# Patient Record
Sex: Male | Born: 1950
Health system: Southern US, Community
[De-identification: ages and names within clinical notes are randomized; demographics above are authoritative.]

## PROBLEM LIST (undated history)

## (undated) DIAGNOSIS — M79609 Pain in unspecified limb: Secondary | ICD-10-CM

## (undated) DIAGNOSIS — D689 Coagulation defect, unspecified: Secondary | ICD-10-CM

## (undated) DIAGNOSIS — L989 Disorder of the skin and subcutaneous tissue, unspecified: Secondary | ICD-10-CM

## (undated) DIAGNOSIS — Z21 Asymptomatic human immunodeficiency virus [HIV] infection status: Secondary | ICD-10-CM

## (undated) DIAGNOSIS — K219 Gastro-esophageal reflux disease without esophagitis: Secondary | ICD-10-CM

## (undated) DIAGNOSIS — G35 Multiple sclerosis: Secondary | ICD-10-CM

## (undated) DIAGNOSIS — A4902 Methicillin resistant Staphylococcus aureus infection, unspecified site: Secondary | ICD-10-CM

## (undated) DIAGNOSIS — N390 Urinary tract infection, site not specified: Secondary | ICD-10-CM

## (undated) DIAGNOSIS — Z8619 Personal history of other infectious and parasitic diseases: Secondary | ICD-10-CM

## (undated) DIAGNOSIS — I739 Peripheral vascular disease, unspecified: Secondary | ICD-10-CM

## (undated) DIAGNOSIS — J189 Pneumonia, unspecified organism: Secondary | ICD-10-CM

## (undated) DIAGNOSIS — K635 Polyp of colon: Secondary | ICD-10-CM

## (undated) DIAGNOSIS — F32A Depression, unspecified: Secondary | ICD-10-CM

## (undated) DIAGNOSIS — J45909 Unspecified asthma, uncomplicated: Secondary | ICD-10-CM

## (undated) DIAGNOSIS — K8689 Other specified diseases of pancreas: Secondary | ICD-10-CM

## (undated) DIAGNOSIS — G35D Multiple sclerosis, unspecified: Secondary | ICD-10-CM

## (undated) DIAGNOSIS — D759 Disease of blood and blood-forming organs, unspecified: Secondary | ICD-10-CM

## (undated) DIAGNOSIS — IMO0002 Reserved for concepts with insufficient information to code with codable children: Secondary | ICD-10-CM

## (undated) DIAGNOSIS — I6529 Occlusion and stenosis of unspecified carotid artery: Secondary | ICD-10-CM

## (undated) DIAGNOSIS — B182 Chronic viral hepatitis C: Secondary | ICD-10-CM

## (undated) DIAGNOSIS — E785 Hyperlipidemia, unspecified: Secondary | ICD-10-CM

## (undated) DIAGNOSIS — F329 Major depressive disorder, single episode, unspecified: Secondary | ICD-10-CM

## (undated) DIAGNOSIS — Z972 Presence of dental prosthetic device (complete) (partial): Secondary | ICD-10-CM

## (undated) DIAGNOSIS — B59 Pneumocystosis: Secondary | ICD-10-CM

## (undated) DIAGNOSIS — R220 Localized swelling, mass and lump, head: Secondary | ICD-10-CM

## (undated) DIAGNOSIS — I10 Essential (primary) hypertension: Secondary | ICD-10-CM

## (undated) DIAGNOSIS — B20 Human immunodeficiency virus [HIV] disease: Secondary | ICD-10-CM

## (undated) DIAGNOSIS — R509 Fever, unspecified: Secondary | ICD-10-CM

## (undated) HISTORY — DX: Pneumocystosis: B59

## (undated) HISTORY — DX: Localized swelling, mass and lump, head: R22.0

## (undated) HISTORY — DX: Major depressive disorder, single episode, unspecified: F32.9

## (undated) HISTORY — DX: Multiple sclerosis: G35

## (undated) HISTORY — DX: Other specified diseases of pancreas: K86.89

## (undated) HISTORY — DX: Disorder of the skin and subcutaneous tissue, unspecified: L98.9

## (undated) HISTORY — PX: OTHER SURGICAL HISTORY: SHX169

## (undated) HISTORY — DX: Methicillin resistant Staphylococcus aureus infection, unspecified site: A49.02

## (undated) HISTORY — DX: Essential (primary) hypertension: I10

## (undated) HISTORY — DX: Chronic viral hepatitis C: B18.2

## (undated) HISTORY — DX: Peripheral vascular disease, unspecified: I73.9

## (undated) HISTORY — DX: Asymptomatic human immunodeficiency virus (hiv) infection status: Z21

## (undated) HISTORY — DX: Personal history of other infectious and parasitic diseases: Z86.19

## (undated) HISTORY — DX: Polyp of colon: K63.5

## (undated) HISTORY — DX: Hyperlipidemia, unspecified: E78.5

## (undated) HISTORY — PX: ABOVE KNEE LEG AMPUTATION: SUR20

## (undated) HISTORY — DX: Gastro-esophageal reflux disease without esophagitis: K21.9

## (undated) HISTORY — DX: Depression, unspecified: F32.A

## (undated) HISTORY — DX: Urinary tract infection, site not specified: N39.0

## (undated) HISTORY — PX: COLONOSCOPY W/ BIOPSIES AND POLYPECTOMY: SHX1376

## (undated) HISTORY — DX: Pneumonia, unspecified organism: J18.9

## (undated) HISTORY — DX: Pain in unspecified limb: M79.609

## (undated) HISTORY — DX: Multiple sclerosis, unspecified: G35.D

## (undated) HISTORY — DX: Reserved for concepts with insufficient information to code with codable children: IMO0002

## (undated) HISTORY — DX: Human immunodeficiency virus (HIV) disease: B20

## (undated) HISTORY — DX: Coagulation defect, unspecified: D68.9

## (undated) HISTORY — DX: Unspecified asthma, uncomplicated: J45.909

## (undated) HISTORY — DX: Fever, unspecified: R50.9

## (undated) HISTORY — PX: MULTIPLE TOOTH EXTRACTIONS: SHX2053

## (undated) HISTORY — DX: Occlusion and stenosis of unspecified carotid artery: I65.29

---

## 1992-07-06 ENCOUNTER — Encounter (INDEPENDENT_AMBULATORY_CARE_PROVIDER_SITE_OTHER): Payer: Self-pay | Admitting: *Deleted

## 1997-08-14 ENCOUNTER — Emergency Department (HOSPITAL_COMMUNITY): Admission: EM | Admit: 1997-08-14 | Discharge: 1997-08-14 | Payer: Self-pay | Admitting: Emergency Medicine

## 1997-08-19 ENCOUNTER — Encounter: Admission: RE | Admit: 1997-08-19 | Discharge: 1997-08-19 | Payer: Self-pay | Admitting: Infectious Diseases

## 1997-11-19 ENCOUNTER — Encounter: Admission: RE | Admit: 1997-11-19 | Discharge: 1997-11-19 | Payer: Self-pay | Admitting: Hematology and Oncology

## 1998-02-09 ENCOUNTER — Ambulatory Visit (HOSPITAL_COMMUNITY): Admission: RE | Admit: 1998-02-09 | Discharge: 1998-02-09 | Payer: Self-pay | Admitting: Infectious Diseases

## 1998-04-12 ENCOUNTER — Encounter: Admission: RE | Admit: 1998-04-12 | Discharge: 1998-04-12 | Payer: Self-pay | Admitting: Infectious Diseases

## 1998-08-11 ENCOUNTER — Encounter: Admission: RE | Admit: 1998-08-11 | Discharge: 1998-08-11 | Payer: Self-pay | Admitting: Infectious Diseases

## 1998-10-06 ENCOUNTER — Encounter: Admission: RE | Admit: 1998-10-06 | Discharge: 1998-10-06 | Payer: Self-pay | Admitting: Infectious Diseases

## 1998-10-27 ENCOUNTER — Encounter: Admission: RE | Admit: 1998-10-27 | Discharge: 1998-10-27 | Payer: Self-pay | Admitting: Infectious Diseases

## 1998-10-27 ENCOUNTER — Encounter: Payer: Self-pay | Admitting: Infectious Diseases

## 1998-10-27 ENCOUNTER — Ambulatory Visit (HOSPITAL_COMMUNITY): Admission: RE | Admit: 1998-10-27 | Discharge: 1998-10-27 | Payer: Self-pay | Admitting: Infectious Diseases

## 1998-11-04 ENCOUNTER — Ambulatory Visit (HOSPITAL_COMMUNITY): Admission: RE | Admit: 1998-11-04 | Discharge: 1998-11-04 | Payer: Self-pay | Admitting: Gastroenterology

## 1998-11-04 ENCOUNTER — Encounter: Payer: Self-pay | Admitting: Gastroenterology

## 1998-12-01 ENCOUNTER — Encounter: Admission: RE | Admit: 1998-12-01 | Discharge: 1998-12-01 | Payer: Self-pay | Admitting: Infectious Diseases

## 1998-12-27 ENCOUNTER — Encounter: Admission: RE | Admit: 1998-12-27 | Discharge: 1998-12-27 | Payer: Self-pay | Admitting: Infectious Diseases

## 1998-12-27 ENCOUNTER — Inpatient Hospital Stay (HOSPITAL_COMMUNITY): Admission: AD | Admit: 1998-12-27 | Discharge: 1999-01-03 | Payer: Self-pay | Admitting: Internal Medicine

## 1998-12-27 ENCOUNTER — Encounter: Payer: Self-pay | Admitting: Internal Medicine

## 1999-01-17 ENCOUNTER — Emergency Department (HOSPITAL_COMMUNITY): Admission: EM | Admit: 1999-01-17 | Discharge: 1999-01-17 | Payer: Self-pay | Admitting: Emergency Medicine

## 1999-01-17 ENCOUNTER — Encounter: Payer: Self-pay | Admitting: Emergency Medicine

## 1999-02-04 ENCOUNTER — Encounter: Admission: RE | Admit: 1999-02-04 | Discharge: 1999-02-04 | Payer: Self-pay | Admitting: Hematology and Oncology

## 1999-02-07 ENCOUNTER — Encounter: Admission: RE | Admit: 1999-02-07 | Discharge: 1999-02-07 | Payer: Self-pay | Admitting: Infectious Diseases

## 1999-02-09 ENCOUNTER — Encounter: Admission: RE | Admit: 1999-02-09 | Discharge: 1999-02-09 | Payer: Self-pay | Admitting: Infectious Diseases

## 1999-02-15 ENCOUNTER — Encounter: Admission: RE | Admit: 1999-02-15 | Discharge: 1999-02-15 | Payer: Self-pay | Admitting: Infectious Diseases

## 1999-02-28 ENCOUNTER — Encounter: Admission: RE | Admit: 1999-02-28 | Discharge: 1999-02-28 | Payer: Self-pay | Admitting: Infectious Diseases

## 1999-03-30 ENCOUNTER — Encounter: Admission: RE | Admit: 1999-03-30 | Discharge: 1999-03-30 | Payer: Self-pay | Admitting: Infectious Diseases

## 1999-03-30 ENCOUNTER — Ambulatory Visit (HOSPITAL_COMMUNITY): Admission: RE | Admit: 1999-03-30 | Discharge: 1999-03-30 | Payer: Self-pay | Admitting: Infectious Diseases

## 1999-04-13 ENCOUNTER — Encounter: Admission: RE | Admit: 1999-04-13 | Discharge: 1999-04-13 | Payer: Self-pay | Admitting: Infectious Diseases

## 1999-05-16 ENCOUNTER — Encounter: Admission: RE | Admit: 1999-05-16 | Discharge: 1999-05-16 | Payer: Self-pay | Admitting: Infectious Diseases

## 1999-05-16 ENCOUNTER — Ambulatory Visit (HOSPITAL_COMMUNITY): Admission: RE | Admit: 1999-05-16 | Discharge: 1999-05-16 | Payer: Self-pay | Admitting: Infectious Diseases

## 1999-06-13 ENCOUNTER — Encounter: Admission: RE | Admit: 1999-06-13 | Discharge: 1999-06-13 | Payer: Self-pay | Admitting: Infectious Diseases

## 2005-03-27 ENCOUNTER — Ambulatory Visit (HOSPITAL_COMMUNITY): Admission: RE | Admit: 2005-03-27 | Discharge: 2005-03-27 | Payer: Self-pay | Admitting: Infectious Diseases

## 2005-03-27 ENCOUNTER — Ambulatory Visit: Payer: Self-pay | Admitting: Infectious Diseases

## 2005-04-10 ENCOUNTER — Ambulatory Visit: Payer: Self-pay | Admitting: Infectious Diseases

## 2005-06-21 ENCOUNTER — Ambulatory Visit: Payer: Self-pay | Admitting: Infectious Diseases

## 2005-06-21 ENCOUNTER — Encounter (INDEPENDENT_AMBULATORY_CARE_PROVIDER_SITE_OTHER): Payer: Self-pay | Admitting: *Deleted

## 2005-07-24 ENCOUNTER — Ambulatory Visit: Payer: Self-pay | Admitting: Infectious Diseases

## 2005-09-12 ENCOUNTER — Ambulatory Visit: Payer: Self-pay | Admitting: Infectious Diseases

## 2005-10-16 ENCOUNTER — Ambulatory Visit: Payer: Self-pay | Admitting: Infectious Diseases

## 2006-01-10 ENCOUNTER — Encounter (INDEPENDENT_AMBULATORY_CARE_PROVIDER_SITE_OTHER): Payer: Self-pay | Admitting: *Deleted

## 2006-01-10 ENCOUNTER — Ambulatory Visit: Payer: Self-pay | Admitting: Infectious Diseases

## 2006-01-10 LAB — CONVERTED CEMR LAB: CD4 Count: 500 microliters

## 2006-02-12 ENCOUNTER — Ambulatory Visit: Payer: Self-pay | Admitting: Infectious Diseases

## 2006-02-12 DIAGNOSIS — B20 Human immunodeficiency virus [HIV] disease: Secondary | ICD-10-CM | POA: Insufficient documentation

## 2006-02-12 DIAGNOSIS — D126 Benign neoplasm of colon, unspecified: Secondary | ICD-10-CM

## 2006-02-12 DIAGNOSIS — I739 Peripheral vascular disease, unspecified: Secondary | ICD-10-CM | POA: Insufficient documentation

## 2006-02-12 DIAGNOSIS — K219 Gastro-esophageal reflux disease without esophagitis: Secondary | ICD-10-CM | POA: Insufficient documentation

## 2006-02-12 DIAGNOSIS — G35 Multiple sclerosis: Secondary | ICD-10-CM

## 2006-02-12 DIAGNOSIS — G35D Multiple sclerosis, unspecified: Secondary | ICD-10-CM

## 2006-02-12 DIAGNOSIS — R768 Other specified abnormal immunological findings in serum: Secondary | ICD-10-CM

## 2006-02-12 HISTORY — DX: Multiple sclerosis, unspecified: G35.D

## 2006-04-12 ENCOUNTER — Encounter (INDEPENDENT_AMBULATORY_CARE_PROVIDER_SITE_OTHER): Payer: Self-pay | Admitting: Infectious Diseases

## 2006-06-01 ENCOUNTER — Ambulatory Visit: Payer: Self-pay | Admitting: Infectious Diseases

## 2006-06-01 ENCOUNTER — Encounter (INDEPENDENT_AMBULATORY_CARE_PROVIDER_SITE_OTHER): Payer: Self-pay | Admitting: *Deleted

## 2006-06-01 ENCOUNTER — Encounter: Admission: RE | Admit: 2006-06-01 | Discharge: 2006-06-01 | Payer: Self-pay | Admitting: Infectious Diseases

## 2006-06-01 LAB — CONVERTED CEMR LAB
ALT: 15 units/L (ref 0–53)
Albumin: 4.7 g/dL (ref 3.5–5.2)
Alkaline Phosphatase: 79 units/L (ref 39–117)
BUN: 10 mg/dL (ref 6–23)
Calcium: 9.6 mg/dL (ref 8.4–10.5)
Eosinophils Relative: 3 % (ref 0–5)
HCT: 43.9 % (ref 39.0–52.0)
HDL: 42 mg/dL (ref >39)
HIV 1 RNA Quant: <50 copies/mL (ref <50)
MCV: 114.6 fL — ABNORMAL HIGH (ref 78.0–100.0)
Monocytes Absolute: 0.5 10*3/uL (ref 0.2–0.7)
Neutro Abs: 4.9 10*3/uL (ref 1.7–7.7)
Neutrophils Relative %: 61 % (ref 43–77)
Potassium: 4.2 meq/L (ref 3.5–5.3)
RBC: 3.83 M/uL — ABNORMAL LOW (ref 4.22–5.81)
Sodium: 140 meq/L (ref 135–145)
Total CHOL/HDL Ratio: 5.7
Total Protein: 7 g/dL (ref 6.0–8.3)
Triglycerides: 184 mg/dL — ABNORMAL HIGH (ref <150)
VLDL: 37 mg/dL (ref 0–40)
WBC: 8 10*3/uL (ref 4.0–10.5)

## 2006-07-02 ENCOUNTER — Encounter (INDEPENDENT_AMBULATORY_CARE_PROVIDER_SITE_OTHER): Payer: Self-pay | Admitting: *Deleted

## 2006-07-02 LAB — CONVERTED CEMR LAB

## 2006-07-15 ENCOUNTER — Encounter (INDEPENDENT_AMBULATORY_CARE_PROVIDER_SITE_OTHER): Payer: Self-pay | Admitting: *Deleted

## 2006-07-16 ENCOUNTER — Ambulatory Visit: Payer: Self-pay | Admitting: Infectious Diseases

## 2006-08-27 ENCOUNTER — Ambulatory Visit: Payer: Self-pay | Admitting: Infectious Diseases

## 2006-10-22 ENCOUNTER — Ambulatory Visit: Payer: Self-pay | Admitting: Infectious Diseases

## 2006-12-19 ENCOUNTER — Ambulatory Visit: Payer: Self-pay | Admitting: Infectious Diseases

## 2006-12-19 ENCOUNTER — Encounter: Payer: Self-pay | Admitting: Infectious Disease

## 2006-12-19 LAB — CONVERTED CEMR LAB
ALT: 14 units/L (ref 0–53)
Albumin: 4.6 g/dL (ref 3.5–5.2)
Cholesterol: 206 mg/dL — ABNORMAL HIGH (ref 0–200)
Creatinine, Ser: 0.89 mg/dL (ref 0.40–1.50)
Glucose, Bld: 122 mg/dL — ABNORMAL HIGH (ref 70–99)
HDL: 40 mg/dL (ref >39)
LDL Cholesterol: 123 mg/dL — ABNORMAL HIGH (ref 0–99)
Total Bilirubin: 0.5 mg/dL (ref 0.3–1.2)
Total Protein: 7 g/dL (ref 6.0–8.3)
Triglycerides: 217 mg/dL — ABNORMAL HIGH (ref <150)
VLDL: 43 mg/dL — ABNORMAL HIGH (ref 0–40)

## 2006-12-27 ENCOUNTER — Encounter: Payer: Self-pay | Admitting: Infectious Disease

## 2006-12-27 LAB — CONVERTED CEMR LAB: CD4 Count: 529 microliters

## 2007-01-21 ENCOUNTER — Telehealth: Payer: Self-pay | Admitting: Infectious Disease

## 2007-01-23 ENCOUNTER — Encounter: Payer: Self-pay | Admitting: Infectious Disease

## 2007-01-23 ENCOUNTER — Ambulatory Visit: Payer: Self-pay | Admitting: Internal Medicine

## 2007-01-23 DIAGNOSIS — M79609 Pain in unspecified limb: Secondary | ICD-10-CM

## 2007-01-30 ENCOUNTER — Telehealth: Payer: Self-pay | Admitting: Internal Medicine

## 2007-01-30 ENCOUNTER — Encounter: Payer: Self-pay | Admitting: Internal Medicine

## 2007-01-30 ENCOUNTER — Ambulatory Visit (HOSPITAL_COMMUNITY): Admission: RE | Admit: 2007-01-30 | Discharge: 2007-01-30 | Payer: Self-pay | Admitting: Internal Medicine

## 2007-01-30 ENCOUNTER — Ambulatory Visit: Payer: Self-pay | Admitting: *Deleted

## 2007-01-31 ENCOUNTER — Telehealth (INDEPENDENT_AMBULATORY_CARE_PROVIDER_SITE_OTHER): Payer: Self-pay | Admitting: *Deleted

## 2007-02-08 ENCOUNTER — Ambulatory Visit: Payer: Self-pay | Admitting: Vascular Surgery

## 2007-02-08 ENCOUNTER — Encounter: Payer: Self-pay | Admitting: Internal Medicine

## 2007-02-12 ENCOUNTER — Ambulatory Visit (HOSPITAL_COMMUNITY): Admission: RE | Admit: 2007-02-12 | Discharge: 2007-02-12 | Payer: Self-pay | Admitting: Surgery

## 2007-02-18 ENCOUNTER — Ambulatory Visit: Payer: Self-pay | Admitting: Infectious Disease

## 2007-02-18 ENCOUNTER — Ambulatory Visit: Payer: Self-pay | Admitting: Surgery

## 2007-02-18 DIAGNOSIS — L738 Other specified follicular disorders: Secondary | ICD-10-CM | POA: Insufficient documentation

## 2007-02-22 ENCOUNTER — Ambulatory Visit: Payer: Self-pay | Admitting: Vascular Surgery

## 2007-02-26 ENCOUNTER — Encounter: Payer: Self-pay | Admitting: Vascular Surgery

## 2007-02-26 ENCOUNTER — Inpatient Hospital Stay (HOSPITAL_COMMUNITY): Admission: RE | Admit: 2007-02-26 | Discharge: 2007-03-01 | Payer: Self-pay | Admitting: Vascular Surgery

## 2007-02-27 ENCOUNTER — Ambulatory Visit: Payer: Self-pay | Admitting: Vascular Surgery

## 2007-03-15 ENCOUNTER — Ambulatory Visit: Payer: Self-pay | Admitting: Vascular Surgery

## 2007-03-22 ENCOUNTER — Ambulatory Visit: Payer: Self-pay | Admitting: Vascular Surgery

## 2007-04-26 ENCOUNTER — Encounter: Payer: Self-pay | Admitting: Internal Medicine

## 2007-04-26 ENCOUNTER — Ambulatory Visit: Payer: Self-pay | Admitting: Vascular Surgery

## 2007-05-07 ENCOUNTER — Encounter: Payer: Self-pay | Admitting: Internal Medicine

## 2007-05-07 ENCOUNTER — Encounter (INDEPENDENT_AMBULATORY_CARE_PROVIDER_SITE_OTHER): Payer: Self-pay | Admitting: *Deleted

## 2007-05-09 HISTORY — PX: ABOVE KNEE LEG AMPUTATION: SUR20

## 2007-05-20 ENCOUNTER — Ambulatory Visit: Payer: Self-pay | Admitting: Surgery

## 2007-05-27 ENCOUNTER — Encounter: Payer: Self-pay | Admitting: Infectious Disease

## 2007-05-27 ENCOUNTER — Ambulatory Visit: Payer: Self-pay | Admitting: Infectious Diseases

## 2007-05-27 LAB — CONVERTED CEMR LAB
Basophils Absolute: 0 10*3/uL (ref 0.0–0.1)
Basophils Relative: 1 % (ref 0–1)
Calcium: 9.7 mg/dL (ref 8.4–10.5)
Eosinophils Relative: 3 % (ref 0–5)
LDL Cholesterol: 122 mg/dL — ABNORMAL HIGH (ref 0–99)
Lymphocytes Relative: 44 % (ref 12–46)
MCV: 116.1 fL — ABNORMAL HIGH (ref 78.0–100.0)
Monocytes Absolute: 0.6 10*3/uL (ref 0.1–1.0)
Neutrophils Relative %: 42 % — ABNORMAL LOW (ref 43–77)
Platelets: 207 10*3/uL (ref 150–400)
RBC: 3.84 M/uL — ABNORMAL LOW (ref 4.22–5.81)
Total CHOL/HDL Ratio: 4.8
Triglycerides: 155 mg/dL — ABNORMAL HIGH (ref <150)

## 2007-07-02 ENCOUNTER — Ambulatory Visit: Payer: Self-pay | Admitting: Vascular Surgery

## 2007-07-02 ENCOUNTER — Encounter: Payer: Self-pay | Admitting: Internal Medicine

## 2007-07-02 ENCOUNTER — Ambulatory Visit: Payer: Self-pay | Admitting: Infectious Disease

## 2007-07-03 ENCOUNTER — Encounter: Payer: Self-pay | Admitting: Internal Medicine

## 2007-07-29 ENCOUNTER — Encounter (INDEPENDENT_AMBULATORY_CARE_PROVIDER_SITE_OTHER): Payer: Self-pay | Admitting: *Deleted

## 2007-07-29 ENCOUNTER — Ambulatory Visit: Payer: Self-pay | Admitting: Infectious Disease

## 2007-07-29 DIAGNOSIS — E785 Hyperlipidemia, unspecified: Secondary | ICD-10-CM

## 2007-07-29 DIAGNOSIS — E1169 Type 2 diabetes mellitus with other specified complication: Secondary | ICD-10-CM | POA: Insufficient documentation

## 2007-08-01 ENCOUNTER — Encounter (INDEPENDENT_AMBULATORY_CARE_PROVIDER_SITE_OTHER): Payer: Self-pay | Admitting: *Deleted

## 2007-09-17 ENCOUNTER — Telehealth (INDEPENDENT_AMBULATORY_CARE_PROVIDER_SITE_OTHER): Payer: Self-pay | Admitting: *Deleted

## 2007-11-01 ENCOUNTER — Ambulatory Visit: Payer: Self-pay | Admitting: Vascular Surgery

## 2007-11-04 ENCOUNTER — Ambulatory Visit: Payer: Self-pay | Admitting: Surgery

## 2007-11-06 ENCOUNTER — Inpatient Hospital Stay (HOSPITAL_COMMUNITY): Admission: RE | Admit: 2007-11-06 | Discharge: 2007-12-04 | Payer: Self-pay | Admitting: Surgery

## 2007-11-06 ENCOUNTER — Ambulatory Visit: Payer: Self-pay | Admitting: Surgery

## 2007-11-11 ENCOUNTER — Encounter: Payer: Self-pay | Admitting: Surgery

## 2007-11-13 ENCOUNTER — Encounter: Payer: Self-pay | Admitting: Surgery

## 2007-11-14 ENCOUNTER — Encounter: Payer: Self-pay | Admitting: Surgery

## 2007-11-15 ENCOUNTER — Encounter: Payer: Self-pay | Admitting: Surgery

## 2007-11-22 ENCOUNTER — Encounter: Payer: Self-pay | Admitting: Surgery

## 2007-11-25 ENCOUNTER — Ambulatory Visit: Payer: Self-pay | Admitting: Physical Medicine & Rehabilitation

## 2007-12-01 ENCOUNTER — Encounter: Payer: Self-pay | Admitting: Surgery

## 2007-12-16 ENCOUNTER — Ambulatory Visit: Payer: Self-pay | Admitting: Surgery

## 2007-12-17 ENCOUNTER — Telehealth (INDEPENDENT_AMBULATORY_CARE_PROVIDER_SITE_OTHER): Payer: Self-pay | Admitting: *Deleted

## 2007-12-30 ENCOUNTER — Ambulatory Visit: Payer: Self-pay | Admitting: Surgery

## 2008-02-04 ENCOUNTER — Ambulatory Visit: Payer: Self-pay | Admitting: Infectious Disease

## 2008-02-04 LAB — CONVERTED CEMR LAB
ALT: 33 units/L (ref 0–53)
Albumin: 4.4 g/dL (ref 3.5–5.2)
Alkaline Phosphatase: 70 units/L (ref 39–117)
BUN: 5 mg/dL — ABNORMAL LOW (ref 6–23)
CO2: 23 meq/L (ref 19–32)
Chloride: 105 meq/L (ref 96–112)
Cholesterol: 225 mg/dL — ABNORMAL HIGH (ref 0–200)
HCT: 37.2 % — ABNORMAL LOW (ref 39.0–52.0)
HDL: 42 mg/dL (ref >39)
HIV-1 RNA Quant, Log: 2.17 — ABNORMAL HIGH (ref <1.70)
Hemoglobin: 13.5 g/dL (ref 13.0–17.0)
Lymphocytes Relative: 55 % — ABNORMAL HIGH (ref 12–46)
Lymphs Abs: 3.5 10*3/uL (ref 0.7–4.0)
Monocytes Absolute: 0.4 10*3/uL (ref 0.1–1.0)
Neutro Abs: 2.1 10*3/uL (ref 1.7–7.7)
Potassium: 4.1 meq/L (ref 3.5–5.3)
RBC: 3.51 M/uL — ABNORMAL LOW (ref 4.22–5.81)
RDW: 15.6 % — ABNORMAL HIGH (ref 11.5–15.5)
Total Bilirubin: 0.4 mg/dL (ref 0.3–1.2)
Total CHOL/HDL Ratio: 5.4
WBC: 6.5 10*3/uL (ref 4.0–10.5)

## 2008-02-13 ENCOUNTER — Ambulatory Visit (HOSPITAL_COMMUNITY): Admission: RE | Admit: 2008-02-13 | Discharge: 2008-02-13 | Payer: Self-pay | Admitting: Infectious Disease

## 2008-02-13 ENCOUNTER — Ambulatory Visit: Payer: Self-pay | Admitting: Infectious Disease

## 2008-02-13 DIAGNOSIS — M25539 Pain in unspecified wrist: Secondary | ICD-10-CM | POA: Insufficient documentation

## 2008-02-13 DIAGNOSIS — I10 Essential (primary) hypertension: Secondary | ICD-10-CM | POA: Insufficient documentation

## 2008-02-13 LAB — CONVERTED CEMR LAB
BUN: 8 mg/dL (ref 6–23)
Bilirubin Urine: NEGATIVE
CO2: 24 meq/L (ref 19–32)
Calcium: 9.5 mg/dL (ref 8.4–10.5)
Microalb Creat Ratio: 6.4 mg/g (ref 0.0–30.0)
Potassium: 3.9 meq/L (ref 3.5–5.3)
Rhuematoid fact SerPl-aCnc: <20 intl units/mL (ref 0–20)
Sodium: 141 meq/L (ref 135–145)
Specific Gravity, Urine: 1.019 (ref 1.005–1.03)
T3 Uptake Ratio: 28.2 % (ref 22.5–37.0)
T3, Total: 155.9 ng/dL (ref 80.0–204.0)
pH: 5.5 (ref 5.0–8.0)

## 2008-02-14 ENCOUNTER — Encounter: Payer: Self-pay | Admitting: Infectious Disease

## 2008-02-18 ENCOUNTER — Telehealth: Payer: Self-pay | Admitting: Infectious Disease

## 2008-02-24 ENCOUNTER — Ambulatory Visit: Payer: Self-pay | Admitting: Infectious Disease

## 2008-02-24 DIAGNOSIS — R1013 Epigastric pain: Secondary | ICD-10-CM

## 2008-02-24 DIAGNOSIS — K3189 Other diseases of stomach and duodenum: Secondary | ICD-10-CM | POA: Insufficient documentation

## 2008-02-24 LAB — CONVERTED CEMR LAB
BUN: 9 mg/dL (ref 6–23)
Chloride: 102 meq/L (ref 96–112)
Glucose, Bld: 148 mg/dL — ABNORMAL HIGH (ref 70–99)

## 2008-02-26 ENCOUNTER — Encounter: Admission: RE | Admit: 2008-02-26 | Discharge: 2008-05-07 | Payer: Self-pay | Admitting: Surgery

## 2008-03-23 ENCOUNTER — Ambulatory Visit: Payer: Self-pay | Admitting: Surgery

## 2008-04-08 ENCOUNTER — Ambulatory Visit: Payer: Self-pay | Admitting: Infectious Disease

## 2008-04-08 LAB — CONVERTED CEMR LAB
Alkaline Phosphatase: 76 units/L (ref 39–117)
BUN: 10 mg/dL (ref 6–23)
Basophils Relative: 0 % (ref 0–1)
CO2: 24 meq/L (ref 19–32)
Chloride: 105 meq/L (ref 96–112)
Eosinophils Absolute: 0.3 10*3/uL (ref 0.0–0.7)
Glucose, Bld: 117 mg/dL — ABNORMAL HIGH (ref 70–99)
HDL: 36 mg/dL — ABNORMAL LOW (ref >39)
HIV-1 RNA Quant, Log: 2.31 — ABNORMAL HIGH (ref <1.68)
Hemoglobin: 13.8 g/dL (ref 13.0–17.0)
Lymphocytes Relative: 55 % — ABNORMAL HIGH (ref 12–46)
MCV: 110.1 fL — ABNORMAL HIGH (ref 78.0–100.0)
Monocytes Absolute: 0.5 10*3/uL (ref 0.1–1.0)
Monocytes Relative: 7 % (ref 3–12)
Neutrophils Relative %: 34 % — ABNORMAL LOW (ref 43–77)
Platelets: 204 10*3/uL (ref 150–400)
Potassium: 4.7 meq/L (ref 3.5–5.3)
RBC: 3.65 M/uL — ABNORMAL LOW (ref 4.22–5.81)
Sodium: 139 meq/L (ref 135–145)
Total CHOL/HDL Ratio: 3.2
Triglycerides: 76 mg/dL (ref <150)
VLDL: 15 mg/dL (ref 0–40)
WBC: 7.5 10*3/uL (ref 4.0–10.5)

## 2008-05-12 ENCOUNTER — Encounter: Admission: RE | Admit: 2008-05-12 | Discharge: 2008-08-10 | Payer: Self-pay | Admitting: Surgery

## 2008-05-28 ENCOUNTER — Ambulatory Visit: Payer: Self-pay | Admitting: Infectious Disease

## 2008-05-28 ENCOUNTER — Encounter: Payer: Self-pay | Admitting: Internal Medicine

## 2008-05-28 LAB — CONVERTED CEMR LAB
AST: 36 units/L (ref 0–37)
Albumin: 4.7 g/dL (ref 3.5–5.2)
BUN: 9 mg/dL (ref 6–23)
Calcium: 9.6 mg/dL (ref 8.4–10.5)
Cholesterol: 128 mg/dL (ref 0–200)
HDL: 35 mg/dL — ABNORMAL LOW (ref >39)
Hepatitis B Surface Ag: NEGATIVE
LDL Cholesterol: 71 mg/dL (ref 0–99)
Potassium: 4 meq/L (ref 3.5–5.3)
Sodium: 138 meq/L (ref 135–145)
Total Bilirubin: 0.8 mg/dL (ref 0.3–1.2)
Total CHOL/HDL Ratio: 3.7

## 2008-06-03 ENCOUNTER — Ambulatory Visit: Payer: Self-pay | Admitting: Vascular Surgery

## 2008-06-03 ENCOUNTER — Encounter: Payer: Self-pay | Admitting: Infectious Disease

## 2008-06-08 ENCOUNTER — Ambulatory Visit: Payer: Self-pay | Admitting: Surgery

## 2008-06-11 ENCOUNTER — Ambulatory Visit: Payer: Self-pay | Admitting: *Deleted

## 2008-06-11 ENCOUNTER — Ambulatory Visit: Payer: Self-pay | Admitting: Infectious Disease

## 2008-06-11 LAB — CONVERTED CEMR LAB
CO2: 22 meq/L (ref 19–32)
Chloride: 103 meq/L (ref 96–112)
Glucose, Bld: 164 mg/dL — ABNORMAL HIGH (ref 70–99)
Potassium: 4.1 meq/L (ref 3.5–5.3)
Sodium: 137 meq/L (ref 135–145)

## 2008-07-02 ENCOUNTER — Ambulatory Visit (HOSPITAL_COMMUNITY): Admission: RE | Admit: 2008-07-02 | Discharge: 2008-07-03 | Payer: Self-pay | Admitting: Surgery

## 2008-07-02 ENCOUNTER — Ambulatory Visit: Payer: Self-pay | Admitting: Surgery

## 2008-07-15 ENCOUNTER — Ambulatory Visit: Payer: Self-pay | Admitting: Vascular Surgery

## 2008-08-03 ENCOUNTER — Ambulatory Visit: Payer: Self-pay | Admitting: Surgery

## 2008-08-11 ENCOUNTER — Encounter: Admission: RE | Admit: 2008-08-11 | Discharge: 2008-11-09 | Payer: Self-pay | Admitting: Surgery

## 2008-09-28 ENCOUNTER — Ambulatory Visit: Payer: Self-pay | Admitting: Surgery

## 2008-10-01 ENCOUNTER — Ambulatory Visit: Payer: Self-pay | Admitting: Infectious Disease

## 2008-10-01 LAB — CONVERTED CEMR LAB
Cholesterol, target level: 200 mg/dL
LDL Goal: 100 mg/dL

## 2008-10-20 ENCOUNTER — Encounter (INDEPENDENT_AMBULATORY_CARE_PROVIDER_SITE_OTHER): Payer: Self-pay | Admitting: *Deleted

## 2008-11-13 ENCOUNTER — Ambulatory Visit: Payer: Self-pay | Admitting: Internal Medicine

## 2008-11-13 ENCOUNTER — Encounter: Payer: Self-pay | Admitting: Infectious Disease

## 2008-11-13 LAB — CONVERTED CEMR LAB
ALT: 37 units/L (ref 0–53)
AST: 25 units/L (ref 0–37)
BUN: 10 mg/dL (ref 6–23)
Calcium: 9.4 mg/dL (ref 8.4–10.5)
Creatinine, Ser: 0.86 mg/dL (ref 0.40–1.50)
Sodium: 140 meq/L (ref 135–145)
Total CHOL/HDL Ratio: 3.6
Total Protein: 6.9 g/dL (ref 6.0–8.3)
Triglycerides: 78 mg/dL (ref <150)
VLDL: 16 mg/dL (ref 0–40)

## 2008-12-31 ENCOUNTER — Encounter (INDEPENDENT_AMBULATORY_CARE_PROVIDER_SITE_OTHER): Payer: Self-pay | Admitting: Internal Medicine

## 2009-02-01 ENCOUNTER — Ambulatory Visit: Payer: Self-pay | Admitting: Surgery

## 2009-02-25 ENCOUNTER — Ambulatory Visit: Payer: Self-pay | Admitting: Infectious Disease

## 2009-02-25 LAB — CONVERTED CEMR LAB
Albumin: 4.7 g/dL (ref 3.5–5.2)
Alkaline Phosphatase: 79 units/L (ref 39–117)
BUN: 9 mg/dL (ref 6–23)
Calcium: 9.7 mg/dL (ref 8.4–10.5)
Creatinine, Ser: 0.94 mg/dL (ref 0.40–1.50)
Eosinophils Absolute: 0.3 10*3/uL (ref 0.0–0.7)
HCT: 40.5 % (ref 39.0–52.0)
HIV 1 RNA Quant: 92 copies/mL — ABNORMAL HIGH (ref <48)
Hemoglobin: 13.5 g/dL (ref 13.0–17.0)
Monocytes Absolute: 0.4 10*3/uL (ref 0.1–1.0)
Monocytes Relative: 7 % (ref 3–12)
Neutro Abs: 2.1 10*3/uL (ref 1.7–7.7)
Platelets: 183 10*3/uL (ref 150–400)
Potassium: 4.5 meq/L (ref 3.5–5.3)
RBC: 3.48 M/uL — ABNORMAL LOW (ref 4.22–5.81)
RDW: 13.7 % (ref 11.5–15.5)
Total Bilirubin: 0.8 mg/dL (ref 0.3–1.2)
Total CHOL/HDL Ratio: 4.4
VLDL: 28 mg/dL (ref 0–40)
WBC: 6 10*3/uL (ref 4.0–10.5)

## 2009-04-05 ENCOUNTER — Encounter: Payer: Self-pay | Admitting: Infectious Disease

## 2009-04-05 ENCOUNTER — Ambulatory Visit: Payer: Self-pay | Admitting: Infectious Disease

## 2009-04-05 DIAGNOSIS — E162 Hypoglycemia, unspecified: Secondary | ICD-10-CM

## 2009-04-05 DIAGNOSIS — E11649 Type 2 diabetes mellitus with hypoglycemia without coma: Secondary | ICD-10-CM | POA: Insufficient documentation

## 2009-04-05 DIAGNOSIS — E1142 Type 2 diabetes mellitus with diabetic polyneuropathy: Secondary | ICD-10-CM

## 2009-04-05 DIAGNOSIS — E1151 Type 2 diabetes mellitus with diabetic peripheral angiopathy without gangrene: Secondary | ICD-10-CM | POA: Insufficient documentation

## 2009-04-05 LAB — CONVERTED CEMR LAB
Creatinine, Urine: 19.4 mg/dL
Microalb, Ur: 0.5 mg/dL (ref 0.00–1.89)

## 2009-04-06 ENCOUNTER — Telehealth (INDEPENDENT_AMBULATORY_CARE_PROVIDER_SITE_OTHER): Payer: Self-pay | Admitting: *Deleted

## 2009-04-21 ENCOUNTER — Encounter: Payer: Self-pay | Admitting: Infectious Disease

## 2009-04-26 ENCOUNTER — Ambulatory Visit: Payer: Self-pay | Admitting: Internal Medicine

## 2009-04-26 LAB — CONVERTED CEMR LAB

## 2009-04-29 ENCOUNTER — Encounter: Payer: Self-pay | Admitting: Internal Medicine

## 2009-05-24 ENCOUNTER — Ambulatory Visit: Payer: Self-pay | Admitting: Surgery

## 2009-05-26 ENCOUNTER — Ambulatory Visit: Payer: Self-pay | Admitting: Internal Medicine

## 2009-05-26 ENCOUNTER — Encounter: Payer: Self-pay | Admitting: Infectious Disease

## 2009-05-27 ENCOUNTER — Encounter: Payer: Self-pay | Admitting: Infectious Disease

## 2009-06-01 ENCOUNTER — Encounter: Payer: Self-pay | Admitting: Infectious Disease

## 2009-06-10 ENCOUNTER — Telehealth (INDEPENDENT_AMBULATORY_CARE_PROVIDER_SITE_OTHER): Payer: Self-pay | Admitting: *Deleted

## 2009-06-10 ENCOUNTER — Encounter: Admission: RE | Admit: 2009-06-10 | Discharge: 2009-09-08 | Payer: Self-pay | Admitting: Surgery

## 2009-06-17 ENCOUNTER — Encounter (INDEPENDENT_AMBULATORY_CARE_PROVIDER_SITE_OTHER): Payer: Self-pay | Admitting: *Deleted

## 2009-06-23 ENCOUNTER — Telehealth (INDEPENDENT_AMBULATORY_CARE_PROVIDER_SITE_OTHER): Payer: Self-pay | Admitting: *Deleted

## 2009-07-05 ENCOUNTER — Ambulatory Visit: Payer: Self-pay | Admitting: Infectious Disease

## 2009-07-05 LAB — CONVERTED CEMR LAB
AST: 24 units/L (ref 0–37)
Albumin: 4.7 g/dL (ref 3.5–5.2)
BUN: 8 mg/dL (ref 6–23)
CD4 Count: 568 microliters
Calcium: 9.8 mg/dL (ref 8.4–10.5)
Chloride: 105 meq/L (ref 96–112)
Creatinine, Ser: 0.92 mg/dL (ref 0.40–1.50)
Creatinine, Urine: 122.7 mg/dL
Glucose, Bld: 165 mg/dL — ABNORMAL HIGH (ref 70–99)
HDL: 33 mg/dL — ABNORMAL LOW (ref >39)
Potassium: 4.5 meq/L (ref 3.5–5.3)
Total CHOL/HDL Ratio: 3.7
Triglycerides: 121 mg/dL (ref <150)

## 2009-07-14 ENCOUNTER — Telehealth: Payer: Self-pay | Admitting: Infectious Disease

## 2009-07-14 ENCOUNTER — Ambulatory Visit: Payer: Self-pay | Admitting: Internal Medicine

## 2009-07-19 ENCOUNTER — Telehealth (INDEPENDENT_AMBULATORY_CARE_PROVIDER_SITE_OTHER): Payer: Self-pay | Admitting: *Deleted

## 2009-07-20 ENCOUNTER — Encounter: Payer: Self-pay | Admitting: Infectious Disease

## 2009-07-27 ENCOUNTER — Encounter: Payer: Self-pay | Admitting: Infectious Disease

## 2009-08-02 ENCOUNTER — Ambulatory Visit: Payer: Self-pay | Admitting: Sports Medicine

## 2009-08-02 DIAGNOSIS — Z89619 Acquired absence of unspecified leg above knee: Secondary | ICD-10-CM | POA: Insufficient documentation

## 2009-08-09 ENCOUNTER — Telehealth: Payer: Self-pay | Admitting: Infectious Disease

## 2009-08-11 ENCOUNTER — Telehealth (INDEPENDENT_AMBULATORY_CARE_PROVIDER_SITE_OTHER): Payer: Self-pay | Admitting: *Deleted

## 2009-09-14 ENCOUNTER — Encounter: Admission: RE | Admit: 2009-09-14 | Discharge: 2009-12-13 | Payer: Self-pay | Admitting: Surgery

## 2009-10-11 ENCOUNTER — Encounter (INDEPENDENT_AMBULATORY_CARE_PROVIDER_SITE_OTHER): Payer: Self-pay | Admitting: *Deleted

## 2009-10-11 ENCOUNTER — Ambulatory Visit: Payer: Self-pay | Admitting: Infectious Disease

## 2009-10-11 ENCOUNTER — Encounter: Payer: Self-pay | Admitting: Infectious Disease

## 2009-10-11 DIAGNOSIS — M25529 Pain in unspecified elbow: Secondary | ICD-10-CM

## 2009-10-11 LAB — CONVERTED CEMR LAB
ALT: 26 units/L (ref 0–53)
AST: 24 units/L (ref 0–37)
Albumin: 4.4 g/dL (ref 3.5–5.2)
Alkaline Phosphatase: 71 units/L (ref 39–117)
CD4 Count: 669 microliters
Cholesterol, target level: 200 mg/dL
Glucose, Bld: 120 mg/dL — ABNORMAL HIGH (ref 70–99)
HDL goal, serum: 40 mg/dL
HIV 1 RNA Quant: 39 copies/mL
LDL Cholesterol: 69 mg/dL (ref 0–99)
LDL Goal: 70 mg/dL
Potassium: 4.6 meq/L (ref 3.5–5.3)
Sodium: 139 meq/L (ref 135–145)
Total Bilirubin: 0.4 mg/dL (ref 0.3–1.2)
Total Protein: 6.9 g/dL (ref 6.0–8.3)
Triglycerides: 78 mg/dL (ref <150)
VLDL: 16 mg/dL (ref 0–40)

## 2009-10-14 ENCOUNTER — Telehealth (INDEPENDENT_AMBULATORY_CARE_PROVIDER_SITE_OTHER): Payer: Self-pay | Admitting: *Deleted

## 2009-11-29 ENCOUNTER — Ambulatory Visit: Payer: Self-pay | Admitting: Surgery

## 2009-12-01 ENCOUNTER — Encounter: Payer: Self-pay | Admitting: Infectious Disease

## 2009-12-13 ENCOUNTER — Telehealth: Payer: Self-pay | Admitting: Infectious Disease

## 2009-12-21 ENCOUNTER — Ambulatory Visit: Payer: Self-pay | Admitting: Infectious Disease

## 2009-12-21 LAB — CONVERTED CEMR LAB
AST: 36 units/L (ref 0–37)
Albumin: 4.6 g/dL (ref 3.5–5.2)
Alkaline Phosphatase: 62 units/L (ref 39–117)
Basophils Relative: 0 % (ref 0–1)
Calcium: 9.4 mg/dL (ref 8.4–10.5)
Chloride: 100 meq/L (ref 96–112)
Eosinophils Absolute: 0.2 10*3/uL (ref 0.0–0.7)
Glucose, Bld: 200 mg/dL — ABNORMAL HIGH (ref 70–99)
LDL Cholesterol: 57 mg/dL (ref 0–99)
Lymphs Abs: 3.2 10*3/uL (ref 0.7–4.0)
MCV: 116.8 fL — ABNORMAL HIGH (ref 78.0–100.0)
Neutro Abs: 1.9 10*3/uL (ref 1.7–7.7)
Neutrophils Relative %: 33 % — ABNORMAL LOW (ref 43–77)
Platelets: 198 10*3/uL (ref 150–400)
Potassium: 4.3 meq/L (ref 3.5–5.3)
RBC: 3.34 M/uL — ABNORMAL LOW (ref 4.22–5.81)
Sodium: 135 meq/L (ref 135–145)
Total Protein: 6.6 g/dL (ref 6.0–8.3)
WBC: 5.7 10*3/uL (ref 4.0–10.5)

## 2010-02-03 ENCOUNTER — Encounter: Payer: Self-pay | Admitting: Infectious Disease

## 2010-02-04 ENCOUNTER — Encounter: Payer: Self-pay | Admitting: Infectious Disease

## 2010-03-15 ENCOUNTER — Ambulatory Visit: Payer: Self-pay | Admitting: Infectious Disease

## 2010-03-15 ENCOUNTER — Ambulatory Visit: Payer: Self-pay | Admitting: Adult Health

## 2010-03-15 ENCOUNTER — Ambulatory Visit (HOSPITAL_COMMUNITY): Admission: RE | Admit: 2010-03-15 | Discharge: 2010-03-15 | Payer: Self-pay | Admitting: Infectious Diseases

## 2010-03-15 LAB — CONVERTED CEMR LAB
ALT: 48 units/L (ref 0–53)
Albumin: 5.1 g/dL (ref 3.5–5.2)
CD4 Count: 653 microliters
CO2: 24 meq/L (ref 19–32)
Calcium: 9.6 mg/dL (ref 8.4–10.5)
Chloride: 100 meq/L (ref 96–112)
Cholesterol: 129 mg/dL (ref 0–200)
Glucose, Bld: 222 mg/dL — ABNORMAL HIGH (ref 70–99)
HIV 1 RNA Quant: 39 copies/mL
Sodium: 136 meq/L (ref 135–145)
Total Protein: 6.8 g/dL (ref 6.0–8.3)

## 2010-03-22 ENCOUNTER — Encounter: Payer: Self-pay | Admitting: Infectious Disease

## 2010-04-12 ENCOUNTER — Ambulatory Visit: Payer: Self-pay | Admitting: Infectious Disease

## 2010-04-12 LAB — CONVERTED CEMR LAB
Chlamydia, Swab/Urine, PCR: NEGATIVE
Creatinine, Urine: 52.8 mg/dL
GC Probe Amp, Urine: NEGATIVE
Hepatitis B Surface Ag: NEGATIVE

## 2010-05-30 ENCOUNTER — Encounter: Payer: Self-pay | Admitting: *Deleted

## 2010-06-05 LAB — CONVERTED CEMR LAB
ALT: 14 units/L (ref 0–53)
ALT: 20 units/L (ref 0–53)
AST: 18 units/L (ref 0–37)
CD4 Count: 886 microliters
CO2: 25 meq/L (ref 19–32)
CO2: 26 meq/L (ref 19–32)
Chloride: 103 meq/L (ref 96–112)
Chloride: 108 meq/L (ref 96–112)
Cholesterol: 206 mg/dL — ABNORMAL HIGH (ref 0–200)
Glucose, Bld: 90 mg/dL (ref 70–99)
HDL: 43 mg/dL (ref >39)
HIV 1 RNA Quant: 39 copies/mL
HIV 1 RNA Quant: 49 copies/mL
Hep A Total Ab: POSITIVE — AB
Hep B Core Total Ab: POSITIVE — AB
LDL Cholesterol: 119 mg/dL — ABNORMAL HIGH (ref 0–99)
LDL Cholesterol: 153 mg/dL — ABNORMAL HIGH (ref 0–99)
Potassium: 3.8 meq/L (ref 3.5–5.3)
Sodium: 141 meq/L (ref 135–145)
Sodium: 142 meq/L (ref 135–145)
Total Bilirubin: 0.4 mg/dL (ref 0.3–1.2)
Total Bilirubin: 0.6 mg/dL (ref 0.3–1.2)
Total Protein, Urine: 2
Total Protein: 7.8 g/dL (ref 6.0–8.3)
Triglycerides: 127 mg/dL (ref <150)
Triglycerides: 239 mg/dL — ABNORMAL HIGH (ref <150)
VLDL: 25 mg/dL (ref 0–40)

## 2010-06-09 NOTE — Miscellaneous (Signed)
Summary: Orders Update - future labs  Clinical Lists Changes  Orders: Added new Test order of T-CBC w/Diff [phone]) - Signed Added new Test order of T-CD4SP [facility]) (CD4SP) - Signed Added new Test order of T-Comprehensive Metabolic Panel [id number]) - Signed Added new Test order of T-HIV Viral Load [phone]) - Signed Added new Test order of T-RPR (Syphilis) ([id number]) - Signed

## 2010-06-09 NOTE — Consult Note (Signed)
Summary: [facility]  [facility]   Imported By: Florinda Marker 04/26/2010 10:03:35  _____________________________________________________________________  External Attachment:    Type:   Image     Comment:   External Document

## 2010-06-09 NOTE — Miscellaneous (Signed)
Summary: HIV-1 RNA, CD4 (RESEARCH)  Clinical Lists Changes  Observations: Added new observation of CD4 COUNT: 653 microliters (03/15/2010 16:32) Added new observation of HIV1RNA QA: 39 copies/mL (03/15/2010 16:32)

## 2010-06-09 NOTE — Consult Note (Signed)
Summary: [facility]: Diabetic Eye Exam  [facility]: Diabetic Eye Exam   Imported By: Florinda Marker 05/18/2009 14:08:49  _____________________________________________________________________  External Attachment:    Type:   Image     Comment:   External Document  Appended Document: [facility]: Diabetic Eye Exam    Clinical Lists Changes  Observations: Added new observation of DMEYEEXAMNXT: 04/2010 (05/19/2009 13:00) Added new observation of DIAB EYE EX: No diabetic retinopathy.   Cataract.    (04/21/2009 13:01)       Diabetic Eye Exam  Procedure date:  04/21/2009  Findings:      No diabetic retinopathy.   Cataract.     Procedures Next Due Date:    Diabetic Eye Exam: 04/2010   Diabetic Eye Exam  Procedure date:  04/21/2009  Findings:      No diabetic retinopathy.   Cataract.     Procedures Next Due Date:    Diabetic Eye Exam: 04/2010

## 2010-06-09 NOTE — Medication Information (Signed)
Summary: [facility] RX  [facility]   Imported By: Florinda Marker 02/10/2010 09:24:57  _____________________________________________________________________  External Attachment:    Type:   Image     Comment:   External Document

## 2010-06-09 NOTE — Miscellaneous (Signed)
Summary: Diabetes self mangment referral/dmr  Clinical Lists Changes  Orders: Added new Test order of T-Hgb A1C (in-house) [phone]) - Signed Observations: Added new observation of OTHER COMMEN: HIV (05/26/2009 14:35) Added new observation of DIAB COMPLIC: Other (45/40/9811 14:35) Added new observation of DIABETIC ED: Initial Diabetes Self management Training  (05/26/2009 14:35) Added new observation of [employer]: requiring 1:1 training: difficulty with ambulation and transportation, other comorbidities requiring diabetes training to be done in office (05/26/2009 14:35) Added new observation of PF BAR OTH: Other (05/26/2009 14:35) Added new observation of PMH DYSLIPID: Dyslipidemia (05/26/2009 14:35) Added new observation of HTN: HTN (05/26/2009 14:35) Added new observation of HX OF PVD: PVD (05/26/2009 14:35) Added new observation of AMPUTATIONHX: Amputation (05/26/2009 14:35) Added new observation of DMMONITORGLS: Monitoring (05/26/2009 14:35)      Diabetes Self Management Training Referral Patient Name: Nathan [name] Date Of Birth: 21-Dec-1950 MRN: [id number] Current Diagnosis:  FOOT PAIN, LEFT (ICD-729.5) DIABETES MELLITUS, TYPE II, UNCONTROLLED (ICD-250.02) HYPOGLYCEMIA, UNSPECIFIED (ICD-251.2) DYSPEPSIA (ICD-536.8) HYPERTENSION NEC (ICD-997.91) WRIST PAIN, BILATERAL (ICD-719.43) PREVENTIVE HEALTH CARE (ICD-V70.0) HYPERLIPIDEMIA (ICD-272.4) FOLLICULITIS (ICD-704.8) FOOT PAIN, RIGHT (ICD-729.5) * RECURRENT STAPHYLOCOCCAL SKIN ABSCESSES * PROBABLE PERINEAL CELLULITIS/PERIRECTAL ABSCESS COLONIC POLYPS, ADENOMATOUS (ICD-211.3) MULTIPLE SCLEROSIS (ICD-340) * PENILE ULCERATIONS * PERINEAL LESION PERIPHERAL VASCULAR DISEASE (ICD-443.9) HIV DISEASE (ICD-042) HEPATITIS B, HX OF (ICD-V12.09) GERD (ICD-530.81)     Management Training Needs:   Initial Diabetes Self management Training   Monitoring  Complicating Conditions:  HTN  Dyslipidemia  PVD  Amputation  Other  HIV  Barriers:  Other  requiring 1:1 training: difficulty with ambulation and transportation, other comorbidities requiring diabetes training to be done in office

## 2010-06-09 NOTE — Assessment & Plan Note (Signed)
Summary: LABS    Current Allergies: ! SULFA  Other Orders: Est. Patient Research Study [phone]) T-Comprehensive Metabolic Panel [id number]) T-Lipid Profile [phone])  CD4 and VL, cmet and lipids drawn for the ALLRT study. Jeray will return in a few weeks to complete the evaluations when he sees Dr. Quillian Quince RN  March 15, 2010 12:39 PM

## 2010-06-09 NOTE — Assessment & Plan Note (Signed)
Summary: STUDY APPT/ LH    Current Allergies: ! SULFA  Other Orders: T-Hgb A1C (in-house) ([id number]) Est. Patient Research Study [phone]) T-Comprehensive Metabolic Panel [phone]) T-Lipid Profile [id number])  Process Orders Check Orders Results:     [facility]: ABN not required for this insurance Order queued for requisitioning for [facility]: October 11, 2009 11:04 AM  Tests Sent for requisitioning (October 11, 2009 11:04 AM):     10/11/2009: [facility] -- T-Comprehensive Metabolic Panel [[id number]] (signed)     10/11/2009: [facility] -- T-Lipid Profile [phone] (signed)    Process Orders Check Orders Results:     [facility]: ABN not required for this insurance Order queued for requisitioning for [facility]: October 11, 2009 11:04 AM  Tests Sent for requisitioning (October 11, 2009 11:04 AM):     10/11/2009: [facility] -- T-Comprehensive Metabolic Panel [[id number]] (signed)     10/11/2009: [facility] -- T-Lipid Profile [id number] (signed)

## 2010-06-09 NOTE — Miscellaneous (Signed)
Summary: [facility]: Medical Case [facility]gt.  [facility]: Medical Case [facility]   Imported By: Florinda Marker 06/02/2009 14:32:04  _____________________________________________________________________  External Attachment:    Type:   Image     Comment:   External Document

## 2010-06-09 NOTE — Medication Information (Signed)
Summary: [facility] RX  [facility] RX   Imported By: Florinda Marker 02/10/2010 09:25:34  _____________________________________________________________________  External Attachment:    Type:   Image     Comment:   External Document

## 2010-06-09 NOTE — Miscellaneous (Signed)
Summary: HIV-1 RNA, CD4 (RESEARCH)  Clinical Lists Changes  Observations: Added new observation of CD4 COUNT: 669 microliters (10/11/2009 14:58) Added new observation of HIV1RNA QA: 39 copies/mL (10/11/2009 14:58)

## 2010-06-09 NOTE — Progress Notes (Signed)
Summary: diabettes support/dmr  Phone Note Outgoing Call   Call placed by: Jamison Neighbor RD,CDE,  June 10, 2009 9:24 AM Summary of Call: patient left two voicemail messages asking for a prescription for diabetic shoes and inserts. Signed certification and prescription mailed to patient today along with appointment card for next Diabetes Self Managment Training appointment on 06/16/09.

## 2010-06-09 NOTE — Consult Note (Signed)
Summary: [facility]   [facility]   Imported By: Florinda Marker 04/19/2010 10:50:08  _____________________________________________________________________  External Attachment:    Type:   Image     Comment:   External Document

## 2010-06-09 NOTE — Assessment & Plan Note (Signed)
Summary: STUDY APPT/ LH    Current Allergies: ! SULFA Vital Signs:  Patient profile:   60 year old male Weight:      170.75 pounds (77.61 kg) BMI:     22.61 Temp:     97.5 degrees F oral Pulse rate:   65 / minute BP sitting:   125 / 76  (left arm) Is Patient Diabetic? No Pain Assessment Patient in pain? no      Nutritional Status BMI of 19 -24 = normal  Does patient need assistance? Functional Status Self care Ambulation Normal   Patient here for week 656 ALLRT study visit. He denies any new problems. He says that the pain in his rt stump is decreasing and he is not having to use as much pain medicine. His left calf pain has also stopped. he will return in December for the next appt.Deirdre Evener RN  December 21, 2009 10:23 AM    Other Orders: Est. Patient Research Study [phone]) T-CBC w/Diff [phone]) T-Lipid Profile [phone]) T-Comprehensive Metabolic Panel [phone])

## 2010-06-09 NOTE — Progress Notes (Signed)
Summary: diabetes support/dmr  Phone Note Outgoing Call   Call placed by: Jamison Neighbor RD,CDE,  October 14, 2009 2:53 PM Summary of Call: called patient to commend him on his work to acheive his blood glucose target- 6% or average of 126mg /dl. He wanted to get A1C to 6.5% range and has met his goal. Left message and encouraged him to follow up 6-12 months for DSMT/MNT and more often if needed.

## 2010-06-09 NOTE — Assessment & Plan Note (Signed)
Summary: dm training/vs   Vital Signs:  Patient profile:   60 year old male Weight:      170 pounds BMI:     22.51 Is Patient Diabetic? Yes Did you bring your meter with you today? Yes Comments weight is with his temporary 7.75 pound prosthesis.    Allergies: 1)  ! Sulfa   Complete Medication List: 1)  Viread 300 Mg Tabs (Tenofovir disoproxil fumarate) .... Once a day 2)  Promethazine Hcl 25 Mg Tabs (Promethazine hcl) .... Take one to two tablets every 4 hours as needed for nausea 3)  Lipitor 40 Mg Tabs (Atorvastatin calcium) .... Take 1 tablet by mouth once a day 4)  Isentress 400 Mg Tabs (Raltegravir potassium) .... Take 1 tablet by mouth two times a day 5)  Lisinopril 40 Mg Tabs (Lisinopril) .... Take 1 tablet by mouth once a day 6)  Omeprazole 40 Mg Cpdr (Omeprazole) .... Take 1 tablet by mouth once a day 7)  Plavix 75 Mg Tabs (Clopidogrel bisulfate) .Marland Kitchen.. 1 by mouth daily 8)  Oxycodone Hcl 10 Mg Tabs (Oxycodone hcl) .... Take one to two tablets up to threee times a day as needed for pain 9)  Combivir 150-300 Mg Tabs (Lamivudine-zidovudine) .... Take 1 tablet by mouth two times a day 10)  Metformin Hcl 500 Mg Tabs (Metformin hcl) .... Take 1 tablet by mouth two times a day for 2 weeks then two tablets twice daily 11)  Lancets Misc (Lancets) .... Use to test blood sugar once daily 12)  Freestyle Lite Test Strp (Glucose blood) .... Use to test blood sugar once daily 13)  Lantus Solostar 100 Unit/ml Soln (Insulin glargine) .... Inject 10 units subcutaneously each evenign about the same time 14)  Bd Pen Needle Nano U/f 32g X 4 Mm Misc (Insulin pen needle) .... Use to inject insulin one time a day  Other Orders: DSMT(Medicare) Individual, 30 Minutes ([id number])  Patient Instructions: 1)  inject 10 units lantus at the same time every night. 2)  check blood sugar every monring 3)  call me in 1-2 weeks to discuss blood sugars/insulin effect 4)  Keep insulin in use out of the refrig for  28 days, keep unsused insulin in frig.  Diabetes Self Management Training  PCP: Paulette Blanch Dam MD Referring MD: Paulette Blanch Dam Date diagnosed with diabetes: 04/05/2009 Diabetes Type: Type 2 non-insulin Other persons present: no Current smoking Status: never  Vital Signs Todays Weight: 170lb  in BMI 22.51in-lbs   Assessment Work Hours: Not currently working  Diabetes Medications:  Lipid lowering Meds? Yes Comments: reviewed CBgs on meter: takig 200 mg metfomrins/day now for > 1 month and tryign to lower carbs. lowest was 150 and most were high 100s before eating. patuiwent desires lower blood sugar and i agree. we dioscussed options. he desires to start lantus insulin. He gave himself and injection of saline today in office without probelm and repeated demonstration on how to use the lantus solastar insuln pen.aslso desire to test more frequeslty because he is very concerned nad it helps him know how to self manage.     Monitoring Self monitoring blood glucose 2 times a day Name of Meter  Freedom Lite  Recent Episodes of: Requiring Help from another person  Hyperglycemia : Yes Hypoglycemia: No Severe Hypoglycemia : No      Nutrition assessment ETOH : No Do you read food labels?  Yes What do you look at?                                                                                                                 eatign reasonable amounts and spreading carbs out over day to the best of his ability Diabetes Disease Process  Discussed today  Medications State name-action-dose-duration-side effects-and time to take medication: Demonstrates competency   State appropriate timing of food related to medication: Demonstrates competency   Demonstrates/verbalizes site selection and rotation for injections Demonstrates competency   Correctly draw up and administer insulin-Byetta-Symlin-glucagon:  Demonstrates competency   State insulin adjustment guidelines: Not applicable   Describe safe needle/lancet disposal: Demonstrates competency Nutritional Management Identify what foods most often affect blood glucose: Demonstrates competencyVerbalize importance of controlling food portions: Demonstrates competencyState importance of spacing and not omitting meals and snacks: Demonstrates competencyState changes planned for home meals/snacks: Needs review/assistance    Monitoring State purpose and frequency of monitoring BG-ketones-HgbA1C  : Designer, multimedia target blood glucose and HgbA1C goals: Demonstrates competency    Complications State the causes- signs and symptoms and prevention of hypoglycemia: Needs review/assistance   Explain proper treatment of hypoglycemia: Needs review/assistance    Exercise Diabetes Management Education Done: 07/14/2009    BEHAVIORAL GOALS INITIAL Utilizing medications if for therapeutic effectiveness: see instructions        Needs A1C at follow-up.  Patient desires nad would receommend a1c target 6.5%  Diabetes Self Management Support: clinic staff, brother nad sister in lawt.  Patient prefers to obtain Diabetes self mangment training here due to difficulty with ambulation. Follow-up:4 weeks

## 2010-06-09 NOTE — Assessment & Plan Note (Signed)
Summary: STUDY APPT/ LH    Current Allergies: ! SULFA Vital Signs:  Patient profile:   60 year old male Weight:      169.1 pounds (76.86 kg) BMI:     22.39 Temp:     97.4 degrees F oral Pulse rate:   67 / minute BP sitting:   121 / 72  (right arm) Is Patient Diabetic? Yes Did you bring your meter with you today? No Research Study Name: ALLRT Pain Assessment Patient in pain? no      Nutritional Status BMI of 19 -24 = normal  Does patient need assistance? Functional Status Self care Ambulation Normal   Patient here for week 624 ALLRT visit. He is doing well, without any new complaints. He did run out of test strips and found out that Medicare would not cover more than 30 strips/ month, so he hasn't checked his sugar lately. He has an appt with Dr. Daiva Eves in June.Deirdre Evener RN  July 05, 2009 10:08 AM    Other Orders: Est. Patient Research Study [phone]) T-Comprehensive Metabolic Panel [id number]) T-Lipid Profile [id number]) T-Urine Protein [id number]) T-Urine Creatinine [id number]) Process Orders Check Orders Results:     [facility]: ABN not required for this insurance Tests Sent for requisitioning (July 05, 2009 10:01 AM):     07/05/2009: [facility] -- T-Comprehensive Metabolic Panel [[id number]] (signed)     07/05/2009: [facility] -- T-Lipid Profile [phone] (signed)     07/05/2009: [facility] -- T-Urine Protein [id number] (signed)     07/05/2009: [facility] -- T-Urine Creatinine [[id number]] (signed)

## 2010-06-09 NOTE — Letter (Signed)
Summary: Pharmacologist   Imported By: Florinda Marker 05/27/2009 14:49:18  _____________________________________________________________________  External Attachment:    Type:   Image     Comment:   External Document

## 2010-06-09 NOTE — Assessment & Plan Note (Signed)
Summary: STUDY APPT/ LH    Current Allergies: ! SULFA  Other Orders: Est. Patient Research Study [phone]) T-Urine Protein [id number]) T-Urine Creatinine [id number]) T-Hepatitis C Antibody ([id number]) T-Hepatitis B Surface Antigen [id number])

## 2010-06-09 NOTE — Miscellaneous (Signed)
Summary: clinical update/ryan white [place]ADAP approved til 08/06/10  Clinical Lists Changes  Observations: Added new observation of AIDSDAP: Yes 2011 (06/17/2009 16:35)

## 2010-06-09 NOTE — Assessment & Plan Note (Signed)
Summary: U/S RT KNEE AMPUTATION,QUESTIONS STUMP NEUROMA PER Keara Pagliarulo,MC   Vital Signs:  Patient profile:   60 year old male Height:      73 inches Weight:      170 pounds BP sitting:   139 / 81  Vitals Entered By: Lillia Pauls CMA (August 02, 2009 10:18 AM)  History of Present Illness: RT leg amputated 18 mos ago blot clots led to ischemia and ultimate amputation had to revise after BKA to an AKA amputation  now with any but the old prosthesis he gets cramping sensation in back of leg Causes terrible pain - 10/10 can't put any weight down  PT - Robin -asked if we would try to [place] to see if we can spot a stump neuroma or other cause for this pain  currently being fitted for new leg for better fit    Allergies: 1)  ! Sulfa  Physical Exam  General:  Well-developed,well-nourished,in no acute distress; alert,appropriate and cooperative throughout examination Msk:  AK amputation stump this shows medial scar small quad small HS mm noted no pain noted on palpation today Additional Exam:  MSK [place] Scans of stump completed lat aspect normal superior aspect is normal Medial aspect shows a circular calcified structure no doppler flow in this but could be old calcified vessel with no flow vs focal calcification around Nerve  more significantlhy distal and inferior stump show a calcium deposit of about 1 cm noted on long and trans scan this is in the distal HS attachment and that is MM that tends to spasm  images saved   Impression & Recommendations:  Problem # 1:  LEG PAIN, RIGHT (ICD-729.5)  This pain is intermittent and related to prosthesis wear It occurs over inferior and distal stump area creates what eh describes as a sever cramp  This corresponds to a calcified deposit in the same area I suspect that is trigger point for his pain  Orders: [place] LIMITED ([id number])  Problem # 2:  AKA, RIGHT, HX OF (ICD-V49.76)  needs prosthttic change that does not trigger pain  will  discuss with PT about padding to protect this area  Orders: [place] LIMITED ([id number])  Complete Medication List: 1)  Viread 300 Mg Tabs (Tenofovir disoproxil fumarate) .... Once a day 2)  Promethazine Hcl 25 Mg Tabs (Promethazine hcl) .... Take one to two tablets every 4 hours as needed for nausea 3)  Lipitor 40 Mg Tabs (Atorvastatin calcium) .... Take 1 tablet by mouth once a day 4)  Isentress 400 Mg Tabs (Raltegravir potassium) .... Take 1 tablet by mouth two times a day 5)  Lisinopril 40 Mg Tabs (Lisinopril) .... Take 1 tablet by mouth once a day 6)  Omeprazole 40 Mg Cpdr (Omeprazole) .... Take 1 tablet by mouth once a day 7)  Plavix 75 Mg Tabs (Clopidogrel bisulfate) .Marland Kitchen.. 1 by mouth daily 8)  Oxycodone Hcl 10 Mg Tabs (Oxycodone hcl) .... Take one to two tablets up to threee times a day as needed for pain 9)  Combivir 150-300 Mg Tabs (Lamivudine-zidovudine) .... Take 1 tablet by mouth two times a day 10)  Metformin Hcl 500 Mg Tabs (Metformin hcl) .... Take 1 tablet by mouth two times a day for 2 weeks then two tablets twice daily 11)  Lancets Misc (Lancets) .... Use to test blood sugar three times a day (250.00) 12)  Freestyle Lite Test Strp (Glucose blood) .... Use to test blood sugar three times a day (250.00) 13)  Lantus Solostar  100 Unit/ml Soln (Insulin glargine) .... Inject 10 units subcutaneously each evening about the same time, increase 1 unit each day  until goal fasting blood sugar reached x 2 days, then hold at that dose 14)  Bd Pen Needle Nano U/f 32g X 4 Mm Misc (Insulin pen needle) .... Use to inject insulin one time a day (250.00)

## 2010-06-09 NOTE — Progress Notes (Signed)
Summary: diabetes supplies and insulin/dmr  Phone Note Outgoing Call   Call placed by: Jamison Neighbor RD,CDE,  July 14, 2009 6:03 PM Summary of Call: patient needs prescriptions for lantus solstar insulin and pen needles sent to [facility] mail order and [facility] respectively.    New/Updated Medications: LANCETS  MISC (LANCETS) use to test blood sugar three times a day (250.00) FREESTYLE LITE TEST  STRP (GLUCOSE BLOOD) use to test blood sugar three times a day (250.00) LANTUS SOLOSTAR 100 UNIT/ML SOLN (INSULIN GLARGINE) inject 10 units subcutaneously each evening about the same time BD PEN NEEDLE NANO U/F 32G X 4 MM MISC (INSULIN PEN NEEDLE) use to inject insulin one time a day (250.00) Prescriptions: LANTUS SOLOSTAR 100 UNIT/ML SOLN (INSULIN GLARGINE) inject 10 units subcutaneously each evening about the same time  #1 box x 1   Entered by:   Jamison Neighbor RD,CDE   Authorized by:   Acey Lav MD   Signed by:   Paulette Blanch Dam MD on 07/14/2009   Method used:   Electronically to        [facility]* (mail-order)             ,          Ph: [phone]       Fax: [phone]   RxID:   [id number] BD PEN NEEDLE NANO U/F 32G X 4 MM MISC (INSULIN PEN NEEDLE) use to inject insulin one time a day (250.00)  #100 x 3   Entered by:   Jamison Neighbor RD,CDE   Authorized by:   Acey Lav MD   Signed by:   Paulette Blanch Dam MD on 07/14/2009   Method used:   Print then Give to Patient   RxID:   [id number] LANCETS  MISC (LANCETS) use to test blood sugar three times a day (250.00)  #300 x 3   Entered by:   Jamison Neighbor RD,CDE   Authorized by:   Acey Lav MD   Signed by:   Paulette Blanch Dam MD on 07/14/2009   Method used:   Print then Give to Patient   RxID:   [id number] FREESTYLE LITE TEST  STRP (GLUCOSE BLOOD) use to test blood sugar three times a day (250.00)  #300 x 3   Entered by:   Jamison Neighbor RD,CDE   Authorized by:   Acey Lav MD   Signed by:   Paulette Blanch Dam MD on 07/14/2009   Method used:   Print then Give to Patient   RxID:   [id number]   Appended Document: resent diabetes supplies to [facility] Prescriptions: BD PEN NEEDLE NANO U/F 32G X 4 MM MISC (INSULIN PEN NEEDLE) use to inject insulin one time a day (250.00)  #100 x 3   Entered by:   Jennet Maduro RN   Authorized by:   Acey Lav MD   Signed by:   Jennet Maduro RN on 07/19/2009   Method used:   Electronically to        [facility]* (mail-order)             ,          Ph: [phone]       Fax: [phone]   RxID:   [id number] FREESTYLE LITE TEST  STRP (GLUCOSE BLOOD) use to test blood sugar three times a day (250.00)  #300 x 3   Entered by:   Jennet Maduro RN   Authorized by:  Acey Lav MD   Signed by:   Jennet Maduro RN on 07/19/2009   Method used:   Electronically to        [facility] [facility]* (mail-order)             ,          Ph: [phone]       Fax: [phone]   RxID:   [id number] LANCETS  MISC (LANCETS) use to test blood sugar three times a day (250.00)  #300 x 3   Entered by:   Jennet Maduro RN   Authorized by:   Acey Lav MD   Signed by:   Jennet Maduro RN on 07/19/2009   Method used:   Electronically to        [facility]* (mail-order)             ,          Ph: [phone]       Fax: [phone]   RxID:   [id number]

## 2010-06-09 NOTE — Miscellaneous (Signed)
Summary: HIV-1 RNA, CD4 (RESEARCH)  Clinical Lists Changes  Observations: Added new observation of CD4 COUNT: 568 microliters (07/05/2009 10:31) Added new observation of HIV1RNA QA: 39 copies/mL (07/05/2009 10:31)

## 2010-06-09 NOTE — Assessment & Plan Note (Signed)
Summary: DM TRAINING/VS   Vital Signs:  Patient profile:   60 year old male Weight:      169.1 pounds BMI:     22.39 Is Patient Diabetic? Yes Did you bring your meter with you today? Yes   Allergies: 1)  ! Sulfa   Complete Medication List: 1)  Viread 300 Mg Tabs (Tenofovir disoproxil fumarate) .... Once a day 2)  Promethazine Hcl 25 Mg Tabs (Promethazine hcl) .... Take one to two tablets every 4 hours as needed for nausea 3)  Lipitor 40 Mg Tabs (Atorvastatin calcium) .... Take 1 tablet by mouth once a day 4)  Isentress 400 Mg Tabs (Raltegravir potassium) .... Take 1 tablet by mouth two times a day 5)  Lisinopril 40 Mg Tabs (Lisinopril) .... Take 1 tablet by mouth once a day 6)  Omeprazole 40 Mg Cpdr (Omeprazole) .... Take 1 tablet by mouth once a day 7)  Plavix 75 Mg Tabs (Clopidogrel bisulfate) .Marland Kitchen.. 1 by mouth daily 8)  Oxycodone Hcl 10 Mg Tabs (Oxycodone hcl) .... Take one to two tablets up to threee times a day as needed for pain 9)  Combivir 150-300 Mg Tabs (Lamivudine-zidovudine) .... Take 1 tablet by mouth two times a day 10)  Metformin Hcl 500 Mg Tabs (Metformin hcl) .... Take 1 tablet by mouth two times a day for 2 weeks then two tablets twice daily 11)  Lancets Misc (Lancets) .... Use to test blood sugar once daily 12)  Freestyle Lite Test Strp (Glucose blood) .... Use to test blood sugar once daily  Other Orders: DSMT(Medicare) Individual, 30 Minutes (W1191)  Diabetes Self Management Training  PCP: Cliffton Asters MD Referring MD: Paulette Blanch Dam Date diagnosed with diabetes: 04/05/2009 Diabetes Type: Type 2 non-insulin Other persons present: no Current smoking Status: never  Vital Signs Todays Weight: 169.1lb  in BMI 22.39in-lbs   Diabetes Medications:  Lipid lowering Meds? Yes Comments: out of metformin several days- desires 1000mg  metformin precription. CBgs stil above goal, but are improved. He feels leg limits activity. average on meter 165-170 mg/dl.  flat pattern on high side of target range. Could consider combination mediction such as janumet( expensive) low dose glipizide ER(least expensive)or low dose lantus  if follow-up A1C in not at goal 6.5%?  Long Acting  Insulin Type:,    Monitoring Self monitoring blood glucose 2 times a day Name of Meter  Freedom Lite  Recent Episodes of: Requiring Help from another person  Hyperglycemia : No Hypoglycemia: No Severe Hypoglycemia : No     Estimated /Usual Carb Intake Breakfast # of Carbs/Grams cereal or toast and coffee Lunch # of Carbs/Grams meat, starch and vegetables or sandwich Midafternoon # of Carbs/Grams fruit Bedtime # of Carbs/Grams meat, starch and vegetables  Nutrition assessment Weight change: no What beverages do you drink?  diet drinks, water, coffee  Activity Limitations  Inadequate physical activity  Barriers  Physical limitations Diabetes Disease Process  Discussed today  Medications State name-action-dose-duration-side effects-and time to take medication: Needs review/assistance    Nutritional Management Identify what foods most often affect blood glucose: Needs review/assistance    State importance of spacing and not omitting meals and snacks: Needs review/assistance   State changes planned for home meals/snacks: Needs review/assistance    Monitoring  Complications State the causes-signs and symptoms and prevention of Hyperglycemia: Demonstrates competency   Explain proper treatment of hyperglycemia: Needs review/assistance   State benefits-risks-and options for improving blood sugar control: Demonstrates competencyB/P and lipid control in the  prevention/control of cardiovascular disease: Needs review/assistanceState the principles of skin-dental and foot care: Demonstrates competencyDescribe symptoms of skin and foot problems and describe foot exam: Demonstrates competencyState when to seek medical advice and treatment: Demonstrates  competency Exercise States importance of exercise: Needs review/assistance   States effect of exercise on blood glucose: Needs review/assistance   Verbalizes safety measures for exercise related to diabetes: Needs review/assistanceDiabetes Management Education Done: 05/26/2009 Date of Diabetic Education: 05/26/2009    BEHAVIORAL GOALS INITIAL Incorporating physical activity into lifestyle: try to increase physical activity Incorporating appropriate nutritional management: decrease carb portions while replacing with healthy fats to maintain weight or spread out more    BEHAVIORAL GOAL FOLLOW UP Incorporating appropriate nutritional management: Most of the time  Goal attained      would receommend a1c target 6.5%  Diabetes Self Management Support: clinic staff, brother nad sister in lawt.  Patient prefers to obtain Diabetes self mangment training here due to difficulty with ambulation. Follow-up:4 weeks

## 2010-06-09 NOTE — Miscellaneous (Signed)
  Clinical Lists Changes  Orders: Added new Test order of Nerve Conduction (Nerve Conduction) - Signed

## 2010-06-09 NOTE — Assessment & Plan Note (Signed)
Summary: F/U [MKJ]   Visit Type:  Follow-up Primary Provider:  Paulette Blanch Dam MD  CC:  f/u.  History of Present Illness: 60 yo man with HIV, DM, HTN, PVD who presents for followup in ID clinic. He had recent severe progressive pain in  (L) elbow that has now radiated down (L) forearm and wrist in a "shooting" pain. He was seen by Traci Sermon who obtained xray that was normal and Nerve conduction study that showed finding c/w median neuropathy and carpal tunnel syndrome. He has required up to oxycodone up to 6 per day at times (he also takes this for phantom pain and PVD pain). His a1c is 6.6, he thinks his glucometer is not functioning properly.   Problems Prior to Update: 1)  Elbow Pain, Left  (ICD-719.42) 2)  Elbow Pain, Bilateral  (ICD-719.42) 3)  Leg Pain, Right  (ICD-729.5) 4)  Aka, Right, Hx of  (ICD-V49.76) 5)  Foot Pain, Left  (ICD-729.5) 6)  Diabetes Mellitus, Type II, Uncontrolled  (ICD-250.02) 7)  Hypoglycemia, Unspecified  (ICD-251.2) 8)  Dyspepsia  (ICD-536.8) 9)  Hypertension Nec  (ICD-997.91) 10)  Wrist Pain, Bilateral  (ICD-719.43) 11)  Preventive Health Care  (ICD-V70.0) 12)  Hyperlipidemia  (ICD-272.4) 13)  Folliculitis  (ICD-704.8) 14)  Foot Pain, Right  (ICD-729.5) 15)  Recurrent Staphylococcal Skin Abscesses  () 16)  Probable Perineal Cellulitis/perirectal Abscess  () 17)  Colonic Polyps, Adenomatous  (ICD-211.3) 18)  Multiple Sclerosis  (ICD-340) 19)  Penile Ulcerations  () 20)  Perineal Lesion  () 21)  Peripheral Vascular Disease  (ICD-443.9) 22)  HIV Disease  (ICD-042) 23)  Hepatitis B, Hx of  (ICD-V12.09) 24)  Gerd  (ICD-530.81)  Medications Prior to Update: 1)  Viread 300 Mg Tabs (Tenofovir Disoproxil Fumarate) .... Once A Day 2)  Promethazine Hcl 25 Mg  Tabs (Promethazine Hcl) .... Take One To Two Tablets Every 4 Hours As Needed For Nausea 3)  Lipitor 40 Mg  Tabs (Atorvastatin Calcium) .... Take 1 Tablet By Mouth Once A Day 4)  Isentress 400  Mg Tabs (Raltegravir Potassium) .... Take 1 Tablet By Mouth Two Times A Day 5)  Lisinopril 40 Mg Tabs (Lisinopril) .... Take 1 Tablet By Mouth Once A Day 6)  Omeprazole 40 Mg Cpdr (Omeprazole) .... Take 1 Tablet By Mouth Once A Day 7)  Plavix 75 Mg Tabs (Clopidogrel Bisulfate) .Marland Kitchen.. 1 By Mouth Daily 8)  Oxycodone Hcl 10 Mg Tabs (Oxycodone Hcl) .... Take One To Two Tablets Up To Threee Times A Day As Needed For Pain 9)  Combivir 150-300 Mg Tabs (Lamivudine-Zidovudine) .... Take 1 Tablet By Mouth Two Times A Day 10)  Metformin Hcl 500 Mg Tabs (Metformin Hcl) .... Take 1 Tablet By Mouth Two Times A Day For 2 Weeks Then Two Tablets Twice Daily 11)  Lancets  Misc (Lancets) .... Use To Test Blood Sugar Three Times A Day (250.00) 12)  Freestyle Lite Test  Strp (Glucose Blood) .... Use To Test Blood Sugar Three Times A Day (250.00) 13)  Lantus Solostar 100 Unit/ml Soln (Insulin Glargine) .... Inject 25 Units Subcutaneously Each Evening About The Same Time 14)  Bd Pen Needle Nano U/f 32g X 4 Mm Misc (Insulin Pen Needle) .... Use To Inject Insulin One Time A Day (250.00) 15)  Neurontin 300 Mg Caps (Gabapentin) .... Take 1 Tab By Mouth At Bedtime 16)  Ensure  Liqd (Nutritional Supplements) .... 2 Cases Per Month  Current Medications (verified): 1)  Viread 300 Mg  Tabs (Tenofovir Disoproxil Fumarate) .... Once A Day 2)  Promethazine Hcl 25 Mg  Tabs (Promethazine Hcl) .... Take One To Two Tablets Every 4 Hours As Needed For Nausea 3)  Lipitor 40 Mg  Tabs (Atorvastatin Calcium) .... Take 1 Tablet By Mouth Once A Day 4)  Isentress 400 Mg Tabs (Raltegravir Potassium) .... Take 1 Tablet By Mouth Two Times A Day 5)  Lisinopril 40 Mg Tabs (Lisinopril) .... Take 1 Tablet By Mouth Once A Day 6)  Omeprazole 40 Mg Cpdr (Omeprazole) .... Take 1 Tablet By Mouth Once A Day 7)  Plavix 75 Mg Tabs (Clopidogrel Bisulfate) .Marland Kitchen.. 1 By Mouth Daily 8)  Oxycodone Hcl 10 Mg Tabs (Oxycodone Hcl) .... Take One To Two Tablets Up To  Threee Times A Day As Needed For Pain 9)  Combivir 150-300 Mg Tabs (Lamivudine-Zidovudine) .... Take 1 Tablet By Mouth Two Times A Day 10)  Metformin Hcl 500 Mg Tabs (Metformin Hcl) .... Take 1 Tablet By Mouth Two Times A Day For 2 Weeks Then Two Tablets Twice Daily 11)  Lancets  Misc (Lancets) .... Use To Test Blood Sugar Three Times A Day (250.00) 12)  Freestyle Lite Test  Strp (Glucose Blood) .... Use To Test Blood Sugar Three Times A Day (250.00) 13)  Lantus Solostar 100 Unit/ml Soln (Insulin Glargine) .... Inject 25 Units Subcutaneously Each Evening About The Same Time 14)  Bd Pen Needle Nano U/f 32g X 4 Mm Misc (Insulin Pen Needle) .... Use To Inject Insulin One Time A Day (250.00) 15)  Neurontin 300 Mg Caps (Gabapentin) .... Take 1 Tab By Mouth At Bedtime 16)  Ensure  Liqd (Nutritional Supplements) .... 2 Cases Per Month 17)  Wrist Splint  Misc (Elastic Bandages & Supports) .... Wear At Night and With Physical Activity  Allergies: 1)  ! Sulfa    Current Allergies: ! SULFA Past History:  Past Surgical History: Last updated: 05/28/2008 Right BKA in July 2009  Family History: Last updated: 07/29/2007 no early cad  Social History: Last updated: 02/24/2008 stopped smoking Former Smoker  Risk Factors: Alcohol Use: 0 (03/15/2010) Caffeine Use: coffee, soda 3 per day (03/15/2010) Exercise: no (03/15/2010)  Risk Factors: Smoking Status: quit > 6 months (03/15/2010) Packs/Day: 25 cigs  (03/15/2010) Passive Smoke Exposure: no (03/15/2010)  Past Medical History: GERD Hepatitis B, hx of HIV disease Peripheral vascular disease Dyspepsia Hyperlipidemia Diabetes diagnosed today 04/05/09 Carpal tunnel syndrome  Family History: Reviewed history from 07/29/2007 and no changes required. no early cad  Social History: Reviewed history from 02/24/2008 and no changes required. stopped smoking Former Smoker  Review of Systems  The patient denies anorexia, fever, weight  loss, weight gain, vision loss, decreased hearing, hoarseness, syncope, dyspnea on exertion, peripheral edema, prolonged cough, headaches, hemoptysis, abdominal pain, melena, hematochezia, severe indigestion/heartburn, hematuria, incontinence, genital sores, muscle weakness, suspicious skin lesions, transient blindness, difficulty walking, depression, unusual weight change, abnormal bleeding, and enlarged lymph nodes.    Vital Signs:  Patient profile:   60 year old male Height:      73 inches (185.42 cm) Weight:      172 pounds (78.18 kg) BMI:     22.77 Temp:     97.4 degrees F (36.33 degrees C) oral Pulse rate:   70 / minute BP sitting:   135 / 78  (left arm)  Vitals Entered By: Starleen Arms CMA (April 12, 2010 9:34 AM) CC: f/u  Does patient need assistance? Functional Status Self care Ambulation Normal, Impaired:Risk  for fall Comments prosthetic leg   Physical Exam  General:  Well-developed,well-nourished,in no acute distress; alert,appropriate and cooperative throughout examination Head:  Normocephalic and atraumatic without obvious abnormalities. No apparent alopecia or balding. Eyes:  vision grossly intact, pupils equal, pupils round, and pupils reactive to light.   Ears:  no external deformities.   Nose:  no external deformity and no external erythema.   Mouth:  good dentition, no dental plaque, and pharynx pink and moist.   Neck:  No deformities, masses, or tenderness noted. Lungs:  Normal respiratory effort, chest expands symmetrically. Lungs are clear to auscultation, no crackles or wheezes. Heart:  Normal rate and regular rhythm. S1 and S2 normal without gallop, murmur, click, rub or other extra sounds. Abdomen:  soft, non-tender, normal bowel sounds, and no distention.   Msk:  pt with reproduction of pain with flexion of wrist Extremities:  No clubbing, cyanosis, edema, or deformity noted with normal full range of motion of all joints.   Neurologic:  strength  normal in all extremities (except RLE secondary to amputation).   Skin:  turgor normal and color normal.   Psych:  Oriented X3, memory intact for recent and remote, normally interactive, and good eye contact.     Impression & Recommendations:  Problem # 1:  HIV DISEASE (ICD-042)  Pt supremely well controlled Diagnostics Reviewed:  HIV: HIV positive - not AIDS (02/13/2008)   CD4: 653 (03/15/2010)   WBC: 5.7 (12/21/2009)   Hgb: 13.5 (12/21/2009)   HCT: 39.0 (12/21/2009)   Platelets: 198 (12/21/2009) HIV genotype: TNP (02/04/2008)   HIV-1 RNA: 39 (03/15/2010)   HBSAg: NEG (05/28/2008)  Orders: Est. Patient Level IV (57846)  Orders: Est. Patient Level IV (96295)  Problem # 2:  CARPAL TUNNEL SYNDROME (ICD-354.0) Assessment: New  To wear spllint at night and with activity, may need steroid injeciton then surgeyr if this fails  Orders: Est. Patient Level IV (28413)  Problem # 3:  HYPERTENSION NEC (ICD-997.91)  decent control  Orders: Est. Patient Level IV (24401)  Problem # 4:  FOOT PAIN, LEFT (ICD-729.5) from PVD oxycodone written  Problem # 5:  DIABETES MELLITUS, TYPE II, CONTROLLED (ICD-250.00)  needs fu with optho and donna riley His updated medication list for this problem includes:    Lisinopril 40 Mg Tabs (Lisinopril) .Marland Kitchen... Take 1 tablet by mouth once a day    Metformin Hcl 500 Mg Tabs (Metformin hcl) .Marland Kitchen... Take 1 tablet by mouth two times a day for 2 weeks then two tablets twice daily    Lantus Solostar 100 Unit/ml Soln (Insulin glargine) ..... Inject 25 units subcutaneously each evening about the same time  Labs Reviewed: Creat: 0.88 (03/15/2010)     Last Eye Exam: No diabetic retinopathy.   Cataract.    (04/21/2009) Reviewed HgBA1c results: 6.6 (03/15/2010)  6.0 (10/11/2009)    His updated medication list for this problem includes:    Lisinopril 40 Mg Tabs (Lisinopril) .Marland Kitchen... Take 1 tablet by mouth once a day    Metformin Hcl 500 Mg Tabs (Metformin hcl)  .Marland Kitchen... Take 1 tablet by mouth two times a day for 2 weeks then two tablets twice daily    Lantus Solostar 100 Unit/ml Soln (Insulin glargine) ..... Inject 25 units subcutaneously each evening about the same time  Orders: Est. Patient Level IV (02725)  Medications Added to Medication List This Visit: 1)  Wrist Splint Misc (Elastic bandages & supports) .... Wear at night and with physical activity  Other Orders: T-GC Probe,  urine [phone]) T-Chlamydia  Probe, urine [phone]) T-RPR (Syphilis) [phone])       Medication Adherence: 04/12/2010   Adherence to medications reviewed with patient. Counseling to provide adequate adherence provided                                  Patient Instructions: 1)  make appt with your eye doctor 2)  rtc to see Dr. Daiva Eves in 4 months 3)  make appt with Jamison Neighbor in next few months Prescriptions: WRIST SPLINT  MISC (ELASTIC BANDAGES & SUPPORTS) wear at night and with physical activity  #1 x 3   Entered and Authorized by:   Acey Lav MD   Signed by:   Paulette Blanch Dam MD on 04/12/2010   Method used:   Print then Give to Patient   RxID:   [id number] OXYCODONE HCL 10 MG TABS (OXYCODONE HCL) take one to two tablets up to threee times a day as needed for pain  #90 x 0   Entered and Authorized by:   Acey Lav MD   Signed by:   Paulette Blanch Dam MD on 04/12/2010   Method used:   Print then Give to Patient   RxID:   [id number]

## 2010-06-09 NOTE — Miscellaneous (Signed)
Summary: clinical update/ryan white  Clinical Lists Changes  Observations: Added new observation of PCTFPL: 147.20  (10/11/2009 11:26) Added new observation of FINASSESSDT: 10/11/2009  (10/11/2009 11:26) Added new observation of YEARLYEXPEN: 1800  (10/11/2009 11:26)

## 2010-06-09 NOTE — Progress Notes (Signed)
Summary: pt switching to PPA  ---- Converted from flag ---- ---- 12/13/2009 2:45 PM, Lonell Face wrote: Sorry to be sending you so many flags.  I just looked through Shain's file and I thought he was currently being prescribed promethazine, but he's not.  Could you prescribe him the gabapentin?  So then his meds will be (excluding HIV) 1) Lipitor 2) Lisinopril 3) Omeprazole 4) Plavix 5) Oxycodone 6) Metformin 7) Lantus 8) Gabapentin Does that sound ok with you?  Thank you so much for working with [place] on this!  Both he and I appreciate it! ------------------------------  Phone Note Outgoing Call      New/Updated Medications: NEURONTIN 300 MG CAPS (GABAPENTIN) Take 1 tab by mouth at bedtime Prescriptions: NEURONTIN 300 MG CAPS (GABAPENTIN) Take 1 tab by mouth at bedtime  #30 x 11   Entered and Authorized by:   Acey Lav MD   Signed by:   Paulette Blanch Dam MD on 12/13/2009   Method used:   Print then Give to Patient   RxID:   [id number]

## 2010-06-09 NOTE — Medication Information (Signed)
Summary: [facility]   Imported By: Florinda Marker 12/13/2009 15:54:31  _____________________________________________________________________  External Attachment:    Type:   Image     Comment:   External Document

## 2010-06-09 NOTE — Miscellaneous (Signed)
Summary: [facility]: Testing Supplies  [facility]: Testing Supplies   Imported By: Florinda Marker 07/28/2009 15:27:01  _____________________________________________________________________  External Attachment:    Type:   Image     Comment:   External Document

## 2010-06-09 NOTE — Progress Notes (Signed)
Summary: correspondence from [employer]  Phone Note Outgoing Call   Call placed by: Jamison Neighbor RD,CDE,  June 23, 2009 5:00 PM Summary of Call: received paperwork for diabetic shoes and insert from [place], they need additonal prescription with [facility] codes on it. It was signed by nurse, sent back to [facility].

## 2010-06-09 NOTE — Assessment & Plan Note (Signed)
Summary: F/U/VS   Visit Type:  Follow-up Primary Provider:  Paulette Blanch Dam MD  CC:  left leg pain, Hypertension Management, and Lipid Management.  History of Present Illness: 60 year old with HIV (well controlled) DM, hyperlipidemia, HTN and PVD sp AKA on the right sp revision who returns to clinic for followup. He has complaint of bilateral elbow pain which he has had for months and had in the past and which he believes was attributed to a statinn which was then changed. He is alsos wondering if he can have a generic version of plavix or of lipitor (if he needs it). I reviewed his conditions, risk factors and lipds and concluded we were not going to control his lipids to goal with less potent statin. Furthermore I do not think that his elbow pain is due to his lipitor. He requested refill on his pain medication. pt complainign of tenderness in the elbows.  Hypertension History:      Positive major cardiovascular risk factors include male age 4 years old or older, diabetes, hyperlipidemia, and hypertension.  Negative major cardiovascular risk factors include non-tobacco-user status.        Positive history for target organ damage include peripheral vascular disease.  Further assessment for target organ damage reveals no history of ASHD, stroke/TIA, renal insufficiency, or hypertensive retinopathy.    Lipid Management History:      Positive NCEP/ATP III risk factors include male age 79 years old or older, diabetes, HDL cholesterol less than 40, hypertension, and peripheral vascular disease.  Negative NCEP/ATP III risk factors include non-tobacco-user status, no ASHD (atherosclerotic heart disease), no prior stroke/TIA, and no history of aortic aneurysm.    Problems Prior to Update: 1)  Leg Pain, Right  (ICD-729.5) 2)  Aka, Right, Hx of  (ICD-V49.76) 3)  Foot Pain, Left  (ICD-729.5) 4)  Diabetes Mellitus, Type II, Uncontrolled  (ICD-250.02) 5)  Hypoglycemia, Unspecified   (ICD-251.2) 6)  Dyspepsia  (ICD-536.8) 7)  Hypertension Nec  (ICD-997.91) 8)  Wrist Pain, Bilateral  (ICD-719.43) 9)  Preventive Health Care  (ICD-V70.0) 10)  Hyperlipidemia  (ICD-272.4) 11)  Folliculitis  (ICD-704.8) 12)  Foot Pain, Right  (ICD-729.5) 13)  Recurrent Staphylococcal Skin Abscesses  () 14)  Probable Perineal Cellulitis/perirectal Abscess  () 15)  Colonic Polyps, Adenomatous  (ICD-211.3) 16)  Multiple Sclerosis  (ICD-340) 17)  Penile Ulcerations  () 18)  Perineal Lesion  () 19)  Peripheral Vascular Disease  (ICD-443.9) 20)  HIV Disease  (ICD-042) 21)  Hepatitis B, Hx of  (ICD-V12.09) 22)  Gerd  (ICD-530.81)  Medications Prior to Update: 1)  Viread 300 Mg Tabs (Tenofovir Disoproxil Fumarate) .... Once A Day 2)  Promethazine Hcl 25 Mg  Tabs (Promethazine Hcl) .... Take One To Two Tablets Every 4 Hours As Needed For Nausea 3)  Lipitor 40 Mg  Tabs (Atorvastatin Calcium) .... Take 1 Tablet By Mouth Once A Day 4)  Isentress 400 Mg Tabs (Raltegravir Potassium) .... Take 1 Tablet By Mouth Two Times A Day 5)  Lisinopril 40 Mg Tabs (Lisinopril) .... Take 1 Tablet By Mouth Once A Day 6)  Omeprazole 40 Mg Cpdr (Omeprazole) .... Take 1 Tablet By Mouth Once A Day 7)  Plavix 75 Mg Tabs (Clopidogrel Bisulfate) .Marland Kitchen.. 1 By Mouth Daily 8)  Oxycodone Hcl 10 Mg Tabs (Oxycodone Hcl) .... Take One To Two Tablets Up To Threee Times A Day As Needed For Pain 9)  Combivir 150-300 Mg Tabs (Lamivudine-Zidovudine) .... Take 1 Tablet By  Mouth Two Times A Day 10)  Metformin Hcl 500 Mg Tabs (Metformin Hcl) .... Take 1 Tablet By Mouth Two Times A Day For 2 Weeks Then Two Tablets Twice Daily 11)  Lancets  Misc (Lancets) .... Use To Test Blood Sugar Three Times A Day (250.00) 12)  Freestyle Lite Test  Strp (Glucose Blood) .... Use To Test Blood Sugar Three Times A Day (250.00) 13)  Lantus Solostar 100 Unit/ml Soln (Insulin Glargine) .... Inject 25 Units Subcutaneously Each Evening About The Same Time 14)   Bd Pen Needle Nano U/f 32g X 4 Mm Misc (Insulin Pen Needle) .... Use To Inject Insulin One Time A Day (250.00)  Current Medications (verified): 1)  Viread 300 Mg Tabs (Tenofovir Disoproxil Fumarate) .... Once A Day 2)  Promethazine Hcl 25 Mg  Tabs (Promethazine Hcl) .... Take One To Two Tablets Every 4 Hours As Needed For Nausea 3)  Lipitor 40 Mg  Tabs (Atorvastatin Calcium) .... Take 1 Tablet By Mouth Once A Day 4)  Isentress 400 Mg Tabs (Raltegravir Potassium) .... Take 1 Tablet By Mouth Two Times A Day 5)  Lisinopril 40 Mg Tabs (Lisinopril) .... Take 1 Tablet By Mouth Once A Day 6)  Omeprazole 40 Mg Cpdr (Omeprazole) .... Take 1 Tablet By Mouth Once A Day 7)  Plavix 75 Mg Tabs (Clopidogrel Bisulfate) .Marland Kitchen.. 1 By Mouth Daily 8)  Oxycodone Hcl 10 Mg Tabs (Oxycodone Hcl) .... Take One To Two Tablets Up To Threee Times A Day As Needed For Pain 9)  Combivir 150-300 Mg Tabs (Lamivudine-Zidovudine) .... Take 1 Tablet By Mouth Two Times A Day 10)  Metformin Hcl 500 Mg Tabs (Metformin Hcl) .... Take 1 Tablet By Mouth Two Times A Day For 2 Weeks Then Two Tablets Twice Daily 11)  Lancets  Misc (Lancets) .... Use To Test Blood Sugar Three Times A Day (250.00) 12)  Freestyle Lite Test  Strp (Glucose Blood) .... Use To Test Blood Sugar Three Times A Day (250.00) 13)  Lantus Solostar 100 Unit/ml Soln (Insulin Glargine) .... Inject 25 Units Subcutaneously Each Evening About The Same Time 14)  Bd Pen Needle Nano U/f 32g X 4 Mm Misc (Insulin Pen Needle) .... Use To Inject Insulin One Time A Day (250.00)  Allergies: 1)  ! Sulfa   Preventive Screening-Counseling & Management  Alcohol-Tobacco     Alcohol drinks/day: 0     Smoking Status: never     Smoking Cessation Counseling: yes     Packs/Day: 25 cigs      Passive Smoke Exposure: no  Caffeine-Diet-Exercise     Caffeine use/day: 5+     Does Patient Exercise: no   Current Allergies: ! SULFA Past History:  Past Medical History: Last updated:  04/05/2009 GERD Hepatitis B, hx of HIV disease Peripheral vascular disease Dyspepsia Hyperlipidemia Diabetes diagnosed today 04/05/09  Past Surgical History: Last updated: 05/28/2008 Right BKA in July 2009  Family History: Last updated: 07/29/2007 no early cad  Social History: Last updated: 02/24/2008 stopped smoking Former Smoker  Risk Factors: Alcohol Use: 0 (10/11/2009) Caffeine Use: 5+ (10/11/2009) Exercise: no (10/11/2009)  Risk Factors: Smoking Status: never (10/11/2009) Packs/Day: 25 cigs  (10/11/2009) Passive Smoke Exposure: no (10/11/2009)  Family History: Reviewed history from 07/29/2007 and no changes required. no early cad  Social History: Reviewed history from 02/24/2008 and no changes required. stopped smoking Former Smoker  Review of Systems  The patient denies anorexia, fever, weight loss, weight gain, vision loss, decreased hearing, hoarseness, chest pain,  syncope, dyspnea on exertion, peripheral edema, prolonged cough, headaches, hemoptysis, abdominal pain, melena, hematochezia, severe indigestion/heartburn, hematuria, incontinence, genital sores, muscle weakness, suspicious skin lesions, transient blindness, difficulty walking, depression, unusual weight change, abnormal bleeding, and enlarged lymph nodes.    Vital Signs:  Patient profile:   60 year old male Height:      73 inches (185.42 cm) Weight:      169.75 pounds (77.16 kg) BMI:     22.48 Temp:     98.1 degrees F (36.72 degrees C) Pulse rate:   78 / minute BP sitting:   138 / 88  (left arm)  Vitals Entered By: Starleen Arms CMA (October 11, 2009 10:03 AM) CC: left leg pain, Hypertension Management, Lipid Management Is Patient Diabetic? Yes Did you bring your meter with you today? No Pain Assessment Patient in pain? yes     Location: left leg Intensity: 4 Type: aching Nutritional Status BMI of 19 -24 = normal Nutritional Status Detail nl  Does patient need  assistance? Functional Status Self care Ambulation Normal   Physical Exam  General:  alert.  well-nourished.   Head:  some facial muscle wasting, atraumatic and diffuse alopecia.   Eyes:  vision grossly intact, pupils equal, pupils round, and pupils reactive to light.   Ears:  no external deformities.   Nose:  no external deformity and no external erythema.   Mouth:  good dentition, no dental plaque, and pharynx pink and moist.   Neck:  supple and full ROM.   Lungs:  normal breath sounds, no crackles, and no wheezes.   Heart:  normal rate, regular rhythm, no murmur, and no gallop.   Abdomen:  soft, non-tender, normal bowel sounds, and no distention.   Msk:  no muscle tenderness in forearms or upper arms no joint deformities Neurologic:  alert & oriented X3strength normal in all extremities.   Skin:  turgor normal and color normal.   Psych:  Oriented X3, memory intact for recent and remote, normally interactive, and good eye contact.          Medication Adherence: 10/11/2009   Adherence to medications reviewed with patient. Counseling to provide adequate adherence provided   Prevention For Positives: 10/11/2009   Safe sex practices discussed with patient. Condoms offered.   Education Materials Provided: 10/11/2009 Safe sex practices discussed with patient. Condoms offered.                          Impression & Recommendations:  Problem # 1:  HIV DISEASE (ICD-042)  Excellent control. Needs std screening done though Diagnostics Reviewed:  HIV: HIV positive - not AIDS (02/13/2008)   CD4: 568 (07/05/2009)   WBC: 6.0 (02/25/2009)   Hgb: 13.5 (02/25/2009)   HCT: 40.5 (02/25/2009)   Platelets: 183 (02/25/2009) HIV genotype: TNP (02/04/2008)   HIV-1 RNA: 39 (07/05/2009)   HBSAg: NEG (05/28/2008)  Orders: Consultation Level V (54098)  Problem # 2:  AKA, RIGHT, HX OF (ICD-V49.76)  was refitted by vascular surgery for new orthotic  Orders: Consultation Level V  (11914)  Problem # 3:  DIABETES MELLITUS, TYPE II, UNCONTROLLED (ICD-250.02)  has seen Dr .Dione Booze and willl continue to follow wit him.also following with Jamison Neighbor His updated medication list for this problem includes:    Lisinopril 40 Mg Tabs (Lisinopril) .Marland Kitchen... Take 1 tablet by mouth once a day    Metformin Hcl 500 Mg Tabs (Metformin hcl) .Marland Kitchen... Take 1 tablet by mouth two  times a day for 2 weeks then two tablets twice daily    Lantus Solostar 100 Unit/ml Soln (Insulin glargine) ..... Inject 25 units subcutaneously each evening about the same time  Orders: Consultation Level V (60454)  Problem # 4:  HYPERTENSION NEC (ICD-997.91)  well controlled  Orders: Consultation Level V (09811)  Problem # 5:  HYPERLIPIDEMIA (ICD-272.4) Needs to stay on liptor to stay under 70 on ldl His updated medication list for this problem includes:    Lipitor 40 Mg Tabs (Atorvastatin calcium) .Marland Kitchen... Take 1 tablet by mouth once a day  Labs Reviewed: SGOT: 24 (07/05/2009)   SGPT: 37 (07/05/2009)  Lipid Goals: Chol Goal: 200 (10/11/2009)   HDL Goal: 40 (10/11/2009)   LDL Goal: 70 (10/11/2009)   TG Goal: 150 (10/11/2009)  Prior 10 Yr Risk Heart Disease: 18 % (04/05/2009)   HDL:33 (07/05/2009), 32 (02/25/2009)  LDL:65 (07/05/2009), 82 (02/25/2009)  Chol:122 (07/05/2009), 142 (02/25/2009)  Trig:121 (07/05/2009), 140 (02/25/2009)  Problem # 6:  ELBOW PAIN, BILATERAL (ICD-719.42)  likely due to osteoarthritis , possibly neuropathy not related to statin conservative management  Orders: Consultation Level V (91478)  Other Orders: T-GC Probe, urine ([id number]) T-Chlamydia  Probe, urine ([id number])  Hypertension Assessment/Plan:      The patient's hypertensive risk group is category C: Target organ damage and/or diabetes.  His calculated 10 year risk of coronary heart disease is 18 %.  Today's blood pressure is 138/88.  His blood pressure goal is < 140/90.  Lipid Assessment/Plan:      Based on  NCEP/ATP III, the patient's risk factor category is "history of coronary disease, peripheral vascular disease, cerebrovascular disease, or aortic aneurysm along with either diabetes, current smoker, or LDL > 130 plus HDL < 40 plus triglycerides > 200".  The patient's lipid goals are as follows: Total cholesterol goal is 200; LDL cholesterol goal is 70; HDL cholesterol goal is 40; Triglyceride goal is 150.  His LDL cholesterol goal has been met.        Patient Instructions: 1)  rtc to see Dr. Daiva Eves in 6 months Prescriptions: OXYCODONE HCL 10 MG TABS (OXYCODONE HCL) take one to two tablets up to threee times a day as needed for pain  #90 x 0   Entered and Authorized by:   Acey Lav MD   Signed by:   Paulette Blanch Dam MD on 10/11/2009   Method used:   Print then Give to Patient   RxID:   [id number] OXYCODONE HCL 10 MG TABS (OXYCODONE HCL) take one to two tablets up to threee times a day as needed for pain  #90 x 0   Entered and Authorized by:   Acey Lav MD   Signed by:   Paulette Blanch Dam MD on 10/11/2009   Method used:   Print then Give to Patient   RxID:   [id number]   Appended Document: Office Visit - Infectious Disease      Current Allergies: ! SULFA   Laboratory Results   Blood Tests   Date/Time Received: Mariea Clonts  October 11, 2009 3:36 PM ' Date/Time Reported: Mariea Clonts  October 11, 2009 3:36 PM   HGBA1C: 6.0%   (Normal Range: Non-Diabetic - 3-6%   Control Diabetic - 6-8%)

## 2010-06-09 NOTE — Progress Notes (Signed)
Summary: drug clarification  Phone Note Call from Patient   Caller: Patient Summary of Call: patient is callimg about medication to send to [facility] in [place].  What we have in chart does not match what patient is taking. Patient said he is taking Epivir 150mg  twice daily, Isentress 400mg  twice daily, Viread 300mg  daily, Zidovudine (supposed to be Combivir) twice daily.  Please advise what the correct regimen is so Pharmacy can be notified and patient can get his medication.  Initial call taken by: Paulo Fruit  BS,CPht II,MPH,  August 09, 2009 10:42 AM  Follow-up for Phone Call        Sandria Senter and zidovudine together are equal to combivir. ADAP and other plans at one point in time forced everyone to go to the drugs split into their component parts. Is that what has happened? I would like him to be on what we have rx in the chart but if there is a good reason for these meds to be rx separately I can do so but I see no clear reason for this at this pt in time Follow-up by: Acey Lav MD,  August 09, 2009 12:01 PM     Appended Document: drug clarification Yes, that is what happened.  I can fix to what he said he is on in the EMR if you would like for me too.  I will go ahead and call in the correct medication so he can get his medicine because he is out.  [facility] transferred all of the prescriptions they had on file to the new contracted AD[facility] pharmacy [facility]) so they just wanted clarification  Appended Document: Orders Update Byrd Hesselbach is it not OK now per adap and medicare for pts to get combivir one tablet twice dailiy instead of split intot two pills? If so I would like him on combivir one tab two times a day, isentress one tab two times a day and viread one tab daily. Thanks!   Clinical Lists Changes

## 2010-06-09 NOTE — Progress Notes (Signed)
Summary: diabetes testing supplies/dmr  Phone Note Outgoing Call   Call placed by: Jamison Neighbor RD,CDE,  August 11, 2009 11:29 AM Summary of Call: up to 27 units a day. still hasn't come down, this morning 144, 130 range- only testing in ams because he never got increased testing supplies. No signs or symptoms fo low blood sugar. Instructed him not to increase his insulin any further until A1C done in  May. Follow up suggested with CDE in June or earlier as needed.    Follow-up for Phone Call        called Mail order company- they had mistakenly not processed that he  is now on insulin - so do not need CBG log before they can send him supplies for 3x/daytesting. requested patient continue to test before dinner nad bed and anytime he feels funny or symptoms of hypoglyemia.  they will call patient and send him additional supplies. Follow-up by: Jamison Neighbor RD,CDE,  August 13, 2009 5:10 PM    New/Updated Medications: LANTUS SOLOSTAR 100 UNIT/ML SOLN (INSULIN GLARGINE) inject 25 units subcutaneously each evening about the same time

## 2010-06-09 NOTE — Progress Notes (Signed)
Summary: needs pen needles and strips/dmr  Phone Note Outgoing Call   Call placed by: Jamison Neighbor RD,CDE,  July 19, 2009 3:39 PM Summary of Call: morning blood sugars : sugar today was 164, 156, 167 "insulin didn't bring it down too much"-suggested self titration 1 unit each day until goal fasting CBg reached x 2 days patient agreed and repeated bakc instructions and understanding that he is to call us with new dose once he has settled on one. Has received lantus pens from [facility]- Needs pen needles, lancets and strips prescription sent to [facility] - ID nurse assisted with this request.   he also needs a follow-up scheduled and an A1C.   Follow-up for Phone Call        THanks Lupita Leash. It looks like you ordered all of these suppplies already. I put in for a1c. I think he should be plugged in to IM side as well to facilitate greater access to acute care for his diabetes and other non HIV issues Follow-up by: Acey Lav MD,  July 19, 2009 5:33 PM    New/Updated Medications: LANTUS SOLOSTAR 100 UNIT/ML SOLN (INSULIN GLARGINE) inject 10 units subcutaneously each evening about the same time, increase 1 unit each day  until goal fasting blood sugar reached x 2 days, then hold at that dose  Appended Document: needs pen needles and strips/dmr called [facility]- they do not handle testing supplies- only medications- called [facility] and requested increased self monitoring they are awaiting an updated order form to approve this. order being faxed for signature

## 2010-06-09 NOTE — Assessment & Plan Note (Signed)
Summary: elbow pain, flu shot   Vital Signs:  Patient profile:   60 year old male Height:      73 inches (185.42 cm) Weight:      171.1 pounds (77.77 kg) BMI:     22.66 Temp:     98.1 degrees F (36.72 degrees C) oral Pulse rate:   71 / minute BP sitting:   148 / 84  (right arm)  Vitals Entered By: Wendall Mola CMA Duncan Dull) (March 15, 2010 10:53 AM) CC: pt. c/o left elbow pain x several months, has gotten worse Is Patient Diabetic? Yes Did you bring your meter with you today? No Pain Assessment Patient in pain? yes     Location: left elbow Intensity: 5 Type: sharp Onset of pain  Constant Nutritional Status BMI of 19 -24 = normal Nutritional Status Detail appetite "good"  Have you ever been in a relationship where you felt threatened, hurt or afraid?No   Does patient need assistance? Functional Status Self care Ambulation Impaired:Risk for fall Comments pt. walks with cane no missed doses of meds per pt.   Primary Provider:  Paulette Blanch Dam MD  CC:  pt. c/o left elbow pain x several months and has gotten worse.  History of Present Illness: Presents with 100-month h/o progressively worsening pain in (L) elbow that has now radiated down (L) forearm and wrist in a "shooting" pain fashion.   Denies trauma, no precipitating or alleviating factors.  Pain in elbow described as chronic dull ache, and shooting pain occurs when elbow is flexed.  Has been taking oxycodone for the pain with relief, but has since run out of med and pain has become persistent.  Preventive Screening-Counseling & Management  Alcohol-Tobacco     Alcohol drinks/day: 0     Smoking Status: quit > 6 months     Smoking Cessation Counseling: yes     Packs/Day: 25 cigs      Passive Smoke Exposure: no  Caffeine-Diet-Exercise     Caffeine use/day: coffee, soda 3 per day     Does Patient Exercise: no  Hep-HIV-STD-Contraception     HIV Risk: risk noted     HIV Risk Counseling:  04/10/2005  Safety-Violence-Falls     Seat Belt Use: 100      Drug Use:  no.    Allergies: 1)  ! Sulfa  Review of Systems General:  Denies chills, fatigue, fever, loss of appetite, malaise, sleep disorder, sweats, weakness, and weight loss. CV:  Denies bluish discoloration of lips or nails, chest pain or discomfort, difficulty breathing at night, difficulty breathing while lying down, fainting, fatigue, leg cramps with exertion, lightheadness, near fainting, palpitations, shortness of breath with exertion, swelling of feet, swelling of hands, and weight gain. MS:  Complains of joint pain, loss of strength, and stiffness; denies joint redness and joint swelling; all related to (L) elbow pain. Neuro:  Complains of tingling; denies brief paralysis, numbness, tremors, and weakness.  Physical Exam  General:  Well-developed,well-nourished,in no acute distress; alert,appropriate and cooperative throughout examination Head:  Normocephalic and atraumatic without obvious abnormalities. No apparent alopecia or balding. Neck:  No deformities, masses, or tenderness noted. Lungs:  Normal respiratory effort, chest expands symmetrically. Lungs are clear to auscultation, no crackles or wheezes. Heart:  Normal rate and regular rhythm. S1 and S2 normal without gallop, murmur, click, rub or other extra sounds. Msk:  no joint swelling, no joint warmth, no redness over joints, no joint deformities, no joint instability, no muscle  atrophy, decreased ROM, and joint tenderness.   Pulses:  R and L carotid,radial,femoral,dorsalis pedis and posterior tibial pulses are full and equal bilaterally Extremities:  No clubbing, cyanosis, edema, or deformity noted with normal full range of motion of all joints.   Neurologic:  strength normal in all extremities (except RLE secondary to amputation).  strength normal in all extremities and sensation intact to pinprick.     Impression & Recommendations:  Problem # 1:  ELBOW  PAIN, LEFT (ICD-719.42) Clinical findings c/w nerve impingement most likely descending down radial & ulnar nerves.  Since this is a progressively worsening problem, we should first X-ray the left elbow and forearm to check for deformity and then progress with nerve conductioin studies, if warranted.  We will provide 30 tablets of his previously prescribed oxycodone and he can address address any further refills with Dr. Daiva Eves on his follow-up.: Radiology other (Radiology Other)  Problem # 2:  HIV DISEASE (ICD-042) Labs per research to be drawn today.  I added Hb A1C to lab draw as well.  Schedule follow-up with Dr. Daiva Eves for 3 weeks.  Other Orders: Est. Patient Level III (99213) T- Hemoglobin A1C (40347-42595) Influenza Vaccine NON MCR (63875)  Prescriptions: OXYCODONE HCL 10 MG TABS (OXYCODONE HCL) take one to two tablets up to threee times a day as needed for pain  #30 x 0   Entered and Authorized by:   Talmadge Chad NP   Signed by:   Talmadge Chad NP on 03/15/2010   Method used:   Print then Give to Patient   RxID:   [id number]    Orders Added: 1)  Radiology other [Radiology Other] 2)  Est. Patient Level III [30160] 3)  T- Hemoglobin A1C [83036-23375] 4)  Influenza Vaccine NON MCR [00028]   Immunizations Administered:  Influenza Vaccine # 1:    Vaccine Type: Fluvax Non-MCR    Site: right deltoid    Mfr: Novartis    Dose: 0.5 ml    Route: IM    Given by: Wendall Mola CMA ( AAMA)    Exp. Date: 08/07/2010    Lot #: [id number]    VIS given: 11/30/09 version given March 15, 2010.  Flu Vaccine Consent Questions:    Do you have a history of severe allergic reactions to this vaccine? no    Any prior history of allergic reactions to egg and/or gelatin? no    Do you have a sensitivity to the preservative Thimersol? no    Do you have a past history of Guillan-Barre Syndrome? no    Do you currently have an acute febrile illness? no    Have you ever  had a severe reaction to latex? no    Vaccine information given and explained to patient? yes   Immunizations Administered:  Influenza Vaccine # 1:    Vaccine Type: Fluvax Non-MCR    Site: right deltoid    Mfr: Novartis    Dose: 0.5 ml    Route: IM    Given by: Wendall Mola CMA ( AAMA)    Exp. Date: 08/07/2010    Lot #: [id number]    VIS given: 11/30/09 version given March 15, 2010.

## 2010-06-23 ENCOUNTER — Telehealth (INDEPENDENT_AMBULATORY_CARE_PROVIDER_SITE_OTHER): Payer: Self-pay | Admitting: *Deleted

## 2010-06-29 NOTE — Progress Notes (Signed)
Summary: ADAP/SPAP re-enrollment completed  Phone Note Call from Patient   Summary of Call: ADAP/SPAP application completed by Lonell Face on 06/22/2010

## 2010-06-30 ENCOUNTER — Telehealth: Payer: Self-pay | Admitting: Dietician

## 2010-06-30 NOTE — Telephone Encounter (Signed)
Scheduled with CDE for 07/05/10 at 10 AM

## 2010-07-05 ENCOUNTER — Ambulatory Visit (INDEPENDENT_AMBULATORY_CARE_PROVIDER_SITE_OTHER): Payer: Medicare Other | Admitting: Dietician

## 2010-07-05 ENCOUNTER — Telehealth: Payer: Self-pay

## 2010-07-05 DIAGNOSIS — E119 Type 2 diabetes mellitus without complications: Secondary | ICD-10-CM

## 2010-07-05 LAB — POCT GLYCOSYLATED HEMOGLOBIN (HGB A1C): Hemoglobin A1C: 8

## 2010-07-05 NOTE — Patient Instructions (Signed)
Your A1C was 8.0% today. This means your blood sugars have been averaging 207 mg/dl  for the past 3 months. This is an increase from 6.6% ( which is an average of 158)  in November 2011.  The plan we made today to address this increase is for you to:  1- check blood sugar 7 times a day for 3 days as we discussed, record on sheet provided with other information and call me with results.  2- Remember carbs (starch, milk, starchy vegetables, fruit and sweets) ALL affect your blood sugar more than protein, fats or non-starchy veggies. Therefore, to have the least impact on your blood sugar, it is recommended to spread your intake of  carbs out evenly over the day.   3- We did not discuss today, but should if interested:  replacing the type of fats you eat with fats that decrease your risk of heart disease, stroke or vascular disease and how to do this.   Look forward to talking to you soon!  Lupita Leash [phone] .

## 2010-07-05 NOTE — Progress Notes (Signed)
Blood sugar today after coffee with creamer was 143.   Medical Nutrition Therapy: Inital:  Appt start time: 1005 end time:  1120.  Assessment:  Primary concerns today:Needs foot exam, eye exam, A1C checked and increase addressed at today's visit. Patient concerns include Blood sugar control and Annual Review Usual eating pattern includes Meal 2 and 1+ snacks per day.    Avoided foods include: cookies and candy, sweetened cereal and ice cream and desserts Usual physical activity includes ADLs, gardening  Progress Towards Goal(s):  In progress   Nutritional Diagnosis:  NB-1.4 Self-monitoring deficit As related to not self monitoring his blood sugars.  As evidenced by his report and his disbelief inthe accuracy of different meters. [place]-2.2 Altered nutrition-related laboratory As related to his diabetes.  As evidenced by his higher A1C. [place]-5.6.3 Inappropriate intake of fats (specify): saturated fats As related to history of vascular disease and metabolic syndrome.  As evidenced by patient report of using whole milk, fatback in foods..  Interventions: 1- check blood sugar 7 times a day for 3 days as we discussed, record on sheet provided with other information and call me with results.  2- Remember carbs (starch, milk, starchy vegetables, fruit and sweets) ALL affect your blood sugar more than protein, fats or non-starchy veggies. Therefore, to have the least impact on your blood sugar, it is recommended to spread your intake of  carbs out evenly over the day.   3- We did not discuss today, but should if interested:  replacing the type of fats you eat with fats that decrease your risk of heart disease, stroke or vascular disease and how to do this.   Monitoring/Evaluation:  Blood sugars reported back by patient, interest in changing fat types, Dietary intake as appropriate, next A1C

## 2010-07-15 ENCOUNTER — Telehealth: Payer: Self-pay | Admitting: Dietician

## 2010-07-15 NOTE — Telephone Encounter (Signed)
Returned call and Left message. [name],Nathan 10:17 AM 07/15/2010

## 2010-07-18 NOTE — Telephone Encounter (Signed)
Charted blood sugars in centricity and routed to Dr. Daiva Eves: all > 184 on 20 blood sugars in 3 days with patient testing fasting, and before and after meals and bedtime. No pattern noted. Patient also working on carb consistency. Message left with DR. [facility] about patient self-titrating his Lantus to fasting blood sugars 90-130 on Friday.  Spoke to Dr. Daiva Eves today who okayed patient self increasing Lantus until fasting blood sugars are 90-130.   Called patient with order to increase Lantus 1 unit each day until fasting blood sugars 90-130, he is to call here or [facility] with updated amount of daily lantus. Follow up appointment made for 3/19 @ 2PM with CDE

## 2010-07-25 ENCOUNTER — Ambulatory Visit (INDEPENDENT_AMBULATORY_CARE_PROVIDER_SITE_OTHER): Payer: Medicare Other | Admitting: Dietician

## 2010-07-25 DIAGNOSIS — E119 Type 2 diabetes mellitus without complications: Secondary | ICD-10-CM

## 2010-07-25 NOTE — Patient Instructions (Signed)
1- Please change to a different box of Lantus and begin using a new pen from it and do not increase your Lantus for 5 days.  2- I will investigate Combivir and Lipitor interactions.   3- Call me if you stop increasing your lantus after the 5th day because your blood sugars are less than 130. Otherwise, let's f/u in 3 weeks

## 2010-07-25 NOTE — Progress Notes (Signed)
Blood sugar today after lunch was 271.   Medical Nutrition Therapy: Inital:  Appt start time: 1355 end time: 1435  Assessment:  Primary concerns today:are increase in blood sugars and a1C.Meter downloaded and results show very little improvement despite patient increasing lantus to 36 units a day. He is verbalizes correct injection technique and storage and rotation of insulin sites and expiration dates. Usual eating pattern includes Verbalizes appropriate carb intake and weight is stable.  Usual physical activity includes ADLs, gardening  Progress Towards Goal(s):  In progress   Nutritional Diagnosis:   NB 1.4 self monitoring deficit resolved.  [place] 2.2 Altered nutrition related laboratory value continues to be a problem with blood sugars this visit about the same or higher than they were at last visit.  [place] 5.6.3 Inappropriate intake of fats- still consuming foods high in saturated fats, making some progress towards goal of 15 grams max of sat fat per day  Interventions: 1- Try to eat mostly healthy fats and healthy carbs,spreading them out over the day  2- Discussed/helped troubleshoot insulin storage/possiblity of bad insulin with patient: he will change to new pen from new box at same dose x5 days before increasing lantus insulin further.  3-Continue to eat fruits - 3 5 servings a day at most.   Monitoring/Evaluation:  Blood sugars reported back by patient.   Follow up: see patient instructions

## 2010-07-26 NOTE — Progress Notes (Signed)
Summary: Referral and Rx for Diabetic shoes/dmr  Phone Note From Other Clinic   Summary of Call: need referral signed for dsmt and MNT that is in this note please.   Patient also requests prescription for diabetic shoes and inserts and also will need certification which I will get to your office for you to sign.   Initial call taken by: Jamison Neighbor RD,CDE,  July 05, 2010 11:53 AM  Follow-up for Phone Call        [facility] thanks Lupita Leash Follow-up by: Acey Lav MD,  July 05, 2010 12:43 PM  Additional Follow-up for Phone Call Additional follow up Details #1::        spoke with patient today. he asked about prescription for diabetic shoes and certificate of medical necessity. let me know if I can help with this.  Patient also reported 3 days of intensive blood sugar self monitoring as planned to address recent increase in a1C:  3/5   237/271      293/225     184/194     193 3/6   208/270      242/236     250/           248 3/7   185/243      234/195     239/304     257  His blood sugars do not show pattern of increasing during day nor consistent > 50 mg/dl rise after meals indicating need for prandial insulin coverage, therfore think he would benefit from increas ein lantus.  Suggest patient begin self titration of lantus to bring fasting and before meal blood sugars in control and he also agreed to begin looking a bit closer at carb intake and follow up with CDE in 2 weeks to discuss progress.    is increasing lantus okay? Additional Follow-up by: Jamison Neighbor RD,CDE,  July 15, 2010 2:40 PM    Additional Follow-up for Phone Call Additional follow up Details #2::    That is fine with me Lupita Leash. I have hand written rx for diabetic shoes and inserts for him (he has ampuation on one side so only should need one shoe and inserts with it). Thanks! Follow-up by: Acey Lav MD,  July 18, 2010 9:37 AM  New/Updated Medications: LANTUS SOLOSTAR 100 UNIT/ML SOLN (INSULIN GLARGINE)  inject 26 units subcutaneously each evening about the same time and increase by 1 unit each day until fasting blood sugars are mostly less than 130mg /dl   Diabetes Self Management Training Referral Patient Name: Nathan [name] Date Of Birth: 1950-08-10 MRN: [id number] Current Diagnosis:  DIABETES MELLITUS, TYPE II, CONTROLLED (ICD-250.00) CARPAL TUNNEL SYNDROME (ICD-354.0) ELBOW PAIN, LEFT (ICD-719.42) ELBOW PAIN, BILATERAL (ICD-719.42) LEG PAIN, RIGHT (ICD-729.5) AKA, RIGHT, HX OF (ICD-V49.76) FOOT PAIN, LEFT (ICD-729.5) DIABETES MELLITUS, TYPE II, UNCONTROLLED (ICD-250.02) HYPOGLYCEMIA, UNSPECIFIED (ICD-251.2) DYSPEPSIA (ICD-536.8) HYPERTENSION NEC (ICD-997.91) WRIST PAIN, BILATERAL (ICD-719.43) PREVENTIVE HEALTH CARE (ICD-V70.0) HYPERLIPIDEMIA (ICD-272.4) FOLLICULITIS (ICD-704.8) FOOT PAIN, RIGHT (ICD-729.5) * RECURRENT STAPHYLOCOCCAL SKIN ABSCESSES * PROBABLE PERINEAL CELLULITIS/PERIRECTAL ABSCESS COLONIC POLYPS, ADENOMATOUS (ICD-211.3) MULTIPLE SCLEROSIS (ICD-340) * PENILE ULCERATIONS * PERINEAL LESION PERIPHERAL VASCULAR DISEASE (ICD-443.9) HIV DISEASE (ICD-042) HEPATITIS B, HX OF (ICD-V12.09) GERD (ICD-530.81)     Management Training Needs:   Follow-up DSMT(2 hours/year)  Initial Medical Nutrition Therapy(3 hours/1st year)  Complicating Conditions:

## 2010-08-15 ENCOUNTER — Ambulatory Visit: Payer: Medicare Other | Admitting: Dietician

## 2010-08-23 LAB — CBC
HCT: 36.3 % — ABNORMAL LOW (ref 39.0–52.0)
Hemoglobin: 12.4 g/dL — ABNORMAL LOW (ref 13.0–17.0)
MCHC: 34.2 g/dL (ref 30.0–36.0)
MCHC: 35.3 g/dL (ref 30.0–36.0)
MCV: 114.5 fL — ABNORMAL HIGH (ref 78.0–100.0)
MCV: 115.9 fL — ABNORMAL HIGH (ref 78.0–100.0)
Platelets: 176 10*3/uL (ref 150–400)
RBC: 2.86 MIL/uL — ABNORMAL LOW (ref 4.22–5.81)
RBC: 3.14 MIL/uL — ABNORMAL LOW (ref 4.22–5.81)
RDW: 13.4 % (ref 11.5–15.5)
RDW: 13.9 % (ref 11.5–15.5)

## 2010-08-23 LAB — POCT I-STAT, CHEM 8
BUN: 9 mg/dL (ref 6–23)
Creatinine, Ser: 0.8 mg/dL (ref 0.4–1.5)
Glucose, Bld: 141 mg/dL — ABNORMAL HIGH (ref 70–99)
Hemoglobin: 13.9 g/dL (ref 13.0–17.0)
Potassium: 4 mEq/L (ref 3.5–5.1)

## 2010-09-02 ENCOUNTER — Ambulatory Visit (INDEPENDENT_AMBULATORY_CARE_PROVIDER_SITE_OTHER): Payer: Medicare Other | Admitting: *Deleted

## 2010-09-02 ENCOUNTER — Encounter: Payer: Self-pay | Admitting: *Deleted

## 2010-09-02 VITALS — BP 129/75 | HR 75 | Temp 97.5°F | Wt 170.2 lb

## 2010-09-02 DIAGNOSIS — E785 Hyperlipidemia, unspecified: Secondary | ICD-10-CM

## 2010-09-02 DIAGNOSIS — B20 Human immunodeficiency virus [HIV] disease: Secondary | ICD-10-CM

## 2010-09-02 LAB — COMPREHENSIVE METABOLIC PANEL
AST: 25 U/L (ref 0–37)
Albumin: 4.4 g/dL (ref 3.5–5.2)
Alkaline Phosphatase: 68 U/L (ref 39–117)
Potassium: 4.2 mEq/L (ref 3.5–5.3)
Sodium: 137 mEq/L (ref 135–145)
Total Bilirubin: 0.7 mg/dL (ref 0.3–1.2)
Total Protein: 6.8 g/dL (ref 6.0–8.3)

## 2010-09-02 LAB — LIPID PANEL
Cholesterol: 114 mg/dL (ref 0–200)
HDL: 26 mg/dL — ABNORMAL LOW (ref >39)
Total CHOL/HDL Ratio: 4.4 Ratio
VLDL: 29 mg/dL (ref 0–40)

## 2010-09-02 NOTE — Progress Notes (Signed)
Patient here for week 688 ALLRT study visit. He denies any new problems or concerns. Pain in his left leg and arm are decreased. He is planning to see Dr. Daiva Eves soon.

## 2010-09-13 ENCOUNTER — Other Ambulatory Visit (INDEPENDENT_AMBULATORY_CARE_PROVIDER_SITE_OTHER): Payer: Medicare Other | Admitting: Licensed Clinical Social Worker

## 2010-09-13 ENCOUNTER — Other Ambulatory Visit: Payer: Self-pay | Admitting: Licensed Clinical Social Worker

## 2010-09-13 DIAGNOSIS — L0292 Furuncle, unspecified: Secondary | ICD-10-CM

## 2010-09-13 DIAGNOSIS — L0293 Carbuncle, unspecified: Secondary | ICD-10-CM

## 2010-09-13 MED ORDER — DOXYCYCLINE HYCLATE 100 MG PO TABS: 100.0000 mg | ORAL_TABLET | Freq: Two times a day (BID) | ORAL | Status: AC

## 2010-09-20 NOTE — Procedures (Signed)
BYPASS GRAFT EVALUATION   INDICATION:  Left popliteal-posterior tibial artery stent.   HISTORY:  Diabetes:  No  Cardiac:  No  Hypertension:  Yes  Smoking:  Previous  Previous Surgery:  Left popliteal-PTA stent, July 02, 2008.   SINGLE LEVEL ARTERIAL EXAM                               RIGHT              LEFT  Brachial:                    133                138  Anterior tibial:                                98  Posterior tibial:                               75  Peroneal:  Ankle/brachial index:        Below-knee amputation                  0.71   PREVIOUS ABI:  Date: February 01, 2009  RIGHT:  BKA  LEFT:  0.78   LOWER EXTREMITY BYPASS GRAFT DUPLEX EXAM:   DUPLEX:  Triphasic to biphasic Doppler waveforms throughout the lower  extremity arteries and stent.   IMPRESSION:  1. Patent stent and arteries with no evidence of stenosis.  2. Stable left ABI.   ___________________________________________  V. Charlena Cross, MD   CJ/MEDQ  D:  05/24/2009  T:  05/24/2009  Job:  [id number]

## 2010-09-20 NOTE — Assessment & Plan Note (Signed)
OFFICE VISIT   Nathan [name], Nathan [name]  DOB:  [birth year]                                       08/03/2008  EAVWU#:[id number]   REASON FOR VISIT:  Follow-up.   HISTORY:  This is a 57-year gentleman well known to me.  He is status  post failed stent and bypass in the right leg and ultimately requiring  above-knee amputation.  He recently began having problems in his left  leg with a significant decline in his ankle brachial indices.  On  July 02, 2008, he underwent stenting of his left popliteal artery  for a 95% stenosis.  He has single vessel runoff via his anterior tibial  artery.  He comes back in today for follow-up.  He did have an episode  of severe swelling.  He saw Dr. Darrick Penna for this.  A DVT ultrasound was  performed which was negative.  His swelling resolved in 3 days.  He is  not having any other problems.   PHYSICAL EXAMINATION:  Vital Signs:  Blood pressure 162/86, pulse 76.  General:  He is well-appearing, in no distress.  His left leg is warm  and well-perfused, I do not palpate a pedal pulse, there is no  ulceration.   Diagnostic studies were performed today.  His ankle brachial on the left  is 0.8, this is increased from his preprocedures of 0.65.   ASSESSMENT/PLAN:  Status post stenting left leg.   Plan:  The patient is placed on our stent protocol.  He will follow up  every 3 months with ultrasound.  I am going to see him back in 6 months.  If he has any significant change in his complaints, he will contact me.  I plan on keeping him on his Plavix indefinitely.   Jorge Ny, MD  Electronically Signed   VWB/MEDQ  [name]:  08/03/2008  T:  08/04/2008  Job:  [id number]

## 2010-09-20 NOTE — Consult Note (Signed)
NEW PATIENT CONSULTATION   ISIDORO, SANTILLANA D  DOB:  Jan 25, 1951                                       02/08/2007  ZOXWR#:[id number]   HISTORY:  Boysie Bonebrake presents today for evaluation of right leg pain.  He is a 60 year old gentleman with increasingly severe pain in his right  foot.  He reports that this is constant and is worse with walking.  He  reports this keeps him awake at night as well.  He does have multiple  other issues including bilateral peripheral neuropathy, multiple  sclerosis.  He is HIV positive.  He does have a prior stenting of what  sounds like his right superficial femoral artery four years ago at [facility].  He has not had any tissue loss.   PAST MEDICAL HISTORY:  Positive for elevated cholesterol.  He does not  have any hypertension or diabetes.   FAMILY HISTORY:  Significant for premature atherosclerotic disease in  his mother and his father.   SOCIAL HISTORY:  He is single.  He does smoke 1-1/2 packs of cigarettes  per day.  He does not drink alcohol on a regular basis.   REVIEW OF SYSTEMS:  Negative aside from leg pain.   He is allergic to sulfa drugs.   PHYSICAL EXAMINATION:  GENERAL:  Well-developed, well-nourished white  male appearing stated age of 72.  VITAL SIGNS:  Blood pressure 155/84, pulse 71, respirations 20.  VASCULAR:  His radial pulses are 2+ bilaterally.  He has 2+ femoral  pulses bilaterally.  He has 2+ left popliteal pulse and absent pedal  pulses.  He does have cyanotic changes in his right foot with dependency  and does have specific cyanosis of his right fifth toe.   He had undergone noninvasive vascular laboratory studies at [facility] on September 24.  His noninvasive study showed dampened  monophasic flow in his right foot with an ABI of 0.56, on the left was  0.74.   I had a long discussion with Mr. Jandreau.  It appears that he has  occluded his superficial femoral artery which had  been stented in the  past.  He is not able to tolerate this level of ischemia.  I have  recommended that he undergo repeat arteriography and possible  angioplasty.  We have scheduled this at [facility] on October 7  with Dr. Durene Cal.  He understands that if intervention is possible  we will proceed at that time; if not, we will discuss surgical options  for correction of his right foot ischemia.   Larina Earthly, M.D.  Electronically Signed   TFE/MEDQ  D:  02/08/2007  T:  02/11/2007  Job:  [id number]   cc:   Cliffton Asters, M.D.

## 2010-09-20 NOTE — Op Note (Signed)
NAME:  Nathan [name], Nathan [name]               ACCOUNT NO.:  [id number]   MEDICAL RECORD NO.:  [id number]          PATIENT TYPE:  INP   LOCATION:  2010                         FACILITY:  [facility]   PHYSICIAN:  Juleen China IV, MDDATE OF BIRTH:  [birth year]   DATE OF PROCEDURE:  11/22/2007  DATE OF DISCHARGE:                               OPERATIVE REPORT   PREOPERATIVE DIAGNOSES:  1. Right leg ischemia.  2. Right groin mass.   POSTOPERATIVE DIAGNOSES:  1. Right leg ischemia.  2. Right groin seroma.   ANESTHESIA:  General.   SURGEON:  1. Wells Brabham IV, MD.   BLOOD LOSS:  250 mL.   COMPLICATIONS:  None.   SPECIMENS:  Right leg.   INDICATIONS:  This is a 60 year old gentleman with HIV who underwent  right femoral to below-knee popliteal artery bypass with vein.  He was  found to have a 99% stenosis on duplex, and was taken for percutaneous  attempted revascularization.  This was complicated by thrombosis of his  vein, which was a small conduit.  He then underwent redo femoral to  below-knee popliteal artery bypass graft with Gore-Tex, which occluded  following his bypass.  The patient was known to have single-vessel  runoff via his peroneal artery.  When his below-knee popliteal bypass  occluded, he was converted to a femoral-to-peroneal bypass with [employer].  This also occluded shortly after it had been completed.  The  patient elected for a thrombectomy of his peroneal bypass with his last  attempted limb salvage, which was unsuccessful.  The patient's right  foot is now cold and not painful, and he is scheduled for a below-knee,  possible above-knee amputation.  Informed consent was signed.   PROCEDURE:  The patient identified in the holding area and taken to room  9.  He was placed supine on the table.  The right leg was prepped and  draped in a standard sterile fashion.  Time-out was called, antibiotics  were given.  A site was selected 12 cm below the tibial tuberosity.  Circumferential measurement was obtained and a two-third to one-third  posterior flap was measured out.  A #10 blade was used to make an  incision.  Cautery was used to divide down to muscle.  The patient was  found to have a viable muscle, which appeared pink and healthy.  Based  on the quality of the muscle, I elected to proceed with a below-knee  amputation.  The tibia and fibula were circumferentially dissected free.  Periosteal elevator was used to elevate the periosteum.  A Gigli saw was  used to transect the tibia with anterior beveling.  Bone cutters were  used to cut the fibula.  The 3 neurovascular bundles were each  identified and individually ligated with 0 silk ties.  Wounds were then  copiously irrigated.  Hemostasis was achieved.  The fascia was then  reapproximated with running 2-0 Vicryl and oversewn  with interrupted 2-  0 Vicryl.  The skin was closed with staples.  Sterile dressings were  then applied, which were Xeroform, 4 x 4, Kerlix,  and Ace wrap.  The  dressing was then protected and attention was turned towards the groin.   The staples in the right groin were removed.  The previously placed  sutures were also removed.  A large seroma was identified and entered.  This was adequately drained.  The wound was then copiously irrigated,  and the groin was re-closed in 2 layers of Vicryl.  The skin was closed  again with staples.  Sterile dressings were applied.  The patient  tolerated the procedure well.  There were no complications.  He was  successfully extubated and taken to recovery room in stable condition.           ______________________________  V. Charlena Cross, MD  Electronically Signed     VWB/MEDQ  D:  11/25/2007  T:  11/26/2007  Job:  [id number]

## 2010-09-20 NOTE — Assessment & Plan Note (Signed)
OFFICE VISIT   RAYGEN, DAHM D  DOB:  1950-05-28                                       11/04/2007  ZOXWR#:[id number]   REASON FOR VISIT:  Evaluate right bypass graft.   HISTORY:  This is a 60 year old gentleman who is status post right  femoral to below knee popliteal artery bypass graft with vein for rest  pain in the right foot on 02/26/2007.  He was most recently seen in the  vascular lab on this past Friday and found to have a greater than 75%  stenosis of his distal anastomosis.  The patient states his pain is  almost unbearable now, and he feels that he has areas that could  potentially become ulcerated and open.   PHYSICAL EXAMINATION:  Blood pressure 122/76, pulse is 68.  On exam, he  is in no acute distress.  His right foot has intact sensation.  Motor  function is diminished but this is a chronic problem given his  myasthenia.  There is no active ulceration at this time.   DIAGNOSTIC STUDIES:  The patient's ultrasound was reviewed today.  This  reveals a drop in his ankle brachial index from 1 to 0.2.  He has a  greater than 75% stenosis at his distal anastomosis.   ASSESSMENT/PLAN:  Right leg vein bypass graft stenosis.   PLAN:  The patient is scheduled for an arteriogram this Wednesday, July  1st.  At that time, we will attempt to open his stenosis.  Please note  that the patient is HIV positive.   Jorge Ny, MD  Electronically Signed   VWB/MEDQ  D:  11/04/2007  T:  11/05/2007  Job:  [id number]

## 2010-09-20 NOTE — Op Note (Signed)
NAME:  Nathan [name], Nathan [name]               ACCOUNT NO.:  [id number]   MEDICAL RECORD NO.:  [id number]          PATIENT TYPE:  INP   LOCATION:  2040                         FACILITY:  [facility]   PHYSICIAN:  Juleen China IV, MDDATE OF BIRTH:  01/06/1951   DATE OF PROCEDURE:  11/12/2007  DATE OF DISCHARGE:                               OPERATIVE REPORT   PREOPERATIVE DIAGNOSIS:  Ischemic right foot.   POSTOPERATIVE DIAGNOSIS:  Ischemic right foot.   PROCEDURE PERFORMED:  1. Redo right femoral below-the-knee popliteal artery bypass graft      with 8-mm ring Propaten PTFE.  2. Injection of intra-arterial papaverine.  3. Thrombectomy of right peroneal artery.  4. Intraoperative angiogram x1.   SURGEON:  1. Charlena Cross, MD   ASSISTANT:  Jerold Coombe, PA   ANESTHESIA:  General.   ESTIMATED BLOOD LOSS:  500 mL.   COMPLICATIONS:  None.   INDICATIONS:  This is a 60 year old gentleman who previously had a right  femoral to below-knee popliteal artery bypass graft with vein performed  approximately 6 months ago.  He was found to have a high-grade stenosis  at the distal anastomosis.  Percutaneous attempt to revascularization  was performed.  This was complicated by thrombus within the graft  requiring lysis.  The graft was able to be opened, however, the vein was  noted to be very small.  Ultimately, even though the graft was able to  be opened with TPA, it reoccluded.  The patient was having severe pain  in his right foot as well as the beginnings of ulceration and for that  reason, I have decided to take him back to the operating room for redo  bypass with Gore-Tex.  The risks and benefits of the procedure were  discussed with the patient and informed consent was signed.   PROCEDURE IN DETAIL:  The patient was identified in the holding area and  taken to room 6.  He was placed supine on the table.  The right leg was  prepped and draped in standard sterile fashion.  A time-out  was called  and antibiotics were given.  I first began in the groin.  The patient's  previous incision was opened with a #10 blade.  Bovie cautery was used  to dissect through the subcutaneous tissue as well as the scar tissue.  There was dense scar tissue in the groin.  I initially identified the  vein bypass graft.  This was circumferentially exposed and encircled  with a vessel loop.  I then proceeded proximally up to the anastomosis  to the femoral artery.  Again, there was dense inflammatory reaction in  this area.  Multiple closure device clips were visualized on the femoral  artery likely contributing to the scar tissue in this area.  Ultimately,  the femoral artery was dissected free up to the level of the inguinal  ligament.  The profunda femoral artery was also isolated.  Once I was  satisfied of the exposure of the groin, I turned my attention towards  the below-knee incision.  The patient's previous incision was opened  with a #10 blade.  Cautery was used to dissect the subcutaneous tissue.  The crural fascia was then opened with cautery.  Gastrocnemius muscle  was identified and I reflected posteriorly.  The soleus muscle was taken  down off the posterior edge of the tibia for a distance of approximately  3 cm.  The popliteal space was then entered.  I was able to identify the  distal popliteal artery as well as the anterior tibial artery.  I then  proceeded proximally until I encountered the previous vein bypass graft.  At this point, I felt I had enough exposure of the below-knee popliteal  artery to perform a redo graft.  Next, a long tunneler was used to  create a tunnel posterior to the sartorius muscle.  Once the tunnel was  completed, a 8-mm ringed PTFE Propaten graft was selected and brought  through the tunneler which was then removed.  At this point, the patient  was significantly heparinized.  Vascular clamps were used to occlude the  common femoral and profunda  femoral arteries.  Using a #11 blade, the  vein bypass graft was removed from the femoral artery.  There was a  significant neointimal proliferation within the femoral artery and in  the bypass graft.  There was also a plaque extending down into the  profunda femoral artery.  Once I had completely removed the vein bypass  graft and femoral artery, I tried to clean the intima off the femoral  artery.  I did have to open down onto the profunda femoral artery for a  short distance.  A small flap had been raised and due to scarring in  this area, I elected to tack down the plaque going to the profunda with  interrupted 7-0 Prolene.  Once I was satisfied with the femoral artery,  I left the end of the Gore-Tex graft to fit the size of the arteriotomy  and performed a running anastomosis using 5-0 Prolene.  Once the  anastomosis was completed, the profunda and the femoral artery were  appropriately flushed.  The anastomosis was then secured.  Clamps were  then released.  There was excellent flow through the graft.  Two  pledgeted repair of sutures were placed, one in the toe of the graft and  one on the medial side.  Next, the graft was occluded at its proximal  portion and then flushed with heparinized saline.  A tourniquet was then  placed on the upper thigh and Esmarch was used to exsanguinate the leg  and the tourniquet was taken up to 250 mmHg.  The popliteal artery below  the knee was then opened with a #11 blade and extended with Potts  scissors.  The arteriotomy extended up to the takeoff of the anterior  tibial artery.  The graft was then cut to the appropriate length and  then the end beveled to fit the size of the arteriotomy.  A running  anastomosis was created using a running 6-0 Prolene.  Prior to  completion of the anastomosis, the tourniquet was taken down.  The graft  was appropriately flushed.  I was not happy with the back bleeding from  the peroneal artery and therefore  passed a #3 Fogarty catheter down the  peroneal artery.  It went down to the ankle.  No thrombus was evacuated.  Once I had passed this down, there was good back bleeding.  I then  secured the anastomosis.  At this point, I felt I needed  to take a  sheath and arteriogram since I had running catheter down the peroneal  artery.  I also injected 2 mg of intra-arterial papaverine.  Intra-  arterial arteriogram was performed.  This revealed a patent peroneal  artery with collateralization onto the foot.  This was the patient's  baseline runoff.  There was an area of luminal narrowing just distal to  the anastomosis.  I felt this most likely was corresponded with spasm  and elected to leave this alone.  Next, I elected not to reverse the  patient's heparin.  Both incisions were relatively dry.  They were  copiously irrigated.  The below-knee incision was closed by  approximating the crural fascia with a running 3-0 Vicryl.  Skin closed.  Staples were used to close the skin.  In the groin, this was closed in 3  layers with 2-0 and 3-0 Vicryl.  Staples were used to close the skin.  The patient did have a Doppler signal in his posterior tibial and  peroneal artery at the end of the case.  He was then successfully  awakened from anesthesia and taken to recovery room in stable condition.  At the end, there were no complications.           ______________________________  V. Charlena Cross, MD  Electronically Signed     VWB/MEDQ  D:  11/13/2007  T:  11/14/2007  Job:  [id number]

## 2010-09-20 NOTE — Assessment & Plan Note (Signed)
OFFICE VISIT   MENDELL, BONTEMPO D  DOB:  1950-10-01                                       06/08/2008  QMVHQ#:[id number]   REASON FOR VISIT:  Followup.   HISTORY:  This is a 60 year old gentleman with HIV who has undergone  failed attempt at right leg vascularization ultimately requiring right  above knee amputation.  This was performed on 11/22/2007.  He is  currently walking in a brace.  He does have some discomfort on the  weightbearing portion of the lateral aspect of his femur.  He also  complains of significant claudication in the left leg preventing him  from doing significant physical therapy.   PHYSICAL EXAMINATION:  Blood pressure is 155/68.  He is well-appearing  and in no distress.  There is a prominent area on the lateral aspect of  his bone.  There does not appear to be any sharp area.  The lateral  aspect is a little more prominent than the medial.  All the incisions  are healed.   DIAGNOSTIC STUDIES:  The patient's ankle brachial index has decreased  from 0.75 to 0.5 over the last 6 months on the left.   ASSESSMENT:  Lifestyle limiting claudication of the left leg.   PLAN:  The patient will be scheduled for an arteriogram to be performed  within the next 3-4 weeks.  If there is a lesion that is amenable to  percutaneous therapy we would proceed at that time.  Given that the  patient has not done well with stents and bypasses in the past any  complex lesion will not be treated.   Jorge Ny, MD  Electronically Signed   VWB/MEDQ  D:  06/08/2008  T:  06/10/2008  Job:  [id number]

## 2010-09-20 NOTE — Discharge Summary (Signed)
NAME:  JCEON, ALVERIO               ACCOUNT NO.:  [id number]   MEDICAL RECORD NO.:  [id number]          PATIENT TYPE:  INP   LOCATION:  3735                         FACILITY:  [facility]   PHYSICIAN:  Juleen China IV, MDDATE OF BIRTH:  1950/07/05   DATE OF ADMISSION:  07/02/2008  DATE OF DISCHARGE:  07/03/2008                               DISCHARGE SUMMARY   ADMISSION DIAGNOSIS:  Left leg claudication.   DISCHARGE DIAGNOSES:  1. Left leg claudication status post left PTA and stenting of the left      popliteal artery.  2. Postoperative acute blood loss anemia secondary to small right      groin hematoma.  3. Post procedure hypotension, I believe secondary to acute blood loss      anemia, stable.  4. History of human immunodeficiency virus.  5. Peripheral vascular disease with previous right femoral popliteal      artery bypass requiring multiple thrombectomies and revision with      thrombolysis, but also required a right above-knee amputation on      December 01, 2007.  6. History of hypertension.   PROCEDURES:  1. On July 02, 2008, abdominal aortogram with left lower extremity      run off.  2. Ultrasound access to the common right femoral artery.  3. Percutaneous transluminal angioplasty and stenting of left      popliteal artery.   BRIEF HISTORY OF PRESENT ILLNESS:  Mr. Math is a 60 year old  Caucasian male with a history of peripheral vascular disease as outlined  in his final discharge diagnosis.  He has had a right above-knee  amputation.  Recently, his therapy has been somewhat limited due to  developing claudication in his left leg.  Physical therapist felt he  should be further evaluated, and sent back to previous office.  Ankle/brachial indices were done showing a decrease in his ABI from 0.75  to 0.5 over the last 6 months.  Recommended arteriogram.  This was with  possible PTA and stenting.   HOSPITAL COURSE:  Mr. Craney was electively admitted to [facility] on July 02, 2008.  Initially, he was planned to go to home  following PTA and stenting of his left popliteal artery.  However, he  had significant pain in his right groin with a little swelling.  This  prompted a serial CBCs as well as a CT scan, which showed a relatively  small right groin hematoma.  His initial i-STAT hemoglobin was 13.9,  hematocrit of 41.4 followed by 12.4 and hematocrit of 36.3 with nearly  24-hour later CBC showing hemoglobin around 11.6 and hematocrit of 32.8,  which was felt overall stable.  He did have some hypotension  postprocedure with systolic in the 80s.  This was felt most likely  secondary to his acute blood loss anemia.  His lisinopril was placed on  hold.  His systolic blood pressure was up to 106.  By discharge, we did  instruct him to hold his lisinopril for the next 2 days.  Otherwise, Mr.  Turton had decreased pain in his right groin.  By February 26, he was  able to ambulate and had no further swelling in his right groin.  He had  a dominant popliteal pulse on the left, and the left side was warm to  touch.  He was subsequently felt stable for discharge on July 03, 2008, was felt to be in stable and improving condition.   DISCHARGE MEDICATIONS:  1. Omeprazole 20 mg daily.  2. Oxycodone 5 mg one to two tablets p.o. q.4 h. p.r.n. pain.  3. Lisinopril, he was instructed to hold this until following Monday,      unless he experienced any dizziness or lightheadedness.  He will      also check his blood pressure at the local drug store, and will      hold this if his systolic blood pressure is less than 110 and let      his primary physician know.  4. Combivir 300 mg nightly.  5. Viread 300 mg nightly.  6. Plavix 75 mg p.o. daily.  7. Isentress 400 mg nightly.   DISCHARGE INSTRUCTIONS:  To continue prehospitalization diet.  Increase  activity as tolerated.  Avoid heavy lifting for the next couple of  weeks.  Avoid driving  for the next day or so.  Call if he has increased  pain or swelling in his right groin or increasing pain in the left foot.  See Dr. Myra Gianotti in 3-4 weeks with ABIs, the office will contact him  regarding the specific appointment date and time.      Jerold Coombe, P.A.      Jorge Ny, MD  Electronically Signed    AWZ/MEDQ  D:  07/03/2008  T:  07/04/2008  Job:  [id number]   cc:   Jorge Ny, MD

## 2010-09-20 NOTE — Op Note (Signed)
NAME:  Nathan [name], Nathan [name]               ACCOUNT NO.:  [id number]   MEDICAL RECORD NO.:  [id number]          PATIENT TYPE:  AMB   LOCATION:  SDS                          FACILITY:  [facility]   PHYSICIAN:  VDurene Cal IV, MDDATE OF BIRTH:  08/06/1950   DATE OF PROCEDURE:  02/12/2007  DATE OF DISCHARGE:                               OPERATIVE REPORT   PREOPERATIVE DIAGNOSIS:  Severe right leg pain.   POSTOPERATIVE DIAGNOSIS:  Severe right leg pain.   PROCEDURE PERFORMED:  1. Aortogram.  2. Bilateral lower extremity runoff.  3. Second aorta cannulation.  4. Closure device (StarClose).   INDICATIONS FOR PROCEDURE:  This is a 60 year old gentleman with HIV who  has previously undergone stenting of his right superficial femoral and  popliteal artery.  He comes back with recurrence of his pain, which is  lifestyle limiting.  Risks and benefits of percutaneous intervention  were discussed with the patient, informed consent was signed and he is  willing to proceed.   PROCEDURE:  The patient was identified in the holding area and taken to  room 7.  He was placed supine on the table.  Bilateral groins were  prepped and draped in the standard sterile fashion.  Lidocaine 1% was  used for local anesthesia.  Using ultrasound the left common femoral  artery was accessed with an 18-gauge needle.  An  0.035 Wholey wire was  advanced in retrograde fashion into the abdominal aorta under  fluoroscopic visualization.  Next A 5-French sheath was placed.  Over  the wire an Omni Flush catheter was placed at the level at L1 and an  abdominal aortogram was obtained.  Next, using the Omni Flush catheter  and the [facility] wire the catheter was placed at the right external iliac  and right lower extremity runoff was obtained.  Next, through the sheath  a left leg aortogram was performed.   FINDINGS:  Aortogram:  The visualized portion of the suprarenal  abdominal aorta showed minimal disease.  There are single  renal arteries  bilaterally, which are widely patent.  The infrarenal abdominal aorta is  heavily calcified, however there are no hemodynamically significant  areas of stenosis.  The bilateral common iliac arteries are widely  patent.  Bilateral external iliac arteries are widely patent.  Bilateral  hypogastric arteries are widely patent.   Right lower extremity runoff:  The profunda femoral artery is patent.  The superficial femoral artery is occluded at its origin.  There is a  stent within the superficial femoral and one within the popliteal artery  visualized.  The superficial femoral artery stent is occluded.  There is  reconstitution from profunda collaterals of the distal SFA.  The  popliteal stent is patent, however there are areas of instant stenosis.  The popliteal artery behind the knee and the below the knee is patent  with a mild amount of disease.  The anterior tibial artery is occluded.  The posterior tibial perineal trunk and perineal artery are occluded.  The anterior tibial artery is the dominant runoff down to the ankle.  At  the ankle  there is reconstitution of the posterior tibial artery, which  supplies the foot.   Left lower extremity:  The left common femoral, superficial femoral and  profunda femoral artery are widely patent.  The popliteal artery is  widely patent.  The anterior tibial artery has a high grade stenosis  approximately 2.5 cm distal to its origin.  The tibial perineal trunk  over the perineal posterior artery occlude and there is a dominant  collateral of the mid-calf that appears to have reconstitute and gives  rise to the posterior tibial artery, which supplies the foot.   After the above images were obtained the decision was made to terminate  the case.  The patient was evaluated for closure device and found to be  adequate.  A StarClose was successfully deployed.  The patient was taken  to the recovery room in stable condition.    IMPRESSION:  Occluded right superficial femoral artery stent.  No  percutaneous options for revascularization.      Jorge Ny, MD  Electronically Signed     VWB/MEDQ  D:  02/12/2007  T:  02/12/2007  Job:  [id number]

## 2010-09-20 NOTE — Procedures (Signed)
DUPLEX DEEP VENOUS EXAM - LOWER EXTREMITY   INDICATION:  Left leg swelling.   HISTORY:  Edema:  No.  Trauma/Surgery:  PTA, left popliteal artery stent on 07/02/08 by Dr.  Myra Gianotti.  Pain:  No.  PE:  No.  Previous DVT:  No.  Anticoagulants:  No.  Other:   DUPLEX EXAM:                CFV   SFV   PopV  PTV    GSV                R  L  R  L  R  L  R   L  R  L  Thrombosis    o  o     o     o      o     o  Spontaneous   +  +     +     +      +     +  Phasic        +  +     +     +      +     +  Augmentation  +  +     +     +      +     +  Compressible  +  +     +     +      +     +  Competent     +  +     +     +      +     +   Legend:  + - yes  o - no  p - partial  D - decreased   IMPRESSION:  No evidence of deep or superficial vein thrombosis in left  lower extremity.    _____________________________  Janetta Hora Fields, MD   AC/MEDQ  D:  07/15/2008  T:  07/15/2008  Job:  [id number]

## 2010-09-20 NOTE — Op Note (Signed)
NAME:  Nathan [name], Nathan [name]               ACCOUNT NO.:  [id number]   MEDICAL RECORD NO.:  [id number]          PATIENT TYPE:  INP   LOCATION:  2010                         FACILITY:  [facility]   PHYSICIAN:  Juleen China IV, MDDATE OF BIRTH:  23-Mar-1951   DATE OF PROCEDURE:  12/04/2007  DATE OF DISCHARGE:  12/04/2007                               OPERATIVE REPORT   DIAGNOSIS:  Ischemic right below-knee amputation stump.   POSTOPERATIVE DIAGNOSIS:  Ischemic right below-knee amputation stump.   PROCEDURE PERFORMED:  Right above-knee amputation.   ANESTHESIA:  General.   BLOOD LOSS:  200 mL.   FINDINGS:  Viable muscle and skin.   INDICATIONS:  This is a 60 year old was recently status post below-knee  amputation which has failed to heal.  He comes for above-knee  amputation.  Risks and benefits were discussed.  Informed consent was  signed.   PROCEDURE:  The patient identified in the holding area and taken to room  6 and he was placed supine on the table.  General endotracheal  anesthesia was administered.  Time-out was called and antibiotics were  given.  A fishmouth incision was made above the knee.  Cautery was used  to dissect through the subcutaneous tissue.  The muscle was divided with  cautery.  Upon entering the neurovascular bundle, the artery was  occluded.  The vein and nerve were individually ligated.  The patient's  previous bypass graft was divided and transected in the proximal portion  of the wound.  Periosteal elevator was used to elevate the periosteum.  Gigli saw was used to transect the femur.  Cautery was used to perform  the remainder of the amputation.  I did had to remove an additional  piece of the femur with the Gigli saw in order to close the wound  without tension.  A rasp was used to smooth out the edges of the bone.  The nerve was then further transected proximal to the femur.  Hemostasis  was then achieved with Bovie cautery.  The fascia was then closed  with  interrupted 2-0 Vicryl.  The skin was closed with staples.  The patient  tolerated the procedure well.  There were no complications.           ______________________________  V. Charlena Cross, MD  Electronically Signed     VWB/MEDQ  D:  12/04/2007  T:  12/05/2007  Job:  [id number]

## 2010-09-20 NOTE — Op Note (Signed)
NAME:  JENNINGS, CORADO               ACCOUNT NO.:  [id number]   MEDICAL RECORD NO.:  [id number]          PATIENT TYPE:  INP   LOCATION:  3735                         FACILITY:  [facility]   PHYSICIAN:  Juleen China IV, MDDATE OF BIRTH:  1951-03-06   DATE OF PROCEDURE:  07/02/2008  DATE OF DISCHARGE:                               OPERATIVE REPORT   PREOPERATIVE DIAGNOSIS:  Left leg claudication.   POSTOPERATIVE DIAGNOSIS:  Left leg claudication.   PROCEDURES PERFORMED:  1. Ultrasound access, right femoral artery.  2. Abdominal aortogram.  3. Left lower extremity runoff.  4. Third order catheterization.  5. Percutaneous transluminal angioplasty, left popliteal artery.  6. Stent, left popliteal artery.  7. Conscious sedation x1 hour from 7:49 to 8:49.   PROCEDURE IN DETAIL:  The patient was identified in the holding and  taken to room 8.  He was placed supine on the table.  The right groin  was prepped and draped in a standard sterile fashion.  A time-out was  called.  The right femoral artery was evaluated with ultrasound and  found to be patent but calcified.  It was accessed under ultrasound  guidance with micropuncture needle.  An 0.018 mandril wire was advanced  into the iliac system without resistance until resistance was met and  micropuncture sheath was placed.  The wire was removed and a Bentson  wire was navigated into the aorta under fluoroscopic visualization and a  5-French sheath was placed.  Omniflush catheter was placed at the level  of L1 and abdominal aortogram was obtained.  Next, the catheter was  pulled down the aortic bifurcation and pelvic angiogram was obtained.  Using Bentson wire and Omni flush catheter, the aortic bifurcation was  crossed, the catheter was placed in the left external iliac artery, and  left leg runoff was obtained.   FINDINGS:  Aortogram:  The visualized portions of suprarenal abdominal  aorta showed minimal disease.  There are single  renal arteries  bilaterally which are widely patent.  The infrarenal abdominal aorta is  without significant occlusive disease.   Pelvic angiogram:  The right common iliac artery is widely patent.  The  right external iliac artery is diffusely diseased.  The left common and  external iliac arteries are all widely patent.  Bilateral hypogastric  arteries are all widely patent.   Left lower extremity:  The left common femoral artery is patent  throughout its course.  The left profunda femoral artery is widely  patent throughout its course.  The left superficial femoral artery is  widely patent.  There is a focal stenosis within the popliteal artery at  the level of the patella.  This has approximately 95% stenosis.  Remaining portions of the popliteal artery remained disease-free.  The  patient has significant tibial disease.  Dominant runoff vessel is the  anterior tibial artery which does have a focal lesion at its origin.  The peroneal and posterior tibial arteries are occluded.   INTERVENTION:  At this point in time, a decision was made to intervene.  Over a Rosen wire, a 6-French long  Terumo sheath was placed in the left  external iliac artery.  At this point, the patient was given systemic  heparinization.  Using a Glidewire and a 5-French slip catheter, the  lesion at the popliteal artery was successfully crossed.  A followup  injection was used to confirm successful crossing of the lesion.  A  Rosen wire was placed.  I then elected to perform primary balloon  angioplasty of this lesion.  A 6 x 2 Powerflex balloon was advanced  across the lesion.  It was taken at 12 atmospheres and held out for 90  seconds.  Followup angiogram revealed suboptimal result with visualized  dissection.  For this reason, I elected to stent this lesion.  A Cordis  7 x 30 self-expanding nitinol stent was then advanced across the lesion  and successfully deployed.  This was noted with confirmation with a  6-mm  balloon.  Followup angiogram revealed widely patent disease within the  popliteal artery.  Runoff was similar to pre-intervention.  At this  point, a decision was made to terminate the procedure.  Catheters and  wires were pulled back into the right iliac system.  The 40-cm sheath  was exchanged out for a regular length 6-French sheath.  The patient  tolerated the procedure well and there were no complications.   IMPRESSION:  1. High-grade left popliteal artery stenosis, approximately 95%,      initially treated with balloon angioplasty with suboptimal results      and subsequent percutaneous stenting requiring a 7 x 30 Cordis      stent.  2. Single-vessel runoff via the anterior tibial artery.           ______________________________  V. Charlena Cross, MD  Electronically Signed     VWB/MEDQ  D:  07/02/2008  T:  07/02/2008  Job:  [id number]

## 2010-09-20 NOTE — Op Note (Signed)
NAME:  Nathan [name], Nathan [name]               ACCOUNT NO.:  [id number]   MEDICAL RECORD NO.:  [id number]          PATIENT TYPE:  INP   LOCATION:  3307                         FACILITY:  [facility]   PHYSICIAN:  Juleen China IV, MDDATE OF BIRTH:  1950/07/07   DATE OF PROCEDURE:  11/15/2007  DATE OF DISCHARGE:                               OPERATIVE REPORT   PREOPERATIVE DIAGNOSIS:  Thrombosed femoral-peroneal artery bypass  graft.   POSTOPERATIVE DIAGNOSIS:  Thrombosed femoral-peroneal artery bypass  graft.   PROCEDURE PERFORMED:  1. Thrombectomy of femoral-peroneal artery bypass graft.  2. Thrombectomy of peroneal artery.  3. Intraoperative angiogram x1.  4. Intraarterial injection of papaverine.   ANESTHESIA:  General.   SURGEON:  1. Durene Cal IV, MD   BLOOD LOSS:  200 mL.   INDICATIONS:  This is a 60 year old gentleman who has previously  undergone a femoral to peroneal artery bypass graft earlier today.  Once  we had made a transfer to the floor, he began having recurrent pain in  his leg.  Ultrasound confirmed that his bypass graft is occluded, and he  was taken emergently to the operating room.  I had a long conversation  with the patient as well as his family detailing the severity of the  situation and indicating that limb loss was high likelihood that we  would do everything possible to restore circulation to his leg.  Informed consent was signed.   PROCEDURE NOTE:  The patient was identified in the holding area and  taken to room #9 where he was placed supine on the table.  General  endotracheal anesthesia was administered.  The patient was prepped and  draped in standard sterile fashion.  Antibiotics were given.  Staples  were removed from the patient's below-knee incision, and the sutures  were removed.  Retractors were used to expose the bypass graft.  The  bypass graft was found to be occluded.  A transverse arteriotomy was  made at the hood of the bypass graft.  A  #5 Fogarty was passed  retrograde into the bypass graft and thrombectomy was performed.  The  Fogarty was passed multiple times and excellent inflow was established.  Next, a 2-mm Fogarty catheter was advanced antegrade into the peroneal  artery.  This went without resistance down to the level of the ankle.  Multiple passes were made and thrombus was evacuated, and there was  excellent backbleeding.  The graftotomy was then closed.  Next, using a  butterfly needle, an access into the graft was performed.  Papaverine 2  mg was injected intra-arterially.  An intraoperative arteriogram was  then performed.  This reveals widely patent anastomosis as well as  peroneal artery with reconstitution of the posterior tibial artery at  the ankle.  At this point, it was unclear to me why the bypass graft had  occluded.  There was a slight luminal narrowing at the anastomosis;  however, I think this is more native disease in his artery than an  anastomotic problem.  Therefore, I elected to close the graftotomy made  for the arteriogram and prepared for  closure.  Please note, the patient  was on systemic heparinization during this procedure.  He did have a  good Doppler signal in his peroneal artery and his posterior tibial  artery at the ankle.  I did not see any intraoperative evidence as to  why the  bypass graft had occluded other than just native disease.  The patient  has single-vessel runoff via his peroneal artery which is heavily  diseased.  The wound was then irrigated.  The fascia was closed with a  running 2-0 Vicryl, and skin was closed with staples.  The patient was  then extubated and taken to recovery room in stable condition.           ______________________________  V. Charlena Cross, MD  Electronically Signed     VWB/MEDQ  D:  11/15/2007  T:  11/15/2007  Job:  [id number]

## 2010-09-20 NOTE — Assessment & Plan Note (Signed)
OFFICE VISIT   Nathan [name], Nathan [name]  DOB:  Oct 14, 1950                                       04/26/2007  JYNWG#:[id number]   The patient presents today for continued followup of his right femoral  popliteal bypass on October 21.  He continues to do well.  He has a  palpable popliteal pulse and all the surgical incisions are well-healed.  He does have a teeny punctate ulcer over the right lateral heel, and  does have a great deal of discomfort with this.  He does not have any  surrounding erythema or any evidence of infection.  I explained that  these occur and they are quite tender until they are totally healed, and  he was instructed on continued local care with cleaning and a small  amount of neosporin.  Otherwise, he is having no difficulty.  The  remainder of his foot looks good.  He does have some pretibial numbness,  and I explained that this is related to the vein harvest.  He also  showed me an area behind his right ear on the scalp where he shaves, he  does have what appears to be a superficial infection from an abrasion  with some surrounding erythema.  There is no flocculence.  I did write  him for Keflex 500 mg 1 t.i.d. for 10 days.  He will contact his primary  care doctor if this persists.  I did explain that, with his recent  hospitalization, there is concern that he could have resistant staph  related to hospitalization.  Otherwise, we will see him in 1 to 2 months  with continued protocol/followup for his fem-pop bypass.   Larina Earthly, M.D.  Electronically Signed   TFE/MEDQ  D:  04/26/2007  T:  04/29/2007  Job:  [id number]   cc:   Cliffton Asters, M.D.

## 2010-09-20 NOTE — Procedures (Signed)
LOWER EXTREMITY ARTERIAL EVALUATION-SINGLE LEVEL   INDICATION:  Right lower extremity pain.   HISTORY:  Diabetes:  No.  Cardiac:  No.  Hypertension:  No.  Smoking:  Yes.  Previous Surgery:  Right fem-pop artery BPG on 02/25/2007 by Dr. Arbie Cookey.   RESTING SYSTOLIC PRESSURES: (ABI)                          RIGHT                LEFT  Brachial:               126                  128  Anterior tibial:        116                  114  Posterior tibial:       130 (>1.0)           130 (>1.0)  Peroneal:  DOPPLER WAVEFORM ANALYSIS:  Anterior tibial:        Biphasic             Triphasic  Posterior tibial:       Biphasic             Triphasic  Peroneal:   PREVIOUS ABI'S:  Date: 05/20/2007  RIGHT:  1.01  LEFT:  0.87   IMPRESSION:  Normal ABIs bilaterally.   ___________________________________________  Di Kindle. Edilia Bo, M.D.   PB/MEDQ  D:  07/02/2007  T:  07/03/2007  Job:  [id number]

## 2010-09-20 NOTE — Assessment & Plan Note (Signed)
OFFICE VISIT   Nathan [name], Nathan [name]  DOB:  [birth year]                                       12/30/2007  [id number]#:[id number]   REASON FOR VISIT:  Followup.   The patient comes in today for staple removal.  His staples are removed.  The wound is healing nicely.  He is still having a fair amount of pain  at his stump site with only minimal phantom pain.  Hoping that this is  just related to the staples, which were again removed today.  I am  giving him an extra 50 of oxycodone to help with the pain.  He is being  referred to the prosthesis clinic.  I will see him back in 3 months.   Jorge Ny, MD  Electronically Signed   VWB/MEDQ  [name]:  12/30/2007  T:  12/31/2007  Job:  [id number]

## 2010-09-20 NOTE — Assessment & Plan Note (Signed)
OFFICE VISIT   OKEY, ZELEK  DOB:  Aug 12, 1950                                       02/22/2007  YQMVH#:[id number]   Here today for discussion regarding his needed fem-pop bypass.  He has a  fissure on his right heel, which is exquisitely tender to him.  I do not  see any evidence of surrounding erythema or any evidence of infection.  He does have Percocet but reports this makes him sick and has been given  Compazine for this by his medical doctor as well.  He tends to have a  very nicely developed saphenous vein and normal femoral pulse.  He is  for right fem-pop bypass for rest pain.  Scheduled for 10/21.   Larina Earthly, M.D.  Electronically Signed   TFE/MEDQ  D:  02/22/2007  T:  02/25/2007  Job:  [id number]

## 2010-09-20 NOTE — Procedures (Signed)
BYPASS GRAFT EVALUATION   INDICATION:  Left popliteal artery to posterior tibial artery stent.   HISTORY:  Diabetes:  Yes.  Cardiac:  No.  Hypertension:  Yes.  Smoking:  Previous.  Previous Surgery:  Left popliteal artery to posterior tibial artery  stent on 07/02/2008.  History of right below-knee amputation.   SINGLE LEVEL ARTERIAL EXAM                               RIGHT              LEFT  Brachial:                    124                126  Anterior tibial:                                117  Posterior tibial:                               108  Peroneal:  Ankle/brachial index:        BKA                0.93   PREVIOUS ABI:  Date: 05/24/09  RIGHT:  BKA  LEFT:  0.71   LOWER EXTREMITY BYPASS GRAFT DUPLEX EXAM:   DUPLEX:  Patent left popliteal artery to posterior artery stent with no  evidence of restenosis.  Triphasic waveforms noted throughout the lower  extremity.   IMPRESSION:  1. Patent left popliteal artery to posterior tibial artery stent with      no evidence of restenosis.  2. Left ankle brachial index is considerably better than previous      examinations with biphasic waveforms.   ___________________________________________  V. Charlena Cross, MD   NT/MEDQ  D:  11/29/2009  T:  11/29/2009  Job:  [id number]

## 2010-09-20 NOTE — Assessment & Plan Note (Signed)
OFFICE VISIT   Nathan [name], Nathan [name]  DOB:  06-07-1950                                       07/02/2007  GNFAO#:[id number]   The patient is a 60 year old gentleman who underwent a right femoral to  below knee pop bypass with a vein graft by Dr. Arbie Cookey for rest pain of  the right foot on 02/26/2007.  He has had gradual progression of pain in  his right foot and comes in today as an add-on.  He states that his pain  did not improve significantly after his bypass in October, and that the  pain has gradually progressed, and recently has become quite severe.  He  also noticed some discoloration in his right fifth toe.  I do not get  any history of claudication.  It sounds like a lot of his pain is  neuropathic pain.  He has had no history of non-healing ulcers.   REVIEW OF SYSTEMS:  He has had no recent chest pain, chest pressure,  palpitations, or arrhythmias.  He has had no bronchitis, asthma, or wheezing.   PHYSICAL EXAMINATION:  Blood pressure 161/84, heart rate is 79.  Lungs  are clear bilaterally to auscultation.  On cardiac exam, he has a  regular rate and rhythm.  He has a palpable femoral and popliteal pulse  on the right.  I cannot palpate pedal pulses, although he has fairly  brisk, albeit monophasic, posterior tibial and anterior tibial signal  with the Doppler.  The foot is ruborous.  He has no ischemic ulcers.  He  does have some slight discoloration of his right fifth toe.   I reviewed his previous preoperative arteriogram, which showed that he  had single vessel runoff via the anterior tibial artery on the right.   This patient had a study in January 12 of this year with an ABI of 100%  on the right and 87% on the left.  Followup ABI today showed no change  with ABI of 100% on the right.   I have reassured the patient that his bypass graft is patent.  He does  have tibial occlusive disease, but does have single vessel runoff, but  no further  options for revascularization.  I think a lot of his pain is  neuropathic, and he may benefit from Neurontin.  He is going to follow  up with his medical doctor to discuss this, and if necessary, will  follow up with Dr. Arbie Cookey.   Di Kindle. Edilia Bo, M.D.  Electronically Signed   CSD/MEDQ  D:  07/02/2007  T:  07/03/2007  Job:  [id number]   cc:   Cliffton Asters, M.D.

## 2010-09-20 NOTE — Procedures (Signed)
BYPASS GRAFT EVALUATION   INDICATION:  Left lower extremity claudication.   HISTORY:  Diabetes:  No.  Cardiac:  No.  Hypertension:  Yes.  Smoking:  Previous.  Previous Surgery:  Left popliteal artery PTA/stent 07/02/2008.  Right  BKA.   SINGLE LEVEL ARTERIAL EXAM                               RIGHT              LEFT  Brachial:                    119                131  Anterior tibial:                                104  Posterior tibial:                               86  Peroneal:  Ankle/brachial index:        BKA                0.79   PREVIOUS ABI:  Date: 08/03/2008  RIGHT:  BKA  LEFT:  0.80   LOWER EXTREMITY BYPASS GRAFT DUPLEX EXAM:   DUPLEX:  Patent left popliteal artery stent with no evidence of  significant stenosis.  Patent left common femoral, superficial femoral,  and popliteal arteries.   IMPRESSION:  1. Patent left popliteal artery stent.  2. Left ABI appears stable from previous study.  3. Right BKA.    ___________________________________________  V. Charlena Cross, MD   AS/MEDQ  D:  09/28/2008  T:  09/28/2008  Job:  [id number]

## 2010-09-20 NOTE — Discharge Summary (Signed)
NAME:  KDYN, VONBEHREN               ACCOUNT NO.:  [id number]   MEDICAL RECORD NO.:  [id number]          PATIENT TYPE:  INP   LOCATION:  2010                         FACILITY:  [facility]   PHYSICIAN:  Juleen China IV, MDDATE OF BIRTH:  02-Dec-1950   DATE OF ADMISSION:  11/06/2007  DATE OF DISCHARGE:  12/04/2007                               DISCHARGE SUMMARY   DISCHARGE DIAGNOSES:  1. Acute occlusion of right femoral-to-below-knee popliteal bypass.  2. Human immunodeficiency virus positive.  3. Thrush.  4. Peripheral vascular disease.  5. Hypercholesterolemia.   PROCEDURES PERFORMED:  1. Attempted lysis of bypass clot by Interventional Radiology with      numerous diagnostic angiographies.  2. Thrombectomy of right femoral-to-below-knee popliteal bypass graft      with thrombectomy of peroneal artery and revision of right femoral-      to-below-knee popliteal artery bypass graft on November 14, 2007.  3. On November 12, 2007, redo right femoral below-knee popliteal artery      bypass with 8-mm ring Propaten by Dr. Myra Gianotti on November 12, 2007.  4. On November 15, 2007, thrombectomy of the femoral to peroneal artery      bypass grafting by Dr. Myra Gianotti.  5. Right below-knee amputation, November 22, 2007.  6. Right above-knee amputation on December 01, 2007, all by Dr. Myra Gianotti.   COMPLICATIONS:  None.   DISCHARGE MEDICATIONS:  1. Diflucan 100 mg p.o. daily.  2. Percocet 5/325 1 p.o. q.4h. p.r.n. pain, total #40 was given.  3. Lipitor 40 mg p.o. daily.  4. Gabapentin 200 mg p.o. t.i.d.  5. Lexiva 700 mg p.o. b.i.d.  6. Viread 300 mg p.o. daily.  7. Norvir 100 mg p.o. b.i.d.  8. Combivir 150/300 mg p.o. b.i.d.  9. Avelox 400 mg p.o. daily for 7 days.   DISPOSITION:  He is being discharged to home in stable condition with  his wound healing well.  He was instructed to wrap the wound in Kerlix,  4x4s, and an Ace wrap daily.  Home health nursing is to do this every  other day for him.  He is to return to see  Dr. Myra Gianotti in 4 weeks.   BRIEF IDENTIFYING STATEMENT:  For complete details, please refer to the  typed history and physical.  Briefly, this very pleasant 60 year old man  presented to the emergency room with an acute vein occlusion with vein  graft stenosis of a right femoral-to-popliteal bypass graft.  He was  evaluated by Dr. Myra Gianotti who recommended angiography and further  evaluation and treatment.   HOSPITAL COURSE:  He was admitted to a bed through Interventional  Radiology.  He was started on lysis of medication for his popliteal  bypass occlusion.  He did receive numerous 5-mm diagnostic angiograms to  monitor the efficacy of treatment.  This was unsuccessful.  Eventually,  he came to redo femoral popliteal bypass grafting utilizing Propaten  material.  He continued to have occlusion and required two other  thrombectomies.  Dr. Myra Gianotti felt that he should undergo a right BKA.  He was informed of the risks and benefits of  the procedure, and after  careful consideration, he elected to proceed with surgery.  He was taken  to surgery and underwent the aforementioned BKA on October 23, 2007.  He  tolerated the procedure and was returned to the Post-Anesthesia Care  Unit extubated.  Following stabilization, he was transferred to a bed on  a surgical convalescent floor.   Postoperatively, he continued to complain of pain and discomfort.  He  has not had breakdown.  We felt him to be in need of revision to a right  AKA.  He was informed of the risks and benefits of the procedure, and  after careful consideration, he elected to proceed with surgery.  On  December 01, 2007, he was taken to the operating room and underwent a right  AKA.  He tolerated the procedure and was returned to the Post-Anesthesia  Care Unit extubated.  Following stabilization, he was transferred to a  bed on a surgical convalescent floor.  He remained pain free.  He was  ambulatory with the use of a walker.  On December 04, 2007, he was desirous  of discharge.  His vital signs were stable.  He was afebrile.  During  his hospitalization, he did develop thrush, which was clearing with the  use of Diflucan.  He was felt stable and was discharged home.   CONDITION AT DISCHARGE:  Stable.      Wilmon Arms, PA      V. Charlena Cross, MD  Electronically Signed    KEL/MEDQ  D:  12/04/2007  T:  12/04/2007  Job:  [id number]

## 2010-09-20 NOTE — Assessment & Plan Note (Signed)
OFFICE VISIT   EIN, RIJO  DOB:  11-04-1950                                       07/15/2008  AVWUJ#:[id number]   The patient was seen in the office today for left lower extremity leg  swelling.  He recently had a left lower extremity arteriogram with  angioplasty and stenting of the left popliteal artery done by Dr.  Myra Gianotti on February 25.  He states that over the last 3 days he has  developed progressive swelling of the left lower extremity.  He denies  any pain in his foot.  He denies any new claudication symptoms.  He  states that he was traveling recently and had the leg in a dependent  position for a fairly lengthy period of time.   He has previously had a right above knee amputation.  His past medical  history is also significant for HIV and hypercholesterolemia.   On physical exam today he has 2+ femoral pulses bilaterally.  There is  some ecchymosis still over the suprapubic region from his previous  arteriogram.  There is no significant swelling or evidence of  pseudoaneurysm in the right groin.  Left lower extremity is diffusely  edematous primarily in the calf but also with some in the left thigh.  There is one to two plus pitting edema of the left calf.  The left foot  is warm and pink and well-perfused.   He had a venous duplex exam today which showed no evidence of DVT.  Pedal pulses were not palpable today.   I am not sure of the etiology of the patient's left lower extremity  swelling.  He does not appear to have a DVT by ultrasound.  I did  explain to him that there can be several other causes for leg swelling.  His left leg also did not appear ischemic today.  I have ordered a BMET  and a CBC on him today in order to determine if he has any renal  insufficiency or any elevation of his white count indicating some sort  of infection.  He has followup scheduled with Dr. Myra Gianotti in the next  couple weeks.  Will forward these lab  results to Dr. Myra Gianotti as well so  he can review these and if there are any abnormalities review this with  the patient.   Janetta Hora. Fields, MD  Electronically Signed   CEF/MEDQ  D:  07/15/2008  T:  07/16/2008  Job:  [id number]   cc:   Jorge Ny, MD

## 2010-09-20 NOTE — Assessment & Plan Note (Signed)
OFFICE VISIT   AMADEO, COKE D  DOB:  [birth year]                                       12/16/2007  FAOZH#:[id number]   REASON FOR VISIT:  Followup.   HISTORY:  This is a 60 year old gentleman who underwent femoral to below  knee popliteal artery bypass graft with vein for rest pain on  02/26/2007.  He was found to have a vein graft distal stenosis.  There  was attempt at percutaneous revascularization.  The patient ended up  requiring thrombolysis to deal with the thrombosed graft.  His vein was  found to be of small caliber at the time of his angiogram.  He  ultimately underwent multiple revisions and redo bypasses, which  ultimately failed.  The patient only had single-vessel runoff, which was  a diseased peroneal.  He then underwent below the knee amputation, which  did not heal.  This ultimately required conversion to an above knee  amputation on December 02, 2007.  He comes back in today for his first  followup.  He has been doing well at home.  However, he is still having  complaints of pain.  He denies having any significant phantom pain.   EXAM:  His stump is healing nicely.  His staples are still in place.  His right groin also has staples in place and is nicely healed.  There  is no evidence of infection.   PLAN:  I am going to see the patient back in 2 weeks.  At that time, we  will take his staples out of his below knee stump.  I am giving him 40  of Dilaudid to see if this will alleviate some of his pain.   Jorge Ny, MD  Electronically Signed   VWB/MEDQ  D:  12/16/2007  T:  12/17/2007  Job:  [id number]

## 2010-09-20 NOTE — Assessment & Plan Note (Signed)
OFFICE VISIT   Nathan [name], Nathan [name]  DOB:  [birth year]                                       02/04/2008  ZOXWR#:[id number]   Patient comes in today asking for a note of clarification.  He is a 60-  year-old with HIV, hypercholesterolemia, and tobacco abuse, who  unfortunately had failed attempt at bypass and underwent above-knee  amputation on the right.   I feel that his advanced atherosclerotic disease is multifactorial.  This includes his tobacco abuse, his elevated cholesterol, as well as  the use of his antiretroviral medication.   Jorge Ny, MD  Electronically Signed   VWB/MEDQ  D:  02/03/2008  T:  02/04/2008  Job:  [id number]   cc:   [facility]

## 2010-09-20 NOTE — Op Note (Signed)
NAME:  REINHARDT, LICAUSI               ACCOUNT NO.:  [id number]   MEDICAL RECORD NO.:  [id number]          PATIENT TYPE:  OIB   LOCATION:  5128                         FACILITY:  [facility]   PHYSICIAN:  VDurene Cal IV, MDDATE OF BIRTH:  22-Oct-1950   DATE OF PROCEDURE:  DATE OF DISCHARGE:                               OPERATIVE REPORT   DIAGNOSES:  Right leg rest pain, vein graft stenosis.   POSTOPERATIVE DIAGNOSES:  Right leg rest pain, vein graft stenosis.   PROCEDURE PERFORMED:  1. Ultrasound access left common femoral artery.  2. Right lower extremity runoff.  3. Second-order catheterization.   PROCEDURE:  The patient was identified in the holding area and taken to  room.  He was placed supine on the table.  Bilateral groins were prepped  and standard sterile fashion.  A time-out was called.  Lidocaine 1% was  used for local anesthesia.  The left common femoral was evaluated by  ultrasound and found to be patent.  It was accessed under ultrasound  guidance an 18-gauge needle.  A Bentson wire was advanced into the aorta  under fluoroscopic sterilization and a 5-French sheath was placed.  Over  the wire, an Omni flush catheter was placed at the level L1 and  abdominal aortogram was obtained.  Next, using a Glidewire, and the end-  hole catheter, and an Omni flush catheter, the 4-French end-hole was  able to be advanced into the right external iliac artery and a right leg  runoff was obtained.   FINDINGS:  Aortogram:  The visualized portions of suprarenal abdominal  aorta showed minimal disease.  There are single renal arteries  bilaterally which are widely patent.  The infrarenal abdominal aorta  showed minimal disease.  The left common iliac and external iliac artery  are widely patent with minimal disease.  Left hypogastric artery is  widely patent.  The right common iliac and external iliac arteries are  widely patent with minimal disease.  Right hypogastric artery is widely  patent.   Right lower extremity:  The right common femoral artery is visualized  and it is patent.  The right superficial femoral artery is occluded.  The right profunda femoral artery is widely patent.  A bypass graft is  visualized originating from the distal common femoral artery with its  anastomosis in the below-knee popliteal artery.  There is a high grade  approximately 99% stenosis at the level of the anastomosis.  There is a  single-vessel runoff via the peroneal artery.  There is minimal contrast  visualized on the lateral aspects of the foot.   INTERVENTION:  At this point in time, I elected to proceed with  intervention on the vein graft stenosis.  A Rosen wire was placed into  the right common femoral artery and an attempt was made to place a 6-  [place] Terumo sheath.  I was unable to pass the sheath secondary to scar  tissue and then therefore the tract was dilated with a 7-French dilator.  Next, once the sheath was advanced, I pulled the sheath down and placed  the 4-French  catheter back into the right external iliac artery.  The  Rosen wire was removed.  An Amplatz superstiff wire was placed.  In  trying to get the 4-French catheter out, it also got stuck and actually  snapped in half for fear of losing the catheter, the wire and catheter  were removed together.  I was unable to maintain wire access, therefore  at this time even though the patient did not receive heparin, I gave him  15 mg of protamine and held manual pressure.  I did not feel that  proceeding with antegrade access was a good idea at this time given the  complications he had in left groin.  The patient will be admitted and we  will plan from antegrade access tomorrow.  There was no evidence of  hematoma during the case.   IMPRESSION:  1. 99% stenosis at the distal anastomosis of the femoral to below-knee      popliteal artery bypass graft vein.  2. Single-vessel runoff.  3. The patient will be brought  back tomorrow for intervention from an      antegrade approach.           ______________________________  V. Charlena Cross, MD  Electronically Signed     VWB/MEDQ  D:  11/06/2007  T:  11/06/2007  Job:  [id number]

## 2010-09-20 NOTE — Assessment & Plan Note (Signed)
OFFICE VISIT   Nathan [name], Nathan [name]  DOB:  1950-09-27                                       03/23/2008  ZHYQM#:[id number]   HISTORY:  This is a 60 year old gentleman with HIV who has undergone a  failed attempt at right leg revascularization, ultimately requiring a  right above-knee amputation.  This was performed on November 22, 2007.  The  patient comes back in today for follow-up.  He is being fitted for an  above-knee prosthesis which has not yet been placed.  We discussed today  the possibility of a powered wheelchair.  We discussed home activities  that he is not able to complete, mainly these involve activities that  involve using his hands while he is trying to balance himself.  Specifically, he states cooking.  He has trouble with a cane or walker,  he can get around using crutches, however he cannot use his hands due to  balance issues.  A manual wheelchair could potentially be an option.   PHYSICAL EXAMINATION:  His weight is 160 pounds.  Blood pressure is  138/69, pulse is 67.  General:  He is well-appearing, no distress.  Cardiovascular:  Regular rate and rhythm.  Right leg stump:  Well-  healed, there are no open areas.   ASSESSMENT/PLAN:  Status post right leg amputation.   Plan:  The patient is doing well at this time, he has minimal complaints  of pain.  He is in the process of being fitted for his prosthesis.  He  is going to follow up with me in a year and p.r.n. if needed.   With regards to his left leg, he states that he does have some  discomfort; however, I do not feel this is significant to warrant  intervention as he has tibial disease in the left leg.   Juleen China IV, MD  Electronically Signed   VWB/MEDQ  D:  03/23/2008  T:  03/24/2008  Job:  [id number]

## 2010-09-20 NOTE — Assessment & Plan Note (Signed)
OFFICE VISIT   EDRIK, RUNDLE D  DOB:  1950-12-25                                       03/22/2007  UXLKG#:[id number]   Overton Boggus is in today for follow-up of his right fem-pop bypass on  02/26/07 with translated reverse saphenous vein.  He continues to do  quite well.  He does continue to have pain in the ulcerative area of his  right lateral heel.  His incisions are all healing quite nicely.  He has  a normal 2+ femoral and normal 2+ popliteal pulse.  He has the usual  amount of postop swelling.  His ankle arm index is normal on the right.  He will continue his activity as tolerated and I plan to see him again  in one month for continued follow-up.   Larina Earthly, M.D.  Electronically Signed   TFE/MEDQ  D:  03/22/2007  T:  03/25/2007  Job:  [phone]

## 2010-09-20 NOTE — Assessment & Plan Note (Signed)
OFFICE VISIT   CHARLIS, HARNER D  DOB:  March 12, 1951                                       02/18/2007  ZOXWR#:[id number]   REASON FOR VISIT:  Severe right leg pain.   HISTORY:  This is a 60 year old gentleman with a history of percutaneous  stenting of his right superficial femoral and popliteal artery several  years ago secondary to right leg pain.  He was recently seen by Dr.  Arbie Cookey for recurrence of his pain, as well as increasing in the severity.  He subsequently underwent an arteriogram, which showed thrombosis of the  right superficial femoral artery stent.  He has a stent noted within his  popliteal artery just proximal to the knee.  He had an acceptable below  knee popliteal target.  The patient comes back in today as an  unscheduled visit secondary to pain and discomfort, as well as  discoloration of his right foot.  He states that this has been getting  significantly worse over the past couple days and is not as responsive  to narcotics.   REVIEW OF SYSTEMS:  Negative for chest pain, shortness of breath.  GASTROINTESTINAL:  Negative.  GENITOURINARY:  Negative.  All other systems negative.   PAST MEDICAL HISTORY:  Significant for hypercholesterolemia, HIV.   FAMILY HISTORY:  Positive for atherosclerotic disease in his mother and  his father.   SOCIAL HISTORY:  Positive for tobacco, one-and-a-half packs per day.  No  alcohol.   ALLERGIES:  Sulfa drugs.   MEDICATIONS:  Please see medical record.   PHYSICAL EXAMINATION:  Heart rate is 75, respirations 18, blood pressure  135/75.  In general, he is well-appearing in mild distress.  Cardiovascular is regular.  Respirations nonlabored.  Abdomen is soft.  Extremities, the right is cooler than the left.  There is slight  discoloration of the fifth digit.  Pulses are not palpable.  Motor and  sensory function are intact.  There is no evidence of infection.  There  are no open ulcers.  There is a  prominent greater saphenous vein from  the ankle up to the mid thigh.   ASSESSMENT AND PLAN:  Right leg rest pain.   PLAN:  The patient will be scheduled for a right below knee femoral  popliteal bypass graft with vein.  Risks and benefits of this procedure  were discussed with the patient.  All questions were answered.  He has  been scheduled to undergo this procedure on August 20 with Dr. Arbie Cookey.   Jorge Ny, MD  Electronically Signed   VWB/MEDQ  D:  02/18/2007  T:  02/19/2007  Job:  [id number]

## 2010-09-20 NOTE — Op Note (Signed)
NAME:  Nathan [name], Nathan [name]               ACCOUNT NO.:  [id number]   MEDICAL RECORD NO.:  [id number]          PATIENT TYPE:  INP   LOCATION:  3309                         FACILITY:  [facility]   PHYSICIAN:  Larina Earthly, M.D.    DATE OF BIRTH:  1951/03/30   DATE OF PROCEDURE:  02/26/2007  DATE OF DISCHARGE:                               OPERATIVE REPORT   PREOPERATIVE DIAGNOSIS:  Rest pain and tissue loss in right foot.   POSTOPERATIVE DIAGNOSIS:  Rest pain and tissue loss in right foot.   PROCEDURE:  Right femoral to below-knee popliteal bypass with  translocated reversed saphenous vein.   SURGEON:  Larina Earthly, M.D.   ASSISTANT:  Jerold Coombe, P.A.   ANESTHESIA:  General endotracheal.   COMPLICATIONS:  None.   DISPOSITION:  To the recovery room stable.   PROCEDURE IN DETAIL:  The patient was taken to the operating room and  placed in the supine position, where the area of the right groin and  right leg were prepped and draped in the usual sterile fashion.  Incision was made over the femoral pulse, carried down to isolate the  common, superficial femoral and profunda femoris arteries.  The  saphenous vein was identified at that saphenofemoral junction.  The  saphenous vein branched immediately with an anterior and a medial  branch.  The patient had a very obvious branch in the subcutaneous  tissue.  This was exposed through a separate incision at the level of  the calf and the vein was harvested from the calf all the way up to the  groin.  The vein did divide just below the knee.  The larger of these  two veins was the more superficial anterior branch.  The vein was  harvested and multiple 3-0 and 4-0 silk ties were used for ligation of  tributary side branches.  The below-knee popliteal artery was exposed  through the same vein harvest incision.  The vein was gently dilated and  was small but felt to be adequate size for bypass.  A tunnel was created  from the level of the  popliteal space to the groin.  The patient was  given 7000 units of intravenous heparin.  After adequate circulation  time, the common, superficial femoral and profunda femoris arteries were  occluded.  The common femoral artery was opened with an 11 blade,  extended longitudinally with Potts scissors.  The vein was reversed,  slightly spatulated, and was sewn end-to-side to the common femoral  artery-superficial femoral artery junction with a running 6-0 Prolene  suture.  The anastomosis was tested and found to be adequate.  The vein  graft was then brought through the prior subcutaneous tunnel down the  level of the knee.  The knee was straightened to assure adequate length  of the vein.  A pneumatic tourniquet was inflated in the thigh and the  below-knee popliteal artery was opened.  This was extended  longitudinally.  There was some thickening in the artery but was  adequate for runoff.  The vein was cut to the appropriate length, sewn  end-to-side to the artery with a running 6-0 Prolene suture.  The  tourniquet was deflated and there was a Doppler signal in the foot.  There did not appear to be as good a pulse as should be in the vein and  with further manipulation, it was felt that there was some redundancy in  the vein.  For this reason the below-knee popliteal artery was again  exposed, was occluded proximal and distally.  The vein graft was  occluded and the anastomosis was taken down.  The vein was shortened and  again was spatulated and sewn end-to-side to the artery.  This time  there was better flow and good Doppler signal in the foot.  The patient  was given 50 mg of protamine to  reverse the heparin.  The wounds were irrigated with saline.  Hemostasis  with electrocautery.  Wounds were closed with 2-0 and 3-0 Vicryl in the  subcutaneous tissues, skin was closed with skin clips, sterile dressing  was applied, and the patient was taken to the recovery room in stable   condition.      Larina Earthly, M.D.  Electronically Signed     TFE/MEDQ  D:  02/26/2007  T:  02/27/2007  Job:  [id number]

## 2010-09-20 NOTE — Procedures (Signed)
BYPASS GRAFT EVALUATION   INDICATION:  Followup right lower extremity bypass graft with  progressing pain and right lower extremity red and purple.   HISTORY:  Diabetes:  No.  Cardiac:  No.  Hypertension:  No.  Smoking:  Yes.  Previous Surgery:  Right femoral-popliteal artery bypass graft on  02/25/2007 by Dr. Arbie Cookey.   SINGLE LEVEL ARTERIAL EXAM                               RIGHT              LEFT  Brachial:                    164                162  Anterior tibial:             29                 121  Posterior tibial:            46                 124  Peroneal:                    44                 120  Ankle/brachial index:        0.28               0.76   PREVIOUS ABI:  Date:  07/02/2007  RIGHT:  1.02  LEFT:  1.02   LOWER EXTREMITY BYPASS GRAFT DUPLEX EXAM:   DUPLEX:  Doppler arterial waveforms appear biphasic proximal to,  biphasic and monophasic within and monophasic distal to bypass graft.  Elevated velocities in distal anastomosis of 425 cm/s suggestive of >  75% stenosis.   IMPRESSION:  1. Right femoral-popliteal artery bypass graft shows evidence of > 75%      stenosis at distal anastomosis.  2. Right ABI has decreased significantly.  3. Left ABI has decreased (however, ABI 05/20/2007 was 0.87).  4. Dr. Myra Gianotti was notified and an appointment was made to see him.   ___________________________________________  Larina Earthly, M.D.   AS/MEDQ  D:  11/01/2007  T:  11/01/2007  Job:  [id number]

## 2010-09-20 NOTE — Op Note (Signed)
NAME:  Nathan [name], Nathan [name]               ACCOUNT NO.:  [id number]   MEDICAL RECORD NO.:  [id number]          PATIENT TYPE:  INP   LOCATION:  3307                         FACILITY:  [facility]   PHYSICIAN:  Juleen China IV, MDDATE OF BIRTH:  [birth year]   DATE OF PROCEDURE:  11/14/2007  DATE OF DISCHARGE:                               OPERATIVE REPORT   PREOPERATIVE DIAGNOSIS:  Occluded right femoral below-knee popliteal  artery bypass graft.   POSTOPERATIVE DIAGNOSIS:  Occluded right femoral below-knee popliteal  artery bypass graft.   PROCEDURE PERFORMED:  1. Thrombectomy of right femoral below-knee popliteal artery bypass      graft.  2. Thrombectomy of peroneal artery.  3. Revision of right femoral below-knee popliteal artery bypass graft      to a right femoral to peroneal artery bypass graft with 6 mm      Propaten PTFE.   TYPE OF ANESTHESIA:  General.   SURGEON:  1. Charlena Cross, MD   ASSISTANT:  Jerold Coombe, PA.   BLOOD LOSS:  500 mL.   COMPLICATIONS:  None.   INDICATIONS:  This is a 60 year old gentleman who had a right femoral to  below-knee popliteal artery bypass graft with vein, which was found on  ultrasound surveillance to have a high-grade stenosis.  Angiogram  confirmed the stenosis and during the angiogram, the bypass graft  occluded.  He was run on thrombolysis therapy, which was unsuccessful.  He later underwent a right femoral to below knee popliteal artery bypass  graft with 8 mm Propaten PTFE.  The patient began having symptoms  earlier this morning and was found to have an occluded bypass graft.  He  comes back in for revision.  Risks and benefits were discussed with the  patient.  Informed consent was signed.   PROCEDURE:  The patient identified the holding area and taken to the  operating room.  He was placed supine on the table.  General  endotracheal anesthesia was administered.  He was prepped and draped in  standard sterile fashion.   Time-out was called and antibiotics were  given.  The below-knee incision and groin incision were opened and the  bypass graft was exposed.  Initially, began below the knee.  A  transverse graftotomy was made and #5 Fogarty was used to perform  thrombectomy of the inflow.  This was easily accomplished and the bypass  graft was then reoccluded.  I attempted to perform thrombectomy of the  tibioperoneal trunk and the peroneal artery, which was his single-vessel  runoff.  I was unable to advance the catheter from the below-knee  incision and to the peroneal artery.  I then tried to pass the 3-mg #2  Fogarty from the groin incision thinking this would have a better angle  however, this also made resistance.  Based on the patient's  intraoperative angiogram, I felt that there was likely outflow stenosis  within the tibioperoneal trunk.  I therefore, elected to further dissect  out the peroneal artery.  Approximately, 3 cm of the peroneal artery  were mobilized.  The posterior tibial artery  was identified, which was  noted to be occluded.  The posterior tibial artery was then transected  to better visualize the peroneal artery.  I began opening the  tibioperoneal trunk longitudinally with Potts scissors down on to the  peroneal artery.  There appeared to thickened intima at the origin of  the peroneal artery and therefore, I proceeded with further opening of  the peroneal artery until a satisfactory lumen was encountered.  Based  on the width of the new arteriotomy and the fact there was no back  bleeding from the tibioperoneal trunk and below-knee popliteal artery, I  elected to perform an end-to-end bypass graft rather than a long patch  angioplasty.  A 6-mm Propaten graft was then sewn end-to-end to the  previous 8 mm PTFE graft.  This was done with a running CV-6 Gore  suture.  Once this anastomosis was completed, the graft was flushed and  found to be widely patent with excellent inflow.  I  then spatulated the  6-mm Propaten PTFE graft and performed an end-to-end anastomosis to the  peroneal artery with a running 6-0 Prolene suture.  Prior to completion  of the anastomosis, #2 Fogarty was passed down the peroneal artery  without resistance.  There was excellent backbleeding.  I then flushed  the graft above, again there was excellent inflow and no thrombus.  The  anastomosis was then secured.  The entire procedure was done with the  patient on systemic heparinization.  I then evaluated the peroneal  artery with a Doppler and had an excellent signal.  At this point, I  elected not to perform an arteriogram as I did not feel that the patient  had further options for limb salvage.  Also, with a good signal, I did  not feel further revision would be possible.  For this reason, I elected  to stop the procedure at this time.  Both wounds were copiously  irrigated.  Once the hemostasis was adequate, the fascia was closed and  skin was closed with staples.  In the groin, the wound was closed in 3  layers, and staples were used on the skin.  The patient was taken to  recovery room in stable condition.  There were no complications.           ______________________________  V. Charlena Cross, MD  Electronically Signed     VWB/MEDQ  D:  11/15/2007  T:  11/15/2007  Job:  [id number]

## 2010-09-20 NOTE — Discharge Summary (Signed)
[name], [name]               ACCOUNT NO.:  [id number]   MEDICAL RECORD NO.:  [id number]          PATIENT TYPE:  INP   LOCATION:  2037                         FACILITY:  [facility]   PHYSICIAN:  Larina Earthly, M.D.    DATE OF BIRTH:  May 26, 1950   DATE OF ADMISSION:  02/26/2007  DATE OF DISCHARGE:  03/01/2007                               DISCHARGE SUMMARY   DICTATED BY:  Jerold Coombe, P.A.   ADMISSION DIAGNOSES:  Peripheral vascular disease with rest pain and  tissue loss in right foot.   DISCHARGE/SECONDARY DIAGNOSES:  1. Peripheral vascular disease with rest pain and tissue loss in right      foot status post right femoral popliteal artery bypass.  2. Right foot drop.  3. History of HIV.  4. Hypercholesterolemia.  5. Ongoing tobacco abuse.  6. History of percutaneous stenting of his right superficial femoral      and popliteal artery several years ago.  7. Allergy to SULFA and CODEINE.  8. He has history of multiple sclerosis and bilateral peripheral      neuropathy.  9. Postoperative hyperglycemia with normal admission of blood glucose.   PROCEDURES:  February 26, 2007 - right femoral tibial new popliteal  artery bypass using translocated reverse saphenous vein from the right  leg.  Surgeon Dr. Tawanna Cooler Early.   BRIEF HISTORY AND PHYSICAL:  Nathan [name] is a 60 year old Caucasian  male, who Dr. Arbie Cookey first saw on October 3rd, 2008 for severe right leg  pain.  He was status post a percutaneous stenting of his right SFA and  popliteal arteries several years ago secondary to right leg pain.  He  now had recurrence of his pain.  He subsequently underwent arteriogram,  which showed thrombosis of the right superficial femoral artery stent.  He was felt to be a candidate for a right femoral popliteal artery  bypass grafting.  However, in the meantime, he developed significantly  worse pain requiring narcotic therapy.  He did have a fissure on his  right heel, but no evidence of  surrounding erythema or evidence of  infection.   CONSULTS:  Physical therapy.   HOSPITAL COURSE:  Nathan [name] was admitted to [facility] on  February 26, 2007 and underwent the previously mentioned procedure.  Postoperatively his ABIs were 0.94 on the right and 1.10 on the left.  He did have right foot drop, but has been able to mobilize independently  with the use of a walker.  Physical Therapy has been asked to evaluate  him part of discharge, to ensure safety.  The pain has been controlled  on oral medication.  His right foot has remained well perfused.  He has  had some right heel pain, which was present preoperatively as well.  We  have recommended that he keep that pressure off his heel as much as  possible.  There is no gross evidence of cellulitis at this time.  He  has remained hemodynamically stable, with latest vitals showing blood  pressure 105/66, heart rate 64.  He has been afebrile, saturating 90% to  97% on room air.  He has been voiding following the removal of the Foley  catheter and incision had been healing well, without signs of infection.  His latest labs show a white count of 9.7, hemoglobin 11.2, hematocrit  33.1, platelet count 180, sodium 137, potassium 4.0, BUN of 10 and  creatinine of 0.82.  His preoperative blood sugar was normal at 103.  LFTs were normal preoperatively as well.  If Nathan [name] does well with  Physical Therapy, we anticipate he will be ready for discharge on  postoperative day 3, March 01, 2007.  If otherwise, we will keep him  another 24 hours or so, to ensure safety prior to discharge.   DISCHARGE MEDICATIONS:  1. Pravastatin 40 mg p.o. b.i.d.  2. Oxycodone 5 mg, 1 to 2 tablets p.o. every 4 hours p.r.n. pain.  3. Lexiva 700 mg p.o. b.i.d.  4. Biread 300 mg p.o. daily.  5. Norvir 100 mg p.o. b.i.d.  6. Combivir 150/300 mg, 1 tablet p.o. b.i.d.  7. He also has Phenergan 25 mg p.o.  He uses p.r.n. nausea and      vomiting at  home.   DISCHARGE INSTRUCTIONS:  Continue a heart healthy diet.  Increase  activity slowly.  Must shower and clean his incision gently with soap  and water.  No driving or heavy lifting for the next 2-3 weeks.  He is  to call if he develops fever greater than 101 or redness or drainage  from his incision site or increased pain.  He has a follow up for staple  removal in 7-10 days at the vascular main specialist office and should  have a 3-week follow up appointment with postoperative ABIs.  To see Dr.  Arbie Cookey.  Our office will contact him regarding a specific appointment  date and time.      Larina Earthly, M.D.  Electronically Signed     TFE/MEDQ  D:  03/01/2007  T:  03/02/2007  Job:  [id number]   cc:   Larina Earthly, M.D.  Cliffton Asters, M.D.

## 2010-09-20 NOTE — Procedures (Signed)
BYPASS GRAFT EVALUATION   INDICATION:  Follow up right lower extremity bypass graft, still with  right lower extremity rest pain.   HISTORY:  Diabetes:  No.  Cardiac:  No.  Hypertension:  No.  Smoking:  Yes.  Previous Surgery:  Status post right femoral-popliteal artery bypass  graft 02/25/2007, by Dr. Arbie Cookey.   SINGLE LEVEL ARTERIAL EXAM                               RIGHT              LEFT  Brachial:                    132                131  Anterior tibial:             136                102  Posterior tibial:            115                115  Peroneal:  Ankle/brachial index:        1.03               0.87   PREVIOUS ABI:  Date:  03/22/2007  RIGHT:  1.01  LEFT:  0.87   LOWER EXTREMITY BYPASS GRAFT DUPLEX EXAM:   DUPLEX:  Doppler arterial waveforms appear biphasic proximal to within  and distal to bypass graft.   IMPRESSION:  1. Patent right femoral-popliteal artery bypass graft.  2. Bilateral ankle-brachial indices appear stable from previous study.   ___________________________________________   Larina Earthly, M.D.  AS/MEDQ  D:  05/20/2007  T:  05/21/2007  Job:  [id number]

## 2010-09-20 NOTE — Op Note (Signed)
NAME:  Nathan [name], Nathan [name]               ACCOUNT NO.:  [id number]   MEDICAL RECORD NO.:  [id number]          PATIENT TYPE:  INP   LOCATION:  2012                         FACILITY:  [facility]   PHYSICIAN:  Juleen China IV, MDDATE OF BIRTH:  [birth year]   DATE OF PROCEDURE:  11/07/2007  DATE OF DISCHARGE:                               OPERATIVE REPORT   PREOPERATIVE DIAGNOSIS:  Right femoral-popliteal vein graft stenosis.   POSTOPERATIVE DIAGNOSIS:  Right femoral-popliteal vein graft stenosis.   PROCEDURE PERFORMED:  1. Ultrasound-guided access, right common femoral artery.  2. Placement of a catheter for thrombolysis.  3. Injection of TPA.   PROCEDURE:  The patient was identified in the holding area and taken to  the room #8.  He was placed supine on the table.  Bilateral groins were  prepped and draped in a standard sterile fashion.  A time-out was  called.  The right common femoral artery was evaluated with ultrasound  and was found to be patent.  Antegrade access was obtained under  ultrasound guidance.  A micropuncture needle was used.  This was upsized  to a micropuncture sheath.  Contrast injections were performed to  delineate the bypass graft.  Once the wire was advanced into the bypass  graft, a 5-French sheath was placed.  Upon attempting to gain access  into the bypass graft, it was noted that there was thrombus within the  profunda femoral as well as the bypass graft.  At this point, I elected  to not proceed with dilating the stenosis within the vein bypass graft,  but rather leave it knowing the patient run overnight with a TPA to  dissolve the thrombus.  A 20-cm Infusion length 5-French lysis catheter  was placed into the superficial femoral artery.  A 2 mg of TPA were  given as a pulse spray through the catheter.  The sheath was sewn in  placed.  The patient will be brought back to the angio suite tomorrow  for followup study.   IMPRESSION:  1. Thrombus present within  the profunda femoral and bypass graft on      the right.  2. Placement of catheter for thrombolysis.           ______________________________  V. Charlena Cross, MD  Electronically Signed     VWB/MEDQ  D:  11/12/2007  T:  11/13/2007  Job:  [id number]

## 2010-09-20 NOTE — Procedures (Signed)
BYPASS GRAFT EVALUATION   INDICATION:  Left lower extremity stent.   HISTORY:  Diabetes:  No  Cardiac:  No  Hypertension:  Yes  Smoking:  Previous  Previous Surgery:  Left popliteal artery PTA/stent on July 02, 2008,  history of right below knee amputation, single level arterial  examination.   SINGLE LEVEL ARTERIAL EXAM                               RIGHT              LEFT  Brachial:                    130                142  Anterior tibial:                                103  Posterior tibial:                               111  Peroneal:  Ankle/brachial index:        BKA                0.78   PREVIOUS ABI:  Date: Sep 28, 2008  RIGHT:  BKA  LEFT:  0.79   LOWER EXTREMITY BYPASS GRAFT DUPLEX EXAM:   DUPLEX:  Triphasic Doppler waveforms noted throughout the left common  femoral, superficial femoral, and popliteal arteries with no focal  increase in Doppler velocities.   IMPRESSION:  1. Patent left popliteal artery stent with no evidence of stenosis.  2. Stable left ankle brachial index noted.    ___________________________________________  V. Charlena Cross, MD   CH/MEDQ  D:  02/01/2009  T:  02/02/2009  Job:  [id number]

## 2010-09-21 ENCOUNTER — Ambulatory Visit (INDEPENDENT_AMBULATORY_CARE_PROVIDER_SITE_OTHER): Payer: Medicare Other | Admitting: Infectious Disease

## 2010-09-21 VITALS — BP 138/76 | HR 68 | Temp 97.7°F | Ht 73.0 in | Wt 170.2 lb

## 2010-09-21 DIAGNOSIS — IMO0001 Reserved for inherently not codable concepts without codable children: Secondary | ICD-10-CM

## 2010-09-21 DIAGNOSIS — L0292 Furuncle, unspecified: Secondary | ICD-10-CM

## 2010-09-21 DIAGNOSIS — Z22322 Carrier or suspected carrier of Methicillin resistant Staphylococcus aureus: Secondary | ICD-10-CM

## 2010-09-21 DIAGNOSIS — B20 Human immunodeficiency virus [HIV] disease: Secondary | ICD-10-CM

## 2010-09-21 DIAGNOSIS — E785 Hyperlipidemia, unspecified: Secondary | ICD-10-CM

## 2010-09-21 MED ORDER — MUPIROCIN 2 % EX OINT: TOPICAL_OINTMENT | Freq: Two times a day (BID) | CUTANEOUS | Status: AC

## 2010-09-21 MED ORDER — CHLORHEXIDINE GLUCONATE 4 % EX LIQD: Freq: Every day | CUTANEOUS | Status: AC

## 2010-09-21 NOTE — Assessment & Plan Note (Signed)
Continue his current anti-retroviral regimen

## 2010-09-21 NOTE — Assessment & Plan Note (Signed)
At goal with regard to his lipids and try some dietary modifications to improve his HDL

## 2010-09-21 NOTE — Progress Notes (Signed)
Subjective:    Patient ID: Nathan [name], male    DOB: [birth year], 60 y.o.   MRN: [id number]  HPI 60 year old with HIV (well controlled) DM, hyperlipidemia, HTN and PVD sp AKA on the right sp revision who returns to clinic for followup. His HIV remains completely suppressed and his CD4 count is 5/500. He's been working with Nathan [name] to try and improve his control of his diabetes mellitus. His A1c had increased from 6 in the fall to above 8. He says his blood sugars are still running in the 200 range despite having clear decrease his Lantus from 25 units to 50 units. When he was with Nathan [name] sometime in next week or 2 he had missed an appointment due to a loss of a family member due to sudden tragic death. Light lesion adjacent to this which is also began to drain it seems to be resolving. We reviewed various decontamination regimen including twice daily mupirocin once daily Hibiclens baths I prescribe these for him to begin after his lesions have resolved. I spent greater than 45 minutes with the patient including review of all of his laboratory data from the AIDS clinical trials group with greater than 50% of the time spent counseling the patient and coordinating his care.   Review of Systems  Constitutional: Negative for diaphoresis, activity change, appetite change and fatigue.  HENT: Negative for hearing loss, ear pain, neck pain, neck stiffness and ear discharge.   Eyes: Negative for photophobia, itching and visual disturbance.  Respiratory: Negative for apnea, cough, choking, chest tightness, shortness of breath and wheezing.   Cardiovascular: Negative for chest pain, palpitations and leg swelling.  Gastrointestinal: Negative for abdominal pain, constipation, blood in stool, abdominal distention and anal bleeding.  Genitourinary: Negative for dysuria and flank pain.  Musculoskeletal: Positive for gait problem. Negative for myalgias, back pain, joint swelling and arthralgias.  Skin: Positive  for rash. Negative for color change and pallor.  Neurological: Negative for dizziness, tremors, syncope, weakness and headaches.  Hematological: Negative for adenopathy. Does not bruise/bleed easily.  Psychiatric/Behavioral: Negative for behavioral problems, confusion, dysphoric mood, decreased concentration and agitation.       Objective:   Physical Exam  Constitutional: He is oriented to person, place, and time. He appears well-nourished. No distress.  HENT:  Head: Normocephalic and atraumatic.  Mouth/Throat: Oropharyngeal exudate present.  Eyes: Conjunctivae and EOM are normal. Pupils are equal, round, and reactive to light. No scleral icterus.  Neck: Normal range of motion. Neck supple.  Cardiovascular: Normal rate, regular rhythm and normal heart sounds.  Exam reveals no gallop and no friction rub.   No murmur heard. Pulmonary/Chest: Effort normal and breath sounds normal. No respiratory distress. He has no wheezes. He exhibits no tenderness.  Abdominal: He exhibits no distension. There is no tenderness. There is no rebound and no guarding.  Musculoskeletal: He exhibits no edema and no tenderness.  Lymphadenopathy:    He has no cervical adenopathy.  Neurological: He is alert and oriented to person, place, and time.  Skin: Skin is dry. He is not diaphoretic. No pallor.       He did have a furuncle in his inguinal area that seems to be diminishing in size but was still slightly raised. He had another satellite lesion adjacent to this that had drained it was still is with minimal erythema.  Psychiatric: He has a normal mood and affect. His behavior is normal. Judgment and thought content normal.  Assessment & Plan:  HIV DISEASE Continue his current anti-retroviral regimen  DIABETES MELLITUS, TYPE II, UNCONTROLLED He's closed up track of his  MRSA colonization Will try to decolonize him with topical Hibiclens and mupirocin  Furuncle He can continue to apply warm  compresses and complete his course of doxycycline we will do a decolonization regimen.  HYPERLIPIDEMIA At goal with regard to his lipids and try some dietary modifications to improve his HDL

## 2010-09-21 NOTE — Assessment & Plan Note (Signed)
He's closed up track of his

## 2010-09-21 NOTE — Assessment & Plan Note (Signed)
Will try to decolonize him with topical Hibiclens and mupirocin

## 2010-09-21 NOTE — Assessment & Plan Note (Signed)
He can continue to apply warm compresses and complete his course of doxycycline we will do a decolonization regimen.

## 2010-09-26 ENCOUNTER — Encounter: Payer: Self-pay | Admitting: Adult Health

## 2010-09-26 LAB — CD4/CD8 (T-HELPER/T-SUPPRESSOR CELL)
CD4: 552
CD8 % Suppressor T Cell: 62.9

## 2010-09-26 LAB — HIV-1 RNA QUANT-NO REFLEX-BLD: HIV-1 RNA Viral Load: <40

## 2010-10-26 ENCOUNTER — Other Ambulatory Visit (INDEPENDENT_AMBULATORY_CARE_PROVIDER_SITE_OTHER): Payer: Medicare Other | Admitting: Licensed Clinical Social Worker

## 2010-10-26 DIAGNOSIS — G629 Polyneuropathy, unspecified: Secondary | ICD-10-CM

## 2010-10-26 DIAGNOSIS — E119 Type 2 diabetes mellitus without complications: Secondary | ICD-10-CM

## 2010-10-26 DIAGNOSIS — I2510 Atherosclerotic heart disease of native coronary artery without angina pectoris: Secondary | ICD-10-CM

## 2010-10-26 DIAGNOSIS — E785 Hyperlipidemia, unspecified: Secondary | ICD-10-CM

## 2010-10-26 DIAGNOSIS — K219 Gastro-esophageal reflux disease without esophagitis: Secondary | ICD-10-CM

## 2010-10-26 MED ORDER — OMEPRAZOLE 40 MG PO CPDR: 40.0000 mg | DELAYED_RELEASE_CAPSULE | Freq: Every day | ORAL | Status: DC

## 2010-10-26 MED ORDER — GABAPENTIN 300 MG PO CAPS: 300.0000 mg | ORAL_CAPSULE | Freq: Every day | ORAL | Status: DC

## 2010-10-26 MED ORDER — ATORVASTATIN CALCIUM 40 MG PO TABS: 40.0000 mg | ORAL_TABLET | Freq: Every day | ORAL | Status: DC

## 2010-10-26 MED ORDER — CLOPIDOGREL BISULFATE 75 MG PO TABS
75.0000 mg | ORAL_TABLET | Freq: Every day | ORAL | Status: DC
Start: 2010-10-26 — End: 2011-02-21

## 2010-10-26 MED ORDER — GLUCOSE BLOOD VI STRP: ORAL_STRIP | Status: DC

## 2010-10-26 MED ORDER — LISINOPRIL 40 MG PO TABS: 40.0000 mg | ORAL_TABLET | Freq: Every day | ORAL | Status: DC

## 2010-11-02 ENCOUNTER — Telehealth: Payer: Self-pay | Admitting: Infectious Disease

## 2010-11-02 DIAGNOSIS — R64 Cachexia: Secondary | ICD-10-CM | POA: Insufficient documentation

## 2010-11-02 DIAGNOSIS — B20 Human immunodeficiency virus [HIV] disease: Secondary | ICD-10-CM

## 2010-11-02 MED ORDER — ENSURE PO LIQD: 237.0000 mL | Freq: Three times a day (TID) | ORAL | Status: DC

## 2010-11-02 NOTE — Telephone Encounter (Signed)
Pt needs ensure. script

## 2010-11-28 ENCOUNTER — Ambulatory Visit: Payer: Self-pay | Admitting: Surgery

## 2010-12-05 ENCOUNTER — Encounter (INDEPENDENT_AMBULATORY_CARE_PROVIDER_SITE_OTHER): Payer: Medicare Other

## 2010-12-05 ENCOUNTER — Ambulatory Visit (INDEPENDENT_AMBULATORY_CARE_PROVIDER_SITE_OTHER): Payer: Medicare Other | Admitting: Surgery

## 2010-12-05 DIAGNOSIS — I739 Peripheral vascular disease, unspecified: Secondary | ICD-10-CM

## 2010-12-05 DIAGNOSIS — Z48812 Encounter for surgical aftercare following surgery on the circulatory system: Secondary | ICD-10-CM

## 2010-12-05 DIAGNOSIS — I70219 Atherosclerosis of native arteries of extremities with intermittent claudication, unspecified extremity: Secondary | ICD-10-CM

## 2010-12-06 NOTE — Procedures (Unsigned)
LOWER EXTREMITY ARTERIAL DUPLEX  INDICATION:  Right AKA; left popliteal stent.  HISTORY: Diabetes:  Yes. Cardiac:  No. Hypertension:  Yes. Smoking:  Previous. Previous Surgery:  Left lower extremity stent, 07/02/08.  SINGLE LEVEL ARTERIAL EXAM                         RIGHT                LEFT Brachial:               107                  126 Anterior tibial:                             110 Posterior tibial:                            125 Peroneal: Ankle/Brachial Index:                        0.99  LOWER EXTREMITY ARTERIAL DUPLEX EXAM  PREVIOUS ABI:  Date:  11/29/09  Left = 0.93  DUPLEX: 1. Widely patent left above-knee popliteal stent with mild-to-moderate     diffuse disease proximal and distal to the stent. 2. No areas of focal stenosis are observed. 3. Incidentally, brachial blood pressure gradient is observed with     abnormal waveforms on the right.  IMPRESSION:  Widely patent left above-knee popliteal stent with diffuse disease in the native arteries, as described above.  ___________________________________________ V. Charlena Cross, MD  LT/MEDQ  D:  12/06/2010  T:  12/06/2010  Job:  [id number]

## 2010-12-06 NOTE — Assessment & Plan Note (Signed)
OFFICE VISIT  Nathan [name], Nathan [name] DOB:  03/02/1951                                       12/05/2010 ZOXWR#:[id number]  REASON FOR VISIT:  Followup.  HISTORY:  This is a 60 year old gentleman who is status post right above knee amputation as well as left popliteal stent placed 07/02/2008 for claudication.  The patient is doing very well at this time.  He has a prosthesis on his right leg and is ambulating.  He denies symptoms in the left leg.  PHYSICAL EXAMINATION:  Vital signs:  His heart rate is 76, blood pressure 116/62, O2 sat 99%.  General:  He is well-appearing, in no distress.  Respirations:  Nonlabored.  Cardiovascular:  Regular rhythm. No carotid bruits.  Extremities:  Warm, well-perfused.  Right leg prosthesis is in place.  DIAGNOSTIC STUDIES:  Duplex ultrasound was performed today which shows an ABI of 0.99 on the left with triphasic waveforms.  There is a widely patent left above knee popliteal stent with mild to moderate diffuse disease above and below the stent with no areas of focal stenosis.  ASSESSMENT AND PLAN:  Status post left leg popliteal stent.  The patient is doing very well at this time without evidence of stenosis.  He will be reimaged in 1 year.  At that time I am also going to study his carotid arteries as we have not done this in some time.  The patient will come back to see me in a year.    Nathan Ny, MD Electronically Signed  VWB/MEDQ  [name]:  12/05/2010  T:  12/06/2010  Job:  [id number]

## 2010-12-27 ENCOUNTER — Ambulatory Visit (INDEPENDENT_AMBULATORY_CARE_PROVIDER_SITE_OTHER): Payer: Medicare Other | Admitting: *Deleted

## 2010-12-27 VITALS — BP 133/73 | HR 69 | Temp 97.5°F | Wt 171.5 lb

## 2010-12-27 DIAGNOSIS — B20 Human immunodeficiency virus [HIV] disease: Secondary | ICD-10-CM

## 2010-12-27 DIAGNOSIS — E785 Hyperlipidemia, unspecified: Secondary | ICD-10-CM

## 2010-12-27 LAB — CBC WITH DIFFERENTIAL/PLATELET
Basophils Absolute: 0 10*3/uL (ref 0.0–0.1)
Basophils Relative: 1 % (ref 0–1)
Eosinophils Absolute: 0.2 10*3/uL (ref 0.0–0.7)
Eosinophils Relative: 4 % (ref 0–5)
HCT: 38.9 % — ABNORMAL LOW (ref 39.0–52.0)
Hemoglobin: 13.4 g/dL (ref 13.0–17.0)
Lymphocytes Relative: 46 % (ref 12–46)
Lymphs Abs: 2.8 10*3/uL (ref 0.7–4.0)
MCH: 39 pg — ABNORMAL HIGH (ref 26.0–34.0)
MCHC: 34.4 g/dL (ref 30.0–36.0)
MCV: 113.1 fL — ABNORMAL HIGH (ref 78.0–100.0)
Monocytes Absolute: 0.4 10*3/uL (ref 0.1–1.0)
Monocytes Relative: 7 % (ref 3–12)
Neutro Abs: 2.6 10*3/uL (ref 1.7–7.7)
Neutrophils Relative %: 43 % (ref 43–77)
Platelets: 215 10*3/uL (ref 150–400)
RBC: 3.44 MIL/uL — ABNORMAL LOW (ref 4.22–5.81)
RDW: 13.2 % (ref 11.5–15.5)
WBC: 6.1 10*3/uL (ref 4.0–10.5)

## 2010-12-27 LAB — COMPREHENSIVE METABOLIC PANEL
BUN: 8 mg/dL (ref 6–23)
CO2: 22 mEq/L (ref 19–32)
Calcium: 9.6 mg/dL (ref 8.4–10.5)
Chloride: 101 mEq/L (ref 96–112)
Creat: 0.85 mg/dL (ref 0.50–1.35)
Total Bilirubin: 0.7 mg/dL (ref 0.3–1.2)

## 2010-12-27 LAB — LIPID PANEL
Cholesterol: 133 mg/dL (ref 0–200)
HDL: 28 mg/dL — ABNORMAL LOW (ref >39)
Triglycerides: 210 mg/dL — ABNORMAL HIGH (ref <150)
VLDL: 42 mg/dL — ABNORMAL HIGH (ref 0–40)

## 2010-12-27 NOTE — Progress Notes (Signed)
12/27/2010 @ 10:00: Pt here for research study A5001, week 704. Pt denies any new findings and that the pain in his right stump has stopped as of April 2012. He has stopped taking his Oxycodone as of April 2012 as well. Vital signs stable. Non-fasting labs were drawn. Pt received $20 gift card for study. Will call patient for next study visit in November 2012. -- Tacey Heap RN

## 2011-01-17 ENCOUNTER — Other Ambulatory Visit: Payer: Self-pay | Admitting: *Deleted

## 2011-01-17 DIAGNOSIS — B20 Human immunodeficiency virus [HIV] disease: Secondary | ICD-10-CM

## 2011-01-17 MED ORDER — LAMIVUDINE-ZIDOVUDINE 150-300 MG PO TABS: 1.0000 | ORAL_TABLET | Freq: Two times a day (BID) | ORAL | Status: DC

## 2011-01-17 MED ORDER — TENOFOVIR DISOPROXIL FUMARATE 300 MG PO TABS: 300.0000 mg | ORAL_TABLET | Freq: Every day | ORAL | Status: DC

## 2011-01-17 MED ORDER — RALTEGRAVIR POTASSIUM 400 MG PO TABS: 400.0000 mg | ORAL_TABLET | Freq: Two times a day (BID) | ORAL | Status: DC

## 2011-01-19 ENCOUNTER — Other Ambulatory Visit: Payer: Self-pay | Admitting: Infectious Disease

## 2011-01-19 DIAGNOSIS — K219 Gastro-esophageal reflux disease without esophagitis: Secondary | ICD-10-CM

## 2011-02-02 LAB — CBC
HCT: 29 — ABNORMAL LOW
HCT: 29.1 — ABNORMAL LOW
HCT: 30.1 — ABNORMAL LOW
HCT: 31.7 — ABNORMAL LOW
HCT: 32.4 — ABNORMAL LOW
HCT: 32.5 — ABNORMAL LOW
HCT: 34.3 — ABNORMAL LOW
Hemoglobin: 10.2 — ABNORMAL LOW
Hemoglobin: 11.1 — ABNORMAL LOW
Hemoglobin: 11.2 — ABNORMAL LOW
Hemoglobin: 12.5 — ABNORMAL LOW
Hemoglobin: 12.7 — ABNORMAL LOW
Hemoglobin: 13.2
MCHC: 33.8
MCHC: 34
MCHC: 34
MCHC: 34.6
MCHC: 34.8
MCHC: 35.4
MCV: 116.7 — ABNORMAL HIGH
MCV: 116.8 — ABNORMAL HIGH
MCV: 117 — ABNORMAL HIGH
MCV: 118 — ABNORMAL HIGH
MCV: 118.2 — ABNORMAL HIGH
MCV: 118.3 — ABNORMAL HIGH
MCV: 118.5 — ABNORMAL HIGH
Platelets: 141 — ABNORMAL LOW
Platelets: 147 — ABNORMAL LOW
Platelets: 163
Platelets: 168
Platelets: 169
RBC: 2.79 — ABNORMAL LOW
RBC: 2.91 — ABNORMAL LOW
RBC: 3.05 — ABNORMAL LOW
RBC: 3.08 — ABNORMAL LOW
RBC: 3.1 — ABNORMAL LOW
RDW: 13.6
RDW: 13.7
RDW: 13.7
RDW: 13.9
RDW: 14.1
RDW: 14.1
RDW: 14.2
RDW: 14.2
WBC: 7.1
WBC: 7.1
WBC: 7.3
WBC: 7.7
WBC: 8.1
WBC: 8.8

## 2011-02-02 LAB — BASIC METABOLIC PANEL
BUN: 10
BUN: 14
BUN: 16
BUN: 5 — ABNORMAL LOW
BUN: 9
CO2: 23
Calcium: 8.8
Chloride: 102
Chloride: 103
Chloride: 108
GFR calc Af Amer: >60
GFR calc non Af Amer: >60
GFR calc non Af Amer: >60
Glucose, Bld: 131 — ABNORMAL HIGH
Glucose, Bld: 133 — ABNORMAL HIGH
Glucose, Bld: 155 — ABNORMAL HIGH
Glucose, Bld: 155 — ABNORMAL HIGH
Potassium: 3.8
Potassium: 3.9
Potassium: 4
Potassium: 4.1
Potassium: 4.1
Sodium: 136

## 2011-02-02 LAB — POCT I-STAT, CHEM 8
Calcium, Ion: 1.17
Calcium, Ion: 1.23
Chloride: 103
Glucose, Bld: 105 — ABNORMAL HIGH
Glucose, Bld: 139 — ABNORMAL HIGH
HCT: 30 — ABNORMAL LOW
HCT: 42
Hemoglobin: 10.2 — ABNORMAL LOW
Hemoglobin: 14.3
Potassium: 3.6

## 2011-02-02 LAB — HEPARIN LEVEL (UNFRACTIONATED)
Heparin Unfractionated: 0.12 — ABNORMAL LOW
Heparin Unfractionated: 0.16 — ABNORMAL LOW
Heparin Unfractionated: 0.16 — ABNORMAL LOW
Heparin Unfractionated: 0.22 — ABNORMAL LOW
Heparin Unfractionated: 0.34
Heparin Unfractionated: <0.1 — ABNORMAL LOW
Heparin Unfractionated: <0.1 — ABNORMAL LOW
Heparin Unfractionated: <0.1 — ABNORMAL LOW

## 2011-02-02 LAB — PROTIME-INR
INR: 1
INR: 1.1

## 2011-02-02 LAB — TYPE AND SCREEN: ABO/RH(D): AB POS

## 2011-02-03 LAB — BASIC METABOLIC PANEL
BUN: 9
CO2: 27
CO2: 28
Calcium: 9.1
Calcium: 9.3
Calcium: 9.5
Creatinine, Ser: 0.72
Creatinine, Ser: 0.88
GFR calc Af Amer: >60
GFR calc Af Amer: >60
GFR calc non Af Amer: >60
GFR calc non Af Amer: >60
GFR calc non Af Amer: >60
Glucose, Bld: 107 — ABNORMAL HIGH
Glucose, Bld: 113 — ABNORMAL HIGH
Potassium: 3.9
Sodium: 128 — ABNORMAL LOW
Sodium: 130 — ABNORMAL LOW
Sodium: 131 — ABNORMAL LOW

## 2011-02-03 LAB — CBC
HCT: 26.7 — ABNORMAL LOW
HCT: 30.5 — ABNORMAL LOW
Hemoglobin: 10.8 — ABNORMAL LOW
Hemoglobin: 9.2 — ABNORMAL LOW
Hemoglobin: 9.9 — ABNORMAL LOW
MCHC: 34.4
MCV: 112.8 — ABNORMAL HIGH
Platelets: 241
Platelets: 308
Platelets: 334
RBC: 2.46 — ABNORMAL LOW
RBC: 2.74 — ABNORMAL LOW
RBC: 3.1 — ABNORMAL LOW
RDW: 13.6
RDW: 15.8 — ABNORMAL HIGH
RDW: 16 — ABNORMAL HIGH
WBC: 11.6 — ABNORMAL HIGH
WBC: 6.7

## 2011-02-03 LAB — PROTIME-INR
INR: 1
INR: 1
INR: 1.1
INR: 1.1
INR: 2.1 — ABNORMAL HIGH
Prothrombin Time: 13.5
Prothrombin Time: 13.8
Prothrombin Time: 14.2
Prothrombin Time: 25 — ABNORMAL HIGH

## 2011-02-03 LAB — RENAL FUNCTION PANEL
Albumin: 3.2 — ABNORMAL LOW
BUN: 7
Phosphorus: 4
Potassium: 3.8
Sodium: 132 — ABNORMAL LOW

## 2011-02-10 LAB — T-HELPER CELL (CD4) - (RCID CLINIC ONLY)
CD4 % Helper T Cell: 19 % — ABNORMAL LOW (ref 33–55)
CD4 T Cell Abs: 790 uL (ref 400–2700)

## 2011-02-14 ENCOUNTER — Other Ambulatory Visit: Payer: Self-pay | Admitting: *Deleted

## 2011-02-14 DIAGNOSIS — E119 Type 2 diabetes mellitus without complications: Secondary | ICD-10-CM

## 2011-02-14 MED ORDER — LANCETS MISC: 30.0000 [IU] | Freq: Three times a day (TID) | Status: DC

## 2011-02-14 MED ORDER — INSULIN PEN NEEDLE 31G X 8 MM MISC: 30.0000 [IU] | Freq: Every day | Status: DC

## 2011-02-15 LAB — URINALYSIS, ROUTINE W REFLEX MICROSCOPIC
Bilirubin Urine: NEGATIVE
Hgb urine dipstick: NEGATIVE
Ketones, ur: NEGATIVE
Specific Gravity, Urine: 1.019
Urobilinogen, UA: 0.2
pH: 5.5

## 2011-02-15 LAB — COMPREHENSIVE METABOLIC PANEL
ALT: 17
AST: 21
Calcium: 9.5
Creatinine, Ser: 0.76
GFR calc Af Amer: >60
Glucose, Bld: 103 — ABNORMAL HIGH
Sodium: 136
Total Protein: 7.5

## 2011-02-15 LAB — BASIC METABOLIC PANEL
CO2: 24
Glucose, Bld: 212 — ABNORMAL HIGH
Potassium: 4
Sodium: 137

## 2011-02-15 LAB — TYPE AND SCREEN
ABO/RH(D): AB POS
Antibody Screen: NEGATIVE

## 2011-02-15 LAB — CBC
HCT: 33.1 — ABNORMAL LOW
Hemoglobin: 11.2 — ABNORMAL LOW
MCHC: 33.8
MCHC: 34
RDW: 13.1
RDW: 13.3

## 2011-02-15 LAB — PROTIME-INR
INR: 0.9
Prothrombin Time: 12.5

## 2011-02-16 LAB — COMPREHENSIVE METABOLIC PANEL
ALT: 20
AST: 19
Calcium: 10
GFR calc Af Amer: >60
Sodium: 138
Total Protein: 6.9

## 2011-02-16 LAB — CBC
MCHC: 34.3
Platelets: 261
RDW: 13.6

## 2011-02-16 LAB — PROTIME-INR: Prothrombin Time: 12.4

## 2011-02-17 ENCOUNTER — Other Ambulatory Visit: Payer: Self-pay | Admitting: Licensed Clinical Social Worker

## 2011-02-17 DIAGNOSIS — E119 Type 2 diabetes mellitus without complications: Secondary | ICD-10-CM

## 2011-02-17 MED ORDER — INSULIN GLARGINE 100 UNIT/ML ~~LOC~~ SOLN: 36.0000 [IU] | Freq: Every day | SUBCUTANEOUS | Status: DC

## 2011-02-21 ENCOUNTER — Other Ambulatory Visit: Payer: Self-pay | Admitting: Infectious Disease

## 2011-02-21 DIAGNOSIS — E7800 Pure hypercholesterolemia, unspecified: Secondary | ICD-10-CM

## 2011-02-21 DIAGNOSIS — I10 Essential (primary) hypertension: Secondary | ICD-10-CM

## 2011-02-21 DIAGNOSIS — I739 Peripheral vascular disease, unspecified: Secondary | ICD-10-CM

## 2011-05-17 ENCOUNTER — Other Ambulatory Visit: Payer: Self-pay | Admitting: *Deleted

## 2011-05-17 DIAGNOSIS — E119 Type 2 diabetes mellitus without complications: Secondary | ICD-10-CM

## 2011-05-17 MED ORDER — LANCETS MISC: 30.0000 [IU] | Freq: Three times a day (TID) | Status: DC

## 2011-05-17 MED ORDER — INSULIN GLARGINE 100 UNIT/ML ~~LOC~~ SOLN: 36.0000 [IU] | Freq: Every day | SUBCUTANEOUS | Status: DC

## 2011-05-17 MED ORDER — INSULIN PEN NEEDLE 31G X 8 MM MISC: 30.0000 [IU] | Freq: Every day | Status: DC

## 2011-05-18 ENCOUNTER — Ambulatory Visit (INDEPENDENT_AMBULATORY_CARE_PROVIDER_SITE_OTHER): Payer: Medicare Other | Admitting: *Deleted

## 2011-05-18 VITALS — BP 120/76 | HR 62 | Temp 97.5°F | Wt 165.5 lb

## 2011-05-18 DIAGNOSIS — B20 Human immunodeficiency virus [HIV] disease: Secondary | ICD-10-CM

## 2011-05-18 DIAGNOSIS — E785 Hyperlipidemia, unspecified: Secondary | ICD-10-CM

## 2011-05-18 LAB — COMPREHENSIVE METABOLIC PANEL
ALT: 56 U/L — ABNORMAL HIGH (ref 0–53)
Albumin: 5.4 g/dL — ABNORMAL HIGH (ref 3.5–5.2)
Alkaline Phosphatase: 68 U/L (ref 39–117)
Potassium: 5.1 mEq/L (ref 3.5–5.3)
Sodium: 136 mEq/L (ref 135–145)
Total Bilirubin: 0.8 mg/dL (ref 0.3–1.2)
Total Protein: 7.7 g/dL (ref 6.0–8.3)

## 2011-05-18 LAB — CD4/CD8 (T-HELPER/T-SUPPRESSOR CELL)
CD8 % Suppressor T Cell: 65.1
CD8: 2279

## 2011-05-18 LAB — LIPID PANEL
HDL: 33 mg/dL — ABNORMAL LOW (ref >39)
LDL Cholesterol: 90 mg/dL (ref 0–99)
Total CHOL/HDL Ratio: 4.6 Ratio

## 2011-05-18 LAB — HIV-1 RNA QUANT-NO REFLEX-BLD: HIV-1 RNA Viral Load: <40

## 2011-05-18 NOTE — Progress Notes (Signed)
Patient here for his week 720 ALLRT study visit. He denies any new problems or concerns. He continues to have some numbness in the left foot/leg. He will return in February for the next visit.

## 2011-06-02 ENCOUNTER — Encounter: Payer: Self-pay | Admitting: Infectious Disease

## 2011-06-08 ENCOUNTER — Ambulatory Visit (INDEPENDENT_AMBULATORY_CARE_PROVIDER_SITE_OTHER): Payer: Medicare Other | Admitting: Infectious Disease

## 2011-06-08 ENCOUNTER — Encounter: Payer: Self-pay | Admitting: Infectious Disease

## 2011-06-08 VITALS — BP 156/77 | HR 67 | Temp 97.4°F | Wt 164.0 lb

## 2011-06-08 DIAGNOSIS — B20 Human immunodeficiency virus [HIV] disease: Secondary | ICD-10-CM | POA: Diagnosis not present

## 2011-06-08 DIAGNOSIS — R64 Cachexia: Secondary | ICD-10-CM | POA: Diagnosis not present

## 2011-06-08 DIAGNOSIS — IMO0002 Reserved for concepts with insufficient information to code with codable children: Secondary | ICD-10-CM | POA: Diagnosis not present

## 2011-06-08 DIAGNOSIS — E785 Hyperlipidemia, unspecified: Secondary | ICD-10-CM | POA: Diagnosis not present

## 2011-06-08 DIAGNOSIS — S78119A Complete traumatic amputation at level between unspecified hip and knee, initial encounter: Secondary | ICD-10-CM | POA: Diagnosis not present

## 2011-06-08 DIAGNOSIS — E119 Type 2 diabetes mellitus without complications: Secondary | ICD-10-CM | POA: Diagnosis not present

## 2011-06-08 LAB — HEMOGLOBIN A1C
Hgb A1c MFr Bld: 8.4 % — ABNORMAL HIGH (ref <5.7)
Mean Plasma Glucose: 194 mg/dL — ABNORMAL HIGH (ref <117)

## 2011-06-08 MED ORDER — FREESTYLE SYSTEM KIT: 1.0000 | PACK | Status: DC | PRN

## 2011-06-08 NOTE — Assessment & Plan Note (Signed)
Superb control. 

## 2011-06-08 NOTE — Assessment & Plan Note (Signed)
Last a1c was 8. Recheck and have him see Lupita Leash again. May want him followed by [facility] in general for non ID issues

## 2011-06-08 NOTE — Progress Notes (Signed)
  Subjective:    Patient ID: Nathan [name], male    DOB: 02/01/1951, 61 y.o.   MRN: [id number]  HPI  [name] is a 61 y.o. male who is doing superbly well on HIS  antiviral regimen, with undetectable viral load and health cd4 count.  He has not seen Lupita Leash in quite some time regarding his DM and he has lost his glucometer (new one ordered today). He is having trouble with his prosthetic leg and needed rx for fit for new one. Otherwise doing very well and withotu complaints  Review of Systems  Constitutional: Negative for fever, chills, diaphoresis, activity change, appetite change, fatigue and unexpected weight change.  HENT: Negative for congestion, sore throat, rhinorrhea, sneezing, trouble swallowing and sinus pressure.   Eyes: Negative for photophobia and visual disturbance.  Respiratory: Negative for cough, chest tightness, shortness of breath, wheezing and stridor.   Cardiovascular: Negative for chest pain, palpitations and leg swelling.  Gastrointestinal: Negative for nausea, vomiting, abdominal pain, diarrhea, constipation, blood in stool, abdominal distention and anal bleeding.  Genitourinary: Negative for dysuria, hematuria, flank pain and difficulty urinating.  Musculoskeletal: Positive for gait problem. Negative for myalgias, back pain, joint swelling and arthralgias.  Skin: Negative for color change, pallor, rash and wound.  Neurological: Negative for dizziness, tremors, weakness and light-headedness.  Hematological: Negative for adenopathy. Does not bruise/bleed easily.  Psychiatric/Behavioral: Negative for behavioral problems, confusion, sleep disturbance, dysphoric mood, decreased concentration and agitation.       Objective:   Physical Exam  Constitutional: He is oriented to person, place, and time. He appears well-developed and well-nourished. No distress.  HENT:  Head: Normocephalic and atraumatic.  Mouth/Throat: Oropharynx is clear and moist. No oropharyngeal  exudate.  Eyes: Conjunctivae and EOM are normal. Pupils are equal, round, and reactive to light. No scleral icterus.  Neck: Normal range of motion. Neck supple. No JVD present.  Cardiovascular: Normal rate, regular rhythm and normal heart sounds.  Exam reveals no gallop and no friction rub.   No murmur heard. Pulmonary/Chest: Effort normal and breath sounds normal. No respiratory distress. He has no wheezes. He has no rales. He exhibits no tenderness.  Abdominal: He exhibits no distension and no mass. There is no tenderness. There is no rebound and no guarding.  Musculoskeletal: He exhibits no edema and no tenderness.       Legs: Lymphadenopathy:    He has no cervical adenopathy.  Neurological: He is alert and oriented to person, place, and time. He has normal reflexes. He exhibits normal muscle tone. Coordination normal.  Skin: Skin is warm and dry. He is not diaphoretic. No erythema. No pallor.  Psychiatric: He has a normal mood and affect. His behavior is normal. Judgment and thought content normal.          Assessment & Plan:  AKA, RIGHT, HX OF Needs better fitting prosthesis, rx written on hand written script  AIDS with cachexia Superb control!  DIABETES MELLITUS, TYPE II, CONTROLLED Last a1c was 8. Recheck and have him see Lupita Leash again. May want him followed by [facility] in general for non ID issues  HYPERLIPIDEMIA At goal  HYPERTENSION NEC BP up today, but appears to be bit of white coat htn, was fine two weeks ago

## 2011-06-08 NOTE — Assessment & Plan Note (Signed)
BP up today, but appears to be bit of white coat htn, was fine two weeks ago

## 2011-06-08 NOTE — Assessment & Plan Note (Signed)
At goal.  

## 2011-06-08 NOTE — Assessment & Plan Note (Signed)
Needs better fitting prosthesis, rx written on hand written script

## 2011-06-13 ENCOUNTER — Telehealth: Payer: Self-pay | Admitting: Licensed Clinical Social Worker

## 2011-06-13 DIAGNOSIS — E119 Type 2 diabetes mellitus without complications: Secondary | ICD-10-CM

## 2011-06-13 MED ORDER — GLUCERNA PO LIQD: 237.0000 mL | Freq: Three times a day (TID) | ORAL | Status: DC

## 2011-06-13 NOTE — Telephone Encounter (Signed)
Patient states that he made an appointment with [facility] but they would like him to have a prescription for PT written out for him to bring to the appointment. His appointment is 06/27/2011, I will forward this to Dr. Daiva Eves.

## 2011-06-14 NOTE — Telephone Encounter (Signed)
Script written and is in my red folder

## 2011-06-14 NOTE — Telephone Encounter (Signed)
Thank you. Patient aware.

## 2011-06-16 ENCOUNTER — Other Ambulatory Visit: Payer: Self-pay | Admitting: Infectious Disease

## 2011-06-16 DIAGNOSIS — I10 Essential (primary) hypertension: Secondary | ICD-10-CM

## 2011-06-16 DIAGNOSIS — E7800 Pure hypercholesterolemia, unspecified: Secondary | ICD-10-CM

## 2011-06-16 DIAGNOSIS — I739 Peripheral vascular disease, unspecified: Secondary | ICD-10-CM

## 2011-06-27 ENCOUNTER — Ambulatory Visit: Payer: Medicare Other | Attending: Infectious Disease | Admitting: Physical Therapy

## 2011-06-27 DIAGNOSIS — IMO0001 Reserved for inherently not codable concepts without codable children: Secondary | ICD-10-CM | POA: Insufficient documentation

## 2011-06-27 DIAGNOSIS — S78119A Complete traumatic amputation at level between unspecified hip and knee, initial encounter: Secondary | ICD-10-CM | POA: Insufficient documentation

## 2011-06-27 DIAGNOSIS — R269 Unspecified abnormalities of gait and mobility: Secondary | ICD-10-CM | POA: Diagnosis not present

## 2011-07-05 ENCOUNTER — Ambulatory Visit (INDEPENDENT_AMBULATORY_CARE_PROVIDER_SITE_OTHER): Payer: Medicare Other | Admitting: *Deleted

## 2011-07-05 DIAGNOSIS — E785 Hyperlipidemia, unspecified: Secondary | ICD-10-CM

## 2011-07-05 DIAGNOSIS — B20 Human immunodeficiency virus [HIV] disease: Secondary | ICD-10-CM

## 2011-07-05 LAB — COMPREHENSIVE METABOLIC PANEL
ALT: 46 U/L (ref 0–53)
AST: 27 U/L (ref 0–37)
Alkaline Phosphatase: 64 U/L (ref 39–117)
Glucose, Bld: 244 mg/dL — ABNORMAL HIGH (ref 70–99)
Sodium: 141 mEq/L (ref 135–145)
Total Bilirubin: 0.7 mg/dL (ref 0.3–1.2)
Total Protein: 6.9 g/dL (ref 6.0–8.3)

## 2011-07-05 LAB — LIPID PANEL
LDL Cholesterol: 68 mg/dL (ref 0–99)
Triglycerides: 170 mg/dL — ABNORMAL HIGH (ref <150)
VLDL: 34 mg/dL (ref 0–40)

## 2011-07-05 LAB — CD4/CD8 (T-HELPER/T-SUPPRESSOR CELL)
CD4%: 21.2
CD4: 657
CD8: 2003

## 2011-07-05 NOTE — Progress Notes (Signed)
Patient here for week 736 ALLRT study appt. He denies any new problems and says he feels great. His blood sugar has been trending up and he is supposed to be scheduled a visit with Jamison Neighbor soon. He also is getting reevaluated for a new prosthesis by PT. His next visit will be in May and it will be his last ALLRT study visit. At this time he is not eligible for enrollment in one of their other studies.

## 2011-07-11 ENCOUNTER — Other Ambulatory Visit: Payer: Self-pay | Admitting: Dietician

## 2011-07-11 ENCOUNTER — Ambulatory Visit (INDEPENDENT_AMBULATORY_CARE_PROVIDER_SITE_OTHER): Payer: Medicare Other | Admitting: Dietician

## 2011-07-11 DIAGNOSIS — E119 Type 2 diabetes mellitus without complications: Secondary | ICD-10-CM

## 2011-07-11 MED ORDER — BAYER CONTOUR MONITOR W/DEVICE KIT: PACK | Status: DC

## 2011-07-11 MED ORDER — "PEN NEEDLES 3/16"" 31G X 5 MM MISC": 1.0000 "application " | Freq: Two times a day (BID) | Status: DC

## 2011-07-11 MED ORDER — GLUCOSE BLOOD VI STRP: ORAL_STRIP | Status: DC

## 2011-07-11 NOTE — Progress Notes (Signed)
Blood sugar today after coffee with creamer was 309.   Medical Nutrition Therapy: Annual follow up:  Appt start time: 1005 end time:  1120.  Assessment:  Primary concerns today: Blood sugar control and Annual Review Usual eating pattern includes Meal 2 and 1+ snacks per day.   Diinks coffee with powdered creamer in am, fist nmeal is ~ noon- well balanced meal, with dessert and 1 bread (~45-60 grams carb), then snacks on fruit, cheese and crackers, vegetables or leftover about every 2 hours until bed 9:30- 10 PM.  Reports blood sugars for past 2-3 months: fasting 239 today and most days around 250, later in day: 400 or more.  Avoided foods include: cookies and candy, sweetened cereal and ice cream and desserts Usual physical activity includes ADLs, gardening  Progress Towards Goal(s):  In progress   Nutritional Diagnosis:  NB-1.4 Self-monitoring deficit As related to not brinigin meter to today's visit self monitoring his blood sugars.  As evidenced by his report and not bringing meter.  [place]-2.2 Altered nutrition-related laboratory As related to his diabetes.  As evidenced by his higher A1C.  NI-5.6.3 Inappropriate intake of fats (specify): transfats As related to history of vascular disease and metabolic syndrome  as evidenced by patient report of using powdered creamer in coffee.  Interventions: 1- Education about insulin types and action: patient to start 5 units meal time insulin each day before noon meal. Check blood sugars before and 2 hours after that meal and a few times fasting and at bedtime as well.  2- Education: reminded patient about carb insulin relationship and hypoglycemia prevention, signs and symptoms and treatment.   3- Coordination of care: request to PCP Rxs for new insulin, pen needles, new meter strips sent to [facility].  4- Education about health effects of transfats and healthier options   Monitoring/Evaluation:  Blood sugars, food intake in 2  -3 weeks.

## 2011-07-11 NOTE — Progress Notes (Signed)
Addended by: Baird Cancer on: 07/11/2011 02:21 PM   Modules accepted: Orders

## 2011-07-11 NOTE — Telephone Encounter (Signed)
Patient request rx be sent to [facility] for :  1- Bayer Contour strips to check blood sugar up to 4 times daily 2- Novolog Flexpen- inject 5 units daily before noon meal 3- Pen needles- size 31gx 5mm- use to inject insulin twice daily  Recommend removing from med list:  pen needles 31gx 8 mm  Please note patient has been using 50 units lantus daily. Medication list updated

## 2011-07-11 NOTE — Progress Notes (Signed)
This patient was discussed with attending physician , Dr. Rogelia Boga and sample given with her permission.

## 2011-07-11 NOTE — Patient Instructions (Signed)
Let's follow up in 2 weeks.  Please begin injecting 5 units Novolog ( mealtime) insulin before your noon meal each day.  Check blood sugar before and 2 hours after that meal. Also continue with before breakfast and bedtime checks at least 7 day in the next 2 weeks.

## 2011-07-26 MED ORDER — INSULIN ASPART 100 UNIT/ML ~~LOC~~ SOLN: SUBCUTANEOUS | Status: DC

## 2011-07-26 NOTE — Progress Notes (Signed)
Addended by: Blanch Media A on: 07/26/2011 07:42 PM   Modules accepted: Orders

## 2011-08-01 ENCOUNTER — Other Ambulatory Visit: Payer: Self-pay | Admitting: Infectious Disease

## 2011-08-01 ENCOUNTER — Ambulatory Visit (INDEPENDENT_AMBULATORY_CARE_PROVIDER_SITE_OTHER): Payer: Medicare Other | Admitting: Dietician

## 2011-08-01 DIAGNOSIS — E119 Type 2 diabetes mellitus without complications: Secondary | ICD-10-CM

## 2011-08-01 MED ORDER — INSULIN ASPART 100 UNIT/ML ~~LOC~~ SOLN: SUBCUTANEOUS | Status: DC

## 2011-08-01 NOTE — Patient Instructions (Addendum)
Increase noon day insulin to 10 units Novolog  Begin injecting 5 units Novolog before evening meal which should be about 5 hours from your noon meal ( 5-7 PM.   [facility] will [facility] and re-request insulin and strip prescriptions.   Eat more monounsaturated fat like canola oil, olive oil, nuts,seeds, fish, avacado

## 2011-08-01 NOTE — Progress Notes (Signed)
Blood sugar today after coffee with creamer was 193.   Medical Nutrition Therapy: Annual follow up 2:  Appt start time: 955 end time:  1055.  Assessment:  Primary concerns today: Blood sugar control and Annual Review Usual eating pattern includes Meal 2 and 1+ snacks per day.   Diinks coffee with whole milk in am,  ~ noon- well balanced meal, with dessert and 1 bread (~ 60 grams carb), then snacks on fruit, cheese and crackers, vegetables or leftover about every 2 hours until bed 9:30- 10 PM. Estimated total daily dose of insulin accounting for insulin resistance is ~ 70-90 units/day Blood sugars for past 2-3 weeks: ~ 250 most times of day later in day: 400 or more.  Avoided foods include: cookies and candy, sweetened cereal and ice cream and desserts Usual physical activity includes ADLs, gardening  Progress Towards Goal(s):  In progress   Nutritional Diagnosis:  NB-1.4 Self-monitoring deficit As related to not brinigin meter to visit resolved  As evidenced by his bringing meter today  [place]-2.2 Altered nutrition-related laboratory As related to his diabetes.  As evidenced by his higher A1C.  NI-5.6.3 Inappropriate intake of fats (specify): transfats, saturated and polyunsaturated fats as related to cardiovascular disease risk and metabolic syndrome  as evidenced by patient report.  Interventions: 1- Education about insulin needs and insulin resistance: patient to increase to 10 units Novolog insulin before noon meal and 5 units before PM meal . Check blood sugars before and 2 hours after that meal and a few times fasting and at bedtime as well.  2- Education:  about types of fats and effects on lipids.  3- Coordination of care: request to PCP Rxs for Novolog Flexpen, diabetic shoes and metformin sent to [facility] (Diabetic shoes rx sent to patient for [employer].)  4- education about importacne of balanced meals 2-3 meals/day - patient to begin trying to eat better blanced  Pm meal   Monitoring/Evaluation:  Blood sugars, food intake in 6 weeks.

## 2011-08-03 ENCOUNTER — Encounter: Payer: Self-pay | Admitting: Infectious Disease

## 2011-08-08 ENCOUNTER — Other Ambulatory Visit: Payer: Self-pay | Admitting: *Deleted

## 2011-08-08 NOTE — Telephone Encounter (Signed)
meds ordered by ID clinic

## 2011-08-18 ENCOUNTER — Other Ambulatory Visit: Payer: Self-pay | Admitting: Licensed Clinical Social Worker

## 2011-08-18 ENCOUNTER — Other Ambulatory Visit: Payer: Self-pay | Admitting: *Deleted

## 2011-08-18 DIAGNOSIS — B20 Human immunodeficiency virus [HIV] disease: Secondary | ICD-10-CM

## 2011-08-18 MED ORDER — LAMIVUDINE-ZIDOVUDINE 150-300 MG PO TABS: 1.0000 | ORAL_TABLET | Freq: Two times a day (BID) | ORAL | Status: DC

## 2011-08-18 MED ORDER — TENOFOVIR DISOPROXIL FUMARATE 300 MG PO TABS: 300.0000 mg | ORAL_TABLET | Freq: Every day | ORAL | Status: DC

## 2011-08-18 MED ORDER — RALTEGRAVIR POTASSIUM 400 MG PO TABS: 400.0000 mg | ORAL_TABLET | Freq: Two times a day (BID) | ORAL | Status: DC

## 2011-09-06 ENCOUNTER — Other Ambulatory Visit: Payer: Self-pay | Admitting: Infectious Disease

## 2011-09-07 ENCOUNTER — Ambulatory Visit (INDEPENDENT_AMBULATORY_CARE_PROVIDER_SITE_OTHER): Payer: Medicare Other | Admitting: Dietician

## 2011-09-07 VITALS — Ht 73.0 in | Wt 171.8 lb

## 2011-09-07 DIAGNOSIS — E119 Type 2 diabetes mellitus without complications: Secondary | ICD-10-CM

## 2011-09-07 NOTE — Patient Instructions (Addendum)
Increase Novolog to    3 units with breakfast 15 units with lunch 10 units with dinner  Please record blood sugars     on sheet provided and mail back in/ or if you et a doctor appointment in next 2 weeks you can bring it with you.

## 2011-09-07 NOTE — Progress Notes (Signed)
Blood sugar today after coffee with creamer was 291/318.   Medical Nutrition Therapy: Annual follow up 3:  Appt start time: 935 end time:  1015.  Assessment:  Primary concerns today: Blood sugar control and foot care Usual eating pattern unchanged. Weight stable. No signs or symptoms of high or low blood sugar. Brought meter, but time and date off and ran out of strips. Estimated total daily dose of insulin accounting for insulin resistance is ~ 70-94 units/day.Is currently taking 65 units/day. Blood sugars for past 2 weeks: ~average of 298. 212-476 fasting, 237-422 before and after lunch and 300s at bedtime. Brought papers for foot exam. Agrees to see Internal Medicine physician today Usual physical activity includes ADLs, gardening  Progress Towards Goal(s):  In progress   Nutritional Diagnosis:   [place]-2.2 Altered nutrition-related laboratory As related to his diabetes and profound insulin resisance as evidenced by high high blood sugars A1C.  NI-5.6.3 Inappropriate intake of fats (specify): transfats, saturated and polyunsaturated fats as related to cardiovascular disease risk and metabolic syndrome  as evidenced by patient report.  Interventions: 1- Education about insulin needs and insulin resistance: patient desires to increase Mealtime insulin by 20%. cover coffee with 3 units Novolog, increase lunch and dinner insulin by 5 units each. Continue to Check blood sugars before meals and a few times fasting and at bedtime as well.  2- Coordination of care: request appointment with Internal Medicine doctor on behalf of pt.  Discuss foot exam with new doctor.    Monitoring/Evaluation:  Blood sugars, food intake at future doctor visit or pt can mail if > 3-4 weeks.

## 2011-09-08 ENCOUNTER — Other Ambulatory Visit: Payer: Self-pay | Admitting: Infectious Disease

## 2011-09-08 DIAGNOSIS — E119 Type 2 diabetes mellitus without complications: Secondary | ICD-10-CM

## 2011-09-08 MED ORDER — INSULIN ASPART 100 UNIT/ML ~~LOC~~ SOLN: SUBCUTANEOUS | Status: DC

## 2011-09-18 ENCOUNTER — Telehealth: Payer: Self-pay | Admitting: Dietician

## 2011-09-18 NOTE — Telephone Encounter (Signed)
Called pt to conform insulin doses. They are:  Novolog   3 units before breakfast  15 before lunch   10 at before dinner.  Lantus 50 units at bedtime  Patient says his blood sugars are much better, not over 300 mg/dl since change in Novolog. He is going to bring meter by Thursday for download.

## 2011-09-20 NOTE — Telephone Encounter (Signed)
Thanks Donna!

## 2011-09-21 ENCOUNTER — Ambulatory Visit (INDEPENDENT_AMBULATORY_CARE_PROVIDER_SITE_OTHER): Payer: Medicare Other | Admitting: *Deleted

## 2011-09-21 VITALS — BP 120/77 | HR 60 | Temp 97.9°F | Resp 16 | Wt 167.8 lb

## 2011-09-21 DIAGNOSIS — E785 Hyperlipidemia, unspecified: Secondary | ICD-10-CM

## 2011-09-21 DIAGNOSIS — B20 Human immunodeficiency virus [HIV] disease: Secondary | ICD-10-CM

## 2011-09-21 LAB — LIPID PANEL
Cholesterol: 125 mg/dL (ref 0–200)
LDL Cholesterol: 65 mg/dL (ref 0–99)
Total CHOL/HDL Ratio: 3.9 Ratio
Triglycerides: 141 mg/dL (ref <150)
VLDL: 28 mg/dL (ref 0–40)

## 2011-09-21 LAB — COMPREHENSIVE METABOLIC PANEL
Alkaline Phosphatase: 56 U/L (ref 39–117)
CO2: 26 mEq/L (ref 19–32)
Creat: 0.8 mg/dL (ref 0.50–1.35)
Glucose, Bld: 221 mg/dL — ABNORMAL HIGH (ref 70–99)
Sodium: 139 mEq/L (ref 135–145)
Total Bilirubin: 0.6 mg/dL (ref 0.3–1.2)
Total Protein: 6.3 g/dL (ref 6.0–8.3)

## 2011-09-21 LAB — CD4/CD8 (T-HELPER/T-SUPPRESSOR CELL)
CD4%: 23
CD8 % Suppressor T Cell: 62.2
CD8: 1804

## 2011-09-21 NOTE — Progress Notes (Signed)
[facility] is the [facility]. He has been followed on study for almost 15 years.  He denies any new problems. He recently got a new prosthesis for his rt leg and is having trouble getting adjusted to it. He has occasional pain and numbness in his left foot. He has been seeing the diabetes educator to help get his blood sugars regulated and under better control.

## 2011-09-27 ENCOUNTER — Telehealth: Payer: Self-pay | Admitting: Dietician

## 2011-09-27 NOTE — Telephone Encounter (Signed)
Patient brought meter in for download. Review of information:   Before breakfast 160-250 after breakfast 205- 242 Before lunch       186-250 after lunch  205-309 Before dinner 148-306 Before bed 199-342  Discussed 10-20% increase in prandial insulin doses with Dr. Synthia Innocent.   New doses called to patient who verbalized understanding by repeating them back: to 7 units before breakfast, 19 units before big meal at lunch and 13 units before dinner.  Requested patient check 3 AM blood sugar 1-2 x in next two weeks and if call us  < 110mg /dl.

## 2011-09-27 NOTE — Telephone Encounter (Signed)
Thanks Donna!

## 2011-10-10 ENCOUNTER — Ambulatory Visit (INDEPENDENT_AMBULATORY_CARE_PROVIDER_SITE_OTHER): Payer: Medicare Other | Admitting: Internal Medicine

## 2011-10-10 ENCOUNTER — Encounter: Payer: Self-pay | Admitting: Internal Medicine

## 2011-10-10 VITALS — BP 108/63 | HR 74 | Temp 97.4°F | Ht 73.0 in | Wt 173.5 lb

## 2011-10-10 DIAGNOSIS — D126 Benign neoplasm of colon, unspecified: Secondary | ICD-10-CM

## 2011-10-10 DIAGNOSIS — E785 Hyperlipidemia, unspecified: Secondary | ICD-10-CM

## 2011-10-10 DIAGNOSIS — I739 Peripheral vascular disease, unspecified: Secondary | ICD-10-CM | POA: Diagnosis not present

## 2011-10-10 DIAGNOSIS — B20 Human immunodeficiency virus [HIV] disease: Secondary | ICD-10-CM

## 2011-10-10 DIAGNOSIS — I10 Essential (primary) hypertension: Secondary | ICD-10-CM | POA: Diagnosis not present

## 2011-10-10 DIAGNOSIS — IMO0002 Reserved for concepts with insufficient information to code with codable children: Secondary | ICD-10-CM

## 2011-10-10 DIAGNOSIS — E119 Type 2 diabetes mellitus without complications: Secondary | ICD-10-CM | POA: Diagnosis not present

## 2011-10-10 DIAGNOSIS — IMO0001 Reserved for inherently not codable concepts without codable children: Secondary | ICD-10-CM | POA: Diagnosis not present

## 2011-10-10 DIAGNOSIS — G35 Multiple sclerosis: Secondary | ICD-10-CM

## 2011-10-10 LAB — POCT GLYCOSYLATED HEMOGLOBIN (HGB A1C): Hemoglobin A1C: 8.4

## 2011-10-10 MED ORDER — TRAMADOL HCL 50 MG PO TABS: 50.0000 mg | ORAL_TABLET | Freq: Four times a day (QID) | ORAL | Status: AC | PRN

## 2011-10-10 NOTE — Assessment & Plan Note (Signed)
Patient reports he had colonoscopy at [facility] in distant past ~ 10 years ago.  Called ID clinic for [facility] records after patient left, but they did not have record of a colonoscopy.  History unclear.  Patient is very possibly due for repeat colonoscopy, consider colonoscopy referral at next visit

## 2011-10-10 NOTE — Progress Notes (Signed)
Subjective:   Patient ID: Nathan [name] male   DOB: Mar 01, 1951 61 y.o.   MRN: [id number]  HPI: [name] is a 61 y.o. with well controlled HIV, PVD, HTN, HLD, DM type 2 who presents to establish care in our clinic.  He has seen [facility] for primary care and HIV care.  Now they will just be providing HIV care.    No specific complaints today besides wondering if pain medicine could help his leg pains.  L leg has burning and cold quality pains, worst at night.  R leg has phantom pains (s/p AKA).      No past medical history on file. Current Outpatient Prescriptions  Medication Sig Dispense Refill  . atorvastatin (LIPITOR) 40 MG tablet TAKE 1 TABLET BY MOUTH EVERY DAY  30 tablet  5  . Blood Glucose Monitoring Suppl (BAYER CONTOUR MONITOR) W/DEVICE KIT Use to check blood sugar as instructed up to 4 times a day  1 kit  0  . clopidogrel (PLAVIX) 75 MG tablet TAKE 1 TABLET BY MOUTH DAILY  30 tablet  5  . gabapentin (NEURONTIN) 300 MG capsule Take 1 capsule (300 mg total) by mouth at bedtime.  30 capsule  3  . Glucerna (GLUCERNA) LIQD Take 237 mLs by mouth 3 (three) times daily between meals.  237 mL  4  . glucose blood (BAYER CONTOUR NEXT TEST) test strip Four times daily before meals and at bedtime  100 each  11  . glucose blood (PRODIGY TEST) test strip Use as instructed To test blood sugar three times daily  100 each  3  . glucose monitoring kit (FREESTYLE) monitoring kit 1 each by Does not apply route as needed for other.  1 each  4  . insulin aspart (NOVOLOG FLEXPEN) 100 UNIT/ML injection Inject 13 units daily before breakfast, and 10 units before noon meal and 10 units before evening meal  1 pen  11  . insulin glargine (LANTUS) 100 UNIT/ML injection Inject 50 Units into the skin at bedtime.      . Insulin Pen Needle (PEN NEEDLES 3/16") 31G X 5 MM MISC 1 application by Does not apply route 2 (two) times daily. Use to inject insulin twice daily Dx code 250.00  100 each  11  .  lamiVUDine-zidovudine (COMBIVIR) 150-300 MG per tablet Take 1 tablet by mouth 2 (two) times daily.  60 tablet  5  . Lancets MISC 30 Units by Does not apply route 3 (three) times daily.  100 each  6  . LANTUS SOLOSTAR 100 UNIT/ML injection INJECT 36 UNITS SUBCUTANEOUSLY AT BEDTIME  15 mL  PRN  . lisinopril (PRINIVIL,ZESTRIL) 40 MG tablet TAKE 1 TABLET BY MOUTH EVERY DAY  30 tablet  PRN  . metFORMIN (GLUCOPHAGE) 500 MG tablet TAKE 2 TABLETS BY MOUTH TWICE A DAY  120 tablet  PRN  . omeprazole (PRILOSEC) 40 MG capsule TAKE 1 CAPSULE BY MOUTH EVERY DAY  30 capsule  11  . Oxycodone HCl 10 MG TABS       . raltegravir (ISENTRESS) 400 MG tablet Take 1 tablet (400 mg total) by mouth 2 (two) times daily.  60 tablet  5  . tenofovir (VIREAD) 300 MG tablet Take 1 tablet (300 mg total) by mouth daily.  30 tablet  5  . traMADol (ULTRAM) 50 MG tablet Take 1 tablet (50 mg total) by mouth every 6 (six) hours as needed for pain.  90 tablet  1   No family history  on file. History   Social History  . Marital Status: Single    Spouse Name: N/A    Number of Children: N/A  . Years of Education: N/A   Social History Main Topics  . Smoking status: Former Games developer  . Smokeless tobacco: None  . Alcohol Use: None  . Drug Use: None  . Sexually Active: None   Other Topics Concern  . None   Social History Narrative  . None   Review of Systems: Constitutional: Denies fever, chills, diaphoresis, appetite change and fatigue.     Respiratory: Denies SOB, DOE, cough, chest tightness,  and wheezing.   Cardiovascular: Denies chest pain, palpitations and leg swelling.  Gastrointestinal: Denies nausea, vomiting, abdominal pain, diarrhea, constipation, blood in stool and abdominal distention.  Genitourinary: Denies dysuria, urgency, frequency, hematuria, flank pain and difficulty urinating.   Skin: Denies pallor, rash and wound.  Neurological: Denies dizziness, seizures, syncope, weakness, light-headedness, numbness  and headaches.     Objective:  Physical Exam: Filed Vitals:   10/10/11 1403  BP: 108/63  Pulse: 74  Temp: 97.4 F (36.3 C)  TempSrc: Oral  Height: 6\' 1"  (1.854 m)  Weight: 173 lb 8 oz (78.699 kg)   Constitutional: Vital signs reviewed.  Patient is a well-developed and well-nourished man in no acute distress and cooperative with exam. Alert and oriented x3.  Head: Normocephalic and atraumatic  Mouth: no erythema or exudates, MMM Eyes: PERRL, EOMI, conjunctivae normal, No scleral icterus.  Neck: No JVD Cardiovascular: RRR, S1 normal, S2 normal, no MRG, pulses symmetric and intact bilaterally Pulmonary/Chest: CTAB, no wheezes, rales, or rhonchi  Musculoskeletal: R leg ends in AKA.  L leg has some hair loss around ankle but distal pulses 2+.  Neurological: A&O x3, Strength is normal and symmetric bilaterally in arms, intact in L leg, cranial nerve II-XII are grossly intact, no focal motor deficit, sensory intact to light touch bilaterally.   Skin: Warm, dry and intact. No rash, cyanosis, or clubbing.    Assessment & Plan:

## 2011-10-10 NOTE — Assessment & Plan Note (Signed)
HbA1c 8.4% today, too high.  On Lantus 50 units nightly, Novolog 7 units with breakfast, 19 units with big lunch, 13 units with dinner.  Sugars on log since last regimen increase on 5/22 all over 200, with increases throughout day.   -Increase Lantus by 2 units every 3 days until fasting AM sugar is <150.  Do not go above 64 units lantus.  Reports that he had pneumovax in December at [facility], but cannot find clinic note from them.  Listed in health maintenance as given in 2010, so either way seems up to date.   -Eye exam referral given (no eye exam in >3 years) -Increase meal coverage to 8,20,14 units -Fu diabetes educator in 2-3 weeks -Fu MD in clinic in 3 months

## 2011-10-10 NOTE — Assessment & Plan Note (Signed)
LDL 56 last month

## 2011-10-10 NOTE — Patient Instructions (Signed)
Increase Lantus by 2 units every three days (52 units tonight and next two days, then 54 units following three days, then 56 units for three days, etc) until morning fasting blood sugar is less than 150, then keep at that Lantus dose.  Do not go above Lantus dose of 64 units.  Increase mealtime insulin to 8 units with breakfast, 20 units with lunch, 14 units with dinner.    Please see Nathan [name] diabetic educator in 2-3 weeks.  Please return to clinic in 6 weeks to see doctor.    Please keep your eye exam appointment.

## 2011-10-10 NOTE — Assessment & Plan Note (Signed)
BP on lower side today, but first time it has been low.  Recheck in 3 months, if lowish again could decrease ACEI dose

## 2011-10-10 NOTE — Assessment & Plan Note (Signed)
Good control, followed by ID

## 2011-10-10 NOTE — Assessment & Plan Note (Signed)
History of extensive PVD.  Had bilateral stents in leg.  Lost R leg to BKA then AKA due to a stent occlusion.  Has burning pains in left leg and phantom pains in R leg.  Requested pain medicine.  He has real reason for pain.  Oxycodone helped in past, gabapentin did not.   Discussed options with him, offered tramadol and amitriptyline.  He wanted Tramadol PRN not scheduled amitriptyline because he does not want a scheduled medicine.  -Tramadol prescribed, could elevate to vicodin in future  -Continue plavix -If continues to want better pain control consider amitriptyline for his pain worse at night with neuropathic qualities

## 2011-10-10 NOTE — Assessment & Plan Note (Signed)
Reports he has had it since 1988, history of two flairs in 1990s, none since.  Not on any medication

## 2011-10-16 ENCOUNTER — Encounter: Payer: Self-pay | Admitting: Infectious Disease

## 2011-11-02 ENCOUNTER — Ambulatory Visit (INDEPENDENT_AMBULATORY_CARE_PROVIDER_SITE_OTHER): Payer: Medicare Other | Admitting: Dietician

## 2011-11-02 ENCOUNTER — Telehealth: Payer: Self-pay | Admitting: Dietician

## 2011-11-02 ENCOUNTER — Other Ambulatory Visit: Payer: Self-pay | Admitting: Dietician

## 2011-11-02 ENCOUNTER — Encounter: Payer: Self-pay | Admitting: Dietician

## 2011-11-02 VITALS — Ht 73.0 in | Wt 173.4 lb

## 2011-11-02 DIAGNOSIS — E119 Type 2 diabetes mellitus without complications: Secondary | ICD-10-CM

## 2011-11-02 MED ORDER — "PEN NEEDLES 3/16"" 31G X 5 MM MISC": 1.0000 "application " | Freq: Four times a day (QID) | Status: DC

## 2011-11-02 MED ORDER — INSULIN GLARGINE 100 UNIT/ML ~~LOC~~ SOLN: 64.0000 [IU] | Freq: Every day | SUBCUTANEOUS | Status: DC

## 2011-11-02 MED ORDER — INSULIN ASPART 100 UNIT/ML ~~LOC~~ SOLN: SUBCUTANEOUS | Status: DC

## 2011-11-02 NOTE — Telephone Encounter (Signed)
Per discussion with Dr. Aundria Rud. Patient requests new prescriptions be sent to [facility] as Lantus dose exceeds current prescription and needs Novolog Flexpens and more insulin pen needles for injections.

## 2011-11-02 NOTE — Telephone Encounter (Signed)
All 3 Rx called in pharmacy.

## 2011-11-02 NOTE — Telephone Encounter (Signed)
Opened in error

## 2011-11-02 NOTE — Patient Instructions (Addendum)
You are doing a great job taking care of your diabetes!!!  Increase your insulin as follows:  1- Lantus increase to 64 units daily 2- Novolog increase to   10 units with Breakfast  22 units with Lunch  16 units with Dinner  I will ask the doctor to send a new prescription for the above insulins.  Please bring your meter to the office and have it downloaded in 3 weeks.

## 2011-11-02 NOTE — Progress Notes (Signed)
Blood sugar today after coffee with creamer was 291/318.   Medical Nutrition Therapy: Annual follow up 3:  Appt start time: 935 end time:  1015.  Assessment:  Primary concerns today: Blood sugar control  Usual eating pattern unchanged. Weight increased slightly. No signs or symptoms of high or low blood sugar. Brought meter which was downloaded. Blood sugars: trend over past 30 days shows improvement towards target.  ~average of past 2 weeks 239- decreased from 298. Lowest blood sugar was 110 after full day of gardening. Usual physical activity includes ADLs- which patient reports he stays busy, gardening Medicine using 60 units lantus and 8, 20 and 14 units Novolog with meals for TDD of 102 units/day which is 1.32 units/kg/day  Progress Towards Goal(s):  In progress   Nutritional Diagnosis:   [place]-2.2 Altered nutrition-related laboratory As related to his diabetes and profound insulin resisance as evidenced by high high blood sugars, A1C and insulin needs.  NI-5.6.3 Inappropriate intake of fats (specify): transfats, saturated and polyunsaturated fats as related to cardiovascular disease risk and metabolic syndrome  Improving.  Interventions: 1- Education about insulin needs and insulin resistance: patient desires to increase insulin by 20%. Discussed with attending physician who gave approval.Continue to Check blood sugars before meals and a few times fasting and at bedtime as well.  2- Coordination of care: request [facility]. Patient acknowledges receipt of eye doctor appointment.  Gave patient Health Maintenance report to follow up with ID about immunizations. Faxed Rx and CMN with office notes to [employer].     Monitoring/Evaluation:   1- see patient instructions.

## 2011-11-08 ENCOUNTER — Other Ambulatory Visit: Payer: Self-pay | Admitting: *Deleted

## 2011-11-08 NOTE — Telephone Encounter (Signed)
Denied. Pt was given Tramadol #90 with one refill on 10/10/2011. He should have one RF remaining at pharmacy.   Pt was to F/U mid July. No appt in EMR. Pls sch mid-late July

## 2011-11-15 ENCOUNTER — Ambulatory Visit (INDEPENDENT_AMBULATORY_CARE_PROVIDER_SITE_OTHER): Payer: Medicare Other | Admitting: Infectious Disease

## 2011-11-15 ENCOUNTER — Encounter: Payer: Self-pay | Admitting: Infectious Disease

## 2011-11-15 VITALS — BP 145/76 | HR 69 | Temp 97.4°F | Ht 73.0 in | Wt 174.0 lb

## 2011-11-15 DIAGNOSIS — Z8619 Personal history of other infectious and parasitic diseases: Secondary | ICD-10-CM | POA: Diagnosis not present

## 2011-11-15 DIAGNOSIS — B20 Human immunodeficiency virus [HIV] disease: Secondary | ICD-10-CM | POA: Diagnosis not present

## 2011-11-15 DIAGNOSIS — R52 Pain, unspecified: Secondary | ICD-10-CM | POA: Diagnosis not present

## 2011-11-15 DIAGNOSIS — IMO0001 Reserved for inherently not codable concepts without codable children: Secondary | ICD-10-CM | POA: Diagnosis not present

## 2011-11-15 MED ORDER — OXYCODONE HCL 10 MG PO TABS: 10.0000 mg | ORAL_TABLET | Freq: Three times a day (TID) | ORAL | Status: DC | PRN

## 2011-11-15 NOTE — Assessment & Plan Note (Signed)
Following with [facility]

## 2011-11-15 NOTE — Assessment & Plan Note (Signed)
Perfect control 

## 2011-11-15 NOTE — Assessment & Plan Note (Signed)
On lamivudine and TNF

## 2011-11-15 NOTE — Progress Notes (Signed)
  Subjective:    Patient ID: Nathan [name], male    DOB: 07-02-1950, 61 y.o.   MRN: [id number]  HPI  Nathan [name] is a 61 y.o. male who is doing superbly well on his  antiviral regimen, with twice daily combivir, intelence and once daily viread with undetectable viral load and health cd4 count. He saw Dr. Larey Seat once this Spring and will fu with [facility] for IM needs. He has no  Complaints today.   Review of Systems  Constitutional: Negative for fever, chills, diaphoresis, activity change, appetite change, fatigue and unexpected weight change.  HENT: Negative for congestion, sore throat, rhinorrhea, sneezing, trouble swallowing and sinus pressure.   Eyes: Negative for photophobia and visual disturbance.  Respiratory: Negative for cough, chest tightness, shortness of breath, wheezing and stridor.   Cardiovascular: Negative for chest pain, palpitations and leg swelling.  Gastrointestinal: Negative for nausea, vomiting, abdominal pain, diarrhea, constipation, blood in stool, abdominal distention and anal bleeding.  Genitourinary: Negative for dysuria, hematuria, flank pain and difficulty urinating.  Musculoskeletal: Positive for arthralgias. Negative for myalgias, back pain, joint swelling and gait problem.  Skin: Negative for color change, pallor, rash and wound.  Neurological: Negative for dizziness, tremors, weakness and light-headedness.  Hematological: Negative for adenopathy. Does not bruise/bleed easily.  Psychiatric/Behavioral: Negative for behavioral problems, confusion, disturbed wake/sleep cycle, dysphoric mood, decreased concentration and agitation.       Objective:   Physical Exam  Constitutional: He is oriented to person, place, and time. He appears well-developed and well-nourished. No distress.  HENT:  Head: Normocephalic and atraumatic.  Mouth/Throat: Oropharynx is clear and moist. No oropharyngeal exudate.  Eyes: Conjunctivae and EOM are normal. Pupils are equal, round,  and reactive to light. No scleral icterus.  Neck: Normal range of motion. Neck supple. No JVD present.  Cardiovascular: Normal rate, regular rhythm and normal heart sounds.  Exam reveals no gallop and no friction rub.   No murmur heard. Pulmonary/Chest: Effort normal and breath sounds normal. No respiratory distress. He has no wheezes. He has no rales. He exhibits no tenderness.  Abdominal: He exhibits no distension and no mass. There is no tenderness. There is no rebound and no guarding.  Musculoskeletal: He exhibits no edema and no tenderness.       Legs: Lymphadenopathy:    He has no cervical adenopathy.  Neurological: He is alert and oriented to person, place, and time. He has normal reflexes. He exhibits normal muscle tone. Coordination normal.  Skin: Skin is warm and dry. He is not diaphoretic. No erythema. No pallor.  Psychiatric: He has a normal mood and affect. His behavior is normal. Judgment and thought content normal.          Assessment & Plan:  HIV DISEASE Perfect control!  HEPATITIS B, HX OF On lamivudine and TNF  DIABETES MELLITUS, TYPE II, UNCONTROLLED Following with [facility]

## 2011-11-23 ENCOUNTER — Encounter: Payer: Self-pay | Admitting: Internal Medicine

## 2011-11-23 ENCOUNTER — Ambulatory Visit (INDEPENDENT_AMBULATORY_CARE_PROVIDER_SITE_OTHER): Payer: Medicare Other | Admitting: Internal Medicine

## 2011-11-23 VITALS — BP 106/56 | HR 70 | Temp 97.8°F | Ht 73.0 in | Wt 172.2 lb

## 2011-11-23 DIAGNOSIS — Z0000 Encounter for general adult medical examination without abnormal findings: Secondary | ICD-10-CM | POA: Diagnosis not present

## 2011-11-23 DIAGNOSIS — K219 Gastro-esophageal reflux disease without esophagitis: Secondary | ICD-10-CM

## 2011-11-23 DIAGNOSIS — IMO0002 Reserved for concepts with insufficient information to code with codable children: Secondary | ICD-10-CM

## 2011-11-23 DIAGNOSIS — R3989 Other symptoms and signs involving the genitourinary system: Secondary | ICD-10-CM | POA: Insufficient documentation

## 2011-11-23 DIAGNOSIS — B20 Human immunodeficiency virus [HIV] disease: Secondary | ICD-10-CM | POA: Diagnosis not present

## 2011-11-23 DIAGNOSIS — I739 Peripheral vascular disease, unspecified: Secondary | ICD-10-CM

## 2011-11-23 DIAGNOSIS — E785 Hyperlipidemia, unspecified: Secondary | ICD-10-CM | POA: Diagnosis not present

## 2011-11-23 DIAGNOSIS — IMO0001 Reserved for inherently not codable concepts without codable children: Secondary | ICD-10-CM | POA: Diagnosis not present

## 2011-11-23 DIAGNOSIS — D126 Benign neoplasm of colon, unspecified: Secondary | ICD-10-CM | POA: Diagnosis not present

## 2011-11-23 DIAGNOSIS — R894 Abnormal immunological findings in specimens from other organs, systems and tissues: Secondary | ICD-10-CM

## 2011-11-23 DIAGNOSIS — N399 Disorder of urinary system, unspecified: Secondary | ICD-10-CM

## 2011-11-23 DIAGNOSIS — S78119A Complete traumatic amputation at level between unspecified hip and knee, initial encounter: Secondary | ICD-10-CM | POA: Diagnosis not present

## 2011-11-23 DIAGNOSIS — Z8619 Personal history of other infectious and parasitic diseases: Secondary | ICD-10-CM

## 2011-11-23 DIAGNOSIS — G35 Multiple sclerosis: Secondary | ICD-10-CM

## 2011-11-23 DIAGNOSIS — R768 Other specified abnormal immunological findings in serum: Secondary | ICD-10-CM | POA: Insufficient documentation

## 2011-11-23 DIAGNOSIS — G547 Phantom limb syndrome without pain: Secondary | ICD-10-CM

## 2011-11-23 LAB — GLUCOSE, CAPILLARY: Glucose-Capillary: 122 mg/dL — ABNORMAL HIGH (ref 70–99)

## 2011-11-23 NOTE — Assessment & Plan Note (Addendum)
Lipid Panel     Component Value Date/Time   CHOL 125 09/21/2011 0953   TRIG 141 09/21/2011 0953   HDL 32* 09/21/2011 0953   CHOLHDL 3.9 09/21/2011 0953   VLDL 28 09/21/2011 0953   LDLCALC 65 09/21/2011 0953    Cont Lipitor, last lipid panel at goal except low HDL

## 2011-11-23 NOTE — Assessment & Plan Note (Signed)
On Prilosec, helping

## 2011-11-23 NOTE — Patient Instructions (Addendum)
Increase your breakfast mealtime Novolog 12 units Keep you lunchtime Novolog at 22 units Increase you dinner Novolog to 18 units   Lantus is the same

## 2011-11-23 NOTE — Assessment & Plan Note (Signed)
+  phanthom leg pain right limb Pt is tolerating prosthesis well

## 2011-11-23 NOTE — Assessment & Plan Note (Signed)
Noted in 2008 Pt unaware of dx of Hep B, states it remains in his chart but he has never received tx

## 2011-11-23 NOTE — Assessment & Plan Note (Signed)
Pt is due for colonoscopy  Placed referral GI Will get records from [facility] (pt saw yrs ago)

## 2011-11-23 NOTE — Progress Notes (Signed)
  Subjective:    Patient ID: Nathan [name], male    DOB: [birth year], 62 y.o.   MRN: [id number]  HPI Comments: 61 y.o male PMH HIV (undetectable viral load and last CD4 ct 667), DM (uncontrolled), HLD, HTN, MS (in remission), colon polyps, AKA right leg with prothesis presents to clinic for follow up.  He does not have any complaints and also follows with ID.    Pt reports his DM is the only medical problem not in control currently.  He states his sugars range in the 200s and he has not experienced hypoglycemic episodes. His current DM tx is Novolog 10 units at breakfast, 22 units at lunch, and 16 units at dinner.  He sometimes skips breakfast as he did this am and he had not had lunch this am either.  He reports compliance and has brought his meter in for reading today.    He reports he has not had a colonoscopy in a while and his brother had stage 4 colon cancer now.  He inquires about his health maintenance vaccines as well.    SH: he lives with his roommate, he is retired from Designer, fashion/clothing     Review of Systems  Constitutional: Negative for fever and fatigue.  HENT:       Denies sore throat  Respiratory: Negative for shortness of breath.   Cardiovascular: Negative for chest pain.  Gastrointestinal:       Denies ab pain  Genitourinary: Positive for difficulty urinating.       Noted urine flow changes, feels like stream continues to dribble and like cant completely empty his bladder   It is harder for pt to urinate, which is new (slower flow at end of urine stream)  Musculoskeletal: Negative for arthralgias.  Neurological: Negative for dizziness and light-headedness.       + tingling sensation left leg (chronic)-following with Vascular s/p placement of stents  +phantom pain right amputated site         Objective:   Physical Exam  Nursing note and vitals reviewed. Constitutional: He is oriented to person, place, and time. Vital signs are normal. He appears well-developed and  well-nourished. He is cooperative. No distress.       Very pleasant, good sense of humor, well dressed pt  HENT:  Head: Normocephalic and atraumatic.  Mouth/Throat: Oropharynx is clear and moist and mucous membranes are normal. No oropharyngeal exudate.  Eyes: Conjunctivae are normal. Pupils are equal, round, and reactive to light. No scleral icterus.  Cardiovascular: Normal rate, regular rhythm, S1 normal, S2 normal and normal heart sounds.   No murmur heard. Pulmonary/Chest: Effort normal and breath sounds normal. No respiratory distress. He has no wheezes.  Abdominal: Soft. Normal appearance and bowel sounds are normal. He exhibits no distension. There is no tenderness.  Genitourinary:       Pt declines DRE today  Musculoskeletal:       Legs: Neurological: He is alert and oriented to person, place, and time.  Skin: Skin is warm, dry and intact. No rash noted. He is not diaphoretic.  Psychiatric: He has a normal mood and affect. His speech is normal and behavior is normal. Judgment normal. Cognition and memory are normal.          Assessment & Plan:  Follow up FSBS 1 month after increasing breakfast and dinner units of Novolog both by 2 units (total 4 units)

## 2011-11-23 NOTE — Assessment & Plan Note (Addendum)
Well controlled Undetectable viral load and last CD4 ct 667 Pt compliant with meds  Follows with ID on (Combivir, Isentress, Viread)

## 2011-11-23 NOTE — Assessment & Plan Note (Addendum)
Uncontrolled Last HA1C 8.4%, fsbs 122 today (pt did not eat breakfast or lunch) See meter log for today (home fsbs run >200 qd) Pt denies sx's of hypoglycemia or hyperglycemia  Plan: Pt current on meal time coverage Novolog 02/27/15-->increased mealtime coverage Novolog 04/28/17 Cont Lantus 64 units qhs Pt has eye exam 12/16/11 with Dr. Dione Booze Advised pt not to skip meals

## 2011-11-23 NOTE — Assessment & Plan Note (Signed)
Controlled BP today 106/56, pt not sx'matic BMP wnl 09/21/11 Cont Lisinopril 40 mg qd

## 2011-11-23 NOTE — Assessment & Plan Note (Addendum)
Pt has stents in left leg and previous right lower leg AKA (w/ prothesis) He follows with Vascular Dr. Lindalou Hose

## 2011-11-23 NOTE — Assessment & Plan Note (Signed)
In remission, dx'ed in late 1980s no current tx

## 2011-11-23 NOTE — Assessment & Plan Note (Signed)
Pt reports newly noted problems at the end of urinary stream, his urine will not stop and has dribbling but denies trouble starting stream Pt feels like flow is slower at the end and he may not completely empty bladder  Disc DRE today but pt declines Obtained UA If persistent pt will need DRE, PSA, and consider Flomax low dose daily Will ask about at sx's at serial f/u's

## 2011-11-23 NOTE — Assessment & Plan Note (Signed)
Pain controlled w/ Oxycodone but uses sparingly

## 2011-11-23 NOTE — Assessment & Plan Note (Signed)
Pt signed release of records to get colonoscopy records from [facility] (yrs ago) Made referral for colonoscopy today Pt due for Tdap but insurance will not cover-will call ID clinic to see if he can get Tdap in ID clinic pneumoccocal vx due when pt turns 63 (in 2015)

## 2011-11-24 ENCOUNTER — Telehealth: Payer: Self-pay | Admitting: Internal Medicine

## 2011-11-24 NOTE — Telephone Encounter (Signed)
Informed pt spoke with ID clinic, billing. His insurance will not cover Tdap it will be billed but more than likely not covered by his insurance.  He will hold on Tdap for now. Advised he can try going to a pharmacy to see if he can get Tdap there.

## 2011-11-24 NOTE — Addendum Note (Signed)
Addended by: Annett Gula on: 11/24/2011 09:29 AM   Modules accepted: Orders

## 2011-11-28 NOTE — Progress Notes (Signed)
I saw patient and discussed her care with resident Dr. McLean.  I agree with the clinical findings and plans as outlined in her note. 

## 2011-11-29 ENCOUNTER — Telehealth: Payer: Self-pay | Admitting: *Deleted

## 2011-11-29 DIAGNOSIS — G35 Multiple sclerosis: Secondary | ICD-10-CM

## 2011-11-29 MED ORDER — TRAMADOL HCL 50 MG PO TABS: 50.0000 mg | ORAL_TABLET | Freq: Four times a day (QID) | ORAL | Status: DC | PRN

## 2011-11-29 NOTE — Telephone Encounter (Signed)
Needs refill on Tramadol 50mg  #90 - [facility]. Med is on hx med sheet.

## 2011-12-04 ENCOUNTER — Encounter: Payer: Self-pay | Admitting: Internal Medicine

## 2011-12-05 ENCOUNTER — Other Ambulatory Visit: Payer: Self-pay | Admitting: Licensed Clinical Social Worker

## 2011-12-05 DIAGNOSIS — E7800 Pure hypercholesterolemia, unspecified: Secondary | ICD-10-CM

## 2011-12-05 DIAGNOSIS — I739 Peripheral vascular disease, unspecified: Secondary | ICD-10-CM

## 2011-12-05 MED ORDER — CLOPIDOGREL BISULFATE 75 MG PO TABS: 75.0000 mg | ORAL_TABLET | Freq: Every day | ORAL | Status: DC

## 2011-12-05 MED ORDER — ATORVASTATIN CALCIUM 40 MG PO TABS: 40.0000 mg | ORAL_TABLET | Freq: Every day | ORAL | Status: DC

## 2011-12-07 ENCOUNTER — Telehealth: Payer: Self-pay | Admitting: Dietician

## 2011-12-07 NOTE — Telephone Encounter (Signed)
Left message requesting patient bring meter in for download to evaluate effect of recent insulin change and assess need for additional change

## 2011-12-08 ENCOUNTER — Encounter: Payer: Self-pay | Admitting: Neurosurgery

## 2011-12-11 ENCOUNTER — Encounter (INDEPENDENT_AMBULATORY_CARE_PROVIDER_SITE_OTHER): Payer: Medicare Other | Admitting: *Deleted

## 2011-12-11 ENCOUNTER — Encounter: Payer: Self-pay | Admitting: Neurosurgery

## 2011-12-11 ENCOUNTER — Ambulatory Visit (INDEPENDENT_AMBULATORY_CARE_PROVIDER_SITE_OTHER): Payer: Medicare Other | Admitting: *Deleted

## 2011-12-11 ENCOUNTER — Other Ambulatory Visit (INDEPENDENT_AMBULATORY_CARE_PROVIDER_SITE_OTHER): Payer: Medicare Other | Admitting: *Deleted

## 2011-12-11 ENCOUNTER — Ambulatory Visit (INDEPENDENT_AMBULATORY_CARE_PROVIDER_SITE_OTHER): Payer: Medicare Other | Admitting: Neurosurgery

## 2011-12-11 VITALS — BP 119/73 | HR 69 | Resp 16 | Ht 73.0 in | Wt 173.7 lb

## 2011-12-11 DIAGNOSIS — Z48812 Encounter for surgical aftercare following surgery on the circulatory system: Secondary | ICD-10-CM

## 2011-12-11 DIAGNOSIS — I739 Peripheral vascular disease, unspecified: Secondary | ICD-10-CM

## 2011-12-11 DIAGNOSIS — I6529 Occlusion and stenosis of unspecified carotid artery: Secondary | ICD-10-CM

## 2011-12-11 NOTE — Progress Notes (Signed)
[facility]  Carotid Office Note  CC: Annual PVD and carotid surveillance Referring Physician: Brabham  History of Present Illness: 61 year old male patient of Dr. Myra Gianotti is status post a right AKA as well as a left popliteal stent, the left popliteal stent was in 2010. The patient denies any claudication, rest pain or open ulcerations. The patient denies any signs or symptoms of CVA, TIA, amaurosis fugax or neural deficit. The patient denies any new medical diagnoses or recent surgeries.  Past Medical History  Diagnosis Date  . Diabetes mellitus   . HIV infection     undetectable viral load and CD4 ct 667 as of 11/2011  . Colon polyps     noted previous colonoscopy [facility]  . Hyperlipidemia   . Multiple sclerosis     in remission as of 11/2011 (diagnosed late 1980s)  . GERD (gastroesophageal reflux disease)   . Hypertension   . MRSA (methicillin resistant Staphylococcus aureus)   . PCP (pneumocystis jiroveci pneumonia)     2002  . History of syphilis     noted [facility] records  . Asthma     per 2003 [facility] pulm records pfts   . DDD (degenerative disc disease)     cervical spine  . PVD (peripheral vascular disease)     Left Stent 07/02/2008    ROS: [x]  Positive   [ ]  Denies    General: [ ]  Weight loss, [ ]  Fever, [ ]  chills Neurologic: [ ]  Dizziness, [ ]  Blackouts, [ ]  Seizure [ ]  Stroke, [ ]  "Mini stroke", [ ]  Slurred speech, [ ]  Temporary blindness; [ ]  weakness in arms or legs, [ ]  Hoarseness Cardiac: [ ]  Chest pain/pressure, [ ]  Shortness of breath at rest [ ]  Shortness of breath with exertion, [ ]  Atrial fibrillation or irregular heartbeat Vascular: [ ]  Pain in legs with walking, [ ]  Pain in legs at rest, [ ]  Pain in legs at night,  [ ]  Non-healing ulcer, [ ]  Blood clot in vein/DVT,   Pulmonary: [ ]  Home oxygen, [ ]  Productive cough, [ ]  Coughing up blood, [ ]  Asthma,  [ ]  Wheezing Musculoskeletal:  [ ]  Arthritis, [ ]  Low back pain, [ ]  Joint  pain Hematologic: [ ]  Easy Bruising, [ ]  Anemia; [ ]  Hepatitis Gastrointestinal: [ ]  Blood in stool, [ ]  Gastroesophageal Reflux/heartburn, [ ]  Trouble swallowing Urinary: [ ]  chronic Kidney disease, [ ]  on HD - [ ]  MWF or [ ]  TTHS, [ ]  Burning with urination, [ ]  Difficulty urinating Skin: [ ]  Rashes, [ ]  Wounds Psychological: [ ]  Anxiety, [ ]  Depression   Social History History  Substance Use Topics  . Smoking status: Former Games developer  . Smokeless tobacco: Not on file  . Alcohol Use: Yes    Family History Family History  Problem Relation Age of Onset  . Diabetes Mother   . Cancer Brother     colon caner stage 4 as of 11/2011 (unknown age of onset)    Allergies  Allergen Reactions  . Sulfonamide Derivatives     hives    Current Outpatient Prescriptions  Medication Sig Dispense Refill  . atorvastatin (LIPITOR) 40 MG tablet Take 1 tablet (40 mg total) by mouth daily.  30 tablet  5  . Blood Glucose Monitoring Suppl (BAYER CONTOUR MONITOR) W/DEVICE KIT Use to check blood sugar as instructed up to 4 times a day  1 kit  0  . clopidogrel (PLAVIX) 75 MG tablet Take  1 tablet (75 mg total) by mouth daily.  30 tablet  5  . glucose blood (BAYER CONTOUR NEXT TEST) test strip Four times daily before meals and at bedtime  100 each  11  . insulin aspart (NOVOLOG FLEXPEN) 100 UNIT/ML injection Inject subcutaneously 10 units before breakfast, 22 units before lunch, 16 units before supper  15 mL  12  . insulin glargine (LANTUS SOLOSTAR) 100 UNIT/ML injection Inject 64 Units into the skin daily.  30 mL  12  . Insulin Pen Needle (PEN NEEDLES 3/16") 31G X 5 MM MISC 1 application by Does not apply route 4 (four) times daily. Use to inject insulin twice daily Dx code 250.00  120 each  11  . lamiVUDine-zidovudine (COMBIVIR) 150-300 MG per tablet Take 1 tablet by mouth 2 (two) times daily.  60 tablet  5  . Lancets MISC 30 Units by Does not apply route 3 (three) times daily.  100 each  6  . lisinopril  (PRINIVIL,ZESTRIL) 40 MG tablet TAKE 1 TABLET BY MOUTH EVERY DAY  30 tablet  PRN  . metFORMIN (GLUCOPHAGE) 500 MG tablet TAKE 2 TABLETS BY MOUTH TWICE A DAY  120 tablet  PRN  . omeprazole (PRILOSEC) 40 MG capsule TAKE 1 CAPSULE BY MOUTH EVERY DAY  30 capsule  11  . Oxycodone HCl 10 MG TABS Take 1 tablet (10 mg total) by mouth 3 (three) times daily as needed.  90 tablet  0  . raltegravir (ISENTRESS) 400 MG tablet Take 1 tablet (400 mg total) by mouth 2 (two) times daily.  60 tablet  5  . tenofovir (VIREAD) 300 MG tablet Take 1 tablet (300 mg total) by mouth daily.  30 tablet  5    Physical Examination  Filed Vitals:   12/11/11 1515  BP: 119/73  Pulse: 69  Resp:     Body mass index is 22.92 kg/(m^2).  General:  WDWN in NAD Gait: Normal HEENT: WNL Eyes: Pupils equal Pulmonary: normal non-labored breathing , without Rales, rhonchi,  wheezing Cardiac: RRR, without  Murmurs, rubs or gallops; Abdomen: soft, NT, no masses Skin: no rashes, ulcers noted  Vascular Exam Pulses: 3+ radial pulses bilaterally, palpable left lower extremity pulses Carotid bruits: Carotid pulses to auscultation no bruits are Extremities without ischemic changes, no Gangrene , no cellulitis; no open wounds;  Musculoskeletal: no muscle wasting or atrophy   Neurologic: A&O X 3; Appropriate Affect ; SENSATION: normal; MOTOR FUNCTION:  moving all extremities equally. Speech is fluent/normal  Non-Invasive Vascular Imaging CAROTID DUPLEX 12/11/2011  Right ICA 40 - 59 % stenosis Left ICA 40 - 59 % stenosis Lower stream the duplex shows a patent popliteal stent with some elevation at the distal segment of 467 cm a second, ABI is 0.91 and triphasic on the left, this was reviewed with Dr. Myra Gianotti who has requested a left lower extremity arteriogram with possible intervention to investigate the elevated velocity.  ASSESSMENT/PLAN: Asymptomatic carotid stenosis, asymptomatic left lower stream in the PVD with elevation in  distal segment of popliteal stent. The patient will undergo a arteriogram with possible intervention left lower extremity with Dr. Myra Gianotti in the next week to 2 weeks, his followup-year-old be pending that procedure, he will followup in one year with repeat carotid duplex. The patient's questions were encouraged and answered, he is in agreement with this plan.  Lauree Chandler ANP   Clinic MD: Myra Gianotti

## 2011-12-18 ENCOUNTER — Other Ambulatory Visit: Payer: Self-pay

## 2011-12-20 ENCOUNTER — Encounter (HOSPITAL_COMMUNITY): Payer: Self-pay | Admitting: Pharmacy Technician

## 2011-12-25 MED ORDER — SODIUM CHLORIDE 0.9 % IV SOLN
INTRAVENOUS | Status: DC
  Administered 2011-12-26: 1000 mL via INTRAVENOUS

## 2011-12-26 ENCOUNTER — Other Ambulatory Visit: Payer: Self-pay | Admitting: *Deleted

## 2011-12-26 ENCOUNTER — Encounter (HOSPITAL_COMMUNITY)
Admission: RE | Disposition: A | Discharge status: Home / Self Care | Payer: Self-pay | Source: Ambulatory Visit | Attending: Surgery

## 2011-12-26 ENCOUNTER — Ambulatory Visit (HOSPITAL_COMMUNITY)
Admission: RE | Admit: 2011-12-26 | Discharge: 2011-12-26 | Disposition: A | Discharge status: Home / Self Care | Payer: Medicare Other | Source: Ambulatory Visit | Attending: Surgery | Admitting: Surgery

## 2011-12-26 DIAGNOSIS — E119 Type 2 diabetes mellitus without complications: Secondary | ICD-10-CM | POA: Diagnosis not present

## 2011-12-26 DIAGNOSIS — S78119A Complete traumatic amputation at level between unspecified hip and knee, initial encounter: Secondary | ICD-10-CM | POA: Insufficient documentation

## 2011-12-26 DIAGNOSIS — Z48812 Encounter for surgical aftercare following surgery on the circulatory system: Secondary | ICD-10-CM

## 2011-12-26 DIAGNOSIS — I70209 Unspecified atherosclerosis of native arteries of extremities, unspecified extremity: Secondary | ICD-10-CM | POA: Insufficient documentation

## 2011-12-26 DIAGNOSIS — K219 Gastro-esophageal reflux disease without esophagitis: Secondary | ICD-10-CM | POA: Insufficient documentation

## 2011-12-26 DIAGNOSIS — G35 Multiple sclerosis: Secondary | ICD-10-CM | POA: Insufficient documentation

## 2011-12-26 DIAGNOSIS — I10 Essential (primary) hypertension: Secondary | ICD-10-CM | POA: Insufficient documentation

## 2011-12-26 DIAGNOSIS — B20 Human immunodeficiency virus [HIV] disease: Secondary | ICD-10-CM | POA: Diagnosis not present

## 2011-12-26 DIAGNOSIS — I739 Peripheral vascular disease, unspecified: Secondary | ICD-10-CM | POA: Diagnosis not present

## 2011-12-26 DIAGNOSIS — Z8614 Personal history of Methicillin resistant Staphylococcus aureus infection: Secondary | ICD-10-CM | POA: Insufficient documentation

## 2011-12-26 DIAGNOSIS — I70219 Atherosclerosis of native arteries of extremities with intermittent claudication, unspecified extremity: Secondary | ICD-10-CM

## 2011-12-26 HISTORY — PX: LOWER EXTREMITY ANGIOGRAM: SHX5508

## 2011-12-26 LAB — POCT I-STAT, CHEM 8
Calcium, Ion: 1.23 mmol/L (ref 1.13–1.30)
Glucose, Bld: 156 mg/dL — ABNORMAL HIGH (ref 70–99)
HCT: 38 % — ABNORMAL LOW (ref 39.0–52.0)
Hemoglobin: 12.9 g/dL — ABNORMAL LOW (ref 13.0–17.0)

## 2011-12-26 SURGERY — ANGIOGRAM, LOWER EXTREMITY
Anesthesia: LOCAL | Laterality: Left

## 2011-12-26 MED ORDER — LIDOCAINE HCL (PF) 1 % IJ SOLN
INTRAMUSCULAR | Status: AC
  Filled 2011-12-26: qty 30

## 2011-12-26 MED ORDER — ONDANSETRON HCL 4 MG/2ML IJ SOLN: 4.0000 mg | Freq: Four times a day (QID) | INTRAMUSCULAR | Status: DC | PRN

## 2011-12-26 MED ORDER — GUAIFENESIN-DM 100-10 MG/5ML PO SYRP: 15.0000 mL | ORAL_SOLUTION | ORAL | Status: DC | PRN

## 2011-12-26 MED ORDER — MIDAZOLAM HCL 2 MG/2ML IJ SOLN
INTRAMUSCULAR | Status: AC
  Filled 2011-12-26: qty 2

## 2011-12-26 MED ORDER — HEPARIN (PORCINE) IN NACL 2-0.9 UNIT/ML-% IJ SOLN
INTRAMUSCULAR | Status: AC
  Filled 2011-12-26: qty 2000

## 2011-12-26 MED ORDER — CLONIDINE HCL 0.2 MG PO TABS: 0.2000 mg | ORAL_TABLET | ORAL | Status: DC | PRN

## 2011-12-26 MED ORDER — PHENOL 1.4 % MT LIQD: 1.0000 | OROMUCOSAL | Status: DC | PRN

## 2011-12-26 MED ORDER — SODIUM CHLORIDE 0.9 % IV SOLN: INTRAVENOUS | Status: DC

## 2011-12-26 MED ORDER — MORPHINE SULFATE 10 MG/ML IJ SOLN: 2.0000 mg | INTRAMUSCULAR | Status: DC | PRN

## 2011-12-26 MED ORDER — ACETAMINOPHEN 325 MG PO TABS: 325.0000 mg | ORAL_TABLET | ORAL | Status: DC | PRN

## 2011-12-26 MED ORDER — METOPROLOL TARTRATE 1 MG/ML IV SOLN: 2.0000 mg | INTRAVENOUS | Status: DC | PRN

## 2011-12-26 MED ORDER — FENTANYL CITRATE 0.05 MG/ML IJ SOLN
INTRAMUSCULAR | Status: AC
  Filled 2011-12-26: qty 2

## 2011-12-26 MED ORDER — ACETAMINOPHEN 325 MG RE SUPP: 325.0000 mg | RECTAL | Status: DC | PRN

## 2011-12-26 MED ORDER — ALUM & MAG HYDROXIDE-SIMETH 200-200-20 MG/5ML PO SUSP: 15.0000 mL | ORAL | Status: DC | PRN

## 2011-12-26 MED ORDER — OXYCODONE HCL 5 MG PO TABS: 5.0000 mg | ORAL_TABLET | ORAL | Status: DC | PRN

## 2011-12-26 NOTE — Progress Notes (Signed)
UP AND WALKED AND TOL WELL; RIGHT GROIN STABLE; NO BLEEDING OR HEMATOMA 

## 2011-12-26 NOTE — Op Note (Signed)
[facility]  Patient name: Nathan [name] MRN: [id number] DOB: May 17, 1950 Sex: male  12/26/2011 Pre-operative Diagnosis: Rule out in-stent stenosis, left leg Post-operative diagnosis:  Same Surgeon:  Jorge Ny Procedure Performed:  1.  ultrasound access left femoral artery (antegrade)  2.  left lower extremity angiogram    Indications:  The patient has a history of left popliteal artery stenting with a Cordis 7 x 30 self-expanding stent. Ultrasound has identified a high-grade stenosis. He comes in today for further evaluation.  Procedure:  I evaluated the right femoral artery and could not feel a pulse. By ultrasound I did not see pulsatile flow and therefore I decided to proceed with antegrade access. Ultrasound was used to evaluate the left common femoral artery it was widely patent. One percent lidocaine was used for local anesthesia. A digital ultrasound image was acquired. The left common femoral artery was then accessed with a micropuncture needle under ultrasound guidance. The 018 wire was advanced and a micropuncture sheath was placed. Over a Teena Dunk wire a 5 French sheath was inserted. The wire and sheath were in the profunda femoral artery and therefore I withdrew the sheath using a Kumpe catheter and Glidewire I directed the wire into the superficial femoral artery and then advanced the sheath over the catheter. Left lower extremity runoff was performed.  Findings:     Left Lower Extremity:  The left common femoral and profunda femoral artery are widely patent. The left superficial femoral artery is widely patent. The left popliteal artery is widely patent without evidence of stenosis. The anterior tibial artery has a high-grade stenosis at its origin. The tibioperoneal trunk is patent but diffusely diseased. The posterior tibial and peroneal artery are occluded. The anterior tibial artery occludes at the ankle. There is reconstitution of the  peroneal artery in the midcalf. This then fills the posterior tibial artery at the ankle which provides flow out to the foot.  Intervention:  None  Impression:  #1  widely patent popliteal stent  #2  stenosis at the origin of the anterior tibial artery  #3  reconstitution of the peroneal artery which then fills the posterior tibial artery out onto the foot   V. Durene Cal, M.D. [facility] [place] Office: [phone] Pager:  [phone]

## 2011-12-27 ENCOUNTER — Telehealth: Payer: Self-pay | Admitting: Surgery

## 2011-12-27 NOTE — Telephone Encounter (Signed)
Message copied by Fredrich Birks on Wed Dec 27, 2011  9:54 AM ------      Message from: Melene Plan      Created: Tue Dec 26, 2011  3:55 PM                   ----- Message -----         From: Nada Libman, MD         Sent: 12/26/2011   2:44 PM           To: Reuel Derby, Melene Plan, RN            12/26/2011, the patient had the following procedures:                                           1.  ultrasound access left femoral artery (antegrade)       2.  left lower extremity angiogram                   Please have the patient come back to see rusty in 3 months with a duplex ultrasound and ABIs

## 2011-12-27 NOTE — Telephone Encounter (Signed)
Spoke with pt to notify of appt and I also sent an appt letter, dpm

## 2011-12-28 NOTE — Interval H&P Note (Signed)
History and Physical Interval Note:  12/28/2011 10:13 AM  Nathan [name]  has presented today for surgery, with the diagnosis of claudication  The various methods of treatment have been discussed with the patient and family. After consideration of risks, benefits and other options for treatment, the patient has consented to  Procedure(s) (LRB): LOWER EXTREMITY ANGIOGRAM (Left) as a surgical intervention .  The patient's history has been reviewed, patient examined, no change in status, stable for surgery.  I have reviewed the patient's chart and labs.  Questions were answered to the patient's satisfaction.     Adama Ferber IV, VAnner Crete  I agree with the above H&P.   Durene Cal

## 2011-12-28 NOTE — H&P (View-Only) (Signed)
[facility] [place] Carotid Office Note  CC: Annual PVD and carotid surveillance Referring Physician: Brabham  History of Present Illness: 61-year-old male patient of Dr. Brabham is status post a right AKA as well as a left popliteal stent, the left popliteal stent was in 2010. The patient denies any claudication, rest pain or open ulcerations. The patient denies any signs or symptoms of CVA, TIA, amaurosis fugax or neural deficit. The patient denies any new medical diagnoses or recent surgeries.  Past Medical History  Diagnosis Date  . Diabetes mellitus   . HIV infection     undetectable viral load and CD4 ct 667 as of 11/2011  . Colon polyps     noted previous colonoscopy [facility]  . Hyperlipidemia   . Multiple sclerosis     in remission as of 11/2011 (diagnosed late 1980s)  . GERD (gastroesophageal reflux disease)   . Hypertension   . MRSA (methicillin resistant Staphylococcus aureus)   . PCP (pneumocystis jiroveci pneumonia)     2002  . History of syphilis     noted [facility] records  . Asthma     per 2003 [facility] pulm records pfts   . DDD (degenerative disc disease)     cervical spine  . PVD (peripheral vascular disease)     Left Stent 07/02/2008    ROS: [x] Positive   [ ] Denies    General: [ ] Weight loss, [ ] Fever, [ ] chills Neurologic: [ ] Dizziness, [ ] Blackouts, [ ] Seizure [ ] Stroke, [ ] "Mini stroke", [ ] Slurred speech, [ ] Temporary blindness; [ ] weakness in arms or legs, [ ] Hoarseness Cardiac: [ ] Chest pain/pressure, [ ] Shortness of breath at rest [ ] Shortness of breath with exertion, [ ] Atrial fibrillation or irregular heartbeat Vascular: [ ] Pain in legs with walking, [ ] Pain in legs at rest, [ ] Pain in legs at night,  [ ] Non-healing ulcer, [ ] Blood clot in vein/DVT,   Pulmonary: [ ] Home oxygen, [ ] Productive cough, [ ] Coughing up blood, [ ] Asthma,  [ ] Wheezing Musculoskeletal:  [ ] Arthritis, [ ] Low back pain, [ ] Joint  pain Hematologic: [ ] Easy Bruising, [ ] Anemia; [ ] Hepatitis Gastrointestinal: [ ] Blood in stool, [ ] Gastroesophageal Reflux/heartburn, [ ] Trouble swallowing Urinary: [ ] chronic Kidney disease, [ ] on HD - [ ] MWF or [ ] TTHS, [ ] Burning with urination, [ ] Difficulty urinating Skin: [ ] Rashes, [ ] Wounds Psychological: [ ] Anxiety, [ ] Depression   Social History History  Substance Use Topics  . Smoking status: Former Smoker  . Smokeless tobacco: Not on file  . Alcohol Use: Yes    Family History Family History  Problem Relation Age of Onset  . Diabetes Mother   . Cancer Brother     colon caner stage 4 as of 11/2011 (unknown age of onset)    Allergies  Allergen Reactions  . Sulfonamide Derivatives     hives    Current Outpatient Prescriptions  Medication Sig Dispense Refill  . atorvastatin (LIPITOR) 40 MG tablet Take 1 tablet (40 mg total) by mouth daily.  30 tablet  5  . Blood Glucose Monitoring Suppl (BAYER CONTOUR MONITOR) W/DEVICE KIT Use to check blood sugar as instructed up to 4 times a day  1 kit  0  . clopidogrel (PLAVIX) 75 MG tablet Take   1 tablet (75 mg total) by mouth daily.  30 tablet  5  . glucose blood (BAYER CONTOUR NEXT TEST) test strip Four times daily before meals and at bedtime  100 each  11  . insulin aspart (NOVOLOG FLEXPEN) 100 UNIT/ML injection Inject subcutaneously 10 units before breakfast, 22 units before lunch, 16 units before supper  15 mL  12  . insulin glargine (LANTUS SOLOSTAR) 100 UNIT/ML injection Inject 64 Units into the skin daily.  30 mL  12  . Insulin Pen Needle (PEN NEEDLES 3/16") 31G X 5 MM MISC 1 application by Does not apply route 4 (four) times daily. Use to inject insulin twice daily Dx code 250.00  120 each  11  . lamiVUDine-zidovudine (COMBIVIR) 150-300 MG per tablet Take 1 tablet by mouth 2 (two) times daily.  60 tablet  5  . Lancets MISC 30 Units by Does not apply route 3 (three) times daily.  100 each  6  . lisinopril  (PRINIVIL,ZESTRIL) 40 MG tablet TAKE 1 TABLET BY MOUTH EVERY DAY  30 tablet  PRN  . metFORMIN (GLUCOPHAGE) 500 MG tablet TAKE 2 TABLETS BY MOUTH TWICE A DAY  120 tablet  PRN  . omeprazole (PRILOSEC) 40 MG capsule TAKE 1 CAPSULE BY MOUTH EVERY DAY  30 capsule  11  . Oxycodone HCl 10 MG TABS Take 1 tablet (10 mg total) by mouth 3 (three) times daily as needed.  90 tablet  0  . raltegravir (ISENTRESS) 400 MG tablet Take 1 tablet (400 mg total) by mouth 2 (two) times daily.  60 tablet  5  . tenofovir (VIREAD) 300 MG tablet Take 1 tablet (300 mg total) by mouth daily.  30 tablet  5    Physical Examination  Filed Vitals:   12/11/11 1515  BP: 119/73  Pulse: 69  Resp:     Body mass index is 22.92 kg/(m^2).  General:  WDWN in NAD Gait: Normal HEENT: WNL Eyes: Pupils equal Pulmonary: normal non-labored breathing , without Rales, rhonchi,  wheezing Cardiac: RRR, without  Murmurs, rubs or gallops; Abdomen: soft, NT, no masses Skin: no rashes, ulcers noted  Vascular Exam Pulses: 3+ radial pulses bilaterally, palpable left lower extremity pulses Carotid bruits: Carotid pulses to auscultation no bruits are Extremities without ischemic changes, no Gangrene , no cellulitis; no open wounds;  Musculoskeletal: no muscle wasting or atrophy   Neurologic: A&O X 3; Appropriate Affect ; SENSATION: normal; MOTOR FUNCTION:  moving all extremities equally. Speech is fluent/normal  Non-Invasive Vascular Imaging CAROTID DUPLEX 12/11/2011  Right ICA 40 - 59 % stenosis Left ICA 40 - 59 % stenosis Lower stream the duplex shows a patent popliteal stent with some elevation at the distal segment of 467 cm a second, ABI is 0.91 and triphasic on the left, this was reviewed with Dr. Brabham who has requested a left lower extremity arteriogram with possible intervention to investigate the elevated velocity.  ASSESSMENT/PLAN: Asymptomatic carotid stenosis, asymptomatic left lower stream in the PVD with elevation in  distal segment of popliteal stent. The patient will undergo a arteriogram with possible intervention left lower extremity with Dr. Brabham in the next week to 2 weeks, his followup-year-old be pending that procedure, he will followup in one year with repeat carotid duplex. The patient's questions were encouraged and answered, he is in agreement with this plan.  Vernice Bowker ANP   Clinic MD: Brabham 

## 2012-01-02 ENCOUNTER — Ambulatory Visit: Payer: Medicare Other | Admitting: Gastroenterology

## 2012-01-02 NOTE — Addendum Note (Signed)
Addended by: Neomia Dear on: 01/02/2012 05:53 PM   Modules accepted: Orders

## 2012-01-02 NOTE — Addendum Note (Signed)
Addended by: Neomia Dear on: 01/02/2012 06:17 PM   Modules accepted: Orders

## 2012-01-03 ENCOUNTER — Other Ambulatory Visit: Payer: Self-pay | Admitting: Infectious Disease

## 2012-01-18 ENCOUNTER — Encounter: Payer: Self-pay | Admitting: Gastroenterology

## 2012-01-18 ENCOUNTER — Ambulatory Visit (INDEPENDENT_AMBULATORY_CARE_PROVIDER_SITE_OTHER): Payer: Medicare Other | Admitting: Internal Medicine

## 2012-01-18 ENCOUNTER — Encounter: Payer: Self-pay | Admitting: Internal Medicine

## 2012-01-18 VITALS — BP 109/66 | HR 76 | Temp 97.1°F | Ht 73.0 in | Wt 174.6 lb

## 2012-01-18 DIAGNOSIS — I10 Essential (primary) hypertension: Secondary | ICD-10-CM

## 2012-01-18 DIAGNOSIS — D126 Benign neoplasm of colon, unspecified: Secondary | ICD-10-CM | POA: Diagnosis not present

## 2012-01-18 DIAGNOSIS — E119 Type 2 diabetes mellitus without complications: Secondary | ICD-10-CM | POA: Diagnosis not present

## 2012-01-18 DIAGNOSIS — IMO0001 Reserved for inherently not codable concepts without codable children: Secondary | ICD-10-CM | POA: Diagnosis not present

## 2012-01-18 DIAGNOSIS — I6529 Occlusion and stenosis of unspecified carotid artery: Secondary | ICD-10-CM | POA: Diagnosis not present

## 2012-01-18 DIAGNOSIS — Z0000 Encounter for general adult medical examination without abnormal findings: Secondary | ICD-10-CM

## 2012-01-18 DIAGNOSIS — IMO0002 Reserved for concepts with insufficient information to code with codable children: Secondary | ICD-10-CM

## 2012-01-18 DIAGNOSIS — Z23 Encounter for immunization: Secondary | ICD-10-CM | POA: Diagnosis not present

## 2012-01-18 DIAGNOSIS — E785 Hyperlipidemia, unspecified: Secondary | ICD-10-CM

## 2012-01-18 DIAGNOSIS — I739 Peripheral vascular disease, unspecified: Secondary | ICD-10-CM | POA: Diagnosis not present

## 2012-01-18 DIAGNOSIS — B20 Human immunodeficiency virus [HIV] disease: Secondary | ICD-10-CM

## 2012-01-18 LAB — GLUCOSE, CAPILLARY: Glucose-Capillary: 249 mg/dL — ABNORMAL HIGH (ref 70–99)

## 2012-01-18 LAB — POCT GLYCOSYLATED HEMOGLOBIN (HGB A1C): Hemoglobin A1C: 7.5

## 2012-01-18 MED ORDER — GLUCOSE BLOOD VI STRP: ORAL_STRIP | Status: DC

## 2012-01-18 NOTE — Assessment & Plan Note (Signed)
>>  ASSESSMENT AND PLAN FOR OCCLUSION AND STENOSIS OF CAROTID ARTERY WITHOUT MENTION OF CEREBRAL INFARCTION WRITTEN ON 01/18/2012  4:03 PM BY MCLEAN, TRACY N  B/l carotid artery stenosis 40-59% noted 12/2011 Pt is not sx'matic No further tx. Cont statin

## 2012-01-18 NOTE — Assessment & Plan Note (Signed)
H/o polyps and FH colon cancer Referred for colonoscopy 02/16/12 at 8:45 am

## 2012-01-18 NOTE — Assessment & Plan Note (Signed)
B/l carotid artery stenosis 40-59% noted 12/2011 Pt is not sx'matic No further tx. Cont statin

## 2012-01-18 NOTE — Assessment & Plan Note (Signed)
Controlled  Cont meds Combivir, Isentress, Viread Follows with Dr. Daiva Eves

## 2012-01-18 NOTE — Assessment & Plan Note (Signed)
BP 109/66, controlled  Continue Lisinopril 40 mg qd

## 2012-01-18 NOTE — Assessment & Plan Note (Addendum)
Lipid Panel     Component Value Date/Time   CHOL 125 09/21/2011 0953   TRIG 141 09/21/2011 0953   HDL 32* 09/21/2011 0953   CHOLHDL 3.9 09/21/2011 0953   VLDL 28 09/21/2011 0953   LDLCALC 65 09/21/2011 0953   Cont. Lipitor

## 2012-01-18 NOTE — Assessment & Plan Note (Addendum)
Will continue Novolog 04/28/17 units Continue Lantus 65 units Continue Metformin 1000 mg bid  HA1C improved from 8/4% to 7.5% today with fsbs 249  No hypoglycemic episodes or evidence of hypoglycemia per meter reading Pt needs to reschedule eye MD (Dr. Dione Booze) appt in the near future  Advised not to skip meals  Checked urine alb/Cr ratio today F/u 3-4 months

## 2012-01-18 NOTE — Assessment & Plan Note (Signed)
GI appt 02/16/12 Flu shot given today

## 2012-01-18 NOTE — Assessment & Plan Note (Addendum)
Recently seen by Dr. Myra Gianotti [facility] who noted occluded posterior tibial artery with extensive collateralization proximally and distal to stent.  Disease progression since 12/05/10 He had a Arteriogram with [facility] in 12/2011 and no surgery as of yet F/u with [facility] in 2 months

## 2012-01-18 NOTE — Patient Instructions (Addendum)
Continue taking your medications as instructed  Follow up in 3-4 months

## 2012-01-18 NOTE — Progress Notes (Signed)
  Subjective:    Patient ID: Nathan [name], male    DOB: 1950/06/19, 61 y.o.   MRN: [id number]  HPI Comments: 61 y.o male PMH PVD, DM 2, HTN, HLD, colon polyps, GERD, Hep B core ab positive, HIV (cd4 667, viral load <40 in 09/2011), s/p R AKA with phatom limb pain, h/o MS. Patient presents for f/u for DM 2 after increase in Novolog last visit.  He reports he is still skipping meals.  He denies complaints today. Reports he has been to the vascular surgeon who found blockage in his left lower leg near his previous stent and in the arteries in his neck.  He was unable to make his colonoscopy referral d/t his recent f/u with [facility].      Review of Systems  Respiratory: Negative for shortness of breath.   Cardiovascular: Negative for chest pain.  Gastrointestinal: Negative for abdominal distention.       Denies ab pain, constipation, diarrhea  Genitourinary: Negative for difficulty urinating.       Objective:   Physical Exam  Nursing note and vitals reviewed. Constitutional: He is oriented to person, place, and time. Vital signs are normal. He appears well-developed and well-nourished. He is cooperative. No distress.       Pleasant   HENT:  Head: Normocephalic and atraumatic.  Mouth/Throat: Oropharynx is clear and moist and mucous membranes are normal. No oropharyngeal exudate.  Eyes: Conjunctivae normal are normal.  Cardiovascular: Normal rate, regular rhythm, S1 normal, S2 normal and normal heart sounds.   No murmur heard. Pulmonary/Chest: Effort normal and breath sounds normal. No respiratory distress. He has no wheezes.  Abdominal: Soft. Bowel sounds are normal. He exhibits no distension. There is no tenderness.  Musculoskeletal:       Legs:      S/p right AKA with prothesis intact  Neurological: He is alert and oriented to person, place, and time.  Skin: Skin is warm, dry and intact. He is not diaphoretic.  Psychiatric: He has a normal mood and affect. His speech is normal and behavior  is normal.          Assessment & Plan:  Follow up 3-4 months   INTERNAL MEDICINE TEACHING ATTENDING ADDENDUM - Lars Mage, MD: I personally saw and evaluated Mr Mckone in this clinic visit in conjunction with the resident, Dr. Shirlee Latch. I have discussed the patient's plan of care with Dr. Shirlee Latch during this visit. I have confirmed the physical exam findings and have read and agree with the clinic note including the plan.

## 2012-01-19 ENCOUNTER — Encounter: Payer: Self-pay | Admitting: Internal Medicine

## 2012-01-19 LAB — MICROALBUMIN / CREATININE URINE RATIO
Creatinine, Urine: 112.9 mg/dL
Microalb Creat Ratio: 5 mg/g (ref 0.0–30.0)
Microalb, Ur: 0.57 mg/dL (ref 0.00–1.89)

## 2012-01-19 LAB — BASIC METABOLIC PANEL WITH GFR
BUN: 10 mg/dL (ref 6–23)
Chloride: 103 mEq/L (ref 96–112)
GFR, Est African American: >89 mL/min
GFR, Est Non African American: 87 mL/min
Potassium: 4 mEq/L (ref 3.5–5.3)
Sodium: 137 mEq/L (ref 135–145)

## 2012-01-30 ENCOUNTER — Telehealth: Payer: Self-pay | Admitting: Dietician

## 2012-01-30 NOTE — Telephone Encounter (Signed)
Got strips for new meter that he doesn't have. CDE left new meter at front desk for pt to pick up.

## 2012-02-16 ENCOUNTER — Ambulatory Visit (INDEPENDENT_AMBULATORY_CARE_PROVIDER_SITE_OTHER): Payer: Medicare Other | Admitting: Gastroenterology

## 2012-02-16 ENCOUNTER — Encounter: Payer: Self-pay | Admitting: Gastroenterology

## 2012-02-16 ENCOUNTER — Other Ambulatory Visit: Payer: Self-pay | Admitting: *Deleted

## 2012-02-16 VITALS — BP 140/78 | HR 62 | Ht 73.0 in | Wt 174.0 lb

## 2012-02-16 DIAGNOSIS — Z800 Family history of malignant neoplasm of digestive organs: Secondary | ICD-10-CM | POA: Diagnosis not present

## 2012-02-16 DIAGNOSIS — Z86010 Personal history of colonic polyps: Secondary | ICD-10-CM

## 2012-02-16 DIAGNOSIS — B20 Human immunodeficiency virus [HIV] disease: Secondary | ICD-10-CM

## 2012-02-16 MED ORDER — PEG-KCL-NACL-NASULF-NA ASC-C 100 G PO SOLR: 1.0000 | Freq: Once | ORAL | Status: DC

## 2012-02-16 MED ORDER — LAMIVUDINE-ZIDOVUDINE 150-300 MG PO TABS: 1.0000 | ORAL_TABLET | Freq: Two times a day (BID) | ORAL | Status: DC

## 2012-02-16 MED ORDER — TENOFOVIR DISOPROXIL FUMARATE 300 MG PO TABS: 300.0000 mg | ORAL_TABLET | Freq: Every day | ORAL | Status: DC

## 2012-02-16 MED ORDER — RALTEGRAVIR POTASSIUM 400 MG PO TABS: 400.0000 mg | ORAL_TABLET | Freq: Two times a day (BID) | ORAL | Status: DC

## 2012-02-16 NOTE — Progress Notes (Signed)
HPI: This is a    very pleasant, HIV positive man whom I am meeting for the first time today.   Had colonoscopy [facility] many years ago (probably more than 10 years ago).  POlyps were found (unclear pathology per patient, 2 of them came back "precancerous."  No gi symptoms.  He is on plavix for blood clots to right leg, had to be amputated.  Dr. Durene Cal  HE has not stopped the plavix.  Brother has colon cancer.   Review of systems: Pertinent positive and negative review of systems were noted in the above HPI section. Complete review of systems was performed and was otherwise normal.    Past Medical History  Diagnosis Date  . Diabetes mellitus   . HIV infection     undetectable viral load and CD4 ct 667 as of 11/2011  . Colon polyps     noted previous colonoscopy [facility]  . Hyperlipidemia   . Multiple sclerosis     in remission as of 11/2011 (diagnosed late 1980s)  . GERD (gastroesophageal reflux disease)   . Hypertension   . MRSA (methicillin resistant Staphylococcus aureus)   . PCP (pneumocystis jiroveci pneumonia)     2002  . History of syphilis     noted [facility] records  . Asthma     per 2003 [facility] pulm records pfts   . DDD (degenerative disc disease)     cervical spine  . PVD (peripheral vascular disease)     Left Stent 07/02/2008  . Depression   . Hep C w/o coma, chronic   . Pneumonia   . UTI (urinary tract infection)     Past Surgical History  Procedure Date  . Other surgical history     right lower ext AKA with prothesis   . Other surgical history     left left with stents (Dr. Osie Cheeks)  . Other surgical history     2003 colonoscopy 5 mm polyp transverse colon; (2)63mm  polyps in rectum-hyperplastic  . Above knee leg amputation     Right Leg    Current Outpatient Prescriptions  Medication Sig Dispense Refill  . atorvastatin (LIPITOR) 40 MG tablet Take 1 tablet (40 mg total) by mouth daily.  30 tablet  5  . Blood Glucose Monitoring Suppl (BAYER  CONTOUR MONITOR) W/DEVICE KIT Use to check blood sugar as instructed up to 4 times a day  1 kit  0  . clopidogrel (PLAVIX) 75 MG tablet Take 1 tablet (75 mg total) by mouth daily.  30 tablet  5  . glucose blood (BAYER CONTOUR NEXT TEST) test strip Four times daily before meals and at bedtime  120 each  11  . insulin aspart (NOVOLOG FLEXPEN) 100 UNIT/ML injection Inject subcutaneously 10 units before breakfast, 22 units before lunch, 16 units before supper  15 mL  12  . insulin glargine (LANTUS) 100 UNIT/ML injection Inject 65 Units into the skin at bedtime.      . Insulin Pen Needle (PEN NEEDLES 3/16") 31G X 5 MM MISC 1 application by Does not apply route 4 (four) times daily. Use to inject insulin twice daily Dx code 250.00  120 each  11  . lamiVUDine-zidovudine (COMBIVIR) 150-300 MG per tablet Take 1 tablet by mouth 2 (two) times daily.  60 tablet  5  . Lancets MISC 30 Units by Does not apply route 3 (three) times daily.  100 each  6  . lisinopril (PRINIVIL,ZESTRIL) 40 MG tablet Take 40 mg by  mouth daily.      . metFORMIN (GLUCOPHAGE) 500 MG tablet Take 1,000 mg by mouth 2 (two) times daily with a meal.      . omeprazole (PRILOSEC) 40 MG capsule TAKE 1 CAPSULE BY MOUTH EVERY DAY  30 capsule  6  . raltegravir (ISENTRESS) 400 MG tablet Take 1 tablet (400 mg total) by mouth 2 (two) times daily.  60 tablet  5  . tenofovir (VIREAD) 300 MG tablet Take 1 tablet (300 mg total) by mouth daily.  30 tablet  5    Allergies as of 02/16/2012 - Review Complete 02/16/2012  Allergen Reaction Noted  . Sulfonamide derivatives  02/12/2006    Family History  Problem Relation Age of Onset  . Diabetes Mother   . Cancer Brother     colon caner stage 4 as of 11/2011 (unknown age of onset)    History   Social History  . Marital Status: Single    Spouse Name: N/A    Number of Children: N/A  . Years of Education: N/A   Occupational History  . Not on file.   Social History Main Topics  . Smoking status:  Former Games developer  . Smokeless tobacco: Not on file  . Alcohol Use: No  . Drug Use: No  . Sexually Active: Not on file   Other Topics Concern  . Not on file   Social History Narrative  . No narrative on file       Physical Exam: BP 140/78  Pulse 62  Ht 6\' 1"  (1.854 m)  Wt 174 lb (78.926 kg)  BMI 22.96 kg/m2 Constitutional: generally well-appearing Psychiatric: alert and oriented x3 Eyes: extraocular movements intact Mouth: oral pharynx moist, no lesions Neck: supple no lymphadenopathy Cardiovascular: heart regular rate and rhythm Lungs: clear to auscultation bilaterally Abdomen: soft, nontender, nondistended, no obvious ascites, no peritoneal signs, normal bowel sounds Extremities: no lower extremity edema bilaterally Skin: no lesions on visible extremities    Assessment and plan: 61 y.o. male with  elevated risk for colon cancer given family history of colon cancer, potential precancerous polyps in the past, on chronic blood thinner  He is at increased risk for procedural complications given his ongoing Plavix use. We will communicate with his prescribing physician about him holding the Plavix for 5 days prior to the colonoscopy. It would be safe for him to substitute aspirin instead if needed. We will plan for colonoscopy in the next several weeks.

## 2012-02-16 NOTE — Patient Instructions (Addendum)
We will get records from [facility] colonoscopy and any associated pathology reports. You will be set up for a colonoscopy for family history colon cancer, and history of polyps (unclear pathology). We will contact Dr. Myra Gianotti about holding your plavix for 5 days (could substitute ASA if needed).

## 2012-02-20 ENCOUNTER — Other Ambulatory Visit: Payer: Self-pay | Admitting: *Deleted

## 2012-02-20 ENCOUNTER — Telehealth: Payer: Self-pay

## 2012-02-20 DIAGNOSIS — B20 Human immunodeficiency virus [HIV] disease: Secondary | ICD-10-CM

## 2012-02-20 MED ORDER — TENOFOVIR DISOPROXIL FUMARATE 300 MG PO TABS: 300.0000 mg | ORAL_TABLET | Freq: Every day | ORAL | Status: DC

## 2012-02-20 MED ORDER — RALTEGRAVIR POTASSIUM 400 MG PO TABS: 400.0000 mg | ORAL_TABLET | Freq: Two times a day (BID) | ORAL | Status: DC

## 2012-02-20 MED ORDER — LAMIVUDINE-ZIDOVUDINE 150-300 MG PO TABS: 1.0000 | ORAL_TABLET | Freq: Two times a day (BID) | ORAL | Status: DC

## 2012-02-20 NOTE — Telephone Encounter (Signed)
Stop plavix 5-7 days prior to procedure. Restart when you feel safe. ----- Message ----- From: Donata Duff, CMA Sent: 02/20/2012 8:25 AM To: Nada Libman, MD Please respond ASAP  Pt has been notified

## 2012-02-20 NOTE — Telephone Encounter (Signed)
Letter resent to Dr Myra Gianotti

## 2012-02-20 NOTE — Telephone Encounter (Signed)
Message copied by Donata Duff on Tue Feb 20, 2012  7:53 AM ------      Message from: Donata Duff      Created: Fri Feb 16, 2012  9:11 AM       Waiting on anti coag response from Dr Myra Gianotti 02/28/12 Colon

## 2012-02-28 ENCOUNTER — Ambulatory Visit (AMBULATORY_SURGERY_CENTER): Payer: Medicare Other | Admitting: Gastroenterology

## 2012-02-28 ENCOUNTER — Encounter: Payer: Self-pay | Admitting: Gastroenterology

## 2012-02-28 VITALS — BP 115/76 | HR 67 | Temp 96.2°F | Resp 22 | Ht 73.0 in | Wt 174.0 lb

## 2012-02-28 DIAGNOSIS — D126 Benign neoplasm of colon, unspecified: Secondary | ICD-10-CM

## 2012-02-28 DIAGNOSIS — Z800 Family history of malignant neoplasm of digestive organs: Secondary | ICD-10-CM

## 2012-02-28 DIAGNOSIS — Z1211 Encounter for screening for malignant neoplasm of colon: Secondary | ICD-10-CM

## 2012-02-28 DIAGNOSIS — Z86010 Personal history of colonic polyps: Secondary | ICD-10-CM

## 2012-02-28 LAB — GLUCOSE, CAPILLARY: Glucose-Capillary: 107 mg/dL — ABNORMAL HIGH (ref 70–99)

## 2012-02-28 MED ORDER — SODIUM CHLORIDE 0.9 % IV SOLN: 500.0000 mL | INTRAVENOUS | Status: DC

## 2012-02-28 NOTE — Op Note (Signed)
[facility] [address] [place], [address]   COLONOSCOPY PROCEDURE REPORT  PATIENT: Nathan [name], Nathan [name]  MR#: [id number] BIRTHDATE: 10-15-1950 , 61  yrs. old GENDER: Male ENDOSCOPIST: Rachael Fee, MD REFERRED BY:  Desma Maxim, MD PROCEDURE DATE:  02/28/2012 PROCEDURE:   Colonoscopy with biopsy ASA CLASS:   Class III INDICATIONS:elevated risk screening. MEDICATIONS: Fentanyl 75 mcg IV and Versed 8 mg IV  DESCRIPTION OF PROCEDURE:   After the risks benefits and alternatives of the procedure were thoroughly explained, informed consent was obtained.  A digital rectal exam revealed no abnormalities of the rectum.   The LB [id number]  endoscope was introduced through the anus and advanced to the cecum, which was identified by both the appendix and ileocecal valve. No adverse events experienced.   The quality of the prep was good, using MoviPrep  The instrument was then slowly withdrawn as the colon was fully examined.   COLON FINDINGS: There was a single small sessile polyp in desceding colon that was removed with forceps and sent to pathology (jar 1). The examination was otherwise normal.  Retroflexed views revealed no abnormalities. The time to cecum=3 minutes 03 seconds. Withdrawal time=8 minutes 26 seconds.  The scope was withdrawn and the procedure completed. COMPLICATIONS: There were no complications.  ENDOSCOPIC IMPRESSION: There was a single small polyp; removed and sent to pathology The examination was otherwise normal.  RECOMMENDATIONS: Given your significant family history of colon cancer, you should have a repeat colonoscopy in 5 years even if the polyp removed today is NOT precancerous. You can restart plavix today.   eSigned:  Rachael Fee, MD 02/28/2012 11:05 AM

## 2012-02-28 NOTE — Patient Instructions (Addendum)
YOU HAD AN ENDOSCOPIC PROCEDURE TODAY AT THE [facility]: Refer to the procedure report that was given to you for any specific questions about what was found during the examination.  If the procedure report does not answer your questions, please call your gastroenterologist to clarify.  If you requested that your care partner not be given the details of your procedure findings, then the procedure report has been included in a sealed envelope for you to review at your convenience later.  YOU SHOULD EXPECT: Some feelings of bloating in the abdomen. Passage of more gas than usual.  Walking can help get rid of the air that was put into your GI tract during the procedure and reduce the bloating. If you had a lower endoscopy (such as a colonoscopy or flexible sigmoidoscopy) you may notice spotting of blood in your stool or on the toilet paper. If you underwent a bowel prep for your procedure, then you may not have a normal bowel movement for a few days.  DIET: Your first meal following the procedure should be a light meal and then it is ok to progress to your normal diet.  A half-sandwich or bowl of soup is an example of a good first meal.  Heavy or fried foods are harder to digest and may make you feel nauseous or bloated.  Likewise meals heavy in dairy and vegetables can cause extra gas to form and this can also increase the bloating.  Drink plenty of fluids but you should avoid alcoholic beverages for 24 hours.  ACTIVITY: Your care partner should take you home directly after the procedure.  You should plan to take it easy, moving slowly for the rest of the day.  You can resume normal activity the day after the procedure however you should NOT DRIVE or use heavy machinery for 24 hours (because of the sedation medicines used during the test).    SYMPTOMS TO REPORT IMMEDIATELY: A gastroenterologist can be reached at any hour.  During normal business hours, 8:30 AM to 5:00 PM Monday through Friday,  call [phone].  After hours and on weekends, please call the GI answering service at [phone] who will take a message and have the physician on call contact you.   Following lower endoscopy (colonoscopy or flexible sigmoidoscopy):  Excessive amounts of blood in the stool  Significant tenderness or worsening of abdominal pains  Swelling of the abdomen that is new, acute  Fever of 100F or higher  Following upper endoscopy (EGD)  Vomiting of blood or coffee ground material  New chest pain or pain under the shoulder blades  Painful or persistently difficult swallowing  New shortness of breath  Fever of 100F or higher  Black, tarry-looking stools  FOLLOW UP: If any biopsies were taken you will be contacted by phone or by letter within the next 1-3 weeks.  Call your gastroenterologist if you have not heard about the biopsies in 3 weeks.  Our staff will call the home number listed on your records the next business day following your procedure to check on you and address any questions or concerns that you may have at that time regarding the information given to you following your procedure. This is a courtesy call and so if there is no answer at the home number and we have not heard from you through the emergency physician on call, we will assume that you have returned to your regular daily activities without incident.  SIGNATURES/CONFIDENTIALITY: You and/or your care   partner have signed paperwork which will be entered into your electronic medical record.  These signatures attest to the fact that that the information above on your After Visit Summary has been reviewed and is understood.  Full responsibility of the confidentiality of this discharge information lies with you and/or your care-partner.   Handout on polyps 

## 2012-02-28 NOTE — Progress Notes (Signed)
Patient did not experience any of the following events: a burn prior to discharge; a fall within the facility; wrong site/side/patient/procedure/implant event; or a hospital transfer or hospital admission upon discharge from the facility. (G8907) Patient did not have preoperative order for IV antibiotic SSI prophylaxis. (G8918)  

## 2012-02-29 ENCOUNTER — Telehealth: Payer: Self-pay | Admitting: *Deleted

## 2012-02-29 NOTE — Telephone Encounter (Signed)
  Follow up Call-  Call back number 02/28/2012  Post procedure Call Back phone  # [phone]  Permission to leave phone message Yes     Patient questions:  Do you have a fever, pain , or abdominal swelling? no Pain Score  0 *  Have you tolerated food without any problems? yes  Have you been able to return to your normal activities? yes  Do you have any questions about your discharge instructions: Diet   no Medications  no Follow up visit  no  Do you have questions or concerns about your Care? no  Actions: * If pain score is 4 or above: No action needed, pain <4.

## 2012-03-01 ENCOUNTER — Other Ambulatory Visit: Payer: Self-pay | Admitting: Internal Medicine

## 2012-03-01 ENCOUNTER — Other Ambulatory Visit: Payer: Self-pay | Admitting: *Deleted

## 2012-03-01 DIAGNOSIS — E119 Type 2 diabetes mellitus without complications: Secondary | ICD-10-CM

## 2012-03-01 MED ORDER — LANCETS MISC: Status: DC

## 2012-03-01 NOTE — Telephone Encounter (Signed)
Lancet rx faxed to [facility].

## 2012-03-05 ENCOUNTER — Encounter: Payer: Self-pay | Admitting: Gastroenterology

## 2012-03-18 ENCOUNTER — Encounter: Payer: Self-pay | Admitting: Internal Medicine

## 2012-04-01 ENCOUNTER — Ambulatory Visit: Payer: Medicare Other | Admitting: Surgery

## 2012-04-03 ENCOUNTER — Encounter: Payer: Self-pay | Admitting: Surgery

## 2012-04-08 ENCOUNTER — Encounter: Payer: Self-pay | Admitting: Surgery

## 2012-04-08 ENCOUNTER — Encounter (INDEPENDENT_AMBULATORY_CARE_PROVIDER_SITE_OTHER): Payer: Medicare Other | Admitting: *Deleted

## 2012-04-08 ENCOUNTER — Ambulatory Visit (INDEPENDENT_AMBULATORY_CARE_PROVIDER_SITE_OTHER): Payer: Medicare Other | Admitting: Surgery

## 2012-04-08 VITALS — BP 128/62 | HR 77 | Resp 16 | Ht 73.0 in | Wt 175.0 lb

## 2012-04-08 DIAGNOSIS — Z48812 Encounter for surgical aftercare following surgery on the circulatory system: Secondary | ICD-10-CM

## 2012-04-08 DIAGNOSIS — I70219 Atherosclerosis of native arteries of extremities with intermittent claudication, unspecified extremity: Secondary | ICD-10-CM | POA: Diagnosis not present

## 2012-04-08 NOTE — Progress Notes (Signed)
[facility]   Patient name: Nathan [name] MRN: [id number] DOB: May 16, 1950 Sex: male     Chief Complaint  Patient presents with  . PVD    3 month f/u LE angiogram    HISTORY OF PRESENT ILLNESS: The patient comes back today for followup. He is status post angiography in August of 2013. He has a history of left leg stenting. A 7 x 40 Cordis stent was placed on February of 2010 for left leg claudication. He has single vessel runoff via the anterior tibial artery. Ultrasound identified a high-grade in-stent stenosis. His angiography revealed that the stent was widely patent. There was narrowing at the ostium of the anterior tibial artery. No intervention was performed. He has no complaints today. He is ambulating with a right above-knee prosthesis.  Past Medical History  Diagnosis Date  . Diabetes mellitus   . HIV infection     undetectable viral load and CD4 ct 667 as of 11/2011  . Colon polyps     noted previous colonoscopy [facility]  . Hyperlipidemia   . Multiple sclerosis     in remission as of 11/2011 (diagnosed late 1980s)  . GERD (gastroesophageal reflux disease)   . Hypertension   . MRSA (methicillin resistant Staphylococcus aureus)   . PCP (pneumocystis jiroveci pneumonia)     2002  . History of syphilis     noted [facility] records  . Asthma     per 2003 [facility] pulm records pfts   . DDD (degenerative disc disease)     cervical spine  . PVD (peripheral vascular disease)     Left Stent 07/02/2008  . Depression   . Hep C w/o coma, chronic   . Pneumonia   . UTI (urinary tract infection)   . Clotting disorder   . Neuromuscular disorder     M.S.    Past Surgical History  Procedure Date  . Other surgical history     right lower ext AKA with prothesis   . Other surgical history     left left with stents (Dr. Osie Cheeks)  . Other surgical history     2003 colonoscopy 5 mm polyp transverse colon; (2)52mm  polyps in rectum-hyperplastic  . Above  knee leg amputation     Right Leg    History   Social History  . Marital Status: Single    Spouse Name: N/A    Number of Children: N/A  . Years of Education: N/A   Occupational History  . Not on file.   Social History Main Topics  . Smoking status: Former Games developer  . Smokeless tobacco: Never Used  . Alcohol Use: No  . Drug Use: No  . Sexually Active: Not on file   Other Topics Concern  . Not on file   Social History Narrative  . No narrative on file    Family History  Problem Relation Age of Onset  . Diabetes Mother   . Cancer Brother     colon caner stage 4 as of 11/2011 (unknown age of onset)    Allergies as of 04/08/2012 - Review Complete 04/08/2012  Allergen Reaction Noted  . Sulfonamide derivatives  02/12/2006    Current Outpatient Prescriptions on File Prior to Visit  Medication Sig Dispense Refill  . atorvastatin (LIPITOR) 40 MG tablet Take 1 tablet (40 mg total) by mouth daily.  30 tablet  5  . Blood Glucose Monitoring Suppl (BAYER CONTOUR MONITOR) W/DEVICE KIT Use to check blood sugar  as instructed up to 4 times a day  1 kit  0  . clopidogrel (PLAVIX) 75 MG tablet Take 1 tablet (75 mg total) by mouth daily.  30 tablet  5  . glucose blood (BAYER CONTOUR NEXT TEST) test strip Four times daily before meals and at bedtime  120 each  11  . insulin aspart (NOVOLOG FLEXPEN) 100 UNIT/ML injection Inject subcutaneously 10 units before breakfast, 22 units before lunch, 16 units before supper  15 mL  12  . insulin glargine (LANTUS) 100 UNIT/ML injection Inject 65 Units into the skin at bedtime.      . Insulin Pen Needle (PEN NEEDLES 3/16") 31G X 5 MM MISC 1 application by Does not apply route 4 (four) times daily. Use to inject insulin twice daily Dx code 250.00  120 each  11  . lamiVUDine-zidovudine (COMBIVIR) 150-300 MG per tablet Take 1 tablet by mouth 2 (two) times daily.  60 tablet  5  . Lancets MISC Use to Test Blood Sugar Three Times Daily. Dx Code: 250.02  100  each  3  . lisinopril (PRINIVIL,ZESTRIL) 40 MG tablet Take 40 mg by mouth daily.      . metFORMIN (GLUCOPHAGE) 500 MG tablet Take 1,000 mg by mouth 2 (two) times daily with a meal.      . omeprazole (PRILOSEC) 40 MG capsule TAKE 1 CAPSULE BY MOUTH EVERY DAY  30 capsule  6  . raltegravir (ISENTRESS) 400 MG tablet Take 1 tablet (400 mg total) by mouth 2 (two) times daily.  60 tablet  5  . tenofovir (VIREAD) 300 MG tablet Take 1 tablet (300 mg total) by mouth daily.  30 tablet  5  . traMADol (ULTRAM) 50 MG tablet TAKE 1 TABLET BY MOUTH EVERY 6 HOURS AS NEEDED FOR PAIN  90 tablet  PRN     REVIEW OF SYSTEMS: No changes from prior visit  PHYSICAL EXAMINATION:   Vital signs are BP 128/62  Pulse 77  Resp 16  Ht 6\' 1"  (1.854 m)  Wt 175 lb (79.379 kg)  BMI 23.09 kg/m2  SpO2 99% General: The patient appears their stated age. HEENT:  No gross abnormalities Pulmonary:  Non labored breathing right above-knee amputation Musculoskeletal:  Neurologic: No focal weakness or paresthesias are detected, Skin: There are no ulcer or rashes noted. Psychiatric: The patient has normal affect. Cardiovascular: There is a regular rate and rhythm without significant murmur appreciated.   Diagnostic Studies  duplex ultrasound was ordered and reviewed today. This shows an ABI of 0.8 slightly down from August. No evidence of stenosis is identified within the stent.   Assessment:  status post left popliteal/superficial femoral stenting.  Plan:  The patient will continue routine surveillance. The next a duplex will be in 6 months.  Jorge Ny, M.D. [facility] Office: [phone] Pager:  [phone]

## 2012-04-10 NOTE — Addendum Note (Signed)
Addended by: Sharee Pimple on: 04/10/2012 12:56 PM   Modules accepted: Orders

## 2012-04-25 ENCOUNTER — Encounter: Payer: Self-pay | Admitting: Internal Medicine

## 2012-04-25 ENCOUNTER — Ambulatory Visit (INDEPENDENT_AMBULATORY_CARE_PROVIDER_SITE_OTHER): Payer: Medicare Other | Admitting: Internal Medicine

## 2012-04-25 VITALS — BP 142/71 | HR 96 | Temp 97.2°F | Ht 73.0 in | Wt 177.5 lb

## 2012-04-25 DIAGNOSIS — IMO0002 Reserved for concepts with insufficient information to code with codable children: Secondary | ICD-10-CM | POA: Diagnosis not present

## 2012-04-25 DIAGNOSIS — L539 Erythematous condition, unspecified: Secondary | ICD-10-CM

## 2012-04-25 DIAGNOSIS — IMO0001 Reserved for inherently not codable concepts without codable children: Secondary | ICD-10-CM | POA: Diagnosis not present

## 2012-04-25 DIAGNOSIS — E119 Type 2 diabetes mellitus without complications: Secondary | ICD-10-CM

## 2012-04-25 LAB — POCT GLYCOSYLATED HEMOGLOBIN (HGB A1C): Hemoglobin A1C: 7.1

## 2012-04-25 NOTE — Patient Instructions (Addendum)
General Instructions:Please check your glucose level as soon as you get up in the morning. Try around 8 am.  Check three times a day Please increase your lunch time Novolog to 24 units.  Keep the rest the same    Treatment Goals:  Goals (1 Years of Data) as of 04/25/2012          04/25/12 04/25/12 04/08/12 02/28/12 02/28/12     Blood Pressure    . Blood Pressure < 140/90  142/71 131/71 128/62 115/76 111/49    . Blood Pressure < 140/90  142/71 131/71 128/62 115/76 111/49     Result Component    . HDL > 40          . HEMOGLOBIN A1C < 7.0  7.1        . HEMOGLOBIN A1C < 7.0  7.1        . LDL CALC < 100          . LDL CALC < 100            Progress Toward Treatment Goals:  Treatment Goal 04/25/2012  Hemoglobin A1C improved  Blood pressure at goal    Self Care Goals & Plans:  Self Care Goal 04/25/2012  Manage my medications take my medicines as prescribed; bring my medications to every visit; refill my medications on time; follow the sick day instructions if I am sick  Monitor my health keep track of my blood glucose; bring my glucose meter and log to each visit  Eat healthy foods drink diet soda or water instead of juice or soda; eat more vegetables; eat foods that are low in salt; eat fruit for snacks and desserts  Be physically active take a walk every day    Home Blood Glucose Monitoring 04/25/2012  Check my blood sugar 2 times a day  When to check my blood sugar before lunch; before dinner     Care Management & Community Referrals:  Referral 04/25/2012  Referrals made for care management support none needed  Referrals made to community resources (No Data)     Hypertension Hypertension is another name for high blood pressure. High blood pressure may mean that your heart needs to work harder to pump blood. Blood pressure consists of two numbers, which includes a higher number over a lower number (example: 110/72). HOME CARE   Make lifestyle changes as told by your  doctor. This may include weight loss and exercise.  Take your blood pressure medicine every day.  Limit how much salt you use.  Stop smoking if you smoke.  Do not use drugs.  Talk to your doctor if you are using decongestants or birth control pills. These medicines might make blood pressure higher.  Females should not drink more than 1 alcoholic drink per day. Males should not drink more than 2 alcoholic drinks per day.  See your doctor as told. GET HELP RIGHT AWAY IF:   You have a blood pressure reading with a top number of 180 or higher.  You get a very bad headache.  You get blurred or changing vision.  You feel confused.  You feel weak, numb, or faint.  You get chest or belly (abdominal) pain.  You throw up (vomit).  You cannot breathe very well. MAKE SURE YOU:   Understand these instructions.  Will watch your condition.  Will get help right away if you are not doing well or get worse. Document Released: 10/11/2007 Document Revised: 07/17/2011 Document Reviewed: 10/11/2007 [employer]  Patient Information 2013 [place], [place].       Diabetes, Type 2 Diabetes is a long-lasting (chronic) disease. In type 2 diabetes, the pancreas does not make enough insulin (a hormone), and the body does not respond normally to the insulin that is made. This type of diabetes was also previously called adult-onset diabetes. It usually occurs after the age of 79, but it can occur at any age.  CAUSES  Type 2 diabetes happens because the pancreasis not making enough insulin or your body has trouble using the insulin that your pancreas does make properly. SYMPTOMS   Drinking more than usual.  Urinating more than usual.  Blurred vision.  Dry, itchy skin.  Frequent infections.  Feeling more tired than usual (fatigue). DIAGNOSIS The diagnosis of type 2 diabetes is usually made by one of the following tests:  Fasting blood glucose test. You will not eat for at least 8 hours  and then take a blood test.  Random blood glucose test. Your blood glucose (sugar) is checked at any time of the day regardless of when you ate.  Oral glucose tolerance test (OGTT). Your blood glucose is measured after you have not eaten (fasted) and then after you drink a glucose containing beverage. TREATMENT   Healthy eating.  Exercise.  Medicine, if needed.  Monitoring blood glucose.  Seeing your caregiver regularly. HOME CARE INSTRUCTIONS   Check your blood glucose at least once a day. More frequent monitoring may be necessary, depending on your medicines and on how well your diabetes is controlled. Your caregiver will advise you.  Take your medicine as directed by your caregiver.  Do not smoke.  Make wise food choices. Ask your caregiver for information. Weight loss can improve your diabetes.  Learn about low blood glucose (hypoglycemia) and how to treat it.  Get your eyes checked regularly.  Have a yearly physical exam. Have your blood pressure checked and your blood and urine tested.  Wear a pendant or bracelet saying that you have diabetes.  Check your feet every night for cuts, sores, blisters, and redness. Let your caregiver know if you have any problems. SEEK MEDICAL CARE IF:   You have problems keeping your blood glucose in target range.  You have problems with your medicines.  You have symptoms of an illness that do not improve after 24 hours.  You have a sore or wound that is not healing.  You notice a change in vision or a new problem with your vision.  You have a fever. MAKE SURE YOU:  Understand these instructions.  Will watch your condition.  Will get help right away if you are not doing well or get worse. Document Released: 04/24/2005 Document Revised: 07/17/2011 Document Reviewed: 10/10/2010 [facility] Patient Information 2013 [place], [place].

## 2012-04-26 ENCOUNTER — Encounter: Payer: Self-pay | Admitting: Internal Medicine

## 2012-04-26 NOTE — Assessment & Plan Note (Addendum)
HA1C 7.5--7.1% with hyperglycemia noted at lunch and dinner 200s-300s, fsbs 314 today Continue Lantus 65 units qhs, 12 units Novolog breakfast, 22 units increasing to 24 units with lunch, and 18 units with dinner -patient to check tid glucose levels.  Check fasting cbg prior to eating breakfast -He will have eye MD appt Dr. Dione Booze 06/06/12 at 2:15 for annual eye exam.  -Wrote a letter and sent a log to his insurance company to approve more strips for his Contour EZ meter.

## 2012-04-26 NOTE — Progress Notes (Signed)
  Subjective:    Patient ID: Nathan [name], male    DOB: 1950-07-12, 61 y.o.   MRN: [id number]  HPI Comments: 61 y.o male significant PMH controlled HIV, colon polyps, MS, HTN (BP 131/71) etc.  He presents for DM 2 follow up.  He is taking Lantus 65 units qhs, Novolog 04/28/17 units.  He states he eats dessert at least qd if not bid.  His HA1C was 7.5 now 7.1. His cbg is 314 today and his DM meter is still showing elevations 200s-300s.  He wakes up and checks his glucose about 8:30 or 9.  He needs Rx refills for strips but his insurance will not cover unless they have documentation of his meter readings as to why he needs to check his glucose tid.  Overall he is feeling well.  He did not see Dr. Dione Booze again yet due to the last time he saw him he was not seen after waiting 3 hours.  He mentions that his skin is irritated near the groin where his prosthetic leg is rubbing.  He is taking care of it at home.  The area is red.  He has been applying gauze to the area.  He went to his prosthetic company but they advised him to visit with his PCP and it is nothing that can be done but surgery to remove excess skin he has after weight loss.  Due to weight loss from exercise his leg size is smaller than when he initially had the prosthetic leg.         Review of Systems  Constitutional: Negative for appetite change.  Respiratory: Negative for shortness of breath.   Cardiovascular: Negative for chest pain.  Gastrointestinal: Negative for abdominal pain.  Skin:       Minimal irritation right inner/groin leg        Objective:   Physical Exam  Nursing note and vitals reviewed. Constitutional: He is oriented to person, place, and time. Vital signs are normal. He appears well-developed and well-nourished. He is cooperative. No distress.       Pleasant   HENT:  Head: Normocephalic and atraumatic.  Mouth/Throat: Oropharynx is clear and moist and mucous membranes are normal. He has dentures. No oropharyngeal  exudate.  Eyes: Conjunctivae normal are normal. Pupils are equal, round, and reactive to light. Right eye exhibits no discharge. Left eye exhibits no discharge. No scleral icterus.  Cardiovascular: Normal rate, regular rhythm, S1 normal, S2 normal and normal heart sounds.   No murmur heard. Pulmonary/Chest: Effort normal and breath sounds normal. No respiratory distress. He has no wheezes.  Abdominal: Soft. Bowel sounds are normal. He exhibits no distension. There is no tenderness.  Neurological: He is alert and oriented to person, place, and time.       Mild limp with prosthetic leg   Skin: Skin is warm and dry. He is not diaphoretic. There is erythema.     Psychiatric: He has a normal mood and affect. His speech is normal and behavior is normal. Judgment and thought content normal. Cognition and memory are normal.          Assessment & Plan:  F/u 3 months

## 2012-04-27 ENCOUNTER — Other Ambulatory Visit: Payer: Self-pay | Admitting: Internal Medicine

## 2012-04-27 MED ORDER — GLUCOSE BLOOD VI STRP: ORAL_STRIP | Status: DC

## 2012-04-27 MED ORDER — INSULIN ASPART 100 UNIT/ML ~~LOC~~ SOLN: SUBCUTANEOUS | Status: DC

## 2012-04-27 NOTE — Assessment & Plan Note (Signed)
Controlled.  

## 2012-04-27 NOTE — Assessment & Plan Note (Signed)
He has lost weight due to exercise and his skin is loose and does not fit into prosthetic leg as it used to.  He went to the manufacturer of the prosthetic leg and they advised nothing to do follow up with PCP versus surgery to removed excess skin and prevent rubbing.   -Gave hime Restore hydrocolloids dressing (thicker dressing) which seemed to help take pressure/rubbing off the site in right inner thigh groin.  There was no evidence of breakdown other than erythema.

## 2012-04-27 NOTE — Addendum Note (Signed)
Addended by: Annett Gula on: 04/27/2012 12:08 PM   Modules accepted: Orders, Medications

## 2012-04-27 NOTE — Addendum Note (Signed)
Addended by: Annett Gula on: 04/27/2012 12:15 PM   Modules accepted: Orders

## 2012-05-06 ENCOUNTER — Other Ambulatory Visit: Payer: Self-pay | Admitting: *Deleted

## 2012-05-06 MED ORDER — GLUCOSE BLOOD VI STRP: ORAL_STRIP | Status: DC

## 2012-05-06 NOTE — Telephone Encounter (Signed)
Please print this for the pharmacy

## 2012-05-13 MED ORDER — GLUCOSE BLOOD VI STRP: ORAL_STRIP | Status: DC

## 2012-05-13 NOTE — Telephone Encounter (Signed)
Sent to wrong pharmacy.  Please reprint

## 2012-05-13 NOTE — Addendum Note (Signed)
Addended by: Blanch Media A on: 05/13/2012 11:44 AM   Modules accepted: Orders

## 2012-06-06 DIAGNOSIS — E119 Type 2 diabetes mellitus without complications: Secondary | ICD-10-CM | POA: Diagnosis not present

## 2012-06-06 DIAGNOSIS — H2510 Age-related nuclear cataract, unspecified eye: Secondary | ICD-10-CM | POA: Diagnosis not present

## 2012-06-24 ENCOUNTER — Other Ambulatory Visit: Payer: Self-pay | Admitting: Licensed Clinical Social Worker

## 2012-06-24 DIAGNOSIS — B20 Human immunodeficiency virus [HIV] disease: Secondary | ICD-10-CM

## 2012-06-24 DIAGNOSIS — E119 Type 2 diabetes mellitus without complications: Secondary | ICD-10-CM

## 2012-06-24 MED ORDER — RALTEGRAVIR POTASSIUM 400 MG PO TABS: 400.0000 mg | ORAL_TABLET | Freq: Two times a day (BID) | ORAL | Status: DC

## 2012-06-24 MED ORDER — TENOFOVIR DISOPROXIL FUMARATE 300 MG PO TABS: 300.0000 mg | ORAL_TABLET | Freq: Every day | ORAL | Status: DC

## 2012-06-24 MED ORDER — LAMIVUDINE-ZIDOVUDINE 150-300 MG PO TABS: 1.0000 | ORAL_TABLET | Freq: Two times a day (BID) | ORAL | Status: DC

## 2012-06-26 ENCOUNTER — Telehealth: Payer: Self-pay | Admitting: *Deleted

## 2012-06-26 ENCOUNTER — Other Ambulatory Visit: Payer: Self-pay | Admitting: *Deleted

## 2012-06-26 DIAGNOSIS — E119 Type 2 diabetes mellitus without complications: Secondary | ICD-10-CM

## 2012-06-26 DIAGNOSIS — I739 Peripheral vascular disease, unspecified: Secondary | ICD-10-CM

## 2012-06-26 DIAGNOSIS — E7800 Pure hypercholesterolemia, unspecified: Secondary | ICD-10-CM

## 2012-06-26 NOTE — Telephone Encounter (Signed)
Pt receiving Primary Care w/ Dr. Desma Maxim at [facility].  Will fax rx request to [facility] for refills.

## 2012-06-27 MED ORDER — INSULIN ASPART 100 UNIT/ML ~~LOC~~ SOLN: SUBCUTANEOUS | Status: DC

## 2012-06-27 MED ORDER — LISINOPRIL 40 MG PO TABS: 40.0000 mg | ORAL_TABLET | Freq: Every day | ORAL | Status: DC

## 2012-06-27 MED ORDER — ATORVASTATIN CALCIUM 40 MG PO TABS: 40.0000 mg | ORAL_TABLET | Freq: Every day | ORAL | Status: DC

## 2012-06-27 MED ORDER — OMEPRAZOLE 40 MG PO CPDR: 40.0000 mg | DELAYED_RELEASE_CAPSULE | Freq: Every day | ORAL | Status: DC

## 2012-06-27 MED ORDER — CLOPIDOGREL BISULFATE 75 MG PO TABS: 75.0000 mg | ORAL_TABLET | Freq: Every day | ORAL | Status: DC

## 2012-06-27 MED ORDER — INSULIN GLARGINE 100 UNIT/ML ~~LOC~~ SOLN: 65.0000 [IU] | Freq: Every day | SUBCUTANEOUS | Status: DC

## 2012-06-27 MED ORDER — METFORMIN HCL 500 MG PO TABS: 1000.0000 mg | ORAL_TABLET | Freq: Two times a day (BID) | ORAL | Status: DC

## 2012-07-09 ENCOUNTER — Other Ambulatory Visit (INDEPENDENT_AMBULATORY_CARE_PROVIDER_SITE_OTHER): Payer: Medicare Other

## 2012-07-09 DIAGNOSIS — B20 Human immunodeficiency virus [HIV] disease: Secondary | ICD-10-CM

## 2012-07-09 LAB — CBC WITH DIFFERENTIAL/PLATELET
Basophils Absolute: 0 10*3/uL (ref 0.0–0.1)
Eosinophils Absolute: 0.2 10*3/uL (ref 0.0–0.7)
Eosinophils Relative: 4 % (ref 0–5)
Lymphs Abs: 3.7 10*3/uL (ref 0.7–4.0)
MCH: 37.2 pg — ABNORMAL HIGH (ref 26.0–34.0)
Neutrophils Relative %: 27 % — ABNORMAL LOW (ref 43–77)
Platelets: 222 10*3/uL (ref 150–400)
RBC: 3.55 MIL/uL — ABNORMAL LOW (ref 4.22–5.81)
RDW: 13.3 % (ref 11.5–15.5)
WBC: 6.1 10*3/uL (ref 4.0–10.5)

## 2012-07-09 LAB — COMPLETE METABOLIC PANEL WITH GFR
ALT: 80 U/L — ABNORMAL HIGH (ref 0–53)
Albumin: 4.5 g/dL (ref 3.5–5.2)
BUN: 9 mg/dL (ref 6–23)
CO2: 23 mEq/L (ref 19–32)
Calcium: 9.3 mg/dL (ref 8.4–10.5)
Chloride: 102 mEq/L (ref 96–112)
Creat: 0.9 mg/dL (ref 0.50–1.35)
GFR, Est African American: >89 mL/min
Potassium: 3.9 mEq/L (ref 3.5–5.3)

## 2012-07-10 LAB — T-HELPER CELL (CD4) - (RCID CLINIC ONLY): CD4 T Cell Abs: 730 uL (ref 400–2700)

## 2012-07-18 ENCOUNTER — Encounter: Payer: Medicare Other | Admitting: Internal Medicine

## 2012-07-22 ENCOUNTER — Encounter: Payer: Self-pay | Admitting: Infectious Disease

## 2012-07-22 ENCOUNTER — Ambulatory Visit (INDEPENDENT_AMBULATORY_CARE_PROVIDER_SITE_OTHER): Payer: Medicare Other | Admitting: Infectious Disease

## 2012-07-22 VITALS — BP 155/74 | HR 73 | Temp 97.3°F | Ht 73.0 in | Wt 174.0 lb

## 2012-07-22 DIAGNOSIS — R52 Pain, unspecified: Secondary | ICD-10-CM | POA: Diagnosis not present

## 2012-07-22 DIAGNOSIS — Z113 Encounter for screening for infections with a predominantly sexual mode of transmission: Secondary | ICD-10-CM

## 2012-07-22 DIAGNOSIS — B20 Human immunodeficiency virus [HIV] disease: Secondary | ICD-10-CM | POA: Diagnosis not present

## 2012-07-22 DIAGNOSIS — E785 Hyperlipidemia, unspecified: Secondary | ICD-10-CM | POA: Diagnosis not present

## 2012-07-22 MED ORDER — OXYCODONE HCL 10 MG PO TABS: 10.0000 mg | ORAL_TABLET | Freq: Two times a day (BID) | ORAL | Status: DC | PRN

## 2012-07-22 NOTE — Progress Notes (Signed)
  Subjective:    Patient ID: Nathan [name], male    DOB: 1950-09-28, 62 y.o.   MRN: [id number]  HPI   Nathan [name] is a 62 y.o. male who is doing superbly well on his  antiviral regimen, with twice daily combivir, isentress and once daily viread with undetectable viral load and health cd4 count.   In clinica I did not have time to review his past genotypes but later I reviewed his cumulative genotype and he had   PI mutations: I54V, I84V, L90M , L10I, A71V, NRTI, NNRTI muations K103N, Y188C  R211K, 184V  So it is possible we might be able to change him to a simpler ARV regimen.  He is otherwise doing very well today and without any complaints other than his phantom limb pain and neuropathy.  I agreed to give him oxycodone 10mg  bid #60.     Review of Systems  Constitutional: Negative for fever, chills, diaphoresis, activity change, appetite change, fatigue and unexpected weight change.  HENT: Negative for congestion, sore throat, rhinorrhea, sneezing, trouble swallowing and sinus pressure.   Eyes: Negative for photophobia and visual disturbance.  Respiratory: Negative for cough, chest tightness, shortness of breath, wheezing and stridor.   Cardiovascular: Negative for chest pain, palpitations and leg swelling.  Gastrointestinal: Negative for nausea, vomiting, abdominal pain, diarrhea, constipation, blood in stool, abdominal distention and anal bleeding.  Genitourinary: Negative for dysuria, hematuria, flank pain and difficulty urinating.  Musculoskeletal: Positive for arthralgias. Negative for myalgias, back pain, joint swelling and gait problem.  Skin: Negative for color change, pallor, rash and wound.  Neurological: Negative for dizziness, tremors, weakness and light-headedness.  Hematological: Negative for adenopathy. Does not bruise/bleed easily.  Psychiatric/Behavioral: Negative for behavioral problems, confusion, sleep disturbance, dysphoric mood, decreased concentration and  agitation.       Objective:   Physical Exam  Constitutional: He is oriented to person, place, and time. He appears well-developed and well-nourished. No distress.  HENT:  Head: Normocephalic and atraumatic.  Mouth/Throat: Oropharynx is clear and moist. No oropharyngeal exudate.  Eyes: Conjunctivae and EOM are normal. Pupils are equal, round, and reactive to light. No scleral icterus.  Neck: Normal range of motion. Neck supple. No JVD present.  Cardiovascular: Normal rate, regular rhythm and normal heart sounds.  Exam reveals no gallop and no friction rub.   No murmur heard. Pulmonary/Chest: Effort normal and breath sounds normal. No respiratory distress. He has no wheezes. He has no rales. He exhibits no tenderness.  Abdominal: He exhibits no distension and no mass. There is no tenderness. There is no rebound and no guarding.  Musculoskeletal: He exhibits no edema and no tenderness.  Lymphadenopathy:    He has no cervical adenopathy.  Neurological: He is alert and oriented to person, place, and time. He has normal reflexes. He exhibits normal muscle tone. Coordination normal.  Skin: Skin is warm and dry. He is not diaphoretic. No erythema. No pallor.  Psychiatric: He has a normal mood and affect. His behavior is normal. Judgment and thought content normal.          Assessment & Plan:   HIV: excellent control. Now that I have been able to review his genotypes I can propose change to for example STRIBILD once daily and bid AZT, or look at a less toxic 3rd active drug.  DM: followed by [facility]  Lipids: at goal

## 2012-07-24 ENCOUNTER — Other Ambulatory Visit: Payer: Self-pay | Admitting: *Deleted

## 2012-07-24 DIAGNOSIS — B20 Human immunodeficiency virus [HIV] disease: Secondary | ICD-10-CM

## 2012-07-24 DIAGNOSIS — E7800 Pure hypercholesterolemia, unspecified: Secondary | ICD-10-CM

## 2012-07-24 MED ORDER — LAMIVUDINE-ZIDOVUDINE 150-300 MG PO TABS: 1.0000 | ORAL_TABLET | Freq: Two times a day (BID) | ORAL | Status: DC

## 2012-07-24 MED ORDER — TENOFOVIR DISOPROXIL FUMARATE 300 MG PO TABS: 300.0000 mg | ORAL_TABLET | Freq: Every day | ORAL | Status: DC

## 2012-07-24 MED ORDER — RALTEGRAVIR POTASSIUM 400 MG PO TABS: 400.0000 mg | ORAL_TABLET | Freq: Two times a day (BID) | ORAL | Status: DC

## 2012-07-25 ENCOUNTER — Ambulatory Visit (INDEPENDENT_AMBULATORY_CARE_PROVIDER_SITE_OTHER): Payer: Medicare Other | Admitting: Internal Medicine

## 2012-07-25 ENCOUNTER — Encounter: Payer: Self-pay | Admitting: Internal Medicine

## 2012-07-25 ENCOUNTER — Other Ambulatory Visit: Payer: Self-pay | Admitting: Internal Medicine

## 2012-07-25 VITALS — BP 128/67 | HR 86 | Temp 97.5°F | Wt 177.2 lb

## 2012-07-25 DIAGNOSIS — IMO0001 Reserved for inherently not codable concepts without codable children: Secondary | ICD-10-CM | POA: Diagnosis not present

## 2012-07-25 DIAGNOSIS — L989 Disorder of the skin and subcutaneous tissue, unspecified: Secondary | ICD-10-CM | POA: Diagnosis not present

## 2012-07-25 DIAGNOSIS — E119 Type 2 diabetes mellitus without complications: Secondary | ICD-10-CM

## 2012-07-25 MED ORDER — INSULIN ASPART 100 UNIT/ML ~~LOC~~ SOLN: SUBCUTANEOUS | Status: DC

## 2012-07-25 MED ORDER — INSULIN LISPRO 100 UNIT/ML ~~LOC~~ SOLN: SUBCUTANEOUS | Status: DC

## 2012-07-25 NOTE — Assessment & Plan Note (Signed)
Left hand with possible AK versus verruca vulgaris.  Right hand with brown macules with crusting Patient has never seen a dermatologist and may benefit from referral for general skin check in the future

## 2012-07-25 NOTE — Patient Instructions (Addendum)
General Instructions: Please take 12 units with breakfast, 24 units with lunch, and 22 units with dinner  Please check your sugars three times before meals.   Follow with with me at my next available within 1 to 3 months.    Treatment Goals:  Goals (1 Years of Data) as of 07/25/12         07/25/12 07/22/12 04/25/12 04/25/12 04/08/12     Blood Pressure    . Blood Pressure < 140/90  128/67 155/74 142/71 131/71 128/62    . Blood Pressure < 140/90  128/67 155/74 142/71 131/71 128/62     Result Component    . HDL > 40          . HEMOGLOBIN A1C < 7.0  7.8  7.1      . HEMOGLOBIN A1C < 7.0  7.8  7.1      . HEMOGLOBIN A1C < 7.0  7.8  7.1      . LDL CALC < 100          . LDL CALC < 100            Progress Toward Treatment Goals:  Treatment Goal 07/25/2012  Hemoglobin A1C deteriorated  Blood pressure at goal    Self Care Goals & Plans:  Self Care Goal 07/25/2012  Manage my medications take my medicines as prescribed; bring my medications to every visit; refill my medications on time  Monitor my health keep track of my blood glucose; bring my glucose meter and log to each visit  Eat healthy foods eat foods that are low in salt; eat baked foods instead of fried foods; drink diet soda or water instead of juice or soda; eat more vegetables; eat fruit for snacks and desserts; eat smaller portions  Be physically active park at the far end of the parking lot    Home Blood Glucose Monitoring 07/25/2012  Check my blood sugar 3 times a day  When to check my blood sugar before breakfast; before lunch; before dinner     Care Management & Community Referrals:  Referral 07/25/2012  Referrals made for care management support none needed  Referrals made to community resources (No Data)          Diabetes, Type 2 Diabetes is a long-lasting (chronic) disease. In type 2 diabetes, the pancreas does not make enough insulin (a hormone), and the body does not respond normally to the insulin that is  made. This type of diabetes was also previously called adult-onset diabetes. It usually occurs after the age of 25, but it can occur at any age.  CAUSES  Type 2 diabetes happens because the pancreasis not making enough insulin or your body has trouble using the insulin that your pancreas does make properly. SYMPTOMS   Drinking more than usual.  Urinating more than usual.  Blurred vision.  Dry, itchy skin.  Frequent infections.  Feeling more tired than usual (fatigue). DIAGNOSIS The diagnosis of type 2 diabetes is usually made by one of the following tests:  Fasting blood glucose test. You will not eat for at least 8 hours and then take a blood test.  Random blood glucose test. Your blood glucose (sugar) is checked at any time of the day regardless of when you ate.  Oral glucose tolerance test (OGTT). Your blood glucose is measured after you have not eaten (fasted) and then after you drink a glucose containing beverage. TREATMENT   Healthy eating.  Exercise.  Medicine, if needed.  Monitoring blood glucose.  Seeing your caregiver regularly. HOME CARE INSTRUCTIONS   Check your blood glucose at least once a day. More frequent monitoring may be necessary, depending on your medicines and on how well your diabetes is controlled. Your caregiver will advise you.  Take your medicine as directed by your caregiver.  Do not smoke.  Make wise food choices. Ask your caregiver for information. Weight loss can improve your diabetes.  Learn about low blood glucose (hypoglycemia) and how to treat it.  Get your eyes checked regularly.  Have a yearly physical exam. Have your blood pressure checked and your blood and urine tested.  Wear a pendant or bracelet saying that you have diabetes.  Check your feet every night for cuts, sores, blisters, and redness. Let your caregiver know if you have any problems. SEEK MEDICAL CARE IF:   You have problems keeping your blood glucose in  target range.  You have problems with your medicines.  You have symptoms of an illness that do not improve after 24 hours.  You have a sore or wound that is not healing.  You notice a change in vision or a new problem with your vision.  You have a fever. MAKE SURE YOU:  Understand these instructions.  Will watch your condition.  Will get help right away if you are not doing well or get worse. Document Released: 04/24/2005 Document Revised: 07/17/2011 Document Reviewed: 10/10/2010 [facility] Patient Information 2013 [place], [place].  Diabetes, Eating Away From Home Sometimes, you might eat in a restaurant or have meals that are prepared by someone else. You can enjoy eating out. However, the portions in restaurants may be much larger than needed. Listed below are some ideas to help you choose foods that will keep your blood glucose (sugar) in better control.  TIPS FOR EATING OUT  Know your meal plan and how many carbohydrate servings you should have at each meal. You may wish to carry a copy of your meal plan in your purse or wallet. Learn the foods included in each food group.  Make a list of restaurants near you that offer healthy choices. Take a copy of the carry-out menus to see what they offer. Then, you can plan what you will order ahead of time.  Become familiar with serving sizes by practicing them at home using measuring cups and spoons. Once you learn to recognize portion sizes, you will be able to correctly estimate the amount of total carbohydrate you are allowed to eat at the restaurant. Ask for a takeout box if the portion is more than you should have. When your food comes, leave the amount you should have on the plate, and put the rest in the takeout box before you start eating.  Plan ahead if your mealtime will be different from usual. Check with your caregiver to find out how to time meals and medicine if you are taking insulin.  Avoid high-fat foods, such as fried  foods, cream sauces, high-fat salad dressings, or any added butter or margarine.  Do not be afraid to ask questions. Ask your server about the portion size, cooking methods, ingredients and if items can be substituted. Restaurants do not list all available items on the menu. You can ask for your main entree to be prepared using skim milk, oil instead of butter or margarine, and without gravy or sauces. Ask your waiter or waitress to serve salad dressings, gravy, sauces, margarine, and sour cream on the side. You can then add  the amount your meal plan suggests.  Add more vegetables whenever possible.  Avoid items that are labeled "jumbo," "giant," "deluxe," or "supersized."  You may want to split an entre with someone and order an extra side salad.  Watch for hidden calories in foods like croutons, bacon, or cheese.  Ask your server to take away the bread basket or chips from your table.  Order a dinner salad as an appetizer. You can eat most foods served in a restaurant. Some foods are better choices than others. Breads and Starches  Recommended: All kinds of bread (wheat, rye, white, oatmeal, [place], [place], raisin), hard or soft dinner rolls, frankfurter or hamburger buns, small bagels, small corn or whole-wheat flour tortillas.  Avoid: Frosted or glazed breads, butter rolls, egg or cheese breads, croissants, sweet rolls, pastries, coffee cake, glazed or frosted doughnuts, muffins. Crackers  Recommended: Animal crackers, graham, rye, saltine, oyster, and matzoth crackers. Bread sticks, melba toast, rusks, pretzels, popcorn (without fat), zwieback toast.  Avoid: High-fat snack crackers or chips. Buttered popcorn. Cereals  Recommended: Hot and cold cereals. Whole grains such as oatmeal or shredded wheat are good choices.  Avoid: Sugar-coated or granola type cereals. Potatoes/Pasta/Rice/Beans  Recommended: Order baked, boiled, or mashed potatoes, rice or noodles without added fat,  whole beans. Order gravies, butter, margarine, or sauces on the side so you can control the amount you add.  Avoid: Hash browns or fried potatoes. Potatoes, pasta, or rice prepared with cream or cheese sauce. Potato or pasta salads prepared with large amounts of dressing. Fried beans or fried rice. Vegetables  Recommended: Order steamed, baked, boiled, or stewed vegetables without sauces or extra fat. Ask that sauce be served on the side. If vegetables are not listed on the menu, ask what is available.  Avoid: Vegetables prepared with cream, butter, or cheese sauce. Fried vegetables. Salad Bars  Recommended: Many of the vegetables at a salad bar are considered "free." Use lemon juice, vinegar, or low-calorie salad dressing (fewer than 20 calories per serving) as "free" dressings for your salad. Look for salad bar ingredients that have no added fat or sugar such as tomatoes, lettuce, cucumbers, broccoli, carrots, onions, and mushrooms.  Avoid: Prepared salads with large amounts of dressing, such as coleslaw, caesar salad, macaroni salad, bean salad, or carrot salad. Fruit  Recommended: Eat fresh fruit or fresh fruit salad without added dressing. A salad bar often offers fresh fruit choices, but canned fruit at a restaurant is usually packed in sugar or syrup.  Avoid: Sweetened canned or frozen fruits, plain or sweetened fruit juice. Fruit salads with dressing, sour cream, or sugar added to them. Meat and Meat Substitutes  Recommended: Order broiled, baked, roasted, or grilled meat, poultry, or fish. Trim off all visible fat. Do not eat the skin of poultry. The size stated on the menu is the raw weight. Meat shrinks by  in cooking (for example, 4 oz raw equals 3 oz cooked meat).  Avoid: Deep-fat fried meat, poultry, or fish. Breaded meats. Eggs  Recommended: Order soft, hard-cooked, poached, or scrambled eggs. Omelets may be okay, depending on what ingredients are added. Egg substitutes are  also a good choice.  Avoid: Fried eggs, eggs prepared with cream or cheese sauce. Milk  Recommended: Order low-fat or fat-free milk according to your meal plan. Plain, nonfat yogurt or flavored yogurt with no sugar added may be used as a substitute for milk. Soy milk may also be used.  Avoid: Milk shakes or sweetened milk beverages.  Soups and Combination Foods  Recommended: Clear broth or consomm are "free" foods and may be used as an appetizer. Broth-based soups with fat removed count as a starch serving and are preferred over cream soups. Soups made with beans or split peas may be eaten but count as a starch.  Avoid: Fatty soups, soup made with cream, cheese soup. Combination foods prepared with excessive amounts of fat or with cream or cheese sauces. Desserts and Sweets  Recommended: Ask for fresh fruit. Sponge or angel food cake without icing, ice milk, no sugar added ice cream, sherbet, or frozen yogurt may fit into your meal plan occasionally.  Avoid: Pastries, puddings, pies, cakes with icing, custard, gelatin desserts. Fats and Oils  Recommended: Choose healthy fats such as olive oil, canola oil, or tub margarine, reduced fat or fat-free sour cream, cream cheese, avocado, or nuts.  Avoid: Any fats in excess of your allowed portion. Deep-fried foods or any food with a large amount of fat. Note: Ask for all fats to be served on the side, and limit your portion sizes according to your meal plan. Document Released: 04/24/2005 Document Revised: 07/17/2011 Document Reviewed: 11/12/2008 [facility] Patient Information 2013 [place], [place].

## 2012-07-25 NOTE — Progress Notes (Addendum)
Subjective:    Patient ID: Nathan [name], male    DOB: February 12, 1951, 62 y.o.   MRN: [id number]  HPI Comments: 62 y.o PMH DM 2 (HA1C 7.1-->7.8 (avg blood sugar 177);today with cbg 289; normal recent diabetic eye exam), HIV (cd4 730, viral load <20), dyslipidemia, PVD, HTN (BP controlled today 128/67).   He presents for 3 month f/u for DM.  His fsbs per meter readings are elevated in the early am prior to breakfast 200s-300s, lunch 200-300s, and dinner 200s-400s.  He was taking novolog 12 units, 24 units 18 units then lantus 75 units qhs.  His blood glucose is elevated today 289 and his HA1C is increased since 3 months ago.  His is checking his glucose levels premeal and injecting his insulin in sites in addition to abdomen, arms, legs to vary site location.       Diabetes He presents for his follow-up diabetic visit. He has type 2 diabetes mellitus. His disease course has been worsening. There are no hypoglycemic associated symptoms. Pertinent negatives for diabetes include no blurred vision, no chest pain, no visual change and no weight loss. Pertinent negatives for diabetic complications include no retinopathy. Risk factors for coronary artery disease include diabetes mellitus, male sex and hypertension. Current diabetic treatment includes insulin injections and oral agent (monotherapy). He is compliant with treatment all of the time. He is currently taking insulin pre-breakfast, pre-lunch, pre-dinner and at bedtime. Insulin injections are given by patient. Rotation sites for injection include the abdominal wall, arms and lower legs. His weight is increasing steadily. He has not had a previous visit with a dietician. His home blood glucose trend is increasing steadily. His breakfast blood glucose is taken between 7-8 am. His breakfast blood glucose range is generally >200 mg/dl. His lunch blood glucose is taken between 2-3 pm. His lunch blood glucose range is generally >200 mg/dl. His dinner blood glucose  is taken between 7-8 pm. His dinner blood glucose range is generally >200 mg/dl. His highest blood glucose is >200 mg/dl. His overall blood glucose range is >200 mg/dl. An ACE inhibitor/angiotensin II receptor blocker is being taken. He does not see a podiatrist.Eye exam is current.      Review of Systems  Constitutional: Negative for weight loss and appetite change.  Eyes: Negative for blurred vision and visual disturbance.  Cardiovascular: Negative for chest pain.  Gastrointestinal: Negative for abdominal pain.  Skin:       Healing skin wound to right lower groin at site where prosthetic rubs skin.  No evidence of infection       Objective:   Physical Exam  Nursing note and vitals reviewed. Constitutional: He is oriented to person, place, and time. Vital signs are normal. He appears well-developed and well-nourished. He is cooperative.  HENT:  Head: Normocephalic and atraumatic.  Mouth/Throat: Oropharynx is clear and moist and mucous membranes are normal. He has dentures. No oropharyngeal exudate.  Eyes: Conjunctivae are normal. Pupils are equal, round, and reactive to light. Right eye exhibits no discharge. Left eye exhibits no discharge. No scleral icterus.  Cardiovascular: Normal rate, regular rhythm, S1 normal, S2 normal and normal heart sounds.   No murmur heard. Pulmonary/Chest: Effort normal and breath sounds normal.  Abdominal: Soft. Bowel sounds are normal. He exhibits no distension. There is no tenderness.  Musculoskeletal: He exhibits no edema.  Neurological: He is alert and oriented to person, place, and time. Gait abnormal.  Walks with limp due to prosthetic   Skin: Skin is  warm, dry and intact. No rash noted.     Psychiatric: He has a normal mood and affect. His speech is normal and behavior is normal. Judgment and thought content normal. Cognition and memory are normal.          Assessment & Plan:  1-3 months f/u with Shirlee Latch

## 2012-07-25 NOTE — Assessment & Plan Note (Addendum)
Slightly uncontrolled HA1C increased from 7.1 to now 7.8 with hyperglycemia throughout the day, highest at night Continue Novolog 12 units, 24 units, then increased 18 units with dinner to 22 units with dinner.  Continue Lantus 75 units  Patient may need diet education in the future with Gavin Pound as he likes to eat food and likes sweets. He had sweet tea and a brownie today Insurance is not going to cover [employer].  Will try to get prior authorization.   Advised patient to only give sq insulin in abdomen and not other locations which may affect absorption.  Patient to call within 1-2 weeks to let Dr. Shirlee Latch know if fsbs are decreasing and revisit in 1-3 months with Dr. Shirlee Latch for diabetes follow up.

## 2012-07-30 NOTE — Progress Notes (Signed)
Nathan [name] history, physical examination, and labs were discussed with Dr. Shirlee Latch and his assessment and plan were formulated together.  Interestingly his HgbA1C does not correlate with his blood sugar readings (A1C is better than expected).  He is taking his blood sugar measurements pre-meals.  Apparently, he is using arms and legs as injection sites.  This may be impacting the absorption of the insulin leading to these higher readings.  We encouraged him to be consistent with the site of his injections in the abdomen (at different sites in the abdomen).  Dietary discretion was advised.  We will reassess control of his diabetes at the next visit with these changes.

## 2012-08-02 ENCOUNTER — Telehealth: Payer: Self-pay | Admitting: *Deleted

## 2012-08-02 NOTE — Telephone Encounter (Signed)
Received faxed confirmation of insurance denial to pay for pt's Novolog Flexpen.  Insurance unable to approve this request for the following reasons: "There is no documentation that the participant has tried at least one formulary alternative: humalog".  Will forward info to pcp and place denial form in md's box which states how md and/or pt can submit an appeal.[place], Darlene Cassady3/28/20143:24 PM

## 2012-08-06 ENCOUNTER — Other Ambulatory Visit: Payer: Self-pay | Admitting: *Deleted

## 2012-08-06 MED ORDER — GLUCOSE BLOOD VI STRP: ORAL_STRIP | Status: DC

## 2012-08-07 NOTE — Telephone Encounter (Signed)
Rx called in to pharmacy. 

## 2012-08-09 ENCOUNTER — Ambulatory Visit (INDEPENDENT_AMBULATORY_CARE_PROVIDER_SITE_OTHER): Payer: Medicare Other | Admitting: *Deleted

## 2012-08-09 VITALS — BP 129/79 | HR 66 | Temp 97.6°F | Resp 16 | Ht 73.0 in | Wt 173.5 lb

## 2012-08-09 DIAGNOSIS — E7800 Pure hypercholesterolemia, unspecified: Secondary | ICD-10-CM

## 2012-08-09 DIAGNOSIS — Z206 Contact with and (suspected) exposure to human immunodeficiency virus [HIV]: Secondary | ICD-10-CM

## 2012-08-09 DIAGNOSIS — Z1159 Encounter for screening for other viral diseases: Secondary | ICD-10-CM

## 2012-08-09 DIAGNOSIS — Z21 Asymptomatic human immunodeficiency virus [HIV] infection status: Secondary | ICD-10-CM

## 2012-08-09 DIAGNOSIS — B20 Human immunodeficiency virus [HIV] disease: Secondary | ICD-10-CM

## 2012-08-09 LAB — LIPID PANEL
HDL: 30 mg/dL — ABNORMAL LOW (ref >39)
LDL Cholesterol: 85 mg/dL (ref 0–99)
Triglycerides: 207 mg/dL — ABNORMAL HIGH (ref <150)
VLDL: 41 mg/dL — ABNORMAL HIGH (ref 0–40)

## 2012-08-09 LAB — HEPATITIS C ANTIBODY: HCV Ab: NEGATIVE

## 2012-08-09 LAB — CD4/CD8 (T-HELPER/T-SUPPRESSOR CELL)
CD4%: 21.7
CD8 % Suppressor T Cell: 62.1
CD8: 1925

## 2012-08-09 LAB — COMPREHENSIVE METABOLIC PANEL
ALT: 59 U/L — ABNORMAL HIGH (ref 0–53)
AST: 47 U/L — ABNORMAL HIGH (ref 0–37)
Albumin: 4.9 g/dL (ref 3.5–5.2)
Alkaline Phosphatase: 62 U/L (ref 39–117)
Calcium: 9.6 mg/dL (ref 8.4–10.5)
Chloride: 101 mEq/L (ref 96–112)
Creat: 0.97 mg/dL (ref 0.50–1.35)
Potassium: 4.6 mEq/L (ref 3.5–5.3)

## 2012-08-09 LAB — HIV ANTIBODY (ROUTINE TESTING W REFLEX): HIV: REACTIVE

## 2012-08-09 LAB — HIV-1 RNA QUANT-NO REFLEX-BLD: HIV-1 RNA Viral Load: <40

## 2012-08-09 NOTE — Progress Notes (Signed)
  Subjective:    Patient ID: Nathan [name], male    DOB: [birth year], 62 y.o.   MRN: [id number]  HPI    Review of Systems  Constitutional: Negative.   HENT: Negative.   Eyes: Negative.   Respiratory: Negative.   Cardiovascular: Negative.   Gastrointestinal: Negative.   Genitourinary: Negative.   Musculoskeletal: Positive for arthralgias.  Skin: Positive for rash.  Neurological: Positive for numbness.  Psychiatric/Behavioral: Negative.        Objective:   Physical Exam  Constitutional: He is cooperative.  HENT:  Head: Normocephalic.  Mouth/Throat: Oropharynx is clear and moist and mucous membranes are normal. He has dentures. No oropharyngeal exudate.  Neck: No thyromegaly present.  Cardiovascular: Normal rate, regular rhythm and normal heart sounds.   Pulses:      Posterior tibial pulses are 1+ on the left side.  Pulmonary/Chest: Breath sounds normal. No apnea. No respiratory distress.  Abdominal: Soft. Normal appearance and bowel sounds are normal. There is no hepatomegaly. There is no tenderness.  Lymphadenopathy:       Head (right side): Posterior auricular adenopathy present.       Head (left side): Posterior auricular adenopathy present.    He has no cervical adenopathy.       Right: No supraclavicular adenopathy present.       Left: No supraclavicular adenopathy present.  Neurological: He is alert.  Skin: Skin is warm, dry and intact. Lesion noted.  Psychiatric: He has a normal mood and affect. His behavior is normal.          Assessment & Plan:  Patient here for screening for A5314, the methotrexate study. Informed consent obtained after review of the consent and discussion. He has a significant cardiovascular risk with his history of problems. He also has type 2 diabetes and takes insulin. His current problems include pain/cramping and numbness in his left foot/leg. He has a rt AKA. There are dry skin lesions on both hands that he will have evaluated by  dermatology next week.  If he is eligible for study after we review his labs drawn today, he will have a CXR done. He recently saw Dr. Daiva Eves a few weeks ago and his PCP, 2 weeks ago.

## 2012-08-11 LAB — HEPATITIS B CORE ANTIBODY, TOTAL: Hep B Core Total Ab: POSITIVE — AB

## 2012-08-12 ENCOUNTER — Other Ambulatory Visit: Payer: Self-pay | Admitting: *Deleted

## 2012-08-12 LAB — HEPATITIS B SURFACE ANTIBODY,QUALITATIVE: Hep B S Ab: REACTIVE — AB

## 2012-08-12 LAB — QUANTIFERON TB GOLD ASSAY (BLOOD)
Interferon Gamma Release Assay: NEGATIVE
Mitogen value: 9.2 IU/mL

## 2012-08-14 ENCOUNTER — Encounter: Payer: Self-pay | Admitting: *Deleted

## 2012-08-14 ENCOUNTER — Encounter: Payer: Self-pay | Admitting: Internal Medicine

## 2012-08-14 DIAGNOSIS — R269 Unspecified abnormalities of gait and mobility: Secondary | ICD-10-CM | POA: Insufficient documentation

## 2012-08-20 ENCOUNTER — Encounter: Payer: Self-pay | Admitting: Infectious Disease

## 2012-08-22 ENCOUNTER — Other Ambulatory Visit: Payer: Self-pay | Admitting: *Deleted

## 2012-08-22 DIAGNOSIS — IMO0001 Reserved for inherently not codable concepts without codable children: Secondary | ICD-10-CM

## 2012-08-22 MED ORDER — GLUCOSE BLOOD VI STRP: ORAL_STRIP | Status: DC

## 2012-08-22 NOTE — Telephone Encounter (Signed)
Fax from pharmacy - pt changed pharmacy - need new rx sent. Thanks

## 2012-08-27 ENCOUNTER — Telehealth: Payer: Self-pay | Admitting: *Deleted

## 2012-08-27 NOTE — Telephone Encounter (Signed)
Call to [facility] for Prior Authorization for Contour Next Test Strips-  Medical Necessity Forms need to be completed.  Forms to be faxed by [facility] for completion by Physician.  Angelina Ok, RN 08/27/2012 11:03 AM.

## 2012-09-03 ENCOUNTER — Ambulatory Visit (HOSPITAL_COMMUNITY)
Admission: RE | Admit: 2012-09-03 | Discharge: 2012-09-03 | Disposition: A | Discharge status: Home / Self Care | Payer: Medicare Other | Source: Ambulatory Visit | Attending: Infectious Disease | Admitting: Infectious Disease

## 2012-09-03 ENCOUNTER — Ambulatory Visit (INDEPENDENT_AMBULATORY_CARE_PROVIDER_SITE_OTHER): Payer: Medicare Other | Admitting: *Deleted

## 2012-09-03 DIAGNOSIS — Z21 Asymptomatic human immunodeficiency virus [HIV] infection status: Secondary | ICD-10-CM

## 2012-09-03 DIAGNOSIS — I10 Essential (primary) hypertension: Secondary | ICD-10-CM | POA: Insufficient documentation

## 2012-09-03 DIAGNOSIS — B20 Human immunodeficiency virus [HIV] disease: Secondary | ICD-10-CM

## 2012-09-03 DIAGNOSIS — B1910 Unspecified viral hepatitis B without hepatic coma: Secondary | ICD-10-CM

## 2012-09-03 DIAGNOSIS — E119 Type 2 diabetes mellitus without complications: Secondary | ICD-10-CM | POA: Insufficient documentation

## 2012-09-03 DIAGNOSIS — B1920 Unspecified viral hepatitis C without hepatic coma: Secondary | ICD-10-CM | POA: Insufficient documentation

## 2012-09-03 NOTE — Progress Notes (Signed)
Patient here to get lab drawn for research, Hep B dna PCR and  a CXR. Tentative entry for A5314 is planned for 5/15 as long as everything checks out okay. Planning to schedule the BART exam within the 3 days prior to entry.

## 2012-09-05 LAB — HEPATITIS B DNA, ULTRAQUANTITATIVE, PCR: Hepatitis B DNA: NOT DETECTED IU/mL (ref <20)

## 2012-09-11 ENCOUNTER — Ambulatory Visit (INDEPENDENT_AMBULATORY_CARE_PROVIDER_SITE_OTHER): Payer: Medicare Other | Admitting: *Deleted

## 2012-09-11 ENCOUNTER — Other Ambulatory Visit: Payer: Self-pay | Admitting: Infectious Disease

## 2012-09-11 DIAGNOSIS — E119 Type 2 diabetes mellitus without complications: Secondary | ICD-10-CM

## 2012-09-11 DIAGNOSIS — B20 Human immunodeficiency virus [HIV] disease: Secondary | ICD-10-CM

## 2012-09-11 DIAGNOSIS — Z21 Asymptomatic human immunodeficiency virus [HIV] infection status: Secondary | ICD-10-CM

## 2012-09-11 LAB — HEPATIC FUNCTION PANEL
ALT: 59 U/L — ABNORMAL HIGH (ref 0–53)
Bilirubin, Direct: 0.1 mg/dL (ref 0.0–0.3)
Indirect Bilirubin: 0.6 mg/dL (ref 0.0–0.9)

## 2012-09-11 NOTE — Addendum Note (Signed)
Addended by: Phill Myron on: 09/11/2012 04:48 PM   Modules accepted: Orders

## 2012-09-12 ENCOUNTER — Telehealth: Payer: Self-pay | Admitting: *Deleted

## 2012-09-12 NOTE — Telephone Encounter (Signed)
Call to pt to inform him of a Dermatology appointment.  Unable to leave a message on Pt's answering machine.  Letter mailed to pt with appointment with [facility]erm[facility] on 10/15/2012 Dr. Jorja Loa.  Angelina Ok, RN 09/12/2012 9:35 AM.

## 2012-09-18 ENCOUNTER — Encounter: Payer: Self-pay | Admitting: Infectious Disease

## 2012-09-19 ENCOUNTER — Ambulatory Visit (INDEPENDENT_AMBULATORY_CARE_PROVIDER_SITE_OTHER): Payer: Medicare Other | Admitting: *Deleted

## 2012-09-19 VITALS — BP 113/71 | HR 71 | Temp 97.4°F | Resp 16 | Ht 73.0 in | Wt 173.5 lb

## 2012-09-19 DIAGNOSIS — E119 Type 2 diabetes mellitus without complications: Secondary | ICD-10-CM

## 2012-09-19 DIAGNOSIS — B20 Human immunodeficiency virus [HIV] disease: Secondary | ICD-10-CM

## 2012-09-19 DIAGNOSIS — Z21 Asymptomatic human immunodeficiency virus [HIV] infection status: Secondary | ICD-10-CM

## 2012-09-19 LAB — CD4/CD8 (T-HELPER/T-SUPPRESSOR CELL)
CD4%: 23.6
CD4: 661
CD8: 1775

## 2012-09-19 LAB — COMPREHENSIVE METABOLIC PANEL
Albumin: 4.5 g/dL (ref 3.5–5.2)
CO2: 23 mEq/L (ref 19–32)
Calcium: 9.6 mg/dL (ref 8.4–10.5)
Glucose, Bld: 158 mg/dL — ABNORMAL HIGH (ref 70–99)
Potassium: 4.3 mEq/L (ref 3.5–5.3)
Sodium: 139 mEq/L (ref 135–145)
Total Bilirubin: 0.5 mg/dL (ref 0.3–1.2)
Total Protein: 7 g/dL (ref 6.0–8.3)

## 2012-09-19 LAB — GLUCOSE, RANDOM: Glucose, Bld: 158 mg/dL — ABNORMAL HIGH (ref 70–99)

## 2012-09-19 MED ORDER — METHOTREXATE SODIUM 5 MG PO TABS: 5.0000 mg | ORAL_TABLET | ORAL | Status: DC

## 2012-09-19 MED ORDER — FOLIC ACID 1 MG PO TABS: 1.0000 mg | ORAL_TABLET | Freq: Every day | ORAL | Status: DC

## 2012-09-19 NOTE — Addendum Note (Signed)
Addended by: Phill Myron on: 09/19/2012 11:39 AM   Modules accepted: Orders

## 2012-09-19 NOTE — Progress Notes (Signed)
Patient enrolled on [phone] today , the Methotrexate Inflammation Study. He was randomized to methotrexate/placebo 5mg  weekly and folic acid 1mg  daily. The methotrexate will be increased gradually as the study progresses as long as he tolerates the dose. He denies any new problems or concerns. He went to [facility] Monday and had 2 BART ultrasounds completed. He took 1 5mg  tablet of methotrexate here in the office before leaving today, as well as the folic acid. He was instructed to call for any side effects particularly signs of an allergic response (high fever, rash, etc) . He is also to watch for signs/symptoms of respiratory illness and ulceration. He will return in 1 week for the study.

## 2012-09-26 ENCOUNTER — Ambulatory Visit (INDEPENDENT_AMBULATORY_CARE_PROVIDER_SITE_OTHER): Payer: Medicare Other | Admitting: *Deleted

## 2012-09-26 VITALS — BP 109/70 | HR 64 | Temp 97.4°F | Resp 16 | Wt 175.2 lb

## 2012-09-26 DIAGNOSIS — Z21 Asymptomatic human immunodeficiency virus [HIV] infection status: Secondary | ICD-10-CM

## 2012-09-26 DIAGNOSIS — B20 Human immunodeficiency virus [HIV] disease: Secondary | ICD-10-CM

## 2012-09-26 LAB — COMPREHENSIVE METABOLIC PANEL
ALT: 56 U/L — ABNORMAL HIGH (ref 0–53)
Albumin: 5.1 g/dL (ref 3.5–5.2)
BUN: 8 mg/dL (ref 6–23)
CO2: 26 mEq/L (ref 19–32)
Calcium: 9.6 mg/dL (ref 8.4–10.5)
Chloride: 104 mEq/L (ref 96–112)
Creat: 1.1 mg/dL (ref 0.50–1.35)

## 2012-09-26 LAB — CBC WITH DIFFERENTIAL/PLATELET
Lymphocytes Relative: 54 % — ABNORMAL HIGH (ref 12–46)
Neutro Abs: 2.3 10*3/uL (ref 1.7–7.7)
Neutrophils Relative %: 38 % — ABNORMAL LOW (ref 43–77)
Platelets: 249 10*3/uL (ref 150–400)
RDW: 13.2 % (ref 11.5–15.5)
WBC: 6.2 10*3/uL (ref 4.0–10.5)

## 2012-09-26 NOTE — Progress Notes (Signed)
Patient here for his week 1 study visit. He denies any new complaints of cough, SOB or stomatitits or other problems. He will wait to take his next dose of methotrexate after I call him today about what his dose should be  based on his lab results. Next Thursday we will begin the 24 hr PK assessment. He was instructed to hold his morning viread and  study medications and bring them with him to the clinic. He also needs to fast that morning. He was also instructed on the viread medication diary and will document his dose times for 4 days prior to the visit.

## 2012-09-27 ENCOUNTER — Other Ambulatory Visit: Payer: Self-pay | Admitting: *Deleted

## 2012-10-03 ENCOUNTER — Telehealth: Payer: Self-pay | Admitting: *Deleted

## 2012-10-03 ENCOUNTER — Ambulatory Visit (INDEPENDENT_AMBULATORY_CARE_PROVIDER_SITE_OTHER): Payer: Self-pay | Admitting: *Deleted

## 2012-10-03 VITALS — BP 121/75 | HR 66 | Temp 97.4°F | Resp 16 | Wt 174.5 lb

## 2012-10-03 DIAGNOSIS — Z21 Asymptomatic human immunodeficiency virus [HIV] infection status: Secondary | ICD-10-CM

## 2012-10-03 DIAGNOSIS — B20 Human immunodeficiency virus [HIV] disease: Secondary | ICD-10-CM

## 2012-10-03 DIAGNOSIS — Z113 Encounter for screening for infections with a predominantly sexual mode of transmission: Secondary | ICD-10-CM

## 2012-10-03 DIAGNOSIS — E785 Hyperlipidemia, unspecified: Secondary | ICD-10-CM

## 2012-10-03 DIAGNOSIS — R52 Pain, unspecified: Secondary | ICD-10-CM

## 2012-10-03 LAB — COMPREHENSIVE METABOLIC PANEL
ALT: 41 U/L (ref 0–53)
Albumin: 4.2 g/dL (ref 3.5–5.2)
Alkaline Phosphatase: 59 U/L (ref 39–117)
Glucose, Bld: 155 mg/dL — ABNORMAL HIGH (ref 70–99)
Potassium: 4 mEq/L (ref 3.5–5.3)
Sodium: 140 mEq/L (ref 135–145)
Total Bilirubin: 0.6 mg/dL (ref 0.3–1.2)
Total Protein: 6.8 g/dL (ref 6.0–8.3)

## 2012-10-03 LAB — CBC WITH DIFFERENTIAL/PLATELET
Basophils Absolute: 0 10*3/uL (ref 0.0–0.1)
HCT: 35.4 % — ABNORMAL LOW (ref 39.0–52.0)
Hemoglobin: 12.3 g/dL — ABNORMAL LOW (ref 13.0–17.0)
Lymphocytes Relative: 55 % — ABNORMAL HIGH (ref 12–46)
Monocytes Absolute: 0.4 10*3/uL (ref 0.1–1.0)
Neutro Abs: 1.8 10*3/uL (ref 1.7–7.7)
Neutrophils Relative %: 32 % — ABNORMAL LOW (ref 43–77)
RDW: 13.6 % (ref 11.5–15.5)
WBC: 5.7 10*3/uL (ref 4.0–10.5)

## 2012-10-03 MED ORDER — OXYCODONE HCL 10 MG PO TABS: 10.0000 mg | ORAL_TABLET | Freq: Two times a day (BID) | ORAL | Status: DC | PRN

## 2012-10-03 NOTE — Progress Notes (Signed)
Patient here for A5314 PK draws (12 hours) and 24 hour in am. He denies any new problems or concerns. He increased his methotrexate/placebo to 2 tablets last week. He was fasting and brought all his meds with him this morning to take prior to PK draws. After the am draw he will return in 2 weeks.

## 2012-10-03 NOTE — Telephone Encounter (Signed)
Pt wanted to be back on oxycodone, and wrote script

## 2012-10-04 ENCOUNTER — Encounter: Payer: Self-pay | Admitting: Neurosurgery

## 2012-10-07 ENCOUNTER — Ambulatory Visit: Payer: Medicare Other | Admitting: Neurosurgery

## 2012-10-07 ENCOUNTER — Encounter (INDEPENDENT_AMBULATORY_CARE_PROVIDER_SITE_OTHER): Payer: Medicare Other | Admitting: *Deleted

## 2012-10-07 DIAGNOSIS — Z48812 Encounter for surgical aftercare following surgery on the circulatory system: Secondary | ICD-10-CM

## 2012-10-07 DIAGNOSIS — I739 Peripheral vascular disease, unspecified: Secondary | ICD-10-CM | POA: Diagnosis not present

## 2012-10-07 DIAGNOSIS — I70219 Atherosclerosis of native arteries of extremities with intermittent claudication, unspecified extremity: Secondary | ICD-10-CM

## 2012-10-08 ENCOUNTER — Other Ambulatory Visit: Payer: Self-pay | Admitting: *Deleted

## 2012-10-08 DIAGNOSIS — I739 Peripheral vascular disease, unspecified: Secondary | ICD-10-CM

## 2012-10-08 DIAGNOSIS — Z48812 Encounter for surgical aftercare following surgery on the circulatory system: Secondary | ICD-10-CM

## 2012-10-10 ENCOUNTER — Encounter: Payer: Self-pay | Admitting: Surgery

## 2012-10-10 NOTE — Progress Notes (Signed)
Patient ID: RAD GRAMLING, male   DOB: 27-Jan-1951, 62 y.o.   MRN: [id number] [facility]  CM: Neila Gear

## 2012-10-15 NOTE — Telephone Encounter (Signed)
review 

## 2012-10-16 ENCOUNTER — Ambulatory Visit (INDEPENDENT_AMBULATORY_CARE_PROVIDER_SITE_OTHER): Payer: Medicare Other | Admitting: *Deleted

## 2012-10-16 VITALS — BP 125/78 | HR 67 | Temp 97.5°F | Resp 17 | Wt 175.5 lb

## 2012-10-16 DIAGNOSIS — B20 Human immunodeficiency virus [HIV] disease: Secondary | ICD-10-CM

## 2012-10-16 DIAGNOSIS — Z21 Asymptomatic human immunodeficiency virus [HIV] infection status: Secondary | ICD-10-CM

## 2012-10-16 LAB — COMPREHENSIVE METABOLIC PANEL
AST: 34 U/L (ref 0–37)
Albumin: 4.7 g/dL (ref 3.5–5.2)
Alkaline Phosphatase: 59 U/L (ref 39–117)
BUN: 8 mg/dL (ref 6–23)
Creat: 0.95 mg/dL (ref 0.50–1.35)
Glucose, Bld: 305 mg/dL — ABNORMAL HIGH (ref 70–99)
Potassium: 4.4 mEq/L (ref 3.5–5.3)
Total Bilirubin: 0.6 mg/dL (ref 0.3–1.2)

## 2012-10-16 LAB — CD4/CD8 (T-HELPER/T-SUPPRESSOR CELL)
CD4%: 21.3
CD8 % Suppressor T Cell: 62.2
CD8: 1804

## 2012-10-16 NOTE — Progress Notes (Signed)
Pt here for A5314, week 4. Assessment unchanged since last study visit. Non-fasting labs were drawn. Vital signs stable. He has been adhering to ARV regimen and his MTX/P with folic acid. Study Medications were dispensed. Pt received $50.00 gift card for study visit. Tacey Heap RN

## 2012-10-24 ENCOUNTER — Encounter: Payer: Self-pay | Admitting: Infectious Disease

## 2012-11-06 ENCOUNTER — Other Ambulatory Visit: Payer: Self-pay | Admitting: *Deleted

## 2012-11-06 MED ORDER — METFORMIN HCL 500 MG PO TABS: 1000.0000 mg | ORAL_TABLET | Freq: Two times a day (BID) | ORAL | Status: DC

## 2012-11-13 ENCOUNTER — Encounter: Payer: Self-pay | Admitting: Infectious Disease

## 2012-11-13 LAB — HIV-1 RNA QUANT-NO REFLEX-BLD: HIV-1 RNA Viral Load: <40

## 2012-11-14 ENCOUNTER — Ambulatory Visit (INDEPENDENT_AMBULATORY_CARE_PROVIDER_SITE_OTHER): Payer: Medicare Other | Admitting: *Deleted

## 2012-11-14 ENCOUNTER — Other Ambulatory Visit: Payer: Self-pay

## 2012-11-14 VITALS — BP 138/81 | HR 69 | Temp 97.5°F | Resp 16 | Wt 176.5 lb

## 2012-11-14 DIAGNOSIS — B20 Human immunodeficiency virus [HIV] disease: Secondary | ICD-10-CM

## 2012-11-14 DIAGNOSIS — Z21 Asymptomatic human immunodeficiency virus [HIV] infection status: Secondary | ICD-10-CM

## 2012-11-14 LAB — COMPREHENSIVE METABOLIC PANEL
ALT: 45 U/L (ref 0–53)
Alkaline Phosphatase: 49 U/L (ref 39–117)
CO2: 22 mEq/L (ref 19–32)
Sodium: 138 mEq/L (ref 135–145)
Total Bilirubin: 0.6 mg/dL (ref 0.3–1.2)
Total Protein: 6.6 g/dL (ref 6.0–8.3)

## 2012-11-14 NOTE — Progress Notes (Signed)
Patient here for week 8 study visit. He denies any new problems or side effects from the study meds. No respiratory problems noted. He has been very adherent with both his study meds and HIV meds. He will return in 4 weeks for the next visit and have his BART done the same week.

## 2012-11-29 ENCOUNTER — Encounter: Payer: Self-pay | Admitting: Infectious Disease

## 2012-11-29 LAB — HIV-1 RNA QUANT-NO REFLEX-BLD: HIV-1 RNA Viral Load: <40

## 2012-12-05 ENCOUNTER — Ambulatory Visit (INDEPENDENT_AMBULATORY_CARE_PROVIDER_SITE_OTHER): Payer: Medicare Other | Admitting: Internal Medicine

## 2012-12-05 ENCOUNTER — Encounter: Payer: Self-pay | Admitting: Internal Medicine

## 2012-12-05 VITALS — BP 123/77 | HR 74 | Temp 97.1°F | Ht 73.0 in | Wt 172.3 lb

## 2012-12-05 DIAGNOSIS — E119 Type 2 diabetes mellitus without complications: Secondary | ICD-10-CM | POA: Diagnosis not present

## 2012-12-05 DIAGNOSIS — IMO0002 Reserved for concepts with insufficient information to code with codable children: Secondary | ICD-10-CM | POA: Diagnosis not present

## 2012-12-05 DIAGNOSIS — B20 Human immunodeficiency virus [HIV] disease: Secondary | ICD-10-CM | POA: Diagnosis not present

## 2012-12-05 DIAGNOSIS — E1149 Type 2 diabetes mellitus with other diabetic neurological complication: Secondary | ICD-10-CM | POA: Diagnosis not present

## 2012-12-05 LAB — POCT GLYCOSYLATED HEMOGLOBIN (HGB A1C): Hemoglobin A1C: 7.5

## 2012-12-05 NOTE — Assessment & Plan Note (Signed)
BP Readings from Last 3 Encounters:  12/05/12 123/77  11/14/12 138/81  10/16/12 125/78    Lab Results  Component Value Date   NA 138 11/14/2012   K 4.6 11/14/2012   CREATININE 0.86 11/14/2012    Assessment: Blood pressure control: controlled Progress toward BP goal:  at goal Comments: none  Plan: Medications:  continue current medications Educational resources provided: brochure;handout Self management tools provided: other (see comments) Other plans: no further

## 2012-12-05 NOTE — Assessment & Plan Note (Signed)
Lab Results  Component Value Date   HGBA1C 7.8 07/25/2012   HGBA1C 7.1 04/25/2012   HGBA1C 7.5 01/18/2012     Assessment-->A/P from 07/2012   Diabetes control: fair control  Progress toward A1C goal:  deteriorated  Comments: none  Plan:  Medications:  continue current medications, increased dinner Novolog dose   Home glucose monitoring:   Frequency: 3 times a day   Timing: before breakfast;before lunch;before dinner  Instruction/counseling given: discussed diet  Educational resources provided: brochure;video  Self management tools provided: copy of home glucose meter download  Other plans: consider referral to nutritionist in the future      A/p from 12/05/12  Lab Results  Component Value Date   HGBA1C 7.5 12/05/2012   HGBA1C 7.8 07/25/2012   HGBA1C 7.1 04/25/2012     Assessment: Diabetes control: good control (HgbA1C at goal) Progress toward A1C goal:  improved Comments: none  Plan: Medications:  will continue Lantus 65 units qhs and change Humalog from 12, 24, 22 to 14, 24, 24 to try to decrease HA1C to goal <7.0 Home glucose monitoring: Frequency: 3 times a day Timing: before meals;at bedtime Instruction/counseling given: provided printed educational material Educational resources provided: brochure;handout Self management tools provided: other (see comments) Other plans: Rx for diabetic shoes today

## 2012-12-05 NOTE — Progress Notes (Signed)
  Subjective:    Patient ID: Nathan [name], male    DOB: 05-24-1950, 62 y.o.   MRN: [id number]  HPI Comments:  62 y.o PMH DM 2 associated with PVD (HA1C 7.8 07/2012 today HA1C is 7.5, cbg 209), controlled HIV (cd4 663, viral load <40), dyslipidemia (LDL 85 08/2012), HTN (BP 123/77), h/o right AKA, h/o colon polyps, GERD, h/o Hep B core Ab +, h/o MRSA, h/o MS  He presents for f/u for DM and other medical conditions being managed by [facility].  His home readings show cbg fasting am of 187 and throughout the day >250 to <300.    He needs a Rx for diabetic shoes and he brings in a form to get a handicap sticker today.       Review of Systems  Respiratory: Negative for shortness of breath.   Cardiovascular: Negative for chest pain and leg swelling.  Skin:       Denies skin breakdown under left prosthesis        Objective:   Physical Exam  Nursing note and vitals reviewed. Constitutional: He is oriented to person, place, and time. Vital signs are normal. He appears well-developed and well-nourished. He is cooperative.  HENT:  Head: Normocephalic and atraumatic.  Mouth/Throat: Oropharynx is clear and moist and mucous membranes are normal. He has dentures. No oropharyngeal exudate.  Eyes: Conjunctivae are normal. Pupils are equal, round, and reactive to light. Right eye exhibits no discharge. Left eye exhibits no discharge. No scleral icterus.  Cardiovascular: Normal rate, regular rhythm, S1 normal, S2 normal and normal heart sounds.   No murmur heard. Pulses:      Dorsalis pedis pulses are 1+ on the left side.       Posterior tibial pulses are 1+ on the left side.  Pulmonary/Chest: Effort normal and breath sounds normal.  Abdominal: Soft. Bowel sounds are normal. He exhibits no distension. There is no tenderness.  Musculoskeletal: He exhibits no edema.  Neurological: He is alert and oriented to person, place, and time.  Limp due to right prosthesis at baseline  Skin: Skin is warm, dry and  intact. No rash noted.  Psychiatric: He has a normal mood and affect. His speech is normal and behavior is normal. Judgment and thought content normal. Cognition and memory are normal.          Assessment & Plan:  F/u 3 months then get flu shot

## 2012-12-05 NOTE — Assessment & Plan Note (Signed)
Controlled following with ID no new meds

## 2012-12-05 NOTE — Patient Instructions (Addendum)
General Instructions: Please follow up in 3 months.  Increase you Humalog to 14, 24 and 24  You flu shot will be due in 01/2013 please return to get it  Pleasure to see you again   Treatment Goals:  Goals (1 Years of Data) as of 12/05/12         As of Today 11/14/12 10/16/12 10/03/12 09/26/12     Blood Pressure    . Blood Pressure < 140/90  123/77 138/81 125/78 121/75 109/70     Lifestyle    . Prevent Falls           Result Component    . HDL > 40          . HEMOGLOBIN A1C < 7.0  7.5        . LDL CALC < 100            Progress Toward Treatment Goals:  Treatment Goal 12/05/2012  Hemoglobin A1C improved  Blood pressure at goal  Prevent falls (No Data)    Self Care Goals & Plans:  Self Care Goal 12/05/2012  Manage my medications take my medicines as prescribed; bring my medications to every visit; refill my medications on time  Monitor my health bring my glucose meter and log to each visit; keep track of my blood glucose  Eat healthy foods drink diet soda or water instead of juice or soda; eat more vegetables; eat foods that are low in salt; eat baked foods instead of fried foods; eat fruit for snacks and desserts; eat smaller portions  Be physically active find an activity I enjoy  Prevent falls wear appropriate shoes  Meeting treatment goals maintain the current self-care plan    Home Blood Glucose Monitoring 12/05/2012  Check my blood sugar 3 times a day  When to check my blood sugar before meals; at bedtime     Care Management & Community Referrals:  Referral 12/05/2012  Referrals made for care management support none needed  Referrals made to community resources none       Diabetes, Eating Away From Home Sometimes, you might eat in a restaurant or have meals that are prepared by someone else. You can enjoy eating out. However, the portions in restaurants may be much larger than needed. Listed below are some ideas to help you choose foods that will keep your  blood glucose (sugar) in better control.  TIPS FOR EATING OUT  Know your meal plan and how many carbohydrate servings you should have at each meal. You may wish to carry a copy of your meal plan in your purse or wallet. Learn the foods included in each food group.  Make a list of restaurants near you that offer healthy choices. Take a copy of the carry-out menus to see what they offer. Then, you can plan what you will order ahead of time.  Become familiar with serving sizes by practicing them at home using measuring cups and spoons. Once you learn to recognize portion sizes, you will be able to correctly estimate the amount of total carbohydrate you are allowed to eat at the restaurant. Ask for a takeout box if the portion is more than you should have. When your food comes, leave the amount you should have on the plate, and put the rest in the takeout box before you start eating.  Plan ahead if your mealtime will be different from usual. Check with your caregiver to find out how to time meals and  medicine if you are taking insulin.  Avoid high-fat foods, such as fried foods, cream sauces, high-fat salad dressings, or any added butter or margarine.  Do not be afraid to ask questions. Ask your server about the portion size, cooking methods, ingredients and if items can be substituted. Restaurants do not list all available items on the menu. You can ask for your main entree to be prepared using skim milk, oil instead of butter or margarine, and without gravy or sauces. Ask your waiter or waitress to serve salad dressings, gravy, sauces, margarine, and sour cream on the side. You can then add the amount your meal plan suggests.  Add more vegetables whenever possible.  Avoid items that are labeled "jumbo," "giant," "deluxe," or "supersized."  You may want to split an entre with someone and order an extra side salad.  Watch for hidden calories in foods like croutons, bacon, or cheese.  Ask your  server to take away the bread basket or chips from your table.  Order a dinner salad as an appetizer. You can eat most foods served in a restaurant. Some foods are better choices than others. Breads and Starches  Recommended: All kinds of bread (wheat, rye, white, oatmeal, [place], [place], raisin), hard or soft dinner rolls, frankfurter or hamburger buns, small bagels, small corn or whole-wheat flour tortillas.  Avoid: Frosted or glazed breads, butter rolls, egg or cheese breads, croissants, sweet rolls, pastries, coffee cake, glazed or frosted doughnuts, muffins. Crackers  Recommended: Animal crackers, graham, rye, saltine, oyster, and matzoth crackers. Bread sticks, melba toast, rusks, pretzels, popcorn (without fat), zwieback toast.  Avoid: High-fat snack crackers or chips. Buttered popcorn. Cereals  Recommended: Hot and cold cereals. Whole grains such as oatmeal or shredded wheat are good choices.  Avoid: Sugar-coated or granola type cereals. Potatoes/Pasta/Rice/Beans  Recommended: Order baked, boiled, or mashed potatoes, rice or noodles without added fat, whole beans. Order gravies, butter, margarine, or sauces on the side so you can control the amount you add.  Avoid: Hash browns or fried potatoes. Potatoes, pasta, or rice prepared with cream or cheese sauce. Potato or pasta salads prepared with large amounts of dressing. Fried beans or fried rice. Vegetables  Recommended: Order steamed, baked, boiled, or stewed vegetables without sauces or extra fat. Ask that sauce be served on the side. If vegetables are not listed on the menu, ask what is available.  Avoid: Vegetables prepared with cream, butter, or cheese sauce. Fried vegetables. Salad Bars  Recommended: Many of the vegetables at a salad bar are considered "free." Use lemon juice, vinegar, or low-calorie salad dressing (fewer than 20 calories per serving) as "free" dressings for your salad. Look for salad bar ingredients that  have no added fat or sugar such as tomatoes, lettuce, cucumbers, broccoli, carrots, onions, and mushrooms.  Avoid: Prepared salads with large amounts of dressing, such as coleslaw, caesar salad, macaroni salad, bean salad, or carrot salad. Fruit  Recommended: Eat fresh fruit or fresh fruit salad without added dressing. A salad bar often offers fresh fruit choices, but canned fruit at a restaurant is usually packed in sugar or syrup.  Avoid: Sweetened canned or frozen fruits, plain or sweetened fruit juice. Fruit salads with dressing, sour cream, or sugar added to them. Meat and Meat Substitutes  Recommended: Order broiled, baked, roasted, or grilled meat, poultry, or fish. Trim off all visible fat. Do not eat the skin of poultry. The size stated on the menu is the raw weight. Meat shrinks by  in cooking (for example, 4 oz raw equals 3 oz cooked meat).  Avoid: Deep-fat fried meat, poultry, or fish. Breaded meats. Eggs  Recommended: Order soft, hard-cooked, poached, or scrambled eggs. Omelets may be okay, depending on what ingredients are added. Egg substitutes are also a good choice.  Avoid: Fried eggs, eggs prepared with cream or cheese sauce. Milk  Recommended: Order low-fat or fat-free milk according to your meal plan. Plain, nonfat yogurt or flavored yogurt with no sugar added may be used as a substitute for milk. Soy milk may also be used.  Avoid: Milk shakes or sweetened milk beverages. Soups and Combination Foods  Recommended: Clear broth or consomm are "free" foods and may be used as an appetizer. Broth-based soups with fat removed count as a starch serving and are preferred over cream soups. Soups made with beans or split peas may be eaten but count as a starch.  Avoid: Fatty soups, soup made with cream, cheese soup. Combination foods prepared with excessive amounts of fat or with cream or cheese sauces. Desserts and Sweets  Recommended: Ask for fresh fruit. Sponge or angel  food cake without icing, ice milk, no sugar added ice cream, sherbet, or frozen yogurt may fit into your meal plan occasionally.  Avoid: Pastries, puddings, pies, cakes with icing, custard, gelatin desserts. Fats and Oils  Recommended: Choose healthy fats such as olive oil, canola oil, or tub margarine, reduced fat or fat-free sour cream, cream cheese, avocado, or nuts.  Avoid: Any fats in excess of your allowed portion. Deep-fried foods or any food with a large amount of fat. Note: Ask for all fats to be served on the side, and limit your portion sizes according to your meal plan. Document Released: 04/24/2005 Document Revised: 07/17/2011 Document Reviewed: 11/12/2008 [facility] Patient Information 2014 [place], [place].  Diabetes Meal Planning Guide The diabetes meal planning guide is a tool to help you plan your meals and snacks. It is important for people with diabetes to manage their blood glucose (sugar) levels. Choosing the right foods and the right amounts throughout your day will help control your blood glucose. Eating right can even help you improve your blood pressure and reach or maintain a healthy weight. CARBOHYDRATE COUNTING MADE EASY When you eat carbohydrates, they turn to sugar. This raises your blood glucose level. Counting carbohydrates can help you control this level so you feel better. When you plan your meals by counting carbohydrates, you can have more flexibility in what you eat and balance your medicine with your food intake. Carbohydrate counting simply means adding up the total amount of carbohydrate grams in your meals and snacks. Try to eat about the same amount at each meal. Foods with carbohydrates are listed below. Each portion below is 1 carbohydrate serving or 15 grams of carbohydrates. Ask your dietician how many grams of carbohydrates you should eat at each meal or snack. Grains and Starches  1 slice bread.   English muffin or hotdog/hamburger bun.   cup  cold cereal (unsweetened).   cup cooked pasta or rice.   cup starchy vegetables (corn, potatoes, peas, beans, winter squash).  1 tortilla (6 inches).   bagel.  1 waffle or pancake (size of a CD).   cup cooked cereal.  4 to 6 small crackers. *Whole grain is recommended. Fruit  1 cup fresh unsweetened berries, melon, papaya, pineapple.  1 small fresh fruit.   banana or mango.   cup fruit juice (4 oz unsweetened).   cup canned fruit  in natural juice or water.  2 tbs dried fruit.  12 to 15 grapes or cherries. Milk and Yogurt  1 cup fat-free or 1% milk.  1 cup soy milk.  6 oz light yogurt with sugar-free sweetener.  6 oz low-fat soy yogurt.  6 oz plain yogurt. Vegetables  1 cup raw or  cup cooked is counted as 0 carbohydrates or a "free" food.  If you eat 3 or more servings at 1 meal, count them as 1 carbohydrate serving. Other Carbohydrates   oz chips or pretzels.   cup ice cream or frozen yogurt.   cup sherbet or sorbet.  2 inch square cake, no frosting.  1 tbs honey, sugar, jam, jelly, or syrup.  2 small cookies.  3 squares of graham crackers.  3 cups popcorn.  6 crackers.  1 cup broth-based soup.  Count 1 cup casserole or other mixed foods as 2 carbohydrate servings.  Foods with less than 20 calories in a serving may be counted as 0 carbohydrates or a "free" food. You may want to purchase a book or computer software that lists the carbohydrate gram counts of different foods. In addition, the nutrition facts panel on the labels of the foods you eat are a good source of this information. The label will tell you how big the serving size is and the total number of carbohydrate grams you will be eating per serving. Divide this number by 15 to obtain the number of carbohydrate servings in a portion. Remember, 1 carbohydrate serving equals 15 grams of carbohydrate. SERVING SIZES Measuring foods and serving sizes helps you make sure you are  getting the right amount of food. The list below tells how big or small some common serving sizes are.  1 oz.........4 stacked dice.  3 oz........[place] KitchenDeck of cards.  1 tsp.......[place] KitchenTip of little finger.  1 tbs......[place] KitchenMarland KitchenThumb.  2 tbs.......[place] KitchenGolf ball.   cup......[place] KitchenHalf of a fist.  1 cup.......[place] KitchenA fist. SAMPLE DIABETES MEAL PLAN Below is a sample meal plan that includes foods from the grain and starches, dairy, vegetable, fruit, and meat groups. A dietician can individualize a meal plan to fit your calorie needs and tell you the number of servings needed from each food group. However, controlling the total amount of carbohydrates in your meal or snack is more important than making sure you include all of the food groups at every meal. You may interchange carbohydrate containing foods (dairy, starches, and fruits). The meal plan below is an example of a 2000 calorie diet using carbohydrate counting. This meal plan has 17 carbohydrate servings. Breakfast  1 cup oatmeal (2 carb servings).   cup light yogurt (1 carb serving).  1 cup blueberries (1 carb serving).   cup almonds. Snack  1 large apple (2 carb servings).  1 low-fat string cheese stick. Lunch  Chicken breast salad.  1 cup spinach.   cup chopped tomatoes.  2 oz chicken breast, sliced.  2 tbs low-fat [place] dressing.  12 whole-wheat crackers (2 carb servings).  12 to 15 grapes (1 carb serving).  1 cup low-fat milk (1 carb serving). Snack  1 cup carrots.   cup hummus (1 carb serving). Dinner  3 oz broiled salmon.  1 cup brown rice (3 carb servings). Snack  1  cups steamed broccoli (1 carb serving) drizzled with 1 tsp olive oil and lemon juice.  1 cup light pudding (2 carb servings). DIABETES MEAL PLANNING WORKSHEET Your dietician can use this worksheet to help you decide how many  servings of foods and what types of foods are right for you.  BREAKFAST Food Group and Servings / Carb  Servings Grain/Starches __________________________________ Dairy __________________________________________ Vegetable ______________________________________ Fruit ___________________________________________ Meat __________________________________________ Fat ____________________________________________ LUNCH Food Group and Servings / Carb Servings Grain/Starches ___________________________________ Dairy ___________________________________________ Fruit ____________________________________________ Meat ___________________________________________ Fat _____________________________________________ Laural Golden Food Group and Servings / Carb Servings Grain/Starches ___________________________________ Dairy ___________________________________________ Fruit ____________________________________________ Meat ___________________________________________ Fat _____________________________________________ SNACKS Food Group and Servings / Carb Servings Grain/Starches ___________________________________ Dairy ___________________________________________ Vegetable _______________________________________ Fruit ____________________________________________ Meat ___________________________________________ Fat _____________________________________________ DAILY TOTALS Starches _________________________ Vegetable ________________________ Fruit ____________________________ Dairy ____________________________ Meat ____________________________ Fat ______________________________ Document Released: 01/19/2005 Document Revised: 07/17/2011 Document Reviewed: 11/30/2008 [employer] Patient Information 2014 [employer].  DASH Diet The DASH diet stands for "Dietary Approaches to Stop Hypertension." It is a healthy eating plan that has been shown to reduce high blood pressure (hypertension) in as little as 14 days, while also possibly providing other significant health benefits. These other health benefits include  reducing the risk of breast cancer after menopause and reducing the risk of type 2 diabetes, heart disease, colon cancer, and stroke. Health benefits also include weight loss and slowing kidney failure in patients with chronic kidney disease.  DIET GUIDELINES  Limit salt (sodium). Your diet should contain less than 1500 mg of sodium daily.  Limit refined or processed carbohydrates. Your diet should include mostly whole grains. Desserts and added sugars should be used sparingly.  Include small amounts of heart-healthy fats. These types of fats include nuts, oils, and tub margarine. Limit saturated and trans fats. These fats have been shown to be harmful in the body. CHOOSING FOODS  The following food groups are based on a 2000 calorie diet. See your Registered Dietitian for individual calorie needs. Grains and Grain Products (6 to 8 servings daily)  Eat More Often: Whole-wheat bread, brown rice, whole-grain or wheat pasta, quinoa, popcorn without added fat or salt (air popped).  Eat Less Often: White bread, white pasta, white rice, cornbread. Vegetables (4 to 5 servings daily)  Eat More Often: Fresh, frozen, and canned vegetables. Vegetables may be raw, steamed, roasted, or grilled with a minimal amount of fat.  Eat Less Often/Avoid: Creamed or fried vegetables. Vegetables in a cheese sauce. Fruit (4 to 5 servings daily)  Eat More Often: All fresh, canned (in natural juice), or frozen fruits. Dried fruits without added sugar. One hundred percent fruit juice ( cup [237 mL] daily).  Eat Less Often: Dried fruits with added sugar. Canned fruit in light or heavy syrup. [employer], Fish, and Poultry (2 servings or less daily. One serving is 3 to 4 oz [85-114 g]).  Eat More Often: Ninety percent or leaner ground beef, tenderloin, sirloin. Round cuts of beef, chicken breast, [place] breast. All fish. Grill, bake, or broil your meat. Nothing should be fried.  Eat Less Often/Avoid: Fatty cuts of  meat, [place], or chicken leg, thigh, or wing. Fried cuts of meat or fish. Dairy (2 to 3 servings)  Eat More Often: Low-fat or fat-free milk, low-fat plain or light yogurt, reduced-fat or part-skim cheese.  Eat Less Often/Avoid: Milk (whole, 2%).Whole milk yogurt. Full-fat cheeses. Nuts, Seeds, and Legumes (4 to 5 servings per week)  Eat More Often: All without added salt.  Eat Less Often/Avoid: Salted nuts and seeds, canned beans with added salt. Fats and Sweets (limited)  Eat More Often: Vegetable oils, tub margarines without trans fats, sugar-free gelatin. Mayonnaise and salad dressings.  Eat Less Often/Avoid: Coconut oils, palm oils, butter,  stick margarine, cream, half and half, cookies, candy, pie. FOR MORE INFORMATION The Dash Diet Eating Plan: www.dashdiet.org Document Released: 04/13/2011 Document Revised: 07/17/2011 Document Reviewed: 04/13/2011 [facility] Patient Information 2014 [place], [place].  Hypertension As your heart beats, it forces blood through your arteries. This force is your blood pressure. If the pressure is too high, it is called hypertension (HTN) or high blood pressure. HTN is dangerous because you may have it and not know it. High blood pressure may mean that your heart has to work harder to pump blood. Your arteries may be narrow or stiff. The extra work puts you at risk for heart disease, stroke, and other problems.  Blood pressure consists of two numbers, a higher number over a lower, 110/72, for example. It is stated as "110 over 72." The ideal is below 120 for the top number (systolic) and under 80 for the bottom (diastolic). Write down your blood pressure today. You should pay close attention to your blood pressure if you have certain conditions such as:  Heart failure.  Prior heart attack.  Diabetes  Chronic kidney disease.  Prior stroke.  Multiple risk factors for heart disease. To see if you have HTN, your blood pressure should be measured  while you are seated with your arm held at the level of the heart. It should be measured at least twice. A one-time elevated blood pressure reading (especially in the Emergency Department) does not mean that you need treatment. There may be conditions in which the blood pressure is different between your right and left arms. It is important to see your caregiver soon for a recheck. Most people have essential hypertension which means that there is not a specific cause. This type of high blood pressure may be lowered by changing lifestyle factors such as:  Stress.  Smoking.  Lack of exercise.  Excessive weight.  Drug/tobacco/alcohol use.  Eating less salt. Most people do not have symptoms from high blood pressure until it has caused damage to the body. Effective treatment can often prevent, delay or reduce that damage. TREATMENT  When a cause has been identified, treatment for high blood pressure is directed at the cause. There are a large number of medications to treat HTN. These fall into several categories, and your caregiver will help you select the medicines that are best for you. Medications may have side effects. You should review side effects with your caregiver. If your blood pressure stays high after you have made lifestyle changes or started on medicines,   Your medication(s) may need to be changed.  Other problems may need to be addressed.  Be certain you understand your prescriptions, and know how and when to take your medicine.  Be sure to follow up with your caregiver within the time frame advised (usually within two weeks) to have your blood pressure rechecked and to review your medications.  If you are taking more than one medicine to lower your blood pressure, make sure you know how and at what times they should be taken. Taking two medicines at the same time can result in blood pressure that is too low. SEEK IMMEDIATE MEDICAL CARE IF:  You develop a severe headache,  blurred or changing vision, or confusion.  You have unusual weakness or numbness, or a faint feeling.  You have severe chest or abdominal pain, vomiting, or breathing problems. MAKE SURE YOU:   Understand these instructions.  Will watch your condition.  Will get help right away if you are not doing  well or get worse. Document Released: 04/24/2005 Document Revised: 07/17/2011 Document Reviewed: 12/13/2007 [facility] Patient Information 2014 [place], [place].  Type 2 Diabetes Mellitus, Adult Type 2 diabetes mellitus, often simply referred to as type 2 diabetes, is a long-lasting (chronic) disease. In type 2 diabetes, the pancreas does not make enough insulin (a hormone), the cells are less responsive to the insulin that is made (insulin resistance), or both. Normally, insulin moves sugars from food into the tissue cells. The tissue cells use the sugars for energy. The lack of insulin or the lack of normal response to insulin causes excess sugars to build up in the blood instead of going into the tissue cells. As a result, high blood sugar (hyperglycemia) develops. The effect of high sugar (glucose) levels can cause many complications. Type 2 diabetes was also previously called adult-onset diabetes but it can occur at any age.  RISK FACTORS  A person is predisposed to developing type 2 diabetes if someone in the family has the disease and also has one or more of the following primary risk factors:  Overweight.  An inactive lifestyle.  A history of consistently eating high-calorie foods. Maintaining a normal weight and regular physical activity can reduce the chance of developing type 2 diabetes. SYMPTOMS  A person with type 2 diabetes may not show symptoms initially. The symptoms of type 2 diabetes appear slowly. The symptoms include:  Increased thirst (polydipsia).  Increased urination (polyuria).  Increased urination during the night (nocturia).  Weight loss. This weight loss may  be rapid.  Frequent, recurring infections.  Tiredness (fatigue).  Weakness.  Vision changes, such as blurred vision.  Fruity smell to your breath.  Abdominal pain.  Nausea or vomiting.  Cuts or bruises which are slow to heal.  Tingling or numbness in the hands or feet. DIAGNOSIS Type 2 diabetes is frequently not diagnosed until complications of diabetes are present. Type 2 diabetes is diagnosed when symptoms or complications are present and when blood glucose levels are increased. Your blood glucose level may be checked by one or more of the following blood tests:  A fasting blood glucose test. You will not be allowed to eat for at least 8 hours before a blood sample is taken.  A random blood glucose test. Your blood glucose is checked at any time of the day regardless of when you ate.  A hemoglobin A1c blood glucose test. A hemoglobin A1c test provides information about blood glucose control over the previous 3 months.  An oral glucose tolerance test (OGTT). Your blood glucose is measured after you have not eaten (fasted) for 2 hours and then after you drink a glucose-containing beverage. TREATMENT   You may need to take insulin or diabetes medicine daily to keep blood glucose levels in the desired range.  You will need to match insulin dosing with exercise and healthy food choices. The treatment goal is to maintain the before meal blood sugar (preprandial glucose) level at 70 130 mg/dL. HOME CARE INSTRUCTIONS   Have your hemoglobin A1c level checked twice a year.  Perform daily blood glucose monitoring as directed by your caregiver.  Monitor urine ketones when you are ill and as directed by your caregiver.  Take your diabetes medicine or insulin as directed by your caregiver to maintain your blood glucose levels in the desired range.  Never run out of diabetes medicine or insulin. It is needed every day.  Adjust insulin based on your intake of carbohydrates.  Carbohydrates can raise blood glucose  levels but need to be included in your diet. Carbohydrates provide vitamins, minerals, and fiber which are an essential part of a healthy diet. Carbohydrates are found in fruits, vegetables, whole grains, dairy products, legumes, and foods containing added sugars.    Eat healthy foods. Alternate 3 meals with 3 snacks.  Lose weight if overweight.  Carry a medical alert card or wear your medical alert jewelry.  Carry a 15 gram carbohydrate snack with you at all times to treat low blood glucose (hypoglycemia). Some examples of 15 gram carbohydrate snacks include:  Glucose tablets, 3 or 4   Glucose gel, 15 gram tube  Raisins, 2 tablespoons (24 grams)  Jelly beans, 6  Animal crackers, 8  Regular pop, 4 ounces (120 mL)  Gummy treats, 9  Recognize hypoglycemia. Hypoglycemia occurs with blood glucose levels of 70 mg/dL and below. The risk for hypoglycemia increases when fasting or skipping meals, during or after intense exercise, and during sleep. Hypoglycemia symptoms can include:  Tremors or shakes.  Decreased ability to concentrate.  Sweating.  Increased heart rate.  Headache.  Dry mouth.  Hunger.  Irritability.  Anxiety.  Restless sleep.  Altered speech or coordination.  Confusion.  Treat hypoglycemia promptly. If you are alert and able to safely swallow, follow the 15:15 rule:  Take 15 20 grams of rapid-acting glucose or carbohydrate. Rapid-acting options include glucose gel, glucose tablets, or 4 ounces (120 mL) of fruit juice, regular soda, or low fat milk.  Check your blood glucose level 15 minutes after taking the glucose.  Take 15 20 grams more of glucose if the repeat blood glucose level is still 70 mg/dL or below.  Eat a meal or snack within 1 hour once blood glucose levels return to normal.    Be alert to polyuria and polydipsia which are early signs of hyperglycemia. An early awareness of hyperglycemia  allows for prompt treatment. Treat hyperglycemia as directed by your caregiver.  Engage in at least 150 minutes of moderate-intensity physical activity a week, spread over at least 3 days of the week or as directed by your caregiver. In addition, you should engage in resistance exercise at least 2 times a week or as directed by your caregiver.  Adjust your medicine and food intake as needed if you start a new exercise or sport.  Follow your sick day plan at any time you are unable to eat or drink as usual.  Avoid tobacco use.  Limit alcohol intake to no more than 1 drink per day for nonpregnant women and 2 drinks per day for men. You should drink alcohol only when you are also eating food. Talk with your caregiver whether alcohol is safe for you. Tell your caregiver if you drink alcohol several times a week.  Follow up with your caregiver regularly.  Schedule an eye exam soon after the diagnosis of type 2 diabetes and then annually.  Perform daily skin and foot care. Examine your skin and feet daily for cuts, bruises, redness, nail problems, bleeding, blisters, or sores. A foot exam by a caregiver should be done annually.  Brush your teeth and gums at least twice a day and floss at least once a day. Follow up with your dentist regularly.  Share your diabetes management plan with your workplace or school.  Stay up-to-date with immunizations.  Learn to manage stress.  Obtain ongoing diabetes education and support as needed.  Participate in, or seek rehabilitation as needed to maintain or improve independence and quality of  life. Request a physical or occupational therapy referral if you are having foot or hand numbness or difficulties with grooming, dressing, eating, or physical activity. SEEK MEDICAL CARE IF:   You are unable to eat food or drink fluids for more than 6 hours.  You have nausea and vomiting for more than 6 hours.  Your blood glucose level is over 240 mg/dL.  There  is a change in mental status.  You develop an additional serious illness.  You have diarrhea for more than 6 hours.  You have been sick or have had a fever for a couple of days and are not getting better.  You have pain during any physical activity.  SEEK IMMEDIATE MEDICAL CARE IF:  You have difficulty breathing.  You have moderate to large ketone levels. MAKE SURE YOU:  Understand these instructions.  Will watch your condition.  Will get help right away if you are not doing well or get worse. Document Released: 04/24/2005 Document Revised: 01/17/2012 Document Reviewed: 11/21/2011 [facility] Patient Information 2014 [place], [place].

## 2012-12-06 NOTE — Progress Notes (Signed)
Case discussed with Dr. McLean at the time of the visit.  We reviewed the resident's history and exam and pertinent patient test results.  I agree with the assessment, diagnosis, and plan of care documented in the resident's note.     

## 2012-12-09 NOTE — Addendum Note (Signed)
Addended by: Neomia Dear on: 12/09/2012 07:59 PM   Modules accepted: Orders

## 2012-12-12 ENCOUNTER — Ambulatory Visit (INDEPENDENT_AMBULATORY_CARE_PROVIDER_SITE_OTHER): Payer: Medicare Other | Admitting: *Deleted

## 2012-12-12 VITALS — BP 124/72 | HR 62 | Temp 97.5°F | Resp 16 | Wt 172.5 lb

## 2012-12-12 DIAGNOSIS — B20 Human immunodeficiency virus [HIV] disease: Secondary | ICD-10-CM

## 2012-12-12 DIAGNOSIS — Z21 Asymptomatic human immunodeficiency virus [HIV] infection status: Secondary | ICD-10-CM

## 2012-12-12 DIAGNOSIS — E119 Type 2 diabetes mellitus without complications: Secondary | ICD-10-CM

## 2012-12-12 LAB — BASIC METABOLIC PANEL
Potassium: 4.4 mEq/L (ref 3.5–5.3)
Sodium: 138 mEq/L (ref 135–145)

## 2012-12-12 LAB — LIPID PANEL
Cholesterol: 122 mg/dL (ref 0–200)
HDL: 26 mg/dL — ABNORMAL LOW (ref >39)
Triglycerides: 115 mg/dL (ref <150)

## 2012-12-12 LAB — HEPATIC FUNCTION PANEL
AST: 26 U/L (ref 0–37)
Alkaline Phosphatase: 56 U/L (ref 39–117)
Bilirubin, Direct: 0.1 mg/dL (ref 0.0–0.3)
Total Bilirubin: 0.6 mg/dL (ref 0.3–1.2)

## 2012-12-12 LAB — GLUCOSE, RANDOM: Glucose, Bld: 208 mg/dL — ABNORMAL HIGH (ref 70–99)

## 2012-12-12 NOTE — Progress Notes (Signed)
Patient here for his week 12 A5314 study visit. He denies any new problems or complaints. Continues to take his study meds with 100% adherence. He will wait to take his weekly dose of methotrexate/placebo until he hears from me tomorrow. His dose will be increased to 15mg  /weekly as long as his labs are within study limits. He denies any bleeding, SOB, fever, etc. He will return in 6 weeks for the next study visit

## 2012-12-19 ENCOUNTER — Encounter: Payer: Self-pay | Admitting: Infectious Disease

## 2012-12-31 ENCOUNTER — Ambulatory Visit: Payer: Self-pay | Admitting: Dietician

## 2012-12-31 ENCOUNTER — Ambulatory Visit: Payer: Self-pay | Admitting: Internal Medicine

## 2013-01-01 ENCOUNTER — Other Ambulatory Visit: Payer: Self-pay | Admitting: Internal Medicine

## 2013-01-03 ENCOUNTER — Encounter: Payer: Self-pay | Admitting: Infectious Disease

## 2013-01-03 LAB — CD4/CD8 (T-HELPER/T-SUPPRESSOR CELL)
CD4%: 22.5
CD8 % Suppressor T Cell: 62.9
CD8: 1573

## 2013-01-20 ENCOUNTER — Ambulatory Visit (INDEPENDENT_AMBULATORY_CARE_PROVIDER_SITE_OTHER): Payer: Self-pay | Admitting: *Deleted

## 2013-01-20 ENCOUNTER — Telehealth: Payer: Self-pay | Admitting: Infectious Disease

## 2013-01-20 ENCOUNTER — Other Ambulatory Visit: Payer: Medicare Other

## 2013-01-20 VITALS — BP 119/74 | HR 66 | Temp 97.4°F | Resp 16 | Wt 174.8 lb

## 2013-01-20 DIAGNOSIS — B20 Human immunodeficiency virus [HIV] disease: Secondary | ICD-10-CM

## 2013-01-20 DIAGNOSIS — R52 Pain, unspecified: Secondary | ICD-10-CM

## 2013-01-20 DIAGNOSIS — Z113 Encounter for screening for infections with a predominantly sexual mode of transmission: Secondary | ICD-10-CM

## 2013-01-20 DIAGNOSIS — E785 Hyperlipidemia, unspecified: Secondary | ICD-10-CM

## 2013-01-20 LAB — CBC WITH DIFFERENTIAL/PLATELET
Basophils Absolute: 0 10*3/uL (ref 0.0–0.1)
Basophils Relative: 0 % (ref 0–1)
Eosinophils Relative: 4 % (ref 0–5)
HCT: 35 % — ABNORMAL LOW (ref 39.0–52.0)
Hemoglobin: 12.1 g/dL — ABNORMAL LOW (ref 13.0–17.0)
MCHC: 34.6 g/dL (ref 30.0–36.0)
MCV: 99.4 fL (ref 78.0–100.0)
Monocytes Absolute: 0.5 10*3/uL (ref 0.1–1.0)
Monocytes Relative: 8 % (ref 3–12)
Neutro Abs: 2.1 10*3/uL (ref 1.7–7.7)
RDW: 13.7 % (ref 11.5–15.5)

## 2013-01-20 LAB — COMPLETE METABOLIC PANEL WITH GFR
ALT: 34 U/L (ref 0–53)
AST: 29 U/L (ref 0–37)
Albumin: 4.6 g/dL (ref 3.5–5.2)
Alkaline Phosphatase: 64 U/L (ref 39–117)
GFR, Est Non African American: 82 mL/min
Potassium: 4.4 mEq/L (ref 3.5–5.3)
Sodium: 136 mEq/L (ref 135–145)
Total Bilirubin: 0.5 mg/dL (ref 0.3–1.2)
Total Protein: 7.3 g/dL (ref 6.0–8.3)

## 2013-01-20 LAB — RPR

## 2013-01-20 MED ORDER — OXYCODONE HCL 10 MG PO TABS: 10.0000 mg | ORAL_TABLET | Freq: Two times a day (BID) | ORAL | Status: DC | PRN

## 2013-01-20 NOTE — Telephone Encounter (Signed)
Pt askign for oxycodone for his neuropathic pain refilling

## 2013-01-20 NOTE — Progress Notes (Signed)
Patient here for week 18 A5314 study visit. He denies any new problems or concerns. He increased his methotrexate/placebo dose to 3 tabs after the last visit and has not noticed any sx of bleeding, SOB, stomatitis, etc. He has been very adherent with all of his meds, not missing any doses. He will return in 6 weeks for the next study visit.

## 2013-01-29 ENCOUNTER — Other Ambulatory Visit: Payer: Self-pay | Admitting: Internal Medicine

## 2013-01-31 ENCOUNTER — Other Ambulatory Visit: Payer: Self-pay | Admitting: Internal Medicine

## 2013-01-31 DIAGNOSIS — I739 Peripheral vascular disease, unspecified: Secondary | ICD-10-CM

## 2013-02-01 MED ORDER — CLOPIDOGREL BISULFATE 75 MG PO TABS: 75.0000 mg | ORAL_TABLET | Freq: Every day | ORAL | Status: DC

## 2013-02-03 ENCOUNTER — Encounter: Payer: Self-pay | Admitting: Infectious Disease

## 2013-02-03 ENCOUNTER — Ambulatory Visit (INDEPENDENT_AMBULATORY_CARE_PROVIDER_SITE_OTHER): Payer: Medicare Other | Admitting: Infectious Disease

## 2013-02-03 ENCOUNTER — Other Ambulatory Visit: Payer: Self-pay | Admitting: *Deleted

## 2013-02-03 VITALS — BP 125/70 | HR 73 | Temp 98.2°F | Wt 172.0 lb

## 2013-02-03 DIAGNOSIS — Z23 Encounter for immunization: Secondary | ICD-10-CM | POA: Diagnosis not present

## 2013-02-03 DIAGNOSIS — E1059 Type 1 diabetes mellitus with other circulatory complications: Secondary | ICD-10-CM

## 2013-02-03 DIAGNOSIS — B20 Human immunodeficiency virus [HIV] disease: Secondary | ICD-10-CM | POA: Diagnosis not present

## 2013-02-03 DIAGNOSIS — E785 Hyperlipidemia, unspecified: Secondary | ICD-10-CM

## 2013-02-03 DIAGNOSIS — I10 Essential (primary) hypertension: Secondary | ICD-10-CM

## 2013-02-03 DIAGNOSIS — Z113 Encounter for screening for infections with a predominantly sexual mode of transmission: Secondary | ICD-10-CM | POA: Diagnosis not present

## 2013-02-03 DIAGNOSIS — E1159 Type 2 diabetes mellitus with other circulatory complications: Secondary | ICD-10-CM | POA: Diagnosis not present

## 2013-02-03 DIAGNOSIS — S78119A Complete traumatic amputation at level between unspecified hip and knee, initial encounter: Secondary | ICD-10-CM | POA: Diagnosis not present

## 2013-02-03 MED ORDER — OMEPRAZOLE 40 MG PO CPDR: 40.0000 mg | DELAYED_RELEASE_CAPSULE | Freq: Every day | ORAL | Status: DC

## 2013-02-03 NOTE — Progress Notes (Signed)
Subjective:    Patient ID: Nathan [name], male    DOB: 07-Jan-1951, 62 y.o.   MRN: [id number]  HPI   Nathan [name] is a 62 y.o. male who is doing superbly well on his  antiviral regimen, with twice daily combivir, isentress and once daily viread with undetectable viral load and health cd4 count.   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  He is also currently enrolled in one of our ACTG  studies involving the use of methotrexate. He has approximately 6 weeks left on study and is tolerating medication well.     Review of Systems  Constitutional: Negative for fever, chills, diaphoresis, activity change, appetite change, fatigue and unexpected weight change.  HENT: Negative for congestion, sore throat, rhinorrhea, sneezing, trouble swallowing and sinus pressure.   Eyes: Negative for photophobia and visual disturbance.  Respiratory: Negative for cough, chest tightness, shortness of breath, wheezing and stridor.   Cardiovascular: Negative for chest pain, palpitations and leg swelling.  Gastrointestinal: Negative for nausea, vomiting, abdominal pain, diarrhea, constipation, blood in stool, abdominal distention and anal bleeding.  Genitourinary: Negative for dysuria, hematuria, flank pain and difficulty urinating.  Musculoskeletal: Positive for arthralgias. Negative for myalgias, back pain, joint swelling and gait problem.  Skin: Negative for color change, pallor, rash and wound.  Neurological: Negative for dizziness, tremors, weakness and light-headedness.  Hematological: Negative for adenopathy. Does not bruise/bleed easily.  Psychiatric/Behavioral: Negative for behavioral problems,  confusion, sleep disturbance, dysphoric mood, decreased concentration and agitation.       Objective:   Physical Exam  Constitutional: He is oriented to person, place, and time. He appears well-developed and well-nourished. No distress.  HENT:  Head: Normocephalic and atraumatic.  Mouth/Throat: Oropharynx is clear and moist. No oropharyngeal exudate.  Eyes: Conjunctivae and EOM are normal. Pupils are equal, round, and reactive to light. No scleral icterus.  Neck: Normal range of motion. Neck supple. No JVD present.  Cardiovascular: Normal rate, regular rhythm and normal heart sounds.  Exam reveals no gallop and no friction rub.   No murmur heard. Pulmonary/Chest: Effort normal and breath sounds normal. No respiratory distress. He has no wheezes. He has no rales. He exhibits no tenderness.  Abdominal: He exhibits no distension and no mass. There is no tenderness. There is no rebound and no guarding.  Musculoskeletal: He exhibits no edema and no tenderness.  Lymphadenopathy:    He has no cervical adenopathy.  Neurological: He is alert and oriented to person, place, and time. He has normal reflexes. He exhibits normal muscle tone. Coordination normal.  Skin: Skin is warm and dry. He is not diaphoretic. No erythema. No pallor.  Psychiatric: He has a normal mood and affect. His behavior is normal. Judgment and thought content normal.          Assessment & Plan:   HIV: Continue current regimen he likes it and it is suppressing his virus he is used to it and it is on twice daily regimen  DM: followed by [facility]  Lipids: at goal  ACTG study with MTX: Tolerating well.  Status post amputation : He needed new prescriptions for his prosthetic socks and sleeve send other supplies  which I provided for him.

## 2013-02-04 ENCOUNTER — Encounter: Payer: Self-pay | Admitting: Infectious Disease

## 2013-02-07 ENCOUNTER — Other Ambulatory Visit: Payer: Self-pay | Admitting: *Deleted

## 2013-02-07 DIAGNOSIS — E7800 Pure hypercholesterolemia, unspecified: Secondary | ICD-10-CM

## 2013-02-09 MED ORDER — ATORVASTATIN CALCIUM 40 MG PO TABS: 40.0000 mg | ORAL_TABLET | Freq: Every day | ORAL | Status: DC

## 2013-02-27 ENCOUNTER — Other Ambulatory Visit: Payer: Self-pay | Admitting: *Deleted

## 2013-02-27 DIAGNOSIS — B20 Human immunodeficiency virus [HIV] disease: Secondary | ICD-10-CM

## 2013-02-27 MED ORDER — TENOFOVIR DISOPROXIL FUMARATE 300 MG PO TABS: 300.0000 mg | ORAL_TABLET | Freq: Every day | ORAL | Status: DC

## 2013-02-27 MED ORDER — LAMIVUDINE-ZIDOVUDINE 150-300 MG PO TABS: 1.0000 | ORAL_TABLET | Freq: Two times a day (BID) | ORAL | Status: DC

## 2013-02-27 MED ORDER — RALTEGRAVIR POTASSIUM 400 MG PO TABS: 400.0000 mg | ORAL_TABLET | Freq: Two times a day (BID) | ORAL | Status: DC

## 2013-03-04 ENCOUNTER — Ambulatory Visit (INDEPENDENT_AMBULATORY_CARE_PROVIDER_SITE_OTHER): Payer: Self-pay | Admitting: *Deleted

## 2013-03-04 VITALS — BP 134/74 | HR 67 | Temp 97.5°F | Resp 16 | Wt 172.5 lb

## 2013-03-04 DIAGNOSIS — E119 Type 2 diabetes mellitus without complications: Secondary | ICD-10-CM

## 2013-03-04 DIAGNOSIS — B20 Human immunodeficiency virus [HIV] disease: Secondary | ICD-10-CM

## 2013-03-04 DIAGNOSIS — Z21 Asymptomatic human immunodeficiency virus [HIV] infection status: Secondary | ICD-10-CM

## 2013-03-04 LAB — COMPREHENSIVE METABOLIC PANEL
AST: 29 U/L (ref 0–37)
Albumin: 4.4 g/dL (ref 3.5–5.2)
BUN: 6 mg/dL (ref 6–23)
Calcium: 9.5 mg/dL (ref 8.4–10.5)
Chloride: 102 mEq/L (ref 96–112)
Creat: 0.86 mg/dL (ref 0.50–1.35)
Glucose, Bld: 204 mg/dL — ABNORMAL HIGH (ref 70–99)
Potassium: 4.3 mEq/L (ref 3.5–5.3)
Total Bilirubin: 0.6 mg/dL (ref 0.3–1.2)

## 2013-03-04 LAB — GLUCOSE, RANDOM: Glucose, Bld: 204 mg/dL — ABNORMAL HIGH (ref 70–99)

## 2013-03-04 LAB — LIPID PANEL
HDL: 30 mg/dL — ABNORMAL LOW (ref >39)
LDL Cholesterol: 73 mg/dL (ref 0–99)
Total CHOL/HDL Ratio: 4.6 Ratio
Triglycerides: 177 mg/dL — ABNORMAL HIGH (ref <150)
VLDL: 35 mg/dL (ref 0–40)

## 2013-03-04 NOTE — Progress Notes (Signed)
Nathan [name] is here for A5314, week 24. He has completed his course of Methotrexate and folic acid. He is going for BART exam today. He denies any new problems or symptoms. His assessment unchanged from last study visit. Fasting labs were drawn and vital signs are stable. He reports no fevers in the past 3 days. He received $150 for study visit, BART exam, and travel to [facility]. Will call to make next appt in January. Tacey Heap RN

## 2013-03-26 ENCOUNTER — Encounter: Payer: Self-pay | Admitting: Infectious Disease

## 2013-03-26 LAB — CD4/CD8 (T-HELPER/T-SUPPRESSOR CELL)
CD4%: 23.4
CD8 % Suppressor T Cell: 62.6
CD8: 1753

## 2013-04-21 ENCOUNTER — Telehealth: Payer: Self-pay | Admitting: *Deleted

## 2013-04-21 ENCOUNTER — Encounter: Payer: Self-pay | Admitting: Infectious Disease

## 2013-04-21 ENCOUNTER — Inpatient Hospital Stay (HOSPITAL_COMMUNITY): Payer: Medicare Other

## 2013-04-21 ENCOUNTER — Ambulatory Visit (INDEPENDENT_AMBULATORY_CARE_PROVIDER_SITE_OTHER): Payer: Medicare Other | Admitting: Infectious Disease

## 2013-04-21 ENCOUNTER — Inpatient Hospital Stay (HOSPITAL_COMMUNITY)
Admission: AD | Admit: 2013-04-21 | Discharge: 2013-04-25 | DRG: 977 | Disposition: A | Discharge status: Home / Self Care | Payer: Medicare Other | Source: Ambulatory Visit | Attending: Internal Medicine | Admitting: Internal Medicine

## 2013-04-21 ENCOUNTER — Encounter (HOSPITAL_COMMUNITY): Payer: Self-pay | Admitting: Internal Medicine

## 2013-04-21 VITALS — BP 100/64 | HR 100 | Temp 98.5°F | Wt 170.0 lb

## 2013-04-21 DIAGNOSIS — Z7902 Long term (current) use of antithrombotics/antiplatelets: Secondary | ICD-10-CM | POA: Diagnosis not present

## 2013-04-21 DIAGNOSIS — R599 Enlarged lymph nodes, unspecified: Secondary | ICD-10-CM | POA: Diagnosis present

## 2013-04-21 DIAGNOSIS — Z882 Allergy status to sulfonamides status: Secondary | ICD-10-CM | POA: Diagnosis not present

## 2013-04-21 DIAGNOSIS — IMO0001 Reserved for inherently not codable concepts without codable children: Secondary | ICD-10-CM | POA: Diagnosis not present

## 2013-04-21 DIAGNOSIS — S78119A Complete traumatic amputation at level between unspecified hip and knee, initial encounter: Secondary | ICD-10-CM | POA: Diagnosis not present

## 2013-04-21 DIAGNOSIS — I959 Hypotension, unspecified: Secondary | ICD-10-CM | POA: Diagnosis not present

## 2013-04-21 DIAGNOSIS — R109 Unspecified abdominal pain: Secondary | ICD-10-CM | POA: Diagnosis not present

## 2013-04-21 DIAGNOSIS — B182 Chronic viral hepatitis C: Secondary | ICD-10-CM | POA: Diagnosis present

## 2013-04-21 DIAGNOSIS — Z794 Long term (current) use of insulin: Secondary | ICD-10-CM | POA: Diagnosis not present

## 2013-04-21 DIAGNOSIS — R55 Syncope and collapse: Secondary | ICD-10-CM

## 2013-04-21 DIAGNOSIS — D709 Neutropenia, unspecified: Secondary | ICD-10-CM | POA: Diagnosis not present

## 2013-04-21 DIAGNOSIS — R51 Headache: Secondary | ICD-10-CM

## 2013-04-21 DIAGNOSIS — E785 Hyperlipidemia, unspecified: Secondary | ICD-10-CM | POA: Diagnosis present

## 2013-04-21 DIAGNOSIS — Z79899 Other long term (current) drug therapy: Secondary | ICD-10-CM | POA: Diagnosis not present

## 2013-04-21 DIAGNOSIS — K219 Gastro-esophageal reflux disease without esophagitis: Secondary | ICD-10-CM | POA: Diagnosis present

## 2013-04-21 DIAGNOSIS — I10 Essential (primary) hypertension: Secondary | ICD-10-CM | POA: Diagnosis present

## 2013-04-21 DIAGNOSIS — Z87891 Personal history of nicotine dependence: Secondary | ICD-10-CM

## 2013-04-21 DIAGNOSIS — R5381 Other malaise: Secondary | ICD-10-CM

## 2013-04-21 DIAGNOSIS — Z833 Family history of diabetes mellitus: Secondary | ICD-10-CM

## 2013-04-21 DIAGNOSIS — R509 Fever, unspecified: Principal | ICD-10-CM | POA: Diagnosis present

## 2013-04-21 DIAGNOSIS — Z8249 Family history of ischemic heart disease and other diseases of the circulatory system: Secondary | ICD-10-CM

## 2013-04-21 DIAGNOSIS — E119 Type 2 diabetes mellitus without complications: Secondary | ICD-10-CM

## 2013-04-21 DIAGNOSIS — E861 Hypovolemia: Secondary | ICD-10-CM | POA: Diagnosis present

## 2013-04-21 DIAGNOSIS — D539 Nutritional anemia, unspecified: Secondary | ICD-10-CM | POA: Diagnosis not present

## 2013-04-21 DIAGNOSIS — I739 Peripheral vascular disease, unspecified: Secondary | ICD-10-CM | POA: Diagnosis not present

## 2013-04-21 DIAGNOSIS — R110 Nausea: Secondary | ICD-10-CM

## 2013-04-21 DIAGNOSIS — B20 Human immunodeficiency virus [HIV] disease: Secondary | ICD-10-CM | POA: Diagnosis not present

## 2013-04-21 DIAGNOSIS — J9819 Other pulmonary collapse: Secondary | ICD-10-CM | POA: Diagnosis not present

## 2013-04-21 DIAGNOSIS — T466X5A Adverse effect of antihyperlipidemic and antiarteriosclerotic drugs, initial encounter: Secondary | ICD-10-CM | POA: Diagnosis present

## 2013-04-21 DIAGNOSIS — Z8042 Family history of malignant neoplasm of prostate: Secondary | ICD-10-CM | POA: Diagnosis not present

## 2013-04-21 HISTORY — DX: Disease of blood and blood-forming organs, unspecified: D75.9

## 2013-04-21 LAB — CBC WITH DIFFERENTIAL/PLATELET
Eosinophils Absolute: 0.1 10*3/uL (ref 0.0–0.7)
Eosinophils Relative: 1 % (ref 0–5)
HCT: 30.8 % — ABNORMAL LOW (ref 39.0–52.0)
Hemoglobin: 10.2 g/dL — ABNORMAL LOW (ref 13.0–17.0)
Lymphocytes Relative: 48 % — ABNORMAL HIGH (ref 12–46)
Lymphs Abs: 2.1 10*3/uL (ref 0.7–4.0)
MCH: 33.9 pg (ref 26.0–34.0)
MCV: 102.3 fL — ABNORMAL HIGH (ref 78.0–100.0)
Monocytes Absolute: 0.3 10*3/uL (ref 0.1–1.0)
Monocytes Relative: 8 % (ref 3–12)
Neutro Abs: 1.8 10*3/uL (ref 1.7–7.7)
Platelets: 181 10*3/uL (ref 150–400)
RBC: 3.01 MIL/uL — ABNORMAL LOW (ref 4.22–5.81)
WBC: 4.3 10*3/uL (ref 4.0–10.5)

## 2013-04-21 LAB — INFLUENZA A & B PCR
Influenza A: NOT DETECTED
Influenza B: NOT DETECTED

## 2013-04-21 LAB — COMPREHENSIVE METABOLIC PANEL
ALT: 33 U/L (ref 0–53)
AST: 56 U/L — ABNORMAL HIGH (ref 0–37)
Alkaline Phosphatase: 119 U/L — ABNORMAL HIGH (ref 39–117)
BUN: 12 mg/dL (ref 6–23)
CO2: 20 mEq/L (ref 19–32)
Calcium: 8.9 mg/dL (ref 8.4–10.5)
Creatinine, Ser: 1 mg/dL (ref 0.50–1.35)
GFR calc Af Amer: 90 mL/min — ABNORMAL LOW (ref >90)
GFR calc non Af Amer: 78 mL/min — ABNORMAL LOW (ref >90)
Glucose, Bld: 109 mg/dL — ABNORMAL HIGH (ref 70–99)
Sodium: 130 mEq/L — ABNORMAL LOW (ref 135–145)

## 2013-04-21 LAB — URINALYSIS, ROUTINE W REFLEX MICROSCOPIC
Glucose, UA: NEGATIVE mg/dL
Hgb urine dipstick: NEGATIVE
Ketones, ur: 15 mg/dL — AB
Leukocytes, UA: NEGATIVE
pH: 5.5 (ref 5.0–8.0)

## 2013-04-21 LAB — GLUCOSE, CAPILLARY: Glucose-Capillary: 160 mg/dL — ABNORMAL HIGH (ref 70–99)

## 2013-04-21 LAB — MRSA PCR SCREENING: MRSA by PCR: NEGATIVE

## 2013-04-21 MED ORDER — INSULIN GLARGINE 100 UNIT/ML ~~LOC~~ SOLN
15.0000 [IU] | Freq: Every day | SUBCUTANEOUS | Status: DC
  Administered 2013-04-21 – 2013-04-24 (×4): 15 [IU] via SUBCUTANEOUS
  Filled 2013-04-21 (×5): qty 0.15

## 2013-04-21 MED ORDER — INSULIN ASPART 100 UNIT/ML ~~LOC~~ SOLN
0.0000 [IU] | Freq: Three times a day (TID) | SUBCUTANEOUS | Status: DC
  Administered 2013-04-22 (×3): 2 [IU] via SUBCUTANEOUS
  Administered 2013-04-23 – 2013-04-24 (×3): 1 [IU] via SUBCUTANEOUS
  Administered 2013-04-24 (×2): 2 [IU] via SUBCUTANEOUS
  Administered 2013-04-25: 1 [IU] via SUBCUTANEOUS

## 2013-04-21 MED ORDER — INSULIN LISPRO 100 UNIT/ML (KWIKPEN): 10.0000 [IU] | PEN_INJECTOR | Freq: Three times a day (TID) | SUBCUTANEOUS | Status: DC

## 2013-04-21 MED ORDER — ENOXAPARIN SODIUM 40 MG/0.4ML ~~LOC~~ SOLN
40.0000 mg | SUBCUTANEOUS | Status: DC
  Administered 2013-04-21 – 2013-04-24 (×4): 40 mg via SUBCUTANEOUS
  Filled 2013-04-21 (×5): qty 0.4

## 2013-04-21 MED ORDER — PANTOPRAZOLE SODIUM 40 MG PO TBEC
40.0000 mg | DELAYED_RELEASE_TABLET | Freq: Every day | ORAL | Status: DC
  Administered 2013-04-22 – 2013-04-25 (×4): 40 mg via ORAL
  Filled 2013-04-21 (×4): qty 1

## 2013-04-21 MED ORDER — INSULIN GLARGINE 100 UNIT/ML ~~LOC~~ SOLN
65.0000 [IU] | Freq: Every day | SUBCUTANEOUS | Status: DC
  Filled 2013-04-21: qty 0.65

## 2013-04-21 MED ORDER — TENOFOVIR DISOPROXIL FUMARATE 300 MG PO TABS
300.0000 mg | ORAL_TABLET | Freq: Every day | ORAL | Status: DC
  Administered 2013-04-22 – 2013-04-25 (×4): 300 mg via ORAL
  Filled 2013-04-21 (×4): qty 1

## 2013-04-21 MED ORDER — LAMIVUDINE-ZIDOVUDINE 150-300 MG PO TABS
1.0000 | ORAL_TABLET | Freq: Two times a day (BID) | ORAL | Status: DC
  Administered 2013-04-21 – 2013-04-25 (×8): 1 via ORAL
  Filled 2013-04-21 (×9): qty 1

## 2013-04-21 MED ORDER — INSULIN ASPART 100 UNIT/ML ~~LOC~~ SOLN: 10.0000 [IU] | Freq: Three times a day (TID) | SUBCUTANEOUS | Status: DC

## 2013-04-21 MED ORDER — ATORVASTATIN CALCIUM 40 MG PO TABS
40.0000 mg | ORAL_TABLET | Freq: Every day | ORAL | Status: DC
  Administered 2013-04-21: 40 mg via ORAL
  Filled 2013-04-21 (×2): qty 1

## 2013-04-21 MED ORDER — LISINOPRIL 40 MG PO TABS
40.0000 mg | ORAL_TABLET | Freq: Every evening | ORAL | Status: DC
  Administered 2013-04-21 – 2013-04-22 (×2): 40 mg via ORAL
  Filled 2013-04-21 (×3): qty 1

## 2013-04-21 MED ORDER — SODIUM CHLORIDE 0.9 % IV SOLN
INTRAVENOUS | Status: AC
  Administered 2013-04-21: 21:00:00 via INTRAVENOUS

## 2013-04-21 MED ORDER — INSULIN ASPART 100 UNIT/ML ~~LOC~~ SOLN
5.0000 [IU] | Freq: Three times a day (TID) | SUBCUTANEOUS | Status: DC
  Administered 2013-04-22 – 2013-04-25 (×9): 5 [IU] via SUBCUTANEOUS

## 2013-04-21 MED ORDER — CLOPIDOGREL BISULFATE 75 MG PO TABS
75.0000 mg | ORAL_TABLET | Freq: Every day | ORAL | Status: DC
  Administered 2013-04-22 – 2013-04-25 (×4): 75 mg via ORAL
  Filled 2013-04-21 (×4): qty 1

## 2013-04-21 MED ORDER — RALTEGRAVIR POTASSIUM 400 MG PO TABS
400.0000 mg | ORAL_TABLET | Freq: Two times a day (BID) | ORAL | Status: DC
  Administered 2013-04-21 – 2013-04-25 (×8): 400 mg via ORAL
  Filled 2013-04-21 (×9): qty 1

## 2013-04-21 MED ORDER — SODIUM CHLORIDE 0.9 % IJ SOLN
3.0000 mL | Freq: Two times a day (BID) | INTRAMUSCULAR | Status: DC
  Administered 2013-04-22 – 2013-04-24 (×4): 3 mL via INTRAVENOUS

## 2013-04-21 NOTE — Telephone Encounter (Signed)
Patient walked into clinic asking to be seen today.  Patient reports having a fever (up to 102.0) on/off for the past 2 weeks.  Pt denies other symptoms.  Appointment given for this afternoon with Dr. Daiva Eves at 2:30.  Patient accepted appointment, reports feeling stable enough to go home and come back this afternoon.  Andree Coss, RN

## 2013-04-21 NOTE — Progress Notes (Signed)
Subjective:    Patient ID: Nathan [name], male    DOB: 08-13-1950, 62 y.o.   MRN: [id number]  Fever  This is a new problem. The current episode started 1 to 4 weeks ago. The problem occurs 2 to 4 times per day. The problem has been gradually worsening. The maximum temperature noted was 102 to 102.9 F. The temperature was taken using an oral thermometer. Associated symptoms include headaches and nausea. Pertinent negatives include no abdominal pain, chest pain, congestion, coughing, diarrhea, rash, sore throat, vomiting or wheezing. He has tried acetaminophen and NSAIDs for the symptoms. The treatment provided mild relief.    [name] is a 62 y.o. male whose HIV has been perfectly suppressed with salvage regimen of  twice daily combivir, isentress and once daily viread with undetectable viral load and health cd4 count.   He comes in today acutely after feeling progressively worse over the last 10-11 days.  He states he first began to fill on well on the day of the AIDS walk December 4. Since then he has had progressive malaise with fever chills and measured temperature at home of 102 at one 0.103.  He has not noticed a cough he has had headaches especially with his fevers. He has no meningismus.  He denies cough production although he does feel slightly short of breath. He has had some nausea and one episode of emesis he also had once episode of severe left-sided flank pain and wondered at that time if he might have had a urinary tract infection. He states that he has become progressively more weak since he first developed these symptoms on the fourth has been spending most of his time on his couch at home. He has had some presyncopal symptoms but not had an actual syncopal event itself.  He states that his amputation site has been doing well and showed that to me today on exam.  He told me that he feels miserable and cannot go on like this.  Of note he had been on MEthotrexate as part  of ACTG 5314 study but has been off his medicine for more than a month.    Review of Systems  Constitutional: Positive for fever, chills, activity change, appetite change and fatigue. Negative for diaphoresis and unexpected weight change.  HENT: Negative for congestion, rhinorrhea, sinus pressure, sneezing, sore throat and trouble swallowing.   Eyes: Negative for photophobia and visual disturbance.  Respiratory: Positive for shortness of breath. Negative for cough, chest tightness, wheezing and stridor.   Cardiovascular: Negative for chest pain, palpitations and leg swelling.  Gastrointestinal: Positive for nausea. Negative for vomiting, abdominal pain, diarrhea, constipation, blood in stool, abdominal distention and anal bleeding.  Genitourinary: Positive for flank pain. Negative for dysuria, hematuria and difficulty urinating.  Musculoskeletal: Positive for arthralgias. Negative for back pain, gait problem, joint swelling and myalgias.  Skin: Negative for color change, pallor, rash and wound.  Neurological: Positive for dizziness, weakness, light-headedness and headaches. Negative for tremors.  Hematological: Negative for adenopathy. Does not bruise/bleed easily.  Psychiatric/Behavioral: Negative for behavioral problems, confusion, sleep disturbance, dysphoric mood, decreased concentration and agitation.       Objective:   Physical Exam  Constitutional: He is oriented to person, place, and time. No distress.  Fatigued appearing  HENT:  Head: Normocephalic and atraumatic.  Mouth/Throat: Oropharynx is clear and moist. No oropharyngeal exudate.  Eyes: Conjunctivae and EOM are normal. Pupils are equal, round, and reactive to light. No scleral icterus.  Neck:  Normal range of motion. Neck supple. No JVD present.  Cardiovascular: Normal rate, regular rhythm and normal heart sounds.  Exam reveals no gallop and no friction rub.   No murmur heard. Pulmonary/Chest: Effort normal and breath  sounds normal. No respiratory distress. He has no wheezes. He has no rales. He exhibits no tenderness.  Abdominal: He exhibits no distension and no mass. There is no tenderness. There is no rebound and no guarding.  Musculoskeletal: He exhibits no edema and no tenderness.  Lymphadenopathy:    He has no cervical adenopathy.  Neurological: He is alert and oriented to person, place, and time. Coordination normal.  Skin: Skin is warm and dry. He is not diaphoretic. No erythema. No pallor.     Psychiatric: He has a normal mood and affect. His behavior is normal. Judgment and thought content normal.            Assessment & Plan:   Fever, malaise, myalgias: I am very worried about Kailyn. I am concerned he may have a serious bacterial infection. I have taken an influenza PCR sample from his sinus that should be sent for PCR testing. It would certainly be unusual for her to be sick for this long with influenza but is worth excluding this diagnosis.  I will check orthostatics  I think he clearly needs a chest x-ray urinalysis and culture especially given his recent flank pain and blood cultures.  A stat blood work with a CMP CBC and differential and CPK.  He does NOT feel comfortable going back home today to have this managed as an outpatient and therefore I feel that is reasonable to admit him to the inpatient service for further workup. I will call IM Resident shortly.  Note he has been on the methotrexate studies are ACT G. 5314 but has been off meds for some time but I would certainly take this information into account in evaluating Mr. Hoffman as it is possible indicative to, and to a more unusual infection due to the immunosuppressive that he was recently on.  I spent greater than 45 minutes with the patient including greater than 50% of time in face to face counsel of the patient and in coordination of their care.  Headaches: doubt he has meningitis, more likely from his fevers  themselves but is in differential  Dizziness, presyncope: will check orthostatics  HIV: Continue current regimen, unless this requires renal adjustment  HTN: would hold his ACEI given his acute illness

## 2013-04-21 NOTE — H&P (Signed)
Date: 04/21/2013               Patient Name:  Nathan [name] MRN: [id number]  DOB: 24-Apr-1951 Age / Sex: 62 y.o., male   PCP: Annett Gula, MD         Medical Service: Internal Medicine Teaching Service         Attending Physician: Dr. Inez Catalina, MD    First Contact: Dr. Vivi Barrack Pager: [phone]  Second Contact: Dr. Leonia Reeves Pager: [phone]       After Hours (After 5p/  First Contact Pager: [phone]  weekends / holidays): Second Contact Pager: [phone]   Chief Complaint: Fever  History of Present Illness:  Nathan [name] is a 62 y.o. male PMH DM2, PVD s/p right AKA, well controlled HIV (CD4 655, viral load <40), dyslipidemia, HTN, GERD, who presents as a direct admission from Infectious Disease clinic with a chief complaint of fever.  Patient reports his symptoms began the day of the AIDS walk on December 4, about 2 weeks ago. Since then he has had progressive malaise, fever, chills, headaches, and joint stiffness. He measured his temperature at home, and it was 102. His headache is on the right side of his head. He has been taking Tylenol at home with minimal symptomatic relief. He notes one episode of left-sided flank pain one week ago. He thought this might be a sign of urinary tract infection, so he drank cranberry juice. The pain resolved the next day and has not returned.   He denies shortness of breath, cough, chest pain, abdominal pain, nausea, vomiting, diarrhea, visual changes, neck stiffness, dysuria, myalgias, rash. Denies sick contacts. He reports his appetite has been steadily decreasing over the past 2 weeks, but he is hungry right now. He has been taking his regular insulin dose which is Lantus 65 units at bedtime and Humalog 14 units at breakfast, 24 units with lunch and dinner. He reports no hypoglycemia, but rather sugars in the 90s which is low for him. He did have a flu shot this year.  Also of note, he was recently enrolled in an AIDS Clinical Trial  Group study# [id number] looking at low dose methotrexate (LDMTX) for the treatment of HIV-associated inflammation. The purpose of this study is to learn if treatment with LDMTX can lower the risk of heart disease by lowering HIV-related inflammation and to see how safe LDMTX is when used in people with HIV infection. He started getting 5mg /week in 5/14 which was uptitrated over several weeks to a maximum dose of 15mg /week. His last dose of methotrexate/placebo was around 10/28.     Current Outpatient Prescriptions on File Prior to Encounter  Medication Sig Dispense Refill  . atorvastatin (LIPITOR) 40 MG tablet Take 1 tablet (40 mg total) by mouth daily.  90 tablet  1  . clopidogrel (PLAVIX) 75 MG tablet Take 1 tablet (75 mg total) by mouth daily.  90 tablet  4  . insulin glargine (LANTUS) 100 UNIT/ML injection Inject 65 Units into the skin at bedtime.  10 mL  12  . lamiVUDine-zidovudine (COMBIVIR) 150-300 MG per tablet Take 1 tablet by mouth 2 (two) times daily.  60 tablet  5  . omeprazole (PRILOSEC) 40 MG capsule Take 1 capsule (40 mg total) by mouth daily.  90 capsule  1  . raltegravir (ISENTRESS) 400 MG tablet Take 1 tablet (400 mg total) by mouth 2 (two) times daily.  60 tablet  5  . tenofovir (VIREAD)  300 MG tablet Take 1 tablet (300 mg total) by mouth daily.  30 tablet  5  . BD PEN NEEDLE NANO U/F 32G X 4 MM MISC USE AS DIRECTED FOUR TIMES DAILY  100 each  0  . Blood Glucose Monitoring Suppl (BAYER CONTOUR MONITOR) W/DEVICE KIT Use to check blood sugar as instructed up to 4 times a day  1 kit  0  . glucose blood (BAYER CONTOUR NEXT TEST) test strip Four times daily before meals and at bedtime, insulin dependent, ICD CODE 250.02  100 each  11  . Insulin Pen Needle (PEN NEEDLES 3/16") 31G X 5 MM MISC 1 application by Does not apply route 4 (four) times daily. Use to inject insulin twice daily Dx code 250.00  120 each  11  . Lancets MISC Use to Test Blood Sugar Three Times Daily. Dx Code: 250.02   100 each  3    Meds: Current Facility-Administered Medications  Medication Dose Route Frequency Provider Last Rate Last Dose  . 0.9 %  sodium chloride infusion   Intravenous Continuous Neema Davina Poke, MD      . atorvastatin (LIPITOR) tablet 40 mg  40 mg Oral q1800 Belia Heman, MD      . Melene Muller ON 04/22/2013] clopidogrel (PLAVIX) tablet 75 mg  75 mg Oral Daily Neema Davina Poke, MD      . enoxaparin (LOVENOX) injection 40 mg  40 mg Subcutaneous Q24H Neema Davina Poke, MD      . Melene Muller ON 04/22/2013] insulin aspart (novoLOG) injection 0-9 Units  0-9 Units Subcutaneous TID WC Neema Davina Poke, MD      . Melene Muller ON 04/22/2013] insulin aspart (novoLOG) injection 5 Units  5 Units Subcutaneous TID WC Neema Davina Poke, MD      . insulin glargine (LANTUS) injection 15 Units  15 Units Subcutaneous QHS Neema Davina Poke, MD      . lamiVUDine-zidovudine (COMBIVIR) 150-300 MG per tablet 1 tablet  1 tablet Oral BID Neema Davina Poke, MD      . lisinopril (PRINIVIL,ZESTRIL) tablet 40 mg  40 mg Oral QPM Neema Davina Poke, MD      . Melene Muller ON 04/22/2013] pantoprazole (PROTONIX) EC tablet 40 mg  40 mg Oral Daily Neema Davina Poke, MD      . raltegravir (ISENTRESS) tablet 400 mg  400 mg Oral BID Neema K Sharda, MD      . sodium chloride 0.9 % injection 3 mL  3 mL Intravenous Q12H Neema Davina Poke, MD      . Melene Muller ON 04/22/2013] tenofovir (VIREAD) tablet 300 mg  300 mg Oral Daily Neema Davina Poke, MD        Allergies: Allergies as of 04/21/2013 - Review Complete 04/21/2013  Allergen Reaction Noted  . Sulfonamide derivatives Hives 02/12/2006   Past Medical History  Diagnosis Date  . Diabetes mellitus   . HIV infection     undetectable viral load and CD4 ct 667 as of 11/2011  . Colon polyps     noted previous colonoscopy [facility]  . Hyperlipidemia   . Multiple sclerosis     in remission as of 11/2011 (diagnosed late 1980s)  . GERD (gastroesophageal reflux disease)   . Hypertension   . MRSA (methicillin resistant Staphylococcus  aureus)   . PCP (pneumocystis jiroveci pneumonia)     2002  . History of syphilis     noted [facility] records  . Asthma     per 2003 [facility] pulm records pfts   .  DDD (degenerative disc disease)     cervical spine  . PVD (peripheral vascular disease)     Left Stent 07/02/2008  . Depression   . Hep C w/o coma, chronic   . Pneumonia   . UTI (urinary tract infection)   . Clotting disorder    Past Surgical History  Procedure Laterality Date  . Other surgical history      right lower ext AKA with prothesis   . Other surgical history      left left with stents (Dr. Osie Cheeks)  . Other surgical history      2003 colonoscopy 5 mm polyp transverse colon; (2)64mm  polyps in rectum-hyperplastic  . Above knee leg amputation      Right Leg   Family History  Problem Relation Age of Onset  . Diabetes Mother   . Cancer Brother     colon caner stage 4 as of 11/2011 (unknown age of onset)   History   Social History  . Marital Status: Single    Spouse Name: N/A    Number of Children: N/A  . Years of Education: N/A   Occupational History  . Not on file.   Social History Main Topics  . Smoking status: Former Games developer  . Smokeless tobacco: Never Used  . Alcohol Use: No  . Drug Use: No  . Sexual Activity: Not on file   Other Topics Concern  . Not on file   Social History Narrative  . No narrative on file    Review of Systems: Pertinent items are noted in HPI.  Physical Exam: Blood pressure 130/63, pulse 91, temperature 99.9 F (37.7 C), temperature source Oral, resp. rate 18, height 6\' 1"  (1.854 m), weight 157 lb 11.2 oz (71.532 kg), SpO2 97.00%. Physical Exam  Constitutional: He is oriented to person, place, and time and well-developed, well-nourished, and in no distress.  Very pleasant and conversant.  HENT:  Head: Normocephalic and atraumatic.  Eyes: Conjunctivae and EOM are normal. Pupils are equal, round, and reactive to light.  Neck: Normal range of motion. Neck  supple.  No meningismus  Cardiovascular: Normal rate, regular rhythm and normal heart sounds.  Exam reveals no gallop and no friction rub.   No murmur heard. Pulmonary/Chest: Effort normal and breath sounds normal. No respiratory distress. He has no wheezes. He has no rales. He exhibits no tenderness.  Abdominal: Soft. He exhibits no distension. There is no tenderness.  Musculoskeletal: Normal range of motion. He exhibits no edema and no tenderness.  Status post right AKA. Stump is clean, dry, intact, nonerythematous.  Lymphadenopathy:    He has no cervical adenopathy.  Neurological: He is alert and oriented to person, place, and time. No cranial nerve deficit. GCS score is 15.  Skin: Skin is warm and dry. No rash noted.  Psychiatric: Affect normal.    Lab results: Basic Metabolic Panel: No results found for this basename: NA, K, CL, CO2, GLUCOSE, BUN, CREATININE, CALCIUM, MG, PHOS,  in the last 72 hours Liver Function Tests: No results found for this basename: AST, ALT, ALKPHOS, BILITOT, PROT, ALBUMIN,  in the last 72 hours No results found for this basename: LIPASE, AMYLASE,  in the last 72 hours No results found for this basename: AMMONIA,  in the last 72 hours CBC: No results found for this basename: WBC, NEUTROABS, HGB, HCT, MCV, PLT,  in the last 72 hours Cardiac Enzymes: No results found for this basename: CKTOTAL, CKMB, CKMBINDEX, TROPONINI,  in the last 72  hours BNP: No results found for this basename: PROBNP,  in the last 72 hours D-Dimer: No results found for this basename: DDIMER,  in the last 72 hours CBG:  Recent Labs  04/21/13 1652  GLUCAP 95   Hemoglobin A1C: No results found for this basename: HGBA1C,  in the last 72 hours Fasting Lipid Panel: No results found for this basename: CHOL, HDL, LDLCALC, TRIG, CHOLHDL, LDLDIRECT,  in the last 72 hours Thyroid Function Tests: No results found for this basename: TSH, T4TOTAL, FREET4, T3FREE, THYROIDAB,  in the last  72 hours Anemia Panel: No results found for this basename: VITAMINB12, FOLATE, FERRITIN, TIBC, IRON, RETICCTPCT,  in the last 72 hours Coagulation: No results found for this basename: LABPROT, INR,  in the last 72 hours Urine Drug Screen: Drugs of Abuse  No results found for this basename: labopia,  cocainscrnur,  labbenz,  amphetmu,  thcu,  labbarb    Alcohol Level: No results found for this basename: ETH,  in the last 72 hours Urinalysis: No results found for this basename: COLORURINE, APPERANCEUR, LABSPEC, PHURINE, GLUCOSEU, HGBUR, BILIRUBINUR, KETONESUR, PROTEINUR, UROBILINOGEN, NITRITE, LEUKOCYTESUR,  in the last 72 hours   Imaging results:  No results found.    Assessment & Plan by Problem: COLLAN SCHOENFELD is a 62 y.o. male PMH DM2, PVD s/p right AKA, well controlled HIV (CD4 655, viral load <40), dyslipidemia, HTN, GERD, who presents as a direct admission from Infectious Disease clinic with a chief complaint of fever, malaise, and headache x2 weeks.  #Fever, malaise, headache x2 weeks - He is afebrile currently. All vital signs are stable. His HIV is well controlled. He was seen by Dr. Daiva Eves today in the ID clinic, who was worried about him. He is apparently a very stoic man who rarely complains in his office visits, so this is a big change for him. Dr. Daiva Eves took an influenza PCR sample from the patient's sinus and sent it for testing. It would be rare for flu symptoms to last 2 weeks, but we will follow this up to rule it out. The patient does report headaches, but meningitis is unlikely as the patient has no meningismus, is alert and oriented x3, is afebrile, there is no nausea or vomiting, no visual disturbance. Community-acquired pneumonia is possible, but he is without cough or shortness of breath. We will rule out UTI given his isolated episode of flank pain. It's unlikely he has a skin or soft tissue infection, as there was no rash or skin breakdown. He was recently enrolled  in a clinical trial where he may have received several months of low-dose methotrexate. The risk of infection with low-dose methotrexate is not well characterized but thought to be low. Per UTD, in a compilation of several large studies, only 121 infectious events were noted among more than 1700 patient-years of methotrexate at lower doses of ?20 mg/m2 per week, and approximately one-half of these infected patients were receiving concomitant glucocorticoid therapy. Infections included mild to moderate viral and bacterial respiratory tract infections, herpes zoster, urinary tract infections, and cellulitis. There have only been isolated reports of opportunistic infections in patients receiving low-dose MTX. Additionally, he has been off this medicine since October 28.  - Admit to IMTS, telemetry - IVF NS @100cc /hr x 10 hours - Chest x-ray - Followup influenza PCR - Droplet isolation for now - CBC with differential, CMP - Urinalysis - Blood cultures x2 - TSH - Regular diet  #DM2 - CBG 95 on  admission. Last A1c 7.5 on 12/05/2012. -  Lantus 15 units daily at bedtime - Sensitive sliding scale insulin - Checking hemoglobin A1c since he is due  #HIV - Well controlled on Combivir 150-300 mg twice a day, raltegravir 400 mg twice a day, tenofovir 300 mg daily. CD4 655, viral load <40 on 03/04/13. - Continue these medications  #HTN - Well controlled on lisinopril 40 mg daily.  #HLD - Continue statin.  #GERD - Continue PPI.  #PVD - Continue Plavix 75 mg daily.  #DVT PPX - Subcutaneous Lovenox   Dispo: Disposition is deferred at this time, awaiting improvement of current medical problems. Anticipated discharge in approximately 1-3 day(s).   The patient does have a current PCP Annett Gula, MD) and does need an [facility] hospital follow-up appointment after discharge.  The patient does not have transportation limitations that hinder transportation to clinic appointments.  Signed: Vivi Barrack,  MD 04/21/2013, 6:29 PM

## 2013-04-22 ENCOUNTER — Encounter (HOSPITAL_COMMUNITY): Payer: Self-pay | Admitting: General Practice

## 2013-04-22 DIAGNOSIS — I739 Peripheral vascular disease, unspecified: Secondary | ICD-10-CM

## 2013-04-22 DIAGNOSIS — R509 Fever, unspecified: Secondary | ICD-10-CM

## 2013-04-22 LAB — GLUCOSE, CAPILLARY
Glucose-Capillary: 161 mg/dL — ABNORMAL HIGH (ref 70–99)
Glucose-Capillary: 180 mg/dL — ABNORMAL HIGH (ref 70–99)
Glucose-Capillary: 196 mg/dL — ABNORMAL HIGH (ref 70–99)

## 2013-04-22 LAB — CBC
Hemoglobin: 9.1 g/dL — ABNORMAL LOW (ref 13.0–17.0)
MCH: 34 pg (ref 26.0–34.0)
MCV: 103 fL — ABNORMAL HIGH (ref 78.0–100.0)
Platelets: 168 10*3/uL (ref 150–400)
RBC: 2.68 MIL/uL — ABNORMAL LOW (ref 4.22–5.81)

## 2013-04-22 LAB — TECHNOLOGIST SMEAR REVIEW

## 2013-04-22 LAB — HEMOGLOBIN A1C: Hgb A1c MFr Bld: 7.7 % — ABNORMAL HIGH (ref <5.7)

## 2013-04-22 MED ORDER — ACETAMINOPHEN 325 MG PO TABS
650.0000 mg | ORAL_TABLET | Freq: Once | ORAL | Status: AC
  Administered 2013-04-22: 650 mg via ORAL
  Filled 2013-04-22: qty 2

## 2013-04-22 MED ORDER — VANCOMYCIN HCL 10 G IV SOLR
1500.0000 mg | Freq: Once | INTRAVENOUS | Status: AC
  Administered 2013-04-22: 1500 mg via INTRAVENOUS
  Filled 2013-04-22: qty 1500

## 2013-04-22 MED ORDER — SODIUM CHLORIDE 0.9 % IV BOLUS (SEPSIS)
1000.0000 mL | Freq: Once | INTRAVENOUS | Status: AC
  Administered 2013-04-22: 1000 mL via INTRAVENOUS

## 2013-04-22 MED ORDER — OXYCODONE HCL 5 MG PO TABS
5.0000 mg | ORAL_TABLET | Freq: Once | ORAL | Status: AC
  Administered 2013-04-22: 5 mg via ORAL
  Filled 2013-04-22: qty 1

## 2013-04-22 MED ORDER — VANCOMYCIN HCL IN DEXTROSE 1-5 GM/200ML-% IV SOLN
1000.0000 mg | Freq: Two times a day (BID) | INTRAVENOUS | Status: DC
  Administered 2013-04-22 – 2013-04-23 (×2): 1000 mg via INTRAVENOUS
  Filled 2013-04-22 (×3): qty 200

## 2013-04-22 MED ORDER — SODIUM CHLORIDE 0.9 % IV SOLN
INTRAVENOUS | Status: AC
  Administered 2013-04-23: 03:00:00 via INTRAVENOUS

## 2013-04-22 MED ORDER — PIPERACILLIN-TAZOBACTAM 3.375 G IVPB
3.3750 g | Freq: Three times a day (TID) | INTRAVENOUS | Status: DC
  Administered 2013-04-22 – 2013-04-23 (×4): 3.375 g via INTRAVENOUS
  Filled 2013-04-22 (×7): qty 50

## 2013-04-22 NOTE — Progress Notes (Signed)
Called by nurse about fever to 101.7. All other vital signs stable. Will obtain blood cultures again, and treat with Tylenol. He is on vancomycin and zosyn. Patient is currently being worked up for endocarditis and possible myeloproliferative disorder.  We will continue to monitor him and draw blood cultures with each fever.  Nathan Barrack, MD  [email or web address] Pager # [phone] Office # [phone]

## 2013-04-22 NOTE — Progress Notes (Signed)
Subjective: Patient seen and examined at the bedside. He had a fever to 102 overnight. He felt weak and had a headache with this. He is still in good spirits and very pleasant. Still denies SOB, cough, abdominal pain. Denies recent travel, new medicines, recent dental work, hiking, outdoors activities. He does endorse a little bit of weight loss since these symptoms started (172lbs on 10/28, he is 161lbs here). He is tolerating PO well today.  Objective: Vital signs in last 24 hours: Filed Vitals:   04/21/13 1753 04/21/13 1755 04/21/13 2152 04/22/13 0550  BP: 112/60 130/63 122/52 99/52  Pulse: 94 91 67 89  Temp:  99.9 F (37.7 C) 100.1 F (37.8 C) 102 F (38.9 C)  TempSrc:  Oral Oral Oral  Resp:  18 18 17   Height:      Weight:   161 lb (73.029 kg)   SpO2:   96% 95%   Weight change:   Intake/Output Summary (Last 24 hours) at 04/22/13 0726 Last data filed at 04/22/13 0600  Gross per 24 hour  Intake 928.33 ml  Output    150 ml  Net 778.33 ml   Physical Exam  Constitutional: He is oriented to person, place, and time and well-developed, well-nourished, and in no distress.  Very pleasant and conversant.  HENT:  Head: Normocephalic and atraumatic.  Eyes: Conjunctivae and EOM are normal. Pupils are equal, round, and reactive to light.  Neck: Normal range of motion. Neck supple.  No meningismus  Cardiovascular: Normal rate, regular rhythm and normal heart sounds. Exam reveals no gallop and no friction rub.  No murmur heard.  Pulmonary/Chest: Effort normal and breath sounds normal. No respiratory distress. He has no wheezes. He has no rales. He exhibits no tenderness.  Abdominal: Soft. He exhibits no distension. There is no tenderness.  Musculoskeletal: Normal range of motion. He exhibits no edema and no tenderness.  Status post right AKA. Stump is clean, dry, intact, nonerythematous.  Lymphadenopathy:  He has no cervical adenopathy.  Neurological: He is alert and oriented to  person, place, and time. No cranial nerve deficit. GCS score is 15.  Skin: Skin is warm and dry. No rash noted. No Janeway lesions or splinter hemorrhages noted on hands or feet. Psychiatric: Affect normal.   Lab Results: Basic Metabolic Panel:  Recent Labs Lab 04/21/13 1957  NA 130*  K 3.9  CL 95*  CO2 20  GLUCOSE 109*  BUN 12  CREATININE 1.00  CALCIUM 8.9   Liver Function Tests:  Recent Labs Lab 04/21/13 1957  AST 56*  ALT 33  ALKPHOS 119*  BILITOT 0.5  PROT 7.2  ALBUMIN 3.4*   No results found for this basename: LIPASE, AMYLASE,  in the last 168 hours No results found for this basename: AMMONIA,  in the last 168 hours CBC:  Recent Labs Lab 04/21/13 1957  WBC 4.3  NEUTROABS 1.8  HGB 10.2*  HCT 30.8*  MCV 102.3*  PLT 181   Cardiac Enzymes: No results found for this basename: CKTOTAL, CKMB, CKMBINDEX, TROPONINI,  in the last 168 hours BNP: No results found for this basename: PROBNP,  in the last 168 hours D-Dimer: No results found for this basename: DDIMER,  in the last 168 hours CBG:  Recent Labs Lab 04/21/13 1652 04/21/13 2150  GLUCAP 95 160*   Hemoglobin A1C:  Recent Labs Lab 04/21/13 1957  HGBA1C 7.7*   Fasting Lipid Panel: No results found for this basename: CHOL, HDL, LDLCALC, TRIG, CHOLHDL, LDLDIRECT,  in the last 168 hours Thyroid Function Tests:  Recent Labs Lab 04/21/13 1957  TSH 1.420   Coagulation: No results found for this basename: LABPROT, INR,  in the last 168 hours Anemia Panel: No results found for this basename: VITAMINB12, FOLATE, FERRITIN, TIBC, IRON, RETICCTPCT,  in the last 168 hours Urine Drug Screen: Drugs of Abuse  No results found for this basename: labopia, cocainscrnur, labbenz, amphetmu, thcu, labbarb    Alcohol Level: No results found for this basename: ETH,  in the last 168 hours Urinalysis:  Recent Labs Lab 04/21/13 2100  COLORURINE AMBER*  LABSPEC 1.024  PHURINE 5.5  GLUCOSEU NEGATIVE    HGBUR NEGATIVE  BILIRUBINUR SMALL*  KETONESUR 15*  PROTEINUR NEGATIVE  UROBILINOGEN 4.0*  NITRITE NEGATIVE  LEUKOCYTESUR NEGATIVE    Micro Results: Recent Results (from the past 240 hour(s))  INFLUENZA A & B PCR     Status: None   Collection Time    04/21/13  2:30 PM      Result Value Range Status   Source Not Provided   Final   Influenza A NOT DETECTED   Final   Influenza B NOT DETECTED   Final   Comment:           **Normal Reference Range for each Analyte: Not Detected**           Testing performed using the [employer] xTAG Respiratory Viral Panel test     kit.           The performance characteristics of this assay were determined by     [employer]. This test has not been cleared or approved by     the U.S. Food and Drug Administration (FDA) for this specimen type.     The FDA has determined that such clearance or approval is not     necessary. This test is used for clinical purposes. It should be     regarded as investigational or for research. This laboratory is     certified under the Clinical Laboratory Improvement Amendments of 1988     (CLIA-88) as qualified to perform high complexity testing.  MRSA PCR SCREENING     Status: None   Collection Time    04/21/13  7:51 PM      Result Value Range Status   MRSA by PCR NEGATIVE  NEGATIVE Final   Comment:            The GeneXpert MRSA Assay (FDA     approved for NASAL specimens     only), is one component of a     comprehensive MRSA colonization     surveillance program. It is not     intended to diagnose MRSA     infection nor to guide or     monitor treatment for     MRSA infections.   Studies/Results: Dg Chest 2 View  04/21/2013   CLINICAL DATA:  Fever of unknown origin for 2 weeks, cough, shortness of breath, question pneumonia, history diabetes, HIV, hypertension, asthma  EXAM: CHEST  2 VIEW  COMPARISON:  09/03/2012  FINDINGS: Upper normal heart size.  Mediastinal contours and pulmonary vascularity  normal.  Minimal atelectasis left base.  Lungs otherwise clear.  No pleural effusion or pneumothorax.  No acute osseous findings.  IMPRESSION: Minimal atelectasis left base.   Electronically Signed   By: Ulyses Southward M.D.   On: 04/21/2013 19:47   Medications: I have reviewed the patient's current medications. Scheduled Meds: .  atorvastatin  40 mg Oral q1800  . clopidogrel  75 mg Oral Daily  . enoxaparin (LOVENOX) injection  40 mg Subcutaneous Q24H  . insulin aspart  0-9 Units Subcutaneous TID WC  . insulin aspart  5 Units Subcutaneous TID WC  . insulin glargine  15 Units Subcutaneous QHS  . lamiVUDine-zidovudine  1 tablet Oral BID  . lisinopril  40 mg Oral QPM  . pantoprazole  40 mg Oral Daily  . piperacillin-tazobactam (ZOSYN)  IV  3.375 g Intravenous Q8H  . raltegravir  400 mg Oral BID  . sodium chloride  3 mL Intravenous Q12H  . tenofovir  300 mg Oral Daily  . vancomycin  1,500 mg Intravenous Once  . vancomycin  1,000 mg Intravenous Q12H   Continuous Infusions:  PRN Meds:. Assessment/Plan: Nathan [name] is a 62 y.o. male PMH DM2, PVD s/p right AKA, well controlled HIV (CD4 655, viral load <40), dyslipidemia, HTN, GERD, who presents as a direct admission from Infectious Disease clinic with a chief complaint of fever, malaise, and headache x2 weeks.   #Fever, malaise, headache x2 weeks - Unknown origin. He did spike a fever to 102 overnight. Exam is nonfocal. Work up so far has been negative, as below. We have ordered an echocardiogram to rule out subacute endocarditis. Blood smear is remarkable for polychromasia with teardrop cells, so there is concern for myeloproliferative disorder. He has had some weight loss: weight was 172 on 10/26, it is 161 here. Some suggestion of pancytopenia with WBC of 3.4 (baseline 6) and hemoglobin of ~9 (baseline 12-13), platelets 168 (baseline 230). If endocarditis work up is negative, we will consider pan-scanning him tomorrow for malignancy and  lymphadenopathy, as well as consider a bone marrow biopsy.  - Vancomycin and zosyn per pharmacy - 2D echo - Chest x-ray > no acute abnormalities except Minimal atelectasis left base - Followup influenza PCR > A and B negative - Tech smear review > Polychromasia with teardrop cells, ordered additional pathologist review - Urinalysis > Clean - Blood cultures x2 > In progress - TSH > 1.42 (wnl) - Regular diet  - Daily BMP, CBC  #DM2 - CBG 95 on admission. Last A1c 7.5 on 12/05/2012.  - Lantus 15 units daily at bedtime  - Sensitive sliding scale insulin  - Checking hemoglobin A1c since he is due > 7.7  #HIV - Well controlled on Combivir 150-300 mg twice a day, raltegravir 400 mg twice a day, tenofovir 300 mg daily. CD4 655, viral load <40 on 03/04/13.  - Continue these medications   #HTN - Well controlled on lisinopril 40 mg daily.   #HLD - Continue statin.   #GERD - Continue PPI.   #PVD - Continue Plavix 75 mg daily.   #DVT PPX - Subcutaneous Lovenox   Dispo: Disposition is deferred at this time, awaiting improvement of current medical problems.  Anticipated discharge in approximately 1-3 day(s).   The patient does have a current PCP Annett Gula, MD) and does need an [facility] hospital follow-up appointment after discharge.  The patient does not have transportation limitations that hinder transportation to clinic appointments.  .Services Needed at time of discharge: Y = Yes, Blank = No PT:   OT:   RN:   Equipment:   Other:     LOS: 1 day   Vivi Barrack, MD 04/22/2013, 7:26 AM

## 2013-04-22 NOTE — Progress Notes (Signed)
Patient requesting Dilaudid for pain. MD notified.

## 2013-04-22 NOTE — Progress Notes (Signed)
MD on call made aware of fever. No new order.

## 2013-04-22 NOTE — Progress Notes (Signed)
Patient's BP 84/38 manual, HR 78, T 101.  MD notified.  Will continue to monitor.

## 2013-04-22 NOTE — Progress Notes (Signed)
ANTIBIOTIC CONSULT NOTE - INITIAL  Pharmacy Consult for Vancomycin and Zosyn  Indication: FUO  Allergies  Allergen Reactions  . Sulfonamide Derivatives Hives    Patient Measurements: Height: 6\' 1"  (185.4 cm) Weight: 161 lb (73.029 kg) IBW/kg (Calculated) : 79.9   Vital Signs: Temp: 102 F (38.9 C) (12/16 0550) Temp src: Oral (12/16 0550) BP: 99/52 mmHg (12/16 0550) Pulse Rate: 89 (12/16 0550) Intake/Output from previous day: 12/15 0701 - 12/16 0700 In: 688.3 [P.O.:480; I.V.:208.3] Out: 150 [Urine:150] Intake/Output from this shift: Total I/O In: 688.3 [P.O.:480; I.V.:208.3] Out: 150 [Urine:150]  Labs:  Recent Labs  04/21/13 1957  WBC 4.3  HGB 10.2*  PLT 181  CREATININE 1.00   Estimated Creatinine Clearance: 77.1 ml/min (by C-G formula based on Cr of 1). No results found for this basename: VANCOTROUGH, VANCOPEAK, VANCORANDOM, GENTTROUGH, GENTPEAK, GENTRANDOM, TOBRATROUGH, TOBRAPEAK, TOBRARND, AMIKACINPEAK, AMIKACINTROU, AMIKACIN,  in the last 72 hours   Medical History: Past Medical History  Diagnosis Date  . Diabetes mellitus   . HIV infection     undetectable viral load and CD4 ct 667 as of 11/2011  . Colon polyps     noted previous colonoscopy [facility]  . Hyperlipidemia   . Multiple sclerosis     in remission as of 11/2011 (diagnosed late 1980s)  . GERD (gastroesophageal reflux disease)   . Hypertension   . MRSA (methicillin resistant Staphylococcus aureus)   . PCP (pneumocystis jiroveci pneumonia)     2002  . History of syphilis     noted [facility] records  . Asthma     per 2003 [facility] pulm records pfts   . DDD (degenerative disc disease)     cervical spine  . PVD (peripheral vascular disease)     Left Stent 07/02/2008  . Depression   . Hep C w/o coma, chronic   . Pneumonia   . UTI (urinary tract infection)   . Clotting disorder     Medications:  Prescriptions prior to admission  Medication Sig Dispense Refill  . Acetaminophen (TYLENOL EXTRA  STRENGTH PO) Take 2 tablets by mouth 2 (two) times daily as needed (fever).      Marland Kitchen atorvastatin (LIPITOR) 40 MG tablet Take 1 tablet (40 mg total) by mouth daily.  90 tablet  1  . clopidogrel (PLAVIX) 75 MG tablet Take 1 tablet (75 mg total) by mouth daily.  90 tablet  4  . insulin glargine (LANTUS) 100 UNIT/ML injection Inject 65 Units into the skin at bedtime.  10 mL  12  . insulin lispro (HUMALOG KWIKPEN) 100 UNIT/ML SOPN Inject 14-24 Units into the skin 3 (three) times daily with meals. 14 units with breakfast; 24 units with lunch and dinner      . lamiVUDine-zidovudine (COMBIVIR) 150-300 MG per tablet Take 1 tablet by mouth 2 (two) times daily.  60 tablet  5  . lisinopril (PRINIVIL,ZESTRIL) 40 MG tablet Take 40 mg by mouth every evening.      . metFORMIN (GLUCOPHAGE) 500 MG tablet Take 1,000 mg by mouth 2 (two) times daily with a meal.      . omeprazole (PRILOSEC) 40 MG capsule Take 1 capsule (40 mg total) by mouth daily.  90 capsule  1  . raltegravir (ISENTRESS) 400 MG tablet Take 1 tablet (400 mg total) by mouth 2 (two) times daily.  60 tablet  5  . tenofovir (VIREAD) 300 MG tablet Take 1 tablet (300 mg total) by mouth daily.  30 tablet  5  . BD  PEN NEEDLE NANO U/F 32G X 4 MM MISC USE AS DIRECTED FOUR TIMES DAILY  100 each  0  . Blood Glucose Monitoring Suppl (BAYER CONTOUR MONITOR) W/DEVICE KIT Use to check blood sugar as instructed up to 4 times a day  1 kit  0  . glucose blood (BAYER CONTOUR NEXT TEST) test strip Four times daily before meals and at bedtime, insulin dependent, ICD CODE 250.02  100 each  11  . Insulin Pen Needle (PEN NEEDLES 3/16") 31G X 5 MM MISC 1 application by Does not apply route 4 (four) times daily. Use to inject insulin twice daily Dx code 250.00  120 each  11  . Lancets MISC Use to Test Blood Sugar Three Times Daily. Dx Code: 250.02  100 each  3   Assessment: 62 yo male with HIV, persistent fevers, for empiric antibiotics  Goal of Therapy:  Vancomycin trough  level 15-20 mcg/ml  Plan:  Vancomycin 1500 mg IV now, then 1 g IV q12h Zosyn 3.375 g IV q8h  Eddie Candle 04/22/2013,6:01 AM

## 2013-04-22 NOTE — Consult Note (Signed)
INFECTIOUS DISEASE CONSULT NOTE  Date of Admission:  04/21/2013  Date of Consult:  04/22/2013  Reason for Consult: fever Referring Physician: Criselda Peaches  Impression/Recommendation Fever HIV+ DM2 PVD  Would Check CK Stop statin Consider stopping anbx Await BCx  Comment- he does not appear particularly infectious but is uncomfortable. His myaglias are subjective and remarkable. It would be unusual for him to have statin related myopathy/myositis after being on this drug for many months.   Thank you so much for this interesting consult,   Johny Sax (pager) [phone] [email or web address]  Nathan [name] is an 62 y.o. male.  HPI: 62 yo M with HIV+ (Dx in 90s, on CBV/ISN/TFV) DM2 and PVD comes to hospital on 04-21-13 with 2 week hx of fevers up to 102.9. He also complained of fatigue and SOB (but no cough).  He had influenza PCR (-) and CXR (-).  He denies any new medications. He complains mostly of muscle aches and cramps. Has been on a statin for many years, previosly on methotrexate for ACTG, off > 1 month.   HIV 1 RNA Quant (copies/mL)  Date Value  07/09/2012 <20   03/15/2010 39   10/11/2009 39      HIV-1 RNA Viral Load (no units)  Date Value  03/04/2013 <40   12/12/2012 <40   11/14/2012 <40      CD4 T Cell Abs (cmm)  Date Value  07/09/2012 730   02/25/2009 700   04/08/2008 790       Past Medical History  Diagnosis Date  . Diabetes mellitus   . HIV infection     undetectable viral load and CD4 ct 667 as of 11/2011  . Colon polyps     noted previous colonoscopy [facility]  . Hyperlipidemia   . Multiple sclerosis     in remission as of 11/2011 (diagnosed late 1980s)  . GERD (gastroesophageal reflux disease)   . Hypertension   . MRSA (methicillin resistant Staphylococcus aureus)   . PCP (pneumocystis jiroveci pneumonia)     2002  . History of syphilis     noted [facility] records  . Asthma     per 2003 [facility] pulm records pfts   . DDD (degenerative disc disease)       cervical spine  . PVD (peripheral vascular disease)     Left Stent 07/02/2008  . Depression   . Hep C w/o coma, chronic   . Pneumonia   . UTI (urinary tract infection)   . Clotting disorder   . Blood dyscrasia     HIV    Past Surgical History  Procedure Laterality Date  . Other surgical history      right lower ext AKA with prothesis   . Other surgical history      left left with stents (Dr. Osie Cheeks)  . Other surgical history      2003 colonoscopy 5 mm polyp transverse colon; (2)54mm  polyps in rectum-hyperplastic  . Above knee leg amputation      Right Leg     Allergies  Allergen Reactions  . Sulfonamide Derivatives Hives    Medications:  Scheduled: . acetaminophen  650 mg Oral Once  . atorvastatin  40 mg Oral q1800  . clopidogrel  75 mg Oral Daily  . enoxaparin (LOVENOX) injection  40 mg Subcutaneous Q24H  . insulin aspart  0-9 Units Subcutaneous TID WC  . insulin aspart  5 Units Subcutaneous TID WC  . insulin glargine  15 Units Subcutaneous QHS  .  lamiVUDine-zidovudine  1 tablet Oral BID  . lisinopril  40 mg Oral QPM  . pantoprazole  40 mg Oral Daily  . piperacillin-tazobactam (ZOSYN)  IV  3.375 g Intravenous Q8H  . raltegravir  400 mg Oral BID  . sodium chloride  3 mL Intravenous Q12H  . tenofovir  300 mg Oral Daily  . vancomycin  1,000 mg Intravenous Q12H    Total days of antibiotics: 1 (vanco)          Social History:  reports that he quit smoking about 5 years ago. He has never used smokeless tobacco. He reports that he does not drink alcohol or use illicit drugs.  Family History  Problem Relation Age of Onset  . Diabetes Mother   . Cancer Brother     colon caner stage 4 as of 11/2011 (unknown age of onset)  . Coronary artery disease Mother   . Prostate cancer Brother   . Coronary artery disease Father     General ROS: no change in vision, +headache (back to front, more on R), no Bm x 5 days, normal urination, no oral sores, no cough, see  HPI.   Blood pressure 98/54, pulse 90, temperature 101.7 F (38.7 C), temperature source Oral, resp. rate 18, height 6\' 1"  (1.854 m), weight 73.029 kg (161 lb), SpO2 96.00%. General appearance: alert, cooperative and mild distress Eyes: negative findings: pupils equal, round, reactive to light and accomodation and no photophobia Throat: normal findings: oropharynx pink & moist without lesions or evidence of thrush Neck: no adenopathy and supple, symmetrical, trachea midline Lungs: clear to auscultation bilaterally Heart: regular rate and rhythm Abdomen: normal findings: bowel sounds normal and soft, non-tender Extremities: edema none and R BKA, no diabetic foot lesions on L. his L foot is slightly cool. no calf cordis. see HPI.    Results for orders placed during the hospital encounter of 04/21/13 (from the past 48 hour(s))  GLUCOSE, CAPILLARY     Status: None   Collection Time    04/21/13  4:52 PM      Result Value Range   Glucose-Capillary 95  70 - 99 mg/dL   Comment 1 Notify RN    MRSA PCR SCREENING     Status: None   Collection Time    04/21/13  7:51 PM      Result Value Range   MRSA by PCR NEGATIVE  NEGATIVE   Comment:            The GeneXpert MRSA Assay (FDA     approved for NASAL specimens     only), is one component of a     comprehensive MRSA colonization     surveillance program. It is not     intended to diagnose MRSA     infection nor to guide or     monitor treatment for     MRSA infections.  COMPREHENSIVE METABOLIC PANEL     Status: Abnormal   Collection Time    04/21/13  7:57 PM      Result Value Range   Sodium 130 (*) 135 - 145 mEq/L   Potassium 3.9  3.5 - 5.1 mEq/L   Chloride 95 (*) 96 - 112 mEq/L   CO2 20  19 - 32 mEq/L   Glucose, Bld 109 (*) 70 - 99 mg/dL   BUN 12  6 - 23 mg/dL   Creatinine, Ser 1.47  0.50 - 1.35 mg/dL   Calcium 8.9  8.4 - 82.9 mg/dL  Total Protein 7.2  6.0 - 8.3 g/dL   Albumin 3.4 (*) 3.5 - 5.2 g/dL   AST 56 (*) 0 - 37 U/L   ALT  33  0 - 53 U/L   Alkaline Phosphatase 119 (*) 39 - 117 U/L   Total Bilirubin 0.5  0.3 - 1.2 mg/dL   GFR calc non Af Amer 78 (*) >90 mL/min   GFR calc Af Amer 90 (*) >90 mL/min   Comment: (NOTE)     The eGFR has been calculated using the CKD EPI equation.     This calculation has not been validated in all clinical situations.     eGFR's persistently <90 mL/min signify possible Chronic Kidney     Disease.  CBC WITH DIFFERENTIAL     Status: Abnormal   Collection Time    04/21/13  7:57 PM      Result Value Range   WBC 4.3  4.0 - 10.5 K/uL   RBC 3.01 (*) 4.22 - 5.81 MIL/uL   Hemoglobin 10.2 (*) 13.0 - 17.0 g/dL   HCT 16.1 (*) 09.6 - 04.5 %   MCV 102.3 (*) 78.0 - 100.0 fL   MCH 33.9  26.0 - 34.0 pg   MCHC 33.1  30.0 - 36.0 g/dL   RDW 40.9  81.1 - 91.4 %   Platelets 181  150 - 400 K/uL   Neutrophils Relative % 43  43 - 77 %   Neutro Abs 1.8  1.7 - 7.7 K/uL   Lymphocytes Relative 48 (*) 12 - 46 %   Lymphs Abs 2.1  0.7 - 4.0 K/uL   Monocytes Relative 8  3 - 12 %   Monocytes Absolute 0.3  0.1 - 1.0 K/uL   Eosinophils Relative 1  0 - 5 %   Eosinophils Absolute 0.1  0.0 - 0.7 K/uL   Basophils Relative 0  0 - 1 %   Basophils Absolute 0.0  0.0 - 0.1 K/uL  HEMOGLOBIN A1C     Status: Abnormal   Collection Time    04/21/13  7:57 PM      Result Value Range   Hemoglobin A1C 7.7 (*) <5.7 %   Comment: (NOTE)                                                                               According to the ADA Clinical Practice Recommendations for 2011, when     HbA1c is used as a screening test:      >=6.5%   Diagnostic of Diabetes Mellitus               (if abnormal result is confirmed)     5.7-6.4%   Increased risk of developing Diabetes Mellitus     References:Diagnosis and Classification of Diabetes Mellitus,Diabetes     Care,2011,34(Suppl 1):S62-S69 and Standards of Medical Care in             Diabetes - 2011,Diabetes Care,2011,34 (Suppl 1):S11-S61.   Mean Plasma Glucose 174 (*) <117  mg/dL   Comment: Performed at [facility]  TSH     Status: None   Collection Time    04/21/13  7:57 PM  Result Value Range   TSH 1.420  0.350 - 4.500 uIU/mL   Comment: Performed at [facility]  URINALYSIS, ROUTINE W REFLEX MICROSCOPIC     Status: Abnormal   Collection Time    04/21/13  9:00 PM      Result Value Range   Color, Urine AMBER (*) YELLOW   Comment: BIOCHEMICALS MAY BE AFFECTED BY COLOR   APPearance CLEAR  CLEAR   Specific Gravity, Urine 1.024  1.005 - 1.030   pH 5.5  5.0 - 8.0   Glucose, UA NEGATIVE  NEGATIVE mg/dL   Hgb urine dipstick NEGATIVE  NEGATIVE   Bilirubin Urine SMALL (*) NEGATIVE   Ketones, ur 15 (*) NEGATIVE mg/dL   Protein, ur NEGATIVE  NEGATIVE mg/dL   Urobilinogen, UA 4.0 (*) 0.0 - 1.0 mg/dL   Nitrite NEGATIVE  NEGATIVE   Leukocytes, UA NEGATIVE  NEGATIVE   Comment: MICROSCOPIC NOT DONE ON URINES WITH NEGATIVE PROTEIN, BLOOD, LEUKOCYTES, NITRITE, OR GLUCOSE <1000 mg/dL.  GLUCOSE, CAPILLARY     Status: Abnormal   Collection Time    04/21/13  9:50 PM      Result Value Range   Glucose-Capillary 160 (*) 70 - 99 mg/dL  GLUCOSE, CAPILLARY     Status: Abnormal   Collection Time    04/22/13  8:09 AM      Result Value Range   Glucose-Capillary 161 (*) 70 - 99 mg/dL  CBC     Status: Abnormal   Collection Time    04/22/13  8:52 AM      Result Value Range   WBC 3.4 (*) 4.0 - 10.5 K/uL   RBC 2.68 (*) 4.22 - 5.81 MIL/uL   Hemoglobin 9.1 (*) 13.0 - 17.0 g/dL   HCT 16.1 (*) 09.6 - 04.5 %   MCV 103.0 (*) 78.0 - 100.0 fL   MCH 34.0  26.0 - 34.0 pg   MCHC 33.0  30.0 - 36.0 g/dL   RDW 40.9  81.1 - 91.4 %   Platelets 168  150 - 400 K/uL  TECHNOLOGIST SMEAR REVIEW     Status: None   Collection Time    04/22/13  8:52 AM      Result Value Range   Tech Review POLYCHROMASIA PRESENT     Comment: TEARDROP CELLS  GLUCOSE, CAPILLARY     Status: Abnormal   Collection Time    04/22/13 12:04 PM      Result Value Range   Glucose-Capillary 180  (*) 70 - 99 mg/dL   Comment 1 Notify RN     Comment 2 Documented in Chart     No results found for this basename: sdes,  specrequest,  cult,  reptstatus   Dg Chest 2 View  04/21/2013   CLINICAL DATA:  Fever of unknown origin for 2 weeks, cough, shortness of breath, question pneumonia, history diabetes, HIV, hypertension, asthma  EXAM: CHEST  2 VIEW  COMPARISON:  09/03/2012  FINDINGS: Upper normal heart size.  Mediastinal contours and pulmonary vascularity normal.  Minimal atelectasis left base.  Lungs otherwise clear.  No pleural effusion or pneumothorax.  No acute osseous findings.  IMPRESSION: Minimal atelectasis left base.   Electronically Signed   By: Ulyses Southward M.D.   On: 04/21/2013 19:47   Recent Results (from the past 240 hour(s))  INFLUENZA A & B PCR     Status: None   Collection Time    04/21/13  2:30 PM      Result Value Range  Status   Source Not Provided   Final   Influenza A NOT DETECTED   Final   Influenza B NOT DETECTED   Final   Comment:           *Normal Reference Range for each Analyte: Not Detected*           Testing performed using the [employer] xTAG Respiratory Viral Panel test     kit.           The performance characteristics of this assay were determined by     [facility]. This test has not been cleared or approved by     the U.S. Food and Drug Administration (FDA) for this specimen type.     The FDA has determined that such clearance or approval is not     necessary. This test is used for clinical purposes. It should be     regarded as investigational or for research. This laboratory is     certified under the Clinical Laboratory Improvement Amendments of 1988     (CLIA-88) as qualified to perform high complexity testing.  MRSA PCR SCREENING     Status: None   Collection Time    04/21/13  7:51 PM      Result Value Range Status   MRSA by PCR NEGATIVE  NEGATIVE Final   Comment:            The GeneXpert MRSA Assay (FDA     approved for NASAL  specimens     only), is one component of a     comprehensive MRSA colonization     surveillance program. It is not     intended to diagnose MRSA     infection nor to guide or     monitor treatment for     MRSA infections.      04/22/2013, 3:01 PM     LOS: 1 day

## 2013-04-22 NOTE — H&P (Signed)
  Date: 04/22/2013  Patient name: Nathan [name]  Medical record number: [id number]  Date of birth: 1950-11-14   I have seen and evaluated Nathan [name] and discussed their care with the Residency Team.   Nathan [name] is a 62yo man with h/o DM2, PVD, HIV who presents for fevers X 2 weeks.  Associated symptoms include malaise, chills, headaches with the fever and joint stiffness.  Home temps have been up to 103F.  He has taken tylenol with some relief.  No other symptoms are noted except decreased appetite, he has no neck stiffness or confusion or rash.  He does have some decreased appetite.  His HIV is well controlled. His last new medication was MTX, which was stopped in October.  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   Assessment and Plan: I have seen and evaluated the patient as outlined above. I agree with the formulated Assessment and Plan as detailed in the residents' admission note, with the following changes:   1. Fever/malaise/headaceh X 2 weeks:  DDx includes meningitis, however, at the time of admission there was no documented fever, no meningismus; he is not disoriented or confused making encephalitis less likely.  We will evaluate for CAP (CXR relatively normal), UTI (UA and UC sent), will check blood cultures, follow up flu PCR, check a TSH.  Also something to consider would be endocarditis vs. Lymphoma vs. An atypical infection vs. An abscess.  Opportunistic infections would be less likely given good control of HIV, but should be evaluated if no other source is noted.   Other issues per resident note.   Nathan Catalina, MD 12/16/20141:19 PM

## 2013-04-22 NOTE — Progress Notes (Signed)
Echocardiogram 2D Echocardiogram has been performed.  04/22/2013 4:11 PM Gertie Fey, RVT, RDCS, RDMS

## 2013-04-23 ENCOUNTER — Inpatient Hospital Stay (HOSPITAL_COMMUNITY): Payer: Medicare Other

## 2013-04-23 DIAGNOSIS — I10 Essential (primary) hypertension: Secondary | ICD-10-CM

## 2013-04-23 DIAGNOSIS — IMO0001 Reserved for inherently not codable concepts without codable children: Secondary | ICD-10-CM

## 2013-04-23 DIAGNOSIS — E785 Hyperlipidemia, unspecified: Secondary | ICD-10-CM

## 2013-04-23 DIAGNOSIS — I959 Hypotension, unspecified: Secondary | ICD-10-CM

## 2013-04-23 DIAGNOSIS — K219 Gastro-esophageal reflux disease without esophagitis: Secondary | ICD-10-CM

## 2013-04-23 LAB — CBC
Hemoglobin: 8.9 g/dL — ABNORMAL LOW (ref 13.0–17.0)
MCH: 34.1 pg — ABNORMAL HIGH (ref 26.0–34.0)
MCV: 104.2 fL — ABNORMAL HIGH (ref 78.0–100.0)
Platelets: 182 10*3/uL (ref 150–400)
RBC: 2.61 MIL/uL — ABNORMAL LOW (ref 4.22–5.81)
RDW: 14.1 % (ref 11.5–15.5)
WBC: 3.8 10*3/uL — ABNORMAL LOW (ref 4.0–10.5)

## 2013-04-23 LAB — GLUCOSE, CAPILLARY
Glucose-Capillary: 139 mg/dL — ABNORMAL HIGH (ref 70–99)
Glucose-Capillary: 193 mg/dL — ABNORMAL HIGH (ref 70–99)
Glucose-Capillary: 219 mg/dL — ABNORMAL HIGH (ref 70–99)

## 2013-04-23 LAB — BASIC METABOLIC PANEL
CO2: 24 mEq/L (ref 19–32)
Calcium: 8.1 mg/dL — ABNORMAL LOW (ref 8.4–10.5)
Creatinine, Ser: 0.96 mg/dL (ref 0.50–1.35)
GFR calc Af Amer: >90 mL/min (ref >90)
Sodium: 134 mEq/L — ABNORMAL LOW (ref 135–145)

## 2013-04-23 MED ORDER — IOHEXOL 300 MG/ML  SOLN
25.0000 mL | INTRAMUSCULAR | Status: AC
  Administered 2013-04-23 (×2): 25 mL via ORAL

## 2013-04-23 MED ORDER — ACETAMINOPHEN 325 MG PO TABS
650.0000 mg | ORAL_TABLET | Freq: Once | ORAL | Status: AC
  Administered 2013-04-23: 650 mg via ORAL
  Filled 2013-04-23: qty 2

## 2013-04-23 MED ORDER — IOHEXOL 300 MG/ML  SOLN
80.0000 mL | Freq: Once | INTRAMUSCULAR | Status: AC | PRN
  Administered 2013-04-23: 100 mL via INTRAVENOUS

## 2013-04-23 NOTE — Progress Notes (Signed)
INFECTIOUS DISEASE PROGRESS NOTE  ID: Nathan [name] is a 62 y.o. male with  Active Problems:   Fever of unknown origin  Subjective: Without complaints.   Abtx:  Anti-infectives   Start     Dose/Rate Route Frequency Ordered Stop   04/22/13 1800  vancomycin (VANCOCIN) IVPB 1000 mg/200 mL premix  Status:  Discontinued     1,000 mg 200 mL/hr over 60 Minutes Intravenous Every 12 hours 04/22/13 0618 04/23/13 1029   04/22/13 1000  tenofovir (VIREAD) tablet 300 mg     300 mg Oral Daily 04/21/13 1745     04/22/13 0645  vancomycin (VANCOCIN) 1,500 mg in sodium chloride 0.9 % 500 mL IVPB     1,500 mg 250 mL/hr over 120 Minutes Intravenous  Once 04/22/13 0618 04/22/13 1044   04/22/13 0645  piperacillin-tazobactam (ZOSYN) IVPB 3.375 g  Status:  Discontinued     3.375 g 12.5 mL/hr over 240 Minutes Intravenous 3 times per day 04/22/13 0618 04/23/13 1029   04/21/13 2200  lamiVUDine-zidovudine (COMBIVIR) 150-300 MG per tablet 1 tablet     1 tablet Oral 2 times daily 04/21/13 1745     04/21/13 2200  raltegravir (ISENTRESS) tablet 400 mg     400 mg Oral 2 times daily 04/21/13 1745        Medications:  Scheduled: . clopidogrel  75 mg Oral Daily  . enoxaparin (LOVENOX) injection  40 mg Subcutaneous Q24H  . insulin aspart  0-9 Units Subcutaneous TID WC  . insulin aspart  5 Units Subcutaneous TID WC  . insulin glargine  15 Units Subcutaneous QHS  . lamiVUDine-zidovudine  1 tablet Oral BID  . pantoprazole  40 mg Oral Daily  . raltegravir  400 mg Oral BID  . sodium chloride  3 mL Intravenous Q12H  . tenofovir  300 mg Oral Daily    Objective: Vital signs in last 24 hours: Temp:  [97.4 F (36.3 C)-101 F (38.3 C)] 98.4 F (36.9 C) (12/17 0855) Pulse Rate:  [77-98] 77 (12/17 0803) Resp:  [18] 18 (12/17 0803) BP: (84-112)/(38-83) 103/54 mmHg (12/17 0803) SpO2:  [90 %-98 %] 94 % (12/17 0803)   General appearance: alert, cooperative and no distress Resp: clear to auscultation  bilaterally Cardio: regular rate and rhythm GI: normal findings: bowel sounds normal and soft, non-tender  Lab Results  Recent Labs  04/21/13 1957 04/22/13 0852 04/23/13 0550  WBC 4.3 3.4* 3.8*  HGB 10.2* 9.1* 8.9*  HCT 30.8* 27.6* 27.2*  NA 130*  --  134*  K 3.9  --  3.8  CL 95*  --  100  CO2 20  --  24  BUN 12  --  9  CREATININE 1.00  --  0.96   Liver Panel  Recent Labs  04/21/13 1957  PROT 7.2  ALBUMIN 3.4*  AST 56*  ALT 33  ALKPHOS 119*  BILITOT 0.5   Sedimentation Rate No results found for this basename: ESRSEDRATE,  in the last 72 hours C-Reactive Protein No results found for this basename: CRP,  in the last 72 hours  Microbiology: Recent Results (from the past 240 hour(s))  INFLUENZA A & B PCR     Status: None   Collection Time    04/21/13  2:30 PM      Result Value Range Status   Source Not Provided   Final   Influenza A NOT DETECTED   Final   Influenza B NOT DETECTED   Final   Comment:           *  Normal Reference Range for each Analyte: Not Detected*           Testing performed using the Luminex xTAG Respiratory Viral Panel test     kit.           The performance characteristics of this assay were determined by     [facility]. This test has not been cleared or approved by     the U.S. Food and Drug Administration (FDA) for this specimen type.     The FDA has determined that such clearance or approval is not     necessary. This test is used for clinical purposes. It should be     regarded as investigational or for research. This laboratory is     certified under the Clinical Laboratory Improvement Amendments of 1988     (CLIA-88) as qualified to perform high complexity testing.  MRSA PCR SCREENING     Status: None   Collection Time    04/21/13  7:51 PM      Result Value Range Status   MRSA by PCR NEGATIVE  NEGATIVE Final   Comment:            The GeneXpert MRSA Assay (FDA     approved for NASAL specimens     only), is one  component of a     comprehensive MRSA colonization     surveillance program. It is not     intended to diagnose MRSA     infection nor to guide or     monitor treatment for     MRSA infections.  CULTURE, BLOOD (ROUTINE X 2)     Status: None   Collection Time    04/21/13  7:57 PM      Result Value Range Status   Specimen Description BLOOD RIGHT ARM   Final   Special Requests BOTTLES DRAWN AEROBIC ONLY 5 CC   Final   Culture  Setup Time     Final   Value: 04/22/2013 01:46     Performed at [facility]   Culture     Final   Value:        BLOOD CULTURE RECEIVED NO GROWTH TO DATE CULTURE WILL BE HELD FOR 5 DAYS BEFORE ISSUING A FINAL NEGATIVE REPORT     Performed at [facility]   Report Status PENDING   Incomplete  CULTURE, BLOOD (ROUTINE X 2)     Status: None   Collection Time    04/21/13  8:00 PM      Result Value Range Status   Specimen Description BLOOD LEFT ARM   Final   Special Requests BOTTLES DRAWN AEROBIC ONLY 10 CC   Final   Culture  Setup Time     Final   Value: 04/22/2013 01:46     Performed at [facility]   Culture     Final   Value:        BLOOD CULTURE RECEIVED NO GROWTH TO DATE CULTURE WILL BE HELD FOR 5 DAYS BEFORE ISSUING A FINAL NEGATIVE REPORT     Performed at [facility]   Report Status PENDING   Incomplete  CULTURE, BLOOD (ROUTINE X 2)     Status: None   Collection Time    04/22/13  6:40 AM      Result Value Range Status   Specimen Description BLOOD LEFT FOREARM   Final   Special Requests BOTTLES DRAWN AEROBIC AND ANAEROBIC 10CC   Final  Culture  Setup Time     Final   Value: 04/22/2013 14:41     Performed at [facility]   Culture     Final   Value:        BLOOD CULTURE RECEIVED NO GROWTH TO DATE CULTURE WILL BE HELD FOR 5 DAYS BEFORE ISSUING A FINAL NEGATIVE REPORT     Performed at [facility]   Report Status PENDING   Incomplete    Studies/Results: Dg Chest 2 View  04/21/2013   CLINICAL DATA:   Fever of unknown origin for 2 weeks, cough, shortness of breath, question pneumonia, history diabetes, HIV, hypertension, asthma  EXAM: CHEST  2 VIEW  COMPARISON:  09/03/2012  FINDINGS: Upper normal heart size.  Mediastinal contours and pulmonary vascularity normal.  Minimal atelectasis left base.  Lungs otherwise clear.  No pleural effusion or pneumothorax.  No acute osseous findings.  IMPRESSION: Minimal atelectasis left base.   Electronically Signed   By: Ulyses Southward M.D.   On: 04/21/2013 19:47     Assessment/Plan: Fever  HIV+  DM2  PVD  anbx stopped.  CK is quite elevated.  ? Statin reaction CT scan of chest/abd/pelvis is pending.   Total days of antibiotics: stopped          Johny Sax Infectious Diseases (pager) [phone] [email or web address] 04/23/2013, 3:49 PM  LOS: 2 days

## 2013-04-23 NOTE — Progress Notes (Addendum)
Subjective: Overnight he had a Tmax of 101. His BP was noted to be low (84/38) @2135  so he was given a 1L NS bolus and continuous IVF NS @150cc /hr after that. Additional blood cultures were drawn.  Patient seen and examined at the bedside this morning. He feels the same, no better but no worse. Still with headache, weakness. Denies SOB, chest pain, abdominal pain. Still in good spirits.  Objective: Vital signs in last 24 hours: Filed Vitals:   04/22/13 2221 04/23/13 0024 04/23/13 0504 04/23/13 0803  BP:  92/55 91/83 103/54  Pulse:  82 83 77  Temp:  100.3 F (37.9 C) 100.6 F (38.1 C) 98.6 F (37 C)  TempSrc:  Oral Oral Oral  Resp:    18  Height:      Weight:      SpO2: 94%   94%   Weight change:   Intake/Output Summary (Last 24 hours) at 04/23/13 0825 Last data filed at 04/23/13 0804  Gross per 24 hour  Intake 1942.5 ml  Output   3375 ml  Net -1432.5 ml   Physical Exam  Constitutional: He is oriented to person, place, and time and well-developed, well-nourished, and in no distress.  Very pleasant and conversant.  HENT:  Head: Normocephalic and atraumatic.  Eyes: Conjunctivae and EOM are normal. Pupils are equal, round, and reactive to light.  Neck: Normal range of motion. Neck supple.  No meningismus  Cardiovascular: Normal rate, regular rhythm and normal heart sounds. Exam reveals no gallop and no friction rub.  No murmur heard.  Pulmonary/Chest: Effort normal and breath sounds normal. No respiratory distress. He has no wheezes. He has no rales. He exhibits no tenderness.  Abdominal: Soft. He exhibits no distension. There is no tenderness.  Musculoskeletal: Normal range of motion. He exhibits no edema and no tenderness.  Status post right AKA. Stump is clean, dry, intact, nonerythematous.  Lymphadenopathy:  He has no cervical adenopathy.  Neurological: He is alert and oriented to person, place, and time. No cranial nerve deficit. GCS score is 15.  Skin: Skin is  warm and dry. No rash noted. No Janeway lesions or splinter hemorrhages noted on hands or feet. Psychiatric: Affect normal.   Lab Results: Basic Metabolic Panel:  Recent Labs Lab 04/21/13 1957 04/23/13 0550  NA 130* 134*  K 3.9 3.8  CL 95* 100  CO2 20 24  GLUCOSE 109* 121*  BUN 12 9  CREATININE 1.00 0.96  CALCIUM 8.9 8.1*   Liver Function Tests:  Recent Labs Lab 04/21/13 1957  AST 56*  ALT 33  ALKPHOS 119*  BILITOT 0.5  PROT 7.2  ALBUMIN 3.4*   No results found for this basename: LIPASE, AMYLASE,  in the last 168 hours No results found for this basename: AMMONIA,  in the last 168 hours CBC:  Recent Labs Lab 04/21/13 1957 04/22/13 0852 04/23/13 0550  WBC 4.3 3.4* 3.8*  NEUTROABS 1.8  --   --   HGB 10.2* 9.1* 8.9*  HCT 30.8* 27.6* 27.2*  MCV 102.3* 103.0* 104.2*  PLT 181 168 182   Cardiac Enzymes:  Recent Labs Lab 04/22/13 1700  CKTOTAL 434*   BNP: No results found for this basename: PROBNP,  in the last 168 hours D-Dimer: No results found for this basename: DDIMER,  in the last 168 hours CBG:  Recent Labs Lab 04/21/13 1652 04/21/13 2150 04/22/13 0809 04/22/13 1204 04/22/13 1643 04/22/13 2126  GLUCAP 95 160* 161* 180* 196* 174*   Hemoglobin  A1C:  Recent Labs Lab 04/21/13 1957  HGBA1C 7.7*   Fasting Lipid Panel: No results found for this basename: CHOL, HDL, LDLCALC, TRIG, CHOLHDL, LDLDIRECT,  in the last 168 hours Thyroid Function Tests:  Recent Labs Lab 04/21/13 1957  TSH 1.420   Coagulation: No results found for this basename: LABPROT, INR,  in the last 168 hours Anemia Panel: No results found for this basename: VITAMINB12, FOLATE, FERRITIN, TIBC, IRON, RETICCTPCT,  in the last 168 hours Urine Drug Screen: Drugs of Abuse  No results found for this basename: labopia,  cocainscrnur,  labbenz,  amphetmu,  thcu,  labbarb    Alcohol Level: No results found for this basename: ETH,  in the last 168 hours Urinalysis:  Recent  Labs Lab 04/21/13 2100  COLORURINE AMBER*  LABSPEC 1.024  PHURINE 5.5  GLUCOSEU NEGATIVE  HGBUR NEGATIVE  BILIRUBINUR SMALL*  KETONESUR 15*  PROTEINUR NEGATIVE  UROBILINOGEN 4.0*  NITRITE NEGATIVE  LEUKOCYTESUR NEGATIVE    Micro Results: Recent Results (from the past 240 hour(s))  INFLUENZA A & B PCR     Status: None   Collection Time    04/21/13  2:30 PM      Result Value Range Status   Source Not Provided   Final   Influenza A NOT DETECTED   Final   Influenza B NOT DETECTED   Final   Comment:           **Normal Reference Range for each Analyte: Not Detected**           Testing performed using the Luminex xTAG Respiratory Viral Panel test     kit.           The performance characteristics of this assay were determined by     [facility]. This test has not been cleared or approved by     the U.S. Food and Drug Administration (FDA) for this specimen type.     The FDA has determined that such clearance or approval is not     necessary. This test is used for clinical purposes. It should be     regarded as investigational or for research. This laboratory is     certified under the Clinical Laboratory Improvement Amendments of 1988     (CLIA-88) as qualified to perform high complexity testing.  MRSA PCR SCREENING     Status: None   Collection Time    04/21/13  7:51 PM      Result Value Range Status   MRSA by PCR NEGATIVE  NEGATIVE Final   Comment:            The GeneXpert MRSA Assay (FDA     approved for NASAL specimens     only), is one component of a     comprehensive MRSA colonization     surveillance program. It is not     intended to diagnose MRSA     infection nor to guide or     monitor treatment for     MRSA infections.   Studies/Results: Dg Chest 2 View  04/21/2013   CLINICAL DATA:  Fever of unknown origin for 2 weeks, cough, shortness of breath, question pneumonia, history diabetes, HIV, hypertension, asthma  EXAM: CHEST  2 VIEW  COMPARISON:   09/03/2012  FINDINGS: Upper normal heart size.  Mediastinal contours and pulmonary vascularity normal.  Minimal atelectasis left base.  Lungs otherwise clear.  No pleural effusion or pneumothorax.  No acute osseous findings.  IMPRESSION: Minimal  atelectasis left base.   Electronically Signed   By: Ulyses Southward M.D.   On: 04/21/2013 19:47   Medications: I have reviewed the patient's current medications. Scheduled Meds: . clopidogrel  75 mg Oral Daily  . enoxaparin (LOVENOX) injection  40 mg Subcutaneous Q24H  . insulin aspart  0-9 Units Subcutaneous TID WC  . insulin aspart  5 Units Subcutaneous TID WC  . insulin glargine  15 Units Subcutaneous QHS  . lamiVUDine-zidovudine  1 tablet Oral BID  . pantoprazole  40 mg Oral Daily  . piperacillin-tazobactam (ZOSYN)  IV  3.375 g Intravenous Q8H  . raltegravir  400 mg Oral BID  . sodium chloride  3 mL Intravenous Q12H  . tenofovir  300 mg Oral Daily  . vancomycin  1,000 mg Intravenous Q12H   Continuous Infusions: . sodium chloride 150 mL/hr at 04/23/13 0653   PRN Meds:. Assessment/Plan: Nathan [name] is a 62 y.o. male PMH DM2, PVD s/p right AKA, well controlled HIV (CD4 655, viral load <40), dyslipidemia, HTN, GERD, who presents as a direct admission from Infectious Disease clinic with a chief complaint of fever, malaise, and headache x2 weeks.   #Fever, malaise, myalgias, headache x2 weeks - Unknown origin. He did spike fevers again overnight, Tmax 101.7. Exam is nonfocal. Work up so far has been negative, as below. Does not appear infectious. Blood smear is remarkable for polychromasia with teardrop cells, so there is some concern for myeloproliferative disorder. He has had recent weight loss: weight was 172 on 10/26, it is 161 here. Some suggestion of pancytopenia with WBC of 3.4 (baseline 6) and hemoglobin of ~9 (baseline 12-13), platelets 168 (baseline 230). We will pan-scan him today for malignancy and lymphadenopathy, and based on the results  consider heme/onc consult and a bone marrow biopsy.  - Appreciate ID recs - CK > 434 (elevated) - Discontinue statin - Agree with discontinuing antibiotics - 2D echo > EF 60-65%, no vegetation mentioned, no significant valve regurgitation - Chest x-ray > no acute abnormalities except Minimal atelectasis left base - Followup influenza PCR > A and B negative - Tech smear review > Polychromasia with teardrop cells, pathologist review in process - CT chest, abdomen, and pelvis with contrast - ID pharmacist consult for medication review for drug fever, he has no eosinophilia > Per pharmacy, only potential culprit would be statin which we discontinued - Urinalysis > Clean - Blood cultures > In progress - TSH > 1.42 (wnl) - Regular diet  - Daily BMP, CBC  #Hypotension - Nadir of 84/38 overnight. Improved to 103/54 s/p 1L NS. - IVF NS @150cc /hr - Holding lisinopril 40 mg daily  #DM2 - CBG 95 on admission. Last A1c 7.5 on 12/05/2012.  - Lantus 15 units daily at bedtime  - Sensitive sliding scale insulin  - Checking hemoglobin A1c since he is due > 7.7  #HIV - Well controlled on Combivir 150-300 mg twice a day, raltegravir 400 mg twice a day, tenofovir 300 mg daily. CD4 655, viral load <40 on 03/04/13.  - Continue these medications   #HTN - Holding lisinopril 40 mg daily while hypotensive.  #HLD - Discontinued statin as above over concern for myopathy.  #GERD - Continue PPI.   #PVD - Continue Plavix 75 mg daily.   #DVT PPX - Subcutaneous Lovenox   Dispo: Disposition is deferred at this time, awaiting improvement of current medical problems.  Anticipated discharge in approximately 1-3 day(s).   The patient does have a current PCP (  Annett Gula, MD) and does need an [facility] hospital follow-up appointment after discharge.  The patient does not have transportation limitations that hinder transportation to clinic appointments.  .Services Needed at time of discharge: Y = Yes, Blank =  No PT:   OT:   RN:   Equipment:   Other:     LOS: 2 days   Vivi Barrack, MD 04/23/2013, 8:25 AM

## 2013-04-23 NOTE — Progress Notes (Signed)
  Date: 04/23/2013  Patient name: Nathan [name]  Medical record number: [id number]  Date of birth: May 08, 1951   This patient has been seen and the plan of care was discussed with the house staff. Please see their note for complete details. I concur with their findings with the following additions/corrections:  Plan for CT chest/abd/pelvis today to evaluate for LAD.  Reviewed his medications, Tenofovir has been shown to cause fever in 4-11%, however, he has been on this medication for > 2 years.  He also is on raltegravir, I was able to find a case report of DRESS with this medication, but he does not fit the classic symptoms of this diagnosis.  He has also been on this medication for > 2 years.  Further work up is pending for his FUO.  Abx will be discontinued.     Inez Catalina, MD 04/23/2013, 11:36 AM

## 2013-04-24 DIAGNOSIS — D539 Nutritional anemia, unspecified: Secondary | ICD-10-CM

## 2013-04-24 LAB — CBC
MCH: 33.7 pg (ref 26.0–34.0)
MCHC: 32.8 g/dL (ref 30.0–36.0)
MCV: 102.9 fL — ABNORMAL HIGH (ref 78.0–100.0)
Platelets: 204 10*3/uL (ref 150–400)
RBC: 2.79 MIL/uL — ABNORMAL LOW (ref 4.22–5.81)
RDW: 14.1 % (ref 11.5–15.5)

## 2013-04-24 LAB — GLUCOSE, CAPILLARY
Glucose-Capillary: 190 mg/dL — ABNORMAL HIGH (ref 70–99)
Glucose-Capillary: 199 mg/dL — ABNORMAL HIGH (ref 70–99)
Glucose-Capillary: 222 mg/dL — ABNORMAL HIGH (ref 70–99)

## 2013-04-24 LAB — BASIC METABOLIC PANEL
BUN: 8 mg/dL (ref 6–23)
CO2: 24 mEq/L (ref 19–32)
Calcium: 8.5 mg/dL (ref 8.4–10.5)
Creatinine, Ser: 0.71 mg/dL (ref 0.50–1.35)
GFR calc Af Amer: >90 mL/min (ref >90)
GFR calc non Af Amer: >90 mL/min (ref >90)
Glucose, Bld: 164 mg/dL — ABNORMAL HIGH (ref 70–99)
Potassium: 4.1 mEq/L (ref 3.5–5.1)
Sodium: 135 mEq/L (ref 135–145)

## 2013-04-24 LAB — VITAMIN B12: Vitamin B-12: 347 pg/mL (ref 211–911)

## 2013-04-24 LAB — URIC ACID: Uric Acid, Serum: 2.8 mg/dL — ABNORMAL LOW (ref 4.0–7.8)

## 2013-04-24 LAB — LACTATE DEHYDROGENASE: LDH: 289 U/L — ABNORMAL HIGH (ref 94–250)

## 2013-04-24 NOTE — Progress Notes (Signed)
Subjective: No fevers overnight. Patient seen and examined at the bedside this morning. He feels a little bit better. Still with weakness. Denies SOB, chest pain, abdominal pain. Still in good spirits. I explained the results of his CT scan.  Objective: Vital signs in last 24 hours: Filed Vitals:   04/23/13 2010 04/23/13 2136 04/24/13 0550 04/24/13 0906  BP:  110/59 117/63 112/53  Pulse:  75 75 74  Temp: 98.1 F (36.7 C) 98.7 F (37.1 C) 98.6 F (37 C)   TempSrc:  Oral Oral   Resp:  18 18 20   Height:      Weight:  160 lb 8 oz (72.802 kg)    SpO2:  97% 97% 97%   Weight change:   Intake/Output Summary (Last 24 hours) at 04/24/13 0908 Last data filed at 04/24/13 1478  Gross per 24 hour  Intake    720 ml  Output   2525 ml  Net  -1805 ml   Physical Exam  Constitutional: He is oriented to person, place, and time and well-developed, well-nourished, and in no distress.  Very pleasant and conversant.  HENT:  Head: Normocephalic and atraumatic.  Eyes: Conjunctivae and EOM are normal. Pupils are equal, round, and reactive to light.  Neck: Normal range of motion. Neck supple.  No meningismus  Cardiovascular: Normal rate, regular rhythm and normal heart sounds. Exam reveals no gallop and no friction rub.  No murmur heard.  Pulmonary/Chest: Effort normal and breath sounds normal. No respiratory distress. He has no wheezes. He has no rales. He exhibits no tenderness.  Abdominal: Soft. He exhibits no distension. There is no tenderness.  Musculoskeletal: Normal range of motion. He exhibits no edema and no tenderness.  Status post right AKA. Stump is clean, dry, intact, nonerythematous.  Lymphadenopathy:  He has no cervical adenopathy.  Neurological: He is alert and oriented to person, place, and time. No cranial nerve deficit. GCS score is 15.  Skin: Skin is warm and dry. No rash noted. No Janeway lesions or splinter hemorrhages noted on hands or feet. Psychiatric: Affect normal.    Lab Results: Basic Metabolic Panel:  Recent Labs Lab 04/23/13 0550 04/24/13 0547  NA 134* 135  K 3.8 4.1  CL 100 102  CO2 24 24  GLUCOSE 121* 164*  BUN 9 8  CREATININE 0.96 0.71  CALCIUM 8.1* 8.5   Liver Function Tests:  Recent Labs Lab 04/21/13 1957  AST 56*  ALT 33  ALKPHOS 119*  BILITOT 0.5  PROT 7.2  ALBUMIN 3.4*   No results found for this basename: LIPASE, AMYLASE,  in the last 168 hours No results found for this basename: AMMONIA,  in the last 168 hours CBC:  Recent Labs Lab 04/21/13 1957  04/23/13 0550 04/24/13 0547  WBC 4.3  < > 3.8* 4.0  NEUTROABS 1.8  --   --   --   HGB 10.2*  < > 8.9* 9.4*  HCT 30.8*  < > 27.2* 28.7*  MCV 102.3*  < > 104.2* 102.9*  PLT 181  < > 182 204  < > = values in this interval not displayed. Cardiac Enzymes:  Recent Labs Lab 04/22/13 1700  CKTOTAL 434*   BNP: No results found for this basename: PROBNP,  in the last 168 hours D-Dimer: No results found for this basename: DDIMER,  in the last 168 hours CBG:  Recent Labs Lab 04/22/13 2126 04/23/13 0802 04/23/13 1112 04/23/13 1623 04/23/13 2133 04/24/13 0730  GLUCAP 174* 139* 193*  142* 219* 141*   Hemoglobin A1C:  Recent Labs Lab 04/21/13 1957  HGBA1C 7.7*   Fasting Lipid Panel: No results found for this basename: CHOL, HDL, LDLCALC, TRIG, CHOLHDL, LDLDIRECT,  in the last 168 hours Thyroid Function Tests:  Recent Labs Lab 04/21/13 1957  TSH 1.420   Coagulation: No results found for this basename: LABPROT, INR,  in the last 168 hours Anemia Panel: No results found for this basename: VITAMINB12, FOLATE, FERRITIN, TIBC, IRON, RETICCTPCT,  in the last 168 hours Urine Drug Screen: Drugs of Abuse  No results found for this basename: labopia,  cocainscrnur,  labbenz,  amphetmu,  thcu,  labbarb    Alcohol Level: No results found for this basename: ETH,  in the last 168 hours Urinalysis:  Recent Labs Lab 04/21/13 2100  COLORURINE AMBER*   LABSPEC 1.024  PHURINE 5.5  GLUCOSEU NEGATIVE  HGBUR NEGATIVE  BILIRUBINUR SMALL*  KETONESUR 15*  PROTEINUR NEGATIVE  UROBILINOGEN 4.0*  NITRITE NEGATIVE  LEUKOCYTESUR NEGATIVE    Micro Results: Results for orders placed during the hospital encounter of 04/21/13  MRSA PCR SCREENING     Status: None   Collection Time    04/21/13  7:51 PM      Result Value Range Status   MRSA by PCR NEGATIVE  NEGATIVE Final   Comment:            The GeneXpert MRSA Assay (FDA     approved for NASAL specimens     only), is one component of a     comprehensive MRSA colonization     surveillance program. It is not     intended to diagnose MRSA     infection nor to guide or     monitor treatment for     MRSA infections.  CULTURE, BLOOD (ROUTINE X 2)     Status: None   Collection Time    04/21/13  7:57 PM      Result Value Range Status   Specimen Description BLOOD RIGHT ARM   Final   Special Requests BOTTLES DRAWN AEROBIC ONLY 5 CC   Final   Culture  Setup Time     Final   Value: 04/22/2013 01:46     Performed at [facility]   Culture     Final   Value:        BLOOD CULTURE RECEIVED NO GROWTH TO DATE CULTURE WILL BE HELD FOR 5 DAYS BEFORE ISSUING A FINAL NEGATIVE REPORT     Performed at [facility]   Report Status PENDING   Incomplete  CULTURE, BLOOD (ROUTINE X 2)     Status: None   Collection Time    04/21/13  8:00 PM      Result Value Range Status   Specimen Description BLOOD LEFT ARM   Final   Special Requests BOTTLES DRAWN AEROBIC ONLY 10 CC   Final   Culture  Setup Time     Final   Value: 04/22/2013 01:46     Performed at [facility]   Culture     Final   Value:        BLOOD CULTURE RECEIVED NO GROWTH TO DATE CULTURE WILL BE HELD FOR 5 DAYS BEFORE ISSUING A FINAL NEGATIVE REPORT     Performed at [facility]   Report Status PENDING   Incomplete  CULTURE, BLOOD (ROUTINE X 2)     Status: None   Collection Time    04/22/13  6:40  AM      Result  Value Range Status   Specimen Description BLOOD LEFT FOREARM   Final   Special Requests BOTTLES DRAWN AEROBIC AND ANAEROBIC 10CC   Final   Culture  Setup Time     Final   Value: 04/22/2013 14:41     Performed at [facility]   Culture     Final   Value:        BLOOD CULTURE RECEIVED NO GROWTH TO DATE CULTURE WILL BE HELD FOR 5 DAYS BEFORE ISSUING A FINAL NEGATIVE REPORT     Performed at [facility]   Report Status PENDING   Incomplete  CULTURE, BLOOD (SINGLE)     Status: None   Collection Time    04/22/13 11:05 PM      Result Value Range Status   Specimen Description BLOOD LEFT ARM   Final   Special Requests BOTTLES DRAWN AEROBIC AND ANAEROBIC 5CC   Final   Culture  Setup Time     Final   Value: 04/23/2013 04:22     Performed at [facility]   Culture     Final   Value:        BLOOD CULTURE RECEIVED NO GROWTH TO DATE CULTURE WILL BE HELD FOR 5 DAYS BEFORE ISSUING A FINAL NEGATIVE REPORT     Performed at [facility]   Report Status PENDING   Incomplete    Recent Results (from the past 240 hour(s))  INFLUENZA A & B PCR     Status: None   Collection Time    04/21/13  2:30 PM      Result Value Range Status   Source Not Provided   Final   Influenza A NOT DETECTED   Final   Influenza B NOT DETECTED   Final   Comment:           **Normal Reference Range for each Analyte: Not Detected**           Testing performed using the Lum[employer] xTAG Respiratory Viral Panel test     kit.           The performance characteristics of this assay were determined by     [facility]. This test has not been cleared or approved by     the U.S. Food and Drug Administration (FDA) for this specimen type.     The FDA has determined that such clearance or approval is not     necessary. This test is used for clinical purposes. It should be     regarded as investigational or for research. This laboratory is     certified under the Clinical Laboratory Improvement  Amendments of 1988     (CLIA-88) as qualified to perform high complexity testing.  MRSA PCR SCREENING     Status: None   Collection Time    04/21/13  7:51 PM      Result Value Range Status   MRSA by PCR NEGATIVE  NEGATIVE Final   Comment:            The GeneXpert MRSA Assay (FDA     approved for NASAL specimens     only), is one component of a     comprehensive MRSA colonization     surveillance program. It is not     intended to diagnose MRSA     infection nor to guide or     monitor treatment for     MRSA  infections.   Studies/Results: Ct Chest W Contrast  04/23/2013   CLINICAL DATA:  Fever of unknown origin, concern for malignancy.  EXAM: CT CHEST, ABDOMEN, AND PELVIS WITH CONTRAST  TECHNIQUE: Multidetector CT imaging of the chest, abdomen and pelvis was performed following the standard protocol during bolus administration of intravenous contrast.  CONTRAST:  OMNIPAQUE IOHEXOL 300 MG/ML  SOLN  COMPARISON:  CT 07/02/2008  FINDINGS: CT CHEST FINDINGS  No axillary or supraclavicular lymphadenopathy. No mediastinal hilar lymphadenopathy. No pericardial fluid. Coronary calcifications are present. Esophagus is normal.  Review of the lung windows demonstrates linear atelectasis in the left and right lower lobes. No evidence of consolidation or pneumonia. There is mild centrilobular emphysema in the upper lobes.  CT ABDOMEN AND PELVIS FINDINGS  No focal hepatic lesion. Gallbladder contains a tiny gallstone. No evidence of gallbladder inflammation. Pancreas, spleen, adrenal glands, kidneys are normal. The stomach, small bowel, and cecum are normal. Colon and rectosigmoid colon are normal. Abdominal or is normal caliber.  There is no periportal lymphadenopathy. There is enlarged lymph node at the left common iliac nodal station measuring 18 mm. There is haziness to the fat surrounding this lymph node. No evidence of more deep pelvic lymphadenopathy. Small high periaortic lymph nodes which are not  pathologic by size criteria but do have mild haziness surrounding. The largest measures 9 mm between the IVC and aorta (image 88).  Prostate gland and bladder normal. Vascular calcifications noted. There is a stent of some sort in the right groin. No aggressive osseous lesion.  IMPRESSION:  1. No evidence infection or malignancy in the thorax. Bibasilar atelectasis noted.  2. Enlarged and mildly inflamed lymph node in the left common iliac location. Legrand Rams this to represent reactive adenopathy over malignancy but cannot exclude malignant lymph node including lymphoma. An outpatient FDG PET scan may be beneficial.  3. No evidence of source of infection in the abdomen or pelvis. There are additional smaller periaortic lymph nodes which are mildly inflamed.   Electronically Signed   By: Genevive Bi M.D.   On: 04/23/2013 16:20   Ct Abdomen Pelvis W Contrast  04/23/2013   CLINICAL DATA:  Fever of unknown origin, concern for malignancy.  EXAM: CT CHEST, ABDOMEN, AND PELVIS WITH CONTRAST  TECHNIQUE: Multidetector CT imaging of the chest, abdomen and pelvis was performed following the standard protocol during bolus administration of intravenous contrast.  CONTRAST:  OMNIPAQUE IOHEXOL 300 MG/ML  SOLN  COMPARISON:  CT 07/02/2008  FINDINGS: CT CHEST FINDINGS  No axillary or supraclavicular lymphadenopathy. No mediastinal hilar lymphadenopathy. No pericardial fluid. Coronary calcifications are present. Esophagus is normal.  Review of the lung windows demonstrates linear atelectasis in the left and right lower lobes. No evidence of consolidation or pneumonia. There is mild centrilobular emphysema in the upper lobes.  CT ABDOMEN AND PELVIS FINDINGS  No focal hepatic lesion. Gallbladder contains a tiny gallstone. No evidence of gallbladder inflammation. Pancreas, spleen, adrenal glands, kidneys are normal. The stomach, small bowel, and cecum are normal. Colon and rectosigmoid colon are normal. Abdominal or is normal  caliber.  There is no periportal lymphadenopathy. There is enlarged lymph node at the left common iliac nodal station measuring 18 mm. There is haziness to the fat surrounding this lymph node. No evidence of more deep pelvic lymphadenopathy. Small high periaortic lymph nodes which are not pathologic by size criteria but do have mild haziness surrounding. The largest measures 9 mm between the IVC and aorta (image 88).  Prostate  gland and bladder normal. Vascular calcifications noted. There is a stent of some sort in the right groin. No aggressive osseous lesion.  IMPRESSION:  1. No evidence infection or malignancy in the thorax. Bibasilar atelectasis noted.  2. Enlarged and mildly inflamed lymph node in the left common iliac location. Legrand Rams this to represent reactive adenopathy over malignancy but cannot exclude malignant lymph node including lymphoma. An outpatient FDG PET scan may be beneficial.  3. No evidence of source of infection in the abdomen or pelvis. There are additional smaller periaortic lymph nodes which are mildly inflamed.   Electronically Signed   By: Genevive Bi M.D.   On: 04/23/2013 16:20   Medications: I have reviewed the patient's current medications. Scheduled Meds: . clopidogrel  75 mg Oral Daily  . enoxaparin (LOVENOX) injection  40 mg Subcutaneous Q24H  . insulin aspart  0-9 Units Subcutaneous TID WC  . insulin aspart  5 Units Subcutaneous TID WC  . insulin glargine  15 Units Subcutaneous QHS  . lamiVUDine-zidovudine  1 tablet Oral BID  . pantoprazole  40 mg Oral Daily  . raltegravir  400 mg Oral BID  . sodium chloride  3 mL Intravenous Q12H  . tenofovir  300 mg Oral Daily   Continuous Infusions:   PRN Meds:. Assessment/Plan: Nathan [name] is a 62 y.o. male PMH DM2, PVD s/p right AKA, well controlled HIV (CD4 655, viral load <40), dyslipidemia, HTN, GERD, who presents as a direct admission from Infectious Disease clinic with a chief complaint of fever, malaise, and  headache x2 weeks.   #Fever, malaise, myalgias, headache x2 weeks - Unknown origin. No fevers overnight. It's possible his statin was the culprit as this was discontinued about 2 days ago, but he has been it for years. Exam is nonfocal. He has no eosinophilia to suggest DRESS. Does not appear infectious (work up below). Blood smear is remarkable for polychromasia with teardrop cells per technician review, so there is some concern for myeloproliferative disorder. However the pathologist review of this specimen only noted neutropenia and macrocytic anemia. He has had recent weight loss: weight was 172lbs on 10/26, it is 160lbs here. Some suggestion of pancytopenia with WBC of 3.4 (baseline 6) and hemoglobin of ~9 (baseline 12-13). He will need outpatient FDG PET scan to work up inflamed lymph node seen on CT, which could possibly represent lymphoma. - Appreciate ID recs - CK > 434 (elevated) - Discontinue statin - Discontinue antibiotics - 2D echo > EF 60-65%, no vegetation mentioned, no significant valve regurgitation - Chest x-ray > no acute abnormalities except Minimal atelectasis left base - Followup influenza PCR > A and B negative - Tech smear review > Polychromasia with teardrop cells,  - Pathologist smear review > Neutropenia and macrocytic anemia - CT chest, abdomen, and pelvis with contrast > No evidence of frank infection or malignancy. Enlarged and mildly inflamed lymph node in the left common iliac location favored to represent reactive adenopathy over malignancy but cannot exclude malignant lymph node including lymphoma. An outpatient FDG PET scan may be beneficial. Additional smaller periaortic lymph nodes which are mildly inflamed. - Uric acid > 2.8 (low) - LDH > 289 (mildly elevated) - Serum protein electrophoresis > In process - ID pharmacist consult for medication review for drug fever > Per pharmacy, only potential culprit would be statin (which we discontinued 12/16) - Urinalysis  > Clean - Blood cultures > NGTD - TSH > 1.42 (wnl) - Regular diet  - Daily BMP,  CBC - PT since deconditioned and with AKA - If no fever for 24 more hours, can discharge home tomorrow with plan for outpatient FDG PET scan  #Chronic macrocytic anemia - MCV 102.9. This is chronic per chart review. He takes Combivir, which contains zidovidine, a known cause of macrocytic anemia. Will rule out B12 deficiency since no results in the system and easily correctable. - Vitamin B12  #Hypotension - Resolving with IVF resuscitation. BP 117/63 today. - IVF NS @150cc /hr - Holding lisinopril 40 mg daily  #DM2 - CBG 95 on admission. Last A1c 7.5 on 12/05/2012.  - Lantus 15 units daily at bedtime  - Sensitive sliding scale insulin  - Checking hemoglobin A1c since he is due > 7.7  #HIV - Well controlled on Combivir 150-300 mg twice a day, raltegravir 400 mg twice a day, tenofovir 300 mg daily. CD4 655, viral load <40 on 03/04/13.  - Continue these medications   #HTN - Holding lisinopril 40 mg daily while hypotensive.  #HLD - Discontinued statin as above over concern for myopathy.  #GERD - Continue PPI.   #PVD - Continue Plavix 75 mg daily.   #DVT PPX - Subcutaneous Lovenox   Dispo: Disposition is deferred at this time, awaiting improvement of current medical problems.  Anticipated discharge in approximately 1-3 day(s).   The patient does have a current PCP Annett Gula, MD) and does need an [facility] hospital follow-up appointment after discharge.  The patient does not have transportation limitations that hinder transportation to clinic appointments.  .Services Needed at time of discharge: Y = Yes, Blank = No PT:   OT:   RN:   Equipment:   Other:     LOS: 3 days   Vivi Barrack, MD 04/24/2013, 9:08 AM

## 2013-04-24 NOTE — Evaluation (Signed)
Physical Therapy Evaluation Patient Details Name: Nathan [name] MRN: [id number] DOB: [birth year] Today's Date: 04/24/2013 Time: 8295-6213 PT Time Calculation (min): 15 min  PT Assessment / Plan / Recommendation History of Present Illness  Pt is a 62 y.o. male adm from home due to fever. PMH includes  DM2; PVD, HIV.    Clinical Impression  Pt adm due to fever, malaise, chills. Pt demo decreased independence with functional mobility; pt is at supervision level and will need to be at mod I prior to D/C. Pt to benefit from skilled PT to address deficits listed below (see PT problem list below). Patient needs to practice stairs next session prior to D/C home.       PT Assessment  Patient needs continued PT services    Follow Up Recommendations  No PT follow up;Supervision - Intermittent    Does the patient have the potential to tolerate intense rehabilitation      Barriers to Discharge Decreased caregiver support lives alone; needs to be mod I for safe D/C     Equipment Recommendations  None recommended by PT    Recommendations for Other Services     Frequency Min 3X/week    Precautions / Restrictions Precautions Precautions: None Precaution Comments: denies any recent falls  Required Braces or Orthoses: Other Brace/Splint Other Brace/Splint: Rt prosthesis  Restrictions Weight Bearing Restrictions: No   Pertinent Vitals/Pain No c/o pain.       Mobility  Bed Mobility Bed Mobility: Supine to Sit;Sitting - Scoot to Edge of Bed;Sit to Supine Supine to Sit: 7: Independent;HOB flat Sitting - Scoot to Edge of Bed: 7: Independent Sit to Supine: 7: Independent;HOB flat Details for Bed Mobility Assistance: pt demo good technique  Transfers Transfers: Sit to Stand;Stand to Sit Sit to Stand: 5: Supervision;From bed Stand to Sit: 5: Supervision;To bed Details for Transfer Assistance: supervision for safety  Ambulation/Gait Ambulation/Gait Assistance: 5: Supervision Ambulation  Distance (Feet): 100 Feet Assistive device: None Ambulation/Gait Assistance Details: supervision for safety; pt with cirumducted gt on Rt LE; pt reports prosthetic limb is not charged therefore does not have knee flexion during swing phase; no LOB noted Gait Pattern: Decreased stride length;Right circumduction;Right hip hike;Wide base of support Gait velocity: WFL Stairs: No         PT Diagnosis: Abnormality of gait  PT Problem List: Decreased balance;Decreased mobility PT Treatment Interventions: DME instruction;Gait training;Stair training;Functional mobility training;Balance training;Neuromuscular re-education;Patient/family education     PT Goals(Current goals can be found in the care plan section) Acute Rehab PT Goals Patient Stated Goal: home tomorrow PT Goal Formulation: With patient Time For Goal Achievement: 05/01/13 Potential to Achieve Goals: Good  Visit Information  Last PT Received On: 04/24/13 Assistance Needed: +1 History of Present Illness: Pt is a 62 y.o. male adm from home due to fever. PMH includes        Prior Functioning  Home Living Family/patient expects to be discharged to:: Private residence Living Arrangements: Non-relatives/Friends Available Help at Discharge: Friend(s);Available PRN/intermittently Type of Home: House Home Access: Stairs to enter [employer] of Steps: 6 Entrance Stairs-Rails: Left;Right;Can reach both Home Layout: Two level;Bed/bath upstairs Alternate Level Stairs-Number of Steps: 14 Alternate Level Stairs-Rails: Right Home Equipment: Shower seat;Crutches Additional Comments: pt lives with a roommate  Prior Function Level of Independence: Independent Comments: pt has crutches and only uses them when his prosthetic leg off  and only has his leg off when in bed  Communication Communication: No difficulties Dominant Hand: Right  Cognition  Cognition Arousal/Alertness: Awake/alert Behavior During Therapy: WFL for  tasks assessed/performed Overall Cognitive Status: Within Functional Limits for tasks assessed    Extremity/Trunk Assessment Upper Extremity Assessment Upper Extremity Assessment: Overall WFL for tasks assessed Lower Extremity Assessment Lower Extremity Assessment: Overall WFL for tasks assessed (Rt AKA ) Cervical / Trunk Assessment Cervical / Trunk Assessment: Normal   Balance Balance Balance Assessed: Yes Static Sitting Balance Static Sitting - Balance Support: Feet unsupported;No upper extremity supported Static Sitting - Level of Assistance: 7: Independent Static Standing Balance Static Standing - Balance Support: No upper extremity supported;During functional activity Static Standing - Level of Assistance: 5: Stand by assistance High Level Balance High Level Balance Activites: Direction changes High Level Balance Comments: supervision and cues for safety with direction changes   End of Session PT - End of Session Equipment Utilized During Treatment: Gait belt;Other (comment) (Rt Prosthetic ) Activity Tolerance: Patient tolerated treatment well Patient left: in bed;with call bell/phone within reach;with family/visitor present Nurse Communication: Mobility status  GP     Donell Sievert, [place] [phone] 04/24/2013, 4:00 PM

## 2013-04-24 NOTE — Progress Notes (Signed)
INFECTIOUS DISEASE PROGRESS NOTE  ID: LINK BURGESON is a 62 y.o. male with  Active Problems:   Fever of unknown origin  Subjective: Without complaints.   Abtx:  Anti-infectives   Start     Dose/Rate Route Frequency Ordered Stop   04/22/13 1800  vancomycin (VANCOCIN) IVPB 1000 mg/200 mL premix  Status:  Discontinued     1,000 mg 200 mL/hr over 60 Minutes Intravenous Every 12 hours 04/22/13 0618 04/23/13 1029   04/22/13 1000  tenofovir (VIREAD) tablet 300 mg     300 mg Oral Daily 04/21/13 1745     04/22/13 0645  vancomycin (VANCOCIN) 1,500 mg in sodium chloride 0.9 % 500 mL IVPB     1,500 mg 250 mL/hr over 120 Minutes Intravenous  Once 04/22/13 0618 04/22/13 1044   04/22/13 0645  piperacillin-tazobactam (ZOSYN) IVPB 3.375 g  Status:  Discontinued     3.375 g 12.5 mL/hr over 240 Minutes Intravenous 3 times per day 04/22/13 0618 04/23/13 1029   04/21/13 2200  lamiVUDine-zidovudine (COMBIVIR) 150-300 MG per tablet 1 tablet     1 tablet Oral 2 times daily 04/21/13 1745     04/21/13 2200  raltegravir (ISENTRESS) tablet 400 mg     400 mg Oral 2 times daily 04/21/13 1745        Medications:  Scheduled: . clopidogrel  75 mg Oral Daily  . enoxaparin (LOVENOX) injection  40 mg Subcutaneous Q24H  . insulin aspart  0-9 Units Subcutaneous TID WC  . insulin aspart  5 Units Subcutaneous TID WC  . insulin glargine  15 Units Subcutaneous QHS  . lamiVUDine-zidovudine  1 tablet Oral BID  . pantoprazole  40 mg Oral Daily  . raltegravir  400 mg Oral BID  . sodium chloride  3 mL Intravenous Q12H  . tenofovir  300 mg Oral Daily    Objective: Vital signs in last 24 hours: Temp:  [97 F (36.1 C)-98.7 F (37.1 C)] 97 F (36.1 C) (12/18 1300) Pulse Rate:  [65-75] 65 (12/18 1300) Resp:  [18-20] 18 (12/18 1300) BP: (110-117)/(53-65) 114/65 mmHg (12/18 1300) SpO2:  [95 %-97 %] 95 % (12/18 1300) Weight:  [72.802 kg (160 lb 8 oz)] 72.802 kg (160 lb 8 oz) (12/17 2136)   General  appearance: alert, cooperative and no distress Resp: clear to auscultation bilaterally Cardio: regular rate and rhythm GI: normal findings: bowel sounds normal and soft, non-tender  Lab Results  Recent Labs  04/23/13 0550 04/24/13 0547  WBC 3.8* 4.0  HGB 8.9* 9.4*  HCT 27.2* 28.7*  NA 134* 135  K 3.8 4.1  CL 100 102  CO2 24 24  BUN 9 8  CREATININE 0.96 0.71   Liver Panel  Recent Labs  04/21/13 1957  PROT 7.2  ALBUMIN 3.4*  AST 56*  ALT 33  ALKPHOS 119*  BILITOT 0.5   Sedimentation Rate No results found for this basename: ESRSEDRATE,  in the last 72 hours C-Reactive Protein No results found for this basename: CRP,  in the last 72 hours  Microbiology: Recent Results (from the past 240 hour(s))  INFLUENZA A & B PCR     Status: None   Collection Time    04/21/13  2:30 PM      Result Value Range Status   Source Not Provided   Final   Influenza A NOT DETECTED   Final   Influenza B NOT DETECTED   Final   Comment:           **  Normal Reference Range for each Analyte: Not Detected**           Testing performed using the Lum[employer] xTAG Respiratory Viral Panel test     kit.           The performance characteristics of this assay were determined by     [facility]. This test has not been cleared or approved by     the U[place]. Food and Drug Administration (FDA) for this specimen type.     The FDA has determined that such clearance or approval is not     necessary. This test is used for clinical purposes. It should be     regarded as investigational or for research. This laboratory is     certified under the Clinical Laboratory Improvement Amendments of 1988     (CLIA-88) as qualified to perform high complexity testing.  MRSA PCR SCREENING     Status: None   Collection Time    04/21/13  7:51 PM      Result Value Range Status   MRSA by PCR NEGATIVE  NEGATIVE Final   Comment:            The GeneXpert MRSA Assay (FDA     approved for NASAL specimens     only),  is one component of a     comprehensive MRSA colonization     surveillance program. It is not     intended to diagnose MRSA     infection nor to guide or     monitor treatment for     MRSA infections.  CULTURE, BLOOD (ROUTINE X 2)     Status: None   Collection Time    04/21/13  7:57 PM      Result Value Range Status   Specimen Description BLOOD RIGHT ARM   Final   Special Requests BOTTLES DRAWN AEROBIC ONLY 5 CC   Final   Culture  Setup Time     Final   Value: 04/22/2013 01:46     Performed at [facility]   Culture     Final   Value:        BLOOD CULTURE RECEIVED NO GROWTH TO DATE CULTURE WILL BE HELD FOR 5 DAYS BEFORE ISSUING A FINAL NEGATIVE REPORT     Performed at [facility]   Report Status PENDING   Incomplete  CULTURE, BLOOD (ROUTINE X 2)     Status: None   Collection Time    04/21/13  8:00 PM      Result Value Range Status   Specimen Description BLOOD LEFT ARM   Final   Special Requests BOTTLES DRAWN AEROBIC ONLY 10 CC   Final   Culture  Setup Time     Final   Value: 04/22/2013 01:46     Performed at [facility]   Culture     Final   Value:        BLOOD CULTURE RECEIVED NO GROWTH TO DATE CULTURE WILL BE HELD FOR 5 DAYS BEFORE ISSUING A FINAL NEGATIVE REPORT     Performed at [facility]   Report Status PENDING   Incomplete  CULTURE, BLOOD (ROUTINE X 2)     Status: None   Collection Time    04/22/13  6:40 AM      Result Value Range Status   Specimen Description BLOOD LEFT FOREARM   Final   Special Requests BOTTLES DRAWN AEROBIC AND ANAEROBIC 10CC   Final  Culture  Setup Time     Final   Value: 04/22/2013 14:41     Performed at [facility]   Culture     Final   Value:        BLOOD CULTURE RECEIVED NO GROWTH TO DATE CULTURE WILL BE HELD FOR 5 DAYS BEFORE ISSUING A FINAL NEGATIVE REPORT     Performed at [facility]   Report Status PENDING   Incomplete  CULTURE, BLOOD (SINGLE)     Status: None   Collection Time     04/22/13 11:05 PM      Result Value Range Status   Specimen Description BLOOD LEFT ARM   Final   Special Requests BOTTLES DRAWN AEROBIC AND ANAEROBIC 5CC   Final   Culture  Setup Time     Final   Value: 04/23/2013 04:22     Performed at [facility]   Culture     Final   Value:        BLOOD CULTURE RECEIVED NO GROWTH TO DATE CULTURE WILL BE HELD FOR 5 DAYS BEFORE ISSUING A FINAL NEGATIVE REPORT     Performed at [facility]   Report Status PENDING   Incomplete    Studies/Results: Ct Chest W Contrast  04/23/2013   CLINICAL DATA:  Fever of unknown origin, concern for malignancy.  EXAM: CT CHEST, ABDOMEN, AND PELVIS WITH CONTRAST  TECHNIQUE: Multidetector CT imaging of the chest, abdomen and pelvis was performed following the standard protocol during bolus administration of intravenous contrast.  CONTRAST:  OMNIPAQUE IOHEXOL 300 MG/ML  SOLN  COMPARISON:  CT 07/02/2008  FINDINGS: CT CHEST FINDINGS  No axillary or supraclavicular lymphadenopathy. No mediastinal hilar lymphadenopathy. No pericardial fluid. Coronary calcifications are present. Esophagus is normal.  Review of the lung windows demonstrates linear atelectasis in the left and right lower lobes. No evidence of consolidation or pneumonia. There is mild centrilobular emphysema in the upper lobes.  CT ABDOMEN AND PELVIS FINDINGS  No focal hepatic lesion. Gallbladder contains a tiny gallstone. No evidence of gallbladder inflammation. Pancreas, spleen, adrenal glands, kidneys are normal. The stomach, small bowel, and cecum are normal. Colon and rectosigmoid colon are normal. Abdominal or is normal caliber.  There is no periportal lymphadenopathy. There is enlarged lymph node at the left common iliac nodal station measuring 18 mm. There is haziness to the fat surrounding this lymph node. No evidence of more deep pelvic lymphadenopathy. Small high periaortic lymph nodes which are not pathologic by size criteria but do have mild  haziness surrounding. The largest measures 9 mm between the IVC and aorta (image 88).  Prostate gland and bladder normal. Vascular calcifications noted. There is a stent of some sort in the right groin. No aggressive osseous lesion.  IMPRESSION:  1. No evidence infection or malignancy in the thorax. Bibasilar atelectasis noted.  2. Enlarged and mildly inflamed lymph node in the left common iliac location. Legrand Rams this to represent reactive adenopathy over malignancy but cannot exclude malignant lymph node including lymphoma. An outpatient FDG PET scan may be beneficial.  3. No evidence of source of infection in the abdomen or pelvis. There are additional smaller periaortic lymph nodes which are mildly inflamed.   Electronically Signed   By: Genevive Bi M.D.   On: 04/23/2013 16:20   Ct Abdomen Pelvis W Contrast  04/23/2013   CLINICAL DATA:  Fever of unknown origin, concern for malignancy.  EXAM: CT CHEST, ABDOMEN, AND PELVIS WITH CONTRAST  TECHNIQUE: Multidetector CT imaging of the chest, abdomen and pelvis was performed following the standard protocol during bolus administration of intravenous contrast.  CONTRAST:  OMNIPAQUE IOHEXOL 300 MG/ML  SOLN  COMPARISON:  CT 07/02/2008  FINDINGS: CT CHEST FINDINGS  No axillary or supraclavicular lymphadenopathy. No mediastinal hilar lymphadenopathy. No pericardial fluid. Coronary calcifications are present. Esophagus is normal.  Review of the lung windows demonstrates linear atelectasis in the left and right lower lobes. No evidence of consolidation or pneumonia. There is mild centrilobular emphysema in the upper lobes.  CT ABDOMEN AND PELVIS FINDINGS  No focal hepatic lesion. Gallbladder contains a tiny gallstone. No evidence of gallbladder inflammation. Pancreas, spleen, adrenal glands, kidneys are normal. The stomach, small bowel, and cecum are normal. Colon and rectosigmoid colon are normal. Abdominal or is normal caliber.  There is no periportal  lymphadenopathy. There is enlarged lymph node at the left common iliac nodal station measuring 18 mm. There is haziness to the fat surrounding this lymph node. No evidence of more deep pelvic lymphadenopathy. Small high periaortic lymph nodes which are not pathologic by size criteria but do have mild haziness surrounding. The largest measures 9 mm between the IVC and aorta (image 88).  Prostate gland and bladder normal. Vascular calcifications noted. There is a stent of some sort in the right groin. No aggressive osseous lesion.  IMPRESSION:  1. No evidence infection or malignancy in the thorax. Bibasilar atelectasis noted.  2. Enlarged and mildly inflamed lymph node in the left common iliac location. Legrand Rams this to represent reactive adenopathy over malignancy but cannot exclude malignant lymph node including lymphoma. An outpatient FDG PET scan may be beneficial.  3. No evidence of source of infection in the abdomen or pelvis. There are additional smaller periaortic lymph nodes which are mildly inflamed.   Electronically Signed   By: Genevive Bi M.D.   On: 04/23/2013 16:20     Assessment/Plan: Fever  HIV+  DM2  PVD  Total days of antibiotics: off 48h    CT L iliac node, FDG/PET study as outpt.  His temps have resolved.  Would consider outpt f/u.  available if needed.        Johny Sax Infectious Diseases (pager) [phone] [email or web address] 04/24/2013, 3:00 PM  LOS: 3 days

## 2013-04-24 NOTE — Progress Notes (Signed)
  Date: 04/24/2013  Patient name: ANTHEM FRAZER  Medical record number: [id number]  Date of birth: Jul 04, 1950   This patient has been seen and the plan of care was discussed with the house staff. Please see their note for complete details. I concur with their findings with the following additions/corrections:  CT scan as noted in Dr. Doristine Locks note, he does have an enlarged and inflamed LN in the left common iliac location, will need outpatient PET.  He has been afebrile > 24 hours.  If he remains so in the AM, will plan for discharge with outpatient follow up PET scan.  Please see Dr. Doristine Locks note for thorough list of studies ordered and resulted.   Inez Catalina, MD 04/24/2013, 4:26 PM

## 2013-04-25 ENCOUNTER — Other Ambulatory Visit: Payer: Self-pay | Admitting: Internal Medicine

## 2013-04-25 DIAGNOSIS — R590 Localized enlarged lymph nodes: Secondary | ICD-10-CM

## 2013-04-25 LAB — GLUCOSE, CAPILLARY: Glucose-Capillary: 144 mg/dL — ABNORMAL HIGH (ref 70–99)

## 2013-04-25 NOTE — Progress Notes (Signed)
Patient was recently admitted to this hospital with fever of unknown origin. CT revealed a enlarged and mildly inflamed lymph node in the left common iliac location. I have ordered an outpatient PET scan referral for workup of this to rule out lymphoma. Please let me know if you have any questions.  Vivi Barrack, MD  [email or web address] Pager # [phone] Office # [phone]

## 2013-04-25 NOTE — Discharge Summary (Signed)
Name: Nathan [name] MRN: [id number] DOB: Feb 06, 1951 62 y.o. PCP: Annett Gula, MD  Date of Admission: 04/21/2013  3:47 PM Date of Discharge: 04/25/2013 Attending Physician: Inez Catalina, MD  Discharge Diagnosis: 1. Fever of unknown origin 2. Chronic macrocytic anemia 3. Hypotension, resolved 4. DM2 5. HIV 6. HLD  Discharge Medications:   Medication List    STOP taking these medications       atorvastatin 40 MG tablet  Commonly known as:  LIPITOR      TAKE these medications       BAYER CONTOUR MONITOR W/DEVICE Kit  Use to check blood sugar as instructed up to 4 times a day     clopidogrel 75 MG tablet  Commonly known as:  PLAVIX  Take 1 tablet (75 mg total) by mouth daily.     glucose blood test strip  Commonly known as:  BAYER CONTOUR NEXT TEST  Four times daily before meals and at bedtime, insulin dependent, ICD CODE 250.02     HUMALOG KWIKPEN 100 UNIT/ML Sopn  Generic drug:  insulin lispro  Inject 14-24 Units into the skin 3 (three) times daily with meals. 14 units with breakfast; 24 units with lunch and dinner     insulin glargine 100 UNIT/ML injection  Commonly known as:  LANTUS  Inject 65 Units into the skin at bedtime.     lamiVUDine-zidovudine 150-300 MG per tablet  Commonly known as:  COMBIVIR  Take 1 tablet by mouth 2 (two) times daily.     Lancets Misc  - Use to Test Blood Sugar Three Times Daily.  - Dx Code: 250.02     lisinopril 40 MG tablet  Commonly known as:  PRINIVIL,ZESTRIL  Take 40 mg by mouth every evening.     metFORMIN 500 MG tablet  Commonly known as:  GLUCOPHAGE  Take 1,000 mg by mouth 2 (two) times daily with a meal.     omeprazole 40 MG capsule  Commonly known as:  PRILOSEC  Take 1 capsule (40 mg total) by mouth daily.     Pen Needles 3/16" 31G X 5 MM Misc  - 1 application by Does not apply route 4 (four) times daily. Use to inject insulin twice daily  - Dx code 250.00     BD PEN NEEDLE NANO U/F 32G X 4 MM Misc    Generic drug:  Insulin Pen Needle  USE AS DIRECTED FOUR TIMES DAILY     raltegravir 400 MG tablet  Commonly known as:  ISENTRESS  Take 1 tablet (400 mg total) by mouth 2 (two) times daily.     tenofovir 300 MG tablet  Commonly known as:  VIREAD  Take 1 tablet (300 mg total) by mouth daily.     TYLENOL EXTRA STRENGTH PO  Take 2 tablets by mouth 2 (two) times daily as needed (fever).        Disposition and follow-up:   Nathan [name] was discharged from [facility] in Good condition.  At the hospital follow up visit please address:  1.  Fever recurrence? Other B symptoms? Alternative medicines for HLD (had to d/c statin)?  2.  Labs / imaging needed at time of follow-up: PET scan (referral placed), BMP (for Ca), CBC (for pancytopenia)  3.  Pending labs/ test needing follow-up: Serum protein electrophoresis still pending  Follow-up Appointments: Follow-up Information   Follow up with Annett Gula, MD On 05/22/2013. (@2 :45pm. This is your primary care doctor.)  Specialty:  Internal Medicine   Contact information:   [address] [place] [address] [phone]       Discharge Instructions: Discharge Orders   Future Appointments Provider Department Dept Phone   05/22/2013 2:45 PM Annett Gula, MD [facility] [phone]   07/23/2013 9:15 AM [facility] for Infectious Disease [phone]   08/04/2013 10:15 AM Randall Hiss, MD [facility] for Infectious Disease [phone]   10/13/2013 2:00 PM [facility] [place] [facility] [address] [phone]   10/13/2013 2:30 PM [facility] [place] [facility] [address] [phone]   10/13/2013 3:00 PM Carma Lair Nickel, NP [facility] [phone]   Future Orders Complete By Expires   Diet - low sodium heart healthy  As directed    Increase activity slowly  As directed        Consultations:  ID  Procedures Performed:  Dg Chest 2 View  04/21/2013   CLINICAL DATA:  Fever of unknown origin for 2 weeks, cough, shortness of breath, question pneumonia, history diabetes, HIV, hypertension, asthma  EXAM: CHEST  2 VIEW  COMPARISON:  09/03/2012  FINDINGS: Upper normal heart size.  Mediastinal contours and pulmonary vascularity normal.  Minimal atelectasis left base.  Lungs otherwise clear.  No pleural effusion or pneumothorax.  No acute osseous findings.  IMPRESSION: Minimal atelectasis left base.   Electronically Signed   By: Ulyses Southward M.D.   On: 04/21/2013 19:47   Ct Chest W Contrast  04/23/2013   CLINICAL DATA:  Fever of unknown origin, concern for malignancy.  EXAM: CT CHEST, ABDOMEN, AND PELVIS WITH CONTRAST  TECHNIQUE: Multidetector CT imaging of the chest, abdomen and pelvis was performed following the standard protocol during bolus administration of intravenous contrast.  CONTRAST:  OMNIPAQUE IOHEXOL 300 MG/ML  SOLN  COMPARISON:  CT 07/02/2008  FINDINGS: CT CHEST FINDINGS  No axillary or supraclavicular lymphadenopathy. No mediastinal hilar lymphadenopathy. No pericardial fluid. Coronary calcifications are present. Esophagus is normal.  Review of the lung windows demonstrates linear atelectasis in the left and right lower lobes. No evidence of consolidation or pneumonia. There is mild centrilobular emphysema in the upper lobes.  CT ABDOMEN AND PELVIS FINDINGS  No focal hepatic lesion. Gallbladder contains a tiny gallstone. No evidence of gallbladder inflammation. Pancreas, spleen, adrenal glands, kidneys are normal. The stomach, small bowel, and cecum are normal. Colon and rectosigmoid colon are normal. Abdominal or is normal caliber.  There is no periportal lymphadenopathy. There is enlarged lymph node at the left common iliac nodal station measuring 18 mm. There is haziness to the fat surrounding this lymph node. No evidence of more deep pelvic lymphadenopathy.  Small high periaortic lymph nodes which are not pathologic by size criteria but do have mild haziness surrounding. The largest measures 9 mm between the IVC and aorta (image 88).  Prostate gland and bladder normal. Vascular calcifications noted. There is a stent of some sort in the right groin. No aggressive osseous lesion.  IMPRESSION:  1. No evidence infection or malignancy in the thorax. Bibasilar atelectasis noted.  2. Enlarged and mildly inflamed lymph node in the left common iliac location. Legrand Rams this to represent reactive adenopathy over malignancy but cannot exclude malignant lymph node including lymphoma. An outpatient FDG PET scan may be beneficial.  3. No evidence of source of infection in the abdomen or pelvis. There are additional smaller periaortic lymph nodes which are  mildly inflamed.   Electronically Signed   By: Genevive Bi M.D.   On: 04/23/2013 16:20   Ct Abdomen Pelvis W Contrast  04/23/2013   CLINICAL DATA:  Fever of unknown origin, concern for malignancy.  EXAM: CT CHEST, ABDOMEN, AND PELVIS WITH CONTRAST  TECHNIQUE: Multidetector CT imaging of the chest, abdomen and pelvis was performed following the standard protocol during bolus administration of intravenous contrast.  CONTRAST:  OMNIPAQUE IOHEXOL 300 MG/ML  SOLN  COMPARISON:  CT 07/02/2008  FINDINGS: CT CHEST FINDINGS  No axillary or supraclavicular lymphadenopathy. No mediastinal hilar lymphadenopathy. No pericardial fluid. Coronary calcifications are present. Esophagus is normal.  Review of the lung windows demonstrates linear atelectasis in the left and right lower lobes. No evidence of consolidation or pneumonia. There is mild centrilobular emphysema in the upper lobes.  CT ABDOMEN AND PELVIS FINDINGS  No focal hepatic lesion. Gallbladder contains a tiny gallstone. No evidence of gallbladder inflammation. Pancreas, spleen, adrenal glands, kidneys are normal. The stomach, small bowel, and cecum are normal. Colon and  rectosigmoid colon are normal. Abdominal or is normal caliber.  There is no periportal lymphadenopathy. There is enlarged lymph node at the left common iliac nodal station measuring 18 mm. There is haziness to the fat surrounding this lymph node. No evidence of more deep pelvic lymphadenopathy. Small high periaortic lymph nodes which are not pathologic by size criteria but do have mild haziness surrounding. The largest measures 9 mm between the IVC and aorta (image 88).  Prostate gland and bladder normal. Vascular calcifications noted. There is a stent of some sort in the right groin. No aggressive osseous lesion.  IMPRESSION:  1. No evidence infection or malignancy in the thorax. Bibasilar atelectasis noted.  2. Enlarged and mildly inflamed lymph node in the left common iliac location. Legrand Rams this to represent reactive adenopathy over malignancy but cannot exclude malignant lymph node including lymphoma. An outpatient FDG PET scan may be beneficial.  3. No evidence of source of infection in the abdomen or pelvis. There are additional smaller periaortic lymph nodes which are mildly inflamed.   Electronically Signed   By: Genevive Bi M.D.   On: 04/23/2013 16:20    2D Echo:  04/22/13 ------------------------------------------------------------ Study Conclusions  - Left ventricle: The cavity size was normal. Wall thickness was at the upper limits of normal. Systolic function was normal. The estimated ejection fraction was in the range of 60% to 65%. Left ventricular diastolic function parameters were normal. - Left atrium: The atrium was mildly dilated. - Atrial septum: No defect or patent foramen ovale was identified. Transthoracic echocardiography. M-mode, complete 2D, spectral Doppler, and color Doppler. Height: Height: 185.4cm. Height: 73in. Weight: Weight: 73.2kg. Weight: 161lb. Body mass index: BMI: 21.3kg/m^2. Body surface area: BSA: 1.81m^2. Blood pressure: 98/54. Patient status:  Inpatient. Location: Echo laboratory.  ------------------------------------------------------------    Admission HPI:  Nathan [name] is a 62 y.o. male PMH DM2, PVD s/p right AKA, well controlled HIV (CD4 655, viral load <40), dyslipidemia, HTN, GERD, who presents as a direct admission from Infectious Disease clinic with a chief complaint of fever.  Patient reports his symptoms began the day of the AIDS walk on December 4, about 2 weeks ago. Since then he has had progressive malaise, fever, chills, headaches, and joint stiffness. He measured his temperature at home, and it was 102. His headache is on the right side of his head. He has been taking Tylenol at home with minimal symptomatic relief. He notes one  episode of left-sided flank pain one week ago. He thought this might be a sign of urinary tract infection, so he drank cranberry juice. The pain resolved the next day and has not returned.  He denies shortness of breath, cough, chest pain, abdominal pain, nausea, vomiting, diarrhea, visual changes, neck stiffness, dysuria, myalgias, rash. Denies sick contacts. He reports his appetite has been steadily decreasing over the past 2 weeks, but he is hungry right now. He has been taking his regular insulin dose which is Lantus 65 units at bedtime and Humalog 14 units at breakfast, 24 units with lunch and dinner. He reports no hypoglycemia, but rather sugars in the 90s which is low for him. He did have a flu shot this year.  Also of note, he was recently enrolled in an AIDS Clinical Trial Group study# [id number] looking at low dose methotrexate (LDMTX) for the treatment of HIV-associated inflammation. The purpose of this study is to learn if treatment with LDMTX can lower the risk of heart disease by lowering HIV-related inflammation and to see how safe LDMTX is when used in people with HIV infection. He started getting 5mg /week in 5/14 which was uptitrated over several weeks to a maximum dose of 15mg /week. His  last dose of methotrexate/placebo was around 10/28.   Physical Exam:  Blood pressure 130/63, pulse 91, temperature 99.9 F (37.7 C), temperature source Oral, resp. rate 18, height 6\' 1"  (1.854 m), weight 157 lb 11.2 oz (71.532 kg), SpO2 97.00%.  Physical Exam  Constitutional: He is oriented to person, place, and time and well-developed, well-nourished, and in no distress.  Very pleasant and conversant.  HENT:  Head: Normocephalic and atraumatic.  Eyes: Conjunctivae and EOM are normal. Pupils are equal, round, and reactive to light.  Neck: Normal range of motion. Neck supple.  No meningismus  Cardiovascular: Normal rate, regular rhythm and normal heart sounds. Exam reveals no gallop and no friction rub.  No murmur heard.  Pulmonary/Chest: Effort normal and breath sounds normal. No respiratory distress. He has no wheezes. He has no rales. He exhibits no tenderness.  Abdominal: Soft. He exhibits no distension. There is no tenderness.  Musculoskeletal: Normal range of motion. He exhibits no edema and no tenderness.  Status post right AKA. Stump is clean, dry, intact, nonerythematous.  Lymphadenopathy:  He has no cervical adenopathy.  Neurological: He is alert and oriented to person, place, and time. No cranial nerve deficit. GCS score is 15.  Skin: Skin is warm and dry. No rash noted.  Psychiatric: Affect normal.    Hospital Course by problem list: Nathan [name] is a 62 y.o. male PMH DM2, PVD s/p right AKA, well controlled HIV (CD4 655, viral load <40), dyslipidemia, HTN, GERD, who presents as a direct admission from Infectious Disease clinic with a chief complaint of fever, malaise, and headache x2 weeks.   1. Fever of unknown origin - Patient with recurrent fevers to 101-102 during admission. Complained of 2 weeks of associated malaise, myalgias, and headache. Exam was consistently nonfocal. UA was clean. Chest x-ray > no acute abnormalities except minimal atelectasis left base. 2D echo  > EF 60-65%, no vegetation mentioned, no significant valve regurgitation. No stigmata of endocarditis on exam. Influenza PCR > A and B negative. Multiple blood cultures all with no growth to date. ID was consulted and agreed his fevers did not appear infectious. We stopped vancomycin and zosyn after 2 days of therapy. He had no eosinophilia to suggest DRESS. TSH > 1.42 (wnl). Most  likely his statin was the culprit, as his fevers resolved after Lipitor was discontinued. His CK > 434 (elevated). That being said, his blood smear was remarkable for polychromasia with teardrop cells per technician review, giving [place] some concern for myeloproliferative disorder. (However, pathologist review of the same specimen only noted neutropenia and macrocytic anemia.) He did have recent weight loss: weight was 172lbs on 10/26, it was 160lbs here. There was some suggestion of pancytopenia with WBC of 3.4 (baseline 6) and hemoglobin of ~9 (baseline 12-13). No peripheral lymphadenopathy was noted. CT chest, abdomen, and pelvis with contrast was performed to search for a malignancy. It showed: No evidence of frank infection or malignancy. Enlarged and mildly inflamed lymph node in the left common iliac location favored to represent reactive adenopathy over malignancy but cannot exclude malignant lymph node including lymphoma. Additional smaller periaortic lymph nodes which are mildly inflamed. He will need outpatient FDG PET scan to work this up. Uric acid > 2.8 (low). LDH > 289 (mildly elevated). Serum protein electrophoresis > still in process. He was discharged in stable condition after 48 hours without fevers.  2. Chronic macrocytic anemia - MCV 102.9. This is chronic per chart review. He takes Combivir, which contains zidovidine, a known cause of macrocytic anemia. Will ruled out B12 deficiency since no results in the system and easily correctable. Vitamin B12 > 347 (wnl).  3. Hypotension - Patient was hypotensive to 80-90s  systolic during this admission. Resolved with IVF resuscitation NS @150cc /hr and holding his home lisinopril. He reported decrease po intake prior to admission, so hypovolemia was likely the etiology. On discharge, BP 136/83 and ACE-I restarted.  4. DM2 - CBG 95 on admission. Last A1c 7.5 on 12/05/2012. We gave Lantus 15 units daily at bedtime and sensitive sliding scale insulin. We checked hemoglobin A1c since he is due > 7.7.  5. HIV - Well controlled on Combivir 150-300 mg twice a day, raltegravir 400 mg twice a day, tenofovir 300 mg daily. CD4 655, viral load <40 on 03/04/13.   6. HLD - Discontinued statin as above over concern for drug fever and myopathy.     Discharge Vitals:   BP 136/83  Pulse 68  Temp(Src) 97.6 F (36.4 C) (Oral)  Resp 20  Ht 6\' 1"  (1.854 m)  Wt 159 lb 1.6 oz (72.167 kg)  BMI 21.00 kg/m2  SpO2 99%  Discharge Labs:  Results for orders placed during the hospital encounter of 04/21/13 (from the past 24 hour(s))  GLUCOSE, CAPILLARY     Status: Abnormal   Collection Time    04/24/13  5:18 PM      Result Value Range   Glucose-Capillary 199 (*) 70 - 99 mg/dL  GLUCOSE, CAPILLARY     Status: Abnormal   Collection Time    04/24/13  8:39 PM      Result Value Range   Glucose-Capillary 222 (*) 70 - 99 mg/dL  GLUCOSE, CAPILLARY     Status: Abnormal   Collection Time    04/25/13  7:33 AM      Result Value Range   Glucose-Capillary 144 (*) 70 - 99 mg/dL    Signed: Vivi Barrack, MD 04/25/2013, 11:25 AM   Time Spent on Discharge: 40 minutes Services Ordered on Discharge: None Equipment Ordered on Discharge: None

## 2013-04-25 NOTE — Progress Notes (Signed)
Discharge instructions provided. Teach back to verify understanding of follow up appointments and medication to be stopped. Pt Verbalized understanding. No new medications ordered. No Rx scripts provided or needed. Pt verified having all equipment needs for blood sugar monitoring and insulin administration. IV site discontinued. Telemetry discontinued. Volunteer called to escort pt out via wheelchair. Humberto Seals, RN 04/25/2013

## 2013-04-25 NOTE — Progress Notes (Signed)
Physical Therapy Treatment Patient Details Name: Nathan [name] MRN: [id number] DOB: [birth year] Today's Date: 04/25/2013 Time: 9811-9147 PT Time Calculation (min): 9 min  PT Assessment / Plan / Recommendation  History of Present Illness Pt is a 62 y.o. male adm from home due to fever. PMH includes DM2; PVD, HIV.     PT Comments   Pt demo good technique and safety with mobility. Session focused on ambulating up/down flight of steps to ensure safe transition home. No follow up PT needed. Pt safe from mobility standpoint to D/C.   Follow Up Recommendations  No PT follow up;Supervision - Intermittent     Does the patient have the potential to tolerate intense rehabilitation     Barriers to Discharge        Equipment Recommendations  None recommended by PT    Recommendations for Other Services    Frequency Min 3X/week   Progress towards PT Goals Progress towards PT goals: Goals met/education completed, patient discharged from PT  Plan Current plan remains appropriate    Precautions / Restrictions Precautions Precautions: None Required Braces or Orthoses: Other Brace/Splint Other Brace/Splint: Rt prosthesis  Restrictions Weight Bearing Restrictions: No   Pertinent Vitals/Pain No new complaints     Mobility  Bed Mobility Bed Mobility: Supine to Sit;Sitting - Scoot to Edge of Bed;Sit to Supine Supine to Sit: 7: Independent Sitting - Scoot to Edge of Bed: 7: Independent Sit to Supine: 7: Independent Transfers Transfers: Sit to Stand;Stand to Sit Sit to Stand: 6: Modified independent (Device/Increase time);From bed Stand to Sit: 6: Modified independent (Device/Increase time);To bed Details for Transfer Assistance: demo good technique with incr time  Ambulation/Gait Ambulation/Gait Assistance: 6: Modified independent (Device/Increase time) Ambulation Distance (Feet): 200 Feet Assistive device: None Ambulation/Gait Assistance Details: pt demo good technique with gt; no  physical (A) needed and no cues for safety; pt continues to circumduct Rt LE states this is normal  Gait Pattern: Decreased stride length;Right circumduction;Right hip hike;Wide base of support Gait velocity: WFL Stairs: Yes Stairs Assistance: 6: Modified independent (Device/Increase time) Stair Management Technique: One rail Left;Forwards;Step to pattern Number of Stairs: 14         PT Diagnosis:    PT Problem List:   PT Treatment Interventions:     PT Goals (current goals can now be found in the care plan section) Acute Rehab PT Goals Patient Stated Goal: home today  PT Goal Formulation: With patient Time For Goal Achievement: 05/01/13 Potential to Achieve Goals: Good  Visit Information  Last PT Received On: 04/25/13 Assistance Needed: +1 History of Present Illness: Pt is a 62 y.o. male adm from home due to fever. PMH includes     Subjective Data  Subjective: pt lying supine; eager and agreeable to therapy  Patient Stated Goal: home today    Cognition  Cognition Arousal/Alertness: Awake/alert Behavior During Therapy: WFL for tasks assessed/performed Overall Cognitive Status: Within Functional Limits for tasks assessed    Balance  Balance Balance Assessed: Yes Static Standing Balance Static Standing - Balance Support: No upper extremity supported;During functional activity Static Standing - Level of Assistance: 6: Modified independent (Device/Increase time) High Level Balance High Level Balance Activites: Direction changes;Turns High Level Balance Comments: demo good technique   End of Session PT - End of Session Equipment Utilized During Treatment: Other (comment) (Rt LE prothesis ) Activity Tolerance: Patient tolerated treatment well Patient left: in bed;with call bell/phone within reach Nurse Communication: Mobility status   GP  Nathan [name] [place], [place] [phone] 04/25/2013, 12:19 PM

## 2013-04-25 NOTE — Progress Notes (Signed)
Subjective: Patient seen and examined at the bedside this morning. He is sitting upright and eating breakfast. He feels great. No fevers overnight.   Objective: Vital signs in last 24 hours: Filed Vitals:   04/24/13 1300 04/24/13 1720 04/24/13 2043 04/25/13 0545  BP: 114/65 131/68 129/68 103/65  Pulse: 65 77 73 68  Temp: 97 F (36.1 C) 97.7 F (36.5 C) 98.6 F (37 C) 98.2 F (36.8 C)  TempSrc: Oral Oral Oral Oral  Resp: 18 18 18 18   Height:      Weight:   159 lb 1.6 oz (72.167 kg)   SpO2: 95% 98% 94% 96%   Weight change: -1 lb 6.4 oz (-0.635 kg)  Intake/Output Summary (Last 24 hours) at 04/25/13 0850 Last data filed at 04/25/13 1610  Gross per 24 hour  Intake    960 ml  Output   2200 ml  Net  -1240 ml   Physical Exam  Constitutional: He is oriented to person, place, and time and well-developed, well-nourished, and in no distress.  Very pleasant and conversant.  HENT:  Head: Normocephalic and atraumatic.  Eyes: Conjunctivae and EOM are normal. Pupils are equal, round, and reactive to light.  Neck: Normal range of motion. Neck supple.  No meningismus  Cardiovascular: Normal rate, regular rhythm and normal heart sounds. Exam reveals no gallop and no friction rub.  No murmur heard.  Pulmonary/Chest: Effort normal and breath sounds normal. No respiratory distress. He has no wheezes. He has no rales. He exhibits no tenderness.  Abdominal: Soft. He exhibits no distension. There is no tenderness.  Musculoskeletal: Normal range of motion. He exhibits no edema and no tenderness.  Status post right AKA. Stump is clean, dry, intact, nonerythematous.  Lymphadenopathy:  He has no cervical adenopathy.  Neurological: He is alert and oriented to person, place, and time. No cranial nerve deficit. GCS score is 15.  Skin: Skin is warm and dry. No rash noted. No Janeway lesions or splinter hemorrhages noted on hands or feet. Psychiatric: Affect normal.   Lab Results: Basic Metabolic  Panel:  Recent Labs Lab 04/23/13 0550 04/24/13 0547  NA 134* 135  K 3.8 4.1  CL 100 102  CO2 24 24  GLUCOSE 121* 164*  BUN 9 8  CREATININE 0.96 0.71  CALCIUM 8.1* 8.5   Liver Function Tests:  Recent Labs Lab 04/21/13 1957  AST 56*  ALT 33  ALKPHOS 119*  BILITOT 0.5  PROT 7.2  ALBUMIN 3.4*   No results found for this basename: LIPASE, AMYLASE,  in the last 168 hours No results found for this basename: AMMONIA,  in the last 168 hours CBC:  Recent Labs Lab 04/21/13 1957  04/23/13 0550 04/24/13 0547  WBC 4.3  < > 3.8* 4.0  NEUTROABS 1.8  --   --   --   HGB 10.2*  < > 8.9* 9.4*  HCT 30.8*  < > 27.2* 28.7*  MCV 102.3*  < > 104.2* 102.9*  PLT 181  < > 182 204  < > = values in this interval not displayed. Cardiac Enzymes:  Recent Labs Lab 04/22/13 1700  CKTOTAL 434*   BNP: No results found for this basename: PROBNP,  in the last 168 hours D-Dimer: No results found for this basename: DDIMER,  in the last 168 hours CBG:  Recent Labs Lab 04/23/13 2133 04/24/13 0730 04/24/13 1125 04/24/13 1718 04/24/13 2039 04/25/13 0733  GLUCAP 219* 141* 190* 199* 222* 144*   Hemoglobin A1C:  Recent Labs Lab 04/21/13 1957  HGBA1C 7.7*   Fasting Lipid Panel: No results found for this basename: CHOL, HDL, LDLCALC, TRIG, CHOLHDL, LDLDIRECT,  in the last 168 hours Thyroid Function Tests:  Recent Labs Lab 04/21/13 1957  TSH 1.420   Coagulation: No results found for this basename: LABPROT, INR,  in the last 168 hours Anemia Panel:  Recent Labs Lab 04/24/13 0936  VITAMINB12 347   Urine Drug Screen: Drugs of Abuse  No results found for this basename: labopia,  cocainscrnur,  labbenz,  amphetmu,  thcu,  labbarb    Alcohol Level: No results found for this basename: ETH,  in the last 168 hours Urinalysis:  Recent Labs Lab 04/21/13 2100  COLORURINE AMBER*  LABSPEC 1.024  PHURINE 5.5  GLUCOSEU NEGATIVE  HGBUR NEGATIVE  BILIRUBINUR SMALL*  KETONESUR  15*  PROTEINUR NEGATIVE  UROBILINOGEN 4.0*  NITRITE NEGATIVE  LEUKOCYTESUR NEGATIVE    Micro Results: Results for orders placed during the hospital encounter of 04/21/13  MRSA PCR SCREENING     Status: None   Collection Time    04/21/13  7:51 PM      Result Value Range Status   MRSA by PCR NEGATIVE  NEGATIVE Final   Comment:            The GeneXpert MRSA Assay (FDA     approved for NASAL specimens     only), is one component of a     comprehensive MRSA colonization     surveillance program. It is not     intended to diagnose MRSA     infection nor to guide or     monitor treatment for     MRSA infections.  CULTURE, BLOOD (ROUTINE X 2)     Status: None   Collection Time    04/21/13  7:57 PM      Result Value Range Status   Specimen Description BLOOD RIGHT ARM   Final   Special Requests BOTTLES DRAWN AEROBIC ONLY 5 CC   Final   Culture  Setup Time     Final   Value: 04/22/2013 01:46     Performed at [facility]   Culture     Final   Value:        BLOOD CULTURE RECEIVED NO GROWTH TO DATE CULTURE WILL BE HELD FOR 5 DAYS BEFORE ISSUING A FINAL NEGATIVE REPORT     Performed at [facility]   Report Status PENDING   Incomplete  CULTURE, BLOOD (ROUTINE X 2)     Status: None   Collection Time    04/21/13  8:00 PM      Result Value Range Status   Specimen Description BLOOD LEFT ARM   Final   Special Requests BOTTLES DRAWN AEROBIC ONLY 10 CC   Final   Culture  Setup Time     Final   Value: 04/22/2013 01:46     Performed at [facility]   Culture     Final   Value:        BLOOD CULTURE RECEIVED NO GROWTH TO DATE CULTURE WILL BE HELD FOR 5 DAYS BEFORE ISSUING A FINAL NEGATIVE REPORT     Performed at [facility]   Report Status PENDING   Incomplete  CULTURE, BLOOD (ROUTINE X 2)     Status: None   Collection Time    04/22/13  6:40 AM      Result Value Range Status   Specimen Description BLOOD  LEFT FOREARM   Final   Special Requests BOTTLES  DRAWN AEROBIC AND ANAEROBIC 10CC   Final   Culture  Setup Time     Final   Value: 04/22/2013 14:41     Performed at [facility]   Culture     Final   Value:        BLOOD CULTURE RECEIVED NO GROWTH TO DATE CULTURE WILL BE HELD FOR 5 DAYS BEFORE ISSUING A FINAL NEGATIVE REPORT     Performed at [facility]   Report Status PENDING   Incomplete  CULTURE, BLOOD (SINGLE)     Status: None   Collection Time    04/22/13 11:05 PM      Result Value Range Status   Specimen Description BLOOD LEFT ARM   Final   Special Requests BOTTLES DRAWN AEROBIC AND ANAEROBIC 5CC   Final   Culture  Setup Time     Final   Value: 04/23/2013 04:22     Performed at [facility]   Culture     Final   Value:        BLOOD CULTURE RECEIVED NO GROWTH TO DATE CULTURE WILL BE HELD FOR 5 DAYS BEFORE ISSUING A FINAL NEGATIVE REPORT     Performed at [facility]   Report Status PENDING   Incomplete    Recent Results (from the past 240 hour(s))  INFLUENZA A & B PCR     Status: None   Collection Time    04/21/13  2:30 PM      Result Value Range Status   Source Not Provided   Final   Influenza A NOT DETECTED   Final   Influenza B NOT DETECTED   Final   Comment:           **Normal Reference Range for each Analyte: Not Detected**           Testing performed using the Lum[employer] xTAG Respiratory Viral Panel test     kit.           The performance characteristics of this assay were determined by     [facility]. This test has not been cleared or approved by     the U.S. Food and Drug Administration (FDA) for this specimen type.     The FDA has determined that such clearance or approval is not     necessary. This test is used for clinical purposes. It should be     regarded as investigational or for research. This laboratory is     certified under the Clinical Laboratory Improvement Amendments of 1988     (CLIA-88) as qualified to perform high complexity testing.  MRSA PCR  SCREENING     Status: None   Collection Time    04/21/13  7:51 PM      Result Value Range Status   MRSA by PCR NEGATIVE  NEGATIVE Final   Comment:            The GeneXpert MRSA Assay (FDA     approved for NASAL specimens     only), is one component of a     comprehensive MRSA colonization     surveillance program. It is not     intended to diagnose MRSA     infection nor to guide or     monitor treatment for     MRSA infections.   Studies/Results: Ct Chest W Contrast  04/23/2013   CLINICAL DATA:  Fever of unknown origin, concern for malignancy.  EXAM: CT CHEST, ABDOMEN, AND PELVIS WITH CONTRAST  TECHNIQUE: Multidetector CT imaging of the chest, abdomen and pelvis was performed following the standard protocol during bolus administration of intravenous contrast.  CONTRAST:  OMNIPAQUE IOHEXOL 300 MG/ML  SOLN  COMPARISON:  CT 07/02/2008  FINDINGS: CT CHEST FINDINGS  No axillary or supraclavicular lymphadenopathy. No mediastinal hilar lymphadenopathy. No pericardial fluid. Coronary calcifications are present. Esophagus is normal.  Review of the lung windows demonstrates linear atelectasis in the left and right lower lobes. No evidence of consolidation or pneumonia. There is mild centrilobular emphysema in the upper lobes.  CT ABDOMEN AND PELVIS FINDINGS  No focal hepatic lesion. Gallbladder contains a tiny gallstone. No evidence of gallbladder inflammation. Pancreas, spleen, adrenal glands, kidneys are normal. The stomach, small bowel, and cecum are normal. Colon and rectosigmoid colon are normal. Abdominal or is normal caliber.  There is no periportal lymphadenopathy. There is enlarged lymph node at the left common iliac nodal station measuring 18 mm. There is haziness to the fat surrounding this lymph node. No evidence of more deep pelvic lymphadenopathy. Small high periaortic lymph nodes which are not pathologic by size criteria but do have mild haziness surrounding. The largest measures 9 mm  between the IVC and aorta (image 88).  Prostate gland and bladder normal. Vascular calcifications noted. There is a stent of some sort in the right groin. No aggressive osseous lesion.  IMPRESSION:  1. No evidence infection or malignancy in the thorax. Bibasilar atelectasis noted.  2. Enlarged and mildly inflamed lymph node in the left common iliac location. Legrand Rams this to represent reactive adenopathy over malignancy but cannot exclude malignant lymph node including lymphoma. An outpatient FDG PET scan may be beneficial.  3. No evidence of source of infection in the abdomen or pelvis. There are additional smaller periaortic lymph nodes which are mildly inflamed.   Electronically Signed   By: Genevive Bi M.D.   On: 04/23/2013 16:20   Ct Abdomen Pelvis W Contrast  04/23/2013   CLINICAL DATA:  Fever of unknown origin, concern for malignancy.  EXAM: CT CHEST, ABDOMEN, AND PELVIS WITH CONTRAST  TECHNIQUE: Multidetector CT imaging of the chest, abdomen and pelvis was performed following the standard protocol during bolus administration of intravenous contrast.  CONTRAST:  OMNIPAQUE IOHEXOL 300 MG/ML  SOLN  COMPARISON:  CT 07/02/2008  FINDINGS: CT CHEST FINDINGS  No axillary or supraclavicular lymphadenopathy. No mediastinal hilar lymphadenopathy. No pericardial fluid. Coronary calcifications are present. Esophagus is normal.  Review of the lung windows demonstrates linear atelectasis in the left and right lower lobes. No evidence of consolidation or pneumonia. There is mild centrilobular emphysema in the upper lobes.  CT ABDOMEN AND PELVIS FINDINGS  No focal hepatic lesion. Gallbladder contains a tiny gallstone. No evidence of gallbladder inflammation. Pancreas, spleen, adrenal glands, kidneys are normal. The stomach, small bowel, and cecum are normal. Colon and rectosigmoid colon are normal. Abdominal or is normal caliber.  There is no periportal lymphadenopathy. There is enlarged lymph node at the left  common iliac nodal station measuring 18 mm. There is haziness to the fat surrounding this lymph node. No evidence of more deep pelvic lymphadenopathy. Small high periaortic lymph nodes which are not pathologic by size criteria but do have mild haziness surrounding. The largest measures 9 mm between the IVC and aorta (image 88).  Prostate gland and bladder normal. Vascular calcifications noted. There is a stent of some sort in  the right groin. No aggressive osseous lesion.  IMPRESSION:  1. No evidence infection or malignancy in the thorax. Bibasilar atelectasis noted.  2. Enlarged and mildly inflamed lymph node in the left common iliac location. Legrand Rams this to represent reactive adenopathy over malignancy but cannot exclude malignant lymph node including lymphoma. An outpatient FDG PET scan may be beneficial.  3. No evidence of source of infection in the abdomen or pelvis. There are additional smaller periaortic lymph nodes which are mildly inflamed.   Electronically Signed   By: Genevive Bi M.D.   On: 04/23/2013 16:20   Medications: I have reviewed the patient's current medications. Scheduled Meds: . clopidogrel  75 mg Oral Daily  . enoxaparin (LOVENOX) injection  40 mg Subcutaneous Q24H  . insulin aspart  0-9 Units Subcutaneous TID WC  . insulin aspart  5 Units Subcutaneous TID WC  . insulin glargine  15 Units Subcutaneous QHS  . lamiVUDine-zidovudine  1 tablet Oral BID  . pantoprazole  40 mg Oral Daily  . raltegravir  400 mg Oral BID  . sodium chloride  3 mL Intravenous Q12H  . tenofovir  300 mg Oral Daily   Continuous Infusions:   PRN Meds:. Assessment/Plan: Nathan [name] is a 62 y.o. male PMH DM2, PVD s/p right AKA, well controlled HIV (CD4 655, viral load <40), dyslipidemia, HTN, GERD, who presents as a direct admission from Infectious Disease clinic with a chief complaint of fever, malaise, and headache x2 weeks.   #Fever, malaise, myalgias, headache x2 weeks - Unknown origin. No  fevers overnight. It's possible his statin was the culprit as his fevers have resolved since this was discontinued about 2 days ago. Exam is nonfocal. He has no eosinophilia to suggest DRESS. Does not appear infectious (work up below). Blood smear is remarkable for polychromasia with teardrop cells per technician review, so there is some concern for myeloproliferative disorder. However the pathologist review of this specimen only noted neutropenia and macrocytic anemia. He has had recent weight loss: weight was 172lbs on 10/26, it is 160lbs here. Some suggestion of pancytopenia with WBC of 3.4 (baseline 6) and hemoglobin of ~9 (baseline 12-13). He will need outpatient FDG PET scan to work up inflamed lymph node seen on CT, which could possibly represent lymphoma. - Appreciate ID recs - CK > 434 (elevated) - Discontinue statin - Discontinue antibiotics - 2D echo > EF 60-65%, no vegetation mentioned, no significant valve regurgitation - Chest x-ray > no acute abnormalities except Minimal atelectasis left base - Followup influenza PCR > A and B negative - Tech smear review > Polychromasia with teardrop cells,  - Pathologist smear review > Neutropenia and macrocytic anemia - CT chest, abdomen, and pelvis with contrast > No evidence of frank infection or malignancy. Enlarged and mildly inflamed lymph node in the left common iliac location favored to represent reactive adenopathy over malignancy but cannot exclude malignant lymph node including lymphoma. An outpatient FDG PET scan may be beneficial. Additional smaller periaortic lymph nodes which are mildly inflamed. - Uric acid > 2.8 (low) - LDH > 289 (mildly elevated) - Serum protein electrophoresis > In process - ID pharmacist consult for medication review for drug fever > Per pharmacy, only potential culprit would be statin (which we discontinued 12/16) - Urinalysis > Clean - Blood cultures > NGTD - TSH > 1.42 (wnl) - Regular diet  - Daily BMP,  CBC - PT since deconditioned and with AKA > No follow up - Medically stable for discharge  today, will place referral for outpatient FDG PET scan  #Chronic macrocytic anemia - MCV 102.9. This is chronic per chart review. He takes Combivir, which contains zidovidine, a known cause of macrocytic anemia. Will rule out B12 deficiency since no results in the system and easily correctable. - Vitamin B12 > 347 (wnl)  #Hypotension - Resolving with IVF resuscitation. BP 103/65 today. - IVF NS @150cc /hr - Holding lisinopril 40 mg daily  #DM2 - CBG 95 on admission. Last A1c 7.5 on 12/05/2012.  - Lantus 15 units daily at bedtime  - Sensitive sliding scale insulin  - Checking hemoglobin A1c since he is due > 7.7  #HIV - Well controlled on Combivir 150-300 mg twice a day, raltegravir 400 mg twice a day, tenofovir 300 mg daily. CD4 655, viral load <40 on 03/04/13.  - Continue these medications   #HTN - Holding lisinopril 40 mg daily while hypotensive.  #HLD - Discontinued statin as above over concern for myopathy.  #GERD - Continue PPI.   #PVD - Continue Plavix 75 mg daily.   #DVT PPX - Subcutaneous Lovenox   Dispo: Disposition is deferred at this time, awaiting improvement of current medical problems.  Anticipated discharge in approximately 1-3 day(s).   The patient does have a current PCP Annett Gula, MD) and does need an [facility] hospital follow-up appointment after discharge.  The patient does not have transportation limitations that hinder transportation to clinic appointments.  .Services Needed at time of discharge: Y = Yes, Blank = No PT:   OT:   RN:   Equipment:   Other:     LOS: 4 days   Vivi Barrack, MD 04/25/2013, 8:50 AM

## 2013-04-28 ENCOUNTER — Telehealth: Payer: Self-pay | Admitting: Dietician

## 2013-04-28 LAB — PROTEIN ELECTROPHORESIS, SERUM
Alpha-2-Globulin: 14.7 % — ABNORMAL HIGH (ref 7.1–11.8)
Beta 2: 6.4 % (ref 3.2–6.5)
Gamma Globulin: 16.8 % (ref 11.1–18.8)
M-Spike, %: NOT DETECTED g/dL

## 2013-04-28 LAB — CULTURE, BLOOD (ROUTINE X 2): Culture: NO GROWTH

## 2013-04-28 NOTE — Telephone Encounter (Addendum)
Calling to assist with transition of care from hospital to home. Discharge date:04-25-13 Call date: 04-28-13, 04-29-13 Hospital follow up appointment date: 05-22-2013 at 2:45 Pm with Dr. Shirlee Latch Message left for patient to call if he was unable to fill any prescription or has questions or concerns and with hospital follow up day and time.

## 2013-04-29 LAB — CULTURE, BLOOD (SINGLE)

## 2013-04-29 NOTE — Telephone Encounter (Signed)
Unable to contact patient by phone

## 2013-04-29 NOTE — Discharge Summary (Signed)
I saw Nathan [name] on day of discharge and assisted in the discharge planning.

## 2013-05-05 ENCOUNTER — Telehealth: Payer: Self-pay | Admitting: Licensed Clinical Social Worker

## 2013-05-05 NOTE — Telephone Encounter (Signed)
Patient called wanting a refill for Hydrocodone before the end of the year, I explained that we did not have any providers in the office, but he wanted me to notify Dr. Daiva Eves if by chance he could write it.

## 2013-05-12 ENCOUNTER — Other Ambulatory Visit: Payer: Self-pay | Admitting: Licensed Clinical Social Worker

## 2013-05-12 DIAGNOSIS — E785 Hyperlipidemia, unspecified: Secondary | ICD-10-CM

## 2013-05-12 DIAGNOSIS — B20 Human immunodeficiency virus [HIV] disease: Secondary | ICD-10-CM

## 2013-05-12 DIAGNOSIS — Z113 Encounter for screening for infections with a predominantly sexual mode of transmission: Secondary | ICD-10-CM

## 2013-05-12 DIAGNOSIS — R52 Pain, unspecified: Secondary | ICD-10-CM

## 2013-05-12 MED ORDER — OXYCODONE HCL 10 MG PO TABS: 10.0000 mg | ORAL_TABLET | Freq: Two times a day (BID) | ORAL | Status: DC | PRN

## 2013-05-12 NOTE — Telephone Encounter (Signed)
Ok to fill now if not too soon

## 2013-05-22 ENCOUNTER — Encounter: Payer: Self-pay | Admitting: Internal Medicine

## 2013-05-22 ENCOUNTER — Ambulatory Visit (INDEPENDENT_AMBULATORY_CARE_PROVIDER_SITE_OTHER): Payer: Medicare Other | Admitting: Internal Medicine

## 2013-05-22 ENCOUNTER — Ambulatory Visit (HOSPITAL_COMMUNITY)
Admission: RE | Admit: 2013-05-22 | Discharge: 2013-05-22 | Disposition: A | Discharge status: Home / Self Care | Payer: Medicare Other | Source: Ambulatory Visit | Attending: Cardiology | Admitting: Cardiology

## 2013-05-22 VITALS — BP 120/64 | HR 85 | Temp 97.0°F | Ht 73.0 in | Wt 173.1 lb

## 2013-05-22 DIAGNOSIS — Z21 Asymptomatic human immunodeficiency virus [HIV] infection status: Secondary | ICD-10-CM

## 2013-05-22 DIAGNOSIS — E785 Hyperlipidemia, unspecified: Secondary | ICD-10-CM | POA: Diagnosis not present

## 2013-05-22 DIAGNOSIS — E1149 Type 2 diabetes mellitus with other diabetic neurological complication: Secondary | ICD-10-CM

## 2013-05-22 DIAGNOSIS — R600 Localized edema: Secondary | ICD-10-CM

## 2013-05-22 DIAGNOSIS — R609 Edema, unspecified: Secondary | ICD-10-CM

## 2013-05-22 DIAGNOSIS — I10 Essential (primary) hypertension: Secondary | ICD-10-CM | POA: Diagnosis not present

## 2013-05-22 DIAGNOSIS — M7989 Other specified soft tissue disorders: Secondary | ICD-10-CM

## 2013-05-22 DIAGNOSIS — R509 Fever, unspecified: Secondary | ICD-10-CM | POA: Diagnosis not present

## 2013-05-22 DIAGNOSIS — B20 Human immunodeficiency virus [HIV] disease: Secondary | ICD-10-CM | POA: Diagnosis not present

## 2013-05-22 DIAGNOSIS — IMO0002 Reserved for concepts with insufficient information to code with codable children: Secondary | ICD-10-CM

## 2013-05-22 LAB — BASIC METABOLIC PANEL WITH GFR
BUN: 6 mg/dL (ref 6–23)
CALCIUM: 9.1 mg/dL (ref 8.4–10.5)
CHLORIDE: 102 meq/L (ref 96–112)
CO2: 24 mEq/L (ref 19–32)
Creat: 0.87 mg/dL (ref 0.50–1.35)
GFR, Est African American: >89 mL/min
GFR, Est Non African American: >89 mL/min
GLUCOSE: 267 mg/dL — AB (ref 70–99)
Potassium: 4.1 mEq/L (ref 3.5–5.3)
Sodium: 135 mEq/L (ref 135–145)

## 2013-05-22 LAB — CBC WITH DIFFERENTIAL/PLATELET
Basophils Absolute: 0 10*3/uL (ref 0.0–0.1)
Basophils Relative: 0 % (ref 0–1)
EOS ABS: 0.2 10*3/uL (ref 0.0–0.7)
EOS PCT: 3 % (ref 0–5)
HEMATOCRIT: 31.4 % — AB (ref 39.0–52.0)
Hemoglobin: 10.7 g/dL — ABNORMAL LOW (ref 13.0–17.0)
LYMPHS ABS: 3.2 10*3/uL (ref 0.7–4.0)
LYMPHS PCT: 60 % — AB (ref 12–46)
MCH: 32.9 pg (ref 26.0–34.0)
MCHC: 34.1 g/dL (ref 30.0–36.0)
MCV: 96.6 fL (ref 78.0–100.0)
Monocytes Absolute: 0.4 10*3/uL (ref 0.1–1.0)
Monocytes Relative: 8 % (ref 3–12)
Neutro Abs: 1.5 10*3/uL — ABNORMAL LOW (ref 1.7–7.7)
Neutrophils Relative %: 29 % — ABNORMAL LOW (ref 43–77)
Platelets: 277 10*3/uL (ref 150–400)
RBC: 3.25 MIL/uL — AB (ref 4.22–5.81)
RDW: 14.3 % (ref 11.5–15.5)
WBC: 5.4 10*3/uL (ref 4.0–10.5)

## 2013-05-22 LAB — GLUCOSE, CAPILLARY: GLUCOSE-CAPILLARY: 312 mg/dL — AB (ref 70–99)

## 2013-05-22 MED ORDER — INSULIN GLARGINE 100 UNIT/ML ~~LOC~~ SOLN: 67.0000 [IU] | Freq: Every day | SUBCUTANEOUS | Status: DC

## 2013-05-22 NOTE — Progress Notes (Signed)
VASCULAR LAB PRELIMINARY  PRELIMINARY  PRELIMINARY  PRELIMINARY  Left lower extremity venous duplex completed.    Preliminary report:  Left:  No evidence of DVT, superficial thrombosis, or Baker's cyst.  Hubbert Landrigan, RVT 05/22/2013, 5:32 PM

## 2013-05-22 NOTE — Patient Instructions (Signed)
General Instructions: Increase your Lantus to 67 units at night. Keep taking Humalog 14, 24, and 24  See you in mid February  Get the Ultrasound of your leg  Treatment Goals:  Goals (1 Years of Data) as of 05/22/13         As of Today 04/25/13 04/25/13 04/24/13 04/24/13     Blood Pressure    . Blood Pressure < 140/90  120/64 136/83 103/65 129/68 131/68     Lifestyle    . Prevent Falls           Result Component    . HDL > 40          . HEMOGLOBIN A1C < 7.0          . LDL CALC < 100            Progress Toward Treatment Goals:  Treatment Goal 05/22/2013  Hemoglobin A1C unchanged  Blood pressure at goal  Prevent falls at goal    Self Care Goals & Plans:  Self Care Goal 05/22/2013  Manage my medications take my medicines as prescribed; bring my medications to every visit; refill my medications on time  Monitor my health keep track of my blood pressure; keep track of my blood glucose; bring my glucose meter and log to each visit; bring my blood pressure log to each visit; check my feet daily  Eat healthy foods drink diet soda or water instead of juice or soda; eat more vegetables; eat foods that are low in salt; eat baked foods instead of fried foods; eat fruit for snacks and desserts; eat smaller portions  Be physically active -  Prevent falls -  Meeting treatment goals maintain the current self-care plan    Home Blood Glucose Monitoring 05/22/2013  Check my blood sugar 3 times a day  When to check my blood sugar before meals     Care Management & Community Referrals:  Referral 05/22/2013  Referrals made for care management support none needed  Referrals made to community resources none     Type 2 Diabetes Mellitus, Adult Type 2 diabetes mellitus is a long-term (chronic) disease. In type 2 diabetes:  The pancreas does not make enough of a hormone called insulin.  The cells in the body do not respond as well to the insulin that is made.  Both of the above can  happen. Normally, insulin moves sugars from food into tissue cells. This gives you energy. If you have type 2 diabetes, sugars cannot be moved into tissue cells. This causes high blood sugar (hyperglycemia).  HOME CARE  Have your hemoglobin A1c level checked twice a year. The level shows if your diabetes is under control or out of control.  Perform daily blood sugar testing as told by your doctor.  Check your ketone levels by testing your pee (urine) when you are sick and as told.  Take your diabetes or insulin medicine as told by your doctor.  Never run out of insulin.  Adjust how much insulin you give yourself based on how many carbs (carbohydrates) you eat. Carbs are in many foods, such as fruits, vegetables, whole grains, and dairy products.  Have a healthy snack between every healthy meal. Have 3 meals and 3 snacks a day.  Lose weight if you are overweight.  Carry a medical alert card or wear your medical alert jewelry.  Carry a 15 gram carb snack with you at all times. Examples include:  Glucose pills, 3 or 4.  Glucose gel, 15 gram tube.  Raisins, 2 tablespoons (24 grams).  Jelly beans, 6.  Animal crackers, 8.  Sugar pop, 4 ounces (120 milliliters).  Gummy treats, 9.  Notice low blood sugar (hypoglycemia) symptoms, such as:  Shaking (tremors).  Decreased ability to think clearly.  Sweating.  Increased heart rate.  Headache.  Dry mouth.  Hunger.  Crabbiness (irritability).  Being worried or tense (anxiety).  Restless sleep.  A change in speech or coordination.  Confusion.  Treat low blood sugar right away. If you are alert and can swallow, follow the 15:15 rule:  Take 15 20 grams of a rapid-acting glucose or carb. This includes glucose gel, glucose pills, or 4 ounces (120 milliliters) of fruit juice, regular pop, or low-fat milk.  Check your blood sugar level after taking the glucose.  Take 15 20 grams of more glucose if the repeat blood  sugar level is still 70 mg/dL (milligrams/deciliter) or below.  Eat a meal or snack within 1 hour of the blood sugar levels going back to normal.  Notice early symptoms of high blood sugar, such as:  Being really thirsty or drinking a lot (polydipsia).  Peeing (urinating) a lot (polyuria).  Do at least 150 minutes of physical activity a week or as told.  Split the 150 minutes of activity up during the week. Do not do 150 minutes of activity in one day.  Perform exercises, such as weight lifting, at least 2 times a week or as told.  Adjust your insulin or food intake as needed if you start a new exercise or sport.  Follow your sick day plan when you are not able to eat or drink as usual.  Avoid tobacco use.  Women who are not pregnant should drink no more than 1 drink a day. Men should drink no more than 2 drinks a day.  Only drink alcohol with food.  Ask your doctor if alcohol is safe for you.  Tell your doctor if you drink alcohol several times during the week.  See your doctor regularly.  Schedule an eye exam soon after you are diagnosed with diabetes. Schedule exams once every year.  Check your skin and feet every day. Check for cuts, bruises, redness, nail problems, bleeding, blisters, or sores. A doctor should do a foot exam once a year.  Brush your teeth and gums twice a day. Floss once a day. Visit your dentist regularly.  Share your diabetes plan with your workplace or school.  Stay up-to-date with shots that fight against diseases (immunizations).  Learn how to manage stress.  Get diabetes education and support as needed.  Ask your doctor for special help if:  You need help to maintain or improve how you to do things on your own.  You need help to maintain or improve the quality of your life.  You have foot or hand problems.  You have trouble cleaning yourself, dressing, eating, or doing physical activity. GET HELP RIGHT AWAY IF:  You have trouble  breathing.  You have moderate to large ketone levels.  You are unable to eat food or drink fluids for more than 6 hours.  You feel sick to your stomach (nauseous) or throw up (vomit) for more than 6 hours.  Your blood sugar level is over 240 mg/dL.  There is a change in mental status.  You get another serious illness.  You have watery poop (diarrhea) for more than 6 hours.  You have been sick or have had a  fever for 2 or more days and are not getting better.  You have pain when you are physically active. MAKE SURE YOU:  Understand these instructions.  Will watch your condition.  Will get help right away if you are not doing well or get worse. Document Released: 02/01/2008 Document Revised: 02/12/2013 Document Reviewed: 08/23/2012 [facility] Patient Information 2014 [place], [place].

## 2013-05-23 ENCOUNTER — Encounter: Payer: Self-pay | Admitting: Internal Medicine

## 2013-05-23 DIAGNOSIS — R600 Localized edema: Secondary | ICD-10-CM | POA: Insufficient documentation

## 2013-05-23 NOTE — Assessment & Plan Note (Signed)
Lab Results  Component Value Date   HGBA1C 7.7* 04/21/2013   HGBA1C 7.5 12/05/2012   HGBA1C 7.8 07/25/2012     Assessment: Diabetes control: fair control Progress toward A1C goal:  unchanged Comments: still with hyperglycemic episodes   Plan: Medications:  continue current medications (increased Lantus 65 u to 67 u qhs.  Will continue with Humalog 14, 24,24) Home glucose monitoring: Frequency: 3 times a day Timing: before meals Instruction/counseling given: reminded to bring blood glucose meter & log to each visit, reminded to bring medications to each visit, discussed diet and provided printed educational material Educational resources provided: brochure;handout Self management tools provided: copy of home glucose meter download Other plans: f/u mid 06/2013

## 2013-05-23 NOTE — Assessment & Plan Note (Addendum)
1+ left leg edema with Varicosities  Concerned for DVT US leg negative  W/o h/o PVD and stenting will ask pt to f/u with [facility] Avoid TED hose due to h/o PVD and stenting Elevate leg for relief of sxs

## 2013-05-23 NOTE — Assessment & Plan Note (Signed)
BP Readings from Last 3 Encounters:  05/22/13 120/64  04/25/13 136/83  04/21/13 100/64    Lab Results  Component Value Date   NA 135 05/22/2013   K 4.1 05/22/2013   CREATININE 0.87 05/22/2013    Assessment: Blood pressure control: controlled Progress toward BP goal:  at goal Comments: none  Plan: Medications:  continue current medications Educational resources provided: brochure;handout Self management tools provided: home blood pressure logbook Other plans: f/u in 2/15

## 2013-05-23 NOTE — Progress Notes (Signed)
   Subjective:    Patient ID: Nathan [name], male    DOB: Sep 09, 1950, 63 y.o.   MRN: [id number]  HPI Comments: 63 y.o PMH DM 2 associated with PVD (HA1C 7.7 04/2013 cbg 312), controlled HIV (cd4 655, viral load wnl), dyslipidemia (LDL 73 just stopped Lipitor 04/2013 b/c thought etiology of fever), HTN (BP 120/64), h/o right AKA, h/o colon polyps, GERD, h/o Hep B core Ab +, h/o MRSA, h/o MS, h/o PVD.    He presents for hospital f/u for FUO.  He denies fever.  He is no longer weak.  His previous SPEP did not have an M spike.  Pending PET scan  He states his left ankle/leg and foot have been swollen since 3 days from discharge from the hospital and he has had some pain and tingling but has baseline tingling.  Nothing makes better. Activity makes leg swelling worse.      He has DM takes Lantus 65 u qhs, 14, 24 and 24 units prandial. Meter readings range from mid 100s to 593.  Avg cbg 299.  With 593 reading pt states he was eating some sugar free jelly beans     Review of Systems  Respiratory: Negative for shortness of breath.   Cardiovascular: Positive for leg swelling. Negative for chest pain.       Objective:   Physical Exam  Nursing note and vitals reviewed. Constitutional: He is oriented to person, place, and time. Vital signs are normal. He appears well-developed and well-nourished. He is cooperative.  HENT:  Head: Normocephalic and atraumatic.  Mouth/Throat: Oropharynx is clear and moist and mucous membranes are normal. No oropharyngeal exudate.  Eyes: Conjunctivae are normal. Right eye exhibits no discharge. Left eye exhibits no discharge. No scleral icterus.  Cardiovascular: Normal rate, regular rhythm, S1 normal, S2 normal and normal heart sounds.   No murmur heard. Left leg with 1+ edema   Pulmonary/Chest: Effort normal and breath sounds normal.  Musculoskeletal: He exhibits no edema.  Neurological: He is alert and oriented to person, place, and time. Gait normal.  Skin: Skin  is warm, dry and intact. No rash noted.  Varicosities to LLE  Psychiatric: He has a normal mood and affect. His speech is normal and behavior is normal. Judgment and thought content normal. Cognition and memory are normal.          Assessment & Plan:  F/u mid 06/2013

## 2013-05-23 NOTE — Assessment & Plan Note (Signed)
Fever has resolved.  D/C Lipitor b/c thought due to med related   CT 12/17 showed Enlarged and mildly inflamed lymph node in the left common iliac location. Burtis Junes this to represent reactive adenopathy over malignancy but cannot exclude malignant lymph node including lymphoma. An outpatient FDG PET scan may be beneficial.   Will f/u with PET scanned ordered from hospital d/c.  Try to get PET scan to r/o malignancy

## 2013-05-23 NOTE — Assessment & Plan Note (Signed)
Controlled follows with ID  

## 2013-05-23 NOTE — Assessment & Plan Note (Signed)
Lipid Panel     Component Value Date/Time   CHOL 138 03/04/2013 0927   TRIG 177* 03/04/2013 0927   HDL 30* 03/04/2013 0927   CHOLHDL 4.6 03/04/2013 0927   VLDL 35 03/04/2013 0927   LDLCALC 73 03/04/2013 0927   Recently stopped Lipitor d/t thought was etiology of fever.   Will reassess lipids in the future

## 2013-05-24 NOTE — Progress Notes (Signed)
Case discussed with Dr. McLean soon after the resident saw the patient.  We reviewed the resident's history and exam and pertinent patient test results.  I agree with the assessment, diagnosis, and plan of care documented in the resident's note. 

## 2013-05-28 ENCOUNTER — Telehealth: Payer: Self-pay | Admitting: *Deleted

## 2013-05-28 NOTE — Telephone Encounter (Signed)
Call to pt to schedule PET Scan.  Appointment scheduled for 06/06/2013 at 8:00 AM pt to arrive by 7:45 AM in Radiology.  Pt given instructions to hold am dose of Insulin and to be NPO after 12 midnight.  No gum, candy or coffee pt voiced understanding of all instructions.  Sander Nephew, RN 05/28/2013 8:51 AM.

## 2013-05-29 ENCOUNTER — Ambulatory Visit (INDEPENDENT_AMBULATORY_CARE_PROVIDER_SITE_OTHER): Payer: Self-pay | Admitting: *Deleted

## 2013-05-29 ENCOUNTER — Other Ambulatory Visit: Payer: Self-pay | Admitting: Infectious Disease

## 2013-05-29 VITALS — BP 149/78 | HR 70 | Temp 97.6°F | Resp 16 | Wt 171.5 lb

## 2013-05-29 DIAGNOSIS — Z21 Asymptomatic human immunodeficiency virus [HIV] infection status: Secondary | ICD-10-CM

## 2013-05-29 DIAGNOSIS — E785 Hyperlipidemia, unspecified: Secondary | ICD-10-CM

## 2013-05-29 DIAGNOSIS — B20 Human immunodeficiency virus [HIV] disease: Secondary | ICD-10-CM

## 2013-05-29 LAB — COMPREHENSIVE METABOLIC PANEL WITH GFR
ALT: 43 U/L (ref 0–53)
AST: 37 U/L (ref 0–37)
Albumin: 4.8 g/dL (ref 3.5–5.2)
Alkaline Phosphatase: 80 U/L (ref 39–117)
BUN: 8 mg/dL (ref 6–23)
CO2: 25 meq/L (ref 19–32)
Calcium: 9.5 mg/dL (ref 8.4–10.5)
Chloride: 100 meq/L (ref 96–112)
Creat: 0.83 mg/dL (ref 0.50–1.35)
Glucose, Bld: 240 mg/dL — ABNORMAL HIGH (ref 70–99)
Potassium: 4.6 meq/L (ref 3.5–5.3)
Sodium: 136 meq/L (ref 135–145)
Total Bilirubin: 0.5 mg/dL (ref 0.3–1.2)
Total Protein: 8 g/dL (ref 6.0–8.3)

## 2013-05-29 LAB — CK: Total CK: 179 U/L (ref 7–232)

## 2013-05-29 NOTE — Addendum Note (Signed)
Addended by: Araceli Bouche on: 05/29/2013 12:33 PM   Modules accepted: Orders

## 2013-05-29 NOTE — Progress Notes (Signed)
Nathan [name] is here for his final visit on A5314 study. Since last study visit he was admitted for fever of unknown etiology on 04/21/2013. During admission his atorvastatin was discontinued over concern for drug fever and myopathy; CT revealed an inflamed lymph node located in the left iliac crest questionable for lymphoma which warrants further work up (PET scan scheduled for 06/06/13). He is positive for +1 pedal edema in his right foot. Nathan [name] states that this swelling is slowly getting better. All other symptoms have resolved.  Nathan [name] is concerned with the recent stop to his Lipitor that he has taken for 8-10 yrs. Given his hx of PVD, DM, Rt. AKA, etc I have discussed this issue with Dr. Tommy Medal.  Fasting labs were drawn and BP mildly elevated. He takes his BP medication at night. Questionnaire completed. He received $50 gift card for visit. Eliezer Champagne RN

## 2013-05-30 ENCOUNTER — Telehealth: Payer: Self-pay | Admitting: *Deleted

## 2013-05-30 DIAGNOSIS — E7800 Pure hypercholesterolemia, unspecified: Secondary | ICD-10-CM

## 2013-05-30 MED ORDER — ATORVASTATIN CALCIUM 40 MG PO TABS: 40.0000 mg | ORAL_TABLET | Freq: Every day | ORAL | Status: DC

## 2013-05-30 NOTE — Telephone Encounter (Signed)
CPK recheck on 05/29/2013 and results = 179. Symptoms from hospital stay have resolved. Reviewed results with Dr. Tommy Medal and orders given to restart atorvastatin 40mg  1 tablet by mouth daily. Called in prescription to pharmacy. Left message for Yosgar to restart this medication and watch out for any symptoms (body aches, muscle aches, fevers). Eliezer Champagne RN

## 2013-06-06 ENCOUNTER — Ambulatory Visit (HOSPITAL_COMMUNITY)
Admission: RE | Admit: 2013-06-06 | Discharge: 2013-06-06 | Disposition: A | Discharge status: Home / Self Care | Payer: Medicare Other | Source: Ambulatory Visit | Attending: Internal Medicine | Admitting: Internal Medicine

## 2013-06-06 ENCOUNTER — Encounter (HOSPITAL_COMMUNITY): Payer: Self-pay

## 2013-06-06 DIAGNOSIS — R590 Localized enlarged lymph nodes: Secondary | ICD-10-CM

## 2013-06-06 DIAGNOSIS — K8020 Calculus of gallbladder without cholecystitis without obstruction: Secondary | ICD-10-CM | POA: Diagnosis not present

## 2013-06-06 DIAGNOSIS — R599 Enlarged lymph nodes, unspecified: Secondary | ICD-10-CM | POA: Insufficient documentation

## 2013-06-06 DIAGNOSIS — I7090 Unspecified atherosclerosis: Secondary | ICD-10-CM | POA: Insufficient documentation

## 2013-06-06 DIAGNOSIS — J438 Other emphysema: Secondary | ICD-10-CM | POA: Insufficient documentation

## 2013-06-06 LAB — GLUCOSE, CAPILLARY: Glucose-Capillary: 215 mg/dL — ABNORMAL HIGH (ref 70–99)

## 2013-06-06 MED ORDER — FLUDEOXYGLUCOSE F - 18 (FDG) INJECTION
17.6000 | Freq: Once | INTRAVENOUS | Status: AC | PRN
  Administered 2013-06-06: 17.6 via INTRAVENOUS

## 2013-06-12 ENCOUNTER — Encounter: Payer: Self-pay | Admitting: Infectious Disease

## 2013-06-12 LAB — CD4/CD8 (T-HELPER/T-SUPPRESSOR CELL)
CD4%: 23.4
CD4: 632
CD8 % Suppressor T Cell: 62
CD8: 1674

## 2013-06-12 LAB — HIV-1 RNA QUANT-NO REFLEX-BLD

## 2013-07-03 ENCOUNTER — Encounter: Payer: Self-pay | Admitting: Internal Medicine

## 2013-07-08 ENCOUNTER — Other Ambulatory Visit: Payer: Self-pay | Admitting: Licensed Clinical Social Worker

## 2013-07-08 ENCOUNTER — Telehealth: Payer: Self-pay | Admitting: Licensed Clinical Social Worker

## 2013-07-08 DIAGNOSIS — R52 Pain, unspecified: Secondary | ICD-10-CM

## 2013-07-08 DIAGNOSIS — B20 Human immunodeficiency virus [HIV] disease: Secondary | ICD-10-CM

## 2013-07-08 DIAGNOSIS — Z113 Encounter for screening for infections with a predominantly sexual mode of transmission: Secondary | ICD-10-CM

## 2013-07-08 DIAGNOSIS — E785 Hyperlipidemia, unspecified: Secondary | ICD-10-CM

## 2013-07-08 MED ORDER — OXYCODONE HCL 10 MG PO TABS: 10.0000 mg | ORAL_TABLET | Freq: Two times a day (BID) | ORAL | Status: DC | PRN

## 2013-07-08 NOTE — Telephone Encounter (Signed)
Patient would like a prescription for Glucerna to fax to [facility], he hasn't had this in a while. I explained to him that he would need to be weighed to see what his BMI is. He is coming to pick up his narcotic script tomorrow morning, I asked him to ask the nurse to get his weight. I will put him on the nurse schedule for a weight check.

## 2013-07-09 ENCOUNTER — Encounter: Payer: Self-pay | Admitting: *Deleted

## 2013-07-09 NOTE — Progress Notes (Signed)
Patient ID: Nathan [name], male   DOB: 05-23-1950, 63 y.o.   MRN: [id number] Pt stated that he would discuss his request for Glucerna and concerns about his fluctuating CBGs when he comes for his appointment 08/04/13 w/ Dr. Tommy Medal.

## 2013-07-10 ENCOUNTER — Encounter: Payer: Self-pay | Admitting: Internal Medicine

## 2013-07-10 ENCOUNTER — Ambulatory Visit (INDEPENDENT_AMBULATORY_CARE_PROVIDER_SITE_OTHER): Payer: Medicare Other | Admitting: Internal Medicine

## 2013-07-10 VITALS — BP 136/76 | HR 70 | Temp 96.6°F | Ht 73.0 in | Wt 173.5 lb

## 2013-07-10 DIAGNOSIS — I798 Other disorders of arteries, arterioles and capillaries in diseases classified elsewhere: Secondary | ICD-10-CM | POA: Diagnosis not present

## 2013-07-10 DIAGNOSIS — G547 Phantom limb syndrome without pain: Secondary | ICD-10-CM | POA: Diagnosis not present

## 2013-07-10 DIAGNOSIS — R600 Localized edema: Secondary | ICD-10-CM

## 2013-07-10 DIAGNOSIS — R509 Fever, unspecified: Secondary | ICD-10-CM | POA: Diagnosis not present

## 2013-07-10 DIAGNOSIS — Z0000 Encounter for general adult medical examination without abnormal findings: Secondary | ICD-10-CM | POA: Diagnosis not present

## 2013-07-10 DIAGNOSIS — E1159 Type 2 diabetes mellitus with other circulatory complications: Secondary | ICD-10-CM

## 2013-07-10 DIAGNOSIS — G35 Multiple sclerosis: Secondary | ICD-10-CM

## 2013-07-10 DIAGNOSIS — R51 Headache: Secondary | ICD-10-CM | POA: Diagnosis not present

## 2013-07-10 DIAGNOSIS — B20 Human immunodeficiency virus [HIV] disease: Secondary | ICD-10-CM | POA: Diagnosis not present

## 2013-07-10 DIAGNOSIS — E1149 Type 2 diabetes mellitus with other diabetic neurological complication: Secondary | ICD-10-CM | POA: Diagnosis not present

## 2013-07-10 DIAGNOSIS — I10 Essential (primary) hypertension: Secondary | ICD-10-CM | POA: Diagnosis not present

## 2013-07-10 DIAGNOSIS — IMO0002 Reserved for concepts with insufficient information to code with codable children: Secondary | ICD-10-CM

## 2013-07-10 DIAGNOSIS — I739 Peripheral vascular disease, unspecified: Secondary | ICD-10-CM

## 2013-07-10 DIAGNOSIS — R109 Unspecified abdominal pain: Secondary | ICD-10-CM | POA: Diagnosis not present

## 2013-07-10 DIAGNOSIS — R55 Syncope and collapse: Secondary | ICD-10-CM | POA: Diagnosis not present

## 2013-07-10 DIAGNOSIS — R609 Edema, unspecified: Secondary | ICD-10-CM

## 2013-07-10 DIAGNOSIS — R110 Nausea: Secondary | ICD-10-CM | POA: Diagnosis not present

## 2013-07-10 DIAGNOSIS — R5381 Other malaise: Secondary | ICD-10-CM | POA: Diagnosis not present

## 2013-07-10 DIAGNOSIS — E785 Hyperlipidemia, unspecified: Secondary | ICD-10-CM

## 2013-07-10 DIAGNOSIS — R5383 Other fatigue: Secondary | ICD-10-CM | POA: Diagnosis not present

## 2013-07-10 DIAGNOSIS — IMO0001 Reserved for inherently not codable concepts without codable children: Secondary | ICD-10-CM | POA: Diagnosis not present

## 2013-07-10 LAB — HM DIABETES EYE EXAM

## 2013-07-10 LAB — POCT GLYCOSYLATED HEMOGLOBIN (HGB A1C): HEMOGLOBIN A1C: 8.7

## 2013-07-10 LAB — GLUCOSE, CAPILLARY: Glucose-Capillary: 178 mg/dL — ABNORMAL HIGH (ref 70–99)

## 2013-07-10 MED ORDER — GLUCERNA PO LIQD: 237.0000 mL | Freq: Two times a day (BID) | ORAL | Status: DC

## 2013-07-10 MED ORDER — LIRAGLUTIDE 18 MG/3ML ~~LOC~~ SOPN: 0.6000 mg | PEN_INJECTOR | Freq: Every day | SUBCUTANEOUS | Status: DC

## 2013-07-10 NOTE — Assessment & Plan Note (Signed)
Resolved  Reviewed PET scan unremarkable

## 2013-07-10 NOTE — Assessment & Plan Note (Addendum)
Will check HA1C today, due for eye exam (reminded pt) will get retinal exam today

## 2013-07-10 NOTE — Assessment & Plan Note (Signed)
Lipid Panel     Component Value Date/Time   CHOL 138 03/04/2013 0927   TRIG 177* 03/04/2013 0927   HDL 30* 03/04/2013 0927   CHOLHDL 4.6 03/04/2013 0927   VLDL 35 03/04/2013 0927   LDLCALC 73 03/04/2013 0927   Continue statin

## 2013-07-10 NOTE — Assessment & Plan Note (Signed)
BP Readings from Last 3 Encounters:  07/10/13 136/76  05/29/13 149/78  05/22/13 120/64    Lab Results  Component Value Date   NA 136 05/29/2013   K 4.6 05/29/2013   CREATININE 0.83 05/29/2013    Assessment: Blood pressure control: controlled Progress toward BP goal:  at goal Comments: n/a   Plan: Medications:  continue current medications Educational resources provided: other (see comments) Self management tools provided: other (see comments) Other plans: f/u in 3 months

## 2013-07-10 NOTE — Assessment & Plan Note (Signed)
Gets Oxycodone from Dr. Tommy Medal

## 2013-07-10 NOTE — Progress Notes (Signed)
   Subjective:    Patient ID: Nathan [name], male    DOB: [birth year], 63 y.o.   MRN: [id number]  HPI Comments: 63 y.o PMH DM 2 associated with PVD (HA1C 7.7>8.7), controlled HIV (cd4 632, viral load wnl 05/2013), dyslipidemia (LDL 73 02/2013), HTN (BP 136/76), h/o right AKA, h/o colon polyps, GERD, h/o Hep B core Ab +, h/o MRSA, h/o MS, h/o PVD, emphysema per last PET scan results.    1) He presents for f/u DM.  Lantus 67 u qhs, 14, 24 and 24 units prandial. Still having hyperglycemia with cbgs in the am 240s-270s.  No hypoglycemic episodes and high values to 300s-400s.   He states he misses some prandial doses but overall compliant  2) As far as f/u for FUO.  PET scan was unremarkable and reviewed.   3) left ankle/leg and foot swelling-05/2013 duplex was negative for DVT. Swelling is resolved  4) He wants Rx refill for glucerna-filled today           Review of Systems  Constitutional: Negative for fever and unexpected weight change.  Respiratory: Negative for shortness of breath.        Denies sob with exertion, PND  Cardiovascular: Negative for chest pain and leg swelling.  Gastrointestinal: Negative for constipation.       Objective:   Physical Exam  Nursing note and vitals reviewed. Constitutional: He is oriented to person, place, and time. Vital signs are normal. He appears well-developed and well-nourished. He is cooperative.  HENT:  Head: Normocephalic and atraumatic.  Mouth/Throat: Oropharynx is clear and moist and mucous membranes are normal. He has dentures. No oropharyngeal exudate.  Eyes: Conjunctivae are normal. Pupils are equal, round, and reactive to light. Right eye exhibits no discharge. Left eye exhibits no discharge. No scleral icterus.  Cardiovascular: Normal rate, regular rhythm, S1 normal, S2 normal and normal heart sounds.   No murmur heard. No left lower ext edema  Pulmonary/Chest: Effort normal and breath sounds normal.  Abdominal: Soft. Bowel sounds are  normal. He exhibits no distension. There is no tenderness.  Musculoskeletal: He exhibits no edema.  Neurological: He is alert and oriented to person, place, and time.  Walks with limp d/t right AKA and prosthetic   Skin: Skin is warm, dry and intact. No rash noted.  Psychiatric: He has a normal mood and affect. His speech is normal and behavior is normal. Judgment and thought content normal. Cognition and memory are normal.          Assessment & Plan:  F/u in 3 months sooner if needed

## 2013-07-10 NOTE — Assessment & Plan Note (Signed)
Follows with [facility] outpatient.  S/p R AKA

## 2013-07-10 NOTE — Assessment & Plan Note (Addendum)
[place] 05/2013 negative for DVT, edema improved  And resolved

## 2013-07-10 NOTE — Assessment & Plan Note (Addendum)
Controlled follows with [facility] 3/18 and f/u 3/30

## 2013-07-10 NOTE — Patient Instructions (Addendum)
General Instructions: Please review all the information below  We will let you know the results of your eye exam today  Please start Victoza 0.6 mg subcutaneous daily-read info below  Continue your other medications for diabetes  I will call you in 1 week to see how your sugars are doing please log then  Follow up in 3 months    Treatment Goals:  Goals (1 Years of Data) as of 07/10/13         As of Today 05/29/13 05/22/13 04/25/13 04/25/13     Blood Pressure    . Blood Pressure < 140/90  136/76 149/78 120/64 136/83 103/65     Lifestyle    . Prevent Falls           Result Component    . HDL > 40          . HEMOGLOBIN A1C < 7.0  8.7        . LDL CALC < 100          . LDL CALC < 100           Lipid Panel     Component Value Date/Time   CHOL 138 03/04/2013 0927   TRIG 177* 03/04/2013 0927   HDL 30* 03/04/2013 0927   CHOLHDL 4.6 03/04/2013 0927   VLDL 35 03/04/2013 0927   LDLCALC 73 03/04/2013 0927    Progress Toward Treatment Goals:  Treatment Goal 07/10/2013  Hemoglobin A1C deteriorated  Blood pressure at goal  Prevent falls at goal    Self Care Goals & Plans:  Self Care Goal 07/10/2013  Manage my medications take my medicines as prescribed; bring my medications to every visit; refill my medications on time; follow the sick day instructions if I am sick  Monitor my health keep track of my blood glucose; bring my glucose meter and log to each visit; keep track of my blood pressure; keep track of my weight; check my feet daily  Eat healthy foods drink diet soda or water instead of juice or soda; eat more vegetables; eat foods that are low in salt; eat baked foods instead of fried foods; eat fruit for snacks and desserts; eat smaller portions  Be physically active find an activity I enjoy  Prevent falls -  Meeting treatment goals maintain the current self-care plan    Home Blood Glucose Monitoring 07/10/2013  Check my blood sugar 3 times a day  When to check my blood sugar  before meals     Care Management & Community Referrals:  Referral 07/10/2013  Referrals made for care management support none needed  Referrals made to community resources none    Hyperglycemia Hyperglycemia occurs when the glucose (sugar) in your blood is too high. Hyperglycemia can happen for many reasons, but it most often happens to people who do not know they have diabetes or are not managing their diabetes properly.  CAUSES  Whether you have diabetes or not, there are other causes of hyperglycemia. Hyperglycemia can occur when you have diabetes, but it can also occur in other situations that you might not be as aware of, such as: Diabetes  If you have diabetes and are having problems controlling your blood glucose, hyperglycemia could occur because of some of the following reasons:  Not following your meal plan.  Not taking your diabetes medications or not taking it properly.  Exercising less or doing less activity than you normally do.  Being sick. Pre-diabetes  This cannot be ignored.  Before people develop Type 2 diabetes, they almost always have "pre-diabetes." This is when your blood glucose levels are higher than normal, but not yet high enough to be diagnosed as diabetes. Research has shown that some long-term damage to the body, especially the heart and circulatory system, may already be occurring during pre-diabetes. If you take action to manage your blood glucose when you have pre-diabetes, you may delay or prevent Type 2 diabetes from developing. Stress  If you have diabetes, you may be "diet" controlled or on oral medications or insulin to control your diabetes. However, you may find that your blood glucose is higher than usual in the hospital whether you have diabetes or not. This is often referred to as "stress hyperglycemia." Stress can elevate your blood glucose. This happens because of hormones put out by the body during times of stress. If stress has been the cause  of your high blood glucose, it can be followed regularly by your caregiver. That way he/she can make sure your hyperglycemia does not continue to get worse or progress to diabetes. Steroids  Steroids are medications that act on the infection fighting system (immune system) to block inflammation or infection. One side effect can be a rise in blood glucose. Most people can produce enough extra insulin to allow for this rise, but for those who cannot, steroids make blood glucose levels go even higher. It is not unusual for steroid treatments to "uncover" diabetes that is developing. It is not always possible to determine if the hyperglycemia will go away after the steroids are stopped. A special blood test called an A1c is sometimes done to determine if your blood glucose was elevated before the steroids were started. SYMPTOMS  Thirsty.  Frequent urination.  Dry mouth.  Blurred vision.  Tired or fatigue.  Weakness.  Sleepy.  Tingling in feet or leg. DIAGNOSIS  Diagnosis is made by monitoring blood glucose in one or all of the following ways:  A1c test. This is a chemical found in your blood.  Fingerstick blood glucose monitoring.  Laboratory results. TREATMENT  First, knowing the cause of the hyperglycemia is important before the hyperglycemia can be treated. Treatment may include, but is not be limited to:  Education.  Change or adjustment in medications.  Change or adjustment in meal plan.  Treatment for an illness, infection, etc.  More frequent blood glucose monitoring.  Change in exercise plan.  Decreasing or stopping steroids.  Lifestyle changes. HOME CARE INSTRUCTIONS   Test your blood glucose as directed.  Exercise regularly. Your caregiver will give you instructions about exercise. Pre-diabetes or diabetes which comes on with stress is helped by exercising.  Eat wholesome, balanced meals. Eat often and at regular, fixed times. Your caregiver or nutritionist  will give you a meal plan to guide your sugar intake.  Being at an ideal weight is important. If needed, losing as little as 10 to 15 pounds may help improve blood glucose levels. SEEK MEDICAL CARE IF:   You have questions about medicine, activity, or diet.  You continue to have symptoms (problems such as increased thirst, urination, or weight gain). SEEK IMMEDIATE MEDICAL CARE IF:   You are vomiting or have diarrhea.  Your breath smells fruity.  You are breathing faster or slower.  You are very sleepy or incoherent.  You have numbness, tingling, or pain in your feet or hands.  You have chest pain.  Your symptoms get worse even though you have been following your caregiver's orders.  If you have any other questions or concerns. Document Released: 10/18/2000 Document Revised: 07/17/2011 Document Reviewed: 08/21/2011 [facility] Patient Information 2014 [place], [place].    Fall Prevention and Home Safety Falls cause injuries and can affect all age groups. It is possible to prevent falls.  HOW TO PREVENT FALLS  Wear shoes with rubber soles that do not have an opening for your toes.  Keep the inside and outside of your house well lit.  Use night lights throughout your home.  Remove clutter from floors.  Clean up floor spills.  Remove throw rugs or fasten them to the floor with carpet tape.  Do not place electrical cords across pathways.  Put grab bars by your tub, shower, and toilet. Do not use towel bars as grab bars.  Put handrails on both sides of the stairway. Fix loose handrails.  Do not climb on stools or stepladders, if possible.  Do not wax your floors.  Repair uneven or unsafe sidewalks, walkways, or stairs.  Keep items you use a lot within reach.  Be aware of pets.  Keep emergency numbers next to the telephone.  Put smoke detectors in your home and near bedrooms. Ask your doctor what other things you can do to prevent falls. Document Released:  02/18/2009 Document Revised: 10/24/2011 Document Reviewed: 07/25/2011 [facility] Patient Information 2014 [place], [place].  Diabetes, Eating Away From Home Sometimes, you might eat in a restaurant or have meals that are prepared by someone else. You can enjoy eating out. However, the portions in restaurants may be much larger than needed. Listed below are some ideas to help you choose foods that will keep your blood glucose (sugar) in better control.  TIPS FOR EATING OUT  Know your meal plan and how many carbohydrate servings you should have at each meal. You may wish to carry a copy of your meal plan in your purse or wallet. Learn the foods included in each food group.  Make a list of restaurants near you that offer healthy choices. Take a copy of the carry-out menus to see what they offer. Then, you can plan what you will order ahead of time.  Become familiar with serving sizes by practicing them at home using measuring cups and spoons. Once you learn to recognize portion sizes, you will be able to correctly estimate the amount of total carbohydrate you are allowed to eat at the restaurant. Ask for a takeout box if the portion is more than you should have. When your food comes, leave the amount you should have on the plate, and put the rest in the takeout box before you start eating.  Plan ahead if your mealtime will be different from usual. Check with your caregiver to find out how to time meals and medicine if you are taking insulin.  Avoid high-fat foods, such as fried foods, cream sauces, high-fat salad dressings, or any added butter or margarine.  Do not be afraid to ask questions. Ask your server about the portion size, cooking methods, ingredients and if items can be substituted. Restaurants do not list all available items on the menu. You can ask for your main entree to be prepared using skim milk, oil instead of butter or margarine, and without gravy or sauces. Ask your waiter or waitress  to serve salad dressings, gravy, sauces, margarine, and sour cream on the side. You can then add the amount your meal plan suggests.  Add more vegetables whenever possible.  Avoid items that are labeled "jumbo," "giant," "deluxe,"  or "supersized."  You may want to split an entre with someone and order an extra side salad.  Watch for hidden calories in foods like croutons, bacon, or cheese.  Ask your server to take away the bread basket or chips from your table.  Order a dinner salad as an appetizer. You can eat most foods served in a restaurant. Some foods are better choices than others. Breads and Starches  Recommended: All kinds of bread (wheat, rye, white, oatmeal, [place], [place], raisin), hard or soft dinner rolls, frankfurter or hamburger buns, small bagels, small corn or whole-wheat flour tortillas.  Avoid: Frosted or glazed breads, butter rolls, egg or cheese breads, croissants, sweet rolls, pastries, coffee cake, glazed or frosted doughnuts, muffins. Crackers  Recommended: Animal crackers, graham, rye, saltine, oyster, and matzoth crackers. Bread sticks, melba toast, rusks, pretzels, popcorn (without fat), zwieback toast.  Avoid: High-fat snack crackers or chips. Buttered popcorn. Cereals  Recommended: Hot and cold cereals. Whole grains such as oatmeal or shredded wheat are good choices.  Avoid: Sugar-coated or granola type cereals. Potatoes/Pasta/Rice/Beans  Recommended: Order baked, boiled, or mashed potatoes, rice or noodles without added fat, whole beans. Order gravies, butter, margarine, or sauces on the side so you can control the amount you add.  Avoid: Hash browns or fried potatoes. Potatoes, pasta, or rice prepared with cream or cheese sauce. Potato or pasta salads prepared with large amounts of dressing. Fried beans or fried rice. Vegetables  Recommended: Order steamed, baked, boiled, or stewed vegetables without sauces or extra fat. Ask that sauce be served on  the side. If vegetables are not listed on the menu, ask what is available.  Avoid: Vegetables prepared with cream, butter, or cheese sauce. Fried vegetables. Salad Bars  Recommended: Many of the vegetables at a salad bar are considered "free." Use lemon juice, vinegar, or low-calorie salad dressing (fewer than 20 calories per serving) as "free" dressings for your salad. Look for salad bar ingredients that have no added fat or sugar such as tomatoes, lettuce, cucumbers, broccoli, carrots, onions, and mushrooms.  Avoid: Prepared salads with large amounts of dressing, such as coleslaw, caesar salad, macaroni salad, bean salad, or carrot salad. Fruit  Recommended: Eat fresh fruit or fresh fruit salad without added dressing. A salad bar often offers fresh fruit choices, but canned fruit at a restaurant is usually packed in sugar or syrup.  Avoid: Sweetened canned or frozen fruits, plain or sweetened fruit juice. Fruit salads with dressing, sour cream, or sugar added to them. Meat and Meat Substitutes  Recommended: Order broiled, baked, roasted, or grilled meat, poultry, or fish. Trim off all visible fat. Do not eat the skin of poultry. The size stated on the menu is the raw weight. Meat shrinks by  in cooking (for example, 4 oz raw equals 3 oz cooked meat).  Avoid: Deep-fat fried meat, poultry, or fish. Breaded meats. Eggs  Recommended: Order soft, hard-cooked, poached, or scrambled eggs. Omelets may be okay, depending on what ingredients are added. Egg substitutes are also a good choice.  Avoid: Fried eggs, eggs prepared with cream or cheese sauce. Milk  Recommended: Order low-fat or fat-free milk according to your meal plan. Plain, nonfat yogurt or flavored yogurt with no sugar added may be used as a substitute for milk. Soy milk may also be used.  Avoid: Milk shakes or sweetened milk beverages. Soups and Combination Foods  Recommended: Clear broth or consomm are "free" foods and may be  used as an appetizer. Broth-based  soups with fat removed count as a starch serving and are preferred over cream soups. Soups made with beans or split peas may be eaten but count as a starch.  Avoid: Fatty soups, soup made with cream, cheese soup. Combination foods prepared with excessive amounts of fat or with cream or cheese sauces. Desserts and Sweets  Recommended: Ask for fresh fruit. Sponge or angel food cake without icing, ice milk, no sugar added ice cream, sherbet, or frozen yogurt may fit into your meal plan occasionally.  Avoid: Pastries, puddings, pies, cakes with icing, custard, gelatin desserts. Fats and Oils  Recommended: Choose healthy fats such as olive oil, canola oil, or tub margarine, reduced fat or fat-free sour cream, cream cheese, avocado, or nuts.  Avoid: Any fats in excess of your allowed portion. Deep-fried foods or any food with a large amount of fat. Note: Ask for all fats to be served on the side, and limit your portion sizes according to your meal plan. Document Released: 04/24/2005 Document Revised: 07/17/2011 Document Reviewed: 11/12/2008 [facility] Patient Information 2014 [place], [place].  DASH Diet The DASH diet stands for "Dietary Approaches to Stop Hypertension." It is a healthy eating plan that has been shown to reduce high blood pressure (hypertension) in as little as 14 days, while also possibly providing other significant health benefits. These other health benefits include reducing the risk of breast cancer after menopause and reducing the risk of type 2 diabetes, heart disease, colon cancer, and stroke. Health benefits also include weight loss and slowing kidney failure in patients with chronic kidney disease.  DIET GUIDELINES  Limit salt (sodium). Your diet should contain less than 1500 mg of sodium daily.  Limit refined or processed carbohydrates. Your diet should include mostly whole grains. Desserts and added sugars should be used  sparingly.  Include small amounts of heart-healthy fats. These types of fats include nuts, oils, and tub margarine. Limit saturated and trans fats. These fats have been shown to be harmful in the body. CHOOSING FOODS  The following food groups are based on a 2000 calorie diet. See your Registered Dietitian for individual calorie needs. Grains and Grain Products (6 to 8 servings daily)  Eat More Often: Whole-wheat bread, brown rice, whole-grain or wheat pasta, quinoa, popcorn without added fat or salt (air popped).  Eat Less Often: White bread, white pasta, white rice, cornbread. Vegetables (4 to 5 servings daily)  Eat More Often: Fresh, frozen, and canned vegetables. Vegetables may be raw, steamed, roasted, or grilled with a minimal amount of fat.  Eat Less Often/Avoid: Creamed or fried vegetables. Vegetables in a cheese sauce. Fruit (4 to 5 servings daily)  Eat More Often: All fresh, canned (in natural juice), or frozen fruits. Dried fruits without added sugar. One hundred percent fruit juice ( cup [237 mL] daily).  Eat Less Often: Dried fruits with added sugar. Canned fruit in light or heavy syrup. [employer], Fish, and Poultry (2 servings or less daily. One serving is 3 to 4 oz [85-114 g]).  Eat More Often: Ninety percent or leaner ground beef, tenderloin, sirloin. Round cuts of beef, chicken breast, [place] breast. All fish. Grill, bake, or broil your meat. Nothing should be fried.  Eat Less Often/Avoid: Fatty cuts of meat, [place], or chicken leg, thigh, or wing. Fried cuts of meat or fish. Dairy (2 to 3 servings)  Eat More Often: Low-fat or fat-free milk, low-fat plain or light yogurt, reduced-fat or part-skim cheese.  Eat Less Often/Avoid: Milk (whole, 2%).Whole milk  yogurt. Full-fat cheeses. Nuts, Seeds, and Legumes (4 to 5 servings per week)  Eat More Often: All without added salt.  Eat Less Often/Avoid: Salted nuts and seeds, canned beans with added salt. Fats and Sweets  (limited)  Eat More Often: Vegetable oils, tub margarines without trans fats, sugar-free gelatin. Mayonnaise and salad dressings.  Eat Less Often/Avoid: Coconut oils, palm oils, butter, stick margarine, cream, half and half, cookies, candy, pie. FOR MORE INFORMATION The Dash Diet Eating Plan: www.dashdiet.org Document Released: 04/13/2011 Document Revised: 07/17/2011 Document Reviewed: 04/13/2011 [facility] Patient Information 2014 [place], [place].  Hypertension As your heart beats, it forces blood through your arteries. This force is your blood pressure. If the pressure is too high, it is called hypertension (HTN) or high blood pressure. HTN is dangerous because you may have it and not know it. High blood pressure may mean that your heart has to work harder to pump blood. Your arteries may be narrow or stiff. The extra work puts you at risk for heart disease, stroke, and other problems.  Blood pressure consists of two numbers, a higher number over a lower, 110/72, for example. It is stated as "110 over 72." The ideal is below 120 for the top number (systolic) and under 80 for the bottom (diastolic). Write down your blood pressure today. You should pay close attention to your blood pressure if you have certain conditions such as:  Heart failure.  Prior heart attack.  Diabetes  Chronic kidney disease.  Prior stroke.  Multiple risk factors for heart disease. To see if you have HTN, your blood pressure should be measured while you are seated with your arm held at the level of the heart. It should be measured at least twice. A one-time elevated blood pressure reading (especially in the Emergency Department) does not mean that you need treatment. There may be conditions in which the blood pressure is different between your right and left arms. It is important to see your caregiver soon for a recheck. Most people have essential hypertension which means that there is not a specific cause. This type  of high blood pressure may be lowered by changing lifestyle factors such as:  Stress.  Smoking.  Lack of exercise.  Excessive weight.  Drug/tobacco/alcohol use.  Eating less salt. Most people do not have symptoms from high blood pressure until it has caused damage to the body. Effective treatment can often prevent, delay or reduce that damage. TREATMENT  When a cause has been identified, treatment for high blood pressure is directed at the cause. There are a large number of medications to treat HTN. These fall into several categories, and your caregiver will help you select the medicines that are best for you. Medications may have side effects. You should review side effects with your caregiver. If your blood pressure stays high after you have made lifestyle changes or started on medicines,   Your medication(s) may need to be changed.  Other problems may need to be addressed.  Be certain you understand your prescriptions, and know how and when to take your medicine.  Be sure to follow up with your caregiver within the time frame advised (usually within two weeks) to have your blood pressure rechecked and to review your medications.  If you are taking more than one medicine to lower your blood pressure, make sure you know how and at what times they should be taken. Taking two medicines at the same time can result in blood pressure that is too low.  SEEK IMMEDIATE MEDICAL CARE IF:  You develop a severe headache, blurred or changing vision, or confusion.  You have unusual weakness or numbness, or a faint feeling.  You have severe chest or abdominal pain, vomiting, or breathing problems. MAKE SURE YOU:   Understand these instructions.  Will watch your condition.  Will get help right away if you are not doing well or get worse. Document Released: 04/24/2005 Document Revised: 07/17/2011 Document Reviewed: 12/13/2007 [facility] Patient Information 2014 [place].  Liraglutide  injection What is this medicine? LIRAGLUTIDE (LIR a GLOO tide) is used to improve blood sugar control in adults with type 2 diabetes. This medicine may be used with other oral diabetes medicines. This medicine may be used for other purposes; ask your health care provider or pharmacist if you have questions. COMMON BRAND NAME(S): Victoza What should I tell my health care provider before I take this medicine? They need to know if you have any of these conditions: -endocrine tumors (MEN 2) or if someone in your family had these tumors -gallstones -high cholesterol -history of alcohol abuse problem -history of pancreatitis -kidney disease or if you are on dialysis -liver disease -previous swelling of the tongue, face, or lips with difficulty breathing, difficulty swallowing, hoarseness, or tightening of the throat -stomach problems -thyroid cancer or if someone in your family had thyroid cancer -an unusual or allergic reaction to liraglutide, medicines, foods, dyes, or preservatives -pregnant or trying to get pregnant -breast-feeding How should I use this medicine? This medicine is for injection under the skin of your upper leg, stomach area, or upper arm. You will be taught how to prepare and give this medicine. Use exactly as directed. Take your medicine at regular intervals. Do not take it more often than directed. It is important that you put your used needles and syringes in a special sharps container. Do not put them in a trash can. If you do not have a sharps container, call your pharmacist or healthcare provider to get one. A special MedGuide will be given to you by the pharmacist with each prescription and refill. Be sure to read this information carefully each time. Talk to your pediatrician regarding the use of this medicine in children. Special care may be needed. Overdosage: If you think you've taken too much of this medicine contact a poison control center or emergency room at  once. Overdosage: If you think you have taken too much of this medicine contact a poison control center or emergency room at once. NOTE: This medicine is only for you. Do not share this medicine with others. What if I miss a dose? If you miss a dose, take it as soon as you can. If it is almost time for your next dose, take only that dose. Do not take double or extra doses. What may interact with this medicine? -acetaminophen -atorvastatin -birth control pills -digoxin -griseofulvin -lisinoprilMany medications may cause changes in blood sugar, these include: -alcohol containing beverages -aspirin and aspirin-like drugs -chloramphenicol -chromium -diuretics -male hormones, such as estrogens or progestins, birth control pills -heart medicines -isoniazid -male hormones or anabolic steroids -medications for weight loss -medicines for allergies, asthma, cold, or cough -medicines for mental problems -medicines called MAO inhibitors - Nardil, Parnate, Marplan, Eldepryl -niacin -NSAIDS, such as ibuprofen -pentamidine -phenytoin -probenecid -quinolone antibiotics such as ciprofloxacin, levofloxacin, ofloxacin -some herbal dietary supplements -steroid medicines such as prednisone or cortisone -thyroid hormonesSome medications can hide the warning symptoms of low blood sugar (hypoglycemia). You may need to  monitor your blood sugar more closely if you are taking one of these medications. These include: -beta-blockers, often used for high blood pressure or heart problems (examples include atenolol, metoprolol, propranolol) -clonidine -guanethidine -reserpine This list may not describe all possible interactions. Give your health care provider a list of all the medicines, herbs, non-prescription drugs, or dietary supplements you use. Also tell them if you smoke, drink alcohol, or use illegal drugs. Some items may interact with your medicine. What should I watch for while using this  medicine? Visit your doctor or health care professional for regular checks on your progress. A test called the HbA1C (A1C) will be monitored. This is a simple blood test. It measures your blood sugar control over the last 2 to 3 months. You will receive this test every 3 to 6 months. Learn how to check your blood sugar. Learn the symptoms of low and high blood sugar and how to manage them. Always carry a quick-source of sugar with you in case you have symptoms of low blood sugar. Examples include hard sugar candy or glucose tablets. Make sure others know that you can choke if you eat or drink when you develop serious symptoms of low blood sugar, such as seizures or unconsciousness. They must get medical help at once. Tell your doctor or health care professional if you have high blood sugar. You might need to change the dose of your medicine. If you are sick or exercising more than usual, you might need to change the dose of your medicine. Do not skip meals. Ask your doctor or health care professional if you should avoid alcohol. Many nonprescription cough and cold products contain sugar or alcohol. These can affect blood sugar. Wear a medical ID bracelet or chain, and carry a card that describes your disease and details of your medicine and dosage times. What side effects may I notice from receiving this medicine? Side effects that you should report to your doctor or health care professional as soon as possible: -allergic reactions like skin rash, itching or hives, swelling of the face, lips, or tongue -breathing problems -fever, chills -loss of appetite -signs and symptoms of low blood sugar such as feeling anxious, confusion, dizziness, increased hunger, unusually weak or tired, sweating, shakiness, cold, irritable, headache, blurred vision, fast heartbeat, loss of consciousness -trouble passing urine or change in the amount of urine -unusual stomach pain or upset -vomiting  Side effects that  usually do not require medical attention (Report these to your doctor or health care professional if they continue or are bothersome.): -diarrhea -headache -nausea This list may not describe all possible side effects. Call your doctor for medical advice about side effects. You may report side effects to FDA at 1-800-FDA-1088. Where should I keep my medicine? Keep out of the reach of children. Store unopened pen in a refrigerator between 2 and 8 degrees C (36 and 46 degrees F). Do not freeze or use if the medicine has been frozen. Protect from light and excessive heat. After you first use the pen, it can be stored at room temperature between 15 and 30 degrees C (59 and 86 degrees F) or in a refrigerator. Throw away your used pen after 30 days or after the expiration date, whichever comes first. Do not store your pen with the needle attached. If the needle is left on, medicine may leak from the pen. NOTE: This sheet is a summary. It may not cover all possible information. If you have questions about this  medicine, talk to your doctor, pharmacist, or health care provider.  2014, Elsevier/Gold Standard. (2012-08-07 12:24:45)  Exenatide injection solution What is this medicine? EXENATIDE (ex EN a tide) is used to improve blood sugar control in adults with type 2 diabetes. This medicine may be used with other oral diabetes medicines. This medicine may be used for other purposes; ask your health care provider or pharmacist if you have questions. COMMON BRAND NAME(S): Byetta What should I tell my health care provider before I take this medicine? They need to know if you have any of these conditions: -history of pancreatitis -kidney disease or if you are on dialysis -stomach problems -an unusual or allergic reaction to exenatide, medicines, foods, dyes, or preservatives -pregnant or trying to get pregnant -breast-feeding How should I use this medicine? This medicine is for injection under the skin  of your upper leg, stomach area, or upper arm. You will be taught how to prepare and give this medicine. Use exactly as directed. Take your medicine at regular intervals. Do not take it more often than directed. It is important that you put your used needles and syringes in a special sharps container. Do not put them in a trash can. If you do not have a sharps container, call your pharmacist or healthcare provider to get one. A special MedGuide will be given to you by the pharmacist with each prescription and refill. Be sure to read this information carefully each time.Talk to your pediatrician regarding the use of this medicine in children. Special care may be needed. Overdosage: If you think you have taken too much of this medicine contact a poison control center or emergency room at once. NOTE: This medicine is only for you. Do not share this medicine with others. What if I miss a dose? If you miss a dose, take it as soon as you can. If it is almost time for your next dose, take only that dose. Do not take double or extra doses. What may interact with this medicine? Do not take this medicine with any of the following medications: -gatifloxacin This medicine may also interact with the following medications: -acetaminophen -birth control pills -digoxin -lisinopril -lovastatin -sulfonylureas -warfarin Many medications may cause changes in blood sugar, these include: -alcohol containing beverages -aspirin and aspirin-like drugs -chloramphenicol -chromium -diuretics -male hormones, such as estrogens or progestins, birth control pills -heart medicines -isoniazid -male hormones or anabolic steroids -medications for weight loss -medicines for allergies, asthma, cold, or cough -medicines for mental problems -medicines called MAO inhibitors - Nardil, Parnate, Marplan, Eldepryl -niacin -NSAIDS, such as ibuprofen -pentamidine -phenytoin -probenecid -quinolone antibiotics such as  ciprofloxacin, levofloxacin, ofloxacin -some herbal dietary supplements -steroid medicines such as prednisone or cortisone -thyroid hormones Some medications can hide the warning symptoms of low blood sugar (hypoglycemia). You may need to monitor your blood sugar more closely if you are taking one of these medications. These include: -beta-blockers, often used for high blood pressure or heart problems (examples include atenolol, metoprolol, propranolol) -clonidine -guanethidine -reserpine This list may not describe all possible interactions. Give your health care provider a list of all the medicines, herbs, non-prescription drugs, or dietary supplements you use. Also tell them if you smoke, drink alcohol, or use illegal drugs. Some items may interact with your medicine. What should I watch for while using this medicine? Visit your doctor or health care professional for regular checks on your progress. A test called the HbA1C (A1C) will be monitored. This is a simple blood test.  It measures your blood sugar control over the last 2 to 3 months. You will receive this test every 3 to 6 months. Learn how to check your blood sugar. Learn the symptoms of low and high blood sugar and how to manage them. Always carry a quick-source of sugar with you in case you have symptoms of low blood sugar. Examples include hard sugar candy or glucose tablets. Make sure others know that you can choke if you eat or drink when you develop serious symptoms of low blood sugar, such as seizures or unconsciousness. They must get medical help at once. Tell your doctor or health care professional if you have high blood sugar. You might need to change the dose of your medicine. If you are sick or exercising more than usual, you might need to change the dose of your medicine. Do not skip meals. Ask your doctor or health care professional if you should avoid alcohol. Many nonprescription cough and cold products contain sugar or  alcohol. These can affect blood sugar. Wear a medical ID bracelet or chain, and carry a card that describes your disease and details of your medicine and dosage times. What side effects may I notice from receiving this medicine? Side effects that you should report to your doctor or health care professional as soon as possible: -allergic reactions like skin rash, itching or hives, swelling of the face, lips, or tongue -breathing problems -signs and symptoms of low blood sugar such as feeling anxious, confusion, dizziness, increased hunger, unusually weak or tired, sweating, shakiness, cold, irritable, headache, blurred vision, fast heartbeat, loss of consciousness -swelling of the ankles, feet, hands -trouble passing urine or change in the amount of urine -unusual stomach pain or upset -unusually weak or tired -vomiting Side effects that usually do not require medical attention (report to your doctor or health care professional if they continue or are bothersome): -constipation -diarrhea -dizziness -headache -heartburn -nausea This list may not describe all possible side effects. Call your doctor for medical advice about side effects. You may report side effects to FDA at 1-800-FDA-1088. Where should I keep my medicine? Keep out of the reach of children. Store unopened pen in a refrigerator between 2 and 8 degrees C (36 and 46 degrees F). Do not freeze or use if the medicine has been frozen. Protect from light and excessive heat. After you first use the pen, it should be kept at a temperature not to exceed 25 degrees C (77 degrees F). Throw away your used pen after 30 days or after the expiration date, whichever comes first. Do not store your pen with the needle attached. If the needle is left on, medicine may leak from the pen or air bubbles may form in the cartridge. NOTE: This sheet is a summary. It may not cover all possible information. If you have questions about this medicine, talk to  your doctor, pharmacist, or health care provider.  2014, Elsevier/Gold Standard. (2012-08-07 12:27:26)

## 2013-07-10 NOTE — Assessment & Plan Note (Addendum)
Lab Results  Component Value Date   HGBA1C 8.7 07/10/2013   HGBA1C 7.7* 04/21/2013   HGBA1C 7.5 12/05/2012     Assessment: Diabetes control: fair control Progress toward A1C goal:  deteriorated Comments: will check HA1C today   Plan: Medications:  continue current medications (lantus 67 u, Humalog 14, 24, 24). Added Victoza 0.6 mg sq qam today due to hyperglycemia and elevated HA1C  Home glucose monitoring: Frequency: 3 times a day Timing: before meals Instruction/counseling given: reminded to bring blood glucose meter & log to each visit, reminded to bring medications to each visit and provided printed educational material Educational resources provided: other (see comments) Self management tools provided: other (see comments);copy of home glucose meter download Other plans: f/u in 3 months, CMET on 3/18, will call in 1 week to check on cbgs, reminded to f/u with eye MD will get retinal exam today, reviewed side effects of Victoza with printed info

## 2013-07-16 NOTE — Progress Notes (Signed)
Case discussed with Dr. McLean at the time of the visit.  We reviewed the resident's history and exam and pertinent patient test results.  I agree with the assessment, diagnosis, and plan of care documented in the resident's note.     

## 2013-07-21 NOTE — Addendum Note (Signed)
Addended by: Truddie Crumble on: 07/21/2013 09:45 AM   Modules accepted: Orders

## 2013-07-23 ENCOUNTER — Other Ambulatory Visit: Payer: Self-pay

## 2013-08-01 ENCOUNTER — Other Ambulatory Visit: Payer: Self-pay | Admitting: *Deleted

## 2013-08-01 NOTE — Telephone Encounter (Signed)
Year's supply Rx'd Jan 2015

## 2013-08-01 NOTE — Telephone Encounter (Signed)
Requesting solostar pens. Thanks

## 2013-08-04 ENCOUNTER — Ambulatory Visit (INDEPENDENT_AMBULATORY_CARE_PROVIDER_SITE_OTHER): Payer: Medicare Other | Admitting: Infectious Disease

## 2013-08-04 ENCOUNTER — Encounter: Payer: Self-pay | Admitting: Infectious Disease

## 2013-08-04 VITALS — BP 131/68 | HR 85 | Temp 98.8°F | Wt 170.0 lb

## 2013-08-04 DIAGNOSIS — G547 Phantom limb syndrome without pain: Secondary | ICD-10-CM | POA: Diagnosis not present

## 2013-08-04 DIAGNOSIS — Z0000 Encounter for general adult medical examination without abnormal findings: Secondary | ICD-10-CM | POA: Diagnosis not present

## 2013-08-04 DIAGNOSIS — IMO0001 Reserved for inherently not codable concepts without codable children: Secondary | ICD-10-CM | POA: Diagnosis not present

## 2013-08-04 DIAGNOSIS — R238 Other skin changes: Secondary | ICD-10-CM

## 2013-08-04 DIAGNOSIS — R509 Fever, unspecified: Secondary | ICD-10-CM | POA: Diagnosis not present

## 2013-08-04 DIAGNOSIS — B20 Human immunodeficiency virus [HIV] disease: Secondary | ICD-10-CM | POA: Diagnosis not present

## 2013-08-04 DIAGNOSIS — E1149 Type 2 diabetes mellitus with other diabetic neurological complication: Secondary | ICD-10-CM | POA: Diagnosis not present

## 2013-08-04 DIAGNOSIS — I10 Essential (primary) hypertension: Secondary | ICD-10-CM | POA: Diagnosis not present

## 2013-08-04 DIAGNOSIS — Z113 Encounter for screening for infections with a predominantly sexual mode of transmission: Secondary | ICD-10-CM | POA: Diagnosis not present

## 2013-08-04 DIAGNOSIS — R52 Pain, unspecified: Secondary | ICD-10-CM | POA: Diagnosis not present

## 2013-08-04 DIAGNOSIS — R5383 Other fatigue: Secondary | ICD-10-CM | POA: Diagnosis not present

## 2013-08-04 DIAGNOSIS — R5381 Other malaise: Secondary | ICD-10-CM | POA: Diagnosis not present

## 2013-08-04 DIAGNOSIS — E1159 Type 2 diabetes mellitus with other circulatory complications: Secondary | ICD-10-CM

## 2013-08-04 DIAGNOSIS — G35 Multiple sclerosis: Secondary | ICD-10-CM | POA: Diagnosis not present

## 2013-08-04 DIAGNOSIS — R110 Nausea: Secondary | ICD-10-CM | POA: Diagnosis not present

## 2013-08-04 DIAGNOSIS — I798 Other disorders of arteries, arterioles and capillaries in diseases classified elsewhere: Secondary | ICD-10-CM | POA: Diagnosis not present

## 2013-08-04 DIAGNOSIS — R55 Syncope and collapse: Secondary | ICD-10-CM | POA: Diagnosis not present

## 2013-08-04 DIAGNOSIS — R51 Headache: Secondary | ICD-10-CM | POA: Diagnosis not present

## 2013-08-04 DIAGNOSIS — E785 Hyperlipidemia, unspecified: Secondary | ICD-10-CM

## 2013-08-04 DIAGNOSIS — R109 Unspecified abdominal pain: Secondary | ICD-10-CM | POA: Diagnosis not present

## 2013-08-04 DIAGNOSIS — I739 Peripheral vascular disease, unspecified: Secondary | ICD-10-CM | POA: Diagnosis not present

## 2013-08-04 DIAGNOSIS — R609 Edema, unspecified: Secondary | ICD-10-CM | POA: Diagnosis not present

## 2013-08-04 MED ORDER — GLUCERNA PO LIQD: 237.0000 mL | Freq: Two times a day (BID) | ORAL | Status: DC

## 2013-08-04 MED ORDER — OXYCODONE HCL 10 MG PO TABS: 10.0000 mg | ORAL_TABLET | Freq: Two times a day (BID) | ORAL | Status: DC | PRN

## 2013-08-04 NOTE — Progress Notes (Signed)
  Subjective:    Patient ID: Nathan [name], male    DOB: 07/18/1950, 63 y.o.   MRN: [id number]  HPI  Nathan [name] is a 63 y.o. male whose HIV has been perfectly suppressed with salvage regimen of  twice daily combivir, isentress and once daily viread with undetectable viral load and health cd4 count.   I saw him this winter when he had FUO and admitted him to [facility] and he had workup that did not reveal a clear cut cause. The IM Teaching service who thought fevers might have been due to statin, which was stopped. Fevers abated but I did not believe they were ever due to statin and he restarted with no problems of fevers or myalgias whatsoever.       Review of Systems  Constitutional: Negative for chills, diaphoresis, activity change, appetite change, fatigue and unexpected weight change.  HENT: Negative for rhinorrhea, sinus pressure, sneezing and trouble swallowing.   Eyes: Negative for photophobia and visual disturbance.  Respiratory: Negative for chest tightness, shortness of breath and stridor.   Cardiovascular: Negative for palpitations and leg swelling.  Gastrointestinal: Negative for constipation, blood in stool, abdominal distention and anal bleeding.  Genitourinary: Negative for dysuria, hematuria, flank pain and difficulty urinating.  Musculoskeletal: Negative for arthralgias, back pain, gait problem, joint swelling and myalgias.  Skin: Negative for color change, pallor and wound.  Neurological: Negative for dizziness, tremors, weakness and light-headedness.  Hematological: Negative for adenopathy. Does not bruise/bleed easily.  Psychiatric/Behavioral: Negative for behavioral problems, confusion, sleep disturbance, dysphoric mood, decreased concentration and agitation.       Objective:   Physical Exam  Nursing note and vitals reviewed. Constitutional: He is oriented to person, place, and time. No distress.  Fatigued appearing  HENT:  Head: Normocephalic and atraumatic.   Mouth/Throat: Oropharynx is clear and moist. No oropharyngeal exudate.  Eyes: Conjunctivae and EOM are normal. No scleral icterus.  Neck: Normal range of motion. Neck supple. No JVD present.  Cardiovascular: Normal rate and regular rhythm.   Pulmonary/Chest: Effort normal. No respiratory distress. He has no wheezes.  Abdominal: Soft. Bowel sounds are normal. He exhibits no distension.  Musculoskeletal: He exhibits no edema and no tenderness.  Lymphadenopathy:    He has no cervical adenopathy.  Neurological: He is alert and oriented to person, place, and time.  Skin: Skin is warm and dry. He is not diaphoretic. No erythema. No pallor.  Psychiatric: He has a normal mood and affect. His behavior is normal. Judgment and thought content normal.     Assessment & Plan:   HIV: perfect control on salvage regimen, repeat labs in October 2015  I spent greater than 25 minutes with the patient including greater than 50% of time in face to face counsel of the patient and in coordination of their care.   FUO: resolved, not clear what caused this, doubt it was statin and he is tolerating this again  DM: followed closely in IM clinic

## 2013-08-06 ENCOUNTER — Other Ambulatory Visit: Payer: Self-pay | Admitting: Infectious Disease

## 2013-08-06 ENCOUNTER — Other Ambulatory Visit: Payer: Self-pay | Admitting: Internal Medicine

## 2013-08-06 ENCOUNTER — Other Ambulatory Visit: Payer: Self-pay | Admitting: *Deleted

## 2013-08-06 DIAGNOSIS — B20 Human immunodeficiency virus [HIV] disease: Secondary | ICD-10-CM

## 2013-08-06 DIAGNOSIS — E7800 Pure hypercholesterolemia, unspecified: Secondary | ICD-10-CM

## 2013-08-06 MED ORDER — RALTEGRAVIR POTASSIUM 400 MG PO TABS: 400.0000 mg | ORAL_TABLET | Freq: Two times a day (BID) | ORAL | Status: DC

## 2013-08-06 MED ORDER — TENOFOVIR DISOPROXIL FUMARATE 300 MG PO TABS: 300.0000 mg | ORAL_TABLET | Freq: Every day | ORAL | Status: DC

## 2013-08-06 MED ORDER — INSULIN LISPRO 100 UNIT/ML (KWIKPEN): 14.0000 [IU] | PEN_INJECTOR | Freq: Three times a day (TID) | SUBCUTANEOUS | Status: DC

## 2013-08-06 MED ORDER — INSULIN LISPRO 100 UNIT/ML (KWIKPEN): PEN_INJECTOR | SUBCUTANEOUS | Status: DC

## 2013-08-06 MED ORDER — LAMIVUDINE-ZIDOVUDINE 150-300 MG PO TABS: 1.0000 | ORAL_TABLET | Freq: Two times a day (BID) | ORAL | Status: DC

## 2013-08-07 ENCOUNTER — Other Ambulatory Visit: Payer: Self-pay | Admitting: *Deleted

## 2013-08-07 DIAGNOSIS — E1159 Type 2 diabetes mellitus with other circulatory complications: Secondary | ICD-10-CM

## 2013-08-07 NOTE — Telephone Encounter (Addendum)
Received PA request from [facility]  Dr. [phone]) for pt's Victoza.  Preferred meds include Byetta and Bydureon.  Will forward to pcp for review.Despina Hidden Cassady4/2/20154:14 PM       [facility] [id number] Pt ID# [id number]

## 2013-08-10 ENCOUNTER — Other Ambulatory Visit: Payer: Self-pay | Admitting: Internal Medicine

## 2013-08-10 MED ORDER — EXENATIDE 5 MCG/0.02ML ~~LOC~~ SOPN: 5.0000 ug | PEN_INJECTOR | Freq: Two times a day (BID) | SUBCUTANEOUS | Status: DC

## 2013-08-11 ENCOUNTER — Telehealth: Payer: Self-pay | Admitting: Internal Medicine

## 2013-08-11 ENCOUNTER — Telehealth: Payer: Self-pay | Admitting: Infectious Disease

## 2013-08-11 MED ORDER — OMEPRAZOLE 40 MG PO CPDR: 40.0000 mg | DELAYED_RELEASE_CAPSULE | Freq: Every day | ORAL | Status: DC

## 2013-08-11 NOTE — Telephone Encounter (Signed)
Can we check in with [facility] and see how he is doing. He apparently had several fevers over the weekend and stopped his statin.  There is one "opening" in Dr Storm Frisk schedule for tomorrow potentially a few on Friday  Otherwise I am double booked as inside ID MD on wed and Dr. Baxter Flattery double booked x 3 on thursday

## 2013-08-11 NOTE — Telephone Encounter (Signed)
Patient never got the Victoza. Insurance will not approve.  His blood sugar has been running better (<200 to 200).  He has not been feeling well he had a fever of 100, 101F and 103 F fever on Saturday.  His appetite was somewhat decreased.  He plans to sch f/u with Dr. Tommy Medal.  He quit taking the cholesterol medication again (he has not taken cholesterol medication in 5 days). Rec he f/u with Dr. Tommy Medal ASAP.    Aundra Dubin MD

## 2013-08-12 ENCOUNTER — Telehealth: Payer: Self-pay | Admitting: *Deleted

## 2013-08-12 NOTE — Telephone Encounter (Signed)
Received phone call from [facility] stating fevers began Thursday 4/2 and continued thru to Sunday 4/5. Most fevers were around 101 and the highest was 103 on Saturday. During this time he felt weak, developed chills, and decreased appetite. On Saturday he vomited x 2. He stated he felt the same when he was admitted to the hospital in January. He stopped taking his atorvastatin as soon as the symptoms developed and symptoms ceased on Monday. He did not want to schedule an appointment since he was feeling better. He feels that these symptoms are due to the statin and wants to know what to do. I told him I would speak with Dr. Tommy Medal tomorrow and find out if he would still like for Nathan [name] to come in and also to address statin issue. Eliezer Champagne RN

## 2013-08-12 NOTE — Telephone Encounter (Signed)
Lets keep him off the statin then and see him in followup

## 2013-08-12 NOTE — Telephone Encounter (Signed)
I left a message for Nathan [name] stating that I was checking on him to see how he was feeling and for him to call me back to see if we can schedule an appointment for him. Looks like the only appointments open for this week are Friday with [facility]. [facility]. Awaiting a return phone call.  Nathan Champagne RN

## 2013-08-14 ENCOUNTER — Encounter: Payer: Self-pay | Admitting: Internal Medicine

## 2013-08-28 ENCOUNTER — Ambulatory Visit (INDEPENDENT_AMBULATORY_CARE_PROVIDER_SITE_OTHER): Payer: Medicare Other | Admitting: Internal Medicine

## 2013-08-28 ENCOUNTER — Telehealth: Payer: Self-pay | Admitting: *Deleted

## 2013-08-28 ENCOUNTER — Encounter: Payer: Self-pay | Admitting: Internal Medicine

## 2013-08-28 ENCOUNTER — Ambulatory Visit (HOSPITAL_COMMUNITY)
Admission: RE | Admit: 2013-08-28 | Discharge: 2013-08-28 | Disposition: A | Discharge status: Home / Self Care | Payer: Medicare Other | Source: Ambulatory Visit | Attending: Internal Medicine | Admitting: Internal Medicine

## 2013-08-28 VITALS — BP 95/58 | HR 91 | Temp 98.1°F | Ht 73.0 in | Wt 167.1 lb

## 2013-08-28 DIAGNOSIS — R05 Cough: Secondary | ICD-10-CM | POA: Diagnosis not present

## 2013-08-28 DIAGNOSIS — R509 Fever, unspecified: Secondary | ICD-10-CM | POA: Diagnosis not present

## 2013-08-28 DIAGNOSIS — R059 Cough, unspecified: Secondary | ICD-10-CM | POA: Diagnosis not present

## 2013-08-28 LAB — CBC WITH DIFFERENTIAL/PLATELET
BASOS ABS: 0 10*3/uL (ref 0.0–0.1)
Basophils Relative: 0 % (ref 0–1)
EOS ABS: 0.1 10*3/uL (ref 0.0–0.7)
EOS PCT: 2 % (ref 0–5)
HCT: 29.4 % — ABNORMAL LOW (ref 39.0–52.0)
Hemoglobin: 9.8 g/dL — ABNORMAL LOW (ref 13.0–17.0)
Lymphocytes Relative: 41 % (ref 12–46)
Lymphs Abs: 1.1 10*3/uL (ref 0.7–4.0)
MCH: 32.6 pg (ref 26.0–34.0)
MCHC: 33.3 g/dL (ref 30.0–36.0)
MCV: 97.7 fL (ref 78.0–100.0)
Monocytes Absolute: 0.3 10*3/uL (ref 0.1–1.0)
Monocytes Relative: 9 % (ref 3–12)
Neutro Abs: 1.3 10*3/uL — ABNORMAL LOW (ref 1.7–7.7)
Neutrophils Relative %: 48 % (ref 43–77)
Platelets: 277 10*3/uL (ref 150–400)
RBC: 3.01 MIL/uL — ABNORMAL LOW (ref 4.22–5.81)
RDW: 16.1 % — AB (ref 11.5–15.5)
WBC: 2.8 10*3/uL — ABNORMAL LOW (ref 4.0–10.5)

## 2013-08-28 LAB — COMPLETE METABOLIC PANEL WITH GFR
ALT: 77 U/L — ABNORMAL HIGH (ref 0–53)
AST: 76 U/L — AB (ref 0–37)
Albumin: 3.4 g/dL — ABNORMAL LOW (ref 3.5–5.2)
Alkaline Phosphatase: 176 U/L — ABNORMAL HIGH (ref 39–117)
BILIRUBIN TOTAL: 0.5 mg/dL (ref 0.2–1.2)
BUN: 10 mg/dL (ref 6–23)
CO2: 22 meq/L (ref 19–32)
CREATININE: 0.81 mg/dL (ref 0.50–1.35)
Calcium: 8.8 mg/dL (ref 8.4–10.5)
Chloride: 93 mEq/L — ABNORMAL LOW (ref 96–112)
GFR, Est Non African American: >89 mL/min
Glucose, Bld: 158 mg/dL — ABNORMAL HIGH (ref 70–99)
Potassium: 4.6 mEq/L (ref 3.5–5.3)
Sodium: 128 mEq/L — ABNORMAL LOW (ref 135–145)
Total Protein: 7.2 g/dL (ref 6.0–8.3)

## 2013-08-28 LAB — RPR

## 2013-08-28 NOTE — Telephone Encounter (Signed)
Agree with need for in clinic evaluation

## 2013-08-28 NOTE — Patient Instructions (Signed)
Follow up with Dr. Tommy Medal in a week. We will follow up on the labs and will call you if there is anything abnormal. Take all your medications as advised below.

## 2013-08-28 NOTE — Assessment & Plan Note (Addendum)
Unclear etiology.  Differential is very broad and include drug eruptions, tertiary gummatous syphilis, cutaneous tuberculosis vs underlying disseminated TB manifesting as cutaneous TB vs pneumocystis cutaneous manifestations etc. Patient had extensive work up in December 2014 including pan CT, PET scan and several other studies without an obvious explanation for fever. At the time of discharge, his fevers were thought to be secondary to statin as fever subsided after stopping statin. Lack of appetite, weight loss, late afternoon to evening fevers concerning for TB. History of syphilis in the past makes syphilitic rash as a possibility as well. Discussed the case with Dr. Eppie Gibson regarding further management.  Plans Check CXR, Quantiferon Gold assay to rule out TB. Check CBC, CMP  Check RPR. Follow up with ID in a week or so. Follow up as needed or if symptoms worsen.

## 2013-08-28 NOTE — Telephone Encounter (Signed)
Pt called with c/o low grade temp on and off for 2 weeks.  Today rash over body, under skin.  No pain, no itching. No know cause.  No other c/o  Will see today at 1:30

## 2013-08-28 NOTE — Progress Notes (Signed)
Subjective:   Patient ID: Nathan [name] male   DOB: [birth year] 63 y.o.   MRN: [id number]  HPI: [name] is a 63 y.o. man with PMH significant for HIV with CD4 count 632 (Jan 2015), HTN, DM-II comes to the office with CC of low grade fever for the last 4-6 weeks and rash since yesterday.  Patient reports that he has had low grade fevers for the last 4-6 weeks. Fevers occur usually in the late afternoon, 57F -101 F, associated with chills but no rigors. Patient reports that he was hospitalized for fever of unknown origin in December 2014 and has had an extensive work up including the pan CT and PET scan which didn't reveal any explanation. His fevers were thought to be secondary to statin use as they stopped after stopping statin. Patient was discharged home with instructions not to take Statin. Patient reports that he is currently not taking the statin and still has fevers for the last 4-6 weeks.   He reports that he noticed rash all over the body. He reports that he noticed the rash in his left leg first and then when he looked at his body, he has rash all over. The rash is "bumpy" and without any itching or pain. He reports a fever of 102 F this morning. He denies any recent travel, exposure to bed bugs, starting any new medications, discharge from the penis.   He reports that he feels weak and tired, has no appetite and had lost about 6-7 lbs in 6 weeks.  He denies any headaches, nausea, vomiting, weakness, palpitations, chest pain, SOB, cough.  He reports that he lives with his room mate who is from [place] for the last 2.5 years. He denies any cough or SOB in his room mate and reports that he is a "healthy man". He denies any history of tuberculosis or any known exposure to tuberculosis patients. He reports that he had syphilis more than "10-15 years" ago and was treated for it.    Past Medical History  Diagnosis Date  . Diabetes mellitus   . HIV infection     undetectable viral  load and CD4 ct 667 as of 11/2011  . Colon polyps     noted previous colonoscopy [facility]  . Hyperlipidemia   . Multiple sclerosis     in remission as of 11/2011 (diagnosed late 1980s)  . GERD (gastroesophageal reflux disease)   . Hypertension   . MRSA (methicillin resistant Staphylococcus aureus)   . PCP (pneumocystis jiroveci pneumonia)     2002  . History of syphilis     noted [facility] records  . Asthma     per 2003 [facility] pulm records pfts   . DDD (degenerative disc disease)     cervical spine  . PVD (peripheral vascular disease)     Left Stent 07/02/2008  . Depression   . Pneumonia   . UTI (urinary tract infection)   . Clotting disorder   . Blood dyscrasia     HIV  . Fever     unknwon origin  . Hep C w/o coma, chronic    Current Outpatient Prescriptions  Medication Sig Dispense Refill  . BD PEN NEEDLE NANO U/F 32G X 4 MM MISC USE AS DIRECTED FOUR TIMES DAILY  100 each  0  . Blood Glucose Monitoring Suppl (BAYER CONTOUR MONITOR) W/DEVICE KIT Use to check blood sugar as instructed up to 4 times a day  1 kit  0  .  clopidogrel (PLAVIX) 75 MG tablet Take 1 tablet (75 mg total) by mouth daily.  90 tablet  4  . exenatide (BYETTA) 5 MCG/0.02ML SOPN injection Inject 0.02 mLs (5 mcg total) into the skin 2 (two) times daily with a meal.  1.2 mL  5  . GLUCERNA (GLUCERNA) LIQD Take 237 mLs by mouth 2 (two) times daily between meals.  237 mL  11  . glucose blood (BAYER CONTOUR NEXT TEST) test strip Four times daily before meals and at bedtime, insulin dependent, ICD CODE 250.02  100 each  11  . insulin glargine (LANTUS) 100 UNIT/ML injection Inject 0.67 mLs (67 Units total) into the skin at bedtime.  10 mL  12  . insulin lispro (HUMALOG KWIKPEN) 100 UNIT/ML KiwkPen 14 units with breakfast; 24 units with lunch and dinner  15 mL  11  . Insulin Pen Needle (PEN NEEDLES 3/16") 31G X 5 MM MISC 1 application by Does not apply route 4 (four) times daily. Use to inject insulin twice daily Dx code 250.00   120 each  11  . lamiVUDine-zidovudine (COMBIVIR) 150-300 MG per tablet Take 1 tablet by mouth 2 (two) times daily.  60 tablet  11  . Lancets MISC Use to Test Blood Sugar Three Times Daily. Dx Code: 250.02  100 each  3  . lisinopril (PRINIVIL,ZESTRIL) 40 MG tablet TAKE 1 TABLET BY MOUTH ONCE DAILY  30 tablet  5  . metFORMIN (GLUCOPHAGE) 500 MG tablet Take 1,000 mg by mouth 2 (two) times daily with a meal.      . omeprazole (PRILOSEC) 40 MG capsule Take 1 capsule (40 mg total) by mouth daily.  90 capsule  1  . Oxycodone HCl 10 MG TABS Take 1 tablet (10 mg total) by mouth 2 (two) times daily as needed.  60 tablet  0  . raltegravir (ISENTRESS) 400 MG tablet Take 1 tablet (400 mg total) by mouth 2 (two) times daily.  60 tablet  11  . tenofovir (VIREAD) 300 MG tablet Take 1 tablet (300 mg total) by mouth daily.  30 tablet  11   No current facility-administered medications for this visit.   Family History  Problem Relation Age of Onset  . Diabetes Mother   . Cancer Brother     colon caner stage 4 as of 11/2011 (unknown age of onset)  . Coronary artery disease Mother   . Prostate cancer Brother   . Coronary artery disease Father    History   Social History  . Marital Status: Single    Spouse Name: N/A    Number of Children: N/A  . Years of Education: N/A   Social History Main Topics  . Smoking status: Former Smoker    Quit date: 05/09/2007  . Smokeless tobacco: Never Used  . Alcohol Use: No  . Drug Use: No  . Sexual Activity: None   Other Topics Concern  . None   Social History Narrative  . None   Review of Systems: As per HPI.   Objective:  Physical Exam: Filed Vitals:   08/28/13 1335  BP: 95/58  Pulse: 91  Temp: 98.1 F (36.7 C)  TempSrc: Oral  Height: _0  (1.854 m)  Weight: 167 lb 1.6 oz (75.796 kg)  SpO2: 97%   Constitutional: Vital signs reviewed.  Patient is a well-developed and well-nourished and is in no acute distress and cooperative with exam. Alert and  oriented x3.  Head: Normocephalic and atraumatic Nose: No erythema or drainage noted.  Turbinates normal Mouth: no erythema or exudates, MMM Eyes: PERRL, EOMI, conjunctivae normal, No scleral icterus.  Neck: Supple, Trachea midline normal ROM, No JVD, mass, thyromegaly, or carotid bruit present.  Cardiovascular: RRR, S1 normal, S2 normal, no MRG, pulses symmetric and intact bilaterally Pulmonary/Chest: normal respiratory effort, CTAB, no wheezes, rales, or rhonchi Abdominal: Soft. Non-tender, non-distended, bowel sounds are normal, no masses, organomegaly, or guarding present.  GU: no CVA tenderness Musculoskeletal: No joint deformities, erythema, or stiffness, ROM full and no nontender Hematology: Left inguinal Lymphadenopathy noted. Neurological: A&O x3, Strength is normal and symmetric bilaterally, cranial nerve II-XII are grossly intact, no focal motor deficit, sensory intact to light touch bilaterally.  Skin: Multiple, diffuse erythematous papules of varying sizes noted over the entire body including the palms, soles. Skin lesions are predominant over the thorax and abdomen (both anteriorly and posteriorly) and less predominant over the left leg and upper extremities.   Psychiatric: Normal mood and affect. speech and behavior is normal. Judgment and thought content normal. Cognition and memory are normal.    Assessment & Plan:

## 2013-08-29 ENCOUNTER — Telehealth: Payer: Self-pay | Admitting: *Deleted

## 2013-08-29 NOTE — Telephone Encounter (Signed)
Patient called stating he is having problems with fevers again and was seen by IM 08/28/13. They wanted him to follow up with Dr. Tommy Medal within a week. Given first available appt for 09/10/13. Myrtis Hopping

## 2013-08-31 NOTE — Progress Notes (Signed)
Case discussed with Dr. Boggala at time of visit.  We reviewed the resident's history and exam and pertinent patient test results.  I agree with the assessment, diagnosis, and plan of care documented in the resident's note. 

## 2013-09-01 ENCOUNTER — Telehealth: Payer: Self-pay | Admitting: *Deleted

## 2013-09-01 LAB — QUANTIFERON TB GOLD ASSAY (BLOOD)
Interferon Gamma Release Assay: NEGATIVE
Mitogen value: 5.89 IU/mL
QUANTIFERON NIL VALUE: 0.11 [IU]/mL
Quantiferon Tb Ag Minus Nil Value: 0.02 IU/mL
TB AG VALUE: 0.13 [IU]/mL

## 2013-09-01 NOTE — Telephone Encounter (Signed)
Relayed message from Dr. Tommy Medal regarding patient's appointment.  Left message notifying him that Dr. Tommy Medal is in touch with the IM physician and the already-scheduled 5/6 is fine.   Nathan Gandy, RN  See result note below: Nathan [name] great to hear from you. DId you ask lab to check for prozone effect as well? (dilution of RPR?)   I am forwarding to my RN staff to see if we can puthim on schedule anytime sooner--but we are pretty overbooked AND this has been going on for seve3ral weeks so I would bet it could wait till next week. Will also ask my RNs staff and Margaretmary Bayley my research RN who knows him to check in. I NEVER thought this was statin related--but

## 2013-09-10 ENCOUNTER — Ambulatory Visit (INDEPENDENT_AMBULATORY_CARE_PROVIDER_SITE_OTHER): Payer: Medicare Other | Admitting: Infectious Disease

## 2013-09-10 ENCOUNTER — Other Ambulatory Visit: Payer: Self-pay | Admitting: Infectious Disease

## 2013-09-10 ENCOUNTER — Encounter: Payer: Self-pay | Admitting: Infectious Disease

## 2013-09-10 VITALS — BP 146/73 | HR 83 | Temp 97.5°F | Wt 163.0 lb

## 2013-09-10 DIAGNOSIS — L270 Generalized skin eruption due to drugs and medicaments taken internally: Secondary | ICD-10-CM

## 2013-09-10 DIAGNOSIS — T887XXA Unspecified adverse effect of drug or medicament, initial encounter: Secondary | ICD-10-CM

## 2013-09-10 DIAGNOSIS — E1159 Type 2 diabetes mellitus with other circulatory complications: Secondary | ICD-10-CM | POA: Diagnosis not present

## 2013-09-10 DIAGNOSIS — B20 Human immunodeficiency virus [HIV] disease: Secondary | ICD-10-CM

## 2013-09-10 DIAGNOSIS — R7989 Other specified abnormal findings of blood chemistry: Secondary | ICD-10-CM | POA: Diagnosis not present

## 2013-09-10 DIAGNOSIS — R64 Cachexia: Secondary | ICD-10-CM | POA: Diagnosis not present

## 2013-09-10 DIAGNOSIS — R945 Abnormal results of liver function studies: Secondary | ICD-10-CM

## 2013-09-10 DIAGNOSIS — E1151 Type 2 diabetes mellitus with diabetic peripheral angiopathy without gangrene: Secondary | ICD-10-CM

## 2013-09-10 DIAGNOSIS — R1011 Right upper quadrant pain: Secondary | ICD-10-CM

## 2013-09-10 DIAGNOSIS — R509 Fever, unspecified: Secondary | ICD-10-CM

## 2013-09-10 DIAGNOSIS — A512 Primary syphilis of other sites: Secondary | ICD-10-CM | POA: Diagnosis not present

## 2013-09-10 DIAGNOSIS — T50905A Adverse effect of unspecified drugs, medicaments and biological substances, initial encounter: Secondary | ICD-10-CM

## 2013-09-10 LAB — RHEUMATOID FACTOR
RHEUMATOID FACTOR: 15 [IU]/mL — AB (ref <=14)
Rhuematoid fact SerPl-aCnc: 16 IU/mL — ABNORMAL HIGH (ref <=14)

## 2013-09-10 LAB — CK: CK TOTAL: 212 U/L (ref 7–232)

## 2013-09-10 LAB — SEDIMENTATION RATE: Sed Rate: 128 mm/hr — ABNORMAL HIGH (ref 0–16)

## 2013-09-10 MED ORDER — PREDNISONE 20 MG PO TABS: 40.0000 mg | ORAL_TABLET | Freq: Every day | ORAL | Status: DC

## 2013-09-10 NOTE — Patient Instructions (Signed)
Harshith   Please stop your:  ISENTRESS  COMBIVIR  AND VIREAD  START PREDNISONE 40MG  DAILY FOR ONE WEEK  PLEASE MEET WITH [facility] TO ADJUST INSUILIN DOSE FURTHER  MAKE FU APPT NEXT [place], [place] TO [place]

## 2013-09-10 NOTE — Progress Notes (Signed)
Subjective:    Patient ID: Nathan [name], male    DOB: 29-Jun-1950, 63 y.o.   MRN: [id number]  Fever  This is a recurrent problem. The current episode started 1 to 4 weeks ago. The problem occurs daily. The problem has been gradually worsening. The maximum temperature noted was 102 to 102.9 F. Associated symptoms include a rash. He has tried acetaminophen for the symptoms. The treatment provided mild relief.  Rash Associated symptoms include a fever. Pertinent negatives include no fatigue, rhinorrhea or shortness of breath.    Nathan [name] is a 63 y.o. male whose HIV has been perfectly suppressed with salvage regimen of  twice daily combivir, isentress and once daily viread with undetectable viral load and health cd4 count. (he has EXTENSIVE R with R to all NNRTI, R to nearly all PI except DRV, with still activity from TNF, AZT and the isentress he is currently on.  I saw him this winter when he had FUO and admitted him to [facility] and he had workup that did not reveal a clear cut cause. The IM Teaching service who thought fevers might have been due to statin, which was stopped. Fevers abated but I did not believe they were ever due to statin and he restarted with no problems of fevers or myalgias whatsoever.   However since then his fevers HAVE returned and have been quite high. They persisted after he stopped his statin.  I was made aware of the fevers but was under the impression via communication with my staff that he did not wish to be seen urgently. He then developed a rash that started on legs and spread over his entire body sparing hands and feet and without mucosal involvement. He saw Dr. Eyvonne Mechanic and Dr. Eppie Gibson and RPR was checked (but without checking for prozone effect( and QF gold  HIs rash has worsened.  I am very concerned that this represents a an adverse drug reaction possibly due to the Isentress, vs another drug vs an autoimmune process that was perhaps not diagnosed in the winter  and which has relapsed.      Review of Systems  Constitutional: Positive for fever. Negative for chills, diaphoresis, activity change, appetite change, fatigue and unexpected weight change.  HENT: Negative for rhinorrhea, sinus pressure, sneezing and trouble swallowing.   Eyes: Negative for photophobia and visual disturbance.  Respiratory: Negative for chest tightness, shortness of breath and stridor.   Cardiovascular: Negative for palpitations and leg swelling.  Gastrointestinal: Negative for constipation, blood in stool, abdominal distention and anal bleeding.  Genitourinary: Negative for dysuria, hematuria, flank pain and difficulty urinating.  Musculoskeletal: Negative for arthralgias, back pain, gait problem, joint swelling and myalgias.  Skin: Positive for rash. Negative for color change, pallor and wound.  Neurological: Negative for dizziness, tremors, weakness and light-headedness.  Hematological: Negative for adenopathy. Does not bruise/bleed easily.  Psychiatric/Behavioral: Negative for behavioral problems, confusion, sleep disturbance, dysphoric mood, decreased concentration and agitation.       Objective:   Physical Exam  Nursing note and vitals reviewed. Constitutional: He is oriented to person, place, and time. No distress.  Fatigued appearing  HENT:  Head: Normocephalic and atraumatic.  Mouth/Throat: Oropharynx is clear and moist. No oropharyngeal exudate.  Eyes: Conjunctivae and EOM are normal. No scleral icterus.  Neck: Normal range of motion. Neck supple. No JVD present.  Cardiovascular: Normal rate and regular rhythm.   Pulmonary/Chest: Effort normal. No respiratory distress. He has no wheezes.  Abdominal: Soft. Bowel sounds  are normal. He exhibits no distension.  Musculoskeletal: He exhibits no edema and no tenderness.  Lymphadenopathy:    He has no cervical adenopathy.  Neurological: He is alert and oriented to person, place, and time.  Skin: Skin is warm  and dry. Rash noted. He is not diaphoretic. There is erythema. No pallor.  Psychiatric: His behavior is normal. Judgment and thought content normal. His mood appears anxious. His affect is angry.   Skin:  Genitalia no lesions on glans, he has MP rash on scrotum    Rash on legs MP   Diffuse MP rash on chest and back see below    Diffuse rash on back MP       Assessment & Plan:   Fever and rash:  I performed punch biopsy at two locations see procedure note below  Differential includes drug rash, syphilis, Auto immune condition. He has TOO healthy a CD4 count for this to likely be dimorphic fungus. I suppose NON TB Myco is possible but not likely  --check RPR with prozone check  cbc, cmp, esr,crp ana, rf, ssa,ssb, ck, ldh, further fuo labs including cmv and ebv serologies, cryoglobulins, hep panel, blood cultures  --fu biopsy result --dc his ARVs concern for isentress induced rash --start prednisone 19m daily x one week  rtc overbook next week  I spent greater than 40  minutes with the patient including greater than 50% of time in face to face counsel of the patient and in coordination of their care.  HIV: highly R virus. Key question is IF this is Isentress induced rash, would he be at risk for ADR with another drug in this class of agents such as TIvicay?  IF so then would instead put him on BID boosted pRezista, with Truvada once daily and bid AZT  DM: followed closely in IM clinic, he will need to uptitrate  His inusling while on steroids, he did not start new drug so I removed from his list t avoid further confusion   Description:  Informed consent was obtained the patient after thorough counseling regarding this and benefits of the perceived urine reasons for proceeding with punch biopsies were presented and is present in the paper chart.  2 areas on his left chest were prepped in the usual sterile fashion. 1% lidocaine was infiltrated in both sides to effect  anesthesia. Punch biopsy was obtained from left inferior chest and sent for pathological examination other punch biopsies obtained from left superior chest and sent for culture for bacteria nontuberculous mycobacteria TB and fungi  Estimated blood loss minimal complications none

## 2013-09-11 ENCOUNTER — Ambulatory Visit: Payer: Medicare Other | Admitting: *Deleted

## 2013-09-11 ENCOUNTER — Telehealth: Payer: Self-pay | Admitting: Licensed Clinical Social Worker

## 2013-09-11 DIAGNOSIS — A539 Syphilis, unspecified: Secondary | ICD-10-CM

## 2013-09-11 LAB — EPSTEIN-BARR VIRUS VCA ANTIBODY PANEL
EBV EA IgG: <5 U/mL
EBV NA IgG: 535 U/mL — ABNORMAL HIGH
EBV VCA IgG: 692 U/mL — ABNORMAL HIGH
EBV VCA IgM: <10 U/mL

## 2013-09-11 LAB — SJOGRENS SYNDROME-A EXTRACTABLE NUCLEAR ANTIBODY: SSA (Ro) (ENA) Antibody, IgG: <1

## 2013-09-11 LAB — CBC WITH DIFFERENTIAL/PLATELET
Basophils Absolute: 0 10*3/uL (ref 0.0–0.1)
Basophils Relative: 1 % (ref 0–1)
Eosinophils Absolute: 0.1 10*3/uL (ref 0.0–0.7)
Eosinophils Relative: 2 % (ref 0–5)
HCT: 31.4 % — ABNORMAL LOW (ref 39.0–52.0)
Hemoglobin: 10.2 g/dL — ABNORMAL LOW (ref 13.0–17.0)
Lymphocytes Relative: 41 % (ref 12–46)
Lymphs Abs: 1.7 10*3/uL (ref 0.7–4.0)
MCH: 33.3 pg (ref 26.0–34.0)
MCHC: 32.5 g/dL (ref 30.0–36.0)
MCV: 102.6 fL — ABNORMAL HIGH (ref 78.0–100.0)
Monocytes Absolute: 0.4 10*3/uL (ref 0.1–1.0)
Monocytes Relative: 10 % (ref 3–12)
Neutro Abs: 1.9 10*3/uL (ref 1.7–7.7)
Neutrophils Relative %: 46 % (ref 43–77)
Platelets: 322 10*3/uL (ref 150–400)
RBC: 3.06 MIL/uL — ABNORMAL LOW (ref 4.22–5.81)
RDW: 17.4 % — ABNORMAL HIGH (ref 11.5–15.5)
WBC: 4.1 10*3/uL (ref 4.0–10.5)

## 2013-09-11 LAB — T.PALLIDUM AB, TOTAL: T PALLIDUM ANTIBODIES (TP-PA): 1.52 {s_co_ratio} — AB (ref <0.90)

## 2013-09-11 LAB — COMPLETE METABOLIC PANEL WITH GFR
ALT: 120 U/L — ABNORMAL HIGH (ref 0–53)
AST: 109 U/L — ABNORMAL HIGH (ref 0–37)
Albumin: 3.6 g/dL (ref 3.5–5.2)
Alkaline Phosphatase: 185 U/L — ABNORMAL HIGH (ref 39–117)
BUN: 8 mg/dL (ref 6–23)
CALCIUM: 9 mg/dL (ref 8.4–10.5)
CHLORIDE: 96 meq/L (ref 96–112)
CO2: 22 meq/L (ref 19–32)
CREATININE: 0.81 mg/dL (ref 0.50–1.35)
GFR, Est Non African American: >89 mL/min
GLUCOSE: 174 mg/dL — AB (ref 70–99)
Potassium: 4.7 mEq/L (ref 3.5–5.3)
Sodium: 129 mEq/L — ABNORMAL LOW (ref 135–145)
TOTAL PROTEIN: 7.9 g/dL (ref 6.0–8.3)
Total Bilirubin: 0.6 mg/dL (ref 0.2–1.2)

## 2013-09-11 LAB — HEPATITIS PANEL, ACUTE
HCV Ab: NEGATIVE
Hep A IgM: NONREACTIVE
Hep B C IgM: NONREACTIVE
Hepatitis B Surface Ag: NEGATIVE

## 2013-09-11 LAB — ANTI-NUCLEAR AB-TITER (ANA TITER)

## 2013-09-11 LAB — FERRITIN: Ferritin: 34 ng/mL (ref 22–322)

## 2013-09-11 LAB — SJOGRENS SYNDROME-B EXTRACTABLE NUCLEAR ANTIBODY: SSB (La) (ENA) Antibody, IgG: <1

## 2013-09-11 LAB — LACTATE DEHYDROGENASE: LDH: 204 U/L (ref 94–250)

## 2013-09-11 LAB — CYTOMEGALOVIRUS ANTIBODY, IGG: Cytomegalovirus Ab-IgG: >10 U/mL — ABNORMAL HIGH

## 2013-09-11 LAB — CMV IGM: CMV IgM: 10.3 AU/mL (ref <30.00)

## 2013-09-11 LAB — ANGIOTENSIN CONVERTING ENZYME: ANGIOTENSIN-CONVERTING ENZYME: 5 U/L — AB (ref 8–52)

## 2013-09-11 LAB — ANA: Anti Nuclear Antibody(ANA): POSITIVE — AB

## 2013-09-11 MED ORDER — PENICILLIN G BENZATHINE 1200000 UNIT/2ML IM SUSP
1.2000 10*6.[IU] | Freq: Once | INTRAMUSCULAR | Status: AC
  Administered 2013-09-11: 1.2 10*6.[IU] via INTRAMUSCULAR

## 2013-09-11 NOTE — Telephone Encounter (Signed)
Called patient and asked him to call us, patient has syphilis and needs to be treated. See note below:  Nathan [name] , Nathan [name] has pretty BAD syphilis. YeT again when the RPR is done without the prozone effect it gets missed as it didi in IM clinic. He needs to come in for IM Penicillin today and weekly x 3. He can stop his prednisone and restart his antivirals

## 2013-09-11 NOTE — Telephone Encounter (Signed)
Patient called back, will come in today for the first of 3 shots. Landis Gandy, RN

## 2013-09-13 LAB — WOUND CULTURE
GRAM STAIN: NONE SEEN
Gram Stain: NONE SEEN
Gram Stain: NONE SEEN
ORGANISM ID, BACTERIA: NO GROWTH

## 2013-09-15 ENCOUNTER — Telehealth: Payer: Self-pay | Admitting: *Deleted

## 2013-09-15 LAB — RPR: RPR Ser Ql: REACTIVE — AB

## 2013-09-15 LAB — CRYOGLOBULIN

## 2013-09-15 LAB — RPR TITER: RPR Titer: 1:512 {titer} — AB

## 2013-09-15 NOTE — Telephone Encounter (Signed)
Repeat RPR +, titer is 1:512.  RN noted that these are the same results reported Friday, patient has already started treatment. Landis Gandy, RN

## 2013-09-16 LAB — CULTURE, BLOOD (SINGLE)
Organism ID, Bacteria: NO GROWTH
Organism ID, Bacteria: NO GROWTH

## 2013-09-18 ENCOUNTER — Ambulatory Visit (INDEPENDENT_AMBULATORY_CARE_PROVIDER_SITE_OTHER): Payer: Medicare Other | Admitting: *Deleted

## 2013-09-18 DIAGNOSIS — A539 Syphilis, unspecified: Secondary | ICD-10-CM

## 2013-09-18 MED ORDER — PENICILLIN G BENZATHINE 1200000 UNIT/2ML IM SUSP
1.2000 10*6.[IU] | Freq: Once | INTRAMUSCULAR | Status: AC
  Administered 2013-09-18: 1.2 10*6.[IU] via INTRAMUSCULAR

## 2013-09-22 ENCOUNTER — Other Ambulatory Visit: Payer: Self-pay | Admitting: *Deleted

## 2013-09-22 DIAGNOSIS — E1165 Type 2 diabetes mellitus with hyperglycemia: Principal | ICD-10-CM

## 2013-09-22 DIAGNOSIS — IMO0001 Reserved for inherently not codable concepts without codable children: Secondary | ICD-10-CM

## 2013-09-22 MED ORDER — GLUCOSE BLOOD VI STRP: ORAL_STRIP | Status: DC

## 2013-09-24 ENCOUNTER — Ambulatory Visit (INDEPENDENT_AMBULATORY_CARE_PROVIDER_SITE_OTHER): Payer: Medicare Other | Admitting: Infectious Disease

## 2013-09-24 ENCOUNTER — Encounter: Payer: Self-pay | Admitting: Infectious Disease

## 2013-09-24 VITALS — BP 126/78 | HR 82 | Temp 98.1°F | Ht 73.0 in | Wt 166.0 lb

## 2013-09-24 DIAGNOSIS — K759 Inflammatory liver disease, unspecified: Secondary | ICD-10-CM

## 2013-09-24 DIAGNOSIS — E1159 Type 2 diabetes mellitus with other circulatory complications: Secondary | ICD-10-CM

## 2013-09-24 DIAGNOSIS — E785 Hyperlipidemia, unspecified: Secondary | ICD-10-CM | POA: Diagnosis not present

## 2013-09-24 DIAGNOSIS — G63 Polyneuropathy in diseases classified elsewhere: Secondary | ICD-10-CM

## 2013-09-24 DIAGNOSIS — R52 Pain, unspecified: Secondary | ICD-10-CM

## 2013-09-24 DIAGNOSIS — Z113 Encounter for screening for infections with a predominantly sexual mode of transmission: Secondary | ICD-10-CM

## 2013-09-24 DIAGNOSIS — A539 Syphilis, unspecified: Secondary | ICD-10-CM | POA: Diagnosis not present

## 2013-09-24 DIAGNOSIS — Z79899 Other long term (current) drug therapy: Secondary | ICD-10-CM | POA: Diagnosis not present

## 2013-09-24 DIAGNOSIS — B20 Human immunodeficiency virus [HIV] disease: Secondary | ICD-10-CM | POA: Diagnosis not present

## 2013-09-24 DIAGNOSIS — G547 Phantom limb syndrome without pain: Secondary | ICD-10-CM | POA: Diagnosis not present

## 2013-09-24 DIAGNOSIS — G909 Disorder of the autonomic nervous system, unspecified: Secondary | ICD-10-CM

## 2013-09-24 MED ORDER — PENICILLIN G BENZATHINE 1200000 UNIT/2ML IM SUSP
1.2000 10*6.[IU] | Freq: Once | INTRAMUSCULAR | Status: AC
  Administered 2013-09-24: 1.2 10*6.[IU] via INTRAMUSCULAR

## 2013-09-24 MED ORDER — OXYCODONE HCL 10 MG PO TABS: 10.0000 mg | ORAL_TABLET | Freq: Three times a day (TID) | ORAL | Status: DC | PRN

## 2013-09-24 NOTE — Progress Notes (Signed)
Subjective:    Patient ID: Nathan [name], male    DOB: 1950-11-05, 63 y.o.   MRN: [id number]  HPI  OMARRION [name] is a 63 y.o. male whose HIV has been perfectly suppressed with salvage regimen of  twice daily combivir, isentress and once daily viread with undetectable viral load and health cd4 count. (he has EXTENSIVE R with R to all NNRTI, R to nearly all PI except DRV, with still activity from TNF, AZT and the isentress he is currently on.  I saw him this winter when he had FUO and admitted him to [facility] and he had workup that did not reveal a clear cut cause. The IM Teaching service who thought fevers might have been due to statin, which was stopped. Fevers abated but I did not believe they were ever due to statin and he restarted with no problems of fevers or myalgias whatsoever.   However since then his fevers HAD returned and have been quite high. They persisted after he stopped his statin.  I was made aware of the fevers but was under the impression via communication with my staff that he did not wish to be seen urgently. He then developed a rash that started on legs and spread over his entire body sparing hands and feet and without mucosal involvement. He saw Dr. Eyvonne Mechanic and Dr. Eppie Gibson and RPR was checked (but without checking for prozone effect( and QF gold  HIs rash has worsened.  I saw him in clinic and performed punch biopsies x 2 sites. Repeat RPR CHECKED FOR PROZONE showed RPR 1:512 and treponemes were seen on biopsy by pathology.  He has received 2 IM PCN and today here for 3rd dose.'  I stopped prednisone I had prescribed him and he is taking his ARV regimen faithfully.  His chronic neuropathic pain and his phantom limb pain still bother him and at times he has to take 2 pills of oxycodone. He requested higher dose. I told him I was willing to go up by one dose but that any further escalation would need to involve change to MS contin and in that scenario would also want him going  to pain clinic ulitmately.     Review of Systems  Constitutional: Negative for chills, diaphoresis, activity change, appetite change and unexpected weight change.  HENT: Negative for sinus pressure, sneezing and trouble swallowing.   Eyes: Negative for photophobia and visual disturbance.  Respiratory: Negative for chest tightness and stridor.   Cardiovascular: Negative for palpitations and leg swelling.  Gastrointestinal: Negative for constipation, blood in stool, abdominal distention and anal bleeding.  Genitourinary: Negative for dysuria, hematuria, flank pain and difficulty urinating.  Musculoskeletal: Negative for arthralgias, back pain, gait problem, joint swelling and myalgias.  Skin: Negative for color change, pallor and wound.  Neurological: Negative for dizziness, tremors, weakness and light-headedness.  Hematological: Negative for adenopathy. Does not bruise/bleed easily.  Psychiatric/Behavioral: Negative for behavioral problems, confusion, sleep disturbance, dysphoric mood, decreased concentration and agitation.       Objective:   Physical Exam  Nursing note and vitals reviewed. Constitutional: He is oriented to person, place, and time. No distress.  Fatigued appearing  HENT:  Head: Normocephalic and atraumatic.  Mouth/Throat: Oropharynx is clear and moist. No oropharyngeal exudate.  Eyes: Conjunctivae and EOM are normal. No scleral icterus.  Neck: Normal range of motion. Neck supple. No JVD present.  Cardiovascular: Normal rate and regular rhythm.   Pulmonary/Chest: Effort normal. No respiratory distress. He has no wheezes.  Abdominal: Soft. Bowel sounds are normal. He exhibits no distension.  Musculoskeletal: He exhibits no edema and no tenderness.  Lymphadenopathy:    He has no cervical adenopathy.  Neurological: He is alert and oriented to person, place, and time.  Skin: Skin is warm and dry. Rash noted. He is not diaphoretic. There is erythema. No pallor.   Psychiatric: His behavior is normal. Judgment and thought content normal. His mood appears anxious.   Skin:   Rash improving and biopsy sites are clean.        Assessment & Plan:   Disseminated syphilis with hepatitis  --finish 3rd dose --recheck titers in 6 months  I spent greater than 25 minutes with the patient including greater than 50% of time in face to face counsel of the patient and in coordination of their care.   HIV: highly R virus.  --continue current regimen  DM: followed closely in IM clinic   Neuropathic pain and phantom limb pain: increase to 10mg  oxycodone tid prn. (see above discussion)  Hepatitis: likely due to syphilis  I would like him to have LFTs rechecked by early June

## 2013-09-24 NOTE — Addendum Note (Signed)
Addended by: Reggy Eye on: 09/24/2013 05:11 PM   Modules accepted: Orders

## 2013-10-09 ENCOUNTER — Ambulatory Visit (INDEPENDENT_AMBULATORY_CARE_PROVIDER_SITE_OTHER): Payer: Medicare Other | Admitting: Internal Medicine

## 2013-10-09 ENCOUNTER — Encounter: Payer: Self-pay | Admitting: Internal Medicine

## 2013-10-09 VITALS — BP 142/67 | HR 81 | Temp 97.0°F | Ht 73.0 in | Wt 169.7 lb

## 2013-10-09 DIAGNOSIS — A539 Syphilis, unspecified: Secondary | ICD-10-CM | POA: Diagnosis not present

## 2013-10-09 DIAGNOSIS — I10 Essential (primary) hypertension: Secondary | ICD-10-CM

## 2013-10-09 DIAGNOSIS — E785 Hyperlipidemia, unspecified: Secondary | ICD-10-CM | POA: Diagnosis not present

## 2013-10-09 DIAGNOSIS — IMO0002 Reserved for concepts with insufficient information to code with codable children: Secondary | ICD-10-CM

## 2013-10-09 DIAGNOSIS — R509 Fever, unspecified: Secondary | ICD-10-CM | POA: Diagnosis not present

## 2013-10-09 DIAGNOSIS — G547 Phantom limb syndrome without pain: Secondary | ICD-10-CM | POA: Diagnosis not present

## 2013-10-09 DIAGNOSIS — E1149 Type 2 diabetes mellitus with other diabetic neurological complication: Secondary | ICD-10-CM

## 2013-10-09 DIAGNOSIS — S78119A Complete traumatic amputation at level between unspecified hip and knee, initial encounter: Secondary | ICD-10-CM

## 2013-10-09 DIAGNOSIS — B20 Human immunodeficiency virus [HIV] disease: Secondary | ICD-10-CM

## 2013-10-09 DIAGNOSIS — Z8619 Personal history of other infectious and parasitic diseases: Secondary | ICD-10-CM | POA: Insufficient documentation

## 2013-10-09 DIAGNOSIS — Z0000 Encounter for general adult medical examination without abnormal findings: Secondary | ICD-10-CM

## 2013-10-09 LAB — COMPLETE METABOLIC PANEL WITH GFR
ALBUMIN: 4 g/dL (ref 3.5–5.2)
ALT: 36 U/L (ref 0–53)
AST: 35 U/L (ref 0–37)
Alkaline Phosphatase: 61 U/L (ref 39–117)
BUN: 7 mg/dL (ref 6–23)
CALCIUM: 8.9 mg/dL (ref 8.4–10.5)
CO2: 21 meq/L (ref 19–32)
CREATININE: 0.77 mg/dL (ref 0.50–1.35)
Chloride: 103 mEq/L (ref 96–112)
GFR, Est Non African American: >89 mL/min
GLUCOSE: 237 mg/dL — AB (ref 70–99)
POTASSIUM: 4 meq/L (ref 3.5–5.3)
Sodium: 136 mEq/L (ref 135–145)
Total Bilirubin: 0.6 mg/dL (ref 0.2–1.2)
Total Protein: 7.1 g/dL (ref 6.0–8.3)

## 2013-10-09 LAB — CBC WITH DIFFERENTIAL/PLATELET
BASOS ABS: 0 10*3/uL (ref 0.0–0.1)
Basophils Relative: 0 % (ref 0–1)
EOS PCT: 2 % (ref 0–5)
Eosinophils Absolute: 0.1 10*3/uL (ref 0.0–0.7)
HCT: 28.2 % — ABNORMAL LOW (ref 39.0–52.0)
Hemoglobin: 9.8 g/dL — ABNORMAL LOW (ref 13.0–17.0)
Lymphocytes Relative: 59 % — ABNORMAL HIGH (ref 12–46)
Lymphs Abs: 3 10*3/uL (ref 0.7–4.0)
MCH: 34.4 pg — ABNORMAL HIGH (ref 26.0–34.0)
MCHC: 34.8 g/dL (ref 30.0–36.0)
MCV: 98.9 fL (ref 78.0–100.0)
Monocytes Absolute: 0.4 10*3/uL (ref 0.1–1.0)
Monocytes Relative: 8 % (ref 3–12)
NEUTROS ABS: 1.6 10*3/uL — AB (ref 1.7–7.7)
Neutrophils Relative %: 31 % — ABNORMAL LOW (ref 43–77)
PLATELETS: 256 10*3/uL (ref 150–400)
RBC: 2.85 MIL/uL — ABNORMAL LOW (ref 4.22–5.81)
RDW: 16.3 % — AB (ref 11.5–15.5)
WBC: 5.1 10*3/uL (ref 4.0–10.5)

## 2013-10-09 LAB — POCT GLYCOSYLATED HEMOGLOBIN (HGB A1C): HEMOGLOBIN A1C: 7.5

## 2013-10-09 LAB — FUNGUS CULTURE W SMEAR: Smear Result: NONE SEEN

## 2013-10-09 LAB — GLUCOSE, CAPILLARY: Glucose-Capillary: 231 mg/dL — ABNORMAL HIGH (ref 70–99)

## 2013-10-09 NOTE — Assessment & Plan Note (Signed)
Lipid Panel     Component Value Date/Time   CHOL 138 03/04/2013 0927   TRIG 177* 03/04/2013 0927   HDL 30* 03/04/2013 0927   CHOLHDL 4.6 03/04/2013 0927   VLDL 35 03/04/2013 0927   LDLCALC 73 03/04/2013 0927   Restarted Lipitor  40 mg qhs

## 2013-10-09 NOTE — Assessment & Plan Note (Signed)
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 Tommy Medal

## 2013-10-09 NOTE — Patient Instructions (Addendum)
General Instructions: Please follow up in 3 months, sooner if needed We will check you liver enzymes today and blood counts  Take care Good job with your Hemoglobin A1C    Treatment Goals:  Goals (1 Years of Data) as of 10/09/13         As of Today 09/24/13 09/10/13 08/28/13 08/04/13     Blood Pressure    . Blood Pressure < 140/90  142/67 126/78 146/73 95/58 131/68     Lifestyle    . Prevent Falls           Result Component    . HDL > 40          . HEMOGLOBIN A1C < 7.0  7.5        . LDL CALC < 100          . LDL CALC < 100            Progress Toward Treatment Goals:  Treatment Goal 10/09/2013  Hemoglobin A1C unable to assess  Blood pressure at goal  Prevent falls at goal    Self Care Goals & Plans:  Self Care Goal 10/09/2013  Manage my medications take my medicines as prescribed; bring my medications to every visit; refill my medications on time; follow the sick day instructions if I am sick  Monitor my health keep track of my blood glucose; bring my glucose meter and log to each visit; keep track of my blood pressure; bring my blood pressure log to each visit; keep track of my weight; check my feet daily  Eat healthy foods drink diet soda or water instead of juice or soda; eat more vegetables; eat foods that are low in salt; eat baked foods instead of fried foods; eat fruit for snacks and desserts  Be physically active find an activity I enjoy  Prevent falls -  Meeting treatment goals maintain the current self-care plan    Home Blood Glucose Monitoring 10/09/2013  Check my blood sugar 3 times a day  When to check my blood sugar before meals     Care Management & Community Referrals:  Referral 10/09/2013  Referrals made for care management support none needed  Referrals made to community resources none       Hypertension As your heart beats, it forces blood through your arteries. This force is your blood pressure. If the pressure is too high, it is called hypertension  (HTN) or high blood pressure. HTN is dangerous because you may have it and not know it. High blood pressure may mean that your heart has to work harder to pump blood. Your arteries may be narrow or stiff. The extra work puts you at risk for heart disease, stroke, and other problems.  Blood pressure consists of two numbers, a higher number over a lower, 110/72, for example. It is stated as "110 over 72." The ideal is below 120 for the top number (systolic) and under 80 for the bottom (diastolic). Write down your blood pressure today. You should pay close attention to your blood pressure if you have certain conditions such as:  Heart failure.  Prior heart attack.  Diabetes  Chronic kidney disease.  Prior stroke.  Multiple risk factors for heart disease. To see if you have HTN, your blood pressure should be measured while you are seated with your arm held at the level of the heart. It should be measured at least twice. A one-time elevated blood pressure reading (especially in the Emergency Department) does  not mean that you need treatment. There may be conditions in which the blood pressure is different between your right and left arms. It is important to see your caregiver soon for a recheck. Most people have essential hypertension which means that there is not a specific cause. This type of high blood pressure may be lowered by changing lifestyle factors such as:  Stress.  Smoking.  Lack of exercise.  Excessive weight.  Drug/tobacco/alcohol use.  Eating less salt. Most people do not have symptoms from high blood pressure until it has caused damage to the body. Effective treatment can often prevent, delay or reduce that damage. TREATMENT  When a cause has been identified, treatment for high blood pressure is directed at the cause. There are a large number of medications to treat HTN. These fall into several categories, and your caregiver will help you select the medicines that are best  for you. Medications may have side effects. You should review side effects with your caregiver. If your blood pressure stays high after you have made lifestyle changes or started on medicines,   Your medication(s) may need to be changed.  Other problems may need to be addressed.  Be certain you understand your prescriptions, and know how and when to take your medicine.  Be sure to follow up with your caregiver within the time frame advised (usually within two weeks) to have your blood pressure rechecked and to review your medications.  If you are taking more than one medicine to lower your blood pressure, make sure you know how and at what times they should be taken. Taking two medicines at the same time can result in blood pressure that is too low. SEEK IMMEDIATE MEDICAL CARE IF:  You develop a severe headache, blurred or changing vision, or confusion.  You have unusual weakness or numbness, or a faint feeling.  You have severe chest or abdominal pain, vomiting, or breathing problems. MAKE SURE YOU:   Understand these instructions.  Will watch your condition.  Will get help right away if you are not doing well or get worse. Document Released: 04/24/2005 Document Revised: 07/17/2011 Document Reviewed: 12/13/2007 [facility] Patient Information 2014 [place].  Diabetes and Exercise Exercising regularly is important. It is not just about losing weight. It has many health benefits, such as:  Improving your overall fitness, flexibility, and endurance.  Increasing your bone density.  Helping with weight control.  Decreasing your body fat.  Increasing your muscle strength.  Reducing stress and tension.  Improving your overall health. People with diabetes who exercise gain additional benefits because exercise:  Reduces appetite.  Improves the body's use of blood sugar (glucose).  Helps lower or control blood glucose.  Decreases blood pressure.  Helps control blood  lipids (such as cholesterol and triglycerides).  Improves the body's use of the hormone insulin by:  Increasing the body's insulin sensitivity.  Reducing the body's insulin needs.  Decreases the risk for heart disease because exercising:  Lowers cholesterol and triglycerides levels.  Increases the levels of good cholesterol (such as high-density lipoproteins [HDL]) in the body.  Lowers blood glucose levels. YOUR ACTIVITY PLAN  Choose an activity that you enjoy and set realistic goals. Your health care provider or diabetes educator can help you make an activity plan that works for you. You can break activities into 2 or 3 sessions throughout the day. Doing so is as good as one long session. Exercise ideas include:  Taking the dog for a walk.  Taking  the stairs instead of the elevator.  Dancing to your favorite song.  Doing your favorite exercise with a friend. RECOMMENDATIONS FOR EXERCISING WITH TYPE 1 OR TYPE 2 DIABETES   Check your blood glucose before exercising. If blood glucose levels are greater than 240 mg/dL, check for urine ketones. Do not exercise if ketones are present.  Avoid injecting insulin into areas of the body that are going to be exercised. For example, avoid injecting insulin into:  The arms when playing tennis.  The legs when jogging.  Keep a record of:  Food intake before and after you exercise.  Expected peak times of insulin action.  Blood glucose levels before and after you exercise.  The type and amount of exercise you have done.  Review your records with your health care provider. Your health care provider will help you to develop guidelines for adjusting food intake and insulin amounts before and after exercising.  If you take insulin or oral hypoglycemic agents, watch for signs and symptoms of hypoglycemia. They include:  Dizziness.  Shaking.  Sweating.  Chills.  Confusion.  Drink plenty of water while you exercise to prevent  dehydration or heat stroke. Body water is lost during exercise and must be replaced.  Talk to your health care provider before starting an exercise program to make sure it is safe for you. Remember, almost any type of activity is better than none. Document Released: 07/15/2003 Document Revised: 12/25/2012 Document Reviewed: 10/01/2012 [facility] Patient Information 2014 [place].  Type 2 Diabetes Mellitus, Adult Type 2 diabetes mellitus is a long-term (chronic) disease. In type 2 diabetes:  The pancreas does not make enough of a hormone called insulin.  The cells in the body do not respond as well to the insulin that is made.  Both of the above can happen. Normally, insulin moves sugars from food into tissue cells. This gives you energy. If you have type 2 diabetes, sugars cannot be moved into tissue cells. This causes high blood sugar (hyperglycemia).  HOME CARE  Have your hemoglobin A1c level checked twice a year. The level shows if your diabetes is under control or out of control.  Perform daily blood sugar testing as told by your doctor.  Check your ketone levels by testing your pee (urine) when you are sick and as told.  Take your diabetes or insulin medicine as told by your doctor.  Never run out of insulin.  Adjust how much insulin you give yourself based on how many carbs (carbohydrates) you eat. Carbs are in many foods, such as fruits, vegetables, whole grains, and dairy products.  Have a healthy snack between every healthy meal. Have 3 meals and 3 snacks a day.  Lose weight if you are overweight.  Carry a medical alert card or wear your medical alert jewelry.  Carry a 15 gram carb snack with you at all times. Examples include:  Glucose pills, 3 or 4.  Glucose gel, 15 gram tube.  Raisins, 2 tablespoons (24 grams).  Jelly beans, 6.  Animal crackers, 8.  Sugar pop, 4 ounces (120 milliliters).  Gummy treats, 9.  Notice low blood sugar (hypoglycemia)  symptoms, such as:  Shaking (tremors).  Decreased ability to think clearly.  Sweating.  Increased heart rate.  Headache.  Dry mouth.  Hunger.  Crabbiness (irritability).  Being worried or tense (anxiety).  Restless sleep.  A change in speech or coordination.  Confusion.  Treat low blood sugar right away. If you are alert and can swallow,  follow the 15:15 rule:  Take 15 20 grams of a rapid-acting glucose or carb. This includes glucose gel, glucose pills, or 4 ounces (120 milliliters) of fruit juice, regular pop, or low-fat milk.  Check your blood sugar level after taking the glucose.  Take 15 20 grams of more glucose if the repeat blood sugar level is still 70 mg/dL (milligrams/deciliter) or below.  Eat a meal or snack within 1 hour of the blood sugar levels going back to normal.  Notice early symptoms of high blood sugar, such as:  Being really thirsty or drinking a lot (polydipsia).  Peeing (urinating) a lot (polyuria).  Do at least 150 minutes of physical activity a week or as told.  Split the 150 minutes of activity up during the week. Do not do 150 minutes of activity in one day.  Perform exercises, such as weight lifting, at least 2 times a week or as told.  Adjust your insulin or food intake as needed if you start a new exercise or sport.  Follow your sick day plan when you are not able to eat or drink as usual.  Avoid tobacco use.  Women who are not pregnant should drink no more than 1 drink a day. Men should drink no more than 2 drinks a day.  Only drink alcohol with food.  Ask your doctor if alcohol is safe for you.  Tell your doctor if you drink alcohol several times during the week.  See your doctor regularly.  Schedule an eye exam soon after you are diagnosed with diabetes. Schedule exams once every year.  Check your skin and feet every day. Check for cuts, bruises, redness, nail problems, bleeding, blisters, or sores. A doctor should do a  foot exam once a year.  Brush your teeth and gums twice a day. Floss once a day. Visit your dentist regularly.  Share your diabetes plan with your workplace or school.  Stay up-to-date with shots that fight against diseases (immunizations).  Learn how to manage stress.  Get diabetes education and support as needed.  Ask your doctor for special help if:  You need help to maintain or improve how you to do things on your own.  You need help to maintain or improve the quality of your life.  You have foot or hand problems.  You have trouble cleaning yourself, dressing, eating, or doing physical activity. GET HELP RIGHT AWAY IF:  You have trouble breathing.  You have moderate to large ketone levels.  You are unable to eat food or drink fluids for more than 6 hours.  You feel sick to your stomach (nauseous) or throw up (vomit) for more than 6 hours.  Your blood sugar level is over 240 mg/dL.  There is a change in mental status.  You get another serious illness.  You have watery poop (diarrhea) for more than 6 hours.  You have been sick or have had a fever for 2 or more days and are not getting better.  You have pain when you are physically active. MAKE SURE YOU:  Understand these instructions.  Will watch your condition.  Will get help right away if you are not doing well or get worse. Document Released: 02/01/2008 Document Revised: 02/12/2013 Document Reviewed: 08/23/2012 [facility] Patient Information 2014 [place], [place].

## 2013-10-09 NOTE — Assessment & Plan Note (Addendum)
08/28/2013 Office Visit Edited 08/28/2013 6:38 PM by Carter Kitten, MD   Unclear etiology.  Differential is very broad and include drug eruptions, tertiary gummatous syphilis, cutaneous tuberculosis vs underlying disseminated TB manifesting as cutaneous TB vs pneumocystis cutaneous manifestations etc.  Patient had extensive work up in December 2014 including pan CT, PET scan and several other studies without an obvious explanation for fever. At the time of discharge, his fevers were thought to be secondary to statin as fever subsided after stopping statin.  Lack of appetite, weight loss, late afternoon to evening fevers concerning for TB.  History of syphilis in the past makes syphilitic rash as a possibility as well.  Discussed the case with Dr. Eppie Gibson regarding further management.  Plans  Check CXR, Quantiferon Gold assay to rule out TB.  Check CBC, CMP  Check RPR.  Follow up with ID in a week or so.  Follow up as needed or if symptoms worsen.      10/09/13 office visit Fever resolved H/o recent +RPR (+skin biopsy and titer 1:512 RPR and T.pallidium 1.52) and being tx'ed with PCN x 3 weeks through [facility] likely cause of fever.  Also ANA + speckled 1:40, SSA/B neg, EBV IgG elevated, CMV elevated, HepB surface Ag neg and HepB core neg, HCV Ab negative, TB negative, AFB cultures pending, wound, blood and fungal cultures NTD Will check CMET, CBC today

## 2013-10-09 NOTE — Assessment & Plan Note (Deleted)
S/p AKA right lower leg Getting Oxycodone 10 mg tid prn from Dr. Tommy Medal

## 2013-10-09 NOTE — Assessment & Plan Note (Addendum)
BP Readings from Last 3 Encounters:  10/09/13 142/67  09/24/13 126/78  09/10/13 146/73    Lab Results  Component Value Date   NA 129* 09/10/2013   K 4.7 09/10/2013   CREATININE 0.81 09/10/2013    Assessment: Blood pressure control: mildly elevated Progress toward BP goal:  at goal Comments: none  Plan: Medications:  continue current medications (Lisinopril 40 mg qd) Educational resources provided: brochure Self management tools provided: other (see comments) Other plans: f/u in 3 months, check CMET today

## 2013-10-09 NOTE — Assessment & Plan Note (Addendum)
+   titers 09/10/13  ID thought he had associated syphillitis hepatitis.  ANA + likely associated with +RPR, HepBsAg neg, HepB core Ag neg, HCV negative, TB negative, so far blood cultures negative to date.  Being treated by [facility] with Penicillin x 3 weeks finished tx 2-3 weeks ago  Will check CMET, CBC today  Pt to follow with [facility] 03/2014

## 2013-10-09 NOTE — Assessment & Plan Note (Addendum)
Lab Results  Component Value Date   HGBA1C 7.5 10/09/2013   HGBA1C 8.7 07/10/2013   HGBA1C 7.7* 04/21/2013     Assessment: Diabetes control: fair control (pending HA1C) Progress toward A1C goal:  unable to assess Comments: cbg running 100s to 300s but HA1C improved. Goal <7%  Plan: Medications:  continue current medications Lantus 67 units, Humalog 14, 24, 24. Will hold Byetta at this time as pt has had improvement w/o starting  Home glucose monitoring: Frequency: 3 times a day Timing: before meals Instruction/counseling given: provided printed educational material Educational resources provided: brochure Self management tools provided: copy of home glucose meter download;home glucose logbook;other (see comments) Other plans: f/u in 3 months

## 2013-10-09 NOTE — Assessment & Plan Note (Signed)
Controlled  Follows with [facility]-Dr. Tommy Medal Recent co-infection with +RPR

## 2013-10-09 NOTE — Assessment & Plan Note (Signed)
-  Due for Tdap and Zoster but insurance will not cover. Consider getting through [facility]  -eye exam due 07/2014

## 2013-10-09 NOTE — Assessment & Plan Note (Addendum)
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 Tommy Medal Slight skin breakdown with skin cracking right post AKA site. Pt to f/u with Vascular on 10/13/13

## 2013-10-09 NOTE — Progress Notes (Addendum)
   Subjective:    Patient ID: Nathan [name], male    DOB: 09-09-1950, 63 y.o.   MRN: [id number]  HPI Comments: 63 y.o PMH DM 2 associated with PVD (HA1C 8.5 07/10/13), controlled HIV (highly resistant) (cd4 632, viral load wnl 05/2013), dyslipidemia (LDL 73 02/2013), HTN (BP 142/67), h/o right AKA with prosthetic leg, h/o colon polyps, GERD, h/o Hep B core Ab +, h/o MRSA, h/o MS, h/o PVD, emphysema per last PET scan results, recent history of syphillis with h/o +RPR, phantom limb pain (gets narcotics from Dr. Tommy Medal), EF 04/2013 60-65%.     1) He presents for f/u DM.  Lantus 67 u qhs, 14, 24 and 24 units prandial. Will check HA1C today. Attempted to add Victoza in the past for better control but insurance will cover Byetta 2) H/o FUO found out recently not to be due to statin but to likely to be due to +RPR with titers 1:512 with noted rash 08/28/13.  He also had hepatitis likely associated to +RPR per ID notes.  He is being tx'ed with Penicillin x 3 weeks.  Will check CMET today  3) He thinks he has allergies b/c has nasal congestion at night and when he goes outside to [place] the lawn. He uses OTC medication ? Name with relief 4) He has soreness on bottom of right AKA site.  He states when he lost weight recently he had to wear a newer liner and 2 socks to support prosthetic which could have aggravated.  He has appt with Vascular surgery on 6/8  Health maintenance -Due for Tdap and Zoster but insurance will not cover. Consider getting through [facility]  -eye exam due 07/2014       Review of Systems  Constitutional: Negative for fever, appetite change, fatigue and unexpected weight change.       Denies body aches.  Pt has had increased appetite and weight gain since being tx'ed for syphillis   HENT: Positive for congestion.   Respiratory: Negative for shortness of breath.   Cardiovascular: Negative for chest pain.  Gastrointestinal: Negative for constipation.  Neurological: Negative for  headaches.       Objective:   Physical Exam  Nursing note and vitals reviewed. Constitutional: He is oriented to person, place, and time. Vital signs are normal. He appears well-developed and well-nourished. He is cooperative.  HENT:  Head: Normocephalic and atraumatic.  Mouth/Throat: Oropharynx is clear and moist and mucous membranes are normal. He has dentures. No oropharyngeal exudate.  Eyes: Conjunctivae are normal. Pupils are equal, round, and reactive to light. Right eye exhibits no discharge. Left eye exhibits no discharge. No scleral icterus.  Cardiovascular: Normal rate, regular rhythm, S1 normal, S2 normal and normal heart sounds.   No murmur heard. No lower ext edema   Pulmonary/Chest: Effort normal and breath sounds normal.  Abdominal: Soft. Bowel sounds are normal. He exhibits no distension. There is no tenderness.  Musculoskeletal: He exhibits no edema.  Neurological: He is alert and oriented to person, place, and time.  Walks with limp BL  Skin: Skin is warm and dry. No rash noted.     Psychiatric: He has a normal mood and affect. His speech is normal and behavior is normal. Judgment and thought content normal. Cognition and memory are normal.                   Assessment & Plan:  F/u in 3 months

## 2013-10-10 ENCOUNTER — Encounter: Payer: Self-pay | Admitting: Family

## 2013-10-10 ENCOUNTER — Encounter: Payer: Self-pay | Admitting: Internal Medicine

## 2013-10-10 NOTE — Progress Notes (Signed)
Case discussed with Dr. McLean soon after the resident saw the patient.  We reviewed the resident's history and exam and pertinent patient test results.  I agree with the assessment, diagnosis, and plan of care documented in the resident's note. 

## 2013-10-13 ENCOUNTER — Ambulatory Visit: Payer: Self-pay | Admitting: Surgery

## 2013-10-13 ENCOUNTER — Other Ambulatory Visit (HOSPITAL_COMMUNITY): Payer: Self-pay

## 2013-10-13 ENCOUNTER — Ambulatory Visit (INDEPENDENT_AMBULATORY_CARE_PROVIDER_SITE_OTHER): Payer: Medicare Other | Admitting: Family

## 2013-10-13 ENCOUNTER — Encounter: Payer: Self-pay | Admitting: Family

## 2013-10-13 ENCOUNTER — Encounter (HOSPITAL_COMMUNITY): Payer: Self-pay

## 2013-10-13 ENCOUNTER — Ambulatory Visit (INDEPENDENT_AMBULATORY_CARE_PROVIDER_SITE_OTHER)
Admission: RE | Admit: 2013-10-13 | Discharge: 2013-10-13 | Disposition: A | Discharge status: Home / Self Care | Payer: Medicare Other | Source: Ambulatory Visit | Attending: Surgery | Admitting: Surgery

## 2013-10-13 ENCOUNTER — Ambulatory Visit (HOSPITAL_COMMUNITY)
Admission: RE | Admit: 2013-10-13 | Discharge: 2013-10-13 | Disposition: A | Discharge status: Home / Self Care | Payer: Medicare Other | Source: Ambulatory Visit | Attending: Family | Admitting: Family

## 2013-10-13 ENCOUNTER — Ambulatory Visit: Payer: Self-pay | Admitting: Family

## 2013-10-13 VITALS — BP 124/68 | HR 73 | Resp 16 | Ht 73.0 in | Wt 168.0 lb

## 2013-10-13 DIAGNOSIS — Z48812 Encounter for surgical aftercare following surgery on the circulatory system: Secondary | ICD-10-CM

## 2013-10-13 DIAGNOSIS — I70219 Atherosclerosis of native arteries of extremities with intermittent claudication, unspecified extremity: Secondary | ICD-10-CM | POA: Diagnosis not present

## 2013-10-13 DIAGNOSIS — I739 Peripheral vascular disease, unspecified: Secondary | ICD-10-CM | POA: Diagnosis not present

## 2013-10-13 DIAGNOSIS — I6529 Occlusion and stenosis of unspecified carotid artery: Secondary | ICD-10-CM

## 2013-10-13 DIAGNOSIS — M79609 Pain in unspecified limb: Secondary | ICD-10-CM

## 2013-10-13 DIAGNOSIS — F1120 Opioid dependence, uncomplicated: Secondary | ICD-10-CM | POA: Insufficient documentation

## 2013-10-13 HISTORY — DX: Pain in unspecified limb: M79.609

## 2013-10-13 NOTE — Patient Instructions (Signed)
Peripheral Vascular Disease Peripheral Vascular Disease (PVD), also called Peripheral Arterial Disease (PAD), is a circulation problem caused by cholesterol (atherosclerotic plaque) deposits in the arteries. PVD commonly occurs in the lower extremities (legs) but it can occur in other areas of the body, such as your arms. The cholesterol buildup in the arteries reduces blood flow which can cause pain and other serious problems. The presence of PVD can place a person at risk for Coronary Artery Disease (CAD).  CAUSES  Causes of PVD can be many. It is usually associated with more than one risk factor such as:   High Cholesterol.  Smoking.  Diabetes.  Lack of exercise or inactivity.  High blood pressure (hypertension).  Obesity.  Family history. SYMPTOMS   When the lower extremities are affected, patients with PVD may experience:  Leg pain with exertion or physical activity. This is called INTERMITTENT CLAUDICATION. This may present as cramping or numbness with physical activity. The location of the pain is associated with the level of blockage. For example, blockage at the abdominal level (distal abdominal aorta) may result in buttock or hip pain. Lower leg arterial blockage may result in calf pain.  As PVD becomes more severe, pain can develop with less physical activity.  In people with severe PVD, leg pain may occur at rest.  Other PVD signs and symptoms:  Leg numbness or weakness.  Coldness in the affected leg or foot, especially when compared to the other leg.  A change in leg color.  Patients with significant PVD are more prone to ulcers or sores on toes, feet or legs. These may take longer to heal or may reoccur. The ulcers or sores can become infected.  If signs and symptoms of PVD are ignored, gangrene may occur. This can result in the loss of toes or loss of an entire limb.  Not all leg pain is related to PVD. Other medical conditions can cause leg pain such  as:  Blood clots (embolism) or Deep Vein Thrombosis.  Inflammation of the blood vessels (vasculitis).  Spinal stenosis. DIAGNOSIS  Diagnosis of PVD can involve several different types of tests. These can include:  Pulse Volume Recording Method (PVR). This test is simple, painless and does not involve the use of X-rays. PVR involves measuring and comparing the blood pressure in the arms and legs. An ABI (Ankle-Brachial Index) is calculated. The normal ratio of blood pressures is 1. As this number becomes smaller, it indicates more severe disease.  < 0.95  indicates significant narrowing in one or more leg vessels.  <0.8 there will usually be pain in the foot, leg or buttock with exercise.  <0.4 will usually have pain in the legs at rest.  <0.25  usually indicates limb threatening PVD.  Doppler detection of pulses in the legs. This test is painless and checks to see if you have a pulses in your legs/feet.  A dye or contrast material (a substance that highlights the blood vessels so they show up on x-ray) may be given to help your caregiver better see the arteries for the following tests. The dye is eliminated from your body by the kidney's. Your caregiver may order blood work to check your kidney function and other laboratory values before the following tests are performed:  Magnetic Resonance Angiography (MRA). An MRA is a picture study of the blood vessels and arteries. The MRA machine uses a large magnet to produce images of the blood vessels.  Computed Tomography Angiography (CTA). A CTA is a   specialized x-ray that looks at how the blood flows in your blood vessels. An IV may be inserted into your arm so contrast dye can be injected.  Angiogram. Is a procedure that uses x-rays to look at your blood vessels. This procedure is minimally invasive, meaning a small incision (cut) is made in your groin. A small tube (catheter) is then inserted into the artery of your groin. The catheter is  guided to the blood vessel or artery your caregiver wants to examine. Contrast dye is injected into the catheter. X-rays are then taken of the blood vessel or artery. After the images are obtained, the catheter is taken out. TREATMENT  Treatment of PVD involves many interventions which may include:  Lifestyle changes:  Quitting smoking.  Exercise.  Following a low fat, low cholesterol diet.  Control of diabetes.  Foot care is very important to the PVD patient. Good foot care can help prevent infection.  Medication:  Cholesterol-lowering medicine.  Blood pressure medicine.  Anti-platelet drugs.  Certain medicines may reduce symptoms of Intermittent Claudication.  Interventional/Surgical options:  Angioplasty. An Angioplasty is a procedure that inflates a balloon in the blocked artery. This opens the blocked artery to improve blood flow.  Stent Implant. A wire mesh tube (stent) is placed in the artery. The stent expands and stays in place, allowing the artery to remain open.  Peripheral Bypass Surgery. This is a surgical procedure that reroutes the blood around a blocked artery to help improve blood flow. This type of procedure may be performed if Angioplasty or stent implants are not an option. SEEK IMMEDIATE MEDICAL CARE IF:   You develop pain or numbness in your arms or legs.  Your arm or leg turns cold, becomes blue in color.  You develop redness, warmth, swelling and pain in your arms or legs. MAKE SURE YOU:   Understand these instructions.  Will watch your condition.  Will get help right away if you are not doing well or get worse. Document Released: 06/01/2004 Document Revised: 07/17/2011 Document Reviewed: 04/28/2008 [employer] Patient Information 2014 [employer].  

## 2013-10-13 NOTE — Progress Notes (Signed)
[facility] [place] HISTORY AND PHYSICAL -PAD  History of Present Illness Nathan [name] is a 63 y.o. male patient of Dr. Trula Slade. The patient comes back today for followup. He is status post angiography in August of 2013. He has a history of left leg stenting. A 7 x 40 Cordis stent was placed on February of 2010 for left leg claudication. He has single vessel runoff via the anterior tibial artery. Ultrasound identified a high-grade in-stent stenosis. His angiography revealed that the stent was widely patent. There was narrowing at the ostium of the anterior tibial artery. No intervention was performed. He has no complaints today. He is ambulating with a right above-knee prosthesis. He reports moderate pain at the bony prominence of the right AKA stump with walking only. Has stinging and burning in foot up to left knee, attributes to neuropathy. Has occassional left calf pain with walking various distances, denies non healing wounds. Pt denies any history of stroke or TIA. Last carotid Duplex on file was August, 2013: 40-59% bilateral ICA stenosis, right subclavian artery stenosis.   The patient denies New Medical or Surgical History.  Pt Diabetic: Yes, 7.5 A1C in the last few days, improved from over 8 per pt. Pt smoker: former smoker, quit in 2009  Pt meds include: Statin :Yes ASA: No Other anticoagulants/antiplatelets: Plavix  Past Medical History  Diagnosis Date  . Diabetes mellitus   . HIV infection     undetectable viral load and CD4 ct 667 as of 11/2011  . Colon polyps     noted previous colonoscopy [facility]  . Hyperlipidemia   . Multiple sclerosis     in remission as of 11/2011 (diagnosed late 1980s)  . GERD (gastroesophageal reflux disease)   . Hypertension   . MRSA (methicillin resistant Staphylococcus aureus)   . PCP (pneumocystis jiroveci pneumonia)     2002  . History of syphilis     noted [facility] records  . Asthma     per 2003 [facility] pulm records  pfts   . DDD (degenerative disc disease)     cervical spine  . PVD (peripheral vascular disease)     Left Stent 07/02/2008  . Depression   . Pneumonia   . UTI (urinary tract infection)   . Clotting disorder   . Blood dyscrasia     HIV  . Fever     unknwon origin  . Hep C w/o coma, chronic     Social History History  Substance Use Topics  . Smoking status: Former Smoker    Quit date: 05/09/2007  . Smokeless tobacco: Never Used  . Alcohol Use: No    Family History Family History  Problem Relation Age of Onset  . Diabetes Mother   . Coronary artery disease Mother   . Cancer Brother     colon caner stage 4 as of 11/2011 (unknown age of onset)  . Prostate cancer Brother   . Coronary artery disease Father     Past Surgical History  Procedure Laterality Date  . Other surgical history      right lower ext AKA with prothesis   . Other surgical history      left left with stents (Dr. Seward Speck)  . Other surgical history      2003 colonoscopy 5 mm polyp transverse colon; (2)76m  polyps in rectum-hyperplastic  . Above knee leg amputation      Right Leg    Allergies  Allergen Reactions  . Sulfonamide Derivatives  Hives    Current Outpatient Prescriptions  Medication Sig Dispense Refill  . atorvastatin (LIPITOR) 40 MG tablet Take 40 mg by mouth daily.      . BD PEN NEEDLE NANO U/F 32G X 4 MM MISC USE AS DIRECTED FOUR TIMES DAILY  100 each  0  . Blood Glucose Monitoring Suppl (BAYER CONTOUR MONITOR) W/DEVICE KIT Use to check blood sugar as instructed up to 4 times a day  1 kit  0  . clopidogrel (PLAVIX) 75 MG tablet Take 1 tablet (75 mg total) by mouth daily.  90 tablet  4  . GLUCERNA (GLUCERNA) LIQD Take 237 mLs by mouth 2 (two) times daily between meals.  237 mL  11  . glucose blood (BAYER CONTOUR NEXT TEST) test strip Four times daily before meals and at bedtime, insulin dependent, ICD CODE 250.02  100 each  11  . insulin glargine (LANTUS) 100 UNIT/ML injection  Inject 0.67 mLs (67 Units total) into the skin at bedtime.  10 mL  12  . insulin lispro (HUMALOG KWIKPEN) 100 UNIT/ML KiwkPen 14 units with breakfast; 24 units with lunch and dinner  15 mL  11  . Insulin Pen Needle (PEN NEEDLES 3/16") 31G X 5 MM MISC 1 application by Does not apply route 4 (four) times daily. Use to inject insulin twice daily Dx code 250.00  120 each  11  . ISENTRESS 400 MG tablet Take by mouth daily.       Marland Kitchen lamiVUDine-zidovudine (COMBIVIR) 150-300 MG per tablet 2 (two) times daily.       Marland Kitchen lisinopril (PRINIVIL,ZESTRIL) 40 MG tablet TAKE 1 TABLET BY MOUTH ONCE DAILY  30 tablet  5  . metFORMIN (GLUCOPHAGE) 500 MG tablet Take 1,000 mg by mouth 2 (two) times daily with a meal.      . omeprazole (PRILOSEC) 40 MG capsule Take 1 capsule (40 mg total) by mouth daily.  90 capsule  1  . Oxycodone HCl 10 MG TABS Take 1 tablet (10 mg total) by mouth 3 (three) times daily as needed.  90 tablet  0  . VIREAD 300 MG tablet Take by mouth daily.       . Lancets MISC Use to Test Blood Sugar Three Times Daily. Dx Code: 250.02  100 each  3   No current facility-administered medications for this visit.    ROS: See HPI for pertinent positives and negatives.   Physical Examination   Filed Vitals:   10/13/13 1551  BP: 124/68  Pulse: 73  Resp: 16  Height: 6' 1" (1.854 m)  Weight: 168 lb (76.204 kg)  SpO2: 96%  Body mass index is 22.17 kg/(m^2).  General: A&O x 3, WDWN. Gait: using right AKA prosthesis Eyes: PERRLA. Pulmonary: CTAB, without wheezes , rales or rhonchi. Cardiac: regular Rythm , without detected murmur.        Carotid Bruits Left Right   Negative Positive  Aorta is not palpable. Radial pulses: are 2+ palpable and =                           VASCULAR EXAM: Extremities without ischemic changes  without Gangrene; without open wounds. Right AKA stump has a slightly calloused area at the bony prominence, no ulceration, no abrasion.  LE Pulses LEFT RIGHT       FEMORAL  2+ palpable  not palpable        POPLITEAL  not palpable   AKA       POSTERIOR TIBIAL  not palpable   AKA        DORSALIS PEDIS      ANTERIOR TIBIAL 2+ palpable  AKA    Abdomen: soft, NT, no masses. Skin: no rashes, no ulcers noted. Musculoskeletal: no muscle wasting or atrophy.  Neurologic: A&O X 3; Appropriate Affect ; SENSATION: normal; MOTOR FUNCTION:  moving all extremities equally, motor strength 5/5 throughout. Speech is fluent/normal. CN 2-12 intact.    Non-Invasive Vascular Imaging: DATE: 10/13/2013 LOWER EXTREMITY ARTERIAL EVALUATION    INDICATION: Follow-up left popliteal artery stent     PREVIOUS INTERVENTION(S): Left popliteal artery stent placed 07/02/2008 Right AKA    DUPLEX EXAM:     RIGHT  LEFT   Peak Systolic Velocity (cm/s) Ratio (if abnormal) Waveform  Peak Systolic Velocity (cm/s) Ratio (if abnormal) Waveform     Artery - Proximal to Stent 142  T     Stent - Origin 118  T     Stent - Proximal 115  T     Stent - Mid 83  T     Stent - Distal 97  T      Stent - End 93  T     Artery - Distal to Stent 136  T  NA Today's ABI / TBI 0.83  NA Previous ABI / TBI (10/07/2012  ) 0.75    Waveform:    M - Monophasic       B - Biphasic       T - Triphasic  If Ankle Brachial Index (ABI) or Toe Brachial Index (TBI) performed, please see complete report     ADDITIONAL FINDINGS:     IMPRESSION: 1. Widely patent left popliteal artery stent. 2. Mild diffuse disease is observed in the native inflow and outflow arteries without focal stenosis.    Compared to the previous exam:  No significant change compared to prior exam.     ASSESSMENT: Nathan [name] is a 63 y.o. male who is status post angiography in August of 2013. He has a history of left leg stenting. A 7 x 40 Cordis stent was placed on February of 2010 for left leg claudication. He has single vessel runoff  via the anterior tibial artery. Ultrasound identified a high-grade in-stent stenosis. His angiography revealed that the stent was widely patent. There was narrowing at the ostium of the anterior tibial artery. No intervention was performed. He has no complaints today. He is ambulating with a right above-knee prosthesis. Has stinging and burning in foot up to left knee, attributes to neuropathy. Has occassional left calf pain with walking various distances, denies non healing wounds. Widely patent left popliteal artery stent. Mild diffuse disease is observed in the native inflow and outflow arteries without focal stenosis. No significant change compared to prior exam.   Pt denies any history of stroke or TIA. Last carotid Duplex on file was August, 2013: 40-59% bilateral ICA stenosis, right subclavian artery stenosis.  PLAN:  Patient instructed to see [facility] re pressure point redistribution of right AKA. I discussed in depth with the patient the nature of atherosclerosis, and emphasized the importance of maximal medical management including strict control of blood pressure, blood glucose, and lipid levels, obtaining regular exercise, and continued cessation of smoking.  The  patient is aware that without maximal medical management the underlying atherosclerotic disease process will progress, limiting the benefit of any interventions.  Based on the patient's vascular studies and examination, pt will return to clinic in 1 year for left ABI, left LE arterial Duplex, and carotid Duplex.  The patient was given information about PAD including signs, symptoms, treatment, what symptoms should prompt the patient to seek immediate medical care, and risk reduction measures to take.  Clemon Chambers, RN, MSN, FNP-C [facility] Phone: [phone]  Clinic MD: Trula Slade  10/13/2013 4:26 PM

## 2013-10-18 ENCOUNTER — Other Ambulatory Visit: Payer: Self-pay | Admitting: Internal Medicine

## 2013-10-23 LAB — AFB CULTURE WITH SMEAR (NOT AT ARMC): ACID FAST SMEAR: NONE SEEN

## 2013-12-08 ENCOUNTER — Telehealth: Payer: Self-pay | Admitting: *Deleted

## 2013-12-08 DIAGNOSIS — R52 Pain, unspecified: Secondary | ICD-10-CM

## 2013-12-08 DIAGNOSIS — Z113 Encounter for screening for infections with a predominantly sexual mode of transmission: Secondary | ICD-10-CM

## 2013-12-08 DIAGNOSIS — E785 Hyperlipidemia, unspecified: Secondary | ICD-10-CM

## 2013-12-08 DIAGNOSIS — B20 Human immunodeficiency virus [HIV] disease: Secondary | ICD-10-CM

## 2013-12-08 MED ORDER — OXYCODONE HCL 10 MG PO TABS: 10.0000 mg | ORAL_TABLET | Freq: Three times a day (TID) | ORAL | Status: DC | PRN

## 2013-12-08 NOTE — Telephone Encounter (Signed)
Placed printed script with the pt's pain contract in Dr. Lucianne Lei Dam's Pod for signature.  Pt would like a call back after signing.

## 2013-12-16 ENCOUNTER — Encounter (HOSPITAL_COMMUNITY): Payer: Self-pay | Admitting: Emergency Medicine

## 2013-12-16 ENCOUNTER — Emergency Department (HOSPITAL_COMMUNITY)
Admission: EM | Admit: 2013-12-16 | Discharge: 2013-12-17 | Disposition: A | Discharge status: Home / Self Care | Payer: Medicare Other | Attending: Emergency Medicine | Admitting: Emergency Medicine

## 2013-12-16 DIAGNOSIS — Z8619 Personal history of other infectious and parasitic diseases: Secondary | ICD-10-CM | POA: Diagnosis not present

## 2013-12-16 DIAGNOSIS — Z21 Asymptomatic human immunodeficiency virus [HIV] infection status: Secondary | ICD-10-CM | POA: Insufficient documentation

## 2013-12-16 DIAGNOSIS — M79609 Pain in unspecified limb: Secondary | ICD-10-CM | POA: Diagnosis not present

## 2013-12-16 DIAGNOSIS — Z860100 Personal history of colon polyps, unspecified: Secondary | ICD-10-CM | POA: Insufficient documentation

## 2013-12-16 DIAGNOSIS — Z87891 Personal history of nicotine dependence: Secondary | ICD-10-CM | POA: Insufficient documentation

## 2013-12-16 DIAGNOSIS — Z8614 Personal history of Methicillin resistant Staphylococcus aureus infection: Secondary | ICD-10-CM | POA: Insufficient documentation

## 2013-12-16 DIAGNOSIS — R112 Nausea with vomiting, unspecified: Secondary | ICD-10-CM | POA: Diagnosis not present

## 2013-12-16 DIAGNOSIS — Z87440 Personal history of urinary (tract) infections: Secondary | ICD-10-CM | POA: Insufficient documentation

## 2013-12-16 DIAGNOSIS — E119 Type 2 diabetes mellitus without complications: Secondary | ICD-10-CM | POA: Diagnosis not present

## 2013-12-16 DIAGNOSIS — E785 Hyperlipidemia, unspecified: Secondary | ICD-10-CM | POA: Diagnosis not present

## 2013-12-16 DIAGNOSIS — Z8669 Personal history of other diseases of the nervous system and sense organs: Secondary | ICD-10-CM | POA: Insufficient documentation

## 2013-12-16 DIAGNOSIS — R5383 Other fatigue: Secondary | ICD-10-CM

## 2013-12-16 DIAGNOSIS — Z862 Personal history of diseases of the blood and blood-forming organs and certain disorders involving the immune mechanism: Secondary | ICD-10-CM | POA: Diagnosis not present

## 2013-12-16 DIAGNOSIS — J45909 Unspecified asthma, uncomplicated: Secondary | ICD-10-CM | POA: Diagnosis not present

## 2013-12-16 DIAGNOSIS — K219 Gastro-esophageal reflux disease without esophagitis: Secondary | ICD-10-CM | POA: Diagnosis not present

## 2013-12-16 DIAGNOSIS — Z8701 Personal history of pneumonia (recurrent): Secondary | ICD-10-CM | POA: Insufficient documentation

## 2013-12-16 DIAGNOSIS — Z8659 Personal history of other mental and behavioral disorders: Secondary | ICD-10-CM | POA: Insufficient documentation

## 2013-12-16 DIAGNOSIS — Z79899 Other long term (current) drug therapy: Secondary | ICD-10-CM | POA: Diagnosis not present

## 2013-12-16 DIAGNOSIS — R5381 Other malaise: Secondary | ICD-10-CM | POA: Insufficient documentation

## 2013-12-16 DIAGNOSIS — Z794 Long term (current) use of insulin: Secondary | ICD-10-CM | POA: Insufficient documentation

## 2013-12-16 DIAGNOSIS — I10 Essential (primary) hypertension: Secondary | ICD-10-CM | POA: Insufficient documentation

## 2013-12-16 DIAGNOSIS — Z7902 Long term (current) use of antithrombotics/antiplatelets: Secondary | ICD-10-CM | POA: Insufficient documentation

## 2013-12-16 DIAGNOSIS — M79661 Pain in right lower leg: Secondary | ICD-10-CM

## 2013-12-16 LAB — CBC WITH DIFFERENTIAL/PLATELET
Basophils Absolute: 0 10*3/uL (ref 0.0–0.1)
Basophils Relative: 0 % (ref 0–1)
EOS PCT: 2 % (ref 0–5)
Eosinophils Absolute: 0.1 10*3/uL (ref 0.0–0.7)
HEMATOCRIT: 34.1 % — AB (ref 39.0–52.0)
HEMOGLOBIN: 11.4 g/dL — AB (ref 13.0–17.0)
LYMPHS PCT: 53 % — AB (ref 12–46)
Lymphs Abs: 2.9 10*3/uL (ref 0.7–4.0)
MCH: 33.9 pg (ref 26.0–34.0)
MCHC: 33.4 g/dL (ref 30.0–36.0)
MCV: 101.5 fL — AB (ref 78.0–100.0)
MONO ABS: 0.5 10*3/uL (ref 0.1–1.0)
MONOS PCT: 9 % (ref 3–12)
Neutro Abs: 2 10*3/uL (ref 1.7–7.7)
Neutrophils Relative %: 36 % — ABNORMAL LOW (ref 43–77)
Platelets: 203 10*3/uL (ref 150–400)
RBC: 3.36 MIL/uL — AB (ref 4.22–5.81)
RDW: 13.5 % (ref 11.5–15.5)
WBC: 5.5 10*3/uL (ref 4.0–10.5)

## 2013-12-16 LAB — BASIC METABOLIC PANEL
Anion gap: 13 (ref 5–15)
BUN: 8 mg/dL (ref 6–23)
CALCIUM: 9.7 mg/dL (ref 8.4–10.5)
CO2: 25 meq/L (ref 19–32)
Chloride: 98 mEq/L (ref 96–112)
Creatinine, Ser: 0.74 mg/dL (ref 0.50–1.35)
GFR calc Af Amer: >90 mL/min (ref >90)
GFR calc non Af Amer: >90 mL/min (ref >90)
GLUCOSE: 226 mg/dL — AB (ref 70–99)
Potassium: 4.1 mEq/L (ref 3.7–5.3)
Sodium: 136 mEq/L — ABNORMAL LOW (ref 137–147)

## 2013-12-16 MED ORDER — ONDANSETRON HCL 4 MG/2ML IJ SOLN
4.0000 mg | Freq: Once | INTRAMUSCULAR | Status: AC
  Administered 2013-12-16: 4 mg via INTRAVENOUS
  Filled 2013-12-16: qty 2

## 2013-12-16 MED ORDER — FENTANYL CITRATE 0.05 MG/ML IJ SOLN
50.0000 ug | Freq: Once | INTRAMUSCULAR | Status: AC
  Administered 2013-12-16: 50 ug via INTRAVENOUS
  Filled 2013-12-16: qty 2

## 2013-12-16 MED ORDER — SODIUM CHLORIDE 0.9 % IV BOLUS (SEPSIS)
1000.0000 mL | Freq: Once | INTRAVENOUS | Status: AC
  Administered 2013-12-16: 1000 mL via INTRAVENOUS

## 2013-12-16 NOTE — ED Notes (Signed)
Pt c/o pain in right stub; pt sts hx of phantom pain and hx of DVT; pt sts amputation was 6 years ago and pain started yesterday

## 2013-12-16 NOTE — ED Notes (Signed)
States having severe pain at 1-3 minute intervals stabbing, " just like lightening. The pain is so bad I am nauseated. " has not had pain like this for approx 4-5 months ago. Did not see dr for it.

## 2013-12-16 NOTE — ED Provider Notes (Signed)
CSN: [id number]     Arrival date & time 12/16/13  1224 History   First MD Initiated Contact with Patient 12/16/13 1234     Chief Complaint  Patient presents with  . Leg Pain     (Consider location/radiation/quality/duration/timing/severity/associated sxs/prior Treatment) Patient is a 63 y.o. male presenting with leg pain. The history is provided by the patient.  Leg Pain Location:  Leg Time since incident:  2 days Injury: no   Leg location:  R leg Pain details:    Quality:  Shooting and sharp   Severity:  Severe   Onset quality:  Sudden   Duration:  2 days   Timing:  Intermittent   Progression:  Waxing and waning Chronicity:  Recurrent Dislocation: no   Foreign body present:  No foreign bodies Prior injury to area:  No Relieved by:  Nothing Worsened by:  Nothing tried Ineffective treatments:  None tried Associated symptoms: fatigue   Associated symptoms: no fever     63 yo M with a chief complaint of right lower history pain. Patient states the pain is sharp shooting stars below his aka. Patient denies any pain with walking patient denies any wounds to that area. Patient has a history of phantom pain feels that this feels similar to past. Patient states it's been going on longer than it typically does. Pain associated with some cramping of the right thigh. Patient tried his oxycodone at home yesterday cause him to have vomiting. Patient states is unable to tolerate anything by mouth for the past 2 days. Denies any abdominal tenderness patient denies any blood or bilious emesis.  Past Medical History  Diagnosis Date  . Diabetes mellitus   . HIV infection     undetectable viral load and CD4 ct 667 as of 11/2011  . Colon polyps     noted previous colonoscopy [facility]  . Hyperlipidemia   . Multiple sclerosis     in remission as of 11/2011 (diagnosed late 1980s)  . GERD (gastroesophageal reflux disease)   . Hypertension   . MRSA (methicillin resistant Staphylococcus aureus)   .  PCP (pneumocystis jiroveci pneumonia)     2002  . History of syphilis     noted [facility] records  . Asthma     per 2003 [facility] pulm records pfts   . DDD (degenerative disc disease)     cervical spine  . PVD (peripheral vascular disease)     Left Stent 07/02/2008  . Depression   . Pneumonia   . UTI (urinary tract infection)   . Clotting disorder   . Blood dyscrasia     HIV  . Fever     unknwon origin  . Hep C w/o coma, chronic    Past Surgical History  Procedure Laterality Date  . Other surgical history      right lower ext AKA with prothesis   . Other surgical history      left left with stents (Dr. Seward Speck)  . Other surgical history      2003 colonoscopy 5 mm polyp transverse colon; (2)90m  polyps in rectum-hyperplastic  . Above knee leg amputation      Right Leg   Family History  Problem Relation Age of Onset  . Diabetes Mother   . Coronary artery disease Mother   . Cancer Brother     colon caner stage 4 as of 11/2011 (unknown age of onset)  . Prostate cancer Brother   . Coronary artery disease Father  History  Substance Use Topics  . Smoking status: Former Smoker    Quit date: 05/09/2007  . Smokeless tobacco: Never Used  . Alcohol Use: No    Review of Systems  Constitutional: Positive for fatigue. Negative for fever and chills.  HENT: Negative for congestion and facial swelling.   Eyes: Negative for discharge and visual disturbance.  Respiratory: Negative for shortness of breath.   Cardiovascular: Negative for chest pain and palpitations.  Gastrointestinal: Positive for nausea and vomiting. Negative for abdominal pain and diarrhea.  Musculoskeletal: Negative for arthralgias and myalgias.  Skin: Negative for color change and rash.  Neurological: Negative for tremors, syncope and headaches.  Psychiatric/Behavioral: Negative for confusion and dysphoric mood.      Allergies  Sulfonamide derivatives  Home Medications   Prior to Admission  medications   Medication Sig Start Date End Date Taking? Authorizing Provider  atorvastatin (LIPITOR) 40 MG tablet Take 40 mg by mouth daily.   Yes Historical Provider, MD  clopidogrel (PLAVIX) 75 MG tablet Take 1 tablet (75 mg total) by mouth daily. 01/31/13  Yes Cresenciano Genre, MD  insulin glargine (LANTUS) 100 UNIT/ML injection Inject 0.67 mLs (67 Units total) into the skin at bedtime. 05/22/13  Yes Cresenciano Genre, MD  insulin lispro (HUMALOG KWIKPEN) 100 UNIT/ML KiwkPen 14 units with breakfast; 24 units with lunch and dinner 08/06/13  Yes Cresenciano Genre, MD  ISENTRESS 400 MG tablet Take 400 mg by mouth 2 (two) times daily.  09/21/13  Yes Historical Provider, MD  lamiVUDine-zidovudine (COMBIVIR) 150-300 MG per tablet Take 1 tablet by mouth 2 (two) times daily.  09/21/13  Yes Historical Provider, MD  lisinopril (PRINIVIL,ZESTRIL) 40 MG tablet Take 40 mg by mouth daily.   Yes Historical Provider, MD  metFORMIN (GLUCOPHAGE) 500 MG tablet Take 1,000 mg by mouth 2 (two) times daily with a meal.   Yes Historical Provider, MD  omeprazole (PRILOSEC) 40 MG capsule Take 1 capsule (40 mg total) by mouth daily. 08/11/13  Yes Cresenciano Genre, MD  OxyCODONE (OXYCONTIN) 10 mg T12A 12 hr tablet Take 10 mg by mouth 3 (three) times daily as needed (for pain).   Yes Historical Provider, MD  Oxycodone HCl 10 MG TABS Take 1 tablet (10 mg total) by mouth 3 (three) times daily as needed. 12/08/13  Yes Truman Hayward, MD  VIREAD 300 MG tablet Take 300 mg by mouth daily.  09/21/13  Yes Historical Provider, MD  BD PEN NEEDLE NANO U/F 32G X 4 MM MISC USE AS DIRECTED FOUR TIMES DAILY 01/31/13   Cresenciano Genre, MD  Blood Glucose Monitoring Suppl (BAYER CONTOUR MONITOR) W/DEVICE KIT Use to check blood sugar as instructed up to 4 times a day 07/11/11   Bartholomew Crews, MD  glucose blood (BAYER CONTOUR NEXT TEST) test strip Four times daily before meals and at bedtime, insulin dependent, ICD CODE 250.02 09/22/13   Cresenciano Genre, MD   Insulin Pen Needle (PEN NEEDLES 3/16") 31G X 5 MM MISC 1 application by Does not apply route 4 (four) times daily. Use to inject insulin twice daily Dx code 250.00 11/02/11   Bartholomew Crews, MD  Lancets MISC Use to Test Blood Sugar Three Times Daily. Dx Code: 250.02 03/01/12 07/10/13  Madilyn Fireman, MD   BP 145/73  Pulse 76  Temp(Src) 97.8 F (36.6 C) (Oral)  Resp 18  Ht 6' 1" (1.854 m)  Wt 170 lb (77.111 kg)  BMI 22.43 kg/m2  SpO2 96% Physical Exam  Constitutional: He is oriented to person, place, and time. He appears well-developed and well-nourished.  HENT:  Head: Normocephalic and atraumatic.  Eyes: EOM are normal. Pupils are equal, round, and reactive to light.  Neck: Normal range of motion. Neck supple. No JVD present.  Cardiovascular: Normal rate and regular rhythm.  Exam reveals no gallop and no friction rub.   No murmur heard. Pulmonary/Chest: No respiratory distress. He has no wheezes.  Abdominal: He exhibits no distension. There is no rebound and no guarding.  Musculoskeletal: Normal range of motion.  No noted erythema to the right AKA site. No pain to palpation. No noted spasm or swelling.  Neurological: He is alert and oriented to person, place, and time.  Skin: No rash noted. No pallor.  Psychiatric: He has a normal mood and affect. His behavior is normal.    ED Course  Procedures (including critical care time) Labs Review Labs Reviewed  CBC WITH DIFFERENTIAL - Abnormal; Notable for the following:    RBC 3.36 (*)    Hemoglobin 11.4 (*)    HCT 34.1 (*)    MCV 101.5 (*)    Neutrophils Relative % 36 (*)    Lymphocytes Relative 53 (*)    All other components within normal limits  BASIC METABOLIC PANEL - Abnormal; Notable for the following:    Sodium 136 (*)    Glucose, Bld 226 (*)    All other components within normal limits    Imaging Review No results found.   EKG Interpretation None      MDM   Final diagnoses:  Pain of right lower leg     63 yo M with a chief complaint of right lower extremity pain. Patient's pain sounds neuropathic in nature. Similar to his prior neuropathic pain. Patient has tried Neurontin as well as other multiple medications for this. It appears patient has a pain contract with his infectious disease doctor. Due to patient's inability to tolerate by mouth we will check her basic labs we will give Zofran we will do oral file here in the ED. Patient with no noted abdominal tenderness on exam.   Patient able tolerate by mouth in the ED. Patient pain improved with fentanyl. Repeat abdominal exam benign.  2:13 PM:  I have discussed the diagnosis/risks/treatment options with the patient and believe the pt to be eligible for discharge home to follow-up with PCP. We also discussed returning to the ED immediately if new or worsening sx occur. We discussed the sx which are most concerning (e.g., sudden worsening pain, unable to tolerate PO) that necessitate immediate return. Medications administered to the patient during their visit and any new prescriptions provided to the patient are listed below.  Medications given during this visit Medications  sodium chloride 0.9 % bolus 1,000 mL (1,000 mLs Intravenous New Bag/Given 12/16/13 1335)  ondansetron (ZOFRAN) injection 4 mg (4 mg Intravenous Given 12/16/13 1328)  fentaNYL (SUBLIMAZE) injection 50 mcg (50 mcg Intravenous Given 12/16/13 1328)    New Prescriptions   No medications on file     Deno Etienne, MD 12/16/13 1414

## 2013-12-17 NOTE — ED Notes (Signed)
See Paper Charting

## 2013-12-23 NOTE — ED Provider Notes (Signed)
I saw and evaluated the patient, reviewed the resident's note and I agree with the findings and plan.   EKG Interpretation None     Phantom neuropathic pain; chronic.  Babette Relic, MD 12/23/13 [phone]

## 2014-01-13 ENCOUNTER — Other Ambulatory Visit: Payer: Self-pay | Admitting: Licensed Clinical Social Worker

## 2014-01-13 DIAGNOSIS — R52 Pain, unspecified: Secondary | ICD-10-CM

## 2014-01-13 DIAGNOSIS — B20 Human immunodeficiency virus [HIV] disease: Secondary | ICD-10-CM

## 2014-01-13 DIAGNOSIS — E785 Hyperlipidemia, unspecified: Secondary | ICD-10-CM

## 2014-01-13 DIAGNOSIS — Z113 Encounter for screening for infections with a predominantly sexual mode of transmission: Secondary | ICD-10-CM

## 2014-01-13 MED ORDER — OXYCODONE HCL 10 MG PO TABS: 10.0000 mg | ORAL_TABLET | Freq: Three times a day (TID) | ORAL | Status: DC | PRN

## 2014-02-03 ENCOUNTER — Encounter: Payer: Self-pay | Admitting: *Deleted

## 2014-02-10 ENCOUNTER — Other Ambulatory Visit: Payer: Self-pay | Admitting: Internal Medicine

## 2014-02-12 ENCOUNTER — Other Ambulatory Visit: Payer: Self-pay | Admitting: *Deleted

## 2014-02-12 DIAGNOSIS — B20 Human immunodeficiency virus [HIV] disease: Secondary | ICD-10-CM

## 2014-02-12 DIAGNOSIS — Z113 Encounter for screening for infections with a predominantly sexual mode of transmission: Secondary | ICD-10-CM

## 2014-02-12 DIAGNOSIS — R52 Pain, unspecified: Secondary | ICD-10-CM

## 2014-02-12 MED ORDER — OXYCODONE HCL 10 MG PO TABS: 10.0000 mg | ORAL_TABLET | Freq: Three times a day (TID) | ORAL | Status: DC | PRN

## 2014-02-12 NOTE — Telephone Encounter (Signed)
Will have Dr. Megan Salon sign for Dr. Tommy Medal.

## 2014-02-13 ENCOUNTER — Ambulatory Visit (INDEPENDENT_AMBULATORY_CARE_PROVIDER_SITE_OTHER): Payer: Medicare Other | Admitting: *Deleted

## 2014-02-13 DIAGNOSIS — Z23 Encounter for immunization: Secondary | ICD-10-CM

## 2014-03-10 ENCOUNTER — Other Ambulatory Visit: Payer: Self-pay | Admitting: Internal Medicine

## 2014-03-10 DIAGNOSIS — S78111A Complete traumatic amputation at level between right hip and knee, initial encounter: Secondary | ICD-10-CM

## 2014-03-11 ENCOUNTER — Other Ambulatory Visit: Payer: Medicare Other

## 2014-03-11 ENCOUNTER — Other Ambulatory Visit (HOSPITAL_COMMUNITY)
Admission: RE | Admit: 2014-03-11 | Discharge: 2014-03-11 | Disposition: A | Discharge status: Home / Self Care | Payer: Medicare Other | Source: Ambulatory Visit | Attending: Infectious Disease | Admitting: Infectious Disease

## 2014-03-11 DIAGNOSIS — B20 Human immunodeficiency virus [HIV] disease: Secondary | ICD-10-CM

## 2014-03-11 DIAGNOSIS — L2089 Other atopic dermatitis: Secondary | ICD-10-CM | POA: Diagnosis not present

## 2014-03-11 DIAGNOSIS — Z79899 Other long term (current) drug therapy: Secondary | ICD-10-CM | POA: Diagnosis not present

## 2014-03-11 DIAGNOSIS — E785 Hyperlipidemia, unspecified: Secondary | ICD-10-CM

## 2014-03-11 DIAGNOSIS — L209 Atopic dermatitis, unspecified: Secondary | ICD-10-CM | POA: Diagnosis not present

## 2014-03-11 DIAGNOSIS — Z113 Encounter for screening for infections with a predominantly sexual mode of transmission: Secondary | ICD-10-CM | POA: Insufficient documentation

## 2014-03-11 DIAGNOSIS — R238 Other skin changes: Secondary | ICD-10-CM

## 2014-03-11 LAB — LIPID PANEL
Cholesterol: 142 mg/dL (ref 0–200)
HDL: 27 mg/dL — ABNORMAL LOW (ref >39)
LDL Cholesterol: 72 mg/dL (ref 0–99)
TRIGLYCERIDES: 217 mg/dL — AB (ref <150)
Total CHOL/HDL Ratio: 5.3 Ratio
VLDL: 43 mg/dL — AB (ref 0–40)

## 2014-03-11 LAB — CBC WITH DIFFERENTIAL/PLATELET
BASOS ABS: 0.1 10*3/uL (ref 0.0–0.1)
Basophils Relative: 1 % (ref 0–1)
Eosinophils Absolute: 0.2 10*3/uL (ref 0.0–0.7)
Eosinophils Relative: 3 % (ref 0–5)
HEMATOCRIT: 35.2 % — AB (ref 39.0–52.0)
Hemoglobin: 11.9 g/dL — ABNORMAL LOW (ref 13.0–17.0)
LYMPHS PCT: 60 % — AB (ref 12–46)
Lymphs Abs: 3.5 10*3/uL (ref 0.7–4.0)
MCH: 32.7 pg (ref 26.0–34.0)
MCHC: 33.8 g/dL (ref 30.0–36.0)
MCV: 96.7 fL (ref 78.0–100.0)
Monocytes Absolute: 0.5 10*3/uL (ref 0.1–1.0)
Monocytes Relative: 8 % (ref 3–12)
NEUTROS ABS: 1.6 10*3/uL — AB (ref 1.7–7.7)
Neutrophils Relative %: 28 % — ABNORMAL LOW (ref 43–77)
PLATELETS: 238 10*3/uL (ref 150–400)
RBC: 3.64 MIL/uL — ABNORMAL LOW (ref 4.22–5.81)
RDW: 14.4 % (ref 11.5–15.5)
WBC: 5.8 10*3/uL (ref 4.0–10.5)

## 2014-03-11 LAB — RPR: RPR: REACTIVE — AB

## 2014-03-11 LAB — COMPLETE METABOLIC PANEL WITH GFR
ALBUMIN: 4.6 g/dL (ref 3.5–5.2)
ALT: 73 U/L — AB (ref 0–53)
AST: 59 U/L — AB (ref 0–37)
Alkaline Phosphatase: 57 U/L (ref 39–117)
BUN: 8 mg/dL (ref 6–23)
CHLORIDE: 100 meq/L (ref 96–112)
CO2: 25 meq/L (ref 19–32)
CREATININE: 0.9 mg/dL (ref 0.50–1.35)
Calcium: 9.3 mg/dL (ref 8.4–10.5)
GFR, Est Non African American: 89 mL/min
Glucose, Bld: 221 mg/dL — ABNORMAL HIGH (ref 70–99)
POTASSIUM: 4.4 meq/L (ref 3.5–5.3)
Sodium: 134 mEq/L — ABNORMAL LOW (ref 135–145)
TOTAL PROTEIN: 7.3 g/dL (ref 6.0–8.3)
Total Bilirubin: 0.6 mg/dL (ref 0.2–1.2)

## 2014-03-11 LAB — RPR TITER: RPR Titer: 1:8 {titer}

## 2014-03-11 LAB — HEPATITIS C ANTIBODY: HCV Ab: NEGATIVE

## 2014-03-12 ENCOUNTER — Telehealth: Payer: Self-pay | Admitting: *Deleted

## 2014-03-12 LAB — URINE CYTOLOGY ANCILLARY ONLY
CHLAMYDIA, DNA PROBE: NEGATIVE
Neisseria Gonorrhea: NEGATIVE

## 2014-03-12 LAB — FLUORESCENT TREPONEMAL AB(FTA)-IGG-BLD: Fluorescent Treponemal ABS: REACTIVE — AB

## 2014-03-12 LAB — MICROALBUMIN / CREATININE URINE RATIO
Creatinine, Urine: 41.3 mg/dL
MICROALB UR: 0.4 mg/dL (ref <2.0)
Microalb Creat Ratio: 9.7 mg/g (ref 0.0–30.0)

## 2014-03-12 LAB — T-HELPER CELL (CD4) - (RCID CLINIC ONLY)
CD4 % Helper T Cell: 19 % — ABNORMAL LOW (ref 33–55)
CD4 T CELL ABS: 670 /uL (ref 400–2700)

## 2014-03-12 NOTE — Telephone Encounter (Signed)
Pt needs a script faxed to [facility] for: Evaluation and treatment for R prosthetic leg, amputation of above knee Ph# [place] Fax: [phone]

## 2014-03-12 NOTE — Telephone Encounter (Signed)
faxed

## 2014-03-13 LAB — HIV-1 RNA QUANT-NO REFLEX-BLD: HIV 1 RNA Quant: <20 copies/mL (ref <20)

## 2014-03-16 ENCOUNTER — Other Ambulatory Visit: Payer: Self-pay | Admitting: *Deleted

## 2014-03-16 DIAGNOSIS — R52 Pain, unspecified: Secondary | ICD-10-CM

## 2014-03-16 MED ORDER — OXYCODONE HCL 10 MG PO TABS: 10.0000 mg | ORAL_TABLET | Freq: Three times a day (TID) | ORAL | Status: DC | PRN

## 2014-03-19 ENCOUNTER — Ambulatory Visit (INDEPENDENT_AMBULATORY_CARE_PROVIDER_SITE_OTHER): Payer: Medicare Other | Admitting: Internal Medicine

## 2014-03-19 ENCOUNTER — Encounter: Payer: Self-pay | Admitting: Internal Medicine

## 2014-03-19 VITALS — BP 143/66 | HR 69 | Temp 97.7°F | Ht 73.0 in | Wt 171.8 lb

## 2014-03-19 DIAGNOSIS — I6529 Occlusion and stenosis of unspecified carotid artery: Secondary | ICD-10-CM

## 2014-03-19 DIAGNOSIS — E1142 Type 2 diabetes mellitus with diabetic polyneuropathy: Secondary | ICD-10-CM

## 2014-03-19 DIAGNOSIS — I10 Essential (primary) hypertension: Secondary | ICD-10-CM

## 2014-03-19 DIAGNOSIS — Z0000 Encounter for general adult medical examination without abnormal findings: Secondary | ICD-10-CM | POA: Diagnosis not present

## 2014-03-19 DIAGNOSIS — Z23 Encounter for immunization: Secondary | ICD-10-CM

## 2014-03-19 LAB — POCT GLYCOSYLATED HEMOGLOBIN (HGB A1C): Hemoglobin A1C: 9.2

## 2014-03-19 LAB — GLUCOSE, CAPILLARY: Glucose-Capillary: 241 mg/dL — ABNORMAL HIGH (ref 70–99)

## 2014-03-19 MED ORDER — METFORMIN HCL 1000 MG PO TABS: 1000.0000 mg | ORAL_TABLET | Freq: Two times a day (BID) | ORAL | Status: DC

## 2014-03-19 MED ORDER — INSULIN LISPRO 100 UNIT/ML (KWIKPEN): PEN_INJECTOR | SUBCUTANEOUS | Status: DC

## 2014-03-19 MED ORDER — INSULIN GLARGINE 100 UNIT/ML ~~LOC~~ SOLN: 73.0000 [IU] | Freq: Every day | SUBCUTANEOUS | Status: DC

## 2014-03-19 NOTE — Assessment & Plan Note (Addendum)
Lab Results  Component Value Date   HGBA1C 9.2 03/19/2014   HGBA1C 7.5 10/09/2013   HGBA1C 8.7 07/10/2013     Assessment: Diabetes control: poor control (HgbA1C >9%) Progress toward A1C goal:  deteriorated Comments: none  Plan: Medications:  continue current medicationsincrease Lantus 67 to 73 units and change Humalog from 20 u to 26 units tid, continue Metformin 1000 mg bid  Home glucose monitoring: Frequency: 3 times a day Timing: before meals Instruction/counseling given: discussed foot care Educational resources provided: hyperglycemia  Self management tools provided: copy of home glucose meter download Other plans: f/u in 3 months, foot exam today, given Rx for DM shoes

## 2014-03-19 NOTE — Patient Instructions (Signed)
General Instructions: Please increase your Lantus to 73 units and your mealtime to 26 units three times at day Follow up in 3 months, call [name] in 1-2 weeks if your sugars are still high  Please bring your medicines with you each time you come to clinic.  Medicines may include prescription medications, over-the-counter medications, herbal remedies, eye drops, vitamins, or other pills.   Progress Toward Treatment Goals:  Treatment Goal 03/19/2014  Hemoglobin A1C deteriorated  Blood pressure at goal  Prevent falls -    Self Care Goals & Plans:  Self Care Goal 03/19/2014  Manage my medications take my medicines as prescribed; bring my medications to every visit; refill my medications on time; follow the sick day instructions if I am sick  Monitor my health keep track of my blood pressure; check my feet daily; keep track of my blood glucose; bring my glucose meter and log to each visit; bring my blood pressure log to each visit; keep track of my weight  Eat healthy foods eat more vegetables; drink diet soda or water instead of juice or soda; eat foods that are low in salt; eat baked foods instead of fried foods; eat fruit for snacks and desserts; eat smaller portions  Be physically active find an activity I enjoy  Prevent falls -  Meeting treatment goals maintain the current self-care plan    Home Blood Glucose Monitoring 03/19/2014  Check my blood sugar 3 times a day  When to check my blood sugar before meals     Care Management & Community Referrals:  Referral 03/19/2014  Referrals made for care management support none needed  Referrals made to community resources none        Treatment Goals:  Goals (1 Years of Data) as of 03/19/14          As of Today 03/11/14 12/16/13 12/16/13 10/13/13     Blood Pressure   . Blood Pressure < 140/90  143/66  116/60 145/73 124/68     Lifestyle   . Prevent Falls           Result Component   . HDL > 40   27      . HEMOGLOBIN A1C < 7.0   9.2       . LDL CALC < 100   72      . LDL CALC < 100   72         Progress Toward Treatment Goals:  Treatment Goal 03/19/2014  Hemoglobin A1C deteriorated  Blood pressure at goal  Prevent falls -    Self Care Goals & Plans:  Self Care Goal 03/19/2014  Manage my medications take my medicines as prescribed; bring my medications to every visit; refill my medications on time; follow the sick day instructions if I am sick  Monitor my health keep track of my blood pressure; check my feet daily; keep track of my blood glucose; bring my glucose meter and log to each visit; bring my blood pressure log to each visit; keep track of my weight  Eat healthy foods eat more vegetables; drink diet soda or water instead of juice or soda; eat foods that are low in salt; eat baked foods instead of fried foods; eat fruit for snacks and desserts; eat smaller portions  Be physically active find an activity I enjoy  Prevent falls -  Meeting treatment goals maintain the current self-care plan    Home Blood Glucose Monitoring 03/19/2014  Check my blood sugar 3  times a day  When to check my blood sugar before meals     Care Management & Community Referrals:  Referral 03/19/2014  Referrals made for care management support none needed  Referrals made to community resources none     Hyperglycemia Hyperglycemia occurs when the glucose (sugar) in your blood is too high. Hyperglycemia can happen for many reasons, but it most often happens to people who do not know they have diabetes or are not managing their diabetes properly.  CAUSES  Whether you have diabetes or not, there are other causes of hyperglycemia. Hyperglycemia can occur when you have diabetes, but it can also occur in other situations that you might not be as aware of, such as: Diabetes  If you have diabetes and are having problems controlling your blood glucose, hyperglycemia could occur because of some of the following reasons:  Not  following your meal plan.  Not taking your diabetes medications or not taking it properly.  Exercising less or doing less activity than you normally do.  Being sick. Pre-diabetes  This cannot be ignored. Before people develop Type 2 diabetes, they almost always have "pre-diabetes." This is when your blood glucose levels are higher than normal, but not yet high enough to be diagnosed as diabetes. Research has shown that some long-term damage to the body, especially the heart and circulatory system, may already be occurring during pre-diabetes. If you take action to manage your blood glucose when you have pre-diabetes, you may delay or prevent Type 2 diabetes from developing. Stress  If you have diabetes, you may be "diet" controlled or on oral medications or insulin to control your diabetes. However, you may find that your blood glucose is higher than usual in the hospital whether you have diabetes or not. This is often referred to as "stress hyperglycemia." Stress can elevate your blood glucose. This happens because of hormones put out by the body during times of stress. If stress has been the cause of your high blood glucose, it can be followed regularly by your caregiver. That way he/she can make sure your hyperglycemia does not continue to get worse or progress to diabetes. Steroids  Steroids are medications that act on the infection fighting system (immune system) to block inflammation or infection. One side effect can be a rise in blood glucose. Most people can produce enough extra insulin to allow for this rise, but for those who cannot, steroids make blood glucose levels go even higher. It is not unusual for steroid treatments to "uncover" diabetes that is developing. It is not always possible to determine if the hyperglycemia will go away after the steroids are stopped. A special blood test called an A1c is sometimes done to determine if your blood glucose was elevated before the steroids were  started. SYMPTOMS  Thirsty.  Frequent urination.  Dry mouth.  Blurred vision.  Tired or fatigue.  Weakness.  Sleepy.  Tingling in feet or leg. DIAGNOSIS  Diagnosis is made by monitoring blood glucose in one or all of the following ways:  A1c test. This is a chemical found in your blood.  Fingerstick blood glucose monitoring.  Laboratory results. TREATMENT  First, knowing the cause of the hyperglycemia is important before the hyperglycemia can be treated. Treatment may include, but is not be limited to:  Education.  Change or adjustment in medications.  Change or adjustment in meal plan.  Treatment for an illness, infection, etc.  More frequent blood glucose monitoring.  Change in exercise plan.  Decreasing or stopping steroids.  Lifestyle changes. HOME CARE INSTRUCTIONS   Test your blood glucose as directed.  Exercise regularly. Your caregiver will give you instructions about exercise. Pre-diabetes or diabetes which comes on with stress is helped by exercising.  Eat wholesome, balanced meals. Eat often and at regular, fixed times. Your caregiver or nutritionist will give you a meal plan to guide your sugar intake.  Being at an ideal weight is important. If needed, losing as little as 10 to 15 pounds may help improve blood glucose levels. SEEK MEDICAL CARE IF:   You have questions about medicine, activity, or diet.  You continue to have symptoms (problems such as increased thirst, urination, or weight gain). SEEK IMMEDIATE MEDICAL CARE IF:   You are vomiting or have diarrhea.  Your breath smells fruity.  You are breathing faster or slower.  You are very sleepy or incoherent.  You have numbness, tingling, or pain in your feet or hands.  You have chest pain.  Your symptoms get worse even though you have been following your caregiver's orders.  If you have any other questions or concerns. Document Released: 10/18/2000 Document Revised: 07/17/2011  Document Reviewed: 08/21/2011 [facility] Patient Information 2015 [place], [place]. This information is not intended to replace advice given to you by your health care provider. Make sure you discuss any questions you have with your health care provider.

## 2014-03-19 NOTE — Assessment & Plan Note (Signed)
Given pna 23 today, will need prevnar in 1 year, given Zoster Rx today to get at pharmacy, Tdap to ask [facility] about if can get

## 2014-03-19 NOTE — Progress Notes (Signed)
   Subjective:    Patient ID: Nathan [name], male    DOB: 11-May-1950, 63 y.o.   MRN: [id number]  HPI Comments: 63 y.o PMH DM 2 associated with PVD (HA1C 9.2 07/10/13), controlled HIV (highly resistant) (cd4 670, viral load wnl 03/11/2014), dyslipidemia (LDL 72 03/11/2014), HTN (BP 143/66), h/o right AKA with prosthetic leg, h/o colon polyps, GERD, h/o Hep B core Ab +, h/o MRSA, h/o MS, h/o PVD, emphysema per last PET scan results, recent history of syphillis with h/o +RPR, phantom limb pain (gets narcotics from Dr. Tommy Medal), EF 04/2013 60-65%.    1) He presents for f/u DM. Lantus 67 u qhs, 14, 24 and 24 units prandial. HA1C 9.2 today wit hcbg of 241.  Meter readings run from 140s (148)->400s (431) (mostly hyperglycemic for 1 month) and he is checking 2x-3 x per day. No h/o <70 values. Meter avg is 253.  He admits to diet noncompliance.  He is interested and DM shoes and inserts.  He is getting a new prosthetic leg b/c recently old one broke 2) H/o FUO resolved found to be RPR + now tx'ed.   3) Fall on Sunday not injurying anything with new right prosthesis    Health maintenance -Due for Tdap (will disc with [facility]), Zoster (given RX today), Prevnar (will give 1 year from today), Pneumonia 23 vaccine given today, foot exam done today -eye exam due 07/2014        Review of Systems  Respiratory: Negative for shortness of breath.   Cardiovascular: Negative for chest pain.  Gastrointestinal: Negative for constipation.  Skin: Negative for rash.       Objective:   Physical Exam  Constitutional: He is oriented to person, place, and time. He appears well-developed and well-nourished. He is cooperative.  BP slightly elevated   HENT:  Head: Normocephalic and atraumatic.  Mouth/Throat: He has dentures. No oropharyngeal exudate.  Eyes: Conjunctivae are normal. Right eye exhibits no discharge. Left eye exhibits no discharge. No scleral icterus.  Cardiovascular: Normal rate, regular rhythm, S1  normal, S2 normal and normal heart sounds.   No murmur heard. Pulses:      Dorsalis pedis pulses are 1+ on the left side.       Posterior tibial pulses are 1+ on the left side.  No lower ext edema   Pulmonary/Chest: Effort normal and breath sounds normal.  Abdominal: Soft. Bowel sounds are normal. He exhibits no distension. There is no tenderness.  Musculoskeletal: He exhibits no edema.  Neurological: He is alert and oriented to person, place, and time. Gait normal.  Walks with right prosthesis  Skin: Skin is warm, dry and intact. No rash noted.  Psychiatric: He has a normal mood and affect. His speech is normal and behavior is normal. Judgment and thought content normal. Cognition and memory are normal.  Nursing note and vitals reviewed.         Assessment & Plan:  F/u in 3 months

## 2014-03-19 NOTE — Assessment & Plan Note (Signed)
BP Readings from Last 3 Encounters:  03/19/14 143/66  12/16/13 116/60  10/13/13 124/68    Lab Results  Component Value Date   NA 134* 03/11/2014   K 4.4 03/11/2014   CREATININE 0.90 03/11/2014    Assessment: Blood pressure control: controlled overall slightly elevated today Progress toward BP goal:  at goal Comments: will reassess at f/u goal for DM <130/<80  Plan: Medications:  continue current medications (Lisinopril 40 mg qd), if continues to be elevated try low dose TZD or CCB at f/u Other plans: reassess at f/u in 3 months

## 2014-03-20 NOTE — Progress Notes (Signed)
Internal Medicine Clinic Attending  Case discussed with Dr. McLean soon after the resident saw the patient.  We reviewed the resident's history and exam and pertinent patient test results.  I agree with the assessment, diagnosis, and plan of care documented in the resident's note. 

## 2014-03-23 ENCOUNTER — Ambulatory Visit: Payer: Medicare Other | Attending: Internal Medicine | Admitting: Physical Therapy

## 2014-03-23 ENCOUNTER — Encounter: Payer: Self-pay | Admitting: Physical Therapy

## 2014-03-23 VITALS — BP 148/69 | HR 74 | Resp 14

## 2014-03-23 DIAGNOSIS — Z5189 Encounter for other specified aftercare: Secondary | ICD-10-CM | POA: Insufficient documentation

## 2014-03-23 DIAGNOSIS — Z89611 Acquired absence of right leg above knee: Secondary | ICD-10-CM

## 2014-03-23 DIAGNOSIS — R269 Unspecified abnormalities of gait and mobility: Secondary | ICD-10-CM | POA: Insufficient documentation

## 2014-03-23 NOTE — Therapy (Signed)
Physical Therapy Evaluation  Patient Details  Name: Nathan [name] MRN: [id number] Date of Birth: 07-13-1950  Encounter Date: 03/23/2014      PT End of Session - 03/23/14 0905    Visit Number 1   Number of Visits 1   PT Start Time 0800   PT Stop Time 0905   PT Time Calculation (min) 65 min   Equipment Utilized During Treatment Other (comment)  Rheo 3 microprocessor knee prosthesis (loaner only)   Activity Tolerance Patient tolerated treatment well   Behavior During Therapy [facility] for tasks assessed/performed      Past Medical History  Diagnosis Date  . Diabetes mellitus   . HIV infection     undetectable viral load and CD4 ct 667 as of 11/2011  . Colon polyps     noted previous colonoscopy [facility]  . Hyperlipidemia   . Multiple sclerosis     in remission as of 11/2011 (diagnosed late 1980s)  . GERD (gastroesophageal reflux disease)   . Hypertension   . MRSA (methicillin resistant Staphylococcus aureus)   . PCP (pneumocystis jiroveci pneumonia)     2002  . History of syphilis     noted [facility] records  . Asthma     per 2003 [facility] pulm records pfts   . DDD (degenerative disc disease)     cervical spine  . PVD (peripheral vascular disease)     Left Stent 07/02/2008  . Depression   . Pneumonia   . UTI (urinary tract infection)   . Clotting disorder   . Blood dyscrasia     HIV  . Fever     unknwon origin  . Hep C w/o coma, chronic     Past Surgical History  Procedure Laterality Date  . Other surgical history      right lower ext AKA with prothesis   . Other surgical history      left left with stents (Dr. Seward Speck)  . Other surgical history      2003 colonoscopy 5 mm polyp transverse colon; (2)80mm  polyps in rectum-hyperplastic  . Above knee leg amputation      Right Leg    BP 148/69 mmHg  Pulse 74  Resp 14  SpO2 99%  Visit Diagnosis:  Status post above knee amputation of right lower extremity  Abnormality of gait      Subjective Assessment -  03/23/14 0809    Symptoms Patient presents today to justify a new prosthesis to walk with prosthetic knee bending without fear of falling.   Patient Stated Goals Patient presents today to justify a new prosthesis to walk with prosthetic knee bending without fear of falling.   Currently in Pain? No/denies          [facility] PT Assessment - 03/23/14 0800    Assessment   Medical Diagnosis AKA   Onset Date 12/04/07   Next MD Visit 03/25/2104   Precautions   Precautions Fall   Restrictions   Weight Bearing Restrictions No   Balance Screen   Has the patient fallen in the past 6 months Yes   How many times? 1x/wk with prosthesis   Has the patient had a decrease in activity level because of a fear of falling?  Yes   Is the patient reluctant to leave their home because of a fear of falling?  No   Home Environment   Living Enviornment Private residence   Living Arrangements Non-relatives/Friends;Other (Comment)  Roommate only to share to rent  Type of Home Apartment   Home Access Stairs to enter   Entrance Stairs-Number of Steps 4   Entrance Stairs-Rails Can reach both   Home Layout Two level;1/2 bath on main level;Bed/bath upstairs   Alternate Level Stairs-Number of Steps 14   Alternate Level Stairs-Rails Right   Prior Function   Level of Independence Independent with basic ADLs;Independent with homemaking with ambulation;Independent with gait;Independent with transfers   Vocation On disability   Cognition   Overall Cognitive Status Within Functional Limits for tasks assessed   Observation/Other Assessments   Skin Integrity Reports no skin breakdown or rash   AROM   Overall AROM  Within functional limits for tasks performed;Other (comment)  Right Thomas Hip Extension 0degrees    Strength   Right Hip Flexion 4/5   Right Hip Extension 5/5   Right Hip ABduction 5/5   Left Hip Flexion 5/5   Left Hip Extension 5/5   Left Hip ABduction 5/5   Transfers   Transfers Sit to Stand;Stand  to Sit;Stand Pivot Transfers   Sit to Stand 6: Modified independent (Device/Increase time)   Stand to Sit 6: Modified independent (Device/Increase time)   Stand Pivot Transfers 6: Modified independent (Device/Increase time)   Ambulation/Gait   Ambulation/Gait Yes   Ambulation/Gait Assistance 6: Modified independent (Device/Increase time)   Ambulation Distance (Feet) 2000 Feet   Assistive device None;Other (Comment)  Rheo 3 microprocessor AKA prosthesis (loaner for trial)   Gait Pattern Step-through pattern;Decreased stance time - right;Right hip hike  vaulting, flexes prosthetic knee ~30 degrees in swing    Gait velocity 3.09 ft/sec   Stairs Yes   Stairs Assistance 6: Modified independent (Device/Increase time)   Stair Management Technique One rail Right;Other (comment)  ascend without rail, descend with 1 rail   Number of Stairs 8   Ramp 6: Modified independent (Device);Other (comment)  no device except prosthesis, flexed knee up & down   Curb 6: Modified independent (Device/increase time);Other (comment)  no device except prosthesis   6 Minute Walk- Baseline   6 Minute Walk- Baseline yes   BP (mmHg) 166/74 mmHg  pre-exercise 148/69   HR (bpm) 85  pre HR 74   02 Sat (%RA) 99 %  pre 99%, respiration rate 16 after 14 pre   Modified Borg Scale for Dyspnea 0.5- Very, very slight shortness of breath   Perceived Rate of Exertion (Borg) 11- Fairly light   Berg Balance Test   Sit to Stand Able to stand  independently using hands   Standing Unsupported Able to stand safely 2 minutes   Sitting with Back Unsupported but Feet Supported on Floor or Stool Able to sit safely and securely 2 minutes   Stand to Sit Sits safely with minimal use of hands   Transfers Able to transfer safely, minor use of hands   Standing Unsupported with Eyes Closed Able to stand 10 seconds safely   Standing Ubsupported with Feet Together Able to place feet together independently and stand 1 minute safely   From  Standing, Reach Forward with Outstretched Arm Can reach confidently >25 cm (10")   From Standing Position, Pick up Object from Floor Able to pick up shoe safely and easily   From Standing Position, Turn to Look Behind Over each Shoulder Looks behind from both sides and weight shifts well   Turn 360 Degrees Able to turn 360 degrees safely in 4 seconds or less   Standing Unsupported, Alternately Place Feet on Step/Stool Able to stand independently  and safely and complete 8 steps in 20 seconds   Standing Unsupported, One Foot in Front Able to plae foot ahead of the other independently and hold 30 seconds   Standing on One Leg Able to lift leg independently and hold > 10 seconds   Dynamic Gait Index   Level Surface Mild Impairment   Change in Gait Speed Mild Impairment   Gait with Horizontal Head Turns Mild Impairment   Gait with Vertical Head Turns Mild Impairment   Gait and Pivot Turn Mild Impairment   Step Over Obstacle Mild Impairment   Step Around Obstacles Mild Impairment   Steps Mild Impairment  5.72 sec for 4 steps   Total Score 16   Functional Gait  Assessment   Gait Level Surface Walks 20 ft in less than 7 sec but greater than 5.5 sec, uses assistive device, slower speed, mild gait deviations, or deviates 6-10 in outside of the 12 in walkway width.  6.50 sec   Change in Gait Speed Able to change speed, demonstrates mild gait deviations, deviates 6-10 in outside of the 12 in walkway width, or no gait deviations, unable to achieve a major change in velocity, or uses a change in velocity, or uses an assistive device.  5.62 sec   Gait with Horizontal Head Turns Performs head turns smoothly with slight change in gait velocity (eg, minor disruption to smooth gait path), deviates 6-10 in outside 12 in walkway width, or uses an assistive device.  6.12sec   Gait with Vertical Head Turns Performs task with slight change in gait velocity (eg, minor disruption to smooth gait path), deviates 6 - 10  in outside 12 in walkway width or uses assistive device  6.66sec   Gait and Pivot Turn Pivot turns safely within 3 sec and stops quickly with no loss of balance.   Step Over Obstacle Is able to step over 2 stacked shoe boxes taped together (9 in total height) without changing gait speed. No evidence of imbalance.   Gait with Narrow Base of Support Ambulates less than 4 steps heel to toe or cannot perform without assistance.   Gait with Eyes Closed Walks 20 ft, uses assistive device, slower speed, mild gait deviations, deviates 6-10 in outside 12 in walkway width. Ambulates 20 ft in less than 9 sec but greater than 7 sec.  7.72sed   Ambulating Backwards Walks 20 ft, uses assistive device, slower speed, mild gait deviations, deviates 6-10 in outside 12 in walkway width.  10.15sec   Steps Two feet to a stair, must use rail.  does not have to use rail to ascend step-to pattern   Total Score 19                  Plan - 03/23/14 0910    Clinical Impression Statement This 63yo male underwent a right Transfemoral Amputation in 2009 due to vascular disease. He recieved his first prosthesis within 6 months and bacame functional on a K3 level prosthesis with a cane. He had a typical socket revision with first prosthesis. He recieved a second prosthesis on 8/67/6720 with hydraulic microprocessor knee. He fell multiple times initially and began ambulating without unlocking the prosthetic knee. He continued to fall 1 or more times per week using modified gait pattern.  He was fitted ~2 weeks ago with loaner for trial only Rheo 3 microprocessor that works off magnetic fluid system . He reports only 1 fall when standing in kitchen and turned to walk away. He  is bending this prosthetic knee consistently. Patient reports significant increased confidence with Rheo 3 knee. PT evaluated today to determine functional level & appropriateness of new prosthesis with Rheo 3 knee.   Pt will benefit from skilled  therapeutic intervention in order to improve on the following deficits Abnormal gait   Rehab Potential Good   PT Frequency One time visit   PT Treatment/Interventions Other (comment);Patient/family education  Prosthetic Prescription Eval   Recommended Other Services This 63yo male would benefit from new K3 (full community with variable cadence). Patient has improved gait with this knee unit with less deviations to advance prosthesis in swing with ~50% of normal knee flexion compared to no knee flexion in current prosthesis. He ambulates 08-31-98' (longer distance) without fatigue, gait velocity of 3.09 ft/sec which indicates community ambulator with lowfall risk, Berg Balance 54/56 (indicates <25% fall risk), Dynamic Gait Index 16/24 (<19/24 indicates fall risk but old knee would be <8/24), Functional Gait Assessment 19/30 (19-24 indicates medium fall risk). PT recommends new K3 prosthesis to include Rheo 3 knee to allow magnetic fluid resistance, K3 foot similar to VeriFlex XC, ischial containment socket with flexible inner liner at least at brim, seal-in liner with suction ring.   Consulted and Agree with Plan of Care Patient          G-Codes - April 16, 2014 31-Aug-1226    Functional Assessment Tool Used Berg Balance 54/56, DGI 16/24,    Functional Limitation Mobility: Walking and moving around   Mobility: Walking and Moving Around Current Status [phone]) At least 1 percent but less than 20 percent impaired, limited or restricted   Mobility: Walking and Moving Around Goal Status [phone]) At least 1 percent but less than 20 percent impaired, limited or restricted   Mobility: Walking and Moving Around Discharge Status [phone]) At least 1 percent but less than 20 percent impaired, limited or restricted      Problem List Patient Active Problem List   Diagnosis Date Noted  . Pain in limb-Right Leg 10/13/2013  . Syphilis 10/09/2013  . Abnormality of gait 08/14/2012  . Atherosclerosis of native arteries of the  extremities with intermittent claudication 04/08/2012  . Occlusion and stenosis of carotid artery without mention of cerebral infarction 12/11/2011  . Phantom limb syndrome (right lower limb) 11/23/2011  . Health care maintenance 11/23/2011  . Hepatitis B core antibody positive 11/23/2011  . AIDS with cachexia 11/02/2010  . MRSA colonization 09/21/2010  . Furuncle 09/21/2010  . AKA, RIGHT, HX OF 08/02/2009  . DM type 2 with diabetic peripheral neuropathy 04/05/2009  . DYSPEPSIA 02/24/2008  . Essential (primary) hypertension 02/13/2008  . HYPERLIPIDEMIA 07/29/2007  . HIV DISEASE 02/12/2006  . COLONIC POLYPS, ADENOMATOUS 02/12/2006  . Multiple sclerosis 02/12/2006  . PERIPHERAL VASCULAR DISEASE 02/12/2006  . GERD 02/12/2006  . HEPATITIS B, HX OF (Pt is unaware of dx but states it remains in his chart no prev or current tx as of 11/2011) 02/12/2006          Prosthetics Assessment - 2014-04-16 0001    Prosthetic Care Independent with Skin check;Residual limb care;Care of non-amputated limb;Prosthetic cleaning;Ply sock cleaning;Correct ply sock adjustment;Proper wear schedule/adjustment;Proper weight-bearing schedule/adjustment   Donning prosthesis  Independent   Doffing prosthesis  Independent   Current prosthetic wear tolerance (days/week)  7 days/week   Current prosthetic wear tolerance (#hours/day)  >90% of awake hours   Current prosthetic weight-bearing tolerance (hours/day)  >15 minutes of standing & gait without pain or discomfort   K code/activity level  with prosthetic use  3         Jaylinn Hellenbrand 03/23/2014, 12:32 PM

## 2014-03-25 ENCOUNTER — Ambulatory Visit (INDEPENDENT_AMBULATORY_CARE_PROVIDER_SITE_OTHER): Payer: Medicare Other | Admitting: Infectious Disease

## 2014-03-25 ENCOUNTER — Encounter: Payer: Self-pay | Admitting: Infectious Disease

## 2014-03-25 VITALS — BP 153/80 | HR 76 | Temp 97.8°F | Wt 174.0 lb

## 2014-03-25 DIAGNOSIS — R7989 Other specified abnormal findings of blood chemistry: Secondary | ICD-10-CM | POA: Diagnosis not present

## 2014-03-25 DIAGNOSIS — A529 Late syphilis, unspecified: Secondary | ICD-10-CM

## 2014-03-25 DIAGNOSIS — I6529 Occlusion and stenosis of unspecified carotid artery: Secondary | ICD-10-CM

## 2014-03-25 DIAGNOSIS — I739 Peripheral vascular disease, unspecified: Secondary | ICD-10-CM

## 2014-03-25 DIAGNOSIS — E1159 Type 2 diabetes mellitus with other circulatory complications: Secondary | ICD-10-CM | POA: Diagnosis not present

## 2014-03-25 DIAGNOSIS — R945 Abnormal results of liver function studies: Secondary | ICD-10-CM

## 2014-03-25 DIAGNOSIS — E1042 Type 1 diabetes mellitus with diabetic polyneuropathy: Secondary | ICD-10-CM | POA: Diagnosis not present

## 2014-03-25 DIAGNOSIS — B20 Human immunodeficiency virus [HIV] disease: Secondary | ICD-10-CM

## 2014-03-25 DIAGNOSIS — I10 Essential (primary) hypertension: Secondary | ICD-10-CM

## 2014-03-25 DIAGNOSIS — E1151 Type 2 diabetes mellitus with diabetic peripheral angiopathy without gangrene: Secondary | ICD-10-CM

## 2014-03-25 NOTE — Progress Notes (Signed)
Subjective:    Patient ID: Nathan [name], male    DOB: [birth year], 63 y.o.   MRN: [id number]  HPI   Nathan [name] is a 63 y.o. male whose HIV has been perfectly suppressed with salvage regimen of  twice daily combivir, isentress and once daily viread with undetectable viral load and health cd4 count. (he has EXTENSIVE R with R to all NNRTI, R to nearly all PI except DRV, with still activity from TNF, AZT and the isentress he is currently on.  I saw him this winter when he had FUO and admitted him to [facility] and he had workup that did not reveal a clear cut cause. The IM Teaching service who thought fevers might have been due to statin, which was stopped. Fevers abated but I did not believe they were ever due to statin and he restarted with no problems of fevers or myalgias whatsoever.   However since then his fevers HAD returned and have been quite high. They persisted after he stopped his statin.  I was made aware of the fevers but was under the impression via communication with my staff that he did not wish to be seen urgently. He then developed a rash that started on legs and spread over his entire body sparing hands and feet and without mucosal involvement. He saw Dr. Eyvonne Mechanic and Dr. Eppie Gibson and RPR was checked (but without checking for prozone effect( and QF gold  HIs rash has worsened.  I saw him in clinic and performed punch biopsies x 2 sites. Repeat RPR CHECKED FOR PROZONE showed RPR 1:512 and treponemes were seen on biopsy by pathology.  He has received  3rd dose IM pcn with proper drop in RPR titer since then  He has no other complaints today   Review of Systems  Constitutional: Negative for chills, diaphoresis, activity change, appetite change and unexpected weight change.  HENT: Negative for sinus pressure, sneezing and trouble swallowing.   Eyes: Negative for photophobia and visual disturbance.  Respiratory: Negative for chest tightness and stridor.   Cardiovascular: Negative  for palpitations and leg swelling.  Gastrointestinal: Negative for constipation, blood in stool, abdominal distention and anal bleeding.  Genitourinary: Negative for dysuria, hematuria, flank pain and difficulty urinating.  Musculoskeletal: Negative for myalgias, back pain, joint swelling, arthralgias and gait problem.  Skin: Negative for color change, pallor and wound.  Neurological: Negative for dizziness, tremors, weakness and light-headedness.  Hematological: Negative for adenopathy. Does not bruise/bleed easily.  Psychiatric/Behavioral: Negative for behavioral problems, confusion, sleep disturbance, dysphoric mood, decreased concentration and agitation.       Objective:   Physical Exam  Constitutional: He is oriented to person, place, and time. He appears well-developed and well-nourished. No distress.  HENT:  Head: Normocephalic and atraumatic.  Mouth/Throat: Oropharynx is clear and moist. No oropharyngeal exudate.  Eyes: Conjunctivae and EOM are normal. No scleral icterus.  Neck: Normal range of motion. Neck supple.  Cardiovascular: Normal rate and regular rhythm.   Pulmonary/Chest: Effort normal. No respiratory distress. He has no wheezes.  Abdominal: He exhibits no distension.  Musculoskeletal: He exhibits no edema or tenderness.  Neurological: He is alert and oriented to person, place, and time.  Skin: Skin is warm and dry. He is not diaphoretic. No erythema. No pallor.  Psychiatric: He has a normal mood and affect. His behavior is normal. Judgment and thought content normal.        Assessment & Plan:   Disseminated syphilis with hepatitis: RPR dropping by  proper amount.   Elevated LFTs:  Pt still with elevated LFTs, may be chronic issue related to HIV, his statin, regardless would not stop any of his meds at this point. CT abdomen did not show problems with liver   HIV: highly R virus.  --continue current regimen  DM: followed closely in IM  clinic   Neuropathic pain and phantom limb pain: continue  oxycodone tid prn.    HTN: bp up. Needs to followup with PCP

## 2014-04-13 ENCOUNTER — Other Ambulatory Visit: Payer: Self-pay | Admitting: *Deleted

## 2014-04-13 DIAGNOSIS — R52 Pain, unspecified: Secondary | ICD-10-CM

## 2014-04-13 MED ORDER — OXYCODONE HCL 10 MG PO TABS: 10.0000 mg | ORAL_TABLET | Freq: Three times a day (TID) | ORAL | Status: DC | PRN

## 2014-04-16 ENCOUNTER — Encounter (HOSPITAL_COMMUNITY): Payer: Self-pay | Admitting: Surgery

## 2014-04-19 ENCOUNTER — Other Ambulatory Visit: Payer: Self-pay | Admitting: Internal Medicine

## 2014-05-05 ENCOUNTER — Telehealth: Payer: Self-pay | Admitting: *Deleted

## 2014-05-05 NOTE — Telephone Encounter (Signed)
Call to [employer] about patient's need for Prosthetic leg.  Office has received prescription.  Needs to have more detail.  Will need to get codes for need for Prosthetic leg and office notes that speak to need for Prosthetic leg.  Also form for completion was faxed today and placed in box for Dr. Aundra Dubin to complete.  Sander Nephew, RN 05/05/2014 1:55 PM.

## 2014-05-09 ENCOUNTER — Other Ambulatory Visit: Payer: Self-pay | Admitting: Infectious Disease

## 2014-05-18 ENCOUNTER — Other Ambulatory Visit: Payer: Self-pay | Admitting: *Deleted

## 2014-05-18 DIAGNOSIS — R52 Pain, unspecified: Secondary | ICD-10-CM

## 2014-05-18 MED ORDER — OXYCODONE HCL 10 MG PO TABS: 10.0000 mg | ORAL_TABLET | Freq: Three times a day (TID) | ORAL | Status: DC | PRN

## 2014-06-17 ENCOUNTER — Other Ambulatory Visit: Payer: Self-pay | Admitting: *Deleted

## 2014-06-17 DIAGNOSIS — R52 Pain, unspecified: Secondary | ICD-10-CM

## 2014-06-17 MED ORDER — OXYCODONE HCL 10 MG PO TABS: 10.0000 mg | ORAL_TABLET | Freq: Three times a day (TID) | ORAL | Status: DC | PRN

## 2014-07-10 ENCOUNTER — Encounter: Payer: Self-pay | Admitting: Internal Medicine

## 2014-07-13 ENCOUNTER — Other Ambulatory Visit: Payer: Self-pay | Admitting: Internal Medicine

## 2014-07-16 ENCOUNTER — Telehealth: Payer: Self-pay | Admitting: *Deleted

## 2014-07-16 DIAGNOSIS — R52 Pain, unspecified: Secondary | ICD-10-CM

## 2014-07-16 MED ORDER — OXYCODONE HCL 10 MG PO TABS: 10.0000 mg | ORAL_TABLET | Freq: Three times a day (TID) | ORAL | Status: DC | PRN

## 2014-07-16 NOTE — Telephone Encounter (Signed)
Pt would like a call once rx has been signed.  RX placed in Dr. Lucianne Lei Dam's box for signature.

## 2014-07-29 ENCOUNTER — Other Ambulatory Visit: Payer: Self-pay | Admitting: *Deleted

## 2014-07-29 DIAGNOSIS — IMO0002 Reserved for concepts with insufficient information to code with codable children: Secondary | ICD-10-CM

## 2014-07-29 DIAGNOSIS — E1165 Type 2 diabetes mellitus with hyperglycemia: Principal | ICD-10-CM

## 2014-07-29 DIAGNOSIS — E1142 Type 2 diabetes mellitus with diabetic polyneuropathy: Secondary | ICD-10-CM

## 2014-07-29 MED ORDER — GLUCOSE BLOOD VI STRP: ORAL_STRIP | Status: DC

## 2014-08-08 ENCOUNTER — Other Ambulatory Visit: Payer: Self-pay | Admitting: Internal Medicine

## 2014-08-12 ENCOUNTER — Other Ambulatory Visit: Payer: Self-pay | Admitting: *Deleted

## 2014-08-12 DIAGNOSIS — E1142 Type 2 diabetes mellitus with diabetic polyneuropathy: Secondary | ICD-10-CM

## 2014-08-12 DIAGNOSIS — E1165 Type 2 diabetes mellitus with hyperglycemia: Principal | ICD-10-CM

## 2014-08-12 DIAGNOSIS — IMO0002 Reserved for concepts with insufficient information to code with codable children: Secondary | ICD-10-CM

## 2014-08-12 MED ORDER — GLUCOSE BLOOD VI STRP: ORAL_STRIP | Status: DC

## 2014-08-17 ENCOUNTER — Other Ambulatory Visit: Payer: Self-pay | Admitting: Licensed Clinical Social Worker

## 2014-08-17 DIAGNOSIS — R52 Pain, unspecified: Secondary | ICD-10-CM

## 2014-08-17 MED ORDER — OXYCODONE HCL 10 MG PO TABS: 10.0000 mg | ORAL_TABLET | Freq: Three times a day (TID) | ORAL | Status: DC | PRN

## 2014-08-18 ENCOUNTER — Other Ambulatory Visit: Payer: Self-pay | Admitting: Internal Medicine

## 2014-08-26 ENCOUNTER — Telehealth: Payer: Self-pay | Admitting: Internal Medicine

## 2014-08-26 NOTE — Telephone Encounter (Signed)
Call to patient to confirm appointment for 08/27/14 at 1:45 lmtcb

## 2014-08-27 ENCOUNTER — Ambulatory Visit (INDEPENDENT_AMBULATORY_CARE_PROVIDER_SITE_OTHER): Payer: Medicare Other | Admitting: Internal Medicine

## 2014-08-27 ENCOUNTER — Encounter: Payer: Self-pay | Admitting: Internal Medicine

## 2014-08-27 VITALS — BP 140/70 | HR 97 | Temp 98.1°F | Ht 73.0 in | Wt 173.4 lb

## 2014-08-27 DIAGNOSIS — Z0000 Encounter for general adult medical examination without abnormal findings: Secondary | ICD-10-CM

## 2014-08-27 DIAGNOSIS — Z794 Long term (current) use of insulin: Secondary | ICD-10-CM

## 2014-08-27 DIAGNOSIS — E1142 Type 2 diabetes mellitus with diabetic polyneuropathy: Secondary | ICD-10-CM

## 2014-08-27 DIAGNOSIS — Z21 Asymptomatic human immunodeficiency virus [HIV] infection status: Secondary | ICD-10-CM

## 2014-08-27 DIAGNOSIS — I10 Essential (primary) hypertension: Secondary | ICD-10-CM | POA: Diagnosis not present

## 2014-08-27 LAB — BASIC METABOLIC PANEL WITH GFR
BUN: 16 mg/dL (ref 6–23)
CHLORIDE: 99 meq/L (ref 96–112)
CO2: 21 mEq/L (ref 19–32)
Calcium: 9.2 mg/dL (ref 8.4–10.5)
Creat: 1.02 mg/dL (ref 0.50–1.35)
GFR, EST AFRICAN AMERICAN: 89 mL/min
GFR, Est Non African American: 77 mL/min
Glucose, Bld: 304 mg/dL — ABNORMAL HIGH (ref 70–99)
Potassium: 4.9 mEq/L (ref 3.5–5.3)
Sodium: 130 mEq/L — ABNORMAL LOW (ref 135–145)

## 2014-08-27 LAB — GLUCOSE, CAPILLARY: GLUCOSE-CAPILLARY: 280 mg/dL — AB (ref 70–99)

## 2014-08-27 LAB — POCT GLYCOSYLATED HEMOGLOBIN (HGB A1C): HEMOGLOBIN A1C: 9.4

## 2014-08-27 NOTE — Patient Instructions (Addendum)
General Instructions: I will refer you to Endocrine (Diabetes doctor) to get your sugar under control  Please follow up in 3 months  I will get your glucose strips approved  Please looking into Tdap with Dr. Tommy Medal Please schedule retinal exam with [name]  Take care    Treatment Goals:  Goals (1 Years of Data) as of 08/27/14          As of Today 03/25/14 03/23/14 03/19/14 03/11/14     Blood Pressure   . Blood Pressure < 140/90   153/80 148/69 143/66      Lifestyle   . Prevent Falls           Result Component   . HDL > 40      27   . HEMOGLOBIN A1C < 7.0  9.4   9.2    . LDL CALC < 100      72   . LDL CALC < 100      72      Progress Toward Treatment Goals:  Treatment Goal 08/27/2014  Hemoglobin A1C unchanged  Blood pressure unable to assess  Prevent falls -    Self Care Goals & Plans:  Self Care Goal 08/27/2014  Manage my medications take my medicines as prescribed; bring my medications to every visit; refill my medications on time; follow the sick day instructions if I am sick  Monitor my health keep track of my blood glucose; bring my glucose meter and log to each visit; keep track of my blood pressure; bring my blood pressure log to each visit; keep track of my weight; check my feet daily  Eat healthy foods drink diet soda or water instead of juice or soda; eat more vegetables; eat foods that are low in salt; eat baked foods instead of fried foods; eat fruit for snacks and desserts; eat smaller portions  Be physically active find an activity I enjoy  Prevent falls -  Meeting treatment goals maintain the current self-care plan    Home Blood Glucose Monitoring 08/27/2014  Check my blood sugar 2 times a day  When to check my blood sugar before meals     Care Management & Community Referrals:  Referral 08/27/2014  Referrals made for care management support none needed  Referrals made to community resources none       Type 2 Diabetes Mellitus Type 2 diabetes  mellitus, often simply referred to as type 2 diabetes, is a long-lasting (chronic) disease. In type 2 diabetes, the pancreas does not make enough insulin (a hormone), the cells are less responsive to the insulin that is made (insulin resistance), or both. Normally, insulin moves sugars from food into the tissue cells. The tissue cells use the sugars for energy. The lack of insulin or the lack of normal response to insulin causes excess sugars to build up in the blood instead of going into the tissue cells. As a result, high blood sugar (hyperglycemia) develops. The effect of high sugar (glucose) levels can cause many complications. Type 2 diabetes was also previously called adult-onset diabetes, but it can occur at any age.  RISK FACTORS  A person is predisposed to developing type 2 diabetes if someone in the family has the disease and also has one or more of the following primary risk factors:  Overweight.  An inactive lifestyle.  A history of consistently eating high-calorie foods. Maintaining a normal weight and regular physical activity can reduce the chance of developing type 2 diabetes. SYMPTOMS  A person with type 2 diabetes may not show symptoms initially. The symptoms of type 2 diabetes appear slowly. The symptoms include:  Increased thirst (polydipsia).  Increased urination (polyuria).  Increased urination during the night (nocturia).  Weight loss. This weight loss may be rapid.  Frequent, recurring infections.  Tiredness (fatigue).  Weakness.  Vision changes, such as blurred vision.  Fruity smell to your breath.  Abdominal pain.  Nausea or vomiting.  Cuts or bruises which are slow to heal.  Tingling or numbness in the hands or feet. DIAGNOSIS Type 2 diabetes is frequently not diagnosed until complications of diabetes are present. Type 2 diabetes is diagnosed when symptoms or complications are present and when blood glucose levels are increased. Your blood glucose  level may be checked by one or more of the following blood tests:  A fasting blood glucose test. You will not be allowed to eat for at least 8 hours before a blood sample is taken.  A random blood glucose test. Your blood glucose is checked at any time of the day regardless of when you ate.  A hemoglobin A1c blood glucose test. A hemoglobin A1c test provides information about blood glucose control over the previous 3 months.  An oral glucose tolerance test (OGTT). Your blood glucose is measured after you have not eaten (fasted) for 2 hours and then after you drink a glucose-containing beverage. TREATMENT   You may need to take insulin or diabetes medicine daily to keep blood glucose levels in the desired range.  If you use insulin, you may need to adjust the dosage depending on the carbohydrates that you eat with each meal or snack. The treatment goal is to maintain the before meal blood sugar (preprandial glucose) level at 70-130 mg/dL. HOME CARE INSTRUCTIONS   Have your hemoglobin A1c level checked twice a year.  Perform daily blood glucose monitoring as directed by your health care provider.  Monitor urine ketones when you are ill and as directed by your health care provider.  Take your diabetes medicine or insulin as directed by your health care provider to maintain your blood glucose levels in the desired range.  Never run out of diabetes medicine or insulin. It is needed every day.  If you are using insulin, you may need to adjust the amount of insulin given based on your intake of carbohydrates. Carbohydrates can raise blood glucose levels but need to be included in your diet. Carbohydrates provide vitamins, minerals, and fiber which are an essential part of a healthy diet. Carbohydrates are found in fruits, vegetables, whole grains, dairy products, legumes, and foods containing added sugars.  Eat healthy foods. You should make an appointment to see a registered dietitian to help  you create an eating plan that is right for you.  Lose weight if you are overweight.  Carry a medical alert card or wear your medical alert jewelry.  Carry a 15-gram carbohydrate snack with you at all times to treat low blood glucose (hypoglycemia). Some examples of 15-gram carbohydrate snacks include:  Glucose tablets, 3 or 4.  Glucose gel, 15-gram tube.  Raisins, 2 tablespoons (24 grams).  Jelly beans, 6.  Animal crackers, 8.  Regular pop, 4 ounces (120 mL).  Gummy treats, 9.  Recognize hypoglycemia. Hypoglycemia occurs with blood glucose levels of 70 mg/dL and below. The risk for hypoglycemia increases when fasting or skipping meals, during or after intense exercise, and during sleep. Hypoglycemia symptoms can include:  Tremors or shakes.  Decreased ability to  concentrate.  Sweating.  Increased heart rate.  Headache.  Dry mouth.  Hunger.  Irritability.  Anxiety.  Restless sleep.  Altered speech or coordination.  Confusion.  Treat hypoglycemia promptly. If you are alert and able to safely swallow, follow the 15:15 rule:  Take 15-20 grams of rapid-acting glucose or carbohydrate. Rapid-acting options include glucose gel, glucose tablets, or 4 ounces (120 mL) of fruit juice, regular soda, or low-fat milk.  Check your blood glucose level 15 minutes after taking the glucose.  Take 15-20 grams more of glucose if the repeat blood glucose level is still 70 mg/dL or below.  Eat a meal or snack within 1 hour once blood glucose levels return to normal.  Be alert to feeling very thirsty and urinating more frequently than usual, which are early signs of hyperglycemia. An early awareness of hyperglycemia allows for prompt treatment. Treat hyperglycemia as directed by your health care provider.  Engage in at least 150 minutes of moderate-intensity physical activity a week, spread over at least 3 days of the week or as directed by your health care provider. In  addition, you should engage in resistance exercise at least 2 times a week or as directed by your health care provider. Try to spend no more than 90 minutes at one time inactive.  Adjust your medicine and food intake as needed if you start a new exercise or sport.  Follow your sick-day plan anytime you are unable to eat or drink as usual.  Do not use any tobacco products including cigarettes, chewing tobacco, or electronic cigarettes. If you need help quitting, ask your health care provider.  Limit alcohol intake to no more than 1 drink per day for nonpregnant women and 2 drinks per day for men. You should drink alcohol only when you are also eating food. Talk with your health care provider whether alcohol is safe for you. Tell your health care provider if you drink alcohol several times a week.  Keep all follow-up visits as directed by your health care provider. This is important.  Schedule an eye exam soon after the diagnosis of type 2 diabetes and then annually.  Perform daily skin and foot care. Examine your skin and feet daily for cuts, bruises, redness, nail problems, bleeding, blisters, or sores. A foot exam by a health care provider should be done annually.  Brush your teeth and gums at least twice a day and floss at least once a day. Follow up with your dentist regularly.  Share your diabetes management plan with your workplace or school.  Stay up-to-date with immunizations. It is recommended that people with diabetes who are over 36 years old get the pneumonia vaccine. In some cases, two separate shots may be given. Ask your health care provider if your pneumonia vaccination is up-to-date.  Learn to manage stress.  Obtain ongoing diabetes education and support as needed.  Participate in or seek rehabilitation as needed to maintain or improve independence and quality of life. Request a physical or occupational therapy referral if you are having foot or hand numbness, or  difficulties with grooming, dressing, eating, or physical activity. SEEK MEDICAL CARE IF:   You are unable to eat food or drink fluids for more than 6 hours.  You have nausea and vomiting for more than 6 hours.  Your blood glucose level is over 240 mg/dL.  There is a change in mental status.  You develop an additional serious illness.  You have diarrhea for more than 6 hours.  You have been sick or have had a fever for a couple of days and are not getting better.  You have pain during any physical activity.  SEEK IMMEDIATE MEDICAL CARE IF:  You have difficulty breathing.  You have moderate to large ketone levels. MAKE SURE YOU:  Understand these instructions.  Will watch your condition.  Will get help right away if you are not doing well or get worse. Document Released: 04/24/2005 Document Revised: 09/08/2013 Document Reviewed: 11/21/2011 [facility] Patient Information 2015 [place], [place]. This information is not intended to replace advice given to you by your health care provider. Make sure you discuss any questions you have with your health care provider.

## 2014-08-28 ENCOUNTER — Encounter: Payer: Self-pay | Admitting: Internal Medicine

## 2014-08-28 NOTE — Assessment & Plan Note (Signed)
Follows with ID

## 2014-08-28 NOTE — Assessment & Plan Note (Signed)
Will resch retinal exam Tdap he will ask ID about  Had Zoster  Will need prevnar 03/2015

## 2014-08-28 NOTE — Progress Notes (Signed)
[facility] Medicine Clinic Attending  Case discussed with Dr. McLean at the time of the visit.  We reviewed the resident's history and exam and pertinent patient test results.  I agree with the assessment, diagnosis, and plan of care documented in the resident's note. 

## 2014-08-28 NOTE — Progress Notes (Signed)
   Subjective:    Patient ID: Nathan [name], male    DOB: 1950-06-22, 64 y.o.   MRN: [id number]  HPI Comments: 64 y.o PMH DM 2 associated with PVD (HA1C 9.2 07/10/13 now 9.4), controlled HIV (highly resistant) (cd4 670, viral load wnl 03/11/2014), dyslipidemia (LDL 72 03/11/2014), HTN (BP 140/70 today), h/o right AKA with prosthetic leg, h/o colon polyps, GERD, h/o Hep B core Ab +, h/o MRSA, h/o MS, h/o PVD, emphysema per last PET scan results, recent history of syphillis with h/o +RPR, phantom limb pain (gets narcotics from Dr. Tommy Medal), EF 04/2013 60-65%.    He presents for f/u  1) Uncontrolled DM 2. He is taking Lantus 67 u qhs (though last visit disc'ed 73 units), prandial insulin taking 28 units tid. HA1C 9.4 today. Meter readings run from 140s(143)->400s (466) (mostly hyperglycemic).  He had had a hard time getting his meter strips from his pharmacy and has not been checking his sugars as much of giving himself insulin.   No h/o <70 values. He admits to diet noncompliance. He has the [employer] next EZ meter.  He is frustrated about not being able to get his glucose strips.  2) BP 140/70 today. On Lisinpril 40 mg qd 3) He reports seasonal allergies but does not know what medications to take which wont interfere with HIV meds so he will as Dr. Tommy Medal   Health maintenance -Due for Tdap (will disc with [facility]), Zoster (given RX and pt had), Prevnar (due 03/2015) -eye exam will resch retinal exam as he was due by 07/2014      Review of Systems  Respiratory: Negative for shortness of breath.   Cardiovascular: Negative for chest pain.  Gastrointestinal: Negative for abdominal pain.       Objective:   Physical Exam  Constitutional: He is oriented to person, place, and time. Vital signs are normal. He appears well-developed and well-nourished. He is cooperative.  HENT:  Head: Normocephalic and atraumatic.  Mouth/Throat: He has dentures.  Eyes: Conjunctivae are normal. Right eye exhibits no  discharge. Left eye exhibits no discharge. No scleral icterus.  Cardiovascular: Normal rate, regular rhythm and normal heart sounds.   No murmur heard. Pulmonary/Chest: Effort normal and breath sounds normal.  Abdominal: Soft. Bowel sounds are normal. He exhibits no distension. There is no tenderness.  Musculoskeletal: He exhibits no edema.  Neurological: He is alert and oriented to person, place, and time.  BL walks with lump due to right AKA with prothesis   Skin: Skin is warm, dry and intact. No rash noted.  Psychiatric: He has a normal mood and affect. His speech is normal and behavior is normal. Judgment and thought content normal. Cognition and memory are normal.  Nursing note and vitals reviewed.         Assessment & Plan:  F/u in 3 months  Will refer to Dr. Buddy Duty

## 2014-08-28 NOTE — Assessment & Plan Note (Signed)
BP Readings from Last 3 Encounters:  08/27/14 140/70  03/25/14 153/80  03/23/14 148/69    Lab Results  Component Value Date   NA 130* 08/27/2014   K 4.9 08/27/2014   CREATININE 1.02 08/27/2014    Assessment: Blood pressure control: mildly elevated Progress toward BP goal:  at goal Comments: none  Plan: Medications:  continue current medications Lisinopril 40 mg  Other plans: check BMET today, f/u in 3 months

## 2014-08-28 NOTE — Assessment & Plan Note (Signed)
Lab Results  Component Value Date   HGBA1C 9.4 08/27/2014   HGBA1C 9.2 03/19/2014   HGBA1C 7.5 10/09/2013     Assessment: Diabetes control: poor control (HgbA1C >9%) Progress toward A1C goal:  unchanged Comments: difficult to control and on high dose insulin   Plan: Medications:  continue current medications for now will continue 67 units qhs, 28 u tid. Will refer to endocrinology to get under better control Home glucose monitoring: Frequency: 2 times a day Timing: before meals Instruction/counseling given: reminded to get eye exam Other plans: f/u in 3 months, will refer to endocrine

## 2014-09-09 ENCOUNTER — Other Ambulatory Visit (HOSPITAL_COMMUNITY)
Admission: RE | Admit: 2014-09-09 | Discharge: 2014-09-09 | Disposition: A | Discharge status: Home / Self Care | Payer: Medicare Other | Source: Ambulatory Visit | Attending: Infectious Disease | Admitting: Infectious Disease

## 2014-09-09 ENCOUNTER — Other Ambulatory Visit: Payer: Medicare Other

## 2014-09-09 DIAGNOSIS — Z113 Encounter for screening for infections with a predominantly sexual mode of transmission: Secondary | ICD-10-CM | POA: Insufficient documentation

## 2014-09-09 DIAGNOSIS — E785 Hyperlipidemia, unspecified: Secondary | ICD-10-CM

## 2014-09-09 DIAGNOSIS — Z79899 Other long term (current) drug therapy: Secondary | ICD-10-CM

## 2014-09-09 DIAGNOSIS — B20 Human immunodeficiency virus [HIV] disease: Secondary | ICD-10-CM

## 2014-09-09 DIAGNOSIS — R238 Other skin changes: Secondary | ICD-10-CM

## 2014-09-09 LAB — CBC WITH DIFFERENTIAL/PLATELET
BASOS ABS: 0 10*3/uL (ref 0.0–0.1)
BASOS PCT: 0 % (ref 0–1)
EOS ABS: 0.2 10*3/uL (ref 0.0–0.7)
EOS PCT: 4 % (ref 0–5)
HCT: 33 % — ABNORMAL LOW (ref 39.0–52.0)
Hemoglobin: 11.1 g/dL — ABNORMAL LOW (ref 13.0–17.0)
Lymphocytes Relative: 52 % — ABNORMAL HIGH (ref 12–46)
Lymphs Abs: 2.9 10*3/uL (ref 0.7–4.0)
MCH: 32.8 pg (ref 26.0–34.0)
MCHC: 33.6 g/dL (ref 30.0–36.0)
MCV: 97.6 fL (ref 78.0–100.0)
MONO ABS: 0.4 10*3/uL (ref 0.1–1.0)
MPV: 8.6 fL (ref 8.6–12.4)
Monocytes Relative: 7 % (ref 3–12)
Neutro Abs: 2.1 10*3/uL (ref 1.7–7.7)
Neutrophils Relative %: 37 % — ABNORMAL LOW (ref 43–77)
PLATELETS: 213 10*3/uL (ref 150–400)
RBC: 3.38 MIL/uL — ABNORMAL LOW (ref 4.22–5.81)
RDW: 14.8 % (ref 11.5–15.5)
WBC: 5.6 10*3/uL (ref 4.0–10.5)

## 2014-09-09 LAB — LIPID PANEL
CHOL/HDL RATIO: 5.5 ratio
Cholesterol: 133 mg/dL (ref 0–200)
HDL: 24 mg/dL — ABNORMAL LOW (ref >=40)
LDL Cholesterol: 55 mg/dL (ref 0–99)
Triglycerides: 269 mg/dL — ABNORMAL HIGH (ref <150)
VLDL: 54 mg/dL — ABNORMAL HIGH (ref 0–40)

## 2014-09-09 LAB — COMPLETE METABOLIC PANEL WITH GFR
ALBUMIN: 4.3 g/dL (ref 3.5–5.2)
ALK PHOS: 57 U/L (ref 39–117)
ALT: 57 U/L — ABNORMAL HIGH (ref 0–53)
AST: 41 U/L — ABNORMAL HIGH (ref 0–37)
BUN: 8 mg/dL (ref 6–23)
CO2: 22 meq/L (ref 19–32)
Calcium: 9.1 mg/dL (ref 8.4–10.5)
Chloride: 101 mEq/L (ref 96–112)
Creat: 0.89 mg/dL (ref 0.50–1.35)
GLUCOSE: 257 mg/dL — AB (ref 70–99)
POTASSIUM: 4.3 meq/L (ref 3.5–5.3)
Sodium: 135 mEq/L (ref 135–145)
Total Bilirubin: 0.5 mg/dL (ref 0.2–1.2)
Total Protein: 7.4 g/dL (ref 6.0–8.3)

## 2014-09-09 NOTE — Addendum Note (Signed)
Addended by: Dolan Amen D on: 09/09/2014 11:47 AM   Modules accepted: Orders

## 2014-09-10 LAB — HIV-1 RNA QUANT-NO REFLEX-BLD: HIV 1 RNA Quant: <20 copies/mL (ref <20)

## 2014-09-10 LAB — T-HELPER CELL (CD4) - (RCID CLINIC ONLY)
CD4 % Helper T Cell: 20 % — ABNORMAL LOW (ref 33–55)
CD4 T Cell Abs: 550 /uL (ref 400–2700)

## 2014-09-10 LAB — RPR

## 2014-09-10 LAB — URINE CYTOLOGY ANCILLARY ONLY
Chlamydia: NEGATIVE
NEISSERIA GONORRHEA: NEGATIVE

## 2014-09-16 ENCOUNTER — Telehealth: Payer: Self-pay | Admitting: *Deleted

## 2014-09-16 DIAGNOSIS — R52 Pain, unspecified: Secondary | ICD-10-CM

## 2014-09-16 MED ORDER — OXYCODONE HCL 10 MG PO TABS: 10.0000 mg | ORAL_TABLET | Freq: Three times a day (TID) | ORAL | Status: DC | PRN

## 2014-09-16 NOTE — Telephone Encounter (Signed)
Pt requested to be called when rx is ready to be picked up.  Lloyd Huger informed.

## 2014-09-22 ENCOUNTER — Telehealth: Payer: Self-pay | Admitting: *Deleted

## 2014-09-22 ENCOUNTER — Telehealth: Payer: Self-pay | Admitting: Dietician

## 2014-09-22 NOTE — Telephone Encounter (Signed)
Call from patient returning call from Dr. Aundra Dubin about his Prosthetic supplies.  Call to [employer] to ask what supplies patient will need. Person there said th write prescription for Prosthetic Supplies and they will take care of the the request.  Message to be sent to Dr. Aundra Dubin.   Sander Nephew, RN 09/22/2014 9:22 AM

## 2014-09-22 NOTE — Telephone Encounter (Signed)
Call to patient to confirm appointment for 09/23/14 at 10:30 lmtcb

## 2014-09-23 ENCOUNTER — Encounter: Payer: Self-pay | Admitting: Infectious Disease

## 2014-09-23 ENCOUNTER — Other Ambulatory Visit: Payer: Self-pay | Admitting: Dietician

## 2014-09-23 ENCOUNTER — Encounter: Payer: Self-pay | Admitting: *Deleted

## 2014-09-23 ENCOUNTER — Encounter: Payer: Self-pay | Admitting: Dietician

## 2014-09-23 ENCOUNTER — Ambulatory Visit (INDEPENDENT_AMBULATORY_CARE_PROVIDER_SITE_OTHER): Payer: Medicare Other | Admitting: Infectious Disease

## 2014-09-23 ENCOUNTER — Ambulatory Visit: Payer: Medicare Other | Admitting: Dietician

## 2014-09-23 VITALS — BP 133/71 | HR 79 | Temp 97.7°F | Wt 172.0 lb

## 2014-09-23 DIAGNOSIS — E1142 Type 2 diabetes mellitus with diabetic polyneuropathy: Secondary | ICD-10-CM | POA: Diagnosis not present

## 2014-09-23 DIAGNOSIS — G629 Polyneuropathy, unspecified: Secondary | ICD-10-CM

## 2014-09-23 DIAGNOSIS — A539 Syphilis, unspecified: Secondary | ICD-10-CM

## 2014-09-23 DIAGNOSIS — B20 Human immunodeficiency virus [HIV] disease: Secondary | ICD-10-CM

## 2014-09-23 DIAGNOSIS — I10 Essential (primary) hypertension: Secondary | ICD-10-CM | POA: Diagnosis not present

## 2014-09-23 DIAGNOSIS — E785 Hyperlipidemia, unspecified: Secondary | ICD-10-CM | POA: Diagnosis present

## 2014-09-23 LAB — HM DIABETES EYE EXAM

## 2014-09-23 NOTE — Progress Notes (Signed)
  Subjective:    Patient ID: Nathan [name], male    DOB: 07/22/1950, 64 y.o.   MRN: [id number]  HPI   Nathan [name] is a 64 y.o. male whose HIV has been perfectly suppressed with salvage regimen of  twice daily combivir, isentress and once daily viread with undetectable viral load and health cd4 count. (he has EXTENSIVE R with R to all NNRTI, R to nearly all PI except DRV, with still activity from TNF, AZT and the isentress .  Lab Results  Component Value Date   HIV1RNAQUANT <20 09/09/2014   Lab Results  Component Value Date   CD4TABS 550 09/09/2014   CD4TABS 670 03/11/2014   CD4TABS 632 05/29/2013    Syphilis titer is now nonreactive after treatment.  Review of Systems  Constitutional: Negative for chills, diaphoresis, activity change, appetite change and unexpected weight change.  HENT: Negative for sinus pressure, sneezing and trouble swallowing.   Eyes: Negative for photophobia and visual disturbance.  Respiratory: Negative for chest tightness and stridor.   Cardiovascular: Negative for palpitations and leg swelling.  Gastrointestinal: Negative for constipation, blood in stool, abdominal distention and anal bleeding.  Genitourinary: Negative for dysuria, hematuria, flank pain and difficulty urinating.  Musculoskeletal: Negative for myalgias, back pain, joint swelling, arthralgias and gait problem.  Skin: Negative for color change, pallor and wound.  Neurological: Negative for dizziness, tremors, weakness and light-headedness.  Hematological: Negative for adenopathy. Does not bruise/bleed easily.  Psychiatric/Behavioral: Negative for behavioral problems, confusion, sleep disturbance, dysphoric mood, decreased concentration and agitation.       Objective:   Physical Exam  Constitutional: He is oriented to person, place, and time. He appears well-developed and well-nourished. No distress.  HENT:  Head: Normocephalic and atraumatic.  Mouth/Throat: Oropharynx is clear and  moist. No oropharyngeal exudate.  Eyes: Conjunctivae and EOM are normal. No scleral icterus.  Neck: Normal range of motion. Neck supple.  Cardiovascular: Normal rate and regular rhythm.   Pulmonary/Chest: Effort normal. No respiratory distress. He has no wheezes.  Abdominal: He exhibits no distension.  Musculoskeletal: He exhibits no edema or tenderness.  Neurological: He is alert and oriented to person, place, and time.  Skin: Skin is warm and dry. He is not diaphoretic. No erythema. No pallor.  Psychiatric: He has a normal mood and affect. His behavior is normal. Judgment and thought content normal.        Assessment & Plan:    HIV: highly R virus.  --continue current regimen, but then change to BID AZT and TAF/3TC when TAF regimen covered I spent greater than 25 minutes with the patient including greater than 50% of time in face to face counsel of the patient regarding his HIV and regimens that we'll change him to an future.and in coordination of their care.  Disseminated syphilis with hepatitis: RPR NR now    DM: followed closely in IM clinic   Neuropathic pain and phantom limb pain: continue  oxycodone tid prn.   HTN: followed by PCP

## 2014-09-23 NOTE — Progress Notes (Signed)
Retinal images done and transmitted.  

## 2014-09-24 ENCOUNTER — Other Ambulatory Visit: Payer: Self-pay | Admitting: Infectious Disease

## 2014-09-24 DIAGNOSIS — B20 Human immunodeficiency virus [HIV] disease: Secondary | ICD-10-CM

## 2014-09-25 ENCOUNTER — Encounter: Payer: Self-pay | Admitting: *Deleted

## 2014-10-14 ENCOUNTER — Encounter: Payer: Self-pay | Admitting: Family

## 2014-10-19 ENCOUNTER — Encounter (HOSPITAL_COMMUNITY): Payer: Self-pay

## 2014-10-19 ENCOUNTER — Other Ambulatory Visit: Payer: Self-pay | Admitting: Licensed Clinical Social Worker

## 2014-10-19 ENCOUNTER — Ambulatory Visit: Payer: Self-pay | Admitting: Family

## 2014-10-19 DIAGNOSIS — R52 Pain, unspecified: Secondary | ICD-10-CM

## 2014-10-19 MED ORDER — OXYCODONE HCL 10 MG PO TABS: 10.0000 mg | ORAL_TABLET | Freq: Three times a day (TID) | ORAL | Status: DC | PRN

## 2014-11-04 ENCOUNTER — Other Ambulatory Visit: Payer: Self-pay | Admitting: Internal Medicine

## 2014-11-17 ENCOUNTER — Other Ambulatory Visit: Payer: Self-pay | Admitting: *Deleted

## 2014-11-17 DIAGNOSIS — R52 Pain, unspecified: Secondary | ICD-10-CM

## 2014-11-17 MED ORDER — OXYCODONE HCL 10 MG PO TABS: 10.0000 mg | ORAL_TABLET | Freq: Three times a day (TID) | ORAL | Status: DC | PRN

## 2014-11-20 ENCOUNTER — Other Ambulatory Visit: Payer: Self-pay | Admitting: Internal Medicine

## 2014-12-08 ENCOUNTER — Other Ambulatory Visit: Payer: Self-pay | Admitting: Internal Medicine

## 2014-12-11 ENCOUNTER — Other Ambulatory Visit: Payer: Self-pay | Admitting: Internal Medicine

## 2014-12-11 MED ORDER — LISINOPRIL 40 MG PO TABS: 40.0000 mg | ORAL_TABLET | Freq: Every day | ORAL | Status: DC

## 2014-12-11 NOTE — Telephone Encounter (Signed)
Patient states he has been trying to get his refill x 1 week for his lisinopril  @ [facility] on [address].  Patient states his now out of his BP meds.

## 2014-12-11 NOTE — Telephone Encounter (Signed)
This was an escript error but has now been sent to attending for approval

## 2014-12-17 ENCOUNTER — Other Ambulatory Visit: Payer: Self-pay | Admitting: *Deleted

## 2014-12-17 DIAGNOSIS — R52 Pain, unspecified: Secondary | ICD-10-CM

## 2014-12-17 MED ORDER — OXYCODONE HCL 10 MG PO TABS: 10.0000 mg | ORAL_TABLET | Freq: Three times a day (TID) | ORAL | Status: DC | PRN

## 2014-12-17 NOTE — Telephone Encounter (Signed)
Please call the pt when ready for pick up.

## 2014-12-18 ENCOUNTER — Other Ambulatory Visit: Payer: Self-pay | Admitting: Infectious Disease

## 2014-12-18 ENCOUNTER — Other Ambulatory Visit: Payer: Self-pay | Admitting: *Deleted

## 2014-12-18 DIAGNOSIS — R52 Pain, unspecified: Secondary | ICD-10-CM

## 2014-12-18 MED ORDER — OXYCODONE HCL 10 MG PO TABS: 10.0000 mg | ORAL_TABLET | Freq: Three times a day (TID) | ORAL | Status: DC | PRN

## 2015-01-18 ENCOUNTER — Other Ambulatory Visit: Payer: Self-pay | Admitting: Infectious Disease

## 2015-01-18 ENCOUNTER — Other Ambulatory Visit: Payer: Self-pay | Admitting: *Deleted

## 2015-01-18 ENCOUNTER — Other Ambulatory Visit: Payer: Self-pay | Admitting: Internal Medicine

## 2015-01-18 DIAGNOSIS — R52 Pain, unspecified: Secondary | ICD-10-CM

## 2015-01-18 DIAGNOSIS — B20 Human immunodeficiency virus [HIV] disease: Secondary | ICD-10-CM

## 2015-01-18 MED ORDER — OXYCODONE HCL 10 MG PO TABS: 10.0000 mg | ORAL_TABLET | Freq: Three times a day (TID) | ORAL | Status: DC | PRN

## 2015-01-19 ENCOUNTER — Other Ambulatory Visit: Payer: Self-pay | Admitting: *Deleted

## 2015-01-19 DIAGNOSIS — E1142 Type 2 diabetes mellitus with diabetic polyneuropathy: Secondary | ICD-10-CM

## 2015-01-20 MED ORDER — METFORMIN HCL 1000 MG PO TABS: 1000.0000 mg | ORAL_TABLET | Freq: Two times a day (BID) | ORAL | Status: DC

## 2015-01-20 MED ORDER — CLOPIDOGREL BISULFATE 75 MG PO TABS: 75.0000 mg | ORAL_TABLET | Freq: Every day | ORAL | Status: DC

## 2015-01-26 ENCOUNTER — Emergency Department (HOSPITAL_COMMUNITY)
Admission: EM | Admit: 2015-01-26 | Discharge: 2015-01-26 | Disposition: A | Discharge status: Home / Self Care | Payer: Medicare Other | Attending: Emergency Medicine | Admitting: Emergency Medicine

## 2015-01-26 ENCOUNTER — Encounter (HOSPITAL_COMMUNITY): Payer: Self-pay | Admitting: Emergency Medicine

## 2015-01-26 ENCOUNTER — Emergency Department (HOSPITAL_COMMUNITY): Payer: Medicare Other

## 2015-01-26 DIAGNOSIS — Y9289 Other specified places as the place of occurrence of the external cause: Secondary | ICD-10-CM | POA: Diagnosis not present

## 2015-01-26 DIAGNOSIS — Y9389 Activity, other specified: Secondary | ICD-10-CM | POA: Insufficient documentation

## 2015-01-26 DIAGNOSIS — E785 Hyperlipidemia, unspecified: Secondary | ICD-10-CM | POA: Insufficient documentation

## 2015-01-26 DIAGNOSIS — I10 Essential (primary) hypertension: Secondary | ICD-10-CM | POA: Insufficient documentation

## 2015-01-26 DIAGNOSIS — E119 Type 2 diabetes mellitus without complications: Secondary | ICD-10-CM | POA: Diagnosis not present

## 2015-01-26 DIAGNOSIS — B20 Human immunodeficiency virus [HIV] disease: Secondary | ICD-10-CM | POA: Diagnosis not present

## 2015-01-26 DIAGNOSIS — Z794 Long term (current) use of insulin: Secondary | ICD-10-CM | POA: Diagnosis not present

## 2015-01-26 DIAGNOSIS — S20212A Contusion of left front wall of thorax, initial encounter: Secondary | ICD-10-CM | POA: Diagnosis not present

## 2015-01-26 DIAGNOSIS — Z8614 Personal history of Methicillin resistant Staphylococcus aureus infection: Secondary | ICD-10-CM | POA: Diagnosis not present

## 2015-01-26 DIAGNOSIS — W010XXA Fall on same level from slipping, tripping and stumbling without subsequent striking against object, initial encounter: Secondary | ICD-10-CM | POA: Insufficient documentation

## 2015-01-26 DIAGNOSIS — Z87891 Personal history of nicotine dependence: Secondary | ICD-10-CM | POA: Insufficient documentation

## 2015-01-26 DIAGNOSIS — Z8701 Personal history of pneumonia (recurrent): Secondary | ICD-10-CM | POA: Insufficient documentation

## 2015-01-26 DIAGNOSIS — Y998 Other external cause status: Secondary | ICD-10-CM | POA: Insufficient documentation

## 2015-01-26 DIAGNOSIS — K219 Gastro-esophageal reflux disease without esophagitis: Secondary | ICD-10-CM | POA: Insufficient documentation

## 2015-01-26 DIAGNOSIS — Z86010 Personal history of colonic polyps: Secondary | ICD-10-CM | POA: Insufficient documentation

## 2015-01-26 DIAGNOSIS — Z8659 Personal history of other mental and behavioral disorders: Secondary | ICD-10-CM | POA: Insufficient documentation

## 2015-01-26 DIAGNOSIS — Z87440 Personal history of urinary (tract) infections: Secondary | ICD-10-CM | POA: Diagnosis not present

## 2015-01-26 DIAGNOSIS — Z79899 Other long term (current) drug therapy: Secondary | ICD-10-CM | POA: Insufficient documentation

## 2015-01-26 DIAGNOSIS — Z862 Personal history of diseases of the blood and blood-forming organs and certain disorders involving the immune mechanism: Secondary | ICD-10-CM | POA: Diagnosis not present

## 2015-01-26 DIAGNOSIS — R0781 Pleurodynia: Secondary | ICD-10-CM | POA: Diagnosis not present

## 2015-01-26 DIAGNOSIS — J45909 Unspecified asthma, uncomplicated: Secondary | ICD-10-CM | POA: Insufficient documentation

## 2015-01-26 DIAGNOSIS — S299XXA Unspecified injury of thorax, initial encounter: Secondary | ICD-10-CM | POA: Diagnosis present

## 2015-01-26 NOTE — Discharge Instructions (Signed)

## 2015-01-26 NOTE — ED Provider Notes (Signed)
CSN: [id number]     Arrival date & time 01/26/15  1424 History   First MD Initiated Contact with Patient 01/26/15 1920     Chief Complaint  Patient presents with  . Fall  . rib pain      (Consider location/radiation/quality/duration/timing/severity/associated sxs/prior Treatment) Patient is a 64 y.o. male presenting with chest pain.  Chest Pain Pain location:  L lateral chest Pain quality: stabbing   Pain radiates to:  Does not radiate Pain radiates to the back: no   Pain severity:  Severe Onset quality:  Sudden Timing:  Constant Progression:  Unchanged Chronicity:  New Context: breathing   Relieved by:  Nothing Worsened by:  Certain positions, deep breathing and movement Associated symptoms: no abdominal pain, no dizziness, no lower extremity edema, no nausea, no shortness of breath and not vomiting     Past Medical History  Diagnosis Date  . Diabetes mellitus   . HIV infection     undetectable viral load and CD4 ct 667 as of 11/2011  . Colon polyps     noted previous colonoscopy [facility]  . Hyperlipidemia   . Multiple sclerosis     in remission as of 11/2011 (diagnosed late 1980s)  . GERD (gastroesophageal reflux disease)   . Hypertension   . MRSA (methicillin resistant Staphylococcus aureus)   . PCP (pneumocystis jiroveci pneumonia)     2002  . History of syphilis     noted [facility] records  . Asthma     per 2003 [facility] pulm records pfts   . DDD (degenerative disc disease)     cervical spine  . PVD (peripheral vascular disease)     Left Stent 07/02/2008  . Depression   . Pneumonia   . UTI (urinary tract infection)   . Clotting disorder   . Blood dyscrasia     HIV  . Fever     unknwon origin  . Hep C w/o coma, chronic    Past Surgical History  Procedure Laterality Date  . Other surgical history      right lower ext AKA with prothesis   . Other surgical history      left left with stents (Dr. Seward Speck)  . Other surgical history      2003 colonoscopy 5  mm polyp transverse colon; (2)69m  polyps in rectum-hyperplastic  . Above knee leg amputation      Right Leg  . Lower extremity angiogram Left 12/26/2011    Procedure: LOWER EXTREMITY ANGIOGRAM;  Surgeon: VSerafina Mitchell MD;  [facility];  Service: Cardiovascular;  Laterality: Left;   Family History  Problem Relation Age of Onset  . Diabetes Mother   . Coronary artery disease Mother   . Cancer Brother     colon caner stage 4 as of 11/2011 (unknown age of onset)  . Prostate cancer Brother   . Coronary artery disease Father    Social History  Substance Use Topics  . Smoking status: Former Smoker    Quit date: 05/09/2007  . Smokeless tobacco: Never Used  . Alcohol Use: No    Review of Systems  Respiratory: Negative for shortness of breath.   Cardiovascular: Positive for chest pain.  Gastrointestinal: Negative for nausea, vomiting and abdominal pain.  Neurological: Negative for dizziness.  All other systems reviewed and are negative.     Allergies  Sulfonamide derivatives  Home Medications   Prior to Admission medications   Medication Sig Start Date End Date Taking? Authorizing Provider  atorvastatin (LIPITOR) 40 MG tablet TAKE 1 TABLET BY MOUTH DAILY 01/18/15   Truman Hayward, MD  BD PEN NEEDLE NANO U/F 32G X 4 MM MISC USE AS DIRECTED FOUR TIMES DAILY 01/31/13   Nino Glow McLean-Scocozza, MD  Blood Glucose Monitoring Suppl (BAYER CONTOUR MONITOR) W/DEVICE KIT Use to check blood sugar as instructed up to 4 times a day 07/11/11   Bartholomew Crews, MD  clopidogrel (PLAVIX) 75 MG tablet Take 1 tablet (75 mg total) by mouth daily. 01/20/15   Jule Ser, DO  glucose blood (BAYER CONTOUR NEXT TEST) test strip Four times daily before meals and at bedtime, insulin dependent, ICD-10 CODE E11.42 08/12/14   Nino Glow McLean-Scocozza, MD  insulin glargine (LANTUS) 100 UNIT/ML injection Inject 0.73 mLs (73 Units total) into the skin at bedtime. Patient taking differently:  Inject 67 Units into the skin at bedtime.  03/19/14   Nino Glow McLean-Scocozza, MD  insulin lispro (HUMALOG KWIKPEN) 100 UNIT/ML KiwkPen 26 three times a day with meals Patient taking differently: 28 Units. 26 three times a day with meals 03/19/14   Nino Glow McLean-Scocozza, MD  Insulin Pen Needle (PEN NEEDLES 3/16") 31G X 5 MM MISC 1 application by Does not apply route 4 (four) times daily. Use to inject insulin twice daily Dx code 250.00 11/02/11   Bartholomew Crews, MD  ISENTRESS 400 MG tablet TAKE 1 TABLET BY MOUTH TWICE DAILY 01/18/15   Truman Hayward, MD  lamiVUDine-zidovudine (COMBIVIR) 150-300 MG per tablet TAKE 1 TABLET BY MOUTH TWICE DAILY 01/18/15   Truman Hayward, MD  Lancets MISC Use to Test Blood Sugar Three Times Daily. Dx Code: 250.02 03/01/12 03/19/14  Madilyn Fireman, MD  LANTUS SOLOSTAR 100 UNIT/ML Solostar Pen  02/10/14   Historical Provider, MD  lisinopril (PRINIVIL,ZESTRIL) 40 MG tablet Take 1 tablet (40 mg total) by mouth daily. 12/11/14   Axel Filler, MD  metFORMIN (GLUCOPHAGE) 1000 MG tablet Take 1 tablet (1,000 mg total) by mouth 2 (two) times daily with a meal. 01/20/15   Jule Ser, DO  omeprazole (PRILOSEC) 40 MG capsule TAKE 1 CAPSULE BY MOUTH DAILY 01/19/15   Jule Ser, DO  Oxycodone HCl 10 MG TABS Take 1 tablet (10 mg total) by mouth 3 (three) times daily as needed. 01/18/15   Thayer Headings, MD  VIREAD 300 MG tablet TAKE 1 TABLET BY MOUTH DAILY 01/18/15   Truman Hayward, MD   BP 137/62 mmHg  Pulse 110  Temp(Src) 98.3 F (36.8 C) (Oral)  Resp 16  SpO2 95% Physical Exam  Constitutional: He is oriented to person, place, and time. He appears well-developed and well-nourished.  HENT:  Head: Normocephalic and atraumatic.  Eyes: Conjunctivae and EOM are normal.  Neck: Normal range of motion. Neck supple.  Cardiovascular: Normal rate, regular rhythm and normal heart sounds.   Pulmonary/Chest: Effort normal and breath sounds normal. No  respiratory distress. He exhibits tenderness (L lateral chest).  Abdominal: He exhibits no distension. There is no tenderness. There is no rebound and no guarding.  Musculoskeletal: Normal range of motion.  Neurological: He is alert and oriented to person, place, and time.  Skin: Skin is warm and dry.  Vitals reviewed.   ED Course  Procedures (including critical care time) Labs Review Labs Reviewed - No data to display  Imaging Review Dg Ribs Unilateral W/chest Left  01/26/2015   CLINICAL DATA:  Fall. Pt sts left lower sided rib pain after  trip and fall today landing on a hand rail on his left ribs; pt sts worse with movement and positioning. Pain marked with a BB. Pt states pain starts posterior and radiates anteriorly.  EXAM: LEFT RIBS AND CHEST - 3+ VIEW  COMPARISON:  08/28/2013  FINDINGS: No fracture or other bone lesions are seen involving the ribs. There is no evidence of pneumothorax or pleural effusion. Both lungs are clear. Heart size and mediastinal contours are within normal limits. Atheromatous aorta.  IMPRESSION: Negative.   Electronically Signed   By: Lucrezia Europe M.D.   On: 01/26/2015 16:01   I have personally reviewed and evaluated these images and lab results as part of my medical decision-making.   EKG Interpretation None      MDM   Final diagnoses:  Chest wall contusion, left, initial encounter    64 y.o. male with pertinent PMH as above presents with mechanical fall and L lateral chest wall pain.  Historically and by exam consistent with contusion.  XR unremarkable.  Pt not dyspneic, tachycardia not present on my exam and likely pain mediated.  Strict return precautions given for PNA and occult rib fx.  DC home in stable condition.    I have reviewed all laboratory and imaging studies if ordered as above  1. Chest wall contusion, left, initial encounter         Debby Freiberg, MD 01/26/15 [phone]

## 2015-01-26 NOTE — ED Notes (Signed)
Pt sts left sided rib pain after trip and fall today; pt sts worse with movement and positioning

## 2015-02-16 ENCOUNTER — Other Ambulatory Visit: Payer: Self-pay | Admitting: Infectious Disease

## 2015-02-16 DIAGNOSIS — R52 Pain, unspecified: Secondary | ICD-10-CM

## 2015-02-16 MED ORDER — OXYCODONE HCL 10 MG PO TABS: 10.0000 mg | ORAL_TABLET | Freq: Three times a day (TID) | ORAL | Status: DC | PRN

## 2015-02-16 NOTE — Addendum Note (Signed)
Addended by: Hulan Fray on: 02/16/2015 07:38 PM   Modules accepted: Orders

## 2015-03-02 ENCOUNTER — Ambulatory Visit (HOSPITAL_COMMUNITY)
Admission: RE | Admit: 2015-03-02 | Discharge: 2015-03-02 | Disposition: A | Discharge status: Home / Self Care | Payer: Medicare Other | Source: Ambulatory Visit | Attending: Internal Medicine | Admitting: Internal Medicine

## 2015-03-02 ENCOUNTER — Ambulatory Visit (INDEPENDENT_AMBULATORY_CARE_PROVIDER_SITE_OTHER): Payer: Medicare Other | Admitting: Internal Medicine

## 2015-03-02 ENCOUNTER — Other Ambulatory Visit: Payer: Self-pay | Admitting: Internal Medicine

## 2015-03-02 ENCOUNTER — Encounter: Payer: Self-pay | Admitting: Internal Medicine

## 2015-03-02 VITALS — BP 156/56 | HR 72 | Temp 97.6°F | Ht 73.0 in | Wt 162.7 lb

## 2015-03-02 DIAGNOSIS — E1165 Type 2 diabetes mellitus with hyperglycemia: Secondary | ICD-10-CM

## 2015-03-02 DIAGNOSIS — M79651 Pain in right thigh: Secondary | ICD-10-CM | POA: Diagnosis not present

## 2015-03-02 DIAGNOSIS — T8789 Other complications of amputation stump: Principal | ICD-10-CM

## 2015-03-02 DIAGNOSIS — M79604 Pain in right leg: Secondary | ICD-10-CM | POA: Diagnosis not present

## 2015-03-02 DIAGNOSIS — Y835 Amputation of limb(s) as the cause of abnormal reaction of the patient, or of later complication, without mention of misadventure at the time of the procedure: Secondary | ICD-10-CM | POA: Insufficient documentation

## 2015-03-02 DIAGNOSIS — G546 Phantom limb syndrome with pain: Secondary | ICD-10-CM | POA: Diagnosis not present

## 2015-03-02 DIAGNOSIS — M79609 Pain in unspecified limb: Secondary | ICD-10-CM

## 2015-03-02 DIAGNOSIS — E1142 Type 2 diabetes mellitus with diabetic polyneuropathy: Secondary | ICD-10-CM | POA: Diagnosis not present

## 2015-03-02 DIAGNOSIS — Z89611 Acquired absence of right leg above knee: Secondary | ICD-10-CM

## 2015-03-02 DIAGNOSIS — M79661 Pain in right lower leg: Secondary | ICD-10-CM

## 2015-03-02 LAB — POCT GLYCOSYLATED HEMOGLOBIN (HGB A1C): HEMOGLOBIN A1C: 9.2

## 2015-03-02 LAB — GLUCOSE, CAPILLARY: GLUCOSE-CAPILLARY: 248 mg/dL — AB (ref 65–99)

## 2015-03-02 MED ORDER — PREGABALIN 50 MG PO CAPS: 50.0000 mg | ORAL_CAPSULE | Freq: Three times a day (TID) | ORAL | Status: DC

## 2015-03-02 NOTE — Patient Instructions (Signed)
Mr. Pollitt it was nice meeting you today.  -Start taking Lyrica 50 mg three times a day for your leg pain  -I have ordered some studies and will call you when the results come back.

## 2015-03-04 ENCOUNTER — Other Ambulatory Visit: Payer: Self-pay | Admitting: Internal Medicine

## 2015-03-04 DIAGNOSIS — M79661 Pain in right lower leg: Secondary | ICD-10-CM

## 2015-03-04 NOTE — Progress Notes (Signed)
Patient ID: Nathan [name], male   DOB: 1950/07/12, 64 y.o.   MRN: [id number]   Subjective:   Patient ID: Nathan [name] male   DOB: 1950/09/05 64 y.o.   MRN: [id number]  HPI: Nathan [name] is a 64 y.o. M with a PMHx of conditions listed below presenting to the clinic with RLE AKA stump pain. Patient states his amputation surgery was 7 years ago and he never had any problems in the past. States for the past 10 days he has been experiencing a "shocking pain" in the stump that radiates up the lateral aspect of his thigh. Reports that the pain has been getting progressively worse. States his prosthetic leg fits him correctly and he never had any problems in the past. Denies any history of injury to the area. Patient does have a history of MS and states it is currently in remission. He is a former smoker, quit 7 years ago. Reports being on Plavix due to history of blood clots in the contralateral leg.     Past Medical History  Diagnosis Date  . Diabetes mellitus   . HIV infection ([place])     undetectable viral load and CD4 ct 667 as of 11/2011  . Colon polyps     noted previous colonoscopy [facility]  . Hyperlipidemia   . Multiple sclerosis ([place])     in remission as of 11/2011 (diagnosed late 1980s)  . GERD (gastroesophageal reflux disease)   . Hypertension   . MRSA (methicillin resistant Staphylococcus aureus)   . 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[place])     2002  . History of syphilis     noted [facility] records  . Asthma     per 2003 [facility] pulm records pfts   . DDD (degenerative disc disease)     cervical spine  . PVD (peripheral vascular disease) ([place])     Left Stent 07/02/2008  . Depression   . Pneumonia   . UTI (urinary tract infection)   . Clotting disorder ([place])   . Blood dyscrasia     HIV  . Fever     unknwon origin  . Hep C w/o coma, chronic ([facility])    Current Outpatient Prescriptions  Medication Sig Dispense Refill  . atorvastatin (LIPITOR) 40 MG tablet TAKE 1 TABLET BY  MOUTH DAILY 90 tablet 2  . BD PEN NEEDLE NANO U/F 32G X 4 MM MISC USE AS DIRECTED FOUR TIMES DAILY 100 each 0  . Blood Glucose Monitoring Suppl (BAYER CONTOUR MONITOR) W/DEVICE KIT Use to check blood sugar as instructed up to 4 times a day 1 kit 0  . clopidogrel (PLAVIX) 75 MG tablet Take 1 tablet (75 mg total) by mouth daily. 90 tablet 0  . glucose blood (BAYER CONTOUR NEXT TEST) test strip Four times daily before meals and at bedtime, insulin dependent, ICD-10 CODE E11.42 120 each 11  . insulin glargine (LANTUS) 100 UNIT/ML injection Inject 0.73 mLs (73 Units total) into the skin at bedtime. (Patient taking differently: Inject 67 Units into the skin at bedtime. ) 10 mL 12  . insulin lispro (HUMALOG KWIKPEN) 100 UNIT/ML KiwkPen 26 three times a day with meals (Patient taking differently: 28 Units. 26 three times a day with meals) 15 mL 11  . Insulin Pen Needle (PEN NEEDLES 3/16") 31G X 5 MM MISC 1 application by Does not apply route 4 (four) times daily. Use to inject insulin twice daily Dx code 250.00 120 each 11  . ISENTRESS 400 MG  tablet TAKE 1 TABLET BY MOUTH TWICE DAILY 60 tablet 5  . lamiVUDine-zidovudine (COMBIVIR) 150-300 MG per tablet TAKE 1 TABLET BY MOUTH TWICE DAILY 60 tablet 5  . Lancets MISC Use to Test Blood Sugar Three Times Daily. Dx Code: 250.02 100 each 3  . LANTUS SOLOSTAR 100 UNIT/ML Solostar Pen   12  . lisinopril (PRINIVIL,ZESTRIL) 40 MG tablet Take 1 tablet (40 mg total) by mouth daily. 90 tablet 3  . metFORMIN (GLUCOPHAGE) 1000 MG tablet Take 1 tablet (1,000 mg total) by mouth 2 (two) times daily with a meal. 60 tablet 5  . omeprazole (PRILOSEC) 40 MG capsule TAKE 1 CAPSULE BY MOUTH DAILY 90 capsule 0  . Oxycodone HCl 10 MG TABS Take 1 tablet (10 mg total) by mouth 3 (three) times daily as needed. 90 tablet 0  . pregabalin (LYRICA) 50 MG capsule Take 1 capsule (50 mg total) by mouth 3 (three) times daily. 90 capsule 0  . VIREAD 300 MG tablet TAKE 1 TABLET BY MOUTH DAILY  30 tablet 5   No current facility-administered medications for this visit.   Family History  Problem Relation Age of Onset  . Diabetes Mother   . Coronary artery disease Mother   . Cancer Brother     colon caner stage 4 as of 11/2011 (unknown age of onset)  . Prostate cancer Brother   . Coronary artery disease Father    Social History   Social History  . Marital Status: Single    Spouse Name: N/A  . Number of Children: N/A  . Years of Education: N/A   Social History Main Topics  . Smoking status: Former Smoker    Quit date: 05/09/2007  . Smokeless tobacco: Never Used  . Alcohol Use: No  . Drug Use: No  . Sexual Activity: Not Asked   Other Topics Concern  . None   Social History Narrative   Review of Systems: Review of Systems  Constitutional: Negative for fever and chills.  HENT: Negative for ear pain.   Eyes: Negative for blurred vision and pain.  Respiratory: Negative for cough, shortness of breath and wheezing.   Cardiovascular: Negative for chest pain, palpitations and leg swelling.  Gastrointestinal: Negative for nausea, vomiting and abdominal pain.  Genitourinary: Negative for dysuria, urgency and frequency.  Musculoskeletal: Negative for myalgias.       RLE: AKA stump pain  Skin: Negative for itching and rash.  Neurological: Negative for dizziness, sensory change, focal weakness and headaches.   Objective:  Physical Exam: Filed Vitals:   03/02/15 1126  BP: 156/56  Pulse: 72  Temp: 97.6 F (36.4 C)  TempSrc: Oral  Height: $Remove'6\' 1"'ziKMxjk$  (1.854 m)  Weight: 162 lb 11.2 oz (73.8 kg)  SpO2: 97%   Physical Exam  Constitutional: He is oriented to person, place, and time. He appears well-developed and well-nourished. No distress.  HENT:  Head: Normocephalic and atraumatic.  Eyes: EOM are normal. Pupils are equal, round, and reactive to light.  Neck: Neck supple. No tracheal deviation present.  Cardiovascular: Normal rate, regular rhythm and intact distal  pulses.   Pulmonary/Chest: Effort normal. No respiratory distress. He has no wheezes. He has no rales.  Abdominal: Soft. Bowel sounds are normal. He exhibits no distension. There is no tenderness.  Musculoskeletal: He exhibits no edema.  RLE stump site: No pain on palpation. No erythema, increased warmth, or swelling. Capillary refill is good.   Neurological: He is alert and oriented to person, place, and time.  Skin: Skin is warm and dry.   Assessment & Plan:

## 2015-03-04 NOTE — Assessment & Plan Note (Addendum)
Patient presenting with RLE AKA stump site "shocking pain" that radiates up the lateral aspect of his thigh. Reports that the pain has been getting progressively worse. States his prosthetic leg fits him correctly and he never had any problems in the past. Denies any history of injury to the area. Patient does have a history of MS and states it is currently in remission. He is a former smoker, quit 7 years ago. Reports being on Plavix due to history of blood clots in the contralateral leg. Stump site was non tender to palpation and did not appear infected on examination. Pt is afebrile. X ray of the stump site did not show any acute abnormality. Patient could possibly be experiencing neuropathic pain, however, arterial insufficieny has to be ruled out.  -Arterial blood flow study ordered, f/u results  -Lyrica 50 mg TID for possible neuropathic pain   Addendum (03/05/15 at 12:13 pm): Vascular lab informed me that the right lower extremity duplex was just done. Results showing occluded right common femoral artery, occluded femoral artery with stent, and occluded bypass graft. Also showing a trace amount of blood flow in the right profunda femoral artery proximal but no other flow identified. I called vascular surgery and the secretary informed me that the attending physician on-call thinks the patient needs to go to the emergency room. The patient was still at the vascular lab and I spoke to him over the phone at 12:09 pm. I I discussed the study findings with him and suggested he go to the emergency room emergently. Patient expressed his understanding and agreed with the plan.

## 2015-03-05 ENCOUNTER — Encounter: Payer: Self-pay | Admitting: Internal Medicine

## 2015-03-05 ENCOUNTER — Encounter (HOSPITAL_COMMUNITY): Payer: Self-pay | Admitting: Emergency Medicine

## 2015-03-05 ENCOUNTER — Other Ambulatory Visit: Payer: Self-pay | Admitting: Internal Medicine

## 2015-03-05 ENCOUNTER — Emergency Department (HOSPITAL_COMMUNITY)
Admission: EM | Admit: 2015-03-05 | Discharge: 2015-03-05 | Disposition: A | Discharge status: Home / Self Care | Payer: Medicare Other | Attending: Emergency Medicine | Admitting: Emergency Medicine

## 2015-03-05 ENCOUNTER — Ambulatory Visit (HOSPITAL_COMMUNITY): Admission: RE | Admit: 2015-03-05 | Payer: Medicare Other | Source: Ambulatory Visit

## 2015-03-05 ENCOUNTER — Ambulatory Visit (HOSPITAL_BASED_OUTPATIENT_CLINIC_OR_DEPARTMENT_OTHER)
Admission: RE | Admit: 2015-03-05 | Discharge: 2015-03-05 | Disposition: A | Discharge status: Home / Self Care | Payer: Medicare Other | Source: Ambulatory Visit | Attending: Internal Medicine | Admitting: Internal Medicine

## 2015-03-05 DIAGNOSIS — Z87891 Personal history of nicotine dependence: Secondary | ICD-10-CM | POA: Insufficient documentation

## 2015-03-05 DIAGNOSIS — Z87440 Personal history of urinary (tract) infections: Secondary | ICD-10-CM | POA: Diagnosis not present

## 2015-03-05 DIAGNOSIS — B20 Human immunodeficiency virus [HIV] disease: Secondary | ICD-10-CM | POA: Diagnosis not present

## 2015-03-05 DIAGNOSIS — Z89611 Acquired absence of right leg above knee: Secondary | ICD-10-CM | POA: Insufficient documentation

## 2015-03-05 DIAGNOSIS — Z8659 Personal history of other mental and behavioral disorders: Secondary | ICD-10-CM | POA: Insufficient documentation

## 2015-03-05 DIAGNOSIS — I10 Essential (primary) hypertension: Secondary | ICD-10-CM | POA: Insufficient documentation

## 2015-03-05 DIAGNOSIS — M79661 Pain in right lower leg: Secondary | ICD-10-CM

## 2015-03-05 DIAGNOSIS — M79604 Pain in right leg: Secondary | ICD-10-CM | POA: Insufficient documentation

## 2015-03-05 DIAGNOSIS — Z8739 Personal history of other diseases of the musculoskeletal system and connective tissue: Secondary | ICD-10-CM | POA: Diagnosis not present

## 2015-03-05 DIAGNOSIS — Z8701 Personal history of pneumonia (recurrent): Secondary | ICD-10-CM | POA: Insufficient documentation

## 2015-03-05 DIAGNOSIS — E119 Type 2 diabetes mellitus without complications: Secondary | ICD-10-CM | POA: Insufficient documentation

## 2015-03-05 DIAGNOSIS — Z8614 Personal history of Methicillin resistant Staphylococcus aureus infection: Secondary | ICD-10-CM | POA: Insufficient documentation

## 2015-03-05 DIAGNOSIS — Z794 Long term (current) use of insulin: Secondary | ICD-10-CM | POA: Diagnosis not present

## 2015-03-05 DIAGNOSIS — J45909 Unspecified asthma, uncomplicated: Secondary | ICD-10-CM | POA: Diagnosis not present

## 2015-03-05 DIAGNOSIS — E872 Acidosis, unspecified: Secondary | ICD-10-CM

## 2015-03-05 DIAGNOSIS — Z8619 Personal history of other infectious and parasitic diseases: Secondary | ICD-10-CM | POA: Insufficient documentation

## 2015-03-05 DIAGNOSIS — T8789 Other complications of amputation stump: Secondary | ICD-10-CM | POA: Diagnosis not present

## 2015-03-05 DIAGNOSIS — K219 Gastro-esophageal reflux disease without esophagitis: Secondary | ICD-10-CM | POA: Diagnosis not present

## 2015-03-05 DIAGNOSIS — E785 Hyperlipidemia, unspecified: Secondary | ICD-10-CM | POA: Insufficient documentation

## 2015-03-05 DIAGNOSIS — Z79899 Other long term (current) drug therapy: Secondary | ICD-10-CM | POA: Insufficient documentation

## 2015-03-05 DIAGNOSIS — Z7902 Long term (current) use of antithrombotics/antiplatelets: Secondary | ICD-10-CM | POA: Insufficient documentation

## 2015-03-05 LAB — COMPREHENSIVE METABOLIC PANEL
ALK PHOS: 77 U/L (ref 38–126)
ALT: 30 U/L (ref 17–63)
ANION GAP: 12 (ref 5–15)
AST: 30 U/L (ref 15–41)
Albumin: 3.6 g/dL (ref 3.5–5.0)
BUN: 14 mg/dL (ref 6–20)
CALCIUM: 9.3 mg/dL (ref 8.9–10.3)
CHLORIDE: 98 mmol/L — AB (ref 101–111)
CO2: 21 mmol/L — AB (ref 22–32)
CREATININE: 0.92 mg/dL (ref 0.61–1.24)
Glucose, Bld: 294 mg/dL — ABNORMAL HIGH (ref 65–99)
Potassium: 4.3 mmol/L (ref 3.5–5.1)
SODIUM: 131 mmol/L — AB (ref 135–145)
Total Bilirubin: 0.3 mg/dL (ref 0.3–1.2)
Total Protein: 6.8 g/dL (ref 6.5–8.1)

## 2015-03-05 LAB — CBC WITH DIFFERENTIAL/PLATELET
BASOS PCT: 0 %
Basophils Absolute: 0 10*3/uL (ref 0.0–0.1)
EOS ABS: 0.2 10*3/uL (ref 0.0–0.7)
Eosinophils Relative: 2 %
HCT: 33.1 % — ABNORMAL LOW (ref 39.0–52.0)
Hemoglobin: 10.9 g/dL — ABNORMAL LOW (ref 13.0–17.0)
LYMPHS ABS: 3 10*3/uL (ref 0.7–4.0)
Lymphocytes Relative: 46 %
MCH: 33.5 pg (ref 26.0–34.0)
MCHC: 32.9 g/dL (ref 30.0–36.0)
MCV: 101.8 fL — ABNORMAL HIGH (ref 78.0–100.0)
MONO ABS: 0.6 10*3/uL (ref 0.1–1.0)
MONOS PCT: 8 %
Neutro Abs: 2.9 10*3/uL (ref 1.7–7.7)
Neutrophils Relative %: 44 %
Platelets: 245 10*3/uL (ref 150–400)
RBC: 3.25 MIL/uL — ABNORMAL LOW (ref 4.22–5.81)
RDW: 13.9 % (ref 11.5–15.5)
WBC: 6.6 10*3/uL (ref 4.0–10.5)

## 2015-03-05 LAB — PROTIME-INR
INR: 1.05 (ref 0.00–1.49)
PROTHROMBIN TIME: 13.9 s (ref 11.6–15.2)

## 2015-03-05 LAB — APTT: APTT: 27 s (ref 24–37)

## 2015-03-05 LAB — I-STAT CG4 LACTIC ACID, ED: LACTIC ACID, VENOUS: 3.56 mmol/L — AB (ref 0.5–2.0)

## 2015-03-05 MED ORDER — IBUPROFEN 400 MG PO TABS
600.0000 mg | ORAL_TABLET | Freq: Once | ORAL | Status: AC
  Administered 2015-03-05: 600 mg via ORAL
  Filled 2015-03-05 (×2): qty 1

## 2015-03-05 NOTE — Progress Notes (Addendum)
*  PRELIMINARY RESULTS* Vascular Ultrasound Right lower extremity arterial duplex has been completed.  Preliminary findings: Appears to be occluded right common femoral artery, occluded femoral artery with stent, and occluded bypass graft. Unsure if these were previously documented as occluded or if this is a new occurrence. There is a trace amount of flow in the right profunda femoral artery proximal, but no other flow identified.    Called results to [place]. She will contact Dr. Lynnae January.   Landry Mellow, RDMS, RVT  03/05/2015, 11:21 AM

## 2015-03-05 NOTE — ED Provider Notes (Signed)
CSN: [id number]     Arrival date & time 03/05/15  1259 History   First MD Initiated Contact with Patient 03/05/15 1304     Chief Complaint  Patient presents with  . DVT     (Consider location/radiation/quality/duration/timing/severity/associated sxs/prior Treatment) Patient is a 64 y.o. male presenting with leg pain.  Leg Pain Time since incident:  12 days Pain details:    Quality:  Throbbing and sharp   Radiates to: radiates up medial part of right leg AKA.   Severity:  Severe   Onset quality:  Gradual   Duration:  12 days   Timing:  Constant   Progression:  Unchanged Chronicity:  New Dislocation: no   Relieved by:  NSAIDs Ineffective treatments: just started lyrica 2 days ago. Associated symptoms: no back pain, no fatigue and no fever     Past Medical History  Diagnosis Date  . Diabetes mellitus   . HIV infection ([place])     undetectable viral load and CD4 ct 667 as of 11/2011  . Colon polyps     noted previous colonoscopy [facility]  . Hyperlipidemia   . Multiple sclerosis ([place])     in remission as of 11/2011 (diagnosed late 1980s)  . GERD (gastroesophageal reflux disease)   . Hypertension   . MRSA (methicillin resistant Staphylococcus aureus)   . 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[place])     2002  . History of syphilis     noted [facility] records  . Asthma     per 2003 [facility] pulm records pfts   . DDD (degenerative disc disease)     cervical spine  . PVD (peripheral vascular disease) ([place])     Left Stent 07/02/2008  . Depression   . Pneumonia   . UTI (urinary tract infection)   . Clotting disorder ([place])   . Blood dyscrasia     HIV  . Fever     unknwon origin  . Hep C w/o coma, chronic ([place])   . Pain in limb-Right Leg 10/13/2013   Past Surgical History  Procedure Laterality Date  . Other surgical history      right lower ext AKA with prothesis   . Other surgical history      left left with stents (Dr. Seward Speck)  . Other surgical history      2003  colonoscopy 5 mm polyp transverse colon; (2)30m  polyps in rectum-hyperplastic  . Above knee leg amputation      Right Leg  . Lower extremity angiogram Left 12/26/2011    Procedure: LOWER EXTREMITY ANGIOGRAM;  Surgeon: VSerafina Mitchell MD;  [facility];  Service: Cardiovascular;  Laterality: Left;   Family History  Problem Relation Age of Onset  . Diabetes Mother   . Coronary artery disease Mother   . Cancer Brother     colon caner stage 4 as of 11/2011 (unknown age of onset)  . Prostate cancer Brother   . Coronary artery disease Father    Social History  Substance Use Topics  . Smoking status: Former Smoker    Quit date: 05/09/2007  . Smokeless tobacco: Never Used  . Alcohol Use: No    Review of Systems  Constitutional: Negative for fever, chills, appetite change and fatigue.  HENT: Negative for sore throat.   Eyes: Negative for visual disturbance.  Respiratory: Negative for shortness of breath.   Cardiovascular: Negative for chest pain.  Gastrointestinal: Negative for nausea, vomiting, abdominal pain, diarrhea and blood in stool.  Genitourinary: Negative  for dysuria and difficulty urinating.  Musculoskeletal: Positive for myalgias and arthralgias. Negative for back pain and neck stiffness.  Skin: Negative for rash.  Neurological: Negative for syncope and headaches.      Allergies  Sulfonamide derivatives  Home Medications   Prior to Admission medications   Medication Sig Start Date End Date Taking? Authorizing Provider  atorvastatin (LIPITOR) 40 MG tablet TAKE 1 TABLET BY MOUTH DAILY 01/18/15  Yes Truman Hayward, MD  BD PEN NEEDLE NANO U/F 32G X 4 MM MISC USE AS DIRECTED FOUR TIMES DAILY 01/31/13  Yes Nino Glow McLean-Scocozza, MD  Blood Glucose Monitoring Suppl (BAYER CONTOUR MONITOR) W/DEVICE KIT Use to check blood sugar as instructed up to 4 times a day 07/11/11  Yes Bartholomew Crews, MD  clopidogrel (PLAVIX) 75 MG tablet Take 1 tablet (75 mg total) by  mouth daily. 01/20/15  Yes Jule Ser, DO  glucose blood (BAYER CONTOUR NEXT TEST) test strip Four times daily before meals and at bedtime, insulin dependent, ICD-10 CODE E11.42 08/12/14  Yes Nino Glow McLean-Scocozza, MD  ibuprofen (ADVIL,MOTRIN) 200 MG tablet Take 400-600 mg by mouth every 6 (six) hours as needed.   Yes Historical Provider, MD  insulin glargine (LANTUS) 100 UNIT/ML injection Inject 0.73 mLs (73 Units total) into the skin at bedtime. Patient taking differently: Inject 67 Units into the skin at bedtime.  03/19/14  Yes Nino Glow McLean-Scocozza, MD  insulin lispro (HUMALOG KWIKPEN) 100 UNIT/ML KiwkPen 26 three times a day with meals Patient taking differently: 28 Units. 26 three times a day with meals 03/19/14  Yes Nino Glow McLean-Scocozza, MD  Insulin Pen Needle (PEN NEEDLES 3/16") 31G X 5 MM MISC 1 application by Does not apply route 4 (four) times daily. Use to inject insulin twice daily Dx code 250.00 11/02/11  Yes Bartholomew Crews, MD  ISENTRESS 400 MG tablet TAKE 1 TABLET BY MOUTH TWICE DAILY 01/18/15  Yes Truman Hayward, MD  lamiVUDine-zidovudine (COMBIVIR) 150-300 MG per tablet TAKE 1 TABLET BY MOUTH TWICE DAILY 01/18/15  Yes Truman Hayward, MD  lisinopril (PRINIVIL,ZESTRIL) 40 MG tablet Take 1 tablet (40 mg total) by mouth daily. 12/11/14  Yes Axel Filler, MD  omeprazole (PRILOSEC) 40 MG capsule TAKE 1 CAPSULE BY MOUTH DAILY 01/19/15  Yes Jule Ser, DO  Oxycodone HCl 10 MG TABS Take 1 tablet (10 mg total) by mouth 3 (three) times daily as needed. 02/16/15  Yes Truman Hayward, MD  pregabalin (LYRICA) 50 MG capsule Take 1 capsule (50 mg total) by mouth 3 (three) times daily. 03/02/15 03/01/16 Yes Shela Leff, MD  VIREAD 300 MG tablet TAKE 1 TABLET BY MOUTH DAILY 01/18/15  Yes Truman Hayward, MD  Lancets MISC Use to Test Blood Sugar Three Times Daily. Dx Code: 250.02 03/01/12 03/19/14  Madilyn Fireman, MD   BP 144/72 mmHg  Pulse 66   Temp(Src) 97.7 F (36.5 C) (Oral)  Resp 16  SpO2 99% Physical Exam  Constitutional: He is oriented to person, place, and time. He appears well-developed and well-nourished. No distress.  HENT:  Head: Normocephalic and atraumatic.  Eyes: Conjunctivae and EOM are normal.  Neck: Normal range of motion.  Cardiovascular: Normal rate, regular rhythm, normal heart sounds and intact distal pulses.  Exam reveals no gallop and no friction rub.   No murmur heard. Pulmonary/Chest: Effort normal and breath sounds normal. No respiratory distress. He has no wheezes. He has no rales.  Abdominal: Soft.  He exhibits no distension. There is no tenderness. There is no guarding.  Musculoskeletal: He exhibits no edema.  Right leg AKA No erythema Warm, normal capillary refill No swelling  Neurological: He is alert and oriented to person, place, and time.  Skin: Skin is warm and dry. No rash noted. He is not diaphoretic.  Nursing note and vitals reviewed.   ED Course  Procedures (including critical care time) Labs Review Labs Reviewed  CBC WITH DIFFERENTIAL/PLATELET - Abnormal; Notable for the following:    RBC 3.25 (*)    Hemoglobin 10.9 (*)    HCT 33.1 (*)    MCV 101.8 (*)    All other components within normal limits  COMPREHENSIVE METABOLIC PANEL - Abnormal; Notable for the following:    Sodium 131 (*)    Chloride 98 (*)    CO2 21 (*)    Glucose, Bld 294 (*)    All other components within normal limits  I-STAT CG4 LACTIC ACID, ED - Abnormal; Notable for the following:    Lactic Acid, Venous 3.56 (*)    All other components within normal limits  APTT  PROTIME-INR    Imaging Review No results found. I have personally reviewed and evaluated these images and lab results as part of my medical decision-making.   EKG Interpretation None      MDM   Final diagnoses:  Lactic acidosis  Right leg pain    64yo male with history of HIV, DM, MS, hyperlipidemia, htn, hx of PVD with hx of  right AKA 2009, LLE stent, presents with concern of right leg pain after being seen by PCP and having [place] concerning for right femoral artery occlusion.  Consulted vascular surgery regarding possible occlusion as cause of pain, and they reviewed [place] from prior and found this present on prior studies, likely representing chronic clot, and given patient's extremity appears warm, well perfused, feel he is unlikely to have acute arterial occlusion and recommend outpatient follow up and consideration of other testing if pain continues.  Lactic acid was ordered in evaluation of possible limb ischemia and returned at 3.5, however vascular surgery reports patient's lactic acidosis is unlikely secondary to limb ischemia given prior US findings and exam.  Regarding patient's lactic acidosis, he is without symptoms other than right leg pain and denies any infectious symptoms, has normal blood pressures, normal perfusion, and doubt sepsis as etiology of this finding. No hx of dehydration and patient well appearing, well hydrated. Bicarb mildly decreased at 21. No hx of liver disease, etoh use.  [facility] regarding admission who evaluated patient.  Providers in agreement that lactic acidosis is unlikely secondary to poor perfusion, and possible etiologies include metformin, HIV medications, malignancy.  Per IM recommendations, will discontinue metformin and patient will follow up Monday for laboratory recheck with clinic appt Wednesday.  Patient discharged in stable condition with understanding of reasons to return.    Gareth Morgan, MD 03/05/15 [phone]

## 2015-03-05 NOTE — Consult Note (Signed)
[facility]  Patient name: Nathan [name] MRN: [id number] DOB: [birth year] Sex: male  REASON FOR CONSULT: Pain in the right AKA stump. Consult is from the emergency department.  HPI: Nathan [name] is a 64 y.o. male, who was last seen in our office by the nurse practitioner on 10/13/2013. He has had previous left lower extremity stenting by Dr. Trula Slade. When he was last seen he reported moderate pain at the bony prominence of his right AKA stump. This occurred with walking only. He was to return in one year with follow up ABI's on the left.  The patient has continued to have some pain in his right AKA when he is wearing his prosthesis. Approximately 2 weeks ago he noted gradually increasing pain in his right AKA. He was seen in the medical clinic yesterday complaining of pain in his right AKA. They set him up for a duplex scan which was done today. No Doppler flow was noted in the common femoral artery and the superficial femoral artery. This reason he was sent to the emergency department  I do not get any history of acute onset pain in his right AKA. He does describes claudication in his left calf but no rest pain and no nonhealing ulcers.  His risk factors for vascular disease include diabetes, hypertension, hypercholesterolemia, a family history of premature cardiovascular disease, and a remote history of tobacco use. He quit 7 years ago when he had his amputation on the right.  At the time of his last visit he had bilateral 40-59% carotid stenoses in her right subclavian artery stenosis.  Past Medical History  Diagnosis Date  . Diabetes mellitus   . HIV infection ([place])     undetectable viral load and CD4 ct 667 as of 11/2011  . Colon polyps     noted previous colonoscopy [facility]  . Hyperlipidemia   . Multiple sclerosis ([place])     in remission as of 11/2011 (diagnosed late 1980s)  . GERD (gastroesophageal reflux disease)   . Hypertension   . MRSA (methicillin  resistant Staphylococcus aureus)   . 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[place])     2002  . History of syphilis     noted [facility] records  . Asthma     per 2003 [facility] pulm records pfts   . DDD (degenerative disc disease)     cervical spine  . PVD (peripheral vascular disease) ([place])     Left Stent 07/02/2008  . Depression   . Pneumonia   . UTI (urinary tract infection)   . Clotting disorder ([place])   . Blood dyscrasia     HIV  . Fever     unknwon origin  . Hep C w/o coma, chronic ([place])   . Pain in limb-Right Leg 10/13/2013    Family History  Problem Relation Age of Onset  . Diabetes Mother   . Coronary artery disease Mother   . Cancer Brother     colon caner stage 4 as of 11/2011 (unknown age of onset)  . Prostate cancer Brother   . Coronary artery disease Father    He does have a family history of premature cardiovascular disease. Both his mother and father had heart disease in a young age.  SOCIAL HISTORY: Social History   Social History  . Marital Status: Single    Spouse Name: N/A  . Number of Children: N/A  . Years of Education: N/A   Occupational History  . Not on  file.   Social History Main Topics  . Smoking status: Former Smoker    Quit date: 05/09/2007  . Smokeless tobacco: Never Used  . Alcohol Use: No  . Drug Use: No  . Sexual Activity: Not on file   Other Topics Concern  . Not on file   Social History Narrative    Allergies  Allergen Reactions  . Sulfonamide Derivatives Hives    No current facility-administered medications for this encounter.   Current Outpatient Prescriptions  Medication Sig Dispense Refill  . atorvastatin (LIPITOR) 40 MG tablet TAKE 1 TABLET BY MOUTH DAILY 90 tablet 2  . BD PEN NEEDLE NANO U/F 32G X 4 MM MISC USE AS DIRECTED FOUR TIMES DAILY 100 each 0  . Blood Glucose Monitoring Suppl (BAYER CONTOUR MONITOR) W/DEVICE KIT Use to check blood sugar as instructed up to 4 times a day 1 kit 0  . clopidogrel (PLAVIX) 75 MG  tablet Take 1 tablet (75 mg total) by mouth daily. 90 tablet 0  . glucose blood (BAYER CONTOUR NEXT TEST) test strip Four times daily before meals and at bedtime, insulin dependent, ICD-10 CODE E11.42 120 each 11  . insulin glargine (LANTUS) 100 UNIT/ML injection Inject 0.73 mLs (73 Units total) into the skin at bedtime. (Patient taking differently: Inject 67 Units into the skin at bedtime. ) 10 mL 12  . insulin lispro (HUMALOG KWIKPEN) 100 UNIT/ML KiwkPen 26 three times a day with meals (Patient taking differently: 28 Units. 26 three times a day with meals) 15 mL 11  . Insulin Pen Needle (PEN NEEDLES 3/16") 31G X 5 MM MISC 1 application by Does not apply route 4 (four) times daily. Use to inject insulin twice daily Dx code 250.00 120 each 11  . ISENTRESS 400 MG tablet TAKE 1 TABLET BY MOUTH TWICE DAILY 60 tablet 5  . lamiVUDine-zidovudine (COMBIVIR) 150-300 MG per tablet TAKE 1 TABLET BY MOUTH TWICE DAILY 60 tablet 5  . Lancets MISC Use to Test Blood Sugar Three Times Daily. Dx Code: 250.02 100 each 3  . LANTUS SOLOSTAR 100 UNIT/ML Solostar Pen   12  . lisinopril (PRINIVIL,ZESTRIL) 40 MG tablet Take 1 tablet (40 mg total) by mouth daily. 90 tablet 3  . metFORMIN (GLUCOPHAGE) 1000 MG tablet Take 1 tablet (1,000 mg total) by mouth 2 (two) times daily with a meal. 60 tablet 5  . omeprazole (PRILOSEC) 40 MG capsule TAKE 1 CAPSULE BY MOUTH DAILY 90 capsule 0  . Oxycodone HCl 10 MG TABS Take 1 tablet (10 mg total) by mouth 3 (three) times daily as needed. 90 tablet 0  . pregabalin (LYRICA) 50 MG capsule Take 1 capsule (50 mg total) by mouth 3 (three) times daily. 90 capsule 0  . VIREAD 300 MG tablet TAKE 1 TABLET BY MOUTH DAILY 30 tablet 5    REVIEW OF SYSTEMS:  '[X]'  denotes positive finding, '[ ]'  denotes negative finding Cardiac  Comments:  Chest pain or chest pressure:    Shortness of breath upon exertion:    Short of breath when lying flat:    Irregular heart rhythm:        Vascular    Pain  in calf, thigh, or hip brought on by ambulation: X Left calf  Pain in feet at night that wakes you up from your sleep:     Blood clot in your veins:    Leg swelling:         Pulmonary    Oxygen at home:  Productive cough:     Wheezing:         Neurologic    Sudden weakness in arms or legs:     Sudden numbness in arms or legs:     Sudden onset of difficulty speaking or slurred speech:    Temporary loss of vision in one eye:     Problems with dizziness:         Gastrointestinal    Blood in stool:     Vomited blood:         Genitourinary    Burning when urinating:     Blood in urine:        Psychiatric    Major depression:         Hematologic    Bleeding problems:    Problems with blood clotting too easily:        Skin    Rashes or ulcers:        Constitutional    Fever or chills:      PHYSICAL EXAM: Filed Vitals:   03/05/15 1309  BP: 141/73  Pulse: 83  Temp: 97.7 F (36.5 C)  TempSrc: Oral  Resp: 16  SpO2: 100%    GENERAL: The patient is a well-nourished male, in no acute distress. The vital signs are documented above. CARDIAC: There is a regular rate and rhythm.  VASCULAR: I do not detect carotid bruits. On the right side, I cannot palpate a femoral pulse. On the left side, he has a palpable femoral pulse and palpable popliteal pulse. He has a palpable left posterior tibial pulse. The left foot is warm and well-perfused. He has no left lower extremity swelling. PULMONARY: There is good air exchange bilaterally without wheezing or rales. ABDOMEN: Soft and non-tender with normal pitched bowel sounds.  MUSCULOSKELETAL: He has a right above-the-knee amputation. There are no wounds on his amputation site. The right AKA has good temperature. NEUROLOGIC: No focal weakness or paresthesias are detected. SKIN: There are no ulcers or rashes noted. PSYCHIATRIC: The patient has a normal affect.  DATA:  I have independently interpreted his duplex scan which shows  no Doppler flow in the right common femoral artery or superficial femoral artery. There is some flow in the deep femoral artery.  I have reviewed his previous arteriogram from 2013. 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  MEDICAL ISSUES:  PAIN IN RIGHT AKA: I think the issue with the right AKA is chronic. He had an occluded external iliac artery back in 2013. Thus the findings on duplex today I believe are chronic. He has no reason to have had an acute arterial occlusion. Likewise his symptoms have been gradual in onset. I have instructed him not to wear his right above-the-knee prosthesis for now. I will arrange for a follow up visit with Dr. Trula Slade in 2-3 weeks to see if the symptoms are improving. If not, given that he has a prosthesis it would be best to avoid shortening his AKA and therefore he could potentially be considered for an arteriogram if his symptoms do not improve. However, based on his current exam the AKA appears adequately perfused and has good temperature. Will arrange for his follow up visit.   Deitra Mayo [facility] [place]: [phone]

## 2015-03-05 NOTE — ED Notes (Signed)
Pt sent here for DVT to right stump; pt sts pain x 2 weeks

## 2015-03-05 NOTE — Assessment & Plan Note (Signed)
A1c 9.2. Discuss at next visit.

## 2015-03-05 NOTE — Consult Note (Signed)
Date: 03/05/2015               Patient Name:  Nathan [name] MRN: [id number]  DOB: [birth year] Age / Sex: 64 y.o., male   PCP: Jule Ser, DO         Requesting Physician: Dr. Rayne Du att. providers found    Consulting Reason:  Lactic Acidosis     Chief Complaint: Right Lower Extremity s/p AKA pain  History of Present Illness: Nathan [name] is a 64 yo male with PMHx of HIV, T2DM, HLD, Multiple Sclerosis, PVD, GERD, HTN, Hepatitis B who presented to the ED after his RLE ultrasound revealed occlusion.   Patient originally presented to [facility] on 03/02/15 with complaint of right lower extremity pain. Patient is s/p AKA on the right. Per [facility] note "Patient states his amputation surgery was 7 years ago and he never had any problems in the past. States for the past 10 days he has been experiencing a "shocking pain" in the stump that radiates up the lateral aspect of his thigh. Reports that the pain has been getting progressively worse. States his prosthetic leg fits him correctly and he never had any problems in the past. Denies any history of injury to the area. Patient does have a history of MS and states it is currently in remission. He is a former smoker, quit 7 years ago. Reports being on Plavix due to history of blood clots in the contralateral leg."  Arterial blood flow study was ordered and patient was started on Lyrica 50 mg TID for neuropathic pain. Test was completed on 03/05/15 which showed an occluded right common femoral artery, occluded femoral artery with stent, and occluded bypass graft. Unsure if these were previously documented as occluded or if this is a new occurrence. There is a trace amount of flow in the right profunda femoral artery proximal, but no other flow identified.  Patient was then instructed to go to the ED. In the ED, patient was evaluated by vascular surgery who felt this issue was chronic and there was not evidence of an acute arterial occlusion as his arteriogram from 2013  also showed a right external iliac artery occlusion. Vascular surgery recommended discharge to home with follow up with them as outpatient, avoid wearing prosthesis for now, and repeating arteriogram if symptoms do not improve. Physical exam was reassuring.   [facility] was consulted for lactic acidosis. Lactic acid in the ED was 3.56. Patient denies any symptoms of headache, vision changes, nausea, vomiting, abdominal pain, diarrhea or constipation, dysuria. He does have urinary frequency, but this is chronic and he attributes it to his diabetes. Patient fell 5 weeks ago while going up stairs and was evaluated in the ED, but he had no evidence of fractures. He has been eating and drinking well, no increased weakness or fatigue. His only complaint is this right lower extremity pain which started 2 weeks ago and has gradually become worse. Pain is sharp and shooting from his right lateral thigh to the end of his right stump. Wearing his prosthetic makes the pain worse, using ibuprofen makes the pain better. He does not feel Lyrica helps. At its worst, pain is a 9/10 and improves to a 6/10 with ibuprofen.    Meds: No current facility-administered medications for this encounter.   Current Outpatient Prescriptions  Medication Sig Dispense Refill  . atorvastatin (LIPITOR) 40 MG tablet TAKE 1 TABLET BY MOUTH DAILY 90 tablet 2  . BD PEN NEEDLE NANO U/F 32G X 4  MM MISC USE AS DIRECTED FOUR TIMES DAILY 100 each 0  . Blood Glucose Monitoring Suppl (BAYER CONTOUR MONITOR) W/DEVICE KIT Use to check blood sugar as instructed up to 4 times a day 1 kit 0  . clopidogrel (PLAVIX) 75 MG tablet Take 1 tablet (75 mg total) by mouth daily. 90 tablet 0  . glucose blood (BAYER CONTOUR NEXT TEST) test strip Four times daily before meals and at bedtime, insulin dependent, ICD-10 CODE E11.42 120 each 11  . ibuprofen (ADVIL,MOTRIN) 200 MG tablet Take 400-600 mg by mouth every 6 (six) hours as needed.    . insulin glargine (LANTUS)  100 UNIT/ML injection Inject 0.73 mLs (73 Units total) into the skin at bedtime. (Patient taking differently: Inject 67 Units into the skin at bedtime. ) 10 mL 12  . insulin lispro (HUMALOG KWIKPEN) 100 UNIT/ML KiwkPen 26 three times a day with meals (Patient taking differently: 28 Units. 26 three times a day with meals) 15 mL 11  . Insulin Pen Needle (PEN NEEDLES 3/16") 31G X 5 MM MISC 1 application by Does not apply route 4 (four) times daily. Use to inject insulin twice daily Dx code 250.00 120 each 11  . ISENTRESS 400 MG tablet TAKE 1 TABLET BY MOUTH TWICE DAILY 60 tablet 5  . lamiVUDine-zidovudine (COMBIVIR) 150-300 MG per tablet TAKE 1 TABLET BY MOUTH TWICE DAILY 60 tablet 5  . lisinopril (PRINIVIL,ZESTRIL) 40 MG tablet Take 1 tablet (40 mg total) by mouth daily. 90 tablet 3  . omeprazole (PRILOSEC) 40 MG capsule TAKE 1 CAPSULE BY MOUTH DAILY 90 capsule 0  . Oxycodone HCl 10 MG TABS Take 1 tablet (10 mg total) by mouth 3 (three) times daily as needed. 90 tablet 0  . pregabalin (LYRICA) 50 MG capsule Take 1 capsule (50 mg total) by mouth 3 (three) times daily. 90 capsule 0  . VIREAD 300 MG tablet TAKE 1 TABLET BY MOUTH DAILY 30 tablet 5  . Lancets MISC Use to Test Blood Sugar Three Times Daily. Dx Code: 250.02 100 each 3    Allergies: Allergies as of 03/05/2015 - Review Complete 03/05/2015  Allergen Reaction Noted  . Sulfonamide derivatives Hives 02/12/2006   Past Medical History  Diagnosis Date  . Diabetes mellitus   . HIV infection ([place])     undetectable viral load and CD4 ct 667 as of 11/2011  . Colon polyps     noted previous colonoscopy [facility]  . Hyperlipidemia   . Multiple sclerosis ([place])     in remission as of 11/2011 (diagnosed late 1980s)  . GERD (gastroesophageal reflux disease)   . Hypertension   . MRSA (methicillin resistant Staphylococcus aureus)   . 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 History of syphilis     noted [facility] records  . Asthma      per 2003 [facility] pulm records pfts   . DDD (degenerative disc disease)     cervical spine  . PVD (peripheral vascular disease) ([place])     Left Stent 07/02/2008  . Depression   . Pneumonia   . UTI (urinary tract infection)   . Clotting disorder ([place])   . Blood dyscrasia     HIV  . Fever     unkn[place] origin  . Hep C w/o coma, chronic ([place])   . Pain in limb-Right Leg 10/13/2013   Past Surgical History  Procedure Laterality Date  . Other surgical history      right lower  ext AKA with prothesis   . Other surgical history      left left with stents (Dr. Seward Speck)  . Other surgical history      2003 colonoscopy 5 mm polyp transverse colon; (2)98m  polyps in rectum-hyperplastic  . Above knee leg amputation      Right Leg  . Lower extremity angiogram Left 12/26/2011    Procedure: LOWER EXTREMITY ANGIOGRAM;  Surgeon: VSerafina Mitchell MD;  [facility];  Service: Cardiovascular;  Laterality: Left;   Family History  Problem Relation Age of Onset  . Diabetes Mother   . Coronary artery disease Mother   . Cancer Brother     colon caner stage 4 as of 11/2011 (unknown age of onset)  . Prostate cancer Brother   . Coronary artery disease Father    Social History   Social History  . Marital Status: Single    Spouse Name: N/A  . Number of Children: N/A  . Years of Education: N/A   Occupational History  . Not on file.   Social History Main Topics  . Smoking status: Former Smoker    Quit date: 05/09/2007  . Smokeless tobacco: Never Used  . Alcohol Use: No  . Drug Use: No  . Sexual Activity: Not on file   Other Topics Concern  . Not on file   Social History Narrative    Review of Systems: General: Admits to occasional night sweats. Denies fever, chills, fatigue, change in appetite and diaphoresis.  HEENT: Denies vision changes Respiratory: Denies SOB, cough, and wheezing.   Cardiovascular: Denies chest pain and palpitations.  Gastrointestinal: Denies nausea,  vomiting, abdominal pain, diarrhea, constipation, blood in stool Genitourinary: Admits to increased urinary frequency (chronic). Denies dysuria, urgency, suprapubic pain and flank pain. Endocrine: Admits to polyuria. Denies polydipsia. Musculoskeletal: Admits to right lower extremity pain. Denies back pain, joint swelling Skin: Denies pallor, rash and wounds.  Neurological: Denies dizziness, headaches, weakness, lightheadedness, numbness  Physical Exam: Filed Vitals:   03/05/15 1530 03/05/15 1630 03/05/15 1645 03/05/15 1705  BP: 133/64 126/68 144/72   Pulse: 70 68 66   Temp:    97.7 F (36.5 C)  TempSrc:    Oral  Resp:      SpO2: 96% 95% 99%    General: Vital signs reviewed.  Patient is well-developed and well-nourished, in no acute distress and cooperative with exam.  Head: Normocephalic and atraumatic. Eyes: No scleral icterus.  Neck: Supple, trachea midline.  Cardiovascular: RRR, S1 normal, S2 normal, no murmurs, gallops, or rubs. Pulmonary/Chest: Clear to auscultation bilaterally, no wheezes, rales, or rhonchi. Abdominal: Soft, protuberant, non-tender, BS +, no masses, or guarding present.  Extremities: Mild tenderness on palpation of right proximal lower extremity. RLE is warm, well perfused, no obvious lesions, cyanosis or ecchymosis. No lower extremity edema bilaterally, posterior tibial pulse on left lower extremity palpable, intact left femoral pulse, absent right femoral pulse.  Neurological: A&O x3, no focal motor deficit, sensory intact to light touch bilaterally.  Skin: Warm, dry and intact. No rashes or erythema. Psychiatric: Normal mood and affect. speech and behavior is normal. Cognition and memory are normal.   Lab results: Basic Metabolic Panel:  Recent Labs  03/05/15 1344  NA 131*  K 4.3  CL 98*  CO2 21*  GLUCOSE 294*  BUN 14  CREATININE 0.92  CALCIUM 9.3   Liver Function Tests:  Recent Labs  03/05/15 1344  AST 30  ALT 30  ALKPHOS 77  BILITOT 0.3  PROT 6.8  ALBUMIN 3.6   CBC:  Recent Labs  03/05/15 1344  WBC 6.6  NEUTROABS 2.9  HGB 10.9*  HCT 33.1*  MCV 101.8*  PLT 245   Coagulation:  Recent Labs  03/05/15 1344  LABPROT 13.9  INR 1.05   Assessment, Plan, & Recommendations by Problem: Active Problems:   * No active hospital problems. *  Lactic Acidosis: Lactic acid 3.56. Vital signs are normal and stable, other labs are within normal limits- no leukocytosis, hemoglobin at baseline of 10.9, mild hyponatremia of 131, mild decreased bicarb of 21, mildly decreased chloride of 98. Differential includes medication induced, infection, acute ischemia, DKA, alcohol induced, and malignancy. Doubt infection as patient is afebrile, without leukocytosis, and has no complaints other than leg pain. Doubt acute ischemia as patient was cleared by vascular surgery, occlusion appears to be old on comparison with prior imaging, and extremity is warm and well perfused. Doubt DKA as glucose is mildly elevated at 294 and patient is chronically uncontrolled. Anion gap 12. Doubt alcohol induced as patient denies any alcohol use. Malignancy is a possibility, but no acute complaints. Patient does have a history of night sweats. This will need to worked up further as outpatient. Most likely cause of lactic acidosis is medication induced. Patient is on metformin, and several HIV medications including Viread, Combivir, Isentress. Both Viread and Combivir are know to cause to lactic acidosis as well as metformin. Given life threatening causes of lactic acidosis have been rule out, patient is safe to be discharged to home with follow up in our clinic.  -Discharge to home -DISCONTINUE metformin -Continue all other medications as prescribed -Follow up for lab appointment for lactic acid on Monday, October 31st -Follow up for clinic appointment on Wednesday, November 2nd.  -May need follow up with ID if lactic acid does not resolve -Will need a different  diabetic medication other than metformin  Right Lower Extremity Pain s/p AKA: Likely secondary to diabetic neuropathy given symptoms and chronicity of arterial occlusion. Vascular has cleared for discharge with follow up as outpatient. Pain improved with ibuprofen.  -Follow up with Vascular Surgery as outpatient -Continue ibuprofen -Continue Lyrica 50 mg TID, can consider increasing to 100 mg TID on follow up if pain persists  Dispo: Discharge home today.   The patient does have a current PCP Jule Ser, DO) and does need an [facility] hospital follow-up appointment after discharge.  The patient does not have transportation limitations that hinder transportation to clinic appointments.  Signed: Osa Craver, DO PGY-2 Internal Medicine Resident Pager # [phone] 03/05/2015 5:42 PM

## 2015-03-05 NOTE — Discharge Instructions (Signed)
FOLLOW UP ON Monday IN THE INTERNAL MEDICINE CLINIC FOR A LAB DRAW.   RETURN Wednesday November 2ND FOR AN APPOINTMENT AT 2:45 PM.   TAKE IBUPROFEN AND LYRICA FOR YOUR PAIN.   STOP METFORMIN. CONTINUE ALL OTHER MEDICATIONS AS PRESCRIBED. DRINK PLENTY OF WATER.

## 2015-03-08 ENCOUNTER — Other Ambulatory Visit (INDEPENDENT_AMBULATORY_CARE_PROVIDER_SITE_OTHER): Payer: Medicare Other

## 2015-03-08 DIAGNOSIS — E872 Acidosis, unspecified: Secondary | ICD-10-CM

## 2015-03-08 NOTE — Progress Notes (Signed)
Internal Medicine Clinic Attending  I saw and evaluated the patient.  I personally confirmed the key portions of the history and exam documented by Dr. Rathore and I reviewed pertinent patient test results.  The assessment, diagnosis, and plan were formulated together and I agree with the documentation in the resident's note.  

## 2015-03-09 ENCOUNTER — Telehealth: Payer: Self-pay | Admitting: Vascular Surgery

## 2015-03-09 LAB — LACTIC ACID, PLASMA: LACTATE: 20.9 mg/dL — AB (ref 4.5–19.8)

## 2015-03-09 NOTE — Telephone Encounter (Signed)
-----   Message from Mena Goes, RN sent at 03/05/2015  2:46 PM EDT ----- Regarding: schedule   ----- Message -----    From: Angelia Mould, MD    Sent: 03/05/2015   2:14 PM      To: [facility] Charge Pool Subject: charge and f/u                                 Level IV consult in the emergency department. He is a patient of Dr. Stephens Shire. He needs a follow up visit in proximally 3 weeks because of pain in his right above-the-knee amputation site. This is chronic. Thank you CD

## 2015-03-09 NOTE — Telephone Encounter (Signed)
LM for pt re appt, dpm °

## 2015-03-10 ENCOUNTER — Ambulatory Visit (INDEPENDENT_AMBULATORY_CARE_PROVIDER_SITE_OTHER): Payer: Medicare Other | Admitting: Internal Medicine

## 2015-03-10 ENCOUNTER — Encounter: Payer: Self-pay | Admitting: Internal Medicine

## 2015-03-10 VITALS — BP 129/59 | HR 72 | Temp 97.9°F | Ht 73.0 in | Wt 161.9 lb

## 2015-03-10 DIAGNOSIS — Z23 Encounter for immunization: Secondary | ICD-10-CM | POA: Diagnosis not present

## 2015-03-10 DIAGNOSIS — M79661 Pain in right lower leg: Secondary | ICD-10-CM | POA: Diagnosis not present

## 2015-03-10 DIAGNOSIS — E872 Acidosis, unspecified: Secondary | ICD-10-CM | POA: Insufficient documentation

## 2015-03-10 DIAGNOSIS — M79604 Pain in right leg: Secondary | ICD-10-CM | POA: Insufficient documentation

## 2015-03-10 LAB — BASIC METABOLIC PANEL
ANION GAP: 11 (ref 5–15)
BUN: 8 mg/dL (ref 6–20)
CO2: 23 mmol/L (ref 22–32)
Calcium: 9.3 mg/dL (ref 8.9–10.3)
Chloride: 102 mmol/L (ref 101–111)
Creatinine, Ser: 1.01 mg/dL (ref 0.61–1.24)
GFR calc Af Amer: >60 mL/min (ref >60)
GLUCOSE: 420 mg/dL — AB (ref 65–99)
POTASSIUM: 4.4 mmol/L (ref 3.5–5.1)
SODIUM: 136 mmol/L (ref 135–145)

## 2015-03-10 LAB — CBC WITH DIFFERENTIAL/PLATELET
BASOS ABS: 0 10*3/uL (ref 0.0–0.1)
Basophils Relative: 0 %
EOS PCT: 3 %
Eosinophils Absolute: 0.2 10*3/uL (ref 0.0–0.7)
HCT: 33.2 % — ABNORMAL LOW (ref 39.0–52.0)
Hemoglobin: 10.6 g/dL — ABNORMAL LOW (ref 13.0–17.0)
LYMPHS PCT: 55 %
Lymphs Abs: 3 10*3/uL (ref 0.7–4.0)
MCH: 32.4 pg (ref 26.0–34.0)
MCHC: 31.9 g/dL (ref 30.0–36.0)
MCV: 101.5 fL — AB (ref 78.0–100.0)
Monocytes Absolute: 0.4 10*3/uL (ref 0.1–1.0)
Monocytes Relative: 6 %
NEUTROS PCT: 36 %
Neutro Abs: 2 10*3/uL (ref 1.7–7.7)
PLATELETS: 281 10*3/uL (ref 150–400)
RBC: 3.27 MIL/uL — AB (ref 4.22–5.81)
RDW: 13.9 % (ref 11.5–15.5)
WBC: 5.6 10*3/uL (ref 4.0–10.5)

## 2015-03-10 LAB — LACTIC ACID, PLASMA: LACTIC ACID, VENOUS: 2.5 mmol/L — AB (ref 0.5–2.0)

## 2015-03-10 MED ORDER — OXYCODONE HCL 10 MG PO TABS: 10.0000 mg | ORAL_TABLET | Freq: Three times a day (TID) | ORAL | Status: DC | PRN

## 2015-03-10 MED ORDER — PREGABALIN 100 MG PO CAPS: 100.0000 mg | ORAL_CAPSULE | Freq: Three times a day (TID) | ORAL | Status: DC

## 2015-03-10 NOTE — Assessment & Plan Note (Signed)
Lactic acid in the ED was 3.56 on 03/05/15. Patient denied any symptoms of headache, vision changes, nausea, vomiting, abdominal pain, diarrhea or constipation, dysuria. He did have urinary frequency, but this is chronic and he attributed it to his diabetes. Patient fell 5 weeks ago while going up stairs and was evaluated in the ED, but he had no evidence of fractures. He has been eating and drinking well, no increased weakness or fatigue. His only complaint is this right lower extremity pain  For his lactic acidosis of 3.56, the following results from the ED: vital signs are normal and stable, other labs are within normal limits- no leukocytosis, hemoglobin at baseline of 10.9, mild hyponatremia of 131, mild decreased bicarb of 21, mildly decreased chloride of 98 make Type B lactic acidosis more likely. Differential included medication induced, infection, acute ischemia, DKA, alcohol induced, and malignancy. We doubted infection as patient was afebrile, without leukocytosis, and had no complaints other than leg pain. We doubted acute ischemia as patient was cleared by vascular surgery, occlusion appears to be old on comparison with prior imaging, and extremity is warm and well perfused. We doubted DKA as glucose was only mildly elevated at 294 and patient is chronically uncontrolled. Anion gap was 12. Doubted alcohol induced as patient denied any alcohol use. We believe the most likely cause of lactic acidosis is medication induced. He was on metformin, and several HIV medications including Viread, Combivir, Isentress. Both Viread and Combivir are know to cause to lactic acidosis as well as metformin. Given life threatening causes of lactic acidosis had been rule out, patient was discharged to home with follow up in our clinic. We discontinued his metformin but continued all other medications as prescribed. Repeat lactic acid on Monday was 20.9 mg/dL which converts to 1.15 mmol/L. Repeat lactic acid today was 2.5  mmol/L which is improved from 3.5 after discontinuing metformin. This appears to be consistent with medication induced lactic acidosis.   Plan:  -Metformin discontinued -Will discuss NRTIs with ID  -Repeat lactic acid on follow up with ID or with [facility]

## 2015-03-10 NOTE — Patient Instructions (Signed)
TAKE LYRICA 100 MG THREE TIMES A DAY  TAKE OXYCODONE 10 MG THREE TIMES A DAY AS NEEDED

## 2015-03-10 NOTE — Progress Notes (Signed)
Subjective:    Patient ID: Nathan [name], male    DOB: 1950-12-03, 64 y.o.   MRN: [id number]  HPI Nathan [name] is a 64 y.o. male with PMHx of HIV, T2DM, s/p right AKA who presents to the clinic for follow up for lactic acidosis. Please see A&P for the status of the patient's chronic medical problems.   Per my consult note on 03/05/15: Patient originally presented to [facility] on 03/02/15 with complaint of right lower extremity pain. Patient is s/p AKA on the right. Per [facility] note "Patient states his amputation surgery was 7 years ago and he never had any problems in the past. States for the past 10 days he has been experiencing a "shocking pain" in the stump that radiates up the lateral aspect of his thigh. Reports that the pain has been getting progressively worse. States his prosthetic leg fits him correctly and he never had any problems in the past. Denies any history of injury to the area. Patient does have a history of MS and states it is currently in remission. He is a former smoker, quit 7 years ago. Reports being on Plavix due to history of blood clots in the contralateral leg."  Arterial blood flow study was ordered and patient was started on Lyrica 50 mg TID for neuropathic pain. Test was completed on 03/05/15 which showed an occluded right common femoral artery, occluded femoral artery with stent, and occluded bypass graft. Unsure if these were previously documented as occluded or if this is a new occurrence. There is a trace amount of flow in the right profunda femoral artery proximal, but no other flow identified.  Patient was then instructed to go to the ED. In the ED, patient was evaluated by vascular surgery who felt this issue was chronic and there was not evidence of an acute arterial occlusion as his arteriogram from 2013 also showed a right external iliac artery occlusion. Vascular surgery recommended discharge to home with follow up with them as outpatient, avoid wearing prosthesis for  now, and repeating arteriogram if symptoms do not improve. Physical exam was reassuring.   [facility] was consulted for lactic acidosis. Lactic acid in the ED was 3.56. Patient denies any symptoms of headache, vision changes, nausea, vomiting, abdominal pain, diarrhea or constipation, dysuria. He does have urinary frequency, but this is chronic and he attributes it to his diabetes. Patient fell 5 weeks ago while going up stairs and was evaluated in the ED, but he had no evidence of fractures. He has been eating and drinking well, no increased weakness or fatigue. His only complaint is this right lower extremity pain which started 2 weeks ago and has gradually become worse. Pain is sharp and shooting from his right lateral thigh to the end of his right stump. Wearing his prosthetic makes the pain worse, using ibuprofen makes the pain better. He does not feel Lyrica helps. At its worst, pain is a 9/10 and improves to a 6/10 with ibuprofen.   Right lower extremity pain was felt to be secondary to diabetic neuropathy given symptoms and chronicity of arterial occlusion. Vascular has arranged for outpatient follow up later this month. From the ED, we continue ibuprofen and Lyrica 50 mg TID; however, his pain persists and exam is unchanged.   For his lactic acidosis of 3.56, the following results from the ED: vital signs are normal and stable, other labs are within normal limits- no leukocytosis, hemoglobin at baseline of 10.9, mild hyponatremia of 131, mild  decreased bicarb of 21, mildly decreased chloride of 98 make Type B lactic acidosis more likely. Differential included medication induced, infection, acute ischemia, DKA, alcohol induced, and malignancy. We doubted infection as patient was afebrile, without leukocytosis, and had no complaints other than leg pain. We doubted acute ischemia as patient was cleared by vascular surgery, occlusion appears to be old on comparison with prior imaging, and extremity is warm and  well perfused. We doubted DKA as glucose was only mildly elevated at 294 and patient is chronically uncontrolled. Anion gap was 12. Doubted alcohol induced as patient denied any alcohol use. We believe the most likely cause of lactic acidosis is medication induced. He was on metformin, and several HIV medications including Viread, Combivir, Isentress. Both Viread and Combivir are know to cause to lactic acidosis as well as metformin. Given life threatening causes of lactic acidosis had been rule out, patient was discharged to home with follow up in our clinic. We discontinued his metformin but continued all other medications as prescribed. Repeat lactic acid on Monday was 20.9 mg/dL which converts to 1.15 mmol/L. Repeat lactic acid today was 2.5 mmol/L which is improved from 3.5 after discontinuing metformin. This appears to be consistent with medication induced lactic acidosis.    Past Medical History  Diagnosis Date  . Diabetes mellitus   . HIV infection ([place])     undetectable viral load and CD4 ct 667 as of 11/2011  . Colon polyps     noted previous colonoscopy [facility]  . Hyperlipidemia   . Multiple sclerosis ([place])     in remission as of 11/2011 (diagnosed late 1980s)  . GERD (gastroesophageal reflux disease)   . Hypertension   . MRSA (methicillin resistant Staphylococcus aureus)   . 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 History of syphilis     noted [facility] records  . Asthma     per 2003 [facility] pulm records pfts   . DDD (degenerative disc disease)     cervical spine  . PVD (peripheral vascular disease) ([place])     Left Stent 07/02/2008  . Depression   . Pneumonia   . UTI (urinary tract infection)   . Clotting disorder ([place])   . Blood dyscrasia     HIV  . Fever     unknwon origin  . Hep C w/o coma, chronic ([place])   . Pain in limb-Right Leg 10/13/2013    Outpatient Encounter Prescriptions as of 03/10/2015  Medication Sig Note  . atorvastatin (LIPITOR) 40 MG tablet TAKE 1  TABLET BY MOUTH DAILY   . BD PEN NEEDLE NANO U/F 32G X 4 MM MISC USE AS DIRECTED FOUR TIMES DAILY   . Blood Glucose Monitoring Suppl (BAYER CONTOUR MONITOR) W/DEVICE KIT Use to check blood sugar as instructed up to 4 times a day   . clopidogrel (PLAVIX) 75 MG tablet Take 1 tablet (75 mg total) by mouth daily.   Marland Kitchen glucose blood (BAYER CONTOUR NEXT TEST) test strip Four times daily before meals and at bedtime, insulin dependent, ICD-10 CODE E11.42   . ibuprofen (ADVIL,MOTRIN) 200 MG tablet Take 400-600 mg by mouth every 6 (six) hours as needed.   . insulin glargine (LANTUS) 100 UNIT/ML injection Inject 0.73 mLs (73 Units total) into the skin at bedtime. (Patient taking differently: Inject 67 Units into the skin at bedtime. ) 08/28/2014: Pt taking 67 units   . insulin lispro (HUMALOG KWIKPEN) 100 UNIT/ML KiwkPen 26 three times a day  with meals (Patient taking differently: 28 Units. 26 three times a day with meals)   . Insulin Pen Needle (PEN NEEDLES 3/16") 31G X 5 MM MISC 1 application by Does not apply route 4 (four) times daily. Use to inject insulin twice daily Dx code 250.00   . ISENTRESS 400 MG tablet TAKE 1 TABLET BY MOUTH TWICE DAILY   . lamiVUDine-zidovudine (COMBIVIR) 150-300 MG per tablet TAKE 1 TABLET BY MOUTH TWICE DAILY   . Lancets MISC Use to Test Blood Sugar Three Times Daily. Dx Code: 250.02   . lisinopril (PRINIVIL,ZESTRIL) 40 MG tablet Take 1 tablet (40 mg total) by mouth daily.   Marland Kitchen omeprazole (PRILOSEC) 40 MG capsule TAKE 1 CAPSULE BY MOUTH DAILY   . Oxycodone HCl 10 MG TABS Take 1 tablet (10 mg total) by mouth 3 (three) times daily as needed.   . pregabalin (LYRICA) 100 MG capsule Take 1 capsule (100 mg total) by mouth 3 (three) times daily.   Marland Kitchen VIREAD 300 MG tablet TAKE 1 TABLET BY MOUTH DAILY   . [DISCONTINUED] Oxycodone HCl 10 MG TABS Take 1 tablet (10 mg total) by mouth 3 (three) times daily as needed.   . [DISCONTINUED] pregabalin (LYRICA) 50 MG capsule Take 1 capsule (50 mg  total) by mouth 3 (three) times daily.    No facility-administered encounter medications on file as of 03/10/2015.    Family History  Problem Relation Age of Onset  . Diabetes Mother   . Coronary artery disease Mother   . Cancer Brother     colon caner stage 4 as of 11/2011 (unknown age of onset)  . Prostate cancer Brother   . Coronary artery disease Father     Social History   Social History  . Marital Status: Single    Spouse Name: N/A  . Number of Children: N/A  . Years of Education: N/A   Occupational History  . Not on file.   Social History Main Topics  . Smoking status: Former Smoker    Quit date: 05/09/2007  . Smokeless tobacco: Never Used  . Alcohol Use: No  . Drug Use: No  . Sexual Activity: Not on file   Other Topics Concern  . Not on file   Social History Narrative    Review of Systems General: Denies fever, chills, fatigue  Respiratory: Denies SOB, cough   Cardiovascular: Denies chest pain and palpitations.  Gastrointestinal: Denies nausea, vomiting, abdominal pain, diarrhea Genitourinary: Denies dysuria, urgency, frequency Musculoskeletal: Admits to myalgias and pain in right lower extremity with edema.  Skin: Denies pallor, rash and wounds.  Neurological: Denies dizziness, headaches, weakness, lightheadedness, numbness    Objective:   Physical Exam Filed Vitals:   03/10/15 1530  BP: 129/59  Pulse: 72  Temp: 97.9 F (36.6 C)  TempSrc: Oral  Height: '6\' 1"'  (1.854 m)  Weight: 161 lb 14.4 oz (73.437 kg)  SpO2: 97%    General: Vital signs reviewed. Patient is well-developed and well-nourished, in no acute distress and cooperative with exam.  Cardiovascular: RRR, S1 normal, S2 normal, no murmurs, gallops, or rubs. Pulmonary/Chest: Clear to auscultation bilaterally, no wheezes, rales, or rhonchi. Abdominal: Soft, protuberant, non-tender, BS + Extremities: Mild tenderness on palpation of right proximal lower extremity. RLE is warm, well perfused,  no obvious lesions, cyanosis or ecchymosis. +non-pitting edema in RLE, absent right femoral pulse, normal sensation. No obvious areas of fluctuation, abscess or ulceration. Temperature is similar bilaterally. Minimal increased erythema of RLE, but patient had been  rubbing leg not consistent with infection. Psychiatric: Normal mood and affect. speech and behavior is normal. Cognition and memory are normal.      Assessment & Plan:   Please see problem based assessment and plan for more information.

## 2015-03-10 NOTE — Assessment & Plan Note (Signed)
Per my consult note on 03/05/15: Patient originally presented to [facility] on 03/02/15 with complaint of right lower extremity pain. Patient is s/p AKA on the right. Per [facility] note "Patient states his amputation surgery was 7 years ago and he never had any problems in the past. States for the past 10 days he has been experiencing a "shocking pain" in the stump that radiates up the lateral aspect of his thigh. Reports that the pain has been getting progressively worse. States his prosthetic leg fits him correctly and he never had any problems in the past. Denies any history of injury to the area. Patient does have a history of MS and states it is currently in remission. He is a former smoker, quit 7 years ago. Reports being on Plavix due to history of blood clots in the contralateral leg." Arterial blood flow study was ordered and patient was started on Lyrica 50 mg TID for neuropathic pain. Test was completed on 03/05/15 which showed an occluded right common femoral artery, occluded femoral artery with stent, and occluded bypass graft. Unsure if these were previously documented as occluded or if this is a new occurrence. There is a trace amount of flow in the right profunda femoral artery proximal, but no other flow identified. Patient was then instructed to go to the ED. In the ED, patient was evaluated by vascular surgery who felt this issue was chronic and there was not evidence of an acute arterial occlusion as his arteriogram from 2013 also showed a right external iliac artery occlusion. Vascular surgery recommended discharge to home with follow up with them as outpatient, avoid wearing prosthesis for now, and repeating arteriogram if symptoms do not improve. Physical exam was reassuring. Patient continues to have right lower extremity pain which started 2 weeks ago and has gradually become worse. Pain is sharp and shooting from his right lateral thigh to the end of his right stump. Wearing his prosthetic makes the  pain worse, using ibuprofen makes the pain better. He does not feel Lyrica helps. At its worst, pain is a 9/10 and improves to a 6/10 with ibuprofen. Right lower extremity pain was felt to be secondary to diabetic neuropathy given symptoms and chronicity of arterial occlusion. Vascular has arranged for outpatient follow up later this month. From the ED, we continue ibuprofen and Lyrica 50 mg TID; however, his pain persists and exam is unchanged. Pain is more muscular now with some components of neuropathy.   Plan: -Increase Lyrica to 100 mg TID -Refill oxycodone 10 mg TID prn pain -Follow up with vascular surgery -Continue ibuprofen as needed -If pain persists without improvement consider imaging with CT versus MRI

## 2015-03-11 ENCOUNTER — Telehealth: Payer: Self-pay | Admitting: Internal Medicine

## 2015-03-11 NOTE — Telephone Encounter (Signed)
The pharmacy has the rx but the pt wants it filled 8 days early. Usally the pharmacy has a lead way of 3 days. But in order to refill the rx he received yesterday; they need an OK from the doctor since it will be 8 days early. Thanks

## 2015-03-11 NOTE — Telephone Encounter (Signed)
Talked pharmacist @ [facility] - pt wants Oxycodone refilled early; last refill was Oct 12 - 8 days early. Thanks

## 2015-03-11 NOTE — Telephone Encounter (Signed)
Hi Glenda, It looks like Mr. Tooley saw Dr. Marvel Plan yesterday in clinic and gave him a refill for Oxycodone 10mg  so he should have a prescription I think. Let me know if not.  Thanks, Mitzi Hansen

## 2015-03-11 NOTE — Telephone Encounter (Signed)
Cater from [facility] called regarding about pt wanting oxycodone to be filled early. Please call back.

## 2015-03-11 NOTE — Telephone Encounter (Signed)
That is fine as he has had acute pain more so than baseline recently.

## 2015-03-11 NOTE — Telephone Encounter (Signed)
[facility] pharmacy called ; left message ok to refill Oxycodone early per Dr Juleen China.

## 2015-03-15 NOTE — Progress Notes (Signed)
I saw and evaluated the patient. I personally confirmed the key portions of Dr. Nena Alexander history and exam and reviewed pertinent patient test results. The assessment, diagnosis, and plan were formulated together and I agree with the documentation in the resident's note.  If pain not improved with increase in neuropathic medication dose will consider further imaging (MRI Vs CT) to make sure we are not missing a soft tissue process.

## 2015-03-17 ENCOUNTER — Other Ambulatory Visit: Payer: Medicare Other

## 2015-03-17 ENCOUNTER — Other Ambulatory Visit (HOSPITAL_COMMUNITY)
Admission: RE | Admit: 2015-03-17 | Discharge: 2015-03-17 | Disposition: A | Discharge status: Home / Self Care | Payer: Medicare Other | Source: Ambulatory Visit | Attending: Infectious Disease | Admitting: Infectious Disease

## 2015-03-17 DIAGNOSIS — E785 Hyperlipidemia, unspecified: Secondary | ICD-10-CM

## 2015-03-17 DIAGNOSIS — Z113 Encounter for screening for infections with a predominantly sexual mode of transmission: Secondary | ICD-10-CM | POA: Diagnosis not present

## 2015-03-17 DIAGNOSIS — B20 Human immunodeficiency virus [HIV] disease: Secondary | ICD-10-CM | POA: Diagnosis not present

## 2015-03-17 LAB — COMPLETE METABOLIC PANEL WITH GFR
ALT: 29 U/L (ref 9–46)
AST: 24 U/L (ref 10–35)
Albumin: 4 g/dL (ref 3.6–5.1)
Alkaline Phosphatase: 80 U/L (ref 40–115)
BILIRUBIN TOTAL: 0.4 mg/dL (ref 0.2–1.2)
BUN: 12 mg/dL (ref 7–25)
CHLORIDE: 101 mmol/L (ref 98–110)
CO2: 23 mmol/L (ref 20–31)
Calcium: 9.3 mg/dL (ref 8.6–10.3)
Creat: 0.91 mg/dL (ref 0.70–1.25)
GFR, EST NON AFRICAN AMERICAN: 88 mL/min (ref >=60)
Glucose, Bld: 378 mg/dL — ABNORMAL HIGH (ref 65–99)
POTASSIUM: 5 mmol/L (ref 3.5–5.3)
Sodium: 137 mmol/L (ref 135–146)
Total Protein: 6.8 g/dL (ref 6.1–8.1)

## 2015-03-17 LAB — CBC WITH DIFFERENTIAL/PLATELET
BASOS PCT: 0 % (ref 0–1)
Basophils Absolute: 0 10*3/uL (ref 0.0–0.1)
EOS ABS: 0.2 10*3/uL (ref 0.0–0.7)
EOS PCT: 3 % (ref 0–5)
HCT: 32 % — ABNORMAL LOW (ref 39.0–52.0)
Hemoglobin: 10.5 g/dL — ABNORMAL LOW (ref 13.0–17.0)
LYMPHS ABS: 2.9 10*3/uL (ref 0.7–4.0)
LYMPHS PCT: 52 % — AB (ref 12–46)
MCH: 32.8 pg (ref 26.0–34.0)
MCHC: 32.8 g/dL (ref 30.0–36.0)
MCV: 100 fL (ref 78.0–100.0)
MONOS PCT: 7 % (ref 3–12)
MPV: 8.9 fL (ref 8.6–12.4)
Monocytes Absolute: 0.4 10*3/uL (ref 0.1–1.0)
NEUTROS PCT: 38 % — AB (ref 43–77)
Neutro Abs: 2.1 10*3/uL (ref 1.7–7.7)
PLATELETS: 222 10*3/uL (ref 150–400)
RBC: 3.2 MIL/uL — ABNORMAL LOW (ref 4.22–5.81)
RDW: 14 % (ref 11.5–15.5)
WBC: 5.6 10*3/uL (ref 4.0–10.5)

## 2015-03-17 LAB — LIPID PANEL
CHOL/HDL RATIO: 4.6 ratio (ref <=5.0)
CHOLESTEROL: 120 mg/dL — AB (ref 125–200)
HDL: 26 mg/dL — AB (ref >=40)
LDL Cholesterol: 54 mg/dL (ref <130)
TRIGLYCERIDES: 200 mg/dL — AB (ref <150)
VLDL: 40 mg/dL — AB (ref <30)

## 2015-03-17 NOTE — Addendum Note (Signed)
Addended by: Dolan Amen D on: 03/17/2015 04:58 PM   Modules accepted: Orders

## 2015-03-17 NOTE — Addendum Note (Signed)
Addended by: Romana Juniper D on: 03/17/2015 11:14 AM   Modules accepted: Orders

## 2015-03-18 LAB — HEPATITIS C ANTIBODY: HCV AB: NEGATIVE

## 2015-03-18 LAB — MICROALBUMIN / CREATININE URINE RATIO
CREATININE, URINE: 74 mg/dL (ref 20–370)
MICROALB UR: 0.7 mg/dL
Microalb Creat Ratio: 9 mcg/mg creat (ref <30)

## 2015-03-18 LAB — HIV-1 RNA QUANT-NO REFLEX-BLD
HIV 1 RNA Quant: <20 copies/mL (ref <20)
HIV-1 RNA Quant, Log: <1.3 Log copies/mL (ref <1.30)

## 2015-03-18 LAB — URINE CYTOLOGY ANCILLARY ONLY
CHLAMYDIA, DNA PROBE: NEGATIVE
NEISSERIA GONORRHEA: NEGATIVE

## 2015-03-18 LAB — T-HELPER CELL (CD4) - (RCID CLINIC ONLY)
CD4 % Helper T Cell: 21 % — ABNORMAL LOW (ref 33–55)
CD4 T CELL ABS: 630 /uL (ref 400–2700)

## 2015-03-18 LAB — RPR

## 2015-03-22 ENCOUNTER — Telehealth: Payer: Self-pay | Admitting: *Deleted

## 2015-03-22 ENCOUNTER — Other Ambulatory Visit: Payer: Self-pay | Admitting: *Deleted

## 2015-03-22 DIAGNOSIS — M79661 Pain in right lower leg: Secondary | ICD-10-CM

## 2015-03-22 MED ORDER — OXYCODONE HCL 10 MG PO TABS: 10.0000 mg | ORAL_TABLET | Freq: Three times a day (TID) | ORAL | Status: DC | PRN

## 2015-03-22 NOTE — Telephone Encounter (Signed)
Fine to refill 

## 2015-03-22 NOTE — Telephone Encounter (Signed)
Patient calling for refill of Oxycodone 10mg  #90. OK to refill? Patient received #30 extra from IM due to increase in pain. Landis Gandy, RN

## 2015-03-22 NOTE — Telephone Encounter (Signed)
Patient called stating he was given a 10 supply of the oxycodone instead of 30. This was an error and patient already picked up the med. Spoke to [facility] and he will need to get the rest in 9 days; as they will not take it back once it leaves the pharmacy. Myrtis Hopping

## 2015-03-30 ENCOUNTER — Encounter: Payer: Self-pay | Admitting: Surgery

## 2015-03-31 ENCOUNTER — Ambulatory Visit (INDEPENDENT_AMBULATORY_CARE_PROVIDER_SITE_OTHER): Payer: Medicare Other | Admitting: Infectious Disease

## 2015-03-31 ENCOUNTER — Telehealth: Payer: Self-pay | Admitting: Dietician

## 2015-03-31 ENCOUNTER — Encounter: Payer: Self-pay | Admitting: Infectious Disease

## 2015-03-31 ENCOUNTER — Telehealth: Payer: Self-pay | Admitting: *Deleted

## 2015-03-31 VITALS — BP 167/79 | HR 75 | Temp 97.4°F | Ht 73.0 in | Wt 164.0 lb

## 2015-03-31 DIAGNOSIS — E872 Acidosis, unspecified: Secondary | ICD-10-CM

## 2015-03-31 DIAGNOSIS — E1142 Type 2 diabetes mellitus with diabetic polyneuropathy: Secondary | ICD-10-CM

## 2015-03-31 DIAGNOSIS — M79661 Pain in right lower leg: Secondary | ICD-10-CM | POA: Diagnosis not present

## 2015-03-31 DIAGNOSIS — E88A Wasting disease (syndrome) due to underlying condition: Secondary | ICD-10-CM

## 2015-03-31 DIAGNOSIS — I70213 Atherosclerosis of native arteries of extremities with intermittent claudication, bilateral legs: Secondary | ICD-10-CM

## 2015-03-31 DIAGNOSIS — E785 Hyperlipidemia, unspecified: Secondary | ICD-10-CM | POA: Diagnosis not present

## 2015-03-31 DIAGNOSIS — I10 Essential (primary) hypertension: Secondary | ICD-10-CM | POA: Diagnosis not present

## 2015-03-31 DIAGNOSIS — B20 Human immunodeficiency virus [HIV] disease: Secondary | ICD-10-CM | POA: Diagnosis present

## 2015-03-31 DIAGNOSIS — Z113 Encounter for screening for infections with a predominantly sexual mode of transmission: Secondary | ICD-10-CM | POA: Diagnosis not present

## 2015-03-31 DIAGNOSIS — A539 Syphilis, unspecified: Secondary | ICD-10-CM

## 2015-03-31 DIAGNOSIS — Z89611 Acquired absence of right leg above knee: Secondary | ICD-10-CM

## 2015-03-31 DIAGNOSIS — R64 Cachexia: Secondary | ICD-10-CM | POA: Diagnosis not present

## 2015-03-31 MED ORDER — EMTRICITABINE-TENOFOVIR AF 200-25 MG PO TABS: 1.0000 | ORAL_TABLET | Freq: Every day | ORAL | Status: DC

## 2015-03-31 MED ORDER — RALTEGRAVIR POTASSIUM 400 MG PO TABS: 400.0000 mg | ORAL_TABLET | Freq: Two times a day (BID) | ORAL | Status: DC

## 2015-03-31 MED ORDER — ZIDOVUDINE 300 MG PO TABS: 300.0000 mg | ORAL_TABLET | Freq: Every day | ORAL | Status: DC

## 2015-03-31 MED ORDER — ZIDOVUDINE 300 MG PO TABS: 300.0000 mg | ORAL_TABLET | Freq: Two times a day (BID) | ORAL | Status: DC

## 2015-03-31 MED ORDER — OXYCODONE HCL 10 MG PO TABS: 10.0000 mg | ORAL_TABLET | Freq: Three times a day (TID) | ORAL | Status: DC | PRN

## 2015-03-31 NOTE — Telephone Encounter (Signed)
CDE called to follow up with patient since his A1C is > 9%. He did not go to his endocrinology appointment in September because he did not know about it. He is very concerned that since he has been off the metformin his blood sugar have increased and are now staying in the 400s and 500s. He says other than being thirsty and peeing a lot, and pain in his leg, he is not dizzy  otherwise feels okay.  He would appreciate an appointment with Dr. Juleen China or another doctor next week Thursday or Friday to help him with his blood sugars.  CDE reviewed his insulin doses- he is taking 67 units of lantus and was unaware that he should be taking 73 units. CDE encouraged him to increase his lantus to 73 units as ordered by Dr. Aundra Dubin. He is taking 28 units of Humalog three times a day. CDE encouraged him to drink plenty of liquids.   CDE told him our office would be in touch with him by next Monday, hopefully with an appointment. Spoke with Triage nurse who is making an appointment for next week and referral is being given to referral coordinator .

## 2015-03-31 NOTE — Telephone Encounter (Signed)
Talked with pt per D. Plyler about an appt with Dr Juleen China sch 04/08/15 2:15PM - to bring CBG meter. Aware Mamie is working on referral 08/2014 with Dr Buddy Duty. Pt was not aware of letter sent to him about referral. Hilda Blades Neelie Welshans RN 03/31/15 3:50PM

## 2015-03-31 NOTE — Progress Notes (Signed)
Chief complaint: Right stump pain  Subjective:    Patient ID: Nathan [name], male    DOB: [birth year], 64 y.o.   MRN: [id number]  HPI   Nathan [name] is a 64 y.o. male whose HIV has been perfectly suppressed with salvage regimen of  twice daily combivir, isentress and once daily viread with undetectable viral load and health cd4 count. (he has EXTENSIVE R with R to all NNRTI, R to nearly all PI except DRV, with still activity from TNF, AZT and the isentress .  Lab Results  Component Value Date   HIV1RNAQUANT <20 03/17/2015   Lab Results  Component Value Date   CD4TABS 630 03/17/2015   CD4TABS 550 09/09/2014   CD4TABS 670 03/11/2014    Nathan [name] also has comorbid PVD and underwent an above-the-knee and dictation was stenting of his vessels.  He was seen by primary care with internal medicine at Doppler was done which showed no flow in the right common femoral artery or superficial femoral artery. He was sent to the emergency department and evaluated by vascular surgery. They felt that the pathology in the right AKA vascular system was chronic and in a nature. They noted a prior occluded external iliac artery in 2013. They felt satisfied with perfusion of the AKA site.  Note during that visit to the emerge department the patient's lactic acid was elevated at 3.56. When checked 3 days later on October 31 risen to 20.9. Internal medicine stopped his metformin thinking this might cause elevation in his lactate and after follow-up check acting as it had dropped to 2.5. They asked whether or not we should stop one of his antiretrovirals which I did not think should be done. At the time I was thinking maybe the metformin could very well been the culprit given that the lactic acidosis resolved with discontinuation of the metformin. However given the acute severe pain he has been having along with pathology seen on vascular studies I have concerns that is lactic acidosis could be more likely due to  ischemia in his right AKA site. He currently is wearing a sleeve again over the site but is incredibly painful him to wear the sleeve and he absolutely cannot wear his prosthesis. He is scheduled to see vascular surgery on Monday.  Past Medical History  Diagnosis Date  . Diabetes mellitus   . HIV infection ([place])     undetectable viral load and CD4 ct 667 as of 11/2011  . Colon polyps     noted previous colonoscopy [facility]  . Hyperlipidemia   . Multiple sclerosis ([place])     in remission as of 11/2011 (diagnosed late 1980s)  . GERD (gastroesophageal reflux disease)   . Hypertension   . MRSA (methicillin resistant Staphylococcus aureus)   . 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[place])     2002  . History of syphilis     noted [facility] records  . Asthma     per 2003 [facility] pulm records pfts   . DDD (degenerative disc disease)     cervical spine  . PVD (peripheral vascular disease) ([place])     Left Stent 07/02/2008  . Depression   . Pneumonia   . UTI (urinary tract infection)   . Clotting disorder ([place])   . Blood dyscrasia     HIV  . Fever     unknwon origin  . Hep C w/o coma, chronic ([place])   . Pain in limb-Right Leg 10/13/2013    Past Surgical History  Procedure Laterality Date  . Other surgical history      right lower ext AKA with prothesis   . Other surgical history      left left with stents (Dr. Seward Speck)  . Other surgical history      2003 colonoscopy 5 mm polyp transverse colon; (2)7mm  polyps in rectum-hyperplastic  . Above knee leg amputation      Right Leg  . Lower extremity angiogram Left 12/26/2011    Procedure: LOWER EXTREMITY ANGIOGRAM;  Surgeon: Serafina Mitchell, MD;  Location: [facility] CATH LAB;  Service: Cardiovascular;  Laterality: Left;    Family History  Problem Relation Age of Onset  . Diabetes Mother   . Coronary artery disease Mother   . Cancer Brother     colon caner stage 4 as of 11/2011 (unknown age of onset)  . Prostate cancer Brother   . Coronary  artery disease Father       Social History   Social History  . Marital Status: Single    Spouse Name: N/A  . Number of Children: N/A  . Years of Education: N/A   Social History Main Topics  . Smoking status: Former Smoker    Quit date: 05/09/2007  . Smokeless tobacco: Never Used  . Alcohol Use: No  . Drug Use: No  . Sexual Activity: Not Asked   Other Topics Concern  . None   Social History Narrative    Allergies  Allergen Reactions  . Sulfonamide Derivatives Hives     Current outpatient prescriptions:  .  atorvastatin (LIPITOR) 40 MG tablet, TAKE 1 TABLET BY MOUTH DAILY, Disp: 90 tablet, Rfl: 2 .  BD PEN NEEDLE NANO U/F 32G X 4 MM MISC, USE AS DIRECTED FOUR TIMES DAILY, Disp: 100 each, Rfl: 0 .  Blood Glucose Monitoring Suppl (BAYER CONTOUR MONITOR) W/DEVICE KIT, Use to check blood sugar as instructed up to 4 times a day, Disp: 1 kit, Rfl: 0 .  clopidogrel (PLAVIX) 75 MG tablet, Take 1 tablet (75 mg total) by mouth daily., Disp: 90 tablet, Rfl: 0 .  emtricitabine-tenofovir AF (DESCOVY) 200-25 MG tablet, Take 1 tablet by mouth daily., Disp: 30 tablet, Rfl: 11 .  glucose blood (BAYER CONTOUR NEXT TEST) test strip, Four times daily before meals and at bedtime, insulin dependent, ICD-10 CODE E11.42, Disp: 120 each, Rfl: 11 .  ibuprofen (ADVIL,MOTRIN) 200 MG tablet, Take 400-600 mg by mouth every 6 (six) hours as needed., Disp: , Rfl:  .  insulin glargine (LANTUS) 100 UNIT/ML injection, Inject 0.73 mLs (73 Units total) into the skin at bedtime. (Patient taking differently: Inject 67 Units into the skin at bedtime. ), Disp: 10 mL, Rfl: 12 .  insulin lispro (HUMALOG KWIKPEN) 100 UNIT/ML KiwkPen, 26 three times a day with meals (Patient taking differently: 28 Units. 26 three times a day with meals), Disp: 15 mL, Rfl: 11 .  Insulin Pen Needle (PEN NEEDLES 3/16") 31G X 5 MM MISC, 1 application by Does not apply route 4 (four) times daily. Use to inject insulin twice daily Dx code  250.00, Disp: 120 each, Rfl: 11 .  Lancets MISC, Use to Test Blood Sugar Three Times Daily. Dx Code: 250.02, Disp: 100 each, Rfl: 3 .  lisinopril (PRINIVIL,ZESTRIL) 40 MG tablet, Take 1 tablet (40 mg total) by mouth daily., Disp: 90 tablet, Rfl: 3 .  omeprazole (PRILOSEC) 40 MG capsule, TAKE 1 CAPSULE BY MOUTH DAILY, Disp: 90 capsule, Rfl: 0 .  Oxycodone HCl 10 MG TABS, Take  1 tablet (10 mg total) by mouth 3 (three) times daily as needed., Disp: 90 tablet, Rfl: 0 .  pregabalin (LYRICA) 100 MG capsule, Take 1 capsule (100 mg total) by mouth 3 (three) times daily., Disp: 90 capsule, Rfl: 0 .  raltegravir (ISENTRESS) 400 MG tablet, Take 1 tablet (400 mg total) by mouth 2 (two) times daily., Disp: 60 tablet, Rfl: 11 .  zidovudine (RETROVIR) 300 MG tablet, Take 1 tablet (300 mg total) by mouth 2 (two) times daily., Disp: 60 tablet, Rfl: 11  Review of Systems  Constitutional: Negative for chills, diaphoresis, activity change, appetite change and unexpected weight change.  HENT: Negative for sinus pressure, sneezing and trouble swallowing.   Eyes: Negative for photophobia and visual disturbance.  Respiratory: Negative for chest tightness and stridor.   Cardiovascular: Negative for palpitations and leg swelling.  Gastrointestinal: Negative for constipation, blood in stool, abdominal distention and anal bleeding.  Genitourinary: Negative for dysuria, hematuria, flank pain and difficulty urinating.  Musculoskeletal: Positive for myalgias and arthralgias. Negative for back pain, joint swelling and gait problem.  Skin: Negative for color change, pallor and wound.  Neurological: Negative for dizziness, tremors, weakness and light-headedness.  Hematological: Negative for adenopathy. Does not bruise/bleed easily.  Psychiatric/Behavioral: Negative for behavioral problems, confusion, sleep disturbance, dysphoric mood, decreased concentration and agitation.       Objective:   Physical Exam  Constitutional:  He is oriented to person, place, and time. He appears well-developed and well-nourished. No distress.  HENT:  Head: Normocephalic and atraumatic.  Mouth/Throat: Oropharynx is clear and moist. No oropharyngeal exudate.  Eyes: Conjunctivae and EOM are normal. No scleral icterus.  Neck: Normal range of motion. Neck supple.  Cardiovascular: Normal rate and regular rhythm.   Pulmonary/Chest: Effort normal. No respiratory distress. He has no wheezes.  Abdominal: He exhibits no distension.  Musculoskeletal: He exhibits no edema or tenderness.  Neurological: He is alert and oriented to person, place, and time.  Skin: Skin is warm and dry. He is not diaphoretic. No erythema. No pallor.  Psychiatric: His behavior is normal. Judgment and thought content normal. He exhibits a depressed mood.        Assessment & Plan:    HIV: highly R virus.   Tweak regimen to BID Isentress with daily Descovy (for better bone, kidney safety) and now BID AZT  Disseminated syphilis with hepatitis: NON REAC (11/09 1014)   DM: followed closely in IM clinic  PVD and concern for ischemia to R AKA site: I am worried with presentation of severe pain in R AKA site, occlusion, elevated lactate that there is NEW pathology here. I renewed his narcotics. He will see [facility] on Monday  Lactic acid: blamed on metformin which is not completely unreasonable though I again have greater concern that it could be due to a perfusion problem and his AKA site. It is certainly not due to his antiretrovirals   HTN: followed by PCP  I spent greater than 25 minutes with the patient including greater than 50% of time in face to face counsel of the patient  His HIV, his new ARV regimen his PVD, lactic acidosis diabetes mellitus and hypertension and in coordination of their care.

## 2015-04-05 ENCOUNTER — Ambulatory Visit (INDEPENDENT_AMBULATORY_CARE_PROVIDER_SITE_OTHER): Payer: Medicare Other | Admitting: Surgery

## 2015-04-05 ENCOUNTER — Encounter: Payer: Self-pay | Admitting: Surgery

## 2015-04-05 VITALS — BP 151/67 | HR 75 | Ht 73.0 in | Wt 160.0 lb

## 2015-04-05 DIAGNOSIS — I70213 Atherosclerosis of native arteries of extremities with intermittent claudication, bilateral legs: Secondary | ICD-10-CM | POA: Diagnosis not present

## 2015-04-05 DIAGNOSIS — M79661 Pain in right lower leg: Secondary | ICD-10-CM | POA: Diagnosis not present

## 2015-04-05 NOTE — Progress Notes (Signed)
Patient name: Nathan [name] MRN: [id number] DOB: 20-Aug-1950 Sex: male     Chief Complaint  Patient presents with  . Re-evaluation    3 wk f/u -eval pain in R AKA site     HISTORY OF PRESENT ILLNESS:  the patient is back for follow-up. He has a history of stenting in his left leg.  His last follow-up appointment was in June 2015.  He had missed his June 2016 appointment. 4 proximally 6 weeks, he has been having pain in his right above-knee amputation stump.  This is allergen so bad that he cannot wear his prosthesis.  He was seen in the emergency department about a month ago.  An ultrasound revealed an occluded common femoral artery.  He was discharged home with Lyrica.  He states that the paresthesias and pain to touch have improved , however he still cannot walk  Past Medical History  Diagnosis Date  . Diabetes mellitus   . HIV infection ([place])     undetectable viral load and CD4 ct 667 as of 11/2011  . Colon polyps     noted previous colonoscopy [facility]  . Hyperlipidemia   . Multiple sclerosis ([place])     in remission as of 11/2011 (diagnosed late 1980s)  . GERD (gastroesophageal reflux disease)   . Hypertension   . MRSA (methicillin resistant Staphylococcus aureus)   . 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[place])     2002  . History of syphilis     noted [facility] records  . Asthma     per 2003 [facility] pulm records pfts   . DDD (degenerative disc disease)     cervical spine  . PVD (peripheral vascular disease) ([place])     Left Stent 07/02/2008  . Depression   . Pneumonia   . UTI (urinary tract infection)   . Clotting disorder ([place])   . Blood dyscrasia     HIV  . Fever     unknwon origin  . Hep C w/o coma, chronic ([place])   . Pain in limb-Right Leg 10/13/2013    Past Surgical History  Procedure Laterality Date  . Other surgical history      right lower ext AKA with prothesis   . Other surgical history      left left with stents (Dr. Seward Speck)  . Other surgical history       2003 colonoscopy 5 mm polyp transverse colon; (2)42m  polyps in rectum-hyperplastic  . Above knee leg amputation      Right Leg  . Lower extremity angiogram Left 12/26/2011    Procedure: LOWER EXTREMITY ANGIOGRAM;  Surgeon: VSerafina Mitchell MD;  [facility];  Service: Cardiovascular;  Laterality: Left;    Social History   Social History  . Marital Status: Single    Spouse Name: N/A  . Number of Children: N/A  . Years of Education: N/A   Occupational History  . Not on file.   Social History Main Topics  . Smoking status: Former Smoker    Quit date: 05/09/2007  . Smokeless tobacco: Never Used  . Alcohol Use: No  . Drug Use: No  . Sexual Activity: Not on file   Other Topics Concern  . Not on file   Social History Narrative    Family History  Problem Relation Age of Onset  . Diabetes Mother   . Coronary artery disease Mother   . Cancer Brother     colon caner stage 4 as of  11/2011 (unknown age of onset)  . Prostate cancer Brother   . Coronary artery disease Father     Allergies as of 04/05/2015 - Review Complete 04/05/2015  Allergen Reaction Noted  . Sulfonamide derivatives Hives 02/12/2006    Current Outpatient Prescriptions on File Prior to Visit  Medication Sig Dispense Refill  . atorvastatin (LIPITOR) 40 MG tablet TAKE 1 TABLET BY MOUTH DAILY 90 tablet 2  . BD PEN NEEDLE NANO U/F 32G X 4 MM MISC USE AS DIRECTED FOUR TIMES DAILY 100 each 0  . Blood Glucose Monitoring Suppl (BAYER CONTOUR MONITOR) W/DEVICE KIT Use to check blood sugar as instructed up to 4 times a day 1 kit 0  . clopidogrel (PLAVIX) 75 MG tablet Take 1 tablet (75 mg total) by mouth daily. 90 tablet 0  . emtricitabine-tenofovir AF (DESCOVY) 200-25 MG tablet Take 1 tablet by mouth daily. 30 tablet 11  . glucose blood (BAYER CONTOUR NEXT TEST) test strip Four times daily before meals and at bedtime, insulin dependent, ICD-10 CODE E11.42 120 each 11  . ibuprofen (ADVIL,MOTRIN) 200 MG  tablet Take 400-600 mg by mouth every 6 (six) hours as needed.    . insulin glargine (LANTUS) 100 UNIT/ML injection Inject 0.73 mLs (73 Units total) into the skin at bedtime. (Patient taking differently: Inject 67 Units into the skin at bedtime. ) 10 mL 12  . insulin lispro (HUMALOG KWIKPEN) 100 UNIT/ML KiwkPen 26 three times a day with meals (Patient taking differently: 28 Units. 26 three times a day with meals) 15 mL 11  . Insulin Pen Needle (PEN NEEDLES 3/16") 31G X 5 MM MISC 1 application by Does not apply route 4 (four) times daily. Use to inject insulin twice daily Dx code 250.00 120 each 11  . Lancets MISC Use to Test Blood Sugar Three Times Daily. Dx Code: 250.02 100 each 3  . lisinopril (PRINIVIL,ZESTRIL) 40 MG tablet Take 1 tablet (40 mg total) by mouth daily. 90 tablet 3  . omeprazole (PRILOSEC) 40 MG capsule TAKE 1 CAPSULE BY MOUTH DAILY 90 capsule 0  . Oxycodone HCl 10 MG TABS Take 1 tablet (10 mg total) by mouth 3 (three) times daily as needed. 90 tablet 0  . pregabalin (LYRICA) 100 MG capsule Take 1 capsule (100 mg total) by mouth 3 (three) times daily. 90 capsule 0  . raltegravir (ISENTRESS) 400 MG tablet Take 1 tablet (400 mg total) by mouth 2 (two) times daily. 60 tablet 11  . zidovudine (RETROVIR) 300 MG tablet Take 1 tablet (300 mg total) by mouth 2 (two) times daily. 60 tablet 11   No current facility-administered medications on file prior to visit.     REVIEW OF SYSTEMS:  see history of present illness, otherwise negative  PHYSICAL EXAMINATION:   Vital signs are  Filed Vitals:   04/05/15 0849 04/05/15 0852  BP: 159/70 151/67  Pulse: 75   Height: 6' 1" (1.854 m)   Weight: 160 lb (72.576 kg)   SpO2: 99%    Body mass index is 21.11 kg/(m^2). General: The patient appears their stated age. HEENT:  No gross abnormalities Pulmonary:  Non labored breathing Musculoskeletal: There are no major deformities. Neurologic: No focal weakness or paresthesias are  detected, Skin: There are no ulcer or rashes noted. Psychiatric: The patient has normal affect. Cardiovascular:  Right femoral pulses not palpable the amputation stump is without ulcer.  It is warm  Diagnostic Studies  none  Assessment:  painful right above-knee amputation stump Plan:  I reviewed a CT scan from 2014 which also shows an occluded right common femoral artery as well as an external iliac artery.  I do not think the vascular occlusion is therefore acute. He does not have any signs or symptoms of ischemia.  His penis complaint is that of pain which I feel is both muscular as well as neuropathic.  I have encouraged him to continue his Lyrica, as it does appear to be making a difference. I'm also going to send him for physical therapy to see if they can help with his issues.  He will come back in 3 months for follow-up.  At that time I'll also get a left leg duplex  V. Leia Alf, M.D. [facility] Office: [phone] Pager:  [phone]

## 2015-04-05 NOTE — Addendum Note (Signed)
Addended by: Dorthula Rue L on: 04/05/2015 10:11 AM   Modules accepted: Orders

## 2015-04-08 ENCOUNTER — Encounter: Payer: Self-pay | Admitting: Internal Medicine

## 2015-04-08 ENCOUNTER — Ambulatory Visit (INDEPENDENT_AMBULATORY_CARE_PROVIDER_SITE_OTHER): Payer: Medicare Other | Admitting: Internal Medicine

## 2015-04-08 VITALS — BP 130/59 | HR 74 | Temp 97.9°F | Wt 161.3 lb

## 2015-04-08 DIAGNOSIS — Z87891 Personal history of nicotine dependence: Secondary | ICD-10-CM | POA: Diagnosis not present

## 2015-04-08 DIAGNOSIS — M79661 Pain in right lower leg: Secondary | ICD-10-CM

## 2015-04-08 DIAGNOSIS — M79604 Pain in right leg: Secondary | ICD-10-CM

## 2015-04-08 DIAGNOSIS — E784 Other hyperlipidemia: Secondary | ICD-10-CM

## 2015-04-08 DIAGNOSIS — Z79899 Other long term (current) drug therapy: Secondary | ICD-10-CM | POA: Diagnosis not present

## 2015-04-08 DIAGNOSIS — E1169 Type 2 diabetes mellitus with other specified complication: Secondary | ICD-10-CM

## 2015-04-08 DIAGNOSIS — E785 Hyperlipidemia, unspecified: Secondary | ICD-10-CM

## 2015-04-08 DIAGNOSIS — Z89611 Acquired absence of right leg above knee: Secondary | ICD-10-CM | POA: Diagnosis not present

## 2015-04-08 DIAGNOSIS — I10 Essential (primary) hypertension: Secondary | ICD-10-CM | POA: Diagnosis not present

## 2015-04-08 DIAGNOSIS — E1142 Type 2 diabetes mellitus with diabetic polyneuropathy: Secondary | ICD-10-CM | POA: Diagnosis not present

## 2015-04-08 DIAGNOSIS — Z794 Long term (current) use of insulin: Secondary | ICD-10-CM | POA: Diagnosis not present

## 2015-04-08 DIAGNOSIS — Z21 Asymptomatic human immunodeficiency virus [HIV] infection status: Secondary | ICD-10-CM | POA: Diagnosis not present

## 2015-04-08 DIAGNOSIS — B20 Human immunodeficiency virus [HIV] disease: Secondary | ICD-10-CM

## 2015-04-08 MED ORDER — PREGABALIN 100 MG PO CAPS
100.0000 mg | ORAL_CAPSULE | Freq: Three times a day (TID) | ORAL | Status: DC
Start: 2015-04-08 — End: 2015-07-01

## 2015-04-08 MED ORDER — BAYER MICROLET 2 LANCING DEVIC MISC: Status: AC

## 2015-04-08 MED ORDER — INSULIN LISPRO 100 UNIT/ML (KWIKPEN): 30.0000 [IU] | PEN_INJECTOR | Freq: Three times a day (TID) | SUBCUTANEOUS | Status: DC

## 2015-04-08 MED ORDER — INSULIN GLARGINE 100 UNIT/ML ~~LOC~~ SOLN: 80.0000 [IU] | Freq: Every day | SUBCUTANEOUS | Status: DC

## 2015-04-08 NOTE — Patient Instructions (Signed)
It was nice meeting you today.  I have increased your insulin doses.  Lantus: take 80 units at bedtime Humalog: take 30 units three times a day with meals  This prescription has also been sent to your pharmacy.  When doing your insulin, please inject at different sites each time.  A refill on your lyrica has also been given to you.  Please follow up in 3 months.

## 2015-04-09 NOTE — Assessment & Plan Note (Signed)
BP Readings from Last 3 Encounters:  04/08/15 130/59  04/05/15 151/67  03/31/15 167/79    Lab Results  Component Value Date   NA 137 03/17/2015   K 5.0 03/17/2015   CREATININE 0.91 03/17/2015    Assessment: Blood pressure control:controlled today Progress toward BP goal:   at goal today Comments: BP is below goal today, however, prior office visits this has not been the case recently, possibly due to pain from AKA site  Plan: Medications:  continue current medications Lisinopril 40 mg  Other plans: RTC 3 months

## 2015-04-09 NOTE — Progress Notes (Signed)
Patient ID: Nathan [name], male   DOB: Jun 03, 1950, 64 y.o.   MRN: [id number]   Subjective:   Patient ID: Nathan [name] male   DOB: 1950-06-08 64 y.o.   MRN: [id number]  HPI: Nathan [name] is a 64 y.o. man with past medical history as listed below who presents today for follow up of his DM2.  Please see problem list for status of patients chronic medical conditions.    Past Medical History  Diagnosis Date  . Diabetes mellitus   . HIV infection ([place])     undetectable viral load and CD4 ct 667 as of 11/2011  . Colon polyps     noted previous colonoscopy [facility]  . Hyperlipidemia   . Multiple sclerosis ([place])     in remission as of 11/2011 (diagnosed late 1980s)  . GERD (gastroesophageal reflux disease)   . Hypertension   . MRSA (methicillin resistant Staphylococcus aureus)   . 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[place])     2002  . History of syphilis     noted [facility] records  . Asthma     per 2003 [facility] pulm records pfts   . DDD (degenerative disc disease)     cervical spine  . PVD (peripheral vascular disease) ([place])     Left Stent 07/02/2008  . Depression   . Pneumonia   . UTI (urinary tract infection)   . Clotting disorder ([place])   . Blood dyscrasia     HIV  . Fever     unknwon origin  . Hep C w/o coma, chronic ([place])   . Pain in limb-Right Leg 10/13/2013   Current Outpatient Prescriptions  Medication Sig Dispense Refill  . atorvastatin (LIPITOR) 40 MG tablet TAKE 1 TABLET BY MOUTH DAILY 90 tablet 2  . BD PEN NEEDLE NANO U/F 32G X 4 MM MISC USE AS DIRECTED FOUR TIMES DAILY 100 each 0  . Blood Glucose Monitoring Suppl (BAYER CONTOUR MONITOR) W/DEVICE KIT Use to check blood sugar as instructed up to 4 times a day 1 kit 0  . clopidogrel (PLAVIX) 75 MG tablet Take 1 tablet (75 mg total) by mouth daily. 90 tablet 0  . emtricitabine-tenofovir AF (DESCOVY) 200-25 MG tablet Take 1 tablet by mouth daily. 30 tablet 11  . glucose blood (BAYER CONTOUR NEXT TEST) test strip Four times  daily before meals and at bedtime, insulin dependent, ICD-10 CODE E11.42 120 each 11  . ibuprofen (ADVIL,MOTRIN) 200 MG tablet Take 400-600 mg by mouth every 6 (six) hours as needed.    . insulin glargine (LANTUS) 100 UNIT/ML injection Inject 0.8 mLs (80 Units total) into the skin at bedtime. 10 mL 12  . insulin lispro (HUMALOG KWIKPEN) 100 UNIT/ML KiwkPen Inject 0.3 mLs (30 Units total) into the skin 3 (three) times daily with meals. 15 mL 11  . Insulin Pen Needle (PEN NEEDLES 3/16") 31G X 5 MM MISC 1 application by Does not apply route 4 (four) times daily. Use to inject insulin twice daily Dx code 250.00 120 each 11  . Lancet Devices (BAYER MICROLET 2 LANCING DEVIC) MISC Use to check blood sugar up to 3 ties a day 1 each 2  . Lancets MISC Use to Test Blood Sugar Three Times Daily. Dx Code: 250.02 100 each 3  . lisinopril (PRINIVIL,ZESTRIL) 40 MG tablet Take 1 tablet (40 mg total) by mouth daily. 90 tablet 3  . omeprazole (PRILOSEC) 40 MG capsule TAKE 1 CAPSULE BY MOUTH DAILY 90 capsule 0  .  Oxycodone HCl 10 MG TABS Take 1 tablet (10 mg total) by mouth 3 (three) times daily as needed. 90 tablet 0  . pregabalin (LYRICA) 100 MG capsule Take 1 capsule (100 mg total) by mouth 3 (three) times daily. 90 capsule 2  . raltegravir (ISENTRESS) 400 MG tablet Take 1 tablet (400 mg total) by mouth 2 (two) times daily. 60 tablet 11  . zidovudine (RETROVIR) 300 MG tablet Take 1 tablet (300 mg total) by mouth 2 (two) times daily. 60 tablet 11   No current facility-administered medications for this visit.   Family History  Problem Relation Age of Onset  . Diabetes Mother   . Coronary artery disease Mother   . Cancer Brother     colon caner stage 4 as of 11/2011 (unknown age of onset)  . Prostate cancer Brother   . Coronary artery disease Father    Social History   Social History  . Marital Status: Single    Spouse Name: N/A  . Number of Children: N/A  . Years of Education: N/A   Social History  Main Topics  . Smoking status: Former Smoker    Quit date: 05/09/2007  . Smokeless tobacco: Never Used  . Alcohol Use: No  . Drug Use: No  . Sexual Activity: Not Asked   Other Topics Concern  . None   Social History Narrative   Review of Systems: Review of Systems  Constitutional: Negative for fever and chills.  HENT: Negative for sore throat.   Eyes: Negative for blurred vision.  Respiratory: Negative for cough and shortness of breath.   Cardiovascular: Negative for chest pain.  Gastrointestinal: Negative for nausea, vomiting and diarrhea.  Genitourinary: Positive for frequency. Negative for dysuria.  Musculoskeletal:       Pain in Right AKA site  Skin: Negative for rash.  Neurological: Negative for dizziness, loss of consciousness and headaches.  Endo/Heme/Allergies: Positive for polydipsia.  Psychiatric/Behavioral: Negative for depression.    Objective:  Physical Exam: Filed Vitals:   04/08/15 1505  BP: 130/59  Pulse: 74  Temp: 97.9 F (36.6 C)  TempSrc: Oral  Weight: 161 lb 4.8 oz (73.165 kg)  SpO2: 98%   Physical Exam  Constitutional: He is oriented to person, place, and time.  Well developed, well nourished man ambulating with crutches due to Right AKA  HENT:  Head: Normocephalic and atraumatic.  liposdystrophy present  Eyes: Conjunctivae and EOM are normal.  Neck: Normal range of motion.  Cardiovascular: Normal rate and regular rhythm.   Pulmonary/Chest: Effort normal and breath sounds normal.  Abdominal: Soft. Bowel sounds are normal.  Neurological: He is alert and oriented to person, place, and time.  Psychiatric: He has a normal mood and affect.    Assessment & Plan:   Please see Problem List for Assessment and Plan.  Case discussed with Dr. Eppie Gibson

## 2015-04-09 NOTE — Assessment & Plan Note (Signed)
Assessment: Hgb A1C in October was 9.2.  No medication adjustments made at that time.  Patient recently taken off Metformin due to lactic acidosis and possible inciting agent being metformin.  Patient did not bring meter today but states CBGs are averaging 300-400.  Upper 200s is the lowest a couple times per week.  Patient denies symptoms of hypoglycemia such as dizziness, shakiness, nervousness.  He does report symptoms of hyperglycemia such as increased thirst, urination, and increased appetite.  He currently is on Lantus 73 units qhs and Humalog 28 units tid.  He misses 1-3 doses of Humalog per week.  Plan: - With A1C being well above goal and Metformin being discontinued, I feel it is worth making adjustments to patients medications.  I would be hesitant to restart Metformin given possible side effect of lactic acidosis. - Increase Lantus to 80 units qhs - Increase Humalog to 30 units tid - Continue CBG monitoring at home and reminded to bring meter to next visit - Follow up with Barry Brunner, CDE - RTC in 3 months, recheck A1C

## 2015-04-09 NOTE — Assessment & Plan Note (Signed)
-

## 2015-04-09 NOTE — Assessment & Plan Note (Signed)
Assessment: Patient continues to have pain in his right AKA site x 6 weeks now.  He is unable to wear his prosthesis and was evaluated in the ED a month ago where US showed an occluded common femoral artery.  He was discharged to home with Lyrica which he states at first did not help but has begun to notice significant improvement on this.  Unfortunately, he is still unable to wear prosthesis and mobilizes with assistance of crutches.  He was evaluated by vascular surgery on 11/28 who did not think this was an acute issue and found no evidence of ischemia.  They felt that this was most likely muscular and neuropathic in nature.  Recommended continuing Lyrica and referral to PT.  Plan: - Continue Lyrica 100mg  TID.  Refill given - Continue Oxycodone 10mg  TID prn  - Physical therapy referral - Follow up with vascular surgery

## 2015-04-09 NOTE — Assessment & Plan Note (Signed)
Assessment: Follows with ID, Dr. Tommy Medal.  On Descovy, Raltegravir, and Zidovudine.  Last CD4 in Nov 2016 was 630, VL undetectable.  Plan: - continue follow up with Dr. Tommy Medal

## 2015-04-12 ENCOUNTER — Other Ambulatory Visit: Payer: Self-pay | Admitting: Internal Medicine

## 2015-04-12 DIAGNOSIS — E1142 Type 2 diabetes mellitus with diabetic polyneuropathy: Secondary | ICD-10-CM

## 2015-04-12 MED ORDER — INSULIN GLARGINE 100 UNIT/ML SOLOSTAR PEN: 80.0000 [IU] | PEN_INJECTOR | Freq: Every day | SUBCUTANEOUS | Status: DC

## 2015-04-12 NOTE — Telephone Encounter (Signed)
Dr. Juleen China, can you update this to the lantus pen?  Pt called and is not familiar with the vial, drawing the medication up, and administering this way.

## 2015-04-12 NOTE — Telephone Encounter (Signed)
Left message for patient

## 2015-04-12 NOTE — Telephone Encounter (Signed)
Pt requesting the nurse to call back regarding insulin. °

## 2015-04-23 ENCOUNTER — Ambulatory Visit: Payer: Medicare Other | Admitting: Physical Therapy

## 2015-04-23 ENCOUNTER — Other Ambulatory Visit: Payer: Self-pay | Admitting: *Deleted

## 2015-04-23 MED ORDER — CLOPIDOGREL BISULFATE 75 MG PO TABS: 75.0000 mg | ORAL_TABLET | Freq: Every day | ORAL | Status: DC

## 2015-04-26 ENCOUNTER — Ambulatory Visit: Payer: Medicare Other | Attending: Surgery | Admitting: Physical Therapy

## 2015-04-26 ENCOUNTER — Encounter: Payer: Self-pay | Admitting: Physical Therapy

## 2015-04-26 DIAGNOSIS — T8789 Other complications of amputation stump: Secondary | ICD-10-CM | POA: Diagnosis not present

## 2015-04-26 DIAGNOSIS — W19XXXS Unspecified fall, sequela: Secondary | ICD-10-CM | POA: Diagnosis not present

## 2015-04-26 DIAGNOSIS — M79609 Pain in unspecified limb: Secondary | ICD-10-CM

## 2015-04-26 DIAGNOSIS — G8918 Other acute postprocedural pain: Secondary | ICD-10-CM | POA: Insufficient documentation

## 2015-04-26 DIAGNOSIS — R6889 Other general symptoms and signs: Secondary | ICD-10-CM | POA: Diagnosis not present

## 2015-04-26 DIAGNOSIS — Z89611 Acquired absence of right leg above knee: Secondary | ICD-10-CM | POA: Diagnosis not present

## 2015-04-26 DIAGNOSIS — R29818 Other symptoms and signs involving the nervous system: Secondary | ICD-10-CM | POA: Insufficient documentation

## 2015-04-26 DIAGNOSIS — R2681 Unsteadiness on feet: Secondary | ICD-10-CM | POA: Diagnosis not present

## 2015-04-26 DIAGNOSIS — R269 Unspecified abnormalities of gait and mobility: Secondary | ICD-10-CM | POA: Insufficient documentation

## 2015-04-26 DIAGNOSIS — R2689 Other abnormalities of gait and mobility: Secondary | ICD-10-CM

## 2015-04-26 NOTE — Progress Notes (Signed)
I saw and evaluated the patient.  I personally confirmed the key portions of Dr. Wallace's history and exam and reviewed pertinent patient test results.  The assessment, diagnosis, and plan were formulated together and I agree with the documentation in the resident's note. 

## 2015-04-27 NOTE — Therapy (Signed)
[facility] [address] [place], [place], [address] Phone: [phone]   Fax:  [phone]  Physical Therapy Evaluation  Patient Details  Name: Nathan [name] MRN: [id number] Date of Birth: Jun 15, 1950 Referring Provider: Harold Barban, MD  Encounter Date: 04/26/2015      PT End of Session - 04/26/15 0845    Visit Number 1   Number of Visits 18   Date for PT Re-Evaluation 06/25/15   Authorization Type Medicare G-Code and progress report every 10 visits   PT Start Time 0802   PT Stop Time 0845   PT Time Calculation (min) 43 min   Activity Tolerance Patient limited by pain   Behavior During Therapy [facility] for tasks assessed/performed      Past Medical History  Diagnosis Date  . Diabetes mellitus   . HIV infection ([place])     undetectable viral load and CD4 ct 667 as of 11/2011  . Colon polyps     noted previous colonoscopy [facility]  . Hyperlipidemia   . Multiple sclerosis ([place])     in remission as of 11/2011 (diagnosed late 1980s)  . GERD (gastroesophageal reflux disease)   . Hypertension   . MRSA (methicillin resistant Staphylococcus aureus)   . 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 History of syphilis     noted [facility] records  . Asthma     per 2003 [facility] pulm records pfts   . DDD (degenerative disc disease)     cervical spine  . PVD (peripheral vascular disease) ([place])     Left Stent 07/02/2008  . Depression   . Pneumonia   . UTI (urinary tract infection)   . Clotting disorder ([place])   . Blood dyscrasia     HIV  . Fever     unknwon origin  . Hep C w/o coma, chronic ([place])   . Pain in limb-Right Leg 10/13/2013    Past Surgical History  Procedure Laterality Date  . Other surgical history      right lower ext AKA with prothesis   . Other surgical history      left left with stents (Dr. Seward Speck)  . Other surgical history      2003 colonoscopy 5 mm polyp transverse colon; (2)27mm   polyps in rectum-hyperplastic  . Above knee leg amputation      Right Leg  . Lower extremity angiogram Left 12/26/2011    Procedure: LOWER EXTREMITY ANGIOGRAM;  Surgeon: Serafina Mitchell, MD;  Location[facility] CATH LAB;  Service: Cardiovascular;  Laterality: Left;    There were no vitals filed for this visit.  Visit Diagnosis:  Status post above knee amputation of right lower extremity (HCC)  Abnormality of gait  Painful amputation stump (HCC)  Decreased functional activity tolerance  Unsteadiness  Balance problems  Falls, sequela      Subjective Assessment - 04/26/15 0801    Subjective This 64yo underwent a right Transfemoral Amputation July 2009 due to circulation issues with blood clots. He has been ambulating in community without device. He recieved a microprocessor knee (Rheo 3) on 09/28/2014. He fell 01/26/2015 with severe chest wall contusion. MId-October 2016 he developed severe pain in residual limb pain with swelling prohibiting abiliaty to wear and use prosthesis. Doppler study reveled femoral artery occulsion but vascular surgeon was not a candidate for surgery. He was referred to PT for pain management.    Patient Stated Goals He would like to use prosthesis  daily and walk like he was prior to October   Currently in Pain? Yes   Pain Score 2   In last week, worst 8/10, best 1/10   Pain Location Leg   Pain Orientation Right   Pain Descriptors / Indicators Cramping;Squeezing   Pain Type Acute pain   Pain Onset More than a month ago   Pain Frequency Constant   Aggravating Factors  walking on prosthesis or moving limb with prosthesis   Pain Relieving Factors sitting down, removing prosthesis, not touching it   Effect of Pain on Daily Activities limits walking only started prosthesis in last 3 days, It has been off over 8 weeks.    Multiple Pain Sites Yes   Pain Score 0  in last week worst 6-7/10, best 0/10   Pain Location Leg   Pain Orientation Left   Pain Descriptors /  Indicators Tingling;Numbness;Cramping   Pain Type Chronic pain   Pain Onset More than a month ago   Pain Frequency Intermittent   Aggravating Factors  intermiitent claudication pain   Pain Relieving Factors sitting, rest            [facility] PT Assessment - 04/26/15 0800    Assessment   Medical Diagnosis Pain right residual limb   Referring Provider Harold Barban, MD   Onset Date/Surgical Date 02/20/15  pain onset   Precautions   Precautions Fall   Restrictions   Weight Bearing Restrictions No   Balance Screen   Has the patient fallen in the past 6 months Yes   How many times? 1-2 / week   Has the patient had a decrease in activity level because of a fear of falling?  Yes   Is the patient reluctant to leave their home because of a fear of falling?  Yes   [place] residence   Living Arrangements Non-relatives/Friends   Type of [place] to enter   Entrance Stairs-Number of Steps 4   Entrance Stairs-Rails Left;Right;Can reach both   [place] Two level;1/2 bath on main level   Alternate Level Stairs-Number of Steps 14   Alternate Level Stairs-Rails Right   Prior Function   Level of Independence Independent;Independent with household mobility without device;Independent with community mobility without device   Vocation On disability   Observation/Other Assessments   Focus on Therapeutic Outcomes (FOTO)  NP patient did not want to complete   Posture/Postural Control   Posture/Postural Control Postural limitations   Postural Limitations Rounded Shoulders;Forward head;Weight shift left   ROM / Strength   AROM / PROM / Strength PROM;Strength   PROM   Overall PROM  Deficits   PROM Assessment Site Hip   Right/Left Hip Right   Strength   Overall Strength Deficits   Strength Assessment Site Hip   Right/Left Hip Right;Left   Right Hip Flexion 4/5   Right Hip Extension 3+/5   Right Hip ABduction 3+/5   Left Hip  Flexion 5/5   Left Hip Extension 4/5   Right Hip   Right Hip Extension -20  Thomas position -20*   Right Hip Flexion 94   Right Hip ABduction 28   Right Hip ADduction 14   Palpation   Palpation comment Pain along Iliotibial Band and distal femur especially posterior on right residual limb. He denies pain at Greater Trochanter bursa. Pain with pressure at distal-lateral residual limb   Special Tests    Special Tests Hip Special Tests  Hip Special Tests  Ober's Test;Piriformis Test;Thomas Test   [facility] Test    Findings Positive   Side Right   Ober's Test   Findings Positive   Side Right   Piriformis Test   Findings Negative   Side  Right   Transfers   Transfers Sit to Stand;Stand to Sit   Sit to Stand 6: Modified independent (Device/Increase time);With upper extremity assist;From chair/3-in-1   Stand to Sit 6: Modified independent (Device/Increase time);With upper extremity assist;To chair/3-in-1   Ambulation/Gait   Ambulation/Gait Yes   Ambulation/Gait Assistance 5: Supervision   Ambulation Distance (Feet) 100 Feet   Assistive device Prosthesis;L Axillary Crutch   Gait Pattern Step-through pattern;Decreased step length - left;Decreased stance time - right;Decreased stride length;Decreased hip/knee flexion - right;Decreased weight shift to right;Right circumduction;Right hip hike;Antalgic;Trunk flexed;Abducted- right;Poor foot clearance - right   Ambulation Surface Level;Indoor   Gait velocity 0.98 ft/sec   Standardized Balance Assessment   Standardized Balance Assessment Berg Balance Test   Berg Balance Test   Sit to Stand Able to stand  independently using hands   Standing Unsupported Able to stand safely 2 minutes   Sitting with Back Unsupported but Feet Supported on Floor or Stool Able to sit safely and securely 2 minutes   Stand to Sit Controls descent by using hands   Transfers Able to transfer safely, definite need of hands   Standing Unsupported with Eyes Closed Able  to stand 10 seconds safely   Standing Ubsupported with Feet Together Able to place feet together independently but unable to hold for 30 seconds   From Standing, Reach Forward with Outstretched Arm Can reach forward >5 cm safely (2")   From Standing Position, Pick up Object from Floor Able to pick up shoe, needs supervision   From Standing Position, Turn to Look Behind Over each Shoulder Turn sideways only but maintains balance   Turn 360 Degrees Needs close supervision or verbal cueing   Standing Unsupported, Alternately Place Feet on Step/Stool Needs assistance to keep from falling or unable to try   Standing Unsupported, One Foot in Front Needs help to step but can hold 15 seconds   Standing on One Leg Tries to lift leg/unable to hold 3 seconds but remains standing independently   Total Score 33         Prosthetics Assessment - 04/26/15 0800    Prosthetics   Prosthetic Care Independent with Skin check;Prosthetic cleaning;Ply sock cleaning   Prosthetic Care Dependent with Residual limb care;Correct ply sock adjustment;Proper wear schedule/adjustment;Proper weight-bearing schedule/adjustment   Donning prosthesis  Independent   Doffing prosthesis  Independent   Current prosthetic wear tolerance (days/week)  No wear for >8 weeks since onset of pain in Oct. Prior he wore prosthesis every day. Over last 4 days has resumed wear daily.    Current prosthetic wear tolerance (#hours/day)  Prior to pain onset Oct. 2016 >90% of awake hours, Over last 4 days ~50% of awake hours.    Current prosthetic weight-bearing tolerance (hours/day)  Pain sitting without prosthesis 1-2/10, sitting with prosthesis 3/10, standing for 4 minutes pain 8-9/10 with 3 minutes sitting to recover to 5/10   Edema none present but reports severe swelling with initial pain onset in Oct. 2016   Residual limb condition  no open areas, callous distal-posterior femur area, normal color & temperature,    K code/activity level with  prosthetic use  3  full community with variable cadence  PT Education - 04/26/15 0840    Education provided Yes   Education Details Wear prosthesis 2hrs on, 2hrs off building up number of tolerable reps during day first as increasing wear too rapidly can increase pain patterns; use 2 crutches for all gait currently to limit weight bearing and increasing pain   Person(s) Educated Patient   Methods Explanation   Comprehension Verbalized understanding          PT Short Term Goals - 04/26/15 0845    PT SHORT TERM GOAL #1   Title Patient tolerates wear of prosthesis daily for total time >50% of awake hours. (Target Date: 05/27/2015)   Time 1   Period Months   Status New   PT SHORT TERM GOAL #2   Title Patient reports right limb pain </= 6/10 with 8 minutes of standing or gait activities.  (Target Date: 05/27/2015)   Time 1   Period Months   Status New   PT SHORT TERM GOAL #3   Title Patient ambulates 400' with LRAD & prosthesis with pain increasing <3 increments on 0-10 scale.  (Target Date: 05/27/2015)   Time 1   Period Months   Status New   PT SHORT TERM GOAL #4   Title Patient demonstrates understanding of initial HEP.  (Target Date: 05/27/2015)   Time 1   Period Months   Status New           PT Long Term Goals - 04/26/15 0845    PT LONG TERM GOAL #1   Title Patient tolerates wear of prosthesis daily for >80% of awake hours with limb pain <2/10 sitting or within 5 minutes of sitting down.  (Target Date: 06/25/2015)   Time 2   Period Months   Status New   PT LONG TERM GOAL #2   Title Patient reports right limb pain increases < 3 increments on 0-10 scale with standing or gait >10 minutes.  (Target Date: 06/25/2015)   Time 2   Period Months   Status New   PT LONG TERM GOAL #3   Title Patient verbalizes / demonstrates understanding of ongoing fitness plan / HEP.  (Target Date: 06/25/2015)   Time 2   Period Months   Status New    PT LONG TERM GOAL #4   Title patient ambulates 1000' with prosthesis & LRAD modified independent to enable community mobility.  (Target Date: 06/25/2015)   Time 2   Period Months   Status New   PT LONG TERM GOAL #5   Title Berg Balance >40/56 to reduce fall risk.  (Target Date: 06/25/2015)   Time 2   Period Months   Status New               Plan - 04/26/15 0845    Clinical Impression Statement This 64yo male with right Transfemoral Amputation is familar to this PT. He was functioning at full community level with variable cadence (K3) with microprocessor prosthesis prior to October 2016. He developed ischemic pain in residual limb with testing indicating occulsion but he is not a candidate for surgery. He was unable to wear or use his prosthesis for over 8 weeks due to this pain. He was referred to PT to increase gait & mobility with management of his pain. He has tightness and weakness in right hip. He has tenderness with palpation or prosthesis pressuere . He is limted in standing tolerance to 4 minutes with pain increasing to 8-9/10. He would benefit PT to manage pain  and increase activity tolerance to prior level.    Pt will benefit from skilled therapeutic intervention in order to improve on the following deficits Abnormal gait;Decreased activity tolerance;Decreased balance;Decreased endurance;Decreased mobility;Decreased range of motion;Decreased strength;Postural dysfunction;Prosthetic Dependency;Pain   Rehab Potential Good   PT Frequency 2x / week   PT Duration Other (comment)  9 weeks (60 days)   PT Treatment/Interventions ADLs/Self Care Home Management;Electrical Stimulation;Moist Heat;Ultrasound;DME Instruction;Gait training;Stair training;Functional mobility training;Therapeutic activities;Therapeutic exercise;Balance training;Neuromuscular re-education;Patient/family education;Prosthetic Training;Compression bandaging;Manual techniques;Dry needling   PT Next Visit Plan 6-minute  walk test, teach PAD rating scale and initiate walking program & HEP for right hip stretch & strength   Consulted and Agree with Plan of Care Patient          G-Codes - 04-27-2015 0845    Functional Assessment Tool Used right limb pain 9/10 with 4 minutes of standing. Gait with partial weight on prosthesis 100'. Right limb pain is limiting standing & gait along with prosthesis wear.    Functional Limitation Other PT primary   Other PT Primary Current Status [phone]) At least 80 percent but less than 100 percent impaired, limited or restricted   Other PT Primary Goal Status [id number]) At least 20 percent but less than 40 percent impaired, limited or restricted       Problem List Patient Active Problem List   Diagnosis Date Noted  . Lactic acidosis 03/10/2015  . Pain in limb-Right Leg 10/13/2013  . Syphilis 10/09/2013  . Abnormality of gait 08/14/2012  . Atherosclerosis of native artery of extremity with intermittent claudication ([place]) 04/08/2012  . Occlusion and stenosis of carotid artery without mention of cerebral infarction 12/11/2011  . Phantom limb syndrome (right lower limb) 11/23/2011  . Health care maintenance 11/23/2011  . Hepatitis B core antibody positive 11/23/2011  . AIDS with cachexia ([place]) 11/02/2010  . MRSA colonization 09/21/2010  . Furuncle 09/21/2010  . Status post above knee amputation ([place]) 08/02/2009  . DM type 2 with diabetic peripheral neuropathy ([place]) 04/05/2009  . DYSPEPSIA 02/24/2008  . Essential (primary) hypertension 02/13/2008  . Hyperlipidemia associated with type 2 diabetes mellitus ([place]) 07/29/2007  . Human immunodeficiency virus (HIV) disease ([place]) 02/12/2006  . COLONIC POLYPS, ADENOMATOUS 02/12/2006  . Multiple sclerosis ([place]) 02/12/2006  . PERIPHERAL VASCULAR DISEASE 02/12/2006  . GERD 02/12/2006  . HEPATITIS B, HX OF (Pt is unaware of dx but states it remains in his chart no prev or current tx as of 11/2011) 02/12/2006    Myley Bahner PT,  DPT 04/27/2015, 11:29 AM  [facility] [address] [place], [place], [address] Phone: [phone]   Fax:  [phone]  Name: ESTEVAN SHEELER MRN: [id number] Date of Birth: 25-Aug-1950

## 2015-04-28 ENCOUNTER — Encounter: Payer: Self-pay | Admitting: Physical Therapy

## 2015-04-28 ENCOUNTER — Other Ambulatory Visit: Payer: Self-pay | Admitting: *Deleted

## 2015-04-28 ENCOUNTER — Ambulatory Visit: Payer: Medicare Other | Admitting: Physical Therapy

## 2015-04-28 DIAGNOSIS — R269 Unspecified abnormalities of gait and mobility: Secondary | ICD-10-CM

## 2015-04-28 DIAGNOSIS — M79661 Pain in right lower leg: Secondary | ICD-10-CM

## 2015-04-28 DIAGNOSIS — R6889 Other general symptoms and signs: Secondary | ICD-10-CM

## 2015-04-28 DIAGNOSIS — M79609 Pain in unspecified limb: Secondary | ICD-10-CM

## 2015-04-28 DIAGNOSIS — G8918 Other acute postprocedural pain: Secondary | ICD-10-CM | POA: Diagnosis not present

## 2015-04-28 DIAGNOSIS — R2681 Unsteadiness on feet: Secondary | ICD-10-CM | POA: Diagnosis not present

## 2015-04-28 DIAGNOSIS — Z89611 Acquired absence of right leg above knee: Secondary | ICD-10-CM | POA: Diagnosis not present

## 2015-04-28 DIAGNOSIS — T8789 Other complications of amputation stump: Secondary | ICD-10-CM

## 2015-04-28 MED ORDER — OXYCODONE HCL 10 MG PO TABS: 10.0000 mg | ORAL_TABLET | Freq: Three times a day (TID) | ORAL | Status: DC | PRN

## 2015-04-28 NOTE — Telephone Encounter (Signed)
Patient is aware that his Rx is ready for pick up.

## 2015-04-28 NOTE — Therapy (Signed)
[facility] [address] [place], [place], [address] Phone: [phone]   Fax:  [phone]  Physical Therapy Treatment  Patient Details  Name: Nathan [name] MRN: [id number] Date of Birth: Dec 18, 1950 Referring Provider: Harold Barban, MD  Encounter Date: 04/28/2015      PT End of Session - 04/28/15 1145    Visit Number 2   Number of Visits 18   Date for PT Re-Evaluation 06/25/15   Authorization Type Medicare G-Code and progress report every 10 visits   PT Start Time 1100   PT Stop Time 1148   PT Time Calculation (min) 48 min   Activity Tolerance Patient limited by pain   Behavior During Therapy [facility] for tasks assessed/performed      Past Medical History  Diagnosis Date  . Diabetes mellitus   . HIV infection ([place])     undetectable viral load and CD4 ct 667 as of 11/2011  . Colon polyps     noted previous colonoscopy [facility]  . Hyperlipidemia   . Multiple sclerosis ([place])     in remission as of 11/2011 (diagnosed late 1980s)  . GERD (gastroesophageal reflux disease)   . Hypertension   . MRSA (methicillin resistant Staphylococcus aureus)   . 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[place])     2002  . History of syphilis     noted [facility] records  . Asthma     per 2003 [facility] pulm records pfts   . DDD (degenerative disc disease)     cervical spine  . PVD (peripheral vascular disease) ([place])     Left Stent 07/02/2008  . Depression   . Pneumonia   . UTI (urinary tract infection)   . Clotting disorder ([place])   . Blood dyscrasia     HIV  . Fever     unknwon origin  . Hep C w/o coma, chronic ([place])   . Pain in limb-Right Leg 10/13/2013    Past Surgical History  Procedure Laterality Date  . Other surgical history      right lower ext AKA with prothesis   . Other surgical history      left left with stents (Dr. Seward Speck)  . Other surgical history      2003 colonoscopy 5 mm polyp transverse colon; (2)93mm   polyps in rectum-hyperplastic  . Above knee leg amputation      Right Leg  . Lower extremity angiogram Left 12/26/2011    Procedure: LOWER EXTREMITY ANGIOGRAM;  Surgeon: Serafina Mitchell, MD;  Location: [facility] CATH LAB;  Service: Cardiovascular;  Laterality: Left;    There were no vitals filed for this visit.  Visit Diagnosis:  Status post above knee amputation of right lower extremity (HCC)  Abnormality of gait  Painful amputation stump (HCC)  Decreased functional activity tolerance      Subjective Assessment - 04/28/15 1105    Subjective He wore prosthesis Monday  6hrs running errands, then another 2hrs later in day. Pain bad by end of Monday. On Tuesday, wore prosthesis 4hrs but only one time due to pain. He has been using 2 crutches as PT advised.    Currently in Pain? Yes   Pain Score 4    Pain Location Leg   Pain Orientation Right  distal-lateral coming anterior   Pain Descriptors / Indicators Cramping;Squeezing   Pain Type Acute pain   Pain Onset More than a month ago   Pain Frequency Constant   Aggravating Factors  walking & stranding  on prosthesis   Pain Relieving Factors Sitting down, removing prosthesis     Therapeutic Exercise: See HEP Self-care: PT instructed in positioning with pillows in sidelying Manual Technique: Used foam pin to strip right Iliotibial Band.                            PT Education - 04/28/15 1145    Education provided Yes   Education Details HEP for hip ROM; positioning in right & left sidelying; reviewed recommendation for 2hr on 2hrs off to control pain initially and use of 2 crutches for gait   Person(s) Educated Patient   Methods Explanation;Demonstration;Verbal cues;Handout   Comprehension Verbalized understanding;Returned demonstration;Verbal cues required;Tactile cues required;Need further instruction          PT Short Term Goals - 04/26/15 0845    PT SHORT TERM GOAL #1   Title Patient tolerates wear of  prosthesis daily for total time >50% of awake hours. (Target Date: 05/27/2015)   Time 1   Period Months   Status New   PT SHORT TERM GOAL #2   Title Patient reports right limb pain </= 6/10 with 8 minutes of standing or gait activities.  (Target Date: 05/27/2015)   Time 1   Period Months   Status New   PT SHORT TERM GOAL #3   Title Patient ambulates 400' with LRAD & prosthesis with pain increasing <3 increments on 0-10 scale.  (Target Date: 05/27/2015)   Time 1   Period Months   Status New   PT SHORT TERM GOAL #4   Title Patient demonstrates understanding of initial HEP.  (Target Date: 05/27/2015)   Time 1   Period Months   Status New           PT Long Term Goals - 04/26/15 0845    PT LONG TERM GOAL #1   Title Patient tolerates wear of prosthesis daily for >80% of awake hours with limb pain <2/10 sitting or within 5 minutes of sitting down.  (Target Date: 06/25/2015)   Time 2   Period Months   Status New   PT LONG TERM GOAL #2   Title Patient reports right limb pain increases < 3 increments on 0-10 scale with standing or gait >10 minutes.  (Target Date: 06/25/2015)   Time 2   Period Months   Status New   PT LONG TERM GOAL #3   Title Patient verbalizes / demonstrates understanding of ongoing fitness plan / HEP.  (Target Date: 06/25/2015)   Time 2   Period Months   Status New   PT LONG TERM GOAL #4   Title patient ambulates 1000' with prosthesis & LRAD modified independent to enable community mobility.  (Target Date: 06/25/2015)   Time 2   Period Months   Status New   PT LONG TERM GOAL #5   Title Berg Balance >40/56 to reduce fall risk.  (Target Date: 06/25/2015)   Time 2   Period Months   Status New               Plan - 04/28/15 1150    Clinical Impression Statement Patient appears to understand HEP for right hip ROM. Patient reports use of pillows for positioning LEs in sidelying improved comfort of hip.    Pt will benefit from skilled therapeutic intervention in  order to improve on the following deficits Abnormal gait;Decreased activity tolerance;Decreased balance;Decreased endurance;Decreased mobility;Decreased range of motion;Decreased strength;Postural dysfunction;Prosthetic Dependency;Pain  Rehab Potential Good   PT Frequency 2x / week   PT Duration Other (comment)  9 weeks (60 days)   PT Treatment/Interventions ADLs/Self Care Home Management;Electrical Stimulation;Moist Heat;Ultrasound;DME Instruction;Gait training;Stair training;Functional mobility training;Therapeutic activities;Therapeutic exercise;Balance training;Neuromuscular re-education;Patient/family education;Prosthetic Training;Compression bandaging;Manual techniques;Dry needling   PT Next Visit Plan 6-minute walk test, teach PAD rating scale and initiate walking program & HEP review   Consulted and Agree with Plan of Care Patient        Problem List Patient Active Problem List   Diagnosis Date Noted  . Lactic acidosis 03/10/2015  . Pain in limb-Right Leg 10/13/2013  . Syphilis 10/09/2013  . Abnormality of gait 08/14/2012  . Atherosclerosis of native artery of extremity with intermittent claudication ([place]) 04/08/2012  . Occlusion and stenosis of carotid artery without mention of cerebral infarction 12/11/2011  . Phantom limb syndrome (right lower limb) 11/23/2011  . Health care maintenance 11/23/2011  . Hepatitis B core antibody positive 11/23/2011  . AIDS with cachexia ([place]) 11/02/2010  . MRSA colonization 09/21/2010  . Furuncle 09/21/2010  . Status post above knee amputation ([place]) 08/02/2009  . DM type 2 with diabetic peripheral neuropathy ([place]) 04/05/2009  . DYSPEPSIA 02/24/2008  . Essential (primary) hypertension 02/13/2008  . Hyperlipidemia associated with type 2 diabetes mellitus ([place]) 07/29/2007  . Human immunodeficiency virus (HIV) disease ([place]) 02/12/2006  . COLONIC POLYPS, ADENOMATOUS 02/12/2006  . Multiple sclerosis ([place]) 02/12/2006  . PERIPHERAL VASCULAR  DISEASE 02/12/2006  . GERD 02/12/2006  . HEPATITIS B, HX OF (Pt is unaware of dx but states it remains in his chart no prev or current tx as of 11/2011) 02/12/2006    Machi Whittaker PT, DPT 04/28/2015, 8:30 PM  [facility] [address] [place], [place], [address] Phone: [phone]   Fax:  [phone]  Name: Nathan [name] MRN: [id number] Date of Birth: 03-15-1951

## 2015-04-28 NOTE — Patient Instructions (Signed)
Hip / Knee AROM: Abduction / Adduction    Roll to sound side. Lift residual limb straight up and down while keeping hip straight. Repeat _10-15___ times.   Copyright  VHI. All rights reserved.  Hip / Knee AROM: Flexion / Extension    Roll to sound side. Bring knee to chest while bending knee. Reach limb back as far as possible while straightening knee. Repeat _10-15___ times. Copyright  VHI. All rights reserved.  Hip Adduction (TFA)    With towel roll between thighs, gently squeeze thighs together and down. Hold _5___ seconds. Repeat __10-15__ times.    Copyright  VHI. All rights reserved.  Hip AROM: Flexion / Extension    Roll to sound side. Bring residual limb to chest, then reach limb back as far as possible. Repeat __10-15__ times.  Copyright  VHI. All rights reserved.  HIP / KNEE: Flexion, Knee to Chest - Supine    Copyright  VHI. All rights reserved.  Knee to Chest (Flexion)    Pull knee toward chest. Feel stretch in lower back or buttock area. Breathing deeply, Hold __10__ seconds.  Repeat __5-10__ times.   http://gt2.exer.us/225    Copyright  VHI. All rights reserved.  Hip Flexors (Supine)    Lie with both legs to chest to get rid of arch in back To stretch right hip, push right limb down towards bottom of bed.  Hold __5__ seconds. Repeat __10-15__ times.   Copyright  VHI. All rights reserved.

## 2015-05-04 ENCOUNTER — Ambulatory Visit: Payer: Medicare Other | Admitting: Physical Therapy

## 2015-05-04 ENCOUNTER — Encounter: Payer: Self-pay | Admitting: Physical Therapy

## 2015-05-04 DIAGNOSIS — R6889 Other general symptoms and signs: Secondary | ICD-10-CM | POA: Diagnosis not present

## 2015-05-04 DIAGNOSIS — Z89611 Acquired absence of right leg above knee: Secondary | ICD-10-CM | POA: Diagnosis not present

## 2015-05-04 DIAGNOSIS — G8918 Other acute postprocedural pain: Secondary | ICD-10-CM | POA: Diagnosis not present

## 2015-05-04 DIAGNOSIS — R269 Unspecified abnormalities of gait and mobility: Secondary | ICD-10-CM

## 2015-05-04 DIAGNOSIS — R2689 Other abnormalities of gait and mobility: Secondary | ICD-10-CM

## 2015-05-04 DIAGNOSIS — R2681 Unsteadiness on feet: Secondary | ICD-10-CM

## 2015-05-04 DIAGNOSIS — T8789 Other complications of amputation stump: Secondary | ICD-10-CM

## 2015-05-04 DIAGNOSIS — M79609 Pain in unspecified limb: Secondary | ICD-10-CM

## 2015-05-04 NOTE — Therapy (Signed)
[facility] [address] [place], [place], [address] Phone: [phone]   Fax:  [phone]  Physical Therapy Treatment  Patient Details  Name: Nathan [name] MRN: [id number] Date of Birth: 1950/05/09 Referring Provider: Harold Barban, MD  Encounter Date: 05/04/2015      PT End of Session - 05/04/15 1015    Visit Number 3   Number of Visits 18   Date for PT Re-Evaluation 06/25/15   Authorization Type Medicare G-Code and progress report every 10 visits   PT Start Time 0930   PT Stop Time 1015   PT Time Calculation (min) 45 min   Activity Tolerance Patient limited by pain   Behavior During Therapy [facility] for tasks assessed/performed      Past Medical History  Diagnosis Date  . Diabetes mellitus   . HIV infection ([place])     undetectable viral load and CD4 ct 667 as of 11/2011  . Colon polyps     noted previous colonoscopy [facility]  . Hyperlipidemia   . Multiple sclerosis ([place])     in remission as of 11/2011 (diagnosed late 1980s)  . GERD (gastroesophageal reflux disease)   . Hypertension   . MRSA (methicillin resistant Staphylococcus aureus)   . 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[place])     2002  . History of syphilis     noted [facility] records  . Asthma     per 2003 [facility] pulm records pfts   . DDD (degenerative disc disease)     cervical spine  . PVD (peripheral vascular disease) ([place])     Left Stent 07/02/2008  . Depression   . Pneumonia   . UTI (urinary tract infection)   . Clotting disorder ([place])   . Blood dyscrasia     HIV  . Fever     unknwon origin  . Hep C w/o coma, chronic ([place])   . Pain in limb-Right Leg 10/13/2013    Past Surgical History  Procedure Laterality Date  . Other surgical history      right lower ext AKA with prothesis   . Other surgical history      left left with stents (Dr. Seward Speck)  . Other surgical history      2003 colonoscopy 5 mm polyp transverse colon; (2)37mm   polyps in rectum-hyperplastic  . Above knee leg amputation      Right Leg  . Lower extremity angiogram Left 12/26/2011    Procedure: LOWER EXTREMITY ANGIOGRAM;  Surgeon: Serafina Mitchell, MD;  Location: [facility] CATH LAB;  Service: Cardiovascular;  Laterality: Left;    There were no vitals filed for this visit.  Visit Diagnosis:  Status post above knee amputation of right lower extremity (HCC)  Abnormality of gait  Painful amputation stump (HCC)  Decreased functional activity tolerance  Unsteadiness  Balance problems      Subjective Assessment - 05/04/15 0934    Subjective He wore prosthesis daily but longer than recommended due to holidays. He did his exercises.    Currently in Pain? Yes   Pain Score 5   Since last PT worst 7/10, best 2-3/10   Pain Location Leg   Pain Orientation Right   Pain Descriptors / Indicators Cramping;Squeezing   Pain Type Acute pain   Pain Onset More than a month ago   Pain Frequency Constant   Aggravating Factors  walking & standing   Pain Relieving Factors sitting down, removing prosthesis  [facility] Adult PT Treatment/Exercise - 05/04/15 0930    Ambulation/Gait   Ambulation/Gait Yes   Ambulation/Gait Assistance 5: Supervision   Ambulation/Gait Assistance Details demo & verbal cues on proper step length for speed, step thru pattern, wt shift directly over prosthesis in stance   Ambulation Distance (Feet) 200 Feet   Assistive device Crutches;Prosthesis   Ambulation Surface Indoor;Level   Modalities   Modalities Electrical Stimulation;Ultrasound   Electrical Stimulation   Electrical Stimulation Location right residual sciatic nerve tract   Electrical Stimulation Parameters 500 mamps   Electrical Stimulation Goals Pain   Ultrasound   Ultrasound Location right residual limb distal-posterior & lateral   Ultrasound Parameters 1.5 watts /cm constant   Ultrasound Goals Pain   Manual Therapy   Manual Therapy Soft  tissue mobilization;Muscle Energy Technique   Soft tissue mobilization deep tissue massage to ITB and distal posterior limb   Muscle Energy Technique contract-relax to both anatoginist and agnonist to increase right hip flexion, extension, abduction & adduction    Prosthetics   Current prosthetic wear tolerance (days/week)  daily   Current prosthetic wear tolerance (#hours/day)  wear 6hrs due to holiday & traveling; recommended 2hrs on 2hrs off for pain management   Education Provided Proper wear schedule/adjustment;Proper [place]   Person(s) Educated Patient   Education Method Explanation   Education Method Verbalized understanding;Verbal cues required;Needs further instruction                  PT Short Term Goals - 04/26/15 0845    PT SHORT TERM GOAL #1   Title Patient tolerates wear of prosthesis daily for total time >50% of awake hours. (Target Date: 05/27/2015)   Time 1   Period Months   Status New   PT SHORT TERM GOAL #2   Title Patient reports right limb pain </= 6/10 with 8 minutes of standing or gait activities.  (Target Date: 05/27/2015)   Time 1   Period Months   Status New   PT SHORT TERM GOAL #3   Title Patient ambulates 400' with LRAD & prosthesis with pain increasing <3 increments on 0-10 scale.  (Target Date: 05/27/2015)   Time 1   Period Months   Status New   PT SHORT TERM GOAL #4   Title Patient demonstrates understanding of initial HEP.  (Target Date: 05/27/2015)   Time 1   Period Months   Status New           PT Long Term Goals - 04/26/15 0845    PT LONG TERM GOAL #1   Title Patient tolerates wear of prosthesis daily for >80% of awake hours with limb pain <2/10 sitting or within 5 minutes of sitting down.  (Target Date: 06/25/2015)   Time 2   Period Months   Status New   PT LONG TERM GOAL #2   Title Patient reports right limb pain increases < 3 increments on 0-10 scale with standing or gait >10 minutes.  (Target Date: 06/25/2015)   Time 2    Period Months   Status New   PT LONG TERM GOAL #3   Title Patient verbalizes / demonstrates understanding of ongoing fitness plan / HEP.  (Target Date: 06/25/2015)   Time 2   Period Months   Status New   PT LONG TERM GOAL #4   Title patient ambulates 1000' with prosthesis & LRAD modified independent to enable community mobility.  (Target Date: 06/25/2015)   Time 2   Period Months   Status  New   PT LONG TERM GOAL #5   Title Berg Balance >40/56 to reduce fall risk.  (Target Date: 06/25/2015)   Time 2   Period Months   Status New               Plan - 05/04/15 1015    Clinical Impression Statement Patient reported less limb pain with gait with 2 crutches & proper wt shift directly over prosthesis in stance. He reports he will be able to modify wear to PTs recommended 2hr on 2hrs off for pain management now that holidays are over.    Pt will benefit from skilled therapeutic intervention in order to improve on the following deficits Abnormal gait;Decreased activity tolerance;Decreased balance;Decreased endurance;Decreased mobility;Decreased range of motion;Decreased strength;Postural dysfunction;Prosthetic Dependency;Pain   Rehab Potential Good   PT Frequency 2x / week   PT Duration Other (comment)  9 weeks (60 days)   PT Treatment/Interventions ADLs/Self Care Home Management;Electrical Stimulation;Moist Heat;Ultrasound;DME Instruction;Gait training;Stair training;Functional mobility training;Therapeutic activities;Therapeutic exercise;Balance training;Neuromuscular re-education;Patient/family education;Prosthetic Training;Compression bandaging;Manual techniques;Dry needling   PT Next Visit Plan 6-minute walk test, teach PAD rating scale and initiate walking program & HEP review, US/e-stim & manual technique to right residual limb   Consulted and Agree with Plan of Care Patient        Problem List Patient Active Problem List   Diagnosis Date Noted  . Lactic acidosis 03/10/2015  .  Pain in limb-Right Leg 10/13/2013  . Syphilis 10/09/2013  . Abnormality of gait 08/14/2012  . Atherosclerosis of native artery of extremity with intermittent claudication ([place]) 04/08/2012  . Occlusion and stenosis of carotid artery without mention of cerebral infarction 12/11/2011  . Phantom limb syndrome (right lower limb) 11/23/2011  . Health care maintenance 11/23/2011  . Hepatitis B core antibody positive 11/23/2011  . AIDS with cachexia ([place]) 11/02/2010  . MRSA colonization 09/21/2010  . Furuncle 09/21/2010  . Status post above knee amputation ([place]) 08/02/2009  . DM type 2 with diabetic peripheral neuropathy ([place]) 04/05/2009  . DYSPEPSIA 02/24/2008  . Essential (primary) hypertension 02/13/2008  . Hyperlipidemia associated with type 2 diabetes mellitus ([place]) 07/29/2007  . Human immunodeficiency virus (HIV) disease ([place]) 02/12/2006  . COLONIC POLYPS, ADENOMATOUS 02/12/2006  . Multiple sclerosis ([place]) 02/12/2006  . PERIPHERAL VASCULAR DISEASE 02/12/2006  . GERD 02/12/2006  . HEPATITIS B, HX OF (Pt is unaware of dx but states it remains in his chart no prev or current tx as of 11/2011) 02/12/2006    Shantanu Strauch PT, DPT 05/04/2015, 4:15 PM  [facility] [address] [place], [place], [address] Phone: [phone]   Fax:  [phone]  Name: DEANDRA ABEGGLEN MRN: [id number] Date of Birth: 07-Aug-1950

## 2015-05-06 ENCOUNTER — Encounter: Payer: Self-pay | Admitting: Physical Therapy

## 2015-05-06 ENCOUNTER — Ambulatory Visit: Payer: Medicare Other | Admitting: Physical Therapy

## 2015-05-06 DIAGNOSIS — R6889 Other general symptoms and signs: Secondary | ICD-10-CM

## 2015-05-06 DIAGNOSIS — G8918 Other acute postprocedural pain: Secondary | ICD-10-CM | POA: Diagnosis not present

## 2015-05-06 DIAGNOSIS — R2681 Unsteadiness on feet: Secondary | ICD-10-CM | POA: Diagnosis not present

## 2015-05-06 DIAGNOSIS — Z89611 Acquired absence of right leg above knee: Secondary | ICD-10-CM

## 2015-05-06 DIAGNOSIS — T8789 Other complications of amputation stump: Secondary | ICD-10-CM

## 2015-05-06 DIAGNOSIS — R269 Unspecified abnormalities of gait and mobility: Secondary | ICD-10-CM

## 2015-05-06 DIAGNOSIS — M79609 Pain in unspecified limb: Secondary | ICD-10-CM

## 2015-05-06 NOTE — Therapy (Signed)
[facility] [address] [place], [place], [address] Phone: [phone]   Fax:  [phone]  Physical Therapy Treatment  Patient Details  Name: Nathan [name] MRN: [id number] Date of Birth: 1950/11/21 Referring Provider: Harold Barban, MD  Encounter Date: 05/06/2015      PT End of Session - 05/06/15 1015    Visit Number 4   Number of Visits 18   Date for PT Re-Evaluation 06/25/15   Authorization Type Medicare G-Code and progress report every 10 visits   PT Start Time 0938   PT Stop Time 1018   PT Time Calculation (min) 40 min   Activity Tolerance Patient limited by pain   Behavior During Therapy [facility] for tasks assessed/performed      Past Medical History  Diagnosis Date  . Diabetes mellitus   . HIV infection ([place])     undetectable viral load and CD4 ct 667 as of 11/2011  . Colon polyps     noted previous colonoscopy [facility]  . Hyperlipidemia   . Multiple sclerosis ([place])     in remission as of 11/2011 (diagnosed late 1980s)  . GERD (gastroesophageal reflux disease)   . Hypertension   . MRSA (methicillin resistant Staphylococcus aureus)   . 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[place])     2002  . History of syphilis     noted [facility] records  . Asthma     per 2003 [facility] pulm records pfts   . DDD (degenerative disc disease)     cervical spine  . PVD (peripheral vascular disease) ([place])     Left Stent 07/02/2008  . Depression   . Pneumonia   . UTI (urinary tract infection)   . Clotting disorder ([place])   . Blood dyscrasia     HIV  . Fever     unknwon origin  . Hep C w/o coma, chronic ([place])   . Pain in limb-Right Leg 10/13/2013    Past Surgical History  Procedure Laterality Date  . Other surgical history      right lower ext AKA with prothesis   . Other surgical history      left left with stents (Dr. Seward Speck)  . Other surgical history      2003 colonoscopy 5 mm polyp transverse colon; (2)56mm   polyps in rectum-hyperplastic  . Above knee leg amputation      Right Leg  . Lower extremity angiogram Left 12/26/2011    Procedure: LOWER EXTREMITY ANGIOGRAM;  Surgeon: Serafina Mitchell, MD;  Location: [facility] CATH LAB;  Service: Cardiovascular;  Laterality: Left;    There were no vitals filed for this visit.  Visit Diagnosis:  Status post above knee amputation of right lower extremity (HCC)  Painful amputation stump (HCC)  Decreased functional activity tolerance  Abnormality of gait      Subjective Assessment - 05/06/15 0938    Subjective wore prosthesis as advised yesterday. Pain got up to 6-7/10. Then last night 3/10   Currently in Pain? Yes   Pain Score 4    Pain Location Leg   Pain Orientation Right   Pain Descriptors / Indicators Cramping;Squeezing   Pain Type Chronic pain   Pain Onset More than a month ago   Pain Frequency Constant   Aggravating Factors  walking & standing   Pain Relieving Factors sitting down, removing prosthesis  [facility] Adult PT Treatment/Exercise - 05/06/15 0930    Ambulation/Gait   Ambulation/Gait Yes   Ambulation/Gait Assistance 5: Supervision   Ambulation/Gait Assistance Details cues on stance prosthetic knee control   Ambulation Distance (Feet) 200 Feet   Assistive device Crutches;Prosthesis   Modalities   Modalities Electrical Stimulation;Ultrasound   Electrical Stimulation   Electrical Stimulation Location right residual  limb along ITB towards Siatic Nerve   Electrical Stimulation Parameters 50 volts   Electrical Stimulation Goals Pain   Ultrasound   Ultrasound Location right residual limb along ITB and distal femur (painful area)   Ultrasound Parameters 1.3watts/cm for 8 min combined with E-stim   Ultrasound Goals Pain   Manual Therapy   Manual Therapy Soft tissue mobilization;Muscle Energy Technique   Soft tissue mobilization deep tissue massage to ITB and distal posterior limb   Muscle Energy  Technique contract-relax to both anatoginist and agnonist to increase right hip flexion, extension, abduction & adduction    Prosthetics   Prosthetic Care Comments  PT used intranet to show patient new options for socket designs    Current prosthetic wear tolerance (days/week)  daily   Current prosthetic wear tolerance (#hours/day)  wore prosthesis 2 hrs on 2hrs off yesterday with increased pain during day but decreased over night.    Education Provided Proper wear schedule/adjustment;Proper Donning   Person(s) Educated Patient   Education Method Explanation;Demonstration   Education Method Verbalized understanding                PT Education - 05/06/15 1015    Education provided Yes   Education Details Recommendation for new socket with different design.   Person(s) Educated Patient   Methods Explanation;Demonstration   Comprehension Verbalized understanding          PT Short Term Goals - 04/26/15 0845    PT SHORT TERM GOAL #1   Title Patient tolerates wear of prosthesis daily for total time >50% of awake hours. (Target Date: 05/27/2015)   Time 1   Period Months   Status New   PT SHORT TERM GOAL #2   Title Patient reports right limb pain </= 6/10 with 8 minutes of standing or gait activities.  (Target Date: 05/27/2015)   Time 1   Period Months   Status New   PT SHORT TERM GOAL #3   Title Patient ambulates 400' with LRAD & prosthesis with pain increasing <3 increments on 0-10 scale.  (Target Date: 05/27/2015)   Time 1   Period Months   Status New   PT SHORT TERM GOAL #4   Title Patient demonstrates understanding of initial HEP.  (Target Date: 05/27/2015)   Time 1   Period Months   Status New           PT Long Term Goals - 04/26/15 0845    PT LONG TERM GOAL #1   Title Patient tolerates wear of prosthesis daily for >80% of awake hours with limb pain <2/10 sitting or within 5 minutes of sitting down.  (Target Date: 06/25/2015)   Time 2   Period Months   Status  New   PT LONG TERM GOAL #2   Title Patient reports right limb pain increases < 3 increments on 0-10 scale with standing or gait >10 minutes.  (Target Date: 06/25/2015)   Time 2   Period Months   Status New   PT LONG TERM GOAL #3   Title Patient verbalizes / demonstrates understanding of ongoing fitness plan / HEP.  (Target  Date: 06/25/2015)   Time 2   Period Months   Status New   PT LONG TERM GOAL #4   Title patient ambulates 1000' with prosthesis & LRAD modified independent to enable community mobility.  (Target Date: 06/25/2015)   Time 2   Period Months   Status New   PT LONG TERM GOAL #5   Title Berg Balance >40/56 to reduce fall risk.  (Target Date: 06/25/2015)   Time 2   Period Months   Status New               Plan - 05/06/15 1020    Clinical Impression Statement This patient's residual limb has changed volumeric size with shrinkage & atrophy of muscles. With minimal soft tissue on his residual limb his current socket causes severe pain limiting wear and mobility. He would benefit from a new socket including design of LIM or Integrated system with softer material to interface and less contact with support structures to relief painful pressure areas. Patient plans to set up appointment with prosthetist to discuss this issue.    Pt will benefit from skilled therapeutic intervention in order to improve on the following deficits Abnormal gait;Decreased activity tolerance;Decreased balance;Decreased endurance;Decreased mobility;Decreased range of motion;Decreased strength;Postural dysfunction;Prosthetic Dependency;Pain   Rehab Potential Good   PT Frequency 2x / week   PT Duration Other (comment)  9 weeks (60 days)   PT Treatment/Interventions ADLs/Self Care Home Management;Electrical Stimulation;Moist Heat;Ultrasound;DME Instruction;Gait training;Stair training;Functional mobility training;Therapeutic activities;Therapeutic exercise;Balance training;Neuromuscular  re-education;Patient/family education;Prosthetic Training;Compression bandaging;Manual techniques;Dry needling   PT Next Visit Plan US/e-stim & manual technique to right residual limb, prosthetic gait   Consulted and Agree with Plan of Care Patient        Problem List Patient Active Problem List   Diagnosis Date Noted  . Lactic acidosis 03/10/2015  . Pain in limb-Right Leg 10/13/2013  . Syphilis 10/09/2013  . Abnormality of gait 08/14/2012  . Atherosclerosis of native artery of extremity with intermittent claudication ([place]) 04/08/2012  . Occlusion and stenosis of carotid artery without mention of cerebral infarction 12/11/2011  . Phantom limb syndrome (right lower limb) 11/23/2011  . Health care maintenance 11/23/2011  . Hepatitis B core antibody positive 11/23/2011  . AIDS with cachexia ([place]) 11/02/2010  . MRSA colonization 09/21/2010  . Furuncle 09/21/2010  . Status post above knee amputation ([place]) 08/02/2009  . DM type 2 with diabetic peripheral neuropathy ([place]) 04/05/2009  . DYSPEPSIA 02/24/2008  . Essential (primary) hypertension 02/13/2008  . Hyperlipidemia associated with type 2 diabetes mellitus ([place]) 07/29/2007  . Human immunodeficiency virus (HIV) disease ([place]) 02/12/2006  . COLONIC POLYPS, ADENOMATOUS 02/12/2006  . Multiple sclerosis ([place]) 02/12/2006  . PERIPHERAL VASCULAR DISEASE 02/12/2006  . GERD 02/12/2006  . HEPATITIS B, HX OF (Pt is unaware of dx but states it remains in his chart no prev or current tx as of 11/2011) 02/12/2006    Jolana Runkles PT, DPT 05/06/2015, 12:37 PM  [facility] [address] [place], [place], [address] Phone: [phone]   Fax:  [phone]  Name: Nathan [name] MRN: [id number] Date of Birth: 08/21/1950

## 2015-05-11 ENCOUNTER — Encounter: Payer: Self-pay | Admitting: Physical Therapy

## 2015-05-11 ENCOUNTER — Ambulatory Visit: Payer: Medicare Other | Attending: Surgery | Admitting: Physical Therapy

## 2015-05-11 DIAGNOSIS — M79609 Pain in unspecified limb: Secondary | ICD-10-CM

## 2015-05-11 DIAGNOSIS — R2681 Unsteadiness on feet: Secondary | ICD-10-CM | POA: Insufficient documentation

## 2015-05-11 DIAGNOSIS — R269 Unspecified abnormalities of gait and mobility: Secondary | ICD-10-CM

## 2015-05-11 DIAGNOSIS — R29818 Other symptoms and signs involving the nervous system: Secondary | ICD-10-CM | POA: Diagnosis not present

## 2015-05-11 DIAGNOSIS — R6889 Other general symptoms and signs: Secondary | ICD-10-CM | POA: Diagnosis not present

## 2015-05-11 DIAGNOSIS — Z89611 Acquired absence of right leg above knee: Secondary | ICD-10-CM | POA: Insufficient documentation

## 2015-05-11 DIAGNOSIS — T8789 Other complications of amputation stump: Secondary | ICD-10-CM | POA: Diagnosis not present

## 2015-05-11 DIAGNOSIS — G8918 Other acute postprocedural pain: Secondary | ICD-10-CM | POA: Diagnosis not present

## 2015-05-11 DIAGNOSIS — R2689 Other abnormalities of gait and mobility: Secondary | ICD-10-CM

## 2015-05-11 DIAGNOSIS — W19XXXS Unspecified fall, sequela: Secondary | ICD-10-CM | POA: Insufficient documentation

## 2015-05-11 NOTE — Therapy (Signed)
[facility] [address] [place], [place], [address] Phone: [phone]   Fax:  [phone]  Physical Therapy Treatment  Patient Details  Name: Nathan [name] MRN: [id number] Date of Birth: [birth year] Referring Provider: Harold Barban, MD  Encounter Date: 05/11/2015      PT End of Session - 05/11/15 0852    Visit Number 5   Number of Visits 18   Date for PT Re-Evaluation 06/25/15   Authorization Type Medicare G-Code and progress report every 10 visits   PT Start Time 0847   PT Stop Time 0927   PT Time Calculation (min) 40 min   Activity Tolerance Patient limited by pain   Behavior During Therapy [facility] for tasks assessed/performed      Past Medical History  Diagnosis Date  . Diabetes mellitus   . HIV infection ([place])     undetectable viral load and CD4 ct 667 as of 11/2011  . Colon polyps     noted previous colonoscopy [facility]  . Hyperlipidemia   . Multiple sclerosis ([place])     in remission as of 11/2011 (diagnosed late 1980s)  . GERD (gastroesophageal reflux disease)   . Hypertension   . MRSA (methicillin resistant Staphylococcus aureus)   . 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[place])     2002  . History of syphilis     noted [facility] records  . Asthma     per 2003 [facility] pulm records pfts   . DDD (degenerative disc disease)     cervical spine  . PVD (peripheral vascular disease) ([place])     Left Stent 07/02/2008  . Depression   . Pneumonia   . UTI (urinary tract infection)   . Clotting disorder ([place])   . Blood dyscrasia     HIV  . Fever     unknwon origin  . Hep C w/o coma, chronic ([place])   . Pain in limb-Right Leg 10/13/2013    Past Surgical History  Procedure Laterality Date  . Other surgical history      right lower ext AKA with prothesis   . Other surgical history      left left with stents (Dr. Seward Speck)  . Other surgical history      2003 colonoscopy 5 mm polyp transverse colon; (2)56mm   polyps in rectum-hyperplastic  . Above knee leg amputation      Right Leg  . Lower extremity angiogram Left 12/26/2011    Procedure: LOWER EXTREMITY ANGIOGRAM;  Surgeon: Serafina Mitchell, MD;  Location: [facility] CATH LAB;  Service: Cardiovascular;  Laterality: Left;    There were no vitals filed for this visit.  Visit Diagnosis:  Decreased functional activity tolerance  Painful amputation stump (HCC)  Abnormality of gait  Unsteadiness  Balance problems      Subjective Assessment - 05/11/15 0849    Subjective No new complaints.  Wearling prosthesis 3-4 hours 2x day.  Pain increases to 6-7/10 with gait and eases off with rest/sitting down.   Currently in Pain? Yes   Pain Score 3    Pain Location Leg   Pain Orientation Right   Pain Descriptors / Indicators Cramping;Squeezing   Pain Type Chronic pain   Pain Onset More than a month ago   Pain Frequency Constant   Aggravating Factors  walking and standing   Pain Relieving Factors sitting down, removing prosthesis           [facility] Adult PT Treatment/Exercise - 05/11/15 [id number]  Ambulation/Gait   Ambulation/Gait Yes   Ambulation/Gait Assistance 5: Supervision   Ambulation/Gait Assistance Details cues for increased step length for step through pattern and for increaesed weight shift over the prosthesis   Ambulation Distance (Feet) 400 Feet   Assistive device Crutches;Prosthesis   Gait Pattern Step-through pattern;Decreased step length - left;Decreased stance time - right;Decreased stride length;Decreased hip/knee flexion - right;Decreased weight shift to right;Right circumduction;Right hip hike;Antalgic;Trunk flexed;Abducted- right;Poor foot clearance - right   Ambulation Surface Level;Indoor   Gait Comments pt advised to try single crutch/cane for short, household distances only and continue to use both crutches for longer distances. Pt verbalized understanding.   Acupuncturist Location right residual   limb along ITB towards Siatic Nerve   Electrical Stimulation Parameters 50 volts   Electrical Stimulation Goals Pain   Ultrasound   Ultrasound Location right residual limb along IT band at distal end of femur (painful area)   Ultrasound Parameters 1.5 w/cm2 x 8 minutes combined with e-stim   Ultrasound Goals Pain   Manual Therapy   Manual Therapy Soft tissue mobilization;Myofascial release;Muscle Energy Technique  strain counter strain stretching   Manual therapy comments myofascial release and strain counter strain for increased tissue extensibility, decreased pain   Soft tissue mobilization deep tissue massage to ITB and distal posterior limb   Muscle Energy Technique contract-relax to both anatoginist and agnonist to increase right hip flexion, extension, abduction & adduction    Prosthetics   Current prosthetic wear tolerance (days/week)  daily   Current prosthetic wear tolerance (#hours/day)  3-4 hours 2 x day   Residual limb condition  no open areas   Education Provided Proper wear schedule/adjustment;Proper weight-bearing schedule/adjustment   Person(s) Educated Patient   Education Method Explanation;Demonstration;Verbal cues   Education Method Verbalized understanding           PT Short Term Goals - 04/26/15 0845    PT SHORT TERM GOAL #1   Title Patient tolerates wear of prosthesis daily for total time >50% of awake hours. (Target Date: 05/27/2015)   Time 1   Period Months   Status New   PT SHORT TERM GOAL #2   Title Patient reports right limb pain </= 6/10 with 8 minutes of standing or gait activities.  (Target Date: 05/27/2015)   Time 1   Period Months   Status New   PT SHORT TERM GOAL #3   Title Patient ambulates 400' with LRAD & prosthesis with pain increasing <3 increments on 0-10 scale.  (Target Date: 05/27/2015)   Time 1   Period Months   Status New   PT SHORT TERM GOAL #4   Title Patient demonstrates understanding of initial HEP.  (Target Date: 05/27/2015)    Time 1   Period Months   Status New           PT Long Term Goals - 04/26/15 0845    PT LONG TERM GOAL #1   Title Patient tolerates wear of prosthesis daily for >80% of awake hours with limb pain <2/10 sitting or within 5 minutes of sitting down.  (Target Date: 06/25/2015)   Time 2   Period Months   Status New   PT LONG TERM GOAL #2   Title Patient reports right limb pain increases < 3 increments on 0-10 scale with standing or gait >10 minutes.  (Target Date: 06/25/2015)   Time 2   Period Months   Status New   PT LONG TERM GOAL #3  Title Patient verbalizes / demonstrates understanding of ongoing fitness plan / HEP.  (Target Date: 06/25/2015)   Time 2   Period Months   Status New   PT LONG TERM GOAL #4   Title patient ambulates 1000' with prosthesis & LRAD modified independent to enable community mobility.  (Target Date: 06/25/2015)   Time 2   Period Months   Status New   PT LONG TERM GOAL #5   Title Berg Balance >40/56 to reduce fall risk.  (Target Date: 06/25/2015)   Time 2   Period Months   Status New           Plan - 05/11/15 [id number]    Clinical Impression Statement Pt is making steady progress toward goals with less pain being reported with increased activity. Pt is to call his prosthetist today to set up appointment to discuss socket revision.   Pt will benefit from skilled therapeutic intervention in order to improve on the following deficits Abnormal gait;Decreased activity tolerance;Decreased balance;Decreased endurance;Decreased mobility;Decreased range of motion;Decreased strength;Postural dysfunction;Prosthetic Dependency;Pain   Rehab Potential Good   PT Frequency 2x / week   PT Duration Other (comment)  9 weeks (60 days)   PT Treatment/Interventions ADLs/Self Care Home Management;Electrical Stimulation;Moist Heat;Ultrasound;DME Instruction;Gait training;Stair training;Functional mobility training;Therapeutic activities;Therapeutic exercise;Balance  training;Neuromuscular re-education;Patient/family education;Prosthetic Training;Compression bandaging;Manual techniques;Dry needling   PT Next Visit Plan US/e-stim & manual technique to right residual limb, prosthetic gait   Consulted and Agree with Plan of Care Patient        Problem List Patient Active Problem List   Diagnosis Date Noted  . Lactic acidosis 03/10/2015  . Pain in limb-Right Leg 10/13/2013  . Syphilis 10/09/2013  . Abnormality of gait 08/14/2012  . Atherosclerosis of native artery of extremity with intermittent claudication ([place]) 04/08/2012  . Occlusion and stenosis of carotid artery without mention of cerebral infarction 12/11/2011  . Phantom limb syndrome (right lower limb) 11/23/2011  . Health care maintenance 11/23/2011  . Hepatitis B core antibody positive 11/23/2011  . AIDS with cachexia ([place]) 11/02/2010  . MRSA colonization 09/21/2010  . Furuncle 09/21/2010  . Status post above knee amputation ([place]) 08/02/2009  . DM type 2 with diabetic peripheral neuropathy ([place]) 04/05/2009  . DYSPEPSIA 02/24/2008  . Essential (primary) hypertension 02/13/2008  . Hyperlipidemia associated with type 2 diabetes mellitus ([place]) 07/29/2007  . Human immunodeficiency virus (HIV) disease ([place]) 02/12/2006  . COLONIC POLYPS, ADENOMATOUS 02/12/2006  . Multiple sclerosis ([place]) 02/12/2006  . PERIPHERAL VASCULAR DISEASE 02/12/2006  . GERD 02/12/2006  . HEPATITIS B, HX OF (Pt is unaware of dx but states it remains in his chart no prev or current tx as of 11/2011) 02/12/2006    Willow Ora 05/11/2015, 10:50 AM   Willow Ora, PTA, [facility] [address], [place], [place] [address] [phone] 05/11/2015, 10:50 AM   Name: Nathan [name] MRN: [id number] Date of Birth: [birth year]

## 2015-05-13 ENCOUNTER — Ambulatory Visit: Payer: Medicare Other | Admitting: Physical Therapy

## 2015-05-13 ENCOUNTER — Encounter: Payer: Self-pay | Admitting: Physical Therapy

## 2015-05-13 DIAGNOSIS — R6889 Other general symptoms and signs: Secondary | ICD-10-CM

## 2015-05-13 DIAGNOSIS — R29818 Other symptoms and signs involving the nervous system: Secondary | ICD-10-CM | POA: Diagnosis not present

## 2015-05-13 DIAGNOSIS — R269 Unspecified abnormalities of gait and mobility: Secondary | ICD-10-CM | POA: Diagnosis not present

## 2015-05-13 DIAGNOSIS — R2681 Unsteadiness on feet: Secondary | ICD-10-CM | POA: Diagnosis not present

## 2015-05-13 DIAGNOSIS — R2689 Other abnormalities of gait and mobility: Secondary | ICD-10-CM

## 2015-05-13 DIAGNOSIS — T8789 Other complications of amputation stump: Secondary | ICD-10-CM

## 2015-05-13 DIAGNOSIS — G8918 Other acute postprocedural pain: Secondary | ICD-10-CM | POA: Diagnosis not present

## 2015-05-13 DIAGNOSIS — Z89611 Acquired absence of right leg above knee: Secondary | ICD-10-CM

## 2015-05-13 DIAGNOSIS — M79609 Pain in unspecified limb: Secondary | ICD-10-CM

## 2015-05-13 DIAGNOSIS — W19XXXS Unspecified fall, sequela: Secondary | ICD-10-CM

## 2015-05-13 NOTE — Therapy (Signed)
[facility] [address] [place], [place], [address] Phone: [phone]   Fax:  [phone]  Physical Therapy Treatment  Patient Details  Name: Nathan [name] MRN: [id number] Date of Birth: [birth year] Referring Provider: Harold Barban, MD  Encounter Date: 05/13/2015      PT End of Session - 05/13/15 0856    Visit Number 6   Number of Visits 18   Date for PT Re-Evaluation 06/25/15   Authorization Type Medicare G-Code and progress report every 10 visits   PT Start Time 0846   PT Stop Time 0926   PT Time Calculation (min) 40 min   Activity Tolerance Patient limited by pain   Behavior During Therapy [facility] for tasks assessed/performed      Past Medical History  Diagnosis Date  . Diabetes mellitus   . HIV infection ([place])     undetectable viral load and CD4 ct 667 as of 11/2011  . Colon polyps     noted previous colonoscopy [facility]  . Hyperlipidemia   . Multiple sclerosis ([place])     in remission as of 11/2011 (diagnosed late 1980s)  . GERD (gastroesophageal reflux disease)   . Hypertension   . MRSA (methicillin resistant Staphylococcus aureus)   . 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[place])     2002  . History of syphilis     noted [facility] records  . Asthma     per 2003 [facility] pulm records pfts   . DDD (degenerative disc disease)     cervical spine  . PVD (peripheral vascular disease) ([place])     Left Stent 07/02/2008  . Depression   . Pneumonia   . UTI (urinary tract infection)   . Clotting disorder ([place])   . Blood dyscrasia     HIV  . Fever     unknwon origin  . Hep C w/o coma, chronic ([place])   . Pain in limb-Right Leg 10/13/2013    Past Surgical History  Procedure Laterality Date  . Other surgical history      right lower ext AKA with prothesis   . Other surgical history      left left with stents (Dr. Seward Speck)  . Other surgical history      2003 colonoscopy 5 mm polyp transverse colon; (2)30mm   polyps in rectum-hyperplastic  . Above knee leg amputation      Right Leg  . Lower extremity angiogram Left 12/26/2011    Procedure: LOWER EXTREMITY ANGIOGRAM;  Surgeon: Serafina Mitchell, MD;  Location[facility] CATH LAB;  Service: Cardiovascular;  Laterality: Left;    There were no vitals filed for this visit.  Visit Diagnosis:  Decreased functional activity tolerance  Painful amputation stump (HCC)  Abnormality of gait  Unsteadiness  Balance problems  Status post above knee amputation of right lower extremity (HCC)  Falls, sequela      Subjective Assessment - 05/13/15 0854    Subjective No new compliants. Wearing prosthesis 3-4 hours, 2 x day. Increased pain yesterday evening, 6/10 with weight bearing.   Currently in Pain? Yes   Pain Score 4    Pain Location Leg   Pain Orientation Right   Pain Descriptors / Indicators Squeezing;Cramping   Pain Type Chronic pain   Pain Onset More than a month ago   Pain Frequency Constant   Aggravating Factors  walking or standing    Pain Relieving Factors sitting down, removing prosthesis  [facility] Adult PT Treatment/Exercise - 05/13/15 0856    Ambulation/Gait   Ambulation/Gait Yes   Ambulation/Gait Assistance 5: Supervision   Ambulation/Gait Assistance Details reported minimal increase in pain after gait with single crutch.   Ambulation Distance (Feet) 400 Feet   Assistive device Crutches;Prosthesis  single axillary crutch   Gait Pattern Step-through pattern;Decreased step length - left;Decreased stance time - right;Decreased stride length;Decreased hip/knee flexion - right;Decreased weight shift to right;Right circumduction;Right hip hike;Antalgic;Abducted- right   Ambulation Surface Level;Indoor   Acupuncturist Location right residual  limb along ITB towards Siatic Nerve concurrent with ultrasound   Electrical Stimulation Parameters 60   Electrical Stimulation Goals Pain   Ultrasound    Ultrasound Location right residual limb distal to posterior and lateral end (below estim along IT band).   Ultrasound Parameters 1.5   Ultrasound Goals Pain   Manual Therapy   Manual Therapy Soft tissue mobilization;Myofascial release;Muscle Energy Technique  strain counter strain technique   Manual therapy comments myofascial release and strain counter strain for increased tissue extensibility, decreased pain   Soft tissue mobilization deep tissue massage to ITB and distal posterior limb   Muscle Energy Technique contract-relax to both anatoginist and agnonist to increase right hip flexion, extension, abduction & adduction            PT Short Term Goals - 04/26/15 0845    PT SHORT TERM GOAL #1   Title Patient tolerates wear of prosthesis daily for total time >50% of awake hours. (Target Date: 05/27/2015)   Time 1   Period Months   Status New   PT SHORT TERM GOAL #2   Title Patient reports right limb pain </= 6/10 with 8 minutes of standing or gait activities.  (Target Date: 05/27/2015)   Time 1   Period Months   Status New   PT SHORT TERM GOAL #3   Title Patient ambulates 400' with LRAD & prosthesis with pain increasing <3 increments on 0-10 scale.  (Target Date: 05/27/2015)   Time 1   Period Months   Status New   PT SHORT TERM GOAL #4   Title Patient demonstrates understanding of initial HEP.  (Target Date: 05/27/2015)   Time 1   Period Months   Status New           PT Long Term Goals - 04/26/15 0845    PT LONG TERM GOAL #1   Title Patient tolerates wear of prosthesis daily for >80% of awake hours with limb pain <2/10 sitting or within 5 minutes of sitting down.  (Target Date: 06/25/2015)   Time 2   Period Months   Status New   PT LONG TERM GOAL #2   Title Patient reports right limb pain increases < 3 increments on 0-10 scale with standing or gait >10 minutes.  (Target Date: 06/25/2015)   Time 2   Period Months   Status New   PT LONG TERM GOAL #3   Title Patient  verbalizes / demonstrates understanding of ongoing fitness plan / HEP.  (Target Date: 06/25/2015)   Time 2   Period Months   Status New   PT LONG TERM GOAL #4   Title patient ambulates 1000' with prosthesis & LRAD modified independent to enable community mobility.  (Target Date: 06/25/2015)   Time 2   Period Months   Status New   PT LONG TERM GOAL #5   Title Berg Balance >40/56 to reduce fall risk.  (Target Date: 06/25/2015)  Time 2   Period Months   Status New           Plan - 05/13/15 0856    Clinical Impression Statement Pt continues to respond well to modalities and manual therapy, along with modifiied activity with AD's for decreased pain with prosthetic wear/mobility. Pt still trying to reach prosthetist to set up appointment to discuss socket revision.   Pt will benefit from skilled therapeutic intervention in order to improve on the following deficits Abnormal gait;Decreased activity tolerance;Decreased balance;Decreased endurance;Decreased mobility;Decreased range of motion;Decreased strength;Postural dysfunction;Prosthetic Dependency;Pain   Rehab Potential Good   PT Frequency 2x / week   PT Duration Other (comment)  9 weeks (60 days)   PT Treatment/Interventions ADLs/Self Care Home Management;Electrical Stimulation;Moist Heat;Ultrasound;DME Instruction;Gait training;Stair training;Functional mobility training;Therapeutic activities;Therapeutic exercise;Balance training;Neuromuscular re-education;Patient/family education;Prosthetic Training;Compression bandaging;Manual techniques;Dry needling   PT Next Visit Plan US/e-stim & manual technique to right residual limb, prosthetic gait   Consulted and Agree with Plan of Care Patient        Problem List Patient Active Problem List   Diagnosis Date Noted  . Lactic acidosis 03/10/2015  . Pain in limb-Right Leg 10/13/2013  . Syphilis 10/09/2013  . Abnormality of gait 08/14/2012  . Atherosclerosis of native artery of extremity  with intermittent claudication ([place]) 04/08/2012  . Occlusion and stenosis of carotid artery without mention of cerebral infarction 12/11/2011  . Phantom limb syndrome (right lower limb) 11/23/2011  . Health care maintenance 11/23/2011  . Hepatitis B core antibody positive 11/23/2011  . AIDS with cachexia ([place]) 11/02/2010  . MRSA colonization 09/21/2010  . Furuncle 09/21/2010  . Status post above knee amputation ([place]) 08/02/2009  . DM type 2 with diabetic peripheral neuropathy ([place]) 04/05/2009  . DYSPEPSIA 02/24/2008  . Essential (primary) hypertension 02/13/2008  . Hyperlipidemia associated with type 2 diabetes mellitus ([place]) 07/29/2007  . Human immunodeficiency virus (HIV) disease ([place]) 02/12/2006  . COLONIC POLYPS, ADENOMATOUS 02/12/2006  . Multiple sclerosis ([place]) 02/12/2006  . PERIPHERAL VASCULAR DISEASE 02/12/2006  . GERD 02/12/2006  . HEPATITIS B, HX OF (Pt is unaware of dx but states it remains in his chart no prev or current tx as of 11/2011) 02/12/2006    Willow Ora 05/14/2015, 1:41 PM  Willow Ora, PTA, [facility] [address] [place], [place] [address] [phone] 05/14/2015, 1:41 PM   Name: Nathan [name] MRN: [id number] Date of Birth: Apr 08, 1951

## 2015-05-19 ENCOUNTER — Encounter: Payer: Self-pay | Admitting: Physical Therapy

## 2015-05-19 ENCOUNTER — Ambulatory Visit: Payer: Medicare Other | Admitting: Physical Therapy

## 2015-05-19 DIAGNOSIS — Z89611 Acquired absence of right leg above knee: Secondary | ICD-10-CM

## 2015-05-19 DIAGNOSIS — R6889 Other general symptoms and signs: Secondary | ICD-10-CM | POA: Diagnosis not present

## 2015-05-19 DIAGNOSIS — R269 Unspecified abnormalities of gait and mobility: Secondary | ICD-10-CM

## 2015-05-19 DIAGNOSIS — R2689 Other abnormalities of gait and mobility: Secondary | ICD-10-CM

## 2015-05-19 DIAGNOSIS — G8918 Other acute postprocedural pain: Secondary | ICD-10-CM | POA: Diagnosis not present

## 2015-05-19 DIAGNOSIS — R29818 Other symptoms and signs involving the nervous system: Secondary | ICD-10-CM | POA: Diagnosis not present

## 2015-05-19 DIAGNOSIS — T8789 Other complications of amputation stump: Secondary | ICD-10-CM

## 2015-05-19 DIAGNOSIS — R2681 Unsteadiness on feet: Secondary | ICD-10-CM | POA: Diagnosis not present

## 2015-05-19 DIAGNOSIS — M79609 Pain in unspecified limb: Secondary | ICD-10-CM

## 2015-05-20 NOTE — Therapy (Signed)
[facility] [address] [place], [place], [address] Phone: [phone]   Fax:  [phone]  Physical Therapy Treatment  Patient Details  Name: Nathan [name] MRN: [id number] Date of Birth: 1950/08/23 Referring Provider: Harold Barban, MD  Encounter Date: 05/19/2015      PT End of Session - 05/19/15 1015    Visit Number 7   Number of Visits 18   Date for PT Re-Evaluation 06/25/15   Authorization Type Medicare G-Code and progress report every 10 visits   PT Start Time 0930   PT Stop Time 1015   PT Time Calculation (min) 45 min   Activity Tolerance Patient limited by pain   Behavior During Therapy [facility] for tasks assessed/performed      Past Medical History  Diagnosis Date  . Diabetes mellitus   . HIV infection ([place])     undetectable viral load and CD4 ct 667 as of 11/2011  . Colon polyps     noted previous colonoscopy [facility]  . Hyperlipidemia   . Multiple sclerosis ([place])     in remission as of 11/2011 (diagnosed late 1980s)  . GERD (gastroesophageal reflux disease)   . Hypertension   . MRSA (methicillin resistant Staphylococcus aureus)   . 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[place])     2002  . History of syphilis     noted [facility] records  . Asthma     per 2003 [facility] pulm records pfts   . DDD (degenerative disc disease)     cervical spine  . PVD (peripheral vascular disease) ([place])     Left Stent 07/02/2008  . Depression   . Pneumonia   . UTI (urinary tract infection)   . Clotting disorder ([place])   . Blood dyscrasia     HIV  . Fever     unknwon origin  . Hep C w/o coma, chronic ([place])   . Pain in limb-Right Leg 10/13/2013    Past Surgical History  Procedure Laterality Date  . Other surgical history      right lower ext AKA with prothesis   . Other surgical history      left left with stents (Dr. Seward Speck)  . Other surgical history      2003 colonoscopy 5 mm polyp transverse colon; (2)34m   polyps in rectum-hyperplastic  . Above knee leg amputation      Right Leg  . Lower extremity angiogram Left 12/26/2011    Procedure: LOWER EXTREMITY ANGIOGRAM;  Surgeon: VSerafina Mitchell MD;  [facility];  Service: Cardiovascular;  Laterality: Left;    There were no vitals filed for this visit.  Visit Diagnosis:  Decreased functional activity tolerance  Painful amputation stump (HCC)  Abnormality of gait  Unsteadiness  Balance problems  Status post above knee amputation of right lower extremity ([facility]      Subjective Assessment - 05/19/15 0936    Subjective He saw prosthestist last Friday. He is wearing prosthesis 3-4hrs, 2-3 x/day without issues. He has been using forearm crutches for community.    Currently in Pain? Yes   Pain Score 2    Pain Location Leg  distal lateral residual limb   Pain Orientation Right   Pain Descriptors / Indicators Squeezing;Cramping   Pain Type Chronic pain   Pain Onset More than a month ago   Pain Frequency Constant   Aggravating Factors  walking or standing   Pain Relieving Factors sitting down, removing prosthesis  [facility] Adult PT Treatment/Exercise - 05/19/15 0930    Ambulation/Gait   Ambulation/Gait Yes   Ambulation/Gait Assistance 5: Supervision   Ambulation/Gait Assistance Details verbal cues on knee flexion in swing rolling off toe in terminal stance and decreased abduction.    Ambulation Distance (Feet) 1200 Feet  1200' single crutch, 50' without device   Assistive device Prosthesis;L Axillary Crutch;None  single axillary crutch   Gait Pattern Step-through pattern;Decreased step length - left;Decreased stance time - right;Decreased stride length;Decreased hip/knee flexion - right;Decreased weight shift to right;Right circumduction;Right hip hike;Antalgic;Abducted- right   Ambulation Surface Indoor;Level   Gait velocity 2.5 ft/sec   Electrical Stimulation   Electrical Stimulation  Location right residual  limb along ITB towards Siatic Nerve concurrent with ultrasound   Electrical Stimulation Parameters 100   Electrical Stimulation Goals Pain   Ultrasound   Ultrasound Location right residual limb along ITB and distal area focusing lateral & posteriorly    Ultrasound Parameters 1.5   Ultrasound Goals Pain   Manual Therapy   Manual Therapy Soft tissue mobilization;Myofascial release;Muscle Energy Technique  strain counter strain technique   Manual therapy comments myofascial release and strain counter strain for increased tissue extensibility, decreased pain   Soft tissue mobilization deep tissue massage to ITB and distal posterior limb   Muscle Energy Technique contract-relax to both anatoginist and agnonist to increase right hip flexion, extension, abduction & adduction    Prosthetics   Current prosthetic wear tolerance (days/week)  daily   Current prosthetic wear tolerance (#hours/day)  Wearing 4hrs 3x/day, PT instructed to attempt all day wear with removing prosthesis as needed if pain is increasing to massage (manage pain)   Current prosthetic weight-bearing tolerance (hours/day)  Tolerated 8 minutes of gait with pain increasing from 1-2/10 to 3-4/10   Edema none   Residual limb condition  no open areas, callous distal posterior   Education Provided Proper wear schedule/adjustment   Person(s) Educated Patient   Education Method Explanation   Education Method Verbalized understanding                  PT Short Term Goals - 05/19/15 0930    PT SHORT TERM GOAL #1   Title Patient tolerates wear of prosthesis daily for total time >50% of awake hours. (Target Date: 05/27/2015)   Baseline MET 05/19/2015   Time 1   Period Months   Status Achieved   PT SHORT TERM GOAL #2   Title Patient reports right limb pain </= 6/10 with 8 minutes of standing or gait activities.  (Target Date: 05/27/2015)   Baseline MET 05/19/2015  Pain 3-4/10 with 8 minutes of gait with  single axillary crutch.    Time 1   Period Months   Status Achieved   PT SHORT TERM GOAL #3   Title Patient ambulates 400' with LRAD & prosthesis with pain increasing <3 increments on 0-10 scale.  (Target Date: 05/27/2015)   Baseline MET 05/19/2015 Patient ambulates 1200' with single axillary crutch with pain increasing from 1-2/10 to 3-4/10.    Time 1   Period Months   Status Achieved   PT SHORT TERM GOAL #4   Title Patient demonstrates understanding of initial HEP.  (Target Date: 05/27/2015)   Baseline MET 05/19/2015 Patient verbalized understanding of initial HEP & previously demonstrated   Time 1   Period Months   Status Achieved           PT Long Term Goals - 05/19/15 1015    PT  LONG TERM GOAL #1   Title Patient tolerates wear of prosthesis daily for >80% of awake hours with limb pain <2/10 sitting or within 5 minutes of sitting down.  (Target Date: 06/25/2015)   Time 2   Period Months   Status On-going   PT LONG TERM GOAL #2   Title Patient reports right limb pain increases < 3 increments on 0-10 scale with standing or gait >10 minutes.  (Target Date: 06/25/2015)   Time 2   Period Months   Status On-going   PT LONG TERM GOAL #3   Title Patient verbalizes / demonstrates understanding of ongoing fitness plan / HEP.  (Target Date: 06/25/2015)   Time 2   Period Months   Status On-going   PT LONG TERM GOAL #4   Title patient ambulates 1500' with prosthesis & LRAD modified independent to enable community mobility.  (Target Date: 06/25/2015)   Time 2   Period Months   Status Revised   PT LONG TERM GOAL #5   Title Berg Balance >40/56 to reduce fall risk.  (Target Date: 06/25/2015)   Time 2   Period Months   Status On-going               Plan - 05/19/15 1015    Clinical Impression Statement Patient met all STGs established at evaluation. He is ambulating up to 1200' with pain only increasing 2 increments and recovered to baseline with 5 minutes of seated rest. Patient  is on target for LTGs.    Pt will benefit from skilled therapeutic intervention in order to improve on the following deficits Abnormal gait;Decreased activity tolerance;Decreased balance;Decreased endurance;Decreased mobility;Decreased range of motion;Decreased strength;Postural dysfunction;Prosthetic Dependency;Pain   Rehab Potential Good   PT Frequency 2x / week   PT Duration Other (comment)  9 weeks (60 days)   PT Treatment/Interventions ADLs/Self Care Home Management;Electrical Stimulation;Moist Heat;Ultrasound;DME Instruction;Gait training;Stair training;Functional mobility training;Therapeutic activities;Therapeutic exercise;Balance training;Neuromuscular re-education;Patient/family education;Prosthetic Training;Compression bandaging;Manual techniques;Dry needling   PT Next Visit Plan US/e-stim & manual technique to right residual limb, prosthetic gait   Consulted and Agree with Plan of Care Patient        Problem List Patient Active Problem List   Diagnosis Date Noted  . Lactic acidosis 03/10/2015  . Pain in limb-Right Leg 10/13/2013  . Syphilis 10/09/2013  . Abnormality of gait 08/14/2012  . Atherosclerosis of native artery of extremity with intermittent claudication ([place]) 04/08/2012  . Occlusion and stenosis of carotid artery without mention of cerebral infarction 12/11/2011  . Phantom limb syndrome (right lower limb) 11/23/2011  . Health care maintenance 11/23/2011  . Hepatitis B core antibody positive 11/23/2011  . AIDS with cachexia ([place]) 11/02/2010  . MRSA colonization 09/21/2010  . Furuncle 09/21/2010  . Status post above knee amputation ([place]) 08/02/2009  . DM type 2 with diabetic peripheral neuropathy ([place]) 04/05/2009  . DYSPEPSIA 02/24/2008  . Essential (primary) hypertension 02/13/2008  . Hyperlipidemia associated with type 2 diabetes mellitus ([place]) 07/29/2007  . Human immunodeficiency virus (HIV) disease ([place]) 02/12/2006  . COLONIC POLYPS, ADENOMATOUS 02/12/2006   . Multiple sclerosis ([place]) 02/12/2006  . PERIPHERAL VASCULAR DISEASE 02/12/2006  . GERD 02/12/2006  . HEPATITIS B, HX OF (Pt is unaware of dx but states it remains in his chart no prev or current tx as of 11/2011) 02/12/2006    Velva Molinari PT, DPT 05/20/2015, 6:15 AM  [facility] [address] [place], [place], [address] Phone: [phone]   Fax:  [phone]  Name: Nathan [name] MRN: [id number] Date of Birth: 11-22-1950

## 2015-05-21 ENCOUNTER — Ambulatory Visit: Payer: Medicare Other | Admitting: Physical Therapy

## 2015-05-21 ENCOUNTER — Encounter: Payer: Self-pay | Admitting: Physical Therapy

## 2015-05-21 DIAGNOSIS — R269 Unspecified abnormalities of gait and mobility: Secondary | ICD-10-CM

## 2015-05-21 DIAGNOSIS — M79609 Pain in unspecified limb: Principal | ICD-10-CM

## 2015-05-21 DIAGNOSIS — W19XXXS Unspecified fall, sequela: Secondary | ICD-10-CM

## 2015-05-21 DIAGNOSIS — R6889 Other general symptoms and signs: Secondary | ICD-10-CM | POA: Diagnosis not present

## 2015-05-21 DIAGNOSIS — R29818 Other symptoms and signs involving the nervous system: Secondary | ICD-10-CM | POA: Diagnosis not present

## 2015-05-21 DIAGNOSIS — T8789 Other complications of amputation stump: Secondary | ICD-10-CM | POA: Diagnosis not present

## 2015-05-21 DIAGNOSIS — Z89611 Acquired absence of right leg above knee: Secondary | ICD-10-CM

## 2015-05-21 DIAGNOSIS — R2681 Unsteadiness on feet: Secondary | ICD-10-CM | POA: Diagnosis not present

## 2015-05-21 DIAGNOSIS — R2689 Other abnormalities of gait and mobility: Secondary | ICD-10-CM

## 2015-05-21 DIAGNOSIS — G8918 Other acute postprocedural pain: Secondary | ICD-10-CM | POA: Diagnosis not present

## 2015-05-24 NOTE — Therapy (Signed)
[facility] [address] [place], [place], [address] Phone: [phone]   Fax:  [phone]  Physical Therapy Treatment  Patient Details  Name: Nathan [name] MRN: [id number] Date of Birth: 01/26/1951 Referring Provider: Harold Barban, MD  Encounter Date: 05/21/2015   05/21/15 0852  PT Visits / Re-Eval  Visit Number 8  Number of Visits 18  Date for PT Re-Evaluation 06/25/15  Authorization  Authorization Type Medicare G-Code and progress report every 10 visits  PT Time Calculation  PT Start Time 0847  PT Stop Time 0927  PT Time Calculation (min) 40 min  PT - End of Session  Activity Tolerance Patient limited by pain  Behavior During Therapy [facility] for tasks assessed/performed     Past Medical History  Diagnosis Date  . Diabetes mellitus   . HIV infection ([place])     undetectable viral load and CD4 ct 667 as of 11/2011  . Colon polyps     noted previous colonoscopy [facility]  . Hyperlipidemia   . Multiple sclerosis ([place])     in remission as of 11/2011 (diagnosed late 1980s)  . GERD (gastroesophageal reflux disease)   . Hypertension   . MRSA (methicillin resistant Staphylococcus aureus)   . 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[place])     2002  . History of syphilis     noted [facility] records  . Asthma     per 2003 [facility] pulm records pfts   . DDD (degenerative disc disease)     cervical spine  . PVD (peripheral vascular disease) ([place])     Left Stent 07/02/2008  . Depression   . Pneumonia   . UTI (urinary tract infection)   . Clotting disorder ([place])   . Blood dyscrasia     HIV  . Fever     unknwon origin  . Hep C w/o coma, chronic ([place])   . Pain in limb-Right Leg 10/13/2013    Past Surgical History  Procedure Laterality Date  . Other surgical history      right lower ext AKA with prothesis   . Other surgical history      left left with stents (Dr. Seward Speck)  . Other surgical history      2003  colonoscopy 5 mm polyp transverse colon; (2)79m  polyps in rectum-hyperplastic  . Above knee leg amputation      Right Leg  . Lower extremity angiogram Left 12/26/2011    Procedure: LOWER EXTREMITY ANGIOGRAM;  Surgeon: VSerafina Mitchell MD;  [facility];  Service: Cardiovascular;  Laterality: Left;    There were no vitals filed for this visit.  Visit Diagnosis:  Painful amputation stump (HCC)  Decreased functional activity tolerance  Abnormality of gait  Unsteadiness  Balance problems  Status post above knee am[place] of right [place] extremity [place]  Falls, sequela     05/21/15 0850  Symptoms/Limitations  Subjective Reports having increased spasms late yesterday evening into the night (bedtime). Had a hard time getting muscles to relax. Better today. No falls.  Patient Stated Goals He would like to use prosthesis daily and walk like he was prior to October  Pain Assessment  Currently in Pain? Yes  Pain Score 2  Pain Location Leg (distal residual limb)  Pain Orientation Right  Pain Descriptors / Indicators Aching;Cramping;Squeezing  Pain Type Chronic pain  Pain Onset More than a month ago  Pain Frequency Constant  Aggravating Factors  increaed walking or standing  Pain Relieving Factors sitting  down, removing prosthesis      05/21/15 0853  Ambulation/Gait  Ambulation/Gait Yes  Ambulation/Gait Assistance 4: Min guard;5: Supervision  Ambulation/Gait Assistance Details cues on posture and sequence with cane  Ambulation Distance (Feet) 840 Feet  Assistive device Straight cane;Prosthesis  Gait Pattern Step-through pattern;Decreased step length - left;Decreased stance time - right;Decreased stride length;Decreased hip/knee flexion - right;Decreased weight shift to right;Right circumduction;Right hip hike;Antalgic;Abducted- right  Ambulation Surface Level;Indoor  Programme researcher, broadcasting/film/video Location right residual  limb along ITB towards Siatic  Nerve concurrent with ultrasound  Electrical Stimulation Parameters 90 volts  Electrical Stimulation Goals Pain  Ultrasound  Ultrasound Location right residual limb distal-posterior and lateral aspects  Ultrasound Parameters 1.5 w/cm2, 100%, 1 mhz  Ultrasound Goals Pain  Manual Therapy  Manual Therapy Soft tissue mobilization;Myofascial release;Muscle Energy Technique  Manual therapy comments myofascial release and strain counter strain for increased tissue extensibility, decreased pain  Soft tissue mobilization deep tissue massage to ITB and distal posterior limb  Muscle Energy Technique contract-relax to both anatoginist and agnonist to increase right hip flexion, extension, abduction & adduction   Prosthetics  Current prosthetic wear tolerance (days/week)  daily  Current prosthetic wear tolerance (#hours/day)  all awake hours with 1-2 hour breaks as needed  Edema none  Residual limb condition  no open areas, callous distal posterior          PT Short Term Goals - 05/19/15 0930    PT SHORT TERM GOAL #1   Title Patient tolerates wear of prosthesis daily for total time >50% of awake hours. (Target Date: 05/27/2015)   Baseline MET 05/19/2015   Time 1   Period Months   Status Achieved   PT SHORT TERM GOAL #2   Title Patient reports right limb pain </= 6/10 with 8 minutes of standing or gait activities.  (Target Date: 05/27/2015)   Baseline MET 05/19/2015  Pain 3-4/10 with 8 minutes of gait with single axillary crutch.    Time 1   Period Months   Status Achieved   PT SHORT TERM GOAL #3   Title Patient ambulates 400' with LRAD & prosthesis with pain increasing <3 increments on 0-10 scale.  (Target Date: 05/27/2015)   Baseline MET 05/19/2015 Patient ambulates 1200' with single axillary crutch with pain increasing from 1-2/10 to 3-4/10.    Time 1   Period Months   Status Achieved   PT SHORT TERM GOAL #4   Title Patient demonstrates understanding of initial HEP.  (Target Date:  05/27/2015)   Baseline MET 05/19/2015 Patient verbalized understanding of initial HEP & previously demonstrated   Time 1   Period Months   Status Achieved           PT Long Term Goals - 05/19/15 1015    PT LONG TERM GOAL #1   Title Patient tolerates wear of prosthesis daily for >80% of awake hours with limb pain <2/10 sitting or within 5 minutes of sitting down.  (Target Date: 06/25/2015)   Time 2   Period Months   Status On-going   PT LONG TERM GOAL #2   Title Patient reports right limb pain increases < 3 increments on 0-10 scale with standing or gait >10 minutes.  (Target Date: 06/25/2015)   Time 2   Period Months   Status On-going   PT LONG TERM GOAL #3   Title Patient verbalizes / demonstrates understanding of ongoing fitness plan / HEP.  (Target Date: 06/25/2015)   Time 2  Period Months   Status On-going   PT LONG TERM GOAL #4   Title patient ambulates 1500' with prosthesis & LRAD modified independent to enable community mobility.  (Target Date: 06/25/2015)   Time 2   Period Months   Status Revised   PT LONG TERM GOAL #5   Title Berg Balance >40/56 to reduce fall risk.  (Target Date: 06/25/2015)   Time 2   Period Months   Status On-going        05/21/15 0852  Plan  Clinical Impression Statement Pt continues to make progress toward goals with less pain with increased acivity. He does on occasion report episodes of high pain. Pt is making progress toward goals.  Pt will benefit from skilled therapeutic intervention in order to improve on the following deficits Abnormal gait;Decreased activity tolerance;Decreased balance;Decreased endurance;Decreased mobility;Decreased range of motion;Decreased strength;Postural dysfunction;Prosthetic Dependency;Pain  Rehab Potential Good  PT Frequency 2x / week  PT Duration Other (comment) (9 weeks (60 days))  PT Treatment/Interventions ADLs/Self Care Home Management;Electrical Stimulation;Moist Heat;Ultrasound;DME Instruction;Gait  training;Stair training;Functional mobility training;Therapeutic activities;Therapeutic exercise;Balance training;Neuromuscular re-education;Patient/family education;Prosthetic Training;Compression bandaging;Manual techniques;Dry needling  PT Next Visit Plan US/e-stim & manual technique to right residual limb, prosthetic gait  Consulted and Agree with Plan of Care Patient      Problem List Patient Active Problem List   Diagnosis Date Noted  . Lactic acidosis 03/10/2015  . Pain in limb-Right Leg 10/13/2013  . Syphilis 10/09/2013  . Abnormality of gait 08/14/2012  . Atherosclerosis of native artery of extremity with intermittent claudication ([place]) 04/08/2012  . Occlusion and stenosis of carotid artery without mention of cerebral infarction 12/11/2011  . Phantom limb syndrome (right lower limb) 11/23/2011  . Health care maintenance 11/23/2011  . Hepatitis B core antibody positive 11/23/2011  . AIDS with cachexia ([place]) 11/02/2010  . MRSA colonization 09/21/2010  . Furuncle 09/21/2010  . Status post above knee amputation ([place]) 08/02/2009  . DM type 2 with diabetic peripheral neuropathy ([place]) 04/05/2009  . DYSPEPSIA 02/24/2008  . Essential (primary) hypertension 02/13/2008  . Hyperlipidemia associated with type 2 diabetes mellitus ([place]) 07/29/2007  . Human immunodeficiency virus (HIV) disease ([place]) 02/12/2006  . COLONIC POLYPS, ADENOMATOUS 02/12/2006  . Multiple sclerosis ([place]) 02/12/2006  . PERIPHERAL VASCULAR DISEASE 02/12/2006  . GERD 02/12/2006  . HEPATITIS B, HX OF (Pt is unaware of dx but states it remains in his chart no prev or current tx as of 11/2011) 02/12/2006    Willow Ora 05/24/2015, 10:11 PM  Willow Ora, PTA, [facility] [address] [place], [place] [address] [phone] 05/24/2015, 10:12 PM   Name: Nathan [name] MRN: [id number] Date of Birth: 1950-05-15

## 2015-05-25 ENCOUNTER — Encounter: Payer: Self-pay | Admitting: Physical Therapy

## 2015-05-25 ENCOUNTER — Ambulatory Visit: Payer: Medicare Other | Admitting: Physical Therapy

## 2015-05-25 DIAGNOSIS — R2681 Unsteadiness on feet: Secondary | ICD-10-CM

## 2015-05-25 DIAGNOSIS — R269 Unspecified abnormalities of gait and mobility: Secondary | ICD-10-CM

## 2015-05-25 DIAGNOSIS — R29818 Other symptoms and signs involving the nervous system: Secondary | ICD-10-CM | POA: Diagnosis not present

## 2015-05-25 DIAGNOSIS — T8789 Other complications of amputation stump: Secondary | ICD-10-CM | POA: Diagnosis not present

## 2015-05-25 DIAGNOSIS — W19XXXS Unspecified fall, sequela: Secondary | ICD-10-CM

## 2015-05-25 DIAGNOSIS — R6889 Other general symptoms and signs: Secondary | ICD-10-CM | POA: Diagnosis not present

## 2015-05-25 DIAGNOSIS — G8918 Other acute postprocedural pain: Secondary | ICD-10-CM | POA: Diagnosis not present

## 2015-05-25 DIAGNOSIS — M79609 Pain in unspecified limb: Principal | ICD-10-CM

## 2015-05-25 DIAGNOSIS — R2689 Other abnormalities of gait and mobility: Secondary | ICD-10-CM

## 2015-05-25 DIAGNOSIS — Z89611 Acquired absence of right leg above knee: Secondary | ICD-10-CM

## 2015-05-26 NOTE — Therapy (Signed)
[facility] [address] [place], [place], [address] Phone: [phone]   Fax:  [phone]  Physical Therapy Treatment  Patient Details  Name: Nathan [name] MRN: [id number] Date of Birth: 1950-05-19 Referring Provider: Harold Barban, MD  Encounter Date: 05/25/2015      PT End of Session - 05/25/15 1015    Visit Number 9   Number of Visits 18   Date for PT Re-Evaluation 06/25/15   Authorization Type Medicare G-Code and progress report every 10 visits   PT Start Time 0930   PT Stop Time 1015   PT Time Calculation (min) 45 min   Activity Tolerance Patient limited by pain   Behavior During Therapy [facility] for tasks assessed/performed      Past Medical History  Diagnosis Date  . Diabetes mellitus   . HIV infection ([place])     undetectable viral load and CD4 ct 667 as of 11/2011  . Colon polyps     noted previous colonoscopy [facility]  . Hyperlipidemia   . Multiple sclerosis ([place])     in remission as of 11/2011 (diagnosed late 1980s)  . GERD (gastroesophageal reflux disease)   . Hypertension   . MRSA (methicillin resistant Staphylococcus aureus)   . 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[place])     2002  . History of syphilis     noted [facility] records  . Asthma     per 2003 [facility] pulm records pfts   . DDD (degenerative disc disease)     cervical spine  . PVD (peripheral vascular disease) ([place])     Left Stent 07/02/2008  . Depression   . Pneumonia   . UTI (urinary tract infection)   . Clotting disorder ([place])   . Blood dyscrasia     HIV  . Fever     unknwon origin  . Hep C w/o coma, chronic ([place])   . Pain in limb-Right Leg 10/13/2013    Past Surgical History  Procedure Laterality Date  . Other surgical history      right lower ext AKA with prothesis   . Other surgical history      left left with stents (Dr. Seward Speck)  . Other surgical history      2003 colonoscopy 5 mm polyp transverse colon; (2)42m   polyps in rectum-hyperplastic  . Above knee leg amputation      Right Leg  . Lower extremity angiogram Left 12/26/2011    Procedure: LOWER EXTREMITY ANGIOGRAM;  Surgeon: VSerafina Mitchell MD;  [facility];  Service: Cardiovascular;  Laterality: Left;    There were no vitals filed for this visit.  Visit Diagnosis:  Painful amputation stump (HCC)  Decreased functional activity tolerance  Abnormality of gait  Unsteadiness  Balance problems  Status post above knee amputation of right lower extremity (HCC)  Falls, sequela      Subjective Assessment - 05/25/15 0935    Subjective He reports pain is better over all but has "Charlie Horse" for 1-2 days then none for a week. Stretching & massaging help relieve the "Charlie Horse" but they last 1-2 minutes.    Currently in Pain? Yes   Pain Score 1    Pain Location Leg   Pain Orientation Right   Pain Descriptors / Indicators Aching;Cramping;Squeezing   Pain Type Chronic pain   Pain Onset More than a month ago   Pain Frequency Constant   Aggravating Factors  increased walking   Pain Relieving Factors sitting  down, removing prosthesis   Multiple Pain Sites No                         [facility] Adult PT Treatment/Exercise - 05/25/15 0930    Ambulation/Gait   Ambulation/Gait Yes   Ambulation/Gait Assistance 5: Supervision   Ambulation/Gait Assistance Details cues on pelvis orientation, prosthesis advancement rolling off toe in terminal stance to flex knee in swing to decrease hip hiking & circumduction   Ambulation Distance (Feet) 900 Feet  pain prior 2-3/10 and after 3-4/10   Assistive device Straight cane;Prosthesis   Gait Pattern Step-through pattern;Decreased step length - left;Decreased stance time - right;Decreased stride length;Decreased hip/knee flexion - right;Decreased weight shift to right;Right circumduction;Right hip hike;Antalgic;Abducted- right   Ambulation Surface Indoor;Level   Electrical  Stimulation   Electrical Stimulation Location right residual  limb along ITB towards Siatic Nerve concurrent with ultrasound   Electrical Stimulation Parameters 100 m amps   Electrical Stimulation Goals Pain   Ultrasound   Ultrasound Location right residual limb laterally along ITB and posterior distal limb   Ultrasound Parameters 1.5 watts/cm2   Ultrasound Goals Pain   Manual Therapy   Manual Therapy Soft tissue mobilization;Myofascial release;Muscle Energy Technique   Manual therapy comments myofascial release and strain counter strain for increased tissue extensibility, decreased pain   Soft tissue mobilization deep tissue massage to ITB and distal posterior limb with added stretch in hip flexion   Muscle Energy Technique contract-relax to both anatoginist and agnonist to increase right hip flexion, extension, abduction & adduction    Prosthetics   Current prosthetic wear tolerance (days/week)  daily   Current prosthetic wear tolerance (#hours/day)  all awake hours with 1/2 hour breaks as needed   Edema none   Residual limb condition  no open areas, callous distal posterior                PT Education - 05/25/15 1015    Education provided Yes   Education Details standing stretches to hamstring / posterior and ITB / lateral residual limb with prosthesis   Person(s) Educated Patient   Methods Explanation;Demonstration;Tactile cues;Verbal cues   Comprehension Verbalized understanding;Returned demonstration;Verbal cues required;Tactile cues required;Need further instruction          PT Short Term Goals - 05/19/15 0930    PT SHORT TERM GOAL #1   Title Patient tolerates wear of prosthesis daily for total time >50% of awake hours. (Target Date: 05/27/2015)   Baseline MET 05/19/2015   Time 1   Period Months   Status Achieved   PT SHORT TERM GOAL #2   Title Patient reports right limb pain </= 6/10 with 8 minutes of standing or gait activities.  (Target Date: 05/27/2015)    Baseline MET 05/19/2015  Pain 3-4/10 with 8 minutes of gait with single axillary crutch.    Time 1   Period Months   Status Achieved   PT SHORT TERM GOAL #3   Title Patient ambulates 400' with LRAD & prosthesis with pain increasing <3 increments on 0-10 scale.  (Target Date: 05/27/2015)   Baseline MET 05/19/2015 Patient ambulates 1200' with single axillary crutch with pain increasing from 1-2/10 to 3-4/10.    Time 1   Period Months   Status Achieved   PT SHORT TERM GOAL #4   Title Patient demonstrates understanding of initial HEP.  (Target Date: 05/27/2015)   Baseline MET 05/19/2015 Patient verbalized understanding of initial HEP & previously demonstrated  Time 1   Period Months   Status Achieved           PT Long Term Goals - 05/19/15 1015    PT LONG TERM GOAL #1   Title Patient tolerates wear of prosthesis daily for >80% of awake hours with limb pain <2/10 sitting or within 5 minutes of sitting down.  (Target Date: 06/25/2015)   Time 2   Period Months   Status On-going   PT LONG TERM GOAL #2   Title Patient reports right limb pain increases < 3 increments on 0-10 scale with standing or gait >10 minutes.  (Target Date: 06/25/2015)   Time 2   Period Months   Status On-going   PT LONG TERM GOAL #3   Title Patient verbalizes / demonstrates understanding of ongoing fitness plan / HEP.  (Target Date: 06/25/2015)   Time 2   Period Months   Status On-going   PT LONG TERM GOAL #4   Title patient ambulates 1500' with prosthesis & LRAD modified independent to enable community mobility.  (Target Date: 06/25/2015)   Time 2   Period Months   Status Revised   PT LONG TERM GOAL #5   Title Berg Balance >40/56 to reduce fall risk.  (Target Date: 06/25/2015)   Time 2   Period Months   Status On-going               Plan - 05/25/15 1015    Clinical Impression Statement Patient reports less pain including with prosthesis wear and increased standing / gait activities. He appears to  understand standing stretches that can be performed while wearing prosthesis to stretch posterior and lateral muscles.    Pt will benefit from skilled therapeutic intervention in order to improve on the following deficits Abnormal gait;Decreased activity tolerance;Decreased balance;Decreased endurance;Decreased mobility;Decreased range of motion;Decreased strength;Postural dysfunction;Prosthetic Dependency;Pain   Rehab Potential Good   PT Frequency 2x / week   PT Duration Other (comment)  9 weeks (60 days)   PT Treatment/Interventions ADLs/Self Care Home Management;Electrical Stimulation;Moist Heat;Ultrasound;DME Instruction;Gait training;Stair training;Functional mobility training;Therapeutic activities;Therapeutic exercise;Balance training;Neuromuscular re-education;Patient/family education;Prosthetic Training;Compression bandaging;Manual techniques;Dry needling   PT Next Visit Plan Do G-code and progress note using pain, standing & gait. US/e-stim & manual technique to right residual limb, prosthetic gait   Consulted and Agree with Plan of Care Patient        Problem List Patient Active Problem List   Diagnosis Date Noted  . Lactic acidosis 03/10/2015  . Pain in limb-Right Leg 10/13/2013  . Syphilis 10/09/2013  . Abnormality of gait 08/14/2012  . Atherosclerosis of native artery of extremity with intermittent claudication ([place]) 04/08/2012  . Occlusion and stenosis of carotid artery without mention of cerebral infarction 12/11/2011  . Phantom limb syndrome (right lower limb) 11/23/2011  . Health care maintenance 11/23/2011  . Hepatitis B core antibody positive 11/23/2011  . AIDS with cachexia ([place]) 11/02/2010  . MRSA colonization 09/21/2010  . Furuncle 09/21/2010  . Status post above knee amputation ([place]) 08/02/2009  . DM type 2 with diabetic peripheral neuropathy ([place]) 04/05/2009  . DYSPEPSIA 02/24/2008  . Essential (primary) hypertension 02/13/2008  . Hyperlipidemia associated  with type 2 diabetes mellitus ([place]) 07/29/2007  . Human immunodeficiency virus (HIV) disease ([place]) 02/12/2006  . COLONIC POLYPS, ADENOMATOUS 02/12/2006  . Multiple sclerosis ([place]) 02/12/2006  . PERIPHERAL VASCULAR DISEASE 02/12/2006  . GERD 02/12/2006  . HEPATITIS B, HX OF (Pt is unaware of dx but states it remains in his chart no  prev or current tx as of 11/2011) 02/12/2006    , PT, DPT 05/26/2015, 9:12 AM  [facility] [address] [place], [place], [address] Phone: [phone]   Fax:  [phone]  Name: Nathan [name] MRN: [id number] Date of Birth: May 05, 1951

## 2015-05-27 ENCOUNTER — Encounter: Payer: Self-pay | Admitting: Physical Therapy

## 2015-05-27 ENCOUNTER — Ambulatory Visit: Payer: Medicare Other | Admitting: Physical Therapy

## 2015-05-27 DIAGNOSIS — M79609 Pain in unspecified limb: Principal | ICD-10-CM

## 2015-05-27 DIAGNOSIS — G8918 Other acute postprocedural pain: Secondary | ICD-10-CM | POA: Diagnosis not present

## 2015-05-27 DIAGNOSIS — R29818 Other symptoms and signs involving the nervous system: Secondary | ICD-10-CM | POA: Diagnosis not present

## 2015-05-27 DIAGNOSIS — T8789 Other complications of amputation stump: Secondary | ICD-10-CM | POA: Diagnosis not present

## 2015-05-27 DIAGNOSIS — R6889 Other general symptoms and signs: Secondary | ICD-10-CM

## 2015-05-27 DIAGNOSIS — R269 Unspecified abnormalities of gait and mobility: Secondary | ICD-10-CM | POA: Diagnosis not present

## 2015-05-27 DIAGNOSIS — R2681 Unsteadiness on feet: Secondary | ICD-10-CM | POA: Diagnosis not present

## 2015-05-27 DIAGNOSIS — Z89611 Acquired absence of right leg above knee: Secondary | ICD-10-CM

## 2015-05-27 DIAGNOSIS — R2689 Other abnormalities of gait and mobility: Secondary | ICD-10-CM

## 2015-05-28 ENCOUNTER — Encounter: Payer: Self-pay | Admitting: Physical Therapy

## 2015-05-28 NOTE — Therapy (Signed)
[facility] [address] [place], [place], [address] Phone: [phone]   Fax:  [phone]  Physical Therapy Treatment  Patient Details  Name: Nathan [name] MRN: [id number] Date of Birth: 04/16/1951 Referring Provider: Harold Barban, MD  Encounter Date: 05/27/2015      PT End of Session - 05/27/15 1105    Visit Number 10   Number of Visits 18   Date for PT Re-Evaluation 06/25/15   Authorization Type Medicare G-Code and progress report every 10 visits   PT Start Time 1026   PT Stop Time 1105   PT Time Calculation (min) 39 min   Activity Tolerance Patient limited by pain   Behavior During Therapy [facility] for tasks assessed/performed      Past Medical History  Diagnosis Date  . Diabetes mellitus   . HIV infection ([place])     undetectable viral load and CD4 ct 667 as of 11/2011  . Colon polyps     noted previous colonoscopy [facility]  . Hyperlipidemia   . Multiple sclerosis ([place])     in remission as of 11/2011 (diagnosed late 1980s)  . GERD (gastroesophageal reflux disease)   . Hypertension   . MRSA (methicillin resistant Staphylococcus aureus)   . 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[place])     2002  . History of syphilis     noted [facility] records  . Asthma     per 2003 [facility] pulm records pfts   . DDD (degenerative disc disease)     cervical spine  . PVD (peripheral vascular disease) ([place])     Left Stent 07/02/2008  . Depression   . Pneumonia   . UTI (urinary tract infection)   . Clotting disorder ([place])   . Blood dyscrasia     HIV  . Fever     unknwon origin  . Hep C w/o coma, chronic ([place])   . Pain in limb-Right Leg 10/13/2013    Past Surgical History  Procedure Laterality Date  . Other surgical history      right lower ext AKA with prothesis   . Other surgical history      left left with stents (Dr. Seward Speck)  . Other surgical history      2003 colonoscopy 5 mm polyp transverse colon; (2)36m   polyps in rectum-hyperplastic  . Above knee leg amputation      Right Leg  . Lower extremity angiogram Left 12/26/2011    Procedure: LOWER EXTREMITY ANGIOGRAM;  Surgeon: VSerafina Mitchell MD;  [facility];  Service: Cardiovascular;  Laterality: Left;    There were no vitals filed for this visit.  Visit Diagnosis:  Painful amputation stump (HCC)  Decreased functional activity tolerance  Abnormality of gait  Unsteadiness  Balance problems  Status post above knee amputation of right lower extremity (HCC)      Subjective Assessment - 05/27/15 1023    Subjective Area on back of limb is sore from work. No "[employer]" since last session He has been doing his stretches.    Currently in Pain? Yes   Pain Score 1    Pain Location Leg   Pain Orientation Right   Pain Descriptors / Indicators Aching;Cramping;Squeezing   Pain Type Chronic pain   Pain Onset More than a month ago   Pain Frequency Constant   Aggravating Factors  increased walking    Pain Relieving Factors sitting down, removing prosthesis   Multiple Pain Sites No  [facility] Adult PT Treatment/Exercise - 05/27/15 1026    Ambulation/Gait   Ambulation/Gait Yes   Ambulation/Gait Assistance 5: Supervision   Ambulation Distance (Feet) 100 Feet  100' X 2   Assistive device Prosthesis;None   Gait Pattern Step-through pattern;Decreased step length - left;Decreased stance time - right;Decreased stride length;Decreased hip/knee flexion - right;Decreased weight shift to right;Right circumduction;Right hip hike;Antalgic;Abducted- right   Ambulation Surface Indoor;Level   Stairs Yes   Stairs Assistance 4: Min guard;5: Supervision   Stairs Assistance Details (indicate cue type and reason) PT demo, instructed prior & verbal /manual cues during how to use microprocessor hydraulics to descend stairs technique with foot position, knee unlocked slightly and pelvis / wt directly over  prosthesis. PT also instructed this is safe way for body to learn to lower wt in controlled situation which is same mechanism it uses to assist fall prevention   Stair Management Technique Two rails;Step to pattern;Forwards;One rail Left;With crutches  initially with 2 rails, progressed to his home Lrail/Rcrutch   Number of Stairs 4  10 reps (5 w/2 rails, 5 w/Lrail-Rcrutch)   Electrical Stimulation   Electrical Stimulation Location right residual  limb along ITB towards Siatic Nerve concurrent with ultrasound   Electrical Stimulation Parameters 100 m amp   Electrical Stimulation Goals Pain   Ultrasound   Ultrasound Location right residual limb along ITB and distal lateral & distal posterior   Ultrasound Parameters 1.5 watts/cm2   Ultrasound Goals Pain   Manual Therapy   Manual Therapy Soft tissue mobilization;Myofascial release;Muscle Energy Technique   Manual therapy comments myofascial release and strain counter strain for increased tissue extensibility, decreased pain   Soft tissue mobilization deep tissue massage to ITB and distal posterior limb with added stretch in hip flexion   Muscle Energy Technique contract-relax to both anatoginist and agnonist to increase right hip flexion, extension, abduction & adduction    Prosthetics   Current prosthetic wear tolerance (days/week)  daily   Current prosthetic wear tolerance (#hours/day)  all awake hours with 1/2 hour breaks as needed   Edema none   Residual limb condition  no open areas, callous distal posterior                  PT Short Term Goals - 05/19/15 0930    PT SHORT TERM GOAL #1   Title Patient tolerates wear of prosthesis daily for total time >50% of awake hours. (Target Date: 05/27/2015)   Baseline MET 05/19/2015   Time 1   Period Months   Status Achieved   PT SHORT TERM GOAL #2   Title Patient reports right limb pain </= 6/10 with 8 minutes of standing or gait activities.  (Target Date: 05/27/2015)   Baseline MET  05/19/2015  Pain 3-4/10 with 8 minutes of gait with single axillary crutch.    Time 1   Period Months   Status Achieved   PT SHORT TERM GOAL #3   Title Patient ambulates 400' with LRAD & prosthesis with pain increasing <3 increments on 0-10 scale.  (Target Date: 05/27/2015)   Baseline MET 05/19/2015 Patient ambulates 1200' with single axillary crutch with pain increasing from 1-2/10 to 3-4/10.    Time 1   Period Months   Status Achieved   PT SHORT TERM GOAL #4   Title Patient demonstrates understanding of initial HEP.  (Target Date: 05/27/2015)   Baseline MET 05/19/2015 Patient verbalized understanding of initial HEP & previously demonstrated   Time 1   Period Months  Status Achieved           PT Long Term Goals - 05/19/15 1015    PT LONG TERM GOAL #1   Title Patient tolerates wear of prosthesis daily for >80% of awake hours with limb pain <2/10 sitting or within 5 minutes of sitting down.  (Target Date: 06/25/2015)   Time 2   Period Months   Status On-going   PT LONG TERM GOAL #2   Title Patient reports right limb pain increases < 3 increments on 0-10 scale with standing or gait >10 minutes.  (Target Date: 06/25/2015)   Time 2   Period Months   Status On-going   PT LONG TERM GOAL #3   Title Patient verbalizes / demonstrates understanding of ongoing fitness plan / HEP.  (Target Date: 06/25/2015)   Time 2   Period Months   Status On-going   PT LONG TERM GOAL #4   Title patient ambulates 1500' with prosthesis & LRAD modified independent to enable community mobility.  (Target Date: 06/25/2015)   Time 2   Period Months   Status Revised   PT LONG TERM GOAL #5   Title Berg Balance >40/56 to reduce fall risk.  (Target Date: 06/25/2015)   Time 2   Period Months   Status On-going               Plan - 06/02/2015 1105    Clinical Impression Statement Patient has less pain with standing & gait now. He reports increased activity level without pain afterwards. Patient understands  how to use hydraulics on stairs to lower body wt but is fearful at this time so will need additional practice.    Pt will benefit from skilled therapeutic intervention in order to improve on the following deficits Abnormal gait;Decreased activity tolerance;Decreased balance;Decreased endurance;Decreased mobility;Decreased range of motion;Decreased strength;Postural dysfunction;Prosthetic Dependency;Pain   Rehab Potential Good   PT Frequency 2x / week   PT Duration Other (comment)  9 weeks (60 days)   PT Treatment/Interventions ADLs/Self Care Home Management;Electrical Stimulation;Moist Heat;Ultrasound;DME Instruction;Gait training;Stair training;Functional mobility training;Therapeutic activities;Therapeutic exercise;Balance training;Neuromuscular re-education;Patient/family education;Prosthetic Training;Compression bandaging;Manual techniques;Dry needling   PT Next Visit Plan standing & gait. US/e-stim & manual technique to right residual limb, prosthetic gait, work on descending stairs using hydraulics   Consulted and Agree with Plan of Care Patient          G-Codes - 02-Jun-2015 1105    Functional Assessment Tool Used Pain in right limb 1-2/10 prior to standing and 3-4/10 with gait up to 900' with single crutch or cane and up to 100' without device except prosthesis. He tolerates prosthesis wear most of awake hours.    Functional Limitation Other PT primary   Other PT Primary Current Status (B5597) At least 20 percent but less than 40 percent impaired, limited or restricted   Other PT Primary Goal Status (C1638) At least 1 percent but less than 20 percent impaired, limited or restricted      Problem List Patient Active Problem List   Diagnosis Date Noted  . Lactic acidosis 03/10/2015  . Pain in limb-Right Leg 10/13/2013  . Syphilis 10/09/2013  . Abnormality of gait 08/14/2012  . Atherosclerosis of native artery of extremity with intermittent claudication ([place]) 04/08/2012  . Occlusion  and stenosis of carotid artery without mention of cerebral infarction 12/11/2011  . Phantom limb syndrome (right lower limb) 11/23/2011  . Health care maintenance 11/23/2011  . Hepatitis B core antibody positive 11/23/2011  . AIDS with cachexia (  [place]) 11/02/2010  . MRSA colonization 09/21/2010  . Furuncle 09/21/2010  . Status post above knee amputation ([place]) 08/02/2009  . DM type 2 with diabetic peripheral neuropathy ([place]) 04/05/2009  . DYSPEPSIA 02/24/2008  . Essential (primary) hypertension 02/13/2008  . Hyperlipidemia associated with type 2 diabetes mellitus ([place]) 07/29/2007  . Human immunodeficiency virus (HIV) disease ([place]) 02/12/2006  . COLONIC POLYPS, ADENOMATOUS 02/12/2006  . Multiple sclerosis ([place]) 02/12/2006  . PERIPHERAL VASCULAR DISEASE 02/12/2006  . GERD 02/12/2006  . HEPATITIS B, HX OF (Pt is unaware of dx but states it remains in his chart no prev or current tx as of 11/2011) 02/12/2006    Destynie Toomey PT, DPT 05/28/2015, 7:34 AM  [facility] [address] [place], [place], [address] Phone: [phone]   Fax:  [phone]  Name: Nathan [name] MRN: [id number] Date of Birth: 22-Apr-1951

## 2015-05-28 NOTE — Therapy (Signed)
[facility] [address] [place], [place], [address] Phone: [phone]   Fax:  [phone]  Patient Details  Name: Nathan [name] MRN: [id number] Date of Birth: 05-01-1951 Referring Provider:  Harold Barban, MD  Encounter Date: 05/28/2015   Physical Therapy Progress Note  Dates of Reporting Period: 04/26/2015 to 05/27/2015  Objective Reports of Subjective Statement: Patient reports standing and gait up to 10 minutes with limb pain increasing from 1-2/10 to 3-4/10. He is tolerating wear of prosthesis most of his awake hours.   Objective Measurements: Patient ambulates up to 1200' with single crutch and 100' with only prosthesis without pain increasing more than 2 increments on pain scale.   Goal Update:      PT Short Term Goals - 05/19/15 0930    PT SHORT TERM GOAL #1   Title Patient tolerates wear of prosthesis daily for total time >50% of awake hours. (Target Date: 05/27/2015)   Baseline MET 05/19/2015   Time 1   Period Months   Status Achieved   PT SHORT TERM GOAL #2   Title Patient reports right limb pain </= 6/10 with 8 minutes of standing or gait activities.  (Target Date: 05/27/2015)   Baseline MET 05/19/2015  Pain 3-4/10 with 8 minutes of gait with single axillary crutch.    Time 1   Period Months   Status Achieved   PT SHORT TERM GOAL #3   Title Patient ambulates 400' with LRAD & prosthesis with pain increasing <3 increments on 0-10 scale.  (Target Date: 05/27/2015)   Baseline MET 05/19/2015 Patient ambulates 1200' with single axillary crutch with pain increasing from 1-2/10 to 3-4/10.    Time 1   Period Months   Status Achieved   PT SHORT TERM GOAL #4   Title Patient demonstrates understanding of initial HEP.  (Target Date: 05/27/2015)   Baseline MET 05/19/2015 Patient verbalized understanding of initial HEP & previously demonstrated   Time 1   Period Months   Status Achieved      Plan: continue with  initial evaluation plan of care  Reason Skilled Services are Required: Patient requires skilled PT to decrease residual limb pain and progress mobility without pain increasing to his maximal functional potential.    Jullian Clayson PT, DPT  05/28/2015, 7:36 AM  [facility] [address] [place], [place], [address] Phone: [phone]   Fax:  [phone]

## 2015-05-31 ENCOUNTER — Other Ambulatory Visit (HOSPITAL_COMMUNITY)
Admission: RE | Admit: 2015-05-31 | Discharge: 2015-05-31 | Disposition: A | Discharge status: Home / Self Care | Payer: Medicare Other | Source: Ambulatory Visit | Attending: Infectious Disease | Admitting: Infectious Disease

## 2015-05-31 ENCOUNTER — Other Ambulatory Visit: Payer: Self-pay | Admitting: *Deleted

## 2015-05-31 ENCOUNTER — Telehealth: Payer: Self-pay | Admitting: *Deleted

## 2015-05-31 ENCOUNTER — Other Ambulatory Visit: Payer: Self-pay

## 2015-05-31 ENCOUNTER — Ambulatory Visit: Payer: Medicare Other | Admitting: Physical Therapy

## 2015-05-31 ENCOUNTER — Encounter: Payer: Self-pay | Admitting: Physical Therapy

## 2015-05-31 DIAGNOSIS — R300 Dysuria: Secondary | ICD-10-CM

## 2015-05-31 DIAGNOSIS — M79661 Pain in right lower leg: Secondary | ICD-10-CM

## 2015-05-31 DIAGNOSIS — R2681 Unsteadiness on feet: Secondary | ICD-10-CM

## 2015-05-31 DIAGNOSIS — Z89611 Acquired absence of right leg above knee: Secondary | ICD-10-CM

## 2015-05-31 DIAGNOSIS — R269 Unspecified abnormalities of gait and mobility: Secondary | ICD-10-CM

## 2015-05-31 DIAGNOSIS — R6889 Other general symptoms and signs: Secondary | ICD-10-CM

## 2015-05-31 DIAGNOSIS — T8789 Other complications of amputation stump: Secondary | ICD-10-CM | POA: Diagnosis not present

## 2015-05-31 DIAGNOSIS — Z113 Encounter for screening for infections with a predominantly sexual mode of transmission: Secondary | ICD-10-CM | POA: Diagnosis not present

## 2015-05-31 DIAGNOSIS — M79609 Pain in unspecified limb: Principal | ICD-10-CM

## 2015-05-31 DIAGNOSIS — R29818 Other symptoms and signs involving the nervous system: Secondary | ICD-10-CM | POA: Diagnosis not present

## 2015-05-31 DIAGNOSIS — G8918 Other acute postprocedural pain: Secondary | ICD-10-CM | POA: Diagnosis not present

## 2015-05-31 DIAGNOSIS — R2689 Other abnormalities of gait and mobility: Secondary | ICD-10-CM

## 2015-05-31 MED ORDER — OXYCODONE HCL 10 MG PO TABS: 10.0000 mg | ORAL_TABLET | Freq: Three times a day (TID) | ORAL | Status: DC | PRN

## 2015-05-31 NOTE — Therapy (Signed)
[facility] [address] [place], [place], [address] Phone: [phone]   Fax:  [phone]  Physical Therapy Treatment  Patient Details  Name: Nathan [name] MRN: [id number] Date of Birth: [birth year] Referring Provider: Harold Barban, MD  Encounter Date: 05/31/2015      PT End of Session - 05/31/15 0930    Visit Number 11   Number of Visits 18   Date for PT Re-Evaluation 06/25/15   Authorization Type Medicare G-Code and progress report every 10 visits   PT Start Time 0845   PT Stop Time 0930   PT Time Calculation (min) 45 min   Activity Tolerance Patient limited by pain   Behavior During Therapy [facility] for tasks assessed/performed      Past Medical History  Diagnosis Date  . Diabetes mellitus   . HIV infection ([place])     undetectable viral load and CD4 ct 667 as of 11/2011  . Colon polyps     noted previous colonoscopy [facility]  . Hyperlipidemia   . Multiple sclerosis ([place])     in remission as of 11/2011 (diagnosed late 1980s)  . GERD (gastroesophageal reflux disease)   . Hypertension   . MRSA (methicillin resistant Staphylococcus aureus)   . 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[place])     2002  . History of syphilis     noted [facility] records  . Asthma     per 2003 [facility] pulm records pfts   . DDD (degenerative disc disease)     cervical spine  . PVD (peripheral vascular disease) ([place])     Left Stent 07/02/2008  . Depression   . Pneumonia   . UTI (urinary tract infection)   . Clotting disorder ([place])   . Blood dyscrasia     HIV  . Fever     unknwon origin  . Hep C w/o coma, chronic ([place])   . Pain in limb-Right Leg 10/13/2013    Past Surgical History  Procedure Laterality Date  . Other surgical history      right lower ext AKA with prothesis   . Other surgical history      left left with stents (Dr. Seward Speck)  . Other surgical history      2003 colonoscopy 5 mm polyp transverse colon; (2)43m   polyps in rectum-hyperplastic  . Above knee leg amputation      Right Leg  . Lower extremity angiogram Left 12/26/2011    Procedure: LOWER EXTREMITY ANGIOGRAM;  Surgeon: VSerafina Mitchell MD;  [facility];  Service: Cardiovascular;  Laterality: Left;    There were no vitals filed for this visit.  Visit Diagnosis:  Painful amputation stump (HCC)  Decreased functional activity tolerance  Abnormality of gait  Unsteadiness  Balance problems  Status post above knee amputation of right lower extremity (HCC)      Subjective Assessment - 05/31/15 0849    Subjective No falls. He used one crutch some & no device some with no significant difference. Pain 3-4/10 at most and he walked a lot on Saturday.    Currently in Pain? Yes   Pain Score 2    Pain Location Leg   Pain Orientation Right   Pain Descriptors / Indicators Aching;Cramping;Squeezing   Pain Type Chronic pain   Pain Onset More than a month ago   Pain Frequency Constant   Aggravating Factors  increased walking   Pain Relieving Factors sitting down, removing prosthesis   Multiple Pain Sites No  [facility] [place] [facility] Adult PT Treatment/Exercise - 05/31/15 0845    Ambulation/Gait   Ambulation/Gait Yes   Ambulation/Gait Assistance 5: Supervision   Ambulation Distance (Feet) 100 Feet  100' X 2   Assistive device Prosthesis;None   Gait Pattern Step-through pattern;Decreased step length - left;Decreased stance time - right;Decreased stride length;Decreased hip/knee flexion - right;Decreased weight shift to right;Right circumduction;Right hip hike;Antalgic;Abducted- right   Stairs Yes   Stairs Assistance 5: Supervision   Stairs Assistance Details (indicate cue type and reason) PT demo, reviewed technique for using prosthetic hydraulics to descend stairs   Stair Management Technique Two rails;Step to pattern;Forwards;One rail Left;With crutches  initially with 2 rails, progressed to his home  Lrail/Rcrutch   Number of Stairs 4  10 reps (5 w/2 rails, 5 w/Lrail-Rcrutch)   Electrical Stimulation   Electrical Stimulation Location right residual  limb along ITB towards Siatic Nerve concurrent with ultrasound   Electrical Stimulation Parameters 100 m amps with 4 electrodes cross pattern   Electrical Stimulation Goals Pain   Ultrasound   Ultrasound Location right residual limb along ITB and distal-lateral & distalc-posterior   Ultrasound Parameters 1.5 watts / cm2   Ultrasound Goals Pain   Manual Therapy   Manual Therapy Soft tissue mobilization;Myofascial release;Muscle Energy Technique   Manual therapy comments myofascial release and strain counter strain for increased tissue extensibility, decreased pain   Soft tissue mobilization deep tissue massage to ITB and distal posterior limb with added stretch in hip flexion   Muscle Energy Technique contract-relax to both anatoginist and agnonist to increase right hip flexion, extension, abduction & adduction    Prosthetics   Prosthetic Care Comments  PT spoke to prosthetist about his ill-fitting (due to changes in pt's limb since this prosthetic socket was casted / made) socket and probable relationship to his pain. Prosthetist and PT discussed options of socket designs with PT recommendation for new LIM or Innovations or atleast flexible inner socket with windows. Prosthetist says he will check into new socket. PT informed  /discussed conversation with patient. PT also arranged a peer visitation with another amputee using newer socket design to discuss pros & cons. PT did share with patient that one of prosthetist's concerns with newer designs is they have not been out long enough to know how they will hold up. PT verbalized understanding of all of above.    Current prosthetic wear tolerance (days/week)  daily   Current prosthetic wear tolerance (#hours/day)  all awake hours with 1/2 hour breaks as needed   Edema none   Residual limb condition   no open areas, callous distal posterior                  PT Short Term Goals - 05/19/15 0930    PT SHORT TERM GOAL #1   Title Patient tolerates wear of prosthesis daily for total time >50% of awake hours. (Target Date: 05/27/2015)   Baseline MET 05/19/2015   Time 1   Period Months   Status Achieved   PT SHORT TERM GOAL #2   Title Patient reports right limb pain </= 6/10 with 8 minutes of standing or gait activities.  (Target Date: 05/27/2015)   Baseline MET 05/19/2015  Pain 3-4/10 with 8 minutes of gait with single axillary crutch.    Time 1   Period Months   Status Achieved   PT SHORT TERM GOAL #3   Title Patient ambulates 400' with LRAD & prosthesis with pain increasing <3 increments on 0-10 scale.  (Target  Date: 05/27/2015)   Baseline MET 05/19/2015 Patient ambulates 1200' with single axillary crutch with pain increasing from 1-2/10 to 3-4/10.    Time 1   Period Months   Status Achieved   PT SHORT TERM GOAL #4   Title Patient demonstrates understanding of initial HEP.  (Target Date: 05/27/2015)   Baseline MET 05/19/2015 Patient verbalized understanding of initial HEP & previously demonstrated   Time 1   Period Months   Status Achieved           PT Long Term Goals - 05/19/15 1015    PT LONG TERM GOAL #1   Title Patient tolerates wear of prosthesis daily for >80% of awake hours with limb pain <2/10 sitting or within 5 minutes of sitting down.  (Target Date: 06/25/2015)   Time 2   Period Months   Status On-going   PT LONG TERM GOAL #2   Title Patient reports right limb pain increases < 3 increments on 0-10 scale with standing or gait >10 minutes.  (Target Date: 06/25/2015)   Time 2   Period Months   Status On-going   PT LONG TERM GOAL #3   Title Patient verbalizes / demonstrates understanding of ongoing fitness plan / HEP.  (Target Date: 06/25/2015)   Time 2   Period Months   Status On-going   PT LONG TERM GOAL #4   Title patient ambulates 1500' with prosthesis &  LRAD modified independent to enable community mobility.  (Target Date: 06/25/2015)   Time 2   Period Months   Status Revised   PT LONG TERM GOAL #5   Title Berg Balance >40/56 to reduce fall risk.  (Target Date: 06/25/2015)   Time 2   Period Months   Status On-going               Plan - 05/31/15 0930    Clinical Impression Statement Patient verbalizes understanding of recommendation for socket revision with consideration to new designs to decrease pressure on intolerant, painful areas. Patient's pain has decreased with PT care but without socket revision will probably increase again. Patient improved ability to descend stairs using hydraulics with instruction.    Pt will benefit from skilled therapeutic intervention in order to improve on the following deficits Abnormal gait;Decreased activity tolerance;Decreased balance;Decreased endurance;Decreased mobility;Decreased range of motion;Decreased strength;Postural dysfunction;Prosthetic Dependency;Pain   Rehab Potential Good   PT Frequency 2x / week   PT Duration Other (comment)  9 weeks (60 days)   PT Treatment/Interventions ADLs/Self Care Home Management;Electrical Stimulation;Moist Heat;Ultrasound;DME Instruction;Gait training;Stair training;Functional mobility training;Therapeutic activities;Therapeutic exercise;Balance training;Neuromuscular re-education;Patient/family education;Prosthetic Training;Compression bandaging;Manual techniques;Dry needling   PT Next Visit Plan standing & gait. US/e-stim & manual technique to right residual limb, prosthetic gait, work on descending stairs using hydraulics   Consulted and Agree with Plan of Care Patient        Problem List Patient Active Problem List   Diagnosis Date Noted  . Lactic acidosis 03/10/2015  . Pain in limb-Right Leg 10/13/2013  . Syphilis 10/09/2013  . Abnormality of gait 08/14/2012  . Atherosclerosis of native artery of extremity with intermittent claudication ([place])  04/08/2012  . Occlusion and stenosis of carotid artery without mention of cerebral infarction 12/11/2011  . Phantom limb syndrome (right lower limb) 11/23/2011  . Health care maintenance 11/23/2011  . Hepatitis B core antibody positive 11/23/2011  . AIDS with cachexia ([place]) 11/02/2010  . MRSA colonization 09/21/2010  . Furuncle 09/21/2010  . Status post above knee amputation ([place]) 08/02/2009  .  DM type 2 with diabetic peripheral neuropathy ([place]) 04/05/2009  . DYSPEPSIA 02/24/2008  . Essential (primary) hypertension 02/13/2008  . Hyperlipidemia associated with type 2 diabetes mellitus ([place]) 07/29/2007  . Human immunodeficiency virus (HIV) disease ([place]) 02/12/2006  . COLONIC POLYPS, ADENOMATOUS 02/12/2006  . Multiple sclerosis ([place]) 02/12/2006  . PERIPHERAL VASCULAR DISEASE 02/12/2006  . GERD 02/12/2006  . HEPATITIS B, HX OF (Pt is unaware of dx but states it remains in his chart no prev or current tx as of 11/2011) 02/12/2006    Annette Liotta PT, DPT 05/31/2015, 1:28 PM  [facility] [address] [place], [place], [address] Phone: [phone]   Fax:  [phone]  Name: MICAEL BARB MRN: [id number] Date of Birth: 23-Jan-1951

## 2015-05-31 NOTE — Telephone Encounter (Signed)
Patient called and advised he is having painful and frequent urination. He denies any discharge and wants to come and be tested for UTI. Advised him will let the doctor know and see if we can just add him for urinalysis and go from there. The patient is fine with that. Advised him will ask the doctor and give him a call back.

## 2015-06-01 DIAGNOSIS — R300 Dysuria: Secondary | ICD-10-CM | POA: Diagnosis not present

## 2015-06-01 LAB — URINALYSIS, ROUTINE W REFLEX MICROSCOPIC
Bilirubin Urine: NEGATIVE
KETONES UR: NEGATIVE
NITRITE: NEGATIVE
SPECIFIC GRAVITY, URINE: 1.037 — AB (ref 1.001–1.035)
pH: 5 (ref 5.0–8.0)

## 2015-06-01 LAB — URINE CYTOLOGY ANCILLARY ONLY
CHLAMYDIA, DNA PROBE: NEGATIVE
NEISSERIA GONORRHEA: NEGATIVE

## 2015-06-01 LAB — URINALYSIS, MICROSCOPIC ONLY
CASTS: NONE SEEN [LPF]
Crystals: NONE SEEN [HPF]
Squamous Epithelial / LPF: NONE SEEN [HPF] (ref <=5)
WBC, UA: >60 WBC/HPF — AB (ref <=5)
YEAST: NONE SEEN [HPF]

## 2015-06-01 LAB — RPR

## 2015-06-01 MED ORDER — OMEPRAZOLE 40 MG PO CPDR: 40.0000 mg | DELAYED_RELEASE_CAPSULE | Freq: Every day | ORAL | Status: DC

## 2015-06-02 ENCOUNTER — Ambulatory Visit: Payer: Medicare Other | Admitting: Physical Therapy

## 2015-06-02 ENCOUNTER — Encounter: Payer: Self-pay | Admitting: Physical Therapy

## 2015-06-02 DIAGNOSIS — M79609 Pain in unspecified limb: Principal | ICD-10-CM

## 2015-06-02 DIAGNOSIS — R269 Unspecified abnormalities of gait and mobility: Secondary | ICD-10-CM

## 2015-06-02 DIAGNOSIS — W19XXXS Unspecified fall, sequela: Secondary | ICD-10-CM

## 2015-06-02 DIAGNOSIS — R2689 Other abnormalities of gait and mobility: Secondary | ICD-10-CM

## 2015-06-02 DIAGNOSIS — T8789 Other complications of amputation stump: Secondary | ICD-10-CM

## 2015-06-02 DIAGNOSIS — R6889 Other general symptoms and signs: Secondary | ICD-10-CM

## 2015-06-02 DIAGNOSIS — R29818 Other symptoms and signs involving the nervous system: Secondary | ICD-10-CM | POA: Diagnosis not present

## 2015-06-02 DIAGNOSIS — G8918 Other acute postprocedural pain: Secondary | ICD-10-CM | POA: Diagnosis not present

## 2015-06-02 DIAGNOSIS — R2681 Unsteadiness on feet: Secondary | ICD-10-CM

## 2015-06-03 ENCOUNTER — Telehealth: Payer: Self-pay | Admitting: *Deleted

## 2015-06-03 ENCOUNTER — Other Ambulatory Visit: Payer: Self-pay | Admitting: *Deleted

## 2015-06-03 MED ORDER — CEPHALEXIN 500 MG PO CAPS: 500.0000 mg | ORAL_CAPSULE | Freq: Two times a day (BID) | ORAL | Status: DC

## 2015-06-03 NOTE — Telephone Encounter (Signed)
Patient called and notified. Nathan [name]

## 2015-06-03 NOTE — Telephone Encounter (Signed)
Culture does not appear to have been done but he has pyuria so I'd go ahead and give him keflex 500mg  twice daily for 7 days

## 2015-06-03 NOTE — Telephone Encounter (Signed)
Left message for patient, sent prescription to pharmacy per verbal order. Please cosign. Thanks!

## 2015-06-03 NOTE — Telephone Encounter (Signed)
Patietn calling for lab resutls - please advise. Landis Gandy, RN

## 2015-06-03 NOTE — Therapy (Signed)
[facility] [address] [place], [place], [address] Phone: [phone]   Fax:  [phone]  Physical Therapy Treatment  Patient Details  Name: Nathan [name] MRN: [id number] Date of Birth: 1951/01/11 Referring Provider: Harold Barban, MD  Encounter Date: 06/02/2015      PT End of Session - 06/02/15 0853    Visit Number 12   Number of Visits 18   Date for PT Re-Evaluation 06/25/15   Authorization Type Medicare G-Code and progress report every 10 visits   PT Start Time 0850   PT Stop Time 0930   PT Time Calculation (min) 40 min   Activity Tolerance Patient limited by pain   Behavior During Therapy [facility] for tasks assessed/performed      Past Medical History  Diagnosis Date  . Diabetes mellitus   . HIV infection ([place])     undetectable viral load and CD4 ct 667 as of 11/2011  . Colon polyps     noted previous colonoscopy [facility]  . Hyperlipidemia   . Multiple sclerosis ([place])     in remission as of 11/2011 (diagnosed late 1980s)  . GERD (gastroesophageal reflux disease)   . Hypertension   . MRSA (methicillin resistant Staphylococcus aureus)   . 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 History of syphilis     noted [facility] records  . Asthma     per 2003 [facility] pulm records pfts   . DDD (degenerative disc disease)     cervical spine  . PVD (peripheral vascular disease) ([place])     Left Stent 07/02/2008  . Depression   . Pneumonia   . UTI (urinary tract infection)   . Clotting disorder ([place])   . Blood dyscrasia     HIV  . Fever     unknwon origin  . Hep C w/o coma, chronic ([place])   . Pain in limb-Right Leg 10/13/2013    Past Surgical History  Procedure Laterality Date  . Other surgical history      right lower ext AKA with prothesis   . Other surgical history      left left with stents (Dr. Seward Speck)  . Other surgical history      2003 colonoscopy 5 mm polyp transverse colon; (2)83m   polyps in rectum-hyperplastic  . Above knee leg amputation      Right Leg  . Lower extremity angiogram Left 12/26/2011    Procedure: LOWER EXTREMITY ANGIOGRAM;  Surgeon: VSerafina Mitchell MD;  [facility];  Service: Cardiovascular;  Laterality: Left;    There were no vitals filed for this visit.  Visit Diagnosis:  Painful amputation stump (HCC)  Decreased functional activity tolerance  Abnormality of gait  Unsteadiness  Balance problems  Falls, sequela      Subjective Assessment - 06/02/15 0851    Subjective No new complaints. No falls. Walking most all the time with no device now. Worst pain in last 24 hours 3/10.   Currently in Pain? Yes   Pain Score 2    Pain Location Leg   Pain Orientation Right   Pain Descriptors / Indicators Cramping;Squeezing   Pain Type Chronic pain   Pain Onset More than a month ago   Pain Frequency Constant   Aggravating Factors  increased walking   Pain Relieving Factors sitting down, removing prosthesis           [facility] Adult PT Treatment/Exercise - 06/02/15 00347  Transfers   Sit to Stand 6: Modified independent (Device/Increase time);With upper extremity assist;From chair/3-in-1   Stand to Sit 6: Modified independent (Device/Increase time);With upper extremity assist;To chair/3-in-1   Ambulation/Gait   Ambulation/Gait Yes   Ambulation/Gait Assistance 5: Supervision   Ambulation/Gait Assistance Details occasional cues on posture and prosthetic position (decrease abduction)                 Ambulation Distance (Feet) 100 Feet   Assistive device Prosthesis;None   Gait Pattern Step-through pattern;Decreased step length - left;Decreased stance time - right;Decreased stride length;Decreased hip/knee flexion - right;Decreased weight shift to right;Right circumduction;Right hip hike;Antalgic;Abducted- right   Ambulation Surface Level;Indoor   Stairs Yes   Stairs Assistance 5: Supervision   Stairs Assistance Details (indicate cue type  and reason) cues on weight shifting to fully engage knee hydraulics with descending stairs   Stair Management Technique Two rails;Step to pattern;Alternating pattern;Forwards   Number of Stairs 4  x 4 reps   Electrical Stimulation   Electrical Stimulation Location right residual  limb along ITB towards Siatic Nerve concurrent with ultrasound   Electrical Stimulation Action concurrent with ultrasound   Electrical Stimulation Parameters 75 volt   Electrical Stimulation Goals Pain   Ultrasound   Ultrasound Location right residual limb along ITB and distal-lateral & distal- posterior area   Ultrasound Parameters 1.5 w/cm2, 100%, 1 mhz x 8 minutes concurrent with e-stim   Ultrasound Goals Pain   Manual Therapy   Manual Therapy Soft tissue mobilization;Myofascial release;Muscle Energy Technique   Manual therapy comments myofascial release and strain counter strain for increased tissue extensibility, decreased pain   Soft tissue mobilization deep tissue massage to ITB and distal posterior limb with added stretch in hip flexion   Muscle Energy Technique contract-relax to both anatoginist and agnonist to increase right hip flexion, extension, abduction & adduction    Prosthetics   Current prosthetic wear tolerance (days/week)  daily   Current prosthetic wear tolerance (#hours/day)  all awake hours with 1/2 hour breaks as needed   Edema none   Residual limb condition  no open areas, callous distal posterior           PT Short Term Goals - 05/19/15 0930    PT SHORT TERM GOAL #1   Title Patient tolerates wear of prosthesis daily for total time >50% of awake hours. (Target Date: 05/27/2015)   Baseline MET 05/19/2015   Time 1   Period Months   Status Achieved   PT SHORT TERM GOAL #2   Title Patient reports right limb pain </= 6/10 with 8 minutes of standing or gait activities.  (Target Date: 05/27/2015)   Baseline MET 05/19/2015  Pain 3-4/10 with 8 minutes of gait with single axillary crutch.     Time 1   Period Months   Status Achieved   PT SHORT TERM GOAL #3   Title Patient ambulates 400' with LRAD & prosthesis with pain increasing <3 increments on 0-10 scale.  (Target Date: 05/27/2015)   Baseline MET 05/19/2015 Patient ambulates 1200' with single axillary crutch with pain increasing from 1-2/10 to 3-4/10.    Time 1   Period Months   Status Achieved   PT SHORT TERM GOAL #4   Title Patient demonstrates understanding of initial HEP.  (Target Date: 05/27/2015)   Baseline MET 05/19/2015 Patient verbalized understanding of initial HEP & previously demonstrated   Time 1   Period Months   Status Achieved  PT Long Term Goals - 05/19/15 1015    PT LONG TERM GOAL #1   Title Patient tolerates wear of prosthesis daily for >80% of awake hours with limb pain <2/10 sitting or within 5 minutes of sitting down.  (Target Date: 06/25/2015)   Time 2   Period Months   Status On-going   PT LONG TERM GOAL #2   Title Patient reports right limb pain increases < 3 increments on 0-10 scale with standing or gait >10 minutes.  (Target Date: 06/25/2015)   Time 2   Period Months   Status On-going   PT LONG TERM GOAL #3   Title Patient verbalizes / demonstrates understanding of ongoing fitness plan / HEP.  (Target Date: 06/25/2015)   Time 2   Period Months   Status On-going   PT LONG TERM GOAL #4   Title patient ambulates 1500' with prosthesis & LRAD modified independent to enable community mobility.  (Target Date: 06/25/2015)   Time 2   Period Months   Status Revised   PT LONG TERM GOAL #5   Title Berg Balance >40/56 to reduce fall risk.  (Target Date: 06/25/2015)   Time 2   Period Months   Status On-going           Plan - 06/02/15 9381    Clinical Impression Statement Pt's pain in improving with treatment and pt with improved stair negotiation using knee hydraulic's to descend. Pt making steady progress toward goals.   Pt will benefit from skilled therapeutic intervention in order  to improve on the following deficits Abnormal gait;Decreased activity tolerance;Decreased balance;Decreased endurance;Decreased mobility;Decreased range of motion;Decreased strength;Postural dysfunction;Prosthetic Dependency;Pain   Rehab Potential Good   PT Frequency 2x / week   PT Duration Other (comment)  9 weeks (60 days)   PT Treatment/Interventions ADLs/Self Care Home Management;Electrical Stimulation;Moist Heat;Ultrasound;DME Instruction;Gait training;Stair training;Functional mobility training;Therapeutic activities;Therapeutic exercise;Balance training;Neuromuscular re-education;Patient/family education;Prosthetic Training;Compression bandaging;Manual techniques;Dry needling   PT Next Visit Plan standing & gait. US/e-stim & manual technique to right residual limb, prosthetic gait, work on descending stairs using hydraulics   Consulted and Agree with Plan of Care Patient        Problem List Patient Active Problem List   Diagnosis Date Noted  . Lactic acidosis 03/10/2015  . Pain in limb-Right Leg 10/13/2013  . Syphilis 10/09/2013  . Abnormality of gait 08/14/2012  . Atherosclerosis of native artery of extremity with intermittent claudication ([place]) 04/08/2012  . Occlusion and stenosis of carotid artery without mention of cerebral infarction 12/11/2011  . Phantom limb syndrome (right lower limb) 11/23/2011  . Health care maintenance 11/23/2011  . Hepatitis B core antibody positive 11/23/2011  . AIDS with cachexia ([place]) 11/02/2010  . MRSA colonization 09/21/2010  . Furuncle 09/21/2010  . Status post above knee amputation ([place]) 08/02/2009  . DM type 2 with diabetic peripheral neuropathy ([place]) 04/05/2009  . DYSPEPSIA 02/24/2008  . Essential (primary) hypertension 02/13/2008  . Hyperlipidemia associated with type 2 diabetes mellitus ([place]) 07/29/2007  . Human immunodeficiency virus (HIV) disease ([place]) 02/12/2006  . COLONIC POLYPS, ADENOMATOUS 02/12/2006  . Multiple sclerosis ([place])  02/12/2006  . PERIPHERAL VASCULAR DISEASE 02/12/2006  . GERD 02/12/2006  . HEPATITIS B, HX OF (Pt is unaware of dx but states it remains in his chart no prev or current tx as of 11/2011) 02/12/2006    Willow Ora 06/03/2015, 10:11 AM   Willow Ora, PTA, [facility] [address], [place], [place] [address] [phone] 06/03/2015,  10:11 AM   Name: GLENDEN ROSSELL MRN: [id number] Date of Birth: Aug 20, 1950

## 2015-06-04 NOTE — Telephone Encounter (Signed)
Perfect

## 2015-06-07 ENCOUNTER — Encounter: Payer: Self-pay | Admitting: Physical Therapy

## 2015-06-07 ENCOUNTER — Ambulatory Visit: Payer: Medicare Other | Admitting: Physical Therapy

## 2015-06-07 DIAGNOSIS — R29818 Other symptoms and signs involving the nervous system: Secondary | ICD-10-CM | POA: Diagnosis not present

## 2015-06-07 DIAGNOSIS — R6889 Other general symptoms and signs: Secondary | ICD-10-CM | POA: Diagnosis not present

## 2015-06-07 DIAGNOSIS — M79609 Pain in unspecified limb: Principal | ICD-10-CM

## 2015-06-07 DIAGNOSIS — T8789 Other complications of amputation stump: Secondary | ICD-10-CM

## 2015-06-07 DIAGNOSIS — R2689 Other abnormalities of gait and mobility: Secondary | ICD-10-CM

## 2015-06-07 DIAGNOSIS — R269 Unspecified abnormalities of gait and mobility: Secondary | ICD-10-CM

## 2015-06-07 DIAGNOSIS — W19XXXS Unspecified fall, sequela: Secondary | ICD-10-CM

## 2015-06-07 DIAGNOSIS — R2681 Unsteadiness on feet: Secondary | ICD-10-CM

## 2015-06-07 DIAGNOSIS — G8918 Other acute postprocedural pain: Secondary | ICD-10-CM | POA: Diagnosis not present

## 2015-06-07 NOTE — Therapy (Signed)
[facility] [address] [place], [place], [address] Phone: [phone]   Fax:  [phone]  Physical Therapy Treatment  Patient Details  Name: Nathan [name] MRN: [id number] Date of Birth: 11-27-1950 Referring Provider: Harold Barban, MD  Encounter Date: 06/07/2015      PT End of Session - 06/07/15 0857    Visit Number 13   Number of Visits 18   Date for PT Re-Evaluation 06/25/15   Authorization Type Medicare G-Code and progress report every 10 visits   PT Start Time 0847   PT Stop Time 0926   PT Time Calculation (min) 39 min   Activity Tolerance Patient limited by pain   Behavior During Therapy [facility] for tasks assessed/performed      Past Medical History  Diagnosis Date  . Diabetes mellitus   . HIV infection ([place])     undetectable viral load and CD4 ct 667 as of 11/2011  . Colon polyps     noted previous colonoscopy [facility]  . Hyperlipidemia   . Multiple sclerosis ([place])     in remission as of 11/2011 (diagnosed late 1980s)  . GERD (gastroesophageal reflux disease)   . Hypertension   . MRSA (methicillin resistant Staphylococcus aureus)   . 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 History of syphilis     noted [facility] records  . Asthma     per 2003 [facility] pulm records pfts   . DDD (degenerative disc disease)     cervical spine  . PVD (peripheral vascular disease) ([place])     Left Stent 07/02/2008  . Depression   . Pneumonia   . UTI (urinary tract infection)   . Clotting disorder ([place])   . Blood dyscrasia     HIV  . Fever     unknwon origin  . Hep C w/o coma, chronic ([place])   . Pain in limb-Right Leg 10/13/2013    Past Surgical History  Procedure Laterality Date  . Other surgical history      right lower ext AKA with prothesis   . Other surgical history      left left with stents (Dr. Seward Speck)  . Other surgical history      2003 colonoscopy 5 mm polyp transverse colon; (2)39m   polyps in rectum-hyperplastic  . Above knee leg amputation      Right Leg  . Lower extremity angiogram Left 12/26/2011    Procedure: LOWER EXTREMITY ANGIOGRAM;  Surgeon: VSerafina Mitchell MD;  [facility];  Service: Cardiovascular;  Laterality: Left;    There were no vitals filed for this visit.  Visit Diagnosis:  Painful amputation stump (HCC)  Decreased functional activity tolerance  Abnormality of gait  Unsteadiness  Falls, sequela  Balance problems      Subjective Assessment - 06/07/15 0854    Subjective No new complaints. No falls. Walking most all the time with no device now. Worst pain in last 24 hours 5/10. "It hurt like it did when it first started/." Better today.   Patient Stated Goals He would like to use prosthesis daily and walk like he was prior to October   Currently in Pain? Yes   Pain Score 2    Pain Location Leg   Pain Orientation Right   Pain Descriptors / Indicators Aching;Squeezing;Cramping   Pain Type Chronic pain   Pain Onset More than a month ago   Pain Frequency Constant   Aggravating Factors  increased walking   Pain Relieving Factors sitting down, removing prosthesis           [facility] Adult PT Treatment/Exercise - 06/07/15 0858    Transfers   Sit to Stand 6: Modified independent (Device/Increase time);With upper extremity assist;From chair/3-in-1   Stand to Sit 6: Modified independent (Device/Increase time);With upper extremity assist;To chair/3-in-1   Ambulation/Gait   Ambulation/Gait Yes   Ambulation/Gait Assistance 5: Supervision   Ambulation/Gait Assistance Details cues for increased weight shifting to right, increased left step length and for increased right knee flexion with swing phase   Ambulation Distance (Feet) 350 Feet   Assistive device Prosthesis;None   Gait Pattern Step-through pattern;Decreased stance time - left;Decreased step length - right;Decreased stride length;Antalgic;Decreased weight shift to right;Right  circumduction;Decreased hip/knee flexion - right   Ambulation Surface Level;Indoor   Stairs Yes   Stairs Assistance 5: Supervision   Stairs Assistance Details (indicate cue type and reason) cues on weight shifting and on hand advancement along rails   Stair Management Technique Two rails;Step to pattern;Alternating pattern;Forwards   Number of Stairs 4  x 4 reps   Electrical Stimulation   Electrical Stimulation Location right residual  limb along ITB towards Siatic Nerve concurrent with ultrasound   Electrical Stimulation Action concurrent with ultrasound   Electrical Stimulation Parameters 75   Electrical Stimulation Goals Pain   Ultrasound   Ultrasound Location right residual limb distal-posterior and lateral to estim on IT band   Ultrasound Parameters 1.5 w/cm2, 1 mhz, 100% x 8 minutes concurrent with e-stim   Ultrasound Goals Pain   Manual Therapy   Manual Therapy Soft tissue mobilization;Myofascial release;Muscle Energy Technique   Manual therapy comments myofascial release and strain counter strain for increased tissue extensibility, decreased pain   Soft tissue mobilization deep tissue massage to ITB and distal posterior limb with added stretch in hip flexion   Myofascial Release to distal residual limb, along IT band and posterior limb to hamstrings   Muscle Energy Technique contract-relax to both anatoginist and agnonist to increase right hip flexion, extension, abduction & adduction    Prosthetics   Current prosthetic wear tolerance (days/week)  daily   Current prosthetic wear tolerance (#hours/day)  all awake hours with 1/2 hour breaks as needed   Edema none   Residual limb condition  no open areas, callous distal posterior           PT Short Term Goals - 05/19/15 0930    PT SHORT TERM GOAL #1   Title Patient tolerates wear of prosthesis daily for total time >50% of awake hours. (Target Date: 05/27/2015)   Baseline MET 05/19/2015   Time 1   Period Months   Status  Achieved   PT SHORT TERM GOAL #2   Title Patient reports right limb pain </= 6/10 with 8 minutes of standing or gait activities.  (Target Date: 05/27/2015)   Baseline MET 05/19/2015  Pain 3-4/10 with 8 minutes of gait with single axillary crutch.    Time 1   Period Months   Status Achieved   PT SHORT TERM GOAL #3   Title Patient ambulates 400' with LRAD & prosthesis with pain increasing <3 increments on 0-10 scale.  (Target Date: 05/27/2015)   Baseline MET 05/19/2015 Patient ambulates 1200' with single axillary crutch with pain increasing from 1-2/10 to 3-4/10.    Time 1   Period Months   Status Achieved   PT SHORT TERM GOAL #4   Title Patient demonstrates understanding of initial  HEP.  (Target Date: 05/27/2015)   Baseline MET 05/19/2015 Patient verbalized understanding of initial HEP & previously demonstrated   Time 1   Period Months   Status Achieved           PT Long Term Goals - 05/19/15 1015    PT LONG TERM GOAL #1   Title Patient tolerates wear of prosthesis daily for >80% of awake hours with limb pain <2/10 sitting or within 5 minutes of sitting down.  (Target Date: 06/25/2015)   Time 2   Period Months   Status On-going   PT LONG TERM GOAL #2   Title Patient reports right limb pain increases < 3 increments on 0-10 scale with standing or gait >10 minutes.  (Target Date: 06/25/2015)   Time 2   Period Months   Status On-going   PT LONG TERM GOAL #3   Title Patient verbalizes / demonstrates understanding of ongoing fitness plan / HEP.  (Target Date: 06/25/2015)   Time 2   Period Months   Status On-going   PT LONG TERM GOAL #4   Title patient ambulates 1500' with prosthesis & LRAD modified independent to enable community mobility.  (Target Date: 06/25/2015)   Time 2   Period Months   Status Revised   PT LONG TERM GOAL #5   Title Berg Balance >40/56 to reduce fall risk.  (Target Date: 06/25/2015)   Time 2   Period Months   Status On-going            Plan - 06/07/15  0857    Clinical Impression Statement Pt continues to report elevated pain levels in residual limb at times, relieved with ultrasound/estim/manual therapy treatments. Pt to call and set appointment with prosthetist to discuss socket revision options. Improved stair negotiation today with use of hydrualic knee, less cues needed. Pt making steady progress toward goals.                                                                 Pt will benefit from skilled therapeutic intervention in order to improve on the following deficits Abnormal gait;Decreased activity tolerance;Decreased balance;Decreased endurance;Decreased mobility;Decreased range of motion;Decreased strength;Postural dysfunction;Prosthetic Dependency;Pain   Rehab Potential Good   PT Frequency 2x / week   PT Duration Other (comment)  9 weeks (60 days)   PT Treatment/Interventions ADLs/Self Care Home Management;Electrical Stimulation;Moist Heat;Ultrasound;DME Instruction;Gait training;Stair training;Functional mobility training;Therapeutic activities;Therapeutic exercise;Balance training;Neuromuscular re-education;Patient/family education;Prosthetic Training;Compression bandaging;Manual techniques;Dry needling   PT Next Visit Plan standing balance, barriers (plan for ramp and curb next session without AD) & gait. US/e-stim & manual technique to right residual limb, prosthetic gait, work on descending stairs using hydraulics   Consulted and Agree with Plan of Care Patient      Problem List Patient Active Problem List   Diagnosis Date Noted  . Lactic acidosis 03/10/2015  . Pain in limb-Right Leg 10/13/2013  . Syphilis 10/09/2013  . Abnormality of gait 08/14/2012  . Atherosclerosis of native artery of extremity with intermittent claudication ([place]) 04/08/2012  . Occlusion and stenosis of carotid artery without mention of cerebral infarction 12/11/2011  . Phantom limb syndrome (right lower limb) 11/23/2011  . Health care maintenance  11/23/2011  . Hepatitis B core antibody positive 11/23/2011  . AIDS with cachexia (  [place]) 11/02/2010  . MRSA colonization 09/21/2010  . Furuncle 09/21/2010  . Status post above knee amputation ([place]) 08/02/2009  . DM type 2 with diabetic peripheral neuropathy ([place]) 04/05/2009  . DYSPEPSIA 02/24/2008  . Essential (primary) hypertension 02/13/2008  . Hyperlipidemia associated with type 2 diabetes mellitus ([place]) 07/29/2007  . Human immunodeficiency virus (HIV) disease ([place]) 02/12/2006  . COLONIC POLYPS, ADENOMATOUS 02/12/2006  . Multiple sclerosis ([place]) 02/12/2006  . PERIPHERAL VASCULAR DISEASE 02/12/2006  . GERD 02/12/2006  . HEPATITIS B, HX OF (Pt is unaware of dx but states it remains in his chart no prev or current tx as of 11/2011) 02/12/2006    Willow Ora 06/07/2015, 9:36 AM  Willow Ora, PTA, [facility] [address], [place], [place] [address] [phone] 06/07/2015, 9:37 AM   Name: Nathan [name] MRN: [id number] Date of Birth: 11-15-1950

## 2015-06-09 ENCOUNTER — Encounter: Payer: Self-pay | Admitting: Physical Therapy

## 2015-06-09 ENCOUNTER — Ambulatory Visit: Payer: Medicare Other | Attending: Surgery | Admitting: Physical Therapy

## 2015-06-09 DIAGNOSIS — M79609 Pain in unspecified limb: Secondary | ICD-10-CM

## 2015-06-09 DIAGNOSIS — R269 Unspecified abnormalities of gait and mobility: Secondary | ICD-10-CM | POA: Insufficient documentation

## 2015-06-09 DIAGNOSIS — G8918 Other acute postprocedural pain: Secondary | ICD-10-CM | POA: Insufficient documentation

## 2015-06-09 DIAGNOSIS — T8789 Other complications of amputation stump: Secondary | ICD-10-CM | POA: Insufficient documentation

## 2015-06-09 DIAGNOSIS — W19XXXS Unspecified fall, sequela: Secondary | ICD-10-CM | POA: Insufficient documentation

## 2015-06-09 DIAGNOSIS — R2681 Unsteadiness on feet: Secondary | ICD-10-CM | POA: Diagnosis not present

## 2015-06-09 DIAGNOSIS — Z89611 Acquired absence of right leg above knee: Secondary | ICD-10-CM | POA: Diagnosis not present

## 2015-06-09 DIAGNOSIS — R29818 Other symptoms and signs involving the nervous system: Secondary | ICD-10-CM | POA: Insufficient documentation

## 2015-06-09 DIAGNOSIS — R6889 Other general symptoms and signs: Secondary | ICD-10-CM | POA: Insufficient documentation

## 2015-06-09 DIAGNOSIS — R2689 Other abnormalities of gait and mobility: Secondary | ICD-10-CM

## 2015-06-10 NOTE — Therapy (Signed)
[facility] [address] [place], [place], [address] Phone: [phone]   Fax:  [phone]  Physical Therapy Treatment  Patient Details  Name: Nathan [name] MRN: [id number] Date of Birth: 1950-06-23 Referring Provider: Harold Barban, MD  Encounter Date: 06/09/2015      PT End of Session - 06/09/15 0858    Visit Number 14   Number of Visits 18   Date for PT Re-Evaluation 06/25/15   Authorization Type Medicare G-Code and progress report every 10 visits   PT Start Time 0848   PT Stop Time 0930   PT Time Calculation (min) 42 min   Activity Tolerance Patient limited by pain   Behavior During Therapy [facility] for tasks assessed/performed      Past Medical History  Diagnosis Date  . Diabetes mellitus   . HIV infection ([place])     undetectable viral load and CD4 ct 667 as of 11/2011  . Colon polyps     noted previous colonoscopy [facility]  . Hyperlipidemia   . Multiple sclerosis ([place])     in remission as of 11/2011 (diagnosed late 1980s)  . GERD (gastroesophageal reflux disease)   . Hypertension   . MRSA (methicillin resistant Staphylococcus aureus)   . 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[place])     2002  . History of syphilis     noted [facility] records  . Asthma     per 2003 [facility] pulm records pfts   . DDD (degenerative disc disease)     cervical spine  . PVD (peripheral vascular disease) ([place])     Left Stent 07/02/2008  . Depression   . Pneumonia   . UTI (urinary tract infection)   . Clotting disorder ([place])   . Blood dyscrasia     HIV  . Fever     unknwon origin  . Hep C w/o coma, chronic ([place])   . Pain in limb-Right Leg 10/13/2013    Past Surgical History  Procedure Laterality Date  . Other surgical history      right lower ext AKA with prothesis   . Other surgical history      left left with stents (Dr. Seward Speck)  . Other surgical history      2003 colonoscopy 5 mm polyp transverse colon; (2)61m   polyps in rectum-hyperplastic  . Above knee leg amputation      Right Leg  . Lower extremity angiogram Left 12/26/2011    Procedure: LOWER EXTREMITY ANGIOGRAM;  Surgeon: VSerafina Mitchell MD;  [facility];  Service: Cardiovascular;  Laterality: Left;    There were no vitals filed for this visit.  Visit Diagnosis:  Painful amputation stump (HCC)  Decreased functional activity tolerance  Abnormality of gait  Unsteadiness  Falls, sequela  Balance problems      Subjective Assessment - 06/09/15 0856    Subjective No new complaints. No falls. Walking most all the time with no device now. Worst pain in last 24 hours 2-3/10 at most, no more "bad pain" episodes since last session.    Currently in Pain? Yes   Pain Score 2    Pain Location Leg   Pain Orientation Right   Pain Descriptors / Indicators Aching;Squeezing;Cramping   Pain Type Chronic pain   Pain Onset More than a month ago   Pain Frequency Constant   Aggravating Factors  increased walking   Pain Relieving Factors sitting down, removing prosthesis  [facility] Adult PT Treatment/Exercise - 06/09/15 0859    Transfers   Sit to Stand 6: Modified independent (Device/Increase time);With upper extremity assist;From chair/3-in-1   Stand to Sit 6: Modified independent (Device/Increase time);With upper extremity assist;To chair/3-in-1   Ambulation/Gait   Ambulation/Gait Yes   Ambulation/Gait Assistance 5: Supervision   Ambulation/Gait Assistance Details cues for posture, increased weight shifting over prosthesis in stance and for equal step length with gait.   Ambulation Distance (Feet) 350 Feet   Assistive device Prosthesis;None   Gait Pattern Step-through pattern;Decreased stance time - left;Decreased step length - right;Decreased stride length;Antalgic;Decreased weight shift to right;Right circumduction;Decreased hip/knee flexion - right;Right hip hike   Ambulation Surface Level;Indoor   Stairs Yes   Stairs  Assistance 5: Supervision   Stairs Assistance Details (indicate cue type and reason) cues for weight shifting over prosthesis to engage hydrualic knee with descending stairs                           Stair Management Technique Two rails;Step to pattern;Alternating pattern;Forwards   Number of Stairs 4  x 3 reps   Ramp 5: Supervision   Ramp Details (indicate cue type and reason) cues on step lenght and hydrualic knee engagement   Curb 5: Supervision   Curb Details (indicate cue type and reason) cues on posture and stance position after stepping back up curb for balance.   Acupuncturist Location right residual  limb along ITB towards Siatic Nerve concurrent with ultrasound   Electrical Stimulation Action concurrent with ultrasound for pain management   Electrical Stimulation Parameters 75 volt   Electrical Stimulation Goals Pain   Ultrasound   Ultrasound Location right distal end of limb, posterior-lateral aspects and along callous    Ultrasound Parameters 1.5 w/cm2, 100%, 1 Mhz x 8 minutes concurrent with estim   Ultrasound Goals Pain   Manual Therapy   Manual Therapy Soft tissue mobilization;Myofascial release;Muscle Energy Technique   Manual therapy comments myofascial release and strain counter strain for increased tissue extensibility, decreased pain   Soft tissue mobilization deep tissue massage to ITB and distal posterior limb with added stretch in hip flexion   Myofascial Release to distal residual limb, along IT band and posterior limb to hamstrings   Muscle Energy Technique contract-relax to both anatoginist and agnonist to increase right hip flexion, extension, abduction & adduction    Prosthetics   Current prosthetic wear tolerance (days/week)  daily   Current prosthetic wear tolerance (#hours/day)  all awake hours with 1/2 hour breaks as needed   Edema none   Residual limb condition  no open areas, callous distal posterior              PT Short Term Goals - 05/19/15 0930    PT SHORT TERM GOAL #1   Title Patient tolerates wear of prosthesis daily for total time >50% of awake hours. (Target Date: 05/27/2015)   Baseline MET 05/19/2015   Time 1   Period Months   Status Achieved   PT SHORT TERM GOAL #2   Title Patient reports right limb pain </= 6/10 with 8 minutes of standing or gait activities.  (Target Date: 05/27/2015)   Baseline MET 05/19/2015  Pain 3-4/10 with 8 minutes of gait with single axillary crutch.    Time 1   Period Months   Status Achieved   PT SHORT TERM GOAL #3   Title Patient ambulates 400' with LRAD &  prosthesis with pain increasing <3 increments on 0-10 scale.  (Target Date: 05/27/2015)   Baseline MET 05/19/2015 Patient ambulates 1200' with single axillary crutch with pain increasing from 1-2/10 to 3-4/10.    Time 1   Period Months   Status Achieved   PT SHORT TERM GOAL #4   Title Patient demonstrates understanding of initial HEP.  (Target Date: 05/27/2015)   Baseline MET 05/19/2015 Patient verbalized understanding of initial HEP & previously demonstrated   Time 1   Period Months   Status Achieved           PT Long Term Goals - 05/19/15 1015    PT LONG TERM GOAL #1   Title Patient tolerates wear of prosthesis daily for >80% of awake hours with limb pain <2/10 sitting or within 5 minutes of sitting down.  (Target Date: 06/25/2015)   Time 2   Period Months   Status On-going   PT LONG TERM GOAL #2   Title Patient reports right limb pain increases < 3 increments on 0-10 scale with standing or gait >10 minutes.  (Target Date: 06/25/2015)   Time 2   Period Months   Status On-going   PT LONG TERM GOAL #3   Title Patient verbalizes / demonstrates understanding of ongoing fitness plan / HEP.  (Target Date: 06/25/2015)   Time 2   Period Months   Status On-going   PT LONG TERM GOAL #4   Title patient ambulates 1500' with prosthesis & LRAD modified independent to enable community mobility.  (Target Date:  06/25/2015)   Time 2   Period Months   Status Revised   PT LONG TERM GOAL #5   Title Berg Balance >40/56 to reduce fall risk.  (Target Date: 06/25/2015)   Time 2   Period Months   Status On-going            Plan - 06/09/15 2263    Clinical Impression Statement Pt 's pain appears to be decreasing in intensity over several days now, even with higher activity levels. Pt is to meet with prosthetist on the upcoming Monday to discuss socket revision options (he did not like the  LIM system). Pt is making steady progress toward goals.                                                                                                                           Pt will benefit from skilled therapeutic intervention in order to improve on the following deficits Abnormal gait;Decreased activity tolerance;Decreased balance;Decreased endurance;Decreased mobility;Decreased range of motion;Decreased strength;Postural dysfunction;Prosthetic Dependency;Pain   Rehab Potential Good   PT Frequency 2x / week   PT Duration Other (comment)  9 weeks (60 days)   PT Treatment/Interventions ADLs/Self Care Home Management;Electrical Stimulation;Moist Heat;Ultrasound;DME Instruction;Gait training;Stair training;Functional mobility training;Therapeutic activities;Therapeutic exercise;Balance training;Neuromuscular re-education;Patient/family education;Prosthetic Training;Compression bandaging;Manual techniques;Dry needling   PT Next Visit Plan standing balance, barriers (plan for ramp and curb next session without AD) & gait. US/e-stim &  manual technique to right residual limb, prosthetic gait, work on descending stairs using hydraulics   Consulted and Agree with Plan of Care Patient        Problem List Patient Active Problem List   Diagnosis Date Noted  . Lactic acidosis 03/10/2015  . Pain in limb-Right Leg 10/13/2013  . Syphilis 10/09/2013  . Abnormality of gait 08/14/2012  . Atherosclerosis of native artery of  extremity with intermittent claudication ([place]) 04/08/2012  . Occlusion and stenosis of carotid artery without mention of cerebral infarction 12/11/2011  . Phantom limb syndrome (right lower limb) 11/23/2011  . Health care maintenance 11/23/2011  . Hepatitis B core antibody positive 11/23/2011  . AIDS with cachexia ([place]) 11/02/2010  . MRSA colonization 09/21/2010  . Furuncle 09/21/2010  . Status post above knee amputation ([place]) 08/02/2009  . DM type 2 with diabetic peripheral neuropathy ([place]) 04/05/2009  . DYSPEPSIA 02/24/2008  . Essential (primary) hypertension 02/13/2008  . Hyperlipidemia associated with type 2 diabetes mellitus ([place]) 07/29/2007  . Human immunodeficiency virus (HIV) disease ([place]) 02/12/2006  . COLONIC POLYPS, ADENOMATOUS 02/12/2006  . Multiple sclerosis ([place]) 02/12/2006  . PERIPHERAL VASCULAR DISEASE 02/12/2006  . GERD 02/12/2006  . HEPATITIS B, HX OF (Pt is unaware of dx but states it remains in his chart no prev or current tx as of 11/2011) 02/12/2006    Willow Ora 06/10/2015, 8:32 AM  Willow Ora, PTA, [facility] [address], [place], [place] [phone] 06/10/2015, 8:32 AM   Name: ARMONIE STATEN MRN: [id number] Date of Birth: 1950/09/22

## 2015-06-14 ENCOUNTER — Encounter: Payer: Self-pay | Admitting: Physical Therapy

## 2015-06-14 ENCOUNTER — Ambulatory Visit: Payer: Medicare Other | Admitting: Physical Therapy

## 2015-06-14 DIAGNOSIS — R6889 Other general symptoms and signs: Secondary | ICD-10-CM | POA: Diagnosis not present

## 2015-06-14 DIAGNOSIS — R269 Unspecified abnormalities of gait and mobility: Secondary | ICD-10-CM

## 2015-06-14 DIAGNOSIS — T8789 Other complications of amputation stump: Secondary | ICD-10-CM | POA: Diagnosis not present

## 2015-06-14 DIAGNOSIS — R2681 Unsteadiness on feet: Secondary | ICD-10-CM

## 2015-06-14 DIAGNOSIS — W19XXXS Unspecified fall, sequela: Secondary | ICD-10-CM

## 2015-06-14 DIAGNOSIS — M79609 Pain in unspecified limb: Principal | ICD-10-CM

## 2015-06-14 DIAGNOSIS — Z89611 Acquired absence of right leg above knee: Secondary | ICD-10-CM

## 2015-06-14 DIAGNOSIS — R29818 Other symptoms and signs involving the nervous system: Secondary | ICD-10-CM | POA: Diagnosis not present

## 2015-06-14 DIAGNOSIS — R2689 Other abnormalities of gait and mobility: Secondary | ICD-10-CM

## 2015-06-14 DIAGNOSIS — G8918 Other acute postprocedural pain: Secondary | ICD-10-CM | POA: Diagnosis not present

## 2015-06-14 NOTE — Therapy (Signed)
[facility] [address] [place], [place], [address] Phone: [phone]   Fax:  [phone]  Physical Therapy Treatment  Patient Details  Name: Nathan [name] MRN: [id number] Date of Birth: Feb 08, 1951 Referring Provider: Harold Barban, MD  Encounter Date: 06/14/2015      PT End of Session - 06/14/15 0930    Visit Number 15   Number of Visits 18   Date for PT Re-Evaluation 06/25/15   Authorization Type Medicare G-Code and progress report every 10 visits   PT Start Time 0840   PT Stop Time 0918   PT Time Calculation (min) 38 min   Activity Tolerance Patient limited by pain   Behavior During Therapy [facility] for tasks assessed/performed      Past Medical History  Diagnosis Date  . Diabetes mellitus   . HIV infection ([place])     undetectable viral load and CD4 ct 667 as of 11/2011  . Colon polyps     noted previous colonoscopy [facility]  . Hyperlipidemia   . Multiple sclerosis ([place])     in remission as of 11/2011 (diagnosed late 1980s)  . GERD (gastroesophageal reflux disease)   . Hypertension   . MRSA (methicillin resistant Staphylococcus aureus)   . 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 History of syphilis     noted [facility] records  . Asthma     per 2003 [facility] pulm records pfts   . DDD (degenerative disc disease)     cervical spine  . PVD (peripheral vascular disease) ([place])     Left Stent 07/02/2008  . Depression   . Pneumonia   . UTI (urinary tract infection)   . Clotting disorder ([place])   . Blood dyscrasia     HIV  . Fever     unknwon origin  . Hep C w/o coma, chronic ([place])   . Pain in limb-Right Leg 10/13/2013    Past Surgical History  Procedure Laterality Date  . Other surgical history      right lower ext AKA with prothesis   . Other surgical history      left left with stents (Dr. Seward Speck)  . Other surgical history      2003 colonoscopy 5 mm polyp transverse colon; (2)18m   polyps in rectum-hyperplastic  . Above knee leg amputation      Right Leg  . Lower extremity angiogram Left 12/26/2011    Procedure: LOWER EXTREMITY ANGIOGRAM;  Surgeon: VSerafina Mitchell MD;  [facility];  Service: Cardiovascular;  Laterality: Left;    There were no vitals filed for this visit.  Visit Diagnosis:  Painful amputation stump (HCC)  Decreased functional activity tolerance  Abnormality of gait  Unsteadiness  Falls, sequela  Balance problems  Status post above knee amputation of right lower extremity ([facility]      Subjective Assessment - 06/14/15 0848    Subjective Has appt with prosthetist later today. wearing prosthesis all awake hours with no issues. Walking without device with mild needs to rest limb for pain. Canceled second appt this week due to death in the family.    Currently in Pain? Yes   Pain Score 2    Pain Location Leg   Pain Orientation Right   Pain Descriptors / Indicators Aching;Sore   Pain Type Chronic pain   Pain Onset More than a month ago   Pain Frequency Constant   Aggravating Factors  increased walking  Pain Relieving Factors sitting to rest   Multiple Pain Sites No                         [facility] Adult PT Treatment/Exercise - 06/14/15 7564    Transfers   Sit to Stand 6: Modified independent (Device/Increase time);With upper extremity assist;From chair/3-in-1   Stand to Sit 6: Modified independent (Device/Increase time);With upper extremity assist;To chair/3-in-1   Ambulation/Gait   Ambulation/Gait Yes   Ambulation/Gait Assistance 5: Supervision   Ambulation/Gait Assistance Details cues on flexing prosthetic knee in terminal stance to improve clearance in swing with less deviations.    Ambulation Distance (Feet) 100 Feet  100' X 2   Assistive device Prosthesis;None   Gait Pattern Step-through pattern;Decreased stance time - left;Decreased step length - right;Decreased stride length;Antalgic;Decreased weight  shift to right;Right circumduction;Decreased hip/knee flexion - right;Right hip hike   Ambulation Surface Indoor;Level   Stairs --   Stairs Assistance --   Stair Management Technique --   Number of Stairs --   Ramp --   Curb --   Acupuncturist Stimulation Location right residual  limb along ITB towards Siatic Nerve concurrent with ultrasound   Electrical Stimulation Action concurrent with [place] cross pattern for pain management   Electrical Stimulation Parameters 100 mamps   Electrical Stimulation Goals Pain   Ultrasound   Ultrasound Location right residual limb along ITB & distal limb laterally & posteriorly   Ultrasound Parameters 1.5 watts /cm2   Ultrasound Goals Pain   Manual Therapy   Manual Therapy Soft tissue mobilization;Myofascial release;Muscle Energy Technique   Manual therapy comments myofascial release and strain counter strain for increased tissue extensibility, decreased pain   Soft tissue mobilization deep tissue massage to ITB and distal posterior limb with added stretch in hip flexion   Myofascial Release to distal residual limb, along IT band and posterior limb to hamstrings   Muscle Energy Technique contract-relax to both anatoginist and agnonist to increase right hip flexion, extension, abduction & adduction    Prosthetics   Prosthetic Care Comments  PT instructed in socket revision option of flexible inner liner the length of socket (current only proximal 1/3 of socket) with option for windows for greater relief as indicated.    Current prosthetic wear tolerance (days/week)  daily   Current prosthetic wear tolerance (#hours/day)  all awake hours with 1/2 hour breaks as needed   Edema none   Residual limb condition  no open areas, callous distal posterior   Education Provided Other (comment)  See prosthetic care   Person(s) Educated Patient   Education Method Explanation   Education Method Verbalized understanding                   PT Short Term Goals - 05/19/15 0930    PT SHORT TERM GOAL #1   Title Patient tolerates wear of prosthesis daily for total time >50% of awake hours. (Target Date: 05/27/2015)   Baseline MET 05/19/2015   Time 1   Period Months   Status Achieved   PT SHORT TERM GOAL #2   Title Patient reports right limb pain </= 6/10 with 8 minutes of standing or gait activities.  (Target Date: 05/27/2015)   Baseline MET 05/19/2015  Pain 3-4/10 with 8 minutes of gait with single axillary crutch.    Time 1   Period Months   Status Achieved   PT SHORT TERM GOAL #3   Title Patient ambulates  400' with LRAD & prosthesis with pain increasing <3 increments on 0-10 scale.  (Target Date: 05/27/2015)   Baseline MET 05/19/2015 Patient ambulates 1200' with single axillary crutch with pain increasing from 1-2/10 to 3-4/10.    Time 1   Period Months   Status Achieved   PT SHORT TERM GOAL #4   Title Patient demonstrates understanding of initial HEP.  (Target Date: 05/27/2015)   Baseline MET 05/19/2015 Patient verbalized understanding of initial HEP & previously demonstrated   Time 1   Period Months   Status Achieved           PT Long Term Goals - 05/19/15 1015    PT LONG TERM GOAL #1   Title Patient tolerates wear of prosthesis daily for >80% of awake hours with limb pain <2/10 sitting or within 5 minutes of sitting down.  (Target Date: 06/25/2015)   Time 2   Period Months   Status On-going   PT LONG TERM GOAL #2   Title Patient reports right limb pain increases < 3 increments on 0-10 scale with standing or gait >10 minutes.  (Target Date: 06/25/2015)   Time 2   Period Months   Status On-going   PT LONG TERM GOAL #3   Title Patient verbalizes / demonstrates understanding of ongoing fitness plan / HEP.  (Target Date: 06/25/2015)   Time 2   Period Months   Status On-going   PT LONG TERM GOAL #4   Title patient ambulates 1500' with prosthesis & LRAD modified independent to enable community mobility.  (Target Date:  06/25/2015)   Time 2   Period Months   Status Revised   PT LONG TERM GOAL #5   Title Berg Balance >40/56 to reduce fall risk.  (Target Date: 06/25/2015)   Time 2   Period Months   Status On-going               Plan - 06/14/15 0930    Clinical Impression Statement Patient's pain has improved to enable wear of prosthesis all awake hours and increased gait in community without device. Patient appears on target to meet LTGs next week on time. He verbalized understanding of recommendation for flexible inner liner complete length of socket with potential to have "windows" in harder laminated portion for greater relief if needed. Pt to discuss with prosthetist.    Pt will benefit from skilled therapeutic intervention in order to improve on the following deficits Abnormal gait;Decreased activity tolerance;Decreased balance;Decreased endurance;Decreased mobility;Decreased range of motion;Decreased strength;Postural dysfunction;Prosthetic Dependency;Pain   Rehab Potential Good   PT Frequency 2x / week   PT Duration Other (comment)  9 weeks (60 days)   PT Treatment/Interventions ADLs/Self Care Home Management;Electrical Stimulation;Moist Heat;Ultrasound;DME Instruction;Gait training;Stair training;Functional mobility training;Therapeutic activities;Therapeutic exercise;Balance training;Neuromuscular re-education;Patient/family education;Prosthetic Training;Compression bandaging;Manual techniques;Dry needling   PT Next Visit Plan Begin to assess LTGs for discharge, standing balance, barriers (plan for ramp and curb next session without AD) & gait. US/e-stim & manual technique to right residual limb, prosthetic gait, work on descending stairs using hydraulics   Consulted and Agree with Plan of Care Patient        Problem List Patient Active Problem List   Diagnosis Date Noted  . Lactic acidosis 03/10/2015  . Pain in limb-Right Leg 10/13/2013  . Syphilis 10/09/2013  . Abnormality of gait  08/14/2012  . Atherosclerosis of native artery of extremity with intermittent claudication ([place]) 04/08/2012  . Occlusion and stenosis of carotid artery without mention of cerebral infarction 12/11/2011  .  Phantom limb syndrome (right lower limb) 11/23/2011  . Health care maintenance 11/23/2011  . Hepatitis B core antibody positive 11/23/2011  . AIDS with cachexia ([place]) 11/02/2010  . MRSA colonization 09/21/2010  . Furuncle 09/21/2010  . Status post above knee amputation ([place]) 08/02/2009  . DM type 2 with diabetic peripheral neuropathy ([place]) 04/05/2009  . DYSPEPSIA 02/24/2008  . Essential (primary) hypertension 02/13/2008  . Hyperlipidemia associated with type 2 diabetes mellitus ([place]) 07/29/2007  . Human immunodeficiency virus (HIV) disease ([place]) 02/12/2006  . COLONIC POLYPS, ADENOMATOUS 02/12/2006  . Multiple sclerosis ([place]) 02/12/2006  . PERIPHERAL VASCULAR DISEASE 02/12/2006  . GERD 02/12/2006  . HEPATITIS B, HX OF (Pt is unaware of dx but states it remains in his chart no prev or current tx as of 11/2011) 02/12/2006    Lexee Brashears PT, DPT 06/14/2015, 11:28 AM  [facility] [address] [place], [place], [address] Phone: [phone]   Fax:  [phone]  Name: Nathan [name] MRN: [id number] Date of Birth: 09-12-1950

## 2015-06-16 ENCOUNTER — Encounter: Payer: Self-pay | Admitting: Physical Therapy

## 2015-06-21 ENCOUNTER — Encounter: Payer: Self-pay | Admitting: Physical Therapy

## 2015-06-21 ENCOUNTER — Ambulatory Visit: Payer: Medicare Other | Admitting: Physical Therapy

## 2015-06-21 DIAGNOSIS — R269 Unspecified abnormalities of gait and mobility: Secondary | ICD-10-CM

## 2015-06-21 DIAGNOSIS — R2689 Other abnormalities of gait and mobility: Secondary | ICD-10-CM

## 2015-06-21 DIAGNOSIS — T8789 Other complications of amputation stump: Secondary | ICD-10-CM

## 2015-06-21 DIAGNOSIS — R2681 Unsteadiness on feet: Secondary | ICD-10-CM | POA: Diagnosis not present

## 2015-06-21 DIAGNOSIS — G8918 Other acute postprocedural pain: Secondary | ICD-10-CM | POA: Diagnosis not present

## 2015-06-21 DIAGNOSIS — R6889 Other general symptoms and signs: Secondary | ICD-10-CM

## 2015-06-21 DIAGNOSIS — M79609 Pain in unspecified limb: Secondary | ICD-10-CM

## 2015-06-21 DIAGNOSIS — R29818 Other symptoms and signs involving the nervous system: Secondary | ICD-10-CM | POA: Diagnosis not present

## 2015-06-21 DIAGNOSIS — W19XXXS Unspecified fall, sequela: Secondary | ICD-10-CM

## 2015-06-22 NOTE — Therapy (Signed)
[facility] [address] [facility] [place], [place], [address] Phone: [phone]   Fax:  [phone]  Physical Therapy Treatment  Patient Details  Name: Nathan [name] MRN: [id number] Date of Birth: 11-02-1950 Referring Provider: Harold Barban, MD  Encounter Date: 06/21/2015      PT End of Session - 06/21/15 0853    Visit Number 16   Number of Visits 18   Date for PT Re-Evaluation 06/25/15   Authorization Type Medicare G-Code and progress report every 10 visits   PT Start Time 0845   PT Stop Time 0927   PT Time Calculation (min) 42 min   Activity Tolerance Patient limited by pain   Behavior During Therapy [facility] for tasks assessed/performed      Past Medical History  Diagnosis Date  . Diabetes mellitus   . HIV infection ([place])     undetectable viral load and CD4 ct 667 as of 11/2011  . Colon polyps     noted previous colonoscopy [facility]  . Hyperlipidemia   . Multiple sclerosis ([place])     in remission as of 11/2011 (diagnosed late 1980s)  . GERD (gastroesophageal reflux disease)   . Hypertension   . MRSA (methicillin resistant Staphylococcus aureus)   . 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[place])     2002  . History of syphilis     noted [facility] records  . Asthma     per 2003 [facility] pulm records pfts   . DDD (degenerative disc disease)     cervical spine  . PVD (peripheral vascular disease) ([place])     Left Stent 07/02/2008  . Depression   . Pneumonia   . UTI (urinary tract infection)   . Clotting disorder ([place])   . Blood dyscrasia     HIV  . Fever     unknwon origin  . Hep C w/o coma, chronic ([place])   . Pain in limb-Right Leg 10/13/2013    Past Surgical History  Procedure Laterality Date  . Other surgical history      right lower ext AKA with prothesis   . Other surgical history      left left with stents (Dr. Seward Speck)  . Other surgical history      2003 colonoscopy 5 mm polyp transverse colon; (2)81m   polyps in rectum-hyperplastic  . Above knee leg amputation      Right Leg  . Lower extremity angiogram Left 12/26/2011    Procedure: LOWER EXTREMITY ANGIOGRAM;  Surgeon: VSerafina Mitchell MD;  [facility];  Service: Cardiovascular;  Laterality: Left;    There were no vitals filed for this visit.  Visit Diagnosis:  Decreased functional activity tolerance  Abnormality of gait  Unsteadiness  Falls, sequela  Balance problems  Painful amputation stump (HCC)      Subjective Assessment - 06/21/15 0850    Subjective Saw prosthetist who does not feel he needs a socket revision, that the socket is not the cause of his limb pain. States the prosthetist feels it's the blood clot causing his limb pain. worst pain in past 24 hours 2/10, however it was 5/10 on Saturday evening after walking a lot on Saturday (no AD used while walking [place]). No falls.                                  Currently in Pain? Yes   Pain Score  2    Pain Location Leg   Pain Orientation Right   Pain Descriptors / Indicators Aching;Sore   Pain Type Chronic pain   Pain Onset More than a month ago   Pain Frequency Constant   Aggravating Factors  increased walking   Pain Relieving Factors sitting to rest             [facility] Adult PT Treatment/Exercise - 06/21/15 0926    Ambulation/Gait   Ambulation/Gait Yes   Ambulation/Gait Assistance 6: Modified independent (Device/Increase time);5: Supervision   Ambulation/Gait Assistance Details supervision only due to cues to correct gait deviations   Ambulation Distance (Feet) 1500 Feet  or more   Assistive device Prosthesis;None   Gait Pattern Step-through pattern;Decreased stance time - left;Decreased step length - right;Decreased stride length;Antalgic;Decreased weight shift to right;Right circumduction;Decreased hip/knee flexion - right;Right hip hike   Ambulation Surface Level;Unlevel;Indoor;Outdoor;Paved   Financial planner Location right residual  limb along ITB towards Siatic Nerve concurrent with ultrasound   Electrical Stimulation Action concurrent with ultrasound for pain reduction   Electrical Stimulation Parameters 80 volt   Electrical Stimulation Goals Pain   Ultrasound   Ultrasound Location right residual limb distal-posterior and lateral   Ultrasound Parameters 1.5 w/cm2, 100%, 1 mhz x 8 minutes concurrent with estim   Ultrasound Goals Pain   Manual Therapy   Manual Therapy Soft tissue mobilization;Myofascial release;Muscle Energy Technique   Manual therapy comments myofascial release and strain counter strain for increased tissue extensibility, decreased pain   Myofascial Release to distal residual limb, along IT band and posterior limb to hamstrings   Muscle Energy Technique contract-relax to both anatoginist and agnonist to increase right hip flexion, extension, abduction & adduction              PT Short Term Goals - 05/19/15 0930    PT SHORT TERM GOAL #1   Title Patient tolerates wear of prosthesis daily for total time >50% of awake hours. (Target Date: 05/27/2015)   Baseline MET 05/19/2015   Time 1   Period Months   Status Achieved   PT SHORT TERM GOAL #2   Title Patient reports right limb pain </= 6/10 with 8 minutes of standing or gait activities.  (Target Date: 05/27/2015)   Baseline MET 05/19/2015  Pain 3-4/10 with 8 minutes of gait with single axillary crutch.    Time 1   Period Months   Status Achieved   PT SHORT TERM GOAL #3   Title Patient ambulates 400' with LRAD & prosthesis with pain increasing <3 increments on 0-10 scale.  (Target Date: 05/27/2015)   Baseline MET 05/19/2015 Patient ambulates 1200' with single axillary crutch with pain increasing from 1-2/10 to 3-4/10.    Time 1   Period Months   Status Achieved   PT SHORT TERM GOAL #4   Title Patient demonstrates understanding of initial HEP.  (Target Date: 05/27/2015)   Baseline MET 05/19/2015 Patient  verbalized understanding of initial HEP & previously demonstrated   Time 1   Period Months   Status Achieved           PT Long Term Goals - 06/21/15 0932    PT LONG TERM GOAL #1   Title Patient tolerates wear of prosthesis daily for >80% of awake hours with limb pain <2/10 sitting or within 5 minutes of sitting down.  (Target Date: 06/25/2015)   Time --   Period --   Status Partially Met   PT  LONG TERM GOAL #2   Title Patient reports right limb pain increases < 3 increments on 0-10 scale with standing or gait >10 minutes.  (Target Date: 06/25/2015)   Baseline 06/21/15: pt's pain increased from 2/10 to 4-5/10 wth gait today.   Time --   Period --   Status Achieved   PT LONG TERM GOAL #3   Title Patient verbalizes / demonstrates understanding of ongoing fitness plan / HEP.  (Target Date: 06/25/2015)   Time 2   Period Months   Status On-going   PT LONG TERM GOAL #4   Title patient ambulates 1500' with prosthesis & LRAD modified independent to enable community mobility.  (Target Date: 06/25/2015)   Baseline 06/21/15: met today   Time --   Period --   Status Achieved   PT LONG TERM GOAL #5   Title Berg Balance >40/56 to reduce fall risk.  (Target Date: 06/25/2015)   Time 2   Period Months   Status On-going        06/21/15 0853  Plan  Clinical Impression Statement Pt has met some LTGs and is progressing toward others. Still reports increased pain with increased activity/gait for increased distances.  Pt will benefit from skilled therapeutic intervention in order to improve on the following deficits Abnormal gait;Decreased activity tolerance;Decreased balance;Decreased endurance;Decreased mobility;Decreased range of motion;Decreased strength;Postural dysfunction;Prosthetic Dependency;Pain  Rehab Potential Good  PT Frequency 2x / week  PT Duration Other (comment) (9 weeks (60 days))  PT Treatment/Interventions ADLs/Self Care Home Management;Electrical Stimulation;Moist  Heat;Ultrasound;DME Instruction;Gait training;Stair training;Functional mobility training;Therapeutic activities;Therapeutic exercise;Balance training;Neuromuscular re-education;Patient/family education;Prosthetic Training;Compression bandaging;Manual techniques;Dry needling  PT Next Visit Plan assess remaining LTGs.  Consulted and Agree with Plan of Care Patient      Problem List Patient Active Problem List   Diagnosis Date Noted  . Lactic acidosis 03/10/2015  . Pain in limb-Right Leg 10/13/2013  . Syphilis 10/09/2013  . Abnormality of gait 08/14/2012  . Atherosclerosis of native artery of extremity with intermittent claudication ([place]) 04/08/2012  . Occlusion and stenosis of carotid artery without mention of cerebral infarction 12/11/2011  . Phantom limb syndrome (right lower limb) 11/23/2011  . Health care maintenance 11/23/2011  . Hepatitis B core antibody positive 11/23/2011  . AIDS with cachexia ([place]) 11/02/2010  . MRSA colonization 09/21/2010  . Furuncle 09/21/2010  . Status post above knee amputation ([place]) 08/02/2009  . DM type 2 with diabetic peripheral neuropathy ([place]) 04/05/2009  . DYSPEPSIA 02/24/2008  . Essential (primary) hypertension 02/13/2008  . Hyperlipidemia associated with type 2 diabetes mellitus ([place]) 07/29/2007  . Human immunodeficiency virus (HIV) disease ([place]) 02/12/2006  . COLONIC POLYPS, ADENOMATOUS 02/12/2006  . Multiple sclerosis ([place]) 02/12/2006  . PERIPHERAL VASCULAR DISEASE 02/12/2006  . GERD 02/12/2006  . HEPATITIS B, HX OF (Pt is unaware of dx but states it remains in his chart no prev or current tx as of 11/2011) 02/12/2006    Willow Ora 06/22/2015, 1:39 PM  Willow Ora, PTA, [place] [address], [place], [place] [address] [phone] 06/22/2015, 1:39 PM   Name: Nathan [name] MRN: [id number] Date of Birth: 12-Aug-1950

## 2015-06-23 ENCOUNTER — Encounter: Payer: Self-pay | Admitting: Physical Therapy

## 2015-06-23 ENCOUNTER — Ambulatory Visit: Payer: Medicare Other | Admitting: Physical Therapy

## 2015-06-23 DIAGNOSIS — W19XXXS Unspecified fall, sequela: Secondary | ICD-10-CM

## 2015-06-23 DIAGNOSIS — Z89611 Acquired absence of right leg above knee: Secondary | ICD-10-CM

## 2015-06-23 DIAGNOSIS — R2689 Other abnormalities of gait and mobility: Secondary | ICD-10-CM

## 2015-06-23 DIAGNOSIS — M79609 Pain in unspecified limb: Secondary | ICD-10-CM

## 2015-06-23 DIAGNOSIS — R2681 Unsteadiness on feet: Secondary | ICD-10-CM

## 2015-06-23 DIAGNOSIS — T8789 Other complications of amputation stump: Secondary | ICD-10-CM | POA: Diagnosis not present

## 2015-06-23 DIAGNOSIS — R6889 Other general symptoms and signs: Secondary | ICD-10-CM

## 2015-06-23 DIAGNOSIS — R269 Unspecified abnormalities of gait and mobility: Secondary | ICD-10-CM | POA: Diagnosis not present

## 2015-06-23 DIAGNOSIS — G8918 Other acute postprocedural pain: Secondary | ICD-10-CM | POA: Diagnosis not present

## 2015-06-23 DIAGNOSIS — R29818 Other symptoms and signs involving the nervous system: Secondary | ICD-10-CM | POA: Diagnosis not present

## 2015-06-24 NOTE — Therapy (Signed)
[facility] [address] [place], [place], [address] Phone: [phone]   Fax:  [phone]  Physical Therapy Treatment  Patient Details  Name: CALI CUARTAS MRN: [id number] Date of Birth: 08-Oct-1950 Referring Provider: Harold Barban, MD  PHYSICAL THERAPY DISCHARGE SUMMARY  Visits from Start of Care: 17  Current functional level related to goals / functional outcomes: See below   Remaining deficits: Patient still ambulates with gait deviations of hip hiking due to limiting knee flexion on prosthesis.    Education / Equipment: HEP & fall prevention strategies Plan: Patient agrees to discharge.  Patient goals were met. Patient is being discharged due to meeting the stated rehab goals.  ?????        Encounter Date: 06/23/2015      PT End of Session - 06/23/15 1015    Visit Number 17   Number of Visits 18   Date for PT Re-Evaluation 06/25/15   Authorization Type Medicare G-Code and progress report every 10 visits   PT Start Time 0930   PT Stop Time 1008   PT Time Calculation (min) 38 min   Activity Tolerance Patient limited by pain   Behavior During Therapy [facility] for tasks assessed/performed      Past Medical History  Diagnosis Date  . Diabetes mellitus   . HIV infection ([place])     undetectable viral load and CD4 ct 667 as of 11/2011  . Colon polyps     noted previous colonoscopy [facility]  . Hyperlipidemia   . Multiple sclerosis ([place])     in remission as of 11/2011 (diagnosed late 1980s)  . GERD (gastroesophageal reflux disease)   . Hypertension   . MRSA (methicillin resistant Staphylococcus aureus)   . 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[place])     2002  . History of syphilis     noted [facility] records  . Asthma     per 2003 [facility] pulm records pfts   . DDD (degenerative disc disease)     cervical spine  . PVD (peripheral vascular disease) ([place])     Left Stent 07/02/2008  . Depression   . Pneumonia    . UTI (urinary tract infection)   . Clotting disorder ([place])   . Blood dyscrasia     HIV  . Fever     unknwon origin  . Hep C w/o coma, chronic ([place])   . Pain in limb-Right Leg 10/13/2013    Past Surgical History  Procedure Laterality Date  . Other surgical history      right lower ext AKA with prothesis   . Other surgical history      left left with stents (Dr. Seward Speck)  . Other surgical history      2003 colonoscopy 5 mm polyp transverse colon; (2)4m  polyps in rectum-hyperplastic  . Above knee leg amputation      Right Leg  . Lower extremity angiogram Left 12/26/2011    Procedure: LOWER EXTREMITY ANGIOGRAM;  Surgeon: VSerafina Mitchell MD;  [facility];  Service: Cardiovascular;  Laterality: Left;    There were no vitals filed for this visit.  Visit Diagnosis:  Decreased functional activity tolerance  Abnormality of gait  Unsteadiness  Falls, sequela  Balance problems  Painful amputation stump (HCC)  Status post above knee amputation of right lower extremity ([facility]      Subjective Assessment - 06/23/15 0944    Subjective The prosthetist does not think he can justify a new socket.  Currently in Pain? Yes   Pain Score 1    Pain Location Leg   Pain Orientation Right   Pain Descriptors / Indicators Aching;Sore   Pain Type Chronic pain   Pain Onset More than a month ago   Pain Frequency Constant   Aggravating Factors  increased walking   Pain Relieving Factors sitting to rest            [facility] PT Assessment - 06/23/15 0930    Berg Balance Test   Sit to Stand Able to stand  independently using hands   Standing Unsupported Able to stand safely 2 minutes   Sitting with Back Unsupported but Feet Supported on Floor or Stool Able to sit safely and securely 2 minutes   Stand to Sit Sits safely with minimal use of hands   Transfers Able to transfer safely, minor use of hands   Standing Unsupported with Eyes Closed Able to stand 10 seconds safely    Standing Ubsupported with Feet Together Able to place feet together independently and stand 1 minute safely   From Standing, Reach Forward with Outstretched Arm Can reach confidently >25 cm (10")   From Standing Position, Pick up Object from Floor Able to pick up shoe safely and easily   From Standing Position, Turn to Look Behind Over each Shoulder Looks behind from both sides and weight shifts well   Turn 360 Degrees Able to turn 360 degrees safely one side only in 4 seconds or less   Standing Unsupported, Alternately Place Feet on Step/Stool Able to complete 4 steps without aid or supervision   Standing Unsupported, One Foot in Front Able to take small step independently and hold 30 seconds   Standing on One Leg Able to lift leg independently and hold > 10 seconds   Total Score 50         Prosthetics Assessment - 06/23/15 0930    Prosthetics   Prosthetic Care Independent with Skin check;Residual limb care;Prosthetic cleaning;Correct ply sock adjustment;Proper wear schedule/adjustment   Current prosthetic wear tolerance (days/week)  daily   Current prosthetic wear tolerance (#hours/day)  all awake hours   Edema none   Residual limb condition  no open areas.                     [place] [facility] Adult PT Treatment/Exercise - 06/23/15 0930    Ambulation/Gait   Ambulation/Gait Yes   Ambulation/Gait Assistance 6: Modified independent (Device/Increase time);5: Supervision   Ambulation Distance (Feet) 1590 Feet   Assistive device Prosthesis;None   Ambulation Surface Indoor;Level;Outdoor;Unlevel;Paved;Gravel;Grass   Stairs Yes   Stairs Assistance 6: Modified independent (Device/Increase time)   Stair Management Technique Two rails;Alternating pattern;Forwards   Number of Stairs 5   Ramp 6: Modified independent (Device)  prosthesis only   Curb 6: Modified independent (Device/increase time)  prosthesis only   Acupuncturist Location --   Electrical  Stimulation Goals --   Ultrasound   Ultrasound Goals --   Manual Therapy   Manual Therapy --   Manual therapy comments --   Myofascial Release --   Muscle Energy Technique --                PT Education - 06/23/15 0930    Education provided Yes   Education Details if pain returns to contact prosthetist &/or physician at onset & not to wait until unbearable.    Person(s) Educated Patient   Methods Explanation   Comprehension Verbalized  understanding          PT Short Term Goals - 05/19/15 0930    PT SHORT TERM GOAL #1   Title Patient tolerates wear of prosthesis daily for total time >50% of awake hours. (Target Date: 05/27/2015)   Baseline MET 05/19/2015   Time 1   Period Months   Status Achieved   PT SHORT TERM GOAL #2   Title Patient reports right limb pain </= 6/10 with 8 minutes of standing or gait activities.  (Target Date: 05/27/2015)   Baseline MET 05/19/2015  Pain 3-4/10 with 8 minutes of gait with single axillary crutch.    Time 1   Period Months   Status Achieved   PT SHORT TERM GOAL #3   Title Patient ambulates 400' with LRAD & prosthesis with pain increasing <3 increments on 0-10 scale.  (Target Date: 05/27/2015)   Baseline MET 05/19/2015 Patient ambulates 1200' with single axillary crutch with pain increasing from 1-2/10 to 3-4/10.    Time 1   Period Months   Status Achieved   PT SHORT TERM GOAL #4   Title Patient demonstrates understanding of initial HEP.  (Target Date: 05/27/2015)   Baseline MET 05/19/2015 Patient verbalized understanding of initial HEP & previously demonstrated   Time 1   Period Months   Status Achieved           PT Long Term Goals - 06/23/15 0946    PT LONG TERM GOAL #1   Title Patient tolerates wear of prosthesis daily for >80% of awake hours with limb pain <2/10 sitting or within 5 minutes of sitting down.  (Target Date: 06/25/2015)   Baseline MET 06/22/2105 He reports wear all of awake hours except showering with limb pain  1/10.    Status Achieved   PT LONG TERM GOAL #2   Title Patient reports right limb pain increases < 3 increments on 0-10 scale with standing or gait >10 minutes.  (Target Date: 06/25/2015)   Baseline MET 06/23/2015 Pt ambulates 1550' with pain increasing from 1/10 to 2/10 for 10 minutes.    Status Achieved   PT LONG TERM GOAL #3   Title Patient verbalizes / demonstrates understanding of ongoing fitness plan / HEP.  (Target Date: 06/25/2015)   Baseline MET 06/23/2015   Time 2   Period Months   Status Achieved   PT LONG TERM GOAL #4   Title patient ambulates 1500' with prosthesis & LRAD modified independent to enable community mobility.  (Target Date: 06/25/2015)   Baseline MET 06/23/2015 Pt ambulates 1550' with pain increasing from 1/10 to 2/10 for 10 minutes.    Status Achieved   PT LONG TERM GOAL #5   Title Berg Balance >40/56 to reduce fall risk.  (Target Date: 06/25/2015)   Baseline MET 06/23/2015 Berg Balance 50/56 from 33 /56 initially.    Time 2   Period Months   Status Achieved               Plan - 06/23/15 1015    Clinical Impression Statement Patient met all LTGs. He verbalized understanding of recommendations if pain returns to see prosthetist &/or physician at onset before it becomes unbearable & he removes the prosthesis. Patient reports he was able to return to prior level of function and understands better how to use some of microprocessor functions. Patient still has vascular stent present in residual limb and pressure from prosthesis may be a factor that caused this pain issue. Also he switched to a  microprocessor knee which is heavier than old knee and increased weight with skin traction in swing especially with gait deviations could be factor in limb pain.     Pt will benefit from skilled therapeutic intervention in order to improve on the following deficits Abnormal gait;Decreased activity tolerance;Decreased balance;Decreased endurance;Decreased mobility;Decreased range  of motion;Decreased strength;Postural dysfunction;Prosthetic Dependency;Pain   Rehab Potential Good   PT Frequency 2x / week   PT Duration Other (comment)  9 weeks (60 days)   PT Treatment/Interventions ADLs/Self Care Home Management;Electrical Stimulation;Moist Heat;Ultrasound;DME Instruction;Gait training;Stair training;Functional mobility training;Therapeutic activities;Therapeutic exercise;Balance training;Neuromuscular re-education;Patient/family education;Prosthetic Training;Compression bandaging;Manual techniques;Dry needling   PT Next Visit Plan discharge   Consulted and Agree with Plan of Care Patient          G-Codes - 07-23-15 1015    Functional Assessment Tool Used Pain in limb 1/10. Gait 20mn with prosthesis only. Pain increased to 2/10.    Functional Limitation Other PT primary   Other PT Primary Goal Status (([id number] At least 1 percent but less than 20 percent impaired, limited or restricted   Other PT Primary Discharge Status [phone] At least 1 percent but less than 20 percent impaired, limited or restricted      Problem List Patient Active Problem List   Diagnosis Date Noted  . Lactic acidosis 03/10/2015  . Pain in limb-Right Leg 10/13/2013  . Syphilis 10/09/2013  . Abnormality of gait 08/14/2012  . Atherosclerosis of native artery of extremity with intermittent claudication ([place] 04/08/2012  . Occlusion and stenosis of carotid artery without mention of cerebral infarction 12/11/2011  . Phantom limb syndrome (right lower limb) 11/23/2011  . Health care maintenance 11/23/2011  . Hepatitis B core antibody positive 11/23/2011  . AIDS with cachexia ([place] 11/02/2010  . MRSA colonization 09/21/2010  . Furuncle 09/21/2010  . Status post above knee amputation ([place] 08/02/2009  . DM type 2 with diabetic peripheral neuropathy ([place] 04/05/2009  . DYSPEPSIA 02/24/2008  . Essential (primary) hypertension 02/13/2008  . Hyperlipidemia associated with type 2 diabetes mellitus  ([place] 07/29/2007  . Human immunodeficiency virus (HIV) disease ([place] 02/12/2006  . COLONIC POLYPS, ADENOMATOUS 02/12/2006  . Multiple sclerosis ([place] 02/12/2006  . PERIPHERAL VASCULAR DISEASE 02/12/2006  . GERD 02/12/2006  . HEPATITIS B, HX OF (Pt is unaware of dx but states it remains in his chart no prev or current tx as of 11/2011) 02/12/2006    Adebayo Ensminger PT, DPT 06/24/2015, 12:35 PM  [address] [address]Phone: [phone]  Fax:  [phone] Name: BSAHITH NURSEMRN: [id number] of Birth: [birth year]

## 2015-07-01 ENCOUNTER — Other Ambulatory Visit: Payer: Self-pay | Admitting: *Deleted

## 2015-07-01 DIAGNOSIS — M79661 Pain in right lower leg: Secondary | ICD-10-CM

## 2015-07-01 MED ORDER — OXYCODONE HCL 10 MG PO TABS: 10.0000 mg | ORAL_TABLET | Freq: Three times a day (TID) | ORAL | Status: DC | PRN

## 2015-07-01 MED ORDER — PREGABALIN 100 MG PO CAPS: 100.0000 mg | ORAL_CAPSULE | Freq: Three times a day (TID) | ORAL | Status: DC

## 2015-07-06 ENCOUNTER — Encounter: Payer: Self-pay | Admitting: Surgery

## 2015-07-08 ENCOUNTER — Ambulatory Visit (INDEPENDENT_AMBULATORY_CARE_PROVIDER_SITE_OTHER): Payer: Medicare Other | Admitting: Internal Medicine

## 2015-07-08 VITALS — BP 119/55 | HR 73 | Temp 97.6°F | Wt 172.8 lb

## 2015-07-08 DIAGNOSIS — Z794 Long term (current) use of insulin: Secondary | ICD-10-CM | POA: Diagnosis not present

## 2015-07-08 DIAGNOSIS — E1142 Type 2 diabetes mellitus with diabetic polyneuropathy: Secondary | ICD-10-CM | POA: Diagnosis not present

## 2015-07-08 DIAGNOSIS — Z89611 Acquired absence of right leg above knee: Secondary | ICD-10-CM

## 2015-07-08 DIAGNOSIS — G546 Phantom limb syndrome with pain: Secondary | ICD-10-CM | POA: Diagnosis not present

## 2015-07-08 DIAGNOSIS — E1165 Type 2 diabetes mellitus with hyperglycemia: Secondary | ICD-10-CM | POA: Diagnosis not present

## 2015-07-08 LAB — POCT GLYCOSYLATED HEMOGLOBIN (HGB A1C): Hemoglobin A1C: 12

## 2015-07-08 LAB — GLUCOSE, CAPILLARY: Glucose-Capillary: 399 mg/dL — ABNORMAL HIGH (ref 65–99)

## 2015-07-08 MED ORDER — INSULIN GLARGINE 100 UNIT/ML SOLOSTAR PEN: 84.0000 [IU] | PEN_INJECTOR | Freq: Every day | SUBCUTANEOUS | Status: DC

## 2015-07-08 MED ORDER — INSULIN LISPRO 100 UNIT/ML (KWIKPEN): 33.0000 [IU] | PEN_INJECTOR | Freq: Three times a day (TID) | SUBCUTANEOUS | Status: DC

## 2015-07-08 NOTE — Progress Notes (Signed)
Patient ID: KOSISOCHUKWU BURNINGHAM, male   DOB: May 28, 1950, 65 y.o.   MRN: [id number]   Subjective:   Patient ID: KELIJAH TOWRY male   DOB: [birth year] 65 y.o.   MRN: [id number]  HPI: Mr.Stancil D Gary is a 65 y.o. male with past medical history detailed below presenting for follow up of DM.  Please see assessment and plan for issues addressed today.    Past Medical History  Diagnosis Date  . Diabetes mellitus   . HIV infection ([place])     undetectable viral load and CD4 ct 667 as of 11/2011  . Colon polyps     noted previous colonoscopy [facility]  . Hyperlipidemia   . Multiple sclerosis ([place])     in remission as of 11/2011 (diagnosed late 1980s)  . GERD (gastroesophageal reflux disease)   . Hypertension   . MRSA (methicillin resistant Staphylococcus aureus)   . 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[place])     2002  . History of syphilis     noted [facility] records  . Asthma     per 2003 [facility] pulm records pfts   . DDD (degenerative disc disease)     cervical spine  . PVD (peripheral vascular disease) ([place])     Left Stent 07/02/2008  . Depression   . Pneumonia   . UTI (urinary tract infection)   . Clotting disorder ([place])   . Blood dyscrasia     HIV  . Fever     unknwon origin  . Hep C w/o coma, chronic ([place])   . Pain in limb-Right Leg 10/13/2013   Current Outpatient Prescriptions  Medication Sig Dispense Refill  . atorvastatin (LIPITOR) 40 MG tablet TAKE 1 TABLET BY MOUTH DAILY 90 tablet 2  . BD PEN NEEDLE NANO U/F 32G X 4 MM MISC USE AS DIRECTED FOUR TIMES DAILY 100 each 0  . Blood Glucose Monitoring Suppl (BAYER CONTOUR MONITOR) W/DEVICE KIT Use to check blood sugar as instructed up to 4 times a day 1 kit 0  . cephALEXin (KEFLEX) 500 MG capsule Take 1 capsule (500 mg total) by mouth 2 (two) times daily. 14 capsule 0  . clopidogrel (PLAVIX) 75 MG tablet Take 1 tablet (75 mg total) by mouth daily. 90 tablet 3  . emtricitabine-tenofovir AF (DESCOVY) 200-25 MG tablet Take 1 tablet by mouth  daily. 30 tablet 11  . glucose blood (BAYER CONTOUR NEXT TEST) test strip Four times daily before meals and at bedtime, insulin dependent, ICD-10 CODE E11.42 120 each 11  . ibuprofen (ADVIL,MOTRIN) 200 MG tablet Take 400-600 mg by mouth every 6 (six) hours as needed.    . Insulin Glargine (LANTUS SOLOSTAR) 100 UNIT/ML Solostar Pen Inject 84 Units into the skin daily at 10 pm. 15 mL 11  . insulin lispro (HUMALOG KWIKPEN) 100 UNIT/ML KiwkPen Inject 0.33 mLs (33 Units total) into the skin 3 (three) times daily with meals. 15 mL 11  . Insulin Pen Needle (PEN NEEDLES 3/16") 31G X 5 MM MISC 1 application by Does not apply route 4 (four) times daily. Use to inject insulin twice daily Dx code 250.00 120 each 11  . Lancet Devices (BAYER MICROLET 2 LANCING DEVIC) MISC Use to check blood sugar up to 3 ties a day 1 each 2  . Lancets MISC Use to Test Blood Sugar Three Times Daily. Dx Code: 250.02 100 each 3  . lisinopril (PRINIVIL,ZESTRIL) 40 MG tablet Take 1 tablet (40 mg total) by mouth daily. 90 tablet 3  .  omeprazole (PRILOSEC) 40 MG capsule Take 1 capsule (40 mg total) by mouth daily. 90 capsule 1  . Oxycodone HCl 10 MG TABS Take 1 tablet (10 mg total) by mouth 3 (three) times daily as needed. 90 tablet 0  . pregabalin (LYRICA) 100 MG capsule Take 1 capsule (100 mg total) by mouth 3 (three) times daily. 90 capsule 2  . raltegravir (ISENTRESS) 400 MG tablet Take 1 tablet (400 mg total) by mouth 2 (two) times daily. 60 tablet 11  . zidovudine (RETROVIR) 300 MG tablet Take 1 tablet (300 mg total) by mouth 2 (two) times daily. 60 tablet 11   No current facility-administered medications for this visit.   Family History  Problem Relation Age of Onset  . Diabetes Mother   . Coronary artery disease Mother   . Cancer Brother     colon caner stage 4 as of 11/2011 (unknown age of onset)  . Prostate cancer Brother   . Coronary artery disease Father    Social History   Social History  . Marital Status:  Single    Spouse Name: N/A  . Number of Children: N/A  . Years of Education: N/A   Social History Main Topics  . Smoking status: Former Smoker    Quit date: 05/09/2007  . Smokeless tobacco: Never Used  . Alcohol Use: No  . Drug Use: No  . Sexual Activity: Not on file   Other Topics Concern  . Not on file   Social History Narrative   Review of Systems: Review of Systems  Constitutional: Negative for fever and chills.  Eyes: Negative for blurred vision.  Cardiovascular: Negative for chest pain.  Genitourinary: Positive for frequency.  Neurological: Negative for dizziness.  Endo/Heme/Allergies: Positive for polydipsia.     Objective:  Physical Exam: Filed Vitals:   07/08/15 1449  BP: 119/55  Pulse: 73  Temp: 97.6 F (36.4 C)  TempSrc: Oral  Weight: 172 lb 12.8 oz (78.382 kg)  SpO2: 96%   Physical Exam  Constitutional: He is oriented to person, place, and time. He appears well-developed and well-nourished.  Ambulates with limb due to prosthesis  HENT:  Lipodystrophy present  Eyes: EOM are normal.  Cardiovascular: Normal rate and regular rhythm.   Pulmonary/Chest: Effort normal and breath sounds normal.  Neurological: He is alert and oriented to person, place, and time.  Psychiatric: He has a normal mood and affect.     Assessment & Plan:   Please see problem list for assessment and plan.  Case discussed with Dr. Dareen Piano.

## 2015-07-08 NOTE — Patient Instructions (Signed)
Thank you for coming to see me today. It was a pleasure. Today we talked about:   Your diabetes: Please take Lantus 84 units at night time and Humalog 33 units three times a day.  Please check your blood sugars 4 times a day until follow up with [place].  Please follow-up with a provider in our clinic in 2 weeks for diabetes follow up.  If you have any questions or concerns, please do not hesitate to call the office at [phone].  Take Care,   Jule Ser, DO

## 2015-07-08 NOTE — Assessment & Plan Note (Addendum)
Assessment: A1c today has deteriorated from 9.2 in October 2016 to 12.0 today.  He has been off metformin since that time secondary to side effect of lactic acidosis.  At last visit, his Lantus was increased to 80 units daily and Humalog to 30 units TID.  He has been taking 76 units Lantus and the 30units of Humalog, however, he reports frequently missing doses of the mid-day Humalog.  States he's been better about this in the past couple weeks though.  States he is not having any blurry vision but is experiencing symptoms of increased thirst and urination.  His meter was brought today and showed readings as high as 500.  Plan: - increase Lantus and Humalog by about 10% --> 84 units Lantus, 33 units Humalog TID - check CBG QID - follow up in 2 weeks and adjust insulin if needed - consider adding an SGLT-2 inhibitor or GLP-1 agonist - referrral today back to Ms. [name] Plyler  - consider sending to endocrinology if unable to get better glucose control - DME order for diabetic shoes

## 2015-07-08 NOTE — Assessment & Plan Note (Signed)
Assessment/Plan: - status post AKA right leg with phantom limb pain needing DME order for prosthetic supplies (Socks, liners) - will put in order

## 2015-07-12 ENCOUNTER — Other Ambulatory Visit: Payer: Self-pay | Admitting: *Deleted

## 2015-07-12 ENCOUNTER — Ambulatory Visit (INDEPENDENT_AMBULATORY_CARE_PROVIDER_SITE_OTHER): Payer: Medicare Other | Admitting: Surgery

## 2015-07-12 ENCOUNTER — Ambulatory Visit (INDEPENDENT_AMBULATORY_CARE_PROVIDER_SITE_OTHER)
Admission: RE | Admit: 2015-07-12 | Discharge: 2015-07-12 | Disposition: A | Discharge status: Home / Self Care | Payer: Medicare Other | Source: Ambulatory Visit | Attending: Surgery | Admitting: Surgery

## 2015-07-12 ENCOUNTER — Ambulatory Visit (HOSPITAL_COMMUNITY)
Admission: RE | Admit: 2015-07-12 | Discharge: 2015-07-12 | Disposition: A | Discharge status: Home / Self Care | Payer: Medicare Other | Source: Ambulatory Visit | Attending: Surgery | Admitting: Surgery

## 2015-07-12 VITALS — BP 114/60 | HR 62 | Temp 97.1°F | Resp 14 | Wt 171.0 lb

## 2015-07-12 DIAGNOSIS — E119 Type 2 diabetes mellitus without complications: Secondary | ICD-10-CM | POA: Insufficient documentation

## 2015-07-12 DIAGNOSIS — R0989 Other specified symptoms and signs involving the circulatory and respiratory systems: Secondary | ICD-10-CM | POA: Diagnosis present

## 2015-07-12 DIAGNOSIS — I739 Peripheral vascular disease, unspecified: Secondary | ICD-10-CM | POA: Diagnosis not present

## 2015-07-12 DIAGNOSIS — I10 Essential (primary) hypertension: Secondary | ICD-10-CM | POA: Insufficient documentation

## 2015-07-12 DIAGNOSIS — B20 Human immunodeficiency virus [HIV] disease: Secondary | ICD-10-CM | POA: Insufficient documentation

## 2015-07-12 DIAGNOSIS — M79661 Pain in right lower leg: Secondary | ICD-10-CM | POA: Diagnosis not present

## 2015-07-12 DIAGNOSIS — E785 Hyperlipidemia, unspecified: Secondary | ICD-10-CM | POA: Diagnosis not present

## 2015-07-12 DIAGNOSIS — I6523 Occlusion and stenosis of bilateral carotid arteries: Secondary | ICD-10-CM

## 2015-07-12 NOTE — Progress Notes (Signed)
Internal Medicine Clinic Attending  Case discussed with Dr. Wallace at the time of the visit.  We reviewed the resident's history and exam and pertinent patient test results.  I agree with the assessment, diagnosis, and plan of care documented in the resident's note.  

## 2015-07-12 NOTE — Progress Notes (Signed)
Patient name: Nathan [name] MRN: [id number] DOB: [birth year] Sex: male     Chief Complaint  Patient presents with  . PVD    pt still has cramping in LLE but it is not any worse.     HISTORY OF PRESENT ILLNESS:  The patient is back for follow-up.The patient comes back today for followup. He is status post angiography in August of 2013. He has a history of left leg stenting. A 7 x 40 Cordis stent was placed on February of 2010 for left leg claudication. He has single vessel runoff via the anterior tibial artery  .  I saw him 3 months ago when he was having pain in his right above-knee amputation [place]. After having reviewed his vascular lab studies and prior angiograms, I did not take that there was anything acute from a vascular perspective.  He was sent for physical therapy.  He states that his leg is not perfect but much better.  Past Medical History  Diagnosis Date  . Diabetes mellitus   . HIV infection ([employer])     undetectable viral load and CD4 ct 667 as of 11/2011  . Colon polyps     noted previous colonoscopy [facility]  . Hyperlipidemia   . Multiple sclerosis ([place])     in remission as of 11/2011 (diagnosed late 1980s)  . GERD (gastroesophageal reflux disease)   . Hypertension   . MRSA (methicillin resistant Staphylococcus aureus)   . 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[place])     2002  . History of syphilis     noted [facility] records  . Asthma     per 2003 [facility] pulm records pfts   . DDD (degenerative disc disease)     cervical spine  . PVD (peripheral vascular disease) ([place])     Left Stent 07/02/2008  . Depression   . Pneumonia   . UTI (urinary tract infection)   . Clotting disorder ([place])   . Blood dyscrasia     HIV  . Fever     unknwon origin  . Hep C w/o coma, chronic ([place])   . Pain in limb-Right Leg 10/13/2013    Past Surgical History  Procedure Laterality Date  . Other surgical history      right lower ext AKA with prothesis   . Other surgical history     left left with stents (Dr. Seward Speck)  . Other surgical history      2003 colonoscopy 5 mm polyp transverse colon; (2)4m  polyps in rectum-hyperplastic  . Above knee leg amputation      Right Leg  . Lower extremity angiogram Left 12/26/2011    Procedure: LOWER EXTREMITY ANGIOGRAM;  Surgeon: VSerafina Mitchell MD;  [facility];  Service: Cardiovascular;  Laterality: Left;    Social History   Social History  . Marital Status: Single    Spouse Name: N/A  . Number of Children: N/A  . Years of Education: N/A   Occupational History  . Not on file.   Social History Main Topics  . Smoking status: Former Smoker    Quit date: 05/09/2007  . Smokeless tobacco: Never Used  . Alcohol Use: No  . Drug Use: No  . Sexual Activity: Not on file   Other Topics Concern  . Not on file   Social History Narrative    Family History  Problem Relation Age of Onset  . Diabetes Mother   . Coronary artery disease Mother   .  Cancer Brother     colon caner stage 4 as of 11/2011 (unknown age of onset)  . Prostate cancer Brother   . Coronary artery disease Father     Allergies as of 07/12/2015 - Review Complete 07/12/2015  Allergen Reaction Noted  . Sulfonamide derivatives Hives 02/12/2006    Current Outpatient Prescriptions on File Prior to Visit  Medication Sig Dispense Refill  . atorvastatin (LIPITOR) 40 MG tablet TAKE 1 TABLET BY MOUTH DAILY 90 tablet 2  . BD PEN NEEDLE NANO U/F 32G X 4 MM MISC USE AS DIRECTED FOUR TIMES DAILY 100 each 0  . Blood Glucose Monitoring Suppl (BAYER CONTOUR MONITOR) W/DEVICE KIT Use to check blood sugar as instructed up to 4 times a day 1 kit 0  . cephALEXin (KEFLEX) 500 MG capsule Take 1 capsule (500 mg total) by mouth 2 (two) times daily. 14 capsule 0  . clopidogrel (PLAVIX) 75 MG tablet Take 1 tablet (75 mg total) by mouth daily. 90 tablet 3  . emtricitabine-tenofovir AF (DESCOVY) 200-25 MG tablet Take 1 tablet by mouth daily. 30 tablet 11  .  glucose blood (BAYER CONTOUR NEXT TEST) test strip Four times daily before meals and at bedtime, insulin dependent, ICD-10 CODE E11.42 120 each 11  . ibuprofen (ADVIL,MOTRIN) 200 MG tablet Take 400-600 mg by mouth every 6 (six) hours as needed.    . Insulin Glargine (LANTUS SOLOSTAR) 100 UNIT/ML Solostar Pen Inject 84 Units into the skin daily at 10 pm. 15 mL 11  . insulin lispro (HUMALOG KWIKPEN) 100 UNIT/ML KiwkPen Inject 0.33 mLs (33 Units total) into the skin 3 (three) times daily with meals. 15 mL 11  . Insulin Pen Needle (PEN NEEDLES 3/16") 31G X 5 MM MISC 1 application by Does not apply route 4 (four) times daily. Use to inject insulin twice daily Dx code 250.00 120 each 11  . Lancet Devices (BAYER MICROLET 2 LANCING DEVIC) MISC Use to check blood sugar up to 3 ties a day 1 each 2  . Lancets MISC Use to Test Blood Sugar Three Times Daily. Dx Code: 250.02 100 each 3  . lisinopril (PRINIVIL,ZESTRIL) 40 MG tablet Take 1 tablet (40 mg total) by mouth daily. 90 tablet 3  . omeprazole (PRILOSEC) 40 MG capsule Take 1 capsule (40 mg total) by mouth daily. 90 capsule 1  . Oxycodone HCl 10 MG TABS Take 1 tablet (10 mg total) by mouth 3 (three) times daily as needed. 90 tablet 0  . pregabalin (LYRICA) 100 MG capsule Take 1 capsule (100 mg total) by mouth 3 (three) times daily. 90 capsule 2  . raltegravir (ISENTRESS) 400 MG tablet Take 1 tablet (400 mg total) by mouth 2 (two) times daily. 60 tablet 11  . zidovudine (RETROVIR) 300 MG tablet Take 1 tablet (300 mg total) by mouth 2 (two) times daily. 60 tablet 11   No current facility-administered medications on file prior to visit.     REVIEW OF SYSTEMS:  see history of present illness was negative  PHYSICAL EXAMINATION:   Vital signs are  Filed Vitals:   07/12/15 1156  BP: 114/60  Pulse: 62  Temp: 97.1 F (36.2 C)  TempSrc: Oral  Resp: 14  Weight: 171 lb (77.565 kg)  SpO2: 96%   Body mass index is 22.57 kg/(m^2). General: The patient  appears their stated age. HEENT:  No gross abnormalities Pulmonary:  Non labored breathing Musculoskeletal:  Right above-knee amputation. Neurologic: No focal weakness or paresthesias are detected, Skin:  There are no ulcer or rashes noted. Psychiatric: The patient has normal affect. Cardiovascular:  Nonpalpable pedal pulse on the left  Diagnostic Studies  I have ordered and reviewed his vascular lab studies.  ABIs 0.88 on the left with triphasic waveforms.  There is no evidence of in-stent stenosis  Assessment:  atherosclerosis Plan:  the patient continues to do very well. The left SFA stent remains widely patent. I have scheduled him for follow-up in one year with a duplex of his left leg and a carotid duplex to follow up on his carotid stenosis which is asymptomatic  V. Leia Alf, M.D. [facility] Office: [phone] Pager:  [phone]

## 2015-07-22 ENCOUNTER — Ambulatory Visit (INDEPENDENT_AMBULATORY_CARE_PROVIDER_SITE_OTHER): Payer: Medicare Other | Admitting: Dietician

## 2015-07-22 ENCOUNTER — Encounter: Payer: Self-pay | Admitting: Internal Medicine

## 2015-07-22 ENCOUNTER — Ambulatory Visit (INDEPENDENT_AMBULATORY_CARE_PROVIDER_SITE_OTHER): Payer: Medicare Other | Admitting: Internal Medicine

## 2015-07-22 ENCOUNTER — Encounter: Payer: Self-pay | Admitting: Dietician

## 2015-07-22 VITALS — BP 143/68 | HR 65 | Temp 97.8°F | Ht 73.0 in | Wt 173.1 lb

## 2015-07-22 DIAGNOSIS — E1165 Type 2 diabetes mellitus with hyperglycemia: Secondary | ICD-10-CM | POA: Diagnosis not present

## 2015-07-22 DIAGNOSIS — E1142 Type 2 diabetes mellitus with diabetic polyneuropathy: Secondary | ICD-10-CM | POA: Diagnosis not present

## 2015-07-22 DIAGNOSIS — G547 Phantom limb syndrome without pain: Secondary | ICD-10-CM

## 2015-07-22 DIAGNOSIS — G546 Phantom limb syndrome with pain: Secondary | ICD-10-CM | POA: Diagnosis not present

## 2015-07-22 DIAGNOSIS — Z6822 Body mass index (BMI) 22.0-22.9, adult: Secondary | ICD-10-CM

## 2015-07-22 DIAGNOSIS — Z89611 Acquired absence of right leg above knee: Secondary | ICD-10-CM | POA: Diagnosis not present

## 2015-07-22 DIAGNOSIS — Z794 Long term (current) use of insulin: Secondary | ICD-10-CM

## 2015-07-22 LAB — GLUCOSE, CAPILLARY: GLUCOSE-CAPILLARY: 332 mg/dL — AB (ref 65–99)

## 2015-07-22 MED ORDER — LIRAGLUTIDE 18 MG/3ML ~~LOC~~ SOPN: 0.6000 mg | PEN_INJECTOR | Freq: Every morning | SUBCUTANEOUS | Status: DC

## 2015-07-22 NOTE — Assessment & Plan Note (Signed)
A: Continues to have intermittent phantom limb pain as described above.   P: I spent 5 minutes showing him a video of "mirror therapy" and the technique should he have another episode. He verbalized understanding and said he would attempt it if his symptoms were to recur.

## 2015-07-22 NOTE — Assessment & Plan Note (Signed)
A: Based on home BG readings always over 300 in setting of augmented insulin regimen, his diabetes remains uncontrolled. I will had a GLP-1 agonist today in an effort for better control. If he remains uncontrolled, referral to an endocrinologist would be warranted.  P: Liraglutide 0.6 mg daily

## 2015-07-22 NOTE — Progress Notes (Signed)
  Medical Nutrition Therapy:  Appt start time: 0930 end time:  1000. Visit # 1 this year  Assessment:  Primary concerns today: glycemic control.  Nathan [name] is frustrated by his lack of progress with his blood sugars. hje says one in a while he misses his insulin and sees no difference in his blood sugar. He has little subcutaneus fat and was instructed today to inject using the needle on an 45 degree angle. He also did not know to expect the lumps that lantus insulin leaves upon injection. He reports being adherent to his insulin regimen for the most part. (He takes 80 units lantus instead of 84 so he doesn't have to inject himself twice). He re;ports a reasonable diet, his weight  Preferred Learning Style: No preference indicated  Learning Readiness: Ready to change his medicines, not ready to change his food intake or activity, which is reasonable  ANTHROPOMETRICS: weight-173# with is prosthesis BMI-22-23 with his prosthesis WEIGHT HISTORY:stable and appropppriate SLEEP:need to assess at future visit MEDICATIONS: takes 80 units lantus daily, 32 units Humalog 3x/day BLOOD SUGAR: elevated with a1C 12% DIETARY INTAKE: reasonable per his report, some sweets once in a while but not excessive.   Usual physical activity: adls  Progress Towards Goal(s):  No progress.   Nutritional Diagnosis:  [place]-2.2 Altered nutrition-related laboratory As related to his diabetes and profound insulin resisance as evidenced by high high blood sugars, A1C and insulin needs.  NI-5.6.3 Inappropriate intake of fats (specify): transfats, saturated and polyunsaturated fats as related to cardiovascular disease risk and metabolic syndrome Improving.     Intervention:  Nutrition education on action of victoza, how to handle the side effects, low blood sugar, insulin resistance. Coordination of care: increase victoza to 1.2mg /day if he is tolerating 0.6mg /daily at follow up. Then will likely benefit from  1.8mg  daily  with decreased insulin as needed. hopefully he can decrease injection burden by stopping prandial insulin.   Teaching Method Utilized: Visual, Auditory,Hands on Handouts given during visit include:how to inject, information on victoza and insulin resistance Barriers to learning/adherence to lifestyle change:none Demonstrated degree of understanding via:  Teach Back   Monitoring/Evaluation:  Dietary intake, exercise, meter, and body weight in 1 week(s) by phone 6 -8 weeks in office.

## 2015-07-22 NOTE — Patient Instructions (Addendum)
[name], it was a pleasure seeing you today.  For your diabetes, we are prescribing a new medication called Victoza (liraglutide). You will maintain your current insulin regimen. You will inject this just like your insulin. Please follow up in in two weeks to see if this has been effective.   Liraglutide injection What is this medicine? LIRAGLUTIDE (LIR a GLOO tide) is used to improve blood sugar control in adults with type 2 diabetes. This medicine may be used with other oral diabetes medicines. This medicine may be used for other purposes; ask your health care provider or pharmacist if you have questions. What should I tell my health care provider before I take this medicine? They need to know if you have any of these conditions: -endocrine tumors (MEN 2) or if someone in your family had these tumors -gallstones -high cholesterol -history of alcohol abuse problem -history of pancreatitis -kidney disease or if you are on dialysis -liver disease -previous swelling of the tongue, face, or lips with difficulty breathing, difficulty swallowing, hoarseness, or tightening of the throat -stomach problems -suicidal thoughts, plans, or attempt; a previous suicide attempt by you or a family member -thyroid cancer or if someone in your family had thyroid cancer -an unusual or allergic reaction to liraglutide, medicines, foods, dyes, or preservatives -pregnant or trying to get pregnant -breast-feeding How should I use this medicine? This medicine is for injection under the skin of your upper leg, stomach area, or upper arm. You will be taught how to prepare and give this medicine. Use exactly as directed. Take your medicine at regular intervals. Do not take it more often than directed. It is important that you put your used needles and syringes in a special sharps container. Do not put them in a trash can. If you do not have a sharps container, call your pharmacist or healthcare provider to get  one. A special MedGuide will be given to you by the pharmacist with each prescription and refill. Be sure to read this information carefully each time. Talk to your pediatrician regarding the use of this medicine in children. Special care may be needed. Overdosage: If you think you have taken too much of this medicine contact a poison control center or emergency room at once. NOTE: This medicine is only for you. Do not share this medicine with others. What if I miss a dose? If you miss a dose, take it as soon as you can. If it is almost time for your next dose, take only that dose. Do not take double or extra doses. What may interact with this medicine? -acetaminophen -atorvastatin -birth control pills -digoxin -griseofulvin -lisinoprilMany medications may cause changes in blood sugar, these include: -alcohol containing beverages -aspirin and aspirin-like drugs -chloramphenicol -chromium -diuretics -male hormones, such as estrogens or progestins, birth control pills -heart medicines -isoniazid -male hormones or anabolic steroids -medications for weight loss -medicines for allergies, asthma, cold, or cough -medicines for mental problems -medicines called MAO inhibitors - Nardil, Parnate, Marplan, Eldepryl -niacin -NSAIDS, such as ibuprofen -pentamidine -phenytoin -probenecid -quinolone antibiotics such as ciprofloxacin, levofloxacin, ofloxacin -some herbal dietary supplements -steroid medicines such as prednisone or cortisone -thyroid hormonesSome medications can hide the warning symptoms of low blood sugar (hypoglycemia). You may need to monitor your blood sugar more closely if you are taking one of these medications. These include: -beta-blockers, often used for high blood pressure or heart problems (examples include atenolol, metoprolol, propranolol) -clonidine -guanethidine -reserpine This list may not describe all possible interactions.  Give your health care provider a  list of all the medicines, herbs, non-prescription drugs, or dietary supplements you use. Also tell them if you smoke, drink alcohol, or use illegal drugs. Some items may interact with your medicine. What should I watch for while using this medicine? Visit your doctor or health care professional for regular checks on your progress. A test called the HbA1C (A1C) will be monitored. This is a simple blood test. It measures your blood sugar control over the last 2 to 3 months. You will receive this test every 3 to 6 months. Learn how to check your blood sugar. Learn the symptoms of low and high blood sugar and how to manage them. Always carry a quick-source of sugar with you in case you have symptoms of low blood sugar. Examples include hard sugar candy or glucose tablets. Make sure others know that you can choke if you eat or drink when you develop serious symptoms of low blood sugar, such as seizures or unconsciousness. They must get medical help at once. Tell your doctor or health care professional if you have high blood sugar. You might need to change the dose of your medicine. If you are sick or exercising more than usual, you might need to change the dose of your medicine. Do not skip meals. Ask your doctor or health care professional if you should avoid alcohol. Many nonprescription cough and cold products contain sugar or alcohol. These can affect blood sugar. Liraglutide pens and cartridges should never be shared. Even if the needle is changed, sharing may result in passing of viruses like hepatitis or HIV. Wear a medical ID bracelet or chain, and carry a card that describes your disease and details of your medicine and dosage times. Patients and their families should watch out for worsening depression or thoughts of suicide. Also watch out for sudden changes in feelings such as feeling anxious, agitated, panicky, irritable, hostile, aggressive, impulsive, severely restless, overly excited and  hyperactive, or not being able to sleep. If this happens, especially at the beginning of treatment or after a change in dose, call your health care professional. What side effects may I notice from receiving this medicine? Side effects that you should report to your doctor or health care professional as soon as possible: -allergic reactions like skin rash, itching or hives, swelling of the face, lips, or tongue -breathing problems -fever, chills -loss of appetite -signs and symptoms of low blood sugar such as feeling anxious, confusion, dizziness, increased hunger, unusually weak or tired, sweating, shakiness, cold, irritable, headache, blurred vision, fast heartbeat, loss of consciousness -trouble passing urine or change in the amount of urine -unusual stomach pain or upset -vomiting Side effects that usually do not require medical attention (Report these to your doctor or health care professional if they continue or are bothersome.): -constipation -diarrhea -fatigue -headache -nausea This list may not describe all possible side effects. Call your doctor for medical advice about side effects. You may report side effects to FDA at 1-800-FDA-1088. Where should I keep my medicine? Keep out of the reach of children. Store unopened pen in a refrigerator between 2 and 8 degrees C (36 and 46 degrees F). Do not freeze or use if the medicine has been frozen. Protect from light and excessive heat. After you first use the pen, it can be stored at room temperature between 15 and 30 degrees C (59 and 86 degrees F) or in a refrigerator. Throw away your used pen after 30 days  or after the expiration date, whichever comes first. Do not store your pen with the needle attached. If the needle is left on, medicine may leak from the pen. NOTE: This sheet is a summary. It may not cover all possible information. If you have questions about this medicine, talk to your doctor, pharmacist, or health care provider.     2016, Elsevier/Gold Standard. (2013-07-03 10:19:14)

## 2015-07-22 NOTE — Progress Notes (Signed)
   Subjective:    Patient ID: Nathan [name], male    DOB: 08-Apr-1951, 65 y.o.   MRN: [id number]  HPI  Nathan [name] is a 65 year old gentleman who comes to the clinic to follow up on a recent change in his diabetes medication and to discuss his phantom limb pain. He checks his sugars 3-4 times a day. Prior to his last visit, he was taking 73U lantus daily (prescribed 76U), Humalog TID WC 30U. These were increased to 84U In the AM and 33U humalog. However, due to the limitations of his pen, he can only take up to 80U Lantus at a time. Nonetheless, fasting, his BG remains in the 300s in the morning. He reports numbness and tingling in his left foot and well as aberrant changes in sensation. He was seen be [facility] last week, who reported "good blood flow" in his left leg. His diet is as follows: banana + coffee +/- cereal for breakfast, for lunch a "[employer]" (fried chicken, pasta, vegetable), and his dinner is most often "curry based" due to having an [place] roommate.   With respect to his phantom limb pain, he says the episodes are sporadic. Sometimes they can last hours to days, and sometimes he can go up to 3 or 4 months at a time. He says "there's no way to fix it." He has not tried mirror therapy.  Review of Systems  Constitutional: Negative for fever, chills and unexpected weight change.  HENT: Negative for sneezing and sore throat.   Eyes: Negative for pain and visual disturbance.  Respiratory: Negative for cough and shortness of breath.   Gastrointestinal: Negative for abdominal pain and diarrhea.  Endocrine: Positive for polydipsia and polyphagia. Negative for polyuria.  Genitourinary: Negative for decreased urine volume and difficulty urinating.  Neurological: Positive for numbness. Negative for dizziness and weakness.  Psychiatric/Behavioral: Negative for dysphoric mood. The patient is not nervous/anxious.        Objective:   Physical Exam  Constitutional: He appears  well-developed and well-nourished.  HENT:  Mouth/Throat: Oropharynx is clear and moist. No oropharyngeal exudate.  Neck: Normal range of motion. Neck supple.  Pulmonary/Chest: Effort normal and breath sounds normal.  Abdominal: Soft. Bowel sounds are normal. He exhibits no distension. There is no tenderness.  Musculoskeletal:  Right AKA  Neurological:  Diminished sensation of LLE  Vitals reviewed.       Assessment & Plan:   Please see problem based A&P for details.

## 2015-07-22 NOTE — Progress Notes (Signed)
Internal Medicine Clinic Attending  Case discussed with Dr. Ford at the time of the visit.  We reviewed the resident's history and exam and pertinent patient test results.  I agree with the assessment, diagnosis, and plan of care documented in the resident's note.  

## 2015-07-29 ENCOUNTER — Other Ambulatory Visit: Payer: Self-pay | Admitting: *Deleted

## 2015-07-29 DIAGNOSIS — M79661 Pain in right lower leg: Secondary | ICD-10-CM

## 2015-07-29 MED ORDER — OXYCODONE HCL 10 MG PO TABS: 10.0000 mg | ORAL_TABLET | Freq: Three times a day (TID) | ORAL | Status: DC | PRN

## 2015-08-02 ENCOUNTER — Telehealth: Payer: Self-pay | Admitting: Dietician

## 2015-08-02 NOTE — Telephone Encounter (Signed)
Follow up call re new diabetes medicine

## 2015-08-04 ENCOUNTER — Telehealth: Payer: Self-pay | Admitting: Internal Medicine

## 2015-08-04 NOTE — Telephone Encounter (Signed)
APPT. REMINDER CALL, LMTCB °

## 2015-08-06 ENCOUNTER — Ambulatory Visit (INDEPENDENT_AMBULATORY_CARE_PROVIDER_SITE_OTHER): Payer: Medicare Other | Admitting: Internal Medicine

## 2015-08-06 ENCOUNTER — Encounter: Payer: Self-pay | Admitting: Internal Medicine

## 2015-08-06 VITALS — BP 119/62 | HR 74 | Temp 98.1°F | Ht 73.0 in | Wt 174.7 lb

## 2015-08-06 DIAGNOSIS — Z87891 Personal history of nicotine dependence: Secondary | ICD-10-CM | POA: Diagnosis not present

## 2015-08-06 DIAGNOSIS — Z794 Long term (current) use of insulin: Secondary | ICD-10-CM | POA: Diagnosis not present

## 2015-08-06 DIAGNOSIS — E1165 Type 2 diabetes mellitus with hyperglycemia: Secondary | ICD-10-CM

## 2015-08-06 DIAGNOSIS — E1142 Type 2 diabetes mellitus with diabetic polyneuropathy: Secondary | ICD-10-CM

## 2015-08-06 LAB — GLUCOSE, CAPILLARY: GLUCOSE-CAPILLARY: 296 mg/dL — AB (ref 65–99)

## 2015-08-06 MED ORDER — LIRAGLUTIDE 18 MG/3ML ~~LOC~~ SOPN: 1.2000 mg | PEN_INJECTOR | Freq: Every morning | SUBCUTANEOUS | Status: DC

## 2015-08-06 NOTE — Assessment & Plan Note (Signed)
Patient reports improved blood sugar control, though not at goal.  He is currently using Lantus 80 U qHS, Humalog 33 U TID qAC, and Liraglutide 0.6 mg.  Glucometer logs show improved sugar control, now ranging from 200-400, but no readings >400.  His polyuria has improved.  His diet varies, and he still eats lots of carbohydrates.  He does not drink soda or sweet tea.  Lab Results  Component Value Date   HGBA1C 12.0 07/08/2015   HGBA1C 9.2 03/02/2015   HGBA1C 9.4 08/27/2014     Assessment: Diabetes control:  poor Progress toward A1C goal:   improving Comments: patient still eating lots of carbs  Plan: Medications:  continue current medications, Lantus 80 U qHS, Humalog 33 U TID, increase Liraglutide to 1.2 mg daily Home glucose monitoring: Frequency:   Timing:   Instruction/counseling given: reminded to bring blood glucose meter & log to each visit and reminded to bring medications to each visit Educational resources provided: other (see comments) (DECLINED) Self management tools provided:   Other plans: recheck glucometer in 1 month

## 2015-08-06 NOTE — Progress Notes (Signed)
Patient ID: Nathan [name], male   DOB: 1950/07/03, 64 y.o.   MRN: [id number]   Subjective:   Patient ID: Nathan [name] male   DOB: 20-Jan-1951 64 y.o.   MRN: [id number]  HPI: [name] is a 65 y.o. male with PMH as below, here for DMII f/u.  Please see Problem-Based charting for the status of the patient's chronic medical issues.     Past Medical History  Diagnosis Date  . Diabetes mellitus   . HIV infection ([place])     undetectable viral load and CD4 ct 667 as of 11/2011  . Colon polyps     noted previous colonoscopy [facility]  . Hyperlipidemia   . Multiple sclerosis ([place])     in remission as of 11/2011 (diagnosed late 1980s)  . GERD (gastroesophageal reflux disease)   . Hypertension   . MRSA (methicillin resistant Staphylococcus aureus)   . 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 History of syphilis     noted [facility] records  . Asthma     per 2003 [facility] pulm records pfts   . DDD (degenerative disc disease)     cervical spine  . PVD (peripheral vascular disease) ([place])     Left Stent 07/02/2008  . Depression   . Pneumonia   . UTI (urinary tract infection)   . Clotting disorder ([place])   . Blood dyscrasia     HIV  . Fever     [place] origin  . Hep C w/o coma, chronic ([place])   . Pain in limb-Right Leg 10/13/2013   Current Outpatient Prescriptions  Medication Sig Dispense Refill  . atorvastatin (LIPITOR) 40 MG tablet TAKE 1 TABLET BY MOUTH DAILY 90 tablet 2  . BD PEN NEEDLE NANO U/F 32G X 4 MM MISC USE AS DIRECTED FOUR TIMES DAILY 100 each 0  . Blood Glucose Monitoring Suppl (BAYER CONTOUR MONITOR) W/DEVICE KIT Use to check blood sugar as instructed up to 4 times a day 1 kit 0  . cephALEXin (KEFLEX) 500 MG capsule Take 1 capsule (500 mg total) by mouth 2 (two) times daily. 14 capsule 0  . clopidogrel (PLAVIX) 75 MG tablet Take 1 tablet (75 mg total) by mouth daily. 90 tablet 3  . emtricitabine-tenofovir AF (DESCOVY) 200-25 MG tablet Take 1 tablet by mouth  daily. 30 tablet 11  . glucose blood (BAYER CONTOUR NEXT TEST) test strip Four times daily before meals and at bedtime, insulin dependent, ICD-10 CODE E11.42 120 each 11  . ibuprofen (ADVIL,MOTRIN) 200 MG tablet Take 400-600 mg by mouth every 6 (six) hours as needed.    . Insulin Glargine (LANTUS SOLOSTAR) 100 UNIT/ML Solostar Pen Inject 84 Units into the skin daily at 10 pm. 15 mL 11  . insulin lispro (HUMALOG KWIKPEN) 100 UNIT/ML KiwkPen Inject 0.33 mLs (33 Units total) into the skin 3 (three) times daily with meals. 15 mL 11  . Insulin Pen Needle (PEN NEEDLES 3/16") 31G X 5 MM MISC 1 application by Does not apply route 4 (four) times daily. Use to inject insulin twice daily Dx code 250.00 120 each 11  . Lancet Devices (BAYER MICROLET 2 LANCING DEVIC) MISC Use to check blood sugar up to 3 ties a day 1 each 2  . Lancets MISC Use to Test Blood Sugar Three Times Daily. Dx Code: 250.02 100 each 3  . Liraglutide 18 MG/3ML SOPN Inject 0.1 mLs (0.6 mg total) into the skin every morning. 6 mL  1  . lisinopril (PRINIVIL,ZESTRIL) 40 MG tablet Take 1 tablet (40 mg total) by mouth daily. 90 tablet 3  . omeprazole (PRILOSEC) 40 MG capsule Take 1 capsule (40 mg total) by mouth daily. 90 capsule 1  . Oxycodone HCl 10 MG TABS Take 1 tablet (10 mg total) by mouth 3 (three) times daily as needed. 90 tablet 0  . pregabalin (LYRICA) 100 MG capsule Take 1 capsule (100 mg total) by mouth 3 (three) times daily. 90 capsule 2  . raltegravir (ISENTRESS) 400 MG tablet Take 1 tablet (400 mg total) by mouth 2 (two) times daily. 60 tablet 11  . zidovudine (RETROVIR) 300 MG tablet Take 1 tablet (300 mg total) by mouth 2 (two) times daily. 60 tablet 11   No current facility-administered medications for this visit.   Family History  Problem Relation Age of Onset  . Diabetes Mother   . Coronary artery disease Mother   . Cancer Brother     colon caner stage 4 as of 11/2011 (unknown age of onset)  . Prostate cancer Brother     . Coronary artery disease Father    Social History   Social History  . Marital Status: Single    Spouse Name: N/A  . Number of Children: N/A  . Years of Education: N/A   Social History Main Topics  . Smoking status: Former Smoker    Quit date: 05/09/2007  . Smokeless tobacco: Never Used  . Alcohol Use: No  . Drug Use: No  . Sexual Activity: Not Asked   Other Topics Concern  . None   Social History Narrative   Review of Systems: Denies lightheadedness, dizziness, CP, SOB, nausea or vomiting.  He still has polydipsia, though polyuria has improved. Objective:  Physical Exam: Filed Vitals:   08/06/15 0923  BP: 119/62  Pulse: 74  Temp: 98.1 F (36.7 C)  TempSrc: Oral  Height: _0  (1.854 m)  Weight: 174 lb 11.2 oz (79.243 kg)  SpO2: 97%   Physical Exam  Constitutional: He is oriented to person, place, and time and well-developed, well-nourished, and in no distress. No distress.  HENT:  Head: Normocephalic and atraumatic.  Eyes: EOM are normal. No scleral icterus.  Neck: No tracheal deviation present.  Cardiovascular: Normal rate, regular rhythm and normal heart sounds.   Pulmonary/Chest: Effort normal and breath sounds normal. No stridor. No respiratory distress. He has no wheezes.  Neurological: He is alert and oriented to person, place, and time.  Skin: Skin is warm and dry. He is not diaphoretic.     Assessment & Plan:   Patient and case were discussed with Dr. Lynnae January.  Please refer to Problem Based charting for further documentation.

## 2015-08-06 NOTE — Patient Instructions (Signed)
1. Continue Lantus and Humalog insulin injections. 2. Increase Liraglutide to 1.2 mg (0.2 mL) every morning. 3. Return to clinic in 1 month.  Hypoglycemia Hypoglycemia occurs when the glucose in your blood is too low. Glucose is a type of sugar that is your body's main energy source. Hormones, such as insulin and glucagon, control the level of glucose in the blood. Insulin lowers blood glucose and glucagon increases blood glucose. Having too much insulin in your blood stream, or not eating enough food containing sugar, can result in hypoglycemia. Hypoglycemia can happen to people with or without diabetes. It can develop quickly and can be a medical emergency.  CAUSES   Missing or delaying meals.  Not eating enough carbohydrates at meals.  Taking too much diabetes medicine.  Not timing your oral diabetes medicine or insulin doses with meals, snacks, and exercise.  Nausea and vomiting.  Certain medicines.  Severe illnesses, such as hepatitis, kidney disorders, and certain eating disorders.  Increased activity or exercise without eating something extra or adjusting medicines.  Drinking too much alcohol.  A nerve disorder that affects body functions like your heart rate, blood pressure, and digestion (autonomic neuropathy).  A condition where the stomach muscles do not function properly (gastroparesis). Therefore, medicines and food may not absorb properly.  Rarely, a tumor of the pancreas can produce too much insulin. SYMPTOMS   Hunger.  Sweating (diaphoresis).  Change in body temperature.  Shakiness.  Headache.  Anxiety.  Lightheadedness.  Irritability.  Difficulty concentrating.  Dry mouth.  Tingling or numbness in the hands or feet.  Restless sleep or sleep disturbances.  Altered speech and coordination.  Change in mental status.  Seizures or prolonged convulsions.  Combativeness.  Drowsiness (lethargic).  Weakness.  Increased heart rate or  palpitations.  Confusion.  Pale, gray skin color.  Blurred or double vision.  Fainting. DIAGNOSIS  A physical exam and medical history will be performed. Your caregiver may make a diagnosis based on your symptoms. Blood tests and other lab tests may be performed to confirm a diagnosis. Once the diagnosis is made, your caregiver will see if your signs and symptoms go away once your blood glucose is raised.  TREATMENT  Usually, you can easily treat your hypoglycemia when you notice symptoms.  Check your blood glucose. If it is less than 70 mg/dl, take one of the following:   3-4 glucose tablets.    cup juice.    cup regular soda.   1 cup skim milk.   -1 tube of glucose gel.   5-6 hard candies.   Avoid high-fat drinks or food that may delay a rise in blood glucose levels.  Do not take more than the recommended amount of sugary foods, drinks, gel, or tablets. Doing so will cause your blood glucose to go too high.   Wait 10-15 minutes and recheck your blood glucose. If it is still less than 70 mg/dl or below your target range, repeat treatment.   Eat a snack if it is more than 1 hour until your next meal.  There may be a time when your blood glucose may go so low that you are unable to treat yourself at home when you start to notice symptoms. You may need someone to help you. You may even faint or be unable to swallow. If you cannot treat yourself, someone will need to bring you to the hospital.  [place]  If you have diabetes, follow your diabetes management plan by:  Taking  your medicines as directed.  Following your exercise plan.  Following your meal plan. Do not skip meals. Eat on time.  Testing your blood glucose regularly. Check your blood glucose before and after exercise. If you exercise longer or different than usual, be sure to check blood glucose more frequently.  Wearing your medical alert jewelry that says you have  diabetes.  Identify the cause of your hypoglycemia. Then, develop ways to prevent the recurrence of hypoglycemia.  Do not take a hot bath or shower right after an insulin shot.  Always carry treatment with you. Glucose tablets are the easiest to carry.  If you are going to drink alcohol, drink it only with meals.  Tell friends or family members ways to keep you safe during a seizure. This may include removing hard or sharp objects from the area or turning you on your side.  Maintain a healthy weight. SEEK MEDICAL CARE IF:   You are having problems keeping your blood glucose in your target range.  You are having frequent episodes of hypoglycemia.  You feel you might be having side effects from your medicines.  You are not sure why your blood glucose is dropping so low.  You notice a change in vision or a new problem with your vision. SEEK IMMEDIATE MEDICAL CARE IF:   Confusion develops.  A change in mental status occurs.  The inability to swallow develops.  Fainting occurs.   This information is not intended to replace advice given to you by your health care provider. Make sure you discuss any questions you have with your health care provider.   Document Released: 04/24/2005 Document Revised: 04/29/2013 Document Reviewed: 12/29/2014 Elsevier Interactive Patient Education [employer].

## 2015-08-09 NOTE — Progress Notes (Signed)
I don't know the status.  I had put in a DME order when I saw him on 07/08/2015 for his prosthetic supplies.  I'll send a message to Ulis Rias to see if she can help.  Thanks.

## 2015-08-10 NOTE — Progress Notes (Signed)
Internal Medicine Clinic Attending  Case discussed with Dr. Taylor at the time of the visit.  We reviewed the resident's history and exam and pertinent patient test results.  I agree with the assessment, diagnosis, and plan of care documented in the resident's note. 

## 2015-08-30 ENCOUNTER — Other Ambulatory Visit: Payer: Self-pay | Admitting: *Deleted

## 2015-08-30 ENCOUNTER — Other Ambulatory Visit: Payer: Self-pay | Admitting: Infectious Disease

## 2015-08-30 DIAGNOSIS — E7800 Pure hypercholesterolemia, unspecified: Secondary | ICD-10-CM

## 2015-08-30 DIAGNOSIS — M79661 Pain in right lower leg: Secondary | ICD-10-CM

## 2015-08-30 MED ORDER — OXYCODONE HCL 10 MG PO TABS: 10.0000 mg | ORAL_TABLET | Freq: Three times a day (TID) | ORAL | Status: DC | PRN

## 2015-09-28 ENCOUNTER — Other Ambulatory Visit: Payer: Self-pay | Admitting: *Deleted

## 2015-09-28 DIAGNOSIS — M79661 Pain in right lower leg: Secondary | ICD-10-CM

## 2015-09-28 MED ORDER — OXYCODONE HCL 10 MG PO TABS: 10.0000 mg | ORAL_TABLET | Freq: Three times a day (TID) | ORAL | Status: DC | PRN

## 2015-10-07 ENCOUNTER — Encounter: Payer: Self-pay | Admitting: Dietician

## 2015-10-07 ENCOUNTER — Ambulatory Visit (INDEPENDENT_AMBULATORY_CARE_PROVIDER_SITE_OTHER): Payer: Medicare Other | Admitting: Internal Medicine

## 2015-10-07 VITALS — BP 122/59 | HR 86 | Temp 97.6°F | Wt 175.5 lb

## 2015-10-07 DIAGNOSIS — Z794 Long term (current) use of insulin: Secondary | ICD-10-CM

## 2015-10-07 DIAGNOSIS — I10 Essential (primary) hypertension: Secondary | ICD-10-CM | POA: Diagnosis not present

## 2015-10-07 DIAGNOSIS — E1165 Type 2 diabetes mellitus with hyperglycemia: Secondary | ICD-10-CM | POA: Diagnosis present

## 2015-10-07 DIAGNOSIS — Z23 Encounter for immunization: Secondary | ICD-10-CM

## 2015-10-07 DIAGNOSIS — Z89611 Acquired absence of right leg above knee: Secondary | ICD-10-CM | POA: Diagnosis not present

## 2015-10-07 DIAGNOSIS — Z87891 Personal history of nicotine dependence: Secondary | ICD-10-CM

## 2015-10-07 DIAGNOSIS — E1142 Type 2 diabetes mellitus with diabetic polyneuropathy: Secondary | ICD-10-CM | POA: Diagnosis not present

## 2015-10-07 DIAGNOSIS — Z0000 Encounter for general adult medical examination without abnormal findings: Secondary | ICD-10-CM

## 2015-10-07 LAB — HM DIABETES EYE EXAM

## 2015-10-07 LAB — POCT GLYCOSYLATED HEMOGLOBIN (HGB A1C): Hemoglobin A1C: 10.2

## 2015-10-07 LAB — GLUCOSE, CAPILLARY: GLUCOSE-CAPILLARY: 339 mg/dL — AB (ref 65–99)

## 2015-10-07 MED ORDER — LIRAGLUTIDE 18 MG/3ML ~~LOC~~ SOPN: 1.8000 mg | PEN_INJECTOR | Freq: Every morning | SUBCUTANEOUS | Status: DC

## 2015-10-07 MED ORDER — INSULIN GLARGINE 100 UNIT/ML SOLOSTAR PEN: 80.0000 [IU] | PEN_INJECTOR | Freq: Every day | SUBCUTANEOUS | Status: DC

## 2015-10-07 NOTE — Patient Instructions (Signed)
Thank you for coming to see me today. It was a pleasure. Today we talked about:   Your diabetes: we increased the dose of your Liraglutide to 1.8 daily.  I sent in this prescription to your pharmacy.  Let us know if you have an issue with your prosthetic supplies.  I apologize for this mix-up.  Please follow-up with me in 3 months.  If you have any questions or concerns, please do not hesitate to call the office at [phone].  Take Care,   Jule Ser, DO

## 2015-10-07 NOTE — Progress Notes (Signed)
Patient ID: Nathan [name], male   DOB: 1950-08-12, 65 y.o.   MRN: [id number]   Subjective:   Patient ID: Nathan [name] male   DOB: 1950/09/05 65 y.o.   MRN: [id number]  HPI: Nathan [name] is a 65 y.o. man with PMHx as detailed below here today for diabetes follow up.  Please see A&P for status of patients chronic medical conditions.    Past Medical History  Diagnosis Date  . Diabetes mellitus     dx 2010  . HIV infection ([place])     undetectable viral load and CD4 ct 667 as of 11/2011  . Colon polyps     noted previous colonoscopy [facility]  . Hyperlipidemia   . Multiple sclerosis ([place])     in remission as of 11/2011 (diagnosed late 1980s)  . GERD (gastroesophageal reflux disease)   . Hypertension   . MRSA (methicillin resistant Staphylococcus aureus)   . 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[place])     2002  . History of syphilis     noted [facility] records  . Asthma     per 2003 [facility] pulm records pfts   . DDD (degenerative disc disease)     cervical spine  . PVD (peripheral vascular disease) ([place])     Left Stent 07/02/2008  . Depression   . Pneumonia   . UTI (urinary tract infection)   . Clotting disorder ([place])   . Blood dyscrasia     HIV  . Fever     unknwon origin  . Hep C w/o coma, chronic ([place])   . Pain in limb-Right Leg 10/13/2013   Current Outpatient Prescriptions  Medication Sig Dispense Refill  . atorvastatin (LIPITOR) 40 MG tablet TAKE 1 TABLET BY MOUTH DAILY 90 tablet 3  . BD PEN NEEDLE NANO U/F 32G X 4 MM MISC USE AS DIRECTED FOUR TIMES DAILY 100 each 0  . Blood Glucose Monitoring Suppl (BAYER CONTOUR MONITOR) W/DEVICE KIT Use to check blood sugar as instructed up to 4 times a day 1 kit 0  . clopidogrel (PLAVIX) 75 MG tablet Take 1 tablet (75 mg total) by mouth daily. 90 tablet 3  . emtricitabine-tenofovir AF (DESCOVY) 200-25 MG tablet Take 1 tablet by mouth daily. 30 tablet 11  . glucose blood (BAYER CONTOUR NEXT TEST) test strip Four times daily before meals  and at bedtime, insulin dependent, ICD-10 CODE E11.42 120 each 11  . ibuprofen (ADVIL,MOTRIN) 200 MG tablet Take 400-600 mg by mouth every 6 (six) hours as needed.    . Insulin Glargine (LANTUS SOLOSTAR) 100 UNIT/ML Solostar Pen Inject 80 Units into the skin daily at 10 pm. 15 mL 11  . insulin lispro (HUMALOG KWIKPEN) 100 UNIT/ML KiwkPen Inject 0.33 mLs (33 Units total) into the skin 3 (three) times daily with meals. 15 mL 11  . Insulin Pen Needle (PEN NEEDLES 3/16") 31G X 5 MM MISC 1 application by Does not apply route 4 (four) times daily. Use to inject insulin twice daily Dx code 250.00 120 each 11  . Lancet Devices (BAYER MICROLET 2 LANCING DEVIC) MISC Use to check blood sugar up to 3 ties a day 1 each 2  . Liraglutide 18 MG/3ML SOPN Inject 0.3 mLs (1.8 mg total) into the skin every morning. 6 mL 2  . lisinopril (PRINIVIL,ZESTRIL) 40 MG tablet Take 1 tablet (40 mg total) by mouth daily. 90 tablet 3  . omeprazole (PRILOSEC) 40 MG capsule Take 1 capsule (40 mg total) by mouth daily.  90 capsule 1  . Oxycodone HCl 10 MG TABS Take 1 tablet (10 mg total) by mouth 3 (three) times daily as needed. 90 tablet 0  . pregabalin (LYRICA) 100 MG capsule Take 1 capsule (100 mg total) by mouth 3 (three) times daily. 90 capsule 2  . raltegravir (ISENTRESS) 400 MG tablet Take 1 tablet (400 mg total) by mouth 2 (two) times daily. 60 tablet 11  . zidovudine (RETROVIR) 300 MG tablet Take 1 tablet (300 mg total) by mouth 2 (two) times daily. 60 tablet 11  . cephALEXin (KEFLEX) 500 MG capsule Take 1 capsule (500 mg total) by mouth 2 (two) times daily. (Patient not taking: Reported on 10/07/2015) 14 capsule 0  . Lancets MISC Use to Test Blood Sugar Three Times Daily. Dx Code: 250.02 100 each 3   No current facility-administered medications for this visit.   Family History  Problem Relation Age of Onset  . Diabetes Mother   . Coronary artery disease Mother   . Cancer Brother     colon caner stage 4 as of 11/2011  (unknown age of onset)  . Prostate cancer Brother   . Coronary artery disease Father    Social History   Social History  . Marital Status: Single    Spouse Name: N/A  . Number of Children: N/A  . Years of Education: 66   Social History Main Topics  . Smoking status: Former Smoker    Quit date: 05/09/2007  . Smokeless tobacco: Never Used  . Alcohol Use: No  . Drug Use: No  . Sexual Activity: Not on file   Other Topics Concern  . Not on file   Social History Narrative   Review of Systems: Review of Systems  Constitutional: Negative for fever and chills.  Eyes: Negative for blurred vision.  Respiratory: Negative for cough.   Cardiovascular: Negative for chest pain.  Gastrointestinal: Negative for abdominal pain.  Genitourinary: Negative for dysuria and frequency.    Objective:  Physical Exam: Filed Vitals:   10/07/15 1327  BP: 122/59  Pulse: 86  Temp: 97.6 F (36.4 C)  TempSrc: Oral  Weight: 175 lb 8 oz (79.606 kg)  SpO2: 98%   Physical Exam  Constitutional: He is oriented to person, place, and time and well-developed, well-nourished, and in no distress.  HENT:  Head: Normocephalic and atraumatic.  Eyes: EOM are normal.  Cardiovascular: Normal rate and regular rhythm.   Pulmonary/Chest: Effort normal.  Musculoskeletal:  He has a right AKA with prosthesis  Neurological: He is alert and oriented to person, place, and time.     Assessment & Plan:   Case discussed with Dr. Evette Doffing.    Essential (primary) hypertension BP Readings from Last 3 Encounters:  10/07/15 122/59  08/06/15 119/62  07/22/15 143/68   Assessment:  Blood pressure at 122/59, well-controlled on monotherapy with lisinopril 19m daily.  Plan:  - continue current meds: lisinopril 463mdaily - RTC 3 months to check A1c, continuing monitoring BP  DM type 2 with diabetic peripheral neuropathy Patient reports improved blood sugar control, though not at goal.  He is currently using Lantus  80 units QHS, Humalog 33units TID, and Liraglutide 1.74m43maily.  He was supposed to be taking 84 units Lantus but he reports today that his pen only goes up to 80 units and does not want to give himself further injections.  His CBGs according to his meter range from 200-490 with an average of 339.  His diet still includes a  lot of carbohydrates, but he does not drink soda or sweet teas.  Lab Results  Component Value Date   HGBA1C 10.2 10/07/2015   HGBA1C 12.0 07/08/2015   HGBA1C 9.2 03/02/2015     Assessment: Diabetes control:  poor Progress toward A1C goal:   improving Comments: patient still eating lots of carbs  Plan: Medications:  Continue Lantus 80 units QHS, Humalog 33units TID.  Increase liraglutide to 1.75m daily Home glucose monitoring: Frequency:  TID Timing:  before meals Instruction/counseling given: reminded to bring blood glucose meter & log to each visit and reminded to bring medications to each visit Other plans: recheck glucometer in 3 months and A1c.  May need to add another agent if A1c not at or near goal at that time     Health care maintenance Given Prevnar 13 today. Ordered retinal photographs today to be done with Ms. DButch PennyPlyler.

## 2015-10-08 ENCOUNTER — Encounter: Payer: Self-pay | Admitting: Internal Medicine

## 2015-10-08 NOTE — Assessment & Plan Note (Signed)
Patient reports improved blood sugar control, though not at goal.  He is currently using Lantus 80 units QHS, Humalog 33units TID, and Liraglutide 1.2mg  daily.  He was supposed to be taking 84 units Lantus but he reports today that his pen only goes up to 80 units and does not want to give himself further injections.  His CBGs according to his meter range from 200-490 with an average of 339.  His diet still includes a lot of carbohydrates, but he does not drink soda or sweet teas.  Lab Results  Component Value Date   HGBA1C 10.2 10/07/2015   HGBA1C 12.0 07/08/2015   HGBA1C 9.2 03/02/2015     Assessment: Diabetes control:  poor Progress toward A1C goal:   improving Comments: patient still eating lots of carbs  Plan: Medications:  Continue Lantus 80 units QHS, Humalog 33units TID.  Increase liraglutide to 1.8mg  daily Home glucose monitoring: Frequency:  TID Timing:  before meals Instruction/counseling given: reminded to bring blood glucose meter & log to each visit and reminded to bring medications to each visit Other plans: recheck glucometer in 3 months and A1c.  May need to add another agent if A1c not at or near goal at that time

## 2015-10-08 NOTE — Progress Notes (Signed)
Internal Medicine Clinic Attending  Case discussed with Dr. Wallace at the time of the visit.  We reviewed the resident's history and exam and pertinent patient test results.  I agree with the assessment, diagnosis, and plan of care documented in the resident's note.  

## 2015-10-08 NOTE — Assessment & Plan Note (Signed)
Given Prevnar 13 today. Ordered retinal photographs today to be done with Ms. [name] Plyler.

## 2015-10-08 NOTE — Assessment & Plan Note (Addendum)
BP Readings from Last 3 Encounters:  10/07/15 122/59  08/06/15 119/62  07/22/15 143/68   Assessment:  Blood pressure at 122/59, well-controlled on monotherapy with lisinopril 40mg  daily.  Plan:  - continue current meds: lisinopril 40mg  daily - RTC 3 months to check A1c, continuing monitoring BP

## 2015-10-11 ENCOUNTER — Encounter: Payer: Self-pay | Admitting: Dietician

## 2015-10-20 ENCOUNTER — Other Ambulatory Visit: Payer: Self-pay | Admitting: Infectious Disease

## 2015-10-28 ENCOUNTER — Other Ambulatory Visit: Payer: Self-pay | Admitting: Internal Medicine

## 2015-10-28 ENCOUNTER — Other Ambulatory Visit: Payer: Self-pay | Admitting: *Deleted

## 2015-10-28 DIAGNOSIS — E1142 Type 2 diabetes mellitus with diabetic polyneuropathy: Secondary | ICD-10-CM

## 2015-10-28 DIAGNOSIS — E1165 Type 2 diabetes mellitus with hyperglycemia: Principal | ICD-10-CM

## 2015-10-28 DIAGNOSIS — M79661 Pain in right lower leg: Secondary | ICD-10-CM

## 2015-10-28 DIAGNOSIS — IMO0002 Reserved for concepts with insufficient information to code with codable children: Secondary | ICD-10-CM

## 2015-10-28 MED ORDER — GLUCOSE BLOOD VI STRP: ORAL_STRIP | Status: DC

## 2015-10-28 MED ORDER — OXYCODONE HCL 10 MG PO TABS: 10.0000 mg | ORAL_TABLET | Freq: Three times a day (TID) | ORAL | Status: DC | PRN

## 2015-11-25 ENCOUNTER — Other Ambulatory Visit: Payer: Self-pay | Admitting: Internal Medicine

## 2015-11-25 ENCOUNTER — Other Ambulatory Visit: Payer: Self-pay | Admitting: Student in an Organized Health Care Education/Training Program

## 2015-11-25 ENCOUNTER — Encounter: Payer: Self-pay | Admitting: Infectious Disease

## 2015-11-26 ENCOUNTER — Other Ambulatory Visit: Payer: Self-pay

## 2015-11-26 DIAGNOSIS — M79661 Pain in right lower leg: Secondary | ICD-10-CM

## 2015-11-26 MED ORDER — OXYCODONE HCL 10 MG PO TABS: 10.0000 mg | ORAL_TABLET | Freq: Three times a day (TID) | ORAL | Status: DC | PRN

## 2015-11-26 NOTE — Telephone Encounter (Signed)
Patient is due for oxycodone 10 mg refill.  Dr Tommy Medal is out of the office so Dr Johnnye Sima will fill this month's script.   Laverle Patter, RN

## 2015-12-03 ENCOUNTER — Other Ambulatory Visit: Payer: Self-pay | Admitting: Internal Medicine

## 2015-12-03 DIAGNOSIS — E1142 Type 2 diabetes mellitus with diabetic polyneuropathy: Secondary | ICD-10-CM

## 2015-12-29 ENCOUNTER — Telehealth: Payer: Self-pay | Admitting: *Deleted

## 2015-12-29 DIAGNOSIS — M79661 Pain in right lower leg: Secondary | ICD-10-CM

## 2015-12-29 NOTE — Telephone Encounter (Signed)
Requesting pain medication refill, informed MD is out of the office until 01/03/16.  Patient stated he understands and he did call very late for a refill.

## 2016-01-04 MED ORDER — OXYCODONE HCL 10 MG PO TABS: 10.0000 mg | ORAL_TABLET | Freq: Three times a day (TID) | ORAL | 0 refills | Status: DC | PRN

## 2016-01-04 NOTE — Addendum Note (Signed)
Addended by: Lorne Skeens D on: 01/04/2016 09:21 AM   Modules accepted: Orders

## 2016-01-04 NOTE — Telephone Encounter (Signed)
MD available to sign pain rx refill. RN will print and provide MD with script to sign.  Pt scheduled for HIV lab work, UDS and 6 month follow-up with Dr. Tommy Medal.

## 2016-01-05 ENCOUNTER — Other Ambulatory Visit: Payer: Self-pay | Admitting: Infectious Disease

## 2016-01-05 ENCOUNTER — Other Ambulatory Visit (HOSPITAL_COMMUNITY)
Admission: RE | Admit: 2016-01-05 | Discharge: 2016-01-05 | Disposition: A | Discharge status: Home / Self Care | Payer: Medicare Other | Source: Ambulatory Visit | Attending: Infectious Disease | Admitting: Infectious Disease

## 2016-01-05 ENCOUNTER — Telehealth: Payer: Self-pay | Admitting: Internal Medicine

## 2016-01-05 ENCOUNTER — Other Ambulatory Visit: Payer: Medicare Other

## 2016-01-05 DIAGNOSIS — Z113 Encounter for screening for infections with a predominantly sexual mode of transmission: Secondary | ICD-10-CM | POA: Insufficient documentation

## 2016-01-05 DIAGNOSIS — G547 Phantom limb syndrome without pain: Secondary | ICD-10-CM

## 2016-01-05 DIAGNOSIS — M79661 Pain in right lower leg: Secondary | ICD-10-CM

## 2016-01-05 DIAGNOSIS — E785 Hyperlipidemia, unspecified: Secondary | ICD-10-CM | POA: Diagnosis not present

## 2016-01-05 DIAGNOSIS — I739 Peripheral vascular disease, unspecified: Secondary | ICD-10-CM | POA: Diagnosis not present

## 2016-01-05 DIAGNOSIS — B20 Human immunodeficiency virus [HIV] disease: Secondary | ICD-10-CM

## 2016-01-05 LAB — COMPLETE METABOLIC PANEL WITH GFR
ALK PHOS: 58 U/L (ref 40–115)
ALT: 40 U/L (ref 9–46)
AST: 29 U/L (ref 10–35)
Albumin: 4.4 g/dL (ref 3.6–5.1)
BUN: 13 mg/dL (ref 7–25)
CALCIUM: 9.6 mg/dL (ref 8.6–10.3)
CHLORIDE: 98 mmol/L (ref 98–110)
CO2: 23 mmol/L (ref 20–31)
CREATININE: 1 mg/dL (ref 0.70–1.25)
GFR, Est African American: >89 mL/min (ref >=60)
GFR, Est Non African American: 78 mL/min (ref >=60)
GLUCOSE: 300 mg/dL — AB (ref 65–99)
Potassium: 4.7 mmol/L (ref 3.5–5.3)
Sodium: 132 mmol/L — ABNORMAL LOW (ref 135–146)
Total Bilirubin: 0.6 mg/dL (ref 0.2–1.2)
Total Protein: 7.4 g/dL (ref 6.1–8.1)

## 2016-01-05 LAB — CBC WITH DIFFERENTIAL/PLATELET
BASOS PCT: 0 %
Basophils Absolute: 0 cells/uL (ref 0–200)
Eosinophils Absolute: 249 cells/uL (ref 15–500)
Eosinophils Relative: 3 %
HEMATOCRIT: 40.5 % (ref 38.5–50.0)
HEMOGLOBIN: 13.6 g/dL (ref 13.2–17.1)
LYMPHS ABS: 5312 {cells}/uL — AB (ref 850–3900)
Lymphocytes Relative: 64 %
MCH: 33.8 pg — ABNORMAL HIGH (ref 27.0–33.0)
MCHC: 33.6 g/dL (ref 32.0–36.0)
MCV: 100.7 fL — ABNORMAL HIGH (ref 80.0–100.0)
MONO ABS: 498 {cells}/uL (ref 200–950)
MPV: 8.6 fL (ref 7.5–12.5)
Monocytes Relative: 6 %
Neutro Abs: 2241 cells/uL (ref 1500–7800)
Neutrophils Relative %: 27 %
Platelets: 235 10*3/uL (ref 140–400)
RBC: 4.02 MIL/uL — AB (ref 4.20–5.80)
RDW: 14.1 % (ref 11.0–15.0)
WBC: 8.3 10*3/uL (ref 3.8–10.8)

## 2016-01-05 LAB — LIPID PANEL
Cholesterol: 192 mg/dL (ref 125–200)
HDL: 32 mg/dL — ABNORMAL LOW (ref >=40)
LDL Cholesterol: 86 mg/dL (ref <130)
Total CHOL/HDL Ratio: 6 Ratio — ABNORMAL HIGH (ref <=5.0)
Triglycerides: 370 mg/dL — ABNORMAL HIGH (ref <150)
VLDL: 74 mg/dL — ABNORMAL HIGH (ref <30)

## 2016-01-05 NOTE — Telephone Encounter (Signed)
APT. REMINDER CALL, LMTCB °

## 2016-01-06 ENCOUNTER — Encounter: Payer: Self-pay | Admitting: Internal Medicine

## 2016-01-06 ENCOUNTER — Ambulatory Visit (INDEPENDENT_AMBULATORY_CARE_PROVIDER_SITE_OTHER): Payer: Medicare Other | Admitting: Internal Medicine

## 2016-01-06 VITALS — BP 104/52 | HR 88 | Temp 97.8°F | Ht 73.0 in | Wt 173.1 lb

## 2016-01-06 DIAGNOSIS — Z794 Long term (current) use of insulin: Secondary | ICD-10-CM | POA: Diagnosis not present

## 2016-01-06 DIAGNOSIS — E1142 Type 2 diabetes mellitus with diabetic polyneuropathy: Secondary | ICD-10-CM | POA: Diagnosis not present

## 2016-01-06 DIAGNOSIS — I10 Essential (primary) hypertension: Secondary | ICD-10-CM | POA: Diagnosis not present

## 2016-01-06 DIAGNOSIS — Z23 Encounter for immunization: Secondary | ICD-10-CM

## 2016-01-06 DIAGNOSIS — Z89611 Acquired absence of right leg above knee: Secondary | ICD-10-CM | POA: Diagnosis not present

## 2016-01-06 DIAGNOSIS — Z87891 Personal history of nicotine dependence: Secondary | ICD-10-CM | POA: Diagnosis not present

## 2016-01-06 DIAGNOSIS — D126 Benign neoplasm of colon, unspecified: Secondary | ICD-10-CM

## 2016-01-06 DIAGNOSIS — Z79899 Other long term (current) drug therapy: Secondary | ICD-10-CM | POA: Diagnosis not present

## 2016-01-06 DIAGNOSIS — E7800 Pure hypercholesterolemia, unspecified: Secondary | ICD-10-CM

## 2016-01-06 DIAGNOSIS — E1165 Type 2 diabetes mellitus with hyperglycemia: Secondary | ICD-10-CM | POA: Diagnosis not present

## 2016-01-06 DIAGNOSIS — Z0000 Encounter for general adult medical examination without abnormal findings: Secondary | ICD-10-CM

## 2016-01-06 DIAGNOSIS — I739 Peripheral vascular disease, unspecified: Secondary | ICD-10-CM

## 2016-01-06 LAB — URINE CYTOLOGY ANCILLARY ONLY
CHLAMYDIA, DNA PROBE: NEGATIVE
NEISSERIA GONORRHEA: NEGATIVE

## 2016-01-06 LAB — POCT GLYCOSYLATED HEMOGLOBIN (HGB A1C): Hemoglobin A1C: 10.2

## 2016-01-06 LAB — RPR

## 2016-01-06 LAB — T-HELPER CELL (CD4) - (RCID CLINIC ONLY)
CD4 % Helper T Cell: 19 % — ABNORMAL LOW (ref 33–55)
CD4 T Cell Abs: 940 /uL (ref 400–2700)

## 2016-01-06 LAB — GLUCOSE, CAPILLARY: GLUCOSE-CAPILLARY: 457 mg/dL — AB (ref 65–99)

## 2016-01-06 MED ORDER — CLOPIDOGREL BISULFATE 75 MG PO TABS: 75.0000 mg | ORAL_TABLET | Freq: Every day | ORAL | 3 refills | Status: DC

## 2016-01-06 MED ORDER — PIOGLITAZONE HCL 30 MG PO TABS: 30.0000 mg | ORAL_TABLET | Freq: Every day | ORAL | 2 refills | Status: DC

## 2016-01-06 MED ORDER — INSULIN LISPRO 100 UNIT/ML (KWIKPEN): 33.0000 [IU] | PEN_INJECTOR | Freq: Three times a day (TID) | SUBCUTANEOUS | 11 refills | Status: DC

## 2016-01-06 MED ORDER — INSULIN PEN NEEDLE 32G X 4 MM MISC: 11 refills | Status: DC

## 2016-01-06 MED ORDER — ATORVASTATIN CALCIUM 40 MG PO TABS: 40.0000 mg | ORAL_TABLET | Freq: Every day | ORAL | 3 refills | Status: DC

## 2016-01-06 NOTE — Progress Notes (Signed)
Recent fill history for Nathan [name] from his pharmacy is as follows:  Atorvastatin (90 day supply): 5/22, 8/22 Clopidogrel (90 day supply): 3/15, 6/11 --> next refill due 9/11 Lantus (2 boxes of 5 pens, 37 day supply): 1/9, 2/12, 3/19, 4/20, 6/4, 7/11, 8/16 Humalog (1 box of 5 pens,  15 day supply): 5/30, 6/13, 6/27, 7/21, 8/9, 8/22  Lisinopril (90 day supply): 8/21 Victoza (1 box of 3 pens, 30 day supply): 7/31, 8/29  Belia Heman, PharmD PGY1 Resident 01/06/2016 2:19 PM

## 2016-01-06 NOTE — Patient Instructions (Signed)
Thank you for coming to see me today. It was a pleasure. Today we talked about:   Diabetes:  Please continue to take all your medications as prescribed.  I have added a new medication called pioglitazone.  Please take this 30mg  once daily.   Please follow-up with me in 3 months.  If you have any questions or concerns, please do not hesitate to call the office at [phone].  Take Care,   Jule Ser, DO

## 2016-01-06 NOTE — Progress Notes (Signed)
CC: here for DM, HTN follow up  HPI:  [name] is a 65 y.o. man with a past medical history of HIV, PVD s/p right AKA, HTN, DM here today for follow up of his DM and HTN.   For details of today's visit and the status of his chronic medical issues please refer to the assessment and plan.   Past Medical History:  Diagnosis Date  . Asthma    per 2003 [facility] pulm records pfts   . Blood dyscrasia    HIV  . Clotting disorder ([place])   . Colon polyps    noted previous colonoscopy [facility]  . DDD (degenerative disc disease)    cervical spine  . Depression   . Diabetes mellitus    dx 2010  . Fever    unknwon origin  . GERD (gastroesophageal reflux disease)   . Hep C w/o coma, chronic ([place])   . History of syphilis    noted [facility] records  . HIV infection ([place])    undetectable viral load and CD4 ct 667 as of 11/2011  . Hyperlipidemia   . Hypertension   . MRSA (methicillin resistant Staphylococcus aureus)   . Multiple sclerosis ([place])    in remission as of 11/2011 (diagnosed late 1980s)  . Pain in limb-Right Leg 10/13/2013  . 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[place])    2002  . Pneumonia   . PVD (peripheral vascular disease) ([place])    Left Stent 07/02/2008  . UTI (urinary tract infection)     Review of Systems:   Review of Systems  Constitutional: Negative for chills and fever.  Respiratory: Negative for cough and shortness of breath.   Cardiovascular: Negative for chest pain.  Gastrointestinal: Negative for abdominal pain, constipation, diarrhea, nausea and vomiting.  Genitourinary: Positive for frequency. Negative for dysuria, hematuria and urgency.  Endo/Heme/Allergies: Positive for polydipsia.     Physical Exam:  Vitals:   01/06/16 1415  BP: (!) 104/52  Pulse: 88  Temp: 97.8 F (36.6 C)  TempSrc: Oral  SpO2: 98%  Weight: 173 lb 1.6 oz (78.5 kg)  Height: 6\' 1"  (1.854 m)   Physical Exam  Constitutional: He is oriented to person, place, and time and  well-developed, well-nourished, and in no distress.  HENT:  Head: Normocephalic and atraumatic.  Lipodystrophy present.  Cardiovascular: Normal rate and regular rhythm.   Pulmonary/Chest: Effort normal and breath sounds normal.  Musculoskeletal:  Right AKA  Neurological: He is alert and oriented to person, place, and time.  Skin: Skin is warm and dry.  Psychiatric: Mood and affect normal.    Assessment & Plan:   See Encounters Tab for problem based charting.  Patient discussed with Dr. Eppie Gibson.  DM type 2 with diabetic peripheral neuropathy [facility]) Lab Results  Component Value Date   HGBA1C 10.2 01/06/2016   A:  Patient still having difficult time controlling blood sugars.  His A1c today is unchanged from 3 months ago.  He is taking his medications as directed but will occasionally miss a mid-day dose of his Humalog.  He has tolerated the increased dose of Liraglutide.  He does not drink sugary drinks but is still eating a lot of carbohydrates.  His meter shows 100% of his readings being above goal, with an average CBG of 341.  P: - will continue with current dosing of Lantus 80 units QHS and Humalog 33 units TID. - will continue with Liraglutide 1.8mg  daily. - avoiding metformin given history of lactic acidosis  thought to be due to this medication last year. - will start pioglitazone 30mg  daily.  Pioglitazone is preferred because of the greater concern about atherogenic lipid profiles and a potential increased risk for cardiovascular events with rosiglitazone. - also avoiding use of SGLT-2 inhibitor with his history of PVD and risk of amputation associated with canagliflozin. - he has lipodystrophy present on exam likely from his anti-retrovirals to treat his HIV. I think this is contributing to his difficult to control diabetes as patients with HIV-associated lipodystrophy have abnormal lipid and glucose metabolism, which often leads to insulin resistance.  Lipoatrophy is particularly  associated with NRTI's such as zidovudine, which is part of his regimen. - RTC 3 months.   Essential (primary) hypertension BP Readings from Last 3 Encounters:  01/06/16 (!) 104/52  10/07/15 (!) 122/59  08/06/15 119/62   A:  BP today is 104/52 on lisinopril 40mg  daily.  Patient without any symptoms of hypotension.  P: - continue current medications as prescribed right now.  - if symptoms develop or hypotension persists, can consider decreasing dose of lisinopril.  Health care maintenance A/P: - flu vaccine given today.

## 2016-01-07 LAB — HIV-1 RNA QUANT-NO REFLEX-BLD

## 2016-01-07 NOTE — Assessment & Plan Note (Signed)
Lab Results  Component Value Date   HGBA1C 10.2 01/06/2016   A:  Patient still having difficult time controlling blood sugars.  His A1c today is unchanged from 3 months ago.  He is taking his medications as directed but will occasionally miss a mid-day dose of his Humalog.  He has tolerated the increased dose of Liraglutide.  He does not drink sugary drinks but is still eating a lot of carbohydrates.  His meter shows 100% of his readings being above goal, with an average CBG of 341.  P: - will continue with current dosing of Lantus 80 units QHS and Humalog 33 units TID. - will continue with Liraglutide 1.8mg  daily. - avoiding metformin given history of lactic acidosis thought to be due to this medication last year. - will start pioglitazone 30mg  daily.  Pioglitazone is preferred because of the greater concern about atherogenic lipid profiles and a potential increased risk for cardiovascular events with rosiglitazone. - also avoiding use of SGLT-2 inhibitor with his history of PVD and risk of amputation associated with canagliflozin. - he has lipodystrophy present on exam likely from his anti-retrovirals to treat his HIV. I think this is contributing to his difficult to control diabetes as patients with HIV-associated lipodystrophy have abnormal lipid and glucose metabolism, which often leads to insulin resistance.  Lipoatrophy is particularly associated with NRTI's such as zidovudine, which is part of his regimen. - RTC 3 months.

## 2016-01-07 NOTE — Assessment & Plan Note (Signed)
A/P: - flu vaccine given today.

## 2016-01-07 NOTE — Assessment & Plan Note (Signed)
BP Readings from Last 3 Encounters:  01/06/16 (!) 104/52  10/07/15 (!) 122/59  08/06/15 119/62   A:  BP today is 104/52 on lisinopril 40mg  daily.  Patient without any symptoms of hypotension.  P: - continue current medications as prescribed right now.  - if symptoms develop or hypotension persists, can consider decreasing dose of lisinopril.

## 2016-01-08 LAB — PAIN MGMT, PROFILE 1 W/O CONF, U
Amphetamines: NEGATIVE ng/mL (ref <500)
BARBITURATES: NEGATIVE ng/mL (ref <300)
Benzodiazepines: NEGATIVE ng/mL (ref <100)
COCAINE METABOLITE: NEGATIVE ng/mL (ref <150)
Creatinine: 38.9 mg/dL (ref >=20.0)
METHADONE METABOLITE: NEGATIVE ng/mL (ref <100)
Marijuana Metabolite: NEGATIVE ng/mL (ref <20)
OXYCODONE: NEGATIVE ng/mL (ref <100)
Opiates: NEGATIVE ng/mL (ref <100)
Oxidant: NEGATIVE ug/mL (ref <200)
PHENCYCLIDINE: NEGATIVE ng/mL (ref <25)
pH: 6.32 (ref 4.5–9.0)

## 2016-01-08 NOTE — Progress Notes (Signed)
Case discussed with Dr. Wallace at the time of the visit.  We reviewed the resident's history and exam and pertinent patient test results.  I agree with the assessment, diagnosis and plan of care documented in the resident's note. 

## 2016-01-12 NOTE — Progress Notes (Signed)
I will need to meet with Nathan [name] at his next visit to discuss this issue of no narcotics showing up on his drug screen. He may have run out but then does he really need these meds if he tolerated 9 days with zero meds?

## 2016-01-27 ENCOUNTER — Encounter: Payer: Self-pay | Admitting: Infectious Disease

## 2016-01-27 ENCOUNTER — Ambulatory Visit (INDEPENDENT_AMBULATORY_CARE_PROVIDER_SITE_OTHER): Payer: Medicare Other | Admitting: Infectious Disease

## 2016-01-27 VITALS — BP 128/79 | HR 78 | Temp 97.4°F | Ht 73.0 in | Wt 179.0 lb

## 2016-01-27 DIAGNOSIS — I6523 Occlusion and stenosis of bilateral carotid arteries: Secondary | ICD-10-CM | POA: Diagnosis not present

## 2016-01-27 DIAGNOSIS — Z89612 Acquired absence of left leg above knee: Secondary | ICD-10-CM | POA: Diagnosis not present

## 2016-01-27 DIAGNOSIS — Z89611 Acquired absence of right leg above knee: Secondary | ICD-10-CM

## 2016-01-27 DIAGNOSIS — E1142 Type 2 diabetes mellitus with diabetic polyneuropathy: Secondary | ICD-10-CM | POA: Diagnosis not present

## 2016-01-27 DIAGNOSIS — B20 Human immunodeficiency virus [HIV] disease: Secondary | ICD-10-CM | POA: Diagnosis not present

## 2016-01-27 DIAGNOSIS — G547 Phantom limb syndrome without pain: Secondary | ICD-10-CM

## 2016-01-27 DIAGNOSIS — I70213 Atherosclerosis of native arteries of extremities with intermittent claudication, bilateral legs: Secondary | ICD-10-CM | POA: Diagnosis not present

## 2016-01-27 DIAGNOSIS — I739 Peripheral vascular disease, unspecified: Secondary | ICD-10-CM | POA: Diagnosis not present

## 2016-01-27 NOTE — Progress Notes (Signed)
Chief complaint: followup for HIV on meds  Subjective:    Patient ID: Nathan [name], male    DOB: 29-May-1950, 65 y.o.   MRN: [id number]  HPI  Nathan [name] is a 65 y.o. male whose HIV has been perfectly suppressed with salvage regimen of  twice daily combivir, isentress and once daily viread with undetectable viral load and health cd4 count. (he has EXTENSIVE R with R to all NNRTI, R to nearly all PI except DRV, with still activity from TNF, AZT and the isentress, changed to BID AZT, ISENTRESS, and daily Descovy.  Lab Results  Component Value Date   HIV1RNAQUANT <20 01/05/2016   HIV1RNAQUANT <20 03/17/2015   HIV1RNAQUANT <20 09/09/2014    Lab Results  Component Value Date   CD4TABS 940 01/05/2016   CD4TABS 630 03/17/2015   CD4TABS 550 09/09/2014    Nathan [name] also has comorbid PVD and underwent an above-the-knee and dictation was stenting of his vessels  We spent great deal of appt discussing what happened with his narcotics. He ran out of meds apparently on August 23rd. He was told I was out of town and meds not filled. When he came to have meds filled and narcotics were dispensed his urine tox screen was -. I was concerned whether he was not taking his meds. However after thorough discussion today he was off meds for at least 6-8 days and I pulled his rx fill data from [place] controlled database.    Past Medical History:  Diagnosis Date  . Asthma    per 2003 [facility] pulm records pfts   . Blood dyscrasia    HIV  . Clotting disorder ([place])   . Colon polyps    noted previous colonoscopy [facility]  . DDD (degenerative disc disease)    cervical spine  . Depression   . Diabetes mellitus    dx 2010  . Fever    unknwon origin  . GERD (gastroesophageal reflux disease)   . Hep C w/o coma, chronic ([place])   . History of syphilis    noted [facility] records  . HIV infection ([place])    undetectable viral load and CD4 ct 667 as of 11/2011  . Hyperlipidemia   . Hypertension   . MRSA (methicillin  resistant Staphylococcus aureus)   . Multiple sclerosis ([place])    in remission as of 11/2011 (diagnosed late 1980s)  . Pain in limb-Right Leg 10/13/2013  . 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[place])    2002  . Pneumonia   . PVD (peripheral vascular disease) ([place])    Left Stent 07/02/2008  . UTI (urinary tract infection)     Past Surgical History:  Procedure Laterality Date  . ABOVE KNEE LEG AMPUTATION     Right Leg  . LOWER EXTREMITY ANGIOGRAM Left 12/26/2011   Procedure: LOWER EXTREMITY ANGIOGRAM;  Surgeon: Serafina Mitchell, MD;  Location: [facility] CATH LAB;  Service: Cardiovascular;  Laterality: Left;  . OTHER SURGICAL HISTORY     right lower ext AKA with prothesis   . OTHER SURGICAL HISTORY     left left with stents (Dr. Seward Speck)  . OTHER SURGICAL HISTORY     2003 colonoscopy 5 mm polyp transverse colon; (2)42m  polyps in rectum-hyperplastic    Family History  Problem Relation Age of Onset  . Diabetes Mother   . Coronary artery disease Mother   . Cancer Brother     colon caner stage 4 as of 11/2011 (unknown age of onset)  . Prostate cancer  Brother   . Coronary artery disease Father       Social History   Social History  . Marital status: Single    Spouse name: N/A  . Number of children: N/A  . Years of education: 59   Social History Main Topics  . Smoking status: Former Smoker    Quit date: 05/09/2007  . Smokeless tobacco: Never Used  . Alcohol use No  . Drug use: No  . Sexual activity: Not Asked   Other Topics Concern  . None   Social History Narrative  . None    Allergies  Allergen Reactions  . Sulfonamide Derivatives Hives     Current Outpatient Prescriptions:  .  atorvastatin (LIPITOR) 40 MG tablet, Take 1 tablet (40 mg total) by mouth daily., Disp: 90 tablet, Rfl: 3 .  Blood Glucose Monitoring Suppl (BAYER CONTOUR MONITOR) W/DEVICE KIT, Use to check blood sugar as instructed up to 4 times a day, Disp: 1 kit, Rfl: 0 .  clopidogrel (PLAVIX) 75 MG  tablet, Take 1 tablet (75 mg total) by mouth daily., Disp: 90 tablet, Rfl: 3 .  emtricitabine-tenofovir AF (DESCOVY) 200-25 MG tablet, Take 1 tablet by mouth daily., Disp: 30 tablet, Rfl: 11 .  glucose blood (BAYER CONTOUR NEXT TEST) test strip, Four times daily before meals and at bedtime, insulin dependent, ICD-10 CODE E11.42, Disp: 120 each, Rfl: 11 .  ibuprofen (ADVIL,MOTRIN) 200 MG tablet, Take 400-600 mg by mouth every 6 (six) hours as needed., Disp: , Rfl:  .  Insulin Glargine (LANTUS SOLOSTAR) 100 UNIT/ML Solostar Pen, Inject 80 Units into the skin daily at 10 pm., Disp: 15 mL, Rfl: 11 .  insulin lispro (HUMALOG KWIKPEN) 100 UNIT/ML KiwkPen, Inject 0.33 mLs (33 Units total) into the skin 3 (three) times daily with meals., Disp: 30 mL, Rfl: 11 .  Insulin Pen Needle (BD PEN NEEDLE NANO U/F) 32G X 4 MM MISC, USE AS DIRECTED FOUR TIMES DAILY., Disp: 100 each, Rfl: 11 .  ISENTRESS 400 MG tablet, TAKE 1 TABLET(400 MG) BY MOUTH TWICE DAILY, Disp: 60 tablet, Rfl: 6 .  Lancet Devices (BAYER MICROLET 2 LANCING DEVIC) MISC, Use to check blood sugar up to 3 ties a day, Disp: 1 each, Rfl: 2 .  lisinopril (PRINIVIL,ZESTRIL) 40 MG tablet, TAKE 1 TABLET(40 MG) BY MOUTH DAILY, Disp: 90 tablet, Rfl: 3 .  omeprazole (PRILOSEC) 40 MG capsule, TAKE ONE CAPSULE BY MOUTH EVERY DAY, Disp: 90 capsule, Rfl: 1 .  Oxycodone HCl 10 MG TABS, Take 1 tablet (10 mg total) by mouth 3 (three) times daily as needed., Disp: 90 tablet, Rfl: 0 .  pioglitazone (ACTOS) 30 MG tablet, Take 1 tablet (30 mg total) by mouth daily., Disp: 30 tablet, Rfl: 2 .  VICTOZA 18 MG/3ML SOPN, INJECT 1.8 MG UNDER THE SKIN EVERY MORNING, Disp: 9 mL, Rfl: 3 .  zidovudine (RETROVIR) 300 MG tablet, Take 1 tablet (300 mg total) by mouth 2 (two) times daily., Disp: 60 tablet, Rfl: 11 .  Lancets MISC, Use to Test Blood Sugar Three Times Daily. Dx Code: 250.02, Disp: 100 each, Rfl: 3 .  pregabalin (LYRICA) 100 MG capsule, Take 1 capsule (100 mg total) by  mouth 3 (three) times daily. (Patient not taking: Reported on 01/27/2016), Disp: 90 capsule, Rfl: 2  Review of Systems  Constitutional: Negative for activity change, appetite change, chills, diaphoresis and unexpected weight change.  HENT: Negative for sinus pressure, sneezing and trouble swallowing.   Eyes: Negative for photophobia and visual  disturbance.  Respiratory: Negative for chest tightness and stridor.   Cardiovascular: Negative for palpitations and leg swelling.  Gastrointestinal: Negative for abdominal distention, anal bleeding, blood in stool and constipation.  Genitourinary: Negative for difficulty urinating, dysuria, flank pain and hematuria.  Musculoskeletal: Positive for arthralgias and myalgias. Negative for back pain, gait problem and joint swelling.  Skin: Negative for color change, pallor and wound.  Neurological: Negative for dizziness, tremors, weakness and light-headedness.  Hematological: Negative for adenopathy. Does not bruise/bleed easily.  Psychiatric/Behavioral: Negative for agitation, behavioral problems, confusion, decreased concentration, dysphoric mood and sleep disturbance.       Objective:   Physical Exam  Constitutional: He is oriented to person, place, and time. He appears well-developed and well-nourished. No distress.  HENT:  Head: Normocephalic and atraumatic.  Mouth/Throat: Oropharynx is clear and moist. No oropharyngeal exudate.  Eyes: Conjunctivae and EOM are normal. No scleral icterus.  Neck: Normal range of motion. Neck supple.  Cardiovascular: Normal rate and regular rhythm.   Pulmonary/Chest: Effort normal. No respiratory distress. He has no wheezes.  Abdominal: He exhibits no distension.  Musculoskeletal: He exhibits no edema or tenderness.  Neurological: He is alert and oriented to person, place, and time.  Skin: Skin is warm and dry. He is not diaphoretic. No erythema. No pallor.  Psychiatric: His behavior is normal. Judgment and  thought content normal. He exhibits a depressed mood.        Assessment & Plan:    HIV: highly R virus.   Continue BID Isentress (offered QD ISENTRESS HD 1273m but preferred current regimen)  with daily Descovy (for better bone, kidney safety) and  BID AZT  Disseminated syphilis with hepatitis: NON REAC (08/30 1138) resolved   DM: followed closely in IM clinic  PVD followed by [facility]  HTN: followed by PCP  Phantom limb pain: I believe that he was truly out of meds. I am OK to fill his narcotics on September 30th with tox screen.    I spent greater than 40 minutes with the patient including greater than 50% of time in face to face counsel of the patient  His HIV, his new ARV regimen his PVD, diabetes mellitus and hypertension and phantom limb pain and in coordination of their care.

## 2016-03-24 ENCOUNTER — Encounter: Payer: Self-pay | Admitting: *Deleted

## 2016-03-27 ENCOUNTER — Other Ambulatory Visit: Payer: Self-pay | Admitting: Internal Medicine

## 2016-03-27 DIAGNOSIS — E1142 Type 2 diabetes mellitus with diabetic polyneuropathy: Secondary | ICD-10-CM

## 2016-03-29 ENCOUNTER — Other Ambulatory Visit: Payer: Self-pay | Admitting: Internal Medicine

## 2016-03-29 DIAGNOSIS — E1142 Type 2 diabetes mellitus with diabetic polyneuropathy: Secondary | ICD-10-CM

## 2016-04-10 ENCOUNTER — Other Ambulatory Visit: Payer: Self-pay | Admitting: Infectious Disease

## 2016-04-13 ENCOUNTER — Encounter: Payer: Self-pay | Admitting: Internal Medicine

## 2016-04-24 ENCOUNTER — Other Ambulatory Visit: Payer: Self-pay | Admitting: Infectious Disease

## 2016-04-25 ENCOUNTER — Other Ambulatory Visit: Payer: Self-pay | Admitting: *Deleted

## 2016-04-25 DIAGNOSIS — B20 Human immunodeficiency virus [HIV] disease: Secondary | ICD-10-CM

## 2016-04-25 MED ORDER — RALTEGRAVIR POTASSIUM 400 MG PO TABS: ORAL_TABLET | ORAL | 6 refills | Status: DC

## 2016-04-25 MED ORDER — ZIDOVUDINE 300 MG PO TABS: 300.0000 mg | ORAL_TABLET | Freq: Two times a day (BID) | ORAL | 5 refills | Status: DC

## 2016-05-08 ENCOUNTER — Other Ambulatory Visit: Payer: Self-pay | Admitting: Internal Medicine

## 2016-05-08 DIAGNOSIS — E1142 Type 2 diabetes mellitus with diabetic polyneuropathy: Secondary | ICD-10-CM

## 2016-05-09 ENCOUNTER — Other Ambulatory Visit: Payer: Self-pay | Admitting: Infectious Disease

## 2016-05-12 ENCOUNTER — Encounter: Payer: Self-pay | Admitting: Internal Medicine

## 2016-05-18 ENCOUNTER — Other Ambulatory Visit: Payer: Self-pay | Admitting: Internal Medicine

## 2016-05-25 ENCOUNTER — Encounter: Payer: Self-pay | Admitting: Internal Medicine

## 2016-06-14 ENCOUNTER — Telehealth: Payer: Self-pay | Admitting: Internal Medicine

## 2016-06-14 NOTE — Telephone Encounter (Signed)
APT. REMINDER CALL, LMTCB °

## 2016-06-15 ENCOUNTER — Encounter: Payer: Self-pay | Admitting: Internal Medicine

## 2016-06-15 ENCOUNTER — Ambulatory Visit (INDEPENDENT_AMBULATORY_CARE_PROVIDER_SITE_OTHER): Payer: Medicare Other | Admitting: Internal Medicine

## 2016-06-15 ENCOUNTER — Encounter (INDEPENDENT_AMBULATORY_CARE_PROVIDER_SITE_OTHER): Payer: Self-pay

## 2016-06-15 VITALS — BP 135/66 | HR 77 | Temp 97.8°F | Ht 73.0 in | Wt 183.1 lb

## 2016-06-15 DIAGNOSIS — B20 Human immunodeficiency virus [HIV] disease: Secondary | ICD-10-CM

## 2016-06-15 DIAGNOSIS — Z89611 Acquired absence of right leg above knee: Secondary | ICD-10-CM

## 2016-06-15 DIAGNOSIS — E1142 Type 2 diabetes mellitus with diabetic polyneuropathy: Secondary | ICD-10-CM | POA: Diagnosis not present

## 2016-06-15 DIAGNOSIS — Z87891 Personal history of nicotine dependence: Secondary | ICD-10-CM | POA: Diagnosis not present

## 2016-06-15 DIAGNOSIS — Z21 Asymptomatic human immunodeficiency virus [HIV] infection status: Secondary | ICD-10-CM | POA: Diagnosis not present

## 2016-06-15 DIAGNOSIS — F1120 Opioid dependence, uncomplicated: Secondary | ICD-10-CM

## 2016-06-15 DIAGNOSIS — Z794 Long term (current) use of insulin: Secondary | ICD-10-CM | POA: Diagnosis not present

## 2016-06-15 DIAGNOSIS — I10 Essential (primary) hypertension: Secondary | ICD-10-CM | POA: Diagnosis not present

## 2016-06-15 DIAGNOSIS — E881 Lipodystrophy, not elsewhere classified: Secondary | ICD-10-CM | POA: Diagnosis not present

## 2016-06-15 LAB — POCT GLYCOSYLATED HEMOGLOBIN (HGB A1C): Hemoglobin A1C: 7.6

## 2016-06-15 LAB — GLUCOSE, CAPILLARY: Glucose-Capillary: 235 mg/dL — ABNORMAL HIGH (ref 65–99)

## 2016-06-15 NOTE — Progress Notes (Signed)
CC: here for DM follow up  HPI:  [name] is a 66 y.o. man with a past medical history listed below here today for follow up of his HTN and DM.   For details of today's visit and the status of his chronic medical issues please refer to the assessment and plan.   Past Medical History:  Diagnosis Date  . Asthma    per 2003 [facility] pulm records pfts   . Blood dyscrasia    HIV  . Clotting disorder ([place])   . Colon polyps    noted previous colonoscopy [facility]  . DDD (degenerative disc disease)    cervical spine  . Depression   . Diabetes mellitus    dx 2010  . Fever    unknwon origin  . GERD (gastroesophageal reflux disease)   . Hep C w/o coma, chronic ([place])   . History of syphilis    noted [facility] records  . HIV infection ([place])    undetectable viral load and CD4 ct 667 as of 11/2011  . Hyperlipidemia   . Hypertension   . MRSA (methicillin resistant Staphylococcus aureus)   . Multiple sclerosis ([place])    in remission as of 11/2011 (diagnosed late 1980s)  . Pain in limb-Right Leg 10/13/2013  . 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[place])    2002  . Pneumonia   . PVD (peripheral vascular disease) ([place])    Left Stent 07/02/2008  . UTI (urinary tract infection)     Review of Systems:  Please see pertinent ROS reviewed in HPI and problem based charting.   Physical Exam:  Vitals:   06/15/16 1521  BP: 135/66  Pulse: 77  Temp: 97.8 F (36.6 C)  TempSrc: Oral  SpO2: 99%  Weight: 183 lb 1.6 oz (83.1 kg)  Height: 6\' 1"  (1.854 m)   Physical Exam  Constitutional: He is oriented to person, place, and time and well-developed, well-nourished, and in no distress.  HENT:  Head: Normocephalic and atraumatic.  Lipodystrophy present.  Eyes: Conjunctivae and EOM are normal.  Cardiovascular: Normal rate and regular rhythm.   Pulmonary/Chest: Effort normal and breath sounds normal.  Musculoskeletal:  Right AKA. Diminished DP and PT pulses on left.   Dry skin on left LE  without cracking or open wound.  Neurological: He is alert and oriented to person, place, and time.  Skin: Skin is dry.  Psychiatric: Mood and affect normal.     Assessment & Plan:   See Encounters Tab for problem based charting.  Patient discussed with Dr. Daryll Drown .  Essential (primary) hypertension BP Readings from Last 3 Encounters:  06/15/16 135/66  01/27/16 128/79  01/06/16 (!) 104/52   A: Good control on monotherapy with lisinopril 40mg  daily.  No lightheadedness, headaches, blurry vision or other concerns related to BP control.  P: - continue current management.  Previously his BP has been less than [id number] systolic and today he is just above that.  I think a goal of less than 130 is reasonable for him given his other comorbidities.  DM type 2 with diabetic peripheral neuropathy [facility]) Lab Results  Component Value Date   HGBA1C 7.6 06/15/2016   A: He has had significant improvement in A1c since last check.  Down from 10.2 to 7.6 today.  Currently on Lantus 80units QHS, Humalog 33 units TID, Liraglutide 1.8mg  daily, and pioglitazone 30mg  daily.  He is tolerating this regimen well per his report and his satisfied with his A1c progress.  P: -  continue current regimen - RTC 3 months to repeat A1c and ensure still making progress  Opioid dependence (HCC) A: Reports pain is currently well controlled and has actually not taken Oxycodone or Lyrica in many months.  Asking to have them removed from medication list.  In the past Oxycodone has been filled by his providers at [facility].  P: - remove Lyrica and Oxycodone from medication list.  Human immunodeficiency virus (HIV) disease A/P: Follows with Dr. Tommy Medal at [facility]  Has had good control.

## 2016-06-15 NOTE — Patient Instructions (Signed)
Thank you for coming to see me today. It was a pleasure. Today we talked about:   Diabetes: keep up the great work.  Your A1C is now 7.6 which is very much an improvement from last time.  I have removed the medications you are no longer taking from your medication list.  Please follow-up with me in 3 months.  If you have any questions or concerns, please do not hesitate to call the office at [phone].  Take Care,   Jule Ser, DO

## 2016-06-16 NOTE — Assessment & Plan Note (Signed)
BP Readings from Last 3 Encounters:  06/15/16 135/66  01/27/16 128/79  01/06/16 (!) 104/52   A: Good control on monotherapy with lisinopril 40mg  daily.  No lightheadedness, headaches, blurry vision or other concerns related to BP control.  P: - continue current management.  Previously his BP has been less than [id number] systolic and today he is just above that.  I think a goal of less than 130 is reasonable for him given his other comorbidities.

## 2016-06-16 NOTE — Assessment & Plan Note (Signed)
Lab Results  Component Value Date   HGBA1C 7.6 06/15/2016   A: He has had significant improvement in A1c since last check.  Down from 10.2 to 7.6 today.  Currently on Lantus 80units QHS, Humalog 33 units TID, Liraglutide 1.8mg  daily, and pioglitazone 30mg  daily.  He is tolerating this regimen well per his report and his satisfied with his A1c progress.  P: - continue current regimen - RTC 3 months to repeat A1c and ensure still making progress

## 2016-06-16 NOTE — Assessment & Plan Note (Signed)
A: Reports pain is currently well controlled and has actually not taken Oxycodone or Lyrica in many months.  Asking to have them removed from medication list.  In the past Oxycodone has been filled by his providers at [facility].  P: - remove Lyrica and Oxycodone from medication list.

## 2016-06-16 NOTE — Assessment & Plan Note (Signed)
A/P: Follows with Dr. Tommy Medal at [facility].  Has had good control.

## 2016-06-25 ENCOUNTER — Other Ambulatory Visit: Payer: Self-pay | Admitting: Internal Medicine

## 2016-06-25 DIAGNOSIS — E1142 Type 2 diabetes mellitus with diabetic polyneuropathy: Secondary | ICD-10-CM

## 2016-06-27 ENCOUNTER — Encounter: Payer: Self-pay | Admitting: Infectious Disease

## 2016-06-28 NOTE — Progress Notes (Signed)
Internal Medicine Clinic Attending  Case discussed with Dr. Wallace soon after the resident saw the patient.  We reviewed the resident's history and exam and pertinent patient test results.  I agree with the assessment, diagnosis, and plan of care documented in the resident's note. 

## 2016-07-03 ENCOUNTER — Other Ambulatory Visit: Payer: Self-pay | Admitting: Internal Medicine

## 2016-07-03 DIAGNOSIS — E1142 Type 2 diabetes mellitus with diabetic polyneuropathy: Secondary | ICD-10-CM

## 2016-07-07 ENCOUNTER — Encounter: Payer: Self-pay | Admitting: Surgery

## 2016-07-17 ENCOUNTER — Ambulatory Visit (HOSPITAL_COMMUNITY)
Admission: RE | Admit: 2016-07-17 | Discharge: 2016-07-17 | Disposition: A | Discharge status: Home / Self Care | Payer: Medicare Other | Source: Ambulatory Visit | Attending: Surgery | Admitting: Surgery

## 2016-07-17 ENCOUNTER — Ambulatory Visit (INDEPENDENT_AMBULATORY_CARE_PROVIDER_SITE_OTHER)
Admission: RE | Admit: 2016-07-17 | Discharge: 2016-07-17 | Disposition: A | Discharge status: Home / Self Care | Payer: Medicare Other | Source: Ambulatory Visit | Attending: Surgery | Admitting: Surgery

## 2016-07-17 ENCOUNTER — Ambulatory Visit (INDEPENDENT_AMBULATORY_CARE_PROVIDER_SITE_OTHER): Payer: Medicare Other | Admitting: Vascular Surgery

## 2016-07-17 VITALS — BP 135/75 | HR 76 | Temp 97.1°F | Resp 20 | Ht 73.0 in | Wt 180.9 lb

## 2016-07-17 DIAGNOSIS — I739 Peripheral vascular disease, unspecified: Secondary | ICD-10-CM | POA: Insufficient documentation

## 2016-07-17 DIAGNOSIS — I6523 Occlusion and stenosis of bilateral carotid arteries: Secondary | ICD-10-CM

## 2016-07-17 LAB — VAS US CAROTID
LCCADDIAS: 29 cm/s
LCCADSYS: 108 cm/s
LCCAPSYS: 125 cm/s
LEFT ECA DIAS: -21 cm/s
LEFT VERTEBRAL DIAS: -23 cm/s
LICADSYS: -117 cm/s
Left CCA prox dias: 30 cm/s
Left ICA dist dias: -37 cm/s
RCCAPSYS: 125 cm/s
RIGHT CCA MID DIAS: -22 cm/s
RIGHT ECA DIAS: 26 cm/s
RIGHT VERTEBRAL DIAS: 12 cm/s
Right CCA prox dias: 18 cm/s
Right cca dist sys: -108 cm/s

## 2016-07-17 NOTE — Progress Notes (Signed)
[facility]  Patient name: Nathan [name] MRN: [id number] DOB: May 21, 1950 Sex: male  REASON FOR VISIT: follow-up  HPI: Nathan [name] is a 66 y.o. male who presents for continued follow-up of his peripheral vascular disease and carotid disease. He previously underwent left SFA stenting in 2010. He is s/p right AKA 9 years ago. He ambulates with a prosthesis. He describes a burning sensation in his right leg around his calf intermittently. He denies any actual calf claudication. He denies any non healing wounds.   The patient denies any amaurosis fugax. He does describe intermittent bilateral blurriness that resolves with blinking his eyes a few times over the past several years. He denies any loss of vision. He denies any sudden weakness or numbness on one half of his body and expressive/receptive aphasia.   He is a previous smoker. He is diabetic on insulin, actos and victoza. He is on plavix and takes a statin daily for hyperlipidemia. His hypertension is managed on lisinopril.   Past Medical History:  Diagnosis Date  . Asthma    per 2003 [facility] pulm records pfts   . Blood dyscrasia    HIV  . Clotting disorder ([place])   . Colon polyps    noted previous colonoscopy [facility]  . DDD (degenerative disc disease)    cervical spine  . Depression   . Diabetes mellitus    dx 2010  . Fever    unknwon origin  . GERD (gastroesophageal reflux disease)   . Hep C w/o coma, chronic ([place])   . History of syphilis    noted [facility] records  . HIV infection ([place])    undetectable viral load and CD4 ct 667 as of 11/2011  . Hyperlipidemia   . Hypertension   . MRSA (methicillin resistant Staphylococcus aureus)   . Multiple sclerosis ([place])    in remission as of 11/2011 (diagnosed late 1980s)  . Pain in limb-Right Leg 10/13/2013  . 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[place])    2002  . Pneumonia   . PVD (peripheral vascular disease) ([place])    Left Stent 07/02/2008  . UTI  (urinary tract infection)     Family History  Problem Relation Age of Onset  . Diabetes Mother   . Coronary artery disease Mother   . Cancer Brother     colon caner stage 4 as of 11/2011 (unknown age of onset)  . Coronary artery disease Father   . Prostate cancer Brother     SOCIAL HISTORY: Social History  Substance Use Topics  . Smoking status: Former Smoker    Quit date: 05/09/2007  . Smokeless tobacco: Never Used  . Alcohol use No    Allergies  Allergen Reactions  . Sulfonamide Derivatives Hives    Current Outpatient Prescriptions  Medication Sig Dispense Refill  . atorvastatin (LIPITOR) 40 MG tablet Take 1 tablet (40 mg total) by mouth daily. 90 tablet 3  . Blood Glucose Monitoring Suppl (BAYER CONTOUR MONITOR) W/DEVICE KIT Use to check blood sugar as instructed up to 4 times a day 1 kit 0  . clopidogrel (PLAVIX) 75 MG tablet Take 1 tablet (75 mg total) by mouth daily. 90 tablet 3  . DESCOVY 200-25 MG tablet TAKE 1 TABLET BY MOUTH DAILY 30 tablet 5  . glucose blood (BAYER CONTOUR NEXT TEST) test strip Four times daily before meals and at bedtime, insulin dependent, ICD-10 CODE E11.42 120 each 11  . ibuprofen (ADVIL,MOTRIN) 200 MG tablet Take 400-600  mg by mouth every 6 (six) hours as needed.    . Insulin Glargine (LANTUS SOLOSTAR) 100 UNIT/ML Solostar Pen Inject 80 Units into the skin daily at 10 pm. 15 mL 11  . insulin lispro (HUMALOG KWIKPEN) 100 UNIT/ML KiwkPen Inject 0.33 mLs (33 Units total) into the skin 3 (three) times daily with meals. 30 mL 11  . Insulin Pen Needle (BD PEN NEEDLE NANO U/F) 32G X 4 MM MISC USE AS DIRECTED FOUR TIMES DAILY. 100 each 11  . Lancet Devices (BAYER MICROLET 2 LANCING DEVIC) MISC Use to check blood sugar up to 3 ties a day 1 each 2  . lisinopril (PRINIVIL,ZESTRIL) 40 MG tablet TAKE 1 TABLET(40 MG) BY MOUTH DAILY 90 tablet 3  . omeprazole (PRILOSEC) 40 MG capsule TAKE ONE CAPSULE BY MOUTH EVERY DAY 90 capsule 0  . pioglitazone (ACTOS) 30 MG  tablet TAKE 1 TABLET BY MOUTH EVERY DAY 90 tablet 1  . raltegravir (ISENTRESS) 400 MG tablet TAKE 1 TABLET(400 MG) BY MOUTH TWICE DAILY 60 tablet 6  . VICTOZA 18 MG/3ML SOPN INJECT 1.8MG UNDER THE SKIN EVERY MORNING 9 mL 0  . zidovudine (RETROVIR) 300 MG tablet Take 1 tablet (300 mg total) by mouth 2 (two) times daily. 60 tablet 5  . Lancets MISC Use to Test Blood Sugar Three Times Daily. Dx Code: 250.02 100 each 3   No current facility-administered medications for this visit.     REVIEW OF SYSTEMS:  '[X]'  denotes positive finding, '[ ]'  denotes negative finding Cardiac  Comments:  Chest pain or chest pressure:    Shortness of breath upon exertion:    Short of breath when lying flat:    Irregular heart rhythm:        Vascular    Pain in calf, thigh, or hip brought on by ambulation:    Pain in feet at night that wakes you up from your sleep:     Blood clot in your veins:    Leg swelling:         Pulmonary    Oxygen at home:    Productive cough:     Wheezing:         Neurologic    Sudden weakness in arms or legs:     Sudden numbness in arms or legs:     Sudden onset of difficulty speaking or slurred speech:    Temporary loss of vision in one eye:     Problems with dizziness:         Gastrointestinal    Blood in stool:     Vomited blood:         Genitourinary    Burning when urinating:     Blood in urine:        Psychiatric    Major depression:         Hematologic    Bleeding problems:    Problems with blood clotting too easily:        Skin    Rashes or ulcers:        Constitutional    Fever or chills:      PHYSICAL EXAM: Vitals:   07/17/16 1442 07/17/16 1445  BP: 134/76 135/75  Pulse: 76   Resp: 20   Temp: 97.1 F (36.2 C)   TempSrc: Oral   SpO2: 98%   Weight: 180 lb 14.4 oz (82.1 kg)   Height: '6\' 1"'  (1.854 m)     GENERAL: The patient is a well-nourished male, in  no acute distress. The vital signs are documented above. CARDIAC: There is a regular  rate and rhythm. No carotid bruis. VASCULAR: Right AKA. Non palpable left pedal pulses left foot is warm and well perfused.  PULMONARY: There is good air exchange bilaterally without wheezing or rales. MUSCULOSKELETAL: Right AKA NEUROLOGIC: No focal deficits.  SKIN: There are no ulcers or rashes noted. PSYCHIATRIC: The patient has a normal affect.  DATA:  Carotid duplex 07/17/16  60-79% bilateral internal carotid artery stenosis  Lower extremity arterial duplex and ABIs 07/17/16  Right AKA Left: Widely patent left SFA/popliteal stent with triphasic waveforms throughout. ABI:  0.85   MEDICAL ISSUES: Peripheral arterial disease  Left SFA stent is patent and ABIs stable. Continues to have some neuropathic type symptoms in the left leg and foot. Ambulating well with right leg prosthesis. Continue maximal medical management with diabetes control, plavix and statin. Follow up in one year with repeat ABIs and left leg arterial duplex.   60-79% bilateral carotid stenosis  The patient has been asymptomatic. His intermittent bilateral eye blurriness resolves with blinking and has been unchanged for the past several years. Does not appear to be amaurosis fugax. Did discuss with Dr. Trula Slade. Discussed that he is below the 80% threshold for surgical intervention. Will follow up in 6 months with repeat carotid duplex. The patient knows to seek emergency assistance if he develops any TIA or stroke symptoms.   Virgina Jock, PA-C [facility] MD: Trula Slade

## 2016-07-21 NOTE — Addendum Note (Signed)
Addended by: Lianne Cure A on: 07/21/2016 02:54 PM   Modules accepted: Orders

## 2016-07-26 ENCOUNTER — Other Ambulatory Visit: Payer: Self-pay | Admitting: Internal Medicine

## 2016-07-26 DIAGNOSIS — E1142 Type 2 diabetes mellitus with diabetic polyneuropathy: Secondary | ICD-10-CM

## 2016-08-16 ENCOUNTER — Other Ambulatory Visit: Payer: Self-pay | Admitting: Internal Medicine

## 2016-08-30 ENCOUNTER — Other Ambulatory Visit: Payer: Self-pay | Admitting: Internal Medicine

## 2016-08-30 DIAGNOSIS — E1165 Type 2 diabetes mellitus with hyperglycemia: Principal | ICD-10-CM

## 2016-08-30 DIAGNOSIS — E1142 Type 2 diabetes mellitus with diabetic polyneuropathy: Secondary | ICD-10-CM

## 2016-08-30 DIAGNOSIS — IMO0002 Reserved for concepts with insufficient information to code with codable children: Secondary | ICD-10-CM

## 2016-09-13 ENCOUNTER — Telehealth: Payer: Self-pay | Admitting: Internal Medicine

## 2016-09-13 NOTE — Telephone Encounter (Signed)
LMOM for appt with Dr. Juleen China on 09/14/2016.

## 2016-09-14 ENCOUNTER — Encounter: Payer: Self-pay | Admitting: Internal Medicine

## 2016-09-25 NOTE — Progress Notes (Signed)
error 

## 2016-10-03 ENCOUNTER — Other Ambulatory Visit: Payer: Self-pay | Admitting: *Deleted

## 2016-10-03 ENCOUNTER — Other Ambulatory Visit: Payer: Self-pay | Admitting: Infectious Disease

## 2016-10-03 DIAGNOSIS — B20 Human immunodeficiency virus [HIV] disease: Secondary | ICD-10-CM

## 2016-10-03 MED ORDER — EMTRICITABINE-TENOFOVIR AF 200-25 MG PO TABS: 1.0000 | ORAL_TABLET | Freq: Every day | ORAL | 5 refills | Status: DC

## 2016-10-03 MED ORDER — RALTEGRAVIR POTASSIUM 400 MG PO TABS: ORAL_TABLET | ORAL | 5 refills | Status: DC

## 2016-10-03 MED ORDER — ZIDOVUDINE 300 MG PO TABS: 300.0000 mg | ORAL_TABLET | Freq: Two times a day (BID) | ORAL | 5 refills | Status: DC

## 2016-10-05 ENCOUNTER — Encounter: Payer: Self-pay | Admitting: Internal Medicine

## 2016-10-15 ENCOUNTER — Other Ambulatory Visit: Payer: Self-pay | Admitting: Internal Medicine

## 2016-10-15 DIAGNOSIS — E1142 Type 2 diabetes mellitus with diabetic polyneuropathy: Secondary | ICD-10-CM

## 2016-10-23 ENCOUNTER — Other Ambulatory Visit: Payer: Self-pay | Admitting: Infectious Disease

## 2016-10-23 DIAGNOSIS — B20 Human immunodeficiency virus [HIV] disease: Secondary | ICD-10-CM

## 2016-10-23 MED ORDER — EMTRICITABINE-TENOFOVIR AF 200-25 MG PO TABS: 1.0000 | ORAL_TABLET | Freq: Every day | ORAL | 5 refills | Status: DC

## 2016-10-23 MED ORDER — RALTEGRAVIR POTASSIUM 400 MG PO TABS: ORAL_TABLET | ORAL | 5 refills | Status: DC

## 2016-10-26 ENCOUNTER — Encounter: Payer: Self-pay | Admitting: Internal Medicine

## 2016-10-26 ENCOUNTER — Ambulatory Visit (INDEPENDENT_AMBULATORY_CARE_PROVIDER_SITE_OTHER): Payer: Medicare Other | Admitting: Internal Medicine

## 2016-10-26 VITALS — BP 140/70 | HR 76 | Temp 97.4°F | Wt 184.0 lb

## 2016-10-26 DIAGNOSIS — Z79899 Other long term (current) drug therapy: Secondary | ICD-10-CM

## 2016-10-26 DIAGNOSIS — Z87891 Personal history of nicotine dependence: Secondary | ICD-10-CM | POA: Diagnosis not present

## 2016-10-26 DIAGNOSIS — I10 Essential (primary) hypertension: Secondary | ICD-10-CM

## 2016-10-26 DIAGNOSIS — Z794 Long term (current) use of insulin: Secondary | ICD-10-CM | POA: Diagnosis not present

## 2016-10-26 DIAGNOSIS — E1142 Type 2 diabetes mellitus with diabetic polyneuropathy: Secondary | ICD-10-CM

## 2016-10-26 DIAGNOSIS — E119 Type 2 diabetes mellitus without complications: Secondary | ICD-10-CM | POA: Diagnosis present

## 2016-10-26 LAB — HM DIABETES EYE EXAM

## 2016-10-26 LAB — GLUCOSE, CAPILLARY: GLUCOSE-CAPILLARY: 159 mg/dL — AB (ref 65–99)

## 2016-10-26 LAB — POCT GLYCOSYLATED HEMOGLOBIN (HGB A1C): HEMOGLOBIN A1C: 5.6

## 2016-10-26 MED ORDER — INSULIN LISPRO 100 UNIT/ML (KWIKPEN): 29.0000 [IU] | PEN_INJECTOR | Freq: Three times a day (TID) | SUBCUTANEOUS | 11 refills | Status: DC

## 2016-10-26 NOTE — Patient Instructions (Addendum)
Thank you for coming to see me today. It was a pleasure. Today we talked about:   Diabetes: Keep up the great work.  We have decreased your Humalog to 29 units three times per day.  Everything else is the same.  We will monitor your blood pressure.  No changes today.  Please follow-up with me  in 3 months.  If you have any questions or concerns, please do not hesitate to call the office at [phone].  Take Care,   Jule Ser, DO

## 2016-10-26 NOTE — Assessment & Plan Note (Signed)
Lab Results  Component Value Date   HGBA1C 5.6 10/26/2016    Assessment: His A1c has continued to decrease from 10.2 >> 7.6 >> 5.6 today on current regimen.  He has not experienced any symptoms of hypoglycemia.  He has on occasion held his dose of Humalog if his CBG is less than 100.  Plan: - continue Lantus 80 units QHS - continue liraglutide 1.8mg  QDay - continue pioglitazone 30mg  Qday - in an attempt to even out his basal - bolus insulin dosing, will decrease his Humalog to 29units TID - RTC 3 months for repeat A1c.  Consider further titration of bolus insulin at that time.

## 2016-10-26 NOTE — Assessment & Plan Note (Signed)
BP Readings from Last 3 Encounters:  10/26/16 140/70  07/17/16 135/75  06/15/16 135/66   Assessment: BP today is slightly above goal of less than 130.  He is only on monotherapy with lisinopril 40mg  daily.  No headaches, vision change, CP.  Plan: - continue current management - RTC 3 months.  Consider addition of 2nd agent if still above goal.

## 2016-10-26 NOTE — Progress Notes (Signed)
   CC: here for DM and HTN  HPI:  [name] is a 66 y.o. man with a past medical history listed below here today for follow up of his DM and HTN.   For details of today's visit and the status of his chronic medical issues please refer to the assessment and plan.   Past Medical History:  Diagnosis Date  . Asthma    per 2003 [facility] pulm records pfts   . Blood dyscrasia    HIV  . Clotting disorder ([place])   . Colon polyps    noted previous colonoscopy [facility]  . DDD (degenerative disc disease)    cervical spine  . Depression   . Diabetes mellitus    dx 2010  . Fever    unknwon origin  . GERD (gastroesophageal reflux disease)   . Hep C w/o coma, chronic ([place])   . History of syphilis    noted [facility] records  . HIV infection ([place])    undetectable viral load and CD4 ct 667 as of 11/2011  . Hyperlipidemia   . Hypertension   . MRSA (methicillin resistant Staphylococcus aureus)   . Multiple sclerosis ([place])    in remission as of 11/2011 (diagnosed late 1980s)  . Pain in limb-Right Leg 10/13/2013  . 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[place])    2002  . Pneumonia   . PVD (peripheral vascular disease) ([place])    Left Stent 07/02/2008  . UTI (urinary tract infection)     Review of Systems:  Please see pertinent ROS reviewed in HPI and problem based charting.   Physical Exam:  Vitals:   10/26/16 1504  BP: 140/70  Pulse: 76  Temp: 97.4 F (36.3 C)  TempSrc: Oral  SpO2: 96%  Weight: 184 lb (83.5 kg)   Physical Exam  Constitutional: He is oriented to person, place, and time. He appears well-developed and well-nourished. No distress.  HENT:  Head: Normocephalic and atraumatic.  Cardiovascular: Normal rate and regular rhythm.   Pulmonary/Chest: Effort normal and breath sounds normal.  Neurological: He is alert and oriented to person, place, and time.  Skin: Skin is warm and dry.     Assessment & Plan:   See Encounters Tab for problem based charting.  Patient discussed  with Dr. Daryll Drown .  Essential (primary) hypertension BP Readings from Last 3 Encounters:  10/26/16 140/70  07/17/16 135/75  06/15/16 135/66   Assessment: BP today is slightly above goal of less than 130.  He is only on monotherapy with lisinopril 40mg  daily.  No headaches, vision change, CP.  Plan: - continue current management - RTC 3 months.  Consider addition of 2nd agent if still above goal.  DM type 2 with diabetic peripheral neuropathy ([place]) Lab Results  Component Value Date   HGBA1C 5.6 10/26/2016    Assessment: His A1c has continued to decrease from 10.2 >> 7.6 >> 5.6 today on current regimen.  He has not experienced any symptoms of hypoglycemia.  He has on occasion held his dose of Humalog if his CBG is less than 100.  Plan: - continue Lantus 80 units QHS - continue liraglutide 1.8mg  QDay - continue pioglitazone 30mg  Qday - in an attempt to even out his basal - bolus insulin dosing, will decrease his Humalog to 29units TID - RTC 3 months for repeat A1c.  Consider further titration of bolus insulin at that time.

## 2016-10-27 ENCOUNTER — Encounter: Payer: Self-pay | Admitting: Dietician

## 2016-10-29 NOTE — Progress Notes (Signed)
Internal Medicine Clinic Attending  Case discussed with Dr. Wallace at the time of the visit.  We reviewed the resident's history and exam and pertinent patient test results.  I agree with the assessment, diagnosis, and plan of care documented in the resident's note.  

## 2016-10-30 ENCOUNTER — Encounter: Payer: Self-pay | Admitting: *Deleted

## 2016-11-07 ENCOUNTER — Emergency Department (HOSPITAL_COMMUNITY): Payer: Medicare Other

## 2016-11-07 ENCOUNTER — Encounter (HOSPITAL_COMMUNITY): Payer: Self-pay | Admitting: Emergency Medicine

## 2016-11-07 ENCOUNTER — Emergency Department (HOSPITAL_COMMUNITY)
Admission: EM | Admit: 2016-11-07 | Discharge: 2016-11-07 | Disposition: A | Discharge status: Home / Self Care | Payer: Medicare Other | Attending: Emergency Medicine | Admitting: Emergency Medicine

## 2016-11-07 DIAGNOSIS — S52134A Nondisplaced fracture of neck of right radius, initial encounter for closed fracture: Secondary | ICD-10-CM | POA: Insufficient documentation

## 2016-11-07 DIAGNOSIS — Z79899 Other long term (current) drug therapy: Secondary | ICD-10-CM | POA: Insufficient documentation

## 2016-11-07 DIAGNOSIS — J45909 Unspecified asthma, uncomplicated: Secondary | ICD-10-CM | POA: Insufficient documentation

## 2016-11-07 DIAGNOSIS — E785 Hyperlipidemia, unspecified: Secondary | ICD-10-CM | POA: Insufficient documentation

## 2016-11-07 DIAGNOSIS — E1140 Type 2 diabetes mellitus with diabetic neuropathy, unspecified: Secondary | ICD-10-CM | POA: Insufficient documentation

## 2016-11-07 DIAGNOSIS — Y92480 Sidewalk as the place of occurrence of the external cause: Secondary | ICD-10-CM | POA: Insufficient documentation

## 2016-11-07 DIAGNOSIS — W01198A Fall on same level from slipping, tripping and stumbling with subsequent striking against other object, initial encounter: Secondary | ICD-10-CM | POA: Insufficient documentation

## 2016-11-07 DIAGNOSIS — Y998 Other external cause status: Secondary | ICD-10-CM | POA: Diagnosis not present

## 2016-11-07 DIAGNOSIS — I10 Essential (primary) hypertension: Secondary | ICD-10-CM | POA: Insufficient documentation

## 2016-11-07 DIAGNOSIS — G35 Multiple sclerosis: Secondary | ICD-10-CM | POA: Diagnosis not present

## 2016-11-07 DIAGNOSIS — Z87891 Personal history of nicotine dependence: Secondary | ICD-10-CM | POA: Diagnosis not present

## 2016-11-07 DIAGNOSIS — Z794 Long term (current) use of insulin: Secondary | ICD-10-CM | POA: Insufficient documentation

## 2016-11-07 DIAGNOSIS — S59911A Unspecified injury of right forearm, initial encounter: Secondary | ICD-10-CM | POA: Diagnosis present

## 2016-11-07 DIAGNOSIS — B20 Human immunodeficiency virus [HIV] disease: Secondary | ICD-10-CM | POA: Diagnosis not present

## 2016-11-07 DIAGNOSIS — Y939 Activity, unspecified: Secondary | ICD-10-CM | POA: Diagnosis not present

## 2016-11-07 MED ORDER — IBUPROFEN 800 MG PO TABS
800.0000 mg | ORAL_TABLET | Freq: Once | ORAL | Status: AC
  Administered 2016-11-07: 800 mg via ORAL
  Filled 2016-11-07: qty 1

## 2016-11-07 NOTE — ED Provider Notes (Signed)
[place] DEPT Provider Note   CSN: [id number] Arrival date & time: 11/07/16  5277  By signing my name below, I, Mayer Masker, attest that this documentation has been prepared under the direction and in the presence of Mia McDonald, PA-C. Electronically Signed: Mayer Masker, Scribe. 11/07/16. 10:35 AM.  History   Chief Complaint Chief Complaint  Patient presents with  . Fall   The history is provided by the patient. No language interpreter was used.    HPI Comments: Nathan [name] is a 66 y.o. male with PMHx of HIV, DM, MS, who presents to the Emergency Department complaining of constant, gradually worsening right-sided elbow and right hand pain s/p a fall that happened yesterday. He has associated swelling to the area. Pt has a prosthetic leg on the right side and states he lost his balance when his leg "got tangled up", and fell on his right elbow onto a cement walkway. He denies dizziness or light-headedness as a cause for the fall. Pt has difficulty gripping things due to pain. He has not tried anything for the pain. He denies numbness, head injury, LOC, hip pain, abdominal pain, CP, vomiting, and nausea. Pt reports no problems with this elbow before.  Past Medical History:  Diagnosis Date  . Asthma    per 2003 [facility] pulm records pfts   . Blood dyscrasia    HIV  . Clotting disorder ([place])   . Colon polyps    noted previous colonoscopy [facility]  . DDD (degenerative disc disease)    cervical spine  . Depression   . Diabetes mellitus    dx 2010  . Fever    unknwon origin  . GERD (gastroesophageal reflux disease)   . Hep C w/o coma, chronic ([place])   . History of syphilis    noted [facility] records  . HIV infection ([place])    undetectable viral load and CD4 ct 667 as of 11/2011  . Hyperlipidemia   . Hypertension   . MRSA (methicillin resistant Staphylococcus aureus)   . Multiple sclerosis ([place])    in remission as of 11/2011 (diagnosed late 1980s)  . Pain in limb-Right Leg  10/13/2013  . 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[place])    2002  . Pneumonia   . PVD (peripheral vascular disease) ([place])    Left Stent 07/02/2008  . UTI (urinary tract infection)     Patient Active Problem List   Diagnosis Date Noted  . Lactic acidosis 03/10/2015  . Opioid dependence ([place]) 10/13/2013  . Syphilis 10/09/2013  . Abnormality of gait 08/14/2012  . Atherosclerosis of native artery of extremity with intermittent claudication ([place]) 04/08/2012  . Occlusion and stenosis of carotid artery without mention of cerebral infarction 12/11/2011  . Phantom limb syndrome (right lower limb) 11/23/2011  . Health care maintenance 11/23/2011  . MRSA colonization 09/21/2010  . Status post above knee amputation ([place]) 08/02/2009  . DM type 2 with diabetic peripheral neuropathy ([place]) 04/05/2009  . Essential (primary) hypertension 02/13/2008  . Hyperlipidemia associated with type 2 diabetes mellitus ([place]) 07/29/2007  . Human immunodeficiency virus (HIV) disease ([place]) 02/12/2006  . COLONIC POLYPS, ADENOMATOUS 02/12/2006  . Multiple sclerosis ([place]) 02/12/2006  . PERIPHERAL VASCULAR DISEASE 02/12/2006  . GERD 02/12/2006  . HEPATITIS B, HX OF (Pt is unaware of dx but states it remains in his chart no prev or current tx as of 11/2011) 02/12/2006    Past Surgical History:  Procedure Laterality Date  . ABOVE KNEE LEG AMPUTATION  Right Leg  . LOWER EXTREMITY ANGIOGRAM Left 12/26/2011   Procedure: LOWER EXTREMITY ANGIOGRAM;  Surgeon: Serafina Mitchell, MD;  [facility] CATH LAB;  Service: Cardiovascular;  Laterality: Left;  . OTHER SURGICAL HISTORY     right lower ext AKA with prothesis   . OTHER SURGICAL HISTORY     left left with stents (Dr. Seward Speck)  . OTHER SURGICAL HISTORY     2003 colonoscopy 5 mm polyp transverse colon; (2)78m  polyps in rectum-hyperplastic       Home Medications    Prior to Admission medications   Medication Sig Start Date End Date Taking? Authorizing  Provider  atorvastatin (LIPITOR) 40 MG tablet Take 1 tablet (40 mg total) by mouth daily. 01/06/16   WJule Ser DO  BAYER CONTOUR NEXT TEST test strip USE TO TEST BLOOD SUGAR FOUR TIMES DAILY BEFORE MEALS AND AT BEDTIME 08/31/16   BBartholomew Crews MD  Blood Glucose Monitoring Suppl (BAYER CONTOUR MONITOR) W/DEVICE KIT Use to check blood sugar as instructed up to 4 times a day 07/11/11   BBartholomew Crews MD  clopidogrel (PLAVIX) 75 MG tablet Take 1 tablet (75 mg total) by mouth daily. 01/06/16   WJule Ser DO  emtricitabine-tenofovir AF (DESCOVY) 200-25 MG tablet Take 1 tablet by mouth daily. 10/23/16   VTruman Hayward MD  ibuprofen (ADVIL,MOTRIN) 200 MG tablet Take 400-600 mg by mouth every 6 (six) hours as needed.    [provider]  Insulin Glargine (LANTUS SOLOSTAR) 100 UNIT/ML Solostar Pen Inject 80 Units into the skin daily at 10 pm. 10/07/15   WJule Ser DO  insulin lispro (HUMALOG KWIKPEN) 100 UNIT/ML KiwkPen Inject 0.29 mLs (29 Units total) into the skin 3 (three) times daily with meals. 10/26/16   WJule Ser DO  Insulin Pen Needle (BD PEN NEEDLE NANO U/F) 32G X 4 MM MISC USE AS DIRECTED FOUR TIMES DAILY. 01/06/16   WJule Ser DO  Lancet Devices (BAYER MICROLET 2 LANCING [facility] Center MISC Use to check blood sugar up to 3 ties a day 04/08/15   WJule Ser DO  Lancets MISC Use to Test Blood Sugar Three Times Daily. Dx Code: 250.02 03/01/12 03/19/14  BMadilyn Fireman MD  lisinopril (PRINIVIL,ZESTRIL) 40 MG tablet TAKE 1 TABLET(40 MG) BY MOUTH DAILY 11/26/15   WJule Ser DO  omeprazole (PRILOSEC) 40 MG capsule TAKE ONE CAPSULE BY MOUTH EVERY DAY 08/16/16   WJule Ser DO  pioglitazone (ACTOS) 30 MG tablet TAKE 1 TABLET BY MOUTH EVERY DAY 06/26/16   WJule Ser DO  raltegravir (ISENTRESS) 400 MG tablet TAKE 1 TABLET(400 MG) BY MOUTH TWICE DAILY 10/23/16   VTommy Medal CLavell Islam MD  VICTOZA 18 MG/3ML SOPN INJECT 1.8MG UNDER THE SKIN EVERY  MORNING 10/17/16   WJule Ser DO  zidovudine (RETROVIR) 300 MG tablet Take 1 tablet (300 mg total) by mouth 2 (two) times daily. 10/03/16   VTruman Hayward MD  zidovudine (RETROVIR) 300 MG tablet TAKE 1 TABLET(300 MG) BY MOUTH TWICE DAILY 10/23/16   VTommy Medal CLavell Islam MD    Family History Family History  Problem Relation Age of Onset  . Diabetes Mother   . Coronary artery disease Mother   . Cancer Brother        colon caner stage 4 as of 11/2011 (unknown age of onset)  . Coronary artery disease Father   . Prostate cancer Brother     Social History Social History  Substance Use Topics  . Smoking  status: Former Smoker    Quit date: 05/09/2007  . Smokeless tobacco: Never Used  . Alcohol use No     Allergies   Sulfonamide derivatives   Review of Systems Review of Systems  Cardiovascular: Negative for chest pain.  Gastrointestinal: Negative for nausea and vomiting.  Musculoskeletal: Positive for arthralgias and joint swelling.  Neurological: Negative for dizziness, syncope, light-headedness and numbness.    Physical Exam Updated Vital Signs BP 139/76 (BP Location: Left Arm)   Pulse 75   Temp 97.9 F (36.6 C) (Oral)   Resp 16   SpO2 97%   Physical Exam  Constitutional: He appears well-developed.  HENT:  Head: Normocephalic.  Eyes: Conjunctivae are normal.  Neck: Neck supple.  Cardiovascular: Normal rate and regular rhythm.   No murmur heard. Pulmonary/Chest: Effort normal.  Abdominal: Soft. He exhibits no distension.  Musculoskeletal: He exhibits tenderness.  No TTP over lateral epicondyle no medial epicondyle tenderness No TTP over olecranon TTP over the distal radius.  Full ROM of right elbow Full ROM of right shoulder and right wrist 5/5 grip strength of bilateral upper extremities Radial pulses 2+ Sensations intact  Neurological: He is alert.  Skin: Skin is warm and dry.  Psychiatric: His behavior is normal.  Nursing note and vitals  reviewed.    ED Treatments / Results  DIAGNOSTIC STUDIES: Oxygen Saturation is 97% on RA, normal by my interpretation.    COORDINATION OF CARE: 10:33 AM Discussed treatment plan with pt at bedside and pt agreed to plan.  Labs (all labs ordered are listed, but only abnormal results are displayed) Labs Reviewed - No data to display  EKG  EKG Interpretation None       Radiology Dg Elbow Complete Right  Result Date: 11/07/2016 CLINICAL DATA:  Fall. EXAM: RIGHT ELBOW - COMPLETE 3+ VIEW COMPARISON:  No recent. FINDINGS: Soft tissue swelling. A fracture noted of the of proximal right radial neck. Fracture is nondisplaced. No other focal abnormality identified. IMPRESSION: Nondisplaced fracture of the proximal right radial neck. Electronically Signed   By: Marcello Moores  Register   On: 11/07/2016 10:55    Procedures Procedures (including critical care time)  Medications Ordered in ED Medications  ibuprofen (ADVIL,MOTRIN) tablet 800 mg (800 mg Oral Given 11/07/16 1144)     Initial Impression / Assessment and Plan / ED Course  I have reviewed the triage vital signs and the nursing notes.  Pertinent labs & imaging results that were available during my care of the patient were reviewed by me and considered in my medical decision making (see chart for details).     Patient presenting with right arm pain that began >24 hours ago s/p fall. The patient was seen and evaluated with Dr. Eulis Foster, attending physician. Denies dizziness, lightheadedness, or head injury. X-rays demonstrating nondisplaced fracture of the radial neck. We will place the patient in a sling for immobilization and provider follow-up to orthopedics. Given that the incident occurred greater than 24 hours ago, I will not place the patient and a posterior splint at this time. Encouraged NSAIDs for pain control and early range of motion exercises as the pain allows. Vital signs stable. No acute distress. Discussed the plan with the  patient who is agreeable at this time. The patient is stable for discharge.  Final Clinical Impressions(s) / ED Diagnoses   Final diagnoses:  Closed nondisplaced fracture of neck of right radius, initial encounter    New Prescriptions Discharge Medication List as of 11/07/2016 11:27 AM  I personally performed the services described in this documentation, which was scribed in my presence. The recorded information has been reviewed and is accurate.     Joline Maxcy A, PA-C 11/07/16 Kathaleen Maser    Daleen Bo, MD 11/08/16 [phone]

## 2016-11-07 NOTE — Discharge Instructions (Signed)
Please call Dr. Luanna Cole office today to schedule a follow up appointment for 3-5 days from now. You can take Tylenol or ibuprofen as needed for pain control. You can apply ice for 15-20 minutes up to 3-4 times per day to help with inflammation. Please wear the sling for comfort and begin included range of motion exercises for the elbow as early as possible when the pain allows. If you develop new or worsening symptoms, including new falls or injuries, please return to the Emergency Department for re-evaluation.

## 2016-11-07 NOTE — ED Provider Notes (Signed)
  Face-to-face evaluation   History: Presents for evaluation of right arm pain, mostly right elbow.  Sustained fall several days ago.  Physical exam: Alert elderly man.  He is somewhat uncomfortable.  Vertebral tender laterally.  Diminished flexion secondary to pain, preserved pronation and supination.  Neurovascular intact distally.   Medical screening examination/treatment/procedure(s) were conducted as a shared visit with non-physician practitioner(s) and myself.  I personally evaluated the patient during the encounter    Daleen Bo, MD 11/08/16 [phone]

## 2016-11-07 NOTE — ED Triage Notes (Signed)
Pt has prosthetic leg and yesterday lost his footing and fell hitting his right elbow. Pt denies any other injury. Elbow appears swollen. Radial pulse intact.

## 2016-11-07 NOTE — ED Notes (Signed)
Patient transported to X-ray 

## 2016-11-13 DIAGNOSIS — S52134A Nondisplaced fracture of neck of right radius, initial encounter for closed fracture: Secondary | ICD-10-CM | POA: Diagnosis not present

## 2016-11-20 ENCOUNTER — Other Ambulatory Visit: Payer: Self-pay | Admitting: Internal Medicine

## 2016-11-27 DIAGNOSIS — S52134D Nondisplaced fracture of neck of right radius, subsequent encounter for closed fracture with routine healing: Secondary | ICD-10-CM | POA: Diagnosis not present

## 2016-12-07 ENCOUNTER — Encounter: Payer: Self-pay | Admitting: Infectious Disease

## 2016-12-09 ENCOUNTER — Other Ambulatory Visit: Payer: Self-pay | Admitting: Internal Medicine

## 2016-12-09 DIAGNOSIS — E1142 Type 2 diabetes mellitus with diabetic polyneuropathy: Secondary | ICD-10-CM

## 2016-12-21 ENCOUNTER — Other Ambulatory Visit: Payer: Self-pay | Admitting: Internal Medicine

## 2016-12-21 DIAGNOSIS — E1142 Type 2 diabetes mellitus with diabetic polyneuropathy: Secondary | ICD-10-CM

## 2016-12-29 ENCOUNTER — Other Ambulatory Visit: Payer: Self-pay | Admitting: Internal Medicine

## 2016-12-29 DIAGNOSIS — I739 Peripheral vascular disease, unspecified: Secondary | ICD-10-CM

## 2017-01-01 ENCOUNTER — Telehealth: Payer: Self-pay | Admitting: *Deleted

## 2017-01-01 NOTE — Telephone Encounter (Signed)
Patient called stating he may have contracted gonorrhea from oral sex. Advised patient to go to the health dept for testing. He asked if [facility] does testing and I said yes. He is going to call there to see when he can be tested. Myrtis Hopping

## 2017-01-22 ENCOUNTER — Ambulatory Visit (INDEPENDENT_AMBULATORY_CARE_PROVIDER_SITE_OTHER): Payer: Medicare Other | Admitting: Family

## 2017-01-22 ENCOUNTER — Encounter: Payer: Self-pay | Admitting: Family

## 2017-01-22 ENCOUNTER — Ambulatory Visit (HOSPITAL_COMMUNITY)
Admission: RE | Admit: 2017-01-22 | Discharge: 2017-01-22 | Disposition: A | Discharge status: Home / Self Care | Payer: Medicare Other | Source: Ambulatory Visit | Attending: Surgery | Admitting: Surgery

## 2017-01-22 ENCOUNTER — Ambulatory Visit (INDEPENDENT_AMBULATORY_CARE_PROVIDER_SITE_OTHER)
Admission: RE | Admit: 2017-01-22 | Discharge: 2017-01-22 | Disposition: A | Discharge status: Home / Self Care | Payer: Medicare Other | Source: Ambulatory Visit | Attending: Surgery | Admitting: Surgery

## 2017-01-22 ENCOUNTER — Ambulatory Visit: Payer: Self-pay

## 2017-01-22 VITALS — BP 143/83 | HR 68 | Temp 97.0°F | Resp 20 | Ht 73.0 in | Wt 178.4 lb

## 2017-01-22 DIAGNOSIS — I739 Peripheral vascular disease, unspecified: Secondary | ICD-10-CM | POA: Insufficient documentation

## 2017-01-22 DIAGNOSIS — Z89611 Acquired absence of right leg above knee: Secondary | ICD-10-CM

## 2017-01-22 DIAGNOSIS — I6523 Occlusion and stenosis of bilateral carotid arteries: Secondary | ICD-10-CM | POA: Insufficient documentation

## 2017-01-22 DIAGNOSIS — Z87891 Personal history of nicotine dependence: Secondary | ICD-10-CM

## 2017-01-22 DIAGNOSIS — I779 Disorder of arteries and arterioles, unspecified: Secondary | ICD-10-CM

## 2017-01-22 LAB — VAS US CAROTID
LCCADDIAS: -29 cm/s
LCCAPSYS: 101 cm/s
LEFT ECA DIAS: -16 cm/s
LEFT VERTEBRAL DIAS: -17 cm/s
Left CCA dist sys: -149 cm/s
Left CCA prox dias: 17 cm/s
RCCAPSYS: 116 cm/s
RIGHT CCA MID DIAS: 18 cm/s
RIGHT VERTEBRAL DIAS: -9 cm/s
Right CCA prox dias: 17 cm/s
Right cca dist sys: -133 cm/s

## 2017-01-22 NOTE — Progress Notes (Signed)
[facility] [place] HISTORY AND PHYSICAL   MRN : [id number]  History of Present Illness:   Nathan [name] is a 66 y.o. male who is s/p left SFA stenting in 2010. He is s/p right AKA in 2009.  Dr. Trula Slade performed an aortogram in 2013 to evaluate LE perfusion.  Pt is ambulating with a right above-knee prosthesis. He worked with [employer] to address the moderate pain he was having at the bony prominence of the right AKA stump with walking, and had resolution of this pain.  He has stinging and burning in foot up to left knee, attributes to neuropathy. Has occassional left calf pain with walking various distances, denies non healing wounds.  Pt denies any history of stroke or TIA.  He was last evaluated by K. Trinh, PA-C, on 07-17-16. At that time left SFA stent was patent and ABIs stable. He continued to have some neuropathic type symptoms in the left leg and foot. Ambulating well with right leg prosthesis. Pt advised to continue maximal medical management with diabetes control, plavix and statin. Follow up in one year with repeat ABIs and left leg arterial duplex.  Carotid duplex demonstrated 60-79% bilateral carotid stenosis, he had been asymptomatic. His intermittent bilateral eye blurriness resolved with blinking and had been unchanged for the prior several years. Did not appear to be amaurosis fugax. Did discuss with Dr. Trula Slade that he is below the 80% threshold for surgical intervention. Pt was to follow up in 6 months with repeat carotid duplex. The patient knows to seek emergency assistance if he develops any TIA or stroke symptoms.   Pt Diabetic: Yes, 5.6 A1C on 10-26-16,  improved from over 8 per pt. Pt smoker: former smoker, quit in 2009  Pt meds include: Statin :Yes ASA: No Other anticoagulants/antiplatelets: Plavix   Current Outpatient Prescriptions  Medication Sig Dispense Refill  . atorvastatin (LIPITOR) 40 MG tablet Take 1 tablet (40 mg total) by  mouth daily. 90 tablet 3  . BAYER CONTOUR NEXT TEST test strip USE TO TEST BLOOD SUGAR FOUR TIMES DAILY BEFORE MEALS AND AT BEDTIME 100 each 11  . BD PEN NEEDLE NANO U/F 32G X 4 MM MISC USE AS DIRECTED FOUR TIMES DAILY 100 each 5  . Blood Glucose Monitoring Suppl (BAYER CONTOUR MONITOR) W/DEVICE KIT Use to check blood sugar as instructed up to 4 times a day 1 kit 0  . clopidogrel (PLAVIX) 75 MG tablet TAKE 1 TABLET BY MOUTH DAILY 90 tablet 5  . emtricitabine-tenofovir AF (DESCOVY) 200-25 MG tablet Take 1 tablet by mouth daily. 30 tablet 5  . ibuprofen (ADVIL,MOTRIN) 200 MG tablet Take 400-600 mg by mouth every 6 (six) hours as needed.    . Insulin Glargine (LANTUS SOLOSTAR) 100 UNIT/ML Solostar Pen Inject 80 Units into the skin daily at 10 pm. 15 mL 11  . insulin lispro (HUMALOG KWIKPEN) 100 UNIT/ML KiwkPen Inject 0.29 mLs (29 Units total) into the skin 3 (three) times daily with meals. (Patient taking differently: Inject 29 Units into the skin 3 (three) times daily with meals. ) 30 mL 11  . Lancet Devices (BAYER MICROLET 2 LANCING DEVIC) MISC Use to check blood sugar up to 3 ties a day 1 each 2  . lisinopril (PRINIVIL,ZESTRIL) 40 MG tablet TAKE 1 TABLET BY MOUTH DAILY 90 tablet 1  . omeprazole (PRILOSEC) 40 MG capsule TAKE ONE CAPSULE BY MOUTH EVERY DAY 90 capsule 1  . pioglitazone (ACTOS) 30 MG tablet TAKE 1 TABLET BY  MOUTH EVERY DAY 90 tablet 1  . raltegravir (ISENTRESS) 400 MG tablet TAKE 1 TABLET(400 MG) BY MOUTH TWICE DAILY 60 tablet 5  . VICTOZA 18 MG/3ML SOPN INJECT 1.8MG UNDER THE SKIN EVERY MORNING 9 mL 3  . zidovudine (RETROVIR) 300 MG tablet Take 1 tablet (300 mg total) by mouth 2 (two) times daily. 60 tablet 5  . Lancets MISC Use to Test Blood Sugar Three Times Daily. Dx Code: 250.02 100 each 3   No current facility-administered medications for this visit.     Past Medical History:  Diagnosis Date  . Asthma    per 2003 [facility] pulm records pfts   . Blood dyscrasia    HIV  .  Clotting disorder ([place])   . Colon polyps    noted previous colonoscopy [facility]  . DDD (degenerative disc disease)    cervical spine  . Depression   . Diabetes mellitus    dx 2010  . Fever    unknwon origin  . GERD (gastroesophageal reflux disease)   . Hep C w/o coma, chronic ([place])   . History of syphilis    noted [facility] records  . HIV infection ([place])    undetectable viral load and CD4 ct 667 as of 11/2011  . Hyperlipidemia   . Hypertension   . MRSA (methicillin resistant Staphylococcus aureus)   . Multiple sclerosis ([place])    in remission as of 11/2011 (diagnosed late 1980s)  . Pain in limb-Right Leg 10/13/2013  . 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[place])    2002  . Pneumonia   . PVD (peripheral vascular disease) ([place])    Left Stent 07/02/2008  . UTI (urinary tract infection)     Social History Social History  Substance Use Topics  . Smoking status: Former Smoker    Quit date: 05/09/2007  . Smokeless tobacco: Never Used  . Alcohol use No    Family History Family History  Problem Relation Age of Onset  . Diabetes Mother   . Coronary artery disease Mother   . Cancer Brother        colon caner stage 4 as of 11/2011 (unknown age of onset)  . Coronary artery disease Father   . Prostate cancer Brother     Surgical History Past Surgical History:  Procedure Laterality Date  . ABOVE KNEE LEG AMPUTATION     Right Leg  . LOWER EXTREMITY ANGIOGRAM Left 12/26/2011   Procedure: LOWER EXTREMITY ANGIOGRAM;  Surgeon: Serafina Mitchell, MD;  Location: [facility] CATH LAB;  Service: Cardiovascular;  Laterality: Left;  . OTHER SURGICAL HISTORY     right lower ext AKA with prothesis   . OTHER SURGICAL HISTORY     left left with stents (Dr. Seward Speck)  . OTHER SURGICAL HISTORY     2003 colonoscopy 5 mm polyp transverse colon; (2)55m  polyps in rectum-hyperplastic    Allergies  Allergen Reactions  . Sulfonamide Derivatives Hives    Current Outpatient Prescriptions  Medication Sig  Dispense Refill  . atorvastatin (LIPITOR) 40 MG tablet Take 1 tablet (40 mg total) by mouth daily. 90 tablet 3  . BAYER CONTOUR NEXT TEST test strip USE TO TEST BLOOD SUGAR FOUR TIMES DAILY BEFORE MEALS AND AT BEDTIME 100 each 11  . BD PEN NEEDLE NANO U/F 32G X 4 MM MISC USE AS DIRECTED FOUR TIMES DAILY 100 each 5  . Blood Glucose Monitoring Suppl (BAYER CONTOUR MONITOR) W/DEVICE KIT Use to check blood sugar as instructed up to 4 times a  day 1 kit 0  . clopidogrel (PLAVIX) 75 MG tablet TAKE 1 TABLET BY MOUTH DAILY 90 tablet 5  . emtricitabine-tenofovir AF (DESCOVY) 200-25 MG tablet Take 1 tablet by mouth daily. 30 tablet 5  . ibuprofen (ADVIL,MOTRIN) 200 MG tablet Take 400-600 mg by mouth every 6 (six) hours as needed.    . Insulin Glargine (LANTUS SOLOSTAR) 100 UNIT/ML Solostar Pen Inject 80 Units into the skin daily at 10 pm. 15 mL 11  . insulin lispro (HUMALOG KWIKPEN) 100 UNIT/ML KiwkPen Inject 0.29 mLs (29 Units total) into the skin 3 (three) times daily with meals. (Patient taking differently: Inject 29 Units into the skin 3 (three) times daily with meals. ) 30 mL 11  . Lancet Devices (BAYER MICROLET 2 LANCING DEVIC) MISC Use to check blood sugar up to 3 ties a day 1 each 2  . lisinopril (PRINIVIL,ZESTRIL) 40 MG tablet TAKE 1 TABLET BY MOUTH DAILY 90 tablet 1  . omeprazole (PRILOSEC) 40 MG capsule TAKE ONE CAPSULE BY MOUTH EVERY DAY 90 capsule 1  . pioglitazone (ACTOS) 30 MG tablet TAKE 1 TABLET BY MOUTH EVERY DAY 90 tablet 1  . raltegravir (ISENTRESS) 400 MG tablet TAKE 1 TABLET(400 MG) BY MOUTH TWICE DAILY 60 tablet 5  . VICTOZA 18 MG/3ML SOPN INJECT 1.8MG UNDER THE SKIN EVERY MORNING 9 mL 3  . zidovudine (RETROVIR) 300 MG tablet Take 1 tablet (300 mg total) by mouth 2 (two) times daily. 60 tablet 5  . Lancets MISC Use to Test Blood Sugar Three Times Daily. Dx Code: 250.02 100 each 3   No current facility-administered medications for this visit.      REVIEW OF SYSTEMS: See HPI for  pertinent positives and negatives.  Physical Examination Vitals:   01/22/17 1304 01/22/17 1306  BP: 140/77 (!) 143/83  Pulse: 68   Resp: 20   Temp: (!) 97 F (36.1 C)   TempSrc: Oral   SpO2: 99%   Weight: 178 lb 6.4 oz (80.9 kg)   Height: '6\' 1"'  (1.854 m)    Body mass index is 23.54 kg/m.  General:  A&O x 3, WDWN. Gait: using right AKA prosthesis Eyes: PERRLA. Pulmonary: Respirations are non labored, CTAB, without wheezes, rales, or rhonchi. Cardiac: regular rythm, no detected murmur.        Carotid Bruits Left Right   Negative Positive   Abdominal aortic pulse is not palpable. Radial pulses: are 2+ palpable and =                           VASCULAR EXAM: Extremities without ischemic changes  without Gangrene; without open wounds. All left toes are pink and warm with brisk capillary refill.  Right AKA prosthesis in place.  LE Pulses LEFT RIGHT       FEMORAL  2+ palpable  not palpable        POPLITEAL  not palpable   AKA       POSTERIOR TIBIAL  not palpable   AKA        DORSALIS PEDIS      ANTERIOR TIBIAL faintly palpable  AKA    Abdomen: soft, NT, no palpated masses. Skin: no rashes, no ulcers noted. Musculoskeletal: no muscle wasting or atrophy.         Neurologic: A&O X 3; Appropriate Affect, sensation is normal; MOTOR FUNCTION:  moving all extremities equally, motor strength 5/5 throughout. Speech is fluent/normal. CN 2-12 intact.     ASSESSMENT:  Nathan [name] is a 66 y.o. male who is status post s/p left SFA stenting in 2010. He is s/p right AKA in 2009.  He is ambulating with a right above-knee prosthesis. Has stinging and burning in foot up to left knee, attributes to neuropathy, no change in this. Has occassional left calf pain with walking various distances. There are no signs of ischemia in  his feet or legs.   Pt denies any history of stroke or TIA.  His DM is in excellent control and he quit smoking in 2009.  He takes a statin and Plavix.    DATA  Carotid Duplex (01/22/17): Right ICA: 60-79% stenosis. Left ICA: 40-59% stenosis (EDV of 55 today, was 77 on 07-17-16). Location of bifurcation is mid to upper hyoid notch.  Bilateral vertebral artery flow is antegrade.  Right subclavian artery waveforms are biphasic, left are triphasic.  Right ICA unchanged, left ICA stenosis seems less than on 07-17-16.   ABI (Date: 01/22/2017):  R: AKA    L:   ABI: 0.93 (was 0.85 on 07-17-16),   PT: tri (was bi)  DP: tri (was bi)  TBI: 0.44 (was 0.65)  PLAN:  I discussed in depth with the patient the nature of atherosclerosis, and emphasized the importance of maximal medical management including strict control of blood pressure, blood glucose, and lipid levels, obtaining regular exercise, and continued cessation of smoking.  The patient is aware that without maximal medical management the underlying atherosclerotic disease process will progress, limiting the benefit of any interventions.  Based on the patient's vascular studies and examination, pt will return to clinic in 6 months for left ABI, left LE arterial Duplex, and carotid Duplex.  The patient was given information about stroke prevention and what symptoms should prompt the patient to seek immediate medical care.  The patient was given information about PAD including signs, symptoms, treatment, what symptoms should prompt the patient to seek immediate medical care, and risk reduction measures to take.  Thank you for allowing [place] to participate in this patient's care.  Clemon Chambers, RN, MSN, FNP-C [facility] Office: [phone]  Clinic MD: Trula Slade 01/22/2017 1:17 PM

## 2017-01-22 NOTE — Patient Instructions (Signed)
Stroke Prevention Some medical conditions and behaviors are associated with an increased chance of having a stroke. You may prevent a stroke by making healthy choices and managing medical conditions. How can I reduce my risk of having a stroke?  Stay physically active. Get at least 30 minutes of activity on most or all days.  Do not smoke. It may also be helpful to avoid exposure to secondhand smoke.  Limit alcohol use. Moderate alcohol use is considered to be:  No more than 2 drinks per day for men.  No more than 1 drink per day for nonpregnant women.  Eat healthy foods. This involves:  Eating 5 or more servings of fruits and vegetables a day.  Making dietary changes that address high blood pressure (hypertension), high cholesterol, diabetes, or obesity.  Manage your cholesterol levels.  Making food choices that are high in fiber and low in saturated fat, trans fat, and cholesterol may control cholesterol levels.  Take any prescribed medicines to control cholesterol as directed by your health care provider.  Manage your diabetes.  Controlling your carbohydrate and sugar intake is recommended to manage diabetes.  Take any prescribed medicines to control diabetes as directed by your health care provider.  Control your hypertension.  Making food choices that are low in salt (sodium), saturated fat, trans fat, and cholesterol is recommended to manage hypertension.  Ask your health care provider if you need treatment to lower your blood pressure. Take any prescribed medicines to control hypertension as directed by your health care provider.  If you are 18-39 years of age, have your blood pressure checked every 3-5 years. If you are 40 years of age or older, have your blood pressure checked every year.  Maintain a healthy weight.  Reducing calorie intake and making food choices that are low in sodium, saturated fat, trans fat, and cholesterol are recommended to manage  weight.  Stop drug abuse.  Avoid taking birth control pills.  Talk to your health care provider about the risks of taking birth control pills if you are over 35 years old, smoke, get migraines, or have ever had a blood clot.  Get evaluated for sleep disorders (sleep apnea).  Talk to your health care provider about getting a sleep evaluation if you snore a lot or have excessive sleepiness.  Take medicines only as directed by your health care provider.  For some people, aspirin or blood thinners (anticoagulants) are helpful in reducing the risk of forming abnormal blood clots that can lead to stroke. If you have the irregular heart rhythm of atrial fibrillation, you should be on a blood thinner unless there is a good reason you cannot take them.  Understand all your medicine instructions.  Make sure that other conditions (such as anemia or atherosclerosis) are addressed. Get help right away if:  You have sudden weakness or numbness of the face, arm, or leg, especially on one side of the body.  Your face or eyelid droops to one side.  You have sudden confusion.  You have trouble speaking (aphasia) or understanding.  You have sudden trouble seeing in one or both eyes.  You have sudden trouble walking.  You have dizziness.  You have a loss of balance or coordination.  You have a sudden, severe headache with no known cause.  You have new chest pain or an irregular heartbeat. Any of these symptoms may represent a serious problem that is an emergency. Do not wait to see if the symptoms will go away.   Get medical help at once. Call your local emergency services (911 in [place] Do not drive yourself to the hospital.  This information is not intended to replace advice given to you by your health care provider. Make sure you discuss any questions you have with your health care provider. Document Released: 06/01/2004 Document Revised: 09/30/2015 Document Reviewed: 10/25/2012 Elsevier  Interactive Patient Education  2017 Elsevier Inc.      Peripheral Vascular Disease Peripheral vascular disease (PVD) is a disease of the blood vessels that are not part of your heart and brain. A simple term for PVD is poor circulation. In most cases, PVD narrows the blood vessels that carry blood from your heart to the rest of your body. This can result in a decreased supply of blood to your arms, legs, and internal organs, like your stomach or kidneys. However, it most often affects a person's lower legs and feet. There are two types of PVD.  Organic PVD. This is the more common type. It is caused by damage to the structure of blood vessels.  Functional PVD. This is caused by conditions that make blood vessels contract and tighten (spasm). Without treatment, PVD tends to get worse over time. PVD can also lead to acute ischemic limb. This is when an arm or limb suddenly has trouble getting enough blood. This is a medical emergency. Follow these instructions at home:  Take medicines only as told by your doctor.  Do not use any tobacco products, including cigarettes, chewing tobacco, or electronic cigarettes. If you need help quitting, ask your doctor.  Lose weight if you are overweight, and maintain a healthy weight as told by your doctor.  Eat a diet that is low in fat and cholesterol. If you need help, ask your doctor.  Exercise regularly. Ask your doctor for some good activities for you.  Take good care of your feet.  Wear comfortable shoes that fit well.  Check your feet often for any cuts or sores. Contact a doctor if:  You have cramps in your legs while walking.  You have leg pain when you are at rest.  You have coldness in a leg or foot.  Your skin changes.  You are unable to get or have an erection (erectile dysfunction).  You have cuts or sores on your feet that are not healing. Get help right away if:  Your arm or leg turns cold and blue.  Your arms or legs  become red, warm, swollen, painful, or numb.  You have chest pain or trouble breathing.  You suddenly have weakness in your face, arm, or leg.  You become very confused or you cannot speak.  You suddenly have a very bad headache.  You suddenly cannot see. This information is not intended to replace advice given to you by your health care provider. Make sure you discuss any questions you have with your health care provider. Document Released: 07/19/2009 Document Revised: 09/30/2015 Document Reviewed: 10/02/2013 Elsevier Interactive Patient Education  2017 Elsevier Inc.  

## 2017-01-24 ENCOUNTER — Other Ambulatory Visit: Payer: Self-pay | Admitting: *Deleted

## 2017-01-24 DIAGNOSIS — E1142 Type 2 diabetes mellitus with diabetic polyneuropathy: Secondary | ICD-10-CM

## 2017-01-25 ENCOUNTER — Encounter: Payer: Self-pay | Admitting: Internal Medicine

## 2017-01-25 ENCOUNTER — Ambulatory Visit (INDEPENDENT_AMBULATORY_CARE_PROVIDER_SITE_OTHER): Payer: Medicare Other | Admitting: Internal Medicine

## 2017-01-25 VITALS — BP 133/52 | HR 75 | Temp 97.8°F | Ht 73.0 in | Wt 179.8 lb

## 2017-01-25 DIAGNOSIS — I10 Essential (primary) hypertension: Secondary | ICD-10-CM | POA: Diagnosis not present

## 2017-01-25 DIAGNOSIS — Z794 Long term (current) use of insulin: Secondary | ICD-10-CM

## 2017-01-25 DIAGNOSIS — E1142 Type 2 diabetes mellitus with diabetic polyneuropathy: Secondary | ICD-10-CM | POA: Diagnosis not present

## 2017-01-25 DIAGNOSIS — Z23 Encounter for immunization: Secondary | ICD-10-CM

## 2017-01-25 DIAGNOSIS — Z89611 Acquired absence of right leg above knee: Secondary | ICD-10-CM | POA: Diagnosis not present

## 2017-01-25 DIAGNOSIS — Z87891 Personal history of nicotine dependence: Secondary | ICD-10-CM | POA: Diagnosis not present

## 2017-01-25 DIAGNOSIS — Z7902 Long term (current) use of antithrombotics/antiplatelets: Secondary | ICD-10-CM | POA: Diagnosis not present

## 2017-01-25 DIAGNOSIS — Z79899 Other long term (current) drug therapy: Secondary | ICD-10-CM

## 2017-01-25 DIAGNOSIS — Z0000 Encounter for general adult medical examination without abnormal findings: Secondary | ICD-10-CM

## 2017-01-25 LAB — GLUCOSE, CAPILLARY: GLUCOSE-CAPILLARY: 201 mg/dL — AB (ref 65–99)

## 2017-01-25 LAB — POCT GLYCOSYLATED HEMOGLOBIN (HGB A1C): Hemoglobin A1C: 5.6

## 2017-01-25 MED ORDER — INSULIN LISPRO 100 UNIT/ML (KWIKPEN): 26.0000 [IU] | PEN_INJECTOR | Freq: Three times a day (TID) | SUBCUTANEOUS | 11 refills | Status: DC

## 2017-01-25 MED ORDER — INSULIN GLARGINE 100 UNIT/ML SOLOSTAR PEN: 80.0000 [IU] | PEN_INJECTOR | Freq: Every day | SUBCUTANEOUS | 11 refills | Status: DC

## 2017-01-25 NOTE — Patient Instructions (Signed)
Please schedule an appointment with Dr. Tommy Medal  Your Diabetes is under much better control.  We are decreasing your Humalog to 26 units three times a day.  All other medications is the same.  Please see me in about 6 months or sooner if needed.

## 2017-01-25 NOTE — Progress Notes (Signed)
   CC: here for DM follow up  HPI:  [name] is a 66 y.o. man with a past medical history listed below here today for follow up of his DM and HTN.   For details of today's visit and the status of his chronic medical issues please refer to the assessment and plan.   Past Medical History:  Diagnosis Date  . Asthma    per 2003 [facility] pulm records pfts   . Blood dyscrasia    HIV  . Clotting disorder ([place])   . Colon polyps    noted previous colonoscopy [facility]  . DDD (degenerative disc disease)    cervical spine  . Depression   . Diabetes mellitus    dx 2010  . Fever    unknwon origin  . GERD (gastroesophageal reflux disease)   . Hep C w/o coma, chronic ([place])   . History of syphilis    noted [facility] records  . HIV infection ([place])    undetectable viral load and CD4 ct 667 as of 11/2011  . Hyperlipidemia   . Hypertension   . MRSA (methicillin resistant Staphylococcus aureus)   . Multiple sclerosis ([place])    in remission as of 11/2011 (diagnosed late 1980s)  . Pain in limb-Right Leg 10/13/2013  . 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[place])    2002  . Pneumonia   . PVD (peripheral vascular disease) ([place])    Left Stent 07/02/2008  . UTI (urinary tract infection)    Review of Systems:  Please see pertinent ROS reviewed in HPI and problem based charting.   Physical Exam:  Vitals:   01/25/17 1503  BP: (!) 133/52  Pulse: 75  Temp: 97.8 F (36.6 C)  TempSrc: Oral  SpO2: 97%  Weight: 179 lb 12.8 oz (81.6 kg)  Height: 6\' 1"  (1.854 m)   General: NAD HEENT: NCAT, EOMI, no scleral icterus Pulm: normal effort Ext: s/p right AKA.  Left foot with diminished PT and DP pulses.  No ulcer or open wounds. Neuro: alert and oriented X3, cranial nerves II-XII grossly intact   Assessment & Plan:   See Encounters Tab for problem based charting.  Patient discussed with Dr. Angelia Mould .  Essential (primary) hypertension BP Readings from Last 3 Encounters:  01/25/17 (!) 133/52    01/22/17 (!) 143/83  11/07/16 139/76   A: BP today is essentially at goal of SBP 130 on monotherapy with lisinopril 40mg  daily.  No complaints of HA, vision change, CP.  P: - continue lisinopril 40mg  daily - RTC 6 months - BMET today with stable renal function.  DM type 2 with diabetic peripheral neuropathy (HCC) Lab Results  Component Value Date   HGBA1C 5.6 01/25/2017    A: Hgb A1c is stable at 5.6 from 3 months ago after decreasing his meal time Humalog.  He has not experienced any symptoms of hypoglycemia or hyperglycemia.  P: - Continue Lantus 80mg  QHS - Decrease Humalog to 26 units TID to further even out his basal to bolus dosing - Continue liraglutide 1.8mg  QDay - Continue pioglitazone 30mg  Qday - RTC 6 months for repeat labs - Foot exam done today - If A1c remains stable, consider further decreasing insulin  Health care maintenance A/P: Flu vaccine given today Foot exam done today

## 2017-01-26 LAB — BMP8+ANION GAP
Anion Gap: 15 mmol/L (ref 10.0–18.0)
BUN/Creatinine Ratio: 10 (ref 10–24)
BUN: 9 mg/dL (ref 8–27)
CALCIUM: 9.1 mg/dL (ref 8.6–10.2)
CHLORIDE: 102 mmol/L (ref 96–106)
CO2: 21 mmol/L (ref 20–29)
Creatinine, Ser: 0.94 mg/dL (ref 0.76–1.27)
GFR calc Af Amer: 96 mL/min/{1.73_m2} (ref >59)
GFR, EST NON AFRICAN AMERICAN: 83 mL/min/{1.73_m2} (ref >59)
GLUCOSE: 193 mg/dL — AB (ref 65–99)
POTASSIUM: 4 mmol/L (ref 3.5–5.2)
Sodium: 138 mmol/L (ref 134–144)

## 2017-01-26 NOTE — Assessment & Plan Note (Signed)
A/P: Flu vaccine given today Foot exam done today

## 2017-01-26 NOTE — Progress Notes (Signed)
Internal Medicine Clinic Attending  Case discussed with Dr. Wallace at the time of the visit.  We reviewed the resident's history and exam and pertinent patient test results.  I agree with the assessment, diagnosis, and plan of care documented in the resident's note.  

## 2017-01-26 NOTE — Assessment & Plan Note (Signed)
BP Readings from Last 3 Encounters:  01/25/17 (!) 133/52  01/22/17 (!) 143/83  11/07/16 139/76   A: BP today is essentially at goal of SBP 130 on monotherapy with lisinopril 40mg  daily.  No complaints of HA, vision change, CP.  P: - continue lisinopril 40mg  daily - RTC 6 months - BMET today with stable renal function.

## 2017-01-26 NOTE — Assessment & Plan Note (Signed)
Lab Results  Component Value Date   HGBA1C 5.6 01/25/2017    A: Hgb A1c is stable at 5.6 from 3 months ago after decreasing his meal time Humalog.  He has not experienced any symptoms of hypoglycemia or hyperglycemia.  P: - Continue Lantus 80mg  QHS - Decrease Humalog to 26 units TID to further even out his basal to bolus dosing - Continue liraglutide 1.8mg  QDay - Continue pioglitazone 30mg  Qday - RTC 6 months for repeat labs - Foot exam done today - If A1c remains stable, consider further decreasing insulin

## 2017-02-01 NOTE — Addendum Note (Signed)
Addended by: Lianne Cure A on: 02/01/2017 03:39 PM   Modules accepted: Orders

## 2017-02-05 ENCOUNTER — Other Ambulatory Visit: Payer: Self-pay | Admitting: Internal Medicine

## 2017-02-05 DIAGNOSIS — E1142 Type 2 diabetes mellitus with diabetic polyneuropathy: Secondary | ICD-10-CM

## 2017-02-13 ENCOUNTER — Other Ambulatory Visit: Payer: Self-pay | Admitting: Internal Medicine

## 2017-03-12 ENCOUNTER — Encounter: Payer: Self-pay | Admitting: Gastroenterology

## 2017-03-14 ENCOUNTER — Other Ambulatory Visit: Payer: Self-pay | Admitting: *Deleted

## 2017-03-14 DIAGNOSIS — E7800 Pure hypercholesterolemia, unspecified: Secondary | ICD-10-CM

## 2017-03-15 MED ORDER — ATORVASTATIN CALCIUM 40 MG PO TABS: 40.0000 mg | ORAL_TABLET | Freq: Every day | ORAL | 1 refills | Status: DC

## 2017-03-21 ENCOUNTER — Other Ambulatory Visit: Payer: Self-pay | Admitting: Internal Medicine

## 2017-03-21 DIAGNOSIS — E1142 Type 2 diabetes mellitus with diabetic polyneuropathy: Secondary | ICD-10-CM

## 2017-03-27 ENCOUNTER — Other Ambulatory Visit: Payer: Self-pay | Admitting: Internal Medicine

## 2017-03-27 DIAGNOSIS — E1142 Type 2 diabetes mellitus with diabetic polyneuropathy: Secondary | ICD-10-CM

## 2017-04-02 ENCOUNTER — Other Ambulatory Visit: Payer: Self-pay

## 2017-04-03 ENCOUNTER — Other Ambulatory Visit: Payer: Medicare Other

## 2017-04-03 ENCOUNTER — Other Ambulatory Visit (HOSPITAL_COMMUNITY)
Admission: RE | Admit: 2017-04-03 | Discharge: 2017-04-03 | Disposition: A | Discharge status: Home / Self Care | Payer: Medicare Other | Source: Ambulatory Visit | Attending: Infectious Disease | Admitting: Infectious Disease

## 2017-04-03 DIAGNOSIS — B20 Human immunodeficiency virus [HIV] disease: Secondary | ICD-10-CM | POA: Insufficient documentation

## 2017-04-04 LAB — CBC WITH DIFFERENTIAL/PLATELET
Basophils Absolute: 31 cells/uL (ref 0–200)
Basophils Relative: 0.4 %
EOS PCT: 2.2 %
Eosinophils Absolute: 169 cells/uL (ref 15–500)
HCT: 36.5 % — ABNORMAL LOW (ref 38.5–50.0)
Hemoglobin: 13.3 g/dL (ref 13.2–17.1)
Lymphs Abs: 5020 cells/uL — ABNORMAL HIGH (ref 850–3900)
MCH: 40.5 pg — ABNORMAL HIGH (ref 27.0–33.0)
MCHC: 36.4 g/dL — ABNORMAL HIGH (ref 32.0–36.0)
MCV: 111.3 fL — ABNORMAL HIGH (ref 80.0–100.0)
MPV: 8.8 fL (ref 7.5–12.5)
Monocytes Relative: 6.8 %
NEUTROS PCT: 25.4 %
Neutro Abs: 1956 cells/uL (ref 1500–7800)
PLATELETS: 238 10*3/uL (ref 140–400)
RBC: 3.28 10*6/uL — AB (ref 4.20–5.80)
RDW: 13.1 % (ref 11.0–15.0)
TOTAL LYMPHOCYTE: 65.2 %
WBC mixed population: 524 cells/uL (ref 200–950)
WBC: 7.7 10*3/uL (ref 3.8–10.8)

## 2017-04-04 LAB — COMPLETE METABOLIC PANEL WITH GFR
AG RATIO: 1.9 (calc) (ref 1.0–2.5)
ALKALINE PHOSPHATASE (APISO): 45 U/L (ref 40–115)
ALT: 27 U/L (ref 9–46)
AST: 28 U/L (ref 10–35)
Albumin: 4.5 g/dL (ref 3.6–5.1)
BUN: 11 mg/dL (ref 7–25)
CALCIUM: 9.6 mg/dL (ref 8.6–10.3)
CO2: 27 mmol/L (ref 20–32)
CREATININE: 1.03 mg/dL (ref 0.70–1.25)
Chloride: 101 mmol/L (ref 98–110)
GFR, EST NON AFRICAN AMERICAN: 74 mL/min/{1.73_m2} (ref >=60)
GFR, Est African American: 86 mL/min/{1.73_m2} (ref >=60)
Globulin: 2.4 g/dL (calc) (ref 1.9–3.7)
Glucose, Bld: 114 mg/dL — ABNORMAL HIGH (ref 65–99)
POTASSIUM: 4 mmol/L (ref 3.5–5.3)
SODIUM: 138 mmol/L (ref 135–146)
Total Bilirubin: 0.6 mg/dL (ref 0.2–1.2)
Total Protein: 6.9 g/dL (ref 6.1–8.1)

## 2017-04-04 LAB — URINE CYTOLOGY ANCILLARY ONLY
CHLAMYDIA, DNA PROBE: NEGATIVE
NEISSERIA GONORRHEA: NEGATIVE

## 2017-04-04 LAB — RPR: RPR: NONREACTIVE

## 2017-04-04 LAB — T-HELPER CELL (CD4) - (RCID CLINIC ONLY)
CD4 T CELL ABS: 990 /uL (ref 400–2700)
CD4 T CELL HELPER: 18 % — AB (ref 33–55)

## 2017-04-05 LAB — HIV-1 RNA QUANT-NO REFLEX-BLD
HIV 1 RNA Quant: 56 copies/mL — ABNORMAL HIGH
HIV-1 RNA Quant, Log: 1.75 Log copies/mL — ABNORMAL HIGH

## 2017-04-11 ENCOUNTER — Other Ambulatory Visit: Payer: Self-pay | Admitting: Infectious Disease

## 2017-04-11 ENCOUNTER — Encounter: Payer: Self-pay | Admitting: Infectious Disease

## 2017-04-11 ENCOUNTER — Ambulatory Visit (INDEPENDENT_AMBULATORY_CARE_PROVIDER_SITE_OTHER): Payer: Medicare Other | Admitting: Infectious Disease

## 2017-04-11 VITALS — BP 133/75 | HR 83 | Temp 97.9°F | Wt 180.0 lb

## 2017-04-11 DIAGNOSIS — R64 Cachexia: Secondary | ICD-10-CM

## 2017-04-11 DIAGNOSIS — I10 Essential (primary) hypertension: Secondary | ICD-10-CM

## 2017-04-11 DIAGNOSIS — B20 Human immunodeficiency virus [HIV] disease: Secondary | ICD-10-CM

## 2017-04-11 DIAGNOSIS — E1142 Type 2 diabetes mellitus with diabetic polyneuropathy: Secondary | ICD-10-CM | POA: Diagnosis not present

## 2017-04-11 DIAGNOSIS — I70213 Atherosclerosis of native arteries of extremities with intermittent claudication, bilateral legs: Secondary | ICD-10-CM

## 2017-04-11 DIAGNOSIS — I779 Disorder of arteries and arterioles, unspecified: Secondary | ICD-10-CM

## 2017-04-11 MED ORDER — BICTEGRAVIR-EMTRICITAB-TENOFOV 50-200-25 MG PO TABS: 1.0000 | ORAL_TABLET | Freq: Every day | ORAL | 11 refills | Status: DC

## 2017-04-11 NOTE — Patient Instructions (Signed)
Dakari we are changing you to   BIKTARVY one pill once daily AND  Zidovudine (retrovir) one tablet TWICE A DAY   You can stop the Isentress and Descovy  Please come back for repeat blood work in 4 weeks and then visit with me after that

## 2017-04-11 NOTE — Progress Notes (Signed)
Chief complaint: followup for HIV on meds  Subjective:    Patient ID: Nathan [name], male    DOB: 15-Aug-1950, 66 y.o.   MRN: [id number]  HPI  Nathan [name] is a 66 y.o. male whose HIV has been perfectly suppressed with salvage regimen of  twice daily combivir, isentress and once daily viread with undetectable viral load and health cd4 count. (he has EXTENSIVE R with R to all NNRTI, R to nearly all PI except DRV, with still activity from TNF, AZT and the isentress, changed to BID AZT, ISENTRESS, and daily Descovy.  Lab Results  Component Value Date   HIV1RNAQUANT 29 (H) 04/03/2017   HIV1RNAQUANT <20 01/05/2016   HIV1RNAQUANT <20 03/17/2015    Lab Results  Component Value Date   CD4TABS 990 04/03/2017   CD4TABS 940 01/05/2016   CD4TABS 630 03/17/2015    Nathan [name] also has comorbid PVD and underwent an above-the-knee and dictation was stenting of his vessels  Today I offered to simplify Nathan [name] to [facility] and AZT and he was agreeable to this.    Past Medical History:  Diagnosis Date  . Asthma    per 2003 [facility] pulm records pfts   . Blood dyscrasia    HIV  . Clotting disorder ([place])   . Colon polyps    noted previous colonoscopy [facility]  . DDD (degenerative disc disease)    cervical spine  . Depression   . Diabetes mellitus    dx 2010  . Fever    unknwon origin  . GERD (gastroesophageal reflux disease)   . Hep C w/o coma, chronic ([place])   . History of syphilis    noted [facility] records  . HIV infection ([place])    undetectable viral load and CD4 ct 667 as of 11/2011  . Hyperlipidemia   . Hypertension   . MRSA (methicillin resistant Staphylococcus aureus)   . Multiple sclerosis ([place])    in remission as of 11/2011 (diagnosed late 1980s)  . Pain in limb-Right Leg 10/13/2013  . 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[place])    2002  . Pneumonia   . PVD (peripheral vascular disease) ([place])    Left Stent 07/02/2008  . UTI (urinary tract infection)     Past Surgical History:   Procedure Laterality Date  . ABOVE KNEE LEG AMPUTATION     Right Leg  . LOWER EXTREMITY ANGIOGRAM Left 12/26/2011   Procedure: LOWER EXTREMITY ANGIOGRAM;  Surgeon: Serafina Mitchell, MD;  Location: [facility] CATH LAB;  Service: Cardiovascular;  Laterality: Left;  . OTHER SURGICAL HISTORY     right lower ext AKA with prothesis   . OTHER SURGICAL HISTORY     left left with stents (Dr. Seward Speck)  . OTHER SURGICAL HISTORY     2003 colonoscopy 5 mm polyp transverse colon; (2)3m  polyps in rectum-hyperplastic    Family History  Problem Relation Age of Onset  . Diabetes Mother   . Coronary artery disease Mother   . Cancer Brother        colon caner stage 4 as of 11/2011 (unknown age of onset)  . Coronary artery disease Father   . Prostate cancer Brother       Social History   Socioeconomic History  . Marital status: Single    Spouse name: None  . Number of children: None  . Years of education: 139 . Highest education level: None  Social Needs  . Financial resource strain: None  . Food insecurity - worry: None  .  Food insecurity - inability: None  . Transportation needs - medical: None  . Transportation needs - non-medical: None  Occupational History  . None  Tobacco Use  . Smoking status: Former Smoker    Last attempt to quit: 05/09/2007    Years since quitting: 9.9  . Smokeless tobacco: Never Used  Substance and Sexual Activity  . Alcohol use: No    Alcohol/week: 0.0 oz  . Drug use: No  . Sexual activity: Yes    Partners: Male  Other Topics Concern  . None  Social History Narrative  . None    Allergies  Allergen Reactions  . Sulfonamide Derivatives Hives     Current Outpatient Medications:  .  atorvastatin (LIPITOR) 40 MG tablet, Take 1 tablet (40 mg total) daily by mouth., Disp: 90 tablet, Rfl: 1 .  clopidogrel (PLAVIX) 75 MG tablet, TAKE 1 TABLET BY MOUTH DAILY, Disp: 90 tablet, Rfl: 5 .  emtricitabine-tenofovir AF (DESCOVY) 200-25 MG tablet, Take 1 tablet  by mouth daily., Disp: 30 tablet, Rfl: 5 .  ibuprofen (ADVIL,MOTRIN) 200 MG tablet, Take 400-600 mg by mouth every 6 (six) hours as needed., Disp: , Rfl:  .  Insulin Glargine (LANTUS SOLOSTAR) 100 UNIT/ML Solostar Pen, Inject 80 Units into the skin daily at 10 pm. Diagnosis code  E11.42, Disp: 15 mL, Rfl: 11 .  insulin lispro (HUMALOG KWIKPEN) 100 UNIT/ML KiwkPen, Inject 0.26 mLs (26 Units total) into the skin 3 (three) times daily with meals., Disp: 30 mL, Rfl: 11 .  Lancet Devices (BAYER MICROLET 2 LANCING DEVIC) MISC, Use to check blood sugar up to 3 ties a day, Disp: 1 each, Rfl: 2 .  lisinopril (PRINIVIL,ZESTRIL) 40 MG tablet, TAKE 1 TABLET BY MOUTH DAILY, Disp: 90 tablet, Rfl: 1 .  omeprazole (PRILOSEC) 40 MG capsule, TAKE ONE CAPSULE BY MOUTH EVERY DAY, Disp: 90 capsule, Rfl: 1 .  pioglitazone (ACTOS) 30 MG tablet, TAKE 1 TABLET BY MOUTH EVERY DAY, Disp: 90 tablet, Rfl: 1 .  raltegravir (ISENTRESS) 400 MG tablet, TAKE 1 TABLET(400 MG) BY MOUTH TWICE DAILY, Disp: 60 tablet, Rfl: 5 .  VICTOZA 18 MG/3ML SOPN, INJECT 1.8MG UNDER THE SKIN EVERY MORNING, Disp: 9 mL, Rfl: 3 .  zidovudine (RETROVIR) 300 MG tablet, TAKE 1 TABLET(300 MG) BY MOUTH TWICE DAILY, Disp: 60 tablet, Rfl: 8 .  BAYER CONTOUR NEXT TEST test strip, USE TO TEST BLOOD SUGAR FOUR TIMES DAILY BEFORE MEALS AND AT BEDTIME, Disp: 100 each, Rfl: 11 .  BD PEN NEEDLE NANO U/F 32G X 4 MM MISC, USE AS DIRECTED FOUR TIMES DAILY, Disp: 100 each, Rfl: 5 .  Blood Glucose Monitoring Suppl (BAYER CONTOUR MONITOR) W/DEVICE KIT, Use to check blood sugar as instructed up to 4 times a day, Disp: 1 kit, Rfl: 0 .  Lancets MISC, Use to Test Blood Sugar Three Times Daily. Dx Code: 250.02, Disp: 100 each, Rfl: 3  Review of Systems  Constitutional: Negative for activity change, appetite change, chills, diaphoresis and unexpected weight change.  HENT: Negative for sinus pressure, sneezing and trouble swallowing.   Eyes: Negative for photophobia and visual  disturbance.  Respiratory: Negative for chest tightness and stridor.   Cardiovascular: Negative for palpitations and leg swelling.  Gastrointestinal: Negative for abdominal distention, anal bleeding, blood in stool and constipation.  Genitourinary: Negative for difficulty urinating, dysuria, flank pain and hematuria.  Musculoskeletal: Positive for arthralgias and myalgias. Negative for back pain, gait problem and joint swelling.  Skin: Negative for color change, pallor and  wound.  Neurological: Negative for dizziness, tremors, weakness and light-headedness.  Hematological: Negative for adenopathy. Does not bruise/bleed easily.  Psychiatric/Behavioral: Negative for agitation, behavioral problems, confusion, decreased concentration, dysphoric mood and sleep disturbance.       Objective:   Physical Exam  Constitutional: He is oriented to person, place, and time. He appears well-developed and well-nourished. No distress.  HENT:  Head: Normocephalic and atraumatic.  Mouth/Throat: Oropharynx is clear and moist. No oropharyngeal exudate.  Eyes: Conjunctivae and EOM are normal. No scleral icterus.  Neck: Normal range of motion. Neck supple.  Cardiovascular: Normal rate and regular rhythm.  Pulmonary/Chest: Effort normal. No respiratory distress. He has no wheezes.  Abdominal: He exhibits no distension.  Musculoskeletal: He exhibits no edema or tenderness.  Neurological: He is alert and oriented to person, place, and time.  Skin: Skin is warm and dry. He is not diaphoretic. No erythema. No pallor.  Psychiatric: He has a normal mood and affect. His behavior is normal. Judgment and thought content normal.  Nursing note and vitals reviewed.       Assessment & Plan:    HIV: highly R virus.   Change to daily with BID AZT. Consider going to [facility] alone but with re-examination of his genotypes and consideration of doing genosure archive to ensure no K65R or other TNF R, recheck labs in one  month  Disseminated syphilis with hepatitis: NON-REACTIVE (11/27 1608) resolved   DM: followed closely in IM clinic  PVD followed by VVS  HTN: followed by PCP  Vitals:   04/11/17 1547  BP: 133/75  Pulse: 83  Temp: 97.9 F (36.6 C)    I spent greater than 25 minutes with the patient including greater than 50% of time in face to face counsel of the patient and in coordination of his care with specific review of his current regimen and future regimen and emphasizing caution we have been using but confidence I have  That BIKTARVY and AZT and potentially BIKARVY alone will  Be sufficiently potent.

## 2017-05-10 ENCOUNTER — Other Ambulatory Visit: Payer: Medicare Other

## 2017-05-10 ENCOUNTER — Other Ambulatory Visit: Payer: Self-pay | Admitting: Internal Medicine

## 2017-05-10 DIAGNOSIS — E1142 Type 2 diabetes mellitus with diabetic polyneuropathy: Secondary | ICD-10-CM

## 2017-05-10 DIAGNOSIS — R64 Cachexia: Secondary | ICD-10-CM | POA: Diagnosis not present

## 2017-05-10 DIAGNOSIS — B20 Human immunodeficiency virus [HIV] disease: Secondary | ICD-10-CM

## 2017-05-11 LAB — T-HELPER CELL (CD4) - (RCID CLINIC ONLY)
CD4 T CELL ABS: 700 /uL (ref 400–2700)
CD4 T CELL HELPER: 19 % — AB (ref 33–55)

## 2017-05-14 LAB — HIV-1 RNA QUANT-NO REFLEX-BLD
HIV 1 RNA Quant: <20 copies/mL
HIV-1 RNA Quant, Log: <1.3 Log copies/mL

## 2017-05-23 ENCOUNTER — Ambulatory Visit (INDEPENDENT_AMBULATORY_CARE_PROVIDER_SITE_OTHER): Payer: Medicare Other | Admitting: Infectious Disease

## 2017-05-23 ENCOUNTER — Encounter: Payer: Self-pay | Admitting: Infectious Disease

## 2017-05-23 ENCOUNTER — Other Ambulatory Visit: Payer: Self-pay | Admitting: Internal Medicine

## 2017-05-23 VITALS — BP 167/73 | HR 76 | Temp 97.7°F | Ht 73.0 in | Wt 180.0 lb

## 2017-05-23 DIAGNOSIS — A539 Syphilis, unspecified: Secondary | ICD-10-CM

## 2017-05-23 DIAGNOSIS — B20 Human immunodeficiency virus [HIV] disease: Secondary | ICD-10-CM

## 2017-05-23 DIAGNOSIS — R64 Cachexia: Secondary | ICD-10-CM

## 2017-05-23 DIAGNOSIS — E1142 Type 2 diabetes mellitus with diabetic polyneuropathy: Secondary | ICD-10-CM | POA: Diagnosis not present

## 2017-05-23 DIAGNOSIS — E162 Hypoglycemia, unspecified: Secondary | ICD-10-CM

## 2017-05-23 DIAGNOSIS — R220 Localized swelling, mass and lump, head: Secondary | ICD-10-CM

## 2017-05-23 HISTORY — DX: Localized swelling, mass and lump, head: R22.0

## 2017-05-23 NOTE — Progress Notes (Signed)
Chief complaint: followup for HIV on meds, also with complaint of swelling in the back of his head  Subjective:    Patient ID: Nathan [name], male    DOB: 12/27/1950, 67 y.o.   MRN: [id number]  HPI  Nathan [name] is a 67 y.o. male whose HIV has been perfectly suppressed with salvage regimen of  twice daily combivir, isentress and once daily viread with undetectable viral load and health cd4 count. (he has EXTENSIVE R with R to all NNRTI, R to nearly all PI except DRV, with still activity from TNF, AZT and the isentress, changed to BID AZT, ISENTRESS, and daily Descovy, now changed to BIKTARVY once daily and twice daily AZT.  Lab Results  Component Value Date   HIV1RNAQUANT <20 NOT DETECTED 05/10/2017   HIV1RNAQUANT 56 (H) 04/03/2017   HIV1RNAQUANT <20 01/05/2016    Lab Results  Component Value Date   CD4TABS 700 05/10/2017   CD4TABS 990 04/03/2017   CD4TABS 940 01/05/2016    Nathan [name] has been told by friends that he has swelling in the back of his head and he is now noticing this himself and shows me this area on his posterior scalp where he has a palpable swelling.  He does not recall trauma to the area has not had fevers and there is been no drainage from it.  The area in question is asymmetric and more prominent on the right.  It does not feel overtly fluctuant seems to be fairly consistently a soft tissue type of structure.  He tells me that it is developed over the last week or 2 which is fairly surprising given how large this area is.   Past Medical History:  Diagnosis Date  . Asthma    per 2003 [facility] pulm records pfts   . Blood dyscrasia    HIV  . Clotting disorder ([place])   . Colon polyps    noted previous colonoscopy [facility]  . DDD (degenerative disc disease)    cervical spine  . Depression   . Diabetes mellitus    dx 2010  . Fever    unknwon origin  . GERD (gastroesophageal reflux disease)   . Hep C w/o coma, chronic ([place])   . History of syphilis    noted [facility]  records  . HIV infection ([place])    undetectable viral load and CD4 ct 667 as of 11/2011  . Hyperlipidemia   . Hypertension   . MRSA (methicillin resistant Staphylococcus aureus)   . Multiple sclerosis ([place])    in remission as of 11/2011 (diagnosed late 1980s)  . Pain in limb-Right Leg 10/13/2013  . 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[place])    2002  . Pneumonia   . PVD (peripheral vascular disease) ([place])    Left Stent 07/02/2008  . UTI (urinary tract infection)     Past Surgical History:  Procedure Laterality Date  . ABOVE KNEE LEG AMPUTATION     Right Leg  . LOWER EXTREMITY ANGIOGRAM Left 12/26/2011   Procedure: LOWER EXTREMITY ANGIOGRAM;  Surgeon: Serafina Mitchell, MD;  Location: [facility] CATH LAB;  Service: Cardiovascular;  Laterality: Left;  . OTHER SURGICAL HISTORY     right lower ext AKA with prothesis   . OTHER SURGICAL HISTORY     left left with stents (Dr. Seward Speck)  . OTHER SURGICAL HISTORY     2003 colonoscopy 5 mm polyp transverse colon; (2)69m  polyps in rectum-hyperplastic    Family History  Problem Relation Age of Onset  .  Diabetes Mother   . Coronary artery disease Mother   . Cancer Brother        colon caner stage 4 as of 11/2011 (unknown age of onset)  . Coronary artery disease Father   . Prostate cancer Brother       Social History   Socioeconomic History  . Marital status: Single    Spouse name: Not on file  . Number of children: Not on file  . Years of education: 72  . Highest education level: Not on file  Social Needs  . Financial resource strain: Not on file  . Food insecurity - worry: Not on file  . Food insecurity - inability: Not on file  . Transportation needs - medical: Not on file  . Transportation needs - non-medical: Not on file  Occupational History  . Not on file  Tobacco Use  . Smoking status: Former Smoker    Last attempt to quit: 05/09/2007    Years since quitting: 10.0  . Smokeless tobacco: Never Used  Substance and Sexual  Activity  . Alcohol use: No    Alcohol/week: 0.0 oz  . Drug use: No  . Sexual activity: Yes    Partners: Male  Other Topics Concern  . Not on file  Social History Narrative  . Not on file    Allergies  Allergen Reactions  . Sulfonamide Derivatives Hives     Current Outpatient Medications:  .  atorvastatin (LIPITOR) 40 MG tablet, Take 1 tablet (40 mg total) daily by mouth., Disp: 90 tablet, Rfl: 1 .  BAYER CONTOUR NEXT TEST test strip, USE TO TEST BLOOD SUGAR FOUR TIMES DAILY BEFORE MEALS AND AT BEDTIME, Disp: 100 each, Rfl: 11 .  bictegravir-emtricitabine-tenofovir AF (BIKTARVY) 50-200-25 MG TABS tablet, Take 1 tablet by mouth daily., Disp: 30 tablet, Rfl: 11 .  Blood Glucose Monitoring Suppl (BAYER CONTOUR MONITOR) W/DEVICE KIT, Use to check blood sugar as instructed up to 4 times a day, Disp: 1 kit, Rfl: 0 .  clopidogrel (PLAVIX) 75 MG tablet, TAKE 1 TABLET BY MOUTH DAILY, Disp: 90 tablet, Rfl: 5 .  ibuprofen (ADVIL,MOTRIN) 200 MG tablet, Take 400-600 mg by mouth every 6 (six) hours as needed., Disp: , Rfl:  .  Insulin Glargine (LANTUS SOLOSTAR) 100 UNIT/ML Solostar Pen, Inject 80 Units into the skin daily at 10 pm. Diagnosis code  E11.42, Disp: 15 mL, Rfl: 11 .  insulin lispro (HUMALOG KWIKPEN) 100 UNIT/ML KiwkPen, Inject 0.26 mLs (26 Units total) into the skin 3 (three) times daily with meals., Disp: 30 mL, Rfl: 11 .  Insulin Pen Needle (BD PEN NEEDLE NANO U/F) 32G X 4 MM MISC, USE AS DIRECTED FOUR TIMES DAILY. Dx code  E11.42, Disp: 100 each, Rfl: 5 .  Lancet Devices (BAYER MICROLET 2 LANCING DEVIC) MISC, Use to check blood sugar up to 3 ties a day, Disp: 1 each, Rfl: 2 .  Lancets MISC, Use to Test Blood Sugar Three Times Daily. Dx Code: 250.02, Disp: 100 each, Rfl: 3 .  lisinopril (PRINIVIL,ZESTRIL) 40 MG tablet, TAKE 1 TABLET BY MOUTH DAILY, Disp: 90 tablet, Rfl: 1 .  omeprazole (PRILOSEC) 40 MG capsule, TAKE ONE CAPSULE BY MOUTH EVERY DAY, Disp: 90 capsule, Rfl: 1 .   pioglitazone (ACTOS) 30 MG tablet, TAKE 1 TABLET BY MOUTH EVERY DAY, Disp: 90 tablet, Rfl: 1 .  VICTOZA 18 MG/3ML SOPN, INJECT 1.8MG UNDER THE SKIN EVERY MORNING, Disp: 9 mL, Rfl: 3 .  zidovudine (RETROVIR) 300 MG tablet, TAKE 1  TABLET(300 MG) BY MOUTH TWICE DAILY, Disp: 60 tablet, Rfl: 8  Review of Systems  Constitutional: Negative for activity change, appetite change, chills, diaphoresis and unexpected weight change.  HENT: Negative for sinus pressure, sneezing and trouble swallowing.   Eyes: Negative for photophobia and visual disturbance.  Respiratory: Negative for chest tightness and stridor.   Cardiovascular: Negative for palpitations and leg swelling.  Gastrointestinal: Negative for abdominal distention, anal bleeding, blood in stool and constipation.  Genitourinary: Negative for difficulty urinating, dysuria, flank pain and hematuria.  Musculoskeletal: Positive for arthralgias and myalgias. Negative for back pain, gait problem and joint swelling.  Skin: Negative for color change, pallor and wound.  Neurological: Negative for dizziness, tremors, weakness and light-headedness.  Hematological: Negative for adenopathy. Does not bruise/bleed easily.  Psychiatric/Behavioral: Negative for agitation, behavioral problems, confusion, decreased concentration, dysphoric mood and sleep disturbance.       Objective:   Physical Exam  Constitutional: He is oriented to person, place, and time. He appears well-developed and well-nourished. No distress.  HENT:  Head: Atraumatic.  Mouth/Throat: Oropharynx is clear and moist. No oropharyngeal exudate.  Eyes: Conjunctivae and EOM are normal. No scleral icterus.  Neck: Normal range of motion. Neck supple.  Cardiovascular: Normal rate and regular rhythm.  Pulmonary/Chest: Effort normal. No respiratory distress. He has no wheezes.  Abdominal: He exhibits no distension.  Musculoskeletal: He exhibits no edema or tenderness.  Neurological: He is alert  and oriented to person, place, and time.  Skin: Skin is warm and dry. He is not diaphoretic. No erythema. No pallor.  Psychiatric: He has a normal mood and affect. His behavior is normal. Judgment and thought content normal.  Nursing note and vitals reviewed.  Posterior scalp and neck lesion May 23, 2017:        Assessment & Plan:   Scalp lesion: I am ordering a CT with and without contrast to evaluate this area  HIV: highly R virus: We will continue daily BIKTARVY for now and twice daily AZT and we will check a general sure archive   Disseminated syphilis with hepatitis: NON-REACTIVE (11/27 1608) resolved   DM: followed closely in IM clinic  PVD followed by [facility]VS  HTN: followed by PCP  There were no vitals filed for this visit.   I spent greater than 25 minutes with the patient including greater than 50% of time in face to face counsel of bili with regards to his current antiretroviral regimen concerns about resistant mutation but I would like to look for with his genosure archive test, workup for his scalp lesion and in coordination of his care.

## 2017-05-30 ENCOUNTER — Telehealth: Payer: Self-pay | Admitting: *Deleted

## 2017-05-30 ENCOUNTER — Inpatient Hospital Stay: Admission: RE | Admit: 2017-05-30 | Payer: Self-pay | Source: Ambulatory Visit

## 2017-05-30 NOTE — Telephone Encounter (Signed)
Received message in triage stating patient no-showed today's CT.  RN called patient. He states he cancelled the appointment (and confirmed cancellation) because the copay was too high and he felt the area was getting smaller/didn't trouble him at all.  RN confirmed with [facility] that he will not get charged for a no show. Landis Gandy, RN

## 2017-05-31 NOTE — Telephone Encounter (Signed)
Thanks Michelle

## 2017-06-01 ENCOUNTER — Other Ambulatory Visit: Payer: Self-pay | Admitting: Internal Medicine

## 2017-06-01 DIAGNOSIS — E1142 Type 2 diabetes mellitus with diabetic polyneuropathy: Secondary | ICD-10-CM

## 2017-06-01 NOTE — Telephone Encounter (Signed)
Next appt scheduled  07/26/17 with PCP.

## 2017-06-22 ENCOUNTER — Other Ambulatory Visit: Payer: Self-pay | Admitting: Internal Medicine

## 2017-06-22 DIAGNOSIS — E1142 Type 2 diabetes mellitus with diabetic polyneuropathy: Secondary | ICD-10-CM

## 2017-06-26 ENCOUNTER — Other Ambulatory Visit: Payer: Self-pay | Admitting: Pharmacist

## 2017-06-26 ENCOUNTER — Telehealth: Payer: Self-pay | Admitting: Infectious Disease

## 2017-06-26 NOTE — Telephone Encounter (Signed)
No problem - I'll look into this and have Raquel Sarna do it as well!

## 2017-06-26 NOTE — Telephone Encounter (Signed)
Excellent

## 2017-06-26 NOTE — Telephone Encounter (Signed)
Nathan [name]'s GENOSURE ARCHIVE came back:      Need to combine this with his existing conventional genotypes. I believe he was patient of [facility] so there may be [name] scanned documents that include prior conventional genotypes as well

## 2017-07-01 ENCOUNTER — Other Ambulatory Visit: Payer: Self-pay | Admitting: Internal Medicine

## 2017-07-01 DIAGNOSIS — E1142 Type 2 diabetes mellitus with diabetic polyneuropathy: Secondary | ICD-10-CM

## 2017-07-05 ENCOUNTER — Other Ambulatory Visit: Payer: Self-pay | Admitting: Internal Medicine

## 2017-07-05 ENCOUNTER — Other Ambulatory Visit: Payer: Self-pay | Admitting: Infectious Disease

## 2017-07-05 DIAGNOSIS — E1165 Type 2 diabetes mellitus with hyperglycemia: Principal | ICD-10-CM

## 2017-07-05 DIAGNOSIS — IMO0002 Reserved for concepts with insufficient information to code with codable children: Secondary | ICD-10-CM

## 2017-07-05 DIAGNOSIS — E7800 Pure hypercholesterolemia, unspecified: Secondary | ICD-10-CM

## 2017-07-05 DIAGNOSIS — E1142 Type 2 diabetes mellitus with diabetic polyneuropathy: Secondary | ICD-10-CM

## 2017-07-16 ENCOUNTER — Encounter: Payer: Self-pay | Admitting: Infectious Disease

## 2017-07-23 ENCOUNTER — Other Ambulatory Visit: Payer: Self-pay | Admitting: *Deleted

## 2017-07-23 ENCOUNTER — Encounter: Payer: Self-pay | Admitting: *Deleted

## 2017-07-23 ENCOUNTER — Ambulatory Visit (INDEPENDENT_AMBULATORY_CARE_PROVIDER_SITE_OTHER): Payer: Medicare Other | Admitting: Family

## 2017-07-23 ENCOUNTER — Encounter: Payer: Self-pay | Admitting: Family

## 2017-07-23 ENCOUNTER — Ambulatory Visit (HOSPITAL_COMMUNITY)
Admission: RE | Admit: 2017-07-23 | Discharge: 2017-07-23 | Disposition: A | Discharge status: Home / Self Care | Payer: Medicare Other | Source: Ambulatory Visit | Attending: Family | Admitting: Family

## 2017-07-23 ENCOUNTER — Encounter (HOSPITAL_COMMUNITY): Payer: Self-pay

## 2017-07-23 ENCOUNTER — Ambulatory Visit (INDEPENDENT_AMBULATORY_CARE_PROVIDER_SITE_OTHER)
Admission: RE | Admit: 2017-07-23 | Discharge: 2017-07-23 | Disposition: A | Discharge status: Home / Self Care | Payer: Medicare Other | Source: Ambulatory Visit | Attending: Family | Admitting: Family

## 2017-07-23 VITALS — BP 130/75 | HR 70 | Temp 97.1°F | Resp 18 | Ht 73.0 in | Wt 182.7 lb

## 2017-07-23 DIAGNOSIS — Z87891 Personal history of nicotine dependence: Secondary | ICD-10-CM

## 2017-07-23 DIAGNOSIS — Z89611 Acquired absence of right leg above knee: Secondary | ICD-10-CM | POA: Insufficient documentation

## 2017-07-23 DIAGNOSIS — I70202 Unspecified atherosclerosis of native arteries of extremities, left leg: Secondary | ICD-10-CM | POA: Diagnosis not present

## 2017-07-23 DIAGNOSIS — I779 Disorder of arteries and arterioles, unspecified: Secondary | ICD-10-CM

## 2017-07-23 DIAGNOSIS — I6523 Occlusion and stenosis of bilateral carotid arteries: Secondary | ICD-10-CM | POA: Diagnosis not present

## 2017-07-23 NOTE — H&P (View-Only) (Signed)
[facility] [place]   CC: Follow up peripheral artery occlusive disease  History of Present Illness Nathan [name] is a 67 y.o. male who is s/p left SFA stenting in 2010. He is s/p right AKA in 2009.  Dr. Trula Slade performed an aortogram in 2013 to evaluate LE perfusion.  Pt is ambulating with a right above-knee prosthesis. He worked with [employer] to address the moderate pain he was having at the bony prominence of the right AKA stump with walking, and had resolution of this pain.  He has stinging and burning in foot up to left knee, attributes to neuropathy.  Has occassional left calf pain with walking various distances which is worsening, denies non healing wounds.  Pt denies any history of stroke or TIA.  He was evaluated by K. Trinh, PA-C, on 07-17-16. At that time left SFA stent was patent and ABIs stable. He continued to have some neuropathic type symptoms in the left leg and foot. Ambulating well with right leg prosthesis. Pt advised to continue maximal medical management with diabetes control, plavix and statin. Follow up in one year with repeat ABIs and left leg arterial duplex.  Carotid duplex demonstrated 60-79% bilateral carotid stenosis, he had been asymptomatic. His intermittent bilateral eye blurriness resolved with blinking and had been unchanged for the prior several years. Did not appear to be amaurosis fugax. Did discuss with Dr. Trula Slade that he is below the 80% threshold for surgical intervention. Pt was to follow up in 6 months with repeat carotid duplex. The patient knows to seek emergency assistance if he develops any TIA or stroke symptoms.   He feels that the pain in his left calf is worsening with walking, pain develops at varying walking distances.   Pt Diabetic: Yes, 5.6 A1C on 01-25-17,  improved from over 8 per pt. Pt smoker: former smoker, quit in 2009  Pt meds include: Statin :Yes ASA: No Other anticoagulants/antiplatelets: Plavix     Past Medical History:  Diagnosis Date  . Asthma    per 2003 [facility] pulm records pfts   . Blood dyscrasia    HIV  . Clotting disorder ([place])   . Colon polyps    noted previous colonoscopy [facility]  . DDD (degenerative disc disease)    cervical spine  . Depression   . Diabetes mellitus    dx 2010  . Fever    unknwon origin  . GERD (gastroesophageal reflux disease)   . Head swelling 05/23/2017  . Hep C w/o coma, chronic ([place])   . History of syphilis    noted [facility] records  . HIV infection ([place])    undetectable viral load and CD4 ct 667 as of 11/2011  . Hyperlipidemia   . Hypertension   . MRSA (methicillin resistant Staphylococcus aureus)   . Multiple sclerosis ([place])    in remission as of 11/2011 (diagnosed late 1980s)  . Pain in limb-Right Leg 10/13/2013  . 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[place])    2002  . Pneumonia   . PVD (peripheral vascular disease) ([place])    Left Stent 07/02/2008  . UTI (urinary tract infection)     Social History Social History   Tobacco Use  . Smoking status: Former Smoker    Last attempt to quit: 05/09/2007    Years since quitting: 10.2  . Smokeless tobacco: Never Used  Substance Use Topics  . Alcohol use: No    Alcohol/week: 0.0 oz  . Drug use: No    Family  History Family History  Problem Relation Age of Onset  . Diabetes Mother   . Coronary artery disease Mother   . Cancer Brother        colon caner stage 4 as of 11/2011 (unknown age of onset)  . Coronary artery disease Father   . Prostate cancer Brother     Past Surgical History:  Procedure Laterality Date  . ABOVE KNEE LEG AMPUTATION     Right Leg  . LOWER EXTREMITY ANGIOGRAM Left 12/26/2011   Procedure: LOWER EXTREMITY ANGIOGRAM;  Surgeon: Serafina Mitchell, MD;  Location: [facility] CATH LAB;  Service: Cardiovascular;  Laterality: Left;  . OTHER SURGICAL HISTORY     right lower ext AKA with prothesis   . OTHER SURGICAL HISTORY     left left with stents (Dr. Seward Speck)  . OTHER  SURGICAL HISTORY     2003 colonoscopy 5 mm polyp transverse colon; (2)32m  polyps in rectum-hyperplastic    Allergies  Allergen Reactions  . Sulfonamide Derivatives Hives    Current Outpatient Medications  Medication Sig Dispense Refill  . atorvastatin (LIPITOR) 40 MG tablet Take 1 tablet (40 mg total) daily by mouth. 90 tablet 1  . bictegravir-emtricitabine-tenofovir AF (BIKTARVY) 50-200-25 MG TABS tablet Take 1 tablet by mouth daily. 30 tablet 11  . Blood Glucose Monitoring Suppl (BAYER CONTOUR MONITOR) W/DEVICE KIT Use to check blood sugar as instructed up to 4 times a day 1 kit 0  . clopidogrel (PLAVIX) 75 MG tablet TAKE 1 TABLET BY MOUTH DAILY 90 tablet 5  . CONTOUR NEXT TEST test strip TEST BLOOD SUGAR FOUR TIMES DAILY BEFORE MEALS AND AT BEDTIME 100 each 5  . ibuprofen (ADVIL,MOTRIN) 200 MG tablet Take 400-600 mg by mouth every 6 (six) hours as needed.    . Insulin Glargine (LANTUS SOLOSTAR) 100 UNIT/ML Solostar Pen Inject 80 Units into the skin daily at 10 pm. Diagnosis code  E11.42 15 mL 11  . insulin lispro (HUMALOG KWIKPEN) 100 UNIT/ML KiwkPen Inject 0.26 mLs (26 Units total) into the skin 3 (three) times daily with meals. 30 mL 11  . Insulin Pen Needle (BD PEN NEEDLE NANO U/F) 32G X 4 MM MISC USE AS DIRECTED FOUR TIMES DAILY. Dx code  E11.42 100 each 5  . Lancet Devices (BAYER MICROLET 2 LANCING DEVIC) MISC Use to check blood sugar up to 3 ties a day 1 each 2  . lisinopril (PRINIVIL,ZESTRIL) 40 MG tablet TAKE 1 TABLET BY MOUTH DAILY 90 tablet 1  . omeprazole (PRILOSEC) 40 MG capsule TAKE ONE CAPSULE BY MOUTH EVERY DAY 90 capsule 1  . pioglitazone (ACTOS) 30 MG tablet TAKE 1 TABLET BY MOUTH EVERY DAY 90 tablet 1  . VICTOZA 18 MG/3ML SOPN INJECT 1.8MG UNDER THE SKIN EVERY MORNING 9 mL 1  . zidovudine (RETROVIR) 300 MG tablet TAKE 1 TABLET(300 MG) BY MOUTH TWICE DAILY 60 tablet 8  . Lancets MISC Use to Test Blood Sugar Three Times Daily. Dx Code: 250.02 100 each 3   No current  facility-administered medications for this visit.     ROS: See HPI for pertinent positives and negatives.   Physical Examination  Vitals:   07/23/17 1149 07/23/17 1151  BP: 128/72 130/75  Pulse: 70   Resp: 18   Temp: (!) 97.1 F (36.2 C)   TempSrc: Oral   SpO2: 95%   Weight: 182 lb 11.2 oz (82.9 kg)   Height: _0  (1.854 m)    Body mass index is 24.1 kg/m.  General: A&O x 3, WDWN male. Gait: using right AKA prosthesis, steady Eyes: PERRLA. HENT: No gross abnormalities  Pulmonary: Respirations are non labored, CTAB, without wheezes, rales, or rhonchi. Cardiac: regular rythm, no detected murmur.   Carotid Bruits Left Right   Negative negative   Abdominal aortic pulse is not palpable. Radial pulses: are 2+ palpable and =   VASCULAR EXAM: Extremitieswithoutischemic changes  withoutGangrene; withoutopen wounds. All left toes are pink and warm with brisk capillary refill.  Right AKA prosthesis in place.   LE Pulses LEFT RIGHT  FEMORAL 2+ palpable not palpable   POPLITEAL not palpable  AKA  POSTERIOR TIBIAL not palpable  AKA   DORSALIS PEDIS ANTERIOR TIBIAL not palpable  AKA    Abdomen: soft, NT, normal bowel sounds, no palpated masses. Skin: no rashes, no cellulitis, no ulcers noted. Musculoskeletal: no muscle wasting or atrophy.  Neurologic: A&O X 3; appropriate affect, Sensation is normal; MOTOR FUNCTION:  moving all extremities equally, motor strength 5/5 throughout. Speech is fluent/normal. CN 2-12 intact. Psychiatric: Thought content is normal, mood appropriate for clinical situation.     ASSESSMENT: EGE MUCKEY is a 67 y.o. male who is status post s/p left SFA stenting in 2010. He is s/p right AKA in 2009.  He is ambulating with a right above-knee prosthesis. Has stinging and burning in foot up to left knee, attributes to neuropathy, no change in this. Has occassional  left calf pain with walking various distances, this is worsening. There are no signs of ischemia in his feet or legs.   Pt denies any history of stroke or TIA. I requested carotid duplex at his last visit for this visit, but not performed today.   His DM is in excellent control and he quit smoking in 2009.  He takes a statin and Plavix.   Prescription for [employer] given to pt for supplies for his right AKA prosthesis.   Last serum creatinine result on file was 1.03 on 04-03-17.   DATA  Carotid Duplex not performed today  Left LE Arterial Duplex (07/23/17): 75-99% stenosis in the popliteal artery (617 cm/s), distal to stent. Stent with no significant stenosis.    ABI (Date: 07/23/2017):  R: AKA  L:   ABI: 0.40 (was 0.93 on 01-22-17),   PT: waveform morphology not documented (was bi)  DP: waveform morphology not documented (was bi)   TBI: 0.15 (was 0.44)  Significant decline in left ABI.    Carotid Duplex (01/22/17): Right ICA: 60-79% stenosis. Left ICA: 40-59% stenosis (EDV of 55 today, was 77 on 07-17-16). Location of bifurcation is mid to upper hyoid notch.  Bilateral vertebral artery flow is antegrade.  Right subclavian artery waveforms are biphasic, left are triphasic.  Right ICA unchanged, left ICA stenosis seems less than on 07-17-16.   PLAN:  Based on the patient's vascular studies, HPI, and examination, and after discussing with Dr. Trula Slade, pt will be scheduled for aortogram with run off, possible left LE intervention, on 08-07-17 by Dr. Trula Slade.   Since carotid duplex not performed today, will schedule this for ASAP at pt convenience, I will call him with the results.  I discussed in depth with the patient the nature of atherosclerosis, and emphasized the importance of maximal medical management including strict control of blood pressure, blood glucose, and lipid levels, obtaining regular exercise, and continued cessation of smoking.  The patient is aware  that without maximal medical management the underlying atherosclerotic disease process will progress, limiting the benefit of  any interventions.  The patient was given information about PAD including signs, symptoms, treatment, what symptoms should prompt the patient to seek immediate medical care, and risk reduction measures to take.  Nathan Chambers, RN, MSN, FNP-C [facility] Phone: [phone]  Clinic MD: Trula Slade  07/23/17 12:02 PM

## 2017-07-23 NOTE — Patient Instructions (Signed)

## 2017-07-23 NOTE — Progress Notes (Signed)
[facility] [place]   CC: Follow up peripheral artery occlusive disease  History of Present Illness Nathan [name] is a 67 y.o. male who is s/p left SFA stenting in 2010. He is s/p right AKA in 2009.  Dr. Trula Slade performed an aortogram in 2013 to evaluate LE perfusion.  Pt is ambulating with a right above-knee prosthesis. He worked with [employer] to address the moderate pain he was having at the bony prominence of the right AKA stump with walking, and had resolution of this pain.  He has stinging and burning in foot up to left knee, attributes to neuropathy.  Has occassional left calf pain with walking various distances which is worsening, denies non healing wounds.  Pt denies any history of stroke or TIA.  He was evaluated by K. Trinh, PA-C, on 07-17-16. At that time left SFA stent was patent and ABIs stable. He continued to have some neuropathic type symptoms in the left leg and foot. Ambulating well with right leg prosthesis. Pt advised to continue maximal medical management with diabetes control, plavix and statin. Follow up in one year with repeat ABIs and left leg arterial duplex.  Carotid duplex demonstrated 60-79% bilateral carotid stenosis, he had been asymptomatic. His intermittent bilateral eye blurriness resolved with blinking and had been unchanged for the prior several years. Did not appear to be amaurosis fugax. Did discuss with Dr. Trula Slade that he is below the 80% threshold for surgical intervention. Pt was to follow up in 6 months with repeat carotid duplex. The patient knows to seek emergency assistance if he develops any TIA or stroke symptoms.   He feels that the pain in his left calf is worsening with walking, pain develops at varying walking distances.   Pt Diabetic: Yes, 5.6 A1C on 01-25-17,  improved from over 8 per pt. Pt smoker: former smoker, quit in 2009  Pt meds include: Statin :Yes ASA: No Other anticoagulants/antiplatelets: Plavix     Past Medical History:  Diagnosis Date  . Asthma    per 2003 [facility] pulm records pfts   . Blood dyscrasia    HIV  . Clotting disorder ([place])   . Colon polyps    noted previous colonoscopy [facility]  . DDD (degenerative disc disease)    cervical spine  . Depression   . Diabetes mellitus    dx 2010  . Fever    [place] origin  . GERD (gastroesophageal reflux disease)   . Head swelling 05/23/2017  . Hep C w/o coma, chronic ([place])   . History of syphilis    noted [facility] records  . HIV infection ([place])    undetectable viral load and CD4 ct 667 as of 11/2011  . Hyperlipidemia   . Hypertension   . MRSA (methicillin resistant Staphylococcus aureus)   . Multiple sclerosis ([place])    in remission as of 11/2011 (diagnosed late 1980s)  . Pain in limb-Right Leg 10/13/2013  . 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[place])    2002  . Pneumonia   . PVD (peripheral vascular disease) ([place])    Left Stent 07/02/2008  . UTI (urinary tract infection)     Social History Social History   Tobacco Use  . Smoking status: Former Smoker    Last attempt to quit: 05/09/2007    Years since quitting: 10.2  . Smokeless tobacco: Never Used  Substance Use Topics  . Alcohol use: No    Alcohol/week: 0.0 oz  . Drug use: No    Family  History Family History  Problem Relation Age of Onset  . Diabetes Mother   . Coronary artery disease Mother   . Cancer Brother        colon caner stage 4 as of 11/2011 (unknown age of onset)  . Coronary artery disease Father   . Prostate cancer Brother     Past Surgical History:  Procedure Laterality Date  . ABOVE KNEE LEG AMPUTATION     Right Leg  . LOWER EXTREMITY ANGIOGRAM Left 12/26/2011   Procedure: LOWER EXTREMITY ANGIOGRAM;  Surgeon: Serafina Mitchell, MD;  Location: [facility] CATH LAB;  Service: Cardiovascular;  Laterality: Left;  . OTHER SURGICAL HISTORY     right lower ext AKA with prothesis   . OTHER SURGICAL HISTORY     left left with stents (Dr. Seward Speck)  . OTHER  SURGICAL HISTORY     2003 colonoscopy 5 mm polyp transverse colon; (2)32m  polyps in rectum-hyperplastic    Allergies  Allergen Reactions  . Sulfonamide Derivatives Hives    Current Outpatient Medications  Medication Sig Dispense Refill  . atorvastatin (LIPITOR) 40 MG tablet Take 1 tablet (40 mg total) daily by mouth. 90 tablet 1  . bictegravir-emtricitabine-tenofovir AF (BIKTARVY) 50-200-25 MG TABS tablet Take 1 tablet by mouth daily. 30 tablet 11  . Blood Glucose Monitoring Suppl (BAYER CONTOUR MONITOR) W/DEVICE KIT Use to check blood sugar as instructed up to 4 times a day 1 kit 0  . clopidogrel (PLAVIX) 75 MG tablet TAKE 1 TABLET BY MOUTH DAILY 90 tablet 5  . CONTOUR NEXT TEST test strip TEST BLOOD SUGAR FOUR TIMES DAILY BEFORE MEALS AND AT BEDTIME 100 each 5  . ibuprofen (ADVIL,MOTRIN) 200 MG tablet Take 400-600 mg by mouth every 6 (six) hours as needed.    . Insulin Glargine (LANTUS SOLOSTAR) 100 UNIT/ML Solostar Pen Inject 80 Units into the skin daily at 10 pm. Diagnosis code  E11.42 15 mL 11  . insulin lispro (HUMALOG KWIKPEN) 100 UNIT/ML KiwkPen Inject 0.26 mLs (26 Units total) into the skin 3 (three) times daily with meals. 30 mL 11  . Insulin Pen Needle (BD PEN NEEDLE NANO U/F) 32G X 4 MM MISC USE AS DIRECTED FOUR TIMES DAILY. Dx code  E11.42 100 each 5  . Lancet Devices (BAYER MICROLET 2 LANCING DEVIC) MISC Use to check blood sugar up to 3 ties a day 1 each 2  . lisinopril (PRINIVIL,ZESTRIL) 40 MG tablet TAKE 1 TABLET BY MOUTH DAILY 90 tablet 1  . omeprazole (PRILOSEC) 40 MG capsule TAKE ONE CAPSULE BY MOUTH EVERY DAY 90 capsule 1  . pioglitazone (ACTOS) 30 MG tablet TAKE 1 TABLET BY MOUTH EVERY DAY 90 tablet 1  . VICTOZA 18 MG/3ML SOPN INJECT 1.8MG UNDER THE SKIN EVERY MORNING 9 mL 1  . zidovudine (RETROVIR) 300 MG tablet TAKE 1 TABLET(300 MG) BY MOUTH TWICE DAILY 60 tablet 8  . Lancets MISC Use to Test Blood Sugar Three Times Daily. Dx Code: 250.02 100 each 3   No current  facility-administered medications for this visit.     ROS: See HPI for pertinent positives and negatives.   Physical Examination  Vitals:   07/23/17 1149 07/23/17 1151  BP: 128/72 130/75  Pulse: 70   Resp: 18   Temp: (!) 97.1 F (36.2 C)   TempSrc: Oral   SpO2: 95%   Weight: 182 lb 11.2 oz (82.9 kg)   Height: _0  (1.854 m)    Body mass index is 24.1 kg/m.  General: A&O x 3, WDWN male. Gait: using right AKA prosthesis, steady Eyes: PERRLA. HENT: No gross abnormalities  Pulmonary: Respirations are non labored, CTAB, without wheezes, rales, or rhonchi. Cardiac: regular rythm, no detected murmur.   Carotid Bruits Left Right   Negative negative   Abdominal aortic pulse is not palpable. Radial pulses: are 2+ palpable and =   VASCULAR EXAM: Extremitieswithoutischemic changes  withoutGangrene; withoutopen wounds. All left toes are pink and warm with brisk capillary refill.  Right AKA prosthesis in place.   LE Pulses LEFT RIGHT  FEMORAL 2+ palpable not palpable   POPLITEAL not palpable  AKA  POSTERIOR TIBIAL not palpable  AKA   DORSALIS PEDIS ANTERIOR TIBIAL not palpable  AKA    Abdomen: soft, NT, normal bowel sounds, no palpated masses. Skin: no rashes, no cellulitis, no ulcers noted. Musculoskeletal: no muscle wasting or atrophy.  Neurologic: A&O X 3; appropriate affect, Sensation is normal; MOTOR FUNCTION:  moving all extremities equally, motor strength 5/5 throughout. Speech is fluent/normal. CN 2-12 intact. Psychiatric: Thought content is normal, mood appropriate for clinical situation.     ASSESSMENT: EGE MUCKEY is a 67 y.o. male who is status post s/p left SFA stenting in 2010. He is s/p right AKA in 2009.  He is ambulating with a right above-knee prosthesis. Has stinging and burning in foot up to left knee, attributes to neuropathy, no change in this. Has occassional  left calf pain with walking various distances, this is worsening. There are no signs of ischemia in his feet or legs.   Pt denies any history of stroke or TIA. I requested carotid duplex at his last visit for this visit, but not performed today.   His DM is in excellent control and he quit smoking in 2009.  He takes a statin and Plavix.   Prescription for [employer] given to pt for supplies for his right AKA prosthesis.   Last serum creatinine result on file was 1.03 on 04-03-17.   DATA  Carotid Duplex not performed today  Left LE Arterial Duplex (07/23/17): 75-99% stenosis in the popliteal artery (617 cm/s), distal to stent. Stent with no significant stenosis.    ABI (Date: 07/23/2017):  R: AKA  L:   ABI: 0.40 (was 0.93 on 01-22-17),   PT: waveform morphology not documented (was bi)  DP: waveform morphology not documented (was bi)   TBI: 0.15 (was 0.44)  Significant decline in left ABI.    Carotid Duplex (01/22/17): Right ICA: 60-79% stenosis. Left ICA: 40-59% stenosis (EDV of 55 today, was 77 on 07-17-16). Location of bifurcation is mid to upper hyoid notch.  Bilateral vertebral artery flow is antegrade.  Right subclavian artery waveforms are biphasic, left are triphasic.  Right ICA unchanged, left ICA stenosis seems less than on 07-17-16.   PLAN:  Based on the patient's vascular studies, HPI, and examination, and after discussing with Dr. Trula Slade, pt will be scheduled for aortogram with run off, possible left LE intervention, on 08-07-17 by Dr. Trula Slade.   Since carotid duplex not performed today, will schedule this for ASAP at pt convenience, I will call him with the results.  I discussed in depth with the patient the nature of atherosclerosis, and emphasized the importance of maximal medical management including strict control of blood pressure, blood glucose, and lipid levels, obtaining regular exercise, and continued cessation of smoking.  The patient is aware  that without maximal medical management the underlying atherosclerotic disease process will progress, limiting the benefit of  any interventions.  The patient was given information about PAD including signs, symptoms, treatment, what symptoms should prompt the patient to seek immediate medical care, and risk reduction measures to take.  Clemon Chambers, RN, MSN, FNP-C [facility] Phone: [phone]  Clinic MD: Trula Slade  07/23/17 12:02 PM

## 2017-07-25 ENCOUNTER — Ambulatory Visit: Payer: Self-pay | Admitting: Infectious Disease

## 2017-07-26 ENCOUNTER — Ambulatory Visit (INDEPENDENT_AMBULATORY_CARE_PROVIDER_SITE_OTHER): Payer: Medicare Other | Admitting: Internal Medicine

## 2017-07-26 ENCOUNTER — Ambulatory Visit (INDEPENDENT_AMBULATORY_CARE_PROVIDER_SITE_OTHER): Payer: Medicare Other | Admitting: Infectious Disease

## 2017-07-26 ENCOUNTER — Encounter: Payer: Self-pay | Admitting: Infectious Disease

## 2017-07-26 ENCOUNTER — Encounter: Payer: Self-pay | Admitting: Internal Medicine

## 2017-07-26 ENCOUNTER — Other Ambulatory Visit: Payer: Self-pay

## 2017-07-26 VITALS — BP 133/50 | HR 89 | Temp 98.4°F | Ht 73.0 in | Wt 185.4 lb

## 2017-07-26 VITALS — BP 131/76 | HR 86 | Temp 97.8°F | Wt 183.0 lb

## 2017-07-26 DIAGNOSIS — I779 Disorder of arteries and arterioles, unspecified: Secondary | ICD-10-CM

## 2017-07-26 DIAGNOSIS — L989 Disorder of the skin and subcutaneous tissue, unspecified: Secondary | ICD-10-CM

## 2017-07-26 DIAGNOSIS — B20 Human immunodeficiency virus [HIV] disease: Secondary | ICD-10-CM | POA: Diagnosis present

## 2017-07-26 DIAGNOSIS — Z79899 Other long term (current) drug therapy: Secondary | ICD-10-CM

## 2017-07-26 DIAGNOSIS — Z794 Long term (current) use of insulin: Secondary | ICD-10-CM

## 2017-07-26 DIAGNOSIS — I739 Peripheral vascular disease, unspecified: Secondary | ICD-10-CM

## 2017-07-26 DIAGNOSIS — I70213 Atherosclerosis of native arteries of extremities with intermittent claudication, bilateral legs: Secondary | ICD-10-CM

## 2017-07-26 DIAGNOSIS — Z89611 Acquired absence of right leg above knee: Secondary | ICD-10-CM

## 2017-07-26 DIAGNOSIS — Z7902 Long term (current) use of antithrombotics/antiplatelets: Secondary | ICD-10-CM | POA: Diagnosis not present

## 2017-07-26 DIAGNOSIS — E1142 Type 2 diabetes mellitus with diabetic polyneuropathy: Secondary | ICD-10-CM

## 2017-07-26 DIAGNOSIS — Z113 Encounter for screening for infections with a predominantly sexual mode of transmission: Secondary | ICD-10-CM | POA: Diagnosis not present

## 2017-07-26 DIAGNOSIS — I10 Essential (primary) hypertension: Secondary | ICD-10-CM

## 2017-07-26 DIAGNOSIS — E1151 Type 2 diabetes mellitus with diabetic peripheral angiopathy without gangrene: Secondary | ICD-10-CM

## 2017-07-26 DIAGNOSIS — Z87891 Personal history of nicotine dependence: Secondary | ICD-10-CM | POA: Diagnosis not present

## 2017-07-26 HISTORY — DX: Disorder of the skin and subcutaneous tissue, unspecified: L98.9

## 2017-07-26 LAB — GLUCOSE, CAPILLARY: GLUCOSE-CAPILLARY: 187 mg/dL — AB (ref 65–99)

## 2017-07-26 LAB — POCT GLYCOSYLATED HEMOGLOBIN (HGB A1C): Hemoglobin A1C: 6

## 2017-07-26 MED ORDER — INSULIN GLARGINE 100 UNIT/ML SOLOSTAR PEN: 70.0000 [IU] | PEN_INJECTOR | Freq: Every day | SUBCUTANEOUS | 11 refills | Status: DC

## 2017-07-26 NOTE — Progress Notes (Signed)
Chief complaint: followup for HIV on meds,   Subjective:    Patient ID: Nathan [name], male    DOB: [birth year], 67 y.o.   MRN: [id number]  HPI   Nathan [name] is a 67 y.o. male whose HIV has been perfectly suppressed with salvage regimen of  twice daily combivir, isentress and once daily viread with undetectable viral load and health cd4 count. (he has EXTENSIVE R with R to all NNRTI, R to nearly all PI except DRV, with still activity from TNF, AZT and the isentress, changed to BID AZT, ISENTRESS, and daily Descovy, now changed to BIKTARVY once daily and twice daily AZT.  Lab Results  Component Value Date   HIV1RNAQUANT <20 NOT DETECTED 05/10/2017   HIV1RNAQUANT 56 (H) 04/03/2017   HIV1RNAQUANT <20 01/05/2016    Lab Results  Component Value Date   CD4TABS 700 05/10/2017   CD4TABS 990 04/03/2017   CD4TABS 940 01/05/2016   We ran [employer] which is shown below:         Past Medical History:  Diagnosis Date  . Asthma    per 2003 [facility] pulm records pfts   . Blood dyscrasia    HIV  . Clotting disorder ([place])   . Colon polyps    noted previous colonoscopy [facility]  . DDD (degenerative disc disease)    cervical spine  . Depression   . Diabetes mellitus    dx 2010  . Fever    unknwon origin  . GERD (gastroesophageal reflux disease)   . Head swelling 05/23/2017  . Hep C w/o coma, chronic ([place])   . History of syphilis    noted [facility] records  . HIV infection ([place])    undetectable viral load and CD4 ct 667 as of 11/2011  . Hyperlipidemia   . Hypertension   . MRSA (methicillin resistant Staphylococcus aureus)   . Multiple sclerosis ([place])    in remission as of 11/2011 (diagnosed late 1980s)  . Pain in limb-Right Leg 10/13/2013  . 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[place])    2002  . Pneumonia   . PVD (peripheral vascular disease) ([place])    Left Stent 07/02/2008  . UTI (urinary tract infection)     Past Surgical History:  Procedure Laterality Date  . ABOVE  KNEE LEG AMPUTATION     Right Leg  . LOWER EXTREMITY ANGIOGRAM Left 12/26/2011   Procedure: LOWER EXTREMITY ANGIOGRAM;  Surgeon: Serafina Mitchell, MD;  Location: [facility] CATH LAB;  Service: Cardiovascular;  Laterality: Left;  . OTHER SURGICAL HISTORY     right lower ext AKA with prothesis   . OTHER SURGICAL HISTORY     left left with stents (Dr. Seward Speck)  . OTHER SURGICAL HISTORY     2003 colonoscopy 5 mm polyp transverse colon; (2)62m  polyps in rectum-hyperplastic    Family History  Problem Relation Age of Onset  . Diabetes Mother   . Coronary artery disease Mother   . Cancer Brother        colon caner stage 4 as of 11/2011 (unknown age of onset)  . Coronary artery disease Father   . Prostate cancer Brother       Social History   Socioeconomic History  . Marital status: Single    Spouse name: Not on file  . Number of children: Not on file  . Years of education: 149 . Highest education level: Not on file  Occupational History  . Not on file  Social Needs  .  Financial resource strain: Not on file  . Food insecurity:    Worry: Not on file    Inability: Not on file  . Transportation needs:    Medical: Not on file    Non-medical: Not on file  Tobacco Use  . Smoking status: Former Smoker    Last attempt to quit: 05/09/2007    Years since quitting: 10.2  . Smokeless tobacco: Never Used  Substance and Sexual Activity  . Alcohol use: No    Alcohol/week: 0.0 oz  . Drug use: No  . Sexual activity: Yes    Partners: Male  Lifestyle  . Physical activity:    Days per week: Not on file    Minutes per session: Not on file  . Stress: Not on file  Relationships  . Social connections:    Talks on phone: Not on file    Gets together: Not on file    Attends religious service: Not on file    Active member of club or organization: Not on file    Attends meetings of clubs or organizations: Not on file    Relationship status: Not on file  Other Topics Concern  . Not on file    Social History Narrative  . Not on file    Allergies  Allergen Reactions  . Sulfonamide Derivatives Hives     Current Outpatient Medications:  .  atorvastatin (LIPITOR) 40 MG tablet, Take 1 tablet (40 mg total) daily by mouth., Disp: 90 tablet, Rfl: 1 .  bictegravir-emtricitabine-tenofovir AF (BIKTARVY) 50-200-25 MG TABS tablet, Take 1 tablet by mouth daily., Disp: 30 tablet, Rfl: 11 .  Blood Glucose Monitoring Suppl (BAYER CONTOUR MONITOR) W/DEVICE KIT, Use to check blood sugar as instructed up to 4 times a day, Disp: 1 kit, Rfl: 0 .  clopidogrel (PLAVIX) 75 MG tablet, TAKE 1 TABLET BY MOUTH DAILY, Disp: 90 tablet, Rfl: 5 .  CONTOUR NEXT TEST test strip, TEST BLOOD SUGAR FOUR TIMES DAILY BEFORE MEALS AND AT BEDTIME, Disp: 100 each, Rfl: 5 .  ibuprofen (ADVIL,MOTRIN) 200 MG tablet, Take 400-600 mg by mouth every 6 (six) hours as needed., Disp: , Rfl:  .  Insulin Glargine (LANTUS SOLOSTAR) 100 UNIT/ML Solostar Pen, Inject 80 Units into the skin daily at 10 pm. Diagnosis code  E11.42, Disp: 15 mL, Rfl: 11 .  insulin lispro (HUMALOG KWIKPEN) 100 UNIT/ML KiwkPen, Inject 0.26 mLs (26 Units total) into the skin 3 (three) times daily with meals., Disp: 30 mL, Rfl: 11 .  Insulin Pen Needle (BD PEN NEEDLE NANO U/F) 32G X 4 MM MISC, USE AS DIRECTED FOUR TIMES DAILY. Dx code  E11.42, Disp: 100 each, Rfl: 5 .  Lancet Devices (BAYER MICROLET 2 LANCING DEVIC) MISC, Use to check blood sugar up to 3 ties a day, Disp: 1 each, Rfl: 2 .  Lancets MISC, Use to Test Blood Sugar Three Times Daily. Dx Code: 250.02, Disp: 100 each, Rfl: 3 .  lisinopril (PRINIVIL,ZESTRIL) 40 MG tablet, TAKE 1 TABLET BY MOUTH DAILY, Disp: 90 tablet, Rfl: 1 .  omeprazole (PRILOSEC) 40 MG capsule, TAKE ONE CAPSULE BY MOUTH EVERY DAY, Disp: 90 capsule, Rfl: 1 .  pioglitazone (ACTOS) 30 MG tablet, TAKE 1 TABLET BY MOUTH EVERY DAY, Disp: 90 tablet, Rfl: 1 .  VICTOZA 18 MG/3ML SOPN, INJECT 1.8MG UNDER THE SKIN EVERY MORNING, Disp: 9 mL,  Rfl: 1 .  zidovudine (RETROVIR) 300 MG tablet, TAKE 1 TABLET(300 MG) BY MOUTH TWICE DAILY, Disp: 60 tablet, Rfl: 8  Review of Systems  Constitutional: Negative for activity change, appetite change, chills, diaphoresis and unexpected weight change.  HENT: Negative for sinus pressure, sneezing and trouble swallowing.   Eyes: Negative for photophobia and visual disturbance.  Respiratory: Negative for chest tightness and stridor.   Cardiovascular: Negative for palpitations and leg swelling.  Gastrointestinal: Negative for abdominal distention, anal bleeding, blood in stool and constipation.  Genitourinary: Negative for difficulty urinating, dysuria, flank pain and hematuria.  Musculoskeletal: Positive for arthralgias and myalgias. Negative for back pain, gait problem and joint swelling.  Skin: Negative for color change, pallor and wound.  Neurological: Negative for dizziness, tremors, weakness and light-headedness.  Hematological: Negative for adenopathy. Does not bruise/bleed easily.  Psychiatric/Behavioral: Negative for agitation, behavioral problems, confusion, decreased concentration, dysphoric mood and sleep disturbance.       Objective:   Physical Exam  Constitutional: He is oriented to person, place, and time. He appears well-developed and well-nourished. No distress.  HENT:  Head: Atraumatic.  Mouth/Throat: Oropharynx is clear and moist. No oropharyngeal exudate.  Eyes: Conjunctivae and EOM are normal. No scleral icterus.  Neck: Normal range of motion. Neck supple.  Cardiovascular: Normal rate and regular rhythm.  Pulmonary/Chest: Effort normal. No respiratory distress. He has no wheezes.  Abdominal: He exhibits no distension.  Musculoskeletal: He exhibits no edema or tenderness.  Neurological: He is alert and oriented to person, place, and time.  Skin: Skin is warm and dry. He is not diaphoretic. No erythema. No pallor.  Psychiatric: He has a normal mood and affect. His behavior  is normal. Judgment and thought content normal.  Nursing note and vitals reviewed.     Assessment & Plan:    HIV: highly R virus: He does actually have ONE DRAM so if we went to [facility] ever it would technically DRV  BID. Minh and I discussed extensively here. We debated between keeping THREE full drugs regimen (current regimen) vs more avant garde regimen of 2 fully drugs BIKTARVY alone.  We will keep with current regimen and see him back in 6 months  Disseminated syphilis with hepatitis: NON-REACTIVE (11/27 1608) resolved  Scalp absnomality: never had CT scan and we did not address today as he was needing to go to his IM appt.  DM: followed closely in IM clinic  PVD  Having angiogram soon  HTN: followed by PCP  Vitals:   07/26/17 1402  BP: 131/76  Pulse: 86  Temp: 97.8 F (36.6 C)

## 2017-07-26 NOTE — Patient Instructions (Signed)
Thank you for coming to see me today. It was a pleasure. Today we talked about:   Diabetes: COntinue your current medications but decrease your Lantus to 70 units daily.  I have also put in an order for your diabetic shoes.  Good luck with your procedure APril 2 !  Please follow-up with me  in 3 months  If you have any questions or concerns, please do not hesitate to call the office at [phone].  Take Care,   Jule Ser, DO

## 2017-07-26 NOTE — Progress Notes (Signed)
HIV Genotype Composite Data Genotype Dates:   Mutations in Bold impact drug susceptibility RT Mutations K103KN, V179VDIN, Y188YFHL, M184MV  PI Mutations I54IV, I84IV,  L10LI, L19I, E35D, M36MI, N37NS, R41K, A71AV, V77VI  Integrase Mutations None   Interpretation of Genotype Data per [employer] HIV Database Nucleoside RTIs  abacavir (ABC) Low-Level Resistance zidovudine (AZT) Susceptible emtricitabine (FTC) High-Level Resistance lamivudine (3TC) High-Level Resistance tenofovir (TDF) Susceptible   Non-Nucleoside RTIs  doravirine (DOR) High-Level Resistance efavirenz (EFV) High-Level Resistance etravirine (ETR) Low-Level Resistance nevirapine (NVP) High-Level Resistance rilpivirine (RPV) High-Level Resistance   Protease Inhibitors  atazanavir/r (ATV/r) High-Level Resistance darunavir/r (DRV/r) Low-Level Resistance lopinavir/r (LPV/r) Intermediate Resistance   Integrase Inhibitors  doravirine (DOR) High-Level Resistance efavirenz (EFV) High-Level Resistance etravirine (ETR) Low-Level Resistance nevirapine (NVP) High-Level Resistance rilpivirine (RPV) High-Level Resistance

## 2017-07-26 NOTE — Progress Notes (Signed)
CC: here for f/u DM and PVD  HPI:  [name] is a 67 y.o. man with a past medical history listed below here today for follow up of his DM and PVD.   For details of today's visit and the status of his chronic medical issues please refer to the assessment and plan.   Past Medical History:  Diagnosis Date  . Asthma    per 2003 [facility] pulm records pfts   . Blood dyscrasia    HIV  . Clotting disorder ([place])   . Colon polyps    noted previous colonoscopy [facility]  . DDD (degenerative disc disease)    cervical spine  . Depression   . Diabetes mellitus    dx 2010  . Fever    unknwon origin  . GERD (gastroesophageal reflux disease)   . Head swelling 05/23/2017  . Hep C w/o coma, chronic ([place])   . History of syphilis    noted [facility] records  . HIV infection ([place])    undetectable viral load and CD4 ct 667 as of 11/2011  . Hyperlipidemia   . Hypertension   . MRSA (methicillin resistant Staphylococcus aureus)   . Multiple sclerosis ([place])    in remission as of 11/2011 (diagnosed late 1980s)  . Pain in limb-Right Leg 10/13/2013  . 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[place])    2002  . Pneumonia   . PVD (peripheral vascular disease) ([place])    Left Stent 07/02/2008  . Scalp lesion 07/26/2017  . UTI (urinary tract infection)    Review of Systems:  Please see pertinent ROS reviewed in HPI and problem based charting.   Physical Exam:  Vitals:   07/26/17 1500  BP: (!) 133/50  Pulse: 89  Temp: 98.4 F (36.9 C)  TempSrc: Oral  SpO2: 100%  Weight: 185 lb 6.4 oz (84.1 kg)  Height: 6\' 1"  (1.854 m)   Physical Exam  Constitutional: He is oriented to person, place, and time and well-developed, well-nourished, and in no distress.  HENT:  Head: Normocephalic and atraumatic.  Cardiovascular: Normal rate and regular rhythm.  Pulmonary/Chest: Effort normal and breath sounds normal.  Musculoskeletal: He exhibits no edema.  Right AKA  Neurological: He is alert and oriented to person,  place, and time.  Skin: Skin is warm and dry.  Psychiatric: Mood and affect normal.     Assessment & Plan:   See Encounters Tab for problem based charting.  Patient discussed with Dr. Dareen Piano .  PERIPHERAL VASCULAR DISEASE He follows with [facility] and is s/p right AKA.  He has been experiencing claudication symptoms in his left leg recently and followed up with [facility].  ABI's had deteriorated in this leg and he is now scheduled to undergo angiogram on April 2.  Plan: - Continued follow up with [facility].  Angiogram scheduled for April 2.   DM type 2 with diabetic peripheral neuropathy (HCC) A1c today is stable at 6.0 today after we decreased his insulin dosing in September 2018.  His morning sugars are running 120-130.  He will occasionally hold his Humalog dose if his sugar is less than 100 and he sometimes misses his midday dose of Humalog.   Plan: - Decrease Lantus to 70 units daily - Continue Humalog 26 units BID to TID - Continue liraglutide 1.8mg  QDay - Continue pioglitazone 30mg  Qday - RTC 3 months.   Benign essential HTN   BP Readings from Last 3 Encounters:  07/26/17 (!) 133/50  07/26/17 131/76  07/23/17 130/75  BP today is well controlled on monotherapy with lisinopril 40mg  daily.  Plan: - Continue lisinopril 40mg  daily - Creatinine and electrolytes were stable in Nov 2018.  No need to recheck today given no changes to meds or doses.

## 2017-07-27 ENCOUNTER — Encounter: Payer: Self-pay | Admitting: Internal Medicine

## 2017-07-27 NOTE — Assessment & Plan Note (Signed)
A1c today is stable at 6.0 today after we decreased his insulin dosing in September 2018.  His morning sugars are running 120-130.  He will occasionally hold his Humalog dose if his sugar is less than 100 and he sometimes misses his midday dose of Humalog.   Plan: - Decrease Lantus to 70 units daily - Continue Humalog 26 units BID to TID - Continue liraglutide 1.8mg  QDay - Continue pioglitazone 30mg  Qday - RTC 3 months.

## 2017-07-27 NOTE — Assessment & Plan Note (Signed)
  BP Readings from Last 3 Encounters:  07/26/17 (!) 133/50  07/26/17 131/76  07/23/17 130/75   BP today is well controlled on monotherapy with lisinopril 40mg  daily.  Plan: - Continue lisinopril 40mg  daily - Creatinine and electrolytes were stable in Nov 2018.  No need to recheck today given no changes to meds or doses.

## 2017-07-27 NOTE — Assessment & Plan Note (Signed)
He follows with [facility] and is s/p right AKA.  He has been experiencing claudication symptoms in his left leg recently and followed up with [facility].  ABI's had deteriorated in this leg and he is now scheduled to undergo angiogram on April 2.  Plan: - Continued follow up with [facility].  Angiogram scheduled for April 2.

## 2017-07-27 NOTE — Progress Notes (Signed)
Internal Medicine Clinic Attending  Case discussed with Dr. Wallace at the time of the visit.  We reviewed the resident's history and exam and pertinent patient test results.  I agree with the assessment, diagnosis, and plan of care documented in the resident's note.  

## 2017-07-31 ENCOUNTER — Encounter (HOSPITAL_COMMUNITY): Payer: Self-pay

## 2017-08-07 ENCOUNTER — Ambulatory Visit (HOSPITAL_COMMUNITY)
Admission: RE | Disposition: A | Discharge status: Home / Self Care | Payer: Self-pay | Source: Ambulatory Visit | Attending: Surgery

## 2017-08-07 ENCOUNTER — Ambulatory Visit (HOSPITAL_COMMUNITY)
Admission: RE | Admit: 2017-08-07 | Discharge: 2017-08-07 | Disposition: A | Discharge status: Home / Self Care | Payer: Medicare Other | Source: Ambulatory Visit | Attending: Surgery | Admitting: Surgery

## 2017-08-07 ENCOUNTER — Encounter (HOSPITAL_COMMUNITY): Payer: Self-pay | Admitting: Surgery

## 2017-08-07 DIAGNOSIS — Z87891 Personal history of nicotine dependence: Secondary | ICD-10-CM | POA: Diagnosis not present

## 2017-08-07 DIAGNOSIS — E1151 Type 2 diabetes mellitus with diabetic peripheral angiopathy without gangrene: Secondary | ICD-10-CM | POA: Insufficient documentation

## 2017-08-07 DIAGNOSIS — Z89611 Acquired absence of right leg above knee: Secondary | ICD-10-CM | POA: Diagnosis not present

## 2017-08-07 DIAGNOSIS — Z8619 Personal history of other infectious and parasitic diseases: Secondary | ICD-10-CM | POA: Insufficient documentation

## 2017-08-07 DIAGNOSIS — B20 Human immunodeficiency virus [HIV] disease: Secondary | ICD-10-CM | POA: Insufficient documentation

## 2017-08-07 DIAGNOSIS — B182 Chronic viral hepatitis C: Secondary | ICD-10-CM | POA: Insufficient documentation

## 2017-08-07 DIAGNOSIS — Z955 Presence of coronary angioplasty implant and graft: Secondary | ICD-10-CM | POA: Diagnosis not present

## 2017-08-07 DIAGNOSIS — Z9889 Other specified postprocedural states: Secondary | ICD-10-CM | POA: Diagnosis not present

## 2017-08-07 DIAGNOSIS — Z86010 Personal history of colonic polyps: Secondary | ICD-10-CM | POA: Insufficient documentation

## 2017-08-07 DIAGNOSIS — Z7902 Long term (current) use of antithrombotics/antiplatelets: Secondary | ICD-10-CM | POA: Insufficient documentation

## 2017-08-07 DIAGNOSIS — Z79899 Other long term (current) drug therapy: Secondary | ICD-10-CM | POA: Insufficient documentation

## 2017-08-07 DIAGNOSIS — K219 Gastro-esophageal reflux disease without esophagitis: Secondary | ICD-10-CM | POA: Diagnosis not present

## 2017-08-07 DIAGNOSIS — Z833 Family history of diabetes mellitus: Secondary | ICD-10-CM | POA: Insufficient documentation

## 2017-08-07 DIAGNOSIS — Z8614 Personal history of Methicillin resistant Staphylococcus aureus infection: Secondary | ICD-10-CM | POA: Insufficient documentation

## 2017-08-07 DIAGNOSIS — E785 Hyperlipidemia, unspecified: Secondary | ICD-10-CM | POA: Diagnosis not present

## 2017-08-07 DIAGNOSIS — I10 Essential (primary) hypertension: Secondary | ICD-10-CM | POA: Insufficient documentation

## 2017-08-07 DIAGNOSIS — G35 Multiple sclerosis: Secondary | ICD-10-CM | POA: Diagnosis not present

## 2017-08-07 DIAGNOSIS — Z794 Long term (current) use of insulin: Secondary | ICD-10-CM | POA: Insufficient documentation

## 2017-08-07 DIAGNOSIS — Z95820 Peripheral vascular angioplasty status with implants and grafts: Secondary | ICD-10-CM | POA: Diagnosis not present

## 2017-08-07 DIAGNOSIS — I70212 Atherosclerosis of native arteries of extremities with intermittent claudication, left leg: Secondary | ICD-10-CM | POA: Diagnosis not present

## 2017-08-07 DIAGNOSIS — Z8249 Family history of ischemic heart disease and other diseases of the circulatory system: Secondary | ICD-10-CM | POA: Diagnosis not present

## 2017-08-07 DIAGNOSIS — Z87440 Personal history of urinary (tract) infections: Secondary | ICD-10-CM | POA: Insufficient documentation

## 2017-08-07 DIAGNOSIS — Z882 Allergy status to sulfonamides status: Secondary | ICD-10-CM | POA: Insufficient documentation

## 2017-08-07 HISTORY — PX: ABDOMINAL AORTOGRAM W/LOWER EXTREMITY: CATH118223

## 2017-08-07 LAB — POCT I-STAT, CHEM 8
BUN: 12 mg/dL (ref 6–20)
CHLORIDE: 102 mmol/L (ref 101–111)
Calcium, Ion: 1.27 mmol/L (ref 1.15–1.40)
Creatinine, Ser: 0.9 mg/dL (ref 0.61–1.24)
GLUCOSE: 119 mg/dL — AB (ref 65–99)
HCT: 39 % (ref 39.0–52.0)
Hemoglobin: 13.3 g/dL (ref 13.0–17.0)
POTASSIUM: 3.9 mmol/L (ref 3.5–5.1)
SODIUM: 142 mmol/L (ref 135–145)
TCO2: 28 mmol/L (ref 22–32)

## 2017-08-07 LAB — GLUCOSE, CAPILLARY: Glucose-Capillary: 119 mg/dL — ABNORMAL HIGH (ref 65–99)

## 2017-08-07 SURGERY — ABDOMINAL AORTOGRAM W/LOWER EXTREMITY
Anesthesia: LOCAL

## 2017-08-07 MED ORDER — MIDAZOLAM HCL 2 MG/2ML IJ SOLN
INTRAMUSCULAR | Status: DC | PRN
  Administered 2017-08-07: 2 mg via INTRAVENOUS

## 2017-08-07 MED ORDER — FENTANYL CITRATE (PF) 100 MCG/2ML IJ SOLN
INTRAMUSCULAR | Status: AC
  Filled 2017-08-07: qty 2

## 2017-08-07 MED ORDER — SODIUM CHLORIDE 0.9 % IV SOLN
INTRAVENOUS | Status: DC
  Administered 2017-08-07: 06:00:00 via INTRAVENOUS

## 2017-08-07 MED ORDER — HYDRALAZINE HCL 20 MG/ML IJ SOLN: 5.0000 mg | INTRAMUSCULAR | Status: DC | PRN

## 2017-08-07 MED ORDER — SODIUM CHLORIDE 0.9 % IV SOLN
250.0000 mL | INTRAVENOUS | Status: DC | PRN
Start: 2017-08-07 — End: 2017-08-07

## 2017-08-07 MED ORDER — SODIUM CHLORIDE 0.9% FLUSH: 3.0000 mL | Freq: Two times a day (BID) | INTRAVENOUS | Status: DC

## 2017-08-07 MED ORDER — MIDAZOLAM HCL 2 MG/2ML IJ SOLN
INTRAMUSCULAR | Status: AC
  Filled 2017-08-07: qty 2

## 2017-08-07 MED ORDER — HEPARIN (PORCINE) IN NACL 2-0.9 UNIT/ML-% IJ SOLN
INTRAMUSCULAR | Status: AC
  Filled 2017-08-07: qty 1000

## 2017-08-07 MED ORDER — LIDOCAINE HCL (PF) 1 % IJ SOLN
INTRAMUSCULAR | Status: DC | PRN
  Administered 2017-08-07: 20 mL

## 2017-08-07 MED ORDER — IODIXANOL 320 MG/ML IV SOLN
INTRAVENOUS | Status: DC | PRN
  Administered 2017-08-07: 100 mL via INTRAVENOUS

## 2017-08-07 MED ORDER — SODIUM CHLORIDE 0.9% FLUSH: 3.0000 mL | INTRAVENOUS | Status: DC | PRN

## 2017-08-07 MED ORDER — HEPARIN (PORCINE) IN NACL 2-0.9 UNIT/ML-% IJ SOLN
INTRAMUSCULAR | Status: AC | PRN
  Administered 2017-08-07: 1000 mL via INTRA_ARTERIAL

## 2017-08-07 MED ORDER — ONDANSETRON HCL 4 MG/2ML IJ SOLN: 4.0000 mg | Freq: Four times a day (QID) | INTRAMUSCULAR | Status: DC | PRN

## 2017-08-07 MED ORDER — LABETALOL HCL 5 MG/ML IV SOLN: 10.0000 mg | INTRAVENOUS | Status: DC | PRN

## 2017-08-07 MED ORDER — SODIUM CHLORIDE 0.9 % WEIGHT BASED INFUSION: 1.0000 mL/kg/h | INTRAVENOUS | Status: DC

## 2017-08-07 MED ORDER — ACETAMINOPHEN 325 MG PO TABS: 650.0000 mg | ORAL_TABLET | ORAL | Status: DC | PRN

## 2017-08-07 MED ORDER — MORPHINE SULFATE (PF) 10 MG/ML IV SOLN: 2.0000 mg | INTRAVENOUS | Status: DC | PRN

## 2017-08-07 MED ORDER — LIDOCAINE HCL 1 % IJ SOLN
INTRAMUSCULAR | Status: AC
  Filled 2017-08-07: qty 20

## 2017-08-07 MED ORDER — FENTANYL CITRATE (PF) 100 MCG/2ML IJ SOLN
INTRAMUSCULAR | Status: DC | PRN
  Administered 2017-08-07: 50 ug via INTRAVENOUS

## 2017-08-07 SURGICAL SUPPLY — 11 items
CATH OMNI FLUSH 5F 65CM (CATHETERS) ×1 IMPLANT
COVER PRB 48X5XTLSCP FOLD TPE (BAG) IMPLANT
COVER PROBE 5X48 (BAG) ×2
DRAPE ZERO GRAVITY STERILE (DRAPES) ×1 IMPLANT
KIT MICROPUNCTURE NIT STIFF (SHEATH) ×1 IMPLANT
KIT PV (KITS) ×2 IMPLANT
SHEATH AVANTI 11CM 5FR (SHEATH) ×1 IMPLANT
SYR MEDRAD MARK V 150ML (SYRINGE) ×2 IMPLANT
TRANSDUCER W/STOPCOCK (MISCELLANEOUS) ×2 IMPLANT
TRAY PV CATH (CUSTOM PROCEDURE TRAY) ×2 IMPLANT
WIRE BENTSON .035X145CM (WIRE) ×1 IMPLANT

## 2017-08-07 NOTE — Interval H&P Note (Signed)
History and Physical Interval Note:  08/07/2017 7:30 AM  Nathan [name]  has presented today for surgery, with the diagnosis of pad  The various methods of treatment have been discussed with the patient and family. After consideration of risks, benefits and other options for treatment, the patient has consented to  Procedure(s): ABDOMINAL AORTOGRAM W/LOWER EXTREMITY (N/A) as a surgical intervention .  The patient's history has been reviewed, patient examined, no change in status, stable for surgery.  I have reviewed the patient's chart and labs.  Questions were answered to the patient's satisfaction.     Annamarie Major

## 2017-08-07 NOTE — Op Note (Signed)
    Patient name: Nathan [name] MRN: [id number] DOB: Feb 15, 1951 Sex: male  08/07/2017 Pre-operative Diagnosis: Left leg claudication Post-operative diagnosis:  Same Surgeon:  Annamarie Major Procedure Performed:  1.  Failed ultrasound-guided access, right femoral artery  2.  Ultrasound-guided access, left femoral artery  3.  Abdominal aortogram  4.  Left lower extremity runoff  5.  Conscious sedation (38 minutes)    Indications: The patient has developed progressive claudication symptoms in his left leg.  He has a history of a left popliteal stent.  Ultrasound identified worsening stenosis distal to the stent he is here for further evaluation.  Procedure:  The patient was identified in the holding area and taken to room 8.  The patient was then placed supine on the table and prepped and draped in the usual sterile fashion.  A time out was called.  Conscious sedation was administered with the use of IV fentanyl and Versed under continuous physician and nurse monitoring.  Heart rate, blood pressure, and oxygen saturations were continuously monitored.  I initially tried to gain access into the right common femoral artery under ultrasound guidance.  I could get a flash of blood but could not get the wire to thread.  I was concerned that it would be occluded and therefore elected to abort this.  Ultrasound was used to evaluate the left common femoral artery.  It was patent .  A digital ultrasound image was acquired.  A micropuncture needle was used to access the left common femoral artery under ultrasound guidance.  An 018 wire was advanced without resistance and a micropuncture sheath was placed.  The 018 wire was removed and a benson wire was placed.  The micropuncture sheath was exchanged for a 5 french sheath.  An omniflush catheter was advanced over the wire to the level of L-1.  An abdominal angiogram was obtained.  Injections were performed through the sheath to evaluate the left leg  Findings:    Aortogram: The renal arteries are widely patent.  The infrarenal abdominal aorta is patent its course.  The left common and external iliac arteries are widely patent.  The right common iliac artery is patent the right external iliac artery is occluded.  Right Lower Extremity: Not evaluated  Left Lower Extremity: The left common femoral profundofemoral and superficial femoral artery are widely patent.  The stent within the above-knee popliteal artery is widely patent diffuse disease with focal stenosis greater than 90% is identified in the popliteal artery at the level of the joint space.  The anterior tibial artery is patent proximally down to the ankle.  There is reconstitution of the posterior tibial artery which is occluded proximally.  The posterior tibial artery is a dominant vessel across the ankle  Intervention: None  Impression:  #1  High-grade stenosis in the popliteal artery at the level of the joint space.  #2  Unable to gain antegrade access due to patient's anatomy.  We will consider attempting this in the operating room.     Theotis Burrow, M.D. [facility] Office: [phone] Pager:  [phone]

## 2017-08-07 NOTE — Progress Notes (Signed)
Site area: left groin fa sheath Site Prior to Removal:  Level 0 Pressure Applied For: 20 minutes Manual:   yes Patient Status During Pull:  stable Post Pull Site:  Level 0 Post Pull Instructions Given:  yes Post Pull Pulses Present: dopplered left pt Dressing Applied:  Gauze and tegaderm Bedrest begins @ 5913 Comments:

## 2017-08-07 NOTE — Discharge Instructions (Signed)

## 2017-08-08 ENCOUNTER — Other Ambulatory Visit: Payer: Self-pay

## 2017-08-08 ENCOUNTER — Other Ambulatory Visit: Payer: Self-pay | Admitting: Internal Medicine

## 2017-08-08 DIAGNOSIS — I6523 Occlusion and stenosis of bilateral carotid arteries: Secondary | ICD-10-CM

## 2017-08-09 MED FILL — Lidocaine HCl Local Inj 1%: INTRAMUSCULAR | Qty: 20 | Status: AC

## 2017-08-09 MED FILL — Heparin Sodium (Porcine) 2 Unit/ML in Sodium Chloride 0.9%: INTRAMUSCULAR | Qty: 1000 | Status: AC

## 2017-08-13 ENCOUNTER — Other Ambulatory Visit: Payer: Self-pay | Admitting: *Deleted

## 2017-08-13 ENCOUNTER — Telehealth: Payer: Self-pay | Admitting: *Deleted

## 2017-08-13 NOTE — Telephone Encounter (Signed)
Patient called back and nurse reviewed all instructions as listed in previous note. Patient verbalized understanding.

## 2017-08-13 NOTE — Telephone Encounter (Signed)
Left msg. On phone for patient to hold Plavix, be at [facility] admitting department at 11 am on 08/17/17 for procedure. NPO past MN night prior and to follow the instructions received from the pre-admission department about this surgery. Possible overnight stay. Will need driver if out patient. ASKED for patient to call me back to confirm understanding.

## 2017-08-14 NOTE — Pre-Procedure Instructions (Signed)
Nathan [name]  08/14/2017      [facility] - [place], [place] [address] Phone: [phone] Fax: [phone]  [place], [place] [address] [place] [address] [place] [address] Phone: [phone] Fax: [phone]    Your procedure is scheduled on Friday, August 17, 2017  Report to [facility] Admitting at 11:25 A.M.  Call this number if you have problems the morning of surgery:  [phone]   Remember: Follow surgeon's instructions regarding Plavix  Do not eat food or drink liquids after midnight Thursday, August 16, 2017  Take these medicines the morning of surgery with A SIP OF WATER : omeprazole (PRILOSEC) Stop taking vitamins, fish oil, and herbal medications. Do not take any NSAIDs ie: Ibuprofen, Advil, Naproxen (Aleve), Motrin, BC and Goody Powder; stop now.    How to Manage Your Diabetes Before and After Surgery  Why is it important to control my blood sugar before and after surgery? . Improving blood sugar levels before and after surgery helps healing and can limit problems. . A way of improving blood sugar control is eating a healthy diet by: o  Eating less sugar and carbohydrates o  Increasing activity/exercise o  Talking with your doctor about reaching your blood sugar goals . High blood sugars (greater than 180 mg/dL) can raise your risk of infections and slow your recovery, so you will need to focus on controlling your diabetes during the weeks before surgery. . Make sure that the doctor who takes care of your diabetes knows about your planned surgery including the date and location.  How do I manage my blood sugar before surgery? . Check your blood sugar at least 4 times a day, starting 2 days before surgery, to make sure that the level is not too high or low. o Check your blood sugar the morning of your  surgery when you wake up and every 2 hours until you get to the Short Stay unit. . If your blood sugar is less than 70 mg/dL, you will need to treat for low blood sugar: o Do not take insulin. o Treat a low blood sugar (less than 70 mg/dL) with  cup of clear juice (cranberry or apple), 4 glucose tablets, OR glucose gel. Recheck blood sugar in 15 minutes after treatment (to make sure it is greater than 70 mg/dL). If your blood sugar is not greater than 70 mg/dL on recheck, call [phone] o  for further instructions. . Report your blood sugar to the short stay nurse when you get to Short Stay.  . If you are admitted to the hospital after surgery: o Your blood sugar will be checked by the staff and you will probably be given insulin after surgery (instead of oral diabetes medicines) to make sure you have good blood sugar levels. o The goal for blood sugar control after surgery is 80-180 mg/dL.  WHAT DO I DO ABOUT MY DIABETES MEDICATION?  Marland Kitchen Do not take oral diabetes medicines (pills) the morning of surgery such as pioglitazone (ACTOS)  . THE NIGHT BEFORE SURGERY, take 35 units of Lantus insulin.      . The day of surgery, do not take other diabetes injectables, including Byetta (exenatide), Bydureon (exenatide ER), Victoza (liraglutide), or Trulicity (dulaglutide).  . If your CBG is greater than 220 mg/dL, you may take  of your sliding scale (correction) dose of insulin.  Reviewed and Endorsed by [facility] Patient Education Committee, August 2015  Do not wear jewelry, make-up or nail polish.  Do not wear lotions, powders, or perfumes, or deodorant.  Do not shave 48 hours prior to surgery.  Men may shave face and neck.  Do not bring valuables to the hospital.  [facility] is not responsible for any belongings or valuables.  Contacts, dentures or bridgework may not be worn into surgery.  Leave your suitcase in the car.  After surgery it may be brought to your room. For patients  admitted to the hospital, discharge time will be determined by your treatment team. Patients discharged the day of surgery will not be allowed to drive home.  Special instructions: Read " [place]-preparing For Surgery" sheet. Please read over the following fact sheets that you were given. Pain Booklet, Coughing and Deep Breathing, MRSA Information and Surgical Site Infection Prevention

## 2017-08-15 ENCOUNTER — Encounter (HOSPITAL_COMMUNITY)
Admission: RE | Admit: 2017-08-15 | Discharge: 2017-08-15 | Disposition: A | Discharge status: Home / Self Care | Payer: Medicare Other | Source: Ambulatory Visit | Attending: Surgery | Admitting: Surgery

## 2017-08-15 ENCOUNTER — Other Ambulatory Visit: Payer: Self-pay

## 2017-08-15 ENCOUNTER — Encounter (HOSPITAL_COMMUNITY): Payer: Self-pay

## 2017-08-15 DIAGNOSIS — I10 Essential (primary) hypertension: Secondary | ICD-10-CM | POA: Diagnosis not present

## 2017-08-15 DIAGNOSIS — B20 Human immunodeficiency virus [HIV] disease: Secondary | ICD-10-CM | POA: Diagnosis not present

## 2017-08-15 DIAGNOSIS — M1990 Unspecified osteoarthritis, unspecified site: Secondary | ICD-10-CM | POA: Diagnosis not present

## 2017-08-15 DIAGNOSIS — J45909 Unspecified asthma, uncomplicated: Secondary | ICD-10-CM | POA: Diagnosis not present

## 2017-08-15 DIAGNOSIS — Z87891 Personal history of nicotine dependence: Secondary | ICD-10-CM | POA: Diagnosis not present

## 2017-08-15 DIAGNOSIS — K219 Gastro-esophageal reflux disease without esophagitis: Secondary | ICD-10-CM | POA: Diagnosis not present

## 2017-08-15 DIAGNOSIS — I70212 Atherosclerosis of native arteries of extremities with intermittent claudication, left leg: Secondary | ICD-10-CM | POA: Diagnosis not present

## 2017-08-15 DIAGNOSIS — E1151 Type 2 diabetes mellitus with diabetic peripheral angiopathy without gangrene: Secondary | ICD-10-CM | POA: Diagnosis not present

## 2017-08-15 DIAGNOSIS — Z794 Long term (current) use of insulin: Secondary | ICD-10-CM | POA: Diagnosis not present

## 2017-08-15 HISTORY — DX: Presence of dental prosthetic device (complete) (partial): Z97.2

## 2017-08-15 LAB — GLUCOSE, CAPILLARY: Glucose-Capillary: 165 mg/dL — ABNORMAL HIGH (ref 65–99)

## 2017-08-15 LAB — POCT I-STAT, CHEM 8
BUN: 11 mg/dL (ref 6–20)
CALCIUM ION: 1.22 mmol/L (ref 1.15–1.40)
CHLORIDE: 102 mmol/L (ref 101–111)
Creatinine, Ser: 0.9 mg/dL (ref 0.61–1.24)
GLUCOSE: 87 mg/dL (ref 65–99)
HCT: 40 % (ref 39.0–52.0)
HEMOGLOBIN: 13.6 g/dL (ref 13.0–17.0)
Potassium: 4.1 mmol/L (ref 3.5–5.1)
SODIUM: 141 mmol/L (ref 135–145)
TCO2: 28 mmol/L (ref 22–32)

## 2017-08-15 LAB — SURGICAL PCR SCREEN
MRSA, PCR: NEGATIVE
STAPHYLOCOCCUS AUREUS: NEGATIVE

## 2017-08-15 NOTE — Progress Notes (Signed)
Pt denies SOB, chest pain, and being under the care of a cardiologist. Pt stated that his last dose of Plavix was Sunday as instructed by MD. Pt denies having a cardiac cath but stated that a stress test was performed > 10 years ago. Pt denies having a chest x ray within the last year.

## 2017-08-17 ENCOUNTER — Inpatient Hospital Stay (HOSPITAL_COMMUNITY): Payer: Medicare Other | Admitting: Certified Registered"

## 2017-08-17 ENCOUNTER — Encounter (HOSPITAL_COMMUNITY): Payer: Self-pay

## 2017-08-17 ENCOUNTER — Ambulatory Visit (HOSPITAL_COMMUNITY)
Admission: RE | Admit: 2017-08-17 | Discharge: 2017-08-17 | Disposition: A | Discharge status: Home / Self Care | Payer: Medicare Other | Source: Ambulatory Visit | Attending: Surgery | Admitting: Surgery

## 2017-08-17 ENCOUNTER — Encounter (HOSPITAL_COMMUNITY)
Admission: RE | Disposition: A | Discharge status: Home / Self Care | Payer: Self-pay | Source: Ambulatory Visit | Attending: Surgery

## 2017-08-17 DIAGNOSIS — I70212 Atherosclerosis of native arteries of extremities with intermittent claudication, left leg: Secondary | ICD-10-CM | POA: Diagnosis not present

## 2017-08-17 DIAGNOSIS — B20 Human immunodeficiency virus [HIV] disease: Secondary | ICD-10-CM | POA: Diagnosis not present

## 2017-08-17 DIAGNOSIS — I10 Essential (primary) hypertension: Secondary | ICD-10-CM | POA: Insufficient documentation

## 2017-08-17 DIAGNOSIS — K219 Gastro-esophageal reflux disease without esophagitis: Secondary | ICD-10-CM | POA: Diagnosis not present

## 2017-08-17 DIAGNOSIS — E1151 Type 2 diabetes mellitus with diabetic peripheral angiopathy without gangrene: Secondary | ICD-10-CM | POA: Diagnosis not present

## 2017-08-17 DIAGNOSIS — I70222 Atherosclerosis of native arteries of extremities with rest pain, left leg: Secondary | ICD-10-CM | POA: Diagnosis not present

## 2017-08-17 DIAGNOSIS — M1990 Unspecified osteoarthritis, unspecified site: Secondary | ICD-10-CM | POA: Diagnosis not present

## 2017-08-17 DIAGNOSIS — J45909 Unspecified asthma, uncomplicated: Secondary | ICD-10-CM | POA: Insufficient documentation

## 2017-08-17 DIAGNOSIS — Z87891 Personal history of nicotine dependence: Secondary | ICD-10-CM | POA: Diagnosis not present

## 2017-08-17 DIAGNOSIS — I2510 Atherosclerotic heart disease of native coronary artery without angina pectoris: Secondary | ICD-10-CM | POA: Diagnosis not present

## 2017-08-17 DIAGNOSIS — Z794 Long term (current) use of insulin: Secondary | ICD-10-CM | POA: Insufficient documentation

## 2017-08-17 DIAGNOSIS — I6529 Occlusion and stenosis of unspecified carotid artery: Secondary | ICD-10-CM | POA: Diagnosis not present

## 2017-08-17 HISTORY — PX: PERIPHERAL VASCULAR INTERVENTION: CATH118257

## 2017-08-17 HISTORY — PX: LOWER EXTREMITY ANGIOGRAM: SHX5508

## 2017-08-17 LAB — GLUCOSE, CAPILLARY
GLUCOSE-CAPILLARY: 151 mg/dL — AB (ref 65–99)
GLUCOSE-CAPILLARY: 126 mg/dL — AB (ref 65–99)

## 2017-08-17 SURGERY — ANGIOGRAM, LOWER EXTREMITY
Anesthesia: General | Site: Leg Lower | Laterality: Left

## 2017-08-17 MED ORDER — SODIUM CHLORIDE 0.9 % IV SOLN
INTRAVENOUS | Status: AC
  Filled 2017-08-17: qty 1.2

## 2017-08-17 MED ORDER — FENTANYL CITRATE (PF) 250 MCG/5ML IJ SOLN
INTRAMUSCULAR | Status: AC
  Filled 2017-08-17: qty 5

## 2017-08-17 MED ORDER — SUGAMMADEX SODIUM 200 MG/2ML IV SOLN
INTRAVENOUS | Status: DC | PRN
  Administered 2017-08-17: 170 mg via INTRAVENOUS

## 2017-08-17 MED ORDER — SODIUM CHLORIDE 0.9 % IV SOLN
INTRAVENOUS | Status: DC | PRN
  Administered 2017-08-17: 13:00:00

## 2017-08-17 MED ORDER — ONDANSETRON HCL 4 MG/2ML IJ SOLN
INTRAMUSCULAR | Status: DC | PRN
  Administered 2017-08-17: 4 mg via INTRAVENOUS

## 2017-08-17 MED ORDER — CEFAZOLIN SODIUM-DEXTROSE 1-4 GM/50ML-% IV SOLN
INTRAVENOUS | Status: DC | PRN
  Administered 2017-08-17: 1 g via INTRAVENOUS

## 2017-08-17 MED ORDER — PROPOFOL 10 MG/ML IV BOLUS
INTRAVENOUS | Status: AC
  Filled 2017-08-17: qty 20

## 2017-08-17 MED ORDER — SUGAMMADEX SODIUM 200 MG/2ML IV SOLN
INTRAVENOUS | Status: AC
  Filled 2017-08-17: qty 2

## 2017-08-17 MED ORDER — PHENYLEPHRINE 40 MCG/ML (10ML) SYRINGE FOR IV PUSH (FOR BLOOD PRESSURE SUPPORT)
PREFILLED_SYRINGE | INTRAVENOUS | Status: AC
  Filled 2017-08-17: qty 10

## 2017-08-17 MED ORDER — LIDOCAINE HCL (CARDIAC) 20 MG/ML IV SOLN
INTRAVENOUS | Status: DC | PRN
  Administered 2017-08-17: 60 mg via INTRAVENOUS

## 2017-08-17 MED ORDER — LABETALOL HCL 5 MG/ML IV SOLN
INTRAVENOUS | Status: AC
  Filled 2017-08-17: qty 4

## 2017-08-17 MED ORDER — FENTANYL CITRATE (PF) 100 MCG/2ML IJ SOLN
INTRAMUSCULAR | Status: DC | PRN
  Administered 2017-08-17 (×2): 50 ug via INTRAVENOUS
  Administered 2017-08-17: 100 ug via INTRAVENOUS

## 2017-08-17 MED ORDER — EPHEDRINE 5 MG/ML INJ
INTRAVENOUS | Status: AC
  Filled 2017-08-17: qty 20

## 2017-08-17 MED ORDER — PHENYLEPHRINE HCL 10 MG/ML IJ SOLN
INTRAVENOUS | Status: DC | PRN
  Administered 2017-08-17: 25 ug/min via INTRAVENOUS

## 2017-08-17 MED ORDER — PROTAMINE SULFATE 10 MG/ML IV SOLN
INTRAVENOUS | Status: DC | PRN
  Administered 2017-08-17: 25 mg via INTRAVENOUS

## 2017-08-17 MED ORDER — OXYCODONE-ACETAMINOPHEN 5-325 MG PO TABS: 1.0000 | ORAL_TABLET | Freq: Four times a day (QID) | ORAL | 0 refills | Status: DC | PRN

## 2017-08-17 MED ORDER — HEPARIN SODIUM (PORCINE) 1000 UNIT/ML IJ SOLN
INTRAMUSCULAR | Status: DC | PRN
  Administered 2017-08-17: 8000 [IU] via INTRAVENOUS
  Administered 2017-08-17: 2000 [IU] via INTRAVENOUS

## 2017-08-17 MED ORDER — FENTANYL CITRATE (PF) 100 MCG/2ML IJ SOLN: 25.0000 ug | INTRAMUSCULAR | Status: DC | PRN

## 2017-08-17 MED ORDER — EPHEDRINE SULFATE 50 MG/ML IJ SOLN
INTRAMUSCULAR | Status: DC | PRN
  Administered 2017-08-17 (×2): 5 mg via INTRAVENOUS
  Administered 2017-08-17: 10 mg via INTRAVENOUS

## 2017-08-17 MED ORDER — ROCURONIUM BROMIDE 100 MG/10ML IV SOLN
INTRAVENOUS | Status: DC | PRN
  Administered 2017-08-17: 50 mg via INTRAVENOUS
  Administered 2017-08-17: 20 mg via INTRAVENOUS

## 2017-08-17 MED ORDER — LACTATED RINGERS IV SOLN
INTRAVENOUS | Status: DC
  Administered 2017-08-17 (×2): via INTRAVENOUS

## 2017-08-17 MED ORDER — SUCCINYLCHOLINE CHLORIDE 200 MG/10ML IV SOSY
PREFILLED_SYRINGE | INTRAVENOUS | Status: AC
  Filled 2017-08-17: qty 10

## 2017-08-17 MED ORDER — CEFAZOLIN SODIUM 1 G IJ SOLR
INTRAMUSCULAR | Status: AC
  Filled 2017-08-17: qty 50

## 2017-08-17 MED ORDER — ONDANSETRON HCL 4 MG/2ML IJ SOLN
INTRAMUSCULAR | Status: AC
  Filled 2017-08-17: qty 4

## 2017-08-17 MED ORDER — SODIUM CHLORIDE 0.9 % IJ SOLN
INTRAMUSCULAR | Status: DC | PRN
  Administered 2017-08-17: 100 mL via INTRAMUSCULAR

## 2017-08-17 MED ORDER — PROPOFOL 10 MG/ML IV BOLUS
INTRAVENOUS | Status: DC | PRN
  Administered 2017-08-17: 110 mg via INTRAVENOUS

## 2017-08-17 SURGICAL SUPPLY — 98 items
ADH SKN CLS APL DERMABOND .7 (GAUZE/BANDAGES/DRESSINGS) ×2
ADH SKN CLS LQ APL DERMABOND (GAUZE/BANDAGES/DRESSINGS) ×2
BAG BANDED W/RUBBER/TAPE 36X54 (MISCELLANEOUS) ×4 IMPLANT
BAG EQP BAND 135X91 W/RBR TAPE (MISCELLANEOUS) ×2
BAG SNAP BAND KOVER 36X36 (MISCELLANEOUS) ×6 IMPLANT
BALLN MUSTANG 5X60X75 (BALLOONS) ×4
BALLOON MUSTANG 5X60X75 (BALLOONS) IMPLANT
BANDAGE ACE 4X5 VEL STRL LF (GAUZE/BANDAGES/DRESSINGS) IMPLANT
BANDAGE ESMARK 6X9 LF (GAUZE/BANDAGES/DRESSINGS) IMPLANT
BLADE SURG 11 STRL SS (BLADE) ×4 IMPLANT
BNDG CMPR 9X6 STRL LF SNTH (GAUZE/BANDAGES/DRESSINGS)
BNDG ESMARK 6X9 LF (GAUZE/BANDAGES/DRESSINGS)
CANISTER SUCT 3000ML PPV (MISCELLANEOUS) ×4 IMPLANT
CATH ANGIO 5F BER2 65CM (CATHETERS) IMPLANT
CATH BEACON 5 .035 65 KMP TIP (CATHETERS) ×2 IMPLANT
CATH OMNI FLUSH .035X70CM (CATHETERS) IMPLANT
CATH QUICKCROSS SUPP .035X90CM (MICROCATHETER) ×2 IMPLANT
CHLORAPREP W/TINT 26ML (MISCELLANEOUS) IMPLANT
CLIP VESOCCLUDE MED 24/CT (CLIP) ×4 IMPLANT
CLIP VESOCCLUDE SM WIDE 24/CT (CLIP) ×4 IMPLANT
COVER DOME SNAP 22 D (MISCELLANEOUS) ×6 IMPLANT
COVER PROBE W GEL 5X96 (DRAPES) ×6 IMPLANT
COVER SURGICAL LIGHT HANDLE (MISCELLANEOUS) ×4 IMPLANT
CUFF TOURNIQUET SINGLE 24IN (TOURNIQUET CUFF) IMPLANT
CUFF TOURNIQUET SINGLE 34IN LL (TOURNIQUET CUFF) IMPLANT
CUFF TOURNIQUET SINGLE 44IN (TOURNIQUET CUFF) IMPLANT
DERMABOND ADHESIVE PROPEN (GAUZE/BANDAGES/DRESSINGS) ×2
DERMABOND ADVANCED (GAUZE/BANDAGES/DRESSINGS) ×2
DERMABOND ADVANCED .7 DNX12 (GAUZE/BANDAGES/DRESSINGS) ×2 IMPLANT
DERMABOND ADVANCED .7 DNX6 (GAUZE/BANDAGES/DRESSINGS) ×2 IMPLANT
DEVICE CLOSURE MYNXGRIP 6/7F (Vascular Products) ×2 IMPLANT
DEVICE TORQUE H2O (MISCELLANEOUS) ×2 IMPLANT
DRAIN CHANNEL 15F RND FF W/TCR (WOUND CARE) IMPLANT
DRAPE FEMORAL ANGIO 80X135IN (DRAPES) ×4 IMPLANT
DRAPE INCISE IOBAN 66X45 STRL (DRAPES) ×2 IMPLANT
DRAPE X-RAY CASS 24X20 (DRAPES) IMPLANT
DRSG TEGADERM 2-3/8X2-3/4 SM (GAUZE/BANDAGES/DRESSINGS) ×2 IMPLANT
DRSG TEGADERM 4X4.75 (GAUZE/BANDAGES/DRESSINGS) ×4 IMPLANT
ELECT REM PT RETURN 9FT ADLT (ELECTROSURGICAL) ×4
ELECTRODE REM PT RTRN 9FT ADLT (ELECTROSURGICAL) ×2 IMPLANT
EVACUATOR SILICONE 100CC (DRAIN) IMPLANT
GAUZE SPONGE 2X2 8PLY STRL LF (GAUZE/BANDAGES/DRESSINGS) IMPLANT
GAUZE SPONGE 4X4 16PLY XRAY LF (GAUZE/BANDAGES/DRESSINGS) ×4 IMPLANT
GLOVE BIOGEL PI IND STRL 7.5 (GLOVE) ×2 IMPLANT
GLOVE BIOGEL PI INDICATOR 7.5 (GLOVE) ×2
GLOVE SURG SS PI 7.5 STRL IVOR (GLOVE) ×4 IMPLANT
GOWN STRL REUS W/ TWL LRG LVL3 (GOWN DISPOSABLE) ×4 IMPLANT
GOWN STRL REUS W/ TWL XL LVL3 (GOWN DISPOSABLE) ×2 IMPLANT
GOWN STRL REUS W/TWL LRG LVL3 (GOWN DISPOSABLE) ×8
GOWN STRL REUS W/TWL XL LVL3 (GOWN DISPOSABLE) ×4
GUIDEWIRE ANGLED .035X150CM (WIRE) ×2 IMPLANT
HEMOSTAT SNOW SURGICEL 2X4 (HEMOSTASIS) IMPLANT
KIT BASIN OR (CUSTOM PROCEDURE TRAY) ×4 IMPLANT
KIT ENCORE 26 ADVANTAGE (KITS) ×2 IMPLANT
KIT TURNOVER KIT B (KITS) ×4 IMPLANT
MARKER GRAFT CORONARY BYPASS (MISCELLANEOUS) IMPLANT
NDL PERC 18GX7CM (NEEDLE) ×2 IMPLANT
NEEDLE PERC 18GX7CM (NEEDLE) ×4 IMPLANT
NS IRRIG 1000ML POUR BTL (IV SOLUTION) ×8 IMPLANT
PACK PERIPHERAL VASCULAR (CUSTOM PROCEDURE TRAY) ×4 IMPLANT
PACK SURGICAL SETUP 50X90 (CUSTOM PROCEDURE TRAY) ×4 IMPLANT
PAD ARMBOARD 7.5X6 YLW CONV (MISCELLANEOUS) ×8 IMPLANT
PROTECTION STATION PRESSURIZED (MISCELLANEOUS) ×8
SET COLLECT BLD 21X3/4 12 (NEEDLE) IMPLANT
SET MICROPUNCTURE 5F STIFF (MISCELLANEOUS) ×10 IMPLANT
SHEATH AVANTI 11CM 5FR (SHEATH) ×4 IMPLANT
SHEATH HIGHFLEX ANSEL 6FRX55 (SHEATH) ×2 IMPLANT
SHEATH PINNACLE 6F 10CM (SHEATH) ×4 IMPLANT
SHIELD RADPAD SCOOP 12X17 (MISCELLANEOUS) ×2 IMPLANT
SPONGE GAUZE 2X2 STER 10/PKG (GAUZE/BANDAGES/DRESSINGS) ×2
STATION PROTECTION PRESSURIZED (MISCELLANEOUS) ×2 IMPLANT
STENT TIGRIS 6X80X120 (Permanent Stent) ×2 IMPLANT
STOPCOCK 4 WAY LG BORE MALE ST (IV SETS) IMPLANT
STOPCOCK MORSE 400PSI 3WAY (MISCELLANEOUS) ×6 IMPLANT
SUT ETHILON 3 0 PS 1 (SUTURE) IMPLANT
SUT PROLENE 5 0 C 1 24 (SUTURE) ×4 IMPLANT
SUT PROLENE 6 0 BV (SUTURE) ×4 IMPLANT
SUT PROLENE 7 0 BV 1 (SUTURE) IMPLANT
SUT SILK 3 0 (SUTURE)
SUT SILK 3-0 18XBRD TIE 12 (SUTURE) IMPLANT
SUT VIC AB 2-0 CT1 27 (SUTURE) ×8
SUT VIC AB 2-0 CT1 TAPERPNT 27 (SUTURE) ×4 IMPLANT
SUT VIC AB 3-0 SH 27 (SUTURE) ×8
SUT VIC AB 3-0 SH 27X BRD (SUTURE) ×4 IMPLANT
SUT VICRYL 4-0 PS2 18IN ABS (SUTURE) ×8 IMPLANT
SYR 10ML LL (SYRINGE) ×18 IMPLANT
SYR 20CC LL (SYRINGE) ×6 IMPLANT
SYR 30ML LL (SYRINGE) ×4 IMPLANT
SYR MEDRAD MARK V 150ML (SYRINGE) IMPLANT
TOWEL GREEN STERILE (TOWEL DISPOSABLE) ×8 IMPLANT
TRAY FOLEY MTR SLVR 16FR STAT (SET/KITS/TRAYS/PACK) ×4 IMPLANT
TUBING CIL FLEX 10 FLL-RA (TUBING) ×2 IMPLANT
TUBING EXTENTION W/L.L. (IV SETS) IMPLANT
TUBING HIGH PRESSURE 120CM (CONNECTOR) ×6 IMPLANT
UNDERPAD 30X30 (UNDERPADS AND DIAPERS) ×4 IMPLANT
WATER STERILE IRR 1000ML POUR (IV SOLUTION) ×4 IMPLANT
WIRE BENTSON .035X145CM (WIRE) ×6 IMPLANT
WIRE HI TORQ VERSACORE J 260CM (WIRE) ×2 IMPLANT

## 2017-08-17 NOTE — Anesthesia Procedure Notes (Signed)
Procedure Name: Intubation Date/Time: 08/17/2017 12:57 PM Performed by: Lance Coon, CRNA Pre-anesthesia Checklist: Patient identified, Emergency Drugs available, Suction available and Patient being monitored Patient Re-evaluated:Patient Re-evaluated prior to induction Oxygen Delivery Method: Circle System Utilized Preoxygenation: Pre-oxygenation with 100% oxygen Induction Type: IV induction Ventilation: Oral airway inserted - appropriate to patient size Laryngoscope Size: Mac and 3 Grade View: Grade I Tube type: Oral Number of attempts: 1 Airway Equipment and Method: Stylet and Oral airway Placement Confirmation: ETT inserted through vocal cords under direct vision,  positive ETCO2 and breath sounds checked- equal and bilateral Secured at: 21 cm Tube secured with: Tape Dental Injury: Teeth and Oropharynx as per pre-operative assessment

## 2017-08-17 NOTE — Discharge Instructions (Signed)
° °  [facility]  ° °Discharge Instructions ° °Lower Extremity Angiogram; Angioplasty/Stenting ° °Please refer to the following instructions for your post-procedure care. Your surgeon or physician assistant will discuss any changes with you. ° °Activity ° °Avoid lifting more than 8 pounds (1 gallons of milk) for 72 hours (3 days) after your procedure. You may walk as much as you can tolerate. It's OK to drive after 72 hours. ° °Bathing/Showering ° °You may shower the day after your procedure. If you have a bandage, you may remove it at 24- 48 hours. Clean your incision site with mild soap and water. Pat the area dry with a clean towel. ° °Diet ° °Resume your pre-procedure diet. There are no special food restrictions following this procedure. All patients with peripheral vascular disease should follow a low fat/low cholesterol diet. In order to heal from your surgery, it is CRITICAL to get adequate nutrition. Your body requires vitamins, minerals, and protein. Vegetables are the best source of vitamins and minerals. Vegetables also provide the perfect balance of protein. Processed food has little nutritional value, so try to avoid this. ° °Medications ° °Resume taking all of your medications unless your doctor tells you not to. If your incision is causing pain, you may take over-the-counter pain relievers such as acetaminophen (Tylenol) ° °Follow Up ° °Follow up will be arranged at the time of your procedure. You may have an office visit scheduled or may be scheduled for surgery. Ask your surgeon if you have any questions. ° °Please call us immediately for any of the following conditions: °•Severe or worsening pain your legs or feet at rest or with walking. °•Increased pain, redness, drainage at your groin puncture site. °•Fever of 101 degrees or higher. °•If you have any mild or slow bleeding from your puncture site: lie down, apply firm constant pressure over the area with a piece of  gauze or a clean wash cloth for 30 minutes- no peeking!, call 911 right away if you are still bleeding after 30 minutes, or if the bleeding is heavy and unmanageable. ° °Reduce your risk factors of vascular disease: ° °Stop smoking. If you would like help call [facility] at 1-800-QUIT-NOW (1-800-784-8669) or [place] at 336-586-4000. °Manage your cholesterol °Maintain a desired weight °Control your diabetes °Keep your blood pressure down ° °If you have any questions, please call the office at [phone] ° °

## 2017-08-17 NOTE — Anesthesia Postprocedure Evaluation (Signed)
Anesthesia Post Note  Patient: Nathan [name]  Procedure(s) Performed: LOWER EXTREMITY ANGIOGRAM LEFT LEG WITH RUNOFF AND Stenting. (Left Leg Lower) POPLITEAL STENT (Left )     Patient location during evaluation: PACU Anesthesia Type: General Level of consciousness: awake and alert Pain management: pain level controlled Vital Signs Assessment: post-procedure vital signs reviewed and stable Respiratory status: spontaneous breathing, nonlabored ventilation and respiratory function stable Cardiovascular status: blood pressure returned to baseline and stable Postop Assessment: no apparent nausea or vomiting Anesthetic complications: no    Last Vitals:  Vitals:   08/17/17 1606 08/17/17 1615  BP:  (!) 120/59  Pulse: 78 77  Resp: 14 15  Temp:    SpO2: 92% 92%    Last Pain:  Vitals:   08/17/17 1615  TempSrc:   PainSc: Asleep                 Jeanene Mena,W. EDMOND

## 2017-08-17 NOTE — Anesthesia Preprocedure Evaluation (Signed)
Anesthesia Evaluation  Patient identified by MRN, date of birth, ID band Patient awake    Reviewed: Allergy & Precautions, H&P , NPO status , Patient's Chart, lab work & pertinent test results  Airway Mallampati: II  TM Distance: >3 FB Neck ROM: Full    Dental no notable dental hx. (+) Upper Dentures, Lower Dentures, Dental Advisory Given   Pulmonary asthma , former smoker,    Pulmonary exam normal breath sounds clear to auscultation       Cardiovascular hypertension, Pt. on medications + Peripheral Vascular Disease   Rhythm:Regular Rate:Normal     Neuro/Psych Depression negative neurological ROS     GI/Hepatic Neg liver ROS, GERD  Medicated and Controlled,  Endo/Other  diabetes, Insulin Dependent  Renal/GU negative Renal ROS  negative genitourinary   Musculoskeletal  (+) Arthritis , Osteoarthritis,    Abdominal   Peds  Hematology  (+) HIV,   Anesthesia Other Findings   Reproductive/Obstetrics negative OB ROS                             Anesthesia Physical Anesthesia Plan  ASA: III  Anesthesia Plan: General   Post-op Pain Management:    Induction: Intravenous  PONV Risk Score and Plan: 3 and Ondansetron, Midazolam and Treatment may vary due to age or medical condition  Airway Management Planned: Oral ETT  Additional Equipment:   Intra-op Plan:   Post-operative Plan: Extubation in OR  Informed Consent: I have reviewed the patients History and Physical, chart, labs and discussed the procedure including the risks, benefits and alternatives for the proposed anesthesia with the patient or authorized representative who has indicated his/her understanding and acceptance.   Dental advisory given  Plan Discussed with: CRNA  Anesthesia Plan Comments:         Anesthesia Quick Evaluation

## 2017-08-17 NOTE — H&P (Signed)
   Patient name: Nathan [name] MRN: [id number] DOB: 08/18/1950 Sex: male    HISTORY OF PRESENT ILLNESS:   Nathan [name] is a 67 y.o. male with left leg claudication.  He couldnot be fixed in the cath lab and comes in today for an attempt in the [place]:    Current Facility-Administered Medications  Medication Dose Route Frequency Provider Last Rate Last Dose  . lactated ringers infusion   Intravenous Continuous Roderic Palau, MD 10 mL/hr at 08/17/17 1139      REVIEW OF SYSTEMS:   [X]  denotes positive finding, [ ]  denotes negative finding Cardiac  Comments:  Chest pain or chest pressure:    Shortness of breath upon exertion:    Short of breath when lying flat:    Irregular heart rhythm:    Constitutional    Fever or chills:      PHYSICAL EXAM:   Vitals:   08/17/17 1125  BP: (!) 147/71  Pulse: 68  Resp: 18  Temp: 97.8 F (36.6 C)  TempSrc: Oral  SpO2: 96%  Weight: 183 lb 8 oz (83.2 kg)  Height: 6\' 1"  (1.854 m)    GENERAL: The patient is a well-nourished male, in no acute distress. The vital signs are documented above. CARDIOVASCULAR: There is a regular rate and rhythm. PULMONARY: Non-labored respirations   STUDIES:   noe   MEDICAL ISSUES:   Plan left leg angio and stent, possible open femoral artery exposure  Annamarie Major, MD [facility] [phone] Pager [phone]

## 2017-08-17 NOTE — Transfer of Care (Signed)
Immediate Anesthesia Transfer of Care Note  Patient: Nathan [name]  Procedure(s) Performed: LOWER EXTREMITY ANGIOGRAM LEFT LEG WITH RUNOFF AND Stenting. (Left Leg Lower)  Patient Location: PACU  Anesthesia Type:General  Level of Consciousness: awake and patient cooperative  Airway & Oxygen Therapy: Patient Spontanous Breathing  Post-op Assessment: Report given to RN and Post -op Vital signs reviewed and stable  Post vital signs: Reviewed and stable  Last Vitals:  Vitals Value Taken Time  BP 130/61 08/17/2017  3:13 PM  Temp    Pulse 86 08/17/2017  3:14 PM  Resp 17 08/17/2017  3:14 PM  SpO2 95 % 08/17/2017  3:14 PM  Vitals shown include unvalidated device data.  Last Pain:  Vitals:   08/17/17 1135  TempSrc:   PainSc: 3       Patients Stated Pain Goal: 0 (23/76/28 3151)  Complications: No apparent anesthesia complications

## 2017-08-18 LAB — POCT ACTIVATED CLOTTING TIME: ACTIVATED CLOTTING TIME: 197 s

## 2017-08-19 ENCOUNTER — Other Ambulatory Visit: Payer: Self-pay | Admitting: Internal Medicine

## 2017-08-19 DIAGNOSIS — E1142 Type 2 diabetes mellitus with diabetic polyneuropathy: Secondary | ICD-10-CM

## 2017-08-19 NOTE — Op Note (Signed)
    Patient name: Nathan [name] MRN: [id number] DOB: Sep 29, 1950 Sex: male  08/17/2017 Pre-operative Diagnosis: left leg rest pain Post-operative diagnosis:  Same Surgeon:  Annamarie Major Procedure Performed:  1.  U/s guided antegrade access, left femoral artery  2.  Left leg angiogram  3.  Stent, left popliteal artery  4.  Closure device (mynx)     Indications:  Patient has a history of leg leg stenting.  Recent angio shows high grade popliteal stenosis.  He is here for intervention under general anesthesia, given high femoral bifurcation  Procedure:  The patient was identified in the holding area and taken to room 8.  The patient was then placed supine on the table and prepped and draped in the usual sterile fashion.  A time out was called.  Ultrasound was used to evaluate the left common femoral artery.  It was patent .  A digital ultrasound image was acquired.  A micropuncture needle was used to access the left common femoral artery under ultrasound guidance in a antegrade fashion.  The wire was advanced and went into the profundofemoral artery.  I ultimately placed a 7 French 45 cm sheath and used a Kumpe catheter to direct the wire into the superficial femoral artery.  I was unable to advance the 7 French sheath in the superficial femoral artery.  The patient was fully heparinized.  Left leg imaging was then performed Findings:     Left Lower Extremity: Left superficial femoral artery is patent throughout its course with some haziness at the adductor canal.  The stent within the popliteal artery is widely patent.  There are 2 focal lesions within the popliteal artery, the distal most of which is at the joint space.  The anterior tibial artery fills retrograde likely from proximal disease.  The peroneal artery reconstitutes.  Intervention: After the above images were acquired, I used a 035 Glidewire and a 90 cm quick cross to get access into the below-knee popliteal artery.  A contrast  injection was performed confirming successful crossing of the lesion.  A versa core wire was then placed.  I then selected a 6 x 80 stent and deployed thisTigris beginning at the joint space.  It ended up just short of the more proximal stent.  It was angioplastied with a 5 mm balloon.  Completion imaging revealed widely patent popliteal artery with no change in runoff.  The long sheath was exchanged out for a short sheath and a Mynxdevice was used for closure.  Impression:  #1  Successful stenting of a near occlusive tandem left popliteal artery stenosis using a 6 x 80 Tigris stent with no residual stenosis.     Theotis Burrow, M.D. [facility] [place] Office: [phone] Pager:  [phone]

## 2017-08-20 ENCOUNTER — Encounter (HOSPITAL_COMMUNITY): Payer: Self-pay | Admitting: Surgery

## 2017-08-20 ENCOUNTER — Other Ambulatory Visit: Payer: Self-pay

## 2017-08-20 ENCOUNTER — Telehealth: Payer: Self-pay | Admitting: Surgery

## 2017-08-20 DIAGNOSIS — Z48812 Encounter for surgical aftercare following surgery on the circulatory system: Secondary | ICD-10-CM

## 2017-08-20 DIAGNOSIS — I739 Peripheral vascular disease, unspecified: Secondary | ICD-10-CM

## 2017-08-20 DIAGNOSIS — I779 Disorder of arteries and arterioles, unspecified: Secondary | ICD-10-CM

## 2017-08-20 DIAGNOSIS — Z89611 Acquired absence of right leg above knee: Secondary | ICD-10-CM

## 2017-08-20 NOTE — Telephone Encounter (Signed)
-----   Message from Mena Goes, RN sent at 08/20/2017  9:56 AM EDT ----- Regarding: 1 month with NP and lab   ----- Message ----- From: Serafina Mitchell, MD Sent: 08/19/2017   9:22 PM To: Vvs Charge Pool  08-17-2017:    Surgeon:  Annamarie Major Procedure Performed:  1.  U/s guided antegrade access, left femoral artery  2.  Left leg angiogram  3.  Stent, left popliteal artery  4.  Closure device (mynx)  Follow-up with Vinnie Level in 1 month with ABIs and duplex of the left leg

## 2017-08-20 NOTE — Telephone Encounter (Signed)
LVM for pts 4/22 carotid 5/17 [place] and OV  Mld lttr

## 2017-08-27 ENCOUNTER — Ambulatory Visit (HOSPITAL_COMMUNITY)
Admission: RE | Admit: 2017-08-27 | Discharge: 2017-08-27 | Disposition: A | Discharge status: Home / Self Care | Payer: Medicare Other | Source: Ambulatory Visit | Attending: Vascular Surgery | Admitting: Vascular Surgery

## 2017-08-27 DIAGNOSIS — I6523 Occlusion and stenosis of bilateral carotid arteries: Secondary | ICD-10-CM | POA: Diagnosis not present

## 2017-08-28 ENCOUNTER — Other Ambulatory Visit: Payer: Self-pay | Admitting: Internal Medicine

## 2017-08-28 DIAGNOSIS — E1142 Type 2 diabetes mellitus with diabetic polyneuropathy: Secondary | ICD-10-CM

## 2017-09-14 ENCOUNTER — Other Ambulatory Visit: Payer: Self-pay | Admitting: Internal Medicine

## 2017-09-14 DIAGNOSIS — E7800 Pure hypercholesterolemia, unspecified: Secondary | ICD-10-CM

## 2017-09-19 ENCOUNTER — Other Ambulatory Visit: Payer: Self-pay

## 2017-09-19 DIAGNOSIS — I779 Disorder of arteries and arterioles, unspecified: Secondary | ICD-10-CM

## 2017-09-21 ENCOUNTER — Ambulatory Visit (INDEPENDENT_AMBULATORY_CARE_PROVIDER_SITE_OTHER): Payer: Self-pay | Admitting: Family

## 2017-09-21 ENCOUNTER — Encounter: Payer: Self-pay | Admitting: Family

## 2017-09-21 ENCOUNTER — Ambulatory Visit (INDEPENDENT_AMBULATORY_CARE_PROVIDER_SITE_OTHER)
Admission: RE | Admit: 2017-09-21 | Discharge: 2017-09-21 | Disposition: A | Discharge status: Home / Self Care | Payer: Medicare Other | Source: Ambulatory Visit | Attending: Family | Admitting: Family

## 2017-09-21 ENCOUNTER — Ambulatory Visit (HOSPITAL_COMMUNITY)
Admission: RE | Admit: 2017-09-21 | Discharge: 2017-09-21 | Disposition: A | Discharge status: Home / Self Care | Payer: Medicare Other | Source: Ambulatory Visit | Attending: Family | Admitting: Family

## 2017-09-21 ENCOUNTER — Other Ambulatory Visit: Payer: Self-pay

## 2017-09-21 VITALS — BP 125/72 | HR 76 | Resp 18 | Ht 73.0 in | Wt 183.0 lb

## 2017-09-21 DIAGNOSIS — I6523 Occlusion and stenosis of bilateral carotid arteries: Secondary | ICD-10-CM

## 2017-09-21 DIAGNOSIS — I779 Disorder of arteries and arterioles, unspecified: Secondary | ICD-10-CM

## 2017-09-21 DIAGNOSIS — Z89611 Acquired absence of right leg above knee: Secondary | ICD-10-CM

## 2017-09-21 DIAGNOSIS — I739 Peripheral vascular disease, unspecified: Secondary | ICD-10-CM

## 2017-09-21 DIAGNOSIS — Z48812 Encounter for surgical aftercare following surgery on the circulatory system: Secondary | ICD-10-CM

## 2017-09-21 DIAGNOSIS — Z87891 Personal history of nicotine dependence: Secondary | ICD-10-CM

## 2017-09-21 NOTE — Patient Instructions (Signed)

## 2017-09-21 NOTE — Progress Notes (Signed)
Postoperative Visit   History of Present Illness  Nathan [name] is a 67 y.o. male who is s/p stent placement of left popliteal artery on 08-17-17 by Dr. Trula Slade for left leg rest pain.  He is also s/pleft SFA stenting in 2010. He is s/p right AKAin 2009.  Ptis ambulating with a right above-knee prosthesis. Heworked with [employer] to address themoderate pain he was havingat the bony prominence of the right AKA stump with walking, and had resolution of this pain. He denies any stump wounds. He has stinging and burning in foot up to left knee, attributes to neuropathy.  Pt states left calf feels much improved since the procedure on 08-17-17, but left foot feels no improvement, no wounds per pt.  He states his left great toe has had numbness intermittently over the years.   He has MS, which he states has been in remission for years.   Pt denies any history of stroke or TIA.  He was evaluated by K. Trinh, PA-C, on 07-17-16. At that time left SFA stentwas patent and ABIs stable.He continuedto have some neuropathic type symptoms in the left leg and foot. Ambulating well with right leg prosthesis. Pt advised to continue maximal medical management with diabetes control, plavix and statin. Follow up in one year with repeat ABIs and left leg arterial duplex. Carotid duplex demonstrated60-79% bilateral carotid stenosis, he hadbeen asymptomatic. His intermittent bilateral eye blurriness resolvedwith blinking and hadbeen unchanged for the priorseveral years. Didnot appear to be amaurosis fugax. Did discuss with Dr. Trula Slade that he is below the 80% threshold for surgical intervention. Pt was tofollow up in 6 months with repeat carotid duplex. The patient knows to seek emergency assistance if he develops any TIA or stroke symptoms.   The patient is able to complete his activities of daily living.     Pt Diabetic: Yes,6.0 A1C on 07-26-17 Pt smoker: former smoker, quit in 2009  Pt  meds include: Statin :Yes ASA: No Other anticoagulants/antiplatelets: Plavix    For VQI Use Only  PRE-ADM LIVING: Home  AMB STATUS: Ambulatory with right AKA prosthesis  Past Medical History:  Diagnosis Date  . Asthma    per 2003 [facility] pulm records pfts   . Blood dyscrasia    HIV  . Clotting disorder ([place])   . Colon polyps    noted previous colonoscopy [facility]  . DDD (degenerative disc disease)    cervical spine  . Depression   . Diabetes mellitus    dx 2010  . Fever    unknwon origin  . GERD (gastroesophageal reflux disease)   . Head swelling 05/23/2017  . Hep C w/o coma, chronic ([place])   . History of syphilis    noted [facility] records  . HIV infection ([place])    undetectable viral load and CD4 ct 667 as of 11/2011  . Hyperlipidemia   . Hypertension   . MRSA (methicillin resistant Staphylococcus aureus)   . Multiple sclerosis ([place])    in remission as of 11/2011 (diagnosed late 1980s)  . Pain in limb-Right Leg 10/13/2013  . 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 Pneumonia   . PVD (peripheral vascular disease) ([place])    Left Stent 07/02/2008  . Scalp lesion 07/26/2017  . UTI (urinary tract infection)   . Wears dentures     Past Surgical History:  Procedure Laterality Date  . ABDOMINAL AORTOGRAM W/LOWER EXTREMITY N/A 08/07/2017   Procedure: ABDOMINAL AORTOGRAM W/LOWER EXTREMITY;  Surgeon:  Serafina Mitchell, MD;  Location: [facility];  Service: Cardiovascular;  Laterality: N/A;  . ABOVE KNEE LEG AMPUTATION     Right Leg  . COLONOSCOPY W/ BIOPSIES AND POLYPECTOMY    . LOWER EXTREMITY ANGIOGRAM Left 12/26/2011   Procedure: LOWER EXTREMITY ANGIOGRAM;  Surgeon: Serafina Mitchell, MD;  Location: [facility] CATH LAB;  Service: Cardiovascular;  Laterality: Left;  . LOWER EXTREMITY ANGIOGRAM Left 08/17/2017   Procedure: LOWER EXTREMITY ANGIOGRAM LEFT LEG WITH RUNOFF AND Stenting.;  Surgeon: Serafina Mitchell, MD;  Location: [facility];  Service: Vascular;  Laterality: Left;  Marland Kitchen  MULTIPLE TOOTH EXTRACTIONS    . OTHER SURGICAL HISTORY     right lower ext AKA with prothesis   . OTHER SURGICAL HISTORY     left left with stents (Dr. Seward Speck)  . OTHER SURGICAL HISTORY     2003 colonoscopy 5 mm polyp transverse colon; (2)79m  polyps in rectum-hyperplastic  . PERIPHERAL VASCULAR INTERVENTION Left 08/17/2017   Procedure: POPLITEAL STENT;  Surgeon: BSerafina Mitchell MD;  [facility];  Service: Vascular;  Laterality: Left;    Social History   Socioeconomic History  . Marital status: Single    Spouse name: Not on file  . Number of children: Not on file  . Years of education: 161 . Highest education level: Not on file  Occupational History  . Not on file  Social Needs  . Financial resource strain: Not on file  . Food insecurity:    Worry: Not on file    Inability: Not on file  . Transportation needs:    Medical: Not on file    Non-medical: Not on file  Tobacco Use  . Smoking status: Former Smoker    Last attempt to quit: 05/09/2007    Years since quitting: 10.3  . Smokeless tobacco: Former [employer] developer   Types: Chew  Substance and Sexual Activity  . Alcohol use: No    Alcohol/week: 0.0 oz  . Drug use: No  . Sexual activity: Yes    Partners: Male  Lifestyle  . Physical activity:    Days per week: Not on file    Minutes per session: Not on file  . Stress: Not on file  Relationships  . Social connections:    Talks on phone: Not on file    Gets together: Not on file    Attends religious service: Not on file    Active member of club or organization: Not on file    Attends meetings of clubs or organizations: Not on file    Relationship status: Not on file  . Intimate partner violence:    Fear of current or ex partner: Not on file    Emotionally abused: Not on file    Physically abused: Not on file    Forced sexual activity: Not on file  Other Topics Concern  . Not on file  Social History Narrative  . Not on file    Allergies  Allergen  Reactions  . Sulfonamide Derivatives Hives    Current Outpatient Medications on File Prior to Visit  Medication Sig Dispense Refill  . atorvastatin (LIPITOR) 40 MG tablet TAKE 1 TABLET(40 MG) BY MOUTH DAILY 90 tablet 1  . bictegravir-emtricitabine-tenofovir AF (BIKTARVY) 50-200-25 MG TABS tablet Take 1 tablet by mouth daily. 30 tablet 11  . Blood Glucose Monitoring Suppl (BAYER CONTOUR MONITOR) W/DEVICE KIT Use to check blood sugar as instructed up to 4 times a day 1 kit 0  .  clopidogrel (PLAVIX) 75 MG tablet TAKE 1 TABLET BY MOUTH DAILY 90 tablet 5  . CONTOUR NEXT TEST test strip TEST BLOOD SUGAR FOUR TIMES DAILY BEFORE MEALS AND AT BEDTIME 100 each 5  . ibuprofen (ADVIL,MOTRIN) 200 MG tablet Take 600 mg by mouth 3 (three) times daily as needed for headache or moderate pain.     . Insulin Glargine (LANTUS SOLOSTAR) 100 UNIT/ML Solostar Pen Inject 70 Units into the skin daily at 10 pm. Diagnosis code  E11.42 15 mL 11  . insulin lispro (HUMALOG KWIKPEN) 100 UNIT/ML KiwkPen Inject 0.26 mLs (26 Units total) into the skin 3 (three) times daily with meals. 30 mL 11  . Insulin Pen Needle (BD PEN NEEDLE NANO U/F) 32G X 4 MM MISC USE AS DIRECTED FOUR TIMES DAILY. Dx code  E11.42 100 each 5  . Lancet Devices (BAYER MICROLET 2 LANCING DEVIC) MISC Use to check blood sugar up to 3 ties a day 1 each 2  . lisinopril (PRINIVIL,ZESTRIL) 40 MG tablet TAKE 1 TABLET BY MOUTH DAILY 90 tablet 1  . omeprazole (PRILOSEC) 40 MG capsule TAKE ONE CAPSULE BY MOUTH EVERY DAY 90 capsule 3  . oxyCODONE-acetaminophen (PERCOCET/ROXICET) 5-325 MG tablet Take 1 tablet by mouth every 6 (six) hours as needed. 6 tablet 0  . pioglitazone (ACTOS) 30 MG tablet TAKE 1 TABLET BY MOUTH EVERY DAY 90 tablet 1  . VICTOZA 18 MG/3ML SOPN INJECT 1.8MG UNDER THE SKIN EVERY MORNING 9 mL 0  . zidovudine (RETROVIR) 300 MG tablet TAKE 1 TABLET(300 MG) BY MOUTH TWICE DAILY 60 tablet 8   No current facility-administered medications on file prior to  visit.       Physical Examination  Vitals:   09/21/17 1356  BP: 125/72  Pulse: 76  Resp: 18  SpO2: 94%  Weight: 183 lb (83 kg)  Height: '6\' 1"'  (1.854 m)   Body mass index is 24.14 kg/m.  PHYSICAL EXAMINATION: General: The patient appears his stated age.   HEENT:  No gross abnormalities, bald scalp Pulmonary: Respirations are non-labored, CTAB Abdomen: Soft and non-tender with normal bowel sounds.  Musculoskeletal: Right AKA with prosthesis in place.  Neurologic: No focal weakness or paresthesias are detected, CN 2-12 intact  Skin: There are no ulcer or rashes noted. Psychiatric: The patient has normal affect. Cardiovascular: There is a regular rate and rhythm without significant murmur appreciated.   Vascular: Vessel Right Left  Radial 2+Palpable 2+Palpable  Carotid Palpable, without bruit Palpable, without bruit  Aorta Not palpable N/A  Femoral Not Palpable 2+Palpable  Popliteal AKA Not palpable  PT AKA not Palpable  DP AKA not Palpable    DATA  Left LE Arterial Duplex (09/21/17): Patent popliteal stent. Irregular plaque in the outflow, velocities are WNL.  ABI (Date: 09/21/2017):  R: AKA  L:   ABI: 0.96 (was 0.40 on 07-23-17),   PT: bi  DP: mono  TBI: 0.50, toe pressure 62 (was 0.15)  Left ABI improved since 07-23-17     Medical Decision Making  TACARI REPASS is a 67 y.o. male who presents s/p stent placement of left popliteal artery on 08-17-17 by Dr. Trula Slade for left leg rest pain.  He is also s/pleft SFA stenting in 2010. He is s/p right AKAin 2009.   He no longer has any left calf claudication since the left popliteal stent placed in April 2019. His left foot sx's remain the same, no signs of ischemia in his left foot or leg. Significant improvement in  left ABI and TBI.   Based on his non invasive exam results, HPI, and physical exam, pt to return in 3 months for left ABI and left LE arterial duplex.Carotid duplex in October 2019.  I  advised him to notify us if he develops concerns re the circulation in his feet or legs.   Graduated walking program discussed and how to achieve. I discussed in depth with the patient the nature of atherosclerosis, and emphasized the importance of maximal medical management including strict control of blood pressure, blood glucose, and lipid levels, obtaining regular exercise, and cessation of smoking.  The patient is aware that without maximal medical management the underlying atherosclerotic disease process will progress, limiting the benefit of any interventions.   Thank you for allowing [place] to participate in this patient's care.  Clemon Chambers, RN, MSN, FNP-C [facility] [place] Office: [phone]  09/21/2017, 2:29 PM  Clinic MD: Idalia Needle

## 2017-09-25 ENCOUNTER — Other Ambulatory Visit: Payer: Self-pay | Admitting: Oncology

## 2017-09-25 DIAGNOSIS — E1142 Type 2 diabetes mellitus with diabetic polyneuropathy: Secondary | ICD-10-CM

## 2017-10-15 ENCOUNTER — Other Ambulatory Visit: Payer: Self-pay | Admitting: Internal Medicine

## 2017-10-15 ENCOUNTER — Other Ambulatory Visit: Payer: Self-pay

## 2017-10-15 DIAGNOSIS — I779 Disorder of arteries and arterioles, unspecified: Secondary | ICD-10-CM

## 2017-10-15 DIAGNOSIS — E1142 Type 2 diabetes mellitus with diabetic polyneuropathy: Secondary | ICD-10-CM

## 2017-10-15 DIAGNOSIS — IMO0002 Reserved for concepts with insufficient information to code with codable children: Secondary | ICD-10-CM

## 2017-10-15 DIAGNOSIS — E1165 Type 2 diabetes mellitus with hyperglycemia: Principal | ICD-10-CM

## 2017-10-25 ENCOUNTER — Ambulatory Visit: Payer: Medicare Other | Admitting: Dietician

## 2017-10-25 ENCOUNTER — Other Ambulatory Visit: Payer: Self-pay

## 2017-10-25 ENCOUNTER — Ambulatory Visit (INDEPENDENT_AMBULATORY_CARE_PROVIDER_SITE_OTHER): Payer: Medicare Other | Admitting: Internal Medicine

## 2017-10-25 ENCOUNTER — Encounter: Payer: Self-pay | Admitting: Internal Medicine

## 2017-10-25 VITALS — BP 132/63 | HR 83 | Temp 98.1°F | Ht 73.0 in | Wt 187.1 lb

## 2017-10-25 DIAGNOSIS — Z7902 Long term (current) use of antithrombotics/antiplatelets: Secondary | ICD-10-CM

## 2017-10-25 DIAGNOSIS — E1151 Type 2 diabetes mellitus with diabetic peripheral angiopathy without gangrene: Secondary | ICD-10-CM

## 2017-10-25 DIAGNOSIS — Z794 Long term (current) use of insulin: Secondary | ICD-10-CM | POA: Diagnosis not present

## 2017-10-25 DIAGNOSIS — Z79899 Other long term (current) drug therapy: Secondary | ICD-10-CM | POA: Diagnosis not present

## 2017-10-25 DIAGNOSIS — Z89511 Acquired absence of right leg below knee: Secondary | ICD-10-CM

## 2017-10-25 DIAGNOSIS — Z87891 Personal history of nicotine dependence: Secondary | ICD-10-CM

## 2017-10-25 DIAGNOSIS — E1142 Type 2 diabetes mellitus with diabetic polyneuropathy: Secondary | ICD-10-CM | POA: Diagnosis not present

## 2017-10-25 DIAGNOSIS — I10 Essential (primary) hypertension: Secondary | ICD-10-CM

## 2017-10-25 DIAGNOSIS — I739 Peripheral vascular disease, unspecified: Secondary | ICD-10-CM

## 2017-10-25 DIAGNOSIS — Z95820 Peripheral vascular angioplasty status with implants and grafts: Secondary | ICD-10-CM | POA: Diagnosis not present

## 2017-10-25 DIAGNOSIS — E119 Type 2 diabetes mellitus without complications: Secondary | ICD-10-CM

## 2017-10-25 DIAGNOSIS — Z0000 Encounter for general adult medical examination without abnormal findings: Secondary | ICD-10-CM

## 2017-10-25 LAB — POCT GLYCOSYLATED HEMOGLOBIN (HGB A1C): Hemoglobin A1C: 6 % — AB (ref 4.0–5.6)

## 2017-10-25 LAB — HM DIABETES EYE EXAM

## 2017-10-25 LAB — GLUCOSE, CAPILLARY: Glucose-Capillary: 166 mg/dL — ABNORMAL HIGH (ref 65–99)

## 2017-10-25 NOTE — Assessment & Plan Note (Signed)
Follows with vascular surgery.  He is s/p stent placement of left popliteal artery on 08-17-17 by Dr. Trula Slade for left leg rest pain and has done well since that time with improved ABI and TBI at recent follow up.  He still has peripheral neuropathy type pain in his feet that has previously not responded well to gabapentin or lyrica.  Currently he is able to tolerate his symptoms.  Plan: - Continued VVS follow up - On plavix and statin.  DM well controlled.  Former smoker.

## 2017-10-25 NOTE — Assessment & Plan Note (Signed)
He is due for colonoscopy recall and was given the contact information to his GI office to make this appointment.

## 2017-10-25 NOTE — Assessment & Plan Note (Signed)
BP Readings from Last 3 Encounters:  10/25/17 132/63  09/21/17 125/72  08/17/17 138/65   BP well controlled on monotherapy with lisinopril 40mg  daily.  Plan: - Continue lisinopril 40mg  daily - Creatinine and electrolytes were stable in Nov 2018.  No need to recheck today given no changes to meds or doses.

## 2017-10-25 NOTE — Progress Notes (Signed)
Medicine attending: Medical history, presenting problems, physical findings, and medications, reviewed with resident physician Dr Andrew Wallace on the day of the patient visit and I concur with his evaluation and management plan. 

## 2017-10-25 NOTE — Progress Notes (Signed)
CC: here for DM and HTN f/u  HPI:  [name] is a 67 y.o. man with a past medical history listed below here today for follow up of his DM and HTN.   For details of today's visit and the status of his chronic medical issues please refer to the assessment and plan.   Past Medical History:  Diagnosis Date  . Asthma    per 2003 [facility] pulm records pfts   . Blood dyscrasia    HIV  . Clotting disorder ([place])   . Colon polyps    noted previous colonoscopy [facility]  . DDD (degenerative disc disease)    cervical spine  . Depression   . Diabetes mellitus    dx 2010  . Fever    unknwon origin  . GERD (gastroesophageal reflux disease)   . Head swelling 05/23/2017  . Hep C w/o coma, chronic ([place])   . History of syphilis    noted [facility] records  . HIV infection ([place])    undetectable viral load and CD4 ct 667 as of 11/2011  . Hyperlipidemia   . Hypertension   . MRSA (methicillin resistant Staphylococcus aureus)   . Multiple sclerosis ([place])    in remission as of 11/2011 (diagnosed late 1980s)  . Pain in limb-Right Leg 10/13/2013  . 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[place])    2002  . Pneumonia   . PVD (peripheral vascular disease) ([place])    Left Stent 07/02/2008  . Scalp lesion 07/26/2017  . UTI (urinary tract infection)   . Wears dentures    Review of Systems:  Please see pertinent ROS reviewed in HPI and problem based charting.   Physical Exam:  Vitals:   10/25/17 1512  BP: 132/63  Pulse: 83  Temp: 98.1 F (36.7 C)  TempSrc: Oral  SpO2: 98%  Weight: 187 lb 1.6 oz (84.9 kg)  Height: 6\' 1"  (1.854 m)   Physical Exam  Constitutional: He is oriented to person, place, and time and well-developed, well-nourished, and in no distress.  HENT:  Head: Normocephalic and atraumatic.  Cardiovascular: Normal rate and regular rhythm.  Pulmonary/Chest: Effort normal and breath sounds normal.  Abdominal: Soft. There is no tenderness. There is no rebound.  Musculoskeletal:  S/p  right BKA  Neurological: He is alert and oriented to person, place, and time.  Skin: Skin is warm and dry.  Psychiatric: Mood and affect normal.     Assessment & Plan:   See Encounters Tab for problem based charting.  Patient discussed with Dr. Beryle Beams.   PERIPHERAL VASCULAR DISEASE Follows with vascular surgery.  He is s/p stent placement of left popliteal artery on 08-17-17 by Dr. Trula Slade for left leg rest pain and has done well since that time with improved ABI and TBI at recent follow up.  He still has peripheral neuropathy type pain in his feet that has previously not responded well to gabapentin or lyrica.  Currently he is able to tolerate his symptoms.  Plan: - Continued VVS follow up - On plavix and statin.  DM well controlled.  Former smoker.     Benign essential HTN BP Readings from Last 3 Encounters:  10/25/17 132/63  09/21/17 125/72  08/17/17 138/65   BP well controlled on monotherapy with lisinopril 40mg  daily.  Plan: - Continue lisinopril 40mg  daily - Creatinine and electrolytes were stable in Nov 2018.  No need to recheck today given no changes to meds or doses.   DM type 2 with diabetic  peripheral neuropathy (HCC) A1c today is stable at 6.0.  We had previously decreased his insulin dosing in the setting of too tightly controlled diabetes.  He has done well on the current regimen as detailed below.  Plan: - No changes made today - Continue Lantus to 70 units daily - Continue Humalog 26 units BID to TID - Continueliraglutide 1.8mg  QDay -Continue pioglitazone 30mg  Qday - RTC 3 months.  Consider further decreasing his insulin dosing and evening out his basal to bolus dosing to be 50/50  Health care maintenance He is due for colonoscopy recall and was given the contact information to his GI office to make this appointment.

## 2017-10-25 NOTE — Assessment & Plan Note (Signed)
A1c today is stable at 6.0.  We had previously decreased his insulin dosing in the setting of too tightly controlled diabetes.  He has done well on the current regimen as detailed below.  Plan: - No changes made today - Continue Lantus to 70 units daily - Continue Humalog 26 units BID to TID - Continueliraglutide 1.8mg  QDay -Continue pioglitazone 30mg  Qday - RTC 3 months.  Consider further decreasing his insulin dosing and evening out his basal to bolus dosing to be 50/50

## 2017-10-25 NOTE — Patient Instructions (Signed)
Please call the GI office at [phone] to schedule your colonoscopy appointment at your earliest convenience.

## 2017-10-26 NOTE — Progress Notes (Signed)
Retinal images done and transmitted.  Mr. Cregg was provided with a new Contour Next blood sugar meter today.  Of note, he says he forgot to mention to Dr. Juleen China that he had a blood sugar of 40 with no symptoms prior to being dizzy and feeling like he was going to black out. He says he required the assistance of another person. This event occurred between breakfast and lunch.  He says this has happened a few other times. He reports he seldom takes his lunch insulin and sometimes omits his other Humalog injections when his blood sugar is ~70 and he cannot tell much difference in his blood sugar when he does this.  Meter download reviewed and shows average of 154 -A1C for same time period is 6 ( average ~ 126) CBGs of 47 and 55 between 12-1 PM on June 8 and one other blood sugar of 77 on another day at 11 am in the past 3 months.  P: we briefly reviewed prevention, signs and symptoms of hypoglycemia,  He agreed to schedule a Diabetes Self management visit. Suggest a trial of lower dose Humalog especially with breakfast (suggest decrease to 22 units to start)  and wearing the professional CGM since his a1C and blood sugars are well controlled and A1C is indicating lower blood sugar occuring than meter download indicates. Please order dsmt and CGM using referral to Nutrition and Diabetes Services referral if you agree.  [name] Arleigh Dicola, RD 10/26/2017 10:26 AM.

## 2017-10-31 ENCOUNTER — Other Ambulatory Visit: Payer: Self-pay | Admitting: Internal Medicine

## 2017-10-31 DIAGNOSIS — E1142 Type 2 diabetes mellitus with diabetic polyneuropathy: Secondary | ICD-10-CM

## 2017-10-31 MED ORDER — INSULIN LISPRO 100 UNIT/ML (KWIKPEN): 22.0000 [IU] | PEN_INJECTOR | Freq: Three times a day (TID) | SUBCUTANEOUS | 11 refills | Status: DC

## 2017-11-02 ENCOUNTER — Encounter: Payer: Self-pay | Admitting: Dietician

## 2017-11-03 ENCOUNTER — Encounter: Payer: Self-pay | Admitting: *Deleted

## 2017-11-06 NOTE — Addendum Note (Signed)
Addended by: Truddie Crumble on: 11/06/2017 11:17 AM   Modules accepted: Orders

## 2017-11-10 ENCOUNTER — Other Ambulatory Visit: Payer: Self-pay | Admitting: Internal Medicine

## 2017-11-10 DIAGNOSIS — E1142 Type 2 diabetes mellitus with diabetic polyneuropathy: Secondary | ICD-10-CM

## 2017-11-14 ENCOUNTER — Other Ambulatory Visit: Payer: Self-pay | Admitting: Internal Medicine

## 2017-11-14 ENCOUNTER — Encounter: Payer: Self-pay | Admitting: *Deleted

## 2017-11-14 NOTE — Telephone Encounter (Signed)
Next appt scheduled 8/28 with PCP. 

## 2017-11-16 ENCOUNTER — Telehealth: Payer: Self-pay | Admitting: Dietician

## 2017-11-16 NOTE — Telephone Encounter (Signed)
Spoke with Nathan [name] about an appointment on Monday for Continuous glucose monitoring I also informed him of Dr. Alcario Drought order to decrease his insulin to 22 units three times a day. Nathan. Lohse verbalized understanding to his new dose and asked if she should take his insulin no matter what his premeal blood sugar is. He usually holds it if his blood sugar is lower. For example, today his blood sugar was 87 before lunch so he did not take his lunch injection of Humalog. I advised him to do what he usually does until we have more information from the Continuous glucose monitoring. Lab Results  Component Value Date   HGBA1C 6.0 (A) 10/25/2017   [name], RD 11/16/2017 4:15 PM.

## 2017-11-19 ENCOUNTER — Ambulatory Visit (INDEPENDENT_AMBULATORY_CARE_PROVIDER_SITE_OTHER): Payer: Medicare Other | Admitting: Dietician

## 2017-11-19 ENCOUNTER — Encounter: Payer: Self-pay | Admitting: Dietician

## 2017-11-19 DIAGNOSIS — E1142 Type 2 diabetes mellitus with diabetic polyneuropathy: Secondary | ICD-10-CM | POA: Diagnosis not present

## 2017-11-19 LAB — GLUCOSE, CAPILLARY: GLUCOSE-CAPILLARY: 157 mg/dL — AB (ref 70–99)

## 2017-11-19 NOTE — Progress Notes (Addendum)
Documentation:  Continuous glucose monitoring was ordered by Dr. Juleen China for severe low blood sugar event and patient noting he sometimes does not need to/ is afraid to take insulin before meals. Today his blood sugar was 157 midday after only eating a Nutragrain bar 1-2 hours ago and not taking his Humalog insulin. He says he often takes his breakfast Humalog, eats a banana amd drinks coffee, then eats breakfast 1-2 hours later. He reprots most low blood sugars occur before lunch or dinner.   Lab Results  Component Value Date   HGBA1C 6.0 (A) 10/25/2017   Current Outpatient Medications on File Prior to Visit  Medication Sig Dispense Refill  . atorvastatin (LIPITOR) 40 MG tablet TAKE 1 TABLET(40 MG) BY MOUTH DAILY 90 tablet 1  . bictegravir-emtricitabine-tenofovir AF (BIKTARVY) 50-200-25 MG TABS tablet Take 1 tablet by mouth daily. 30 tablet 11  . Blood Glucose Monitoring Suppl (BAYER CONTOUR MONITOR) W/DEVICE KIT Use to check blood sugar as instructed up to 4 times a day 1 kit 0  . clopidogrel (PLAVIX) 75 MG tablet TAKE 1 TABLET BY MOUTH DAILY 90 tablet 5  . CONTOUR NEXT TEST test strip USE TO TEST BLOOD SUGAR FOUR TIMES DAILY BEFORE MEALS AND AT BEDTIME 100 each 5  . ibuprofen (ADVIL,MOTRIN) 200 MG tablet Take 600 mg by mouth 3 (three) times daily as needed for headache or moderate pain.     . Insulin Glargine (LANTUS SOLOSTAR) 100 UNIT/ML Solostar Pen Inject 70 Units into the skin daily at 10 pm. Diagnosis code  E11.42 15 mL 11  . insulin lispro (HUMALOG KWIKPEN) 100 UNIT/ML KiwkPen Inject 0.22 mLs (22 Units total) into the skin 3 (three) times daily with meals. 30 mL 11  . Insulin Pen Needle (BD PEN NEEDLE NANO U/F) 32G X 4 MM MISC USE AS DIRECTED FOUR TIMES DAILY. Dx code  E11.42 100 each 5  . Lancet Devices (BAYER MICROLET 2 LANCING DEVIC) MISC Use to check blood sugar up to 3 ties a day 1 each 2  . lisinopril (PRINIVIL,ZESTRIL) 40 MG tablet TAKE 1 TABLET BY MOUTH DAILY 90 tablet 0  .  omeprazole (PRILOSEC) 40 MG capsule TAKE ONE CAPSULE BY MOUTH EVERY DAY 90 capsule 3  . pioglitazone (ACTOS) 30 MG tablet TAKE 1 TABLET BY MOUTH EVERY DAY 90 tablet 0  . VICTOZA 18 MG/3ML SOPN INJECT 1.8 MG UNDER THE SKIN EVERY MORNING 9 mL 3  . zidovudine (RETROVIR) 300 MG tablet TAKE 1 TABLET(300 MG) BY MOUTH TWICE DAILY 60 tablet 8   No current facility-administered medications on file prior to visit.      Freestyle Libre Pro CGM sensor placed and started. Patient was educated about wearing sensor, keeping food, activity and medication log and when to call office. Follow up was arranged with the patient. [name], RD 11/19/2017 4:54 PM.

## 2017-11-20 NOTE — Telephone Encounter (Signed)
Agree! Thanks, [name]!

## 2017-11-26 ENCOUNTER — Telehealth: Payer: Self-pay | Admitting: Dietician

## 2017-11-26 ENCOUNTER — Ambulatory Visit: Payer: Self-pay

## 2017-11-26 ENCOUNTER — Encounter: Payer: Self-pay | Admitting: Dietician

## 2017-11-26 ENCOUNTER — Ambulatory Visit (INDEPENDENT_AMBULATORY_CARE_PROVIDER_SITE_OTHER): Payer: Medicare Other | Admitting: Dietician

## 2017-11-26 DIAGNOSIS — Z794 Long term (current) use of insulin: Secondary | ICD-10-CM | POA: Diagnosis not present

## 2017-11-26 DIAGNOSIS — Z713 Dietary counseling and surveillance: Secondary | ICD-10-CM

## 2017-11-26 DIAGNOSIS — Z6824 Body mass index (BMI) 24.0-24.9, adult: Secondary | ICD-10-CM

## 2017-11-26 DIAGNOSIS — E1142 Type 2 diabetes mellitus with diabetic polyneuropathy: Secondary | ICD-10-CM | POA: Diagnosis not present

## 2017-11-26 NOTE — Progress Notes (Signed)
  Medical Nutrition Therapy:  Appt start time: 6270 end time:  1555. Visit # 1  Assessment:  Primary concerns today: blood sugar control.  Nathan [name] presents for assistance with his blood sugars, he had a severe low blood sugar event midday that scared him. He is not sure if he should take his premeal insulin dose or not. He says that it doesn't seem to make a difference to his next blood sugar whether he takes his premeal insulin or doesn't. Continuous glucose monitoring was ordered for him. Unfortunately, the sensor fell off last Wednesday  after he was sweaty from mowing the lawn on his 3rd day of wearing it and no data was recorded at all. He stopped keeping records when it fell off. A new sensor was placed today.  Preferred Learning Style:  No preference indicated  Learning Readiness:Ready  ANTHROPOMETRICS: Estimated body mass index is 24.68 kg/m as calculated from the following:   Height as of 10/25/17: 6\' 1"  (1.854 m).   Weight as of 10/25/17: 187 lb 1.6 oz (84.9 kg).  WEIGHT HISTORY:  Wt Readings from Last 5 Encounters:  10/25/17 187 lb 1.6 oz (84.9 kg)  09/21/17 183 lb (83 kg)  08/17/17 183 lb 8 oz (83.2 kg)  08/15/17 183 lb 8 oz (83.2 kg)  08/07/17 185 lb (83.9 kg)   MEDICATIONS: lantus 70 units daily and 22 units Humalog 2-3 times a day before meals BLOOD SUGAR:meter download shows: average 155, range 87-243, 80% in target. 20% above target, 0% below target  Lab Results  Component Value Date   HGBA1C 6.0 (A) 10/25/2017    DIETARY INTAKE: He [place] [employer]. Snacks on fruit, more details next week  Usual physical activity: very active most days  Progress Towards Goal(s):  In progress.   Nutritional Diagnosis:  NB-1.4 Self-monitoring deficit As related to need for post prandial and nocturnal glucsoe readings to prevent hypoglycemia.  As evidenced by his report of severe hypoglycemia.    Intervention:  Nutrition education about Continuous glucose  monitoring Coordination of care:  none  Teaching Method Utilized: Visual,Auditory, Hands on Handouts given during visit include:cgm folder and food  Barriers to learning/adherence to lifestyle change: none Demonstrated degree of understanding via:  Teach Back   Monitoring/Evaluation:  Dietary intake, exercise, food and medication list, and body weight in 1 week(s). [name], RD 11/26/2017 4:35 PM.

## 2017-11-26 NOTE — Telephone Encounter (Signed)
[name] says his CGM sensor fell off  Wednesday and wants to know if he should keep his appointments for Monday. I assured him that he should keep it with me and to bring the sensor when he comes.  I will ask the doctors if he should reschedule with them for next week.

## 2017-11-26 NOTE — Patient Instructions (Signed)
Follow up 1 week. Keep records as you were doing.

## 2017-12-03 ENCOUNTER — Encounter: Payer: Self-pay | Admitting: Dietician

## 2017-12-03 ENCOUNTER — Encounter: Payer: Self-pay | Admitting: Internal Medicine

## 2017-12-03 ENCOUNTER — Other Ambulatory Visit: Payer: Self-pay

## 2017-12-03 ENCOUNTER — Ambulatory Visit (INDEPENDENT_AMBULATORY_CARE_PROVIDER_SITE_OTHER): Payer: Medicare Other | Admitting: Internal Medicine

## 2017-12-03 ENCOUNTER — Ambulatory Visit (INDEPENDENT_AMBULATORY_CARE_PROVIDER_SITE_OTHER): Payer: Medicare Other | Admitting: Dietician

## 2017-12-03 VITALS — BP 136/64 | HR 80 | Temp 98.2°F | Ht 73.0 in | Wt 187.1 lb

## 2017-12-03 DIAGNOSIS — G35 Multiple sclerosis: Secondary | ICD-10-CM

## 2017-12-03 DIAGNOSIS — E1142 Type 2 diabetes mellitus with diabetic polyneuropathy: Secondary | ICD-10-CM

## 2017-12-03 DIAGNOSIS — Z794 Long term (current) use of insulin: Secondary | ICD-10-CM

## 2017-12-03 DIAGNOSIS — Z7902 Long term (current) use of antithrombotics/antiplatelets: Secondary | ICD-10-CM | POA: Diagnosis not present

## 2017-12-03 DIAGNOSIS — Z21 Asymptomatic human immunodeficiency virus [HIV] infection status: Secondary | ICD-10-CM

## 2017-12-03 DIAGNOSIS — Z713 Dietary counseling and surveillance: Secondary | ICD-10-CM | POA: Diagnosis not present

## 2017-12-03 DIAGNOSIS — R531 Weakness: Secondary | ICD-10-CM | POA: Diagnosis not present

## 2017-12-03 DIAGNOSIS — Z6824 Body mass index (BMI) 24.0-24.9, adult: Secondary | ICD-10-CM

## 2017-12-03 DIAGNOSIS — R42 Dizziness and giddiness: Secondary | ICD-10-CM | POA: Diagnosis not present

## 2017-12-03 DIAGNOSIS — B1910 Unspecified viral hepatitis B without hepatic coma: Secondary | ICD-10-CM

## 2017-12-03 DIAGNOSIS — E1151 Type 2 diabetes mellitus with diabetic peripheral angiopathy without gangrene: Secondary | ICD-10-CM

## 2017-12-03 DIAGNOSIS — E785 Hyperlipidemia, unspecified: Secondary | ICD-10-CM | POA: Diagnosis not present

## 2017-12-03 DIAGNOSIS — Z87891 Personal history of nicotine dependence: Secondary | ICD-10-CM | POA: Diagnosis not present

## 2017-12-03 DIAGNOSIS — I10 Essential (primary) hypertension: Secondary | ICD-10-CM | POA: Diagnosis not present

## 2017-12-03 NOTE — Progress Notes (Signed)
   CC: CGM monitoring  HPI:  Nathan [name] is a 67 y.o. male with a past medical history of HTN, Type 2 DM, PVD, MS in remission, hyperlipidemia, HIV and Hepatitis B presenting for CGM monitoring.    Marlou Starks Pla wore the CGM for 8 days. The average reading was 158, % time in target was 72, % time below target was 0, and % time above target was. 28. Intervention will be to decrease morning time Humalog to 18 units 15-20 minutes later than usual, and to take Humalog with lunch. The patient will be scheduled to see in one week for a final appointment.     Past Medical History:  Diagnosis Date  . Asthma    per 2003 [facility] pulm records pfts   . Blood dyscrasia    HIV  . Clotting disorder ([place])   . Colon polyps    noted previous colonoscopy [facility]  . DDD (degenerative disc disease)    cervical spine  . Depression   . Diabetes mellitus    dx 2010  . Fever    unknwon origin  . GERD (gastroesophageal reflux disease)   . Head swelling 05/23/2017  . Hep C w/o coma, chronic ([place])   . History of syphilis    noted [facility] records  . HIV infection ([place])    undetectable viral load and CD4 ct 667 as of 11/2011  . Hyperlipidemia   . Hypertension   . MRSA (methicillin resistant Staphylococcus aureus)   . Multiple sclerosis ([place])    in remission as of 11/2011 (diagnosed late 1980s)  . Pain in limb-Right Leg 10/13/2013  . 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 Pneumonia   . PVD (peripheral vascular disease) ([place])    Left Stent 07/02/2008  . Scalp lesion 07/26/2017  . UTI (urinary tract infection)   . Wears dentures    Review of Systems:    Review of Systems  Constitutional: Negative.   Genitourinary: Negative.   Neurological: Positive for dizziness and weakness. Negative for headaches.    Physical Exam:  Vitals:   12/03/17 1600  BP: 136/64  Pulse: 80  Temp: 98.2 F (36.8 C)  TempSrc: Oral  SpO2: 97%  Weight: 187 lb 1.6 oz (84.9 kg)  Height: 6\' 1"  (1.854  m)    Physical Exam  Constitutional: He is oriented to person, place, and time and well-developed, well-nourished, and in no distress.  Cardiovascular: Normal rate, regular rhythm and normal heart sounds.  Pulmonary/Chest: Effort normal and breath sounds normal.  Neurological: He is alert and oriented to person, place, and time.    Assessment & Plan:   See Encounters Tab for problem based charting.  Patient seen with Dr. Daryll Drown     DM type 2 with diabetic peripheral neuropathy ([place]) A1c from 10/25/17 was at 6.0.  We had previously decreased his insulin dosing in the setting of too tightly controlled diabetes.  He has done well on the current regimen as detailed below.   Plan: - Continue Lantus to 70 units daily - Continue Humalog 22 units TID (18 units at breakfast 15-20 minutes ) -Continueliraglutide 1.8mg  QDay -Continue pioglitazone 30mg  Qday - RTC 1 week.  Consider Fiasp instead of Humalog.

## 2017-12-03 NOTE — Patient Instructions (Addendum)
Please return in 7 days with Gratis for Download #2.   Continue to take medications as planned with the following adjustments:  18 Units of Humalog at breakfast 15-20 minutes later than normally taking it.Take the Humalog with lunch as well.

## 2017-12-03 NOTE — Assessment & Plan Note (Addendum)
Nathan [name] is here to follow up for his CGM monitor. He noticed his sugars to be low only one day where he felt lightheaded and dizzy right around lunch time.  He wore the CGM for 8 days. The average reading was 158, % time in target was 72, % time below target was 0, and % time above target was. 28. Intervention will be to decrease morning time Humalog to 18 units 15-20 minutes later than usual, and to take Humalog with lunch.    A1c from 10/25/17 was at 6.0.  We had previously decreased his insulin dosing in the setting of too tightly controlled diabetes.  He has done well on the current regimen as detailed below.   Plan: - Continue Lantus to 70 units daily - Continue Humalog 22 units TID (18 units at breakfast) -Continueliraglutide 1.8mg  QDay -Continue pioglitazone 30mg  Qday - RTC 1 week.  Consider [employer]

## 2017-12-04 NOTE — Progress Notes (Signed)
  Medical Nutrition Therapy:  Appt start time: 6283 end time:  6629. Visit # 2  Assessment:  Primary concerns today: blood sugar control.  Nathan [name] presents for assistance with his blood sugars, he had one symptomatic low blood sugar over the past week. The new sensor looks good. He saw on the CGM download that he is okay and should be taking his mealtime insulin in the afternoon with his largest meal, that the timing of his insulin is important and that he should take his mealtime insulin before he eats  ANTHROPOMETRICS: Estimated body mass index is 24.68 kg/m as calculated from the following:   Height as of an earlier encounter on 12/03/17: 6\' 1"  (1.854 m).   Weight as of an earlier encounter on 12/03/17: 187 lb 1.6 oz (84.9 kg).  WEIGHT HISTORY:  Wt Readings from Last 5 Encounters:  12/03/17 187 lb 1.6 oz (84.9 kg)  10/25/17 187 lb 1.6 oz (84.9 kg)  09/21/17 183 lb (83 kg)  08/17/17 183 lb 8 oz (83.2 kg)  08/15/17 183 lb 8 oz (83.2 kg)   MEDICATIONS: lantus 70 units daily and 22 units Humalog 2-3 times a day before meals BLOOD SUGAR:meter download shows: average 152, range 83-243, 80% in target. 20% above target, 0% below target  Lab Results  Component Value Date   HGBA1C 6.0 (A) 10/25/2017    DIETARY INTAKE: Breakfast- coffee with cream & banana Lunch- He eats out daily at [employer] most weekdays around 1 Pm. Snacks on fruit on [employer]- 5-9,but mostly about 7-8 PM at home grilled cheese and 1 -2 beers once a week,  He drinks water and an occasional diet pepsi. 1-2 beers/week Usual physical activity: very active most days  Progress Towards Goal(s):  In progress.   Nutritional Diagnosis:  NB-1.4 Self-monitoring deficit As related to need for post prandial and nocturnal glucose readings to prevent hypoglycemia is improving with Continuous glucose monitoring  As evidenced by his report of realizing/seeing the effect of his food/activity/insulin timing/amount on  Continuous glucose monitoring records.    Intervention:  Nutrition education about Continuous glucose monitoring download, insuk,in action/timing/ diabetes and alcohol. Options for decreasing risk of hypoglycemia Coordination of care:  Consulted with Dr. Laural Golden about [name]'s care  Teaching Method Utilized: Visual,Auditory, Hands on Handouts given during visit include:cgm folder and food  Barriers to learning/adherence to lifestyle change: none Demonstrated degree of understanding via:  Teach Back   Monitoring/Evaluation:  Dietary intake, exercise, food and medication list, and body weight in 1 week(s). Nathan [name], RD 12/04/2017 8:19 AM.

## 2017-12-06 ENCOUNTER — Telehealth: Payer: Self-pay | Admitting: Dietician

## 2017-12-06 ENCOUNTER — Other Ambulatory Visit: Payer: Self-pay | Admitting: *Deleted

## 2017-12-06 DIAGNOSIS — E1142 Type 2 diabetes mellitus with diabetic polyneuropathy: Secondary | ICD-10-CM

## 2017-12-06 NOTE — Telephone Encounter (Signed)
Last Humalog rx was 6/26 but "No Print" . Please re-send  Thanks

## 2017-12-06 NOTE — Telephone Encounter (Signed)
Nathan [name] was concerned about he CGM sensor staying on when he was here. I called to follow up and he said it is doing well. No other needs identified at this time.

## 2017-12-07 MED ORDER — INSULIN LISPRO 100 UNIT/ML (KWIKPEN): 22.0000 [IU] | PEN_INJECTOR | Freq: Three times a day (TID) | SUBCUTANEOUS | 11 refills | Status: DC

## 2017-12-09 NOTE — Progress Notes (Signed)
Internal Medicine Clinic Attending  I saw and evaluated the patient.  I personally confirmed the key portions of the history and exam documented by Dr.  Rehman  and I reviewed pertinent patient test results.  The assessment, diagnosis, and plan were formulated together and I agree with the documentation in the resident's note.  

## 2017-12-10 ENCOUNTER — Ambulatory Visit (INDEPENDENT_AMBULATORY_CARE_PROVIDER_SITE_OTHER): Payer: Medicare Other | Admitting: Internal Medicine

## 2017-12-10 ENCOUNTER — Encounter: Payer: Self-pay | Admitting: Dietician

## 2017-12-10 ENCOUNTER — Ambulatory Visit (INDEPENDENT_AMBULATORY_CARE_PROVIDER_SITE_OTHER): Payer: Medicare Other | Admitting: Dietician

## 2017-12-10 ENCOUNTER — Other Ambulatory Visit: Payer: Self-pay

## 2017-12-10 ENCOUNTER — Encounter: Payer: Self-pay | Admitting: Internal Medicine

## 2017-12-10 VITALS — BP 147/57 | HR 85 | Temp 99.2°F | Ht 73.0 in | Wt 187.2 lb

## 2017-12-10 DIAGNOSIS — E1142 Type 2 diabetes mellitus with diabetic polyneuropathy: Secondary | ICD-10-CM

## 2017-12-10 DIAGNOSIS — Z87891 Personal history of nicotine dependence: Secondary | ICD-10-CM

## 2017-12-10 DIAGNOSIS — Z6824 Body mass index (BMI) 24.0-24.9, adult: Secondary | ICD-10-CM | POA: Diagnosis not present

## 2017-12-10 DIAGNOSIS — Z794 Long term (current) use of insulin: Secondary | ICD-10-CM | POA: Diagnosis not present

## 2017-12-10 DIAGNOSIS — Z7902 Long term (current) use of antithrombotics/antiplatelets: Secondary | ICD-10-CM | POA: Diagnosis not present

## 2017-12-10 DIAGNOSIS — Z713 Dietary counseling and surveillance: Secondary | ICD-10-CM | POA: Diagnosis not present

## 2017-12-10 NOTE — Assessment & Plan Note (Signed)
Type 2 DM, controlled: Patient presents for second download of CGM. During his first visit, it was noted he had an episode of true hypoglycemia with BG 40 and his breakfast Humalog was decreased 22-> 18 units. His regimen includes: Lantus 70U QHS, Humalog 18/22/22 units TID with meals, Victoza, and pioglitazone. A1c 6.0 in 10/2017. Unfortunately, data is only of 4 days as his monitor fell off.  Few hypoglycemic events at dinner time noted between 70-80, though asymptomatic and not documented in plotted graph. Suspect these are not true hypoglycemic events. Otherwise, no hypoglycemia in AM. Lunchtime BG improving though remains above goal. He reports eating dessert with lunch and dinner most days of the week. We discussed dietary modifications, especially avoiding dessert and sodas at lunch time. Otherwise, will continue current regimen and follow up in 3 months.  - Continue current regimen  - Follow up in 3 months

## 2017-12-10 NOTE — Progress Notes (Signed)
   CC: DM follow up   HPI:  [name] is a 67 y.o. male with PMH listed below who presents to clinic for diabetes follow-up.  Type 2 DM, controlled: Patient presents for second download of CGM. During his first visit, it was noted he had an episode of true hypoglycemia with BG 40 and his breakfast Humalog was decreased 22-> 18 units. His regimen includes: Lantus 70U QHS, Humalog 18/22/22 units TID with meals, Victoza, and pioglitazone. A1c 6.0 in 10/2017. Unfortunately, data is only of 4 days as his monitor fell off.  Few hypoglycemic events at dinner time noted between 70-80, though asymptomatic and not documented in plotted graph. Suspect these are not true hypoglycemic events. Otherwise, no hypoglycemia in AM. Lunchtime BG improving though remains above goal. He reports eating dessert with lunch and dinner most days of the week. We discussed dietary modifications, especially avoiding dessert and sodas at lunch time. Otherwise, will continue current regimen and follow up in 3 months.  - Continue current regimen  - Follow up in 3 months   Past Medical History:  Diagnosis Date  . Asthma    per 2003 [facility] pulm records pfts   . Blood dyscrasia    HIV  . Clotting disorder ([place])   . Colon polyps    noted previous colonoscopy [facility]  . DDD (degenerative disc disease)    cervical spine  . Depression   . Diabetes mellitus    dx 2010  . Fever    unknwon origin  . GERD (gastroesophageal reflux disease)   . Head swelling 05/23/2017  . Hep C w/o coma, chronic ([place])   . History of syphilis    noted [facility] records  . HIV infection ([place])    undetectable viral load and CD4 ct 667 as of 11/2011  . Hyperlipidemia   . Hypertension   . MRSA (methicillin resistant Staphylococcus aureus)   . Multiple sclerosis ([place])    in remission as of 11/2011 (diagnosed late 1980s)  . Pain in limb-Right Leg 10/13/2013  . 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 Pneumonia   . PVD (peripheral  vascular disease) ([place])    Left Stent 07/02/2008  . Scalp lesion 07/26/2017  . UTI (urinary tract infection)   . Wears dentures    Review of Systems:   Review of Systems  Constitutional: Negative for chills, fever and weight loss.  Genitourinary: Negative for frequency and urgency.  Neurological: Negative for dizziness and headaches.   Physical Exam:  Vitals:   12/10/17 1559  BP: (!) 147/57  Pulse: 85  Temp: 99.2 F (37.3 C)  TempSrc: Oral  SpO2: 95%  Weight: 187 lb 3.2 oz (84.9 kg)  Height: 6\' 1"  (1.854 m)   General: Elderly male, well-developed, well-nourished, no acute distress CV: RRR, no mrg  Pulm: CTAB, no increased work of breathing on room air  Assessment & Plan:   See Encounters Tab for problem based charting.  Patient discussed with Dr. Evette Doffing

## 2017-12-10 NOTE — Patient Instructions (Addendum)
Please follow up in 1 year or sooner if needed.   You can wear the Continuous glucose monitor again every 3 months. (again in November 2019)  Please rotate injection sites so that you do not inject the same place more than 1x every 30 days.   Do not inject in areas of skin that are lumpy or tough.   Please pinch up skin before injecting the insulin. You may also angle the needle to keep it from hitting muscle.   You do a good job eating your fruits and veggies- keep it up!  Call anytime!  [name] [phone]

## 2017-12-10 NOTE — Progress Notes (Signed)
  Medical Nutrition Therapy:  Appt start time: 9417 end time:  4081. Visit # 3  Assessment:  Primary concerns today: blood sugar control.  Nathan [name] presents for assistance with his blood sugars, he had no low blood sugars over the past week. The new sensor fell off after 4 days after mowing the  Lawn and sweating. We reviewed the cgm download and possible reasons for a few of the high blood sugars post breakfast.   ANTHROPOMETRICS: Estimated body mass index is 24.7 kg/m as calculated from the following:   Height as of an earlier encounter on 12/10/17: 6\' 1"  (1.854 m).   Weight as of an earlier encounter on 12/10/17: 187 lb 3.2 oz (84.9 kg).  WEIGHT HISTORY:  Wt Readings from Last 5 Encounters:  12/10/17 187 lb 3.2 oz (84.9 kg)  12/03/17 187 lb 1.6 oz (84.9 kg)  10/25/17 187 lb 1.6 oz (84.9 kg)  09/21/17 183 lb (83 kg)  08/17/17 183 lb 8 oz (83.2 kg)   MEDICATIONS: pioglitazone, victoza 1.8mg  daily,  lantus 70 units daily in evening, 18 units Humalog ~ 15 minutes before breakfast,  22 units Humalog before lunch and dinner BLOOD SUGAR:meter download shows: improved blood sugars since CGM inception. CGM download average 153 ( decreased from 158) 22% above target, 78% in target. 0% below target  DIETARY INTAKE: Breakfast-8-9 AM  coffee with cream & banana or grapes Lunch- 12-1 PM -out daily most weekdays- beef stew, hush puppies Dinner- 5-9,but mostly about 7-8 PM at home, sandwich  He drinks water and an occasional diet pepsi. 1-2 beers/week Usual physical activity: very active most days  Progress Towards Goal(s):  In progress.   Nutritional Diagnosis:  NB-1.4 Self-monitoring deficit As related to need for post prandial and nocturnal glucose readings to prevent hypoglycemia is resolved with results of Continuous glucose monitoring  As evidenced by his report of realizing/seeing the effect of his food/activity/insulin timing/amount on Continuous glucose monitoring records.     Intervention:  Nutrition education about Continuous glucose monitoring download, insulin site rotation. Coordination of care:  Consulted with Dr. Isac Sarna about [name]'s care Teaching Method Utilized: Visual,Auditory, Hands on Handouts given during visit include:cgm folder and food  Barriers to learning/adherence to lifestyle change: none Demonstrated degree of understanding via:  Teach Back   Monitoring/Evaluation:  Dietary intake, exercise, food and medication list, and body weight in 1 year(s). Nathan [name], RD 12/10/2017 4:57 PM.

## 2017-12-10 NOTE — Patient Instructions (Signed)
Nathan [name],   Continue taking your insulin as usual. Please limit the amount of sugar during lunch time. I will see you back in 3 months for a DM follow up.   Please call us if you have any questions or concerns.   - Dr. Frederico Hamman

## 2017-12-11 NOTE — Progress Notes (Signed)
Internal Medicine Clinic Attending  Case discussed with Dr. Santos-Sanchez at the time of the visit.  We reviewed the resident's history and exam and pertinent patient test results.  I agree with the assessment, diagnosis, and plan of care documented in the resident's note.    

## 2017-12-13 ENCOUNTER — Encounter: Payer: Self-pay | Admitting: Infectious Disease

## 2017-12-14 ENCOUNTER — Other Ambulatory Visit: Payer: Self-pay | Admitting: Internal Medicine

## 2017-12-14 ENCOUNTER — Other Ambulatory Visit: Payer: Self-pay

## 2017-12-14 ENCOUNTER — Ambulatory Visit (INDEPENDENT_AMBULATORY_CARE_PROVIDER_SITE_OTHER)
Admission: RE | Admit: 2017-12-14 | Discharge: 2017-12-14 | Disposition: A | Discharge status: Home / Self Care | Payer: Medicare Other | Source: Ambulatory Visit | Attending: Vascular Surgery | Admitting: Vascular Surgery

## 2017-12-14 ENCOUNTER — Encounter: Payer: Self-pay | Admitting: Family

## 2017-12-14 ENCOUNTER — Ambulatory Visit (HOSPITAL_COMMUNITY)
Admission: RE | Admit: 2017-12-14 | Discharge: 2017-12-14 | Disposition: A | Discharge status: Home / Self Care | Payer: Medicare Other | Source: Ambulatory Visit | Attending: Vascular Surgery | Admitting: Vascular Surgery

## 2017-12-14 ENCOUNTER — Ambulatory Visit (INDEPENDENT_AMBULATORY_CARE_PROVIDER_SITE_OTHER): Payer: Medicare Other | Admitting: Family

## 2017-12-14 VITALS — BP 119/70 | HR 73 | Temp 98.4°F | Resp 18 | Ht 73.0 in | Wt 182.0 lb

## 2017-12-14 DIAGNOSIS — E785 Hyperlipidemia, unspecified: Secondary | ICD-10-CM | POA: Diagnosis not present

## 2017-12-14 DIAGNOSIS — Z89611 Acquired absence of right leg above knee: Secondary | ICD-10-CM

## 2017-12-14 DIAGNOSIS — E7800 Pure hypercholesterolemia, unspecified: Secondary | ICD-10-CM

## 2017-12-14 DIAGNOSIS — I779 Disorder of arteries and arterioles, unspecified: Secondary | ICD-10-CM | POA: Diagnosis not present

## 2017-12-14 DIAGNOSIS — E119 Type 2 diabetes mellitus without complications: Secondary | ICD-10-CM | POA: Insufficient documentation

## 2017-12-14 DIAGNOSIS — Z87891 Personal history of nicotine dependence: Secondary | ICD-10-CM | POA: Insufficient documentation

## 2017-12-14 DIAGNOSIS — I10 Essential (primary) hypertension: Secondary | ICD-10-CM | POA: Insufficient documentation

## 2017-12-14 DIAGNOSIS — E1142 Type 2 diabetes mellitus with diabetic polyneuropathy: Secondary | ICD-10-CM

## 2017-12-14 NOTE — Progress Notes (Signed)
[facility] [place]   CC: Follow up peripheral artery occlusive disease  History of Present Illness Nathan [name] is a 67 y.o. male who is s/p stent placement of left popliteal artery on 08-17-17 by Dr. Trula Slade for left leg rest pain.  He is also s/pleft SFA stenting in 2010. He is s/p right AKAin 2009.  Ptis ambulating with a right above-knee prosthesis. Heworked with [employer] to address themoderate pain he was havingat the bony prominence of the right AKA stump with walking, and had resolution of this pain. He denies any stump wounds. He has stinging and burning in foot up to left knee, attributes to neuropathy.  Pt states left calf feels much improved since the procedure on 08-17-17, but left foot feels no improvement, no wounds per pt.  He states his left great toe has had numbness intermittently over the years.   He has MS, which he states has been in remission for years.   Pt denies any history of stroke or TIA.  He was evaluated by K. Trinh, PA-C, on 07-17-16. At that time left SFA stentwas patent and ABIs stable.He continuedto have some neuropathic type symptoms in the left leg and foot. Ambulating well with right leg prosthesis. Pt advised to continue maximal medical management with diabetes control, plavix and statin. Follow up in one year with repeat ABIs and left leg arterial duplex. Carotid duplex demonstrated60-79% bilateral carotid stenosis, he hadbeen asymptomatic. His intermittent bilateral eye blurriness resolvedwith blinking and hadbeen unchanged for the priorseveral years. Didnot appear to be amaurosis fugax. Did discuss with Dr. Trula Slade that he is below the 80% threshold for surgical intervention. Pt was tofollow up in 6 months with repeat carotid duplex. The patient knows to seek emergency assistance if he develops any TIA or stroke symptoms.    Diabetic: Yes,6.0 A1C on 07-26-17 Tobacco use: former smoker, quit in  2009  Pt meds include: Statin :Yes ASA: No Other anticoagulants/antiplatelets: Plavix    Past Medical History:  Diagnosis Date  . Asthma    per 2003 [facility] pulm records pfts   . Blood dyscrasia    HIV  . Clotting disorder ([place])   . Colon polyps    noted previous colonoscopy [facility]  . DDD (degenerative disc disease)    cervical spine  . Depression   . Diabetes mellitus    dx 2010  . Fever    unknwon origin  . GERD (gastroesophageal reflux disease)   . Head swelling 05/23/2017  . Hep C w/o coma, chronic ([place])   . History of syphilis    noted [facility] records  . HIV infection ([place])    undetectable viral load and CD4 ct 667 as of 11/2011  . Hyperlipidemia   . Hypertension   . MRSA (methicillin resistant Staphylococcus aureus)   . Multiple sclerosis ([place])    in remission as of 11/2011 (diagnosed late 1980s)  . Pain in limb-Right Leg 10/13/2013  . 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 Pneumonia   . PVD (peripheral vascular disease) ([place])    Left Stent 07/02/2008  . Scalp lesion 07/26/2017  . UTI (urinary tract infection)   . Wears dentures     Social History Social History   Tobacco Use  . Smoking status: Former Smoker    Last attempt to quit: 05/09/2007    Years since quitting: 10.6  . Smokeless tobacco: Former Systems developer    Types: Chew  Substance Use Topics  . Alcohol  use: No    Alcohol/week: 0.0 standard drinks  . Drug use: No    Family History Family History  Problem Relation Age of Onset  . Diabetes Mother   . Coronary artery disease Mother   . Cancer Brother        colon caner stage 4 as of 11/2011 (unknown age of onset)  . Coronary artery disease Father   . Prostate cancer Brother     Past Surgical History:  Procedure Laterality Date  . ABDOMINAL AORTOGRAM W/LOWER EXTREMITY N/A 08/07/2017   Procedure: ABDOMINAL AORTOGRAM W/LOWER EXTREMITY;  Surgeon: Serafina Mitchell, MD;  Location: [facility];  Service: Cardiovascular;  Laterality: N/A;   . ABOVE KNEE LEG AMPUTATION     Right Leg  . COLONOSCOPY W/ BIOPSIES AND POLYPECTOMY    . LOWER EXTREMITY ANGIOGRAM Left 12/26/2011   Procedure: LOWER EXTREMITY ANGIOGRAM;  Surgeon: Serafina Mitchell, MD;  Location: [facility] CATH LAB;  Service: Cardiovascular;  Laterality: Left;  . LOWER EXTREMITY ANGIOGRAM Left 08/17/2017   Procedure: LOWER EXTREMITY ANGIOGRAM LEFT LEG WITH RUNOFF AND Stenting.;  Surgeon: Serafina Mitchell, MD;  Location: [facility];  Service: Vascular;  Laterality: Left;  Marland Kitchen MULTIPLE TOOTH EXTRACTIONS    . OTHER SURGICAL HISTORY     right lower ext AKA with prothesis   . OTHER SURGICAL HISTORY     left left with stents (Dr. Seward Speck)  . OTHER SURGICAL HISTORY     2003 colonoscopy 5 mm polyp transverse colon; (2)59m  polyps in rectum-hyperplastic  . PERIPHERAL VASCULAR INTERVENTION Left 08/17/2017   Procedure: POPLITEAL STENT;  Surgeon: BSerafina Mitchell MD;  [place]  Service: Vascular;  Laterality: Left;    Allergies  Allergen Reactions  . Sulfonamide Derivatives Hives    Current Outpatient Medications  Medication Sig Dispense Refill  . atorvastatin (LIPITOR) 40 MG tablet TAKE 1 TABLET(40 MG) BY MOUTH DAILY 90 tablet 1  . bictegravir-emtricitabine-tenofovir AF (BIKTARVY) 50-200-25 MG TABS tablet Take 1 tablet by mouth daily. 30 tablet 11  . Blood Glucose Monitoring Suppl (BAYER CONTOUR MONITOR) W/DEVICE KIT Use to check blood sugar as instructed up to 4 times a day 1 kit 0  . clopidogrel (PLAVIX) 75 MG tablet TAKE 1 TABLET BY MOUTH DAILY 90 tablet 5  . CONTOUR NEXT TEST test strip USE TO TEST BLOOD SUGAR FOUR TIMES DAILY BEFORE MEALS AND AT BEDTIME 100 each 5  . ibuprofen (ADVIL,MOTRIN) 200 MG tablet Take 600 mg by mouth 3 (three) times daily as needed for headache or moderate pain.     . Insulin Glargine (LANTUS SOLOSTAR) 100 UNIT/ML Solostar Pen Inject 70 Units into the skin daily at 10 pm. Diagnosis code  E11.42 15 mL 11  . insulin lispro (HUMALOG KWIKPEN) 100  UNIT/ML KiwkPen Inject 0.22 mLs (22 Units total) into the skin 3 (three) times daily with meals. 30 mL 11  . Insulin Pen Needle (BD PEN NEEDLE NANO U/F) 32G X 4 MM MISC USE AS DIRECTED FOUR TIMES DAILY. Dx code  E11.42 100 each 5  . Lancet Devices (BAYER MICROLET 2 LANCING DEVIC) MISC Use to check blood sugar up to 3 ties a day 1 each 2  . lisinopril (PRINIVIL,ZESTRIL) 40 MG tablet TAKE 1 TABLET BY MOUTH DAILY 90 tablet 0  . omeprazole (PRILOSEC) 40 MG capsule TAKE ONE CAPSULE BY MOUTH EVERY DAY 90 capsule 3  . pioglitazone (ACTOS) 30 MG tablet TAKE 1 TABLET BY MOUTH EVERY DAY 90 tablet 0  .  VICTOZA 18 MG/3ML SOPN INJECT 1.8 MG UNDER THE SKIN EVERY MORNING 9 mL 3  . zidovudine (RETROVIR) 300 MG tablet TAKE 1 TABLET(300 MG) BY MOUTH TWICE DAILY 60 tablet 8   No current facility-administered medications for this visit.     ROS: See HPI for pertinent positives and negatives.   Physical Examination  Vitals:   12/14/17 1532  BP: 119/70  Pulse: 73  Resp: 18  Temp: 98.4 F (36.9 C)  TempSrc: Oral  SpO2: 93%  Weight: 182 lb (82.6 kg)  Height: _0  (1.854 m)   Body mass index is 24.01 kg/m.  General: The patient appears his stated age.   HEENT:  No gross abnormalities, bald scalp Pulmonary: Respirations are non-labored, CTAB Abdomen: Soft and non-tender with normal bowel sounds.  Musculoskeletal: Right AKA with prosthesis in place.  Neurologic: No focal weakness or paresthesias are detected, CN 2-12 intact  Skin: There are no ulcer or rashes noted. Psychiatric: The patient has normal affect. Cardiovascular: There is a regular rate and rhythm without significant murmur appreciated.   Vascular: Vessel Right Left  Radial 2+Palpable 2+Palpable  Carotid Palpable, without bruit Palpable, without bruit  Aorta Not palpable N/A  Femoral Not Palpable 2+Palpable  Popliteal AKA Not palpable  PT AKA not Palpable  DP AKA not Palpable      ASSESSMENT: Nathan [name] is a 67 y.o.  male who is s/p stent placement of left popliteal artery on 08-17-17 by Dr. Trula Slade for left leg rest pain.  He is also s/pleft SFA stenting in 2010. He is s/p right AKAin 2009.   He no longer has any left calf claudication since the left popliteal stent placed in April 2019. His left foot sx's remain the same, no signs of ischemia in his left foot or leg.  Part of his right AKA prosthesis is painful in his right groin. Prescription given to pt for [employer] to adjust and refit this.    DATA  Left LE Arterial Duplex (12-14-17): SFA stent: highest velocity is 208 cm/s at DFA, tri and biphasic waveforms Popliteal stent: highest velocity is at the mid stent at 212 cm/s. Biphasic waveforms from proximal to mid stent, monophasic at distal stent.  No significant change compared to the exam on 09-21-17.  ABI (Date: 12/14/2017):  R: AKA  L:   ABI: 0.63 (was 0.96 on 09-21-17),   PT: mono (was bi)  DP: mono (was mono)  TBI: not documented, toe pressure 54 (toe pressure was 62) Decline in left ABI and toe pressure, decline in quality of PT waveform.     PLAN:   Based on his non invasive exam results, HPI, and physical exam, pt to return in 3 months for left ABI,  left LE arterial duplex, and carotid duplex.  I advised him to notify us if he develops concerns re the circulation in his feet or legs.   Daily seated leg exercises discussed and demonstrated. He needs his prosthesis fitting better in order to walk more.   I discussed in depth with the patient the nature of atherosclerosis, and emphasized the importance of maximal medical management including strict control of blood pressure, blood glucose, and lipid levels, obtaining regular exercise, and continued cessation of smoking.  The patient is aware that without maximal medical management the underlying atherosclerotic disease process will progress, limiting the benefit of any interventions.  The patient was given information about PAD  including signs, symptoms, treatment, what symptoms should prompt the patient to seek  immediate medical care, and risk reduction measures to take.  Clemon Chambers, RN, MSN, FNP-C [facility] Phone: [phone]  Clinic MD: Early on call  12/14/17 3:55 PM

## 2017-12-24 ENCOUNTER — Other Ambulatory Visit: Payer: Self-pay

## 2017-12-24 DIAGNOSIS — I779 Disorder of arteries and arterioles, unspecified: Secondary | ICD-10-CM

## 2017-12-24 DIAGNOSIS — Z89611 Acquired absence of right leg above knee: Secondary | ICD-10-CM

## 2017-12-24 DIAGNOSIS — I739 Peripheral vascular disease, unspecified: Secondary | ICD-10-CM

## 2018-01-02 ENCOUNTER — Encounter: Payer: Self-pay | Admitting: Internal Medicine

## 2018-01-13 ENCOUNTER — Other Ambulatory Visit: Payer: Self-pay | Admitting: Internal Medicine

## 2018-01-13 ENCOUNTER — Other Ambulatory Visit: Payer: Self-pay | Admitting: Infectious Disease

## 2018-01-13 DIAGNOSIS — B20 Human immunodeficiency virus [HIV] disease: Secondary | ICD-10-CM

## 2018-01-13 DIAGNOSIS — E1142 Type 2 diabetes mellitus with diabetic polyneuropathy: Secondary | ICD-10-CM

## 2018-01-14 ENCOUNTER — Other Ambulatory Visit: Payer: Self-pay

## 2018-01-14 DIAGNOSIS — R64 Cachexia: Principal | ICD-10-CM

## 2018-01-14 DIAGNOSIS — B20 Human immunodeficiency virus [HIV] disease: Secondary | ICD-10-CM

## 2018-01-14 MED ORDER — BICTEGRAVIR-EMTRICITAB-TENOFOV 50-200-25 MG PO TABS: 1.0000 | ORAL_TABLET | Freq: Every day | ORAL | 0 refills | Status: DC

## 2018-01-14 MED ORDER — ZIDOVUDINE 300 MG PO TABS: ORAL_TABLET | ORAL | 0 refills | Status: DC

## 2018-01-16 ENCOUNTER — Other Ambulatory Visit (HOSPITAL_COMMUNITY)
Admission: RE | Admit: 2018-01-16 | Discharge: 2018-01-16 | Disposition: A | Discharge status: Home / Self Care | Payer: Medicare Other | Source: Ambulatory Visit | Attending: Infectious Disease | Admitting: Infectious Disease

## 2018-01-16 ENCOUNTER — Other Ambulatory Visit: Payer: Medicare Other

## 2018-01-16 DIAGNOSIS — K219 Gastro-esophageal reflux disease without esophagitis: Secondary | ICD-10-CM | POA: Insufficient documentation

## 2018-01-16 DIAGNOSIS — Z86010 Personal history of colonic polyps: Secondary | ICD-10-CM | POA: Insufficient documentation

## 2018-01-16 DIAGNOSIS — I10 Essential (primary) hypertension: Secondary | ICD-10-CM | POA: Diagnosis not present

## 2018-01-16 DIAGNOSIS — I6529 Occlusion and stenosis of unspecified carotid artery: Secondary | ICD-10-CM | POA: Insufficient documentation

## 2018-01-16 DIAGNOSIS — F1120 Opioid dependence, uncomplicated: Secondary | ICD-10-CM | POA: Insufficient documentation

## 2018-01-16 DIAGNOSIS — B20 Human immunodeficiency virus [HIV] disease: Secondary | ICD-10-CM | POA: Diagnosis not present

## 2018-01-16 DIAGNOSIS — E1142 Type 2 diabetes mellitus with diabetic polyneuropathy: Secondary | ICD-10-CM | POA: Insufficient documentation

## 2018-01-16 DIAGNOSIS — Z79899 Other long term (current) drug therapy: Secondary | ICD-10-CM

## 2018-01-16 DIAGNOSIS — I70219 Atherosclerosis of native arteries of extremities with intermittent claudication, unspecified extremity: Secondary | ICD-10-CM | POA: Insufficient documentation

## 2018-01-16 DIAGNOSIS — Z113 Encounter for screening for infections with a predominantly sexual mode of transmission: Secondary | ICD-10-CM

## 2018-01-16 DIAGNOSIS — E785 Hyperlipidemia, unspecified: Secondary | ICD-10-CM | POA: Insufficient documentation

## 2018-01-16 DIAGNOSIS — E1169 Type 2 diabetes mellitus with other specified complication: Secondary | ICD-10-CM | POA: Insufficient documentation

## 2018-01-16 DIAGNOSIS — Z89619 Acquired absence of unspecified leg above knee: Secondary | ICD-10-CM | POA: Insufficient documentation

## 2018-01-16 DIAGNOSIS — G35 Multiple sclerosis: Secondary | ICD-10-CM | POA: Insufficient documentation

## 2018-01-17 LAB — URINE CYTOLOGY ANCILLARY ONLY
Chlamydia: NEGATIVE
NEISSERIA GONORRHEA: NEGATIVE

## 2018-01-17 LAB — T-HELPER CELL (CD4) - (RCID CLINIC ONLY)
CD4 % Helper T Cell: 19 % — ABNORMAL LOW (ref 33–55)
CD4 T CELL ABS: 650 /uL (ref 400–2700)

## 2018-01-18 LAB — CBC WITH DIFFERENTIAL/PLATELET
BASOS ABS: 22 {cells}/uL (ref 0–200)
Basophils Relative: 0.4 %
Eosinophils Absolute: 198 cells/uL (ref 15–500)
Eosinophils Relative: 3.6 %
HCT: 36.6 % — ABNORMAL LOW (ref 38.5–50.0)
HEMOGLOBIN: 13.3 g/dL (ref 13.2–17.1)
Lymphs Abs: 3471 cells/uL (ref 850–3900)
MCH: 40.8 pg — ABNORMAL HIGH (ref 27.0–33.0)
MCHC: 36.3 g/dL — AB (ref 32.0–36.0)
MCV: 112.3 fL — ABNORMAL HIGH (ref 80.0–100.0)
MPV: 9 fL (ref 7.5–12.5)
Monocytes Relative: 4.7 %
NEUTROS ABS: 1551 {cells}/uL (ref 1500–7800)
Neutrophils Relative %: 28.2 %
Platelets: 195 10*3/uL (ref 140–400)
RBC: 3.26 10*6/uL — ABNORMAL LOW (ref 4.20–5.80)
RDW: 13.1 % (ref 11.0–15.0)
Total Lymphocyte: 63.1 %
WBC mixed population: 259 cells/uL (ref 200–950)
WBC: 5.5 10*3/uL (ref 3.8–10.8)

## 2018-01-18 LAB — LIPID PANEL
CHOL/HDL RATIO: 3.4 (calc) (ref <5.0)
CHOLESTEROL: 133 mg/dL (ref <200)
HDL: 39 mg/dL — AB (ref >40)
LDL Cholesterol (Calc): 71 mg/dL (calc)
Non-HDL Cholesterol (Calc): 94 mg/dL (calc) (ref <130)
TRIGLYCERIDES: 142 mg/dL (ref <150)

## 2018-01-18 LAB — COMPLETE METABOLIC PANEL WITH GFR
AG RATIO: 1.6 (calc) (ref 1.0–2.5)
ALT: 21 U/L (ref 9–46)
AST: 22 U/L (ref 10–35)
Albumin: 4.2 g/dL (ref 3.6–5.1)
Alkaline phosphatase (APISO): 50 U/L (ref 40–115)
BILIRUBIN TOTAL: 0.6 mg/dL (ref 0.2–1.2)
BUN: 10 mg/dL (ref 7–25)
CHLORIDE: 103 mmol/L (ref 98–110)
CO2: 25 mmol/L (ref 20–32)
Calcium: 9.6 mg/dL (ref 8.6–10.3)
Creat: 1.02 mg/dL (ref 0.70–1.25)
GFR, Est African American: 87 mL/min/{1.73_m2} (ref >=60)
GFR, Est Non African American: 75 mL/min/{1.73_m2} (ref >=60)
GLOBULIN: 2.6 g/dL (ref 1.9–3.7)
Glucose, Bld: 126 mg/dL — ABNORMAL HIGH (ref 65–99)
Potassium: 4.1 mmol/L (ref 3.5–5.3)
SODIUM: 138 mmol/L (ref 135–146)
Total Protein: 6.8 g/dL (ref 6.1–8.1)

## 2018-01-18 LAB — HIV-1 RNA QUANT-NO REFLEX-BLD
HIV 1 RNA QUANT: NOT DETECTED {copies}/mL
HIV-1 RNA Quant, Log: <1.3 Log copies/mL

## 2018-01-18 LAB — RPR: RPR Ser Ql: NONREACTIVE

## 2018-01-30 ENCOUNTER — Ambulatory Visit: Payer: Self-pay | Admitting: Infectious Disease

## 2018-01-31 ENCOUNTER — Encounter: Payer: Self-pay | Admitting: Infectious Diseases

## 2018-01-31 ENCOUNTER — Ambulatory Visit (INDEPENDENT_AMBULATORY_CARE_PROVIDER_SITE_OTHER): Payer: Medicare Other | Admitting: Infectious Diseases

## 2018-01-31 VITALS — BP 134/70 | HR 70 | Temp 98.0°F | Ht 73.0 in | Wt 183.0 lb

## 2018-01-31 DIAGNOSIS — E1142 Type 2 diabetes mellitus with diabetic polyneuropathy: Secondary | ICD-10-CM

## 2018-01-31 DIAGNOSIS — Z0000 Encounter for general adult medical examination without abnormal findings: Secondary | ICD-10-CM

## 2018-01-31 DIAGNOSIS — R768 Other specified abnormal immunological findings in serum: Secondary | ICD-10-CM | POA: Diagnosis not present

## 2018-01-31 DIAGNOSIS — Z8619 Personal history of other infectious and parasitic diseases: Secondary | ICD-10-CM

## 2018-01-31 DIAGNOSIS — B20 Human immunodeficiency virus [HIV] disease: Secondary | ICD-10-CM

## 2018-01-31 DIAGNOSIS — Z23 Encounter for immunization: Secondary | ICD-10-CM | POA: Diagnosis not present

## 2018-01-31 DIAGNOSIS — D126 Benign neoplasm of colon, unspecified: Secondary | ICD-10-CM

## 2018-01-31 DIAGNOSIS — I779 Disorder of arteries and arterioles, unspecified: Secondary | ICD-10-CM

## 2018-01-31 NOTE — Assessment & Plan Note (Signed)
Flu vaccine today. If he has not had a tetanus vaccine in the last 10 years would recommend updating this with primary care team.

## 2018-01-31 NOTE — Assessment & Plan Note (Signed)
Well controlled on current regimen - in care with PCP for this.

## 2018-01-31 NOTE — Assessment & Plan Note (Signed)
Well controlled on current regimen of zidovudine + biktarvy with undetectable VL. He has extensive resistance to NNRTIs, PIs and NRTIs. Recheck in 6 months with Dr. Tommy Medal.

## 2018-01-31 NOTE — Assessment & Plan Note (Signed)
Recommended to call [name] at [facility] team for scheduling his next colonoscopy - provided him with the contact to the office today.

## 2018-01-31 NOTE — Progress Notes (Signed)
Name: Nathan [name]  DOB: 29-Sep-1950 MRN: [id number] PCP: Welford Roche, MD   Patient Active Problem List   Diagnosis Date Noted  . Need for immunization against influenza 01/31/2018  . Opioid dependence ([place]) 10/13/2013  . History of syphilis 10/09/2013  . Atherosclerosis of native artery of extremity with intermittent claudication ([place]) 04/08/2012  . Occlusion and stenosis of carotid artery without mention of cerebral infarction 12/11/2011  . Phantom limb syndrome (right lower limb) 11/23/2011  . Health care maintenance 11/23/2011  . Status post above knee amputation ([place]) 08/02/2009  . DM type 2 with diabetic peripheral neuropathy ([place]) 04/05/2009  . Benign essential HTN 02/13/2008  . Hyperlipidemia associated with type 2 diabetes mellitus ([place]) 07/29/2007  . Human immunodeficiency virus I infection ([place]) 02/12/2006  . COLONIC POLYPS, ADENOMATOUS 02/12/2006  . Multiple sclerosis ([place]) 02/12/2006  . PERIPHERAL VASCULAR DISEASE 02/12/2006  . GERD 02/12/2006  . Hepatitis B core antibody positive 02/12/2006     Subjective:   Chief Complaint  Patient presents with  . Follow-up    HIV    Nathan [name]  is here today for follow up for his HIV care. He tells me he is doing very well over all but apparently his popliteal artery stent that was placed in March/April of this year is starting to clot off - he is working with Dr. Trula Slade and has a repeat ABI and vascular studies in November. He is adherent with his plavix as well as all of his other medications including his Biktarvy and Zidovudine. He has no trouble with his medications and no concerns over side effects.   He has a family history of colorectal cancer (brother whom passed away from this in 2016/07/18) and a recommendation by [name] for q5y screenings - he knows he is overdue.   Has not yet received his flu vaccine this year and otherwise has no complaints today aside from his stent as discussed above.  Specifically reports no complaints today suggestive of associated opportunistic infection or advancing HIV disease such as fevers, night sweats, weight loss, anorexia, cough, SOB, nausea, vomiting, diarrhea, headache, sensory changes, lymphadenopathy or oral thrush.     Review of Systems  Constitutional: Negative for chills, fever, malaise/fatigue and weight loss.  HENT: Negative for sore throat.        Uses dentures  Respiratory: Negative for cough and sputum production.   Cardiovascular: Negative for chest pain and leg swelling.  Gastrointestinal: Negative for abdominal pain, diarrhea and vomiting.  Genitourinary: Negative for dysuria and flank pain.  Musculoskeletal: Negative for joint pain, myalgias and neck pain.       R AKA with prosthesis  Skin: Negative for rash.  Neurological: Negative for dizziness, tingling and headaches.  Psychiatric/Behavioral: Negative for depression and substance abuse. The patient is not nervous/anxious and does not have insomnia.     Past Medical History:  Diagnosis Date  . Asthma    per 2001/07/18 [facility] pulm records pfts   . Blood dyscrasia    HIV  . Clotting disorder ([place])   . Colon polyps    noted previous colonoscopy [facility]  . DDD (degenerative disc disease)    cervical spine  . Depression   . Diabetes mellitus    dx 07/18/2008  . Fever    unknwon origin  . GERD (gastroesophageal reflux disease)   . Head swelling 05/23/2017  . Hep C w/o coma, chronic ([place])   . History of syphilis    noted [facility] records  .  HIV infection ([place])    undetectable viral load and CD4 ct 667 as of 11/2011  . Hyperlipidemia   . Hypertension   . MRSA (methicillin resistant Staphylococcus aureus)   . Multiple sclerosis ([place])    in remission as of 11/2011 (diagnosed late 1980s)  . Pain in limb-Right Leg 10/13/2013  . 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[place])    2002  . Pneumonia   . PVD (peripheral vascular disease) ([place])    Left Stent 07/02/2008  . Scalp lesion 07/26/2017    . UTI (urinary tract infection)   . Wears dentures     Outpatient Medications Prior to Visit  Medication Sig Dispense Refill  . atorvastatin (LIPITOR) 40 MG tablet TAKE 1 TABLET(40 MG) BY MOUTH DAILY 90 tablet 1  . bictegravir-emtricitabine-tenofovir AF (BIKTARVY) 50-200-25 MG TABS tablet Take 1 tablet by mouth daily. 30 tablet 0  . Blood Glucose Monitoring Suppl (BAYER CONTOUR MONITOR) W/DEVICE KIT Use to check blood sugar as instructed up to 4 times a day 1 kit 0  . clopidogrel (PLAVIX) 75 MG tablet TAKE 1 TABLET BY MOUTH DAILY 90 tablet 5  . CONTOUR NEXT TEST test strip USE TO TEST BLOOD SUGAR FOUR TIMES DAILY BEFORE MEALS AND AT BEDTIME 100 each 5  . ibuprofen (ADVIL,MOTRIN) 200 MG tablet Take 600 mg by mouth 3 (three) times daily as needed for headache or moderate pain.     . Insulin Glargine (LANTUS SOLOSTAR) 100 UNIT/ML Solostar Pen Inject 70 Units into the skin daily at 10 pm. Diagnosis code  E11.42 15 mL 11  . insulin lispro (HUMALOG KWIKPEN) 100 UNIT/ML KiwkPen Inject 0.22 mLs (22 Units total) into the skin 3 (three) times daily with meals. 30 mL 11  . Insulin Pen Needle (BD PEN NEEDLE NANO U/F) 32G X 4 MM MISC USE AS DIRECTED FOUR TIMES DAILY. Dx code  E11.42 100 each 5  . Lancet Devices (BAYER MICROLET 2 LANCING DEVIC) MISC Use to check blood sugar up to 3 ties a day 1 each 2  . lisinopril (PRINIVIL,ZESTRIL) 40 MG tablet TAKE 1 TABLET BY MOUTH DAILY 90 tablet 0  . omeprazole (PRILOSEC) 40 MG capsule TAKE ONE CAPSULE BY MOUTH EVERY DAY 90 capsule 3  . pioglitazone (ACTOS) 30 MG tablet TAKE 1 TABLET BY MOUTH EVERY DAY 90 tablet 0  . VICTOZA 18 MG/3ML SOPN ADMINISTER 1.8 MG UNDER THE SKIN EVERY MORNING 9 mL 3  . zidovudine (RETROVIR) 300 MG tablet TAKE 1 TABLET(300 MG) BY MOUTH TWICE DAILY 60 tablet 0   No facility-administered medications prior to visit.      Allergies  Allergen Reactions  . Sulfonamide Derivatives Hives    Social History   Tobacco Use  . Smoking status:  Former Smoker    Last attempt to quit: 05/09/2007    Years since quitting: 10.7  . Smokeless tobacco: Former Systems developer    Types: Chew  Substance Use Topics  . Alcohol use: No    Alcohol/week: 0.0 standard drinks  . Drug use: No    Family History  Problem Relation Age of Onset  . Diabetes Mother   . Coronary artery disease Mother   . Cancer Brother        colon caner stage 4 as of 11/2011 (unknown age of onset)  . Coronary artery disease Father   . Prostate cancer Brother     Social History   Substance and Sexual Activity  Sexual Activity Yes  . Partners: Male   Comment: declined condoms  Objective:   Vitals:   01/31/18 0843  BP: 134/70  Pulse: 70  Temp: 98 F (36.7 C)  Weight: 183 lb (83 kg)  Height: _0  (1.854 m)   Body mass index is 24.14 kg/m.  Physical Exam  Constitutional: He is oriented to person, place, and time. He appears well-developed and well-nourished.  Seated comfortably in chair during visit.   HENT:  Mouth/Throat: Oropharynx is clear and moist and mucous membranes are normal. Normal dentition. No dental abscesses.  Eyes: No scleral icterus.  Neck: No thyromegaly present.  Cardiovascular: Normal rate, regular rhythm and normal heart sounds.  No murmur heard. Pulmonary/Chest: Effort normal and breath sounds normal.  Abdominal: Soft. He exhibits no distension. There is no tenderness.  Musculoskeletal: Normal range of motion. He exhibits no edema.  R AKA with prosthesis in place.   Lymphadenopathy:    He has no cervical adenopathy.  Neurological: He is alert and oriented to person, place, and time.  Skin: Skin is warm and dry. Capillary refill takes less than 2 seconds. No rash noted.  Psychiatric: He has a normal mood and affect. Judgment normal.  In good spirits today and engaged in care discussion.   Vitals reviewed.   Lab Results Lab Results  Component Value Date   WBC 5.5 01/16/2018   HGB 13.3 01/16/2018   HCT 36.6 (L) 01/16/2018    MCV 112.3 (H) 01/16/2018   PLT 195 01/16/2018    Lab Results  Component Value Date   CREATININE 1.02 01/16/2018   BUN 10 01/16/2018   NA 138 01/16/2018   K 4.1 01/16/2018   CL 103 01/16/2018   CO2 25 01/16/2018    Lab Results  Component Value Date   ALT 21 01/16/2018   AST 22 01/16/2018   ALKPHOS 58 01/05/2016   BILITOT 0.6 01/16/2018    Lab Results  Component Value Date   CHOL 133 01/16/2018   HDL 39 (L) 01/16/2018   LDLCALC 71 01/16/2018   TRIG 142 01/16/2018   CHOLHDL 3.4 01/16/2018   HIV 1 RNA Quant (copies/mL)  Date Value  01/16/2018 <20 NOT DETECTED  05/10/2017 <20 NOT DETECTED  04/03/2017 56 (H)   HIV-1 RNA Viral Load (no units)  Date Value  05/29/2013 <40  03/04/2013 <40  12/12/2012 <40   CD4 (no units)  Date Value  05/29/2013 632  03/04/2013 655  12/12/2012 563   CD4 T Cell Abs (/uL)  Date Value  01/16/2018 650  05/10/2017 700  04/03/2017 990     Assessment & Plan:   Problem List Items Addressed This Visit      Unprioritized   COLONIC POLYPS, ADENOMATOUS    Recommended to call [name] at [facility] team for scheduling his next colonoscopy - provided him with the contact to the office today.       DM type 2 with diabetic peripheral neuropathy (HCC)    Well controlled on current regimen - in care with PCP for this.       Health care maintenance    Flu vaccine today. If he has not had a tetanus vaccine in the last 10 years would recommend updating this with primary care team.       Hepatitis B core antibody positive    On TAF with Biktarvy - no previous actively replicating virus and no withdrawal or lapse in HIV meds. LFTs normal.       History of syphilis    Hx of syphilis - check RPR  next visit in 60m No active s/s today.       Human immunodeficiency virus I infection ([place] - Primary    Well controlled on current regimen of zidovudine + biktarvy with undetectable VL. He has extensive resistance to NNRTIs, PIs and NRTIs.  Recheck in 6 months with Dr. VTommy Medal       Relevant Orders   T-helper cell (CD4)- ([facility] clinic only)   HIV-1 RNA quant-no reflex-bld   RPR   Need for immunization against influenza   Relevant Orders   Flu Vaccine QUAD 36+ mos IM (Completed)     No orders of the defined types were placed in this encounter.  Return in about 6 months (around 08/01/2018).   SJanene Madeira MSN, NP-C [facility] Waynefor Infectious DCopperopolisPager: [phone]: [phone] 01/31/18  9:09 AM

## 2018-01-31 NOTE — Assessment & Plan Note (Signed)
Hx of syphilis - check RPR next visit in 81m. No active s/s today.

## 2018-01-31 NOTE — Assessment & Plan Note (Signed)
On TAF with Biktarvy - no previous actively replicating virus and no withdrawal or lapse in HIV meds. LFTs normal.

## 2018-01-31 NOTE — Patient Instructions (Signed)
Your colonoscopy was due in 2018 - please when you are ready call [facility] team @ [phone] to schedule an appointment with [name].   Continue your Biktarvy and Zidovudine every day as you are. Your viral load is undetectable and CD4 is in a good healthy range.   Flu shot today.   Otherwise your vaccines are up to date. Will need a tetanus shot soon if it has been over 10 years.   Please return in 6 months to see Dr. Tommy Medal with labs prior to.

## 2018-02-11 ENCOUNTER — Other Ambulatory Visit: Payer: Self-pay | Admitting: Infectious Disease

## 2018-02-11 ENCOUNTER — Other Ambulatory Visit: Payer: Self-pay

## 2018-02-11 DIAGNOSIS — B20 Human immunodeficiency virus [HIV] disease: Secondary | ICD-10-CM

## 2018-02-11 DIAGNOSIS — R64 Cachexia: Principal | ICD-10-CM

## 2018-02-11 MED ORDER — BICTEGRAVIR-EMTRICITAB-TENOFOV 50-200-25 MG PO TABS: 1.0000 | ORAL_TABLET | Freq: Every day | ORAL | 5 refills | Status: DC

## 2018-02-11 MED ORDER — ZIDOVUDINE 300 MG PO TABS: ORAL_TABLET | ORAL | 5 refills | Status: DC

## 2018-02-12 ENCOUNTER — Other Ambulatory Visit: Payer: Self-pay | Admitting: Internal Medicine

## 2018-02-12 DIAGNOSIS — I739 Peripheral vascular disease, unspecified: Secondary | ICD-10-CM

## 2018-02-12 NOTE — Telephone Encounter (Signed)
Next appt scheduled 11/8 with PCP. 

## 2018-03-12 ENCOUNTER — Other Ambulatory Visit: Payer: Self-pay

## 2018-03-12 DIAGNOSIS — I6523 Occlusion and stenosis of bilateral carotid arteries: Secondary | ICD-10-CM

## 2018-03-14 ENCOUNTER — Other Ambulatory Visit: Payer: Self-pay | Admitting: Internal Medicine

## 2018-03-14 DIAGNOSIS — E1165 Type 2 diabetes mellitus with hyperglycemia: Principal | ICD-10-CM

## 2018-03-14 DIAGNOSIS — IMO0002 Reserved for concepts with insufficient information to code with codable children: Secondary | ICD-10-CM

## 2018-03-14 DIAGNOSIS — E1142 Type 2 diabetes mellitus with diabetic polyneuropathy: Secondary | ICD-10-CM

## 2018-03-15 ENCOUNTER — Encounter: Payer: Self-pay | Admitting: Internal Medicine

## 2018-03-15 ENCOUNTER — Ambulatory Visit (INDEPENDENT_AMBULATORY_CARE_PROVIDER_SITE_OTHER): Payer: Medicare Other | Admitting: Internal Medicine

## 2018-03-15 ENCOUNTER — Other Ambulatory Visit: Payer: Self-pay

## 2018-03-15 VITALS — BP 123/58 | HR 71 | Wt 186.2 lb

## 2018-03-15 DIAGNOSIS — E1142 Type 2 diabetes mellitus with diabetic polyneuropathy: Secondary | ICD-10-CM

## 2018-03-15 DIAGNOSIS — Z79899 Other long term (current) drug therapy: Secondary | ICD-10-CM | POA: Diagnosis not present

## 2018-03-15 DIAGNOSIS — Z7902 Long term (current) use of antithrombotics/antiplatelets: Secondary | ICD-10-CM

## 2018-03-15 DIAGNOSIS — Z794 Long term (current) use of insulin: Secondary | ICD-10-CM

## 2018-03-15 DIAGNOSIS — Z87891 Personal history of nicotine dependence: Secondary | ICD-10-CM | POA: Diagnosis not present

## 2018-03-15 DIAGNOSIS — I10 Essential (primary) hypertension: Secondary | ICD-10-CM

## 2018-03-15 LAB — POCT GLYCOSYLATED HEMOGLOBIN (HGB A1C): Hemoglobin A1C: 5.6 % (ref 4.0–5.6)

## 2018-03-15 LAB — GLUCOSE, CAPILLARY: GLUCOSE-CAPILLARY: 104 mg/dL — AB (ref 70–99)

## 2018-03-15 NOTE — Patient Instructions (Addendum)
Mr. Heist,   Your A1c is 5.6.   Please stop taking Vicotza. Continue taking your other diabetes medications as usual. Please make a follow up appointment with me in 3 months to check on your diabetes as we made changes in your medications today.    Your blood pressure is at goal. Continue taking lisinopril as usual.   Please keep your appointment for the colonoscopy. Colon cancer screening is very important.  Please call us if you have any questions or concerns.   - Dr. Frederico Hamman

## 2018-03-15 NOTE — Progress Notes (Signed)
   CC: T2DM and HTN follow up   HPI:  Nathan [name] is a 67 y.o. year-old male with PMH listed below who presents to clinic for T2DM and HTN follow up. Please see problem based assessment and plan for further details.   Past Medical History:  Diagnosis Date  . Asthma    per 2003 [facility] pulm records pfts   . Blood dyscrasia    HIV  . Clotting disorder ([place])   . Colon polyps    noted previous colonoscopy [facility]  . DDD (degenerative disc disease)    cervical spine  . Depression   . Diabetes mellitus    dx 2010  . Fever    unknwon origin  . GERD (gastroesophageal reflux disease)   . Head swelling 05/23/2017  . Hep C w/o coma, chronic ([place])   . History of syphilis    noted [facility] records  . HIV infection ([place])    undetectable viral load and CD4 ct 667 as of 11/2011  . Hyperlipidemia   . Hypertension   . MRSA (methicillin resistant Staphylococcus aureus)   . Multiple sclerosis ([place])    in remission as of 11/2011 (diagnosed late 1980s)  . Pain in limb-Right Leg 10/13/2013  . 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 Pneumonia   . PVD (peripheral vascular disease) ([place])    Left Stent 07/02/2008  . Scalp lesion 07/26/2017  . UTI (urinary tract infection)   . Wears dentures    Review of Systems:   Review of Systems  Constitutional: Negative for chills, fever and malaise/fatigue.  Respiratory: Negative for shortness of breath.   Cardiovascular: Negative for chest pain and leg swelling.  Genitourinary: Negative for frequency and urgency.  Neurological: Negative for dizziness and headaches.    Physical Exam: Vitals:   03/15/18 1454  BP: (!) 152/70  Pulse: 73  SpO2: 97%  Weight: 186 lb 3.2 oz (84.5 kg)    General: well-developed, well-nourished male, in no acute distress Cardiac: regular rate and rhythm, nl S1/S2, no murmurs, rubs or gallops Pulm: CTAB, no wheezes or crackles, no increased work of breathing on room air  Ext: warm and well perfused, no  peripheral edema   Assessment & Plan:   See Encounters Tab for problem based charting.  Patient discussed with Dr. Beryle Beams

## 2018-03-16 ENCOUNTER — Encounter: Payer: Self-pay | Admitting: Internal Medicine

## 2018-03-16 NOTE — Assessment & Plan Note (Signed)
Well controlled on lisinopril 40 mg QD. Will continue current regimen.

## 2018-03-16 NOTE — Progress Notes (Signed)
Medicine attending: Medical history, presenting problems, physical findings, and medications, reviewed with resident physician Dr Santos-Sanchez on the day of the patient visit and I concur with her evaluation and management plan. 

## 2018-03-16 NOTE — Assessment & Plan Note (Signed)
Patient presents for follow-up of T2DM. On Lantus 70 QHS , NovoLog 18/22/22 TID with meals, Victoza, and pioglitazone.  Blood glucose meter download reviewed.  No episodes of hypoglycemia. BG on target 92% of the time. He has been well controlled during the past year and his A1c today is 5.6.  We will discontinue Victoza.   - Continue Lantus, NovoLog, and pioglitazone - Follow-up in 3 months - Foot exam performed today - Eye exam 10/2017: No evidence of retinopathy

## 2018-03-25 ENCOUNTER — Ambulatory Visit (INDEPENDENT_AMBULATORY_CARE_PROVIDER_SITE_OTHER): Payer: Medicare Other | Admitting: Family

## 2018-03-25 ENCOUNTER — Ambulatory Visit (INDEPENDENT_AMBULATORY_CARE_PROVIDER_SITE_OTHER)
Admission: RE | Admit: 2018-03-25 | Discharge: 2018-03-25 | Disposition: A | Discharge status: Home / Self Care | Payer: Medicare Other | Source: Ambulatory Visit | Attending: Family | Admitting: Family

## 2018-03-25 ENCOUNTER — Other Ambulatory Visit: Payer: Self-pay

## 2018-03-25 ENCOUNTER — Ambulatory Visit (HOSPITAL_COMMUNITY)
Admission: RE | Admit: 2018-03-25 | Discharge: 2018-03-25 | Disposition: A | Discharge status: Home / Self Care | Payer: Medicare Other | Source: Ambulatory Visit | Attending: Family | Admitting: Family

## 2018-03-25 ENCOUNTER — Encounter: Payer: Self-pay | Admitting: Family

## 2018-03-25 VITALS — BP 143/67 | HR 63 | Temp 97.4°F | Resp 18 | Ht 73.0 in | Wt 184.0 lb

## 2018-03-25 DIAGNOSIS — I779 Disorder of arteries and arterioles, unspecified: Secondary | ICD-10-CM

## 2018-03-25 DIAGNOSIS — Z87891 Personal history of nicotine dependence: Secondary | ICD-10-CM

## 2018-03-25 DIAGNOSIS — I739 Peripheral vascular disease, unspecified: Secondary | ICD-10-CM

## 2018-03-25 DIAGNOSIS — I6523 Occlusion and stenosis of bilateral carotid arteries: Secondary | ICD-10-CM | POA: Insufficient documentation

## 2018-03-25 DIAGNOSIS — Z89611 Acquired absence of right leg above knee: Secondary | ICD-10-CM | POA: Diagnosis not present

## 2018-03-25 NOTE — Patient Instructions (Signed)
Peripheral Vascular Disease Peripheral vascular disease (PVD) is a disease of the blood vessels that are not part of your heart and brain. A simple term for PVD is poor circulation. In most cases, PVD narrows the blood vessels that carry blood from your heart to the rest of your body. This can result in a decreased supply of blood to your arms, legs, and internal organs, like your stomach or kidneys. However, it most often affects a person's lower legs and feet. There are two types of PVD.  Organic PVD. This is the more common type. It is caused by damage to the structure of blood vessels.  Functional PVD. This is caused by conditions that make blood vessels contract and tighten (spasm).  Without treatment, PVD tends to get worse over time. PVD can also lead to acute ischemic limb. This is when an arm or limb suddenly has trouble getting enough blood. This is a medical emergency. Follow these instructions at home:  Take medicines only as told by your doctor.  Do not use any tobacco products, including cigarettes, chewing tobacco, or electronic cigarettes. If you need help quitting, ask your doctor.  Lose weight if you are overweight, and maintain a healthy weight as told by your doctor.  Eat a diet that is low in fat and cholesterol. If you need help, ask your doctor.  Exercise regularly. Ask your doctor for some good activities for you.  Take good care of your feet. ? Wear comfortable shoes that fit well. ? Check your feet often for any cuts or sores. Contact a doctor if:  You have cramps in your legs while walking.  You have leg pain when you are at rest.  You have coldness in a leg or foot.  Your skin changes.  You are unable to get or have an erection (erectile dysfunction).  You have cuts or sores on your feet that are not healing. Get help right away if:  Your arm or leg turns cold and blue.  Your arms or legs become red, warm, swollen, painful, or numb.  You have  chest pain or trouble breathing.  You suddenly have weakness in your face, arm, or leg.  You become very confused or you cannot speak.  You suddenly have a very bad headache.  You suddenly cannot see. This information is not intended to replace advice given to you by your health care provider. Make sure you discuss any questions you have with your health care provider. Document Released: 07/19/2009 Document Revised: 09/30/2015 Document Reviewed: 10/02/2013 Elsevier Interactive Patient Education  2017 Elsevier Inc.       Stroke Prevention Some health problems and behaviors may make it more likely for you to have a stroke. Below are ways to lessen your risk of having a stroke.  Be active for at least 30 minutes on most or all days.  Do not smoke. Try not to be around others who smoke.  Do not drink too much alcohol. ? Do not have more than 2 drinks a day if you are a man. ? Do not have more than 1 drink a day if you are a woman and are not pregnant.  Eat healthy foods, such as fruits and vegetables. If you were put on a specific diet, follow the diet as told.  Keep your cholesterol levels under control through diet and medicines. Look for foods that are low in saturated fat, trans fat, cholesterol, and are high in fiber.  If you have diabetes, follow   all diet plans and take your medicine as told.  Ask your doctor if you need treatment to lower your blood pressure. If you have high blood pressure (hypertension), follow all diet plans and take your medicine as told by your doctor.  If you are 18-39 years old, have your blood pressure checked every 3-5 years. If you are age 40 or older, have your blood pressure checked every year.  Keep a healthy weight. Eat foods that are low in calories, salt, saturated fat, trans fat, and cholesterol.  Do not take drugs.  Avoid birth control pills, if this applies. Talk to your doctor about the risks of taking birth control pills.  Talk to  your doctor if you have sleep problems (sleep apnea).  Take all medicine as told by your doctor. ? You may be told to take aspirin or blood thinner medicine. Take this medicine as told by your doctor. ? Understand your medicine instructions.  Make sure any other conditions you have are being taken care of.  Get help right away if:  You suddenly lose feeling (you feel numb) or have weakness in your face, arm, or leg.  Your face or eyelid hangs down to one side.  You suddenly feel confused.  You have trouble talking (aphasia) or understanding what people are saying.  You suddenly have trouble seeing in one or both eyes.  You suddenly have trouble walking.  You are dizzy.  You lose your balance or your movements are clumsy (uncoordinated).  You suddenly have a very bad headache and you do not know the cause.  You have new chest pain.  Your heart feels like it is fluttering or skipping a beat (irregular heartbeat). Do not wait to see if the symptoms above go away. Get help right away. Call your local emergency services (911 in [place] Do not drive yourself to the hospital. This information is not intended to replace advice given to you by your health care provider. Make sure you discuss any questions you have with your health care provider. Document Released: 10/24/2011 Document Revised: 09/30/2015 Document Reviewed: 10/25/2012 Elsevier Interactive Patient Education  2018 Elsevier Inc.  

## 2018-03-25 NOTE — Progress Notes (Signed)
[facility] [place]   CC: Follow up peripheral artery occlusive disease  History of Present Illness Nathan [name] is a 67 y.o. male who is s/p stent placement of left popliteal artery on 08-17-17 by Dr. Trula Slade for left leg rest pain.  He is alsos/pleft SFA stenting in 2010. He is s/p right AKAin 2009.  Ptis ambulating with a right above-knee prosthesis. Heworked with [employer] to address themoderate pain he was havingat the bony prominence of the right AKA stump with walking, and had resolution of this pain. He denies any stump wounds. He has stinging and burning in foot up to left knee, attributes to neuropathy.  Pt states left calf feels much improved since the procedureon 08-17-17, but left foot feels no improvement, no wounds per pt.  He states his left foot is numb, states has worsened over time.   He has MS, which he states has been in remission for years.  Pt states he does not have DDD, no back problems.   Pt denies any history of stroke or TIA.  He was evaluated by K. Trinh, PA-C, on 07-17-16. At that time left SFA stentwas patent and ABIs stable.He continuedto have some neuropathic type symptoms in the left leg and foot. Ambulating well with right leg prosthesis. Pt advised to continue maximal medical management with diabetes control, plavix and statin. Follow up in one year with repeat ABIs and left leg arterial duplex. Carotid duplex demonstrated60-79% bilateral carotid stenosis, he hadbeen asymptomatic. His intermittent bilateral eye blurriness resolvedwith blinking and hadbeen unchanged for the priorseveral years. Didnot appear to be amaurosis fugax. Did discuss with Dr. Trula Slade that he is below the 80% threshold for surgical intervention. Pt was tofollow up in 6 months with repeat carotid duplex. The patient knows to seek emergency assistance if he develops any TIA or stroke symptoms.   Diabetic: Yes,5.6 A1C on  03-15-18 Tobacco use: former smoker, quit in 2009  Pt meds include: Statin :Yes ASA: No Other anticoagulants/antiplatelets: Plavix     Past Medical History:  Diagnosis Date  . Asthma    per 2003 [facility] pulm records pfts   . Blood dyscrasia    HIV  . Clotting disorder ([place])   . Colon polyps    noted previous colonoscopy [facility]  . DDD (degenerative disc disease)    cervical spine  . Depression   . Diabetes mellitus    dx 2010  . Fever    unknwon origin  . GERD (gastroesophageal reflux disease)   . Head swelling 05/23/2017  . Hep C w/o coma, chronic ([place])   . History of syphilis    noted [facility] records  . HIV infection ([place])    undetectable viral load and CD4 ct 667 as of 11/2011  . Hyperlipidemia   . Hypertension   . MRSA (methicillin resistant Staphylococcus aureus)   . Multiple sclerosis ([place])    in remission as of 11/2011 (diagnosed late 1980s)  . Pain in limb-Right Leg 10/13/2013  . 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[place])    2002  . Pneumonia   . PVD (peripheral vascular disease) ([place])    Left Stent 07/02/2008  . Scalp lesion 07/26/2017  . UTI (urinary tract infection)   . Wears dentures     Social History Social History   Tobacco Use  . Smoking status: Former Smoker    Last attempt to quit: 05/09/2007    Years since quitting: 10.8  . Smokeless tobacco: Former Systems developer    Types:  Chew  Substance Use Topics  . Alcohol use: No    Alcohol/week: 0.0 standard drinks  . Drug use: No    Family History Family History  Problem Relation Age of Onset  . Diabetes Mother   . Coronary artery disease Mother   . Cancer Brother        colon caner stage 4 as of 11/2011 (unknown age of onset)  . Coronary artery disease Father   . Prostate cancer Brother     Past Surgical History:  Procedure Laterality Date  . ABDOMINAL AORTOGRAM W/LOWER EXTREMITY N/A 08/07/2017   Procedure: ABDOMINAL AORTOGRAM W/LOWER EXTREMITY;  Surgeon: Serafina Mitchell, MD;  Location: [facility];  Service: Cardiovascular;  Laterality: N/A;  . ABOVE KNEE LEG AMPUTATION     Right Leg  . COLONOSCOPY W/ BIOPSIES AND POLYPECTOMY    . LOWER EXTREMITY ANGIOGRAM Left 12/26/2011   Procedure: LOWER EXTREMITY ANGIOGRAM;  Surgeon: Serafina Mitchell, MD;  [facility];  Service: Cardiovascular;  Laterality: Left;  . LOWER EXTREMITY ANGIOGRAM Left 08/17/2017   Procedure: LOWER EXTREMITY ANGIOGRAM LEFT LEG WITH RUNOFF AND Stenting.;  Surgeon: Serafina Mitchell, MD;  Location: [place];  Service: Vascular;  Laterality: Left;  Marland Kitchen MULTIPLE TOOTH EXTRACTIONS    . OTHER SURGICAL HISTORY     right lower ext AKA with prothesis   . OTHER SURGICAL HISTORY     left left with stents (Dr. Seward Speck)  . OTHER SURGICAL HISTORY     2003 colonoscopy 5 mm polyp transverse colon; (2)62m  polyps in rectum-hyperplastic  . PERIPHERAL VASCULAR INTERVENTION Left 08/17/2017   Procedure: POPLITEAL STENT;  Surgeon: BSerafina Mitchell MD;  [facility]  Service: Vascular;  Laterality: Left;    Allergies  Allergen Reactions  . Sulfonamide Derivatives Hives    Current Outpatient Medications  Medication Sig Dispense Refill  . atorvastatin (LIPITOR) 40 MG tablet TAKE 1 TABLET(40 MG) BY MOUTH DAILY 90 tablet 1  . bictegravir-emtricitabine-tenofovir AF (BIKTARVY) 50-200-25 MG TABS tablet Take 1 tablet by mouth daily. 30 tablet 5  . Blood Glucose Monitoring Suppl (BAYER CONTOUR MONITOR) W/DEVICE KIT Use to check blood sugar as instructed up to 4 times a day 1 kit 0  . clopidogrel (PLAVIX) 75 MG tablet TAKE 1 TABLET BY MOUTH DAILY 90 tablet 3  . CONTOUR NEXT TEST test strip USE TO TEST BLOOD SUGAR AS DIRECTED FOUR TIMES DAILY BEFORE MEALS AND AT BEDTIME 375 each 1  . HUMALOG KWIKPEN 100 UNIT/ML KwikPen   11  . ibuprofen (ADVIL,MOTRIN) 200 MG tablet Take 600 mg by mouth 3 (three) times daily as needed for headache or moderate pain.     . Insulin Glargine (LANTUS SOLOSTAR) 100 UNIT/ML Solostar Pen Inject 70 Units  into the skin daily at 10 pm. Diagnosis code  E11.42 15 mL 11  . insulin lispro (HUMALOG KWIKPEN) 100 UNIT/ML KiwkPen Inject 0.22 mLs (22 Units total) into the skin 3 (three) times daily with meals. 30 mL 11  . Insulin Pen Needle (BD PEN NEEDLE NANO U/F) 32G X 4 MM MISC USE AS DIRECTED FOUR TIMES DAILY. Dx code  E11.42 100 each 5  . Lancet Devices (BAYER MICROLET 2 LANCING DEVIC) MISC Use to check blood sugar up to 3 ties a day 1 each 2  . lisinopril (PRINIVIL,ZESTRIL) 40 MG tablet TAKE 1 TABLET BY MOUTH DAILY 90 tablet 1  . omeprazole (PRILOSEC) 40 MG capsule TAKE ONE CAPSULE BY MOUTH EVERY DAY 90 capsule  3  . pioglitazone (ACTOS) 30 MG tablet TAKE 1 TABLET BY MOUTH EVERY DAY 90 tablet 0  . zidovudine (RETROVIR) 300 MG tablet TAKE 1 TABLET(300 MG) BY MOUTH TWICE DAILY 60 tablet 5  . VICTOZA 18 MG/3ML SOPN ADMINISTER 1.8 MG UNDER THE SKIN EVERY MORNING (Patient not taking: Reported on 03/25/2018) 9 mL 3   No current facility-administered medications for this visit.     ROS: See HPI for pertinent positives and negatives.   Physical Examination  Vitals:   03/25/18 1045  BP: (!) 143/67  Pulse: 63  Resp: 18  Temp: (!) 97.4 F (36.3 C)  TempSrc: Oral  SpO2: 98%  Weight: 184 lb (83.5 kg)  Height: '6\' 1"'  (1.854 m)   Body mass index is 24.28 kg/m.  General: A&O x 3, WDWN, male. Gait: normal HENT: No gross abnormalities. Bald Eyes: PERRLA. Pulmonary: Respirations are non labored, CTAB, good air movement in all fields Cardiac: regular rhythm, no detected murmur.         Carotid Bruits Right Left   Positive Negative   Radial pulses are 2+ palpable bilaterally   Adominal aortic pulse is not palpable                         VASCULAR EXAM: Extremities without ischemic changes, without Gangrene; without open wounds. Right AKA with prosthesis in place.                                                                                                           LE Pulses Right Left        FEMORAL  not palpable  2+ palpable        POPLITEAL  AKA  not palpable       POSTERIOR TIBIAL  AKA  not palpable        DORSALIS PEDIS      ANTERIOR TIBIAL  AKA not palpable    Abdomen: soft, NT, no palpable masses. Skin: no rashes, no cellulitis, no ulcers noted. Musculoskeletal: no muscle wasting or atrophy.  Neurologic: A&O X 3; appropriate affect, Sensation is normal except for absent to light touch in left forefoot, but does have some sensation to light touch in left 5th toe; MOTOR FUNCTION:  moving all extremities equally, motor strength 5/5 throughout. Speech is fluent/normal. CN 2-12 intact. Psychiatric: Thought content is normal, mood appropriate for clinical situation.    ASSESSMENT: Nathan [name] is a 67 y.o. male who is s/p stent placement of left popliteal artery on 08-17-17 by Dr. Trula Slade for left leg rest pain.  He is alsos/pleft SFA stenting in 2010.  He is s/p right AKAin 2009.   He no longer has any left calf claudication since the left popliteal stent placed in April 2019. His left foot is numb, this has worsened over time, no signs of ischemia in his left foot or leg.  [employer] adjusted his prosthesis, no longer is painful in his right groin.    DATA  Left LE Arterial Duplex (03-25-18): Highest  velocity is 266 cm/s in the mid stent, increased velocity compared to 212 cm/s at mid stent on 12-14-17. All triphasic waveforms in stent. Triphasic waveforms form distal CFA to mid SFA, biphasic waveforms at distal SFA, triphasic at proximal popliteal, monophasic at distal ATA and distal PTA.    ABI (Date: 03/25/2018):  R: AKA  L:   ABI: 0.79 (was 0.63 on 12-14-17),   PT: bi (was mono)  DP: tri (was mono)  TBI: 0.44, toe pressure 63, (was 0.39) Improved left ABI and waveform quality. Moderate disease in the left LE with bi and triphasic waveforms (were mono).   PLAN:   Based on hisnon invasive examresults, HPI, and physical exam, pt to return  in 3 months for left ABI,  left LE and arterial duplex.   Carotid duplex in 6 months, left ICA was 60-79% stenosis in April 2019.   I advised him to notify us if he develops concerns re the circulation in his feet or legs.   Daily seated leg exercises discussed and demonstrated. He needs his prosthesis fitting better in order to walk more.   I discussed in depth with the patient the nature of atherosclerosis, and emphasized the importance of maximal medical management including strict control of blood pressure, blood glucose, and lipid levels, obtaining regular exercise, and continued cessation of smoking.  The patient is aware that without maximal medical management the underlying atherosclerotic disease process will progress, limiting the benefit of any interventions.  The patient was given information about PAD including signs, symptoms, treatment, what symptoms should prompt the patient to seek immediate medical care, and risk reduction measures to take.  Clemon Chambers, RN, MSN, FNP-C [facility] Phone: [phone]  Clinic MD: Trula Slade  03/25/18 11:13 AM

## 2018-03-29 ENCOUNTER — Other Ambulatory Visit: Payer: Self-pay | Admitting: *Deleted

## 2018-03-29 DIAGNOSIS — E1142 Type 2 diabetes mellitus with diabetic polyneuropathy: Secondary | ICD-10-CM

## 2018-03-29 MED ORDER — PIOGLITAZONE HCL 30 MG PO TABS: 30.0000 mg | ORAL_TABLET | Freq: Every day | ORAL | 0 refills | Status: DC

## 2018-03-29 NOTE — Telephone Encounter (Signed)
Next appt scheduled 06/14/18 with PCP. 

## 2018-04-23 ENCOUNTER — Encounter: Payer: Self-pay | Admitting: Gastroenterology

## 2018-04-23 ENCOUNTER — Ambulatory Visit (INDEPENDENT_AMBULATORY_CARE_PROVIDER_SITE_OTHER): Payer: Medicare Other | Admitting: Gastroenterology

## 2018-04-23 ENCOUNTER — Telehealth: Payer: Self-pay

## 2018-04-23 VITALS — BP 160/68 | HR 74 | Ht 73.0 in | Wt 185.5 lb

## 2018-04-23 DIAGNOSIS — Z800 Family history of malignant neoplasm of digestive organs: Secondary | ICD-10-CM | POA: Diagnosis not present

## 2018-04-23 MED ORDER — PEG 3350-KCL-NA BICARB-NACL 420 G PO SOLR: 4000.0000 mL | ORAL | 0 refills | Status: DC

## 2018-04-23 NOTE — Progress Notes (Signed)
Review of pertinent gastrointestinal problems: 1.  Elevated risk for colon cancer, his brother had colon cancer.  Colonoscopy at [facility] many many years ago.  Per patient polyps were found and he was told they were "precancerous".  Colonoscopy October 2013, [name] found a single subcentimeter polyp.  It was not precancerous.  He was recommended to have repeat colonoscopy at 5-year interval given family history.   HPI: This is a very pleasant 67 year old man who was referred to me by Welford Roche,*  to evaluate family history colon cancer.    Chief complaint is family history of colon cancer  I last saw him about 6 years ago at the time of a colonoscopy.  I found a single non-precancerous subcentimeter polyp.  At that time I recommended he have a repeat colonoscopy at about 5 years.  His brother had colon cancer diagnosed in his early 20s.  His brother recently passed away because of recurrence of the colon cancer.  He is having no issues with his bowels.  Specifically no bleeding, no constipation, no significant diarrhea or abdominal pains.  His weight is overall stable.  He takes Plavix daily for peripheral vascular disease.  He has been on this for many many years.  Old Data Reviewed:  Sees vascular surgery for PVD: s/p stent placement of left popliteal artery on 08-17-17 by Dr. Trula Slade for left leg rest pain. He is alsos/pleft SFA stenting in 2010. He is s/p right AKAin 2009.    Review of systems: Pertinent positive and negative review of systems were noted in the above HPI section. All other review negative.   Past Medical History:  Diagnosis Date  . Asthma    per 2003 [facility] pulm records pfts   . Blood dyscrasia    HIV  . Clotting disorder ([place])   . Colon polyps    noted previous colonoscopy [facility]  . DDD (degenerative disc disease)    cervical spine  . Depression   . Diabetes mellitus    dx 2010  . Fever    unknwon origin  . GERD (gastroesophageal reflux  disease)   . Head swelling 05/23/2017  . Hep C w/o coma, chronic ([place])   . History of syphilis    noted [facility] records  . HIV infection ([place])    undetectable viral load and CD4 ct 667 as of 11/2011  . Hyperlipidemia   . Hypertension   . MRSA (methicillin resistant Staphylococcus aureus)   . Multiple sclerosis ([place])    in remission as of 11/2011 (diagnosed late 1980s)  . Pain in limb-Right Leg 10/13/2013  . 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[place])    2002  . Pneumonia   . PVD (peripheral vascular disease) ([place])    Left Stent 07/02/2008  . Scalp lesion 07/26/2017  . UTI (urinary tract infection)   . Wears dentures     Past Surgical History:  Procedure Laterality Date  . ABDOMINAL AORTOGRAM W/LOWER EXTREMITY N/A 08/07/2017   Procedure: ABDOMINAL AORTOGRAM W/LOWER EXTREMITY;  Surgeon: Serafina Mitchell, MD;  Location: [facility];  Service: Cardiovascular;  Laterality: N/A;  . ABOVE KNEE LEG AMPUTATION     Right Leg  . COLONOSCOPY W/ BIOPSIES AND POLYPECTOMY    . LOWER EXTREMITY ANGIOGRAM Left 12/26/2011   Procedure: LOWER EXTREMITY ANGIOGRAM;  Surgeon: Serafina Mitchell, MD;  Location: [facility] CATH LAB;  Service: Cardiovascular;  Laterality: Left;  . LOWER EXTREMITY ANGIOGRAM Left 08/17/2017   Procedure: LOWER EXTREMITY ANGIOGRAM LEFT LEG WITH RUNOFF  AND Stenting.;  Surgeon: Serafina Mitchell, MD;  Location: [facility];  Service: Vascular;  Laterality: Left;  Marland Kitchen MULTIPLE TOOTH EXTRACTIONS    . OTHER SURGICAL HISTORY     right lower ext AKA with prothesis   . OTHER SURGICAL HISTORY     left left with stents (Dr. Seward Speck)  . OTHER SURGICAL HISTORY     2003 colonoscopy 5 mm polyp transverse colon; (2)40m  polyps in rectum-hyperplastic  . PERIPHERAL VASCULAR INTERVENTION Left 08/17/2017   Procedure: POPLITEAL STENT;  Surgeon: BSerafina Mitchell MD;  [facility];  Service: Vascular;  Laterality: Left;    Current Outpatient Medications  Medication Sig Dispense Refill  . atorvastatin  (LIPITOR) 40 MG tablet TAKE 1 TABLET(40 MG) BY MOUTH DAILY 90 tablet 1  . bictegravir-emtricitabine-tenofovir AF (BIKTARVY) 50-200-25 MG TABS tablet Take 1 tablet by mouth daily. 30 tablet 5  . Blood Glucose Monitoring Suppl (BAYER CONTOUR MONITOR) W/DEVICE KIT Use to check blood sugar as instructed up to 4 times a day 1 kit 0  . clopidogrel (PLAVIX) 75 MG tablet TAKE 1 TABLET BY MOUTH DAILY 90 tablet 3  . CONTOUR NEXT TEST test strip USE TO TEST BLOOD SUGAR AS DIRECTED FOUR TIMES DAILY BEFORE MEALS AND AT BEDTIME 375 each 1  . HUMALOG KWIKPEN 100 UNIT/ML KwikPen   11  . ibuprofen (ADVIL,MOTRIN) 200 MG tablet Take 600 mg by mouth 3 (three) times daily as needed for headache or moderate pain.     . Insulin Glargine (LANTUS SOLOSTAR) 100 UNIT/ML Solostar Pen Inject 70 Units into the skin daily at 10 pm. Diagnosis code  E11.42 15 mL 11  . insulin lispro (HUMALOG KWIKPEN) 100 UNIT/ML KiwkPen Inject 0.22 mLs (22 Units total) into the skin 3 (three) times daily with meals. 30 mL 11  . Insulin Pen Needle (BD PEN NEEDLE NANO U/F) 32G X 4 MM MISC USE AS DIRECTED FOUR TIMES DAILY. Dx code  E11.42 100 each 5  . Lancet Devices (BAYER MICROLET 2 LANCING DEVIC) MISC Use to check blood sugar up to 3 ties a day 1 each 2  . lisinopril (PRINIVIL,ZESTRIL) 40 MG tablet TAKE 1 TABLET BY MOUTH DAILY 90 tablet 1  . omeprazole (PRILOSEC) 40 MG capsule TAKE ONE CAPSULE BY MOUTH EVERY DAY 90 capsule 3  . pioglitazone (ACTOS) 30 MG tablet Take 1 tablet (30 mg total) by mouth daily. 90 tablet 0  . zidovudine (RETROVIR) 300 MG tablet TAKE 1 TABLET(300 MG) BY MOUTH TWICE DAILY 60 tablet 5   No current facility-administered medications for this visit.     Allergies as of 04/23/2018 - Review Complete 04/23/2018  Allergen Reaction Noted  . Sulfonamide derivatives Hives 02/12/2006    Family History  Problem Relation Age of Onset  . Diabetes Mother   . Coronary artery disease Mother   . Cancer Brother        colon caner  stage 4 as of 11/2011 (unknown age of onset)  . Coronary artery disease Father   . Prostate cancer Brother     Social History   Socioeconomic History  . Marital status: Single    Spouse name: Not on file  . Number of children: Not on file  . Years of education: 132 . Highest education level: Not on file  Occupational History  . Not on file  Social Needs  . Financial resource strain: Not on file  . Food insecurity:    Worry: Not on file    Inability:  Not on file  . Transportation needs:    Medical: Not on file    Non-medical: Not on file  Tobacco Use  . Smoking status: Former Smoker    Last attempt to quit: 05/09/2007    Years since quitting: 10.9  . Smokeless tobacco: Former Systems developer    Types: Chew  Substance and Sexual Activity  . Alcohol use: No    Alcohol/week: 0.0 standard drinks  . Drug use: No  . Sexual activity: Yes    Partners: Male    Comment: declined condoms  Lifestyle  . Physical activity:    Days per week: Not on file    Minutes per session: Not on file  . Stress: Not on file  Relationships  . Social connections:    Talks on phone: Not on file    Gets together: Not on file    Attends religious service: Not on file    Active member of club or organization: Not on file    Attends meetings of clubs or organizations: Not on file    Relationship status: Not on file  . Intimate partner violence:    Fear of current or ex partner: Not on file    Emotionally abused: Not on file    Physically abused: Not on file    Forced sexual activity: Not on file  Other Topics Concern  . Not on file  Social History Narrative  . Not on file     Physical Exam: BP (!) 160/68   Pulse 74   Ht '6\' 1"'  (1.854 m)   Wt 185 lb 8 oz (84.1 kg)   BMI 24.47 kg/m  Constitutional: generally well-appearing Psychiatric: alert and oriented x3 Eyes: extraocular movements intact Mouth: oral pharynx moist, no lesions Neck: supple no lymphadenopathy Cardiovascular: heart regular rate  and rhythm Lungs: clear to auscultation bilaterally Abdomen: soft, nontender, nondistended, no obvious ascites, no peritoneal signs, normal bowel sounds Extremities: no lower extremity edema bilaterally; right AKA prosthetic Skin: no lesions on visible extremities   Assessment and plan: 67 y.o. male with elevated risk for colon cancer, given family history of colon cancer  His last colonoscopy was 6 years ago.  I recommended we repeat that for him now given his elevated risk for colon cancer.  He is on Plavix chronically for peripheral vascular disease.  I recommended that he hold the medicine for 5 days prior to the colonoscopy.  We will clear this with his vascular surgeon as well.  I see no reason for any further blood tests or imaging studies.    Please see the "Patient Instructions" section for addition details about the plan.   Owens Loffler, MD [facility] 04/23/2018, 9:07 AM  Cc: Welford Roche,*

## 2018-04-23 NOTE — Telephone Encounter (Signed)
[facility] Pre-operative Risk Assessment     Request for surgical clearance:     Endoscopy Procedure  What type of surgery is being performed?     colonoscopy  When is this surgery scheduled?     05/15/18  What type of clearance is required ?   Pharmacy  Are there any medications that need to be held prior to surgery and how long? Plavix  Practice name and name of physician performing surgery?      [facility]  What is your office phone and fax number?      Phone- [phone]  [phone]  Anesthesia type (None, local, MAC, general) ?       MAC

## 2018-04-23 NOTE — Patient Instructions (Addendum)
You will be set up for a colonoscopy for FH of colon cancer. You will need to hold plavix for 5 days prior to the colonoscopy. We will contact your vascular surgeon to make sure they feel that recommendation is safe.  You have been scheduled for a colonoscopy. Please follow written instructions given to you at your visit today.  Please pick up your prep supplies at the pharmacy within the next 1-3 days. If you use inhalers (even only as needed), please bring them with you on the day of your procedure. Your physician has requested that you go to www.startemmi.com and enter the access code given to you at your visit today. This web site gives a general overview about your procedure. However, you should still follow specific instructions given to you by our office regarding your preparation for the procedure.  Thank you for entrusting me with your care and choosing [facility].  Dr Ardis Hughs

## 2018-04-29 ENCOUNTER — Telehealth: Payer: Self-pay

## 2018-04-29 NOTE — Telephone Encounter (Signed)
Ok to hold plavix 

## 2018-04-29 NOTE — Telephone Encounter (Signed)
Please indicate how long patient may hold Plavix

## 2018-04-29 NOTE — Telephone Encounter (Signed)
Spoke to patient to inform him that Dr Trula Slade cleared him to hold Plavix 5 days prior to 05/15/18 procedure. Patient understanding.

## 2018-05-14 ENCOUNTER — Other Ambulatory Visit: Payer: Self-pay | Admitting: *Deleted

## 2018-05-14 DIAGNOSIS — E7800 Pure hypercholesterolemia, unspecified: Secondary | ICD-10-CM

## 2018-05-15 ENCOUNTER — Encounter: Payer: Self-pay | Admitting: Gastroenterology

## 2018-05-15 ENCOUNTER — Ambulatory Visit (AMBULATORY_SURGERY_CENTER): Payer: Medicare Other | Admitting: Gastroenterology

## 2018-05-15 VITALS — BP 111/74 | HR 69 | Temp 97.1°F | Resp 15 | Ht 73.0 in | Wt 185.5 lb

## 2018-05-15 DIAGNOSIS — Z800 Family history of malignant neoplasm of digestive organs: Secondary | ICD-10-CM | POA: Diagnosis not present

## 2018-05-15 DIAGNOSIS — Z86010 Personal history of colonic polyps: Secondary | ICD-10-CM

## 2018-05-15 DIAGNOSIS — E119 Type 2 diabetes mellitus without complications: Secondary | ICD-10-CM | POA: Diagnosis not present

## 2018-05-15 DIAGNOSIS — F329 Major depressive disorder, single episode, unspecified: Secondary | ICD-10-CM | POA: Diagnosis not present

## 2018-05-15 DIAGNOSIS — D123 Benign neoplasm of transverse colon: Secondary | ICD-10-CM | POA: Diagnosis not present

## 2018-05-15 DIAGNOSIS — I10 Essential (primary) hypertension: Secondary | ICD-10-CM | POA: Diagnosis not present

## 2018-05-15 DIAGNOSIS — D122 Benign neoplasm of ascending colon: Secondary | ICD-10-CM

## 2018-05-15 DIAGNOSIS — K219 Gastro-esophageal reflux disease without esophagitis: Secondary | ICD-10-CM | POA: Diagnosis not present

## 2018-05-15 DIAGNOSIS — J45909 Unspecified asthma, uncomplicated: Secondary | ICD-10-CM | POA: Diagnosis not present

## 2018-05-15 DIAGNOSIS — B1920 Unspecified viral hepatitis C without hepatic coma: Secondary | ICD-10-CM | POA: Diagnosis not present

## 2018-05-15 DIAGNOSIS — I739 Peripheral vascular disease, unspecified: Secondary | ICD-10-CM | POA: Diagnosis not present

## 2018-05-15 DIAGNOSIS — G35 Multiple sclerosis: Secondary | ICD-10-CM | POA: Diagnosis not present

## 2018-05-15 DIAGNOSIS — D124 Benign neoplasm of descending colon: Secondary | ICD-10-CM

## 2018-05-15 MED ORDER — SODIUM CHLORIDE 0.9 % IV SOLN: 500.0000 mL | Freq: Once | INTRAVENOUS | Status: DC

## 2018-05-15 MED ORDER — ATORVASTATIN CALCIUM 40 MG PO TABS: ORAL_TABLET | ORAL | 1 refills | Status: DC

## 2018-05-15 NOTE — Patient Instructions (Signed)
Handouts given on polyps You had 4 polyps removed today Resume Plavix tomorrow   YOU HAD AN ENDOSCOPIC PROCEDURE TODAY AT [place]:   Refer to the procedure report that was given to you for any specific questions about what was found during the examination.  If the procedure report does not answer your questions, please call your gastroenterologist to clarify.  If you requested that your care partner not be given the details of your procedure findings, then the procedure report has been included in a sealed envelope for you to review at your convenience later.  YOU SHOULD EXPECT: Some feelings of bloating in the abdomen. Passage of more gas than usual.  Walking can help get rid of the air that was put into your GI tract during the procedure and reduce the bloating. If you had a lower endoscopy (such as a colonoscopy or flexible sigmoidoscopy) you may notice spotting of blood in your stool or on the toilet paper. If you underwent a bowel prep for your procedure, you may not have a normal bowel movement for a few days.  Please Note:  You might notice some irritation and congestion in your nose or some drainage.  This is from the oxygen used during your procedure.  There is no need for concern and it should clear up in a day or so.  SYMPTOMS TO REPORT IMMEDIATELY:   Following lower endoscopy (colonoscopy or flexible sigmoidoscopy):  Excessive amounts of blood in the stool  Significant tenderness or worsening of abdominal pains  Swelling of the abdomen that is new, acute  Fever of 100F or higher   For urgent or emergent issues, a gastroenterologist can be reached at any hour by calling [phone].   DIET:  We do recommend a small meal at first, but then you may proceed to your regular diet.  Drink plenty of fluids but you should avoid alcoholic beverages for 24 hours.  ACTIVITY:  You should plan to take it easy for the rest of today and you should NOT DRIVE or use heavy  machinery until tomorrow (because of the sedation medicines used during the test).    FOLLOW UP: Our staff will call the number listed on your records the next business day following your procedure to check on you and address any questions or concerns that you may have regarding the information given to you following your procedure. If we do not reach you, we will leave a message.  However, if you are feeling well and you are not experiencing any problems, there is no need to return our call.  We will assume that you have returned to your regular daily activities without incident.  If any biopsies were taken you will be contacted by phone or by letter within the next 1-3 weeks.  Please call us at [phone] if you have not heard about the biopsies in 3 weeks.    SIGNATURES/CONFIDENTIALITY: You and/or your care partner have signed paperwork which will be entered into your electronic medical record.  These signatures attest to the fact that that the information above on your After Visit Summary has been reviewed and is understood.  Full responsibility of the confidentiality of this discharge information lies with you and/or your care-partner.

## 2018-05-15 NOTE — Progress Notes (Signed)
Pt's states no medical or surgical changes since previsit or office visit. 

## 2018-05-15 NOTE — Op Note (Signed)
[facility] Patient Name: Nathan [name] Procedure Date: 05/15/2018 1:32 PM MRN: [id number] Endoscopist: Milus Banister , MD Age: 68 Referring MD:  Date of Birth: [birth year] Gender: Male Account #: [id number] Procedure:                Colonoscopy Indications:              Elevated risk for colon cancer, his brother had                            colon cancer. Colonoscopy at [facility] many many years                            ago. Per patient polyps were found and he was told                            they were "precancerous". Colonoscopy October 2013,                            [name] found a single subcentimeter polyp. It                            was not precancerous. Medicines:                Monitored Anesthesia Care Procedure:                Pre-Anesthesia Assessment:                           - Prior to the procedure, a History and Physical                            was performed, and patient medications and                            allergies were reviewed. The patient's tolerance of                            previous anesthesia was also reviewed. The risks                            and benefits of the procedure and the sedation                            options and risks were discussed with the patient.                            All questions were answered, and informed consent                            was obtained. Prior Anticoagulants: The patient has                            taken Plavix (clopidogrel), last dose was 5 days  prior to procedure. ASA Grade Assessment: III - A                            patient with severe systemic disease. After                            reviewing the risks and benefits, the patient was                            deemed in satisfactory condition to undergo the                            procedure.                           After obtaining informed consent, the colonoscope                             was passed under direct vision. Throughout the                            procedure, the patient's blood pressure, pulse, and                            oxygen saturations were monitored continuously. The                            Colonoscope was introduced through the anus and                            advanced to the the cecum, identified by                            appendiceal orifice and ileocecal valve. The                            colonoscopy was performed without difficulty. The                            patient tolerated the procedure well. The quality                            of the bowel preparation was good. The ileocecal                            valve, appendiceal orifice, and rectum were                            photographed. Scope In: 1:51:30 PM Scope Out: 2:04:03 PM Scope Withdrawal Time: 0 hours 9 minutes 30 seconds  Total Procedure Duration: 0 hours 12 minutes 33 seconds  Findings:                 Four sessile polyps were found in the descending  colon, transverse colon and ascending colon. The                            polyps were 3 to 4 mm in size. These polyps were                            removed with a cold snare. Resection and retrieval                            were complete.                           The exam was otherwise without abnormality on                            direct and retroflexion views. Complications:            No immediate complications. Estimated blood loss:                            None. Estimated Blood Loss:     Estimated blood loss: none. Impression:               - Four 3 to 4 mm polyps in the descending colon, in                            the transverse colon and in the ascending colon,                            removed with a cold snare. Resected and retrieved.                           - The examination was otherwise normal on direct                            and retroflexion  views. Recommendation:           - Patient has a contact number available for                            emergencies. The signs and symptoms of potential                            delayed complications were discussed with the                            patient. Return to normal activities tomorrow.                            Written discharge instructions were provided to the                            patient.                           -  Resume previous diet.                           - Continue present medications.                           You will receive a letter within 2-3 weeks with the                            pathology results and my final recommendations.                           If the polyp(s) is proven to be 'pre-cancerous' on                            pathology, you will need repeat colonoscopy in 3-5                            years. Milus Banister, MD 05/15/2018 2:08:23 PM This report has been signed electronically.

## 2018-05-15 NOTE — Progress Notes (Signed)
PT taken to PACU. Monitors in place. VSS. Report given to RN. 

## 2018-05-15 NOTE — Progress Notes (Signed)
Called to room to assist during endoscopic procedure.  Patient ID and intended procedure confirmed with present staff. Received instructions for my participation in the procedure from the performing physician.  

## 2018-05-16 ENCOUNTER — Telehealth: Payer: Self-pay | Admitting: *Deleted

## 2018-05-16 ENCOUNTER — Telehealth: Payer: Self-pay

## 2018-05-16 NOTE — Telephone Encounter (Signed)
No answer on f/u call. No voicemail.

## 2018-05-16 NOTE — Telephone Encounter (Signed)
Second attempt, left VM.  

## 2018-05-17 ENCOUNTER — Other Ambulatory Visit: Payer: Self-pay | Admitting: *Deleted

## 2018-05-17 ENCOUNTER — Other Ambulatory Visit: Payer: Self-pay

## 2018-05-17 DIAGNOSIS — I779 Disorder of arteries and arterioles, unspecified: Secondary | ICD-10-CM

## 2018-05-17 DIAGNOSIS — E1142 Type 2 diabetes mellitus with diabetic polyneuropathy: Secondary | ICD-10-CM

## 2018-05-17 MED ORDER — INSULIN GLARGINE 100 UNIT/ML SOLOSTAR PEN: 70.0000 [IU] | PEN_INJECTOR | Freq: Every day | SUBCUTANEOUS | 11 refills | Status: DC

## 2018-05-17 NOTE — Telephone Encounter (Signed)
Next appt scheduled 06/14/18 with PCP. 

## 2018-05-23 ENCOUNTER — Encounter: Payer: Self-pay | Admitting: Gastroenterology

## 2018-06-14 ENCOUNTER — Other Ambulatory Visit: Payer: Self-pay

## 2018-06-14 ENCOUNTER — Encounter: Payer: Self-pay | Admitting: Internal Medicine

## 2018-06-14 ENCOUNTER — Ambulatory Visit (INDEPENDENT_AMBULATORY_CARE_PROVIDER_SITE_OTHER): Payer: Medicare Other | Admitting: Internal Medicine

## 2018-06-14 VITALS — BP 144/62 | HR 66 | Temp 97.6°F | Ht 73.0 in | Wt 185.9 lb

## 2018-06-14 DIAGNOSIS — I779 Disorder of arteries and arterioles, unspecified: Secondary | ICD-10-CM

## 2018-06-14 DIAGNOSIS — E1165 Type 2 diabetes mellitus with hyperglycemia: Secondary | ICD-10-CM | POA: Diagnosis not present

## 2018-06-14 DIAGNOSIS — B20 Human immunodeficiency virus [HIV] disease: Secondary | ICD-10-CM | POA: Diagnosis not present

## 2018-06-14 DIAGNOSIS — Z794 Long term (current) use of insulin: Secondary | ICD-10-CM | POA: Diagnosis not present

## 2018-06-14 DIAGNOSIS — Z79899 Other long term (current) drug therapy: Secondary | ICD-10-CM

## 2018-06-14 DIAGNOSIS — E1142 Type 2 diabetes mellitus with diabetic polyneuropathy: Secondary | ICD-10-CM | POA: Diagnosis not present

## 2018-06-14 DIAGNOSIS — E785 Hyperlipidemia, unspecified: Secondary | ICD-10-CM

## 2018-06-14 DIAGNOSIS — D126 Benign neoplasm of colon, unspecified: Secondary | ICD-10-CM

## 2018-06-14 DIAGNOSIS — I10 Essential (primary) hypertension: Secondary | ICD-10-CM | POA: Diagnosis not present

## 2018-06-14 DIAGNOSIS — E1169 Type 2 diabetes mellitus with other specified complication: Secondary | ICD-10-CM

## 2018-06-14 LAB — GLUCOSE, CAPILLARY: GLUCOSE-CAPILLARY: 189 mg/dL — AB (ref 70–99)

## 2018-06-14 LAB — POCT GLYCOSYLATED HEMOGLOBIN (HGB A1C): HEMOGLOBIN A1C: 6.4 % — AB (ref 4.0–5.6)

## 2018-06-14 MED ORDER — LIRAGLUTIDE 18 MG/3ML ~~LOC~~ SOPN: PEN_INJECTOR | SUBCUTANEOUS | 3 refills | Status: DC

## 2018-06-14 NOTE — Patient Instructions (Signed)
Mr. Bartolomei,   I refilled your Victoza at the same dose you have been taking at home. Continue using Lantus, Novolog, and pioglitazone as usual.   I placed an order for your diabetic shoes. Let us know if you fo not get contacted for this.   Make a follow up appointment with me in 3 months.   Call us if you have any questions or concerns.   - Dr. Frederico Hamman

## 2018-06-14 NOTE — Assessment & Plan Note (Signed)
Patient presents for T2DM follow up.  Three months ago his A1c was 5.6 and we discontinued his Victoza (continue Lantus 70, NovoLog 22 TID, and pioglitazone).  He presents today reporting his blood glucose increased to 250-280s 3-4 days after stopping Victoza.  He had leftover Victoza pens at home continue taking it until 2 days ago when he ran out of them.  Blood glucose today is 189 and repeat A1c 6.4. BG meter reviewed, on target 64% of the time (previously 92%).  No episodes of hypoglycemia noted and patient reports no symptoms of hypoglycemia either.  Patient is hesitant to make changes in diabetic medications today.  Therefore we will restart Victoza at 1.8 mg daily and continue Lantus, NovoLog, pioglitazone at current doses.  If he remains well controlled in 3 months, I would discontinue pioglitazone and consider titrating down the Lantus.  If these changes lead to further episodes of hyperglycemia in the future would favor CGM placement.

## 2018-06-14 NOTE — Assessment & Plan Note (Signed)
Patient underwent colonoscopy recently on 05/2018.  It showed four 3-4 mm polyps in the ascending, transverse, and descending colon.  Pathology reviewed, polyps were tubular adenomas without high-grade dysplasia or malignancy and 1 of them was hyperplastic polyp.  GI  recommended repeat colonoscopy in 3 years.

## 2018-06-14 NOTE — Assessment & Plan Note (Signed)
Well-controlled on Biktarvy and zidovudine.  He is compliant with his medications.  Viral load undetectable and CD4 650 on 01/2018.  Follows up with [facility].

## 2018-06-14 NOTE — Assessment & Plan Note (Signed)
Compliant with atorvastatin 40 mg daily.  Will continue.

## 2018-06-14 NOTE — Assessment & Plan Note (Addendum)
Blood pressure today 160/63 and 144/62 when repeated.  This is very unusual for Nathan [name].  He has been well controlled on lisinopril 40 mg daily for a long time.  When asked about this he reports drinking 2 cups of coffee just prior to today's visit as well as an apple sprinkled with salt.  I will not be making any adjustments today and have asked him to come back in 3 months for diabetes follow-up at which time we will recheck his blood pressure.  Okay to him about importance of low salt intake given history of hypertension.  Advised him not to drink any caffeinated drinks prior to clinic visits.

## 2018-06-14 NOTE — Progress Notes (Signed)
   CC: T2DM follow up  HPI:  [name] is a 68 y.o. year-old male with PMH listed below who presents to clinic for T2DM follow up. Please see problem based assessment and plan for further details.   Past Medical History:  Diagnosis Date  . Asthma    per 2003 [facility] pulm records pfts   . Blood dyscrasia    HIV  . Clotting disorder ([place])   . Colon polyps    noted previous colonoscopy [facility]  . DDD (degenerative disc disease)    cervical spine  . Depression   . Diabetes mellitus    dx 2010  . Fever    [place] origin  . GERD (gastroesophageal reflux disease)   . Head swelling 05/23/2017  . Hep C w/o coma, chronic ([place])   . History of syphilis    noted [facility] records  . HIV infection ([place])    undetectable viral load and CD4 ct 667 as of 11/2011  . Hyperlipidemia   . Hypertension   . MRSA (methicillin resistant Staphylococcus aureus)   . Multiple sclerosis ([place])    in remission as of 11/2011 (diagnosed late 1980s)  . Pain in limb-Right Leg 10/13/2013  . 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 Pneumonia   . PVD (peripheral vascular disease) ([place])    Left Stent 07/02/2008  . Scalp lesion 07/26/2017  . UTI (urinary tract infection)   . Wears dentures    Review of Systems:   Review of Systems  Constitutional: Negative for chills, fever, malaise/fatigue and weight loss.  Respiratory: Negative for cough and shortness of breath.   Cardiovascular: Negative for chest pain, palpitations and leg swelling.  Gastrointestinal: Negative for abdominal pain, blood in stool, constipation, diarrhea, melena, nausea and vomiting.  Genitourinary: Negative for frequency and urgency.  Musculoskeletal: Negative for falls, joint pain and myalgias.  Neurological: Negative for dizziness, sensory change, focal weakness and headaches.    Physical Exam: Vitals:   06/14/18 1440 06/14/18 1524  BP: (!) 160/63 (!) 144/62  Pulse: 70 66  Temp: 97.6 F (36.4 C)   TempSrc: Oral     SpO2: 96%   Weight: 185 lb 14.4 oz (84.3 kg)   Height: 6\' 1"  (1.854 m)     General: Well-appearing male in no acute distress HENT: neck supple and FROM, MMM, OP clear without exudates or erythema Cardiac: regular rate and rhythm, nl S1/S2, no murmurs, rubs or gallops Pulm: CTAB, no wheezes or crackles, no increased work of breathing on room air  Abd: soft, NTND, bowel sounds present Ext: warm and well perfused, no peripheral edema, s/p R AKA        Office Visit from 06/14/2018 in [place]  PHQ-9 Total Score  0      Assessment & Plan:   See Encounters Tab for problem based charting.  Patient discussed with Dr. Dareen Piano

## 2018-06-17 NOTE — Progress Notes (Signed)
Internal Medicine Clinic Attending  Case discussed with Dr. Santos-Sanchez at the time of the visit.  We reviewed the resident's history and exam and pertinent patient test results.  I agree with the assessment, diagnosis, and plan of care documented in the resident's note.    

## 2018-06-18 ENCOUNTER — Other Ambulatory Visit: Payer: Self-pay | Admitting: Internal Medicine

## 2018-06-18 DIAGNOSIS — E1165 Type 2 diabetes mellitus with hyperglycemia: Principal | ICD-10-CM

## 2018-06-18 DIAGNOSIS — IMO0002 Reserved for concepts with insufficient information to code with codable children: Secondary | ICD-10-CM

## 2018-06-18 DIAGNOSIS — E1142 Type 2 diabetes mellitus with diabetic polyneuropathy: Secondary | ICD-10-CM

## 2018-06-18 MED ORDER — GLUCOSE BLOOD VI STRP: 1.0000 | ORAL_STRIP | Freq: Four times a day (QID) | 1 refills | Status: DC

## 2018-06-18 NOTE — Telephone Encounter (Signed)
Needs refill on his diabetic strip, per pharmacy said they need more information; pt has been requesting strips since last week, [phone]

## 2018-06-24 NOTE — Progress Notes (Signed)
HISTORY AND PHYSICAL     CC:  follow up.   HPI: This is a 68 y.o. male who is here today for follow up.  He has hx of left popliteal artery stent placement in April 2019 by Dr. Trula Slade.  He has hx of left SFA stenting in 2010 and right AKA in 2009.   He has known peripheral neurophay in the left foot without change over many years.  He continues to be independent with mobility and ADL's with the use of a prothesis on the right LE.   He also has 60-79% bilateral carotid artery stenosis at his last visit and was asymptomatic.  He did have some bilateral eye blurriness that resolved with blinking for several years.   At his last visit, he was doing well with is right leg prosthesis.  His left calf pain had improved and did not have any non healing wounds.    He has a hx of MS that has been in remission for years.    The pt returns today for f/u ABI's and left LE duplex for SFA sten and popliteal stent.  The pt is on a statin for cholesterol management.    The pt does have diabetes. The pt is on ACEI for hypertension.  The pt  on an Plavix.  Tobacco hx:  Remote-quit 2009   Past Medical History:  Diagnosis Date  . Asthma    per 2003 [facility] pulm records pfts   . Blood dyscrasia    HIV  . Clotting disorder ([place])   . Colon polyps    noted previous colonoscopy [facility]  . DDD (degenerative disc disease)    cervical spine  . Depression   . Diabetes mellitus    dx 2010  . Fever    unknwon origin  . GERD (gastroesophageal reflux disease)   . Head swelling 05/23/2017  . Hep C w/o coma, chronic ([place])   . History of syphilis    noted [facility] records  . HIV infection ([place])    undetectable viral load and CD4 ct 667 as of 11/2011  . Hyperlipidemia   . Hypertension   . MRSA (methicillin resistant Staphylococcus aureus)   . Multiple sclerosis ([place])    in remission as of 11/2011 (diagnosed late 1980s)  . Pain in limb-Right Leg 10/13/2013  . 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[place])    2002    . Pneumonia   . PVD (peripheral vascular disease) ([place])    Left Stent 07/02/2008  . Scalp lesion 07/26/2017  . UTI (urinary tract infection)   . Wears dentures     Past Surgical History:  Procedure Laterality Date  . ABDOMINAL AORTOGRAM W/LOWER EXTREMITY N/A 08/07/2017   Procedure: ABDOMINAL AORTOGRAM W/LOWER EXTREMITY;  Surgeon: Serafina Mitchell, MD;  Location: [place] [facility];  Service: Cardiovascular;  Laterality: N/A;  . ABOVE KNEE LEG AMPUTATION     Right Leg  . COLONOSCOPY W/ BIOPSIES AND POLYPECTOMY    . LOWER EXTREMITY ANGIOGRAM Left 12/26/2011   Procedure: LOWER EXTREMITY ANGIOGRAM;  Surgeon: Serafina Mitchell, MD;  Location: [facility] CATH LAB;  Service: Cardiovascular;  Laterality: Left;  . LOWER EXTREMITY ANGIOGRAM Left 08/17/2017   Procedure: LOWER EXTREMITY ANGIOGRAM LEFT LEG WITH RUNOFF AND Stenting.;  Surgeon: Serafina Mitchell, MD;  Location: [place];  Service: Vascular;  Laterality: Left;  Marland Kitchen MULTIPLE TOOTH EXTRACTIONS    . OTHER SURGICAL HISTORY     right lower ext AKA with prothesis   . OTHER SURGICAL  HISTORY     left left with stents (Dr. Seward Speck)  . OTHER SURGICAL HISTORY     2003 colonoscopy 5 mm polyp transverse colon; (2)80m  polyps in rectum-hyperplastic  . PERIPHERAL VASCULAR INTERVENTION Left 08/17/2017   Procedure: POPLITEAL STENT;  Surgeon: BSerafina Mitchell MD;  Location: [place]  Service: Vascular;  Laterality: Left;    Allergies  Allergen Reactions  . Sulfonamide Derivatives Hives    Current Outpatient Medications  Medication Sig Dispense Refill  . atorvastatin (LIPITOR) 40 MG tablet TAKE 1 TABLET(40 MG) BY MOUTH DAILY 90 tablet 1  . bictegravir-emtricitabine-tenofovir AF (BIKTARVY) 50-200-25 MG TABS tablet Take 1 tablet by mouth daily. 30 tablet 5  . Blood Glucose Monitoring Suppl (BAYER CONTOUR MONITOR) W/DEVICE KIT Use to check blood sugar as instructed up to 4 times a day 1 kit 0  . clopidogrel (PLAVIX) 75 MG tablet TAKE 1 TABLET BY MOUTH DAILY 90  tablet 3  . glucose blood (CONTOUR NEXT TEST) test strip 1 each by Other route 4 (four) times daily. Use 4 times daily to check blood sugar. diag code E11.42. 375 each 1  . HUMALOG KWIKPEN 100 UNIT/ML KwikPen   11  . ibuprofen (ADVIL,MOTRIN) 200 MG tablet Take 600 mg by mouth 3 (three) times daily as needed for headache or moderate pain.     . Insulin Glargine (LANTUS SOLOSTAR) 100 UNIT/ML Solostar Pen Inject 70 Units into the skin daily at 10 pm. Diagnosis code  E11.42 15 mL 11  . insulin lispro (HUMALOG KWIKPEN) 100 UNIT/ML KiwkPen Inject 0.22 mLs (22 Units total) into the skin 3 (three) times daily with meals. 30 mL 11  . Insulin Pen Needle (BD PEN NEEDLE NANO U/F) 32G X 4 MM MISC USE AS DIRECTED FOUR TIMES DAILY. Dx code  E11.42 100 each 5  . Lancet Devices (BAYER MICROLET 2 LANCING DEVIC) MISC Use to check blood sugar up to 3 ties a day 1 each 2  . liraglutide (VICTOZA) 18 MG/3ML SOPN ADMINISTER 1.8 MG UNDER THE SKIN EVERY MORNING 9 mL 3  . lisinopril (PRINIVIL,ZESTRIL) 40 MG tablet TAKE 1 TABLET BY MOUTH DAILY 90 tablet 1  . omeprazole (PRILOSEC) 40 MG capsule TAKE ONE CAPSULE BY MOUTH EVERY DAY 90 capsule 3  . pioglitazone (ACTOS) 30 MG tablet Take 1 tablet (30 mg total) by mouth daily. 90 tablet 0  . zidovudine (RETROVIR) 300 MG tablet TAKE 1 TABLET(300 MG) BY MOUTH TWICE DAILY 60 tablet 5   No current facility-administered medications for this visit.     Family History  Problem Relation Age of Onset  . Diabetes Mother   . Coronary artery disease Mother   . Cancer Brother        colon caner stage 4 as of 11/2011 (unknown age of onset)  . Coronary artery disease Father   . Prostate cancer Brother   . Colon cancer Brother   . Rectal cancer Neg Hx     Social History   Socioeconomic History  . Marital status: Single    Spouse name: Not on file  . Number of children: Not on file  . Years of education: 141 . Highest education level: Not on file  Occupational History  . Not on  file  Social Needs  . Financial resource strain: Not on file  . Food insecurity:    Worry: Not on file    Inability: Not on file  . Transportation needs:    Medical: Not on file  Non-medical: Not on file  Tobacco Use  . Smoking status: Former Smoker    Last attempt to quit: 05/09/2007    Years since quitting: 11.1  . Smokeless tobacco: Former Systems developer    Types: Chew  Substance and Sexual Activity  . Alcohol use: No    Alcohol/week: 0.0 standard drinks  . Drug use: No  . Sexual activity: Yes    Partners: Male    Comment: declined condoms  Lifestyle  . Physical activity:    Days per week: Not on file    Minutes per session: Not on file  . Stress: Not on file  Relationships  . Social connections:    Talks on phone: Not on file    Gets together: Not on file    Attends religious service: Not on file    Active member of club or organization: Not on file    Attends meetings of clubs or organizations: Not on file    Relationship status: Not on file  . Intimate partner violence:    Fear of current or ex partner: Not on file    Emotionally abused: Not on file    Physically abused: Not on file    Forced sexual activity: Not on file  Other Topics Concern  . Not on file  Social History Narrative  . Not on file     REVIEW OF SYSTEMS:   _0  denotes positive finding, _1  denotes negative finding Cardiac  Comments:  Chest pain or chest pressure:    Shortness of breath upon exertion:    Short of breath when lying flat:    Irregular heart rhythm:        Vascular    Pain in calf, thigh, or hip brought on by ambulation:    Pain in feet at night that wakes you up from your sleep:     Blood clot in your veins:    Leg swelling:         Pulmonary    Oxygen at home:    Productive cough:     Wheezing:         Neurologic    Sudden weakness in arms or legs:     Sudden numbness in arms or legs:     Sudden onset of difficulty speaking or slurred speech:    Temporary loss of vision  in one eye:     Problems with dizziness:         Gastrointestinal    Blood in stool:     Vomited blood:         Genitourinary    Burning when urinating:     Blood in urine:        Psychiatric    Major depression:         Hematologic    Bleeding problems:    Problems with blood clotting too easily:        Skin    Rashes or ulcers:        Constitutional    Fever or chills:      PHYSICAL EXAMINATION:    General:  WDWN in NAD; vital signs documented above Gait: Not observed HENT: WNL, normocephalic Pulmonary: normal non-labored breathing , without Rales, rhonchi,  wheezing Cardiac: regular HR, without  Murmurs; without carotid bruit Abdomen: soft, NT, no masses Skin: without rashes Vascular Exam/Pulses:palpable radial and brachial pulses B, palpable femoral pulses B, left pedal pulses not palpable  Extremities: without ischemic changes, without Gangrene , without cellulitis; without  open wounds;  Musculoskeletal: no muscle wasting or atrophy  Neurologic: A&O X 3;  No focal weakness or paresthesias are detected Psychiatric:  The pt has Normal affect.   Non-Invasive Vascular Imaging:   ABI's/TBI's on 06/25/2018: ABI Findings: +--------+------------------+-----+--------+--------+ Right   Rt Pressure (mmHg)IndexWaveformComment  +--------+------------------+-----+--------+--------+ [id number]                                     +--------+------------------+-----+--------+--------+  +---------+------------------+-----+--------+-------+ Left     Lt Pressure (mmHg)IndexWaveformComment +---------+------------------+-----+--------+-------+ Brachial 161                                    +---------+------------------+-----+--------+-------+ PTA      115               0.71 biphasic        +---------+------------------+-----+--------+-------+ DP       121               0.75 biphasic         +---------+------------------+-----+--------+-------+ Great Toe70                0.43                 +---------+------------------+-----+--------+-------+  +-------+-----------+-----------+------------+------------+ ABI/TBIToday's ABIToday's TBIPrevious ABIPrevious TBI +-------+-----------+-----------+------------+------------+ Right  AKA        AKA        AKA         AKA          +-------+-----------+-----------+------------+------------+ Left   0.75       0.43       0.79        0.44         +-------+-----------+-----------+------------+------------+   Arterial duplex on 06/25/2018:  Left Duplex Findings: +----------+--------+-----+---------------+--------+----------------+           PSV cm/sRatioStenosis       WaveformComments         +----------+--------+-----+---------------+--------+----------------+ SFA Mid   89                                                   +----------+--------+-----+---------------+--------+----------------+ SFA Distal190          50-74% stenosisbiphasiclow end of range +----------+--------+-----+---------------+--------+----------------+  A focal velocity elevation of 190 cm/s was obtained at distal SFA with a VR of 2.1. Findings are characteristic of 50-74% stenosis.   Left Stent(s): +---------------+---+---------------+--------++ Prox to Stent  120                        +---------------+---+---------------+--------++ Proximal Stent 20450-99% stenosisbiphasic +---------------+---+---------------+--------++ Mid Stent      159                        +---------------+---+---------------+--------++ Distal Stent   190                        +---------------+---+---------------+--------++ Distal to Stent89                         +---------------+---+---------------+--------++      Summary: Left: 50-74% stenosis noted in the superficial femoral artery. Patent  popliteal artery  stent with elevated velocities observed in the proximal segment suggestive of a 50-99% stenosis.    Carotid duplex 03/25/18: Right:  40-59% stenosis Left:  40-59% stenosis    ASSESSMENT/PLAN:: 68 y.o. male here for follow up for hx of left popliteal artery stent placement in April 2019 by Dr. Trula Slade.  He has hx of left SFA stenting in 2010 and right AKA in 2009.   He also has 60-79% bilateral carotid artery stenosis at his last visit and was asymptomatic.   There is no significant changes to his ABI's on the left LE. The popliteal stent has elevated velocities suggested of stenosis, it is 204 cm/sec in the proximal stent.  He remain asymptomatic without claudication, non healing wounds or rest pain.    I have scheduled him for a 9 month f/u with repeat arterial bypass duplex, ABI's and repeat carotid duplex.  If he has problems or concerns he will call sooner.    Roxy Horseman , PA-C [facility] [phone]  Clinic MD:   Early

## 2018-06-25 ENCOUNTER — Ambulatory Visit (INDEPENDENT_AMBULATORY_CARE_PROVIDER_SITE_OTHER)
Admission: RE | Admit: 2018-06-25 | Discharge: 2018-06-25 | Disposition: A | Discharge status: Home / Self Care | Payer: Medicare Other | Source: Ambulatory Visit | Attending: Vascular Surgery | Admitting: Vascular Surgery

## 2018-06-25 ENCOUNTER — Encounter: Payer: Self-pay | Admitting: Family

## 2018-06-25 ENCOUNTER — Ambulatory Visit (HOSPITAL_COMMUNITY)
Admission: RE | Admit: 2018-06-25 | Discharge: 2018-06-25 | Disposition: A | Discharge status: Home / Self Care | Payer: Medicare Other | Source: Ambulatory Visit | Attending: Vascular Surgery | Admitting: Vascular Surgery

## 2018-06-25 ENCOUNTER — Ambulatory Visit (INDEPENDENT_AMBULATORY_CARE_PROVIDER_SITE_OTHER): Payer: Medicare Other | Admitting: Physician Assistant

## 2018-06-25 ENCOUNTER — Other Ambulatory Visit: Payer: Self-pay

## 2018-06-25 VITALS — BP 172/74 | HR 66 | Temp 96.9°F | Resp 16 | Ht 73.0 in | Wt 185.3 lb

## 2018-06-25 DIAGNOSIS — I779 Disorder of arteries and arterioles, unspecified: Secondary | ICD-10-CM

## 2018-06-25 DIAGNOSIS — I739 Peripheral vascular disease, unspecified: Secondary | ICD-10-CM | POA: Diagnosis not present

## 2018-07-02 ENCOUNTER — Encounter: Payer: Self-pay | Admitting: Infectious Disease

## 2018-07-04 ENCOUNTER — Other Ambulatory Visit: Payer: Self-pay | Admitting: *Deleted

## 2018-07-04 DIAGNOSIS — E1142 Type 2 diabetes mellitus with diabetic polyneuropathy: Secondary | ICD-10-CM

## 2018-07-04 MED ORDER — PIOGLITAZONE HCL 30 MG PO TABS: 30.0000 mg | ORAL_TABLET | Freq: Every day | ORAL | 0 refills | Status: DC

## 2018-07-04 NOTE — Telephone Encounter (Signed)
Next appt scheduled 09/13/18 w/PCP.

## 2018-07-11 ENCOUNTER — Other Ambulatory Visit: Payer: Medicare Other

## 2018-07-15 ENCOUNTER — Other Ambulatory Visit: Payer: Medicare Other

## 2018-07-15 DIAGNOSIS — B20 Human immunodeficiency virus [HIV] disease: Secondary | ICD-10-CM

## 2018-07-17 LAB — RPR: RPR Ser Ql: NONREACTIVE

## 2018-07-17 LAB — HIV-1 RNA QUANT-NO REFLEX-BLD
HIV 1 RNA Quant: <20 copies/mL
HIV-1 RNA Quant, Log: <1.3 Log copies/mL

## 2018-07-17 LAB — T-HELPER CELL (CD4) - (RCID CLINIC ONLY)
CD4 % Helper T Cell: 23 % — ABNORMAL LOW (ref 33–55)
CD4 T Cell Abs: 770 /uL (ref 400–2700)

## 2018-07-25 ENCOUNTER — Encounter: Payer: Medicare Other | Admitting: Infectious Disease

## 2018-08-02 ENCOUNTER — Encounter: Payer: Medicare Other | Admitting: Infectious Disease

## 2018-08-04 ENCOUNTER — Other Ambulatory Visit: Payer: Self-pay | Admitting: Internal Medicine

## 2018-08-11 ENCOUNTER — Other Ambulatory Visit: Payer: Self-pay | Admitting: Infectious Disease

## 2018-08-11 DIAGNOSIS — B20 Human immunodeficiency virus [HIV] disease: Secondary | ICD-10-CM

## 2018-08-12 ENCOUNTER — Other Ambulatory Visit: Payer: Self-pay | Admitting: *Deleted

## 2018-08-12 DIAGNOSIS — R64 Cachexia: Principal | ICD-10-CM

## 2018-08-12 DIAGNOSIS — B20 Human immunodeficiency virus [HIV] disease: Secondary | ICD-10-CM

## 2018-08-12 MED ORDER — BICTEGRAVIR-EMTRICITAB-TENOFOV 50-200-25 MG PO TABS: 1.0000 | ORAL_TABLET | Freq: Every day | ORAL | 5 refills | Status: DC

## 2018-08-12 MED ORDER — ZIDOVUDINE 300 MG PO TABS: ORAL_TABLET | ORAL | 5 refills | Status: DC

## 2018-08-13 ENCOUNTER — Other Ambulatory Visit: Payer: Self-pay | Admitting: Internal Medicine

## 2018-09-13 ENCOUNTER — Encounter: Payer: Medicare Other | Admitting: Internal Medicine

## 2018-09-13 ENCOUNTER — Other Ambulatory Visit: Payer: Self-pay

## 2018-09-15 ENCOUNTER — Telehealth: Payer: Self-pay | Admitting: Internal Medicine

## 2018-09-15 NOTE — Telephone Encounter (Signed)
Called patient several times for telehealth appointment. No answer. Left a voice message to return call.

## 2018-09-23 ENCOUNTER — Telehealth: Payer: Self-pay

## 2018-09-23 ENCOUNTER — Other Ambulatory Visit: Payer: Self-pay

## 2018-09-23 ENCOUNTER — Other Ambulatory Visit: Payer: Self-pay | Admitting: *Deleted

## 2018-09-23 DIAGNOSIS — IMO0002 Reserved for concepts with insufficient information to code with codable children: Secondary | ICD-10-CM

## 2018-09-23 DIAGNOSIS — E1142 Type 2 diabetes mellitus with diabetic polyneuropathy: Secondary | ICD-10-CM

## 2018-09-23 MED ORDER — GLUCOSE BLOOD VI STRP: 1.0000 | ORAL_STRIP | Freq: Four times a day (QID) | 1 refills | Status: DC

## 2018-09-23 NOTE — Telephone Encounter (Signed)
glucose blood (CONTOUR NEXT TEST) test strip   Refill request @  [id number] - [place], [place] [phone] (Phone) [phone] (Fax)

## 2018-09-23 NOTE — Telephone Encounter (Signed)
Spoke w/ pharm, they are refilling and will call pt

## 2018-09-23 NOTE — Telephone Encounter (Signed)
Requesting to speak with a nurse about getting diabetic shoes. Please call back.

## 2018-10-07 ENCOUNTER — Other Ambulatory Visit: Payer: Self-pay | Admitting: *Deleted

## 2018-10-07 DIAGNOSIS — E1142 Type 2 diabetes mellitus with diabetic polyneuropathy: Secondary | ICD-10-CM

## 2018-10-08 MED ORDER — PIOGLITAZONE HCL 30 MG PO TABS: 30.0000 mg | ORAL_TABLET | Freq: Every day | ORAL | 0 refills | Status: DC

## 2018-10-15 NOTE — Telephone Encounter (Signed)
Received order form for diabetic shoes from [facility]. Placed in PCP's box for completion and signatures. Patient will need a more recent foot exam. (within 6 months of order) and request for diabetic shoes will need to be addressed in F2F visit with Provider. [name], RN, BSN

## 2018-10-18 NOTE — Telephone Encounter (Signed)
Thank you :)

## 2018-10-21 ENCOUNTER — Other Ambulatory Visit: Payer: Self-pay | Admitting: *Deleted

## 2018-10-21 DIAGNOSIS — E1142 Type 2 diabetes mellitus with diabetic polyneuropathy: Secondary | ICD-10-CM

## 2018-10-21 MED ORDER — VICTOZA 18 MG/3ML ~~LOC~~ SOPN: PEN_INJECTOR | SUBCUTANEOUS | 3 refills | Status: DC

## 2018-10-22 NOTE — Telephone Encounter (Signed)
Completed and signed order form for diabetic shoes faxed to [facility] at [phone]. [name], RN, BSN

## 2018-10-25 ENCOUNTER — Other Ambulatory Visit: Payer: Self-pay

## 2018-10-25 DIAGNOSIS — Z20822 Contact with and (suspected) exposure to covid-19: Secondary | ICD-10-CM

## 2018-10-25 DIAGNOSIS — R6889 Other general symptoms and signs: Secondary | ICD-10-CM | POA: Diagnosis not present

## 2018-10-27 LAB — NOVEL CORONAVIRUS, NAA: SARS-CoV-2, NAA: NOT DETECTED

## 2018-11-01 NOTE — Telephone Encounter (Signed)
Confirmed with [facility] that they received the completed and signed form for diabetic shoes. No further action needed at this time. [name], RN, BSN

## 2018-11-04 ENCOUNTER — Encounter: Payer: Medicare Other | Admitting: Internal Medicine

## 2018-11-11 ENCOUNTER — Other Ambulatory Visit: Payer: Self-pay

## 2018-11-11 ENCOUNTER — Encounter: Payer: Self-pay | Admitting: Internal Medicine

## 2018-11-11 ENCOUNTER — Ambulatory Visit (INDEPENDENT_AMBULATORY_CARE_PROVIDER_SITE_OTHER): Payer: Medicare Other | Admitting: Internal Medicine

## 2018-11-11 VITALS — BP 116/54 | HR 79 | Temp 97.6°F | Ht 73.0 in | Wt 179.3 lb

## 2018-11-11 DIAGNOSIS — Z794 Long term (current) use of insulin: Secondary | ICD-10-CM

## 2018-11-11 DIAGNOSIS — Z89611 Acquired absence of right leg above knee: Secondary | ICD-10-CM

## 2018-11-11 DIAGNOSIS — E1142 Type 2 diabetes mellitus with diabetic polyneuropathy: Secondary | ICD-10-CM

## 2018-11-11 DIAGNOSIS — Z87891 Personal history of nicotine dependence: Secondary | ICD-10-CM

## 2018-11-11 DIAGNOSIS — Z7902 Long term (current) use of antithrombotics/antiplatelets: Secondary | ICD-10-CM | POA: Diagnosis not present

## 2018-11-11 DIAGNOSIS — Z79899 Other long term (current) drug therapy: Secondary | ICD-10-CM | POA: Diagnosis not present

## 2018-11-11 DIAGNOSIS — I10 Essential (primary) hypertension: Secondary | ICD-10-CM | POA: Diagnosis not present

## 2018-11-11 LAB — POCT GLYCOSYLATED HEMOGLOBIN (HGB A1C): Hemoglobin A1C: 7.4 % — AB (ref 4.0–5.6)

## 2018-11-11 LAB — GLUCOSE, CAPILLARY: Glucose-Capillary: 176 mg/dL — ABNORMAL HIGH (ref 70–99)

## 2018-11-11 MED ORDER — TOUJEO MAX SOLOSTAR 300 UNIT/ML ~~LOC~~ SOPN: 70.0000 [IU] | PEN_INJECTOR | Freq: Every day | SUBCUTANEOUS | 3 refills | Status: DC

## 2018-11-11 NOTE — Patient Instructions (Signed)
Nathan [name],   Please stop using Lantus and start taking Toujeo 70 units at night.  Continue Humalog, Trulicity, and pioglitazone as usual.  Please make a follow-up appointment with me in 4 weeks to make sure you are not having low blood sugar episodes.  Please call us if you have any questions or concerns.  -Dr. Frederico Hamman

## 2018-11-11 NOTE — Progress Notes (Signed)
74

## 2018-11-13 ENCOUNTER — Encounter: Payer: Self-pay | Admitting: Internal Medicine

## 2018-11-13 ENCOUNTER — Other Ambulatory Visit: Payer: Self-pay | Admitting: *Deleted

## 2018-11-13 DIAGNOSIS — E7800 Pure hypercholesterolemia, unspecified: Secondary | ICD-10-CM

## 2018-11-13 MED ORDER — ATORVASTATIN CALCIUM 40 MG PO TABS: ORAL_TABLET | ORAL | 1 refills | Status: DC

## 2018-11-13 NOTE — Assessment & Plan Note (Signed)
Patient presents for T2DM follow up. He is on Lantus 70 units QHS, Humalog 22U TID with meals, Victoza, and Actos. He is overall well-controlled but has been experiencing hyperglycemia in the morning 200-270s. A1c has been slowly uptrending 6-> 6.4-> 7.4. Will change his long acting insulin to Toujeo for longer duration and better glycemic control.  - Toujeou 70 units QHS  - Continue current dose of Humalog, Actos, and Victoza  - I placed an order for DM shoes. He has R AKA as complication of DM and would benefit from DM shoes

## 2018-11-13 NOTE — Progress Notes (Signed)
   CC: T2DM follow up   HPI:  [name] is a 68 y.o. year-old male with PMH listed below who presents to clinic for T2DM follow up. Please see problem based assessment and plan for further details.   Past Medical History:  Diagnosis Date  . Asthma    per 2003 [facility] pulm records pfts   . Blood dyscrasia    HIV  . Clotting disorder ([place])   . Colon polyps    noted previous colonoscopy [facility]  . DDD (degenerative disc disease)    cervical spine  . Depression   . Diabetes mellitus    dx 2010  . Fever    unknwon origin  . GERD (gastroesophageal reflux disease)   . Head swelling 05/23/2017  . Hep C w/o coma, chronic ([place])   . History of syphilis    noted [facility] records  . HIV infection ([place])    undetectable viral load and CD4 ct 667 as of 11/2011  . Hyperlipidemia   . Hypertension   . MRSA (methicillin resistant Staphylococcus aureus)   . Multiple sclerosis ([place])    in remission as of 11/2011 (diagnosed late 1980s)  . Pain in limb-Right Leg 10/13/2013  . 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 Pneumonia   . PVD (peripheral vascular disease) ([place])    Left Stent 07/02/2008  . Scalp lesion 07/26/2017  . UTI (urinary tract infection)   . Wears dentures    Review of Systems:   Review of Systems  Constitutional: Negative for chills, fever and weight loss.  Gastrointestinal: Negative for abdominal pain, nausea and vomiting.  Genitourinary: Negative for frequency and urgency.    Physical Exam:  Vitals:   11/11/18 1445  BP: (!) 116/54  Pulse: 79  Temp: 97.6 F (36.4 C)  TempSrc: Oral  SpO2: 100%  Weight: 179 lb 4.8 oz (81.3 kg)  Height: 6\' 1"  (1.854 m)    General: well appearing male in no acute distress  Cardiac: regular rate and rhythm, nl S1/S2, no murmurs, rubs or gallops Pulm: CTAB, no wheezes or crackles, no increased work of breathing on room air    Assessment & Plan:   See Encounters Tab for problem based charting.  Patient discussed  with Dr. Daryll Drown

## 2018-11-13 NOTE — Assessment & Plan Note (Signed)
Well controlled on lisinopril 40 mg QD. Will continue.

## 2018-11-15 NOTE — Progress Notes (Signed)
Internal Medicine Clinic Attending  Case discussed with Dr. Santos-Sanchez soon after the resident saw the patient.  We reviewed the resident's history and exam and pertinent patient test results.  I agree with the assessment, diagnosis, and plan of care documented in the resident's note.   

## 2018-11-30 ENCOUNTER — Emergency Department (HOSPITAL_COMMUNITY)
Admission: EM | Admit: 2018-11-30 | Discharge: 2018-11-30 | Disposition: A | Discharge status: Home / Self Care | Payer: Medicare Other | Attending: Emergency Medicine | Admitting: Emergency Medicine

## 2018-11-30 ENCOUNTER — Other Ambulatory Visit: Payer: Self-pay

## 2018-11-30 ENCOUNTER — Encounter (HOSPITAL_COMMUNITY): Payer: Self-pay

## 2018-11-30 DIAGNOSIS — Z87891 Personal history of nicotine dependence: Secondary | ICD-10-CM | POA: Diagnosis not present

## 2018-11-30 DIAGNOSIS — I959 Hypotension, unspecified: Secondary | ICD-10-CM | POA: Diagnosis not present

## 2018-11-30 DIAGNOSIS — Z794 Long term (current) use of insulin: Secondary | ICD-10-CM | POA: Insufficient documentation

## 2018-11-30 DIAGNOSIS — E119 Type 2 diabetes mellitus without complications: Secondary | ICD-10-CM | POA: Insufficient documentation

## 2018-11-30 DIAGNOSIS — T671XXA Heat syncope, initial encounter: Secondary | ICD-10-CM

## 2018-11-30 DIAGNOSIS — Z79899 Other long term (current) drug therapy: Secondary | ICD-10-CM | POA: Insufficient documentation

## 2018-11-30 DIAGNOSIS — J45909 Unspecified asthma, uncomplicated: Secondary | ICD-10-CM | POA: Insufficient documentation

## 2018-11-30 DIAGNOSIS — R1111 Vomiting without nausea: Secondary | ICD-10-CM | POA: Diagnosis not present

## 2018-11-30 DIAGNOSIS — R531 Weakness: Secondary | ICD-10-CM | POA: Diagnosis not present

## 2018-11-30 DIAGNOSIS — R0902 Hypoxemia: Secondary | ICD-10-CM | POA: Diagnosis not present

## 2018-11-30 DIAGNOSIS — I10 Essential (primary) hypertension: Secondary | ICD-10-CM | POA: Diagnosis not present

## 2018-11-30 DIAGNOSIS — R55 Syncope and collapse: Secondary | ICD-10-CM | POA: Diagnosis not present

## 2018-11-30 DIAGNOSIS — R4020 Unspecified coma: Secondary | ICD-10-CM | POA: Diagnosis not present

## 2018-11-30 LAB — URINALYSIS, ROUTINE W REFLEX MICROSCOPIC
Bilirubin Urine: NEGATIVE
Glucose, UA: 150 mg/dL — AB
Hgb urine dipstick: NEGATIVE
Ketones, ur: NEGATIVE mg/dL
Leukocytes,Ua: NEGATIVE
Nitrite: NEGATIVE
Protein, ur: NEGATIVE mg/dL
Specific Gravity, Urine: 1.015 (ref 1.005–1.030)
pH: 5 (ref 5.0–8.0)

## 2018-11-30 LAB — COMPREHENSIVE METABOLIC PANEL
ALT: 56 U/L — ABNORMAL HIGH (ref 0–44)
AST: 50 U/L — ABNORMAL HIGH (ref 15–41)
Albumin: 3.1 g/dL — ABNORMAL LOW (ref 3.5–5.0)
Alkaline Phosphatase: 142 U/L — ABNORMAL HIGH (ref 38–126)
Anion gap: 12 (ref 5–15)
BUN: 11 mg/dL (ref 8–23)
CO2: 20 mmol/L — ABNORMAL LOW (ref 22–32)
Calcium: 8.1 mg/dL — ABNORMAL LOW (ref 8.9–10.3)
Chloride: 105 mmol/L (ref 98–111)
Creatinine, Ser: 1.04 mg/dL (ref 0.61–1.24)
GFR calc Af Amer: >60 mL/min (ref >60)
GFR calc non Af Amer: >60 mL/min (ref >60)
Glucose, Bld: 199 mg/dL — ABNORMAL HIGH (ref 70–99)
Potassium: 3.8 mmol/L (ref 3.5–5.1)
Sodium: 137 mmol/L (ref 135–145)
Total Bilirubin: 0.8 mg/dL (ref 0.3–1.2)
Total Protein: 6.3 g/dL — ABNORMAL LOW (ref 6.5–8.1)

## 2018-11-30 LAB — CBC WITH DIFFERENTIAL/PLATELET
Abs Immature Granulocytes: 0.03 10*3/uL (ref 0.00–0.07)
Basophils Absolute: 0 10*3/uL (ref 0.0–0.1)
Basophils Relative: 1 %
Eosinophils Absolute: 0.1 10*3/uL (ref 0.0–0.5)
Eosinophils Relative: 2 %
HCT: 33.2 % — ABNORMAL LOW (ref 39.0–52.0)
Hemoglobin: 11.3 g/dL — ABNORMAL LOW (ref 13.0–17.0)
Immature Granulocytes: 1 %
Lymphocytes Relative: 40 %
Lymphs Abs: 1.3 10*3/uL (ref 0.7–4.0)
MCH: 39.5 pg — ABNORMAL HIGH (ref 26.0–34.0)
MCHC: 34 g/dL (ref 30.0–36.0)
MCV: 116.1 fL — ABNORMAL HIGH (ref 80.0–100.0)
Monocytes Absolute: 0.3 10*3/uL (ref 0.1–1.0)
Monocytes Relative: 10 %
Neutro Abs: 1.5 10*3/uL — ABNORMAL LOW (ref 1.7–7.7)
Neutrophils Relative %: 46 %
Platelets: 141 10*3/uL — ABNORMAL LOW (ref 150–400)
RBC: 2.86 MIL/uL — ABNORMAL LOW (ref 4.22–5.81)
RDW: 14.1 % (ref 11.5–15.5)
WBC: 3.2 10*3/uL — ABNORMAL LOW (ref 4.0–10.5)
nRBC: 0 % (ref 0.0–0.2)

## 2018-11-30 MED ORDER — SODIUM CHLORIDE 0.9 % IV BOLUS
1000.0000 mL | Freq: Once | INTRAVENOUS | Status: AC
  Administered 2018-11-30: 1000 mL via INTRAVENOUS

## 2018-11-30 NOTE — ED Triage Notes (Signed)
Pt BIB EMS from pts work (apartment [employer]). Pt had passed out in yard. Pt wearing multiple layers of clothing and stated to EMS he had not been drinking water. Pt awake upon arrival for EMS, pt was pale and clammy for EMS. Hx of diabetes and MS.   18 L AC  800 mL NS by EMS CBG 200

## 2018-11-30 NOTE — Discharge Instructions (Addendum)
Return here as needed.  Increase your fluid intake.  Follow-up with your doctor. °

## 2018-11-30 NOTE — ED Notes (Signed)
Pt able to ambulate around room, denies feeling dizziness. Pt states he feels much better. PO fluids and sandwich given upon request. Dalia Heading, PA aware.

## 2018-11-30 NOTE — ED Notes (Signed)
Pt states his neighbor is taking him home

## 2018-11-30 NOTE — ED Notes (Signed)
An After Visit Summary was printed and given to the patient. Discharge instructions given and no further questions at this time.  

## 2018-11-30 NOTE — ED Provider Notes (Signed)
[facility] DEPT Provider Note   CSN: [id number] Arrival date & time: 11/30/18  1330     History   Chief Complaint No chief complaint on file.   HPI Nathan [name] is a 68 y.o. male.     HPI Patient presents to the emergency department with syncopal episodes that occurred just prior to arrival.  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  The patient had not been drinking water but had coffee and cereal this morning but no other food.  Patient states that nothing seems to make the condition better or worse.  The patient denies chest pain, shortness of breath, headache,blurred vision, neck pain, fever, cough, weakness, numbness, dizziness, anorexia, edema, abdominal pain, nausea, vomiting, diarrhea, rash, back pain, dysuria, hematemesis, bloody stool.  Past Medical History:  Diagnosis Date  . Asthma    per 2003 [facility] pulm records pfts   . Blood dyscrasia    HIV  . Clotting disorder ([place])   . Colon polyps    noted previous colonoscopy [facility]  . DDD (degenerative disc disease)    cervical spine  . Depression   . Diabetes mellitus    dx 2010  . Fever    unknwon origin  . GERD (gastroesophageal reflux disease)   . Head swelling 05/23/2017  . Hep C w/o coma, chronic ([place])   . History of syphilis    noted [facility] records  . HIV infection ([place])    undetectable viral load and CD4 ct 667 as of 11/2011  . Hyperlipidemia   . Hypertension   . MRSA (methicillin resistant Staphylococcus aureus)   . Multiple sclerosis ([place])    in remission as of 11/2011 (diagnosed late 1980s)  . Pain in limb-Right Leg 10/13/2013  . 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[place])    2002  . Pneumonia   . PVD (peripheral vascular disease) ([place])    Left Stent 07/02/2008  . Scalp lesion 07/26/2017  . UTI (urinary tract infection)   . Wears dentures     Patient Active  Problem List   Diagnosis Date Noted  . History of syphilis 10/09/2013  . Atherosclerosis of native artery of extremity with intermittent claudication ([place]) 04/08/2012  . Occlusion and stenosis of carotid artery without mention of cerebral infarction 12/11/2011  . Phantom limb syndrome (right lower limb) 11/23/2011  . Health care maintenance 11/23/2011  . Status post above knee amputation ([place]) 08/02/2009  . DM type 2 with diabetic peripheral neuropathy ([place]) 04/05/2009  . Benign essential HTN 02/13/2008  . Hyperlipidemia associated with type 2 diabetes mellitus ([place]) 07/29/2007  . Human immunodeficiency virus I infection ([place]) 02/12/2006  . COLONIC POLYPS, ADENOMATOUS 02/12/2006  . Multiple sclerosis ([place]) 02/12/2006  . PERIPHERAL VASCULAR DISEASE 02/12/2006  . GERD 02/12/2006  . Hepatitis B core antibody positive 02/12/2006    Past Surgical History:  Procedure Laterality Date  . ABDOMINAL AORTOGRAM W/LOWER EXTREMITY N/A 08/07/2017   Procedure: ABDOMINAL AORTOGRAM W/LOWER EXTREMITY;  Surgeon: Serafina Mitchell, MD;  Location: [facility] CV LAB;  Service: Cardiovascular;  Laterality: N/A;  . ABOVE KNEE LEG AMPUTATION     Right Leg  . COLONOSCOPY W/ BIOPSIES AND POLYPECTOMY    . LOWER EXTREMITY ANGIOGRAM Left 12/26/2011   Procedure: LOWER EXTREMITY ANGIOGRAM;  Surgeon: Serafina Mitchell, MD;  Location: [facility] CATH LAB;  Service: Cardiovascular;  Laterality: Left;  .  LOWER EXTREMITY ANGIOGRAM Left 08/17/2017   Procedure: LOWER EXTREMITY ANGIOGRAM LEFT LEG WITH RUNOFF AND Stenting.;  Surgeon: Serafina Mitchell, MD;  Location: [facility];  Service: Vascular;  Laterality: Left;  Marland Kitchen MULTIPLE TOOTH EXTRACTIONS    . OTHER SURGICAL HISTORY     right lower ext AKA with prothesis   . OTHER SURGICAL HISTORY     left left with stents (Dr. Seward Speck)  . OTHER SURGICAL HISTORY     2003 colonoscopy 5 mm polyp transverse colon; (2)36m  polyps in rectum-hyperplastic  . PERIPHERAL VASCULAR INTERVENTION Left  08/17/2017   Procedure: POPLITEAL STENT;  Surgeon: BSerafina Mitchell MD;  [facility];  Service: Vascular;  Laterality: Left;        Home Medications    Prior to Admission medications   Medication Sig Start Date End Date Taking? Authorizing Provider  atorvastatin (LIPITOR) 40 MG tablet TAKE 1 TABLET(40 MG) BY MOUTH DAILY 11/13/18   SWelford Roche MD  bictegravir-emtricitabine-tenofovir AF (BIKTARVY) 50-200-25 MG TABS tablet Take 1 tablet by mouth daily. 08/12/18   VTruman Hayward MD  Blood Glucose Monitoring Suppl (BAYER CONTOUR MONITOR) W/DEVICE KIT Use to check blood sugar as instructed up to 4 times a day 07/11/11   BBartholomew Crews MD  clopidogrel (PLAVIX) 75 MG tablet TAKE 1 TABLET BY MOUTH DAILY 02/12/18   SWelford Roche MD  glucose blood (CONTOUR NEXT TEST) test strip 1 each by Other route 4 (four) times daily. Use 4 times daily to check blood sugar. diag code E11.42. 09/23/18   SWelford Roche MD  HUMALOG KWIKPEN 100 UNIT/ML KwikPen  02/20/18   [provider]  ibuprofen (ADVIL,MOTRIN) 200 MG tablet Take 600 mg by mouth 3 (three) times daily as needed for headache or moderate pain.     [provider]  Insulin Glargine, 2 Unit Dial, (TOUJEO MAX SOLOSTAR) 300 UNIT/ML SOPN Inject 70 Units into the skin at bedtime. 11/11/18   Santos-Sanchez, IMerlene Morse MD  insulin lispro (HUMALOG KWIKPEN) 100 UNIT/ML KiwkPen Inject 0.22 mLs (22 Units total) into the skin 3 (three) times daily with meals. 12/07/17   Santos-Sanchez, IMerlene Morse MD  Insulin Pen Needle (BD PEN NEEDLE NANO U/F) 32G X 4 MM MISC USE AS DIRECTED FOUR TIMES DAILY. Dx code  E11.42 05/10/17   WJule Ser DO  Lancet Devices (BAYER MICROLET 2 LANCING [facility] MISC Use to check blood sugar up to 3 ties a day 04/08/15   WJule Ser DO  liraglutide (VICTOZA) 18 MG/3ML SOPN ADMINISTER 1.8 MG UNDER THE SKIN EVERY MORNING 10/21/18   SWelford Roche MD  lisinopril (PRINIVIL,ZESTRIL) 40 MG  tablet TAKE 1 TABLET BY MOUTH DAILY 08/13/18   GAnnia Belt MD  omeprazole (PRILOSEC) 40 MG capsule TAKE ONE CAPSULE BY MOUTH EVERY DAY 08/05/18   VAxel Filler MD  pioglitazone (ACTOS) 30 MG tablet Take 1 tablet (30 mg total) by mouth daily. 10/08/18   SWelford Roche MD  zidovudine (RETROVIR) 300 MG tablet TAKE 1 TABLET(300 MG) BY MOUTH TWICE DAILY 08/12/18   VTommy Medal CLavell Islam MD    Family History Family History  Problem Relation Age of Onset  . Diabetes Mother   . Coronary artery disease Mother   . Cancer Brother        colon caner stage 4 as of 11/2011 (unknown age of onset)  . Coronary artery disease Father   . Prostate cancer Brother   . Colon cancer Brother   . Rectal cancer Neg Hx  Social History Social History   Tobacco Use  . Smoking status: Former Smoker    Quit date: 05/09/2007    Years since quitting: 11.5  . Smokeless tobacco: Former Systems developer    Types: Chew  Substance Use Topics  . Alcohol use: No    Alcohol/week: 0.0 standard drinks  . Drug use: No     Allergies   Sulfonamide derivatives   Review of Systems Review of Systems  All other systems negative except as documented in the HPI. All pertinent positives and negatives as reviewed in the HPI. Physical Exam Updated Vital Signs BP 126/67   Pulse 93   Resp 18   SpO2 96%   Physical Exam Vitals signs and nursing note reviewed.  Constitutional:      General: He is not in acute distress.    Appearance: He is well-developed.  HENT:     Head: Normocephalic and atraumatic.  Eyes:     Pupils: Pupils are equal, round, and reactive to light.  Neck:     Musculoskeletal: Normal range of motion and neck supple.  Cardiovascular:     Rate and Rhythm: Normal rate and regular rhythm.     Heart sounds: Normal heart sounds. No murmur. No friction rub. No gallop.   Pulmonary:     Effort: Pulmonary effort is normal. No respiratory distress.     Breath sounds: Normal breath sounds. No  wheezing.  Abdominal:     General: Bowel sounds are normal. There is no distension.     Palpations: Abdomen is soft.     Tenderness: There is no abdominal tenderness.  Skin:    General: Skin is warm and dry.     Capillary Refill: Capillary refill takes less than 2 seconds.     Findings: No erythema or rash.  Neurological:     Mental Status: He is alert and oriented to person, place, and time.     Motor: No abnormal muscle tone.     Coordination: Coordination normal.  Psychiatric:        Behavior: Behavior normal.      ED Treatments / Results  Labs (all labs ordered are listed, but only abnormal results are displayed) Labs Reviewed  CBC WITH DIFFERENTIAL/PLATELET - Abnormal; Notable for the following components:      Result Value   WBC 3.2 (*)    RBC 2.86 (*)    Hemoglobin 11.3 (*)    HCT 33.2 (*)    MCV 116.1 (*)    MCH 39.5 (*)    Platelets 141 (*)    Neutro Abs 1.5 (*)    All other components within normal limits  COMPREHENSIVE METABOLIC PANEL - Abnormal; Notable for the following components:   CO2 20 (*)    Glucose, Bld 199 (*)    Calcium 8.1 (*)    Total Protein 6.3 (*)    Albumin 3.1 (*)    AST 50 (*)    ALT 56 (*)    Alkaline Phosphatase 142 (*)    All other components within normal limits  URINALYSIS, ROUTINE W REFLEX MICROSCOPIC - Abnormal; Notable for the following components:   APPearance HAZY (*)    Glucose, UA 150 (*)    All other components within normal limits    EKG None  Radiology No results found.  Procedures Procedures (including critical care time)  Medications Ordered in ED Medications  sodium chloride 0.9 % bolus 1,000 mL (0 mLs Intravenous Stopped 11/30/18 1547)     Initial  Impression / Assessment and Plan / ED Course  I have reviewed the triage vital signs and the nursing notes.  Pertinent labs & imaging results that were available during my care of the patient were reviewed by me and considered in my medical decision making  (see chart for details).       Patient has syncopal episode most likely based on what occurred due to heat exposure and working outside with very little fluid intake or food.  Patient has been hydrated here in the emergency department his vital signs remained normal.  Final Clinical Impressions(s) / ED Diagnoses   Final diagnoses:  None    ED Discharge Orders    None       Rebeca Allegra 11/30/18 1704    Julianne Rice, MD 11/30/18 2130

## 2018-11-30 NOTE — ED Notes (Signed)
ED Provider at bedside. 

## 2018-12-03 ENCOUNTER — Telehealth: Payer: Self-pay

## 2018-12-03 NOTE — Telephone Encounter (Signed)
Pt states the right side of his chest is swollen and something bite him. He is not feeling well and running low grade fever. Please call pt back.

## 2018-12-03 NOTE — Telephone Encounter (Signed)
Agree with [facility] appt. Thanks!

## 2018-12-03 NOTE — Telephone Encounter (Signed)
rtc to pt he states sat he was mowing and cleaning yard and "I fell out, I laid there for appr 1/12to 2 hrs, I was found and ems brought me to [facility] ED. I was treated for some heat thing but then that night when I got home my chest area began to sting itch and burn, I scratched it a lot, its still painful and I have fevers to 101 oral but maybe my thermometer doesn't work. appt 7/29 at [facility]

## 2018-12-04 ENCOUNTER — Ambulatory Visit (INDEPENDENT_AMBULATORY_CARE_PROVIDER_SITE_OTHER): Payer: Medicare Other | Admitting: Internal Medicine

## 2018-12-04 ENCOUNTER — Encounter: Payer: Self-pay | Admitting: Internal Medicine

## 2018-12-04 ENCOUNTER — Other Ambulatory Visit: Payer: Self-pay

## 2018-12-04 VITALS — BP 138/59 | HR 74 | Temp 98.2°F | Ht 73.0 in | Wt 183.0 lb

## 2018-12-04 DIAGNOSIS — L0390 Cellulitis, unspecified: Secondary | ICD-10-CM | POA: Insufficient documentation

## 2018-12-04 DIAGNOSIS — B20 Human immunodeficiency virus [HIV] disease: Secondary | ICD-10-CM

## 2018-12-04 DIAGNOSIS — Z87891 Personal history of nicotine dependence: Secondary | ICD-10-CM | POA: Diagnosis not present

## 2018-12-04 DIAGNOSIS — L03818 Cellulitis of other sites: Secondary | ICD-10-CM

## 2018-12-04 DIAGNOSIS — E119 Type 2 diabetes mellitus without complications: Secondary | ICD-10-CM

## 2018-12-04 DIAGNOSIS — E785 Hyperlipidemia, unspecified: Secondary | ICD-10-CM

## 2018-12-04 DIAGNOSIS — I10 Essential (primary) hypertension: Secondary | ICD-10-CM | POA: Diagnosis not present

## 2018-12-04 DIAGNOSIS — G35 Multiple sclerosis: Secondary | ICD-10-CM

## 2018-12-04 DIAGNOSIS — N61 Mastitis without abscess: Secondary | ICD-10-CM | POA: Diagnosis not present

## 2018-12-04 MED ORDER — CEPHALEXIN 500 MG PO CAPS: 500.0000 mg | ORAL_CAPSULE | Freq: Four times a day (QID) | ORAL | 0 refills | Status: AC

## 2018-12-04 NOTE — Assessment & Plan Note (Addendum)
Patient presents the clinic today for evaluation of a tender right breast.  Patient notes symptoms began on Saturday and progressively worsened.  Associated with fever--T-max 102.  Patient notes that he had a syncopal event on Saturday resulting in him lying on the ground for around 2 hours.  Patient was seen at [facility] long ED for this.  He does not note any bug bites but symptoms did began about 8 to 12 hours after.  Physical exam and ultrasound consistent with cellulitis.  Plan: Keflex 500 mg 4 times daily x7 days.  Patient will return to our clinic or the ED if symptoms do not improve or worsen.  Encouraged him to use Tylenol and ibuprofen in the meantime for his fever.

## 2018-12-04 NOTE — Addendum Note (Signed)
Addended by: Aldine Contes on: 12/04/2018 03:18 PM   Modules accepted: Level of Service

## 2018-12-04 NOTE — Progress Notes (Signed)
Internal Medicine Clinic Attending  I saw and evaluated the patient.  I personally confirmed the key portions of the history and exam documented by Dr. Christian   and I reviewed pertinent patient test results.  The assessment, diagnosis, and plan were formulated together and I agree with the documentation in the resident's note.  

## 2018-12-04 NOTE — Progress Notes (Signed)
   CC: insect bite  HPI:  Nathan [name] is a 68 y.o. male with past medical history significant for essential hypertension, hyperlipidemia, type 2 diabetes, MS, HIV.   Patient presents to the clinic today with a 4-day history of increasing right breast pain.  This been accompanied by fever--T-max 102.  Patient is not aware of any inciting event.  Denies any drainage from the breast.  Patient did note that he had a syncopal event on Saturday resulting in him lying on the ground for a couple of hours.  He did report to [facility] long ED for this.  Past Medical History:  Diagnosis Date  . Asthma    per 2003 [facility] pulm records pfts   . Blood dyscrasia    HIV  . Clotting disorder ([place])   . Colon polyps    noted previous colonoscopy [facility]  . DDD (degenerative disc disease)    cervical spine  . Depression   . Diabetes mellitus    dx 2010  . Fever    unknwon origin  . GERD (gastroesophageal reflux disease)   . Head swelling 05/23/2017  . Hep C w/o coma, chronic ([place])   . History of syphilis    noted [facility] records  . HIV infection ([place])    undetectable viral load and CD4 ct 667 as of 11/2011  . Hyperlipidemia   . Hypertension   . MRSA (methicillin resistant Staphylococcus aureus)   . Multiple sclerosis ([place])    in remission as of 11/2011 (diagnosed late 1980s)  . Pain in limb-Right Leg 10/13/2013  . 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 Pneumonia   . PVD (peripheral vascular disease) ([place])    Left Stent 07/02/2008  . Scalp lesion 07/26/2017  . UTI (urinary tract infection)   . Wears dentures    Review of Systems: Patient denies any recent nausea, vomiting, diarrhea, constipation, chest pain, shortness of breath, cough. Physical Exam:  Vitals:   12/04/18 0844  BP: (!) 138/59  Pulse: 74  Temp: 98.2 F (36.8 C)  TempSrc: Oral  SpO2: 100%  Weight: 183 lb (83 kg)  Height: 6\' 1"  (1.854 m)    GENERAL: well appearing, in no apparent distress CARDIAC: heart  regular rate and rhythm, no peripheral edema appreciated PULMONARY: lung sounds clear to auscultation SKIN: Erythematous, indurated, tender right breast.  Primarily located in the upper outer region.  Warm to touch.   NEURO: CN II-XII grossly intact Lymph: No axillary lymphadenopathy appreciated.  Ultrasound: Right breast ultrasound performed to rule out abscess formation.  Edema appreciated but no collection of fluid consistent with an abscess seen on ultrasound.  Assessment & Plan:   See Encounters Tab for problem based charting.  Pertinent labs & imaging results that were available during my care of the patient were reviewed by me and considered in my medical decision making  Patient is in agreement with the plan and endorses no further questions at this time.  Patient seen with Dr. Adolm Joseph, MD Internal Medicine Resident-PGY1 12/04/18

## 2018-12-04 NOTE — Patient Instructions (Signed)
It was a pleasure meeting you today!  I am sending an antibiotic into your pharmacy for the skin infection.  You will need to take this 4 times a day for 7 days.  If your symptoms do not improve or get worse, please come back to the clinic to get reevaluated.  In the meantime, feel free to use ibuprofen and Tylenol as needed for your fever.

## 2018-12-09 ENCOUNTER — Ambulatory Visit (INDEPENDENT_AMBULATORY_CARE_PROVIDER_SITE_OTHER): Payer: Medicare Other | Admitting: Internal Medicine

## 2018-12-09 ENCOUNTER — Other Ambulatory Visit: Payer: Self-pay

## 2018-12-09 ENCOUNTER — Encounter: Payer: Self-pay | Admitting: Internal Medicine

## 2018-12-09 VITALS — BP 162/77 | HR 71 | Temp 98.5°F

## 2018-12-09 DIAGNOSIS — L03818 Cellulitis of other sites: Secondary | ICD-10-CM

## 2018-12-09 DIAGNOSIS — Z87891 Personal history of nicotine dependence: Secondary | ICD-10-CM | POA: Diagnosis not present

## 2018-12-09 DIAGNOSIS — Z7902 Long term (current) use of antithrombotics/antiplatelets: Secondary | ICD-10-CM

## 2018-12-09 MED ORDER — ACETAMINOPHEN 500 MG PO TABS
500.0000 mg | ORAL_TABLET | Freq: Once | ORAL | Status: AC
  Administered 2018-12-09: 12:00:00 500 mg via ORAL

## 2018-12-09 MED ORDER — DOXYCYCLINE HYCLATE 100 MG PO TABS: 100.0000 mg | ORAL_TABLET | Freq: Two times a day (BID) | ORAL | 0 refills | Status: AC

## 2018-12-09 NOTE — Patient Instructions (Addendum)
[name],  It was a pleasure taking care of you here at the clinic today.  On my assessment, we found out there was a pocket of abscess at your left breast area and we drained it here.  Hopefully this will help you and also relieve your pain  I am starting you on a new antibiotic called doxycycline.  You take 1 pill twice a day for 7 days  If your symptoms worsens I would like for you to return to clinic for reassessment.  Take Care! Dr. Eileen Stanford  Please call the internal medicine center clinic if you have any questions or concerns, we may be able to help and keep you from a long and expensive emergency room wait. Our clinic and after hours phone number is [phone], the best time to call is Monday through Friday 9 am to 4 pm but there is always someone available 24/7 if you have an emergency. If you need medication refills please notify your pharmacy one week in advance and they will send [place] a request.

## 2018-12-09 NOTE — Assessment & Plan Note (Addendum)
Purulent cellulitis of right anterior chest: [name] was evaluated by Dr. Lucinda Dell on December 04, 2018 of right anterior chest cellulitis.  On presentation, his skin was erythematous up to the right axilla however there were no palpated fluid pockets or feeling of induration.  He was also examined with point-of-care ultrasound and there were no visible fluid pockets.  At that visit, he was started on Keflex and reports some improvement to erythema and he has not had any subsequent fevers however he does report today that he started that his erythema was now more localized.  On presentation today, his erythema has improved however there is significant amount of loculation at the right anterior chest just above the areola.  Point-of-care ultrasound did not show evidence of fluid pocket.   INCISION AND DRAINAGE Performed by: Jean Rosenthal Consent: Verbal consent obtained. Risks and benefits: risks, benefits and alternatives were discussed Type: abscess  Body area: Right anterior chest Anesthesia: local infiltration Incision was made with a scalpel. Local anesthetic: lidocaine 1%  Anesthetic total: 2 ml Drainage: purulent Drainage amount: 5-10 cc Patient tolerance: Patient tolerated the procedure well with no immediate complications.   Plan: - Incision and drainage of abscess -Doxycycline 100 mg BID x7 days -Given return precautions

## 2018-12-09 NOTE — Progress Notes (Signed)
   CC: Cellulitis of right anterior chest  HPI:  Nathan [name] is a 68 y.o. very pleasant gentleman with medical history listed below presenting for evaluation of right anterior chest cellulitis.  Please see problem based charting for further details  Past Medical History:  Diagnosis Date  . Asthma    per 2003 [facility] pulm records pfts   . Blood dyscrasia    HIV  . Clotting disorder ([place])   . Colon polyps    noted previous colonoscopy [facility]  . DDD (degenerative disc disease)    cervical spine  . Depression   . Diabetes mellitus    dx 2010  . Fever    unknwon origin  . GERD (gastroesophageal reflux disease)   . Head swelling 05/23/2017  . Hep C w/o coma, chronic ([place])   . History of syphilis    noted [facility] records  . HIV infection ([place])    undetectable viral load and CD4 ct 667 as of 11/2011  . Hyperlipidemia   . Hypertension   . MRSA (methicillin resistant Staphylococcus aureus)   . Multiple sclerosis ([place])    in remission as of 11/2011 (diagnosed late 1980s)  . Pain in limb-Right Leg 10/13/2013  . 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 Pneumonia   . PVD (peripheral vascular disease) ([place])    Left Stent 07/02/2008  . Scalp lesion 07/26/2017  . UTI (urinary tract infection)   . Wears dentures    Review of Systems:  As per HPI  Physical Exam:  Vitals:   12/09/18 1043  BP: (!) 162/77  Pulse: 71  Temp: 98.5 F (36.9 C)  TempSrc: Oral  SpO2: 97%   Physical Exam Vitals signs and nursing note reviewed.  Constitutional:      General: Distressed: Mild distress.  Skin:    Findings: Erythema present.     Comments: 5 cm x 5 cm right anterior chest lesion, erythematous, not warm to touch.  On palpation, there is loculation.       Assessment & Plan:   See Encounters Tab for problem based charting.  Patient discussed with Dr. Dareen Piano

## 2018-12-10 NOTE — Progress Notes (Signed)
Internal Medicine Clinic Attending  I saw and evaluated the patient.  I personally confirmed the key portions of the history and exam documented by Dr. Eileen Stanford and I reviewed pertinent patient test results.  The assessment, diagnosis, and plan were formulated together and I agree with the documentation in the resident's note.      I was present throughout the I and D and supervised and assisted Dr. Eileen Stanford for the procedure

## 2018-12-18 ENCOUNTER — Other Ambulatory Visit: Payer: Self-pay | Admitting: *Deleted

## 2018-12-18 DIAGNOSIS — E1142 Type 2 diabetes mellitus with diabetic polyneuropathy: Secondary | ICD-10-CM

## 2018-12-18 DIAGNOSIS — IMO0002 Reserved for concepts with insufficient information to code with codable children: Secondary | ICD-10-CM

## 2018-12-18 MED ORDER — CONTOUR NEXT TEST VI STRP: 1.0000 | ORAL_STRIP | Freq: Four times a day (QID) | 1 refills | Status: DC

## 2018-12-23 ENCOUNTER — Encounter: Payer: Medicare Other | Admitting: Internal Medicine

## 2018-12-25 ENCOUNTER — Telehealth: Payer: Self-pay | Admitting: *Deleted

## 2018-12-25 NOTE — Telephone Encounter (Signed)
Requesting refill on:   Insulin lispro (HUMALOG KWIKPEN) 100 UNIT/ML KiwkPen - inject 22 units into skin 3 times daily with meals.  Next appt scheduled 8/31.

## 2018-12-26 ENCOUNTER — Other Ambulatory Visit: Payer: Self-pay | Admitting: Internal Medicine

## 2018-12-26 MED ORDER — HUMALOG KWIKPEN 100 UNIT/ML ~~LOC~~ SOPN: 22.0000 [IU] | PEN_INJECTOR | Freq: Three times a day (TID) | SUBCUTANEOUS | 12 refills | Status: DC

## 2018-12-26 NOTE — Telephone Encounter (Signed)
Refill sent.

## 2018-12-29 ENCOUNTER — Emergency Department (HOSPITAL_COMMUNITY): Payer: Medicare Other

## 2018-12-29 ENCOUNTER — Inpatient Hospital Stay (HOSPITAL_COMMUNITY)
Admission: EM | Admit: 2018-12-29 | Discharge: 2019-01-15 | DRG: 974 | Disposition: A | Discharge status: Skilled Nursing Facility | Payer: Medicare Other | Attending: Student in an Organized Health Care Education/Training Program | Admitting: Student in an Organized Health Care Education/Training Program

## 2018-12-29 ENCOUNTER — Other Ambulatory Visit: Payer: Self-pay

## 2018-12-29 ENCOUNTER — Encounter (HOSPITAL_COMMUNITY): Payer: Self-pay | Admitting: *Deleted

## 2018-12-29 DIAGNOSIS — E872 Acidosis: Secondary | ICD-10-CM | POA: Diagnosis present

## 2018-12-29 DIAGNOSIS — R64 Cachexia: Secondary | ICD-10-CM | POA: Diagnosis not present

## 2018-12-29 DIAGNOSIS — J9811 Atelectasis: Secondary | ICD-10-CM | POA: Diagnosis not present

## 2018-12-29 DIAGNOSIS — Z89611 Acquired absence of right leg above knee: Secondary | ICD-10-CM

## 2018-12-29 DIAGNOSIS — Z7902 Long term (current) use of antithrombotics/antiplatelets: Secondary | ICD-10-CM

## 2018-12-29 DIAGNOSIS — R52 Pain, unspecified: Secondary | ICD-10-CM | POA: Diagnosis not present

## 2018-12-29 DIAGNOSIS — B20 Human immunodeficiency virus [HIV] disease: Secondary | ICD-10-CM | POA: Diagnosis present

## 2018-12-29 DIAGNOSIS — J181 Lobar pneumonia, unspecified organism: Secondary | ICD-10-CM | POA: Diagnosis not present

## 2018-12-29 DIAGNOSIS — E1142 Type 2 diabetes mellitus with diabetic polyneuropathy: Secondary | ICD-10-CM | POA: Diagnosis present

## 2018-12-29 DIAGNOSIS — Z794 Long term (current) use of insulin: Secondary | ICD-10-CM | POA: Diagnosis not present

## 2018-12-29 DIAGNOSIS — J9691 Respiratory failure, unspecified with hypoxia: Secondary | ICD-10-CM | POA: Diagnosis not present

## 2018-12-29 DIAGNOSIS — J9601 Acute respiratory failure with hypoxia: Secondary | ICD-10-CM | POA: Diagnosis present

## 2018-12-29 DIAGNOSIS — D72819 Decreased white blood cell count, unspecified: Secondary | ICD-10-CM | POA: Diagnosis present

## 2018-12-29 DIAGNOSIS — B182 Chronic viral hepatitis C: Secondary | ICD-10-CM | POA: Diagnosis not present

## 2018-12-29 DIAGNOSIS — D509 Iron deficiency anemia, unspecified: Secondary | ICD-10-CM | POA: Diagnosis present

## 2018-12-29 DIAGNOSIS — G35 Multiple sclerosis: Secondary | ICD-10-CM | POA: Diagnosis present

## 2018-12-29 DIAGNOSIS — M6281 Muscle weakness (generalized): Secondary | ICD-10-CM | POA: Diagnosis not present

## 2018-12-29 DIAGNOSIS — Z8249 Family history of ischemic heart disease and other diseases of the circulatory system: Secondary | ICD-10-CM

## 2018-12-29 DIAGNOSIS — R0902 Hypoxemia: Secondary | ICD-10-CM | POA: Diagnosis not present

## 2018-12-29 DIAGNOSIS — R000 Tachycardia, unspecified: Secondary | ICD-10-CM | POA: Diagnosis present

## 2018-12-29 DIAGNOSIS — M6282 Rhabdomyolysis: Secondary | ICD-10-CM | POA: Diagnosis present

## 2018-12-29 DIAGNOSIS — A419 Sepsis, unspecified organism: Secondary | ICD-10-CM | POA: Diagnosis not present

## 2018-12-29 DIAGNOSIS — D72829 Elevated white blood cell count, unspecified: Secondary | ICD-10-CM | POA: Diagnosis not present

## 2018-12-29 DIAGNOSIS — Y92039 Unspecified place in apartment as the place of occurrence of the external cause: Secondary | ICD-10-CM

## 2018-12-29 DIAGNOSIS — R911 Solitary pulmonary nodule: Secondary | ICD-10-CM | POA: Diagnosis not present

## 2018-12-29 DIAGNOSIS — Z833 Family history of diabetes mellitus: Secondary | ICD-10-CM

## 2018-12-29 DIAGNOSIS — R0602 Shortness of breath: Secondary | ICD-10-CM | POA: Diagnosis not present

## 2018-12-29 DIAGNOSIS — J189 Pneumonia, unspecified organism: Secondary | ICD-10-CM | POA: Diagnosis present

## 2018-12-29 DIAGNOSIS — M545 Low back pain: Secondary | ICD-10-CM | POA: Diagnosis present

## 2018-12-29 DIAGNOSIS — I10 Essential (primary) hypertension: Secondary | ICD-10-CM | POA: Diagnosis present

## 2018-12-29 DIAGNOSIS — I959 Hypotension, unspecified: Secondary | ICD-10-CM | POA: Diagnosis not present

## 2018-12-29 DIAGNOSIS — S0990XA Unspecified injury of head, initial encounter: Secondary | ICD-10-CM | POA: Diagnosis not present

## 2018-12-29 DIAGNOSIS — R0600 Dyspnea, unspecified: Secondary | ICD-10-CM

## 2018-12-29 DIAGNOSIS — B1920 Unspecified viral hepatitis C without hepatic coma: Secondary | ICD-10-CM | POA: Diagnosis not present

## 2018-12-29 DIAGNOSIS — Z79899 Other long term (current) drug therapy: Secondary | ICD-10-CM

## 2018-12-29 DIAGNOSIS — R6520 Severe sepsis without septic shock: Secondary | ICD-10-CM | POA: Diagnosis not present

## 2018-12-29 DIAGNOSIS — Z21 Asymptomatic human immunodeficiency virus [HIV] infection status: Secondary | ICD-10-CM | POA: Diagnosis not present

## 2018-12-29 DIAGNOSIS — D7589 Other specified diseases of blood and blood-forming organs: Secondary | ICD-10-CM | POA: Diagnosis not present

## 2018-12-29 DIAGNOSIS — J90 Pleural effusion, not elsewhere classified: Secondary | ICD-10-CM | POA: Diagnosis present

## 2018-12-29 DIAGNOSIS — Z20828 Contact with and (suspected) exposure to other viral communicable diseases: Secondary | ICD-10-CM | POA: Diagnosis present

## 2018-12-29 DIAGNOSIS — M5136 Other intervertebral disc degeneration, lumbar region: Secondary | ICD-10-CM | POA: Diagnosis not present

## 2018-12-29 DIAGNOSIS — J918 Pleural effusion in other conditions classified elsewhere: Secondary | ICD-10-CM | POA: Diagnosis not present

## 2018-12-29 DIAGNOSIS — J45909 Unspecified asthma, uncomplicated: Secondary | ICD-10-CM | POA: Diagnosis present

## 2018-12-29 DIAGNOSIS — F329 Major depressive disorder, single episode, unspecified: Secondary | ICD-10-CM | POA: Diagnosis present

## 2018-12-29 DIAGNOSIS — Y92009 Unspecified place in unspecified non-institutional (private) residence as the place of occurrence of the external cause: Secondary | ICD-10-CM | POA: Insufficient documentation

## 2018-12-29 DIAGNOSIS — Z66 Do not resuscitate: Secondary | ICD-10-CM | POA: Diagnosis present

## 2018-12-29 DIAGNOSIS — D531 Other megaloblastic anemias, not elsewhere classified: Secondary | ICD-10-CM | POA: Diagnosis not present

## 2018-12-29 DIAGNOSIS — E119 Type 2 diabetes mellitus without complications: Secondary | ICD-10-CM | POA: Diagnosis not present

## 2018-12-29 DIAGNOSIS — I739 Peripheral vascular disease, unspecified: Secondary | ICD-10-CM | POA: Diagnosis not present

## 2018-12-29 DIAGNOSIS — Z882 Allergy status to sulfonamides status: Secondary | ICD-10-CM | POA: Diagnosis not present

## 2018-12-29 DIAGNOSIS — D539 Nutritional anemia, unspecified: Secondary | ICD-10-CM | POA: Diagnosis not present

## 2018-12-29 DIAGNOSIS — Z8042 Family history of malignant neoplasm of prostate: Secondary | ICD-10-CM

## 2018-12-29 DIAGNOSIS — E785 Hyperlipidemia, unspecified: Secondary | ICD-10-CM | POA: Diagnosis present

## 2018-12-29 DIAGNOSIS — Z9981 Dependence on supplemental oxygen: Secondary | ICD-10-CM

## 2018-12-29 DIAGNOSIS — K219 Gastro-esophageal reflux disease without esophagitis: Secondary | ICD-10-CM | POA: Diagnosis present

## 2018-12-29 DIAGNOSIS — E1151 Type 2 diabetes mellitus with diabetic peripheral angiopathy without gangrene: Secondary | ICD-10-CM | POA: Diagnosis present

## 2018-12-29 DIAGNOSIS — W19XXXA Unspecified fall, initial encounter: Secondary | ICD-10-CM | POA: Diagnosis present

## 2018-12-29 DIAGNOSIS — B370 Candidal stomatitis: Secondary | ICD-10-CM | POA: Diagnosis not present

## 2018-12-29 DIAGNOSIS — K7200 Acute and subacute hepatic failure without coma: Secondary | ICD-10-CM | POA: Diagnosis not present

## 2018-12-29 DIAGNOSIS — R1312 Dysphagia, oropharyngeal phase: Secondary | ICD-10-CM | POA: Diagnosis not present

## 2018-12-29 DIAGNOSIS — R0603 Acute respiratory distress: Secondary | ICD-10-CM

## 2018-12-29 DIAGNOSIS — R8569 Abnormal cytological findings in specimens from other digestive organs and abdominal cavity: Secondary | ICD-10-CM | POA: Diagnosis not present

## 2018-12-29 DIAGNOSIS — K635 Polyp of colon: Secondary | ICD-10-CM | POA: Diagnosis not present

## 2018-12-29 DIAGNOSIS — M2550 Pain in unspecified joint: Secondary | ICD-10-CM | POA: Diagnosis not present

## 2018-12-29 DIAGNOSIS — R531 Weakness: Secondary | ICD-10-CM | POA: Diagnosis not present

## 2018-12-29 DIAGNOSIS — Z7401 Bed confinement status: Secondary | ICD-10-CM | POA: Diagnosis not present

## 2018-12-29 DIAGNOSIS — J9690 Respiratory failure, unspecified, unspecified whether with hypoxia or hypercapnia: Secondary | ICD-10-CM | POA: Diagnosis not present

## 2018-12-29 DIAGNOSIS — D689 Coagulation defect, unspecified: Secondary | ICD-10-CM | POA: Diagnosis not present

## 2018-12-29 DIAGNOSIS — D696 Thrombocytopenia, unspecified: Secondary | ICD-10-CM | POA: Diagnosis not present

## 2018-12-29 DIAGNOSIS — Z800 Family history of malignant neoplasm of digestive organs: Secondary | ICD-10-CM

## 2018-12-29 DIAGNOSIS — E1165 Type 2 diabetes mellitus with hyperglycemia: Secondary | ICD-10-CM | POA: Diagnosis not present

## 2018-12-29 LAB — COMPREHENSIVE METABOLIC PANEL
ALT: 72 U/L — ABNORMAL HIGH (ref 0–44)
AST: 229 U/L — ABNORMAL HIGH (ref 15–41)
Albumin: 3.1 g/dL — ABNORMAL LOW (ref 3.5–5.0)
Alkaline Phosphatase: 55 U/L (ref 38–126)
Anion gap: 16 — ABNORMAL HIGH (ref 5–15)
BUN: 32 mg/dL — ABNORMAL HIGH (ref 8–23)
CO2: 22 mmol/L (ref 22–32)
Calcium: 9.1 mg/dL (ref 8.9–10.3)
Chloride: 96 mmol/L — ABNORMAL LOW (ref 98–111)
Creatinine, Ser: 1.11 mg/dL (ref 0.61–1.24)
GFR calc Af Amer: >60 mL/min (ref >60)
GFR calc non Af Amer: >60 mL/min (ref >60)
Glucose, Bld: 294 mg/dL — ABNORMAL HIGH (ref 70–99)
Potassium: 4.1 mmol/L (ref 3.5–5.1)
Sodium: 134 mmol/L — ABNORMAL LOW (ref 135–145)
Total Bilirubin: 2.1 mg/dL — ABNORMAL HIGH (ref 0.3–1.2)
Total Protein: 7.7 g/dL (ref 6.5–8.1)

## 2018-12-29 LAB — URINALYSIS, ROUTINE W REFLEX MICROSCOPIC
Bacteria, UA: NONE SEEN
Bilirubin Urine: NEGATIVE
Glucose, UA: >=500 mg/dL — AB
Ketones, ur: 5 mg/dL — AB
Leukocytes,Ua: NEGATIVE
Nitrite: NEGATIVE
Protein, ur: 100 mg/dL — AB
Specific Gravity, Urine: 1.024 (ref 1.005–1.030)
pH: 6 (ref 5.0–8.0)

## 2018-12-29 LAB — CBC WITH DIFFERENTIAL/PLATELET
Abs Immature Granulocytes: 0.11 10*3/uL — ABNORMAL HIGH (ref 0.00–0.07)
Basophils Absolute: 0.1 10*3/uL (ref 0.0–0.1)
Basophils Relative: 1 %
Eosinophils Absolute: 0 10*3/uL (ref 0.0–0.5)
Eosinophils Relative: 0 %
HCT: 37.6 % — ABNORMAL LOW (ref 39.0–52.0)
Hemoglobin: 12.8 g/dL — ABNORMAL LOW (ref 13.0–17.0)
Immature Granulocytes: 1 %
Lymphocytes Relative: 14 %
Lymphs Abs: 1.3 10*3/uL (ref 0.7–4.0)
MCH: 40 pg — ABNORMAL HIGH (ref 26.0–34.0)
MCHC: 34 g/dL (ref 30.0–36.0)
MCV: 117.5 fL — ABNORMAL HIGH (ref 80.0–100.0)
Monocytes Absolute: 0.4 10*3/uL (ref 0.1–1.0)
Monocytes Relative: 5 %
Neutro Abs: 7.6 10*3/uL (ref 1.7–7.7)
Neutrophils Relative %: 79 %
Platelets: 165 10*3/uL (ref 150–400)
RBC: 3.2 MIL/uL — ABNORMAL LOW (ref 4.22–5.81)
RDW: 14.6 % (ref 11.5–15.5)
WBC: 9.5 10*3/uL (ref 4.0–10.5)
nRBC: 0 % (ref 0.0–0.2)

## 2018-12-29 LAB — POCT I-STAT EG7
Acid-Base Excess: 1 mmol/L (ref 0.0–2.0)
Bicarbonate: 26 mmol/L (ref 20.0–28.0)
Calcium, Ion: 1.13 mmol/L — ABNORMAL LOW (ref 1.15–1.40)
HCT: 41 % (ref 39.0–52.0)
Hemoglobin: 13.9 g/dL (ref 13.0–17.0)
O2 Saturation: 83 %
Potassium: 4 mmol/L (ref 3.5–5.1)
Sodium: 136 mmol/L (ref 135–145)
TCO2: 27 mmol/L (ref 22–32)
pCO2, Ven: 41.9 mmHg — ABNORMAL LOW (ref 44.0–60.0)
pH, Ven: 7.402 (ref 7.250–7.430)
pO2, Ven: 48 mmHg — ABNORMAL HIGH (ref 32.0–45.0)

## 2018-12-29 LAB — APTT: aPTT: 33 seconds (ref 24–36)

## 2018-12-29 LAB — GLUCOSE, CAPILLARY
Glucose-Capillary: 233 mg/dL — ABNORMAL HIGH (ref 70–99)
Glucose-Capillary: 203 mg/dL — ABNORMAL HIGH (ref 70–99)

## 2018-12-29 LAB — PROCALCITONIN: Procalcitonin: 4.09 ng/mL

## 2018-12-29 LAB — STREP PNEUMONIAE URINARY ANTIGEN: Strep Pneumo Urinary Antigen: NEGATIVE

## 2018-12-29 LAB — HEMOGLOBIN A1C
Hgb A1c MFr Bld: 6.8 % — ABNORMAL HIGH (ref 4.8–5.6)
Mean Plasma Glucose: 148.46 mg/dL

## 2018-12-29 LAB — LACTIC ACID, PLASMA
Lactic Acid, Venous: 3.6 mmol/L (ref 0.5–1.9)
Lactic Acid, Venous: 1.9 mmol/L (ref 0.5–1.9)

## 2018-12-29 LAB — SARS CORONAVIRUS 2 BY RT PCR (HOSPITAL ORDER, PERFORMED IN ~~LOC~~ HOSPITAL LAB): SARS Coronavirus 2: NEGATIVE

## 2018-12-29 LAB — MRSA PCR SCREENING: MRSA by PCR: NEGATIVE

## 2018-12-29 LAB — PROTIME-INR
INR: 1.1 (ref 0.8–1.2)
Prothrombin Time: 14.5 seconds (ref 11.4–15.2)

## 2018-12-29 LAB — SARS CORONAVIRUS 2 (TAT 6-24 HRS): SARS Coronavirus 2: NEGATIVE

## 2018-12-29 LAB — CK: Total CK: 5224 U/L — ABNORMAL HIGH (ref 49–397)

## 2018-12-29 MED ORDER — ZIDOVUDINE 100 MG PO CAPS
300.0000 mg | ORAL_CAPSULE | Freq: Two times a day (BID) | ORAL | Status: DC
  Administered 2018-12-29 – 2019-01-15 (×28): 300 mg via ORAL
  Filled 2018-12-29 (×36): qty 3

## 2018-12-29 MED ORDER — SODIUM CHLORIDE 0.9 % IV BOLUS (SEPSIS)
1000.0000 mL | Freq: Once | INTRAVENOUS | Status: AC
  Administered 2018-12-29: 1000 mL via INTRAVENOUS

## 2018-12-29 MED ORDER — CLOPIDOGREL BISULFATE 75 MG PO TABS
75.0000 mg | ORAL_TABLET | Freq: Every day | ORAL | Status: DC
  Administered 2018-12-29 – 2019-01-15 (×15): 75 mg via ORAL
  Filled 2018-12-29 (×15): qty 1

## 2018-12-29 MED ORDER — SODIUM CHLORIDE 0.9 % IV SOLN
2.0000 g | Freq: Once | INTRAVENOUS | Status: AC
  Administered 2018-12-29: 2 g via INTRAVENOUS
  Filled 2018-12-29: qty 2

## 2018-12-29 MED ORDER — LACTATED RINGERS IV BOLUS
1000.0000 mL | Freq: Once | INTRAVENOUS | Status: AC
  Administered 2018-12-29: 1000 mL via INTRAVENOUS

## 2018-12-29 MED ORDER — LACTATED RINGERS IV SOLN
INTRAVENOUS | Status: DC
  Administered 2018-12-29: 20:00:00 via INTRAVENOUS

## 2018-12-29 MED ORDER — ENOXAPARIN SODIUM 40 MG/0.4ML ~~LOC~~ SOLN
40.0000 mg | SUBCUTANEOUS | Status: DC
  Administered 2018-12-29 – 2019-01-14 (×17): 40 mg via SUBCUTANEOUS
  Filled 2018-12-29 (×17): qty 0.4

## 2018-12-29 MED ORDER — INSULIN ASPART 100 UNIT/ML ~~LOC~~ SOLN
0.0000 [IU] | Freq: Every day | SUBCUTANEOUS | Status: DC
  Administered 2018-12-29 – 2019-01-10 (×6): 2 [IU] via SUBCUTANEOUS
  Administered 2019-01-12: 3 [IU] via SUBCUTANEOUS
  Administered 2019-01-13: 4 [IU] via SUBCUTANEOUS
  Administered 2019-01-14: 3 [IU] via SUBCUTANEOUS

## 2018-12-29 MED ORDER — ACETAMINOPHEN 325 MG PO TABS
650.0000 mg | ORAL_TABLET | Freq: Once | ORAL | Status: AC
  Administered 2018-12-29: 650 mg via ORAL
  Filled 2018-12-29: qty 2

## 2018-12-29 MED ORDER — INSULIN ASPART 100 UNIT/ML ~~LOC~~ SOLN
0.0000 [IU] | Freq: Three times a day (TID) | SUBCUTANEOUS | Status: DC
  Administered 2018-12-29 – 2019-01-03 (×2): 7 [IU] via SUBCUTANEOUS
  Administered 2019-01-03 – 2019-01-04 (×3): 4 [IU] via SUBCUTANEOUS
  Administered 2019-01-04 – 2019-01-05 (×2): 7 [IU] via SUBCUTANEOUS
  Administered 2019-01-05 – 2019-01-06 (×3): 4 [IU] via SUBCUTANEOUS
  Administered 2019-01-06: 11 [IU] via SUBCUTANEOUS
  Administered 2019-01-06 – 2019-01-07 (×3): 7 [IU] via SUBCUTANEOUS
  Administered 2019-01-07: 4 [IU] via SUBCUTANEOUS
  Administered 2019-01-08: 7 [IU] via SUBCUTANEOUS
  Administered 2019-01-08: 4 [IU] via SUBCUTANEOUS
  Administered 2019-01-08: 7 [IU] via SUBCUTANEOUS
  Administered 2019-01-09: 11 [IU] via SUBCUTANEOUS
  Administered 2019-01-09: 7 [IU] via SUBCUTANEOUS
  Administered 2019-01-09 – 2019-01-10 (×4): 4 [IU] via SUBCUTANEOUS
  Administered 2019-01-11: 7 [IU] via SUBCUTANEOUS
  Administered 2019-01-11: 11 [IU] via SUBCUTANEOUS
  Administered 2019-01-11 – 2019-01-12 (×2): 4 [IU] via SUBCUTANEOUS
  Administered 2019-01-12: 7 [IU] via SUBCUTANEOUS
  Administered 2019-01-12: 11 [IU] via SUBCUTANEOUS
  Administered 2019-01-13: 15 [IU] via SUBCUTANEOUS
  Administered 2019-01-13: 4 [IU] via SUBCUTANEOUS
  Administered 2019-01-13 – 2019-01-15 (×5): 7 [IU] via SUBCUTANEOUS

## 2018-12-29 MED ORDER — SODIUM CHLORIDE 0.9 % IV SOLN
500.0000 mg | INTRAVENOUS | Status: DC
  Administered 2018-12-30 – 2019-01-04 (×6): 500 mg via INTRAVENOUS
  Filled 2018-12-29 (×8): qty 500

## 2018-12-29 MED ORDER — METRONIDAZOLE IN NACL 5-0.79 MG/ML-% IV SOLN
500.0000 mg | Freq: Once | INTRAVENOUS | Status: AC
  Administered 2018-12-29: 500 mg via INTRAVENOUS
  Filled 2018-12-29: qty 100

## 2018-12-29 MED ORDER — ONDANSETRON HCL 4 MG PO TABS: 4.0000 mg | ORAL_TABLET | Freq: Four times a day (QID) | ORAL | Status: DC | PRN

## 2018-12-29 MED ORDER — ONDANSETRON HCL 4 MG/2ML IJ SOLN
4.0000 mg | Freq: Four times a day (QID) | INTRAMUSCULAR | Status: DC | PRN
  Administered 2018-12-30: 4 mg via INTRAVENOUS
  Filled 2018-12-29: qty 2

## 2018-12-29 MED ORDER — SODIUM CHLORIDE 0.9 % IV SOLN: 2.0000 g | Freq: Three times a day (TID) | INTRAVENOUS | Status: DC

## 2018-12-29 MED ORDER — ATORVASTATIN CALCIUM 40 MG PO TABS
40.0000 mg | ORAL_TABLET | Freq: Every day | ORAL | Status: DC
  Administered 2018-12-29 – 2019-01-15 (×15): 40 mg via ORAL
  Filled 2018-12-29 (×15): qty 1

## 2018-12-29 MED ORDER — ACETAMINOPHEN 325 MG PO TABS
650.0000 mg | ORAL_TABLET | Freq: Four times a day (QID) | ORAL | Status: DC | PRN
  Administered 2018-12-29 – 2019-01-04 (×4): 650 mg via ORAL
  Filled 2018-12-29 (×4): qty 2

## 2018-12-29 MED ORDER — VANCOMYCIN HCL IN DEXTROSE 1-5 GM/200ML-% IV SOLN: 1000.0000 mg | Freq: Once | INTRAVENOUS | Status: DC

## 2018-12-29 MED ORDER — IPRATROPIUM-ALBUTEROL 0.5-2.5 (3) MG/3ML IN SOLN
3.0000 mL | Freq: Four times a day (QID) | RESPIRATORY_TRACT | Status: DC | PRN
  Administered 2018-12-30: 3 mL via RESPIRATORY_TRACT
  Filled 2018-12-29: qty 3

## 2018-12-29 MED ORDER — BICTEGRAVIR-EMTRICITAB-TENOFOV 50-200-25 MG PO TABS
1.0000 | ORAL_TABLET | Freq: Every day | ORAL | Status: DC
  Administered 2018-12-30 – 2019-01-15 (×15): 1 via ORAL
  Filled 2018-12-29 (×18): qty 1

## 2018-12-29 MED ORDER — ACETAMINOPHEN 650 MG RE SUPP
650.0000 mg | Freq: Four times a day (QID) | RECTAL | Status: DC | PRN
  Filled 2018-12-29: qty 1

## 2018-12-29 MED ORDER — PANTOPRAZOLE SODIUM 40 MG PO TBEC
40.0000 mg | DELAYED_RELEASE_TABLET | Freq: Every day | ORAL | Status: DC
  Administered 2018-12-29 – 2019-01-15 (×15): 40 mg via ORAL
  Filled 2018-12-29 (×15): qty 1

## 2018-12-29 MED ORDER — SODIUM CHLORIDE 0.9 % IV SOLN
1.0000 g | INTRAVENOUS | Status: DC
  Administered 2018-12-29 – 2019-01-04 (×7): 1 g via INTRAVENOUS
  Filled 2018-12-29 (×2): qty 1
  Filled 2018-12-29: qty 10
  Filled 2018-12-29: qty 1
  Filled 2018-12-29: qty 10
  Filled 2018-12-29: qty 1
  Filled 2018-12-29: qty 10
  Filled 2018-12-29: qty 1

## 2018-12-29 MED ORDER — SODIUM CHLORIDE 0.9 % IV SOLN: 1000.0000 mL | INTRAVENOUS | Status: DC

## 2018-12-29 MED ORDER — INSULIN GLARGINE 100 UNIT/ML ~~LOC~~ SOLN
20.0000 [IU] | Freq: Two times a day (BID) | SUBCUTANEOUS | Status: DC
  Administered 2018-12-29 – 2018-12-30 (×4): 20 [IU] via SUBCUTANEOUS
  Filled 2018-12-29 (×6): qty 0.2

## 2018-12-29 MED ORDER — VANCOMYCIN HCL 10 G IV SOLR
1500.0000 mg | Freq: Once | INTRAVENOUS | Status: AC
  Administered 2018-12-29: 1500 mg via INTRAVENOUS
  Filled 2018-12-29 (×2): qty 1500

## 2018-12-29 MED ORDER — VANCOMYCIN HCL IN DEXTROSE 1-5 GM/200ML-% IV SOLN: 1000.0000 mg | Freq: Two times a day (BID) | INTRAVENOUS | Status: DC

## 2018-12-29 NOTE — ED Notes (Signed)
ED TO INPATIENT HANDOFF REPORT  ED Nurse Name and Phone #: William Hamburger, RN [phone]  S Name/Age/Gender Nathan [name] 68 y.o. male Room/Bed: 022C/022C  Code Status   Code Status: DNR  Home/SNF/Other Home Patient oriented to: situation Is this baseline? No   Triage Complete: Triage complete  Chief Complaint fall/weakness  Triage Note PT foud at home with a  [employer]  Well check. Pt's friend reported Pt was last  Seen 4 days ago. Pt confirmed he fell in his house 4 days ago and has been unable to get up. Pt was found incont of urine in home. Pt reports he has not had drink or food for 4 Days. PT A/O x4  . EMS BP 139/72 , HR 107 , CBG 351   Allergies Allergies  Allergen Reactions  . Sulfonamide Derivatives Hives    Level of Care/Admitting Diagnosis ED Disposition    ED Disposition Condition [facility] [place] [100100]  Level of Care: Progressive [102]  Covid Evaluation: Person Under Investigation (PUI)  Diagnosis: Fall at home [720319]  Admitting Physician: Oda Kilts [id number]  Attending Physician: Oda Kilts [id number]  Estimated length of stay: 3 - 4 days  Certification:: I certify this patient will need inpatient services for at least 2 midnights  PT Class (Do Not Modify): Inpatient [101]  PT Acc Code (Do Not Modify): Private [1]       B Medical/Surgery History Past Medical History:  Diagnosis Date  . Asthma    per 2003 [facility] pulm records pfts   . Blood dyscrasia    HIV  . Clotting disorder ([place])   . Colon polyps    noted previous colonoscopy [facility]  . DDD (degenerative disc disease)    cervical spine  . Depression   . Diabetes mellitus    dx 2010  . Fever    [place] origin  . GERD (gastroesophageal reflux disease)   . Head swelling 05/23/2017  . Hep C w/o coma, chronic ([place])   . History of syphilis    noted [facility] records  . HIV infection ([place])    undetectable viral load and CD4 ct 667 as of  11/2011  . Hyperlipidemia   . Hypertension   . MRSA (methicillin resistant Staphylococcus aureus)   . Multiple sclerosis ([place])    in remission as of 11/2011 (diagnosed late 1980s)  . Pain in limb-Right Leg 10/13/2013  . 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[place])    2002  . Pneumonia   . PVD (peripheral vascular disease) ([place])    Left Stent 07/02/2008  . Scalp lesion 07/26/2017  . UTI (urinary tract infection)   . Wears dentures    Past Surgical History:  Procedure Laterality Date  . ABDOMINAL AORTOGRAM W/LOWER EXTREMITY N/A 08/07/2017   Procedure: ABDOMINAL AORTOGRAM W/LOWER EXTREMITY;  Surgeon: Serafina Mitchell, MD;  Location: [place] [facility];  Service: Cardiovascular;  Laterality: N/A;  . ABOVE KNEE LEG AMPUTATION     Right Leg  . COLONOSCOPY W/ BIOPSIES AND POLYPECTOMY    . LOWER EXTREMITY ANGIOGRAM Left 12/26/2011   Procedure: LOWER EXTREMITY ANGIOGRAM;  Surgeon: Serafina Mitchell, MD;  Location[facility] CATH LAB;  Service: Cardiovascular;  Laterality: Left;  . LOWER EXTREMITY ANGIOGRAM Left 08/17/2017   Procedure: LOWER EXTREMITY ANGIOGRAM LEFT LEG WITH RUNOFF AND Stenting.;  Surgeon: Serafina Mitchell, MD;  Location: [place];  Service: Vascular;  Laterality: Left;  Marland Kitchen MULTIPLE TOOTH EXTRACTIONS    .  OTHER SURGICAL HISTORY     right lower ext AKA with prothesis   . OTHER SURGICAL HISTORY     left left with stents (Dr. Seward Speck)  . OTHER SURGICAL HISTORY     2003 colonoscopy 5 mm polyp transverse colon; (2)68mm  polyps in rectum-hyperplastic  . PERIPHERAL VASCULAR INTERVENTION Left 08/17/2017   Procedure: POPLITEAL STENT;  Surgeon: Serafina Mitchell, MD;  Location: [facility] OR;  Service: Vascular;  Laterality: Left;     A IV Location/Drains/Wounds Patient Lines/Drains/Airways Status   Active Line/Drains/Airways    Name:   Placement date:   Placement time:   Site:   Days:   Peripheral IV 12/29/18 Left Arm   12/29/18    1004    Arm   less than 1   Peripheral IV 12/29/18 Right;Anterior  Forearm   12/29/18    1035    Forearm   less than 1   Peripheral IV 12/29/18 Right;Anterior Forearm   12/29/18    1054    Forearm   less than 1   Incision (Closed) 08/17/17 Groin Left   08/17/17    1505     499          Intake/Output Last 24 hours  Intake/Output Summary (Last 24 hours) at 12/29/2018 1607 Last data filed at 12/29/2018 1343 Gross per 24 hour  Intake 200 ml  Output -  Net 200 ml    Labs/Imaging Results for orders placed or performed during the hospital encounter of 12/29/18 (from the past 48 hour(s))  Lactic acid, plasma     Status: Abnormal   Collection Time: 12/29/18 10:55 AM  Result Value Ref Range   Lactic Acid, Venous 3.6 (HH) 0.5 - 1.9 mmol/L    Comment: CRITICAL RESULT CALLED TO, READ BACK BY AND VERIFIED WITH: A MCKEOWN,RN AT 1132 12/29/2018 BY L BENFIELD Performed at [facility], [place] [address] [place], [place] [address]   Comprehensive metabolic panel     Status: Abnormal   Collection Time: 12/29/18 10:55 AM  Result Value Ref Range   Sodium 134 (L) 135 - 145 mmol/L   Potassium 4.1 3.5 - 5.1 mmol/L    Comment: SLIGHT HEMOLYSIS   Chloride 96 (L) 98 - 111 mmol/L   CO2 22 22 - 32 mmol/L   Glucose, Bld 294 (H) 70 - 99 mg/dL   BUN 32 (H) 8 - 23 mg/dL   Creatinine, Ser 1.11 0.61 - 1.24 mg/dL   Calcium 9.1 8.9 - 10.3 mg/dL   Total Protein 7.7 6.5 - 8.1 g/dL   Albumin 3.1 (L) 3.5 - 5.0 g/dL   AST 229 (H) 15 - 41 U/L   ALT 72 (H) 0 - 44 U/L   Alkaline Phosphatase 55 38 - 126 U/L   Total Bilirubin 2.1 (H) 0.3 - 1.2 mg/dL   GFR calc non Af Amer >60 >60 mL/min   GFR calc Af Amer >60 >60 mL/min   Anion gap 16 (H) 5 - 15    Comment: Performed at [facility], [place] [address], [place], [place] [address]  CBC WITH DIFFERENTIAL     Status: Abnormal   Collection Time: 12/29/18 10:55 AM  Result Value Ref Range   WBC 9.5 4.0 - 10.5 K/uL   RBC 3.20 (L) 4.22 - 5.81 MIL/uL   Hemoglobin 12.8 (L) 13.0 - 17.0 g/dL   HCT 37.6 (L) 39.0 - 52.0 %   MCV  117.5 (H) 80.0 - 100.0 fL   MCH  40.0 (H) 26.0 - 34.0 pg   MCHC 34.0 30.0 - 36.0 g/dL   RDW 14.6 11.5 - 15.5 %   Platelets 165 150 - 400 K/uL   nRBC 0.0 0.0 - 0.2 %   Neutrophils Relative % 79 %   Neutro Abs 7.6 1.7 - 7.7 K/uL   Lymphocytes Relative 14 %   Lymphs Abs 1.3 0.7 - 4.0 K/uL   Monocytes Relative 5 %   Monocytes Absolute 0.4 0.1 - 1.0 K/uL   Eosinophils Relative 0 %   Eosinophils Absolute 0.0 0.0 - 0.5 K/uL   Basophils Relative 1 %   Basophils Absolute 0.1 0.0 - 0.1 K/uL   Immature Granulocytes 1 %   Abs Immature Granulocytes 0.11 (H) 0.00 - 0.07 K/uL    Comment: Performed at [facility] [address] [place], [place] [address]  APTT     Status: None   Collection Time: 12/29/18 10:55 AM  Result Value Ref Range   aPTT 33 24 - 36 seconds    Comment: Performed at [facility] [address], [place], [place] [address]  Protime-INR     Status: None   Collection Time: 12/29/18 10:55 AM  Result Value Ref Range   Prothrombin Time 14.5 11.4 - 15.2 seconds   INR 1.1 0.8 - 1.2    Comment: (NOTE) INR goal varies based on device and disease states. Performed at [facility], [place] [address], [place], [place] [address]   CK     Status: Abnormal   Collection Time: 12/29/18 10:55 AM  Result Value Ref Range   Total CK 5,224 (H) 49 - 397 U/L    Comment: RESULTS CONFIRMED BY MANUAL DILUTION Performed at [facility], [place] [address], [place], [place] [address]   POCT I-Stat EG7     Status: Abnormal   Collection Time: 12/29/18 11:22 AM  Result Value Ref Range   pH, Ven 7.402 7.250 - 7.430   pCO2, Ven 41.9 (L) 44.0 - 60.0 mmHg   pO2, Ven 48.0 (H) 32.0 - 45.0 mmHg   Bicarbonate 26.0 20.0 - 28.0 mmol/L   TCO2 27 22 - 32 mmol/L   O2 Saturation 83.0 %   Acid-Base Excess 1.0 0.0 - 2.0 mmol/L   Sodium 136 135 - 145 mmol/L   Potassium 4.0 3.5 - 5.1 mmol/L   Calcium, Ion 1.13 (L) 1.15 - 1.40 mmol/L   HCT 41.0 39.0 - 52.0 %   Hemoglobin 13.9 13.0 - 17.0  g/dL   Patient temperature HIDE    Sample type VENOUS   Lactic acid, plasma     Status: None   Collection Time: 12/29/18  2:50 PM  Result Value Ref Range   Lactic Acid, Venous 1.9 0.5 - 1.9 mmol/L    Comment: Performed at [facility] [address], [place], [place] [address]   Ct Head Wo Contrast  Result Date: 12/29/2018 CLINICAL DATA:  Fall. Head trauma, minor. GCS greater than or equal to 13. High clinical risk. Patient was found at home with a [employer] well check. Last seen 4 days ago. Patient fell 4 days ago and was unable to get up. EXAM: CT HEAD WITHOUT CONTRAST TECHNIQUE: Contiguous axial images were obtained from the base of the skull through the vertex without intravenous contrast. COMPARISON:  None. FINDINGS: Brain: No evidence of acute infarction, hemorrhage, hydrocephalus, extra-axial collection or mass lesion/mass effect. Vascular: No hyperdense vessel or unexpected calcification. Skull: Normal. Negative  for fracture or focal lesion. Sinuses/Orbits: No acute finding. Other: None. IMPRESSION: Negative exam. Electronically Signed   By: Nolon Nations M.D.   On: 12/29/2018 12:03   Dg Chest Port 1 View  Result Date: 12/29/2018 CLINICAL DATA:  Fall.  Sepsis.  Shortness of breath. EXAM: PORTABLE CHEST 1 VIEW COMPARISON:  01/26/2015 FINDINGS: 1053 hours. Small nodular density noted right lung base superimposed on the posterior right eighth rib. Diffuse airspace disease noted left lung period The cardiopericardial silhouette is within normal limits for size. The visualized bony structures of the thorax are intact. Telemetry leads overlie the chest. IMPRESSION: Diffuse airspace disease in the left lung compatible with pneumonia. Tiny nodular density right base. Consider follow-up chest CT without contrast to further evaluate. Electronically Signed   By: Misty Stanley M.D.   On: 12/29/2018 11:28    Pending Labs Unresulted Labs (From admission, onward)    Start     Ordered   12/30/18  [id number]  Basic metabolic panel  Tomorrow morning,   R     12/29/18 1543   12/30/18 0500  CBC  Tomorrow morning,   R     12/29/18 1543   12/29/18 1541  Hemoglobin A1c  Once,   STAT    Comments: To assess prior glycemic control    12/29/18 1543   12/29/18 1353  SARS CORONAVIRUS 2  Once,   R     12/29/18 1353   12/29/18 1030  Blood Culture (routine x 2)  BLOOD CULTURE X 2,   STAT     12/29/18 1031   12/29/18 1030  Urinalysis, Routine w reflex microscopic  ONCE - STAT,   STAT     12/29/18 1031   12/29/18 1030  Urine culture  ONCE - STAT,   STAT     12/29/18 1031          Vitals/Pain Today's Vitals   12/29/18 1528 12/29/18 1530 12/29/18 1545 12/29/18 1600  BP: 138/66 (!) 143/69 (!) 148/63 (!) 155/68  Pulse:  100 (!) 108 (!) 105  Resp: (!) 22 (!) 23 20 (!) 27  Temp: 99.6 F (37.6 C)     TempSrc: Oral     SpO2: (!) 89% (!) 89% 93% (!) 89%  Weight:      Height:      PainSc:        Isolation Precautions Airborne and Contact precautions  Medications Medications  0.9 %  sodium chloride infusion (has no administration in time range)  sodium chloride 0.9 % bolus 1,000 mL (1,000 mLs Intravenous New Bag/Given 12/29/18 1229)    And  sodium chloride 0.9 % bolus 1,000 mL (has no administration in time range)  vancomycin (VANCOCIN) IVPB 1000 mg/200 mL premix (has no administration in time range)  ceFEPIme (MAXIPIME) 2 g in sodium chloride 0.9 % 100 mL IVPB (has no administration in time range)  bictegravir-emtricitabine-tenofovir AF (BIKTARVY) 50-200-25 MG per tablet 1 tablet (has no administration in time range)  zidovudine (RETROVIR) tablet 300 mg (has no administration in time range)  atorvastatin (LIPITOR) tablet 40 mg (has no administration in time range)  pantoprazole (PROTONIX) EC tablet 40 mg (has no administration in time range)  clopidogrel (PLAVIX) tablet 75 mg (has no administration in time range)  insulin glargine (LANTUS) injection 20 Units (has no administration in time  range)  insulin aspart (novoLOG) injection 0-20 Units (has no administration in time range)  insulin aspart (novoLOG) injection 0-5 Units (has no administration in time range)  enoxaparin (LOVENOX) injection 40 mg (has no administration in time range)  acetaminophen (TYLENOL) tablet 650 mg (has no administration in time range)    Or  acetaminophen (TYLENOL) suppository 650 mg (has no administration in time range)  ondansetron (ZOFRAN) tablet 4 mg (has no administration in time range)    Or  ondansetron (ZOFRAN) injection 4 mg (has no administration in time range)  ceFEPIme (MAXIPIME) 2 g in sodium chloride 0.9 % 100 mL IVPB (0 g Intravenous Stopped 12/29/18 1218)  metroNIDAZOLE (FLAGYL) IVPB 500 mg (0 mg Intravenous Stopped 12/29/18 1343)  sodium chloride 0.9 % bolus 1,000 mL (1,000 mLs Intravenous New Bag/Given 12/29/18 1228)  vancomycin (VANCOCIN) 1,500 mg in sodium chloride 0.9 % 500 mL IVPB (1,500 mg Intravenous New Bag/Given 12/29/18 1227)  acetaminophen (TYLENOL) tablet 650 mg (650 mg Oral Given 12/29/18 1158)    Mobility walks with device High fall risk   Focused Assessments Neuro Assessment Handoff:  Swallow screen pass? Yes          Neuro Assessment:   Neuro Checks:      Last Documented NIHSS Modified Score:   Has TPA been given? No If patient is a Neuro Trauma and patient is going to OR before floor call report to [place] nurse: [phone] or [phone]  , Pulmonary Assessment Handoff:  Lung sounds:   O2 Device: Nasal Cannula O2 Flow Rate (L/min): 4 L/min      R Recommendations: See Admitting Provider Note  Report given to:   Additional Notes: reported fall x 4 days ago; Ck elevated; 4L .

## 2018-12-29 NOTE — ED Triage Notes (Signed)
PT foud at home with a  [employer]  Well check. Pt's friend reported Pt was last  Seen 4 days ago. Pt confirmed he fell in his house 4 days ago and has been unable to get up. Pt was found incont of urine in home. Pt reports he has not had drink or food for 4 Days. PT A/O x4  . EMS BP 139/72 , HR 107 , CBG 351

## 2018-12-29 NOTE — ED Provider Notes (Signed)
[facility] EMERGENCY DEPARTMENT Provider Note   CSN: [id number] Arrival date & time: 12/29/18  2919     History   Chief Complaint Chief Complaint  Patient presents with  . Fall    HPI Nathan [name] is a 68 y.o. male with history of HIV, PVD with right AKA, diabetes, hypertension who presents with generalized weakness and fall.  Patient was found by [employer] well check after his apartment manager had not seen him in a few days.  Apparently, patient has been on his floor after falling for the past 4 days.  He has not eaten or drank anything in that time.  Patient remembers falling, but just could not get up because of his weakness.  He does not hurt anywhere.  He denies any headache, neck pain, back pain, joint pain, abdominal pain, nausea, vomiting, cough.  He is having a little shortness of breath.       HPI  Past Medical History:  Diagnosis Date  . Asthma    per 2003 [facility] pulm records pfts   . Blood dyscrasia    HIV  . Clotting disorder ([place])   . Colon polyps    noted previous colonoscopy [facility]  . DDD (degenerative disc disease)    cervical spine  . Depression   . Diabetes mellitus    dx 2010  . Fever    unkn[place] origin  . GERD (gastroesophageal reflux disease)   . Head swelling 05/23/2017  . Hep C w/o coma, chronic ([place])   . History of syphilis    noted [facility] records  . HIV infection ([place])    undetectable viral load and CD4 ct 667 as of 11/2011  . Hyperlipidemia   . Hypertension   . MRSA (methicillin resistant Staphylococcus aureus)   . Multiple sclerosis ([place])    in remission as of 11/2011 (diagnosed late 1980s)  . Pain in limb-Right Leg 10/13/2013  . 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 Pneumonia   . PVD (peripheral vascular disease) ([place])    Left Stent 07/02/2008  . Scalp lesion 07/26/2017  . UTI (urinary tract infection)   . Wears dentures     Patient Active Problem List   Diagnosis Date Noted  . Cellulitis/Abscess of  Right Anterior Chest 12/04/2018  . History of syphilis 10/09/2013  . Atherosclerosis of native artery of extremity with intermittent claudication ([place]) 04/08/2012  . Occlusion and stenosis of carotid artery without mention of cerebral infarction 12/11/2011  . Phantom limb syndrome (right lower limb) 11/23/2011  . Health care maintenance 11/23/2011  . Status post above knee amputation ([place]) 08/02/2009  . DM type 2 with diabetic peripheral neuropathy ([place]) 04/05/2009  . Benign essential HTN 02/13/2008  . Hyperlipidemia associated with type 2 diabetes mellitus ([place]) 07/29/2007  . Human immunodeficiency virus I infection ([place]) 02/12/2006  . COLONIC POLYPS, ADENOMATOUS 02/12/2006  . Multiple sclerosis ([place]) 02/12/2006  . PERIPHERAL VASCULAR DISEASE 02/12/2006  . GERD 02/12/2006  . Hepatitis B core antibody positive 02/12/2006    Past Surgical History:  Procedure Laterality Date  . ABDOMINAL AORTOGRAM W/LOWER EXTREMITY N/A 08/07/2017   Procedure: ABDOMINAL AORTOGRAM W/LOWER EXTREMITY;  Surgeon: Serafina Mitchell, MD;  Location: [facility];  Service: Cardiovascular;  Laterality: N/A;  . ABOVE KNEE LEG AMPUTATION     Right Leg  . COLONOSCOPY W/ BIOPSIES AND POLYPECTOMY    . LOWER EXTREMITY ANGIOGRAM Left 12/26/2011   Procedure: LOWER EXTREMITY ANGIOGRAM;  Surgeon: Durene Fruits  Pierre Bali, MD;  Location: [facility] CATH LAB;  Service: Cardiovascular;  Laterality: Left;  . LOWER EXTREMITY ANGIOGRAM Left 08/17/2017   Procedure: LOWER EXTREMITY ANGIOGRAM LEFT LEG WITH RUNOFF AND Stenting.;  Surgeon: Serafina Mitchell, MD;  Location: [facility];  Service: Vascular;  Laterality: Left;  Marland Kitchen MULTIPLE TOOTH EXTRACTIONS    . OTHER SURGICAL HISTORY     right lower ext AKA with prothesis   . OTHER SURGICAL HISTORY     left left with stents (Dr. Seward Speck)  . OTHER SURGICAL HISTORY     2003 colonoscopy 5 mm polyp transverse colon; (2)44m  polyps in rectum-hyperplastic  . PERIPHERAL VASCULAR INTERVENTION Left  08/17/2017   Procedure: POPLITEAL STENT;  Surgeon: BSerafina Mitchell MD;  [facility];  Service: Vascular;  Laterality: Left;        Home Medications    Prior to Admission medications   Medication Sig Start Date End Date Taking? Authorizing Provider  atorvastatin (LIPITOR) 40 MG tablet TAKE 1 TABLET(40 MG) BY MOUTH DAILY Patient taking differently: Take 40 mg by mouth daily.  11/13/18   SWelford Roche MD  bictegravir-emtricitabine-tenofovir AF (BIKTARVY) 50-200-25 MG TABS tablet Take 1 tablet by mouth daily. 08/12/18   VTruman Hayward MD  Blood Glucose Monitoring Suppl (BAYER CONTOUR MONITOR) W/DEVICE KIT Use to check blood sugar as instructed up to 4 times a day 07/11/11   BBartholomew Crews MD  clopidogrel (PLAVIX) 75 MG tablet TAKE 1 TABLET BY MOUTH DAILY Patient taking differently: Take 75 mg by mouth daily.  02/12/18   Santos-Sanchez, IMerlene Morse MD  glucose blood (CONTOUR NEXT TEST) test strip 1 each by Other route 4 (four) times daily. Use 4 times daily to check blood sugar. diag code E11.42. 12/18/18   SWelford Roche MD  HUMALOG KWIKPEN 100 UNIT/ML KwikPen Inject 0.22 mLs (22 Units total) into the skin 3 (three) times daily. 12/26/18   SWelford Roche MD  ibuprofen (ADVIL,MOTRIN) 200 MG tablet Take 600 mg by mouth 3 (three) times daily as needed for headache or moderate pain.     [provider]  Insulin Glargine, 2 Unit Dial, (TOUJEO MAX SOLOSTAR) 300 UNIT/ML SOPN Inject 70 Units into the skin at bedtime. 11/11/18   Santos-Sanchez, IMerlene Morse MD  insulin lispro (HUMALOG KWIKPEN) 100 UNIT/ML KiwkPen Inject 0.22 mLs (22 Units total) into the skin 3 (three) times daily with meals. 12/07/17   Santos-Sanchez, IMerlene Morse MD  Insulin Pen Needle (BD PEN NEEDLE NANO U/F) 32G X 4 MM MISC USE AS DIRECTED FOUR TIMES DAILY. Dx code  E11.42 Patient taking differently: See admin instructions. USE AS DIRECTED FOUR TIMES DAILY. Dx code  E11.42 05/10/17   WJule Ser DO   Lancet Devices (BAYER MICROLET 2 LANCING [facility] MISC Use to check blood sugar up to 3 ties a day 04/08/15   WJule Ser DO  liraglutide (VICTOZA) 18 MG/3ML SOPN ADMINISTER 1.8 MG UNDER THE SKIN EVERY MORNING Patient taking differently: Inject 1.8 mg into the skin every morning.  10/21/18   SWelford Roche MD  lisinopril (PRINIVIL,ZESTRIL) 40 MG tablet TAKE 1 TABLET BY MOUTH DAILY Patient taking differently: Take 40 mg by mouth daily.  08/13/18   GAnnia Belt MD  omeprazole (PRILOSEC) 40 MG capsule TAKE ONE CAPSULE BY MOUTH EVERY DAY Patient taking differently: Take 40 mg by mouth daily.  08/05/18   VAxel Filler MD  pioglitazone (ACTOS) 30 MG tablet Take 1 tablet (30 mg total) by mouth daily. 10/08/18   Santos-Sanchez, IMerlene Morse  MD  zidovudine (RETROVIR) 300 MG tablet TAKE 1 TABLET(300 MG) BY MOUTH TWICE DAILY Patient taking differently: Take 300 mg by mouth 2 (two) times daily.  08/12/18   Truman Hayward, MD    Family History Family History  Problem Relation Age of Onset  . Diabetes Mother   . Coronary artery disease Mother   . Cancer Brother        colon caner stage 4 as of 11/2011 (unknown age of onset)  . Coronary artery disease Father   . Prostate cancer Brother   . Colon cancer Brother   . Rectal cancer Neg Hx     Social History Social History   Tobacco Use  . Smoking status: Former Smoker    Quit date: 05/09/2007    Years since quitting: 11.6  . Smokeless tobacco: Former Systems developer    Types: Chew  Substance Use Topics  . Alcohol use: No    Alcohol/week: 0.0 standard drinks  . Drug use: No     Allergies   Sulfonamide derivatives   Review of Systems Review of Systems  Constitutional: Negative for chills and fever (measured here).  HENT: Negative for facial swelling and sore throat.   Respiratory: Positive for shortness of breath. Negative for cough.   Cardiovascular: Negative for chest pain.  Gastrointestinal: Negative for abdominal pain,  nausea and vomiting.  Genitourinary: Negative for dysuria.  Musculoskeletal: Negative for back pain and neck pain.  Skin: Negative for rash and wound.  Neurological: Negative for headaches.  Psychiatric/Behavioral: The patient is not nervous/anxious.      Physical Exam Updated Vital Signs BP (!) 149/64   Pulse (!) 111   Temp (!) 103.6 F (39.8 C) (Rectal)   Resp 18   Ht 6' 1" (1.854 m)   Wt 83 kg   SpO2 93%   BMI 24.14 kg/m   Physical Exam Vitals signs and nursing note reviewed.  Constitutional:      General: He is not in acute distress.    Appearance: He is well-developed. He is not diaphoretic.  HENT:     Head: Normocephalic and atraumatic.     Mouth/Throat:     Pharynx: No oropharyngeal exudate.  Eyes:     General: No scleral icterus.       Right eye: No discharge.        Left eye: No discharge.     Conjunctiva/sclera: Conjunctivae normal.     Pupils: Pupils are equal, round, and reactive to light.  Neck:     Musculoskeletal: Normal range of motion and neck supple.     Thyroid: No thyromegaly.  Cardiovascular:     Rate and Rhythm: Normal rate and regular rhythm.     Heart sounds: Normal heart sounds. No murmur. No friction rub. No gallop.   Pulmonary:     Effort: Tachypnea present. No respiratory distress.     Breath sounds: Normal breath sounds. No stridor. No wheezing or rales.  Abdominal:     General: Bowel sounds are normal. There is no distension.     Palpations: Abdomen is soft.     Tenderness: There is no abdominal tenderness. There is no guarding or rebound.  Musculoskeletal:     Comments: No midline cervical, thoracic, or lumbar tenderness No tenderness on palpation of all joints  Lymphadenopathy:     Cervical: No cervical adenopathy.  Skin:    General: Skin is warm and dry.     Coloration: Skin is not pale.  Findings: No rash.     Comments: Mild jaundice  Neurological:     Mental Status: He is alert.     Coordination: Coordination normal.      Comments: Normal sensation throughout, 4/5 strength in all 4 extremities      ED Treatments / Results  Labs (all labs ordered are listed, but only abnormal results are displayed) Labs Reviewed  LACTIC ACID, PLASMA - Abnormal; Notable for the following components:      Result Value   Lactic Acid, Venous 3.6 (*)    All other components within normal limits  COMPREHENSIVE METABOLIC PANEL - Abnormal; Notable for the following components:   Sodium 134 (*)    Chloride 96 (*)    Glucose, Bld 294 (*)    BUN 32 (*)    Albumin 3.1 (*)    AST 229 (*)    ALT 72 (*)    Total Bilirubin 2.1 (*)    Anion gap 16 (*)    All other components within normal limits  CBC WITH DIFFERENTIAL/PLATELET - Abnormal; Notable for the following components:   RBC 3.20 (*)    Hemoglobin 12.8 (*)    HCT 37.6 (*)    MCV 117.5 (*)    MCH 40.0 (*)    Abs Immature Granulocytes 0.11 (*)    All other components within normal limits  CK - Abnormal; Notable for the following components:   Total CK 5,224 (*)    All other components within normal limits  POCT I-STAT EG7 - Abnormal; Notable for the following components:   pCO2, Ven 41.9 (*)    pO2, Ven 48.0 (*)    Calcium, Ion 1.13 (*)    All other components within normal limits  CULTURE, BLOOD (ROUTINE X 2)  CULTURE, BLOOD (ROUTINE X 2)  URINE CULTURE  SARS CORONAVIRUS 2  APTT  PROTIME-INR  URINALYSIS, ROUTINE W REFLEX MICROSCOPIC  I-STAT VENOUS BLOOD GAS, ED    EKG EKG Interpretation  Date/Time:  Sunday December 29 2018 10:41:02 EDT Ventricular Rate:  110 PR Interval:    QRS Duration: 101 QT Interval:  348 QTC Calculation: 471 R Axis:   87 Text Interpretation:  Sinus tachycardia Probable left atrial enlargement Borderline right axis deviation Confirmed by Pattricia Boss [phone]) on 12/29/2018 11:08:35 AM   Radiology Ct Head Wo Contrast  Result Date: 12/29/2018 CLINICAL DATA:  Fall. Head trauma, minor. GCS greater than or equal to 13. High  clinical risk. Patient was found at home with a [employer] well check. Last seen 4 days ago. Patient fell 4 days ago and was unable to get up. EXAM: CT HEAD WITHOUT CONTRAST TECHNIQUE: Contiguous axial images were obtained from the base of the skull through the vertex without intravenous contrast. COMPARISON:  None. FINDINGS: Brain: No evidence of acute infarction, hemorrhage, hydrocephalus, extra-axial collection or mass lesion/mass effect. Vascular: No hyperdense vessel or unexpected calcification. Skull: Normal. Negative for fracture or focal lesion. Sinuses/Orbits: No acute finding. Other: None. IMPRESSION: Negative exam. Electronically Signed   By: Nolon Nations M.D.   On: 12/29/2018 12:03   Dg Chest Port 1 View  Result Date: 12/29/2018 CLINICAL DATA:  Fall.  Sepsis.  Shortness of breath. EXAM: PORTABLE CHEST 1 VIEW COMPARISON:  01/26/2015 FINDINGS: 1053 hours. Small nodular density noted right lung base superimposed on the posterior right eighth rib. Diffuse airspace disease noted left lung period The cardiopericardial silhouette is within normal limits for size. The visualized bony structures of the thorax are intact. Telemetry  leads overlie the chest. IMPRESSION: Diffuse airspace disease in the left lung compatible with pneumonia. Tiny nodular density right base. Consider follow-up chest CT without contrast to further evaluate. Electronically Signed   By: Misty Stanley M.D.   On: 12/29/2018 11:28    Procedures .Critical Care Performed by: Frederica Kuster, PA-C Authorized by: Frederica Kuster, PA-C   Critical care provider statement:    Critical care time (minutes):  45   Critical care was necessary to treat or prevent imminent or life-threatening deterioration of the following conditions:  Sepsis and hepatic failure   Critical care was time spent personally by me on the following activities:  Discussions with consultants, evaluation of patient's response to treatment, examination of patient,  ordering and performing treatments and interventions, ordering and review of laboratory studies, ordering and review of radiographic studies, pulse oximetry, re-evaluation of patient's condition, obtaining history from patient or surrogate and review of old charts   I assumed direction of critical care for this patient from another provider in my specialty: no     (including critical care time)  Medications Ordered in ED Medications  0.9 %  sodium chloride infusion (has no administration in time range)  vancomycin (VANCOCIN) 1,500 mg in sodium chloride 0.9 % 500 mL IVPB (1,500 mg Intravenous New Bag/Given 12/29/18 1227)  sodium chloride 0.9 % bolus 1,000 mL (1,000 mLs Intravenous New Bag/Given 12/29/18 1229)    And  sodium chloride 0.9 % bolus 1,000 mL (has no administration in time range)  vancomycin (VANCOCIN) IVPB 1000 mg/200 mL premix (has no administration in time range)  ceFEPIme (MAXIPIME) 2 g in sodium chloride 0.9 % 100 mL IVPB (has no administration in time range)  ceFEPIme (MAXIPIME) 2 g in sodium chloride 0.9 % 100 mL IVPB (0 g Intravenous Stopped 12/29/18 1218)  metroNIDAZOLE (FLAGYL) IVPB 500 mg (0 mg Intravenous Stopped 12/29/18 1343)  sodium chloride 0.9 % bolus 1,000 mL (1,000 mLs Intravenous New Bag/Given 12/29/18 1228)  acetaminophen (TYLENOL) tablet 650 mg (650 mg Oral Given 12/29/18 1158)     Initial Impression / Assessment and Plan / ED Course  I have reviewed the triage vital signs and the nursing notes.  Pertinent labs & imaging results that were available during my care of the patient were reviewed by me and considered in my medical decision making (see chart for details).        Patient presenting with generalized weakness and found to be febrile.  Patient with sepsis suspected left lung pneumonia as cause.  Code sepsis called on broad-spectrum antibiotics and fluids initiated, considering patient had no clear cause on arrival.  Lactic acid 3.6.  BUN 32, creatinine  within normal limits.  Elevated AST and ALT at 229 and 72, respectively.  Total bilirubin 2.1.  Anion gap 16.  CK 5224.  CT of the head is negative.  Chest x-ray shows diffuse airspace disease in the left lung compatible with pneumonia as well as a tiny nodular density in the right base.  Radiologist recommends follow-up with chest CT, however will defer to admitting team.  I discussed with the internal medicine teaching service who accepts patient for admission.  I appreciate their assistance with the patient. I discussed patient case with Dr. Jeanell Sparrow who guided the patient's management and agrees with plan.   Final Clinical Impressions(s) / ED Diagnoses   Final diagnoses:  Sepsis with acute liver failure without hepatic coma or septic shock, due to unspecified organism [facility])  Community acquired pneumonia  of left lung, unspecified part of lung  Non-traumatic rhabdomyolysis    ED Discharge Orders    None       Frederica Kuster, PA-C 12/29/18 1426    Pattricia Boss, MD 12/29/18 [phone]

## 2018-12-29 NOTE — Progress Notes (Signed)
Notified bedside nurse of need to draw repeat lactic acid. Nurse stated that second lactic acid was drawn and it was 1.9.

## 2018-12-29 NOTE — Progress Notes (Signed)
Pharmacy Antibiotic Note  Nathan [name] is a 68 y.o. male admitted on 12/29/2018 with sepsis.  Pharmacy has been consulted for vancomycin and cefepime dosing. Pt is febrile with Tmax 103.6 but WBC is WNL. SCr is WNL but slightly above baseline. Lactic acid is elevated.   Plan: Vancomycin 1500mg  IV x 1 then 1gm IV Q12H Cefepime 2gm IV Q8H F/u renal fxn, C&S, clinical status and peak/trough at SS  Height: 6\' 1"  (185.4 cm) Weight: 182 lb 15.7 oz (83 kg) IBW/kg (Calculated) : 79.9  Temp (24hrs), Avg:103.6 F (39.8 C), Min:103.6 F (39.8 C), Max:103.6 F (39.8 C)  No results for input(s): WBC, CREATININE, LATICACIDVEN, VANCOTROUGH, VANCOPEAK, VANCORANDOM, GENTTROUGH, GENTPEAK, GENTRANDOM, TOBRATROUGH, TOBRAPEAK, TOBRARND, AMIKACINPEAK, AMIKACINTROU, AMIKACIN in the last 168 hours.  CrCl cannot be calculated (Patient's most recent lab result is older than the maximum 21 days allowed.).    Allergies  Allergen Reactions  . Sulfonamide Derivatives Hives    Antimicrobials this admission: Vanc 8/23>> Cefepime 8/23>> Flagyl x 1 8/23  Dose adjustments this admission: N/A  Microbiology results: Pending  Thank you for allowing pharmacy to be a part of this patient's care.  Demi Trieu, Rande Lawman 12/29/2018 10:33 AM

## 2018-12-29 NOTE — H&P (Addendum)
Date: 12/29/2018               Patient Name:  Nathan [name] MRN: [id number]  DOB: 11/02/1950 Age / Sex: 68 y.o., male   PCP: Nathan Roche, MD         Medical Service: Internal Medicine Teaching Service         Attending Physician: Dr. Rebeca Alert Raynaldo Opitz, MD    First Contact: Dr. Gilford Rile Pager: [phone]  Second Contact: Dr. Tarri Abernethy Pager: [phone]       After Hours (After 5p/  First Contact Pager: [phone]  weekends / holidays): Second Contact Pager: [phone]   Chief Complaint: Found down  History of Present Illness: Nathan [name] is a 68 year old male with HIV, hypertension, multiple sclerosis, diabetes, and peripheral vascular disease status post right above the knee amputation who was brought to the hospital via EMS after being found down in his apartment. The patient was very confused and limited in memory on our evaluation. Therefore history was obtained both through discussion with the patient and chart review.  Patient states that he was in his usual state of health until this past Monday when he began to experience generalized weakness. He subsequently fell to the floor, he is unsure if he tripped. He states that he was unable to get up and so he remained on the floor for at least four days without food or water. His weakness is present on rest. He reports subjective fevers but denies chest pain, shortness of breath, cough, myalgias, arthralgias, abdominal pain, nausea/vomiting, diarrhea.  Per chart review the patient was found down at his home by the [facility]. The patient's landlord had not seen him in approximately four days and so the police department was called for a wellness check. They subsequently found the patient on the floor with urine on himself. Initial vital signs were a blood pressure of 139/72, heart rate of 107, and CBG at 315.  Meds:  No current facility-administered medications on file prior to encounter.    Current Outpatient  Medications on File Prior to Encounter  Medication Sig Dispense Refill  . atorvastatin (LIPITOR) 40 MG tablet TAKE 1 TABLET(40 MG) BY MOUTH DAILY (Patient taking differently: Take 40 mg by mouth daily. ) 90 tablet 1  . bictegravir-emtricitabine-tenofovir AF (BIKTARVY) 50-200-25 MG TABS tablet Take 1 tablet by mouth daily. 30 tablet 5  . Blood Glucose Monitoring Suppl (BAYER CONTOUR MONITOR) W/DEVICE KIT Use to check blood sugar as instructed up to 4 times a day 1 kit 0  . clopidogrel (PLAVIX) 75 MG tablet TAKE 1 TABLET BY MOUTH DAILY (Patient taking differently: Take 75 mg by mouth daily. ) 90 tablet 3  . glucose blood (CONTOUR NEXT TEST) test strip 1 each by Other route 4 (four) times daily. Use 4 times daily to check blood sugar. diag code E11.42. 375 each 1  . HUMALOG KWIKPEN 100 UNIT/ML KwikPen Inject 0.22 mLs (22 Units total) into the skin 3 (three) times daily. 15 mL 12  . ibuprofen (ADVIL,MOTRIN) 200 MG tablet Take 600 mg by mouth 3 (three) times daily as needed for headache or moderate pain.     . Insulin Glargine, 2 Unit Dial, (TOUJEO MAX SOLOSTAR) 300 UNIT/ML SOPN Inject 70 Units into the skin at bedtime. 5 pen 3  . insulin lispro (HUMALOG KWIKPEN) 100 UNIT/ML KiwkPen Inject 0.22 mLs (22 Units total) into the skin 3 (three) times daily with meals. 30 mL 11  . Insulin Pen Needle (  BD PEN NEEDLE NANO U/F) 32G X 4 MM MISC USE AS DIRECTED FOUR TIMES DAILY. Dx code  E11.42 (Patient taking differently: See admin instructions. USE AS DIRECTED FOUR TIMES DAILY. Dx code  A63.01) 100 each 5  . Lancet Devices (BAYER MICROLET 2 LANCING DEVIC) MISC Use to check blood sugar up to 3 ties a day 1 each 2  . liraglutide (VICTOZA) 18 MG/3ML SOPN ADMINISTER 1.8 MG UNDER THE SKIN EVERY MORNING (Patient taking differently: Inject 1.8 mg into the skin every morning. ) 9 mL 3  . lisinopril (PRINIVIL,ZESTRIL) 40 MG tablet TAKE 1 TABLET BY MOUTH DAILY (Patient taking differently: Take 40 mg by mouth daily. ) 90 tablet  1  . omeprazole (PRILOSEC) 40 MG capsule TAKE ONE CAPSULE BY MOUTH EVERY DAY (Patient taking differently: Take 40 mg by mouth daily. ) 90 capsule 3  . pioglitazone (ACTOS) 30 MG tablet Take 1 tablet (30 mg total) by mouth daily. 90 tablet 0  . zidovudine (RETROVIR) 300 MG tablet TAKE 1 TABLET(300 MG) BY MOUTH TWICE DAILY (Patient taking differently: Take 300 mg by mouth 2 (two) times daily. ) 60 tablet 5   Allergies: Allergies as of 12/29/2018 - Review Complete 12/29/2018  Allergen Reaction Noted  . Sulfonamide derivatives Hives 02/12/2006   Past Medical History:  Diagnosis Date  . Asthma    per 2003 [facility] pulm records pfts   . Blood dyscrasia    HIV  . Clotting disorder ([place])   . Colon polyps    noted previous colonoscopy [facility]  . DDD (degenerative disc disease)    cervical spine  . Depression   . Diabetes mellitus    dx 2010  . Fever    [place] origin  . GERD (gastroesophageal reflux disease)   . Head swelling 05/23/2017  . Hep C w/o coma, chronic ([place])   . History of syphilis    noted [facility] records  . HIV infection ([place])    undetectable viral load and CD4 ct 667 as of 11/2011  . Hyperlipidemia   . Hypertension   . MRSA (methicillin resistant Staphylococcus aureus)   . Multiple sclerosis ([place])    in remission as of 11/2011 (diagnosed late 1980s)  . Pain in limb-Right Leg 10/13/2013  . 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 Pneumonia   . PVD (peripheral vascular disease) ([place])    Left Stent 07/02/2008  . Scalp lesion 07/26/2017  . UTI (urinary tract infection)   . Wears dentures    Family History  Problem Relation Age of Onset  . Diabetes Mother   . Coronary artery disease Mother   . Cancer Brother        colon caner stage 4 as of 11/2011 (unknown age of onset)  . Coronary artery disease Father   . Prostate cancer Brother   . Colon cancer Brother   . Rectal cancer Neg Hx    Social History:  Lives alone. Denies the use of tobacco, EtOH, or illicit  drugs.   Review of Systems: A complete ROS was negative except as per HPI.   Physical Exam: Blood pressure (!) 155/68, pulse (!) 105, temperature 99.6 F (37.6 C), temperature source Oral, resp. rate (!) 27, height _0  (1.854 m), weight 83 kg, SpO2 (!) 89 %.  General: Well nourished male, lethargic HENT: Normocephalic, atraumatic, dry mucus membranes Pulm: Good air movement with crackles on the left  CV: Tachycardic but regular rhythm, no murmurs, no rubs  Abdomen: Active  bowel sounds, soft, non-distended, no tenderness to palpation  Extremities: Pulses palpable in all extremities, no LE edema  Skin: Warm and dry  Neuro: Alert and oriented x 3, cranial nerves intact bilaterally, RUE strength is 2/5, LUE 4/5, LLE 4/5, RLE is above the knee amputation.   EKG: personally reviewed: my interpretation is sinus tachycardia with normal axis and intervals. No ST elevation or new T-wave inversion. Stable compared to prior.  CXR: personally reviewed: my interpretation is good rotation, penetration, and inspiration. No bony or soft tissue abnormalities. Left and right hemidiaphragms are visible with no evidence of pleural effusions. Mediastinum is not widened. No enlargement of the cardiac silhouette. Right lung parenchyma appears normal. Diffuse infiltration of the left lung.  Assessment & Plan by Problem: Principal Problem:   Community acquired pneumonia Active Problems:   Human immunodeficiency virus I infection ([place])   DM type 2 with diabetic peripheral neuropathy ([place])   Fall at home  Elena Cothern is a 68 year old male with HIV, hypertension, multiple sclerosis, diabetes, and peripheral vascular disease status post right above the knee amputation who was brought to the hospital via EMS after being found down in his apartment. He was found to be febrile to 103.6 F, tachycardic, and tachypnic with an relative leukocytosis, lactic acidosis, and of left lung infiltrate. He was subsequently  admitted for further evaluation and management.  Sepsis 2/2 Pneumonia  HAGMA 2/2 Lactic Acidosis (Resolved) - Asthenia with elevated temperature, tachycardia, tachypnea, relative leukocytosis, lactic acidosis, and left lung infiltrate.  - Currently hypoxic requiring 5L/min [place] to maintain SaO2 >90%  - COVID in house test (send out sent earlier) - Procalcitonin  - Urine strep and legionella  - Blood cultures  - S/p 3L NS, add 1L NS bolus followed by maintenance fluids - Switch abx to ceftriaxone and azithromycin   Rhabdomyolysis - Elevated CK of 5224 without AKI. Elevated AST/ALT   - UA with + hemoglobin but negative for RBCs - Continue IVF  - Monitor CK for down trend   HIV  - Last CD4 count 632. HIV viral undetectable  - Continue Biktarvy and Zidovudine   HTN  - Home regimen includes Lisinopril 40 mg QD - Hold Lisinopril   DM - Home regimen is Lantus 70 units QHS and Humalog 22 units TID WC - Start Lantus 20 units BID with SSI  MS - Residual RUE weakness from MS  - No new focal neuro deficits   Diet: Carb mod  VTE ppx: Lovenox  CODE STATUS: DNR  Dispo: Admit patient to Inpatient with expected length of stay greater than 2 midnights.  Signed: Ina Homes, MD 12/29/2018, 4:56 PM

## 2018-12-29 NOTE — Progress Notes (Signed)
Notified bedside nurse of need to draw repeat lactic acid. 

## 2018-12-29 NOTE — Progress Notes (Signed)
Patient arrived to 2W28 late this afternoon. Vital signs included temp 103 (presumably higher) HR 120s, RR mid-20s to low 30s. Immediately increased O2 (placed on HFNC) to 7L to sustain O2 sat in low 90s/upper 80s. ResMD paged, discussed PRN Tylenol, fluids, monitoring RR. Discussed wishes with patient as he is a DNR. He reiterated that he does not want to be intubated or have chest compressions should it come to that. I asked if he would want medications, ie if his heart rate dropped would he want medicine to bring it back up, he stated not for long. Will pass on information to night shift who will continue to monitor.  Ellwood Handler, RN 12/29/18 7:36 PM

## 2018-12-29 NOTE — ED Notes (Signed)
Resident at bedside aware of Sp02 levels. No new orders received at this time; Blood cx collected and sent earlier.

## 2018-12-30 ENCOUNTER — Inpatient Hospital Stay (HOSPITAL_COMMUNITY): Payer: Medicare Other

## 2018-12-30 DIAGNOSIS — J9601 Acute respiratory failure with hypoxia: Secondary | ICD-10-CM | POA: Diagnosis present

## 2018-12-30 DIAGNOSIS — Z79899 Other long term (current) drug therapy: Secondary | ICD-10-CM

## 2018-12-30 DIAGNOSIS — J181 Lobar pneumonia, unspecified organism: Secondary | ICD-10-CM

## 2018-12-30 DIAGNOSIS — A419 Sepsis, unspecified organism: Principal | ICD-10-CM

## 2018-12-30 DIAGNOSIS — Z21 Asymptomatic human immunodeficiency virus [HIV] infection status: Secondary | ICD-10-CM

## 2018-12-30 DIAGNOSIS — E1142 Type 2 diabetes mellitus with diabetic polyneuropathy: Secondary | ICD-10-CM

## 2018-12-30 DIAGNOSIS — I10 Essential (primary) hypertension: Secondary | ICD-10-CM

## 2018-12-30 DIAGNOSIS — G35 Multiple sclerosis: Secondary | ICD-10-CM

## 2018-12-30 DIAGNOSIS — Z794 Long term (current) use of insulin: Secondary | ICD-10-CM

## 2018-12-30 DIAGNOSIS — M6282 Rhabdomyolysis: Secondary | ICD-10-CM

## 2018-12-30 LAB — RESPIRATORY PANEL BY PCR

## 2018-12-30 LAB — COMPREHENSIVE METABOLIC PANEL
ALT: 71 U/L — ABNORMAL HIGH (ref 0–44)
AST: 170 U/L — ABNORMAL HIGH (ref 15–41)
Albumin: 2.3 g/dL — ABNORMAL LOW (ref 3.5–5.0)
Alkaline Phosphatase: 41 U/L (ref 38–126)
Anion gap: 9 (ref 5–15)
BUN: 21 mg/dL (ref 8–23)
CO2: 23 mmol/L (ref 22–32)
Calcium: 8.1 mg/dL — ABNORMAL LOW (ref 8.9–10.3)
Chloride: 106 mmol/L (ref 98–111)
Creatinine, Ser: 0.91 mg/dL (ref 0.61–1.24)
GFR calc Af Amer: >60 mL/min (ref >60)
GFR calc non Af Amer: >60 mL/min (ref >60)
Glucose, Bld: 176 mg/dL — ABNORMAL HIGH (ref 70–99)
Potassium: 3.7 mmol/L (ref 3.5–5.1)
Sodium: 138 mmol/L (ref 135–145)
Total Bilirubin: 1.1 mg/dL (ref 0.3–1.2)
Total Protein: 6.1 g/dL — ABNORMAL LOW (ref 6.5–8.1)

## 2018-12-30 LAB — BLOOD GAS, ARTERIAL
Acid-Base Excess: 0 mmol/L (ref 0.0–2.0)
Bicarbonate: 24 mmol/L (ref 20.0–28.0)
Drawn by: 283401
FIO2: 100
O2 Saturation: 91.1 %
Patient temperature: 100
pCO2 arterial: 39.8 mmHg (ref 32.0–48.0)
pH, Arterial: 7.402 (ref 7.350–7.450)
pO2, Arterial: 62.2 mmHg — ABNORMAL LOW (ref 83.0–108.0)

## 2018-12-30 LAB — URINE CULTURE

## 2018-12-30 LAB — GLUCOSE, CAPILLARY
Glucose-Capillary: 144 mg/dL — ABNORMAL HIGH (ref 70–99)
Glucose-Capillary: 144 mg/dL — ABNORMAL HIGH (ref 70–99)
Glucose-Capillary: 127 mg/dL — ABNORMAL HIGH (ref 70–99)

## 2018-12-30 LAB — CBC
HCT: 30.6 % — ABNORMAL LOW (ref 39.0–52.0)
Hemoglobin: 10.6 g/dL — ABNORMAL LOW (ref 13.0–17.0)
MCH: 39.3 pg — ABNORMAL HIGH (ref 26.0–34.0)
MCHC: 34.6 g/dL (ref 30.0–36.0)
MCV: 113.3 fL — ABNORMAL HIGH (ref 80.0–100.0)
Platelets: 139 10*3/uL — ABNORMAL LOW (ref 150–400)
RBC: 2.7 MIL/uL — ABNORMAL LOW (ref 4.22–5.81)
RDW: 14.3 % (ref 11.5–15.5)
WBC: 7.3 10*3/uL (ref 4.0–10.5)
nRBC: 0 % (ref 0.0–0.2)

## 2018-12-30 LAB — CK: Total CK: 3062 U/L — ABNORMAL HIGH (ref 49–397)

## 2018-12-30 MED ORDER — LACTATED RINGERS IV SOLN
INTRAVENOUS | Status: DC
  Administered 2018-12-30 – 2018-12-31 (×2): via INTRAVENOUS

## 2018-12-30 MED ORDER — LACTATED RINGERS IV BOLUS
1000.0000 mL | Freq: Once | INTRAVENOUS | Status: AC
  Administered 2018-12-30: 1000 mL via INTRAVENOUS

## 2018-12-30 NOTE — Significant Event (Signed)
Rapid Response Event Note  Overview:Called d/t pt SpO2-87-89%, RR-40s on 15L NRB. Asked RN to place pt on 15L HFNC as well as 15L NRB. Time Called: 2331 Arrival Time: 2340 Event Type: Respiratory  Initial Focused Assessment: Pt laying in bed alert x 2, drowsy, confused. Pt says he isn't having trouble breathing however RR-40 with accessory muscle use. T-102.7, HR-117, BP-136/59, RR-40, SpO2-89-90% on 5L NRB. Lungs with scattered rhonchi t/o L > R.  Interventions: HFNC + NRB-SpO2 increasing to 93-94%. Drs. Chundi and Christian to beside to assess pt. Ordered: ABG-7.41/38/59/24,  PCXR-1. Diffuse interstitial and airspace density throughout the left lung similar to prior radiograph. Clinical correlation and follow-up recommended. 2. Probable small left pleural effusion. Move pt to non-covid side of 2W(2 negative covid tests),  Bipap  Plan of Care (if not transferred): Continue to monitor pt. Bipap for tachypnea. Call RRT if further assistance needed. Event Summary: Name of Physician Notified: Dr. Maricela Bo at (PTA RRT)    at          [place], Carren Rang

## 2018-12-30 NOTE — Progress Notes (Signed)
Pt placed on BIpap on documented settings per MD order.   Pt tolerating well.

## 2018-12-30 NOTE — Progress Notes (Signed)
   Subjective: Nathan [name] states that he is "still here." He does not feel good this morning, but is tolerating BiPAP well. Nursing is present at bedside. States that when he woke up he was alert and oriented x 4.   Objective:  Vital signs in last 24 hours: Vitals:   12/30/18 0200 12/30/18 0230 12/30/18 0341 12/30/18 0430  BP:  122/65  (!) 112/54  Pulse: (!) 102  (!) 116   Resp: (!) 25 (!) 26 (!) 35 (!) 28  Temp:    (!) 100.4 F (38 C)  TempSrc:    Axillary  SpO2: 94% 95% 94% 92%  Weight:      Height:       Physical Exam Constitutional:      General: He is in acute distress.     Appearance: He is normal weight. He is toxic-appearing. He is not ill-appearing or diaphoretic.  HENT:     Head: Normocephalic and atraumatic.  Cardiovascular:     Rate and Rhythm: Normal rate.     Pulses: Normal pulses.     Heart sounds: Normal heart sounds. No murmur. No gallop.   Pulmonary:     Breath sounds: No wheezing.     Assessment/Plan:  Principal Problem:   Community acquired pneumonia Active Problems:   Human immunodeficiency virus I infection ([place])   DM type 2 with diabetic peripheral neuropathy ([place])   Fall at home  Sepsis 2/2 Pneumonia  HAGMA 2/2 Lactic Acidosis (Resolved) Hypoxic Respiratory Failure: - Currently hypoxic requiring BiPAP to maintain SaO2 >90%. - COVID in house test: negative - Procalcitonin: 4.09  - Urine strep: negative  - Legionella: pending - Blood cultures: NGTD - Continue Abx to ceftriaxone and azithromycin.  Rhabdomyolysis: - Elevated CK of 3062 without AKI.  - AST/ALT  170/71 - UA with + hemoglobin but negative for RBCs. - D/C IVF. - Continue to monitor CK.   HIV: - Last CD4 count 632. HIV viral undetectable  - Continue Biktarvy and Zidovudine   HTN: - Home regimen includes Lisinopril 40 mg QD. - Hold Lisinopril.   DM: - Home regimen is Lantus 70 units QHS and Humalog 22 units TID WC. - Start Lantus 20 units BID with SSI.  MS:  - Residual RUE weakness from MS. - No new focal neuro deficits.  Dispo: Anticipated discharge pending medical course.   Maudie Mercury, MD 12/30/2018, 6:27 AM Pager: [phone]

## 2018-12-30 NOTE — Progress Notes (Signed)
Patient reassessed, pt VS BP 120/58, RR 27, O2 sats 95% on 15LHFNC & 15LNRB, patient tachypneic with RR 25-29, no complaints of pain, IVF infusing LR @ 136ml/hr, HOB 35 degrees Patient using accessory muscles and has increased work of breathing at rest

## 2018-12-30 NOTE — Progress Notes (Addendum)
Patient was alert and oriented during shift assessment, HR 110s, RR 20s-30s, O2 sats 88-90% 7LHFNC, while in patient's room O2 sats decreased to low 80s increased HFNC to 9L, pt O2 sats 85-87%, increased HFNC to 15L, pt tachyphenic RR 30s, called RT and they reported to transition pt to NRB at 15L. Patient not recovering on the NRB, Lung assessment rhonchi bilateral Upper lobes anteriorly, called Rapid at 2331, they came to the bedside (see progress note). 2345 Dr. Wadie Lessen and Darrick Meigs at bedside to assess pt. Verbal order for Chest Xray and ABG stat. MD wanted pt placed on Bipap and taken off of Isolation precautions for Covid since covid swabx2 was negative. Charge nurse notified of patient's status and updated on the MDs orders and request to move pt and to non covid side

## 2018-12-30 NOTE — Progress Notes (Signed)
Dr. Darrick Meigs called to inform of patient's VS and ABG, CXR. Patient denies SOB, Increased work of breathing and use of accessory muscles,  Alert and oriented x2-3 disoriented to situation. Dr. Darrick Meigs concerned about patient being moved to non Covid side, Rapid RN on unit, patient informed of move to another room. Agricultural consultant given update of patient's status

## 2018-12-30 NOTE — Progress Notes (Signed)
Paged around midnight for declining respiratory status. Patient was previously on 4L [place]. Supplemental O2 needs escalated to 15L NR. Teaching service team not notified until he was desating on the 15L NR at which time Dr. Maricela Bo and I promptly responded and assessed patient at bedside. Patient endorsed shortness of breath but appeared to be breathing fairly comfortably. Denied chest pain. PE: O2 sats were about 92% on the 15L NR. RR 24. HR 120s. BP stable. Febrile at 101.59F. ill appearing but alert and conversational. Rhonchi heard throughout the bilateral lung fields.  Obtained CXR which was read as unchanged from CXR earlier today. Did comment on potential left sided pleural effusion. It did appear somewhat more opacified to--primarily in left lung field. ABG also obtained and did not reveal an acidosis. PH 7.4, pCO2 39, pO2 62, Bicarb 24. COVID result returned and was neg. Patient was subsequently transferred off [place] unit where he would be able to receive BiPAP. VS have improved since that time.  Appreciate RT, rapid response and nursing staff's help and instructed for them to call us should patient's respiratory status decline again. Patient is DNR and therefore should not be intubated if further decline occurs however would consider repeat CXR to assess for progression of the effusion and opacities.

## 2018-12-30 NOTE — Progress Notes (Signed)
Patient was moved to non covid section of the floor to be placed on bipap. Patient was transferred in bed on 15LNRB.  0200 SBAR report given to [facility] charge

## 2018-12-30 NOTE — Progress Notes (Signed)
Pt taken off bipap at this time. Placed on 6L [place], increased to 15L HFNC, eventually switched to a NRB due to pt being a mouth breather. Pt ultimately placed back on bipap due to low spo2. Pt resting comfortably, no increased WOB, no distress noted, denies SOB. RT will continue to closely monitor pt and attempt to wean off bipap this afternoon

## 2018-12-31 DIAGNOSIS — J189 Pneumonia, unspecified organism: Secondary | ICD-10-CM

## 2018-12-31 LAB — BASIC METABOLIC PANEL
Anion gap: 8 (ref 5–15)
BUN: 23 mg/dL (ref 8–23)
CO2: 24 mmol/L (ref 22–32)
Calcium: 8.2 mg/dL — ABNORMAL LOW (ref 8.9–10.3)
Chloride: 107 mmol/L (ref 98–111)
Creatinine, Ser: 0.95 mg/dL (ref 0.61–1.24)
GFR calc Af Amer: >60 mL/min (ref >60)
GFR calc non Af Amer: >60 mL/min (ref >60)
Glucose, Bld: 91 mg/dL (ref 70–99)
Potassium: 3.2 mmol/L — ABNORMAL LOW (ref 3.5–5.1)
Sodium: 139 mmol/L (ref 135–145)

## 2018-12-31 LAB — GLUCOSE, CAPILLARY
Glucose-Capillary: 79 mg/dL (ref 70–99)
Glucose-Capillary: 74 mg/dL (ref 70–99)
Glucose-Capillary: 84 mg/dL (ref 70–99)
Glucose-Capillary: 82 mg/dL (ref 70–99)
Glucose-Capillary: 88 mg/dL (ref 70–99)

## 2018-12-31 LAB — LEGIONELLA PNEUMOPHILA SEROGP 1 UR AG: L. pneumophila Serogp 1 Ur Ag: NEGATIVE

## 2018-12-31 LAB — CBC
HCT: 27.5 % — ABNORMAL LOW (ref 39.0–52.0)
Hemoglobin: 9.2 g/dL — ABNORMAL LOW (ref 13.0–17.0)
MCH: 39 pg — ABNORMAL HIGH (ref 26.0–34.0)
MCHC: 33.5 g/dL (ref 30.0–36.0)
MCV: 116.5 fL — ABNORMAL HIGH (ref 80.0–100.0)
Platelets: 131 10*3/uL — ABNORMAL LOW (ref 150–400)
RBC: 2.36 MIL/uL — ABNORMAL LOW (ref 4.22–5.81)
RDW: 14.7 % (ref 11.5–15.5)
WBC: 4.6 10*3/uL (ref 4.0–10.5)
nRBC: 0 % (ref 0.0–0.2)

## 2018-12-31 LAB — CK: Total CK: 1116 U/L — ABNORMAL HIGH (ref 49–397)

## 2018-12-31 MED ORDER — POTASSIUM CHLORIDE 10 MEQ/100ML IV SOLN
10.0000 meq | INTRAVENOUS | Status: AC
  Administered 2018-12-31 (×5): 10 meq via INTRAVENOUS
  Filled 2018-12-31 (×6): qty 100

## 2018-12-31 MED ORDER — INSULIN GLARGINE 100 UNIT/ML ~~LOC~~ SOLN
10.0000 [IU] | Freq: Two times a day (BID) | SUBCUTANEOUS | Status: DC
  Administered 2019-01-01: 10 [IU] via SUBCUTANEOUS
  Filled 2018-12-31 (×3): qty 0.1

## 2018-12-31 NOTE — Progress Notes (Signed)
   Subjective: Nathan [name] was seen at bedside. He states that he is not doing too well today. He asked if he has a virus and was told his RVP and COVID test were negative. His nurse states that he has not been weaned off BiPAP and his O2 saturation on BiPAP was 90-92. All questions and concerns were addressed.   Objective: Vital signs in last 24 hours: Vitals:   12/31/18 0800 12/31/18 0827 12/31/18 0900 12/31/18 1200  BP: (!) 149/61 (!) 149/61  (!) 141/62  Pulse: 95 93  88  Resp: (!) 26 (!) 24  (!) 22  Temp: 99.1 F (37.3 C)  99.1 F (37.3 C) 99 F (37.2 C)  TempSrc: Axillary  Axillary Axillary  SpO2: (!) 88% 90%  94%  Weight:      Height:      Physical Exam:  - General: Patient on BiPAP, participating in the interview.  - Head: normocephalic, atraumatic  - Cardiovascular: regular rate and rhythm, no murmurs, rubs, gallops - Pulmonary: Rales auscultated bilaterally - Abdomen: Bowel sounds +, no tenderness on palpation.    Assessment/Plan:  Principal Problem:   Acute respiratory failure with hypoxia (HCC) Active Problems:   Human immunodeficiency virus I infection ([place])   DM type 2 with diabetic peripheral neuropathy ([place])   Fall at home   Community acquired pneumonia  Sepsis 2/2 Pneumonia  HAGMA 2/2 Lactic Acidosis (Resolved) Hypoxic Respiratory Failure: - Currently hypoxic requiring BiPAP to maintain SaO2 >90%. - COVID in house test: negative - Procalcitonin: 4.09  - Urine strep: negative  - Legionella: pending - Blood cultures: NGTD - Switched to ceftriaxone and azithromycin to IV.  Rhabdomyolysis: - Elevated CK of 1,116 without AKI.  - AST/ALT  170/71 - UA with + hemoglobin but negative for RBCs. - D/C IVF. - Continue to monitor CK.   HIV: - Last CD4 count 632. HIV viral undetectable  - Continue Biktarvy and Zidovudine   HTN: - Home regimen includes Lisinopril 40 mg QD. - Hold Lisinopril.   DM: - Home regimen is Lantus 70 units QHS and Humalog 22  units TID WC. - Changed Lantus to 10 units BID with SSI.  MS: - Residual RUE weakness from MS. - No new focal neuro deficits.  Dispo: Anticipated discharge pending medical course.   Maudie Mercury, MD 12/31/2018, 12:43 PM Pager: [phone]

## 2019-01-01 DIAGNOSIS — D539 Nutritional anemia, unspecified: Secondary | ICD-10-CM | POA: Diagnosis present

## 2019-01-01 LAB — BASIC METABOLIC PANEL
Anion gap: 10 (ref 5–15)
BUN: 25 mg/dL — ABNORMAL HIGH (ref 8–23)
CO2: 23 mmol/L (ref 22–32)
Calcium: 8.3 mg/dL — ABNORMAL LOW (ref 8.9–10.3)
Chloride: 106 mmol/L (ref 98–111)
Creatinine, Ser: 0.79 mg/dL (ref 0.61–1.24)
GFR calc Af Amer: >60 mL/min (ref >60)
GFR calc non Af Amer: >60 mL/min (ref >60)
Glucose, Bld: 88 mg/dL (ref 70–99)
Potassium: 3.6 mmol/L (ref 3.5–5.1)
Sodium: 139 mmol/L (ref 135–145)

## 2019-01-01 LAB — GLUCOSE, CAPILLARY
Glucose-Capillary: 75 mg/dL (ref 70–99)
Glucose-Capillary: 90 mg/dL (ref 70–99)
Glucose-Capillary: 91 mg/dL (ref 70–99)
Glucose-Capillary: 89 mg/dL (ref 70–99)
Glucose-Capillary: 161 mg/dL — ABNORMAL HIGH (ref 70–99)

## 2019-01-01 LAB — CBC
HCT: 27.8 % — ABNORMAL LOW (ref 39.0–52.0)
Hemoglobin: 9.2 g/dL — ABNORMAL LOW (ref 13.0–17.0)
MCH: 39 pg — ABNORMAL HIGH (ref 26.0–34.0)
MCHC: 33.1 g/dL (ref 30.0–36.0)
MCV: 117.8 fL — ABNORMAL HIGH (ref 80.0–100.0)
Platelets: 125 10*3/uL — ABNORMAL LOW (ref 150–400)
RBC: 2.36 MIL/uL — ABNORMAL LOW (ref 4.22–5.81)
RDW: 15.1 % (ref 11.5–15.5)
WBC: 3.7 10*3/uL — ABNORMAL LOW (ref 4.0–10.5)
nRBC: 0 % (ref 0.0–0.2)

## 2019-01-01 MED ORDER — INSULIN GLARGINE 100 UNIT/ML ~~LOC~~ SOLN
10.0000 [IU] | Freq: Every day | SUBCUTANEOUS | Status: DC
  Administered 2019-01-02 – 2019-01-05 (×3): 10 [IU] via SUBCUTANEOUS
  Filled 2019-01-01 (×4): qty 0.1

## 2019-01-01 NOTE — Progress Notes (Addendum)
   Subjective: Mr. Lefebure was seen at bedside. He states that he is not doing well. He has not been able to come off the BiPAP machine, but was able to sleep well last night. All questions and concerns were addressed.  Objective: Vital signs in last 24 hours: Vitals:   12/31/18 2258 12/31/18 2310 01/01/19 0256 01/01/19 0300  BP:      Pulse:  88  85  Resp:  (!) 24  19  Temp: 98.3 F (36.8 C)  99.7 F (37.6 C)   TempSrc: Axillary  Axillary   SpO2:  90%  94%  Weight:      Height:      Physical Exam:  - General: Patient on BiPAP, participating in the interview, able to answer in full sentences..  - Head: normocephalic, atraumatic.  - Cardiovascular: regular rate and rhythm, no murmurs, rubs, gallops. - Pulmonary: Rales auscultated bilaterally. - Abdomen: Bowel sounds +, no tenderness on palpation.    Assessment/Plan:  Principal Problem:   Acute respiratory failure with hypoxia (HCC) Active Problems:   Human immunodeficiency virus I infection ([place])   DM type 2 with diabetic peripheral neuropathy ([place])   Fall at home   Community acquired pneumonia  Sepsis 2/2 Pneumonia  HAGMA 2/2 Lactic Acidosis (Resolved) Hypoxic Respiratory Failure: - Currently well managed with antibiotic therapy. Fever is trending down and patient is more active than previous interviews.  - Currently hypoxic requiring BiPAP to maintain SaO2 >90%. - COVID in house test: negative - Procalcitonin: 4.09  - Urine strep: negative  - Legionella: negative - Blood cultures: NGTD - RVP: negative - Continue ceftriaxone and azithromycin - Consulted respiratory therapy to evaluate the possibility of weaning him from BiPAP.   Rhabdomyolysis: - Elevated CK levels trending down. - Monitor CK.   HIV: - Last CD4 count 632. HIV viral undetectable.  - Continue Biktarvy and Zidovudine.   HTN: - Home regimen includes Lisinopril 40 mg QD. - Hold Lisinopril.    DM: - Home regimen is Lantus 70 units QHS and Humalog  22 units TID WC. - Lantus to 10 units BID with SSI.   MS: - No new focal neuro deficits.  Dispo: Anticipated discharge pending medical course.   Maudie Mercury, MD 01/01/2019, 7:02 AM Pager: [phone]

## 2019-01-01 NOTE — Plan of Care (Signed)
  Problem: Education: Goal: Knowledge of General Education information will improve Description: Including pain rating scale, medication(s)/side effects and non-pharmacologic comfort measures 01/01/2019 2006 by Dedra Skeens, RN Outcome: Progressing 01/01/2019 2006 by Dedra Skeens, RN Outcome: Progressing   Problem: Clinical Measurements: Goal: Ability to maintain clinical measurements within normal limits will improve 01/01/2019 2006 by Dedra Skeens, RN Outcome: Progressing 01/01/2019 2006 by Dedra Skeens, RN Outcome: Progressing Goal: Will remain free from infection 01/01/2019 2006 by Dedra Skeens, RN Outcome: Progressing 01/01/2019 2006 by Dedra Skeens, RN Outcome: Progressing Goal: Diagnostic test results will improve 01/01/2019 2006 by Dedra Skeens, RN Outcome: Progressing 01/01/2019 2006 by Dedra Skeens, RN Outcome: Progressing Goal: Respiratory complications will improve 01/01/2019 2006 by Dedra Skeens, RN Outcome: Progressing 01/01/2019 2006 by Dedra Skeens, RN Outcome: Progressing Goal: Cardiovascular complication will be avoided 01/01/2019 2006 by Dedra Skeens, RN Outcome: Progressing 01/01/2019 2006 by Dedra Skeens, RN Outcome: Progressing

## 2019-01-02 ENCOUNTER — Inpatient Hospital Stay (HOSPITAL_COMMUNITY): Payer: Medicare Other

## 2019-01-02 DIAGNOSIS — J9601 Acute respiratory failure with hypoxia: Secondary | ICD-10-CM

## 2019-01-02 DIAGNOSIS — M6282 Rhabdomyolysis: Secondary | ICD-10-CM | POA: Diagnosis present

## 2019-01-02 DIAGNOSIS — D531 Other megaloblastic anemias, not elsewhere classified: Secondary | ICD-10-CM

## 2019-01-02 DIAGNOSIS — A419 Sepsis, unspecified organism: Secondary | ICD-10-CM | POA: Diagnosis present

## 2019-01-02 LAB — GLUCOSE, CAPILLARY
Glucose-Capillary: 87 mg/dL (ref 70–99)
Glucose-Capillary: 90 mg/dL (ref 70–99)
Glucose-Capillary: 93 mg/dL (ref 70–99)
Glucose-Capillary: 86 mg/dL (ref 70–99)

## 2019-01-02 LAB — CBC WITH DIFFERENTIAL/PLATELET
Abs Immature Granulocytes: 0.02 10*3/uL (ref 0.00–0.07)
Basophils Absolute: 0 10*3/uL (ref 0.0–0.1)
Basophils Relative: 0 %
Eosinophils Absolute: 0.1 10*3/uL (ref 0.0–0.5)
Eosinophils Relative: 2 %
HCT: 27.9 % — ABNORMAL LOW (ref 39.0–52.0)
Hemoglobin: 9.3 g/dL — ABNORMAL LOW (ref 13.0–17.0)
Immature Granulocytes: 1 %
Lymphocytes Relative: 32 %
Lymphs Abs: 0.9 10*3/uL (ref 0.7–4.0)
MCH: 38.9 pg — ABNORMAL HIGH (ref 26.0–34.0)
MCHC: 33.3 g/dL (ref 30.0–36.0)
MCV: 116.7 fL — ABNORMAL HIGH (ref 80.0–100.0)
Monocytes Absolute: 0.1 10*3/uL (ref 0.1–1.0)
Monocytes Relative: 4 %
Neutro Abs: 1.8 10*3/uL (ref 1.7–7.7)
Neutrophils Relative %: 61 %
Platelets: 142 10*3/uL — ABNORMAL LOW (ref 150–400)
RBC: 2.39 MIL/uL — ABNORMAL LOW (ref 4.22–5.81)
RDW: 14.6 % (ref 11.5–15.5)
WBC: 2.9 10*3/uL — ABNORMAL LOW (ref 4.0–10.5)
nRBC: 0 % (ref 0.0–0.2)

## 2019-01-02 LAB — COMPREHENSIVE METABOLIC PANEL
ALT: 75 U/L — ABNORMAL HIGH (ref 0–44)
AST: 206 U/L — ABNORMAL HIGH (ref 15–41)
Albumin: 1.7 g/dL — ABNORMAL LOW (ref 3.5–5.0)
Alkaline Phosphatase: 53 U/L (ref 38–126)
Anion gap: 10 (ref 5–15)
BUN: 23 mg/dL (ref 8–23)
CO2: 23 mmol/L (ref 22–32)
Calcium: 8.5 mg/dL — ABNORMAL LOW (ref 8.9–10.3)
Chloride: 108 mmol/L (ref 98–111)
Creatinine, Ser: 0.68 mg/dL (ref 0.61–1.24)
GFR calc Af Amer: >60 mL/min (ref >60)
GFR calc non Af Amer: >60 mL/min (ref >60)
Glucose, Bld: 93 mg/dL (ref 70–99)
Potassium: 3.4 mmol/L — ABNORMAL LOW (ref 3.5–5.1)
Sodium: 141 mmol/L (ref 135–145)
Total Bilirubin: 0.9 mg/dL (ref 0.3–1.2)
Total Protein: 5.9 g/dL — ABNORMAL LOW (ref 6.5–8.1)

## 2019-01-02 LAB — RETICULOCYTES
Immature Retic Fract: 4.3 % (ref 2.3–15.9)
RBC.: 2.39 MIL/uL — ABNORMAL LOW (ref 4.22–5.81)
Retic Count, Absolute: 21.7 10*3/uL (ref 19.0–186.0)
Retic Ct Pct: 0.9 % (ref 0.4–3.1)

## 2019-01-02 LAB — TSH: TSH: 1.189 u[IU]/mL (ref 0.350–4.500)

## 2019-01-02 LAB — CK: Total CK: 491 U/L — ABNORMAL HIGH (ref 49–397)

## 2019-01-02 LAB — PATHOLOGIST SMEAR REVIEW

## 2019-01-02 LAB — SAVE SMEAR(SSMR), FOR PROVIDER SLIDE REVIEW

## 2019-01-02 LAB — VITAMIN B12: Vitamin B-12: 246 pg/mL (ref 180–914)

## 2019-01-02 NOTE — Plan of Care (Signed)

## 2019-01-02 NOTE — Progress Notes (Addendum)
   Subjective: Nathan [name] was seen at bedside this morning. He was resting well on BiPAP. He states that he is beginning to feel depressed. We spoke about trying to wean him off the BiPAP machine. All questions and concerns were addressed.  Objective: Vital signs in last 24 hours: Vitals:   01/01/19 2307 01/02/19 0318 01/02/19 0327 01/02/19 0833  BP: 139/67 (!) 130/48  132/60  Pulse:   73 73  Resp:   (!) 21 19  Temp: 98.5 F (36.9 C) 98.4 F (36.9 C)  98.3 F (36.8 C)  TempSrc:    Axillary  SpO2:   93% 93%  Weight:      Height:      Physical Exam:  - General: Patient on BiPAP, participating in the interview, able to answer in full sentences..  - Head: normocephalic, atraumatic.  - Cardiovascular: regular rate and rhythm, no murmurs, rubs, gallops. - Pulmonary: Rales auscultated bilaterally. - Abdomen: Bowel sounds +, no tenderness on palpation.    Assessment/Plan:  Principal Problem:   Acute respiratory failure with hypoxia (HCC) Active Problems:   Human immunodeficiency virus I infection ([place])   DM type 2 with diabetic peripheral neuropathy ([place])   Fall at home   Community acquired pneumonia   Macrocytic anemia  Sepsis 2/2 Pneumonia  HAGMA 2/2 Lactic Acidosis (Resolved) Hypoxic Respiratory Failure: - Currently well managed with antibiotic therapy. Fever is trending down and patient is more active than previous interviews.  - Currently hypoxic requiring BiPAP to maintain SaO2 >90%. - COVID in house test: negative - Procalcitonin: 4.09  - Urine strep: negative  - Legionella: negative - Blood cultures: NGTD - RVP: negative - Continue ceftriaxone and azithromycin. - Attempt to wean off BiPAP.    Megaloblastic Anemia:  MCV: 116.7 - TSH: 1.189 - Retic Ct Pct: 0.9 - Vitamin B12: 246 - Folate: pending - Pathologist smear review: Pending  - Copper: pending  HIV: - Last CD4 count 632. HIV viral undetectable.  - Continue Biktarvy and Zidovudine.   HTN: - Home  regimen includes Lisinopril 40 mg QD. - Hold Lisinopril.    DM: - Home regimen is Lantus 70 units QHS and Humalog 22 units TID WC. - Lantus to 10 units BID with SSI.   MS: - No new focal neuro deficits.  Rhabdomyolysis: Resolved - Elevated CK levels trending down. - CK: 491  Dispo: Anticipated discharge pending medical course.   Maudie Mercury, MD 01/02/2019, 10:32 AM Pager: [phone]

## 2019-01-02 NOTE — Progress Notes (Signed)
  Echocardiogram 2D Echocardiogram has been performed.  Darlina Sicilian M 01/02/2019, 1:02 PM

## 2019-01-02 NOTE — Care Management Important Message (Signed)
Important Message  Patient Details  Name: Nathan [name] MRN: [id number] Date of Birth: March 19, 1951   Medicare Important Message Given:  Yes     Orbie Pyo 01/02/2019, 3:36 PM

## 2019-01-03 DIAGNOSIS — D72819 Decreased white blood cell count, unspecified: Secondary | ICD-10-CM

## 2019-01-03 DIAGNOSIS — D696 Thrombocytopenia, unspecified: Secondary | ICD-10-CM

## 2019-01-03 DIAGNOSIS — D7589 Other specified diseases of blood and blood-forming organs: Secondary | ICD-10-CM

## 2019-01-03 LAB — BASIC METABOLIC PANEL
Anion gap: 12 (ref 5–15)
BUN: 22 mg/dL (ref 8–23)
CO2: 21 mmol/L — ABNORMAL LOW (ref 22–32)
Calcium: 8.3 mg/dL — ABNORMAL LOW (ref 8.9–10.3)
Chloride: 108 mmol/L (ref 98–111)
Creatinine, Ser: 0.58 mg/dL — ABNORMAL LOW (ref 0.61–1.24)
GFR calc Af Amer: >60 mL/min (ref >60)
GFR calc non Af Amer: >60 mL/min (ref >60)
Glucose, Bld: 105 mg/dL — ABNORMAL HIGH (ref 70–99)
Potassium: 3.3 mmol/L — ABNORMAL LOW (ref 3.5–5.1)
Sodium: 141 mmol/L (ref 135–145)

## 2019-01-03 LAB — CBC
HCT: 28.1 % — ABNORMAL LOW (ref 39.0–52.0)
Hemoglobin: 9.3 g/dL — ABNORMAL LOW (ref 13.0–17.0)
MCH: 38.6 pg — ABNORMAL HIGH (ref 26.0–34.0)
MCHC: 33.1 g/dL (ref 30.0–36.0)
MCV: 116.6 fL — ABNORMAL HIGH (ref 80.0–100.0)
Platelets: 156 10*3/uL (ref 150–400)
RBC: 2.41 MIL/uL — ABNORMAL LOW (ref 4.22–5.81)
RDW: 14.4 % (ref 11.5–15.5)
WBC: 2.7 10*3/uL — ABNORMAL LOW (ref 4.0–10.5)
nRBC: 0 % (ref 0.0–0.2)

## 2019-01-03 LAB — BLOOD GAS, ARTERIAL
Acid-Base Excess: 3 mmol/L — ABNORMAL HIGH (ref 0.0–2.0)
Bicarbonate: 26.2 mmol/L (ref 20.0–28.0)
Drawn by: 275531
O2 Content: 15 L/min
O2 Saturation: 84.3 %
Patient temperature: 98.6
pCO2 arterial: 34.8 mmHg (ref 32.0–48.0)
pH, Arterial: 7.49 — ABNORMAL HIGH (ref 7.350–7.450)
pO2, Arterial: 48.5 mmHg — ABNORMAL LOW (ref 83.0–108.0)

## 2019-01-03 LAB — GLUCOSE, CAPILLARY
Glucose-Capillary: 95 mg/dL (ref 70–99)
Glucose-Capillary: 212 mg/dL — ABNORMAL HIGH (ref 70–99)
Glucose-Capillary: 169 mg/dL — ABNORMAL HIGH (ref 70–99)
Glucose-Capillary: 189 mg/dL — ABNORMAL HIGH (ref 70–99)

## 2019-01-03 LAB — CULTURE, BLOOD (ROUTINE X 2)
Culture: NO GROWTH
Culture: NO GROWTH

## 2019-01-03 LAB — FOLATE RBC
Folate, Hemolysate: 285 ng/mL
Folate, RBC: 1149 ng/mL (ref >498)
Hematocrit: 24.8 % — ABNORMAL LOW (ref 37.5–51.0)

## 2019-01-03 MED ORDER — POTASSIUM CHLORIDE CRYS ER 20 MEQ PO TBCR
40.0000 meq | EXTENDED_RELEASE_TABLET | Freq: Once | ORAL | Status: AC
  Administered 2019-01-03: 40 meq via ORAL
  Filled 2019-01-03: qty 2

## 2019-01-03 NOTE — Procedures (Signed)
Patient offered BIPAP tonight but he refused to wear at the time.  Remains on NRB with sats of 93%.

## 2019-01-03 NOTE — Progress Notes (Signed)
Paged md to give ABG results.  Patient PaO2 48 with an SPO2 of 84% on a 15L HFNC.  Placed patient on NRB 100% SPO2 is now 92%.  Awaiting call from MD

## 2019-01-03 NOTE — Progress Notes (Addendum)
Subjective: Pt seen at the bedside this AM on rounds. He's very happy to be off bipap. On high flow [place] O2 and saturations were 87%. Subjectively, he is feeling much better today and his breathing has improved significantly. Endorses a productive cough. No acute complaints today. Says he is feeling hungry. He is oriented to place and time.   Of note, pt had a watery brown stool in the bed. Nurse was called.  Objective:  Vital signs in last 24 hours: Vitals:   01/02/19 2300 01/02/19 2305 01/03/19 0311 01/03/19 0433  BP: (!) 141/62   (!) 138/56  Pulse:  78 74 68  Resp:  19 (!) 22 (!) 21  Temp:    98.2 F (36.8 C)  TempSrc:    Axillary  SpO2:  95% 94% 96%  Weight:      Height:       Physical Exam Vitals signs reviewed.  Constitutional:      General: He is not in acute distress.    Appearance: Normal appearance. He is ill-appearing. He is not toxic-appearing or diaphoretic.  HENT:     Head: Normocephalic and atraumatic.     Nose:     Comments: On high flow [place] Eyes:     General: No scleral icterus.       Right eye: No discharge.        Left eye: No discharge.     Extraocular Movements: Extraocular movements intact.     Conjunctiva/sclera: Conjunctivae normal.  Cardiovascular:     Rate and Rhythm: Normal rate and regular rhythm.     Pulses: Normal pulses.     Heart sounds: No murmur. No friction rub. No gallop.   Pulmonary:     Effort: No respiratory distress.     Comments: Increased respiratory effort. Transmitted upper airway sounds present. Distant lung sounds Abdominal:     General: Abdomen is flat. Bowel sounds are normal. There is no distension.     Palpations: Abdomen is soft.     Tenderness: There is no abdominal tenderness. There is no guarding.  Genitourinary:    Comments: liquid brown stool on the sheets in the bed Neurological:     Mental Status: He is alert.    Assessment/Plan:  Principal Problem:   Acute respiratory failure with hypoxia (HCC) Active  Problems:   Human immunodeficiency virus I infection ([place])   DM type 2 with diabetic peripheral neuropathy ([place])   Fall at home   Community acquired pneumonia   Macrocytic anemia   Non-traumatic rhabdomyolysis   Sepsis with acute liver failure without hepatic coma or septic shock ([place])  In summary, Mr. Heber Antietam is a 68 year old male with a history of well controlled HIV, hep C, MS, T2DM, hypertension and hyperlipidemia who presented initially after being found down in his home.  Upon work-up he was found to be septic secondary to L sided pneumonia.  Sepsis 2/2 Pneumonia  HAGMA 2/2 Lactic Acidosis (Resolved) Hypoxic Respiratory Failure: Patient has been afebrile, however the patient continues to require O2 supplementation.  He has been on BiPAP the last several days, however upon entering the room today the patient was on high flow [place].  O2 saturation were in the high 80s.  Subjectively, the patient says he feels much better today than he has over the past couple days. More interactive today than yesterday. - Currently on high flow [place]. Goal O2 saturation >89%. Will switch back to Bipap if this doesn't improve - COVID in house test:  negative - Procalcitonin: 4.09  - Urine strep: negative  - Legionella: negative - Blood cultures: NGTD - RVP: negative - Continue ceftriaxone and azithromycin (day 4 of abx)   Macrocytic Anemia:  - Pathologist smear review: Leukopenia, Macrocytosis, Thrombocytopenia - TSH: 1.189 - Retic Ct Pct: 0.9 - Vitamin B12: 246 - Folate: pending - Copper: pending  - Methylmalonic acid: pending    HIV: Well controlled. Last CD4 count 632, viral load undetectable. - Continue home Biktarvy and Zidovudine.   HTN: Home regimen includes Lisinopril 40 mg QD. - Hold Lisinopril in the setting of pneumonia    T2DM: - Home regimen is Lantus 70 units QHS and Humalog 22 units TID WC. - Lantus to 10 units daily with SSI-R.  MS: - No new focal neuro deficits.    Rhabdomyolysis: Resolved - Elevated CK levels trending down. - CK: 491 on 8/27, will d/c CK labs.   Dispo: Anticipated discharge pending medical course.   Earlene Plater, MD Internal Medicine, PGY1 Pager: [phone]  01/03/2019,12:00 PM

## 2019-01-04 LAB — BASIC METABOLIC PANEL
Anion gap: 8 (ref 5–15)
BUN: 16 mg/dL (ref 8–23)
CO2: 25 mmol/L (ref 22–32)
Calcium: 8.2 mg/dL — ABNORMAL LOW (ref 8.9–10.3)
Chloride: 106 mmol/L (ref 98–111)
Creatinine, Ser: 0.61 mg/dL (ref 0.61–1.24)
GFR calc Af Amer: >60 mL/min (ref >60)
GFR calc non Af Amer: >60 mL/min (ref >60)
Glucose, Bld: 204 mg/dL — ABNORMAL HIGH (ref 70–99)
Potassium: 3.4 mmol/L — ABNORMAL LOW (ref 3.5–5.1)
Sodium: 139 mmol/L (ref 135–145)

## 2019-01-04 LAB — CBC
HCT: 27.9 % — ABNORMAL LOW (ref 39.0–52.0)
Hemoglobin: 9.4 g/dL — ABNORMAL LOW (ref 13.0–17.0)
MCH: 38.7 pg — ABNORMAL HIGH (ref 26.0–34.0)
MCHC: 33.7 g/dL (ref 30.0–36.0)
MCV: 114.8 fL — ABNORMAL HIGH (ref 80.0–100.0)
Platelets: 180 10*3/uL (ref 150–400)
RBC: 2.43 MIL/uL — ABNORMAL LOW (ref 4.22–5.81)
RDW: 14 % (ref 11.5–15.5)
WBC: 2.8 10*3/uL — ABNORMAL LOW (ref 4.0–10.5)
nRBC: 0 % (ref 0.0–0.2)

## 2019-01-04 LAB — GLUCOSE, CAPILLARY
Glucose-Capillary: 192 mg/dL — ABNORMAL HIGH (ref 70–99)
Glucose-Capillary: 192 mg/dL — ABNORMAL HIGH (ref 70–99)
Glucose-Capillary: 225 mg/dL — ABNORMAL HIGH (ref 70–99)
Glucose-Capillary: 204 mg/dL — ABNORMAL HIGH (ref 70–99)

## 2019-01-04 LAB — COPPER, SERUM: Copper: 102 ug/dL (ref 72–166)

## 2019-01-04 LAB — PROCALCITONIN: Procalcitonin: 0.22 ng/mL

## 2019-01-04 MED ORDER — POTASSIUM CHLORIDE 10 MEQ/100ML IV SOLN
10.0000 meq | INTRAVENOUS | Status: AC
  Administered 2019-01-04 (×4): 10 meq via INTRAVENOUS
  Filled 2019-01-04: qty 100

## 2019-01-04 MED ORDER — GUAIFENESIN ER 600 MG PO TB12
1200.0000 mg | ORAL_TABLET | Freq: Two times a day (BID) | ORAL | Status: DC
  Administered 2019-01-04 – 2019-01-15 (×23): 1200 mg via ORAL
  Filled 2019-01-04 (×23): qty 2

## 2019-01-04 NOTE — Progress Notes (Addendum)
   Subjective: Patient is feeling bad this AM. He is resting comfortably on NRB. We removed the NRB during our conversation and he maintained his SaO2 >88% until he started coughing, at which point he dropped to ~78%. He was placed back on the NRB and his SaO2 improved back to over 88%.   He feels generalize weakness. Feels that he has a lot of chest congestion but does not have the strength to get it out.   Objective: Vital signs in last 24 hours: Vitals:   01/04/19 0000 01/04/19 0420 01/04/19 0425 01/04/19 0805  BP: (!) 152/68 (!) 153/65    Pulse: 83 81 84   Resp: (!) 22 (!) 26 (!) 22   Temp:      TempSrc:      SpO2: 92% 92% 92% 92%  Weight:      Height:       General: Well nourished male in no acute distress Pulm: Good air movement with diffuse rhonchi CV: RRR, no murmurs, no rubs   Assessment/Plan:  In summary, [name] is a 68 year old male with a history of well controlled HIV, hep C, MS, T2DM, hypertension and hyperlipidemia who presented initially after being found down in his home.  Upon work-up he was found to be septic secondary to L sided pneumonia.  Sepsis 2/2 Pneumonia  HAGMA 2/2 Lactic Acidosis (Resolved) Hypoxic Respiratory Failure: - Patient has been afebrile, however the patient continues to require O2 supplementation.  - He is saturating well on NRB, plan to wean to tolerate to maintain SaO2 >88%  - Blood cultures: NGTD - Guaifenesin and flutter valve to help with chest congestion.  - Continue ceftriaxone and azithromycin (Day #7), repeat procal today   Macrocytic Anemia:  - Pathologist smear review: Leukopenia, Macrocytosis, Thrombocytopenia - TSH, Folate, B12, and Copper within normal limits  - Pending MMA  - Possibly related to Zidovudine - Continue to monitor     HIV: Well controlled. Last CD4 count 632, viral load undetectable. - Continue home Biktarvy and Zidovudine.  HTN - Hold Lisinopril in the setting of pneumonia   T2DM: - Home  regimen is Lantus 70 units QHS and Humalog 22 units TID WC. - Lantus to 10 units daily with SSI-R. - CBGs at goal   MS: - No new focal neuro deficits.  Rhabdomyolysis:Resolved  Dispo: Anticipated discharge pending medical course.  Nathan Homes, MD 01/04/2019, 8:10 AM

## 2019-01-05 DIAGNOSIS — E785 Hyperlipidemia, unspecified: Secondary | ICD-10-CM

## 2019-01-05 DIAGNOSIS — B1920 Unspecified viral hepatitis C without hepatic coma: Secondary | ICD-10-CM

## 2019-01-05 LAB — BASIC METABOLIC PANEL
Anion gap: 7 (ref 5–15)
BUN: 12 mg/dL (ref 8–23)
CO2: 25 mmol/L (ref 22–32)
Calcium: 8.2 mg/dL — ABNORMAL LOW (ref 8.9–10.3)
Chloride: 105 mmol/L (ref 98–111)
Creatinine, Ser: 0.56 mg/dL — ABNORMAL LOW (ref 0.61–1.24)
GFR calc Af Amer: >60 mL/min (ref >60)
GFR calc non Af Amer: >60 mL/min (ref >60)
Glucose, Bld: 213 mg/dL — ABNORMAL HIGH (ref 70–99)
Potassium: 3.8 mmol/L (ref 3.5–5.1)
Sodium: 137 mmol/L (ref 135–145)

## 2019-01-05 LAB — GLUCOSE, CAPILLARY
Glucose-Capillary: 210 mg/dL — ABNORMAL HIGH (ref 70–99)
Glucose-Capillary: 193 mg/dL — ABNORMAL HIGH (ref 70–99)
Glucose-Capillary: 196 mg/dL — ABNORMAL HIGH (ref 70–99)
Glucose-Capillary: 158 mg/dL — ABNORMAL HIGH (ref 70–99)

## 2019-01-05 LAB — CBC
HCT: 27.5 % — ABNORMAL LOW (ref 39.0–52.0)
Hemoglobin: 9.2 g/dL — ABNORMAL LOW (ref 13.0–17.0)
MCH: 38.7 pg — ABNORMAL HIGH (ref 26.0–34.0)
MCHC: 33.5 g/dL (ref 30.0–36.0)
MCV: 115.5 fL — ABNORMAL HIGH (ref 80.0–100.0)
Platelets: 214 10*3/uL (ref 150–400)
RBC: 2.38 MIL/uL — ABNORMAL LOW (ref 4.22–5.81)
RDW: 13.8 % (ref 11.5–15.5)
WBC: 2.9 10*3/uL — ABNORMAL LOW (ref 4.0–10.5)
nRBC: 0 % (ref 0.0–0.2)

## 2019-01-05 MED ORDER — INSULIN GLARGINE 100 UNIT/ML ~~LOC~~ SOLN
10.0000 [IU] | Freq: Two times a day (BID) | SUBCUTANEOUS | Status: DC
  Administered 2019-01-05 – 2019-01-08 (×6): 10 [IU] via SUBCUTANEOUS
  Filled 2019-01-05 (×7): qty 0.1

## 2019-01-05 MED ORDER — LISINOPRIL 40 MG PO TABS
40.0000 mg | ORAL_TABLET | Freq: Every day | ORAL | Status: DC
  Administered 2019-01-05 – 2019-01-12 (×6): 40 mg via ORAL
  Filled 2019-01-05 (×9): qty 1

## 2019-01-05 NOTE — Progress Notes (Signed)
Received alert and well related wo complaints, VSS afebrile, continued on close observation,.

## 2019-01-05 NOTE — Progress Notes (Signed)
   Subjective: Patient is feeling okay this AM. He continues to have a cough but he feels it is significantly improved. He is using the flutter valve. We discussed discontinuing his antibiotics today and continuing to wean his oxygen as tolerated. All questions and concerns addressed.   Objective: Vital signs in last 24 hours: Vitals:   01/04/19 2359 01/05/19 0000 01/05/19 0300 01/05/19 0600  BP: (!) 148/58  139/73   Pulse: 77     Resp: 20     Temp: 98.6 F (37 C) 98.6 F (37 C) 99.4 F (37.4 C)   TempSrc: Oral Oral Axillary   SpO2: 92%  90% 92%  Weight:      Height:       General: Well nourished male in no acute distress Pulm: Good air movement with a significant improvement in his diffuse rhonchi  CV: RRR, no murmurs, no rubs   Assessment/Plan:  In summary, Nathan [name]  is a 68 year old male with a history of well controlled HIV, hep C, MS, T2DM, hypertension and hyperlipidemia who presented initially after being found down in his home.  Upon work-up he was found to be septic secondary to L sided pneumonia.  Sepsis 2/2 Pneumonia  HAGMA 2/2 Lactic Acidosis (Resolved) Hypoxic Respiratory Failure: - Patient has been afebrile, however the patient continues to require O2 supplementation.  - He is saturating well on HFNC 15L/min, plan to wean to tolerate to maintain SaO2 >88%  - Blood cultures: NGTD - Guaifenesin and flutter valve to help with chest congestion.  - Repeat Procalcitonin 0.22 down from >4.0. We will discontinue his antibiotics today.   Macrocytic Anemia:  - Pending MMA  - Possibly related to Zidovudine - Continue to monitor     HIV: Well controlled. Last CD4 count 632, viral load undetectable. - Continue home Biktarvy and Zidovudine.  HTN - Restart home lisinopril 40 mg QHS  T2DM: - Home regimen is Lantus 70 units QHS and Humalog 22 units TID WC. - Tolerating PO intake now. Will increase Lantus to 10 units BID with SSI  MS: - No new focal neuro  deficits.  Rhabdomyolysis:Resolved  Dispo: Anticipated discharge pending medical course.  Nathan Homes, MD 01/05/2019, 6:49 AM

## 2019-01-05 NOTE — Progress Notes (Signed)
  Date: 01/05/2019  Patient name: Nathan [name]  Medical record number: [id number]  Date of birth: [birth year]        I have seen and evaluated this patient and I have discussed the plan of care with the house staff. Please see their note for complete details. I concur with their findings.  Bartholomew Crews, MD 01/05/2019, 2:16 PM

## 2019-01-05 NOTE — Plan of Care (Signed)
  Problem: Education: Goal: Knowledge of General Education information will improve Description Including pain rating scale, medication(s)/side effects and non-pharmacologic comfort measures Outcome: Progressing   Problem: Health Behavior/Discharge Planning: Goal: Ability to manage health-related needs will improve Outcome: Progressing   

## 2019-01-05 NOTE — Progress Notes (Signed)
Patient refusing BIPAP per respiratory converted to Non rebreather to maintain SATs greater than 90-no ,overt dyspnea or distress, sleeping comfortably at present.

## 2019-01-05 NOTE — Plan of Care (Signed)
  Problem: Education: Goal: Knowledge of General Education information will improve Description: Including pain rating scale, medication(s)/side effects and non-pharmacologic comfort measures Outcome: Progressing   Problem: Clinical Measurements: Goal: Ability to maintain clinical measurements within normal limits will improve Outcome: Progressing Goal: Will remain free from infection Outcome: Progressing   

## 2019-01-06 ENCOUNTER — Encounter: Payer: Medicare Other | Admitting: Internal Medicine

## 2019-01-06 DIAGNOSIS — J9691 Respiratory failure, unspecified with hypoxia: Secondary | ICD-10-CM

## 2019-01-06 DIAGNOSIS — E119 Type 2 diabetes mellitus without complications: Secondary | ICD-10-CM

## 2019-01-06 LAB — BASIC METABOLIC PANEL
Anion gap: 8 (ref 5–15)
BUN: 14 mg/dL (ref 8–23)
CO2: 25 mmol/L (ref 22–32)
Calcium: 8.4 mg/dL — ABNORMAL LOW (ref 8.9–10.3)
Chloride: 101 mmol/L (ref 98–111)
Creatinine, Ser: 0.58 mg/dL — ABNORMAL LOW (ref 0.61–1.24)
GFR calc Af Amer: >60 mL/min (ref >60)
GFR calc non Af Amer: >60 mL/min (ref >60)
Glucose, Bld: 197 mg/dL — ABNORMAL HIGH (ref 70–99)
Potassium: 3.8 mmol/L (ref 3.5–5.1)
Sodium: 134 mmol/L — ABNORMAL LOW (ref 135–145)

## 2019-01-06 LAB — CBC
HCT: 28.2 % — ABNORMAL LOW (ref 39.0–52.0)
Hemoglobin: 9.5 g/dL — ABNORMAL LOW (ref 13.0–17.0)
MCH: 39.1 pg — ABNORMAL HIGH (ref 26.0–34.0)
MCHC: 33.7 g/dL (ref 30.0–36.0)
MCV: 116 fL — ABNORMAL HIGH (ref 80.0–100.0)
Platelets: 228 10*3/uL (ref 150–400)
RBC: 2.43 MIL/uL — ABNORMAL LOW (ref 4.22–5.81)
RDW: 13.7 % (ref 11.5–15.5)
WBC: 3.5 10*3/uL — ABNORMAL LOW (ref 4.0–10.5)
nRBC: 0 % (ref 0.0–0.2)

## 2019-01-06 LAB — GLUCOSE, CAPILLARY
Glucose-Capillary: 189 mg/dL — ABNORMAL HIGH (ref 70–99)
Glucose-Capillary: 235 mg/dL — ABNORMAL HIGH (ref 70–99)
Glucose-Capillary: 289 mg/dL — ABNORMAL HIGH (ref 70–99)
Glucose-Capillary: 212 mg/dL — ABNORMAL HIGH (ref 70–99)

## 2019-01-06 LAB — METHYLMALONIC ACID, SERUM: Methylmalonic Acid, Quantitative: 134 nmol/L (ref 0–378)

## 2019-01-06 MED ORDER — IPRATROPIUM-ALBUTEROL 0.5-2.5 (3) MG/3ML IN SOLN
3.0000 mL | Freq: Four times a day (QID) | RESPIRATORY_TRACT | Status: DC
  Administered 2019-01-06: 3 mL via RESPIRATORY_TRACT
  Filled 2019-01-06 (×2): qty 3

## 2019-01-06 NOTE — Progress Notes (Signed)
Resting comfortably, no apparent distress, Oxygen sat maintained at 90% on 15L HF-[place], Unlabored. Repositioned an made comfortable as per routine, voiding freely -condom cath in place.no complaints offered.

## 2019-01-06 NOTE — Evaluation (Signed)
Physical Therapy Evaluation Patient Details Name: Nathan [name] MRN: [id number] DOB: 1950-09-29 Today's Date: 01/06/2019   History of Present Illness  Pt fell at home and was on floor x 4 days without food or water. Pt found to be septic with PNA and have rhabdomyolosis. Pt has required bipap and HFNC. PMH - rt AKA, HTN, DM, PVD, MS, HIV  Clinical Impression  Pt presents with severe limitations in mobility. Pt lives alone and feel he will need extended rehab to return to independence.     Follow Up Recommendations SNF;Supervision/Assistance - 24 hour    Equipment Recommendations  Other (comment)(To be determined)    Recommendations for Other Services       Precautions / Restrictions Precautions Precautions: Fall Precaution Comments: watch SpO2 Restrictions Weight Bearing Restrictions: No      Mobility  Bed Mobility Overal bed mobility: Needs Assistance Bed Mobility: Supine to Sit;Sit to Supine     Supine to sit: +2 for physical assistance;Total assist Sit to supine: +2 for physical assistance;Total assist   General bed mobility comments: Assist for all aspects  Transfers                    Ambulation/Gait                Stairs            Wheelchair Mobility    Modified Rankin (Stroke Patients Only)       Balance Overall balance assessment: Needs assistance Sitting-balance support: Bilateral upper extremity supported;Feet supported Sitting balance-Leahy Scale: Poor Sitting balance - Comments: Sat EOB x 7-8 minutes with min to mod assist. Pt on 15L HFNC with SpO2 84-88%. Postural control: Posterior lean                                   Pertinent Vitals/Pain Pain Assessment: No/denies pain    Home Living Family/patient expects to be discharged to:: Private residence Living Arrangements: Alone   Type of Home: Apartment Home Access: Stairs to enter Entrance Stairs-Rails: Right Entrance Stairs-Number of Steps:  14 Home Layout: One level        Prior Function Level of Independence: Independent with assistive device(s)         Comments: Uses prosthesis for mobility     Hand Dominance   Dominant Hand: Right    Extremity/Trunk Assessment   Upper Extremity Assessment Upper Extremity Assessment: Defer to OT evaluation    Lower Extremity Assessment Lower Extremity Assessment: Generalized weakness;RLE deficits/detail;LLE deficits/detail RLE Deficits / Details: AKA and strength <2/5 LLE Deficits / Details: Strength <2/5       Communication   Communication: HOH  Cognition Arousal/Alertness: Awake/alert Behavior During Therapy: WFL for tasks assessed/performed Overall Cognitive Status: Impaired/Different from baseline Area of Impairment: Memory;Following commands;Problem solving                     Memory: Decreased short-term memory Following Commands: Follows one step commands with increased time     Problem Solving: Slow processing;Decreased initiation;Requires verbal cues        General Comments      Exercises     Assessment/Plan    PT Assessment Patient needs continued PT services  PT Problem List Decreased strength;Decreased activity tolerance;Decreased balance;Decreased mobility;Cardiopulmonary status limiting activity       PT Treatment Interventions DME instruction;Gait training;Functional mobility training;Therapeutic activities;Therapeutic exercise;Balance training;Patient/family education  PT Goals (Current goals can be found in the Care Plan section)  Acute Rehab PT Goals Patient Stated Goal: go home PT Goal Formulation: With patient Time For Goal Achievement: 01/20/19 Potential to Achieve Goals: Good    Frequency Min 3X/week   Barriers to discharge Inaccessible home environment;Decreased caregiver support Lives alone and has stairs to enter    Co-evaluation               AM-PAC PT "6 Clicks" Mobility  Outcome Measure Help  needed turning from your back to your side while in a flat bed without using bedrails?: Total Help needed moving from lying on your back to sitting on the side of a flat bed without using bedrails?: Total Help needed moving to and from a bed to a chair (including a wheelchair)?: Total Help needed standing up from a chair using your arms (e.g., wheelchair or bedside chair)?: Total Help needed to walk in hospital room?: Total Help needed climbing 3-5 steps with a railing? : Total 6 Click Score: 6    End of Session Equipment Utilized During Treatment: Oxygen Activity Tolerance: Patient limited by fatigue Patient left: in bed;with call bell/phone within reach;with bed alarm set   PT Visit Diagnosis: Other abnormalities of gait and mobility (R26.89);History of falling (Z91.81);Muscle weakness (generalized) (M62.81)    Time: [id number] PT Time Calculation (min) (ACUTE ONLY): 17 min   Charges:   PT Evaluation $PT Eval Moderate Complexity: [place] Pager [phone] Office [place] 01/06/2019, 3:43 PM

## 2019-01-06 NOTE — Progress Notes (Addendum)
   Subjective: Nathan [name] was seen at bedside this morning. He states that he is tired and that his lower back pain is bothering him. He states that he slept "okay" last night. He asked if he could go home. We spoke with him about his current oxygen requirement, and how it would not be feasible to discharge with his current oxygen regime. We did speak about the regular use of his flutter valve in strengthening his lungs. He voiced understanding. All questions and concerns were addressed.   Objective: Vital signs in last 24 hours: Vitals:   01/06/19 0336 01/06/19 0400 01/06/19 0600 01/06/19 0820  BP: (!) 115/58   (!) 119/56  Pulse: 80     Resp: 19     Temp: 98.6 F (37 C) 98.6 F (37 C)  98.2 F (36.8 C)  TempSrc: Oral Oral  Oral  SpO2: 90%     Weight:   73 kg   Height:       General: Well nourished male in no acute distress. Pulm: Good air movement with diffuse rhonchi auscultated bilaterally.  CV: RRR, no murmurs, rubs,or gallops.    Assessment/Plan: Sepsis 2/2 Pneumonia  HAGMA 2/2 Lactic Acidosis (Resolved) Hypoxic Respiratory Failure: - Patient has been afebrile, however the patient continues to require O2 supplementation.  - He is saturating well on HFNC 15L/min, plan to wean to tolerate to maintain SaO2 >88%. Was saturating from 88-90% during the interview.   - Blood cultures: NGTD - Guaifenesin and flutter valve to help with chest congestion.   - Ordered incentive spirometry.  - Ordered pulmonary toilet.   Macrocytic Anemia:  - Possibly related to Zidovudine - Pending MMA  - Continue to monitor     HIV: - Well controlled. Last CD4 count 632, viral load undetectable. - Continue home Biktarvy and Zidovudine.  HTN - Restart home lisinopril 40 mg QHS  T2DM: - Home regimen is Lantus 70 units QHS and Humalog 22 units TID WC. - Tolerating PO intake now. Will increase Lantus to 10 units BID with SSI  MS: - No new focal neuro deficits.   Rhabdomyolysis:Resolved  Dispo: Anticipated discharge pending medical course.  Maudie Mercury, MD 01/06/2019, 11:31 AM

## 2019-01-06 NOTE — Progress Notes (Signed)
Complained of oxygen drying out nares, periodically removing O2-reinforced to patient importance of oxygen to maintain saturation.

## 2019-01-07 DIAGNOSIS — Z89611 Acquired absence of right leg above knee: Secondary | ICD-10-CM

## 2019-01-07 DIAGNOSIS — J918 Pleural effusion in other conditions classified elsewhere: Secondary | ICD-10-CM

## 2019-01-07 DIAGNOSIS — D509 Iron deficiency anemia, unspecified: Secondary | ICD-10-CM

## 2019-01-07 LAB — CBC
HCT: 26.6 % — ABNORMAL LOW (ref 39.0–52.0)
Hemoglobin: 9 g/dL — ABNORMAL LOW (ref 13.0–17.0)
MCH: 38.8 pg — ABNORMAL HIGH (ref 26.0–34.0)
MCHC: 33.8 g/dL (ref 30.0–36.0)
MCV: 114.7 fL — ABNORMAL HIGH (ref 80.0–100.0)
Platelets: 235 10*3/uL (ref 150–400)
RBC: 2.32 MIL/uL — ABNORMAL LOW (ref 4.22–5.81)
RDW: 13.8 % (ref 11.5–15.5)
WBC: 3.9 10*3/uL — ABNORMAL LOW (ref 4.0–10.5)
nRBC: 0 % (ref 0.0–0.2)

## 2019-01-07 LAB — GLUCOSE, CAPILLARY
Glucose-Capillary: 197 mg/dL — ABNORMAL HIGH (ref 70–99)
Glucose-Capillary: 205 mg/dL — ABNORMAL HIGH (ref 70–99)
Glucose-Capillary: 219 mg/dL — ABNORMAL HIGH (ref 70–99)
Glucose-Capillary: 250 mg/dL — ABNORMAL HIGH (ref 70–99)

## 2019-01-07 MED ORDER — IPRATROPIUM-ALBUTEROL 0.5-2.5 (3) MG/3ML IN SOLN
3.0000 mL | Freq: Three times a day (TID) | RESPIRATORY_TRACT | Status: DC
  Administered 2019-01-07 – 2019-01-14 (×23): 3 mL via RESPIRATORY_TRACT
  Filled 2019-01-07 (×25): qty 3

## 2019-01-07 MED ORDER — IBUPROFEN 600 MG PO TABS
600.0000 mg | ORAL_TABLET | Freq: Four times a day (QID) | ORAL | Status: DC | PRN
  Administered 2019-01-07: 600 mg via ORAL
  Filled 2019-01-07 (×2): qty 1

## 2019-01-07 NOTE — Progress Notes (Signed)
Brother requesting updates from doctor his number [phone] Nathan [name]

## 2019-01-07 NOTE — Progress Notes (Signed)
Subjective: Patient seen at the bedside on rounds this morning.  Patient appears to have low energy, but responds to questions appropriately.  Eyes are mostly closed during her visit.  Patient says he has pain all over his body 8/10.  Unable to characterize.  We reinforced the importance of having the patient participate in physical therapy and trying to move and get out of bed in order to open up his lungs. Pt says he wants to go home but we explained he cannot until his O2 requirement goes down.   Objective:  Vital signs in last 24 hours: Vitals:   01/07/19 0740 01/07/19 0755 01/07/19 1143 01/07/19 1144  BP:  (!) 106/52 (!) 105/54   Pulse:  85 96 97  Resp:  (!) 24 (!) 34   Temp:  98.2 F (36.8 C) (!) 100.7 F (38.2 C) 98.1 F (36.7 C)  TempSrc:  Oral Oral Oral  SpO2: (!) 89% 90% (!) 88% (!) 89%  Weight:      Height:       Physical Exam Vitals signs reviewed.  Constitutional:      General: He is not in acute distress.    Appearance: He is ill-appearing and diaphoretic.     Comments: Tired appearing  HENT:     Head: Normocephalic.  Eyes:     General: No scleral icterus.       Right eye: No discharge.        Left eye: No discharge.     Extraocular Movements: Extraocular movements intact.  Cardiovascular:     Rate and Rhythm: Normal rate and regular rhythm.     Pulses: Normal pulses.     Heart sounds: Normal heart sounds. No murmur. No friction rub. No gallop.   Pulmonary:     Effort: Respiratory distress present.     Breath sounds: Normal breath sounds. No wheezing.     Comments: Diminished lung sounds at bilateral lung bases L worse than R Abdominal:     General: Abdomen is flat. Bowel sounds are normal. There is no distension.     Palpations: Abdomen is soft.     Tenderness: There is no abdominal tenderness. There is no guarding.  Musculoskeletal:     Left lower leg: No edema.     Comments: R AKA, well healed  Skin:    General: Skin is warm.  Neurological:   General: No focal deficit present.     Mental Status: He is alert and oriented to person, place, and time.     Motor: Weakness (diffuse generalized weakness) present.  Psychiatric:     Comments: Sad affect     Assessment/Plan:  Principal Problem:   Acute respiratory failure with hypoxia (HCC) Active Problems:   Human immunodeficiency virus I infection ([place])   DM type 2 with diabetic peripheral neuropathy ([place])   Fall at home   Community acquired pneumonia   Macrocytic anemia   Non-traumatic rhabdomyolysis   Sepsis with acute liver failure without hepatic coma or septic shock ([place])  In summary, [name] is a 68 year old gentleman with a history of HIV,T2DM, MS, microcytic anemia who presented after being found down at his home for several days.  Work-up was notable for left lower lobe pneumonia and sepsis and rhabdomyolysis. He has required BiPap for most of his stay, but he is now on HFNC 15L/ min with saturations in the high 80s, low 90s.  #LLL pneumonia #Hypoxia: S/p 7 day course of ceftriaxone + azithro.The patient has  remained afebrile, however the patient continues to require O2 supplementation.  Has required BiPAP mostly but has been weaned to HFNC 15 L/min.  O2 saturations seem to be in the upper 80s and low 90s. Patient has struggled to participate in physical therapy due to low oxygen saturation. Low motivation to get out of bed. - Talked to nursing about having pt sitting up in the chair at bedside instead of laying in bed - Encouraged pt to use incentive spiro, pulmonary toilet, & flutter valve. Also encouraged to get out of bed and work with the therapists.  - Continue working with RT   MacrocyticAnemia:  - Possibly related to antiretroviral medications. - Folate, B12, MMA wnl - Continue to monitor   HIV: -Well controlled. Last CD4 count 632, viral load undetectable since 2013. - ContinuehomeBiktarvy and Zidovudine.  HTN - Lisinopril 40 mg QHS  T2DM:  Home regimen is Lantus 70U qhs and humalog 22 u TID - Lantus to 10 units BID with SSI  Rhabdomyolysis:Resolved  Dispo: Anticipated discharge pending medical course.  Earlene Plater, MD Internal Medicine, PGY1 Pager: [phone]  01/07/2019,1:16 PM

## 2019-01-07 NOTE — Evaluation (Signed)
Occupational Therapy Evaluation Patient Details Name: Nathan [name] MRN: [id number] DOB: 20-Jan-1951 Today's Date: 01/07/2019    History of Present Illness Pt fell at home and was on floor x 4 days without food or water. Pt found to be septic with PNA and have rhabdomyolosis. Pt has required bipap and HFNC. PMH - rt AKA, HTN, DM, PVD, MS, HIV   Clinical Impression   PT admitted with septic PNA. Pt currently with functional limitiations due to the deficits listed below (see OT problem list). Pt currently requires total +2 for basic bed mobility and decrease sustain attention. Pt greatly limited by ability to stay alert to continue to participate Pt will benefit from skilled OT to increase their independence and safety with adls and balance to allow discharge SNF.     Follow Up Recommendations  SNF    Equipment Recommendations  Other (comment)(TBA)    Recommendations for Other Services       Precautions / Restrictions Precautions Precautions: Fall Precaution Comments: watch SpO2      Mobility Bed Mobility Overal bed mobility: Needs Assistance Bed Mobility: Supine to Sit;Sit to Supine     Supine to sit: +2 for physical assistance;Max assist;HOB elevated Sit to supine: +2 for physical assistance;Max assist;HOB elevated   General bed mobility comments: requires (A) for all aspects. pt unable to sequence task  Transfers                 General transfer comment: no prosthetic and unable to demonstrate transfer due to attention    Balance     Sitting balance-Leahy Scale: Poor                                     ADL either performed or assessed with clinical judgement   ADL Overall ADL's : Needs assistance/impaired Eating/Feeding: Minimal assistance;Bed level   Grooming: Wash/dry face;Moderate assistance;Bed level   Upper Body Bathing: Total assistance   Lower Body Bathing: Total assistance   Upper Body Dressing : Total assistance   Lower  Body Dressing: Total assistance   Toilet Transfer: Total assistance             General ADL Comments: pt sitting eob for 9 minutes 15 L HFNC max (A) progressing to min guard for 3 minutes with neck flexed. pt needed constant cues and tactile input to stay engaged in static sitting. pt does demonstrate attempt at reach outside of base to place food item on tray      Vision         Perception     Praxis      Pertinent Vitals/Pain Pain Assessment: No/denies pain     Hand Dominance Right   Extremity/Trunk Assessment Upper Extremity Assessment Upper Extremity Assessment: RUE deficits/detail RUE Deficits / Details: shoulder flexion 3 out 5 but can not sustain against gravity, elbow flexion WFL 3 out 5 strength, grasp cylincial very weak and unabel to sustain a untensil. could benefit from red foam next session RUE Coordination: decreased fine motor;decreased gross motor   Lower Extremity Assessment Lower Extremity Assessment: Defer to PT evaluation   Cervical / Trunk Assessment Cervical / Trunk Assessment: Kyphotic;Other exceptions(unable to sustain neck extension) Cervical / Trunk Exceptions: pt demonstrates neck weakness and unable to sustain neck extension.    Communication Communication Communication: HOH   Cognition Arousal/Alertness: Lethargic Behavior During Therapy: Flat affect Overall Cognitive Status: Impaired/Different from baseline Area  of Impairment: Attention;Memory;Following commands;Awareness                   Current Attention Level: Focused Memory: Decreased recall of precautions;Decreased short-term memory Following Commands: Follows one step commands inconsistently;Follows one step commands with increased time   Awareness: Intellectual Problem Solving: Slow processing;Decreased initiation;Difficulty sequencing;Requires verbal cues;Requires tactile cues General Comments: pt eating with L Ue and not R UE. pt states "i cant move it " pt can move  R UE but it does appear much weaker than L UE   General Comments  86% HFNC 15 L- decr arousal     Exercises     Shoulder Instructions      Home Living Family/patient expects to be discharged to:: Skilled nursing facility                                 Additional Comments: pt lives alone      Prior Functioning/Environment Level of Independence: Independent with assistive device(s)        Comments: Uses prosthesis for mobility        OT Problem List: Decreased strength;Decreased activity tolerance;Impaired balance (sitting and/or standing);Decreased cognition;Decreased safety awareness;Decreased knowledge of use of DME or AE;Decreased knowledge of precautions      OT Treatment/Interventions: Self-care/ADL training;Therapeutic exercise;Neuromuscular education;Energy conservation;DME and/or AE instruction;Manual therapy;Modalities;Therapeutic activities;Cognitive remediation/compensation;Patient/family education;Balance training    OT Goals(Current goals can be found in the care plan section) Acute Rehab OT Goals Patient Stated Goal: wants to know if he had a stroke OT Goal Formulation: Patient unable to participate in goal setting Time For Goal Achievement: 01/21/19 Potential to Achieve Goals: Good  OT Frequency: Min 2X/week   Barriers to D/C:            Co-evaluation              AM-PAC OT "6 Clicks" Daily Activity     Outcome Measure Help from another person eating meals?: A Lot Help from another person taking care of personal grooming?: A Lot Help from another person toileting, which includes using toliet, bedpan, or urinal?: Total Help from another person bathing (including washing, rinsing, drying)?: Total Help from another person to put on and taking off regular upper body clothing?: Total Help from another person to put on and taking off regular lower body clothing?: Total 6 Click Score: 8   End of Session Nurse Communication: Mobility  status;Precautions  Activity Tolerance: Patient limited by lethargy Patient left: in bed;with call bell/phone within reach;with bed alarm set  OT Visit Diagnosis: Unsteadiness on feet (R26.81);Muscle weakness (generalized) (M62.81)                Time: [id number] OT Time Calculation (min): 39 min Charges:  OT General Charges $OT Visit: 1 Visit OT Evaluation $OT Eval Moderate Complexity: 1 Mod OT Treatments $Self Care/Home Management : 8-22 mins   Jeri Modena, OTR/L  Acute Rehabilitation Services Pager: [phone] Office: [phone] .   Jeri Modena 01/07/2019, 3:40 PM

## 2019-01-07 NOTE — Care Management Important Message (Signed)
Important Message  Patient Details  Name: Nathan [name] MRN: [id number] Date of Birth: [birth year]   Medicare Important Message Given:  Yes     Orbie Pyo 01/07/2019, 12:36 PM

## 2019-01-08 LAB — CBC
HCT: 26.7 % — ABNORMAL LOW (ref 39.0–52.0)
Hemoglobin: 9 g/dL — ABNORMAL LOW (ref 13.0–17.0)
MCH: 39.3 pg — ABNORMAL HIGH (ref 26.0–34.0)
MCHC: 33.7 g/dL (ref 30.0–36.0)
MCV: 116.6 fL — ABNORMAL HIGH (ref 80.0–100.0)
Platelets: 263 10*3/uL (ref 150–400)
RBC: 2.29 MIL/uL — ABNORMAL LOW (ref 4.22–5.81)
RDW: 14 % (ref 11.5–15.5)
WBC: 3.8 10*3/uL — ABNORMAL LOW (ref 4.0–10.5)
nRBC: 0 % (ref 0.0–0.2)

## 2019-01-08 LAB — GLUCOSE, CAPILLARY
Glucose-Capillary: 231 mg/dL — ABNORMAL HIGH (ref 70–99)
Glucose-Capillary: 206 mg/dL — ABNORMAL HIGH (ref 70–99)
Glucose-Capillary: 189 mg/dL — ABNORMAL HIGH (ref 70–99)
Glucose-Capillary: 170 mg/dL — ABNORMAL HIGH (ref 70–99)

## 2019-01-08 LAB — FERRITIN: Ferritin: 502 ng/mL — ABNORMAL HIGH (ref 24–336)

## 2019-01-08 MED ORDER — INSULIN GLARGINE 100 UNIT/ML ~~LOC~~ SOLN: 27.0000 [IU] | Freq: Every day | SUBCUTANEOUS | Status: DC

## 2019-01-08 MED ORDER — INSULIN GLARGINE 100 UNIT/ML ~~LOC~~ SOLN
20.0000 [IU] | Freq: Every day | SUBCUTANEOUS | Status: DC
  Administered 2019-01-08: 20 [IU] via SUBCUTANEOUS
  Filled 2019-01-08 (×2): qty 0.2

## 2019-01-08 NOTE — Progress Notes (Signed)
Physical Therapy Treatment Patient Details Name: Nathan [name] MRN: [id number] DOB: 1950/10/26 Today's Date: 01/08/2019    History of Present Illness Pt fell at home and was on floor x 4 days without food or water. Pt found to be septic with PNA and have rhabdomyolosis. Pt has required bipap and HFNC. PMH - rt AKA, HTN, DM, PVD, MS, HIV    PT Comments    Patient progressing with able to tolerate OOB to chair via lift.  Still rather slumped and positioned with pillows due to reports limited "meat on R butt cheek".  Still at 88-89% on high flow O2 @ 15L.  Feel he will likely need SNF level rehab at d/c.     Follow Up Recommendations  SNF;Supervision/Assistance - 24 hour     Equipment Recommendations  Other (comment)(TBA)    Recommendations for Other Services       Precautions / Restrictions Precautions Precautions: Fall Precaution Comments: watch SpO2, R AKA    Mobility  Bed Mobility Overal bed mobility: Needs Assistance Bed Mobility: Rolling Rolling: Mod assist;+2 for safety/equipment         General bed mobility comments: rolling for placing lift pad, used rail and L LE  Transfers Overall transfer level: Needs assistance   Transfers: (bed to chair)           General transfer comment: OOB to chair via lift with assist for positioning once in chair leaning back and to L  Ambulation/Gait                 Stairs             Wheelchair Mobility    Modified Rankin (Stroke Patients Only)       Balance                                            Cognition Arousal/Alertness: Awake/alert Behavior During Therapy: WFL for tasks assessed/performed Overall Cognitive Status: Impaired/Different from baseline                     Current Attention Level: Sustained   Following Commands: Follows one step commands consistently     Problem Solving: Slow processing;Decreased initiation;Requires verbal cues General Comments: in  bed reports thinking he was near the end, but agreeable to get up to chair and once in chair brightened up and able to eat      Exercises General Exercises - Lower Extremity Heel Slides: AROM;10 reps;Left;Supine Straight Leg Raises: AAROM;10 reps;Right;Supine    General Comments        Pertinent Vitals/Pain Pain Assessment: Faces Faces Pain Scale: Hurts a little bit Pain Location: R buttocks in sitting Pain Descriptors / Indicators: Discomfort Pain Intervention(s): Monitored during session;Repositioned    Home Living                      Prior Function            PT Goals (current goals can now be found in the care plan section) Progress towards PT goals: Progressing toward goals    Frequency    Min 3X/week      PT Plan Current plan remains appropriate    Co-evaluation              AM-PAC PT "6 Clicks" Mobility   Outcome Measure  Help needed turning  from your back to your side while in a flat bed without using bedrails?: A Lot Help needed moving from lying on your back to sitting on the side of a flat bed without using bedrails?: Total Help needed moving to and from a bed to a chair (including a wheelchair)?: Total Help needed standing up from a chair using your arms (e.g., wheelchair or bedside chair)?: Total Help needed to walk in hospital room?: Total Help needed climbing 3-5 steps with a railing? : Total 6 Click Score: 7    End of Session Equipment Utilized During Treatment: Oxygen;Other (comment)(hi flow O2 & maximove lift) Activity Tolerance: Patient tolerated treatment well Patient left: in chair;with call bell/phone within reach;with chair alarm set Nurse Communication: Mobility status;Need for lift equipment PT Visit Diagnosis: Other abnormalities of gait and mobility (R26.89);History of falling (Z91.81);Muscle weakness (generalized) (M62.81)     Time: [id number] PT Time Calculation (min) (ACUTE ONLY): 35 min  Charges:   $Therapeutic Activity: 23-37 mins                     Magda Kiel, [place] Acute Rehabilitation Services [phone] 01/08/2019    Reginia Naas 01/08/2019, 5:08 PM

## 2019-01-08 NOTE — Plan of Care (Signed)

## 2019-01-08 NOTE — Progress Notes (Signed)
   Subjective: Pt seen at the bedside on rounds this AM.  Patient appears much more comfortable and more active today.  Used incentive spirometer and flutter valve in front of [place].  Patient was encouraged to work with the therapist and spent some time in the bedside chair today.  Objective:  Vital signs in last 24 hours: Vitals:   01/07/19 1909 01/07/19 2018 01/07/19 2300 01/08/19 0316  BP: (!) 120/46   (!) 142/58  Pulse:      Resp:      Temp: 98.1 F (36.7 C)  98.8 F (37.1 C) 98.5 F (36.9 C)  TempSrc: Oral  Oral Oral  SpO2:  92%    Weight:      Height:       Physical Exam HEENT: NCAT, nasal cannula in place Cardiovascular: Normal S1 and S2 appreciated, no murmurs rubs or gallops Pulm: Diminished breath sounds at bilateral bases, crackles appreciated on the LLL field Neuro: A&Ox4, No focal deficits appreciated  Assessment/Plan:  Principal Problem:   Acute respiratory failure with hypoxia (HCC) Active Problems:   Human immunodeficiency virus I infection ([place])   DM type 2 with diabetic peripheral neuropathy ([place])   Community acquired pneumonia   Macrocytic anemia   Non-traumatic rhabdomyolysis   Sepsis with acute liver failure without hepatic coma or septic shock ([place])  In summary, Mr. Heber Elgin is a 68 year old gentleman with a history of HIV,T2DM, MS, microcytic anemia who presented after being found down at his home for several days. Work-up was notable for left lower lobe pneumonia and sepsis and rhabdomyolysis. He has required BiPap for most of his stay, but he is now on HFNC 15L/ min with saturations in the high 80s, low 90s.  #LLL pneumonia #Hypoxia: S/p 7 day course of ceftriaxone + azithro.The patient has remained afebrile, however the patient continues to require O2 supplementation. Continues to be on HFNC 15 L/min. O2 saturations seem to be in the upper 80s and low 90s. Patient has struggled to participate in physical therapy due to low oxygen saturation. He has had  low motivation to get out of bed, but today he says he will use his spirometer and flutter valve. Also will try to spend time in the bedside chair. - Encouraged pt to use incentive spiro, pulmonary toilet, & flutter valve. Also encouraged to get out of bed and work with the therapists. Pt says he will do that today.  - Goal to wean to 4L of O2 prior to discharge. Does not use oxygen at home normally.   HIV: -Well controlled. Last CD4 count 632, viral load undetectable since 2013. - ContinuehomeBiktarvy and Zidovudine.  HTN - Lisinopril 40 mg QHS  T2DM: Home regimen is Lantus 70U qhs and humalog 22 u TID - Lantus to 10 units BID with SSI  Rhabdomyolysis:Resolved  Dispo: Anticipated discharge pending O2 requirement   .Earlene Plater, MD Internal Medicine, PGY1 Pager: [phone]  01/08/2019,10:48 AM

## 2019-01-08 NOTE — Progress Notes (Signed)
Spoke with Provider Mitzi Hansen about patient care. Patient remains on 15L HFNC unable to wean down. O2 demand has not changed. O2 remains in 87%-92% MD aware.  Patient reports no SOB or distress. Patient currently resting in chair next to bed with chair alarm on. Patient encouraged to use flutter valve and incentive spirometer. Will continue to monitor and assess patient.

## 2019-01-09 ENCOUNTER — Inpatient Hospital Stay (HOSPITAL_COMMUNITY): Payer: Medicare Other

## 2019-01-09 LAB — RETICULOCYTES
Immature Retic Fract: 12 % (ref 2.3–15.9)
RBC.: 2.36 MIL/uL — ABNORMAL LOW (ref 4.22–5.81)
Retic Count, Absolute: 50.3 10*3/uL (ref 19.0–186.0)
Retic Ct Pct: 2.1 % (ref 0.4–3.1)

## 2019-01-09 LAB — GLUCOSE, CAPILLARY
Glucose-Capillary: 181 mg/dL — ABNORMAL HIGH (ref 70–99)
Glucose-Capillary: 268 mg/dL — ABNORMAL HIGH (ref 70–99)
Glucose-Capillary: 236 mg/dL — ABNORMAL HIGH (ref 70–99)
Glucose-Capillary: 244 mg/dL — ABNORMAL HIGH (ref 70–99)

## 2019-01-09 LAB — CBC
HCT: 27.4 % — ABNORMAL LOW (ref 39.0–52.0)
Hemoglobin: 9.3 g/dL — ABNORMAL LOW (ref 13.0–17.0)
MCH: 39.4 pg — ABNORMAL HIGH (ref 26.0–34.0)
MCHC: 33.9 g/dL (ref 30.0–36.0)
MCV: 116.1 fL — ABNORMAL HIGH (ref 80.0–100.0)
Platelets: 274 10*3/uL (ref 150–400)
RBC: 2.36 MIL/uL — ABNORMAL LOW (ref 4.22–5.81)
RDW: 14 % (ref 11.5–15.5)
WBC: 4.2 10*3/uL (ref 4.0–10.5)
nRBC: 0 % (ref 0.0–0.2)

## 2019-01-09 LAB — IRON AND TIBC
Iron: 47 ug/dL (ref 45–182)
Saturation Ratios: 22 % (ref 17.9–39.5)
TIBC: 217 ug/dL — ABNORMAL LOW (ref 250–450)
UIBC: 170 ug/dL

## 2019-01-09 MED ORDER — ALBUTEROL SULFATE (2.5 MG/3ML) 0.083% IN NEBU: 2.5000 mg | INHALATION_SOLUTION | Freq: Four times a day (QID) | RESPIRATORY_TRACT | Status: DC

## 2019-01-09 MED ORDER — SODIUM CHLORIDE 0.9 % IV SOLN
510.0000 mg | Freq: Once | INTRAVENOUS | Status: DC
  Filled 2019-01-09: qty 17

## 2019-01-09 MED ORDER — ALBUTEROL SULFATE (2.5 MG/3ML) 0.083% IN NEBU: 2.5000 mg | INHALATION_SOLUTION | Freq: Four times a day (QID) | RESPIRATORY_TRACT | Status: DC | PRN

## 2019-01-09 MED ORDER — INSULIN GLARGINE 100 UNIT/ML ~~LOC~~ SOLN
30.0000 [IU] | Freq: Every day | SUBCUTANEOUS | Status: DC
  Administered 2019-01-09 – 2019-01-13 (×5): 30 [IU] via SUBCUTANEOUS
  Filled 2019-01-09 (×6): qty 0.3

## 2019-01-09 MED ORDER — ALBUTEROL SULFATE HFA 108 (90 BASE) MCG/ACT IN AERS: 2.0000 | INHALATION_SPRAY | Freq: Four times a day (QID) | RESPIRATORY_TRACT | Status: DC

## 2019-01-09 NOTE — Progress Notes (Signed)
   Subjective: Pt seen at the bedside on rounds this AM.  Patient says he spent some time in his bedside chair yesterday and it was hard for him to stay in it.  He says he feels very tired today.  Team encouraged him to continue sitting in the chair and being active with his incentive spirometer and flutter valve.  Endorses understanding and says he will continue to be active.  Objective:  Vital signs in last 24 hours: Vitals:   01/08/19 1921 01/08/19 2015 01/08/19 2258 01/09/19 0358  BP: (!) 121/54  (!) 115/54 (!) 126/55  Pulse:      Resp:      Temp: 98.6 F (37 C)  98.7 F (37.1 C) 98.5 F (36.9 C)  TempSrc: Oral  Oral Oral  SpO2:  91%    Weight:      Height:       Physical Exam: General: Ill-appearing, laying in bed HEENT: NCAT CV: Normal S1-S2, no murmurs rubs or gallops appreciated PULM: Diminished lung sounds at the bases bilaterally. MSK: RLE well healed AKA NEURO: Alert and oriented.  No focal deficits appreciated  Assessment/Plan:  Principal Problem:   Acute respiratory failure with hypoxia (HCC) Active Problems:   Human immunodeficiency virus I infection ([place])   DM type 2 with diabetic peripheral neuropathy ([place])   Community acquired pneumonia   Macrocytic anemia   Non-traumatic rhabdomyolysis   Sepsis with acute liver failure without hepatic coma or septic shock ([place])  In summary, Mr. Heber  is a 68 year old gentleman with a history of HIV,T2DM, MS, microcytic anemia who presented after being found down at his home for several days. Work-up was notable for left lower lobe pneumonia and sepsis and rhabdomyolysis. He has required BiPap for most of his stay, but he has been weaned to HFNC 15L/ min with saturations in the low 90s.   #LLL pneumonia #Hypoxia: S/p 7 day course of ceftriaxone + azithro.The patient has remained afebrile, but still requires O2 supplementation HFNC 15L/min.O2 saturations 93% today.Patient has struggled to participate in physical therapy  due to low oxygen saturation. Pt got out of bed and spent several hours in the bedside chair. PT note says he had poor posture, but this is to be expected given how deconditioned he is.  - Continue using incentive spiro, pulmonary toilet, &flutter valve. Also encouraged to continue getting out of bed and working with the therapists.  - Goal to wean to 4L of O2 prior to discharge. Does not use O2 at home.  #HIV -Well controlled. Last CD4 count 632, viral load undetectablesince 2013. - ContinuehomeBiktarvy and Zidovudine.  #HTN -Continue Lisinopril 40 mg daily  #T2DM:Home regimen is Lantus 70U qhs and humalog 22U TID. Blood sugars have ran in the mid 200s during the day over the last few days requiring up to 20U of SSI.  - Increased Lantus 30U nightly + SSI - Will continue to monitor   #Rhabdomyolysis:Resolved  Dispo: Anticipated discharge pending O2 requirement   Earlene Plater, MD Internal Medicine, PGY1 Pager: [phone]  01/09/2019,11:43 AM

## 2019-01-09 NOTE — Plan of Care (Signed)
  Problem: Nutrition: Goal: Adequate nutrition will be maintained Outcome: Progressing   Problem: Coping: Goal: Level of anxiety will decrease Outcome: Progressing   Problem: Pain Managment: Goal: General experience of comfort will improve Outcome: Progressing   Problem: Education: Goal: Knowledge of General Education information will improve Description: Including pain rating scale, medication(s)/side effects and non-pharmacologic comfort measures Outcome: Not Progressing   Problem: Clinical Measurements: Goal: Respiratory complications will improve Outcome: Not Progressing

## 2019-01-09 NOTE — TOC Initial Note (Signed)
Transition of Care [facility]) - Initial/Assessment Note    Patient Details  Name: Nathan [name] MRN: [id number] Date of Birth: December 05, 1950  Transition of Care [facility]) CM/SW Contact:    Geralynn Ochs, LCSW Phone Number: 01/09/2019, 4:27 PM  Clinical Narrative:  CSW spoke with patient to discuss rehab. Patient hard of hearing but said he could understand if CSW got close and spoke very loudly. CSW explained the need for SNF, and patient agreed; said he had been down at home for a long time and he doesn't want to go through that again, he wants to be able to get around and take care of himself. Patient provided permission for CSW to fax out referral and CSW will follow up with CMS list tomorrow.                 Expected Discharge Plan: Skilled Nursing Facility Barriers to Discharge: Continued Medical Work up   Patient Goals and CMS Choice Patient states their goals for this hospitalization and ongoing recovery are:: to get stronger to go home      Expected Discharge Plan and Services Expected Discharge Plan: [place] Choice: [employer] arrangements for the past 2 months: Single Family Home                                      Prior Living Arrangements/Services Living arrangements for the past 2 months: Single Family Home Lives with:: Self Patient language and need for interpreter reviewed:: No Do you feel safe going back to the place where you live?: Yes      Need for Family Participation in Patient Care: No (Comment) Care giver support system in place?: No (comment)   Criminal Activity/Legal Involvement Pertinent to Current Situation/Hospitalization: No - Comment as needed  Activities of Daily Living      Permission Sought/Granted Permission sought to share information with : Chartered certified accountant granted to share information with : Yes, Verbal Permission Granted     Permission granted  to share info w AGENCY: SNF        Emotional Assessment Appearance:: Appears stated age Attitude/Demeanor/Rapport: Engaged Affect (typically observed): Appropriate Orientation: : Oriented to Self, Oriented to Place, Oriented to  Time, Oriented to Situation Alcohol / Substance Use: Not Applicable Psych Involvement: No (comment)  Admission diagnosis:  Non-traumatic rhabdomyolysis [M62.82] Community acquired pneumonia of left lung, unspecified part of lung [J18.9] Sepsis with acute liver failure without hepatic coma or septic shock, due to unspecified organism ([place]) [A41.9, R65.20, K72.00] Patient Active Problem List   Diagnosis Date Noted  . Non-traumatic rhabdomyolysis   . Sepsis with acute liver failure without hepatic coma or septic shock ([place])   . Macrocytic anemia 01/01/2019  . Acute respiratory failure with hypoxia ([place]) 12/30/2018  . Fall at home 12/29/2018  . Community acquired pneumonia 12/29/2018  . Cellulitis/Abscess of Right Anterior Chest 12/04/2018  . History of syphilis 10/09/2013  . Atherosclerosis of native artery of extremity with intermittent claudication ([place]) 04/08/2012  . Occlusion and stenosis of carotid artery without mention of cerebral infarction 12/11/2011  . Phantom limb syndrome (right lower limb) 11/23/2011  . Health care maintenance 11/23/2011  . Status post above knee amputation ([place]) 08/02/2009  . DM type 2 with diabetic peripheral neuropathy ([place]) 04/05/2009  . Benign essential HTN 02/13/2008  . Hyperlipidemia associated with type  2 diabetes mellitus ([place]) 07/29/2007  . Human immunodeficiency virus I infection ([place]) 02/12/2006  . COLONIC POLYPS, ADENOMATOUS 02/12/2006  . Multiple sclerosis ([place]) 02/12/2006  . PERIPHERAL VASCULAR DISEASE 02/12/2006  . GERD 02/12/2006  . Hepatitis B core antibody positive 02/12/2006   PCP:  Welford Roche, MD Pharmacy:   [facility] [place], [address] [place] [address] Phone: [phone] Fax: [phone]  [place], [place] [id number] [place] [id number] [place] [address] Phone: [phone] Fax: [phone]     Social Determinants of Health (SDOH) Interventions    Readmission Risk Interventions No flowsheet data found.

## 2019-01-09 NOTE — Progress Notes (Signed)
1500: Patient transferred to chair at bedside with lift. Patient encouraged to use incentive spirometer and flutter valve. Patient able to demonstrate the use of both. He reports feeling better in the chair.   [place] with MD Sheppard Coil about a 2 view xray could not be done in the room. Patient will have to be transported vie bed. He confirmed a two view was needed and gave the  OK to transport patient for xray.

## 2019-01-10 ENCOUNTER — Inpatient Hospital Stay (HOSPITAL_COMMUNITY): Payer: Medicare Other

## 2019-01-10 DIAGNOSIS — J90 Pleural effusion, not elsewhere classified: Secondary | ICD-10-CM

## 2019-01-10 LAB — GLUCOSE, CAPILLARY
Glucose-Capillary: 181 mg/dL — ABNORMAL HIGH (ref 70–99)
Glucose-Capillary: 157 mg/dL — ABNORMAL HIGH (ref 70–99)
Glucose-Capillary: 181 mg/dL — ABNORMAL HIGH (ref 70–99)
Glucose-Capillary: 237 mg/dL — ABNORMAL HIGH (ref 70–99)

## 2019-01-10 LAB — CBC
HCT: 26.4 % — ABNORMAL LOW (ref 39.0–52.0)
Hemoglobin: 9 g/dL — ABNORMAL LOW (ref 13.0–17.0)
MCH: 39.3 pg — ABNORMAL HIGH (ref 26.0–34.0)
MCHC: 34.1 g/dL (ref 30.0–36.0)
MCV: 115.3 fL — ABNORMAL HIGH (ref 80.0–100.0)
Platelets: 288 10*3/uL (ref 150–400)
RBC: 2.29 MIL/uL — ABNORMAL LOW (ref 4.22–5.81)
RDW: 14.1 % (ref 11.5–15.5)
WBC: 4.7 10*3/uL (ref 4.0–10.5)
nRBC: 0 % (ref 0.0–0.2)

## 2019-01-10 LAB — BODY FLUID CELL COUNT WITH DIFFERENTIAL
Eos, Fluid: 0 %
Lymphs, Fluid: 92 %
Monocyte-Macrophage-Serous Fluid: 7 % — ABNORMAL LOW (ref 50–90)
Neutrophil Count, Fluid: 1 % (ref 0–25)
Total Nucleated Cell Count, Fluid: 815 cu mm (ref 0–1000)

## 2019-01-10 LAB — PROTEIN, PLEURAL OR PERITONEAL FLUID: Total protein, fluid: 3.3 g/dL

## 2019-01-10 LAB — LACTATE DEHYDROGENASE, PLEURAL OR PERITONEAL FLUID: LD, Fluid: 185 U/L — ABNORMAL HIGH (ref 3–23)

## 2019-01-10 NOTE — Procedures (Signed)
Thoracentesis Procedure Note  Pre-operative Diagnosis:  L pleural effusion   Indications:  Shortness of breath, increased oxygen requirement  Procedure Details:  Informed consent was obtained after explanation of the risks and benefits of the procedure, refer to the consent documentation.  Time-out Performed immediately prior to the procedure.  All available chest radiographs were reviewed and the patient was subsequently placed in a sitting/semi-recumbent position.  Using ultrasound guidance a moderate pleural effusion was noted on the left.  The area was prepped with chlorhexadine and draped in sterile fashion. Following this 1% lidocaine was injected subcutaneously and deep to provide anesthesia.  A small incision was then made parallel and superior to the rib, the thoracentesis needle with catheter was inserted into the chest wall and advanced under constant aspiration. Upon aspiration of pleural fluid, the catheter was advanced into the pleural space and the needle was removed. The catheter was then connected to a drainange bag and the fluid was removed under manual drainage. The catheter was then removed during slow forced exhalation and a sterile bandage was placed.  Findings:   344mL of serous, non-bloody pleural fluid was removed.  Fluid was sent for cell count with differential, LDH, and protein   Condition: The patient tolerated the procedure well and remains in the same condition as pre-procedure.  Complications: No complications during procedure.   Plan: Post-procedure chest X-ray has been ordered. Will follow up closely.

## 2019-01-10 NOTE — Progress Notes (Signed)
Patient offered to be moved to the chair from bed and declined. Attempted to place the bed in chair mode and the patient stated it was uncomfortable and requested to be placed back in a laying position.

## 2019-01-10 NOTE — Progress Notes (Signed)
Subjective: Patient seen at the bedside on morning rounds today.  Patient's brother was present as well.  [name] says he feels about the same.  He was able to spend some time in the bedside chair.  He said it was a struggle, but it was better than it was the day before.  No other complaints at this time. Pt says it is okay to update his brother on his progress.  Objective:  Vital signs in last 24 hours: Vitals:   01/10/19 0804 01/10/19 0817 01/10/19 0900 01/10/19 1000  BP: (!) 115/56  (!) 104/57 (!) 91/57  Pulse: 78  87 92  Resp: 17  15 12   Temp: 98.7 F (37.1 C)     TempSrc: Oral     SpO2: (!) 89% 92% 91% 91%  Weight:      Height:       CXR: IMPRESSION: Persistent pneumonia involving the left lower and left upper lobes with a small to moderate-sized left-sided parapneumonic effusion.  Worsening opacity at the medial right lung base may represent an additional infiltrate.  Bedside ultrasound - L sided PLAPS point obtained and showed moderate sized pleural effusion   Physical Exam: General: Ill appearing male resting in bed HEENT: Nasal cannula in place, NCAT CV: RRR, normal S1 & S2 appreciated, no murmurs rubs or gallops PULM: Diminished breath sounds bilaterally L more affected than R MSK: R sided well healed AKA Neuro: No focal deficits present   Assessment/Plan:  Principal Problem:   Acute respiratory failure with hypoxia (HCC) Active Problems:   Human immunodeficiency virus I infection ([place])   DM type 2 with diabetic peripheral neuropathy ([place])   Community acquired pneumonia   Macrocytic anemia   Non-traumatic rhabdomyolysis   Sepsis with acute liver failure without hepatic coma or septic shock ([employer])  In summary, [name] is a 68 year old gentleman with a history of HIV,T2DM, MS, microcytic anemia who presented after being found down at his home for several days. Work-up was notable for left lower lobe pneumonia and sepsis and rhabdomyolysis. He has  required BiPap for most of his stay, but he has been weaned to HFNC 12L/ min with saturations in the low 90s.  #LLL pneumonia #Hypoxia: S/p 7 day course of ceftriaxone + azithro.The patient has remained afebrile, but still requires O2 supplementation HFNC 12L/min.O2 saturations 94% today.Repeat CXR performed yesterday and bedside u/s was performed on rounds this AM were significant for LLL pleural effusion.  Given the slow and minimal improvement of the patient's respiratory status, I suspect removing some of the fluid via thoracentesis may give the patient better oxygenation. - Perform thoracocentesis this afternoon. The risks and benefits were discussed with the patient.  He endorses understanding and desires to go ahead with the procedure. - Continue using incentive spiro, pulmonary toilet, &flutter valve. Also encouraged to continue getting out of bed and working with the therapists. - Goal to wean to 4L of O2 prior to discharge. Does not use O2 at home.  #HIV -Well controlled. Last CD4 count 632, viral load undetectablesince 2013. - ContinuehomeBiktarvy and Zidovudine.  #HTN -Continue Lisinopril 40 mg daily  #T2DM:Home regimen is Lantus 70U qhs and humalog 22U TID. Blood sugars have ran in the mid 200s during the day over the last few days requiring up to 20U of SSI.  - Lantus 30U nightly + SSI - Will continue to monitor   #Rhabdomyolysis:Resolved  Dispo: Anticipated discharge pendingO2 requirement.   Earlene Plater, MD Internal Medicine, PGY1 Pager:  [phone]  01/10/2019,11:14 AM

## 2019-01-10 NOTE — TOC Progression Note (Signed)
Transition of Care [facility]) - Progression Note    Patient Details  Name: Nathan [name] MRN: [id number] Date of Birth: 1950/05/22  Transition of Care [facility]) CM/SW [place], [place] Phone Number: 01/10/2019, 3:47 PM  Clinical Narrative:  CSW met with patient and his brother at bedside, patient provided permission to discuss with his brother. CSW provided CMS list to patient and brother, explained to brother the recommendation for SNF placement. Brother in agreement. Brother and patient to research, and will touch base with CSW later in the day with any other questions.  Brother contacted patient this afternoon to say that the family just wants the patient to be as close to the hospital as possible, wherever that is. The main priority for the family is that the patient is close to the hospital in case anything happens. CSW reached out to [facility], and they have no bed availability; although [place] would have a bed available for the patient. CSW reached out to [facility], and they are reviewing referral. CSW to follow.    Expected Discharge Plan: [place] Barriers to Discharge: Continued Medical Work up  Expected Discharge Plan and Services Expected Discharge Plan: [place] Choice: [place] arrangements for the past 2 months: Single Family Home                                       Social Determinants of Health (SDOH) Interventions    Readmission Risk Interventions No flowsheet data found.

## 2019-01-11 LAB — BASIC METABOLIC PANEL
Anion gap: 6 (ref 5–15)
BUN: 13 mg/dL (ref 8–23)
CO2: 28 mmol/L (ref 22–32)
Calcium: 8.4 mg/dL — ABNORMAL LOW (ref 8.9–10.3)
Chloride: 100 mmol/L (ref 98–111)
Creatinine, Ser: 0.53 mg/dL — ABNORMAL LOW (ref 0.61–1.24)
GFR calc Af Amer: >60 mL/min (ref >60)
GFR calc non Af Amer: >60 mL/min (ref >60)
Glucose, Bld: 193 mg/dL — ABNORMAL HIGH (ref 70–99)
Potassium: 3.9 mmol/L (ref 3.5–5.1)
Sodium: 134 mmol/L — ABNORMAL LOW (ref 135–145)

## 2019-01-11 LAB — CBC
HCT: 25.9 % — ABNORMAL LOW (ref 39.0–52.0)
Hemoglobin: 8.9 g/dL — ABNORMAL LOW (ref 13.0–17.0)
MCH: 39.7 pg — ABNORMAL HIGH (ref 26.0–34.0)
MCHC: 34.4 g/dL (ref 30.0–36.0)
MCV: 115.6 fL — ABNORMAL HIGH (ref 80.0–100.0)
Platelets: 282 10*3/uL (ref 150–400)
RBC: 2.24 MIL/uL — ABNORMAL LOW (ref 4.22–5.81)
RDW: 14.1 % (ref 11.5–15.5)
WBC: 5 10*3/uL (ref 4.0–10.5)
nRBC: 0 % (ref 0.0–0.2)

## 2019-01-11 LAB — GLUCOSE, CAPILLARY
Glucose-Capillary: 202 mg/dL — ABNORMAL HIGH (ref 70–99)
Glucose-Capillary: 199 mg/dL — ABNORMAL HIGH (ref 70–99)
Glucose-Capillary: 273 mg/dL — ABNORMAL HIGH (ref 70–99)
Glucose-Capillary: 170 mg/dL — ABNORMAL HIGH (ref 70–99)

## 2019-01-11 LAB — LACTATE DEHYDROGENASE: LDH: 188 U/L (ref 98–192)

## 2019-01-11 LAB — PROTEIN, TOTAL: Total Protein: 5.8 g/dL — ABNORMAL LOW (ref 6.5–8.1)

## 2019-01-11 NOTE — Progress Notes (Signed)
   Subjective: Pt seen at the bedside this AM. Patient says he doesn't have any pain from the thoracentesis yesterday.  Patient says his breathing is about the same.  The team encouraged the patient to use his flutter valve and incentive spirometer throughout the day today.  We also encouraged him to sit up in the chair at the bedside.  Patient says he understands and will try his best.  Objective:  Vital signs in last 24 hours: Vitals:   01/10/19 2205 01/10/19 2300 01/10/19 2330 01/11/19 0300  BP: (!) 110/41 (!) 108/43 (!) 119/52 (!) 119/55  Pulse:   80 74  Resp: (!) 26 18 (!) 25 20  Temp:   98.8 F (37.1 C) 98 F (36.7 C)  TempSrc:   Oral Oral  SpO2: 93% 94% 93% 91%  Weight:      Height:       Physical Exam: General: Ill-appearing male resting in bed HEENT: NCAT, nasal cannula in place CV: RRR, normal S1 and S2 appreciated, no murmurs, rubs or gallops PULM: Mild crackling appreciated on the left lower lung field. MSK: RLE AKA NEURO: Alert and oriented x4, no focal deficits appreciated  Assessment/Plan:  Principal Problem:   Acute respiratory failure with hypoxia (HCC) Active Problems:   Human immunodeficiency virus I infection ([place])   DM type 2 with diabetic peripheral neuropathy ([place])   Community acquired pneumonia   Macrocytic anemia   Non-traumatic rhabdomyolysis   Sepsis with acute liver failure without hepatic coma or septic shock (HCC)   Pleural effusion  In summary, [name] is a 68 year old gentleman with a history of HIV,T2DM, MS, microcytic anemia who presented after being found down at his home for several days. Work-up was notable for left lower lobe pneumonia and sepsis and rhabdomyolysis. He has required BiPap for most of his stay, but hehas been weaned [place] 10L/ min with saturations in the low 90s.  He is status post L sided thoracentesis as of 9/4.  #LLL pneumonia #Hypoxia: S/p 7 day course of ceftriaxone + azithro.The patient has remained  afebrile,but still requiresO2 supplementation HFNC 10L/min.O2 saturationscontinue to be in the low 90s.Thoracocentesis was performed on 9/4. Patient tolerated the procedure well. Follow-up x-ray did not demonstrate residual fluid or free air suggesting pneumothorax. Protein pleural serum ratio was 0.56, LDH pleural serum ratio was 0.98 suggesting exudative pleural effusion. This was expected given the patient's prior pneumonia. - Continue usingincentive spiro, pulmonary toilet, &flutter valve. Also encouraged tocontinue getting out of bed and working withthe therapists. - Continue O2 wean, goal 4L of O2 prior to discharge. Does not useO2 at home.  #HIV -Well controlled. Last CD4 count 632, viral load undetectablesince 2013. - ContinuehomeBiktarvy and Zidovudine.  #HTN -ContinueLisinopril 40 mgdaily  #T2DM:Home regimen is Lantus 70U qhs and humalog 22UTID. Blood sugars have ran in the mid 200s during the day over the last few days requiring up to 20U of SSI. -Lantus 30U nightly +SSI - Will continue to monitorsugars  #Rhabdomyolysis:Resolved  Dispo: Anticipated discharge pendingO2 requirement. CM working on SNF placement  Earlene Plater, MD Internal Medicine, PGY1 Pager: [phone]  01/11/2019,10:14 AM

## 2019-01-12 LAB — BASIC METABOLIC PANEL
Anion gap: 7 (ref 5–15)
BUN: 10 mg/dL (ref 8–23)
CO2: 27 mmol/L (ref 22–32)
Calcium: 8.6 mg/dL — ABNORMAL LOW (ref 8.9–10.3)
Chloride: 103 mmol/L (ref 98–111)
Creatinine, Ser: 0.54 mg/dL — ABNORMAL LOW (ref 0.61–1.24)
GFR calc Af Amer: >60 mL/min (ref >60)
GFR calc non Af Amer: >60 mL/min (ref >60)
Glucose, Bld: 179 mg/dL — ABNORMAL HIGH (ref 70–99)
Potassium: 4 mmol/L (ref 3.5–5.1)
Sodium: 137 mmol/L (ref 135–145)

## 2019-01-12 LAB — GLUCOSE, CAPILLARY
Glucose-Capillary: 175 mg/dL — ABNORMAL HIGH (ref 70–99)
Glucose-Capillary: 232 mg/dL — ABNORMAL HIGH (ref 70–99)
Glucose-Capillary: 251 mg/dL — ABNORMAL HIGH (ref 70–99)
Glucose-Capillary: 285 mg/dL — ABNORMAL HIGH (ref 70–99)

## 2019-01-12 LAB — CBC
HCT: 26.3 % — ABNORMAL LOW (ref 39.0–52.0)
Hemoglobin: 8.7 g/dL — ABNORMAL LOW (ref 13.0–17.0)
MCH: 39 pg — ABNORMAL HIGH (ref 26.0–34.0)
MCHC: 33.1 g/dL (ref 30.0–36.0)
MCV: 117.9 fL — ABNORMAL HIGH (ref 80.0–100.0)
Platelets: 303 10*3/uL (ref 150–400)
RBC: 2.23 MIL/uL — ABNORMAL LOW (ref 4.22–5.81)
RDW: 14.3 % (ref 11.5–15.5)
WBC: 5.8 10*3/uL (ref 4.0–10.5)
nRBC: 0 % (ref 0.0–0.2)

## 2019-01-12 MED ORDER — INSULIN ASPART 100 UNIT/ML ~~LOC~~ SOLN
2.0000 [IU] | Freq: Three times a day (TID) | SUBCUTANEOUS | Status: DC
  Administered 2019-01-12 – 2019-01-14 (×7): 2 [IU] via SUBCUTANEOUS

## 2019-01-12 NOTE — Progress Notes (Signed)
   Subjective: The patient continues to question his discharge date. I have again reiterated that this is up to how much effort he is willing to put into rehab. He stated that he will agree to get out of bed today and will use the incentive spirometer and flutter valve.  Objective:  Vital signs in last 24 hours: Vitals:   01/11/19 2300 01/12/19 0300 01/12/19 0741 01/12/19 0753  BP: (!) 116/54 (!) 125/55 (!) 125/55   Pulse: 80 77 72   Resp: (!) 23 15 15    Temp: 99.1 F (37.3 C) 98.9 F (37.2 C) 97.7 F (36.5 C)   TempSrc: Oral Oral Oral   SpO2: 92% 90% 92% 90%  Weight:      Height:       General: A/O x4, in no acute distress, afebrile, nondiaphoretic HEENT: PEERL, EMO intact Cardio: RRR, no mrg's  Pulmonary: CTA bilaterally, no wheezing or crackles  Abdomen: Bowel sounds normal, soft, nontender  MSK: BLE nontender, nonedematous Psych: Appropriate affect, not depressed in appearance, engages well  Assessment/Plan:  Principal Problem:   Acute respiratory failure with hypoxia (HCC) Active Problems:   Human immunodeficiency virus I infection (HCC)   DM type 2 with diabetic peripheral neuropathy ([place])   Community acquired pneumonia   Macrocytic anemia   Non-traumatic rhabdomyolysis   Sepsis with acute liver failure without hepatic coma or septic shock (HCC)   Pleural effusion  The patient is a 68 year old gentleman with a pertinent past medical history of HIV, type 2 diabetes mellitus, MS, microcytic anemia who presented after being found down at home for several days.  He was found to have a left lower lobe pneumonia of undetermined etiology despite in evaluation.  His pneumonia progressed to sepsis and rhabdomyolysis.  He recovered from the sepsis and completed his antibiotic course with resolution of his rhabdomyolysis as well.  However, he has required continued use of BiPAP and/or high flow nasal cannula at up to 15 L of supplemental oxygen due to deconditioning and pulmonary  inflammation secondary to his pneumonia.  The patient continues to wean slowly from his supplemental oxygen and should be ready for discharge within the week.  A/P: Acute persistent hypoxia: Left lower lobe pneumonia: Patient completed 7-day course of ceftriaxone and azithromycin.  Remained afebrile but continues to require supplemental oxygen via high flow nasal cannula.  He is now weaned down to 6-8 L of high flow from 15L earlier in the week.  We are continually  encouraging the use of a his incentive spirometry, flutter valve, pulmonary toileting, and scheduled duo-nebs.  I have also highly encouraged him to continue getting out of bed and working with the therapist as this will incorporate additional alveoli and decrease atelectasis. -Goal is to continue to wean submental oxygen to 4 L for discharge.   Diabetes mellitus type 2: Historically patient requires large doses of long-acting and mealtime insulin at home.  However, he has required far less insulin during this admission.  I have slightly adjusted his mealtime insulin for more optimal glycemic control today. -Continue Lantus 30 units nightly - Continue SSI with addition of 2 units at mealtime  Dispo: Anticipated discharge in approximately 2-3 day(s).   Kathi Ludwig, MD 01/12/2019, 9:03 AM Pager: # [phone]

## 2019-01-12 NOTE — Plan of Care (Signed)
  Problem: Clinical Measurements: Goal: Will remain free from infection Outcome: Progressing Goal: Respiratory complications will improve Outcome: Progressing   Problem: Nutrition:poor intake needs encouragement for supplement Goal: Adequate nutrition will be maintained Outcome: Not Progressing

## 2019-01-13 LAB — GLUCOSE, CAPILLARY
Glucose-Capillary: 200 mg/dL — ABNORMAL HIGH (ref 70–99)
Glucose-Capillary: 249 mg/dL — ABNORMAL HIGH (ref 70–99)
Glucose-Capillary: 313 mg/dL — ABNORMAL HIGH (ref 70–99)
Glucose-Capillary: 325 mg/dL — ABNORMAL HIGH (ref 70–99)

## 2019-01-13 NOTE — Discharge Summary (Addendum)
Name: Nathan [name] MRN: [id number] DOB: 12/09/1950 68 y.o. PCP: Welford Roche, MD  Date of Admission: 12/29/2018  9:59 AM Date of Discharge: 01/15/2019 Attending Physician: Axel Filler, *  Discharge Diagnosis: 1. LLL Pneumonia c/b L Pleural Effusion & Hypoxia 2. Rhabdomyolysis 3. T2DM  Discharge Medications: Allergies as of 01/14/2019      Reactions   Sulfonamide Derivatives Hives      Medication List    STOP taking these medications   ibuprofen 200 MG tablet Commonly known as: ADVIL     TAKE these medications   atorvastatin 40 MG tablet Commonly known as: LIPITOR TAKE 1 TABLET(40 MG) BY MOUTH DAILY What changed:   how much to take  how to take this  when to take this  additional instructions   Bayer Contour Monitor w/Device Kit Use to check blood sugar as instructed up to 4 times a day   Bayer Microlet 2 Lancing Devic Misc Use to check blood sugar up to 3 ties a day   bictegravir-emtricitabine-tenofovir AF 50-200-25 MG Tabs tablet Commonly known as: Biktarvy Take 1 tablet by mouth daily.   clopidogrel 75 MG tablet Commonly known as: PLAVIX TAKE 1 TABLET BY MOUTH DAILY   Contour Next Test test strip Generic drug: glucose blood 1 each by Other route 4 (four) times daily. Use 4 times daily to check blood sugar. diag code E11.42.   HumaLOG KwikPen 100 UNIT/ML KwikPen Generic drug: insulin lispro Inject 0.12 mLs (12 Units total) into the skin 3 (three) times daily. What changed:   how much to take  Another medication with the same name was removed. Continue taking this medication, and follow the directions you see here.   Insulin Pen Needle 32G X 4 MM Misc Commonly known as: BD Pen Needle Nano U/F USE AS DIRECTED FOUR TIMES DAILY. Dx code  E11.42 What changed: when to take this   lisinopril 40 MG tablet Commonly known as: ZESTRIL TAKE 1 TABLET BY MOUTH DAILY   omeprazole 40 MG capsule Commonly known as: PRILOSEC TAKE ONE  CAPSULE BY MOUTH EVERY DAY   pioglitazone 30 MG tablet Commonly known as: ACTOS Take 1 tablet (30 mg total) by mouth daily.   Toujeo Max SoloStar 300 UNIT/ML Sopn Generic drug: Insulin Glargine (2 Unit Dial) Inject 35 Units into the skin at bedtime. What changed: how much to take   Victoza 18 MG/3ML Sopn Generic drug: liraglutide ADMINISTER 1.8 MG UNDER THE SKIN EVERY MORNING What changed:   how much to take  how to take this  when to take this  additional instructions   zidovudine 300 MG tablet Commonly known as: RETROVIR TAKE 1 TABLET(300 MG) BY MOUTH TWICE DAILY What changed:   how much to take  how to take this  when to take this  additional instructions      Disposition and follow-up:   Nathan [name] was discharged from [facility] in fair  condition.  At the hospital follow up visit please address:  1. LLL Pneumonia c/b L Pleural Effusion complicated by Hypoxia: Patient treated with a 7 day course of Ceftriaxone and Azithromycin (8/22-8/29) for LLL pneumonia and increased O2 requirement. Course was complicated by persistent hypoxia and pleural effusion. The pleural effusion was drained via thoracentesis. Patient required 3L of supplemental oxygen via nasal cannula at discharge due to persistent symptoms.  2. Rhabdomyolysis: CK was 5224 on admission and downtrended to 491 with IVF.  3. T2DM: In the hosptial pt  received Lantus 30U nightly, SSI with additional 2U Novolog at mealtime.  4.  Labs / imaging needed at time of follow-up: CBC, Consider CXR if the patient develops fever, cough, or increased O2 requirement.   5.  Pending labs/ test needing follow-up: none  Follow-up Appointments:   The patient will need follow-up with her primary care doctor, Dr. Melinda Crutch, once he leaves SNF.   Hospital Course by problem list: 1. LLL Pneumonia c/b L Pleural Effusion & Hypoxia: Patient was hospitalized for a total of 16 days. The patient  presented to the hospital via EMS after being found down in his apartment. He was febrile to 103.6, tachycardic, tachypneic, acidotic, and had AMS. Upon w/u, he was found to have a left lung infiltrate on CXR. Received 7 day course of Ceftriaxone and Azithromycin (8/22-8/29) for LLL pneumonia. His course was complicated by hypoxia requiring BiPap. Pt initially had difficulty weaning off BiPap. When he was transitioned to high flow nasal cannula supplemental oxygen, the patient desaturated into the mid 80s. The patient was able to be weaned down to 15L O2 but continued to desaturate when we tried to wean further. We repeated a CXR on 9/3 which showed a small to moderate sized L sided effusion. A successful thoracentesis was performed under ultrasound guidance on 9/4.  F/u CXR showed no free air or evidence a developing pneumothorax. Over the next few days, the patient's oxygen requirement decreased and was discharged on 3L supplemental O2. The patient does not require oxygen at baseline, so it is expected that this will improve as he gains his strength back.  - Protein pleural:serum ratio was 0.56, LDH pleural: serum ratio was 0.98 suggesting exudative pleural effusion, which was not surprising given recent pneumonia. - Pt did have a tiny nodular density right base noted on CXR on 8/23. May consider f/u CT Chest to further evaluate.   2. Rhabdomyolysis: Patient was initially found down in his home.  CK was elevated to 5224 on admission. IVF were given and the CK downtrended to 491 during the first 5 days of hospitalization.  3. T2DM: Home regimen is Lantus 70U nightly and Humalog 22U 3 times daily. Has required less insulin during his hospitalization. We gave him Lantus 30U nightly, SSI with additional 2U Novolog at mealtime with adequate control of sugars.  Discharge Vitals:   BP (!) 104/59 (BP Location: Right Arm)   Pulse 85   Temp 98.8 F (37.1 C) (Oral)   Resp 18   Ht '6\' 1"'$  (1.854 m)   Wt 69.9 kg    SpO2 95%   BMI 20.33 kg/m   Pertinent Labs, Studies, and Procedures:   CT Head (8/23): IMPRESSION: No acute intracranial abnormalities   CXR (8/24): IMPRESSION: Diffuse airspace disease in the left lung compatible with pneumonia.  Tiny nodular density right base. Consider follow-up chest CT without contrast to further evaluate.  Bedside Thoracentesis with Ultrasound Guidance (9/4): FINGDINGS:   322m of serous, non-bloody pleural fluid was removed.   Fluid was sent for cell count with differential, LDH, and protein   Condition:  The patient tolerated the procedure well and remains in the same  condition as pre-procedure.   Complications:  No complications during procedure.   CXR (9/4 s/p thoracentesis) IMPRESSION: Improved left lower lobe pneumonia or atelectasis is noted. Stable right basilar opacity is noted.  Discharge Instructions: Discharge Instructions    Call MD for:  difficulty breathing, headache or visual disturbances   Complete by: As directed  Call MD for:  persistant dizziness or light-headedness   Complete by: As directed    Call MD for:  temperature >100.4   Complete by: As directed    Diet - low sodium heart healthy   Complete by: As directed    Discharge instructions   Complete by: As directed    I have changed how much insulin you take daily on discharge to almost half of what you were taking at home. This is because you have required only 30U on long acting insulin nightly and only 9U of short acting with meals the last few days but with averages in the 250's. I have increased this to 35U of long acting at night and 12U of short acting with meals as well as resuming your other diabetes medications. Please make certain you follow up with your clinic doctor to adjust your insulin needs.   Increase activity slowly   Complete by: As directed       Signed: Earlene Plater, MD Internal Medicine, PGY1 Pager: [phone]  01/15/2019,11:41 AM

## 2019-01-13 NOTE — Progress Notes (Signed)
Received in bed somewhat sleepy but easily awaken. Patient refused dinner but was coaxed into drinking ensure. Cleaned and made comfortable repositioned for comfort. O2 increase to 5L to maintain Saturation >90.

## 2019-01-13 NOTE — Progress Notes (Signed)
Asleep most of shift, easily awaken, some confusion at times, easily reoriented. Patient verbalize no complaints, no sign of discomfort, repostioned as per routine.

## 2019-01-13 NOTE — TOC Progression Note (Signed)
Transition of Care [facility]) - Progression Note    Patient Details  Name: ROMOLO SILVAN MRN: [id number] Date of Birth: [birth year]  Transition of Care [facility]) CM/SW [place], [place] Phone Number: 01/13/2019, 11:31 AM  Clinical Narrative:   CSW contacted by MD about patient's decreased oxygen need, close to discharge. CSW reached out to [facility] to ask about bed availability; Helene Kelp will need an updated COVID test before discharge. CSW alerted MD. Helene Kelp will have a bed tomorrow for patient, after COVID test results.    Expected Discharge Plan: [place] Barriers to Discharge: Continued Medical Work up  Expected Discharge Plan and Services Expected Discharge Plan: [place] Choice: [place] arrangements for the past 2 months: Single Family Home                                       Social Determinants of Health (SDOH) Interventions    Readmission Risk Interventions No flowsheet data found.

## 2019-01-13 NOTE — Progress Notes (Signed)
Occupational Therapy Treatment Patient Details Name: Nathan [name] MRN: [id number] DOB: 06-25-1950 Today's Date: 01/13/2019    History of present illness Pt fell at home and was on floor x 4 days without food or water. Pt found to be septic with PNA and have rhabdomyolosis. Pt has required bipap and HFNC. PMH - rt AKA, HTN, DM, PVD, MS, HIV   OT comments  Treatment session with focus on increased participation in bed mobility and OOB tolerance.  Pt initially stating that he is "paralyzed" but with max multimodal cues pt able to participate in rolling at bed level with +2 for safety and encouragement.  Due to not having prosthesis and reports of "no meat" in Rt buttocks utilized maximove for OOB transfer.  Pt reports he can have someone bring his prosthesis in, then therapy can begin to focus on alternative transfer methods and self-care retraining at sit > stand level.  Pt will continue to benefit from OT acutely with focus on self-care retraining and functional mobility to prepare for d/c to next venue of care.  Follow Up Recommendations  SNF    Equipment Recommendations  Other (comment)(TBD at next venue)       Precautions / Restrictions Precautions Precautions: Fall Precaution Comments: watch SpO2, R AKA Restrictions Weight Bearing Restrictions: No       Mobility Bed Mobility Overal bed mobility: Needs Assistance Bed Mobility: Rolling Rolling: Mod assist;+2 for safety/equipment   Supine to sit: Mod assist;+2 for safety/equipment Sit to supine: Mod assist;+2 for safety/equipment      Transfers Overall transfer level: Needs assistance               General transfer comment: OOB to chair via lift with assist for positioning once in chair for improved postural control        ADL either performed or assessed with clinical judgement   ADL Overall ADL's : Needs assistance/impaired Eating/Feeding: Set up;Sitting   Grooming: Wash/dry hands;Wash/dry face;Set  up;Sitting   Upper Body Bathing: Moderate assistance;Bed level   Lower Body Bathing: Total assistance;Bed level           Toilet Transfer: Total assistance             General ADL Comments: Pt able to sit EOB with supervision for sitting balance, pt with reports of feeling that he may pass out, returned to supine.  Once returned to sitting EOB, pt with increased tolerance however continues to require constant cues to sustain attention to task.               Cognition Arousal/Alertness: Awake/alert Behavior During Therapy: WFL for tasks assessed/performed Overall Cognitive Status: Impaired/Different from baseline Area of Impairment: Attention;Following commands                   Current Attention Level: Sustained   Following Commands: Follows one step commands consistently     Problem Solving: Slow processing;Decreased initiation;Requires verbal cues General Comments: Pt initially stating that he was "paralyzed" and could not participate in bed mobility, however able to complete with mod assist and +2 for emotional support                   Pertinent Vitals/ Pain       Pain Assessment: No/denies pain Pain Intervention(s): Monitored during session;Repositioned         Frequency  Min 2X/week        Progress Toward Goals  OT Goals(current goals can now be found in  the care plan section)  Progress towards OT goals: Progressing toward goals  Acute Rehab OT Goals Patient Stated Goal: asking what brought all this on OT Goal Formulation: Patient unable to participate in goal setting Time For Goal Achievement: 01/21/19 Potential to Achieve Goals: Good  Plan Discharge plan remains appropriate       AM-PAC OT "6 Clicks" Daily Activity     Outcome Measure   Help from another person eating meals?: A Little Help from another person taking care of personal grooming?: A Little Help from another person toileting, which includes using toliet, bedpan,  or urinal?: Total Help from another person bathing (including washing, rinsing, drying)?: A Lot Help from another person to put on and taking off regular upper body clothing?: A Lot Help from another person to put on and taking off regular lower body clothing?: Total 6 Click Score: 12    End of Session Equipment Utilized During Treatment: Oxygen  OT Visit Diagnosis: Unsteadiness on feet (R26.81);Muscle weakness (generalized) (M62.81)   Activity Tolerance Patient tolerated treatment well   Patient Left in chair;with call bell/phone within reach;with chair alarm set   Nurse Communication Mobility status;Precautions        Time: [phone] OT Time Calculation (min): 33 min  Charges: OT General Charges $OT Visit: 1 Visit OT Treatments $Self Care/Home Management : 8-22 mins    [place], [place] 01/13/2019, 11:02 AM

## 2019-01-13 NOTE — Progress Notes (Signed)
Physical Therapy Treatment Patient Details Name: Nathan [name] MRN: [id number] DOB: 1951/05/01 Today's Date: 01/13/2019    History of Present Illness Pt fell at home and was on floor x 4 days without food or water. Pt found to be septic with PNA and have rhabdomyolosis. Pt has required bipap and HFNC. PMH - rt AKA, HTN, DM, PVD, MS, HIV    PT Comments    Patient tolerating sitting EOB and able to balance himself briefly with UE support.  Fatigued after about 7 minutes and returned to supine, feeling light headed but BP WNL.  Able to return to sit for applying lift pad and using lift for OOB.  Limited with scooting due to reports of muscle defect on R glut.  Plans to have someone bring in his leg tomorrow.  PT to follow.    Follow Up Recommendations  SNF;Supervision/Assistance - 24 hour     Equipment Recommendations  Other (comment)(TBA)    Recommendations for Other Services       Precautions / Restrictions Precautions Precautions: Fall Precaution Comments: watch SpO2, R AKA Restrictions Weight Bearing Restrictions: No    Mobility  Bed Mobility Overal bed mobility: Needs Assistance Bed Mobility: Rolling;Sidelying to Sit Rolling: Mod assist   Supine to sit: Mod assist;+2 for safety/equipment Sit to supine: Mod assist;+2 for safety/equipment   General bed mobility comments: cues for using rail and where to place hands to help him up to sit, assist for leg back into bed  Transfers Overall transfer level: Needs assistance               General transfer comment: OOB to chair via lift with assist for positioning once in chair for improved postural control  Ambulation/Gait                 Stairs             Wheelchair Mobility    Modified Rankin (Stroke Patients Only)       Balance Overall balance assessment: Needs assistance Sitting-balance support: Bilateral upper extremity supported Sitting balance-Leahy Scale: Poor Sitting balance -  Comments: leaning back on hands with S to min A for balance at EOB, feels weak and limited by lightheadeness sitting, BP WNL Postural control: Posterior lean                                  Cognition Arousal/Alertness: Awake/alert Behavior During Therapy: WFL for tasks assessed/performed Overall Cognitive Status: Impaired/Different from baseline Area of Impairment: Attention;Following commands                   Current Attention Level: Sustained   Following Commands: Follows one step commands consistently     Problem Solving: Slow processing;Decreased initiation;Requires verbal cues General Comments: Pt initially stating that he was "paralyzed" and could not participate in bed mobility, however able to complete with mod assist and +2 for emotional support      Exercises      General Comments General comments (skin integrity, edema, etc.): on 8L O2, maintaining O2 sats WNL      Pertinent Vitals/Pain Pain Assessment: No/denies pain Pain Intervention(s): Monitored during session;Repositioned    Home Living                      Prior Function            PT Goals (current goals can now  be found in the care plan section) Acute Rehab PT Goals Patient Stated Goal: asking what brought all this on Progress towards PT goals: Progressing toward goals    Frequency    Min 3X/week      PT Plan Current plan remains appropriate    Co-evaluation PT/OT/SLP Co-Evaluation/Treatment: Yes Reason for Co-Treatment: To address functional/ADL transfers;For patient/therapist safety PT goals addressed during session: Mobility/safety with mobility;Balance        AM-PAC PT "6 Clicks" Mobility   Outcome Measure  Help needed turning from your back to your side while in a flat bed without using bedrails?: A Lot Help needed moving from lying on your back to sitting on the side of a flat bed without using bedrails?: Total Help needed moving to and from a  bed to a chair (including a wheelchair)?: Total Help needed standing up from a chair using your arms (e.g., wheelchair or bedside chair)?: Total Help needed to walk in hospital room?: Total Help needed climbing 3-5 steps with a railing? : Total 6 Click Score: 7    End of Session Equipment Utilized During Treatment: Oxygen;Other (comment)(maximove) Activity Tolerance: Patient limited by fatigue Patient left: in chair;with call bell/phone within reach;with chair alarm set Nurse Communication: Mobility status;Need for lift equipment PT Visit Diagnosis: Other abnormalities of gait and mobility (R26.89);History of falling (Z91.81);Muscle weakness (generalized) (M62.81)     Time: [id number] PT Time Calculation (min) (ACUTE ONLY): 35 min  Charges:  $Therapeutic Activity: 8-22 mins                     Nathan [name], PT Acute Rehabilitation Services [phone] 01/13/2019    Nathan [name] 01/13/2019, 12:23 PM

## 2019-01-13 NOTE — Progress Notes (Signed)
   Subjective: Pt seen at the bedside on rounds this AM. Patient was sitting up in the chair and eating breakfast. Patient reports improvement in strength. No complaints at this time.   Objective:  Vital signs in last 24 hours: Vitals:   01/12/19 2044 01/12/19 2300 01/13/19 0300 01/13/19 0400  BP:  (!) 111/54 (!) 107/54   Pulse: 80 81 73   Resp: '17 19 20   '$ Temp:  99.8 F (37.7 C) 99.1 F (37.3 C)   TempSrc:  Oral Oral   SpO2: 90% 93% 94%   Weight:    69.9 kg  Height:       Physical Exam: General: Comfortable appearing male sitting up in the bedside chair. NAD HEENT: NCAT, nasal cannula in place CV: RRR, normal S1 & S2, no murmurs rubs or gallops appreicated PULM: CTAB, no crackles or wheezing. Slightly diminished L sided breath sounds MSK: R AKA, well healed NEURO: Alert and oriented x4, no focal deficits  Assessment/Plan:  Principal Problem:   Acute respiratory failure with hypoxia ([place]) Active Problems:   Human immunodeficiency virus I infection ([place])   DM type 2 with diabetic peripheral neuropathy ([place])   Community acquired pneumonia   Macrocytic anemia   Non-traumatic rhabdomyolysis   Sepsis with acute liver failure without hepatic coma or septic shock (HCC)   Pleural effusion  In summary, the patient is a 68 year old gentleman with a hx of HIV, T2DM, MS, and microcytic anemia who presented after being found down at home for several days. Workup revealed LLL pneumonia and rhabdomyolysis. He was treated with IVF and a 7 day course of ceftriaxone & azithro. His recovery was complicated by a persistent L sided pleural effusion for which he underwent uncomplicated thoracentesis on 9/4. He was been weaned to 4 L of supplemental oxygen.   #LLL pneumonia #L pleural effusion #Hypoxia: The patient is now weaned down to 4L of high flow from 15L earlier in the week. We are continually encouraging the use of a his incentive spirometry, flutter valve, pulmonary toileting, and  scheduled duo-nebs. Pt continues to spend time in the chair. - The patient has met the goal Goal of 4L of O2 supplementation required for discharge.    #T2DM: Home regimen is Lantus 70U nightly and Humalog 22U 3 times daily.  Has required less insulin during his hospitalization. - Continue Lantus 30 units nightly - Continue SSI with addition of 2 units at mealtime  Dispo: Anticipated discharge to [facility] tomorrow per CWS.   Earlene Plater, MD Internal Medicine, PGY1 Pager: [phone]  01/13/2019,12:01 PM

## 2019-01-13 NOTE — Progress Notes (Signed)
Pt desat into mid 80's on 2L o2 HFNC. Now 3L o2 HFNC; sats now in 90's.

## 2019-01-14 LAB — PATHOLOGIST SMEAR REVIEW

## 2019-01-14 LAB — GLUCOSE, CAPILLARY
Glucose-Capillary: 207 mg/dL — ABNORMAL HIGH (ref 70–99)
Glucose-Capillary: 219 mg/dL — ABNORMAL HIGH (ref 70–99)
Glucose-Capillary: 230 mg/dL — ABNORMAL HIGH (ref 70–99)
Glucose-Capillary: 258 mg/dL — ABNORMAL HIGH (ref 70–99)

## 2019-01-14 LAB — SARS CORONAVIRUS 2 (TAT 6-24 HRS): SARS Coronavirus 2: NEGATIVE

## 2019-01-14 MED ORDER — INSULIN ASPART 100 UNIT/ML ~~LOC~~ SOLN
3.0000 [IU] | Freq: Three times a day (TID) | SUBCUTANEOUS | Status: DC
  Administered 2019-01-14: 3 [IU] via SUBCUTANEOUS

## 2019-01-14 MED ORDER — INSULIN GLARGINE 100 UNIT/ML ~~LOC~~ SOLN
35.0000 [IU] | Freq: Every day | SUBCUTANEOUS | Status: DC
  Administered 2019-01-14: 35 [IU] via SUBCUTANEOUS
  Filled 2019-01-14 (×2): qty 0.35

## 2019-01-14 MED ORDER — HUMALOG KWIKPEN 100 UNIT/ML ~~LOC~~ SOPN: 12.0000 [IU] | PEN_INJECTOR | Freq: Three times a day (TID) | SUBCUTANEOUS | 12 refills | Status: DC

## 2019-01-14 MED ORDER — TOUJEO MAX SOLOSTAR 300 UNIT/ML ~~LOC~~ SOPN: 35.0000 [IU] | PEN_INJECTOR | Freq: Every day | SUBCUTANEOUS | 3 refills | Status: DC

## 2019-01-14 NOTE — Progress Notes (Signed)
Inpatient Diabetes Program Recommendations  AACE/ADA: New Consensus Statement on Inpatient Glycemic Control   Target Ranges:  Prepandial:   less than 140 mg/dL      Peak postprandial:   less than 180 mg/dL (1-2 hours)      Critically ill patients:  140 - 180 mg/dL   Results for VERDUN, ACKERMANN (MRN [id number]) as of 01/14/2019 15:21  Ref. Range 01/13/2019 07:57 01/13/2019 11:38 01/13/2019 16:47 01/13/2019 20:35 01/14/2019 07:28 01/14/2019 11:57  Glucose-Capillary Latest Ref Range: 70 - 99 mg/dL 200 (H) 249 (H) 313 (H) 325 (H) 207 (H) 219 (H)  Results for ROBERTO, GRAESER (MRN [id number]) as of 01/14/2019 15:21  Ref. Range 11/11/2018 14:50 12/29/2018 10:55  Hemoglobin A1C Latest Ref Range: 4.8 - 5.6 % 7.4 (A) 6.8 (H)   Review of Glycemic Control  Diabetes history: DM2 Outpatient Diabetes medications: Toujeo 70 units QHS, Humalog 22 units TID with meals, Actos 30 mg daily, Victoza 1.8 mg QAM Current orders for Inpatient glycemic control: Lantus 35 units QHS, Novolog 3 units TID with meals, Novolog 0-20 units TID with meals, Novolog 0-5 units QHS  Inpatient Diabetes Program Recommendations:   Insulin-Basal: Noted Lantus increased from 30 to 35 units QHS today.  Insulin-Meal Coverage: Please consider increasing meal coverage to Novolog 8 units TID with meals if patient eats at least 50% of meals.  Thanks, Barnie Alderman, RN, MSN, CDE Diabetes Coordinator Inpatient Diabetes Program [phone] (Team Pager from 8am to 5pm)

## 2019-01-14 NOTE — Progress Notes (Signed)
   Subjective: Pt seen at the bedside on rounds this AM. Pt on 3L supplemental O2 with saturations to the mid 90s. Pt says he feels okay today. No complaints at this time.   Objective:  Vital signs in last 24 hours: Vitals:   01/14/19 0552 01/14/19 0557 01/14/19 0602 01/14/19 0611  BP:      Pulse: 73 77 74 79  Resp: 20 20 (!) 21 18  Temp:      TempSrc:      SpO2: (!) 89% 91% 92% 90%  Weight:      Height:       Physical Exam: General: NAD HEENT: NCAT, nasal cannula in place CV: RRR, normal S1 and S2 no murmurs, rubs or gallops appreciated PULM: Clear to auscultation bilaterally, no crackles appreciated NEURO: Alert and oriented x4, no focal deficits  Assessment/Plan:  Principal Problem:   Acute respiratory failure with hypoxia (HCC) Active Problems:   Human immunodeficiency virus I infection (HCC)   DM type 2 with diabetic peripheral neuropathy (HCC)   Community acquired pneumonia   Macrocytic anemia   Non-traumatic rhabdomyolysis   Sepsis with acute liver failure without hepatic coma or septic shock (HCC)   Pleural effusion  In summary, the patient is a 68 year old gentleman with a hx of HIV, T2DM, MS, and microcytic anemia who presented after being found down at home for several days. Workup revealed LLL pneumonia and rhabdomyolysis. He was treated with IVF and a 7 day course of ceftriaxone & azithro. His recovery was complicated by a persistent L sided pleural effusion for which he underwent uncomplicated thoracentesis on 9/4. He was been weaned to 4 L of supplemental oxygen.   #LLL pneumonia #L pleural effusion #Hypoxia: The patient is now weaned down to 3L of high flow from [place] in the week. We are continuallyencouraging the use of a his incentivespirometry, flutter valve, pulmonary toileting, and scheduled duo-nebs. Pt continues to spend time in the chair. -  Pt's respiratory status has markedly improved and is ready for discharge to SNF  #T2DM: Home  regimen is Lantus 70U nightly and Humalog 22U 3 times daily.  Has required less insulin during his hospitalization. - Continue Lantus 30 units nightly -Continue SSI with addition of 2 units at mealtime  Dispo: Anticipated discharge to [facility] today.   Earlene Plater, MD Internal Medicine, PGY1 Pager: [phone]  01/14/2019,10:59 AM

## 2019-01-15 DIAGNOSIS — F329 Major depressive disorder, single episode, unspecified: Secondary | ICD-10-CM | POA: Diagnosis not present

## 2019-01-15 DIAGNOSIS — J90 Pleural effusion, not elsewhere classified: Secondary | ICD-10-CM | POA: Diagnosis not present

## 2019-01-15 DIAGNOSIS — J189 Pneumonia, unspecified organism: Secondary | ICD-10-CM | POA: Diagnosis not present

## 2019-01-15 DIAGNOSIS — G35 Multiple sclerosis: Secondary | ICD-10-CM | POA: Diagnosis not present

## 2019-01-15 DIAGNOSIS — Z794 Long term (current) use of insulin: Secondary | ICD-10-CM | POA: Diagnosis not present

## 2019-01-15 DIAGNOSIS — D509 Iron deficiency anemia, unspecified: Secondary | ICD-10-CM | POA: Diagnosis not present

## 2019-01-15 DIAGNOSIS — Z7401 Bed confinement status: Secondary | ICD-10-CM | POA: Diagnosis not present

## 2019-01-15 DIAGNOSIS — M2550 Pain in unspecified joint: Secondary | ICD-10-CM | POA: Diagnosis not present

## 2019-01-15 DIAGNOSIS — E1151 Type 2 diabetes mellitus with diabetic peripheral angiopathy without gangrene: Secondary | ICD-10-CM | POA: Diagnosis not present

## 2019-01-15 DIAGNOSIS — Z21 Asymptomatic human immunodeficiency virus [HIV] infection status: Secondary | ICD-10-CM | POA: Diagnosis not present

## 2019-01-15 DIAGNOSIS — I739 Peripheral vascular disease, unspecified: Secondary | ICD-10-CM | POA: Diagnosis not present

## 2019-01-15 DIAGNOSIS — M6281 Muscle weakness (generalized): Secondary | ICD-10-CM | POA: Diagnosis not present

## 2019-01-15 DIAGNOSIS — M6282 Rhabdomyolysis: Secondary | ICD-10-CM | POA: Diagnosis not present

## 2019-01-15 DIAGNOSIS — D689 Coagulation defect, unspecified: Secondary | ICD-10-CM | POA: Diagnosis not present

## 2019-01-15 DIAGNOSIS — E1142 Type 2 diabetes mellitus with diabetic polyneuropathy: Secondary | ICD-10-CM | POA: Diagnosis not present

## 2019-01-15 DIAGNOSIS — R1312 Dysphagia, oropharyngeal phase: Secondary | ICD-10-CM | POA: Diagnosis not present

## 2019-01-15 DIAGNOSIS — I959 Hypotension, unspecified: Secondary | ICD-10-CM | POA: Diagnosis not present

## 2019-01-15 DIAGNOSIS — K219 Gastro-esophageal reflux disease without esophagitis: Secondary | ICD-10-CM | POA: Diagnosis not present

## 2019-01-15 DIAGNOSIS — J918 Pleural effusion in other conditions classified elsewhere: Secondary | ICD-10-CM | POA: Diagnosis not present

## 2019-01-15 DIAGNOSIS — B370 Candidal stomatitis: Secondary | ICD-10-CM | POA: Diagnosis not present

## 2019-01-15 DIAGNOSIS — M5136 Other intervertebral disc degeneration, lumbar region: Secondary | ICD-10-CM | POA: Diagnosis not present

## 2019-01-15 DIAGNOSIS — B20 Human immunodeficiency virus [HIV] disease: Secondary | ICD-10-CM | POA: Diagnosis not present

## 2019-01-15 DIAGNOSIS — Z882 Allergy status to sulfonamides status: Secondary | ICD-10-CM

## 2019-01-15 DIAGNOSIS — E1169 Type 2 diabetes mellitus with other specified complication: Secondary | ICD-10-CM | POA: Diagnosis not present

## 2019-01-15 DIAGNOSIS — K635 Polyp of colon: Secondary | ICD-10-CM | POA: Diagnosis not present

## 2019-01-15 DIAGNOSIS — R110 Nausea: Secondary | ICD-10-CM | POA: Diagnosis not present

## 2019-01-15 DIAGNOSIS — R64 Cachexia: Secondary | ICD-10-CM | POA: Diagnosis not present

## 2019-01-15 DIAGNOSIS — J069 Acute upper respiratory infection, unspecified: Secondary | ICD-10-CM | POA: Diagnosis not present

## 2019-01-15 DIAGNOSIS — I10 Essential (primary) hypertension: Secondary | ICD-10-CM | POA: Diagnosis not present

## 2019-01-15 DIAGNOSIS — R0902 Hypoxemia: Secondary | ICD-10-CM | POA: Diagnosis not present

## 2019-01-15 DIAGNOSIS — J9601 Acute respiratory failure with hypoxia: Secondary | ICD-10-CM | POA: Diagnosis not present

## 2019-01-15 DIAGNOSIS — J181 Lobar pneumonia, unspecified organism: Secondary | ICD-10-CM | POA: Diagnosis not present

## 2019-01-15 DIAGNOSIS — R52 Pain, unspecified: Secondary | ICD-10-CM | POA: Diagnosis not present

## 2019-01-15 DIAGNOSIS — L02221 Furuncle of abdominal wall: Secondary | ICD-10-CM | POA: Diagnosis not present

## 2019-01-15 DIAGNOSIS — L0292 Furuncle, unspecified: Secondary | ICD-10-CM | POA: Diagnosis not present

## 2019-01-15 DIAGNOSIS — Z79899 Other long term (current) drug therapy: Secondary | ICD-10-CM | POA: Diagnosis not present

## 2019-01-15 DIAGNOSIS — B182 Chronic viral hepatitis C: Secondary | ICD-10-CM | POA: Diagnosis not present

## 2019-01-15 DIAGNOSIS — R911 Solitary pulmonary nodule: Secondary | ICD-10-CM | POA: Diagnosis not present

## 2019-01-15 DIAGNOSIS — E7800 Pure hypercholesterolemia, unspecified: Secondary | ICD-10-CM | POA: Diagnosis not present

## 2019-01-15 LAB — GLUCOSE, CAPILLARY
Glucose-Capillary: 224 mg/dL — ABNORMAL HIGH (ref 70–99)
Glucose-Capillary: 146 mg/dL — ABNORMAL HIGH (ref 70–99)

## 2019-01-15 NOTE — Care Management Important Message (Signed)
Important Message  Patient Details  Name: NAYSHAWN BUMBALOUGH MRN: [id number] Date of Birth: [birth year]   Medicare Important Message Given:  Yes     Jatinder Mcdonagh 01/15/2019, 1:14 PM

## 2019-01-15 NOTE — Progress Notes (Signed)
   Subjective: Patient seen at the bedside on rounds this morning.  Only on 1 L supplemental oxygen.  Patient says he is ready to go to rehab.  No other complaints at this time.  Objective:  Vital signs in last 24 hours: Vitals:   01/14/19 2000 01/14/19 2202 01/14/19 2326 01/15/19 0350  BP:  112/61 (!) 101/46 117/70  Pulse:   79 83  Resp:   (!) 21 18  Temp:   98.2 F (36.8 C) 98.1 F (36.7 C)  TempSrc:   Axillary Oral  SpO2: 94%  95% 95%  Weight:      Height:       Physical Exam: General: NAD HEENT: NCAT, nasal cannula in place CV: RRR, normal S1 and S2, no murmurs or gallops appreciated PULM: Clear to auscultation bilaterally Neuro: Alert and oriented, no focal deficits  Assessment/Plan:  Principal Problem:   Acute respiratory failure with hypoxia (HCC) Active Problems:   Human immunodeficiency virus I infection (HCC)   DM type 2 with diabetic peripheral neuropathy (HCC)   Community acquired pneumonia   Macrocytic anemia   Non-traumatic rhabdomyolysis   Sepsis with acute liver failure without hepatic coma or septic shock (HCC)   Pleural effusion  In summary, the patient is a 68 year old gentleman with ahx ofHIV, T2DM,MS, andmicrocytic anemia who presented after being found down at home for several days. Workup revealed LLLpneumonia and rhabdomyolysis. He was treated with IVF and a 7 day course of ceftriaxone & azithro. His recovery was complicated by a persistent L sided pleural effusion for which he underwent uncomplicated thoracentesis on 9/4. He was been weaned to 1L of supplemental oxygen as of today.  #LLL pneumonia #L pleural effusion #Hypoxia: Resolved. The patient isnow weaned down to 1L of high flow from 15L earlier in his hosptial course. We are continuallyencouraging the use of a his incentivespirometry, flutter valve, pulmonary toileting. - Pt's respiratory status has markedly improved and close to baseline. He is medically stable and ready for  discharge to SNF.  #T2DM:Home regimen is Lantus 70Unightly and Humalog 22U3 times daily. Has required less insulin during his hospitalization. -Continue Lantus 30 units nightly -Continue SSI with addition of 2 units at mealtime   Dispo: Anticipated [facility] today.   Earlene Plater, MD Internal Medicine, PGY1 Pager: [phone]  01/15/2019,1:21 PM

## 2019-01-15 NOTE — NC FL2 (Signed)
[place] LEVEL OF CARE SCREENING TOOL     IDENTIFICATION  Patient Name: Nathan [name] Birthdate: 28-Sep-1950 Sex: male Admission Date (Current Location): 12/29/2018  [facility] [place] Number:  Herbalist and Address:  [facility] [place] [address] [place], [place] [address]      Provider Number: [id number]  Attending Physician Name and Address:  Axel Filler, *  Relative Name and Phone Number:       Current Level of Care: Hospital Recommended Level of Care: [place] Prior Approval Number:    Date Approved/Denied:   PASRR Number: [id number]  Discharge Plan: SNF    Current Diagnoses: Patient Active Problem List   Diagnosis Date Noted  . Pleural effusion   . Non-traumatic rhabdomyolysis   . Sepsis with acute liver failure without hepatic coma or septic shock ([place])   . Macrocytic anemia 01/01/2019  . Acute respiratory failure with hypoxia ([place]) 12/30/2018  . Fall at home 12/29/2018  . Community acquired pneumonia 12/29/2018  . Cellulitis/Abscess of Right Anterior Chest 12/04/2018  . History of syphilis 10/09/2013  . Atherosclerosis of native artery of extremity with intermittent claudication ([place]) 04/08/2012  . Occlusion and stenosis of carotid artery without mention of cerebral infarction 12/11/2011  . Phantom limb syndrome (right lower limb) 11/23/2011  . Health care maintenance 11/23/2011  . Status post above knee amputation ([place]) 08/02/2009  . DM type 2 with diabetic peripheral neuropathy ([place]) 04/05/2009  . Benign essential HTN 02/13/2008  . Hyperlipidemia associated with type 2 diabetes mellitus ([place]) 07/29/2007  . Human immunodeficiency virus I infection ([place]) 02/12/2006  . COLONIC POLYPS, ADENOMATOUS 02/12/2006  . Multiple sclerosis ([place]) 02/12/2006  . PERIPHERAL VASCULAR DISEASE 02/12/2006  . GERD 02/12/2006  . Hepatitis B core antibody positive 02/12/2006    Orientation  RESPIRATION BLADDER Height & Weight     Self, Time, Situation, Place  O2([place] 2L) Incontinent Weight: 154 lb 1.6 oz (69.9 kg) Height:  6\' 1"  (185.4 cm)  BEHAVIORAL SYMPTOMS/MOOD NEUROLOGICAL BOWEL NUTRITION STATUS      Incontinent Diet(see DC summary)  AMBULATORY STATUS COMMUNICATION OF NEEDS Skin   Extensive Assist Verbally Normal                       Personal Care Assistance Level of Assistance  Bathing, Feeding, Dressing Bathing Assistance: Maximum assistance Feeding assistance: Independent Dressing Assistance: Maximum assistance     Functional Limitations Info  Sight, Hearing, Speech Sight Info: Adequate Hearing Info: Impaired(hard of hearing) Speech Info: Adequate    SPECIAL CARE FACTORS FREQUENCY  PT (By licensed PT), OT (By licensed OT)     PT Frequency: 5x/wk OT Frequency: 5x/wk            Contractures Contractures Info: Not present    Additional Factors Info  Code Status, Allergies, Insulin Sliding Scale Code Status Info: DNR Allergies Info: Sulfonamide Derivatives   Insulin Sliding Scale Info: 0-20 units 3x/day; 0-5 units daily at bed; 3 units 3x/dau with meals; Lantus 35 units at bed       Current Medications (01/15/2019):  This is the current hospital active medication list Current Facility-Administered Medications  Medication Dose Route Frequency Provider Last Rate Last Dose  . acetaminophen (TYLENOL) tablet 650 mg  650 mg Oral Q6H PRN Ina Homes, MD   650 mg at 01/04/19 1711   Or  . acetaminophen (TYLENOL) suppository 650 mg  650 mg Rectal Q6H PRN Ina Homes,  MD      . albuterol (PROVENTIL) (2.5 MG/3ML) 0.083% nebulizer solution 2.5 mg  2.5 mg Nebulization Q6H PRN Axel Filler, MD      . atorvastatin (LIPITOR) tablet 40 mg  40 mg Oral Daily Ina Homes, MD   40 mg at 01/15/19 0833  . bictegravir-emtricitabine-tenofovir AF (BIKTARVY) 50-200-25 MG per tablet 1 tablet  1 tablet Oral Daily Ina Homes, MD   1 tablet at  01/15/19 0841  . clopidogrel (PLAVIX) tablet 75 mg  75 mg Oral Daily Ina Homes, MD   75 mg at 01/15/19 0832  . enoxaparin (LOVENOX) injection 40 mg  40 mg Subcutaneous Q24H Ina Homes, MD   40 mg at 01/14/19 2008  . guaiFENesin (MUCINEX) 12 hr tablet 1,200 mg  1,200 mg Oral BID Ina Homes, MD   1,200 mg at 01/15/19 0833  . ibuprofen (ADVIL) tablet 600 mg  600 mg Oral Q6H PRN Earlene Plater, MD   600 mg at 01/07/19 1328  . insulin aspart (novoLOG) injection 0-20 Units  0-20 Units Subcutaneous TID WC Ina Homes, MD   7 Units at 01/15/19 0840  . insulin aspart (novoLOG) injection 0-5 Units  0-5 Units Subcutaneous QHS Ina Homes, MD   3 Units at 01/14/19 2201  . insulin aspart (novoLOG) injection 3 Units  3 Units Subcutaneous TID WC Kathi Ludwig, MD   3 Units at 01/14/19 1718  . insulin glargine (LANTUS) injection 35 Units  35 Units Subcutaneous QHS Kathi Ludwig, MD   35 Units at 01/14/19 2202  . ipratropium-albuterol (DUONEB) 0.5-2.5 (3) MG/3ML nebulizer solution 3 mL  3 mL Nebulization TID Joni Reining C, DO   3 mL at 01/14/19 1421  . lisinopril (ZESTRIL) tablet 40 mg  40 mg Oral QHS Ina Homes, MD   40 mg at 01/12/19 2141  . ondansetron (ZOFRAN) tablet 4 mg  4 mg Oral Q6H PRN Ina Homes, MD       Or  . ondansetron (ZOFRAN) injection 4 mg  4 mg Intravenous Q6H PRN Ina Homes, MD   4 mg at 12/30/18 1008  . pantoprazole (PROTONIX) EC tablet 40 mg  40 mg Oral Daily Ina Homes, MD   40 mg at 01/15/19 0832  . zidovudine (RETROVIR) capsule 300 mg  300 mg Oral BID Ina Homes, MD   300 mg at 01/15/19 [id number]     Discharge Medications: Please see discharge summary for a list of discharge medications.  Relevant Imaging Results:  Relevant Lab Results:   Additional Information SS#: [id number]  Geralynn Ochs, LCSW

## 2019-01-15 NOTE — TOC Transition Note (Signed)
Transition of Care [facility]) - CM/SW Discharge Note   Patient Details  Name: Nathan [name] MRN: [id number] Date of Birth: Jan 19, 1951  [facility] CM/SW Contact:  Geralynn Ochs, LCSW Phone Number: 01/15/2019, 12:33 PM   Clinical Narrative:   Nurse to call [phone], Room 308.  Transport set for 2:00 PM.    Final next level of care: [place] Barriers to Discharge: Barriers Resolved   Patient Goals and CMS Choice Patient states their goals for this hospitalization and ongoing recovery are:: to get stronger to go home      Discharge Placement              Patient chooses bed at: [facility] Patient to be transferred to facility by: [place] Name of family member notified: Legrand Como Patient and family notified of of transfer: 01/15/19  Discharge Plan and Services     Post Acute Care Choice: [place]                               Social Determinants of Health (SDOH) Interventions     Readmission Risk Interventions No flowsheet data found.

## 2019-01-16 ENCOUNTER — Non-Acute Institutional Stay (SKILLED_NURSING_FACILITY): Payer: Medicare Other | Admitting: Internal Medicine

## 2019-01-16 ENCOUNTER — Encounter: Payer: Self-pay | Admitting: Internal Medicine

## 2019-01-16 DIAGNOSIS — J90 Pleural effusion, not elsewhere classified: Secondary | ICD-10-CM

## 2019-01-16 DIAGNOSIS — R911 Solitary pulmonary nodule: Secondary | ICD-10-CM | POA: Diagnosis not present

## 2019-01-16 DIAGNOSIS — E1142 Type 2 diabetes mellitus with diabetic polyneuropathy: Secondary | ICD-10-CM

## 2019-01-16 DIAGNOSIS — J189 Pneumonia, unspecified organism: Secondary | ICD-10-CM

## 2019-01-16 DIAGNOSIS — B370 Candidal stomatitis: Secondary | ICD-10-CM | POA: Diagnosis not present

## 2019-01-16 DIAGNOSIS — M6282 Rhabdomyolysis: Secondary | ICD-10-CM

## 2019-01-16 DIAGNOSIS — J181 Lobar pneumonia, unspecified organism: Secondary | ICD-10-CM

## 2019-01-16 NOTE — Assessment & Plan Note (Signed)
Limited monitor at SNF today reveals fasting glucose of 214 and evening glucose of 271.  Insulin regimen will adjusted as per glucose trend.

## 2019-01-16 NOTE — Assessment & Plan Note (Addendum)
Monitor with PA/lateral CXR and CT as per established radiologic protocols for "doubling rate" once discharged from the SNF

## 2019-01-16 NOTE — Progress Notes (Signed)
NURSING HOME LOCATION:  [facility] ROOM NUMBER:  [id number]  CODE STATUS:  DNR  PCP:  Welford Roche, MD  [place] [address]  This is a comprehensive admission note to [facility] performed on this date less than 30 days from date of admission. Included are preadmission medical/surgical history; reconciled medication list; family history; social history and comprehensive review of systems.  Corrections and additions to the records were documented. Comprehensive physical exam was also performed. Additionally a clinical summary was entered for each active diagnosis pertinent to this admission in the Problem List to enhance continuity of care.  HPI: Patient was hospitalized 8/23- 01/15/2019 with left lower lobe pneumonia complicated by left effusion and hypoxia.  Additionally the patient presented with rhabdomyolysis. He presented via EMS after being found down in his apartment.  Temperature was 103.6 with tachypnea and tachycardia.  He exhibited altered mental status the time of presentation in the context of hypoxemia.  BiPAP was required initially. He was transitioned to high flow nasal oxygen, but he desaturated at 15 L into the mid 80s. Repeat chest x-ray 9/3 revealed a small to moderate sized left effusion.  Thoracentesis was performed under ultrasound guidance on 9/4.  Labs suggested that this was an exudative pleural fluid effusion. Following the thoracentesis the patient's oxygen need decreased and at discharge he was on 3 L/min. He received a 7-day course of ceftriaxone and azithromycin 8/22-8/29.  CK was 5224 admission but trended downward to 491 with IV fluids. His type 2 diabetes was treated with basal Lantus 30 units nightly and sliding scale with additional 2 units of NovoLog at mealtime.  Prior to admission he had been on 70 units of Lantus at bedtime and Humalog 22 units AC 3 times daily. On initial chest x-ray 8/23 a tiny nodular density was noted  at the right base.  Serial monitor and CT were to be considered after full recovery and rehab at the SNF.  Past medical and surgical history: Includes peripheral vascular disease, history of pneumocystis pneumonia, MS in remission, MRSA infection, essential hypertension, dyslipidemia, HIV, history of syphilis, chronic hepatitis C, GERD, colon polyps, and depression. Surgeries include right AKA, colonic polypectomy, and PVD stenting.  Social history: He is a former smoker having quit in 2009.  He is a nondrinker.  Family history: Extensive history reviewed.  Family history strongly positive for CAD and cancer.   Review of systems: He denies any significant upper or lower respiratory tract symptoms at this time.  He validates that he has a minor cough with occasional sputum.  He does not visualize it but swallows the material.  He denies any muscle pain but does have diffuse joint pain intermittently mainly of his back and ribs.  He states that he is having difficulty controlling bowel movements.  He also describes his bladder as being "paralyzed".  Apparently he has had the Foley catheter since his presentation to the hospital.  He describes sore throat from thrush.  Apparently the Magic mouthwash prescribed in the hospital was not continued.  Also on the SNF med list Retrovir twice a day is not documented.  Constitutional: No fever, significant weight change, fatigue  Eyes: No redness, discharge, pain, vision change ENT/mouth: No nasal congestion, purulent discharge, earache, change in hearing  Cardiovascular: No palpitations, paroxysmal nocturnal dyspnea, claudication, edema  Respiratory: No  hemoptysis, DOE, significant snoring, apnea  Gastrointestinal: No heartburn, dysphagia, abdominal pain, nausea /vomiting, rectal bleeding, melena, change in bowels Dermatologic: No  rash, pruritus, change in appearance of skin Neurologic: No dizziness, headache, syncope, seizures, numbness, tingling  Psychiatric: No significant anxiety, depression, insomnia, anorexia Endocrine: No change in hair/skin/nails, excessive thirst, excessive hunger, excessive urination  Hematologic/lymphatic: No significant bruising, lymphadenopathy, abnormal bleeding Allergy/immunology: No itchy/watery eyes, significant sneezing, urticaria, angioedema  Physical exam:  Pertinent or positive findings: He appears chronically ill.  He has pattern alopecia.  He has a mustache and closely cropped beard.  He has complete dentures.  There is the appearance of mild Candida over the buccal mucosa, left greater than right.  Breath sounds are decreased except for minor rales in the left lower lobe.  Heart sounds are markedly distant.  Foley catheter is in place.  AKA is present on the right.  The pedal pulses in the left lower extremity are decreased, especially dorsalis pedis pulses.  He is weak to opposition of the right upper extremity.  General appearance: no acute distress, increased work of breathing is present.   Lymphatic: No lymphadenopathy about the head, neck, axilla. Eyes: No conjunctival inflammation or lid edema is present. There is no scleral icterus. Ears:  External ear exam shows no significant lesions or deformities.   Nose:  External nasal examination shows no deformity or inflammation. Nasal mucosa are pink and moist without lesions, exudates Oral exam: Lips and gums are healthy appearing.There is no oropharyngeal erythema or exudate. Neck:  No thyromegaly, masses, tenderness noted.    Heart:  No gallop, murmur, click, rub.  Lungs:  without wheezes, rhonchi, rubs. Abdomen: Bowel sounds are normal.  Abdomen is soft and nontender with no organomegaly, hernias, masses. GU: Deferred  Extremities:  No cyanosis, clubbing, edema. Neurologic exam:Balance, Rhomberg, finger to nose testing could not be completed due to clinical state Skin: Warm & dry w/o tenting. No significant lesions or rash.  See clinical  summary under each active problem in the Problem List with associated updated therapeutic plan

## 2019-01-16 NOTE — Patient Instructions (Signed)
See assessment and plan under each diagnosis in the problem list and acutely for this visit 

## 2019-01-16 NOTE — Assessment & Plan Note (Addendum)
Repeat chest x-ray in event of tachypnea or hypoxia

## 2019-01-16 NOTE — Assessment & Plan Note (Signed)
Repeat chest x-ray only if recurrence of symptoms or hypoxia.  Post discharge from SNF the tiny right lower lobe nodule can be monitored periodically.

## 2019-01-17 NOTE — Assessment & Plan Note (Signed)
9/10 denies muscle pain

## 2019-02-03 ENCOUNTER — Non-Acute Institutional Stay (SKILLED_NURSING_FACILITY): Payer: Medicare Other | Admitting: Adult Health

## 2019-02-03 ENCOUNTER — Encounter: Payer: Self-pay | Admitting: Adult Health

## 2019-02-03 DIAGNOSIS — I10 Essential (primary) hypertension: Secondary | ICD-10-CM | POA: Diagnosis not present

## 2019-02-03 DIAGNOSIS — E1142 Type 2 diabetes mellitus with diabetic polyneuropathy: Secondary | ICD-10-CM | POA: Diagnosis not present

## 2019-02-03 DIAGNOSIS — L0292 Furuncle, unspecified: Secondary | ICD-10-CM | POA: Diagnosis not present

## 2019-02-03 NOTE — Progress Notes (Signed)
Location:  [facility] Room Number: 211-A Place of Service:  SNF (31) Provider:  Durenda Age, DNP, FNP-BC  Patient Care Team: Welford Roche, MD as PCP - General (Internal Medicine) Tommy Medal, Lavell Islam, MD as PCP - Infectious Diseases (Infectious Diseases) Plyler, Chauncey Reading, RD as Dietitian (Dietician) Milus Banister, MD as Attending Physician (Gastroenterology)  Extended Emergency Contact Information Primary Emergency Contact: Cecilio Asper, [place] [employer] Phone: [phone] Relation: Brother  Code Status:  DNR  Goals of care: Advanced Directive information Advanced Directives 12/29/2018  Does Patient Have a Medical Advance Directive? No  Does patient want to make changes to medical advance directive? -  Would patient like information on creating a medical advance directive? Yes (ED - Information included in AVS)  Pre-existing out of facility DNR order (yellow form or pink MOST form) -     Chief Complaint  Patient presents with  . Acute Visit    Patient is seen for a cyst-like growth in pubic area.    HPI:  Pt is a 68 y.o. male seen today for an acute visit secondary to a cystic-like growth in his pubic area. He is a short-term rehabilitation resident of [facility]. He has a PMH of asthma, blood dyscrasia, DDD, colon polyps, HTN, HLD, and MS.He was seen today in his room.  He said that he thought something bit him. Noted 2 erythematous indurated nodules on his supra pubic area. He said that it happened overnight and tender. No reported fever.  BPs noted with occasional hypotension. BPs 86/52, 132/82, 132/78, 138/72, 97/47, 122/60, 118/70. He takes Lisinopril for hypertension. He denies dizziness and headaches.   Past Medical History:  Diagnosis Date  . Asthma    per 2003 [facility] pulm records pfts   . Blood dyscrasia    HIV  . Clotting disorder ([place])   . Colon polyps     noted previous colonoscopy [facility]  . DDD (degenerative disc disease)    cervical spine  . Depression   . Diabetes mellitus    dx 2010  . Fever    [place] origin  . GERD (gastroesophageal reflux disease)   . Head swelling 05/23/2017  . Hep C w/o coma, chronic ([place])   . History of syphilis    noted [facility] records  . HIV infection ([place])    undetectable viral load and CD4 ct 667 as of 11/2011  . Hyperlipidemia   . Hypertension   . MRSA (methicillin resistant Staphylococcus aureus)   . Multiple sclerosis ([place])    in remission as of 11/2011 (diagnosed late 1980s)  . Pain in limb-Right Leg 10/13/2013  . 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 Pneumonia   . PVD (peripheral vascular disease) ([place])    Left Stent 07/02/2008  . Scalp lesion 07/26/2017  . UTI (urinary tract infection)   . Wears dentures    Past Surgical History:  Procedure Laterality Date  . ABDOMINAL AORTOGRAM W/LOWER EXTREMITY N/A 08/07/2017   Procedure: ABDOMINAL AORTOGRAM W/LOWER EXTREMITY;  Surgeon: Serafina Mitchell, MD;  Location: [facility] CV LAB;  Service: Cardiovascular;  Laterality: N/A;  . ABOVE KNEE LEG AMPUTATION     Right Leg  . COLONOSCOPY W/ BIOPSIES AND POLYPECTOMY    . LOWER EXTREMITY ANGIOGRAM Left 12/26/2011   Procedure: LOWER EXTREMITY ANGIOGRAM;  Surgeon: Serafina Mitchell, MD;  Location: [facility] CATH LAB;  Service:  Cardiovascular;  Laterality: Left;  . LOWER EXTREMITY ANGIOGRAM Left 08/17/2017   Procedure: LOWER EXTREMITY ANGIOGRAM LEFT LEG WITH RUNOFF AND Stenting.;  Surgeon: Serafina Mitchell, MD;  Location: [facility];  Service: Vascular;  Laterality: Left;  Marland Kitchen MULTIPLE TOOTH EXTRACTIONS    . OTHER SURGICAL HISTORY     right lower ext AKA with prothesis   . OTHER SURGICAL HISTORY     left left with stents (Dr. Seward Speck)  . OTHER SURGICAL HISTORY     2003 colonoscopy 5 mm polyp transverse colon; (2)23m  polyps in rectum-hyperplastic  . PERIPHERAL VASCULAR INTERVENTION Left 08/17/2017    Procedure: POPLITEAL STENT;  Surgeon: BSerafina Mitchell MD;  [place] [facility]  Service: Vascular;  Laterality: Left;    Allergies  Allergen Reactions  . Sulfonamide Derivatives Hives    Outpatient Encounter Medications as of 02/03/2019  Medication Sig  . atorvastatin (LIPITOR) 40 MG tablet TAKE 1 TABLET(40 MG) BY MOUTH DAILY  . bictegravir-emtricitabine-tenofovir AF (BIKTARVY) 50-200-25 MG TABS tablet Take 1 tablet by mouth daily.  . clopidogrel (PLAVIX) 75 MG tablet TAKE 1 TABLET BY MOUTH DAILY  . doxycycline (VIBRA-TABS) 100 MG tablet Take 100 mg by mouth 2 (two) times daily.  .Marland KitchenHUMALOG KWIKPEN 100 UNIT/ML KwikPen Inject 0.12 mLs (12 Units total) into the skin 3 (three) times daily.  . Insulin Glargine, 2 Unit Dial, (TOUJEO MAX SOLOSTAR) 300 UNIT/ML SOPN Inject 35 Units into the skin at bedtime.  . liraglutide (VICTOZA) 18 MG/3ML SOPN ADMINISTER 1.8 MG UNDER THE SKIN EVERY MORNING  . lisinopril (ZESTRIL) 30 MG tablet Take 30 mg by mouth daily. Hold if SBP <110  . Nutritional Supplement LIQD Take 1 each by mouth 2 (two) times daily. Health Shakes (prefers vanilla or strawberry)  . omeprazole (PRILOSEC) 40 MG capsule TAKE ONE CAPSULE BY MOUTH EVERY DAY  . ondansetron (ZOFRAN) 4 MG tablet Take 4 mg by mouth every 6 (six) hours as needed for nausea or vomiting.  . OXYGEN Inhale 3 L/min into the lungs continuous.  . pioglitazone (ACTOS) 30 MG tablet Take 1 tablet (30 mg total) by mouth daily.  .Marland Kitchensaccharomyces boulardii (FLORASTOR) 250 MG capsule Take 250 mg by mouth 2 (two) times daily.  . zidovudine (RETROVIR) 300 MG tablet TAKE 1 TABLET(300 MG) BY MOUTH TWICE DAILY  . Blood Glucose Monitoring Suppl (BAYER CONTOUR MONITOR) W/DEVICE KIT Use to check blood sugar as instructed up to 4 times a day (Patient not taking: Reported on 01/16/2019)  . glucose blood (CONTOUR NEXT TEST) test strip 1 each by Other route 4 (four) times daily. Use 4 times daily to check blood sugar. diag code E11.42. (Patient  not taking: Reported on 01/16/2019)  . Insulin Pen Needle (BD PEN NEEDLE NANO U/F) 32G X 4 MM MISC USE AS DIRECTED FOUR TIMES DAILY. Dx code  E11.42 (Patient not taking: Reported on 01/16/2019)  . Lancet Devices (BAYER MICROLET 2 LANCING DEVIC) MISC Use to check blood sugar up to 3 ties a day (Patient not taking: Reported on 01/16/2019)  . [DISCONTINUED] lisinopril (PRINIVIL,ZESTRIL) 40 MG tablet TAKE 1 TABLET BY MOUTH DAILY   No facility-administered encounter medications on file as of 02/03/2019.     Review of Systems  GENERAL: No change in appetite, no fatigue, no weight changes, no fever, chills or weakness MOUTH and THROAT: Denies oral discomfort, gingival pain or bleeding, pain from teeth or hoarseness   RESPIRATORY: no cough, SOB, DOE, wheezing, hemoptysis CARDIAC: No chest pain, edema or palpitations GI:  No abdominal pain, diarrhea, constipation, heart burn, nausea or vomiting GU: Denies dysuria, frequency, hematuria, or discharge NEUROLOGICAL: Denies dizziness, syncope, numbness, or headache PSYCHIATRIC: Denies feelings of depression or anxiety. No report of hallucinations, insomnia, paranoia, or agitation    Immunization History  Administered Date(s) Administered  . Influenza Split 04/12/2011, 01/18/2012  . Influenza Whole 02/12/2006, 02/06/2007, 02/13/2008, 02/25/2009, 03/15/2010  . Influenza,inj,Quad PF,6+ Mos 02/03/2013, 02/13/2014, 01/06/2016, 01/25/2017, 01/31/2018  . Influenza-Unspecified 03/10/2015  . Pneumococcal Conjugate-13 10/07/2015  . Pneumococcal Polysaccharide-23 02/12/2006, 02/25/2009, 03/19/2014   Pertinent  Health Maintenance Due  Topic Date Due  . INFLUENZA VACCINE  12/07/2018  . LIPID PANEL  01/17/2019  . OPHTHALMOLOGY EXAM  05/09/2019 (Originally 10/26/2018)  . FOOT EXAM  03/16/2019  . HEMOGLOBIN A1C  03/31/2019  . COLONOSCOPY  05/15/2021  . PNA vac Low Risk Adult  Completed   Fall Risk  12/09/2018 12/04/2018 06/14/2018 03/15/2018 01/31/2018  Falls in the  past year? 1 1 0 1 No  Number falls in past yr: 1 1 - 1 -  Comment - - - - -  Injury with Fall? 0 0 - 0 -  Comment - - - - -  Risk Factor Category  - - - - -  Comment - - - - -  Risk for fall due to : - Impaired balance/gait;Impaired mobility - Impaired balance/gait;Impaired mobility;History of fall(s) -  Risk for fall due to: Comment - - - - -  Follow up - Falls prevention discussed - - -     Vitals:   02/03/19 1432  BP: 132/82  Pulse: 90  Resp: 19  Temp: (!) 97.5 F (36.4 C)  TempSrc: Oral  SpO2: 99%  Weight: 148 lb (67.1 kg)  Height: 6' 1" (1.854 m)   Body mass index is 19.53 kg/m.  Physical Exam  GENERAL APPEARANCE: Well nourished. In no acute distress.  SKIN:  2 erythematous indurated nodules on supra pubic area MOUTH and THROAT: Lips are without lesions. Oral mucosa is moist and without lesions. Tongue is normal in shape, size, and color and without lesions RESPIRATORY: Breathing is even & unlabored, BS CTAB CARDIAC: RRR, no murmur,no extra heart sounds, no edema GI: Abdomen soft, normal BS, no masses, no tenderness EXTREMITIES:  Able to move X 4 extremities. Right AKA NEUROLOGICAL: There is no tremor. Speech is clear. Alert and oriented X 3. PSYCHIATRIC:  Affect and behavior are appropriate  Labs reviewed: Recent Labs    01/06/19 0651 01/11/19 0420 01/12/19 0628  NA 134* 134* 137  K 3.8 3.9 4.0  CL 101 100 103  CO2 _0 GLUCOSE 197* 193* 179*  BUN _1 CREATININE 0.58* 0.53* 0.54*  CALCIUM 8.4* 8.4* 8.6*   Recent Labs    12/29/18 1055 12/30/18 0530 01/02/19 0455 01/11/19 0420  AST 229* 170* 206*  --   ALT 72* 71* 75*  --   ALKPHOS 55 41 53  --   BILITOT 2.1* 1.1 0.9  --   PROT 7.7 6.1* 5.9* 5.8*  ALBUMIN 3.1* 2.3* 1.7*  --    Recent Labs    11/30/18 1406 12/29/18 1055  01/02/19 0544  01/10/19 0355 01/11/19 0420 01/12/19 0628  WBC 3.2* 9.5   < > 2.9*   < > 4.7 5.0 5.8  NEUTROABS 1.5* 7.6  --  1.8  --   --   --   --   HGB  11.3* 12.8*   < > 9.3*   < >  9.0* 8.9* 8.7*  HCT 33.2* 37.6*   < > 27.9*   < > 26.4* 25.9* 26.3*  MCV 116.1* 117.5*   < > 116.7*   < > 115.3* 115.6* 117.9*  PLT 141* 165   < > 142*   < > 288 282 303   < > = values in this interval not displayed.   Lab Results  Component Value Date   TSH 1.189 01/02/2019   Lab Results  Component Value Date   HGBA1C 6.8 (H) 12/29/2018   Lab Results  Component Value Date   CHOL 133 01/16/2018   HDL 39 (L) 01/16/2018   LDLCALC 71 01/16/2018   TRIG 142 01/16/2018   CHOLHDL 3.4 01/16/2018    Significant Diagnostic Results in last 30 days:  Dg Chest 2 View  Result Date: 01/09/2019 CLINICAL DATA:  Shortness of breath EXAM: CHEST - 2 VIEW COMPARISON:  A 11/25/2018 FINDINGS: Again noted is a airspace opacity involving the left lower lobe and likely the lingula, similar to prior study. The retrocardiac opacity has worsened since the prior study. There is a small to moderate size left-sided parapneumonic effusion. The heart size is stable. Aortic calcifications are noted. There may be a trace right-sided pleural effusion. No pneumothorax. There is a worsening opacity at the medial right lung base. IMPRESSION: Persistent pneumonia involving the left lower and left upper lobes with a small to moderate-sized left-sided parapneumonic effusion. Worsening opacity at the medial right lung base may represent an additional infiltrate. Electronically Signed   By: Constance Holster M.D.   On: 01/09/2019 19:56   Dg Chest Port 1 View  Result Date: 01/10/2019 CLINICAL DATA:  Shortness of breath. EXAM: PORTABLE CHEST 1 VIEW COMPARISON:  Radiographs of January 09, 2019. FINDINGS: The heart size and mediastinal contours are within normal limits. No pneumothorax or pleural effusion is noted. Decreased left basilar opacity is noted suggesting improving pneumonia. Stable right basilar opacity is noted concerning for atelectasis or pneumonia. The visualized skeletal structures are  unremarkable. IMPRESSION: Improved left lower lobe pneumonia or atelectasis is noted. Stable right basilar opacity is noted. Electronically Signed   By: Marijo Conception M.D.   On: 01/10/2019 16:47    Assessment/Plan  1. Furuncle - will start on Doxycycline 100 mg 1 tab BID X 10 days and Florastor 250 mg BID X 13 days, keep area clean   2. Benign essential HTN - has low BPs, will decrease Lisinopril from 40 mg to 30 mg daily, hold for SBP <110  3. DM type 2 with diabetic peripheral neuropathy (HCC) Lab Results  Component Value Date   HGBA1C 6.8 (H) 12/29/2018  -  Continue Victoza, Toujeo, Pioglitazone and Insulin Lispro    Family/ staff Communication:  Discussed plan of care with resident.  Labs/tests ordered:  None  Goals of care:   Short-term rehabilitation.    Durenda Age, DNP, FNP-BC [facility] [phone] (Monday-Friday 8:00 a.m. - 5:00 p.m.) [phone] (after hours)

## 2019-02-06 ENCOUNTER — Encounter: Payer: Self-pay | Admitting: Adult Health

## 2019-02-06 ENCOUNTER — Other Ambulatory Visit: Payer: Self-pay | Admitting: *Deleted

## 2019-02-06 ENCOUNTER — Non-Acute Institutional Stay (SKILLED_NURSING_FACILITY): Payer: Medicare Other | Admitting: Adult Health

## 2019-02-06 DIAGNOSIS — I739 Peripheral vascular disease, unspecified: Secondary | ICD-10-CM | POA: Diagnosis not present

## 2019-02-06 DIAGNOSIS — Z21 Asymptomatic human immunodeficiency virus [HIV] infection status: Secondary | ICD-10-CM

## 2019-02-06 DIAGNOSIS — I10 Essential (primary) hypertension: Secondary | ICD-10-CM | POA: Diagnosis not present

## 2019-02-06 DIAGNOSIS — E1142 Type 2 diabetes mellitus with diabetic polyneuropathy: Secondary | ICD-10-CM | POA: Diagnosis not present

## 2019-02-06 DIAGNOSIS — B20 Human immunodeficiency virus [HIV] disease: Secondary | ICD-10-CM

## 2019-02-06 DIAGNOSIS — L0292 Furuncle, unspecified: Secondary | ICD-10-CM | POA: Diagnosis not present

## 2019-02-06 DIAGNOSIS — J189 Pneumonia, unspecified organism: Secondary | ICD-10-CM

## 2019-02-06 MED ORDER — CLOPIDOGREL BISULFATE 75 MG PO TABS: 75.0000 mg | ORAL_TABLET | Freq: Every day | ORAL | 3 refills | Status: DC

## 2019-02-06 NOTE — Progress Notes (Signed)
Location:  [place] Room Number: 211/A Place of Service:  SNF (31) Provider:  Durenda Age, DNP, FNP-BC  Patient Care Team: Welford Roche, MD as PCP - General (Internal Medicine) Tommy Medal, Lavell Islam, MD as PCP - Infectious Diseases (Infectious Diseases) Plyler, Chauncey Reading, RD as Dietitian (Dietician) Milus Banister, MD as Attending Physician (Gastroenterology)  Extended Emergency Contact Information Primary Emergency Contact: Cecilio Asper, [place] [employer] Phone: [phone] Relation: Brother  Code Status:  DNR  Goals of care: Advanced Directive information Advanced Directives 02/06/2019  Does Patient Have a Medical Advance Directive? Yes  Type of Advance Directive Out of facility DNR (pink MOST or yellow form)  Does patient want to make changes to medical advance directive? -  Would patient like information on creating a medical advance directive? -  Pre-existing out of facility DNR order (yellow form or pink MOST form) Yellow form placed in chart (order not valid for inpatient use)     Chief Complaint  Patient presents with  . Medical Management of Chronic Issues    Routine visit of medical management    HPI:  Nathan [name] is a 68 y.o. male seen today for medical management of chronic diseases. He has PMH of asthma, blood dyscrasi, colon polyps, hypertension, hyperlipidemia and MS. He was recently started on Doxycycline for 2 boils on his suprapubic area. Area in erythematous and tender. CBGs 149, 173, 124, 151, 209, 204. He is currently on  Pioglitazone, Victoza, insulin lispro and Toujeo.  BPs 92/56, 134/70, 128/65, 131/73.  Lisinopril was recently decreased from 40 mg to 30 mg daily due to episodes of hypotension.   Past Medical History:  Diagnosis Date  . Asthma    per 2003 [facility] pulm records pfts   . Blood dyscrasia    HIV  . Clotting disorder ([place])   . Colon polyps    noted previous  colonoscopy [facility]  . DDD (degenerative disc disease)    cervical spine  . Depression   . Diabetes mellitus    dx 2010  . Fever    unknwon origin  . GERD (gastroesophageal reflux disease)   . Head swelling 05/23/2017  . Hep C w/o coma, chronic ([place])   . History of syphilis    noted [facility] records  . HIV infection ([place])    undetectable viral load and CD4 ct 667 as of 11/2011  . Hyperlipidemia   . Hypertension   . MRSA (methicillin resistant Staphylococcus aureus)   . Multiple sclerosis ([place])    in remission as of 11/2011 (diagnosed late 1980s)  . Pain in limb-Right Leg 10/13/2013  . 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[place])    2002  . Pneumonia   . PVD (peripheral vascular disease) ([place])    Left Stent 07/02/2008  . Scalp lesion 07/26/2017  . UTI (urinary tract infection)   . Wears dentures    Past Surgical History:  Procedure Laterality Date  . ABDOMINAL AORTOGRAM W/LOWER EXTREMITY N/A 08/07/2017   Procedure: ABDOMINAL AORTOGRAM W/LOWER EXTREMITY;  Surgeon: Serafina Mitchell, MD;  Location: [place] CV LAB;  Service: Cardiovascular;  Laterality: N/A;  . ABOVE KNEE LEG AMPUTATION     Right Leg  . COLONOSCOPY W/ BIOPSIES AND POLYPECTOMY    . LOWER EXTREMITY ANGIOGRAM Left 12/26/2011   Procedure: LOWER EXTREMITY ANGIOGRAM;  Surgeon: Serafina Mitchell, MD;  Location: [facility] CATH LAB;  Service: Cardiovascular;  Laterality: Left;  .  LOWER EXTREMITY ANGIOGRAM Left 08/17/2017   Procedure: LOWER EXTREMITY ANGIOGRAM LEFT LEG WITH RUNOFF AND Stenting.;  Surgeon: Serafina Mitchell, MD;  Location: [place];  Service: Vascular;  Laterality: Left;  Marland Kitchen MULTIPLE TOOTH EXTRACTIONS    . OTHER SURGICAL HISTORY     right lower ext AKA with prothesis   . OTHER SURGICAL HISTORY     left left with stents (Dr. Seward Speck)  . OTHER SURGICAL HISTORY     2003 colonoscopy 5 mm polyp transverse colon; (2)55m  polyps in rectum-hyperplastic  . PERIPHERAL VASCULAR INTERVENTION Left 08/17/2017   Procedure: POPLITEAL  STENT;  Surgeon: BSerafina Mitchell MD;  Location: [place]  Service: Vascular;  Laterality: Left;    Allergies  Allergen Reactions  . Sulfonamide Derivatives Hives    Outpatient Encounter Medications as of 02/06/2019  Medication Sig  . atorvastatin (LIPITOR) 40 MG tablet TAKE 1 TABLET(40 MG) BY MOUTH DAILY  . bictegravir-emtricitabine-tenofovir AF (BIKTARVY) 50-200-25 MG TABS tablet Take 1 tablet by mouth daily.  . bisacodyl (DULCOLAX) 10 MG suppository If not relieved by MOM, give 10 mg Bisacodyl suppositiory rectally X 1 dose in 24 hours as needed (Do not use constipation standing orders for residents with renal failure/CFR less than 30. Contact MD for orders) (Physician Order)  . clopidogrel (PLAVIX) 75 MG tablet Take 1 tablet (75 mg total) by mouth daily.  .Marland Kitchendoxycycline (VIBRA-TABS) 100 MG tablet Take 100 mg by mouth 2 (two) times daily.  . Insulin Glargine, 2 Unit Dial, (TOUJEO MAX SOLOSTAR) 300 UNIT/ML SOPN Inject 35 Units into the skin at bedtime.  . Insulin Lispro Junior KwikPen 100 UNIT/ML SOPN Inject 12 Units into the skin 3 (three) times daily. For DM  . liraglutide (VICTOZA) 18 MG/3ML SOPN ADMINISTER 1.8 MG UNDER THE SKIN EVERY MORNING  . lisinopril (ZESTRIL) 30 MG tablet Take 30 mg by mouth daily. Hold if SBP <110  . magnesium hydroxide (MILK OF MAGNESIA) 400 MG/5ML suspension If no BM in 3 days, give 30 cc Milk of Magnesium p.o. x 1 dose in 24 hours as needed (Do not use standing constipation orders for residents with renal failure CFR less than 30. Contact MD for orders) (Physician Order)  . Nutritional Supplement LIQD Take 1 each by mouth 2 (two) times daily. Health Shakes (prefers vanilla or strawberry)  . omeprazole (PRILOSEC) 40 MG capsule TAKE ONE CAPSULE BY MOUTH EVERY DAY  . ondansetron (ZOFRAN) 4 MG tablet Take 4 mg by mouth every 6 (six) hours as needed for nausea or vomiting.  . OXYGEN Inhale 3 L/min into the lungs continuous.  . pioglitazone (ACTOS) 30 MG tablet Take 1  tablet (30 mg total) by mouth daily.  .Marland Kitchensaccharomyces boulardii (FLORASTOR) 250 MG capsule Take 250 mg by mouth 2 (two) times daily.  . Sodium Phosphates (RA SALINE ENEMA RE) If not relieved by Biscodyl suppository, give disposable Saline Enema rectally X 1 dose/24 hrs as needed (Do not use constipation standing orders for residents with renal failure/CFR less than 30. Contact MD for orders)(Physician Or  . zidovudine (RETROVIR) 300 MG tablet TAKE 1 TABLET(300 MG) BY MOUTH TWICE DAILY  . Blood Glucose Monitoring Suppl (BAYER CONTOUR MONITOR) W/DEVICE KIT Use to check blood sugar as instructed up to 4 times a day (Patient not taking: Reported on 02/06/2019)  . glucose blood (CONTOUR NEXT TEST) test strip 1 each by Other route 4 (four) times daily. Use 4 times daily to check blood sugar. diag code E11.42. (Patient not taking: Reported  on 02/06/2019)  . Insulin Pen Needle (BD PEN NEEDLE NANO U/F) 32G X 4 MM MISC USE AS DIRECTED FOUR TIMES DAILY. Dx code  E11.42 (Patient not taking: Reported on 01/16/2019)  . Lancet Devices (BAYER MICROLET 2 LANCING DEVIC) MISC Use to check blood sugar up to 3 ties a day (Patient not taking: Reported on 01/16/2019)  . [DISCONTINUED] HUMALOG KWIKPEN 100 UNIT/ML KwikPen Inject 0.12 mLs (12 Units total) into the skin 3 (three) times daily.   No facility-administered encounter medications on file as of 02/06/2019.     Review of Systems  GENERAL: No change in appetite, no fatigue, no weight changes, no fever, chills or weakness MOUTH and THROAT: Denies oral discomfort, gingival pain or bleeding RESPIRATORY: no cough, SOB, DOE, wheezing, hemoptysis CARDIAC: No chest pain, edema or palpitations GI: No abdominal pain, diarrhea, constipation, heart burn, nausea or vomiting GU: Denies dysuria, frequency, hematuria, incontinence, or discharge NEUROLOGICAL: Denies dizziness, syncope, numbness, or headache PSYCHIATRIC: Denies feelings of depression or anxiety. No report of  hallucinations, insomnia, paranoia, or agitation    Immunization History  Administered Date(s) Administered  . Influenza Split 04/12/2011, 01/18/2012  . Influenza Whole 02/12/2006, 02/06/2007, 02/13/2008, 02/25/2009, 03/15/2010  . Influenza,inj,Quad PF,6+ Mos 02/03/2013, 02/13/2014, 01/06/2016, 01/25/2017, 01/31/2018  . Influenza-Unspecified 03/10/2015  . Pneumococcal Conjugate-13 10/07/2015  . Pneumococcal Polysaccharide-23 02/12/2006, 02/25/2009, 03/19/2014   Pertinent  Health Maintenance Due  Topic Date Due  . INFLUENZA VACCINE  12/07/2018  . LIPID PANEL  01/17/2019  . OPHTHALMOLOGY EXAM  05/09/2019 (Originally 10/26/2018)  . FOOT EXAM  03/16/2019  . HEMOGLOBIN A1C  03/31/2019  . COLONOSCOPY  05/15/2021  . PNA vac Low Risk Adult  Completed   Fall Risk  12/09/2018 12/04/2018 06/14/2018 03/15/2018 01/31/2018  Falls in the past year? 1 1 0 1 No  Number falls in past yr: 1 1 - 1 -  Comment - - - - -  Injury with Fall? 0 0 - 0 -  Comment - - - - -  Risk Factor Category  - - - - -  Comment - - - - -  Risk for fall due to : - Impaired balance/gait;Impaired mobility - Impaired balance/gait;Impaired mobility;History of fall(s) -  Risk for fall due to: Comment - - - - -  Follow up - Falls prevention discussed - - -     Vitals:   02/06/19 1618  BP: 134/70  Pulse: 78  Resp: 18  Temp: (!) 97.3 F (36.3 C)  TempSrc: Oral  SpO2: 92%  Weight: 151 lb 9.6 oz (68.8 kg)  Height: _0  (1.854 m)   Body mass index is 20 kg/m.  Physical Exam  GENERAL APPEARANCE: Well nourished. In no acute distress. Normal body habitus SKIN:  2 erythematous cystlike on suprapubic area MOUTH and THROAT: Lips are without lesions. Oral mucosa is moist and without lesions. Tongue is normal in shape, size, and color and without lesions RESPIRATORY: Breathing is even & unlabored, BS CTAB CARDIAC: RRR, no murmur,no extra heart sounds, no edema GI: Abdomen soft, normal BS, no masses, no tenderness,  EXTREMITIES: Able to move X 4 extremities, right AKA NEUROLOGICAL: There is no tremor. Speech is clear. Alert and oriented X 3. PSYCHIATRIC:  Affect and behavior are appropriate  Labs reviewed: Recent Labs    01/06/19 0651 01/11/19 0420 01/12/19 0628  NA 134* 134* 137  K 3.8 3.9 4.0  CL 101 100 103  CO2 _1 GLUCOSE 197* 193* 179*  BUN 14 13  10  CREATININE 0.58* 0.53* 0.54*  CALCIUM 8.4* 8.4* 8.6*   Recent Labs    12/29/18 1055 12/30/18 0530 01/02/19 0455 01/11/19 0420  AST 229* 170* 206*  --   ALT 72* 71* 75*  --   ALKPHOS 55 41 53  --   BILITOT 2.1* 1.1 0.9  --   PROT 7.7 6.1* 5.9* 5.8*  ALBUMIN 3.1* 2.3* 1.7*  --    Recent Labs    11/30/18 1406 12/29/18 1055  01/02/19 0544  01/10/19 0355 01/11/19 0420 01/12/19 0628  WBC 3.2* 9.5   < > 2.9*   < > 4.7 5.0 5.8  NEUTROABS 1.5* 7.6  --  1.8  --   --   --   --   HGB 11.3* 12.8*   < > 9.3*   < > 9.0* 8.9* 8.7*  HCT 33.2* 37.6*   < > 27.9*   < > 26.4* 25.9* 26.3*  MCV 116.1* 117.5*   < > 116.7*   < > 115.3* 115.6* 117.9*  PLT 141* 165   < > 142*   < > 288 282 303   < > = values in this interval not displayed.   Lab Results  Component Value Date   TSH 1.189 01/02/2019   Lab Results  Component Value Date   HGBA1C 6.8 (H) 12/29/2018   Lab Results  Component Value Date   CHOL 133 01/16/2018   HDL 39 (L) 01/16/2018   LDLCALC 71 01/16/2018   TRIG 142 01/16/2018   CHOLHDL 3.4 01/16/2018    Assessment/Plan  1. Community acquired pneumonia of left lower lobe of lung - completed antibiotic treatments, resolved   2. Human immunodeficiency virus I infection ([place]) -Continue Biktarvy and Retrovir  3. DM type 2 with diabetic peripheral neuropathy (HCC) Lab Results  Component Value Date   HGBA1C 6.8 (H) 12/29/2018   - well-controlled, continue pioglitazone, Victoza, Toujeo and insulin lispro lispro  4. Benign essential HTN - stable, continue Lisinopril  5. Furuncles  - continue Doxycycline and  Florastor, cleanse with NS, apply Bacitracin topically and cover with dry dressing daily       Family/ staff Communication:  Discussed plan of care with resident.  Labs/tests ordered:  None  Goals of care:  Short-term care    Durenda Age, DNP, FNP-BC [facility] and Adult Medicine [phone] (Monday-Friday 8:00 a.m. - 5:00 p.m.) [phone] (after hours)

## 2019-02-10 DIAGNOSIS — L02221 Furuncle of abdominal wall: Secondary | ICD-10-CM | POA: Diagnosis not present

## 2019-02-14 ENCOUNTER — Non-Acute Institutional Stay (SKILLED_NURSING_FACILITY): Payer: Medicare Other | Admitting: Adult Health

## 2019-02-14 ENCOUNTER — Encounter: Payer: Self-pay | Admitting: Adult Health

## 2019-02-14 DIAGNOSIS — B370 Candidal stomatitis: Secondary | ICD-10-CM

## 2019-02-14 DIAGNOSIS — E1151 Type 2 diabetes mellitus with diabetic peripheral angiopathy without gangrene: Secondary | ICD-10-CM

## 2019-02-14 DIAGNOSIS — L0292 Furuncle, unspecified: Secondary | ICD-10-CM | POA: Diagnosis not present

## 2019-02-14 NOTE — Progress Notes (Signed)
Location:  [place] Room Number: 211/A Place of Service:  SNF (31) Provider:  Durenda Age, DNP, FNP-BC  Patient Care Team: Nathan Roche, MD as PCP - General (Internal Medicine) Nathan [name], Nathan Islam, MD as PCP - Infectious Diseases (Infectious Diseases) Plyler, Nathan [name], RD as Dietitian (Dietician) Nathan Banister, MD as Attending Physician (Gastroenterology)  Extended Emergency Contact Information Primary Emergency Contact: Nathan [name], [place] [employer] Phone: [phone] Relation: Brother  Code Status:  DNR  Goals of care: Advanced Directive information Advanced Directives 02/14/2019  Does Patient Have a Medical Advance Directive? Yes  Type of Advance Directive Out of facility DNR (pink MOST or yellow form)  Does patient want to make changes to medical advance directive? -  Would patient like information on creating a medical advance directive? -  Pre-existing out of facility DNR order (yellow form or pink MOST form) Yellow form placed in chart (order not valid for inpatient use)     Chief Complaint  Patient presents with   Acute Visit    Oral Pain    HPI:  Pt is a 68 y.o. [name] seen today for for complaints of mouth being "raw'.Tongue noted to be erythematous. He reported that it has been hard for him to eat due to oral pain. He ws recently treated  with Doxycycline for furuncles on his suprapubic area. He said that Nystatin solution makes him nauseated.He has PMH of DM, HTN and HIV.   Past Medical History:  Diagnosis Date   Asthma    per 2003 [facility] pulm records pfts    Blood dyscrasia    HIV   Clotting disorder ([place])    Colon polyps    noted previous colonoscopy [facility]   DDD (degenerative disc disease)    cervical spine   Depression    Diabetes mellitus    dx 2010   Fever    unknwon origin   GERD (gastroesophageal reflux disease)    Head swelling 05/23/2017   Hep C  w/o coma, chronic (HCC)    History of syphilis    noted [facility] records   HIV infection ([place])    undetectable viral load and CD4 ct 667 as of 11/2011   Hyperlipidemia    Hypertension    MRSA (methicillin resistant Staphylococcus aureus)    Multiple sclerosis ([place])    in remission as of 11/2011 (diagnosed late 1980s)   Pain in limb-Right Leg 10/13/2013   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[place])    2002   Pneumonia    PVD (peripheral vascular disease) ([place])    Left Stent 07/02/2008   Scalp lesion 07/26/2017   UTI (urinary tract infection)    Wears dentures    Past Surgical History:  Procedure Laterality Date   ABDOMINAL AORTOGRAM W/LOWER EXTREMITY N/A 08/07/2017   Procedure: ABDOMINAL AORTOGRAM W/LOWER EXTREMITY;  Surgeon: Serafina Mitchell, MD;  Location: [place] [facility] LAB;  Service: Cardiovascular;  Laterality: N/A;   ABOVE KNEE LEG AMPUTATION     Right Leg   COLONOSCOPY W/ BIOPSIES AND POLYPECTOMY     LOWER EXTREMITY ANGIOGRAM Left 12/26/2011   Procedure: LOWER EXTREMITY ANGIOGRAM;  Surgeon: Serafina Mitchell, MD;  Location: [facility] C[facility] LAB;  Service: Cardiovascular;  Laterality: Left;   LOWER EXTREMITY ANGIOGRAM Left 08/17/2017   Procedure: LOWER EXTREMITY ANGIOGRAM LEFT LEG WITH RUNOFF AND Stenting.;  Surgeon: Serafina Mitchell, MD;  Location: [place];  Service:  Vascular;  Laterality: Left;   MULTIPLE TOOTH EXTRACTIONS     OTHER SURGICAL HISTORY     right lower ext AKA with prothesis    OTHER SURGICAL HISTORY     left left with stents (Dr. Seward Speck)   OTHER SURGICAL HISTORY     2003 colonoscopy 5 mm polyp transverse colon; (2)84m  polyps in rectum-hyperplastic   PERIPHERAL VASCULAR INTERVENTION Left 08/17/2017   Procedure: POPLITEAL STENT;  Surgeon: BSerafina Mitchell MD;  Location: [place]  Service: Vascular;  Laterality: Left;    Allergies  Allergen Reactions   Sulfonamide Derivatives Hives    Outpatient Encounter Medications as of 02/14/2019  Medication  Sig   atorvastatin (LIPITOR) 40 MG tablet TAKE 1 TABLET(40 MG) BY MOUTH DAILY   bictegravir-emtricitabine-tenofovir AF (BIKTARVY) 50-200-25 MG TABS tablet Take 1 tablet by mouth daily.   bisacodyl (DULCOLAX) 10 MG suppository If not relieved by MOM, give 10 mg Bisacodyl suppositiory rectally X 1 dose in 24 hours as needed (Do not use constipation standing orders for residents with renal failure/CFR less than 30. Contact MD for orders) (Physician Order)   clopidogrel (PLAVIX) 75 MG tablet Take 1 tablet (75 mg total) by mouth daily.   clotrimazole (MYCELEX) 10 MG troche Take 10 mg by mouth 5 (five) times daily.   Insulin Glargine, 2 Unit Dial, (TOUJEO MAX SOLOSTAR) 300 UNIT/ML SOPN Inject 35 Units into the skin at bedtime.   Insulin Lispro Junior KwikPen 100 UNIT/ML SOPN Inject 12 Units into the skin 3 (three) times daily. For DM   liraglutide (VICTOZA) 18 MG/3ML SOPN ADMINISTER 1.8 MG UNDER THE SKIN EVERY MORNING   lisinopril (ZESTRIL) 30 MG tablet Take 30 mg by mouth daily. Hold if SBP <110   magnesium hydroxide (MILK OF MAGNESIA) 400 MG/5ML suspension If no BM in 3 days, give 30 cc Milk of Magnesium p.o. x 1 dose in 24 hours as needed (Do not use standing constipation orders for residents with renal failure CFR less than 30. Contact MD for orders) (Physician Order)   NON FORMULARY ADD NSA MED PASS 120 ML TIWICE A DAY   Nutritional Supplement LIQD Take 1 each by mouth 2 (two) times daily. Health Shakes (prefers vanilla or strawberry)   omeprazole (PRILOSEC) 40 MG capsule TAKE ONE CAPSULE BY MOUTH EVERY DAY   ondansetron (ZOFRAN) 4 MG tablet Take 4 mg by mouth every 6 (six) hours as needed for nausea or vomiting.   OXYGEN Inhale 3 L/min into the lungs continuous.   pioglitazone (ACTOS) 30 MG tablet Take 1 tablet (30 mg total) by mouth daily.   saccharomyces boulardii (FLORASTOR) 250 MG capsule Take 250 mg by mouth 2 (two) times daily.   Sodium Phosphates (RA SALINE ENEMA RE) If not  relieved by Biscodyl suppository, give disposable Saline Enema rectally X 1 dose/24 hrs as needed (Do not use constipation standing orders for residents with renal failure/CFR less than 30. Contact MD for orders)(Physician Or   zidovudine (RETROVIR) 300 MG tablet TAKE 1 TABLET(300 MG) BY MOUTH TWICE DAILY   Blood Glucose Monitoring Suppl (BAYER CONTOUR MONITOR) W/DEVICE KIT Use to check blood sugar as instructed up to 4 times a day (Patient not taking: Reported on 02/06/2019)   glucose blood (CONTOUR NEXT TEST) test strip 1 each by Other route 4 (four) times daily. Use 4 times daily to check blood sugar. diag code E11.42. (Patient not taking: Reported on 02/06/2019)   Insulin Pen Needle (BD PEN NEEDLE NANO U/F) 32G X 4 MM  MISC USE AS DIRECTED FOUR TIMES DAILY. Dx code  E11.42 (Patient not taking: Reported on 01/16/2019)   Lancet Devices (BAYER MICROLET 2 LANCING DEVIC) MISC Use to check blood sugar up to 3 ties a day (Patient not taking: Reported on 01/16/2019)   No facility-administered encounter medications on file as of 02/14/2019.     Review of Systems  GENERAL: No change in appetite, no fatigue, no weight changes, no fever, chills or weakness MOUTH and THROAT: + oral discomfort RESPIRATORY: no cough, SOB, DOE, wheezing, hemoptysis CARDIAC: No chest pain, edema or palpitations GI: No abdominal pain, diarrhea, constipation, heart burn, nausea or vomiting GU: Denies dysuria, frequency, hematuria, incontinence, or discharge NEUROLOGICAL: Denies dizziness, syncope, numbness, or headache PSYCHIATRIC: Denies feelings of depression or anxiety. No report of hallucinations, insomnia, paranoia, or agitation   Immunization History  Administered Date(s) Administered   Influenza Split 04/12/2011, 01/18/2012   Influenza Whole 02/12/2006, 02/06/2007, 02/13/2008, 02/25/2009, 03/15/2010   Influenza,inj,Quad PF,6+ Mos 02/03/2013, 02/13/2014, 01/06/2016, 01/25/2017, 01/31/2018   Influenza-Unspecified  03/10/2015, 02/11/2019   Pneumococcal Conjugate-13 10/07/2015   Pneumococcal Polysaccharide-23 02/12/2006, 02/25/2009, 03/19/2014   Pertinent  Health Maintenance Due  Topic Date Due   LIPID PANEL  01/17/2019   OPHTHALMOLOGY EXAM  05/09/2019 (Originally 10/26/2018)   FOOT EXAM  03/16/2019   HEMOGLOBIN A1C  03/31/2019   COLONOSCOPY  05/15/2021   INFLUENZA VACCINE  Completed   PNA vac Low Risk Adult  Completed   Fall Risk  12/09/2018 12/04/2018 06/14/2018 03/15/2018 01/31/2018  Falls in the past year? 1 1 0 1 No  Number falls in past yr: 1 1 - 1 -  Comment - - - - -  Injury with Fall? 0 0 - 0 -  Comment - - - - -  Risk Factor Category  - - - - -  Comment - - - - -  Risk for fall due to : - Impaired balance/gait;Impaired mobility - Impaired balance/gait;Impaired mobility;History of fall(s) -  Risk for fall due to: Comment - - - - -  Follow up - Falls prevention discussed - - -     Vitals:   02/14/19 1626  BP: 139/70  Pulse: 60  Resp: 15  Temp: (!) 97 F (36.1 C)  TempSrc: Oral  SpO2: 92%  Weight: 150 lb 3.2 oz (68.1 kg)  Height: '6\' 1"'  (1.854 m)   Body mass index is 19.82 kg/m.  Physical Exam  GENERAL APPEARANCE: In no acute distress.  SKIN:  Has 2 raised area on suprapubic area with small amount of serosanguinous drainage MOUTH and THROAT: Lips are without lesions. Oral mucosa is moist and without lesions. Tongue is normal in shape, size, and has erythema RESPIRATORY: Breathing is even & unlabored, BS CTAB CARDIAC: RRR, no murmur,no extra heart sounds, no edema GI: Abdomen soft, normal BS, no masses, no tenderness EXTREMITIES:  Able to move X 4 extremities:  Right AKA NEUROLOGICAL: There is no tremor. Speech is clear.Alert and oriented X 3.  PSYCHIATRIC:  Affect and behavior are appropriate  Labs reviewed: Recent Labs    01/06/19 0651 01/11/19 0420 01/12/19 0628  NA 134* 134* 137  K 3.8 3.9 4.0  CL 101 100 103  CO2 '25 28 27  ' GLUCOSE 197* 193* 179*  BUN  '14 13 10  ' CREATININE 0.58* 0.53* 0.54*  CALCIUM 8.4* 8.4* 8.6*   Recent Labs    12/29/18 1055 12/30/18 0530 01/02/19 0455 01/11/19 0420  AST 229* 170* 206*  --   ALT 72* 71*  75*  --   ALKPHOS 55 41 53  --   BILITOT 2.1* 1.1 0.9  --   PROT 7.7 6.1* 5.9* 5.8*  ALBUMIN 3.1* 2.3* 1.7*  --    Recent Labs    11/30/18 1406 12/29/18 1055  01/02/19 0544  01/10/19 0355 01/11/19 0420 01/12/19 0628  WBC 3.2* 9.5   < > 2.9*   < > 4.7 5.0 5.8  NEUTROABS 1.5* 7.6  --  1.8  --   --   --   --   HGB 11.3* 12.8*   < > 9.3*   < > 9.0* 8.9* 8.7*  HCT 33.2* 37.6*   < > 27.9*   < > 26.4* 25.9* 26.3*  MCV 116.1* 117.5*   < > 116.7*   < > 115.3* 115.6* 117.9*  PLT 141* 165   < > 142*   < > 288 282 303   < > = values in this interval not displayed.   Lab Results  Component Value Date   TSH 1.189 01/02/2019   Lab Results  Component Value Date   HGBA1C 6.8 (H) 12/29/2018   Lab Results  Component Value Date   CHOL 133 01/16/2018   HDL 39 (L) 01/16/2018   LDLCALC 71 01/16/2018   TRIG 142 01/16/2018   CHOLHDL 3.4 01/16/2018     Assessment/Plan  1. Oral candida - will order Clotrimazole troche 10 mg 1 tab dissolve in the mouth 5X/day, oral care daily  2. Furuncle - S/P Doxycycline treatment and continues to be on Florastor, continue wound treatment daily, keep skin clean and dry  3. DM (diabetes mellitus), type 2 with peripheral vascular complications (HCC) Lab Results  Component Value Date   HGBA1C 6.8 (H) 12/29/2018  - CBGs stable, continue Pioglitazone, Victoza and Toujeo and CBG monitoring    Family/ staff Communication: Discussed plan of care with resident.  Labs/tests ordered:  None  Goals of care:   Short-term care  Durenda Age, DNP, FNP-BC [facility] and Adult Medicine [phone] (Monday-Friday 8:00 a.m. - 5:00 p.m.) [phone] (after hours)

## 2019-02-16 DIAGNOSIS — B370 Candidal stomatitis: Secondary | ICD-10-CM | POA: Insufficient documentation

## 2019-02-17 ENCOUNTER — Other Ambulatory Visit: Payer: Self-pay | Admitting: Infectious Disease

## 2019-02-17 ENCOUNTER — Other Ambulatory Visit: Payer: Self-pay

## 2019-02-17 DIAGNOSIS — B20 Human immunodeficiency virus [HIV] disease: Secondary | ICD-10-CM

## 2019-02-17 DIAGNOSIS — L02221 Furuncle of abdominal wall: Secondary | ICD-10-CM | POA: Diagnosis not present

## 2019-02-17 MED ORDER — ZIDOVUDINE 300 MG PO TABS: ORAL_TABLET | ORAL | 5 refills | Status: DC

## 2019-02-17 MED ORDER — BIKTARVY 50-200-25 MG PO TABS: 1.0000 | ORAL_TABLET | Freq: Every day | ORAL | 5 refills | Status: DC

## 2019-02-18 ENCOUNTER — Other Ambulatory Visit: Payer: Self-pay | Admitting: *Deleted

## 2019-02-18 NOTE — Telephone Encounter (Signed)
Next appt scheduled 10/28 in [facility].

## 2019-02-19 ENCOUNTER — Non-Acute Institutional Stay (SKILLED_NURSING_FACILITY): Payer: Medicare Other | Admitting: Adult Health

## 2019-02-19 ENCOUNTER — Other Ambulatory Visit: Payer: Self-pay | Admitting: *Deleted

## 2019-02-19 ENCOUNTER — Encounter: Payer: Self-pay | Admitting: Adult Health

## 2019-02-19 DIAGNOSIS — K219 Gastro-esophageal reflux disease without esophagitis: Secondary | ICD-10-CM | POA: Diagnosis not present

## 2019-02-19 DIAGNOSIS — E1151 Type 2 diabetes mellitus with diabetic peripheral angiopathy without gangrene: Secondary | ICD-10-CM | POA: Insufficient documentation

## 2019-02-19 DIAGNOSIS — E1142 Type 2 diabetes mellitus with diabetic polyneuropathy: Secondary | ICD-10-CM

## 2019-02-19 DIAGNOSIS — R110 Nausea: Secondary | ICD-10-CM

## 2019-02-19 DIAGNOSIS — E1169 Type 2 diabetes mellitus with other specified complication: Secondary | ICD-10-CM | POA: Insufficient documentation

## 2019-02-19 DIAGNOSIS — I10 Essential (primary) hypertension: Secondary | ICD-10-CM | POA: Diagnosis not present

## 2019-02-19 DIAGNOSIS — B20 Human immunodeficiency virus [HIV] disease: Secondary | ICD-10-CM | POA: Diagnosis not present

## 2019-02-19 DIAGNOSIS — R64 Cachexia: Secondary | ICD-10-CM

## 2019-02-19 DIAGNOSIS — B370 Candidal stomatitis: Secondary | ICD-10-CM | POA: Diagnosis not present

## 2019-02-19 DIAGNOSIS — I739 Peripheral vascular disease, unspecified: Secondary | ICD-10-CM | POA: Diagnosis not present

## 2019-02-19 DIAGNOSIS — E7800 Pure hypercholesterolemia, unspecified: Secondary | ICD-10-CM | POA: Diagnosis not present

## 2019-02-19 DIAGNOSIS — J189 Pneumonia, unspecified organism: Secondary | ICD-10-CM | POA: Diagnosis not present

## 2019-02-19 DIAGNOSIS — Z794 Long term (current) use of insulin: Secondary | ICD-10-CM

## 2019-02-19 MED ORDER — TOUJEO MAX SOLOSTAR 300 UNIT/ML ~~LOC~~ SOPN: 35.0000 [IU] | PEN_INJECTOR | Freq: Every day | SUBCUTANEOUS | 3 refills | Status: DC

## 2019-02-19 MED ORDER — VICTOZA 18 MG/3ML ~~LOC~~ SOPN: PEN_INJECTOR | SUBCUTANEOUS | 6 refills | Status: DC

## 2019-02-19 MED ORDER — CLOPIDOGREL BISULFATE 75 MG PO TABS: 75.0000 mg | ORAL_TABLET | Freq: Every day | ORAL | 3 refills | Status: DC

## 2019-02-19 MED ORDER — BIKTARVY 50-200-25 MG PO TABS: 1.0000 | ORAL_TABLET | Freq: Every day | ORAL | 5 refills | Status: DC

## 2019-02-19 MED ORDER — ONDANSETRON HCL 4 MG PO TABS: 4.0000 mg | ORAL_TABLET | Freq: Four times a day (QID) | ORAL | 0 refills | Status: DC | PRN

## 2019-02-19 MED ORDER — PIOGLITAZONE HCL 30 MG PO TABS: 30.0000 mg | ORAL_TABLET | Freq: Every day | ORAL | 0 refills | Status: DC

## 2019-02-19 MED ORDER — ATORVASTATIN CALCIUM 40 MG PO TABS: ORAL_TABLET | ORAL | 1 refills | Status: DC

## 2019-02-19 MED ORDER — INSULIN LISPRO JUNIOR KWIKPEN 100 UNIT/ML ~~LOC~~ SOPN: 12.0000 [IU] | PEN_INJECTOR | Freq: Three times a day (TID) | SUBCUTANEOUS | 0 refills | Status: DC

## 2019-02-19 MED ORDER — ZIDOVUDINE 300 MG PO TABS: ORAL_TABLET | ORAL | 5 refills | Status: DC

## 2019-02-19 MED ORDER — OMEPRAZOLE 40 MG PO CPDR: 40.0000 mg | DELAYED_RELEASE_CAPSULE | Freq: Every day | ORAL | 0 refills | Status: DC

## 2019-02-19 MED ORDER — LISINOPRIL 30 MG PO TABS: 30.0000 mg | ORAL_TABLET | Freq: Every day | ORAL | 3 refills | Status: DC

## 2019-02-19 MED ORDER — CLOTRIMAZOLE 10 MG MT TROC: 10.0000 mg | Freq: Every day | OROMUCOSAL | 0 refills | Status: AC

## 2019-02-19 NOTE — Progress Notes (Signed)
Location:  [facility] Room Number: 211/A Place of Service:  SNF (31) Provider:  Durenda Age, DNP, FNP-BC  Patient Care Team: Welford Roche, MD as PCP - General (Internal Medicine) Tommy Medal, Lavell Islam, MD as PCP - Infectious Diseases (Infectious Diseases) Plyler, Chauncey Reading, RD as Dietitian (Dietician) Milus Banister, MD as Attending Physician (Gastroenterology)  Extended Emergency Contact Information Primary Emergency Contact: Cecilio Asper, [place] [employer] Phone: [phone] Relation: Brother  Code Status:  DNR  Goals of care: Advanced Directive information Advanced Directives 02/19/2019  Does Patient Have a Medical Advance Directive? Yes  Type of Advance Directive Out of facility DNR (pink MOST or yellow form)  Does patient want to make changes to medical advance directive? -  Would patient like information on creating a medical advance directive? -  Pre-existing out of facility DNR order (yellow form or pink MOST form) Yellow form placed in chart (order not valid for inpatient use)     Chief Complaint  Patient presents with  . Discharge Note    Discharge Visit    HPI:  Pt is a 68 y.o. male who is for discharge home on 02/20/2019 with home health PT and OT.  He was admitted to [facility] on 01/15/19 from hospitalization 12/29/18 - 01/15/19 due to left lower lobe pneumonia complicated by left effusion and hypoxia.  He was found down in his apartment and brought to the hospital by EMS.  He had altered mental status in the context of hypoxemia.  BiPAP was required initially.  He was transitioned to high flow nasal oxygen but he desaturated at 15 L into the mid 80s.  Repeat chest x-ray 9/30 revealed a small to moderate sized left effusion.  Thoracentesis was performed under ultrasound guidance on 9/4.  Labs suggested that this was an exudative pleural fluid effusion.  Oxygen  requirement decreased following thoracentesis.  He received a 7-day course of ceftriaxone and azithromycin, 8/22 to 8/29.  On initial chest x-ray 8/23, a tiny nodular density was noted at the right base.  Serial monitor and CT work to be considered after full recovery and rehab at the SNF.  He has PMH of peripheral vascular disease, history of pneumocystis pneumonia, MS in remission, MRSA infection, essential hypertension, dyslipidemia, HIV, history of syphilis, chronic hepatitis C, GERD, colon polyps and depression.  Surgeries include right AKA, colonic polypectomy and PVD stenting.  Patient was admitted to this facility for short-term rehabilitation after the patient's recent hospitalization.  Patient has completed SNF rehabilitation and therapy has cleared the patient for discharge.  Short-term rehabilitation at [facility] was complicated by furuncles and oral candida.    Past Medical History:  Diagnosis Date  . Asthma    per 2003 [facility] pulm records pfts   . Blood dyscrasia    HIV  . Clotting disorder ([place])   . Colon polyps    noted previous colonoscopy [facility]  . DDD (degenerative disc disease)    cervical spine  . Depression   . Diabetes mellitus    dx 2010  . Fever    unknwon origin  . GERD (gastroesophageal reflux disease)   . Head swelling 05/23/2017  . Hep C w/o coma, chronic ([place])   . History of syphilis    noted [facility] records  . HIV infection ([place])    undetectable viral load and CD4 ct 667 as of 11/2011  .  Hyperlipidemia   . Hypertension   . MRSA (methicillin resistant Staphylococcus aureus)   . Multiple sclerosis ([place])    in remission as of 11/2011 (diagnosed late 1980s)  . Pain in limb-Right Leg 10/13/2013  . 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[place])    2002  . Pneumonia   . PVD (peripheral vascular disease) ([place])    Left Stent 07/02/2008  . Scalp lesion 07/26/2017  . UTI (urinary tract infection)   . Wears dentures    Past Surgical History:   Procedure Laterality Date  . ABDOMINAL AORTOGRAM W/LOWER EXTREMITY N/A 08/07/2017   Procedure: ABDOMINAL AORTOGRAM W/LOWER EXTREMITY;  Surgeon: Serafina Mitchell, MD;  Location: [facility];  Service: Cardiovascular;  Laterality: N/A;  . ABOVE KNEE LEG AMPUTATION     Right Leg  . COLONOSCOPY W/ BIOPSIES AND POLYPECTOMY    . LOWER EXTREMITY ANGIOGRAM Left 12/26/2011   Procedure: LOWER EXTREMITY ANGIOGRAM;  Surgeon: Serafina Mitchell, MD;  Location: [facility] CATH LAB;  Service: Cardiovascular;  Laterality: Left;  . LOWER EXTREMITY ANGIOGRAM Left 08/17/2017   Procedure: LOWER EXTREMITY ANGIOGRAM LEFT LEG WITH RUNOFF AND Stenting.;  Surgeon: Serafina Mitchell, MD;  Location: [facility];  Service: Vascular;  Laterality: Left;  Marland Kitchen MULTIPLE TOOTH EXTRACTIONS    . OTHER SURGICAL HISTORY     right lower ext AKA with prothesis   . OTHER SURGICAL HISTORY     left left with stents (Dr. Seward Speck)  . OTHER SURGICAL HISTORY     2003 colonoscopy 5 mm polyp transverse colon; (2)61m  polyps in rectum-hyperplastic  . PERIPHERAL VASCULAR INTERVENTION Left 08/17/2017   Procedure: POPLITEAL STENT;  Surgeon: BSerafina Mitchell MD;  Location: [facility]  Service: Vascular;  Laterality: Left;    Allergies  Allergen Reactions  . Sulfonamide Derivatives Hives    Outpatient Encounter Medications as of 02/19/2019  Medication Sig  . atorvastatin (LIPITOR) 40 MG tablet TAKE 1 TABLET(40 MG) BY MOUTH DAILY  . bictegravir-emtricitabine-tenofovir AF (BIKTARVY) 50-200-25 MG TABS tablet Take 1 tablet by mouth daily.  . bisacodyl (DULCOLAX) 10 MG suppository If not relieved by MOM, give 10 mg Bisacodyl suppositiory rectally X 1 dose in 24 hours as needed (Do not use constipation standing orders for residents with renal failure/CFR less than 30. Contact MD for orders) (Physician Order)  . clopidogrel (PLAVIX) 75 MG tablet Take 1 tablet (75 mg total) by mouth daily.  . clotrimazole (MYCELEX) 10 MG troche Take 1 tablet (10 mg total)  by mouth 5 (five) times daily for 13 days.  . Insulin Glargine, 2 Unit Dial, (TOUJEO MAX SOLOSTAR) 300 UNIT/ML SOPN Inject 35 Units into the skin at bedtime.  . Insulin Lispro Junior KwikPen 100 UNIT/ML SOPN Inject 12 Units into the skin 3 (three) times daily. For DM  . liraglutide (VICTOZA) 18 MG/3ML SOPN ADMINISTER 1.8 MG UNDER THE SKIN EVERY MORNING  . lisinopril (ZESTRIL) 30 MG tablet Take 1 tablet (30 mg total) by mouth daily. Hold if systolic BP <<269 . magnesium hydroxide (MILK OF MAGNESIA) 400 MG/5ML suspension If no BM in 3 days, give 30 cc Milk of Magnesium p.o. x 1 dose in 24 hours as needed (Do not use standing constipation orders for residents with renal failure CFR less than 30. Contact MD for orders) (Physician Order)  . Nutritional Supplement LIQD Take 1 each by mouth 2 (two) times daily. Health Shakes (prefers vanilla or strawberry)  . omeprazole (PRILOSEC) 40 MG capsule Take 1 capsule (40 mg total) by  mouth daily.  . ondansetron (ZOFRAN) 4 MG tablet Take 1 tablet (4 mg total) by mouth every 6 (six) hours as needed for nausea or vomiting.  . OXYGEN Inhale 3 L/min into the lungs continuous.  . pioglitazone (ACTOS) 30 MG tablet Take 1 tablet (30 mg total) by mouth daily.  . Sodium Phosphates (RA SALINE ENEMA RE) If not relieved by Biscodyl suppository, give disposable Saline Enema rectally X 1 dose/24 hrs as needed (Do not use constipation standing orders for residents with renal failure/CFR less than 30. Contact MD for orders)(Physician Or  . zidovudine (RETROVIR) 300 MG tablet TAKE 1 TABLET(300 MG) BY MOUTH TWICE DAILY  . [DISCONTINUED] atorvastatin (LIPITOR) 40 MG tablet TAKE 1 TABLET(40 MG) BY MOUTH DAILY  . [DISCONTINUED] bictegravir-emtricitabine-tenofovir AF (BIKTARVY) 50-200-25 MG TABS tablet Take 1 tablet by mouth daily.  . [DISCONTINUED] clopidogrel (PLAVIX) 75 MG tablet Take 1 tablet (75 mg total) by mouth daily.  . [DISCONTINUED] clotrimazole (MYCELEX) 10 MG troche Take 10  mg by mouth 5 (five) times daily.  . [DISCONTINUED] Insulin Glargine, 2 Unit Dial, (TOUJEO MAX SOLOSTAR) 300 UNIT/ML SOPN Inject 35 Units into the skin at bedtime.  . [DISCONTINUED] Insulin Lispro Junior KwikPen 100 UNIT/ML SOPN Inject 12 Units into the skin 3 (three) times daily. For DM  . [DISCONTINUED] liraglutide (VICTOZA) 18 MG/3ML SOPN ADMINISTER 1.8 MG UNDER THE SKIN EVERY MORNING  . [DISCONTINUED] lisinopril (ZESTRIL) 30 MG tablet Take 1 tablet (30 mg total) by mouth daily. Hold if systolic BP <354  . [DISCONTINUED] omeprazole (PRILOSEC) 40 MG capsule TAKE ONE CAPSULE BY MOUTH EVERY DAY  . [DISCONTINUED] ondansetron (ZOFRAN) 4 MG tablet Take 4 mg by mouth every 6 (six) hours as needed for nausea or vomiting.  . [DISCONTINUED] pioglitazone (ACTOS) 30 MG tablet Take 1 tablet (30 mg total) by mouth daily.  . [DISCONTINUED] zidovudine (RETROVIR) 300 MG tablet TAKE 1 TABLET(300 MG) BY MOUTH TWICE DAILY  . Blood Glucose Monitoring Suppl (BAYER CONTOUR MONITOR) W/DEVICE KIT Use to check blood sugar as instructed up to 4 times a day (Patient not taking: Reported on 02/06/2019)  . glucose blood (CONTOUR NEXT TEST) test strip 1 each by Other route 4 (four) times daily. Use 4 times daily to check blood sugar. diag code E11.42. (Patient not taking: Reported on 02/06/2019)  . Insulin Pen Needle (BD PEN NEEDLE NANO U/F) 32G X 4 MM MISC USE AS DIRECTED FOUR TIMES DAILY. Dx code  E11.42 (Patient not taking: Reported on 01/16/2019)  . Lancet Devices (BAYER MICROLET 2 LANCING DEVIC) MISC Use to check blood sugar up to 3 ties a day (Patient not taking: Reported on 01/16/2019)  . [DISCONTINUED] NON FORMULARY ADD NSA MED PASS 120 ML TIWICE A DAY   No facility-administered encounter medications on file as of 02/19/2019.     Review of Systems  GENERAL: No change in appetite, no fatigue, no weight changes, no fever, chills or weakness MOUTH and THROAT: Denies oral discomfort, gingival pain or bleeding RESPIRATORY:  no cough, SOB, DOE, wheezing, hemoptysis CARDIAC: No chest pain, edema or palpitations GI: No abdominal pain, diarrhea, constipation, heart burn, nausea or vomiting GU: Denies dysuria, frequency, hematuria, incontinence, or discharge NEUROLOGICAL: Denies dizziness, syncope, numbness, or headache PSYCHIATRIC: Denies feelings of depression or anxiety. No report of hallucinations, insomnia, paranoia, or agitation    Immunization History  Administered Date(s) Administered  . Influenza Split 04/12/2011, 01/18/2012  . Influenza Whole 02/12/2006, 02/06/2007, 02/13/2008, 02/25/2009, 03/15/2010  . Influenza,inj,Quad PF,6+ Mos 02/03/2013, 02/13/2014, 01/06/2016,  01/25/2017, 01/31/2018  . Influenza-Unspecified 03/10/2015, 02/11/2019  . Pneumococcal Conjugate-13 10/07/2015  . Pneumococcal Polysaccharide-23 02/12/2006, 02/25/2009, 03/19/2014   Pertinent  Health Maintenance Due  Topic Date Due  . LIPID PANEL  01/17/2019  . OPHTHALMOLOGY EXAM  05/09/2019 (Originally 10/26/2018)  . FOOT EXAM  03/16/2019  . HEMOGLOBIN A1C  03/31/2019  . COLONOSCOPY  05/15/2021  . INFLUENZA VACCINE  Completed  . PNA vac Low Risk Adult  Completed   Fall Risk  12/09/2018 12/04/2018 06/14/2018 03/15/2018 01/31/2018  Falls in the past year? 1 1 0 1 No  Number falls in past yr: 1 1 - 1 -  Comment - - - - -  Injury with Fall? 0 0 - 0 -  Comment - - - - -  Risk Factor Category  - - - - -  Comment - - - - -  Risk for fall due to : - Impaired balance/gait;Impaired mobility - Impaired balance/gait;Impaired mobility;History of fall(s) -  Risk for fall due to: Comment - - - - -  Follow up - Falls prevention discussed - - -     Vitals:   02/19/19 1539  BP: 121/86  Pulse: 74  Resp: 18  Temp: (!) 96.7 F (35.9 C)  TempSrc: Oral  SpO2: 92%  Weight: 151 lb 9.6 oz (68.8 kg)  Height: _0  (1.854 m)   Body mass index is 20 kg/m.  Physical Exam  GENERAL APPEARANCE: Well nourished. In no acute distress. Normal body  habitus SKIN:  Skin is warm and dry.  MOUTH and THROAT: Lips are without lesions. Oral mucosa is moist and without lesions. Tongue is normal in shape, size, and color and without lesions. Dentures, top and bottom RESPIRATORY: Breathing is even & unlabored, BS CTAB CARDIAC: RRR, no murmur,no extra heart sounds, no edema GI: Abdomen soft, normal BS, no masses, no tenderness EXTREMITIES:  Able to move X 4 extremities. Right AKA NEUROLOGICAL: There is no tremor. Speech is clear. Alert and oriented X 3. PSYCHIATRIC:  Affect and behavior are appropriate  Labs reviewed: Recent Labs    01/06/19 0651 01/11/19 0420 01/12/19 0628  NA 134* 134* 137  K 3.8 3.9 4.0  CL 101 100 103  CO2 _1 GLUCOSE 197* 193* 179*  BUN _2 CREATININE 0.58* 0.53* 0.54*  CALCIUM 8.4* 8.4* 8.6*   Recent Labs    12/29/18 1055 12/30/18 0530 01/02/19 0455 01/11/19 0420  AST 229* 170* 206*  --   ALT 72* 71* 75*  --   ALKPHOS 55 41 53  --   BILITOT 2.1* 1.1 0.9  --   PROT 7.7 6.1* 5.9* 5.8*  ALBUMIN 3.1* 2.3* 1.7*  --    Recent Labs    11/30/18 1406 12/29/18 1055  01/02/19 0544  01/10/19 0355 01/11/19 0420 01/12/19 0628  WBC 3.2* 9.5   < > 2.9*   < > 4.7 5.0 5.8  NEUTROABS 1.5* 7.6  --  1.8  --   --   --   --   HGB 11.3* 12.8*   < > 9.3*   < > 9.0* 8.9* 8.7*  HCT 33.2* 37.6*   < > 27.9*   < > 26.4* 25.9* 26.3*  MCV 116.1* 117.5*   < > 116.7*   < > 115.3* 115.6* 117.9*  PLT 141* 165   < > 142*   < > 288 282 303   < > = values in this interval not displayed.   Lab  Results  Component Value Date   TSH 1.189 01/02/2019   Lab Results  Component Value Date   HGBA1C 6.8 (H) 12/29/2018   Lab Results  Component Value Date   CHOL 133 01/16/2018   HDL 39 (L) 01/16/2018   LDLCALC 71 01/16/2018   TRIG 142 01/16/2018   CHOLHDL 3.4 01/16/2018    Assessment/Plan  1.  Community acquired pneumonia of left lower lobe of lung - completed ceftriaxone and azithromycin 8/22 -8/29, resolved  2.  PERIPHERAL VASCULAR DISEASE - clopidogrel (PLAVIX) 75 MG tablet; Take 1 tablet (75 mg total) by mouth daily.  Dispense: 90 tablet; Refill: 3  3. Benign essential HTN - lisinopril (ZESTRIL) 30 MG tablet; Take 1 tablet (30 mg total) by mouth daily. Hold if systolic BP <292  Dispense: 90 tablet; Refill: 3  4. Human immunodeficiency virus I infection ([place]) - bictegravir-emtricitabine-tenofovir AF (BIKTARVY) 50-200-25 MG TABS tablet; Take 1 tablet by mouth daily.  Dispense: 30 tablet; Refill: 5 - zidovudine (RETROVIR) 300 MG tablet; TAKE 1 TABLET(300 MG) BY MOUTH TWICE DAILY  Dispense: 60 tablet; Refill: 5 - follow up with Infectious Disease, Dr Tommy Medal  5. Hypercholesteremia Lab Results  Component Value Date   CHOL 133 01/16/2018   HDL 39 (L) 01/16/2018   LDLCALC 71 01/16/2018   TRIG 142 01/16/2018   CHOLHDL 3.4 01/16/2018   - atorvastatin (LIPITOR) 40 MG tablet; TAKE 1 TABLET(40 MG) BY MOUTH DAILY  Dispense: 90 tablet; Refill: 1  6. Oral candida - clotrimazole (MYCELEX) 10 MG troche; Take 1 tablet (10 mg total) by mouth 5 (five) times daily for 13 days.  Dispense: 65 tablet; Refill: 0  7. Type 2 diabetes mellitus with other specified complication, with long-term current use of insulin (HCC) Lab Results  Component Value Date   HGBA1C 6.8 (H) 12/29/2018   - Insulin Glargine, 2 Unit Dial, (TOUJEO MAX SOLOSTAR) 300 UNIT/ML SOPN; Inject 35 Units into the skin at bedtime.  Dispense: 5 pen; Refill: 3 - Insulin Lispro Junior KwikPen 100 UNIT/ML SOPN; Inject 12 Units into the skin 3 (three) times daily. For DM  Dispense: 3 mL; Refill: 0 - liraglutide (VICTOZA) 18 MG/3ML SOPN; ADMINISTER 1.8 MG UNDER THE SKIN EVERY MORNING  Dispense: 9 mL; Refill: 6 - pioglitazone (ACTOS) 30 MG tablet; Take 1 tablet (30 mg total) by mouth daily.  Dispense: 90 tablet; Refill: 0  8. Gastroesophageal reflux disease without esophagitis - omeprazole (PRILOSEC) 40 MG capsule; Take 1 capsule (40 mg total) by mouth  daily.  Dispense: 90 capsule; Refill: 0  9. Nausea - ondansetron (ZOFRAN) 4 MG tablet; Take 1 tablet (4 mg total) by mouth every 6 (six) hours as needed for nausea or vomiting.  Dispense: 20 tablet; Refill: 0    I have filled out patient's discharge paperwork and e-prescribed medications. Patient will have home health PT and OT.  DME provided: Rollator walker, 3 in 1 bedside commode  Total discharge time: Greater than 30 minutes Greater than 50% was spent in counseling and coordination of care.   Discharge time involved coordination of the discharge process with social worker, nursing staff and therapy department. Medical justification for home health services/DME verified.       Durenda Age, DNP, FNP-BC [facility] [phone] (Monday-Friday 8:00 a.m. - 5:00 p.m.) [phone] (after hours)

## 2019-02-21 ENCOUNTER — Other Ambulatory Visit: Payer: Self-pay

## 2019-02-21 DIAGNOSIS — Z113 Encounter for screening for infections with a predominantly sexual mode of transmission: Secondary | ICD-10-CM

## 2019-02-21 DIAGNOSIS — B20 Human immunodeficiency virus [HIV] disease: Secondary | ICD-10-CM

## 2019-02-24 ENCOUNTER — Other Ambulatory Visit: Payer: Medicare Other

## 2019-02-24 ENCOUNTER — Telehealth: Payer: Self-pay | Admitting: *Deleted

## 2019-02-24 DIAGNOSIS — M5136 Other intervertebral disc degeneration, lumbar region: Secondary | ICD-10-CM | POA: Diagnosis not present

## 2019-02-24 DIAGNOSIS — B20 Human immunodeficiency virus [HIV] disease: Secondary | ICD-10-CM | POA: Diagnosis not present

## 2019-02-24 DIAGNOSIS — L02224 Furuncle of groin: Secondary | ICD-10-CM | POA: Diagnosis not present

## 2019-02-24 DIAGNOSIS — E1151 Type 2 diabetes mellitus with diabetic peripheral angiopathy without gangrene: Secondary | ICD-10-CM | POA: Diagnosis not present

## 2019-02-24 DIAGNOSIS — M6282 Rhabdomyolysis: Secondary | ICD-10-CM | POA: Diagnosis not present

## 2019-02-24 DIAGNOSIS — G35 Multiple sclerosis: Secondary | ICD-10-CM | POA: Diagnosis not present

## 2019-02-24 DIAGNOSIS — K219 Gastro-esophageal reflux disease without esophagitis: Secondary | ICD-10-CM | POA: Diagnosis not present

## 2019-02-24 DIAGNOSIS — Z955 Presence of coronary angioplasty implant and graft: Secondary | ICD-10-CM | POA: Diagnosis not present

## 2019-02-24 DIAGNOSIS — Z792 Long term (current) use of antibiotics: Secondary | ICD-10-CM | POA: Diagnosis not present

## 2019-02-24 DIAGNOSIS — E1169 Type 2 diabetes mellitus with other specified complication: Secondary | ICD-10-CM | POA: Diagnosis not present

## 2019-02-24 DIAGNOSIS — Z7902 Long term (current) use of antithrombotics/antiplatelets: Secondary | ICD-10-CM | POA: Diagnosis not present

## 2019-02-24 DIAGNOSIS — I10 Essential (primary) hypertension: Secondary | ICD-10-CM | POA: Diagnosis not present

## 2019-02-24 DIAGNOSIS — E1142 Type 2 diabetes mellitus with diabetic polyneuropathy: Secondary | ICD-10-CM | POA: Diagnosis not present

## 2019-02-24 DIAGNOSIS — I70219 Atherosclerosis of native arteries of extremities with intermittent claudication, unspecified extremity: Secondary | ICD-10-CM | POA: Diagnosis not present

## 2019-02-24 DIAGNOSIS — Z89611 Acquired absence of right leg above knee: Secondary | ICD-10-CM | POA: Diagnosis not present

## 2019-02-24 DIAGNOSIS — B370 Candidal stomatitis: Secondary | ICD-10-CM | POA: Diagnosis not present

## 2019-02-24 DIAGNOSIS — Z8701 Personal history of pneumonia (recurrent): Secondary | ICD-10-CM | POA: Diagnosis not present

## 2019-02-24 DIAGNOSIS — J45909 Unspecified asthma, uncomplicated: Secondary | ICD-10-CM | POA: Diagnosis not present

## 2019-02-24 DIAGNOSIS — F329 Major depressive disorder, single episode, unspecified: Secondary | ICD-10-CM | POA: Diagnosis not present

## 2019-02-24 DIAGNOSIS — D689 Coagulation defect, unspecified: Secondary | ICD-10-CM | POA: Diagnosis not present

## 2019-02-24 DIAGNOSIS — Z794 Long term (current) use of insulin: Secondary | ICD-10-CM | POA: Diagnosis not present

## 2019-02-24 DIAGNOSIS — B182 Chronic viral hepatitis C: Secondary | ICD-10-CM | POA: Diagnosis not present

## 2019-02-24 DIAGNOSIS — G547 Phantom limb syndrome without pain: Secondary | ICD-10-CM | POA: Diagnosis not present

## 2019-02-24 NOTE — Telephone Encounter (Signed)
Agree. Thanks

## 2019-02-24 NOTE — Telephone Encounter (Signed)
[facility] PT calls for VO: 2x week for 4 weeks 1x week for 2 weeks Gait training, balance, strengthening, safety Do you agree

## 2019-02-28 ENCOUNTER — Other Ambulatory Visit: Payer: Self-pay

## 2019-02-28 ENCOUNTER — Other Ambulatory Visit: Payer: Medicare Other

## 2019-02-28 DIAGNOSIS — Z113 Encounter for screening for infections with a predominantly sexual mode of transmission: Secondary | ICD-10-CM

## 2019-02-28 DIAGNOSIS — L02224 Furuncle of groin: Secondary | ICD-10-CM | POA: Diagnosis not present

## 2019-02-28 DIAGNOSIS — M6282 Rhabdomyolysis: Secondary | ICD-10-CM | POA: Diagnosis not present

## 2019-02-28 DIAGNOSIS — B20 Human immunodeficiency virus [HIV] disease: Secondary | ICD-10-CM | POA: Diagnosis not present

## 2019-02-28 DIAGNOSIS — E1142 Type 2 diabetes mellitus with diabetic polyneuropathy: Secondary | ICD-10-CM | POA: Diagnosis not present

## 2019-02-28 DIAGNOSIS — E1151 Type 2 diabetes mellitus with diabetic peripheral angiopathy without gangrene: Secondary | ICD-10-CM | POA: Diagnosis not present

## 2019-02-28 DIAGNOSIS — I70219 Atherosclerosis of native arteries of extremities with intermittent claudication, unspecified extremity: Secondary | ICD-10-CM | POA: Diagnosis not present

## 2019-02-28 DIAGNOSIS — E1169 Type 2 diabetes mellitus with other specified complication: Secondary | ICD-10-CM | POA: Diagnosis not present

## 2019-02-28 LAB — T-HELPER CELL (CD4) - (RCID CLINIC ONLY)
CD4 % Helper T Cell: 19 % — ABNORMAL LOW (ref 33–65)
CD4 T Cell Abs: 502 /uL (ref 400–1790)

## 2019-03-03 ENCOUNTER — Telehealth: Payer: Self-pay

## 2019-03-03 NOTE — Telephone Encounter (Signed)
-----   Message from Truman Hayward, MD sent at 03/03/2019  3:43 PM EDT ----- Abe People needs IM PCN 2.4 MU x one for his syphilis

## 2019-03-03 NOTE — Telephone Encounter (Signed)
Attempted to call patient in regarding to positive RPR. Per MD patient will need to schedule an appointment for Bicillin 2.4 million units x1. Unable to reach patient at this time; left voicemail requesting patient call office back to review labs.Did not disclose any lab information in message. [place]

## 2019-03-04 ENCOUNTER — Other Ambulatory Visit: Payer: Self-pay

## 2019-03-04 ENCOUNTER — Ambulatory Visit (INDEPENDENT_AMBULATORY_CARE_PROVIDER_SITE_OTHER): Payer: Medicare Other | Admitting: *Deleted

## 2019-03-04 DIAGNOSIS — I70219 Atherosclerosis of native arteries of extremities with intermittent claudication, unspecified extremity: Secondary | ICD-10-CM | POA: Diagnosis not present

## 2019-03-04 DIAGNOSIS — A539 Syphilis, unspecified: Secondary | ICD-10-CM | POA: Diagnosis not present

## 2019-03-04 DIAGNOSIS — E1151 Type 2 diabetes mellitus with diabetic peripheral angiopathy without gangrene: Secondary | ICD-10-CM | POA: Diagnosis not present

## 2019-03-04 DIAGNOSIS — E1142 Type 2 diabetes mellitus with diabetic polyneuropathy: Secondary | ICD-10-CM | POA: Diagnosis not present

## 2019-03-04 DIAGNOSIS — M6282 Rhabdomyolysis: Secondary | ICD-10-CM | POA: Diagnosis not present

## 2019-03-04 DIAGNOSIS — L02224 Furuncle of groin: Secondary | ICD-10-CM | POA: Diagnosis not present

## 2019-03-04 DIAGNOSIS — E1169 Type 2 diabetes mellitus with other specified complication: Secondary | ICD-10-CM | POA: Diagnosis not present

## 2019-03-04 MED ORDER — PENICILLIN G BENZATHINE 1200000 UNIT/2ML IM SUSP
1.2000 10*6.[IU] | Freq: Once | INTRAMUSCULAR | Status: AC
  Administered 2019-03-04: 1.2 10*6.[IU] via INTRAMUSCULAR

## 2019-03-05 ENCOUNTER — Ambulatory Visit (INDEPENDENT_AMBULATORY_CARE_PROVIDER_SITE_OTHER): Payer: Medicare Other | Admitting: Internal Medicine

## 2019-03-05 ENCOUNTER — Encounter: Payer: Self-pay | Admitting: Internal Medicine

## 2019-03-05 VITALS — BP 156/62 | HR 81 | Wt 171.0 lb

## 2019-03-05 DIAGNOSIS — Z794 Long term (current) use of insulin: Secondary | ICD-10-CM | POA: Diagnosis not present

## 2019-03-05 DIAGNOSIS — Z89611 Acquired absence of right leg above knee: Secondary | ICD-10-CM | POA: Diagnosis not present

## 2019-03-05 DIAGNOSIS — I779 Disorder of arteries and arterioles, unspecified: Secondary | ICD-10-CM | POA: Diagnosis not present

## 2019-03-05 DIAGNOSIS — L0292 Furuncle, unspecified: Secondary | ICD-10-CM | POA: Diagnosis not present

## 2019-03-05 DIAGNOSIS — E1142 Type 2 diabetes mellitus with diabetic polyneuropathy: Secondary | ICD-10-CM

## 2019-03-05 DIAGNOSIS — E1169 Type 2 diabetes mellitus with other specified complication: Secondary | ICD-10-CM | POA: Diagnosis not present

## 2019-03-05 LAB — CBC WITH DIFFERENTIAL/PLATELET
Absolute Monocytes: 323 cells/uL (ref 200–950)
Basophils Absolute: 21 cells/uL (ref 0–200)
Basophils Relative: 0.5 %
Eosinophils Absolute: 164 cells/uL (ref 15–500)
Eosinophils Relative: 3.9 %
HCT: 31.6 % — ABNORMAL LOW (ref 38.5–50.0)
Hemoglobin: 10.9 g/dL — ABNORMAL LOW (ref 13.2–17.1)
Lymphs Abs: 2684 cells/uL (ref 850–3900)
MCH: 41.1 pg — ABNORMAL HIGH (ref 27.0–33.0)
MCHC: 34.5 g/dL (ref 32.0–36.0)
MCV: 119.2 fL — ABNORMAL HIGH (ref 80.0–100.0)
MPV: 8.7 fL (ref 7.5–12.5)
Monocytes Relative: 7.7 %
Neutro Abs: 1008 cells/uL — ABNORMAL LOW (ref 1500–7800)
Neutrophils Relative %: 24 %
Platelets: 241 10*3/uL (ref 140–400)
RBC: 2.65 10*6/uL — ABNORMAL LOW (ref 4.20–5.80)
RDW: 14.5 % (ref 11.0–15.0)
Total Lymphocyte: 63.9 %
WBC: 4.2 10*3/uL (ref 3.8–10.8)

## 2019-03-05 LAB — COMPREHENSIVE METABOLIC PANEL
AG Ratio: 1.3 (calc) (ref 1.0–2.5)
ALT: 14 U/L (ref 9–46)
AST: 16 U/L (ref 10–35)
Albumin: 3.7 g/dL (ref 3.6–5.1)
Alkaline phosphatase (APISO): 52 U/L (ref 35–144)
BUN/Creatinine Ratio: 6 (calc) (ref 6–22)
BUN: 5 mg/dL — ABNORMAL LOW (ref 7–25)
CO2: 25 mmol/L (ref 20–32)
Calcium: 9.1 mg/dL (ref 8.6–10.3)
Chloride: 107 mmol/L (ref 98–110)
Creat: 0.78 mg/dL (ref 0.70–1.18)
Globulin: 2.9 g/dL (calc) (ref 1.9–3.7)
Glucose, Bld: 238 mg/dL — ABNORMAL HIGH (ref 65–99)
Potassium: 4.2 mmol/L (ref 3.5–5.3)
Sodium: 140 mmol/L (ref 135–146)
Total Bilirubin: 0.6 mg/dL (ref 0.2–1.2)
Total Protein: 6.6 g/dL (ref 6.1–8.1)

## 2019-03-05 LAB — RPR TITER: RPR Titer: 1:16 {titer} — ABNORMAL HIGH

## 2019-03-05 LAB — HIV-1 RNA QUANT-NO REFLEX-BLD
HIV 1 RNA Quant: <20 copies/mL
HIV-1 RNA Quant, Log: <1.3 Log copies/mL

## 2019-03-05 LAB — RPR: RPR Ser Ql: REACTIVE — AB

## 2019-03-05 LAB — FLUORESCENT TREPONEMAL AB(FTA)-IGG-BLD: Fluorescent Treponemal ABS: REACTIVE — AB

## 2019-03-05 MED ORDER — DOXYCYCLINE HYCLATE 100 MG PO CAPS: 100.0000 mg | ORAL_CAPSULE | Freq: Two times a day (BID) | ORAL | 0 refills | Status: DC

## 2019-03-05 MED ORDER — TOUJEO MAX SOLOSTAR 300 UNIT/ML ~~LOC~~ SOPN: 70.0000 [IU] | PEN_INJECTOR | Freq: Every day | SUBCUTANEOUS | 3 refills | Status: DC

## 2019-03-05 MED ORDER — BD PEN NEEDLE NANO U/F 32G X 4 MM MISC: 5 refills | Status: DC

## 2019-03-05 MED ORDER — VICTOZA 18 MG/3ML ~~LOC~~ SOPN: PEN_INJECTOR | SUBCUTANEOUS | 6 refills | Status: DC

## 2019-03-05 MED ORDER — INSULIN LISPRO JUNIOR KWIKPEN 100 UNIT/ML ~~LOC~~ SOPN: 24.0000 [IU] | PEN_INJECTOR | Freq: Three times a day (TID) | SUBCUTANEOUS | 3 refills | Status: DC

## 2019-03-05 MED ORDER — PIOGLITAZONE HCL 30 MG PO TABS: 30.0000 mg | ORAL_TABLET | Freq: Every day | ORAL | 3 refills | Status: DC

## 2019-03-05 NOTE — Assessment & Plan Note (Signed)
Patient was recently admitted to the hospital and then discharged to skilled nursing facility. At that time his insulin was changed as follows; Glargine 35 units QD, Lispro 12 units TID WC, and he was continued on the Pioglitizone 30 mg QD. Since returning home and resuming a normal diet he is noticed persistent hyperglycemia. He is therefore reverted to his old insulin regimen which is Glargine 70 units QD and Lispro 24 units TID WC. Since doing so he has noticed improvement in his CBG's. He was previously well controlled on this regiment. He is currently out of his pioglitizone.   A/P: - Continue Glargine 70 units QD and Lispro 24 units TID WC - Continue Pioglitazone 30 mg QD  - Continue Lisinopril 30 mg QD - Continue Atorvastatin 40 mg QD

## 2019-03-05 NOTE — Assessment & Plan Note (Addendum)
While at the skilled nursing facility the patient developed a "boil" in his right suprapubic region. He states that there was purulent discharge and they prescribed him 10 days of an antibiotic. The drainage initially improved however since stopping the antibiotic and has returned. On physical exam there is a fluctuating lesion in the right suprapubic region with purulent discharge and surrounding erythema. He denies systemic signs/symptoms of infections.   A/P: - Consistent with Furuncle  - Start Doxycycline 100 mg BID for 7 days

## 2019-03-05 NOTE — Assessment & Plan Note (Addendum)
Patient is status post above the knee amputation on the right. He uses a prosthetic to ambulate. He states that he needs to be evaluated for additional prosthetic supplies. The rubber soul on his prosthetic is wearing down. He previously got his supplies from [employer].   A/P: - DME order for Prosthetic supplies and liners

## 2019-03-05 NOTE — Patient Instructions (Signed)
Thank you for allowing [place] to provide your care. I have changed around your insulin prescriptions and sent out refills for all your diabetes medications. We have sent out a prescription for prosthetic supplies. I have sent out a prescription for doxycycline 100 mg. You'll take this twice a day for the next seven days. If the drainage does not improve please call to let us know.  Please come back to see her primary care doctor, Dr. Frederico Hamman, in approximately three months or sooner if any issues arise.

## 2019-03-05 NOTE — Progress Notes (Signed)
   CC: DM, Right above the knee amputation, and furuncle   HPI:  [name] is a 68 y.o. male with PMHx listed below presenting for DM, Right above the knee amputation, and furuncle. Please see the A&P for the status of the patient's chronic medical problems.  Past Medical History:  Diagnosis Date  . Asthma    per 2003 [facility] pulm records pfts   . Blood dyscrasia    HIV  . Clotting disorder ([place])   . Colon polyps    noted previous colonoscopy [facility]  . DDD (degenerative disc disease)    cervical spine  . Depression   . Diabetes mellitus    dx 2010  . Fever    unknwon origin  . GERD (gastroesophageal reflux disease)   . Head swelling 05/23/2017  . Hep C w/o coma, chronic ([place])   . History of syphilis    noted [facility] records  . HIV infection ([place])    undetectable viral load and CD4 ct 667 as of 11/2011  . Hyperlipidemia   . Hypertension   . MRSA (methicillin resistant Staphylococcus aureus)   . Multiple sclerosis ([place])    in remission as of 11/2011 (diagnosed late 1980s)  . Pain in limb-Right Leg 10/13/2013  . 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 Pneumonia   . PVD (peripheral vascular disease) ([place])    Left Stent 07/02/2008  . Scalp lesion 07/26/2017  . UTI (urinary tract infection)   . Wears dentures    Review of Systems:  Performed and all others negative.  Physical Exam: Vitals:   03/05/19 0950  BP: (!) 156/62  Pulse: 81  SpO2: 100%  Weight: 171 lb (77.6 kg)   General: Well nourished male in no acute distress Pulm: Good air movement with no wheezing or crackles  CV: RRR, no murmurs, no rubs  Abdomen: fluctuating lesions in the right suprapubic region with purulent discharge and erythema  Assessment & Plan:   See Encounters Tab for problem based charting.  Patient discussed with Dr. Heber Hillman

## 2019-03-06 DIAGNOSIS — I70219 Atherosclerosis of native arteries of extremities with intermittent claudication, unspecified extremity: Secondary | ICD-10-CM | POA: Diagnosis not present

## 2019-03-06 DIAGNOSIS — E1169 Type 2 diabetes mellitus with other specified complication: Secondary | ICD-10-CM | POA: Diagnosis not present

## 2019-03-06 DIAGNOSIS — M6282 Rhabdomyolysis: Secondary | ICD-10-CM | POA: Diagnosis not present

## 2019-03-06 DIAGNOSIS — E1142 Type 2 diabetes mellitus with diabetic polyneuropathy: Secondary | ICD-10-CM | POA: Diagnosis not present

## 2019-03-06 DIAGNOSIS — E1151 Type 2 diabetes mellitus with diabetic peripheral angiopathy without gangrene: Secondary | ICD-10-CM | POA: Diagnosis not present

## 2019-03-06 DIAGNOSIS — L02224 Furuncle of groin: Secondary | ICD-10-CM | POA: Diagnosis not present

## 2019-03-07 NOTE — Progress Notes (Signed)
Internal Medicine Clinic Attending  Case discussed with Dr. Helberg at the time of the visit.  We reviewed the resident's history and exam and pertinent patient test results.  I agree with the assessment, diagnosis, and plan of care documented in the resident's note.    

## 2019-03-10 ENCOUNTER — Other Ambulatory Visit: Payer: Self-pay

## 2019-03-10 ENCOUNTER — Encounter: Payer: Self-pay | Admitting: Infectious Disease

## 2019-03-10 ENCOUNTER — Ambulatory Visit (INDEPENDENT_AMBULATORY_CARE_PROVIDER_SITE_OTHER): Payer: Medicare Other | Admitting: Infectious Disease

## 2019-03-10 VITALS — BP 189/72 | HR 73 | Temp 97.4°F | Wt 174.0 lb

## 2019-03-10 DIAGNOSIS — L0292 Furuncle, unspecified: Secondary | ICD-10-CM

## 2019-03-10 DIAGNOSIS — I70213 Atherosclerosis of native arteries of extremities with intermittent claudication, bilateral legs: Secondary | ICD-10-CM | POA: Diagnosis not present

## 2019-03-10 DIAGNOSIS — Z89611 Acquired absence of right leg above knee: Secondary | ICD-10-CM

## 2019-03-10 DIAGNOSIS — I779 Disorder of arteries and arterioles, unspecified: Secondary | ICD-10-CM

## 2019-03-10 DIAGNOSIS — B20 Human immunodeficiency virus [HIV] disease: Secondary | ICD-10-CM | POA: Diagnosis not present

## 2019-03-10 DIAGNOSIS — Z794 Long term (current) use of insulin: Secondary | ICD-10-CM

## 2019-03-10 DIAGNOSIS — E1169 Type 2 diabetes mellitus with other specified complication: Secondary | ICD-10-CM

## 2019-03-10 DIAGNOSIS — Z449 Encounter for fitting and adjustment of unspecified external prosthetic device: Secondary | ICD-10-CM

## 2019-03-10 HISTORY — DX: Encounter for fitting and adjustment of unspecified external prosthetic device: Z44.9

## 2019-03-10 MED ORDER — DOXYCYCLINE HYCLATE 100 MG PO CAPS: 100.0000 mg | ORAL_CAPSULE | Freq: Two times a day (BID) | ORAL | 0 refills | Status: DC

## 2019-03-10 NOTE — Progress Notes (Signed)
Chief complaint: followup for HIV on meds, complaining of an area where he has a boil in his belt line also pain where he is right below the knee amputation site is not fitting well with his prosthesis  Subjective:    Patient ID: Nathan [name], male    DOB: 05/13/1950, 68 y.o.   MRN: [id number]  HPI  Nathan [name] is a 68 y.o. male whose HIV has been perfectly suppressed with salvage regimen of  twice daily combivir, isentress and once daily viread with undetectable viral load and health cd4 count. (he has EXTENSIVE R with R to all NNRTI, R to nearly all PI except DRV, with still activity from TNF, AZT and the isentress, changed to BID AZT, ISENTRESS, and daily Descovy, now changed to BIKTARVY once daily and twice daily AZT.  We ran [employer] which is shown below:       Since I last saw Nathan [name] he was hospitalized with pneumonia and lost quite a bit of weight.  He was at skilled nursing facility and then was ultimately discharged he had a flu shot while there.  He had sex with another partner orally and has subsequently contracted syphilis and received 1 dose of benzathine penicillin.  He had also developed boils along his pant line in the skilled nurse facility and these have been treated and then resolved but now has recurrence has been seen by Dr. Tarri Abernethy in internal medicine and put on appropriate antibiotic with doxycycline.  He shows me an area where there is no longer a boil but wound with a little bit of purulence.  I think doxycycline is an appropriate drug and I would recommend continued bit further.  Certainly if it worsens we can change to a different antimicrobial but cover targeting MRSA and MSSA he would seem most reasonable.    Past Medical History:  Diagnosis Date  . Asthma    per 2003 [facility] pulm records pfts   . Blood dyscrasia    HIV  . Clotting disorder ([place])   . Colon polyps    noted previous colonoscopy [facility]  . DDD (degenerative disc disease)    cervical  spine  . Depression   . Diabetes mellitus    dx 2010  . Fever    [place] origin  . GERD (gastroesophageal reflux disease)   . Head swelling 05/23/2017  . Hep C w/o coma, chronic ([place])   . History of syphilis    noted [facility] records  . HIV infection ([place])    undetectable viral load and CD4 ct 667 as of 11/2011  . Hyperlipidemia   . Hypertension   . MRSA (methicillin resistant Staphylococcus aureus)   . Multiple sclerosis ([place])    in remission as of 11/2011 (diagnosed late 1980s)  . Pain in limb-Right Leg 10/13/2013  . 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 Pneumonia   . PVD (peripheral vascular disease) ([place])    Left Stent 07/02/2008  . Scalp lesion 07/26/2017  . UTI (urinary tract infection)   . Wears dentures     Past Surgical History:  Procedure Laterality Date  . ABDOMINAL AORTOGRAM W/LOWER EXTREMITY N/A 08/07/2017   Procedure: ABDOMINAL AORTOGRAM W/LOWER EXTREMITY;  Surgeon: Serafina Mitchell, MD;  Location: [facility];  Service: Cardiovascular;  Laterality: N/A;  . ABOVE KNEE LEG AMPUTATION     Right Leg  . COLONOSCOPY W/ BIOPSIES AND POLYPECTOMY    . LOWER EXTREMITY ANGIOGRAM Left 12/26/2011  Procedure: LOWER EXTREMITY ANGIOGRAM;  Surgeon: Serafina Mitchell, MD;  Location: [facility] CATH LAB;  Service: Cardiovascular;  Laterality: Left;  . LOWER EXTREMITY ANGIOGRAM Left 08/17/2017   Procedure: LOWER EXTREMITY ANGIOGRAM LEFT LEG WITH RUNOFF AND Stenting.;  Surgeon: Serafina Mitchell, MD;  Location: [facility];  Service: Vascular;  Laterality: Left;  Marland Kitchen MULTIPLE TOOTH EXTRACTIONS    . OTHER SURGICAL HISTORY     right lower ext AKA with prothesis   . OTHER SURGICAL HISTORY     left left with stents (Dr. Seward Speck)  . OTHER SURGICAL HISTORY     2003 colonoscopy 5 mm polyp transverse colon; (2)92m  polyps in rectum-hyperplastic  . PERIPHERAL VASCULAR INTERVENTION Left 08/17/2017   Procedure: POPLITEAL STENT;  Surgeon: BSerafina Mitchell MD;  [facility];   Service: Vascular;  Laterality: Left;    Family History  Problem Relation Age of Onset  . Diabetes Mother   . Coronary artery disease Mother   . Cancer Brother        colon caner stage 4 as of 11/2011 (unknown age of onset)  . Coronary artery disease Father   . Prostate cancer Brother   . Colon cancer Brother   . Rectal cancer Neg Hx       Social History   Socioeconomic History  . Marital status: Single    Spouse name: Not on file  . Number of children: Not on file  . Years of education: 150 . Highest education level: Not on file  Occupational History  . Not on file  Social Needs  . Financial resource strain: Not on file  . Food insecurity    Worry: Not on file    Inability: Not on file  . Transportation needs    Medical: Not on file    Non-medical: Not on file  Tobacco Use  . Smoking status: Former Smoker    Quit date: 05/09/2007    Years since quitting: 11.8  . Smokeless tobacco: Former [employer] developer   Types: Chew  Substance and Sexual Activity  . Alcohol use: No    Alcohol/week: 0.0 standard drinks  . Drug use: No  . Sexual activity: Yes    Partners: Male    Comment: declined condoms  Lifestyle  . Physical activity    Days per week: Not on file    Minutes per session: Not on file  . Stress: Not on file  Relationships  . Social [place] phone: Not on file    Gets together: Not on file    Attends religious service: Not on file    Active member of club or organization: Not on file    Attends meetings of clubs or organizations: Not on file    Relationship status: Not on file  Other Topics Concern  . Not on file  Social History Narrative  . Not on file    Allergies  Allergen Reactions  . Sulfonamide Derivatives Hives     Current Outpatient Medications:  .  atorvastatin (LIPITOR) 40 MG tablet, TAKE 1 TABLET(40 MG) BY MOUTH DAILY, Disp: 90 tablet, Rfl: 1 .  bictegravir-emtricitabine-tenofovir AF (BIKTARVY) 50-200-25 MG TABS tablet, Take 1  tablet by mouth daily., Disp: 30 tablet, Rfl: 5 .  bisacodyl (DULCOLAX) 10 MG suppository, If not relieved by MOM, give 10 mg Bisacodyl suppositiory rectally X 1 dose in 24 hours as needed (Do not use constipation standing orders for residents with renal failure/CFR less than 30. Contact  MD for orders) (Physician Order), Disp: , Rfl:  .  Blood Glucose Monitoring Suppl (BAYER CONTOUR MONITOR) W/DEVICE KIT, Use to check blood sugar as instructed up to 4 times a day (Patient not taking: Reported on 02/06/2019), Disp: 1 kit, Rfl: 0 .  clopidogrel (PLAVIX) 75 MG tablet, Take 1 tablet (75 mg total) by mouth daily., Disp: 90 tablet, Rfl: 3 .  doxycycline (VIBRAMYCIN) 100 MG capsule, Take 1 capsule (100 mg total) by mouth 2 (two) times daily., Disp: 14 capsule, Rfl: 0 .  glucose blood (CONTOUR NEXT TEST) test strip, 1 each by Other route 4 (four) times daily. Use 4 times daily to check blood sugar. diag code E11.42. (Patient not taking: Reported on 02/06/2019), Disp: 375 each, Rfl: 1 .  Insulin Glargine, 2 Unit Dial, (TOUJEO MAX SOLOSTAR) 300 UNIT/ML SOPN, Inject 70 Units into the skin at bedtime., Disp: 5 pen, Rfl: 3 .  Insulin Lispro Junior KwikPen 100 UNIT/ML SOPN, Inject 24 Units into the skin 3 (three) times daily. For DM, Disp: 15 mL, Rfl: 3 .  Insulin Pen Needle (BD PEN NEEDLE NANO U/F) 32G X 4 MM MISC, USE AS DIRECTED FOUR TIMES DAILY. Dx code  E11.42, Disp: 200 each, Rfl: 5 .  Lancet Devices (BAYER MICROLET 2 LANCING DEVIC) MISC, Use to check blood sugar up to 3 ties a day (Patient not taking: Reported on 01/16/2019), Disp: 1 each, Rfl: 2 .  liraglutide (VICTOZA) 18 MG/3ML SOPN, ADMINISTER 1.8 MG UNDER THE SKIN EVERY MORNING, Disp: 9 mL, Rfl: 6 .  lisinopril (ZESTRIL) 30 MG tablet, Take 1 tablet (30 mg total) by mouth daily. Hold if systolic BP <161, Disp: 90 tablet, Rfl: 3 .  magnesium hydroxide (MILK OF MAGNESIA) 400 MG/5ML suspension, If no BM in 3 days, give 30 cc Milk of Magnesium p.o. x 1 dose in 24  hours as needed (Do not use standing constipation orders for residents with renal failure CFR less than 30. Contact MD for orders) (Physician Order), Disp: , Rfl:  .  Nutritional Supplement LIQD, Take 1 each by mouth 2 (two) times daily. Health Shakes (prefers vanilla or strawberry), Disp: , Rfl:  .  omeprazole (PRILOSEC) 40 MG capsule, Take 1 capsule (40 mg total) by mouth daily., Disp: 90 capsule, Rfl: 0 .  ondansetron (ZOFRAN) 4 MG tablet, Take 1 tablet (4 mg total) by mouth every 6 (six) hours as needed for nausea or vomiting., Disp: 20 tablet, Rfl: 0 .  pioglitazone (ACTOS) 30 MG tablet, Take 1 tablet (30 mg total) by mouth daily., Disp: 90 tablet, Rfl: 3 .  Sodium Phosphates (RA SALINE ENEMA RE), If not relieved by Biscodyl suppository, give disposable Saline Enema rectally X 1 dose/24 hrs as needed (Do not use constipation standing orders for residents with renal failure/CFR less than 30. Contact MD for orders)(Physician Or, Disp: , Rfl:  .  zidovudine (RETROVIR) 300 MG tablet, TAKE 1 TABLET(300 MG) BY MOUTH TWICE DAILY, Disp: 60 tablet, Rfl: 5  Review of Systems  Constitutional: Negative for activity change, appetite change, chills, diaphoresis and unexpected weight change.  HENT: Negative for sinus pressure, sneezing and trouble swallowing.   Eyes: Negative for photophobia and visual disturbance.  Respiratory: Negative for chest tightness and stridor.   Cardiovascular: Negative for palpitations and leg swelling.  Gastrointestinal: Negative for abdominal distention, anal bleeding, blood in stool and constipation.  Genitourinary: Negative for difficulty urinating, dysuria, flank pain and hematuria.  Musculoskeletal: Positive for arthralgias and myalgias. Negative for back pain, gait  problem and joint swelling.  Skin: Positive for wound. Negative for color change and pallor.  Neurological: Negative for dizziness, tremors, weakness and light-headedness.  Hematological: Negative for  adenopathy. Does not bruise/bleed easily.  Psychiatric/Behavioral: Negative for agitation, behavioral problems, confusion, decreased concentration, dysphoric mood and sleep disturbance.       Objective:   Physical Exam  Constitutional: He is oriented to person, place, and time. He appears well-developed and well-nourished. No distress.  HENT:  Head: Atraumatic.  Mouth/Throat: Oropharynx is clear and moist. No oropharyngeal exudate.  Eyes: Conjunctivae and EOM are normal. No scleral icterus.  Neck: Normal range of motion. Neck supple.  Cardiovascular: Normal rate and regular rhythm.  Pulmonary/Chest: Effort normal. No respiratory distress. He has no wheezes.  Abdominal: He exhibits no distension.  Musculoskeletal:        General: No tenderness or edema.  Neurological: He is alert and oriented to person, place, and time.  Skin: Skin is warm and dry. He is not diaphoretic. No erythema. No pallor.  Psychiatric: He has a normal mood and affect. His behavior is normal. Judgment and thought content normal.  Nursing note and vitals reviewed.   Wound along the belt line March 10, 2019:       Assessment & Plan:    HIV: highly R virus:   To new Biktarvy daily and twice daily AZT.  As mentioned previously could consider the idea of just Biktarvy being the drug that is used with only 2 fully active drugs but have been reticent with his history of multidrug resistant virus and the fact that he does tolerate the AZT.  It would be nice to have a newer drug such as Islatravir available that is less toxic than AZT  Syphilis: Status post 1 shot of benzathine penicillin last week we will follow-up titers in the future.  His cough that he contracted this a couple of days after being discharged from a skilled nursing facility and says it was some only one partner and he has not had sex in a year so it does not sound like he has extensive genetic sexual network that would put him at higher risk  for COVID-19  Likely MRSA versus MSSA boil along the beltline: I have given him more doxycycline but he is to call me if this worsens  History of below the knee amputations bilaterally with 1 the right having trouble fitting prosthesis due to weight loss: He is to follow-up with the prosthetic clinic.    DM: followed closely in IM clinic  PVD  Having angiogram soon  HTN: followed by PCP  There were no vitals filed for this visit.    I spent greater than 25 minutes with the patient including greater than 50% of time in face to face counsel of the patient reviewing his labs going over how he contracted syphilis, reviewing the U equals U data, again going over the fact that you equal you does not prevent people from contracting other sexually transmitted infections, also reviewing the fact that sex is one of the highest risk factors for Covid 2019 virus infection and in coordination of care

## 2019-03-11 DIAGNOSIS — E1151 Type 2 diabetes mellitus with diabetic peripheral angiopathy without gangrene: Secondary | ICD-10-CM | POA: Diagnosis not present

## 2019-03-11 DIAGNOSIS — E1142 Type 2 diabetes mellitus with diabetic polyneuropathy: Secondary | ICD-10-CM | POA: Diagnosis not present

## 2019-03-11 DIAGNOSIS — M6282 Rhabdomyolysis: Secondary | ICD-10-CM | POA: Diagnosis not present

## 2019-03-11 DIAGNOSIS — E1169 Type 2 diabetes mellitus with other specified complication: Secondary | ICD-10-CM | POA: Diagnosis not present

## 2019-03-11 DIAGNOSIS — L02224 Furuncle of groin: Secondary | ICD-10-CM | POA: Diagnosis not present

## 2019-03-11 DIAGNOSIS — I70219 Atherosclerosis of native arteries of extremities with intermittent claudication, unspecified extremity: Secondary | ICD-10-CM | POA: Diagnosis not present

## 2019-03-13 DIAGNOSIS — E1169 Type 2 diabetes mellitus with other specified complication: Secondary | ICD-10-CM | POA: Diagnosis not present

## 2019-03-13 DIAGNOSIS — E1142 Type 2 diabetes mellitus with diabetic polyneuropathy: Secondary | ICD-10-CM | POA: Diagnosis not present

## 2019-03-13 DIAGNOSIS — M6282 Rhabdomyolysis: Secondary | ICD-10-CM | POA: Diagnosis not present

## 2019-03-13 DIAGNOSIS — E1151 Type 2 diabetes mellitus with diabetic peripheral angiopathy without gangrene: Secondary | ICD-10-CM | POA: Diagnosis not present

## 2019-03-13 DIAGNOSIS — L02224 Furuncle of groin: Secondary | ICD-10-CM | POA: Diagnosis not present

## 2019-03-13 DIAGNOSIS — I70219 Atherosclerosis of native arteries of extremities with intermittent claudication, unspecified extremity: Secondary | ICD-10-CM | POA: Diagnosis not present

## 2019-03-19 ENCOUNTER — Telehealth: Payer: Self-pay | Admitting: *Deleted

## 2019-03-19 DIAGNOSIS — E1142 Type 2 diabetes mellitus with diabetic polyneuropathy: Secondary | ICD-10-CM | POA: Diagnosis not present

## 2019-03-19 DIAGNOSIS — I70219 Atherosclerosis of native arteries of extremities with intermittent claudication, unspecified extremity: Secondary | ICD-10-CM | POA: Diagnosis not present

## 2019-03-19 DIAGNOSIS — E1169 Type 2 diabetes mellitus with other specified complication: Secondary | ICD-10-CM | POA: Diagnosis not present

## 2019-03-19 DIAGNOSIS — L02224 Furuncle of groin: Secondary | ICD-10-CM | POA: Diagnosis not present

## 2019-03-19 DIAGNOSIS — E1151 Type 2 diabetes mellitus with diabetic peripheral angiopathy without gangrene: Secondary | ICD-10-CM | POA: Diagnosis not present

## 2019-03-19 DIAGNOSIS — M6282 Rhabdomyolysis: Secondary | ICD-10-CM | POA: Diagnosis not present

## 2019-03-19 NOTE — Telephone Encounter (Signed)
Called to schedule his 6 month f/up appt. He wanted to be sure to have stump checked at this appointment and be seen ASAP.  He is now having pain when he wears prosthesis and [employer] encouraged him to have this checked. He denies any infection of this area. Informed him scheduling will call with appt and would be sure on a Monday.

## 2019-03-20 ENCOUNTER — Telehealth: Payer: Self-pay

## 2019-03-20 NOTE — Telephone Encounter (Signed)
LM for rtc 

## 2019-03-20 NOTE — Telephone Encounter (Signed)
Monique with [facility] want to informed the doctor, pt want to be seen for PT one times a week instead of two times a week.

## 2019-03-21 ENCOUNTER — Other Ambulatory Visit: Payer: Self-pay

## 2019-03-21 ENCOUNTER — Encounter: Payer: Self-pay | Admitting: Family

## 2019-03-21 ENCOUNTER — Ambulatory Visit (INDEPENDENT_AMBULATORY_CARE_PROVIDER_SITE_OTHER): Payer: Medicare Other | Admitting: Family

## 2019-03-21 VITALS — BP 149/70 | HR 78 | Temp 97.8°F | Resp 18 | Ht 73.0 in | Wt 175.0 lb

## 2019-03-21 DIAGNOSIS — I6523 Occlusion and stenosis of bilateral carotid arteries: Secondary | ICD-10-CM | POA: Diagnosis not present

## 2019-03-21 DIAGNOSIS — Z87891 Personal history of nicotine dependence: Secondary | ICD-10-CM | POA: Diagnosis not present

## 2019-03-21 DIAGNOSIS — T879 Unspecified complications of amputation stump: Secondary | ICD-10-CM | POA: Diagnosis not present

## 2019-03-21 DIAGNOSIS — I779 Disorder of arteries and arterioles, unspecified: Secondary | ICD-10-CM

## 2019-03-21 DIAGNOSIS — Z89611 Acquired absence of right leg above knee: Secondary | ICD-10-CM

## 2019-03-21 NOTE — Progress Notes (Signed)
[facility] [place]   CC: new occasional clicking, mobile, painful sensation at right AKA when weight bearing in prosthesis, hx of peripheral artery occlusive disease  History of Present Illness Nathan [name] is a 69 y.o. male who is s/p stent placement of left popliteal artery on 08-17-17 by Dr. Trula Slade for left leg rest pain.  He is alsos/pleft SFA stenting in 2010. He is s/p right AKAin 2009.  Ptis ambulating with a right above-knee prosthesis. Heworked with [employer] to address themoderate pain he was havingat the bony prominence of the right AKA stump with walking, and had resolution of this pain. He denies any stump wounds. He has stinging and burning in foot up to left knee, attributes to neuropathy.  Pt states left calf feels much improved since the procedureon 08-17-17, but left foot feels no improvement, no wounds per pt.  He states his left foot is numb, states has worsened over time.   He has MS, which he states has been in remission for years.  Pt states he does not have DDD, no back problems.   Pt denies any history of stroke or TIA.  He was last evaluated on 06-25-18 by [employer]. At that time carotid duplex showed 60-79% bilateral carotid artery stenosis at his last visit and was asymptomatic. There were no significant changes to his ABI's on the left LE. The popliteal stent had elevated velocities suggestive of stenosis, it was 204 cm/sec in the proximal stent.  He remained asymptomatic without claudication, non healing wounds, or rest pain.   Pt was to return in 9 months with repeat arterial bypass duplex, ABI's and repeat carotid duplex.  If he has problems or concerns he was to call sooner.  He returns today with c/o painful clicking sensation in his right AKA stump, only when he is walking with his prosthesis.  He had not been walking for several weeks due to his severe illness with pneumonia in September 2020, was in rehab  until mid October 2020.   He denies fever or chills, denies shortness of breath, denies chest pain.   He is scheduled to follow up on 04-21-19 with Dr. Trula Slade, carotid, and lower extremity studies.    Diabetic: Yes,7.4 A1C on 11-11-18 Tobacco use: former smoker, quit in 2009  Pt meds include: Statin :Yes ASA: no Other anticoagulants/antiplatelets: Plavix     Past Medical History:  Diagnosis Date  . Asthma    per 2003 [facility] pulm records pfts   . Blood dyscrasia    HIV  . Clotting disorder ([place])   . Colon polyps    noted previous colonoscopy [facility]  . DDD (degenerative disc disease)    cervical spine  . Depression   . Diabetes mellitus    dx 2010  . Fever    unknwon origin  . GERD (gastroesophageal reflux disease)   . Head swelling 05/23/2017  . Hep C w/o coma, chronic ([place])   . History of syphilis    noted [facility] records  . HIV infection ([place])    undetectable viral load and CD4 ct 667 as of 11/2011  . Hyperlipidemia   . Hypertension   . MRSA (methicillin resistant Staphylococcus aureus)   . Multiple sclerosis ([place])    in remission as of 11/2011 (diagnosed late 1980s)  . Pain in limb-Right Leg 10/13/2013  . 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[place])    2002  . Pneumonia   . Prosthesis fitting 03/10/2019  . PVD (peripheral vascular  disease) ([place])    Left Stent 07/02/2008  . Scalp lesion 07/26/2017  . UTI (urinary tract infection)   . Wears dentures     Social History Social History   Tobacco Use  . Smoking status: Former Smoker    Quit date: 05/09/2007    Years since quitting: 11.8  . Smokeless tobacco: Former Systems developer    Types: Chew  Substance Use Topics  . Alcohol use: No    Alcohol/week: 0.0 standard drinks  . Drug use: No    Family History Family History  Problem Relation Age of Onset  . Diabetes Mother   . Coronary artery disease Mother   . Cancer Brother        colon caner stage 4 as of 11/2011 (unknown age of onset)  . Coronary artery disease  Father   . Prostate cancer Brother   . Colon cancer Brother   . Rectal cancer Neg Hx     Past Surgical History:  Procedure Laterality Date  . ABDOMINAL AORTOGRAM W/LOWER EXTREMITY N/A 08/07/2017   Procedure: ABDOMINAL AORTOGRAM W/LOWER EXTREMITY;  Surgeon: Serafina Mitchell, MD;  Location: [place] [facility];  Service: Cardiovascular;  Laterality: N/A;  . ABOVE KNEE LEG AMPUTATION     Right Leg  . COLONOSCOPY W/ BIOPSIES AND POLYPECTOMY    . LOWER EXTREMITY ANGIOGRAM Left 12/26/2011   Procedure: LOWER EXTREMITY ANGIOGRAM;  Surgeon: Serafina Mitchell, MD;  Location: [facility] CATH LAB;  Service: Cardiovascular;  Laterality: Left;  . LOWER EXTREMITY ANGIOGRAM Left 08/17/2017   Procedure: LOWER EXTREMITY ANGIOGRAM LEFT LEG WITH RUNOFF AND Stenting.;  Surgeon: Serafina Mitchell, MD;  Location: [place];  Service: Vascular;  Laterality: Left;  Marland Kitchen MULTIPLE TOOTH EXTRACTIONS    . OTHER SURGICAL HISTORY     right lower ext AKA with prothesis   . OTHER SURGICAL HISTORY     left left with stents (Dr. Seward Speck)  . OTHER SURGICAL HISTORY     2003 colonoscopy 5 mm polyp transverse colon; (2)42m  polyps in rectum-hyperplastic  . PERIPHERAL VASCULAR INTERVENTION Left 08/17/2017   Procedure: POPLITEAL STENT;  Surgeon: BSerafina Mitchell MD;  [place]  Service: Vascular;  Laterality: Left;    Allergies  Allergen Reactions  . Sulfonamide Derivatives Hives    Current Outpatient Medications  Medication Sig Dispense Refill  . atorvastatin (LIPITOR) 40 MG tablet TAKE 1 TABLET(40 MG) BY MOUTH DAILY 90 tablet 1  . bictegravir-emtricitabine-tenofovir AF (BIKTARVY) 50-200-25 MG TABS tablet Take 1 tablet by mouth daily. 30 tablet 5  . Blood Glucose Monitoring Suppl (BAYER CONTOUR MONITOR) W/DEVICE KIT Use to check blood sugar as instructed up to 4 times a day 1 kit 0  . clopidogrel (PLAVIX) 75 MG tablet Take 1 tablet (75 mg total) by mouth daily. 90 tablet 3  . doxycycline (VIBRAMYCIN) 100 MG capsule Take 1  capsule (100 mg total) by mouth 2 (two) times daily. 14 capsule 0  . glucose blood (CONTOUR NEXT TEST) test strip 1 each by Other route 4 (four) times daily. Use 4 times daily to check blood sugar. diag code E11.42. 375 each 1  . Insulin Glargine, 2 Unit Dial, (TOUJEO MAX SOLOSTAR) 300 UNIT/ML SOPN Inject 70 Units into the skin at bedtime. 5 pen 3  . Insulin Lispro Junior KwikPen 100 UNIT/ML SOPN Inject 24 Units into the skin 3 (three) times daily. For DM 15 mL 3  . Insulin Pen Needle (BD PEN NEEDLE NANO U/F) 32G X 4 MM MISC USE  AS DIRECTED FOUR TIMES DAILY. Dx code  E11.42 200 each 5  . Lancet Devices (BAYER MICROLET 2 LANCING DEVIC) MISC Use to check blood sugar up to 3 ties a day 1 each 2  . liraglutide (VICTOZA) 18 MG/3ML SOPN ADMINISTER 1.8 MG UNDER THE SKIN EVERY MORNING 9 mL 6  . lisinopril (ZESTRIL) 30 MG tablet Take 1 tablet (30 mg total) by mouth daily. Hold if systolic BP <409 90 tablet 3  . omeprazole (PRILOSEC) 40 MG capsule Take 1 capsule (40 mg total) by mouth daily. 90 capsule 0  . zidovudine (RETROVIR) 300 MG tablet TAKE 1 TABLET(300 MG) BY MOUTH TWICE DAILY 60 tablet 5   No current facility-administered medications for this visit.     ROS: See HPI for pertinent positives and negatives.   Physical Examination  Vitals:   03/21/19 1039  BP: (!) 149/70  Pulse: 78  Resp: 18  Temp: 97.8 F (36.6 C)  TempSrc: Temporal  SpO2: 98%  Weight: 175 lb (79.4 kg)  Height: _0  (1.854 m)   Body mass index is 23.09 kg/m.  General: A&O x 3, WDWN, male in NAD. Gait: wearing right AKA prosthesis and using arm brace type crutch HEENT: No gross abnormalities.  Pulmonary: Respirations are non labored, good air movement in all fields except right posterior which has a few scattered rales, no rhonchi or wheezes Cardiac: regular rhythm, no detected murmur.         Carotid Bruits Right Left   Negative Negative   Radial pulses are 2+ palpable bilaterally   Adominal aortic pulse is  not palpable                         VASCULAR EXAM: Extremities without ischemic changes, without Gangrene; without open wounds. All left toes are pink and warm with brisk capillary refill  No erythema, no wounds, no swelling, no pain to touch at right AKA                                                                                                           LE Pulses Right Left       FEMORAL  not palpable  2+ palpable        POPLITEAL  AKA   not palpable       POSTERIOR TIBIAL  AKA   not palpable        DORSALIS PEDIS      ANTERIOR TIBIAL AKA  not palpable    Abdomen: soft, NT, no palpable masses. Skin: no rashes, no cellulitis, no ulcers noted. Musculoskeletal: no muscle wasting or atrophy.  Neurologic: A&O X 3; appropriate affect, Sensation is normal except for absent to light touch in left forefoot, but does have some sensation to light touch in left 5th toe; MOTOR FUNCTION:  moving all extremities equally, motor strength 5/5 throughout. Speech is fluent/normal. CN 2-12 intact. Psychiatric: Thought content is normal, mood appropriate for clinical situation.    DATA  Left LE Arterial Duplex (06-25-18): Left Duplex  Findings: +----------+--------+-----+---------------+--------+----------------+           PSV cm/sRatioStenosis       WaveformComments         +----------+--------+-----+---------------+--------+----------------+ SFA Mid   89                                                   +----------+--------+-----+---------------+--------+----------------+ SFA Distal190          50-74% stenosisbiphasiclow end of range +----------+--------+-----+---------------+--------+----------------+  A focal velocity elevation of 190 cm/s was obtained at distal SFA with a VR of 2.1. Findings are characteristic of 50-74% stenosis.   Left Stent(s): +---------------+---+---------------+--------++ Prox to Stent  120                         +---------------+---+---------------+--------++ Proximal Stent 20450-99% stenosisbiphasic +---------------+---+---------------+--------++ Mid Stent      159                        +---------------+---+---------------+--------++ Distal Stent   190                        +---------------+---+---------------+--------++ Distal to Stent89                         +---------------+---+---------------+--------++ Summary: Left: 50-74% stenosis noted in the superficial femoral artery. Patent popliteal artery stent with elevated velocities observed in the proximal segment suggestive of a 50-99% stenosis.   ABI (Date: 06-25-18): ? R: AKA ABI Findings: +--------+------------------+-----+--------+--------+ Right   Rt Pressure (mmHg)IndexWaveformComment  +--------+------------------+-----+--------+--------+ [id number]                                     +--------+------------------+-----+--------+--------+  +---------+------------------+-----+--------+-------+ Left     Lt Pressure (mmHg)IndexWaveformComment +---------+------------------+-----+--------+-------+ Brachial 161                                    +---------+------------------+-----+--------+-------+ PTA      115               0.71 biphasic        +---------+------------------+-----+--------+-------+ DP       121               0.75 biphasic        +---------+------------------+-----+--------+-------+ Great Toe70                0.43                 +---------+------------------+-----+--------+-------+  +-------+-----------+-----------+------------+------------+ ABI/TBIToday's ABIToday's TBIPrevious ABIPrevious TBI +-------+-----------+-----------+------------+------------+ Right  AKA        AKA        AKA         AKA          +-------+-----------+-----------+------------+------------+ Left   0.75       0.43       0.79        0.44          +-------+-----------+-----------+------------+------------+ Left ABIs appear essentially unchanged compared to prior study on 03/25/2018.   Summary: Right: AKA. Left: Resting left ankle-brachial index indicates moderate left lower extremity arterial disease. The left toe-brachial  index is abnormal.   ASSESSMENT: Nathan [name] is a 68 y.o. male who iss/p stent placement of left popliteal artery on 08-17-17 by Dr. Trula Slade for left leg rest pain.  He is alsos/pleft SFA stenting in 2010.  He is s/p right AKAin 2009.   He no longer has any left calf claudication since the left popliteal stent placed in April 2019. His left foot is numb, this has worsened over time, no signs of ischemia in his left foot or leg.  He returns today with c/o painful clicking sensation in his right AKA stump, only when he is walking with his prosthesis, no wounds, no swelling, no erythema at the right AKA stump.   He had not been walking for several weeks due to his severe illness with pneumonia in September 2020, was in rehab until mid October 2020.    Dr. Donzetta Matters spoke with and examined pt. Surgery is not needed at this point to address the clicking painful mobile sensation in his right AKA that occurs only with weight bearing in his right AKA prosthesis.  Will refer back to [employer] to refit and stabilize his right AKA stump. Follow up with Dr. Trula Slade as already scheduled, 04-21-19, with LE non invasive testing and carotid duplex.      PLAN:  Will refer back to [employer] to refit and stabilize his right AKA stump. Follow up with Dr. Trula Slade as already scheduled, 04-21-19, with LE non invasive testing and carotid duplex.    I discussed in depth with the patient the nature of atherosclerosis, and emphasized the importance of maximal medical management including strict control of blood pressure, blood glucose, and lipid levels, obtaining regular exercise, and continued cessation of smoking.  The patient  is aware that without maximal medical management the underlying atherosclerotic disease process will progress, limiting the benefit of any interventions.  The patient was given information about PAD including signs, symptoms, treatment, what symptoms should prompt the patient to seek immediate medical care, and risk reduction measures to take.  Clemon Chambers, RN, MSN, FNP-C [facility] [employer] Phone: [phone]  Clinic MD: Donzetta Matters  03/21/19 10:46 AM

## 2019-03-25 DIAGNOSIS — I70219 Atherosclerosis of native arteries of extremities with intermittent claudication, unspecified extremity: Secondary | ICD-10-CM | POA: Diagnosis not present

## 2019-03-25 DIAGNOSIS — M6282 Rhabdomyolysis: Secondary | ICD-10-CM | POA: Diagnosis not present

## 2019-03-25 DIAGNOSIS — L02224 Furuncle of groin: Secondary | ICD-10-CM | POA: Diagnosis not present

## 2019-03-25 DIAGNOSIS — E1142 Type 2 diabetes mellitus with diabetic polyneuropathy: Secondary | ICD-10-CM | POA: Diagnosis not present

## 2019-03-25 DIAGNOSIS — E1169 Type 2 diabetes mellitus with other specified complication: Secondary | ICD-10-CM | POA: Diagnosis not present

## 2019-03-25 DIAGNOSIS — E1151 Type 2 diabetes mellitus with diabetic peripheral angiopathy without gangrene: Secondary | ICD-10-CM | POA: Diagnosis not present

## 2019-03-26 DIAGNOSIS — E1169 Type 2 diabetes mellitus with other specified complication: Secondary | ICD-10-CM | POA: Diagnosis not present

## 2019-03-26 DIAGNOSIS — L02224 Furuncle of groin: Secondary | ICD-10-CM | POA: Diagnosis not present

## 2019-03-26 DIAGNOSIS — K219 Gastro-esophageal reflux disease without esophagitis: Secondary | ICD-10-CM | POA: Diagnosis not present

## 2019-03-26 DIAGNOSIS — Z955 Presence of coronary angioplasty implant and graft: Secondary | ICD-10-CM | POA: Diagnosis not present

## 2019-03-26 DIAGNOSIS — I70219 Atherosclerosis of native arteries of extremities with intermittent claudication, unspecified extremity: Secondary | ICD-10-CM | POA: Diagnosis not present

## 2019-03-26 DIAGNOSIS — G35 Multiple sclerosis: Secondary | ICD-10-CM | POA: Diagnosis not present

## 2019-03-26 DIAGNOSIS — B182 Chronic viral hepatitis C: Secondary | ICD-10-CM | POA: Diagnosis not present

## 2019-03-26 DIAGNOSIS — B370 Candidal stomatitis: Secondary | ICD-10-CM | POA: Diagnosis not present

## 2019-03-26 DIAGNOSIS — Z794 Long term (current) use of insulin: Secondary | ICD-10-CM | POA: Diagnosis not present

## 2019-03-26 DIAGNOSIS — Z8701 Personal history of pneumonia (recurrent): Secondary | ICD-10-CM | POA: Diagnosis not present

## 2019-03-26 DIAGNOSIS — Z89611 Acquired absence of right leg above knee: Secondary | ICD-10-CM | POA: Diagnosis not present

## 2019-03-26 DIAGNOSIS — M5136 Other intervertebral disc degeneration, lumbar region: Secondary | ICD-10-CM | POA: Diagnosis not present

## 2019-03-26 DIAGNOSIS — B20 Human immunodeficiency virus [HIV] disease: Secondary | ICD-10-CM | POA: Diagnosis not present

## 2019-03-26 DIAGNOSIS — Z792 Long term (current) use of antibiotics: Secondary | ICD-10-CM | POA: Diagnosis not present

## 2019-03-26 DIAGNOSIS — J45909 Unspecified asthma, uncomplicated: Secondary | ICD-10-CM | POA: Diagnosis not present

## 2019-03-26 DIAGNOSIS — D689 Coagulation defect, unspecified: Secondary | ICD-10-CM | POA: Diagnosis not present

## 2019-03-26 DIAGNOSIS — E1151 Type 2 diabetes mellitus with diabetic peripheral angiopathy without gangrene: Secondary | ICD-10-CM | POA: Diagnosis not present

## 2019-03-26 DIAGNOSIS — F329 Major depressive disorder, single episode, unspecified: Secondary | ICD-10-CM | POA: Diagnosis not present

## 2019-03-26 DIAGNOSIS — Z7902 Long term (current) use of antithrombotics/antiplatelets: Secondary | ICD-10-CM | POA: Diagnosis not present

## 2019-03-26 DIAGNOSIS — E1142 Type 2 diabetes mellitus with diabetic polyneuropathy: Secondary | ICD-10-CM | POA: Diagnosis not present

## 2019-03-26 DIAGNOSIS — G547 Phantom limb syndrome without pain: Secondary | ICD-10-CM | POA: Diagnosis not present

## 2019-03-26 DIAGNOSIS — M6282 Rhabdomyolysis: Secondary | ICD-10-CM | POA: Diagnosis not present

## 2019-03-26 DIAGNOSIS — I10 Essential (primary) hypertension: Secondary | ICD-10-CM | POA: Diagnosis not present

## 2019-03-28 DIAGNOSIS — E1142 Type 2 diabetes mellitus with diabetic polyneuropathy: Secondary | ICD-10-CM | POA: Diagnosis not present

## 2019-03-28 DIAGNOSIS — E1151 Type 2 diabetes mellitus with diabetic peripheral angiopathy without gangrene: Secondary | ICD-10-CM | POA: Diagnosis not present

## 2019-03-28 DIAGNOSIS — I70219 Atherosclerosis of native arteries of extremities with intermittent claudication, unspecified extremity: Secondary | ICD-10-CM | POA: Diagnosis not present

## 2019-03-28 DIAGNOSIS — E1169 Type 2 diabetes mellitus with other specified complication: Secondary | ICD-10-CM | POA: Diagnosis not present

## 2019-03-28 DIAGNOSIS — L02224 Furuncle of groin: Secondary | ICD-10-CM | POA: Diagnosis not present

## 2019-03-28 DIAGNOSIS — M6282 Rhabdomyolysis: Secondary | ICD-10-CM | POA: Diagnosis not present

## 2019-03-30 DIAGNOSIS — M6282 Rhabdomyolysis: Secondary | ICD-10-CM | POA: Diagnosis not present

## 2019-03-30 DIAGNOSIS — I70219 Atherosclerosis of native arteries of extremities with intermittent claudication, unspecified extremity: Secondary | ICD-10-CM | POA: Diagnosis not present

## 2019-03-30 DIAGNOSIS — E1151 Type 2 diabetes mellitus with diabetic peripheral angiopathy without gangrene: Secondary | ICD-10-CM | POA: Diagnosis not present

## 2019-03-30 DIAGNOSIS — L02224 Furuncle of groin: Secondary | ICD-10-CM | POA: Diagnosis not present

## 2019-03-30 DIAGNOSIS — E1169 Type 2 diabetes mellitus with other specified complication: Secondary | ICD-10-CM | POA: Diagnosis not present

## 2019-03-30 DIAGNOSIS — E1142 Type 2 diabetes mellitus with diabetic polyneuropathy: Secondary | ICD-10-CM | POA: Diagnosis not present

## 2019-04-01 DIAGNOSIS — E1151 Type 2 diabetes mellitus with diabetic peripheral angiopathy without gangrene: Secondary | ICD-10-CM | POA: Diagnosis not present

## 2019-04-01 DIAGNOSIS — E1142 Type 2 diabetes mellitus with diabetic polyneuropathy: Secondary | ICD-10-CM | POA: Diagnosis not present

## 2019-04-01 DIAGNOSIS — L02224 Furuncle of groin: Secondary | ICD-10-CM | POA: Diagnosis not present

## 2019-04-01 DIAGNOSIS — E1169 Type 2 diabetes mellitus with other specified complication: Secondary | ICD-10-CM | POA: Diagnosis not present

## 2019-04-01 DIAGNOSIS — M6282 Rhabdomyolysis: Secondary | ICD-10-CM | POA: Diagnosis not present

## 2019-04-01 DIAGNOSIS — I70219 Atherosclerosis of native arteries of extremities with intermittent claudication, unspecified extremity: Secondary | ICD-10-CM | POA: Diagnosis not present

## 2019-04-08 DIAGNOSIS — M6282 Rhabdomyolysis: Secondary | ICD-10-CM | POA: Diagnosis not present

## 2019-04-08 DIAGNOSIS — I70219 Atherosclerosis of native arteries of extremities with intermittent claudication, unspecified extremity: Secondary | ICD-10-CM | POA: Diagnosis not present

## 2019-04-08 DIAGNOSIS — E1169 Type 2 diabetes mellitus with other specified complication: Secondary | ICD-10-CM | POA: Diagnosis not present

## 2019-04-08 DIAGNOSIS — L02224 Furuncle of groin: Secondary | ICD-10-CM | POA: Diagnosis not present

## 2019-04-08 DIAGNOSIS — E1142 Type 2 diabetes mellitus with diabetic polyneuropathy: Secondary | ICD-10-CM | POA: Diagnosis not present

## 2019-04-08 DIAGNOSIS — E1151 Type 2 diabetes mellitus with diabetic peripheral angiopathy without gangrene: Secondary | ICD-10-CM | POA: Diagnosis not present

## 2019-04-10 DIAGNOSIS — M6282 Rhabdomyolysis: Secondary | ICD-10-CM | POA: Diagnosis not present

## 2019-04-10 DIAGNOSIS — E1142 Type 2 diabetes mellitus with diabetic polyneuropathy: Secondary | ICD-10-CM | POA: Diagnosis not present

## 2019-04-10 DIAGNOSIS — E1169 Type 2 diabetes mellitus with other specified complication: Secondary | ICD-10-CM | POA: Diagnosis not present

## 2019-04-10 DIAGNOSIS — I70219 Atherosclerosis of native arteries of extremities with intermittent claudication, unspecified extremity: Secondary | ICD-10-CM | POA: Diagnosis not present

## 2019-04-10 DIAGNOSIS — E1151 Type 2 diabetes mellitus with diabetic peripheral angiopathy without gangrene: Secondary | ICD-10-CM | POA: Diagnosis not present

## 2019-04-10 DIAGNOSIS — L02224 Furuncle of groin: Secondary | ICD-10-CM | POA: Diagnosis not present

## 2019-04-18 ENCOUNTER — Other Ambulatory Visit: Payer: Self-pay

## 2019-04-18 DIAGNOSIS — I779 Disorder of arteries and arterioles, unspecified: Secondary | ICD-10-CM

## 2019-04-18 DIAGNOSIS — I6523 Occlusion and stenosis of bilateral carotid arteries: Secondary | ICD-10-CM

## 2019-04-21 ENCOUNTER — Ambulatory Visit (HOSPITAL_COMMUNITY)
Admission: RE | Admit: 2019-04-21 | Discharge: 2019-04-21 | Disposition: A | Discharge status: Home / Self Care | Payer: Medicare Other | Source: Ambulatory Visit | Attending: Surgery | Admitting: Surgery

## 2019-04-21 ENCOUNTER — Ambulatory Visit (INDEPENDENT_AMBULATORY_CARE_PROVIDER_SITE_OTHER)
Admission: RE | Admit: 2019-04-21 | Discharge: 2019-04-21 | Disposition: A | Discharge status: Home / Self Care | Payer: Medicare Other | Source: Ambulatory Visit | Attending: Surgery | Admitting: Surgery

## 2019-04-21 ENCOUNTER — Other Ambulatory Visit: Payer: Self-pay

## 2019-04-21 ENCOUNTER — Ambulatory Visit (INDEPENDENT_AMBULATORY_CARE_PROVIDER_SITE_OTHER): Payer: Medicare Other | Admitting: Surgery

## 2019-04-21 ENCOUNTER — Encounter: Payer: Self-pay | Admitting: Surgery

## 2019-04-21 VITALS — BP 127/70 | HR 66 | Temp 97.2°F | Resp 20 | Ht 73.0 in | Wt 171.0 lb

## 2019-04-21 DIAGNOSIS — I779 Disorder of arteries and arterioles, unspecified: Secondary | ICD-10-CM

## 2019-04-21 DIAGNOSIS — I6523 Occlusion and stenosis of bilateral carotid arteries: Secondary | ICD-10-CM

## 2019-04-21 DIAGNOSIS — I70213 Atherosclerosis of native arteries of extremities with intermittent claudication, bilateral legs: Secondary | ICD-10-CM | POA: Diagnosis not present

## 2019-04-21 NOTE — Progress Notes (Signed)
Vascular and Vein Specialist of [facility]  Patient name: Nathan [name] MRN: [id number] DOB: 14-Apr-1951 Sex: male   REASON FOR VISIT:    Follow up  HISOTRY OF PRESENT ILLNESS:    Nathan [name] is a 68 y.o. male who is back today for follow-up.  He initially presented with severe right leg pain in 2008.  He underwent a right femoral to below-knee popliteal artery bypass graft with saphenous vein on 02/26/2007.  This occluded and on 11/12/2007 he had a redo bypass with [employer].  He ultimately ended up with a right above-knee amputation.  In 2010 he began having left leg claudication and underwent popliteal artery stenting.  This was repeated on 08/17/2017  He was recently hospitalized for pneumonia which required a stay in rehab.  He is now back at home.  He continues to take dual antiplatelet therapy and a statin.  He does not have any open wounds or claudication symptoms, or problems with his stump.   PAST MEDICAL HISTORY:   Past Medical History:  Diagnosis Date  . Asthma    per 2003 [facility] pulm records pfts   . Blood dyscrasia    HIV  . Clotting disorder ([place])   . Colon polyps    noted previous colonoscopy [facility]  . DDD (degenerative disc disease)    cervical spine  . Depression   . Diabetes mellitus    dx 2010  . Fever    unknwon origin  . GERD (gastroesophageal reflux disease)   . Head swelling 05/23/2017  . Hep C w/o coma, chronic ([place])   . History of syphilis    noted [facility] records  . HIV infection ([place])    undetectable viral load and CD4 ct 667 as of 11/2011  . Hyperlipidemia   . Hypertension   . MRSA (methicillin resistant Staphylococcus aureus)   . Multiple sclerosis ([place])    in remission as of 11/2011 (diagnosed late 1980s)  . Pain in limb-Right Leg 10/13/2013  . 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 Pneumonia   . Prosthesis fitting 03/10/2019  . PVD (peripheral vascular disease) ([place])    Left Stent 07/02/2008   . Scalp lesion 07/26/2017  . UTI (urinary tract infection)   . Wears dentures      FAMILY HISTORY:   Family History  Problem Relation Age of Onset  . Diabetes Mother   . Coronary artery disease Mother   . Cancer Brother        colon caner stage 4 as of 11/2011 (unknown age of onset)  . Coronary artery disease Father   . Prostate cancer Brother   . Colon cancer Brother   . Rectal cancer Neg Hx     SOCIAL HISTORY:   Social History   Tobacco Use  . Smoking status: Former Smoker    Quit date: 05/09/2007    Years since quitting: 11.9  . Smokeless tobacco: Former Systems developer    Types: Chew  Substance Use Topics  . Alcohol use: No    Alcohol/week: 0.0 standard drinks     ALLERGIES:   Allergies  Allergen Reactions  . Sulfonamide Derivatives Hives     CURRENT MEDICATIONS:   Current Outpatient Medications  Medication Sig Dispense Refill  . atorvastatin (LIPITOR) 40 MG tablet TAKE 1 TABLET(40 MG) BY MOUTH DAILY 90 tablet 1  . bictegravir-emtricitabine-tenofovir AF (BIKTARVY) 50-200-25 MG TABS tablet Take 1 tablet by mouth daily. 30 tablet 5  . Blood Glucose Monitoring Suppl (  BAYER CONTOUR MONITOR) W/DEVICE KIT Use to check blood sugar as instructed up to 4 times a day 1 kit 0  . clopidogrel (PLAVIX) 75 MG tablet Take 1 tablet (75 mg total) by mouth daily. 90 tablet 3  . doxycycline (VIBRAMYCIN) 100 MG capsule Take 1 capsule (100 mg total) by mouth 2 (two) times daily. 14 capsule 0  . glucose blood (CONTOUR NEXT TEST) test strip 1 each by Other route 4 (four) times daily. Use 4 times daily to check blood sugar. diag code E11.42. 375 each 1  . Insulin Glargine, 2 Unit Dial, (TOUJEO MAX SOLOSTAR) 300 UNIT/ML SOPN Inject 70 Units into the skin at bedtime. 5 pen 3  . Insulin Lispro Junior KwikPen 100 UNIT/ML SOPN Inject 24 Units into the skin 3 (three) times daily. For DM 15 mL 3  . Insulin Pen Needle (BD PEN NEEDLE NANO U/F) 32G X 4 MM MISC USE AS DIRECTED FOUR TIMES DAILY. Dx code   E11.42 200 each 5  . Lancet Devices (BAYER MICROLET 2 LANCING DEVIC) MISC Use to check blood sugar up to 3 ties a day 1 each 2  . liraglutide (VICTOZA) 18 MG/3ML SOPN ADMINISTER 1.8 MG UNDER THE SKIN EVERY MORNING 9 mL 6  . lisinopril (ZESTRIL) 30 MG tablet Take 1 tablet (30 mg total) by mouth daily. Hold if systolic BP <856 90 tablet 3  . omeprazole (PRILOSEC) 40 MG capsule Take 1 capsule (40 mg total) by mouth daily. 90 capsule 0  . zidovudine (RETROVIR) 300 MG tablet TAKE 1 TABLET(300 MG) BY MOUTH TWICE DAILY 60 tablet 5   No current facility-administered medications for this visit.    REVIEW OF SYSTEMS:   '[X]'  denotes positive finding, '[ ]'  denotes negative finding Cardiac  Comments:  Chest pain or chest pressure:    Shortness of breath upon exertion:    Short of breath when lying flat:    Irregular heart rhythm:        Vascular    Pain in calf, thigh, or hip brought on by ambulation:    Pain in feet at night that wakes you up from your sleep:     Blood clot in your veins:    Leg swelling:         Pulmonary    Oxygen at home:    Productive cough:     Wheezing:         Neurologic    Sudden weakness in arms or legs:     Sudden numbness in arms or legs:     Sudden onset of difficulty speaking or slurred speech:    Temporary loss of vision in one eye:     Problems with dizziness:         Gastrointestinal    Blood in stool:     Vomited blood:         Genitourinary    Burning when urinating:     Blood in urine:        Psychiatric    Major depression:         Hematologic    Bleeding problems:    Problems with blood clotting too easily:        Skin    Rashes or ulcers:        Constitutional    Fever or chills:      PHYSICAL EXAM:   There were no vitals filed for this visit.  GENERAL: The patient is a well-nourished male, in no acute distress.  The vital signs are documented above. CARDIAC: There is a regular rate and rhythm.  PULMONARY: Non-labored  respirations.  MUSCULOSKELETAL: There are no major deformities or cyanosis. NEUROLOGIC: No focal weakness or paresthesias are detected. SKIN: There are no ulcers or rashes noted. PSYCHIATRIC: The patient has a normal affect.  STUDIES:   I have reviewed the following: ABI/TBIToday's ABIToday's TBIPrevious ABIPrevious TBI +-------+-----------+-----------+------------+------------+ Right  AKA                                            +-------+-----------+-----------+------------+------------+ Left   0.72       0.50       0.75        0.43         +-------+-----------+-----------+------------+------------+  Left: 50 - 74% mid SFA stenosis. The popliteal stent appears patent, unable to visualized increased velocity. +----------+--------+-----+---------------+---------+-------------------+ LEFT      PSV cm/sRatioStenosis       Waveform Comments            +----------+--------+-----+---------------+---------+-------------------+ CFA Prox  154                         triphasic                    +----------+--------+-----+---------------+---------+-------------------+ CFA Distal183                         triphasic                    +----------+--------+-----+---------------+---------+-------------------+ DFA       107                         triphasic                    +----------+--------+-----+---------------+---------+-------------------+ SFA Prox  83                          triphasiccalcific plaque M-d +----------+--------+-----+---------------+---------+-------------------+ SFA Mid   81 / 281     50-74% stenosistriphasic                    +----------+--------+-----+---------------+---------+-------------------+ SFA Distal102                         triphasic                    +----------+--------+-----+---------------+---------+-------------------+   Carotid: Right Carotid: Velocities in the right ICA are  consistent with a 60-79%                stenosis. The ECA appears >50% stenosed.  Left Carotid: Velocities in the left ICA are consistent with a 40-59% stenosis,               calcific plaque may obscure higher velocity. The ECA appears >50%               stenosed.  MEDICAL ISSUES:   Carotid: He remains asymptomatic.  He will follow-up in 6 months with repeat ultrasound  PAD: He does have a increase in velocity within his left stent.  This does not meet criteria for intervention.  There has not been a significant drop in his ABI.  This  will be evaluated again in 6 months with repeat ultrasound.    Leia Alf, MD, FACS [facility] [phone] Pager [phone]

## 2019-05-08 ENCOUNTER — Other Ambulatory Visit: Payer: Self-pay | Admitting: *Deleted

## 2019-05-08 DIAGNOSIS — I6523 Occlusion and stenosis of bilateral carotid arteries: Secondary | ICD-10-CM

## 2019-05-08 DIAGNOSIS — I739 Peripheral vascular disease, unspecified: Secondary | ICD-10-CM

## 2019-05-08 DIAGNOSIS — I70213 Atherosclerosis of native arteries of extremities with intermittent claudication, bilateral legs: Secondary | ICD-10-CM

## 2019-05-20 ENCOUNTER — Other Ambulatory Visit: Payer: Self-pay | Admitting: Infectious Disease

## 2019-05-20 DIAGNOSIS — B20 Human immunodeficiency virus [HIV] disease: Secondary | ICD-10-CM

## 2019-05-27 ENCOUNTER — Other Ambulatory Visit: Payer: Self-pay | Admitting: *Deleted

## 2019-05-27 DIAGNOSIS — E7800 Pure hypercholesterolemia, unspecified: Secondary | ICD-10-CM

## 2019-05-28 MED ORDER — ATORVASTATIN CALCIUM 40 MG PO TABS: ORAL_TABLET | ORAL | 1 refills | Status: DC

## 2019-06-09 ENCOUNTER — Encounter: Payer: Self-pay | Admitting: Dietician

## 2019-06-09 ENCOUNTER — Other Ambulatory Visit: Payer: Self-pay | Admitting: Dietician

## 2019-06-09 ENCOUNTER — Ambulatory Visit (INDEPENDENT_AMBULATORY_CARE_PROVIDER_SITE_OTHER): Payer: Medicare Other | Admitting: Internal Medicine

## 2019-06-09 ENCOUNTER — Encounter: Payer: Self-pay | Admitting: Internal Medicine

## 2019-06-09 ENCOUNTER — Other Ambulatory Visit: Payer: Self-pay

## 2019-06-09 ENCOUNTER — Ambulatory Visit: Payer: Medicare Other | Admitting: Dietician

## 2019-06-09 VITALS — BP 140/59 | HR 68 | Temp 97.5°F | Ht 73.0 in | Wt 181.1 lb

## 2019-06-09 DIAGNOSIS — Z794 Long term (current) use of insulin: Secondary | ICD-10-CM | POA: Diagnosis not present

## 2019-06-09 DIAGNOSIS — I10 Essential (primary) hypertension: Secondary | ICD-10-CM | POA: Diagnosis not present

## 2019-06-09 DIAGNOSIS — E785 Hyperlipidemia, unspecified: Secondary | ICD-10-CM

## 2019-06-09 DIAGNOSIS — E089 Diabetes mellitus due to underlying condition without complications: Secondary | ICD-10-CM

## 2019-06-09 DIAGNOSIS — E1169 Type 2 diabetes mellitus with other specified complication: Secondary | ICD-10-CM

## 2019-06-09 DIAGNOSIS — I739 Peripheral vascular disease, unspecified: Secondary | ICD-10-CM

## 2019-06-09 LAB — POCT GLYCOSYLATED HEMOGLOBIN (HGB A1C): Hemoglobin A1C: 8.2 % — AB (ref 4.0–5.6)

## 2019-06-09 LAB — GLUCOSE, CAPILLARY: Glucose-Capillary: 257 mg/dL — ABNORMAL HIGH (ref 70–99)

## 2019-06-09 LAB — HM DIABETES EYE EXAM

## 2019-06-09 MED ORDER — LISINOPRIL 40 MG PO TABS: 40.0000 mg | ORAL_TABLET | Freq: Every day | ORAL | 1 refills | Status: DC

## 2019-06-09 NOTE — Progress Notes (Signed)
Internal Medicine Clinic Attending  Case discussed with Dr. Santos-Sanchez at the time of the visit.  We reviewed the resident's history and exam and pertinent patient test results.  I agree with the assessment, diagnosis, and plan of care documented in the resident's note.    

## 2019-06-09 NOTE — Assessment & Plan Note (Addendum)
Above goal after change in dose at [facility] from 40--> 30 mg. Will increase to 40 mg daily. Refilled provided.

## 2019-06-09 NOTE — Progress Notes (Signed)
Retinal images were done and transmitted today.  

## 2019-06-09 NOTE — Assessment & Plan Note (Addendum)
Compliant with plavix and high intensity statin. Follows up with Dr. Trula Slade. Recently seen in 04/2019 at which time he had increased velocities in stent on LLE but no indication for intervention. No significant changes in ABI. He is to follow up in 6 mo for repeat US.

## 2019-06-09 NOTE — Progress Notes (Signed)
   CC: HTN and T2DM follow up   HPI:  Nathan [name] is a 69 y.o. year-old male with PMH listed below who presents to clinic for HTN and T2DM follow up. Please see problem based assessment and plan for further details.   Past Medical History:  Diagnosis Date  . Asthma    per 2003 [facility] pulm records pfts   . Blood dyscrasia    HIV  . Clotting disorder ([place])   . Colon polyps    noted previous colonoscopy [facility]  . DDD (degenerative disc disease)    cervical spine  . Depression   . Diabetes mellitus    dx 2010  . Fever    unknwon origin  . GERD (gastroesophageal reflux disease)   . Head swelling 05/23/2017  . Hep C w/o coma, chronic ([place])   . History of syphilis    noted [facility] records  . HIV infection ([place])    undetectable viral load and CD4 ct 667 as of 11/2011  . Hyperlipidemia   . Hypertension   . MRSA (methicillin resistant Staphylococcus aureus)   . Multiple sclerosis ([place])    in remission as of 11/2011 (diagnosed late 1980s)  . Pain in limb-Right Leg 10/13/2013  . 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[place])    2002  . Pneumonia   . Prosthesis fitting 03/10/2019  . PVD (peripheral vascular disease) ([place])    Left Stent 07/02/2008  . Scalp lesion 07/26/2017  . UTI (urinary tract infection)   . Wears dentures    Review of Systems:   Review of Systems  Constitutional: Negative for chills, fever, malaise/fatigue and weight loss.  Cardiovascular: Negative for chest pain, palpitations and leg swelling.  Gastrointestinal: Negative for abdominal pain, constipation, diarrhea, nausea and vomiting.  Genitourinary: Negative for dysuria, frequency and urgency.  Neurological: Negative for dizziness and headaches.    Physical Exam:  Vitals:   06/09/19 1023  Weight: 181 lb 1.6 oz (82.1 kg)  Height: 6\' 1"  (1.854 m)    General: well-appearing male in NAD  Cardiac: regular rate and rhythm, nl S1/S2, no murmurs, rubs or gallops Pulm: CTAB, no wheezes or crackles, no increased  work of breathing on room air  Ext: warm and well perfused, s/p RLE BKa, no peripheral edema, DP and TB pulses chronically absent on L    Assessment & Plan:   See Encounters Tab for problem based charting.  Patient discussed with Dr. Philipp Ovens

## 2019-06-09 NOTE — Assessment & Plan Note (Addendum)
Compliant with high intensity statin. LDL 71 in 2019. Continue atorva 40 mg QD.

## 2019-06-09 NOTE — Assessment & Plan Note (Addendum)
On Toujeo 70 units, Lispro 22 units TID w/ meals, Victoza 1.8 mg QD, and pioglitazone. He reports adherence. A1c 6.8 12/2018 --> 8.2 today. Bg meter download shows readings 200-30ss with highest one at 399. No hypoglycemia.  Main issue at this time is adherence to low carb diet. Reports having a "sweet tooth" and likes to snack on candy and baked goods. Will adjust DM regimen as below.  - Increase Toujeo to 75 units - Increase Lispro to 24 units TID with meals  - Continue Victoza and pioglitazone - Continue lisinopril 30 mg QD and atorva 409 mg QD - Foot exam performed today  - Eye exam with [name] today

## 2019-06-09 NOTE — Patient Instructions (Addendum)
Mr. Guy,   I sent a refill for lisinopril to your pharmacy. This is for 40 milligrams. STOP taking the 30 mg tablet.   Your sugars are too high, we will make the following changes to your insulin regimen:  - Increase Toujeo to 75 units  - Increase Humalog to 24 units  - Continue Victoza and pioglitazone as usual  Make a follow up appointment with me in 3 months. Call us if you have any questions or concerns.   - Dr. Frederico Hamman

## 2019-06-10 ENCOUNTER — Other Ambulatory Visit: Payer: Self-pay

## 2019-06-10 ENCOUNTER — Other Ambulatory Visit: Payer: Medicare Other

## 2019-06-10 DIAGNOSIS — Z449 Encounter for fitting and adjustment of unspecified external prosthetic device: Secondary | ICD-10-CM

## 2019-06-10 DIAGNOSIS — Z89611 Acquired absence of right leg above knee: Secondary | ICD-10-CM | POA: Diagnosis not present

## 2019-06-10 DIAGNOSIS — B20 Human immunodeficiency virus [HIV] disease: Secondary | ICD-10-CM | POA: Diagnosis not present

## 2019-06-10 DIAGNOSIS — Z794 Long term (current) use of insulin: Secondary | ICD-10-CM | POA: Diagnosis not present

## 2019-06-10 DIAGNOSIS — E1169 Type 2 diabetes mellitus with other specified complication: Secondary | ICD-10-CM | POA: Diagnosis not present

## 2019-06-10 DIAGNOSIS — I70213 Atherosclerosis of native arteries of extremities with intermittent claudication, bilateral legs: Secondary | ICD-10-CM

## 2019-06-10 DIAGNOSIS — L0292 Furuncle, unspecified: Secondary | ICD-10-CM

## 2019-06-11 LAB — T-HELPER CELL (CD4) - (RCID CLINIC ONLY)
CD4 % Helper T Cell: 18 % — ABNORMAL LOW (ref 33–65)
CD4 T Cell Abs: 608 /uL (ref 400–1790)

## 2019-06-13 ENCOUNTER — Encounter: Payer: Self-pay | Admitting: Dietician

## 2019-06-13 ENCOUNTER — Encounter (INDEPENDENT_AMBULATORY_CARE_PROVIDER_SITE_OTHER): Payer: Medicare Other | Admitting: Dietician

## 2019-06-13 DIAGNOSIS — E119 Type 2 diabetes mellitus without complications: Secondary | ICD-10-CM | POA: Diagnosis not present

## 2019-06-13 DIAGNOSIS — E1169 Type 2 diabetes mellitus with other specified complication: Secondary | ICD-10-CM | POA: Diagnosis not present

## 2019-06-13 DIAGNOSIS — Z794 Long term (current) use of insulin: Secondary | ICD-10-CM | POA: Diagnosis not present

## 2019-06-13 LAB — CBC WITH DIFFERENTIAL/PLATELET
Absolute Monocytes: 323 cells/uL (ref 200–950)
Basophils Absolute: 32 cells/uL (ref 0–200)
Basophils Relative: 0.6 %
Eosinophils Absolute: 143 cells/uL (ref 15–500)
Eosinophils Relative: 2.7 %
HCT: 37.8 % — ABNORMAL LOW (ref 38.5–50.0)
Hemoglobin: 13.3 g/dL (ref 13.2–17.1)
Lymphs Abs: 3360 cells/uL (ref 850–3900)
MCH: 39.2 pg — ABNORMAL HIGH (ref 27.0–33.0)
MCHC: 35.2 g/dL (ref 32.0–36.0)
MCV: 111.5 fL — ABNORMAL HIGH (ref 80.0–100.0)
MPV: 9.3 fL (ref 7.5–12.5)
Monocytes Relative: 6.1 %
Neutro Abs: 1442 cells/uL — ABNORMAL LOW (ref 1500–7800)
Neutrophils Relative %: 27.2 %
Platelets: 207 10*3/uL (ref 140–400)
RBC: 3.39 10*6/uL — ABNORMAL LOW (ref 4.20–5.80)
RDW: 12.8 % (ref 11.0–15.0)
Total Lymphocyte: 63.4 %
WBC: 5.3 10*3/uL (ref 3.8–10.8)

## 2019-06-13 LAB — COMPLETE METABOLIC PANEL WITH GFR
AG Ratio: 1.6 (calc) (ref 1.0–2.5)
ALT: 20 U/L (ref 9–46)
AST: 21 U/L (ref 10–35)
Albumin: 4.3 g/dL (ref 3.6–5.1)
Alkaline phosphatase (APISO): 65 U/L (ref 35–144)
BUN: 9 mg/dL (ref 7–25)
CO2: 26 mmol/L (ref 20–32)
Calcium: 9.7 mg/dL (ref 8.6–10.3)
Chloride: 102 mmol/L (ref 98–110)
Creat: 0.91 mg/dL (ref 0.70–1.18)
GFR, Est African American: 99 mL/min/{1.73_m2} (ref >=60)
GFR, Est Non African American: 85 mL/min/{1.73_m2} (ref >=60)
Globulin: 2.7 g/dL (calc) (ref 1.9–3.7)
Glucose, Bld: 264 mg/dL — ABNORMAL HIGH (ref 65–99)
Potassium: 4.3 mmol/L (ref 3.5–5.3)
Sodium: 137 mmol/L (ref 135–146)
Total Bilirubin: 0.6 mg/dL (ref 0.2–1.2)
Total Protein: 7 g/dL (ref 6.1–8.1)

## 2019-06-13 LAB — RPR: RPR Ser Ql: REACTIVE — AB

## 2019-06-13 LAB — HIV-1 RNA QUANT-NO REFLEX-BLD
HIV 1 RNA Quant: <20 copies/mL
HIV-1 RNA Quant, Log: <1.3 Log copies/mL

## 2019-06-13 LAB — FLUORESCENT TREPONEMAL AB(FTA)-IGG-BLD: Fluorescent Treponemal ABS: REACTIVE — AB

## 2019-06-13 LAB — RPR TITER: RPR Titer: 1:8 {titer} — ABNORMAL HIGH

## 2019-06-17 ENCOUNTER — Other Ambulatory Visit: Payer: Self-pay | Admitting: *Deleted

## 2019-06-17 DIAGNOSIS — IMO0002 Reserved for concepts with insufficient information to code with codable children: Secondary | ICD-10-CM

## 2019-06-17 DIAGNOSIS — E1142 Type 2 diabetes mellitus with diabetic polyneuropathy: Secondary | ICD-10-CM

## 2019-06-17 MED ORDER — CONTOUR NEXT TEST VI STRP: 1.0000 | ORAL_STRIP | Freq: Four times a day (QID) | 1 refills | Status: DC

## 2019-06-25 ENCOUNTER — Encounter: Payer: Medicare Other | Admitting: Infectious Disease

## 2019-06-30 ENCOUNTER — Ambulatory Visit (INDEPENDENT_AMBULATORY_CARE_PROVIDER_SITE_OTHER): Payer: Medicare Other | Admitting: Infectious Disease

## 2019-06-30 ENCOUNTER — Encounter: Payer: Self-pay | Admitting: Infectious Disease

## 2019-06-30 ENCOUNTER — Other Ambulatory Visit: Payer: Self-pay

## 2019-06-30 VITALS — BP 161/69 | HR 76 | Temp 97.5°F | Wt 183.0 lb

## 2019-06-30 DIAGNOSIS — I70213 Atherosclerosis of native arteries of extremities with intermittent claudication, bilateral legs: Secondary | ICD-10-CM | POA: Diagnosis not present

## 2019-06-30 DIAGNOSIS — I10 Essential (primary) hypertension: Secondary | ICD-10-CM

## 2019-06-30 DIAGNOSIS — Z8619 Personal history of other infectious and parasitic diseases: Secondary | ICD-10-CM

## 2019-06-30 DIAGNOSIS — B20 Human immunodeficiency virus [HIV] disease: Secondary | ICD-10-CM

## 2019-06-30 DIAGNOSIS — E1169 Type 2 diabetes mellitus with other specified complication: Secondary | ICD-10-CM

## 2019-06-30 DIAGNOSIS — E785 Hyperlipidemia, unspecified: Secondary | ICD-10-CM

## 2019-06-30 DIAGNOSIS — Z89611 Acquired absence of right leg above knee: Secondary | ICD-10-CM | POA: Diagnosis not present

## 2019-06-30 DIAGNOSIS — Z794 Long term (current) use of insulin: Secondary | ICD-10-CM

## 2019-06-30 NOTE — Progress Notes (Signed)
Chief complaint: followup for HIV on meds,without any new complaints  Subjective:    Patient ID: Nathan [name], male    DOB: Dec 28, 1950, 69 y.o.   MRN: [id number]  HPI  Nathan [name] is a 69 y.o. male whose HIV has been perfectly suppressed on Biktarvy and BID AZT  We ran [employer] which is shown below:       We have treated [place] for syphilis yet again and titers have not gone down yet by fourfold though I think it may be early for him   Past Medical History:  Diagnosis Date  . Asthma    per 2003 [facility] pulm records pfts   . Blood dyscrasia    HIV  . Clotting disorder ([place])   . Colon polyps    noted previous colonoscopy [facility]  . DDD (degenerative disc disease)    cervical spine  . Depression   . Diabetes mellitus    dx 2010  . Fever    unknwon origin  . GERD (gastroesophageal reflux disease)   . Head swelling 05/23/2017  . Hep C w/o coma, chronic ([place])   . History of syphilis    noted [facility] records  . HIV infection ([place])    undetectable viral load and CD4 ct 667 as of 11/2011  . Hyperlipidemia   . Hypertension   . MRSA (methicillin resistant Staphylococcus aureus)   . Multiple sclerosis ([place])    in remission as of 11/2011 (diagnosed late 1980s)  . Pain in limb-Right Leg 10/13/2013  . 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[place])    2002  . Pneumonia   . Prosthesis fitting 03/10/2019  . PVD (peripheral vascular disease) ([place])    Left Stent 07/02/2008  . Scalp lesion 07/26/2017  . UTI (urinary tract infection)   . Wears dentures     Past Surgical History:  Procedure Laterality Date  . ABDOMINAL AORTOGRAM W/LOWER EXTREMITY N/A 08/07/2017   Procedure: ABDOMINAL AORTOGRAM W/LOWER EXTREMITY;  Surgeon: Serafina Mitchell, MD;  Location: [place] [facility];  Service: Cardiovascular;  Laterality: N/A;  . ABOVE KNEE LEG AMPUTATION     Right Leg  . COLONOSCOPY W/ BIOPSIES AND POLYPECTOMY    . LOWER EXTREMITY ANGIOGRAM Left 12/26/2011   Procedure: LOWER EXTREMITY  ANGIOGRAM;  Surgeon: Serafina Mitchell, MD;  Location: [facility] CATH LAB;  Service: Cardiovascular;  Laterality: Left;  . LOWER EXTREMITY ANGIOGRAM Left 08/17/2017   Procedure: LOWER EXTREMITY ANGIOGRAM LEFT LEG WITH RUNOFF AND Stenting.;  Surgeon: Serafina Mitchell, MD;  Location: [place];  Service: Vascular;  Laterality: Left;  Marland Kitchen MULTIPLE TOOTH EXTRACTIONS    . OTHER SURGICAL HISTORY     right lower ext AKA with prothesis   . OTHER SURGICAL HISTORY     left left with stents (Dr. Seward Speck)  . OTHER SURGICAL HISTORY     2003 colonoscopy 5 mm polyp transverse colon; (2)65m  polyps in rectum-hyperplastic  . PERIPHERAL VASCULAR INTERVENTION Left 08/17/2017   Procedure: POPLITEAL STENT;  Surgeon: BSerafina Mitchell MD;  [facility];  Service: Vascular;  Laterality: Left;    Family History  Problem Relation Age of Onset  . Diabetes Mother   . Coronary artery disease Mother   . Cancer Brother        colon caner stage 4 as of 11/2011 (unknown age of onset)  . Coronary artery disease Father   . Prostate cancer Brother   . Colon cancer Brother   . Rectal cancer Neg Hx  Social History   Socioeconomic History  . Marital status: Single    Spouse name: Not on file  . Number of children: Not on file  . Years of education: 72  . Highest education level: Not on file  Occupational History  . Not on file  Tobacco Use  . Smoking status: Former Smoker    Quit date: 05/09/2007    Years since quitting: 12.1  . Smokeless tobacco: Former Systems developer    Types: Chew  Substance and Sexual Activity  . Alcohol use: No    Alcohol/week: 0.0 standard drinks  . Drug use: No  . Sexual activity: Yes    Partners: Male    Comment: declined condoms  Other Topics Concern  . Not on file  Social History Narrative  . Not on file   Social Determinants of Health   Financial Resource Strain:   . Difficulty of Paying Living Expenses: Not on file  Food Insecurity:   . Worried About Charity fundraiser in the  Last Year: Not on file  . Ran Out of Food in the Last Year: Not on file  Transportation Needs:   . Lack of Transportation (Medical): Not on file  . Lack of Transportation (Non-Medical): Not on file  Physical Activity:   . Days of Exercise per Week: Not on file  . Minutes of Exercise per Session: Not on file  Stress:   . Feeling of Stress : Not on file  Social Connections:   . Frequency of Communication with Friends and Family: Not on file  . Frequency of Social Gatherings with Friends and Family: Not on file  . Attends Religious Services: Not on file  . Active Member of Clubs or Organizations: Not on file  . Attends Archivist Meetings: Not on file  . Marital Status: Not on file    Allergies  Allergen Reactions  . Sulfonamide Derivatives Hives     Current Outpatient Medications:  .  atorvastatin (LIPITOR) 40 MG tablet, TAKE 1 TABLET(40 MG) BY MOUTH DAILY, Disp: 90 tablet, Rfl: 1 .  BIKTARVY 50-200-25 MG TABS tablet, TAKE 1 TABLET BY MOUTH DAILY., Disp: 30 tablet, Rfl: 1 .  Blood Glucose Monitoring Suppl (BAYER CONTOUR MONITOR) W/DEVICE KIT, Use to check blood sugar as instructed up to 4 times a day, Disp: 1 kit, Rfl: 0 .  clopidogrel (PLAVIX) 75 MG tablet, Take 1 tablet (75 mg total) by mouth daily., Disp: 90 tablet, Rfl: 3 .  glucose blood (CONTOUR NEXT TEST) test strip, 1 each by Other route 4 (four) times daily. Use 4 times daily to check blood sugar. diag code E11.42., Disp: 375 each, Rfl: 1 .  Insulin Glargine, 2 Unit Dial, (TOUJEO MAX SOLOSTAR) 300 UNIT/ML SOPN, Inject 70 Units into the skin at bedtime., Disp: 5 pen, Rfl: 3 .  Insulin Lispro Junior KwikPen 100 UNIT/ML SOPN, Inject 24 Units into the skin 3 (three) times daily. For DM, Disp: 15 mL, Rfl: 3 .  Insulin Pen Needle (BD PEN NEEDLE NANO U/F) 32G X 4 MM MISC, USE AS DIRECTED FOUR TIMES DAILY. Dx code  E11.42, Disp: 200 each, Rfl: 5 .  Lancet Devices (BAYER MICROLET 2 LANCING DEVIC) MISC, Use to check blood  sugar up to 3 ties a day, Disp: 1 each, Rfl: 2 .  liraglutide (VICTOZA) 18 MG/3ML SOPN, ADMINISTER 1.8 MG UNDER THE SKIN EVERY MORNING, Disp: 9 mL, Rfl: 6 .  lisinopril (ZESTRIL) 40 MG tablet, Take 1 tablet (40 mg total) by  mouth daily. Hold if systolic BP <518, Disp: 90 tablet, Rfl: 1 .  omeprazole (PRILOSEC) 40 MG capsule, Take 1 capsule (40 mg total) by mouth daily., Disp: 90 capsule, Rfl: 0 .  zidovudine (RETROVIR) 300 MG tablet, TAKE 1 TABLET(300 MG) BY MOUTH TWICE DAILY, Disp: 60 tablet, Rfl: 5  Review of Systems  Constitutional: Negative for activity change, appetite change, chills, diaphoresis and unexpected weight change.  HENT: Negative for sinus pressure, sneezing and trouble swallowing.   Eyes: Negative for photophobia and visual disturbance.  Respiratory: Negative for chest tightness and stridor.   Cardiovascular: Negative for palpitations and leg swelling.  Gastrointestinal: Negative for abdominal distention, anal bleeding, blood in stool and constipation.  Genitourinary: Negative for difficulty urinating, dysuria, flank pain and hematuria.  Musculoskeletal: Negative for back pain, gait problem and joint swelling.  Skin: Positive for wound. Negative for color change and pallor.  Neurological: Negative for dizziness, tremors, weakness and light-headedness.  Hematological: Negative for adenopathy. Does not bruise/bleed easily.  Psychiatric/Behavioral: Negative for agitation, behavioral problems, confusion, decreased concentration, dysphoric mood and sleep disturbance.       Objective:   Physical Exam  Constitutional: He is oriented to person, place, and time. He appears well-developed and well-nourished. No distress.  HENT:  Head: Atraumatic.  Mouth/Throat: Oropharynx is clear and moist. No oropharyngeal exudate.  Eyes: Conjunctivae and EOM are normal. No scleral icterus.  Cardiovascular: Normal rate and regular rhythm.  Pulmonary/Chest: Effort normal. No respiratory  distress. He has no wheezes.  Abdominal: He exhibits no distension.  Musculoskeletal:        General: No tenderness or edema.     Cervical back: Normal range of motion and neck supple.  Neurological: He is alert and oriented to person, place, and time.  Skin: Skin is warm and dry. He is not diaphoretic. No erythema. No pallor.  Psychiatric: He has a normal mood and affect. His behavior is normal. Judgment and thought content normal.  Nursing note and vitals reviewed.      Assessment & Plan:    HIV: highly R virus:   Continue Biktarvy plus twice daily AZT    Syphilis: Recheck titers when he comes back in the summertime    History of below the knee amputations bilaterally with 1 t he has had this problem fixed  DM: followed closely in IM clinic A1c has been up recently  PVD by vascular surgery and no need for interventions there is see him again in 6 months  HTN: followed by PCP pressure up in our clinic. Vitals:   06/30/19 0942  BP: (!) 161/69  Pulse: 76  Temp: (!) 97.5 F (36.4 C)

## 2019-07-04 ENCOUNTER — Ambulatory Visit: Payer: Medicare Other | Attending: Internal Medicine

## 2019-07-04 DIAGNOSIS — Z23 Encounter for immunization: Secondary | ICD-10-CM | POA: Insufficient documentation

## 2019-07-04 NOTE — Progress Notes (Signed)
   Covid-19 Vaccination Clinic  Name:  [name]    MRN: [id number] DOB: 02/20/1951  07/04/2019  [name] was observed post Covid-19 immunization for 15 minutes without incidence. He was provided with Vaccine Information Sheet and instruction to access the V-Safe system.   [name] was instructed to call 911 with any severe reactions post vaccine: Marland Kitchen Difficulty breathing  . Swelling of your face and throat  . A fast heartbeat  . A bad rash all over your body  . Dizziness and weakness    Immunizations Administered    Name Date Dose VIS Date Route   Pfizer COVID-19 Vaccine 07/04/2019  9:07 AM 0.3 mL 04/18/2019 Intramuscular   Manufacturer: [place]   Lot: [id number]   [place]: [id number]

## 2019-07-29 ENCOUNTER — Ambulatory Visit: Payer: Medicare Other | Attending: Internal Medicine

## 2019-07-29 DIAGNOSIS — Z23 Encounter for immunization: Secondary | ICD-10-CM

## 2019-07-29 NOTE — Progress Notes (Signed)
   Covid-19 Vaccination Clinic  Name:  Nathan [name]    MRN: [id number] DOB: 07/30/1950  07/29/2019  [name] was observed post Covid-19 immunization for 15 minutes without incident. He was provided with Vaccine Information Sheet and instruction to access the V-Safe system.   [name] was instructed to call 911 with any severe reactions post vaccine: Marland Kitchen Difficulty breathing  . Swelling of face and throat  . A fast heartbeat  . A bad rash all over body  . Dizziness and weakness   Immunizations Administered    Name Date Dose VIS Date Route   Pfizer COVID-19 Vaccine 07/29/2019 10:42 AM 0.3 mL 04/18/2019 Intramuscular   Manufacturer: [place]   Lot: [phone] [id number]   [place]: [id number]

## 2019-08-06 ENCOUNTER — Other Ambulatory Visit: Payer: Self-pay

## 2019-08-06 DIAGNOSIS — Z794 Long term (current) use of insulin: Secondary | ICD-10-CM

## 2019-08-06 DIAGNOSIS — E1169 Type 2 diabetes mellitus with other specified complication: Secondary | ICD-10-CM

## 2019-08-06 MED ORDER — TOUJEO MAX SOLOSTAR 300 UNIT/ML ~~LOC~~ SOPN: 70.0000 [IU] | PEN_INJECTOR | Freq: Every day | SUBCUTANEOUS | 3 refills | Status: DC

## 2019-08-06 NOTE — Telephone Encounter (Signed)
Insulin Glargine, 2 Unit Dial, (TOUJEO MAX SOLOSTAR) 300 UNIT/ML SOPN, and Humalog to be filled @  [facility] [id number] - [place], [address] [place] [phone] (Phone) [phone] (Fax)

## 2019-08-12 ENCOUNTER — Other Ambulatory Visit: Payer: Self-pay

## 2019-08-12 DIAGNOSIS — B20 Human immunodeficiency virus [HIV] disease: Secondary | ICD-10-CM

## 2019-08-12 MED ORDER — BIKTARVY 50-200-25 MG PO TABS: 1.0000 | ORAL_TABLET | Freq: Every day | ORAL | 5 refills | Status: DC

## 2019-08-12 MED ORDER — ZIDOVUDINE 300 MG PO TABS: ORAL_TABLET | ORAL | 5 refills | Status: DC

## 2019-08-26 ENCOUNTER — Other Ambulatory Visit: Payer: Self-pay | Admitting: *Deleted

## 2019-08-26 DIAGNOSIS — K219 Gastro-esophageal reflux disease without esophagitis: Secondary | ICD-10-CM

## 2019-08-26 MED ORDER — OMEPRAZOLE 40 MG PO CPDR: 40.0000 mg | DELAYED_RELEASE_CAPSULE | Freq: Every day | ORAL | 0 refills | Status: DC

## 2019-08-26 NOTE — Telephone Encounter (Signed)
Next appt scheduled 5/10 with PCP.

## 2019-09-01 ENCOUNTER — Other Ambulatory Visit: Payer: Self-pay | Admitting: *Deleted

## 2019-09-01 DIAGNOSIS — E1169 Type 2 diabetes mellitus with other specified complication: Secondary | ICD-10-CM

## 2019-09-01 DIAGNOSIS — Z794 Long term (current) use of insulin: Secondary | ICD-10-CM

## 2019-09-01 MED ORDER — VICTOZA 18 MG/3ML ~~LOC~~ SOPN: PEN_INJECTOR | SUBCUTANEOUS | 6 refills | Status: DC

## 2019-09-01 NOTE — Telephone Encounter (Signed)
Next appt scheduled 5/10 with PCP.

## 2019-09-02 ENCOUNTER — Encounter: Payer: Self-pay | Admitting: *Deleted

## 2019-09-05 ENCOUNTER — Telehealth: Payer: Self-pay | Admitting: *Deleted

## 2019-09-05 NOTE — Telephone Encounter (Signed)
Received order form for diabetic shoes from [facility]. Placed in PCP's box for completion and signatures. Diabetes management discussed at [place] on 06/09/2019 and foot exam performed at that time. [name], BSN, RN-BC

## 2019-09-06 NOTE — Telephone Encounter (Signed)
Thanks for letting me know! I will be out of town until May 9th. If this needs to be signed sooner than that, please ask one of the attendings. If not, happy to sign when I get back.

## 2019-09-15 ENCOUNTER — Ambulatory Visit (INDEPENDENT_AMBULATORY_CARE_PROVIDER_SITE_OTHER): Payer: Medicare Other | Admitting: Internal Medicine

## 2019-09-15 ENCOUNTER — Other Ambulatory Visit: Payer: Self-pay

## 2019-09-15 DIAGNOSIS — E089 Diabetes mellitus due to underlying condition without complications: Secondary | ICD-10-CM

## 2019-09-15 DIAGNOSIS — Z794 Long term (current) use of insulin: Secondary | ICD-10-CM | POA: Diagnosis not present

## 2019-09-15 DIAGNOSIS — E1169 Type 2 diabetes mellitus with other specified complication: Secondary | ICD-10-CM | POA: Diagnosis not present

## 2019-09-15 DIAGNOSIS — I10 Essential (primary) hypertension: Secondary | ICD-10-CM

## 2019-09-15 DIAGNOSIS — E785 Hyperlipidemia, unspecified: Secondary | ICD-10-CM

## 2019-09-15 LAB — GLUCOSE, CAPILLARY: Glucose-Capillary: 253 mg/dL — ABNORMAL HIGH (ref 70–99)

## 2019-09-15 LAB — POCT GLYCOSYLATED HEMOGLOBIN (HGB A1C): Hemoglobin A1C: 7.9 % — AB (ref 4.0–5.6)

## 2019-09-15 MED ORDER — INSULIN LISPRO JUNIOR KWIKPEN 100 UNIT/ML ~~LOC~~ SOPN: 25.0000 [IU] | PEN_INJECTOR | Freq: Three times a day (TID) | SUBCUTANEOUS | 3 refills | Status: DC

## 2019-09-15 MED ORDER — VICTOZA 18 MG/3ML ~~LOC~~ SOPN: PEN_INJECTOR | SUBCUTANEOUS | 6 refills | Status: DC

## 2019-09-15 MED ORDER — TOUJEO MAX SOLOSTAR 300 UNIT/ML ~~LOC~~ SOPN: 75.0000 [IU] | PEN_INJECTOR | Freq: Every day | SUBCUTANEOUS | 3 refills | Status: DC

## 2019-09-15 NOTE — Patient Instructions (Signed)
Nathan [name],   Continue taking your medications as usual. Come back to see [place] in 3 months.   I filled out the disability parking placard applications.   -- Dr. Frederico Hamman

## 2019-09-16 ENCOUNTER — Telehealth: Payer: Self-pay

## 2019-09-16 NOTE — Telephone Encounter (Signed)
RX clarification:  Received TC from [employer], Jenny Reichmann.  States they received a RX for Insulin Lispro Junior KwikPen yesterday, but patient has been on Plain Humalog in the past.  Wants to make sure this was intentional and not selected in error. Will forward to PCP to advise and resend if needed. SChaplin, RN,BSN

## 2019-09-17 ENCOUNTER — Encounter: Payer: Self-pay | Admitting: Internal Medicine

## 2019-09-17 MED ORDER — HUMALOG KWIKPEN 200 UNIT/ML ~~LOC~~ SOPN: 25.0000 [IU] | PEN_INJECTOR | Freq: Three times a day (TID) | SUBCUTANEOUS | 3 refills | Status: DC

## 2019-09-17 NOTE — Assessment & Plan Note (Signed)
Patient presents for follow-up of diabetes.  He was seen for this 3 months ago at which time his Toujeo and Humalog were increased due to persistent hyperglycemia throughout the day.  He has noticed improved glycemic control on this regimen.  BG meter download shows his BG is on target more frequently, but continues to have hyperglycemia throughout the day.  No hypoglycemia, but lowest BG 87 at noon. We discussed again adhering to a as this is his main barrier for optimal glycemic control.  I will not adjust his regimen further due to concern for possible hypoglycemic events.  - Continue current regimen - Up-to-date with foot and eye exams

## 2019-09-17 NOTE — Assessment & Plan Note (Signed)
Continue high intensity atorvastatin 40 mg daily.

## 2019-09-17 NOTE — Progress Notes (Signed)
   CC: Diabetes follow up  HPI:  [name] is a 69 y.o. year-old male with PMH listed below who presents to clinic for diabetes follo wup. Please see problem based assessment and plan for further details.   Past Medical History:  Diagnosis Date  . Asthma    per 2003 [facility] pulm records pfts   . Blood dyscrasia    HIV  . Clotting disorder ([place])   . Colon polyps    noted previous colonoscopy [facility]  . DDD (degenerative disc disease)    cervical spine  . Depression   . Diabetes mellitus    dx 2010  . Fever    unknwon origin  . GERD (gastroesophageal reflux disease)   . Head swelling 05/23/2017  . Hep C w/o coma, chronic ([place])   . History of syphilis    noted [facility] records  . HIV infection ([place])    undetectable viral load and CD4 ct 667 as of 11/2011  . Hyperlipidemia   . Hypertension   . MRSA (methicillin resistant Staphylococcus aureus)   . Multiple sclerosis ([place])    in remission as of 11/2011 (diagnosed late 1980s)  . Pain in limb-Right Leg 10/13/2013  . 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[place])    2002  . Pneumonia   . Prosthesis fitting 03/10/2019  . PVD (peripheral vascular disease) ([place])    Left Stent 07/02/2008  . Scalp lesion 07/26/2017  . UTI (urinary tract infection)   . Wears dentures    Review of Systems:   Review of Systems  Constitutional: Negative for chills, fever and weight loss.  Respiratory: Negative for shortness of breath.   Cardiovascular: Negative for chest pain.  Gastrointestinal: Negative for abdominal pain, nausea and vomiting.  Genitourinary: Negative for dysuria and urgency.  Neurological: Negative for dizziness and headaches.    Physical Exam:  VS taken but not recorded in EMR.   General: well-appearing male in NAD  Cardiac: regular rate and rhythm, nl S1/S2, no murmurs, rubs or gallops  Pulm: CTAB, no wheezes or crackles, no increased work of breathing on room air  Ext: warm and well perfused, no peripheral  edema    Assessment & Plan:   See Encounters Tab for problem based charting.  Patient discussed with Dr. Rebeca Alert

## 2019-09-17 NOTE — Telephone Encounter (Signed)
I did not noticed that, I refilled the one in his medication list. I have cancelled the prescription and sent one for Humalog.

## 2019-09-17 NOTE — Assessment & Plan Note (Signed)
Well-controlled after increase in lisinopril from 30 to 40 mg daily.  Recent blood work at  [facility] shows normal electrolytes and renal function.  Will continue.

## 2019-09-17 NOTE — Telephone Encounter (Signed)
Signed order for diabetic shoes faxed to [facility] at [phone]. [name], BSN, RN-BC

## 2019-09-18 NOTE — Progress Notes (Signed)
Internal Medicine Clinic Attending  Case discussed with Dr. Santos-Sanchez at the time of the visit.  We reviewed the resident's history and exam and pertinent patient test results.  I agree with the assessment, diagnosis, and plan of care documented in the resident's note.  Analina Filla, M.D., Ph.D.  

## 2019-10-21 ENCOUNTER — Encounter: Payer: Self-pay | Admitting: Infectious Disease

## 2019-11-23 ENCOUNTER — Other Ambulatory Visit: Payer: Self-pay | Admitting: Internal Medicine

## 2019-11-23 DIAGNOSIS — K219 Gastro-esophageal reflux disease without esophagitis: Secondary | ICD-10-CM

## 2019-11-23 DIAGNOSIS — E7800 Pure hypercholesterolemia, unspecified: Secondary | ICD-10-CM

## 2019-11-24 ENCOUNTER — Other Ambulatory Visit: Payer: Medicare Other

## 2019-11-24 ENCOUNTER — Other Ambulatory Visit: Payer: Self-pay

## 2019-11-24 DIAGNOSIS — B20 Human immunodeficiency virus [HIV] disease: Secondary | ICD-10-CM | POA: Diagnosis not present

## 2019-11-25 ENCOUNTER — Other Ambulatory Visit: Payer: Medicare Other

## 2019-11-25 LAB — T-HELPER CELL (CD4) - (RCID CLINIC ONLY)
CD4 % Helper T Cell: 20 % — ABNORMAL LOW (ref 33–65)
CD4 T Cell Abs: 663 /uL (ref 400–1790)

## 2019-11-26 ENCOUNTER — Telehealth: Payer: Self-pay

## 2019-11-26 NOTE — Telephone Encounter (Signed)
Unable to reach pt to notified him the disability parking placard is ready for pick up.

## 2019-11-27 LAB — COMPLETE METABOLIC PANEL WITH GFR
AG Ratio: 1.8 (calc) (ref 1.0–2.5)
ALT: 22 U/L (ref 9–46)
AST: 19 U/L (ref 10–35)
Albumin: 4.4 g/dL (ref 3.6–5.1)
Alkaline phosphatase (APISO): 57 U/L (ref 35–144)
BUN: 11 mg/dL (ref 7–25)
CO2: 25 mmol/L (ref 20–32)
Calcium: 9.3 mg/dL (ref 8.6–10.3)
Chloride: 107 mmol/L (ref 98–110)
Creat: 0.79 mg/dL (ref 0.70–1.18)
GFR, Est African American: 105 mL/min/{1.73_m2} (ref >=60)
GFR, Est Non African American: 91 mL/min/{1.73_m2} (ref >=60)
Globulin: 2.4 g/dL (calc) (ref 1.9–3.7)
Glucose, Bld: 226 mg/dL — ABNORMAL HIGH (ref 65–99)
Potassium: 4.2 mmol/L (ref 3.5–5.3)
Sodium: 138 mmol/L (ref 135–146)
Total Bilirubin: 0.7 mg/dL (ref 0.2–1.2)
Total Protein: 6.8 g/dL (ref 6.1–8.1)

## 2019-11-27 LAB — RPR TITER: RPR Titer: 1:4 {titer} — ABNORMAL HIGH

## 2019-11-27 LAB — CBC WITH DIFFERENTIAL/PLATELET
Absolute Monocytes: 428 cells/uL (ref 200–950)
Basophils Absolute: 40 cells/uL (ref 0–200)
Basophils Relative: 0.7 %
Eosinophils Absolute: 148 cells/uL (ref 15–500)
Eosinophils Relative: 2.6 %
HCT: 38.1 % — ABNORMAL LOW (ref 38.5–50.0)
Hemoglobin: 13.7 g/dL (ref 13.2–17.1)
Lymphs Abs: 3249 cells/uL (ref 850–3900)
MCH: 40.1 pg — ABNORMAL HIGH (ref 27.0–33.0)
MCHC: 36 g/dL (ref 32.0–36.0)
MCV: 111.4 fL — ABNORMAL HIGH (ref 80.0–100.0)
MPV: 9.2 fL (ref 7.5–12.5)
Monocytes Relative: 7.5 %
Neutro Abs: 1835 cells/uL (ref 1500–7800)
Neutrophils Relative %: 32.2 %
Platelets: 209 10*3/uL (ref 140–400)
RBC: 3.42 10*6/uL — ABNORMAL LOW (ref 4.20–5.80)
RDW: 12.5 % (ref 11.0–15.0)
Total Lymphocyte: 57 %
WBC: 5.7 10*3/uL (ref 3.8–10.8)

## 2019-11-27 LAB — HIV-1 RNA QUANT-NO REFLEX-BLD
HIV 1 RNA Quant: <20 copies/mL
HIV-1 RNA Quant, Log: <1.3 Log copies/mL

## 2019-11-27 LAB — FLUORESCENT TREPONEMAL AB(FTA)-IGG-BLD: Fluorescent Treponemal ABS: REACTIVE — AB

## 2019-11-27 LAB — RPR: RPR Ser Ql: REACTIVE — AB

## 2019-12-01 ENCOUNTER — Other Ambulatory Visit: Payer: Self-pay | Admitting: Internal Medicine

## 2019-12-01 DIAGNOSIS — I10 Essential (primary) hypertension: Secondary | ICD-10-CM

## 2019-12-08 ENCOUNTER — Encounter: Payer: Self-pay | Admitting: Surgery

## 2019-12-08 ENCOUNTER — Ambulatory Visit (HOSPITAL_COMMUNITY)
Admission: RE | Admit: 2019-12-08 | Discharge: 2019-12-08 | Disposition: A | Discharge status: Home / Self Care | Payer: Medicare Other | Source: Ambulatory Visit | Attending: Surgery | Admitting: Surgery

## 2019-12-08 ENCOUNTER — Ambulatory Visit (INDEPENDENT_AMBULATORY_CARE_PROVIDER_SITE_OTHER): Payer: Medicare Other | Admitting: Surgery

## 2019-12-08 ENCOUNTER — Other Ambulatory Visit: Payer: Self-pay

## 2019-12-08 ENCOUNTER — Other Ambulatory Visit: Payer: Self-pay | Admitting: Surgery

## 2019-12-08 ENCOUNTER — Ambulatory Visit (INDEPENDENT_AMBULATORY_CARE_PROVIDER_SITE_OTHER)
Admission: RE | Admit: 2019-12-08 | Discharge: 2019-12-08 | Disposition: A | Discharge status: Home / Self Care | Payer: Medicare Other | Source: Ambulatory Visit | Attending: Surgery | Admitting: Surgery

## 2019-12-08 VITALS — BP 145/69 | HR 69 | Temp 97.6°F | Resp 20 | Ht 73.0 in | Wt 184.0 lb

## 2019-12-08 DIAGNOSIS — I70212 Atherosclerosis of native arteries of extremities with intermittent claudication, left leg: Secondary | ICD-10-CM

## 2019-12-08 DIAGNOSIS — I739 Peripheral vascular disease, unspecified: Secondary | ICD-10-CM | POA: Insufficient documentation

## 2019-12-08 DIAGNOSIS — I6523 Occlusion and stenosis of bilateral carotid arteries: Secondary | ICD-10-CM

## 2019-12-08 DIAGNOSIS — I70213 Atherosclerosis of native arteries of extremities with intermittent claudication, bilateral legs: Secondary | ICD-10-CM | POA: Insufficient documentation

## 2019-12-08 NOTE — Progress Notes (Signed)
Vascular and Vein Specialist of [facility]  Patient name: Nathan [name] MRN: [id number] DOB: 12/23/1950 Sex: male   REASON FOR VISIT:    Follow up  HISOTRY OF PRESENT ILLNESS:    Nathan [name] is a 69 y.o. male who is back today for follow-up.  He initially presented with severe right leg pain in 2008.  He underwent a right femoral to below-knee popliteal artery bypass graft with saphenous vein on 02/26/2007.  This occluded and on 11/12/2007 he had a redo bypass with Gore-Tex.  He ultimately ended up with a right above-knee amputation.  In 2010 he began having left leg claudication and underwent popliteal artery stenting.  This was repeated on 08/17/2017  He is having some mild leg cramping but thinks this may be attributed to the heat.  He does not have any open wounds.  He continues to work on diabetes management.  He continues to take a statin.  PAST MEDICAL HISTORY:   Past Medical History:  Diagnosis Date  . Asthma    per 2003 [facility] pulm records pfts   . Blood dyscrasia    HIV  . Clotting disorder ([place])   . Colon polyps    noted previous colonoscopy [facility]  . DDD (degenerative disc disease)    cervical spine  . Depression   . Diabetes mellitus    dx 2010  . Fever    unknwon origin  . GERD (gastroesophageal reflux disease)   . Head swelling 05/23/2017  . Hep C w/o coma, chronic ([place])   . History of syphilis    noted [facility] records  . HIV infection ([place])    undetectable viral load and CD4 ct 667 as of 11/2011  . Hyperlipidemia   . Hypertension   . MRSA (methicillin resistant Staphylococcus aureus)   . Multiple sclerosis ([place])    in remission as of 11/2011 (diagnosed late 1980s)  . Pain in limb-Right Leg 10/13/2013  . 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[place])    2002  . Pneumonia   . Prosthesis fitting 03/10/2019  . PVD (peripheral vascular disease) ([place])    Left Stent 07/02/2008  . Scalp lesion 07/26/2017  . UTI (urinary tract  infection)   . Wears dentures      FAMILY HISTORY:   Family History  Problem Relation Age of Onset  . Diabetes Mother   . Coronary artery disease Mother   . Cancer Brother        colon caner stage 4 as of 11/2011 (unknown age of onset)  . Coronary artery disease Father   . Prostate cancer Brother   . Colon cancer Brother   . Rectal cancer Neg Hx     SOCIAL HISTORY:   Social History   Tobacco Use  . Smoking status: Former Smoker    Quit date: 05/09/2007    Years since quitting: 12.5  . Smokeless tobacco: Former Systems developer    Types: Chew  Substance Use Topics  . Alcohol use: No    Alcohol/week: 0.0 standard drinks     ALLERGIES:   Allergies  Allergen Reactions  . Sulfonamide Derivatives Hives     CURRENT MEDICATIONS:   Current Outpatient Medications  Medication Sig Dispense Refill  . atorvastatin (LIPITOR) 40 MG tablet TAKE 1 TABLET(40 MG) BY MOUTH DAILY 90 tablet 1  . bictegravir-emtricitabine-tenofovir AF (BIKTARVY) 50-200-25 MG TABS tablet Take 1 tablet by mouth daily. 30 tablet 5  . Blood Glucose Monitoring Suppl (BAYER CONTOUR MONITOR) W/DEVICE KIT Use to check  blood sugar as instructed up to 4 times a day 1 kit 0  . clopidogrel (PLAVIX) 75 MG tablet Take 1 tablet (75 mg total) by mouth daily. 90 tablet 3  . glucose blood (CONTOUR NEXT TEST) test strip 1 each by Other route 4 (four) times daily. Use 4 times daily to check blood sugar. diag code E11.42. 375 each 1  . insulin glargine, 2 Unit Dial, (TOUJEO MAX SOLOSTAR) 300 UNIT/ML Solostar Pen Inject 75 Units into the skin at bedtime. 6 pen 3  . insulin lispro (HUMALOG KWIKPEN) 200 UNIT/ML KwikPen Inject 25 Units into the skin with breakfast, with lunch, and with evening meal. 5 pen 3  . Insulin Pen Needle (BD PEN NEEDLE NANO U/F) 32G X 4 MM MISC USE AS DIRECTED FOUR TIMES DAILY. Dx code  E11.42 200 each 5  . Lancet Devices (BAYER MICROLET 2 LANCING DEVIC) MISC Use to check blood sugar up to 3 ties a day 1 each 2  .  liraglutide (VICTOZA) 18 MG/3ML SOPN ADMINISTER 1.8 MG UNDER THE SKIN EVERY MORNING 15 mL 6  . lisinopril (ZESTRIL) 40 MG tablet TAKE 1 TABLET BY MOUTH DAILY. HOLD IF SYSTOLIC BLOOD PRESSURE IS LESS THAN 110 90 tablet 3  . omeprazole (PRILOSEC) 40 MG capsule TAKE 1 CAPSULE(40 MG) BY MOUTH DAILY 90 capsule 0  . zidovudine (RETROVIR) 300 MG tablet TAKE 1 TABLET(300 MG) BY MOUTH TWICE DAILY 60 tablet 5   No current facility-administered medications for this visit.    REVIEW OF SYSTEMS:   [X] denotes positive finding, [ ] denotes negative finding Cardiac  Comments:  Chest pain or chest pressure:    Shortness of breath upon exertion:    Short of breath when lying flat:    Irregular heart rhythm:        Vascular    Pain in calf, thigh, or hip brought on by ambulation: x   Pain in feet at night that wakes you up from your sleep:     Blood clot in your veins:    Leg swelling:         Pulmonary    Oxygen at home:    Productive cough:     Wheezing:         Neurologic    Sudden weakness in arms or legs:     Sudden numbness in arms or legs:     Sudden onset of difficulty speaking or slurred speech:    Temporary loss of vision in one eye:     Problems with dizziness:         Gastrointestinal    Blood in stool:     Vomited blood:         Genitourinary    Burning when urinating:     Blood in urine:        Psychiatric    Major depression:         Hematologic    Bleeding problems:    Problems with blood clotting too easily:        Skin    Rashes or ulcers:        Constitutional    Fever or chills:      PHYSICAL EXAM:   Vitals:   12/08/19 1402  BP: (!) 145/69  Pulse: 69  Resp: 20  Temp: 97.6 F (36.4 C)  SpO2: 92%  Weight: 184 lb (83.5 kg)  Height: 6' 1" (1.854 m)    GENERAL: The patient is a well-nourished male, in no  acute distress. The vital signs are documented above. CARDIAC: There is a regular rate and rhythm.  VASCULAR: Nonpalpable pedal  pulses PULMONARY: Non-labored respirations MUSCULOSKELETAL: There are no major deformities or cyanosis. NEUROLOGIC: No focal weakness or paresthesias are detected. SKIN: There are no ulcers or rashes noted. PSYCHIATRIC: The patient has a normal affect.  STUDIES:   I have reviewed the following: Carotid duplex: Right Carotid: Velocities in the right ICA are consistent with a 40-59%         stenosis.   Left Carotid: Velocities in the left ICA are consistent with a 40-59%  stenosis.   Vertebrals: Bilateral vertebral arteries demonstrate antegrade flow.  Subclavians: Normal flow hemodynamics were seen in bilateral subclavian        arteries.   +-------+-----------+-----------+------------+------------+  ABI/TBIToday's ABIToday's TBIPrevious ABIPrevious TBI  +-------+-----------+-----------+------------+------------+  Right AKA    AKA    AKA     AKA       +-------+-----------+-----------+------------+------------+  Left  0.71    0.52    0.72    0.50      +-------+-----------+-----------+------------+------------+  Left toe: 69  Left leg duplex:  Left: Patent stent with no evidence of stenosis in the popliteal artery.   Velocities in the mid superficial femoral artery are suggestive of a  50-74% stenosis.  (Velocity is 315) MEDICAL ISSUES:   Carotid: Patient has mild bilateral stenosis which is asymptomatic.  He will get a repeat duplex in August 2022  PAD: The patient does endorse mild claudication symptoms.  His ABIs are unchanged however the velocity profile in SFA has gone from 280 to 315 cm/s.  This will need to be monitored closely.  I will have him come back in 6 months.  If the velocity profile increases and he continues to have symptoms, I would plan on repeat arteriogram    Leia Alf, MD, FACS Vascular and Vein Specialists of [facility] [phone] Pager [phone]

## 2019-12-09 ENCOUNTER — Other Ambulatory Visit: Payer: Self-pay | Admitting: *Deleted

## 2019-12-09 DIAGNOSIS — E1142 Type 2 diabetes mellitus with diabetic polyneuropathy: Secondary | ICD-10-CM

## 2019-12-09 DIAGNOSIS — IMO0002 Reserved for concepts with insufficient information to code with codable children: Secondary | ICD-10-CM

## 2019-12-09 MED ORDER — CONTOUR NEXT TEST VI STRP: 1.0000 | ORAL_STRIP | Freq: Four times a day (QID) | 1 refills | Status: DC

## 2019-12-11 ENCOUNTER — Other Ambulatory Visit: Payer: Self-pay | Admitting: *Deleted

## 2019-12-11 DIAGNOSIS — I739 Peripheral vascular disease, unspecified: Secondary | ICD-10-CM

## 2019-12-11 DIAGNOSIS — I70212 Atherosclerosis of native arteries of extremities with intermittent claudication, left leg: Secondary | ICD-10-CM

## 2019-12-11 DIAGNOSIS — I6523 Occlusion and stenosis of bilateral carotid arteries: Secondary | ICD-10-CM

## 2019-12-15 ENCOUNTER — Other Ambulatory Visit: Payer: Self-pay

## 2019-12-15 ENCOUNTER — Encounter: Payer: Self-pay | Admitting: Infectious Disease

## 2019-12-15 ENCOUNTER — Ambulatory Visit (INDEPENDENT_AMBULATORY_CARE_PROVIDER_SITE_OTHER): Payer: Medicare Other | Admitting: Infectious Disease

## 2019-12-15 VITALS — BP 168/73 | HR 67 | Temp 97.7°F | Wt 184.0 lb

## 2019-12-15 DIAGNOSIS — B20 Human immunodeficiency virus [HIV] disease: Secondary | ICD-10-CM

## 2019-12-15 DIAGNOSIS — I6523 Occlusion and stenosis of bilateral carotid arteries: Secondary | ICD-10-CM | POA: Diagnosis not present

## 2019-12-15 DIAGNOSIS — E785 Hyperlipidemia, unspecified: Secondary | ICD-10-CM | POA: Diagnosis not present

## 2019-12-15 DIAGNOSIS — Z8619 Personal history of other infectious and parasitic diseases: Secondary | ICD-10-CM

## 2019-12-15 DIAGNOSIS — E1169 Type 2 diabetes mellitus with other specified complication: Secondary | ICD-10-CM | POA: Diagnosis not present

## 2019-12-15 DIAGNOSIS — I739 Peripheral vascular disease, unspecified: Secondary | ICD-10-CM

## 2019-12-15 DIAGNOSIS — I10 Essential (primary) hypertension: Secondary | ICD-10-CM

## 2019-12-15 DIAGNOSIS — Z89611 Acquired absence of right leg above knee: Secondary | ICD-10-CM

## 2019-12-15 NOTE — Progress Notes (Signed)
Chief complaint: followup for HIV on meds,without any new complaints  Subjective:    Patient ID: Nathan [name], male    DOB: 11/14/1950, 69 y.o.   MRN: [id number]  HPI  Nathan [name] is a 69 y.o. male whose HIV has been perfectly suppressed on Biktarvy and BID AZT  We ran [employer] which is shown below:      He is seen internal medicine for his primary care and also seen vascular surgery recently and they are following his peripheral vascular disease closely  Past Medical History:  Diagnosis Date  . Asthma    per 2003 [facility] pulm records pfts   . Blood dyscrasia    HIV  . Clotting disorder ([place])   . Colon polyps    noted previous colonoscopy [facility]  . DDD (degenerative disc disease)    cervical spine  . Depression   . Diabetes mellitus    dx 2010  . Fever    unknwon origin  . GERD (gastroesophageal reflux disease)   . Head swelling 05/23/2017  . Hep C w/o coma, chronic ([place])   . History of syphilis    noted [facility] records  . HIV infection ([place])    undetectable viral load and CD4 ct 667 as of 11/2011  . Hyperlipidemia   . Hypertension   . MRSA (methicillin resistant Staphylococcus aureus)   . Multiple sclerosis ([place])    in remission as of 11/2011 (diagnosed late 1980s)  . Pain in limb-Right Leg 10/13/2013  . 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[place])    2002  . Pneumonia   . Prosthesis fitting 03/10/2019  . PVD (peripheral vascular disease) ([place])    Left Stent 07/02/2008  . Scalp lesion 07/26/2017  . UTI (urinary tract infection)   . Wears dentures     Past Surgical History:  Procedure Laterality Date  . ABDOMINAL AORTOGRAM W/LOWER EXTREMITY N/A 08/07/2017   Procedure: ABDOMINAL AORTOGRAM W/LOWER EXTREMITY;  Surgeon: Serafina Mitchell, MD;  Location: [place] CV LAB;  Service: Cardiovascular;  Laterality: N/A;  . ABOVE KNEE LEG AMPUTATION     Right Leg  . COLONOSCOPY W/ BIOPSIES AND POLYPECTOMY    . LOWER EXTREMITY ANGIOGRAM Left 12/26/2011   Procedure:  LOWER EXTREMITY ANGIOGRAM;  Surgeon: Serafina Mitchell, MD;  Location: [facility] CATH LAB;  Service: Cardiovascular;  Laterality: Left;  . LOWER EXTREMITY ANGIOGRAM Left 08/17/2017   Procedure: LOWER EXTREMITY ANGIOGRAM LEFT LEG WITH RUNOFF AND Stenting.;  Surgeon: Serafina Mitchell, MD;  Location: [place];  Service: Vascular;  Laterality: Left;  Marland Kitchen MULTIPLE TOOTH EXTRACTIONS    . OTHER SURGICAL HISTORY     right lower ext AKA with prothesis   . OTHER SURGICAL HISTORY     left left with stents (Dr. Seward Speck)  . OTHER SURGICAL HISTORY     2003 colonoscopy 5 mm polyp transverse colon; (2)23m  polyps in rectum-hyperplastic  . PERIPHERAL VASCULAR INTERVENTION Left 08/17/2017   Procedure: POPLITEAL STENT;  Surgeon: BSerafina Mitchell MD;  [facility];  Service: Vascular;  Laterality: Left;    Family History  Problem Relation Age of Onset  . Diabetes Mother   . Coronary artery disease Mother   . Cancer Brother        colon caner stage 4 as of 11/2011 (unknown age of onset)  . Coronary artery disease Father   . Prostate cancer Brother   . Colon cancer Brother   . Rectal cancer Neg Hx  Social History   Socioeconomic History  . Marital status: Single    Spouse name: Not on file  . Number of children: Not on file  . Years of education: 22  . Highest education level: Not on file  Occupational History  . Not on file  Tobacco Use  . Smoking status: Former Smoker    Quit date: 05/09/2007    Years since quitting: 12.6  . Smokeless tobacco: Former Systems developer    Types: Secondary school teacher  . Vaping Use: Never used  Substance and Sexual Activity  . Alcohol use: No    Alcohol/week: 0.0 standard drinks  . Drug use: No  . Sexual activity: Yes    Partners: Male    Comment: declined condoms  Other Topics Concern  . Not on file  Social History Narrative  . Not on file   Social Determinants of Health   Financial Resource Strain:   . Difficulty of Paying Living Expenses:   Food Insecurity:    . Worried About Charity fundraiser in the Last Year:   . Arboriculturist in the Last Year:   Transportation Needs:   . Film/video editor (Medical):   Marland Kitchen Lack of Transportation (Non-Medical):   Physical Activity:   . Days of Exercise per Week:   . Minutes of Exercise per Session:   Stress:   . Feeling of Stress :   Social Connections:   . Frequency of Communication with Friends and Family:   . Frequency of Social Gatherings with Friends and Family:   . Attends Religious Services:   . Active Member of Clubs or Organizations:   . Attends Archivist Meetings:   Marland Kitchen Marital Status:     Allergies  Allergen Reactions  . Sulfonamide Derivatives Hives     Current Outpatient Medications:  .  atorvastatin (LIPITOR) 40 MG tablet, TAKE 1 TABLET(40 MG) BY MOUTH DAILY, Disp: 90 tablet, Rfl: 1 .  bictegravir-emtricitabine-tenofovir AF (BIKTARVY) 50-200-25 MG TABS tablet, Take 1 tablet by mouth daily., Disp: 30 tablet, Rfl: 5 .  Blood Glucose Monitoring Suppl (BAYER CONTOUR MONITOR) W/DEVICE KIT, Use to check blood sugar as instructed up to 4 times a day, Disp: 1 kit, Rfl: 0 .  clopidogrel (PLAVIX) 75 MG tablet, Take 1 tablet (75 mg total) by mouth daily., Disp: 90 tablet, Rfl: 3 .  glucose blood (CONTOUR NEXT TEST) test strip, 1 each by Other route 4 (four) times daily. Use 4 times daily to check blood sugar. diag code E11.42., Disp: 375 each, Rfl: 1 .  insulin glargine, 2 Unit Dial, (TOUJEO MAX SOLOSTAR) 300 UNIT/ML Solostar Pen, Inject 75 Units into the skin at bedtime., Disp: 6 pen, Rfl: 3 .  insulin lispro (HUMALOG KWIKPEN) 200 UNIT/ML KwikPen, Inject 25 Units into the skin with breakfast, with lunch, and with evening meal., Disp: 5 pen, Rfl: 3 .  Insulin Pen Needle (BD PEN NEEDLE NANO U/F) 32G X 4 MM MISC, USE AS DIRECTED FOUR TIMES DAILY. Dx code  E11.42, Disp: 200 each, Rfl: 5 .  Lancet Devices (BAYER MICROLET 2 LANCING DEVIC) MISC, Use to check blood sugar up to 3 ties a day,  Disp: 1 each, Rfl: 2 .  liraglutide (VICTOZA) 18 MG/3ML SOPN, ADMINISTER 1.8 MG UNDER THE SKIN EVERY MORNING, Disp: 15 mL, Rfl: 6 .  lisinopril (ZESTRIL) 40 MG tablet, TAKE 1 TABLET BY MOUTH DAILY. HOLD IF SYSTOLIC BLOOD PRESSURE IS LESS THAN 110, Disp: 90 tablet, Rfl: 3 .  omeprazole (PRILOSEC) 40 MG capsule, TAKE 1 CAPSULE(40 MG) BY MOUTH DAILY, Disp: 90 capsule, Rfl: 0 .  zidovudine (RETROVIR) 300 MG tablet, TAKE 1 TABLET(300 MG) BY MOUTH TWICE DAILY, Disp: 60 tablet, Rfl: 5  Review of Systems  Constitutional: Negative for activity change, appetite change, chills, diaphoresis and unexpected weight change.  HENT: Negative for sinus pressure, sneezing and trouble swallowing.   Eyes: Negative for photophobia and visual disturbance.  Respiratory: Negative for chest tightness and stridor.   Cardiovascular: Negative for palpitations and leg swelling.  Gastrointestinal: Negative for abdominal distention, anal bleeding, blood in stool and constipation.  Genitourinary: Negative for difficulty urinating, dysuria, flank pain and hematuria.  Musculoskeletal: Negative for back pain, gait problem and joint swelling.  Skin: Negative for color change and pallor.  Neurological: Negative for dizziness, tremors, weakness and light-headedness.  Hematological: Negative for adenopathy. Does not bruise/bleed easily.  Psychiatric/Behavioral: Negative for agitation, behavioral problems, confusion, decreased concentration, dysphoric mood and sleep disturbance.       Objective:   Physical Exam Vitals and nursing note reviewed.  Constitutional:      General: He is not in acute distress.    Appearance: He is well-developed. He is not diaphoretic.  HENT:     Head: Atraumatic.     Mouth/Throat:     Pharynx: No oropharyngeal exudate.  Eyes:     General: No scleral icterus.    Conjunctiva/sclera: Conjunctivae normal.  Cardiovascular:     Rate and Rhythm: Normal rate and regular rhythm.  Pulmonary:      Effort: Pulmonary effort is normal. No respiratory distress.     Breath sounds: No wheezing.  Abdominal:     General: There is no distension.  Musculoskeletal:        General: No tenderness.     Cervical back: Normal range of motion and neck supple.  Skin:    General: Skin is warm and dry.     Coloration: Skin is not pale.     Findings: No erythema.  Neurological:     Mental Status: He is alert and oriented to person, place, and time.  Psychiatric:        Mood and Affect: Mood normal.        Behavior: Behavior normal.        Thought Content: Thought content normal.        Judgment: Judgment normal.        Assessment & Plan:    HIV: highly R virus:   Continue Biktarvy plus twice daily AZT (could consider Biktarvy alone but I am not ready to make that switch yet and might prefer to wait for newer drugs that wil come out in future for combination rx   Syphilis: Continue to follow titers   History of below the knee amputation: Followed by vascular also  DM: followed closely in IM clinic A1c   PVD by vascular surgery and no need for interventions there is see him again in 6 months  HTN: followed by PCP pressure up in our clinic again. Vitals:   12/15/19 1021  BP: (!) 168/73  Pulse: 67  Temp: 97.7 F (36.5 C)

## 2019-12-24 ENCOUNTER — Encounter: Payer: Self-pay | Admitting: Infectious Disease

## 2020-01-27 ENCOUNTER — Other Ambulatory Visit: Payer: Self-pay

## 2020-01-27 DIAGNOSIS — E1169 Type 2 diabetes mellitus with other specified complication: Secondary | ICD-10-CM

## 2020-01-27 MED ORDER — HUMALOG KWIKPEN 200 UNIT/ML ~~LOC~~ SOPN: 25.0000 [IU] | PEN_INJECTOR | Freq: Three times a day (TID) | SUBCUTANEOUS | 0 refills | Status: DC

## 2020-01-27 MED ORDER — TOUJEO MAX SOLOSTAR 300 UNIT/ML ~~LOC~~ SOPN: 75.0000 [IU] | PEN_INJECTOR | Freq: Every day | SUBCUTANEOUS | 0 refills | Status: DC

## 2020-01-27 NOTE — Telephone Encounter (Signed)
insulin lispro (HUMALOG KWIKPEN) 200 UNIT/ML KwikPen  insulin glargine, 2 Unit Dial, (TOUJEO MAX SOLOSTAR) 300 UNIT/ML Solostar , REFILL REQUEST @  [id number] - [place], [place] - [address] Phone:  [phone]  Fax:  [phone]

## 2020-02-03 IMAGING — CT CT HEAD WITHOUT CONTRAST
3 of 4 series · 16 of 47 positions shown, 19 images · non-contrast
Comparison: None.

CLINICAL DATA: Fall. Head trauma, minor. GCS greater than or equal
to 13. High clinical risk. Patient was found at home with [REDACTED] well
check. Last seen 4 days ago. Patient fell 4 days ago and was unable
to get up.

EXAM:
CT HEAD WITHOUT CONTRAST
TECHNIQUE: Contiguous axial images were obtained from the base of the skull
through the vertex without intravenous contrast.

[Series 4: head 2.0 h70h · axial · 0.45mm/px · z∈[-110,+36]mm · 10 of 87 slices shown, 13 images]
[im 9/87  brain]
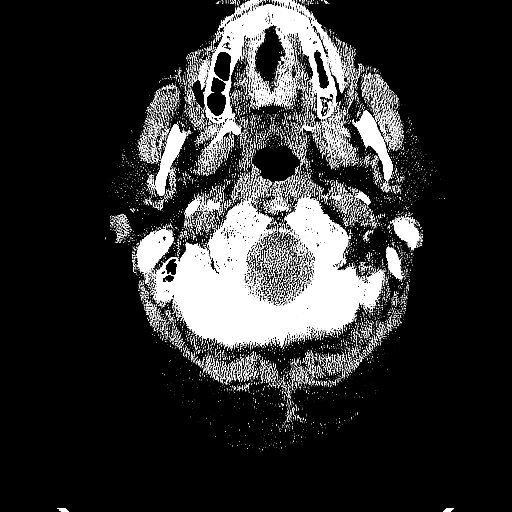
[im 9/87  bone]
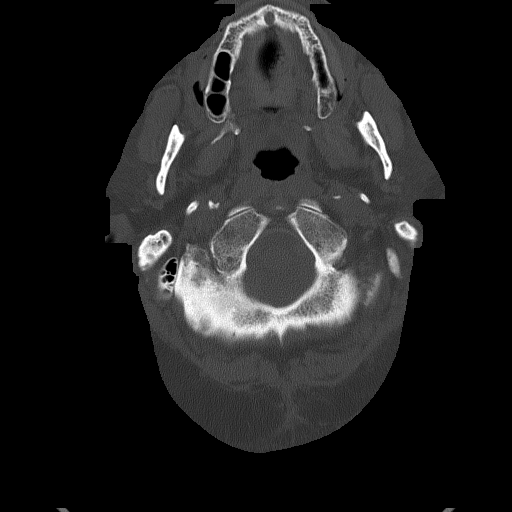
[im 17/87  brain]
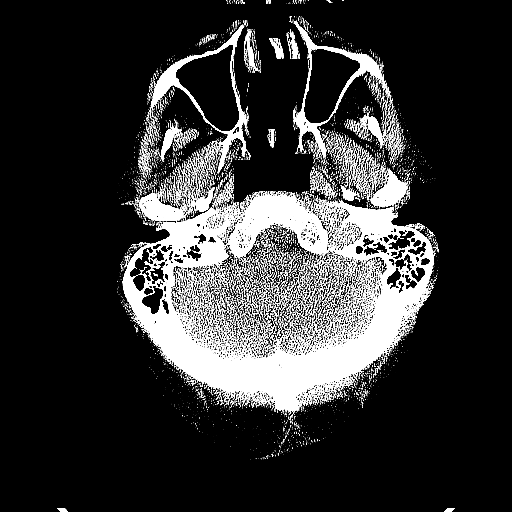
[im 25/87  brain]
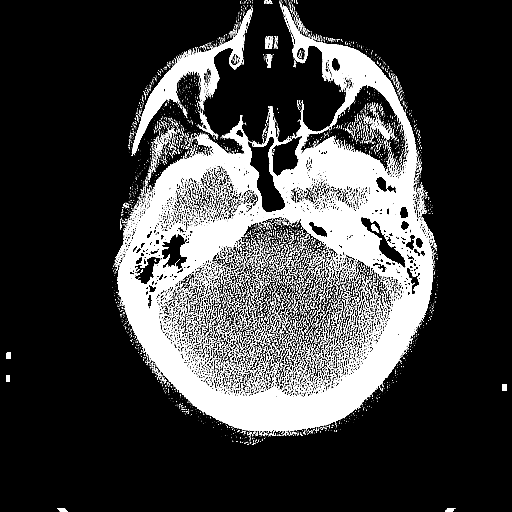
[im 33/87  brain]
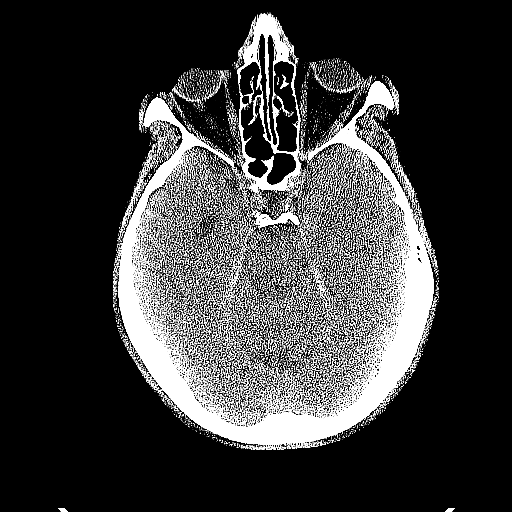
[im 41/87  brain]
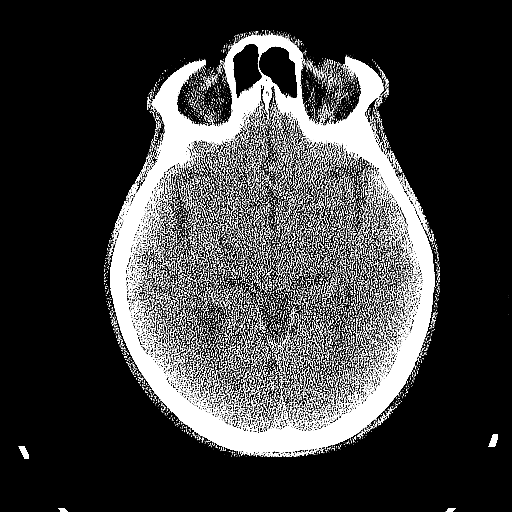
[im 41/87  bone]
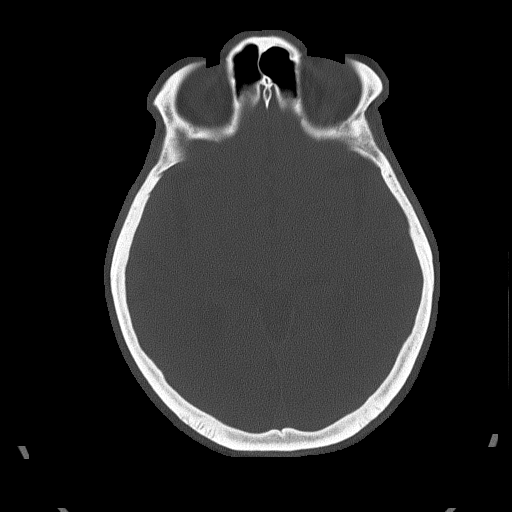
[im 50/87  brain]
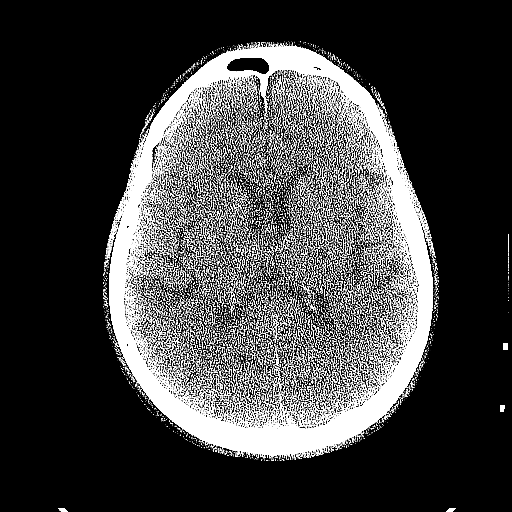
[im 58/87  brain]
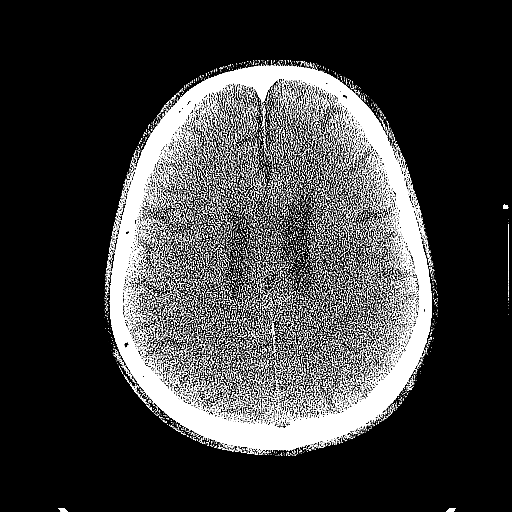
[im 66/87  brain]
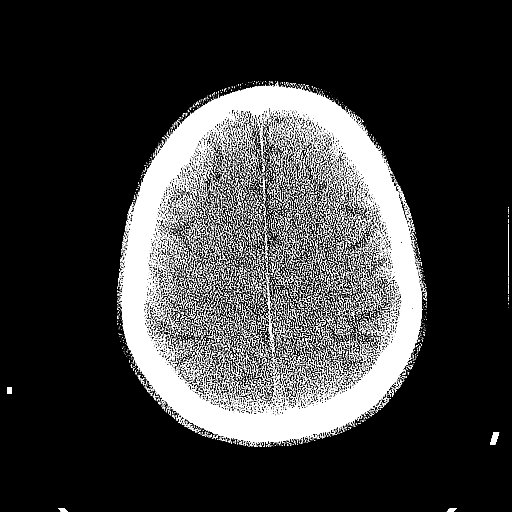
[im 74/87  brain]
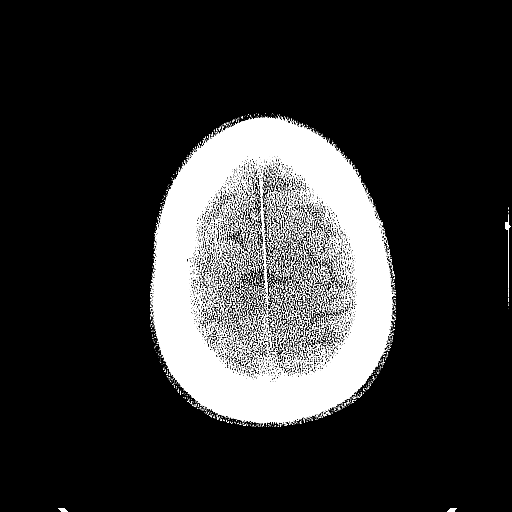
[im 74/87  bone]
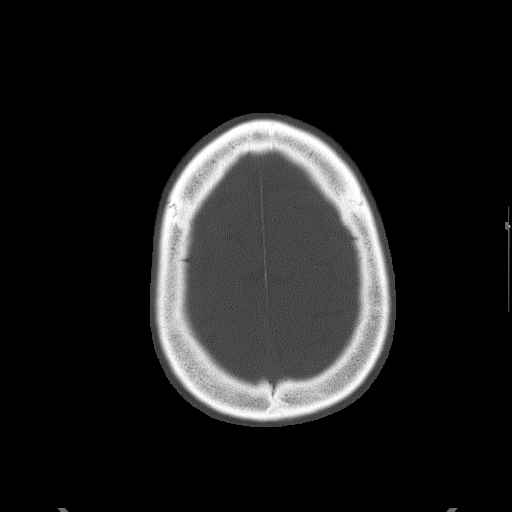
[im 82/87  brain]
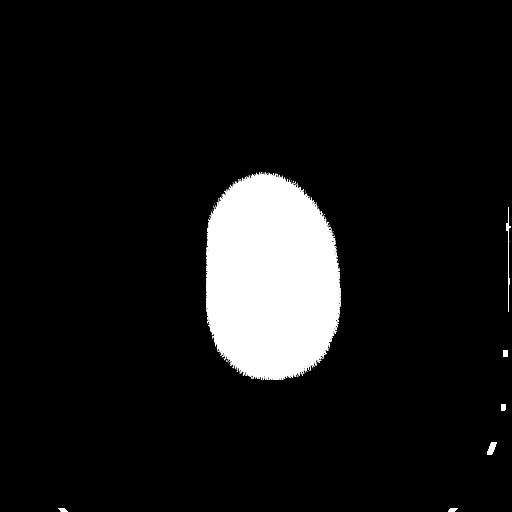

[Series 5: head 3.0 mpr cor · coronal · 0.35mm/px · 3 of 73 slices shown]
[im 25/73  brain]
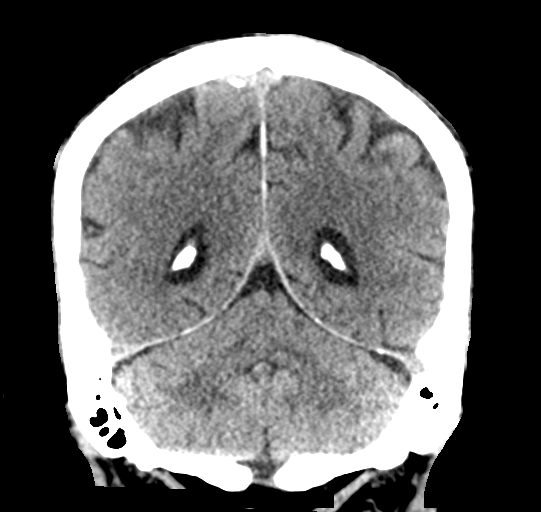
[im 33/73  brain]
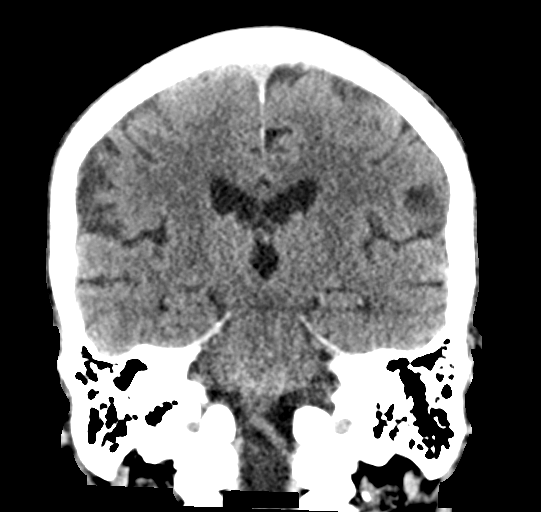
[im 41/73  brain]
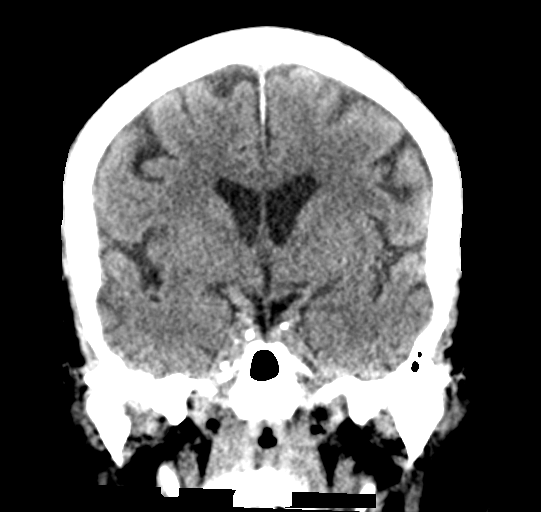

[Series 6: head 3.0 mpr sag · sagittal · 0.35mm/px · 3 of 57 slices shown]
[im 19/57  brain]
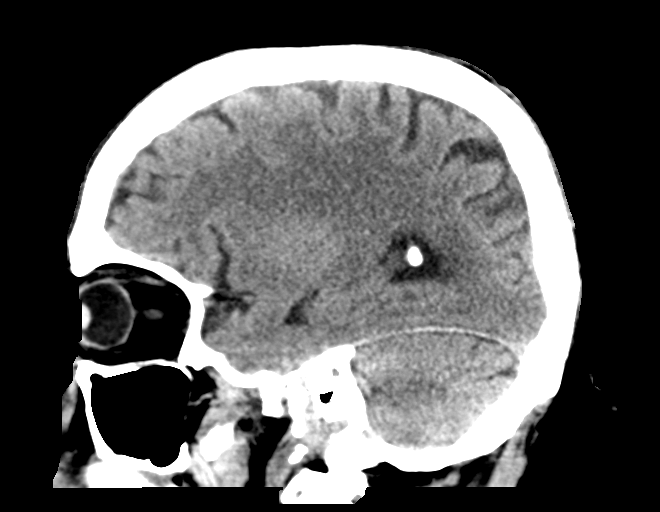
[im 29/57  brain]
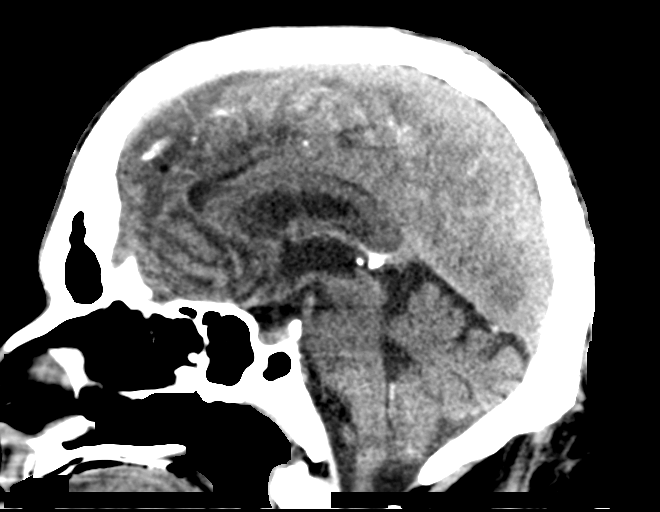
[im 38/57  brain]
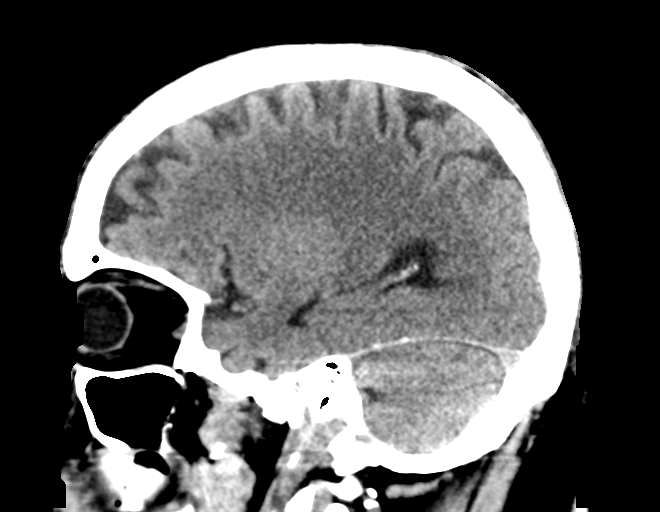

[16 of 47 positions shown; findings below may reference images not displayed]

FINDINGS: Brain: No evidence of acute infarction, hemorrhage, hydrocephalus,
extra-axial collection or mass lesion/mass effect.

Vascular: No hyperdense vessel or unexpected calcification.

Skull: Normal. Negative for fracture or focal lesion.

Sinuses/Orbits: No acute finding.

Other: None.
IMPRESSION: Negative exam.

## 2020-02-04 IMAGING — DX PORTABLE CHEST - 1 VIEW
1 series · 1 of 1 positions shown · non-contrast
Comparison: Chest radiograph dated 12/29/2018

CLINICAL DATA: 68-year-old male with respiratory failure.

EXAM:
PORTABLE CHEST 1 VIEW

[chest]
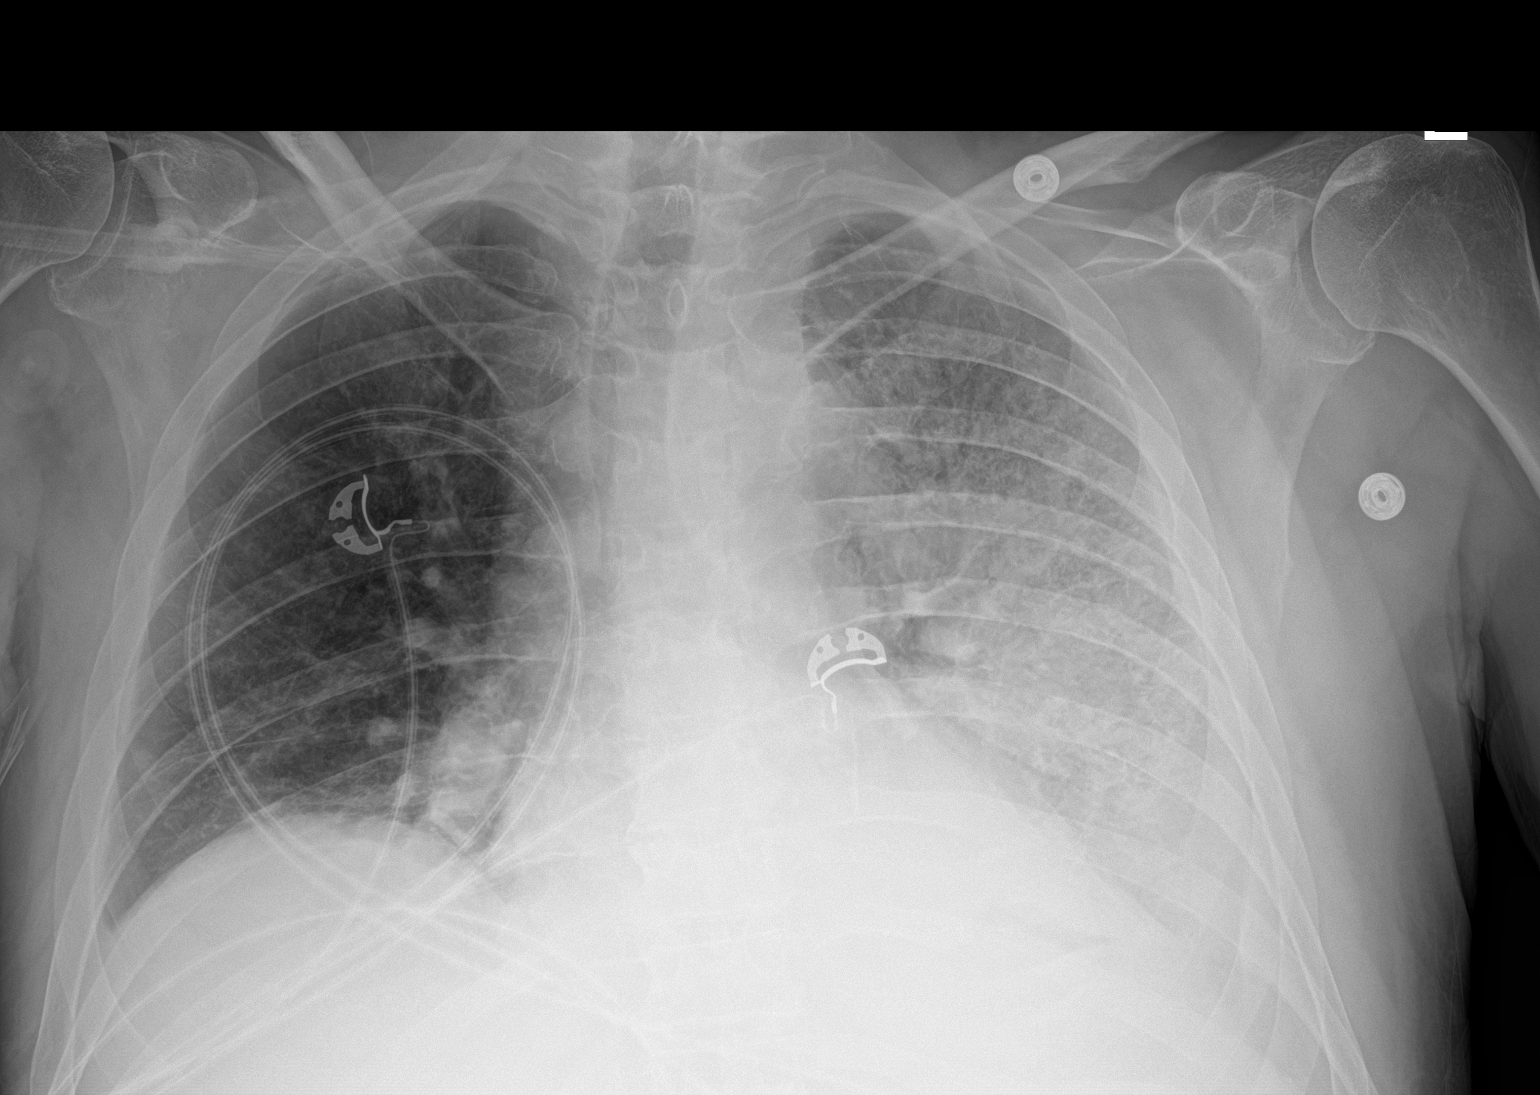

[1 of 1 positions shown; findings below may reference images not displayed]

FINDINGS: There is a probable small left pleural effusion, new or increased
since the prior radiograph. Diffuse interstitial and airspace
density throughout the left lung similar to prior radiograph. The
right lung remains clear. Trace right pleural effusion may be
present. No pneumothorax. Stable cardiac silhouette. No acute
osseous pathology.
IMPRESSION: 1. Diffuse interstitial and airspace density throughout the left
lung similar to prior radiograph. Clinical correlation and follow-up
recommended.
2. Probable small left pleural effusion.

## 2020-02-07 ENCOUNTER — Other Ambulatory Visit: Payer: Self-pay | Admitting: Infectious Disease

## 2020-02-07 DIAGNOSIS — B20 Human immunodeficiency virus [HIV] disease: Secondary | ICD-10-CM

## 2020-02-08 ENCOUNTER — Other Ambulatory Visit: Payer: Self-pay | Admitting: Infectious Disease

## 2020-02-08 DIAGNOSIS — B20 Human immunodeficiency virus [HIV] disease: Secondary | ICD-10-CM

## 2020-02-09 ENCOUNTER — Other Ambulatory Visit: Payer: Self-pay

## 2020-02-09 DIAGNOSIS — B20 Human immunodeficiency virus [HIV] disease: Secondary | ICD-10-CM

## 2020-02-09 MED ORDER — BIKTARVY 50-200-25 MG PO TABS: 1.0000 | ORAL_TABLET | Freq: Every day | ORAL | 5 refills | Status: DC

## 2020-02-10 ENCOUNTER — Other Ambulatory Visit: Payer: Self-pay

## 2020-02-10 ENCOUNTER — Ambulatory Visit (INDEPENDENT_AMBULATORY_CARE_PROVIDER_SITE_OTHER): Payer: Medicare Other | Admitting: Student

## 2020-02-10 ENCOUNTER — Encounter: Payer: Self-pay | Admitting: Student

## 2020-02-10 VITALS — BP 136/61 | HR 66 | Temp 97.5°F | Ht 73.0 in | Wt 180.3 lb

## 2020-02-10 DIAGNOSIS — D126 Benign neoplasm of colon, unspecified: Secondary | ICD-10-CM

## 2020-02-10 DIAGNOSIS — B20 Human immunodeficiency virus [HIV] disease: Secondary | ICD-10-CM | POA: Diagnosis not present

## 2020-02-10 DIAGNOSIS — Z23 Encounter for immunization: Secondary | ICD-10-CM | POA: Diagnosis not present

## 2020-02-10 DIAGNOSIS — E785 Hyperlipidemia, unspecified: Secondary | ICD-10-CM

## 2020-02-10 DIAGNOSIS — I739 Peripheral vascular disease, unspecified: Secondary | ICD-10-CM

## 2020-02-10 DIAGNOSIS — I10 Essential (primary) hypertension: Secondary | ICD-10-CM

## 2020-02-10 DIAGNOSIS — Z794 Long term (current) use of insulin: Secondary | ICD-10-CM

## 2020-02-10 DIAGNOSIS — L821 Other seborrheic keratosis: Secondary | ICD-10-CM | POA: Insufficient documentation

## 2020-02-10 DIAGNOSIS — E1169 Type 2 diabetes mellitus with other specified complication: Secondary | ICD-10-CM

## 2020-02-10 LAB — GLUCOSE, CAPILLARY: Glucose-Capillary: 197 mg/dL — ABNORMAL HIGH (ref 70–99)

## 2020-02-10 LAB — POCT GLYCOSYLATED HEMOGLOBIN (HGB A1C): Hemoglobin A1C: 7 % — AB (ref 4.0–5.6)

## 2020-02-10 MED ORDER — TOUJEO MAX SOLOSTAR 300 UNIT/ML ~~LOC~~ SOPN: 78.0000 [IU] | PEN_INJECTOR | Freq: Every day | SUBCUTANEOUS | 0 refills | Status: DC

## 2020-02-10 NOTE — Assessment & Plan Note (Signed)
Patient follows with Dr. Trula Slade. He had a repeat vascular ultrasound of the LLE that showed moderate arterial disease with stable ABI's but increased flow velocities.  - Per Dr. Trula Slade, patient is to return around 06/2020 for repeat and possible arteriogram if worsening.

## 2020-02-10 NOTE — Assessment & Plan Note (Signed)
Patient notes new hyperpigmented, raised lesion without pain or itching that has grown rapidly on his left forehead over the past 3 to 4 months. Lesion is most consistent with seborrheic keratosis. Patient has never seen a dermatologist and has significant sun exposure history. He also has a lesion concerning for actinic keratosis lateral to his left eye.  - Will refer to dermatology for further evaluation - Encouraged minimizing sun exposure with use of sunscreen

## 2020-02-10 NOTE — Assessment & Plan Note (Signed)
Patient has a history of HTN for which he takes lisinopril 40mg  daily. His blood pressure today is improved from prior to 136/61. - Continue lisinopril 40mg  daily for now - May require additional medication if SBP remains >145mmHg next visit.

## 2020-02-10 NOTE — Progress Notes (Signed)
Internal Medicine Clinic Attending  I saw and evaluated the patient.  I personally confirmed the key portions of the history and exam documented by Dr. Speakman and I reviewed pertinent patient test results.  The assessment, diagnosis, and plan were formulated together and I agree with the documentation in the resident's note.  

## 2020-02-10 NOTE — Patient Instructions (Signed)
Nathan [name],  I am glad to see that your sugars are under better control recently! Your hemoglobin A1c has decreased to 7.0, right about at our goal of < 7.0. I have informed your pharmacy that you should increase your Toujeo from 76 to 78 units before bed and continue your other medications as prescribed.  Your lesion on your scalp is likely a seborrheic keratosis (not malignant) although I have referred your to see a dermatologist for a more thorough skin examination. You may discuss the possibility of removal of the lesion if you are interested in doing so.   Please continue the great work and don't hesitate to call with any questions or concerns: [phone].  Please schedule a follow up visit in 3 months.  Dr. Konrad Penta   Seborrheic Keratosis A seborrheic keratosis is a common, noncancerous (benign) skin growth. These growths are velvety, waxy, rough, tan, brown, or black spots that appear on the skin. These skin growths can be flat or raised, and scaly. What are the causes? The cause of this condition is not known. What increases the risk? You are more likely to develop this condition if you:  Have a family history of seborrheic keratosis.  Are 50 or older.  Are pregnant.  Have had estrogen replacement therapy. What are the signs or symptoms? Symptoms of this condition include growths on the face, chest, shoulders, back, or other areas. These growths:  Are usually painless, but may become irritated and itchy.  Can be yellow, brown, black, or other colors.  Are slightly raised or have a flat surface.  Are sometimes rough or wart-like in texture.  Are often velvety or waxy on the surface.  Are round or oval-shaped.  Often occur in groups, but may occur as a single growth. How is this diagnosed? This condition is diagnosed with a medical history and physical exam.  A sample of the growth may be tested (skin biopsy).  You may need to see a skin specialist  (dermatologist). How is this treated? Treatment is not usually needed for this condition, unless the growths are irritated or bleed often.  You may also choose to have the growths removed if you do not like their appearance. ? Most commonly, these growths are treated with a procedure in which liquid nitrogen is applied to "freeze" off the growth (cryosurgery). ? They may also be burned off with electricity (electrocautery) or removed by scraping (curettage). Follow these instructions at home:  Watch your growth for any changes.  Keep all follow-up visits as told by your health care provider. This is important.  Do not scratch or pick at the growth or growths. This can cause them to become irritated or infected. Contact a health care provider if:  You suddenly have many new growths.  Your growth bleeds, itches, or hurts.  Your growth suddenly becomes larger or changes color. Summary  A seborrheic keratosis is a common, noncancerous (benign) skin growth.  Treatment is not usually needed for this condition, unless the growths are irritated or bleed often.  Watch your growth for any changes.  Contact a health care provider if you suddenly have many new growths or your growth suddenly becomes larger or changes color.  Keep all follow-up visits as told by your health care provider. This is important. This information is not intended to replace advice given to you by your health care provider. Make sure you discuss any questions you have with your health care provider. Document Revised: 09/06/2017 Document Reviewed: 09/06/2017  Elsevier Patient Education  [employer].

## 2020-02-10 NOTE — Assessment & Plan Note (Signed)
Patient presented today for follow up of Type II DM. His home medications have recently been increased due to rise in hemoglobin A1c to 8.2 in February 2021. He currently takes Glargine 76 units before bed, Lispro 25 units three times daily with meals, and Liraglutide 1.14m daily in the mornings. He takes these medications regularly without missing doses and monitors his sugars an average of 2-3 times daily, with readings over the past month between 111-278; 54% of the readings were above target, 46% of readings at target, and 0% below target. Patient endorses chronic LLE numbness without pain and is s/p RLE above knee amputation with comorbid PAD. His last ophthalmologist and foot exams were June 09, 2019. Patient endorses eating sugary foods and has met with a nutritionist in the past and is not interested in meeting with nutritionalist again currently.  - Will increase glargine from 76 units to 78 units nightly  - Continue Lispro 25 units with meals - Continue Liraglutide 1.827mdaily  - Encouraged limiting simple carbohydrates and following DASH diet - Patient encouraged to continue monitoring blood glucose at home

## 2020-02-10 NOTE — Assessment & Plan Note (Signed)
Patient not due for repeat colonoscopy until 05/2021.

## 2020-02-10 NOTE — Progress Notes (Signed)
CC: Skin Lesion  HPI:   Nathan [name] is a 69 y.o. M w/ PMHx as detailed below coming in for follow up of DM. Last visit, his A1c was elevated, and his home insulins doses were increased. Mr. Klingensmith states that he has taken his medications regularly as prescribed and continues to monitor his blood sugars 2-3 times daily. His sugars have ranged from 111-278 since September, and he denies any hypoglycemic symptoms including diaphoresis, palpitations, confusion, nausea. He states that he continues to experience left lower extremity numbness (to touch and temperature) but states this is chronic and unchanged, and he follows with a vascular physician regularly for this. He notes that he has a new raised bump on his left forehead that has rapidly grown in size over the past 4 months. He denies any pain, itching, or bleeding, although he notes he picks at it. He notes a long history of continued sun exposure and denies use of sunscreen. He has never seen a dermatologist.   Past Medical History:  Diagnosis Date  . Asthma    per 2003 [facility] pulm records pfts   . Blood dyscrasia    HIV  . Clotting disorder ([place])   . Colon polyps    noted previous colonoscopy [facility]  . DDD (degenerative disc disease)    cervical spine  . Depression   . Diabetes mellitus    dx 2010  . Fever    unknwon origin  . GERD (gastroesophageal reflux disease)   . Head swelling 05/23/2017  . Hep C w/o coma, chronic ([place])   . History of syphilis    noted [facility] records  . HIV infection ([place])    undetectable viral load and CD4 ct 667 as of 11/2011  . Hyperlipidemia   . Hypertension   . MRSA (methicillin resistant Staphylococcus aureus)   . Multiple sclerosis ([place])    in remission as of 11/2011 (diagnosed late 1980s)  . Pain in limb-Right Leg 10/13/2013  . 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[place])    2002  . Pneumonia   . Prosthesis fitting 03/10/2019  . PVD (peripheral vascular disease) ([place])    Left Stent  07/02/2008  . Scalp lesion 07/26/2017  . UTI (urinary tract infection)   . Wears dentures    Review of Systems:  All others negative except as noted above. Specifically, no headache, changes in vision, polyuria, polydipsia, abdominal pain, vomiting. All others negative except as noted above in HPI.   Physical Exam:  Vitals:   02/10/20 0945  BP: 136/61  Pulse: 66  Temp: (!) 97.5 F (36.4 C)  TempSrc: Oral  SpO2: 98%  Weight: 180 lb 4.8 oz (81.8 kg)  Height: 6\' 1"  (1.854 m)   General: Patient appears well. No acute distress. Eyes: Sclera non-icteric. No conjunctival injection.  HENT: Neck is supple. No cervical or supraclavicular lymphadenopathy.  Respiratory: Lungs are CTA, bilaterally. No wheezes, rales, or rhonchi.  Cardiovascular: Regular rate and rhythm. No murmurs, rubs, or gallops. Varicose veins present on the left lower extremity. RLE s/p above knee amputation.  Abdominal: Soft and distended but without tenderness to palpation. Bowel sounds intact. No rebound or guarding.  Skin: There is a raised 0.7x0.7cm rough, light brown lesion along patient's left forehead that is not tender to palpation without bleeding. There is a pinpoint area of roughened, erythematous skin lateral to patient's left eye. Patient's right lower extremity has chronic erythematous changes and is shiny and hairless.   Psych: Normal affect. Normal tone  of voice.   Assessment & Plan:   See Encounters Tab for problem based charting.  Patient seen with Dr. Jimmye Norman.   Jeralyn Bennett, MD 02/10/2020, 11:46 AM Pager: [phone]

## 2020-02-10 NOTE — Assessment & Plan Note (Signed)
Patient follows with ID Dr. Tommy Medal. His last CD4 count on 11/24/19 was 663. RPR titer was 1:4 and HIV was negative. Patient takes [place] and Zidovudine regularly.  - Patient received his flu shot today.

## 2020-02-10 NOTE — Assessment & Plan Note (Signed)
Continue high intensity Atorvastatin 40mg  daily. Patient says he follows closely with vascular physician.  - Consider repeat lipid panel if patient may benefit from more aggressive cholesterol lowering

## 2020-02-18 DIAGNOSIS — Z23 Encounter for immunization: Secondary | ICD-10-CM | POA: Diagnosis not present

## 2020-02-19 ENCOUNTER — Other Ambulatory Visit: Payer: Self-pay

## 2020-02-19 ENCOUNTER — Other Ambulatory Visit: Payer: Self-pay | Admitting: Internal Medicine

## 2020-02-19 DIAGNOSIS — I739 Peripheral vascular disease, unspecified: Secondary | ICD-10-CM

## 2020-02-19 DIAGNOSIS — K219 Gastro-esophageal reflux disease without esophagitis: Secondary | ICD-10-CM

## 2020-02-19 MED ORDER — CLOPIDOGREL BISULFATE 75 MG PO TABS: 75.0000 mg | ORAL_TABLET | Freq: Every day | ORAL | 3 refills | Status: DC

## 2020-02-27 ENCOUNTER — Other Ambulatory Visit: Payer: Self-pay

## 2020-02-27 DIAGNOSIS — E1169 Type 2 diabetes mellitus with other specified complication: Secondary | ICD-10-CM

## 2020-02-27 DIAGNOSIS — Z794 Long term (current) use of insulin: Secondary | ICD-10-CM

## 2020-02-27 MED ORDER — PIOGLITAZONE HCL 30 MG PO TABS: 30.0000 mg | ORAL_TABLET | Freq: Every day | ORAL | 3 refills | Status: DC

## 2020-02-27 NOTE — Telephone Encounter (Signed)
Please call pt back about meds.  

## 2020-02-27 NOTE — Telephone Encounter (Signed)
Looks like the med fell off his list after getting expired. He has no contraindications. Will re-send script with refills.

## 2020-02-27 NOTE — Telephone Encounter (Signed)
Patient called in requesting refill on pioglitazone. States his last refill was in July 2021 for 90 tabs but he is now completely out. This med is no longer on current med list but patient states he has been on this for years. Please advise. [name], BSN, RN-BC

## 2020-03-01 DIAGNOSIS — U071 COVID-19: Secondary | ICD-10-CM | POA: Diagnosis not present

## 2020-03-01 DIAGNOSIS — Z20822 Contact with and (suspected) exposure to covid-19: Secondary | ICD-10-CM | POA: Diagnosis not present

## 2020-03-09 ENCOUNTER — Other Ambulatory Visit: Payer: Medicare Other

## 2020-03-09 DIAGNOSIS — Z20822 Contact with and (suspected) exposure to covid-19: Secondary | ICD-10-CM | POA: Diagnosis not present

## 2020-03-10 LAB — NOVEL CORONAVIRUS, NAA: SARS-CoV-2, NAA: DETECTED — AB

## 2020-03-10 LAB — SARS-COV-2, NAA 2 DAY TAT

## 2020-03-11 ENCOUNTER — Telehealth: Payer: Self-pay | Admitting: Nurse Practitioner

## 2020-03-11 ENCOUNTER — Telehealth: Payer: Self-pay | Admitting: Adult Health

## 2020-03-11 NOTE — Telephone Encounter (Signed)
Called to Discuss with patient about Covid symptoms and the use of the monoclonal antibody infusion for those with mild to moderate Covid symptoms and at a high risk of hospitalization.     Pt appears to qualify for this infusion due to co-morbid conditions and/or a member of an at-risk group in accordance with the FDA Emergency Use Authorization. (HIV, hypertension)   Unable to reach pt. Voicemail left.   Alda Lea, NP [facility]  [phone]

## 2020-03-11 NOTE — Telephone Encounter (Signed)
Called to Discuss with patient about Covid symptoms and the use of the monoclonal antibody infusion for those with mild to moderate Covid symptoms and at a high risk of hospitalization.     Pt appears to qualify for this infusion due to co-morbid conditions and/or a member of an at-risk group in accordance with the FDA Emergency Use Authorization.    Unable to reach pt    Wilber Bihari, NP

## 2020-03-19 ENCOUNTER — Other Ambulatory Visit: Payer: Self-pay | Admitting: Student

## 2020-03-19 DIAGNOSIS — Z794 Long term (current) use of insulin: Secondary | ICD-10-CM

## 2020-03-22 ENCOUNTER — Other Ambulatory Visit: Payer: Self-pay | Admitting: Student

## 2020-03-22 DIAGNOSIS — Z794 Long term (current) use of insulin: Secondary | ICD-10-CM

## 2020-03-22 DIAGNOSIS — E1169 Type 2 diabetes mellitus with other specified complication: Secondary | ICD-10-CM

## 2020-03-25 ENCOUNTER — Other Ambulatory Visit: Payer: Self-pay | Admitting: *Deleted

## 2020-03-25 DIAGNOSIS — E1169 Type 2 diabetes mellitus with other specified complication: Secondary | ICD-10-CM

## 2020-03-25 MED ORDER — TOUJEO MAX SOLOSTAR 300 UNIT/ML ~~LOC~~ SOPN: 78.0000 [IU] | PEN_INJECTOR | Freq: Every day | SUBCUTANEOUS | 0 refills | Status: DC

## 2020-03-25 NOTE — Telephone Encounter (Signed)
Rx sent 11/18 was sent as "NO Print"; please re-send electronically.  Thanks

## 2020-04-05 ENCOUNTER — Telehealth: Payer: Self-pay

## 2020-04-05 ENCOUNTER — Other Ambulatory Visit: Payer: Self-pay | Admitting: Infectious Disease

## 2020-04-05 DIAGNOSIS — B20 Human immunodeficiency virus [HIV] disease: Secondary | ICD-10-CM

## 2020-04-05 NOTE — Telephone Encounter (Signed)
Patient called stating [facility] told him that his refill for Phillips Odor was denied. RN called [facility] to confirm that patient does in fact have refills on file. RN called patient back to let him know that he does have Biktarvy refills on file.   Beryle Flock, RN

## 2020-04-06 NOTE — Addendum Note (Signed)
Addended by: Hulan Fray on: 04/06/2020 04:41 PM   Modules accepted: Orders

## 2020-04-27 ENCOUNTER — Other Ambulatory Visit: Payer: Self-pay | Admitting: *Deleted

## 2020-04-27 DIAGNOSIS — Z794 Long term (current) use of insulin: Secondary | ICD-10-CM

## 2020-04-27 MED ORDER — HUMALOG KWIKPEN 200 UNIT/ML ~~LOC~~ SOPN: 25.0000 [IU] | PEN_INJECTOR | Freq: Three times a day (TID) | SUBCUTANEOUS | 1 refills | Status: DC

## 2020-05-13 ENCOUNTER — Other Ambulatory Visit: Payer: Self-pay

## 2020-05-13 ENCOUNTER — Ambulatory Visit (INDEPENDENT_AMBULATORY_CARE_PROVIDER_SITE_OTHER): Payer: Medicare (Managed Care) | Admitting: Student

## 2020-05-13 ENCOUNTER — Encounter: Payer: Self-pay | Admitting: Student

## 2020-05-13 VITALS — BP 119/63 | HR 70 | Temp 97.5°F | Ht 73.0 in | Wt 179.0 lb

## 2020-05-13 DIAGNOSIS — IMO0002 Reserved for concepts with insufficient information to code with codable children: Secondary | ICD-10-CM

## 2020-05-13 DIAGNOSIS — E785 Hyperlipidemia, unspecified: Secondary | ICD-10-CM

## 2020-05-13 DIAGNOSIS — I10 Essential (primary) hypertension: Secondary | ICD-10-CM | POA: Diagnosis not present

## 2020-05-13 DIAGNOSIS — R785 Finding of other psychotropic drug in blood: Secondary | ICD-10-CM

## 2020-05-13 DIAGNOSIS — E7800 Pure hypercholesterolemia, unspecified: Secondary | ICD-10-CM

## 2020-05-13 DIAGNOSIS — E1142 Type 2 diabetes mellitus with diabetic polyneuropathy: Secondary | ICD-10-CM

## 2020-05-13 DIAGNOSIS — E1169 Type 2 diabetes mellitus with other specified complication: Secondary | ICD-10-CM

## 2020-05-13 DIAGNOSIS — Z794 Long term (current) use of insulin: Secondary | ICD-10-CM | POA: Diagnosis not present

## 2020-05-13 DIAGNOSIS — L821 Other seborrheic keratosis: Secondary | ICD-10-CM | POA: Diagnosis not present

## 2020-05-13 DIAGNOSIS — I739 Peripheral vascular disease, unspecified: Secondary | ICD-10-CM

## 2020-05-13 DIAGNOSIS — E1159 Type 2 diabetes mellitus with other circulatory complications: Secondary | ICD-10-CM | POA: Diagnosis not present

## 2020-05-13 LAB — GLUCOSE, CAPILLARY: Glucose-Capillary: 358 mg/dL — ABNORMAL HIGH (ref 70–99)

## 2020-05-13 LAB — POCT GLYCOSYLATED HEMOGLOBIN (HGB A1C): Hemoglobin A1C: 7.3 % — AB (ref 4.0–5.6)

## 2020-05-13 MED ORDER — TOUJEO MAX SOLOSTAR 300 UNIT/ML ~~LOC~~ SOPN: 79.0000 [IU] | PEN_INJECTOR | Freq: Every morning | SUBCUTANEOUS | 2 refills | Status: DC

## 2020-05-13 MED ORDER — TOUJEO MAX SOLOSTAR 300 UNIT/ML ~~LOC~~ SOPN
79.0000 [IU] | PEN_INJECTOR | Freq: Every morning | SUBCUTANEOUS | 2 refills | Status: DC
Start: 2020-05-13 — End: 2020-05-13

## 2020-05-13 NOTE — Assessment & Plan Note (Signed)
A: Last lipid panel was in 01/16/18, which showed just slightly elevated LDL of 71. Continues to take atorvastatin 40mg  and Plavix daily at home; however, does have very significant vascular disease and CVD risk factors.   P:  - Repeat lipid panel  - Consider additional lipid-lowering medication if LDL > 70

## 2020-05-13 NOTE — Progress Notes (Signed)
   CC: Hyperglycemia   HPI:  [name] is a 70 y.o. gentleman with hx as noted below, presenting for follow up of his diabetes with complaint of continued elevated blood sugars. Please see problem-based assessment and plan for more details.   Past Medical History:  Diagnosis Date  . Asthma    per 2003 [facility] pulm records pfts   . Blood dyscrasia    HIV  . Clotting disorder ([facility])   . Colon polyps    noted previous colonoscopy [facility]  . DDD (degenerative disc disease)    cervical spine  . Depression   . Diabetes mellitus    dx 2010  . Fever    unknwon origin  . GERD (gastroesophageal reflux disease)   . Head swelling 05/23/2017  . Hep C w/o coma, chronic ([facility])   . History of syphilis    noted [facility] records  . HIV infection ([facility])    undetectable viral load and CD4 ct 667 as of 11/2011  . Hyperlipidemia   . Hypertension   . MRSA (methicillin resistant Staphylococcus aureus)   . Multiple sclerosis ([facility])    in remission as of 11/2011 (diagnosed late 1980s)  . Pain in limb-Right Leg 10/13/2013  . 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[facility])    2002  . Pneumonia   . Prosthesis fitting 03/10/2019  . PVD (peripheral vascular disease) ([facility])    Left Stent 07/02/2008  . Scalp lesion 07/26/2017  . UTI (urinary tract infection)   . Wears dentures    Review of Systems:  All others negative except as noted in assessment and plan.   Physical Exam:  Vitals:   05/13/20 1009 05/13/20 1012 05/13/20 1045  BP: (!) 147/65 (!) 150/70 119/63  Pulse: 73 72 70  Temp: (!) 97.5 F (36.4 C)    TempSrc: Oral    SpO2: 99%    Weight: 179 lb (81.2 kg)    Height: 6\' 1"  (1.854 m)     General: Patient appears well. No acute distress. Eyes: Sclera non-icteric. No conjunctival injection.  Respiratory: Lungs are CTA, bilaterally. No wheezes, rales, or rhonchi.  Cardiovascular: Regular rate and rhythm. No murmurs, rubs, or gallops.No significant LLE pitting edema. Distal pulses 2+ in RLE.   Musculoskeletal: RLE s/p above knee amputation.Able to ambulate without assistive device.  Neurological: Sensation to light touch is decreased up to knee in LLE. Alert and oriented.  Skin:LLE is hairless with skin flaking. There is a ~1cm linear laceration along the dorsum of the left great toe with some surrounding erythema, but no edema, excess warmth, or active drainage. Lesion on left forehead and findings concerning for actinic keratosis of forehead no longer present.  Psych: Normal affect. Normal tone of voice.  Assessment & Plan:   See Encounters Tab for problem based charting.  Patient discussed with Dr. .  Antony Contras, MD 05/13/2020, 10:06 PM Pager: [phone]

## 2020-05-13 NOTE — Progress Notes (Deleted)
   CC: ***  HPI:  [name] is a 70 y.o.   Past Medical History:  Diagnosis Date  . Asthma    per 2003 [facility] pulm records pfts   . Blood dyscrasia    HIV  . Clotting disorder ([facility])   . Colon polyps    noted previous colonoscopy [facility]  . DDD (degenerative disc disease)    cervical spine  . Depression   . Diabetes mellitus    dx 2010  . Fever    unknwon origin  . GERD (gastroesophageal reflux disease)   . Head swelling 05/23/2017  . Hep C w/o coma, chronic ([facility])   . History of syphilis    noted [facility] records  . HIV infection ([facility])    undetectable viral load and CD4 ct 667 as of 11/2011  . Hyperlipidemia   . Hypertension   . MRSA (methicillin resistant Staphylococcus aureus)   . Multiple sclerosis ([facility])    in remission as of 11/2011 (diagnosed late 1980s)  . Pain in limb-Right Leg 10/13/2013  . 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[facility])    2002  . Pneumonia   . Prosthesis fitting 03/10/2019  . PVD (peripheral vascular disease) ([facility])    Left Stent 07/02/2008  . Scalp lesion 07/26/2017  . UTI (urinary tract infection)   . Wears dentures    Review of Systems:  All others negative except as noted in assessment/plan.  Physical Exam:  Vitals:   05/13/20 1009 05/13/20 1012 05/13/20 1045  BP: (!) 147/65 (!) 150/70 119/63  Pulse: 73 72 70  Temp: (!) 97.5 F (36.4 C)    TempSrc: Oral    SpO2: 99%    Weight: 179 lb (81.2 kg)    Height: 6\' 1"  (1.854 m)     General: Patient appears well. No acute distress. Eyes: Sclera non-icteric. No conjunctival injection.  Respiratory: Lungs are CTA, bilaterally. No wheezes, rales, or rhonchi.  Cardiovascular: Regular rate and rhythm. No murmurs, rubs, or gallops. No significant LLE pitting edema. Distal pulses 2+ in RLE.  Musculoskeletal: RLE s/p above knee amputation. Able to ambulate without assistive device.  Neurological: Sensation to light touch is decreased up to knee in LLE. Alert and oriented.  Skin: LLE is hairless  with skin flaking. There is a ~1cm linear laceration along the dorsum of the left great toe with some surrounding erythema, but no edema, excess warmth, or active drainage. Lesion on left forehead and findings concerning for actinic keratosis of forehead no longer present.  Psych: Normal affect. Normal tone of voice.   Assessment & Plan:   See Encounters Tab for problem based charting.  Patient discussed with Dr. .  Antony Contras, MD 05/13/2020, 9:47 PM Pager: [phone]

## 2020-05-13 NOTE — Assessment & Plan Note (Signed)
A: Hemoglobin A1c increased from 7.0 last visit to 7.4 today. Patient states he takes sugars 2-3 times daily at home although forgot to bring his glucometer with him. Was unaware that his Toujeo was increased last visit from 76 units to 78 units nightly. He thought he was supposed to take 75 units daily but admits that he has increased to ~77 units daily and continues to have sugars in the 2-300's which he states is unusual for him. Continues to take Lispro, liraglutide, and pioglitazone as prescribed. Did recently have COVID-19 two months ago, which is likely the cause for increase in sugars recently, although within goal < 8.0. Denies hypoglycemic episodes.   P:  - Increase Glargine to 79 units daily  - Continue Lispro 25 units TID with meals  - Continue liraglutide 1.8mg  daily  - Continue pioglitazone 30mg  daily  - Will schedule follow up telehealth appointment in 2 weeks to reassess blood sugars

## 2020-05-13 NOTE — Patient Instructions (Addendum)
Nathan [name],  Blood Glucose Monitoring, Adult Monitoring your blood sugar (glucose) is an important part of managing your diabetes (diabetes mellitus). Blood glucose monitoring involves checking your blood glucose as often as directed and keeping a record (log) of your results over time. Checking your blood glucose regularly and keeping a blood glucose log can:  Help you and your health care provider adjust your diabetes management plan as needed, including your medicines or insulin.  Help you understand how food, exercise, illnesses, and medicines affect your blood glucose.  Let you know what your blood glucose is at any time. You can quickly find out if you have low blood glucose (hypoglycemia) or high blood glucose (hyperglycemia). Your health care provider will set individualized treatment goals for you. Your goals will be based on your age, other medical conditions you have, and how you respond to diabetes treatment. Generally, the goal of treatment is to maintain the following blood glucose levels:  Before meals (preprandial): 80-130 mg/dL (4.4-7.2 mmol/L).  After meals (postprandial): below 180 mg/dL (10 mmol/L).  A1c level: less than 7%. Supplies needed:  Blood glucose meter.  Test strips for your meter. Each meter has its own strips. You must use the strips that came with your meter.  A needle to prick your finger (lancet). Do not use a lancet more than one time.  A device that holds the lancet (lancing device).  A journal or log book to write down your results. How to check your blood glucose  1. Wash your hands with soap and water. 2. Prick the side of your finger (not the tip) with the lancet. Use a different finger each time. 3. Gently rub the finger until a small drop of blood appears. 4. Follow instructions that come with your meter for inserting the test strip, applying blood to the strip, and using your blood glucose meter. 5. Write down your result and any  notes. Some meters allow you to use areas of your body other than your finger (alternative sites) to test your blood. The most common alternative sites are:  Forearm.  Thigh.  Palm of the hand. If you think you may have hypoglycemia, or if you have a history of not knowing when your blood glucose is getting low (hypoglycemia unawareness), do not use alternative sites. Use your finger instead. Alternative sites may not be as accurate as the fingers, because blood flow is slower in these areas. This means that the result you get may be delayed, and it may be different from the result that you would get from your finger. Follow these instructions at home: Blood glucose log   Every time you check your blood glucose, write down your result. Also write down any notes about things that may be affecting your blood glucose, such as your diet and exercise for the day. This information can help you and your health care provider: ? Look for patterns in your blood glucose over time. ? Adjust your diabetes management plan as needed.  Check if your meter allows you to download your records to a computer. Most glucose meters store a record of glucose readings in the meter. If you have type 1 diabetes:  Check your blood glucose 2 or more times a day.  Also check your blood glucose: ? Before every insulin injection. ? Before and after exercise. ? Before meals. ? 2 hours after a meal. ? Occasionally between 2:00 a.m. and 3:00 a.m., as directed. ? Before potentially dangerous tasks, like driving or  using heavy machinery. ? At bedtime.  You may need to check your blood glucose more often, up to 6-10 times a day, if you: ? Use an insulin pump. ? Need multiple daily injections (MDI). ? Have diabetes that is not well-controlled. ? Are ill. ? Have a history of severe hypoglycemia. ? Have hypoglycemia unawareness. If you have type 2 diabetes:  If you take insulin or other diabetes medicines, check your  blood glucose 2 or more times a day.  If you are on intensive insulin therapy, check your blood glucose 4 or more times a day. Occasionally, you may also need to check between 2:00 a.m. and 3:00 a.m., as directed.  Also check your blood glucose: ? Before and after exercise. ? Before potentially dangerous tasks, like driving or using heavy machinery.  You may need to check your blood glucose more often if: ? Your medicine is being adjusted. ? Your diabetes is not well-controlled. ? You are ill. General tips  Always keep your supplies with you.  If you have questions or need help, all blood glucose meters have a 24-hour "hotline" phone number that you can call. You may also contact your health care provider.  After you use a few boxes of test strips, adjust (calibrate) your blood glucose meter by following instructions that came with your meter. Contact a health care provider if:  Your blood glucose is at or above 240 mg/dL (13.3 mmol/L) for 2 days in a row.  You have been sick or have had a fever for 2 days or longer, and you are not getting better.  You have any of the following problems for more than 6 hours: ? You cannot eat or drink. ? You have nausea or vomiting. ? You have diarrhea. Get help right away if:  Your blood glucose is lower than 54 mg/dL (3 mmol/L).  You become confused or you have trouble thinking clearly.  You have difficulty breathing.  You have moderate or large ketone levels in your urine. Summary  Monitoring your blood sugar (glucose) is an important part of managing your diabetes (diabetes mellitus).  Blood glucose monitoring involves checking your blood glucose as often as directed and keeping a record (log) of your results over time.  Your health care provider will set individualized treatment goals for you. Your goals will be based on your age, other medical conditions you have, and how you respond to diabetes treatment.  Every time you check  your blood glucose, write down your result. Also write down any notes about things that may be affecting your blood glucose, such as your diet and exercise for the day. This information is not intended to replace advice given to you by your health care provider. Make sure you discuss any questions you have with your health care provider. Document Revised: 02/15/2018 Document Reviewed: 10/04/2015 Elsevier Patient Education  [place], we discussed that your high blood sugars recently are likely due to your recent Covid-19 infection.   We will increase your Toujeo from 75 units to 79 units every morning.   Your would on your foot appears to be healing well without signs of active infection. Continue to apply Neosporin with bandages and wear sell-fitted shoes.   We will check your cholesterol today, and I will call you with results. Your blood pressure looks great - keep up the good work!  Please be sure to check your blood sugars regularly BEFORE each meal, before bed, and first thing  when you wake up before giving your medications or taking your insulin.   I will schedule you a follow up telehealth (phone) visit in 2 weeks to assess how your sugars are doing.   In the meantime, please call if you experience worsening redness, swelling, warmth or drainage from your left toe/foot. Also if you experience fevers, chills, nausea, vomiting, abdominal pain, or any other new concerns.   Great to see you and take care,  Dr. Laddie Aquas

## 2020-05-13 NOTE — Assessment & Plan Note (Signed)
A: Initial blood pressure elevated, although repeat today within goal at 119/63.   P: Continue home lisinopril 40mg  daily

## 2020-05-13 NOTE — Assessment & Plan Note (Signed)
A: Last visit, patient had lesions most consistent with seborrheic keratosis and actinic keratosis on forehead. However, lesions have resolved today. Patient notes he scratched off previous lesion. Did not end up hearing back from dermatology.   P:  Given no concerning findings on PE today, will hold off on dermatology referral and continue to monitor.

## 2020-05-14 ENCOUNTER — Telehealth: Payer: Self-pay | Admitting: Student

## 2020-05-14 LAB — LIPID PANEL
Chol/HDL Ratio: 4 ratio (ref 0.0–5.0)
Cholesterol, Total: 153 mg/dL (ref 100–199)
HDL: 38 mg/dL — ABNORMAL LOW (ref >39)
LDL Chol Calc (NIH): 89 mg/dL (ref 0–99)
Triglycerides: 148 mg/dL (ref 0–149)
VLDL Cholesterol Cal: 26 mg/dL (ref 5–40)

## 2020-05-14 MED ORDER — CONTOUR NEXT TEST VI STRP: 1.0000 | ORAL_STRIP | Freq: Four times a day (QID) | 1 refills | Status: DC

## 2020-05-14 MED ORDER — EZETIMIBE 10 MG PO TABS
10.0000 mg | ORAL_TABLET | Freq: Every day | ORAL | 2 refills | Status: DC
Start: 2020-05-14 — End: 2021-01-31

## 2020-05-14 MED ORDER — ATORVASTATIN CALCIUM 40 MG PO TABS: ORAL_TABLET | ORAL | 2 refills | Status: DC

## 2020-05-14 MED ORDER — VICTOZA 18 MG/3ML ~~LOC~~ SOPN: PEN_INJECTOR | SUBCUTANEOUS | 6 refills | Status: DC

## 2020-05-14 NOTE — Telephone Encounter (Signed)
Left voicemail for patient that his cholesterol (LDL) returned above goal of 70 and that he would benefit from Ezetimibe 10mg  daily in addition to his atorvastatin 40mg  daily. Sent prescription and refills to his pharmacy and instructed him to call with any questions or concerns with call back number.  Jeralyn Bennett, PGY1 Internal medicine [phone]

## 2020-05-14 NOTE — Addendum Note (Signed)
Addended by: Jeralyn Bennett on: 05/14/2020 12:10 PM   Modules accepted: Orders

## 2020-05-18 NOTE — Progress Notes (Signed)
Internal Medicine Attending Note:  This patient's plan of care was discussed with the house staff. Please see their note for complete details. I concur with their findings.  Velna Ochs, MD 05/18/2020, 10:54 AM

## 2020-05-27 ENCOUNTER — Other Ambulatory Visit: Payer: Self-pay

## 2020-05-27 ENCOUNTER — Ambulatory Visit (INDEPENDENT_AMBULATORY_CARE_PROVIDER_SITE_OTHER): Payer: Medicare Other | Admitting: Student

## 2020-05-27 DIAGNOSIS — IMO0002 Reserved for concepts with insufficient information to code with codable children: Secondary | ICD-10-CM

## 2020-05-27 DIAGNOSIS — Z794 Long term (current) use of insulin: Secondary | ICD-10-CM | POA: Diagnosis not present

## 2020-05-27 DIAGNOSIS — E785 Hyperlipidemia, unspecified: Secondary | ICD-10-CM

## 2020-05-27 DIAGNOSIS — E1169 Type 2 diabetes mellitus with other specified complication: Secondary | ICD-10-CM

## 2020-05-27 DIAGNOSIS — E1165 Type 2 diabetes mellitus with hyperglycemia: Secondary | ICD-10-CM

## 2020-05-27 DIAGNOSIS — E1142 Type 2 diabetes mellitus with diabetic polyneuropathy: Secondary | ICD-10-CM | POA: Diagnosis not present

## 2020-05-27 MED ORDER — BD PEN NEEDLE NANO U/F 32G X 4 MM MISC
5 refills | Status: DC
Start: 2020-05-27 — End: 2020-08-30

## 2020-05-27 MED ORDER — HUMALOG KWIKPEN 200 UNIT/ML ~~LOC~~ SOPN: 28.0000 [IU] | PEN_INJECTOR | Freq: Three times a day (TID) | SUBCUTANEOUS | 1 refills | Status: DC

## 2020-05-27 MED ORDER — CONTOUR NEXT TEST VI STRP: 1.0000 | ORAL_STRIP | Freq: Every day | 3 refills | Status: DC

## 2020-05-27 NOTE — Assessment & Plan Note (Signed)
Nathan [name] states that his blood sugars continue to be elevated ~235-260 when he checks them 4 times daily: fasting in the morning and before every meal, despite increase in glargine from 77 to 79 units daily last visit. He notes he has been eating more since his COVID-19 infection several months ago and continues to desire to eat sugary foods. States his lowest sugar since his last visit was 200, does not miss medication dosings, and has been completely asymptomatic.   - Will increase humalog from 25 to 28 units three times daily with meals  - Continue glargine 79 units daily - Continue liraglutide 1.8mg  daily - Continue pioglitazone 30mg  daily  - Encouraged continued glucose monitoring up to 5 times daily (max his insurance allows) - Reassess Hb A1c in 3 months

## 2020-05-27 NOTE — Assessment & Plan Note (Signed)
Patient had elevated LDL above goal of 70 last visit. Was started on Zetia without complications.   - Continue atorvastatin 40mg  daily - Continue Zetia 10mg  daily

## 2020-05-27 NOTE — Progress Notes (Signed)
  [facility] Telephone Encounter Continuity Care Appointment  HPI:   This telephone encounter was created for [name] on 05/27/2020 for the following purpose/cc persistent hyperglycemia in the setting of previously well-controled Type II DM. Please see problem-based assessment/plan for full details.    Past Medical History:  Past Medical History:  Diagnosis Date  . Asthma    per 2003 [facility] pulm records pfts   . Blood dyscrasia    HIV  . Clotting disorder ([place])   . Colon polyps    noted previous colonoscopy [facility]  . DDD (degenerative disc disease)    cervical spine  . Depression   . Diabetes mellitus    dx 2010  . Fever    unknwon origin  . GERD (gastroesophageal reflux disease)   . Head swelling 05/23/2017  . Hep C w/o coma, chronic ([place])   . History of syphilis    noted [facility] records  . HIV infection ([place])    undetectable viral load and CD4 ct 667 as of 11/2011  . Hyperlipidemia   . Hypertension   . MRSA (methicillin resistant Staphylococcus aureus)   . Multiple sclerosis ([place])    in remission as of 11/2011 (diagnosed late 1980s)  . Pain in limb-Right Leg 10/13/2013  . 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 Pneumonia   . Prosthesis fitting 03/10/2019  . PVD (peripheral vascular disease) ([place])    Left Stent 07/02/2008  . Scalp lesion 07/26/2017  . UTI (urinary tract infection)   . Wears dentures       ROS:   Positive for: Increased appetite   Negative for: nausea, vomiting, abdominal pain, polydipsia, polyuria, diaphoresis, confusion, weakness   Assessment / Plan / Recommendations:   Please see A&P under problem oriented charting for assessment of the patient's acute and chronic medical conditions.   As always, pt is advised that if symptoms worsen or new symptoms arise, they should go to an urgent care facility or to to ER for further evaluation.   Consent and Medical Decision Making:   Patient discussed  with Nathan [name]  This is a telephone encounter between [name] and Jeralyn Bennett on 05/27/2020 for Hyperglycemia. The visit was conducted with the patient located at home and Jeralyn Bennett at [facility]. The patient's identity was confirmed using their DOB and current address. The patient has consented to being evaluated through a telephone encounter and understands the associated risks (an examination cannot be done and the patient may need to come in for an appointment) / benefits (allows the patient to remain at home, decreasing exposure to coronavirus). I personally spent 15 minutes on medical discussion.    Jeralyn Bennett, MD 05/27/2020, 8:43 AM Pager: [phone]

## 2020-05-28 NOTE — Progress Notes (Signed)
Internal Medicine Clinic Attending  Case discussed with Dr. Speakman  At the time of the visit.  We reviewed the resident's history and pertinent patient test results.  I agree with the assessment, diagnosis, and plan of care documented in the resident's note.  

## 2020-06-02 ENCOUNTER — Other Ambulatory Visit: Payer: Self-pay

## 2020-06-02 DIAGNOSIS — K219 Gastro-esophageal reflux disease without esophagitis: Secondary | ICD-10-CM

## 2020-06-02 MED ORDER — OMEPRAZOLE 40 MG PO CPDR: 40.0000 mg | DELAYED_RELEASE_CAPSULE | Freq: Every day | ORAL | 0 refills | Status: DC

## 2020-06-14 ENCOUNTER — Other Ambulatory Visit: Payer: Self-pay

## 2020-06-14 ENCOUNTER — Other Ambulatory Visit: Payer: Medicare Other

## 2020-06-14 DIAGNOSIS — B20 Human immunodeficiency virus [HIV] disease: Secondary | ICD-10-CM | POA: Diagnosis not present

## 2020-06-14 DIAGNOSIS — I739 Peripheral vascular disease, unspecified: Secondary | ICD-10-CM

## 2020-06-15 LAB — T-HELPER CELL (CD4) - (RCID CLINIC ONLY)
CD4 % Helper T Cell: 19 % — ABNORMAL LOW (ref 33–65)
CD4 T Cell Abs: 649 /uL (ref 400–1790)

## 2020-06-18 LAB — RPR TITER: RPR Titer: 1:2 {titer} — ABNORMAL HIGH

## 2020-06-18 LAB — COMPLETE METABOLIC PANEL WITH GFR
AG Ratio: 1.5 (calc) (ref 1.0–2.5)
ALT: 28 U/L (ref 9–46)
AST: 24 U/L (ref 10–35)
Albumin: 4.3 g/dL (ref 3.6–5.1)
Alkaline phosphatase (APISO): 55 U/L (ref 35–144)
BUN: 8 mg/dL (ref 7–25)
CO2: 29 mmol/L (ref 20–32)
Calcium: 9.6 mg/dL (ref 8.6–10.3)
Chloride: 104 mmol/L (ref 98–110)
Creat: 0.95 mg/dL (ref 0.70–1.18)
GFR, Est African American: 93 mL/min/{1.73_m2} (ref >=60)
GFR, Est Non African American: 80 mL/min/{1.73_m2} (ref >=60)
Globulin: 2.8 g/dL (calc) (ref 1.9–3.7)
Glucose, Bld: 132 mg/dL — ABNORMAL HIGH (ref 65–99)
Potassium: 4.2 mmol/L (ref 3.5–5.3)
Sodium: 140 mmol/L (ref 135–146)
Total Bilirubin: 0.7 mg/dL (ref 0.2–1.2)
Total Protein: 7.1 g/dL (ref 6.1–8.1)

## 2020-06-18 LAB — CBC WITH DIFFERENTIAL/PLATELET
Absolute Monocytes: 425 cells/uL (ref 200–950)
Basophils Absolute: 30 cells/uL (ref 0–200)
Basophils Relative: 0.5 %
Eosinophils Absolute: 153 cells/uL (ref 15–500)
Eosinophils Relative: 2.6 %
HCT: 38.8 % (ref 38.5–50.0)
Hemoglobin: 14 g/dL (ref 13.2–17.1)
Lymphs Abs: 3640 cells/uL (ref 850–3900)
MCH: 40.5 pg — ABNORMAL HIGH (ref 27.0–33.0)
MCHC: 36.1 g/dL — ABNORMAL HIGH (ref 32.0–36.0)
MCV: 112.1 fL — ABNORMAL HIGH (ref 80.0–100.0)
MPV: 8.9 fL (ref 7.5–12.5)
Monocytes Relative: 7.2 %
Neutro Abs: 1652 cells/uL (ref 1500–7800)
Neutrophils Relative %: 28 %
Platelets: 200 10*3/uL (ref 140–400)
RBC: 3.46 10*6/uL — ABNORMAL LOW (ref 4.20–5.80)
RDW: 12.6 % (ref 11.0–15.0)
Total Lymphocyte: 61.7 %
WBC: 5.9 10*3/uL (ref 3.8–10.8)

## 2020-06-18 LAB — HIV RNA, RTPCR W/R GT (RTI, PI,INT)
HIV 1 RNA Quant: <20 copies/mL
HIV-1 RNA Quant, Log: <1.3 Log copies/mL

## 2020-06-18 LAB — FLUORESCENT TREPONEMAL AB(FTA)-IGG-BLD: Fluorescent Treponemal ABS: REACTIVE — AB

## 2020-06-18 LAB — RPR: RPR Ser Ql: REACTIVE — AB

## 2020-07-01 ENCOUNTER — Ambulatory Visit (INDEPENDENT_AMBULATORY_CARE_PROVIDER_SITE_OTHER): Payer: Medicare Other | Admitting: Infectious Disease

## 2020-07-01 ENCOUNTER — Encounter: Payer: Self-pay | Admitting: Infectious Disease

## 2020-07-01 ENCOUNTER — Other Ambulatory Visit: Payer: Self-pay

## 2020-07-01 VITALS — BP 161/83 | HR 67 | Temp 97.4°F | Ht 73.0 in | Wt 178.0 lb

## 2020-07-01 DIAGNOSIS — G546 Phantom limb syndrome with pain: Secondary | ICD-10-CM | POA: Diagnosis not present

## 2020-07-01 DIAGNOSIS — B20 Human immunodeficiency virus [HIV] disease: Secondary | ICD-10-CM | POA: Diagnosis not present

## 2020-07-01 DIAGNOSIS — I10 Essential (primary) hypertension: Secondary | ICD-10-CM | POA: Diagnosis not present

## 2020-07-01 DIAGNOSIS — E1151 Type 2 diabetes mellitus with diabetic peripheral angiopathy without gangrene: Secondary | ICD-10-CM | POA: Diagnosis not present

## 2020-07-01 DIAGNOSIS — G909 Disorder of the autonomic nervous system, unspecified: Secondary | ICD-10-CM

## 2020-07-01 DIAGNOSIS — I70213 Atherosclerosis of native arteries of extremities with intermittent claudication, bilateral legs: Secondary | ICD-10-CM | POA: Diagnosis not present

## 2020-07-01 NOTE — Progress Notes (Signed)
Chief complaint: Neuropathic pain  Subjective:    Patient ID: Nathan [name], male    DOB: Oct 28, 1950, 70 y.o.   MRN: [id number]  HPI  Nathan [name] is a 70 y.o. male whose HIV has been perfectly suppressed on Biktarvy and BID AZT  We ran [employer] which is shown below:      He is seen internal medicine for his primary care and also seen vascular surgery recently and they are following his peripheral vascular disease closely.  I had been reluctant to take him off AZT and to simplify him to [facility] but I think now I feel quite comfortable doing so.  He is also agreeable to this change.  He does complain of neuropathic pain and feels that we should not of take him off oxycodone in the past.  I told him that we no longer prescribe this medication in the clinic or any controlled substances to patients who are not already on them.  However I told him it was reasonable to talk to his primary care physician about the idea of oxycodone for his neuropathic pain if it is not controlled by other more conservative measures.    Past Medical History:  Diagnosis Date  . Asthma    per 2003 [facility] pulm records pfts   . Blood dyscrasia    HIV  . Clotting disorder ([place])   . Colon polyps    noted previous colonoscopy [facility]  . DDD (degenerative disc disease)    cervical spine  . Depression   . Diabetes mellitus    dx 2010  . Fever    [place] origin  . GERD (gastroesophageal reflux disease)   . Head swelling 05/23/2017  . Hep C w/o coma, chronic ([place])   . History of syphilis    noted [facility] records  . HIV infection ([place])    undetectable viral load and CD4 ct 667 as of 11/2011  . Hyperlipidemia   . Hypertension   . MRSA (methicillin resistant Staphylococcus aureus)   . Multiple sclerosis ([place])    in remission as of 11/2011 (diagnosed late 1980s)  . Pain in limb-Right Leg 10/13/2013  . 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 Pneumonia   . Prosthesis fitting  03/10/2019  . PVD (peripheral vascular disease) ([place])    Left Stent 07/02/2008  . Scalp lesion 07/26/2017  . UTI (urinary tract infection)   . Wears dentures     Past Surgical History:  Procedure Laterality Date  . ABDOMINAL AORTOGRAM W/LOWER EXTREMITY N/A 08/07/2017   Procedure: ABDOMINAL AORTOGRAM W/LOWER EXTREMITY;  Surgeon: Serafina Mitchell, MD;  Location: [facility];  Service: Cardiovascular;  Laterality: N/A;  . ABOVE KNEE LEG AMPUTATION     Right Leg  . COLONOSCOPY W/ BIOPSIES AND POLYPECTOMY    . LOWER EXTREMITY ANGIOGRAM Left 12/26/2011   Procedure: LOWER EXTREMITY ANGIOGRAM;  Surgeon: Serafina Mitchell, MD;  Location: [facility] CATH LAB;  Service: Cardiovascular;  Laterality: Left;  . LOWER EXTREMITY ANGIOGRAM Left 08/17/2017   Procedure: LOWER EXTREMITY ANGIOGRAM LEFT LEG WITH RUNOFF AND Stenting.;  Surgeon: Serafina Mitchell, MD;  Location: [facility];  Service: Vascular;  Laterality: Left;  Marland Kitchen MULTIPLE TOOTH EXTRACTIONS    . OTHER SURGICAL HISTORY     right lower ext AKA with prothesis   . OTHER SURGICAL HISTORY     left left with stents (Dr. Seward Speck)  . OTHER SURGICAL HISTORY     2003 colonoscopy 5 mm  polyp transverse colon; (2)85m  polyps in rectum-hyperplastic  . PERIPHERAL VASCULAR INTERVENTION Left 08/17/2017   Procedure: POPLITEAL STENT;  Surgeon: BSerafina Mitchell MD;  [facility];  Service: Vascular;  Laterality: Left;    Family History  Problem Relation Age of Onset  . Diabetes Mother   . Coronary artery disease Mother   . Cancer Brother        colon caner stage 4 as of 11/2011 (unknown age of onset)  . Coronary artery disease Father   . Prostate cancer Brother   . Colon cancer Brother   . Rectal cancer Neg Hx       Social History   Socioeconomic History  . Marital status: Single    Spouse name: Not on file  . Number of children: Not on file  . Years of education: 181 . Highest education level: Not on file  Occupational History  . Not on file   Tobacco Use  . Smoking status: Former Smoker    Quit date: 05/09/2007    Years since quitting: 13.1  . 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 Vaping Use: Never used  Substance and Sexual Activity  . Alcohol use: No    Alcohol/week: 0.0 standard drinks  . Drug use: No  . Sexual activity: Yes    Partners: Male    Comment: declined condoms  Other Topics Concern  . Not on file  Social History Narrative  . Not on file   Social Determinants of Health   Financial Resource Strain: Not on file  Food Insecurity: Not on file  Transportation Needs: Not on file  Physical Activity: Not on file  Stress: Not on file  Social Connections: Not on file    Allergies  Allergen Reactions  . Sulfonamide Derivatives Hives     Current Outpatient Medications:  .  atorvastatin (LIPITOR) 40 MG tablet, TAKE 1 TABLET(40 MG) BY MOUTH DAILY, Disp: 90 tablet, Rfl: 2 .  bictegravir-emtricitabine-tenofovir AF (BIKTARVY) 50-200-25 MG TABS tablet, Take 1 tablet by mouth daily., Disp: 30 tablet, Rfl: 5 .  Blood Glucose Monitoring Suppl (BAYER CONTOUR MONITOR) W/DEVICE KIT, Use to check blood sugar as instructed up to 4 times a day, Disp: 1 kit, Rfl: 0 .  clopidogrel (PLAVIX) 75 MG tablet, Take 1 tablet (75 mg total) by mouth daily., Disp: 90 tablet, Rfl: 3 .  ezetimibe (ZETIA) 10 MG tablet, Take 1 tablet (10 mg total) by mouth daily., Disp: 90 tablet, Rfl: 2 .  glucose blood (CONTOUR NEXT TEST) test strip, 1 each by Other route 5 (five) times daily. Use 5 times daily to check blood sugar., Disp: 375 each, Rfl: 3 .  ibuprofen (ADVIL) 400 MG tablet, Take by mouth., Disp: , Rfl:  .  insulin glargine, 2 Unit Dial, (TOUJEO MAX SOLOSTAR) 300 UNIT/ML Solostar Pen, Inject 80 Units into the skin in the morning. Inject 79 units into the skin every morning., Disp: 18 mL, Rfl: 2 .  insulin lispro (HUMALOG KWIKPEN) 200 UNIT/ML KwikPen, Inject 28 Units into the skin with breakfast, with lunch, and with  evening meal., Disp: 72 mL, Rfl: 1 .  Insulin Pen Needle (BD PEN NEEDLE NANO U/F) 32G X 4 MM MISC, USE AS DIRECTED FOUR TIMES DAILY. Dx code  E11.42, Disp: 200 each, Rfl: 5 .  Lancet Devices (BAYER MICROLET 2 LANCING DEVIC) MISC, Use to check blood sugar up to 3 ties a day, Disp: 1 each, Rfl: 2 .  liraglutide (VICTOZA) 18 MG/3ML SOPN, ADMINISTER 1.8 MG UNDER THE SKIN EVERY MORNING, Disp: 15 mL, Rfl: 6 .  lisinopril (ZESTRIL) 40 MG tablet, TAKE 1 TABLET BY MOUTH DAILY. HOLD IF SYSTOLIC BLOOD PRESSURE IS LESS THAN 110, Disp: 90 tablet, Rfl: 3 .  omeprazole (PRILOSEC) 40 MG capsule, Take 1 capsule (40 mg total) by mouth daily., Disp: 90 capsule, Rfl: 0 .  pioglitazone (ACTOS) 30 MG tablet, Take 1 tablet (30 mg total) by mouth daily., Disp: 90 tablet, Rfl: 3  Review of Systems  Constitutional: Negative for activity change, appetite change, chills, diaphoresis and unexpected weight change.  HENT: Negative for sinus pressure, sneezing and trouble swallowing.   Eyes: Negative for photophobia and visual disturbance.  Respiratory: Negative for chest tightness and stridor.   Cardiovascular: Negative for palpitations and leg swelling.  Gastrointestinal: Negative for abdominal distention, anal bleeding, blood in stool and constipation.  Genitourinary: Negative for difficulty urinating, dysuria, flank pain and hematuria.  Musculoskeletal: Negative for back pain, gait problem and joint swelling.  Skin: Negative for color change and pallor.  Neurological: Positive for numbness. Negative for dizziness, tremors, weakness and light-headedness.  Hematological: Negative for adenopathy. Does not bruise/bleed easily.  Psychiatric/Behavioral: Negative for agitation, behavioral problems, confusion, decreased concentration, dysphoric mood, hallucinations and sleep disturbance.       Objective:   Physical Exam Vitals and nursing note reviewed.  Constitutional:      General: He is not in acute distress.     Appearance: He is well-developed. He is not diaphoretic.  HENT:     Head: Atraumatic.     Mouth/Throat:     Pharynx: No oropharyngeal exudate.  Eyes:     General: No scleral icterus.    Conjunctiva/sclera: Conjunctivae normal.  Cardiovascular:     Rate and Rhythm: Normal rate and regular rhythm.  Pulmonary:     Effort: Pulmonary effort is normal. No respiratory distress.     Breath sounds: No wheezing.  Abdominal:     General: There is no distension.  Musculoskeletal:        General: No tenderness.     Cervical back: Normal range of motion and neck supple.  Skin:    General: Skin is warm and dry.     Coloration: Skin is not pale.     Findings: No erythema.  Neurological:     General: No focal deficit present.     Mental Status: He is alert and oriented to person, place, and time.  Psychiatric:        Mood and Affect: Mood normal.        Behavior: Behavior normal.        Thought Content: Thought content normal.        Judgment: Judgment normal.        Assessment & Plan:    HIV: highly R virus:   Continue Biktarvy alone with TWO fully active drugs and 184V sensitizing virus to TNF, DC AZT, and check labs in one month   Syphilis: Serofast   History of below the knee amputation: Followed by vascular also  DM: followed closely in IM clinic A1c   PVD by vascular surgery and no need for interventions there is see him again in 6 months  HTN: followed by PCP . Vitals:   07/01/20 0921  BP: (!) 161/83  Pulse: 67  Temp: (!) 97.4 F (36.3 C)  SpO2: 100%   Neuropathic pain and he could have phantom limb pain as well: Is reasonable for  him to be on an opiate but we are negative prescribe it from this clinic would ask him to talk to primary care regarding this.  COVID prevention he has had 3 vaccines and asked me whether he should have a fourth since his last one was roughly 4 months ago.  I am supportive of him getting a fourth vaccine.

## 2020-07-29 ENCOUNTER — Other Ambulatory Visit: Payer: Self-pay

## 2020-07-29 ENCOUNTER — Other Ambulatory Visit: Payer: Medicare Other

## 2020-07-29 DIAGNOSIS — B20 Human immunodeficiency virus [HIV] disease: Secondary | ICD-10-CM

## 2020-07-30 LAB — T-HELPER CELL (CD4) - (RCID CLINIC ONLY)
CD4 % Helper T Cell: 19 % — ABNORMAL LOW (ref 33–65)
CD4 T Cell Abs: 566 /uL (ref 400–1790)

## 2020-08-02 LAB — CBC WITH DIFFERENTIAL/PLATELET
Absolute Monocytes: 474 cells/uL (ref 200–950)
Basophils Absolute: 52 cells/uL (ref 0–200)
Basophils Relative: 0.7 %
Eosinophils Absolute: 170 cells/uL (ref 15–500)
Eosinophils Relative: 2.3 %
HCT: 43.1 % (ref 38.5–50.0)
Hemoglobin: 14.8 g/dL (ref 13.2–17.1)
Lymphs Abs: 3522 cells/uL (ref 850–3900)
MCH: 35.9 pg — ABNORMAL HIGH (ref 27.0–33.0)
MCHC: 34.3 g/dL (ref 32.0–36.0)
MCV: 104.6 fL — ABNORMAL HIGH (ref 80.0–100.0)
MPV: 9.2 fL (ref 7.5–12.5)
Monocytes Relative: 6.4 %
Neutro Abs: 3182 cells/uL (ref 1500–7800)
Neutrophils Relative %: 43 %
Platelets: 213 10*3/uL (ref 140–400)
RBC: 4.12 10*6/uL — ABNORMAL LOW (ref 4.20–5.80)
RDW: 12 % (ref 11.0–15.0)
Total Lymphocyte: 47.6 %
WBC: 7.4 10*3/uL (ref 3.8–10.8)

## 2020-08-02 LAB — COMPLETE METABOLIC PANEL WITH GFR
AG Ratio: 1.6 (calc) (ref 1.0–2.5)
ALT: 23 U/L (ref 9–46)
AST: 24 U/L (ref 10–35)
Albumin: 4.4 g/dL (ref 3.6–5.1)
Alkaline phosphatase (APISO): 58 U/L (ref 35–144)
BUN: 12 mg/dL (ref 7–25)
CO2: 26 mmol/L (ref 20–32)
Calcium: 9.6 mg/dL (ref 8.6–10.3)
Chloride: 101 mmol/L (ref 98–110)
Creat: 0.93 mg/dL (ref 0.70–1.18)
GFR, Est African American: 95 mL/min/{1.73_m2} (ref >=60)
GFR, Est Non African American: 82 mL/min/{1.73_m2} (ref >=60)
Globulin: 2.7 g/dL (calc) (ref 1.9–3.7)
Glucose, Bld: 287 mg/dL — ABNORMAL HIGH (ref 65–99)
Potassium: 4 mmol/L (ref 3.5–5.3)
Sodium: 139 mmol/L (ref 135–146)
Total Bilirubin: 0.6 mg/dL (ref 0.2–1.2)
Total Protein: 7.1 g/dL (ref 6.1–8.1)

## 2020-08-02 LAB — HIV-1 RNA QUANT-NO REFLEX-BLD
HIV 1 RNA Quant: <20 Copies/mL — ABNORMAL HIGH
HIV-1 RNA Quant, Log: <1.3 Log cps/mL — ABNORMAL HIGH

## 2020-08-11 ENCOUNTER — Encounter: Payer: Self-pay | Admitting: *Deleted

## 2020-08-11 NOTE — Progress Notes (Unsigned)

## 2020-08-12 ENCOUNTER — Ambulatory Visit (INDEPENDENT_AMBULATORY_CARE_PROVIDER_SITE_OTHER): Payer: Medicare (Managed Care)

## 2020-08-12 ENCOUNTER — Other Ambulatory Visit: Payer: Self-pay

## 2020-08-12 ENCOUNTER — Encounter: Payer: Self-pay | Admitting: Infectious Disease

## 2020-08-12 ENCOUNTER — Ambulatory Visit (INDEPENDENT_AMBULATORY_CARE_PROVIDER_SITE_OTHER): Payer: Medicare (Managed Care) | Admitting: Infectious Disease

## 2020-08-12 VITALS — BP 168/72 | HR 67 | Wt 180.2 lb

## 2020-08-12 DIAGNOSIS — Z794 Long term (current) use of insulin: Secondary | ICD-10-CM

## 2020-08-12 DIAGNOSIS — E1169 Type 2 diabetes mellitus with other specified complication: Secondary | ICD-10-CM

## 2020-08-12 DIAGNOSIS — B20 Human immunodeficiency virus [HIV] disease: Secondary | ICD-10-CM | POA: Diagnosis not present

## 2020-08-12 DIAGNOSIS — R768 Other specified abnormal immunological findings in serum: Secondary | ICD-10-CM

## 2020-08-12 DIAGNOSIS — I10 Essential (primary) hypertension: Secondary | ICD-10-CM | POA: Diagnosis not present

## 2020-08-12 DIAGNOSIS — Z23 Encounter for immunization: Secondary | ICD-10-CM

## 2020-08-12 DIAGNOSIS — I739 Peripheral vascular disease, unspecified: Secondary | ICD-10-CM

## 2020-08-12 DIAGNOSIS — Z7185 Encounter for immunization safety counseling: Secondary | ICD-10-CM

## 2020-08-12 NOTE — Progress Notes (Signed)
   Covid-19 Vaccination Clinic  Name:  Nathan [name]    MRN: [id number] DOB: 1950-07-26  08/12/2020  [name] was observed post Covid-19 immunization for 15 minutes without incident. He was provided with Vaccine Information Sheet and instruction to access the V-Safe system.   [name] was instructed to call 911 with any severe reactions post vaccine: Marland Kitchen Difficulty breathing  . Swelling of face and throat  . A fast heartbeat  . A bad rash all over body  . Dizziness and weakness      Carlean Purl, RN'

## 2020-08-12 NOTE — Progress Notes (Signed)
Chief complaint: followup for HIV with medicines simplified  Subjective:    Patient ID: Nathan [name], male    DOB: 1951-01-23, 70 y.o.   MRN: [id number]  HPI  Nathan [name] is a 70 y.o. male whose HIV has been perfectly suppressed on Biktarvy and BID AZT  We ran [employer] which is shown below:      He is seen internal medicine for his primary care and also seen vascular surgery recently and they are following his peripheral vascular disease closely.  I had been reluctant to take him off AZT and to simplify him to [facility] but I think now I feel quite comfortable doing so.  He returns to clinic and has had labs and remain suppressed he has not noticed any difference since coming off the AZT.  He inquired about Shingrix vaccine because because apparently he only had 1 dose years ago.    Past Medical History:  Diagnosis Date  . Asthma    per 2003 [facility] pulm records pfts   . Blood dyscrasia    HIV  . Clotting disorder ([place])   . Colon polyps    noted previous colonoscopy [facility]  . DDD (degenerative disc disease)    cervical spine  . Depression   . Diabetes mellitus    dx 2010  . Fever    [place]  . GERD (gastroesophageal reflux disease)   . Head swelling 05/23/2017  . Hep C w/o coma, chronic ([place])   . History of syphilis    noted [facility] records  . HIV infection ([place])    undetectable viral load and CD4 ct 667 as of 11/2011  . Hyperlipidemia   . Hypertension   . MRSA (methicillin resistant Staphylococcus aureus)   . Multiple sclerosis ([place])    in remission as of 11/2011 (diagnosed late 1980s)  . Pain in limb-Right Leg 10/13/2013  . 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[place])    2002  . Pneumonia   . Prosthesis fitting 03/10/2019  . PVD (peripheral vascular disease) ([place])    Left Stent 07/02/2008  . Scalp lesion 07/26/2017  . UTI (urinary tract infection)   . Wears dentures     Past Surgical History:  Procedure Laterality Date  . ABDOMINAL AORTOGRAM  W/LOWER EXTREMITY N/A 08/07/2017   Procedure: ABDOMINAL AORTOGRAM W/LOWER EXTREMITY;  Surgeon: Serafina Mitchell, MD;  Location: [place] CV LAB;  Service: Cardiovascular;  Laterality: N/A;  . ABOVE KNEE LEG AMPUTATION     Right Leg  . COLONOSCOPY W/ BIOPSIES AND POLYPECTOMY    . LOWER EXTREMITY ANGIOGRAM Left 12/26/2011   Procedure: LOWER EXTREMITY ANGIOGRAM;  Surgeon: Serafina Mitchell, MD;  Location: [facility] CATH LAB;  Service: Cardiovascular;  Laterality: Left;  . LOWER EXTREMITY ANGIOGRAM Left 08/17/2017   Procedure: LOWER EXTREMITY ANGIOGRAM LEFT LEG WITH RUNOFF AND Stenting.;  Surgeon: Serafina Mitchell, MD;  Location: [place];  Service: Vascular;  Laterality: Left;  Marland Kitchen MULTIPLE TOOTH EXTRACTIONS    . OTHER SURGICAL HISTORY     right lower ext AKA with prothesis   . OTHER SURGICAL HISTORY     left left with stents (Dr. Seward Speck)  . OTHER SURGICAL HISTORY     2003 colonoscopy 5 mm polyp transverse colon; (2)58m  polyps in rectum-hyperplastic  . PERIPHERAL VASCULAR INTERVENTION Left 08/17/2017   Procedure: POPLITEAL STENT;  Surgeon: BSerafina Mitchell MD;  [facility];  Service: Vascular;  Laterality: Left;    Family History  Problem Relation Age of  Onset  . Diabetes Mother   . Coronary artery disease Mother   . Cancer Brother        colon caner stage 4 as of 11/2011 (unknown age of onset)  . Coronary artery disease Father   . Prostate cancer Brother   . Colon cancer Brother   . Rectal cancer Neg Hx       Social History   Socioeconomic History  . Marital status: Single    Spouse name: Not on file  . Number of children: Not on file  . Years of education: 35  . Highest education level: Not on file  Occupational History  . Not on file  Tobacco Use  . Smoking status: Former Smoker    Quit date: 05/09/2007    Years since quitting: 13.2  . Smokeless tobacco: Former Systems developer    Types: Secondary school teacher  . Vaping Use: Never used  Substance and Sexual Activity  . Alcohol use: No     Alcohol/week: 0.0 standard drinks  . Drug use: No  . Sexual activity: Yes    Partners: Male    Comment: declined condoms  Other Topics Concern  . Not on file  Social History Narrative  . Not on file   Social Determinants of Health   Financial Resource Strain: Not on file  Food Insecurity: Not on file  Transportation Needs: Not on file  Physical Activity: Not on file  Stress: Not on file  Social Connections: Not on file    Allergies  Allergen Reactions  . Sulfonamide Derivatives Hives     Current Outpatient Medications:  .  atorvastatin (LIPITOR) 40 MG tablet, TAKE 1 TABLET(40 MG) BY MOUTH DAILY, Disp: 90 tablet, Rfl: 2 .  bictegravir-emtricitabine-tenofovir AF (BIKTARVY) 50-200-25 MG TABS tablet, Take 1 tablet by mouth daily., Disp: 30 tablet, Rfl: 5 .  Blood Glucose Monitoring Suppl (BAYER CONTOUR MONITOR) W/DEVICE KIT, Use to check blood sugar as instructed up to 4 times a day, Disp: 1 kit, Rfl: 0 .  clopidogrel (PLAVIX) 75 MG tablet, Take 1 tablet (75 mg total) by mouth daily., Disp: 90 tablet, Rfl: 3 .  ezetimibe (ZETIA) 10 MG tablet, Take 1 tablet (10 mg total) by mouth daily., Disp: 90 tablet, Rfl: 2 .  glucose blood (CONTOUR NEXT TEST) test strip, 1 each by Other route 5 (five) times daily. Use 5 times daily to check blood sugar., Disp: 375 each, Rfl: 3 .  ibuprofen (ADVIL) 400 MG tablet, Take by mouth., Disp: , Rfl:  .  insulin glargine, 2 Unit Dial, (TOUJEO MAX SOLOSTAR) 300 UNIT/ML Solostar Pen, Inject 80 Units into the skin in the morning. Inject 79 units into the skin every morning., Disp: 18 mL, Rfl: 2 .  insulin lispro (HUMALOG KWIKPEN) 200 UNIT/ML KwikPen, Inject 28 Units into the skin with breakfast, with lunch, and with evening meal., Disp: 72 mL, Rfl: 1 .  Insulin Pen Needle (BD PEN NEEDLE NANO U/F) 32G X 4 MM MISC, USE AS DIRECTED FOUR TIMES DAILY. Dx code  E11.42, Disp: 200 each, Rfl: 5 .  Lancet Devices (BAYER MICROLET 2 LANCING DEVIC) MISC, Use to check  blood sugar up to 3 ties a day, Disp: 1 each, Rfl: 2 .  liraglutide (VICTOZA) 18 MG/3ML SOPN, ADMINISTER 1.8 MG UNDER THE SKIN EVERY MORNING, Disp: 15 mL, Rfl: 6 .  lisinopril (ZESTRIL) 40 MG tablet, TAKE 1 TABLET BY MOUTH DAILY. HOLD IF SYSTOLIC BLOOD PRESSURE IS LESS THAN 110, Disp: 90 tablet, Rfl: 3 .  omeprazole (PRILOSEC) 40 MG capsule, Take 1 capsule (40 mg total) by mouth daily., Disp: 90 capsule, Rfl: 0 .  pioglitazone (ACTOS) 30 MG tablet, Take 1 tablet (30 mg total) by mouth daily., Disp: 90 tablet, Rfl: 3  Review of Systems  Constitutional: Negative for activity change, appetite change, chills, diaphoresis and unexpected weight change.  HENT: Negative for hearing loss, sinus pressure, sneezing and trouble swallowing.   Eyes: Negative for photophobia and visual disturbance.  Respiratory: Negative for chest tightness and stridor.   Cardiovascular: Negative for palpitations and leg swelling.  Gastrointestinal: Negative for abdominal distention, anal bleeding, blood in stool and constipation.  Genitourinary: Negative for difficulty urinating, dysuria, flank pain and hematuria.  Musculoskeletal: Negative for back pain, gait problem and joint swelling.  Skin: Negative for color change and pallor.  Neurological: Positive for numbness. Negative for dizziness, tremors, weakness and light-headedness.  Hematological: Negative for adenopathy. Does not bruise/bleed easily.  Psychiatric/Behavioral: Negative for agitation, behavioral problems, confusion, decreased concentration, dysphoric mood, hallucinations and sleep disturbance.       Objective:   Physical Exam Vitals and nursing note reviewed.  Constitutional:      General: He is not in acute distress.    Appearance: He is well-developed. He is not diaphoretic.  HENT:     Head: Atraumatic.     Mouth/Throat:     Pharynx: No oropharyngeal exudate.  Eyes:     General: No scleral icterus.    Extraocular Movements: Extraocular movements  intact.     Conjunctiva/sclera: Conjunctivae normal.  Cardiovascular:     Rate and Rhythm: Normal rate and regular rhythm.  Pulmonary:     Effort: Pulmonary effort is normal. No respiratory distress.     Breath sounds: No wheezing.  Abdominal:     General: There is no distension.  Musculoskeletal:        General: No tenderness.     Cervical back: Normal range of motion and neck supple.  Skin:    General: Skin is warm and dry.     Coloration: Skin is not pale.     Findings: No erythema.  Neurological:     General: No focal deficit present.     Mental Status: He is alert and oriented to person, place, and time.  Psychiatric:        Mood and Affect: Mood normal.        Behavior: Behavior normal.        Thought Content: Thought content normal.        Judgment: Judgment normal.        Assessment & Plan:    HIV: highly R virus: Staying suppressed on Biktarvy alone with 2 fully active drugs in 180 4V sensitizing virus to TNF   Syphilis: Serofast   History of below the knee amputation: Followed by vascular also  DM: followed closely in IM clinic A1c   PVD by vascular surgery and no need for interventions there is see him again in 6 months  HTN: followed by PCP . There were no vitals filed for this visit.   COVID prevention gave him his fourth dose booster today from [facility] I have written a prescription for Shingrix for him  I spent greater than 30 minutes with the patient including greater than 50% of time in face to face counsel of the patient and in coordination of their care.

## 2020-08-13 ENCOUNTER — Encounter: Payer: Self-pay | Admitting: Internal Medicine

## 2020-08-13 NOTE — Progress Notes (Signed)
Things That May Be Affecting Your Health:  Alcohol  Hearing loss  Pain    Depression  Home Safety  Sexual Health  X Diabetes  Lack of physical activity  Stress   Difficulty with daily activities  Loneliness  Tiredness   Drug use X Medicines X Tobacco use   Falls  Motor Vehicle Safety  Weight   Food choices  Oral Health  Other    YOUR PERSONALIZED HEALTH PLAN : 1. Schedule your next subsequent Medicare Wellness visit in one year 2. Attend all of your regular appointments to address your medical issues 3. Complete the preventative screenings and services   Annual Wellness Visit   Medicare Covered Preventative Screenings and [place] Men and Women Who How Often Need? Date of Last Service Action  Abdominal Aortic Aneurysm Adults with AAA risk factors Once Y     Alcohol Misuse and Counseling All Adults Screening once a year if no alcohol misuse. Counseling up to 4 face to face sessions.     Bone Density Measurement  Adults at risk for osteoporosis Once every 2 yrs      Lipid Panel Z13.6 All adults without CV disease Once every 5 yrs       Colorectal Cancer   Stool sample or  Colonoscopy All adults 33 and older   Once every year  Every 10 years        Depression All Adults Once a year  Today   Diabetes Screening Blood glucose, post glucose load, or GTT Z13.1  All adults at risk  Pre-diabetics  Once per year  Twice per year      Diabetes  Self-Management Training All adults Diabetics 10 hrs first year; 2 hours subsequent years. Requires Copay     Glaucoma  Diabetics  Family history of glaucoma  African Americans 39 yrs +  Hispanic Americans 64 yrs + Annually - requires coppay      Hepatitis C Z72.89 or F19.20  High Risk for HCV  Born between 1945 and 1965  Annually  Once      HIV Z11.4 All adults based on risk  Annually btw ages 14 & 32 regardless of risk  Annually > 65 yrs if at increased risk      Lung Cancer Screening  Asymptomatic adults aged 83-77 with 30 pack yr history and current smoker OR quit within the last 15 yrs Annually Must have counseling and shared decision making documentation before first screen      Medical Nutrition Therapy Adults with   Diabetes  Renal disease  Kidney transplant within past 3 yrs 3 hours first year; 2 hours subsequent years     Obesity and Counseling All adults Screening once a year Counseling if BMI 30 or higher  Today   Tobacco Use Counseling Adults who use tobacco  Up to 8 visits in one year Y    Vaccines Z23  Hepatitis B  Influenza   Pneumonia  Adults   Once  Once every flu season  Two different vaccines separated by one year     Next Annual Wellness Visit People with Medicare Every year  Today     Services & Screenings Women Who How Often Need  Date of Last Service Action  Mammogram  Z12.31 Women over 23 One baseline ages 64-39. Annually ager 40 yrs+      Pap tests All women Annually if high risk. Every 2 yrs for normal risk women  Screening for cervical cancer with   Pap (Z01.419 nl or Z01.411abnl) &  HPV Z11.51 Women aged 15 to 63 Once every 5 yrs     Screening pelvic and breast exams All women Annually if high risk. Every 2 yrs for normal risk women     Sexually Transmitted Diseases  Chlamydia  Gonorrhea  Syphilis All at risk adults Annually for non pregnant females at increased risk         [place] Men Who How Ofter Need  Date of Last Service Action  Prostate Cancer - DRE & PSA Men over 50 Annually.  DRE might require a copay.        Sexually Transmitted Diseases  Syphilis All at risk adults Annually for men at increased risk      Health Maintenance List Health Maintenance  Topic Date Due  . FOOT EXAM  06/08/2020  . OPHTHALMOLOGY EXAM  06/08/2020  . HEMOGLOBIN A1C  08/11/2020  . TETANUS/TDAP  10/05/2020 (Originally 12/13/1969)  . INFLUENZA VACCINE  12/06/2020  . COVID-19 Vaccine (4 - Booster for  Pfizer series) 02/11/2021  . LIPID PANEL  05/13/2021  . COLONOSCOPY (Pts 45-52yrs Insurance coverage will need to be confirmed)  05/15/2021  . Hepatitis C Screening  Completed  . PNA vac Low Risk Adult  Completed  . HPV VACCINES  Aged Out

## 2020-08-30 ENCOUNTER — Encounter: Payer: Self-pay | Admitting: Student

## 2020-08-30 ENCOUNTER — Other Ambulatory Visit: Payer: Self-pay

## 2020-08-30 ENCOUNTER — Ambulatory Visit (INDEPENDENT_AMBULATORY_CARE_PROVIDER_SITE_OTHER): Payer: Medicare Other | Admitting: Student

## 2020-08-30 ENCOUNTER — Other Ambulatory Visit: Payer: Self-pay | Admitting: Student

## 2020-08-30 VITALS — BP 121/63 | HR 65 | Temp 97.9°F | Ht 73.0 in | Wt 180.6 lb

## 2020-08-30 DIAGNOSIS — E1165 Type 2 diabetes mellitus with hyperglycemia: Secondary | ICD-10-CM

## 2020-08-30 DIAGNOSIS — I739 Peripheral vascular disease, unspecified: Secondary | ICD-10-CM

## 2020-08-30 DIAGNOSIS — IMO0002 Reserved for concepts with insufficient information to code with codable children: Secondary | ICD-10-CM

## 2020-08-30 DIAGNOSIS — E1142 Type 2 diabetes mellitus with diabetic polyneuropathy: Secondary | ICD-10-CM

## 2020-08-30 DIAGNOSIS — I10 Essential (primary) hypertension: Secondary | ICD-10-CM

## 2020-08-30 DIAGNOSIS — F17201 Nicotine dependence, unspecified, in remission: Secondary | ICD-10-CM | POA: Insufficient documentation

## 2020-08-30 DIAGNOSIS — E785 Hyperlipidemia, unspecified: Secondary | ICD-10-CM

## 2020-08-30 DIAGNOSIS — E7800 Pure hypercholesterolemia, unspecified: Secondary | ICD-10-CM

## 2020-08-30 DIAGNOSIS — B20 Human immunodeficiency virus [HIV] disease: Secondary | ICD-10-CM

## 2020-08-30 DIAGNOSIS — E1169 Type 2 diabetes mellitus with other specified complication: Secondary | ICD-10-CM

## 2020-08-30 DIAGNOSIS — Z122 Encounter for screening for malignant neoplasm of respiratory organs: Secondary | ICD-10-CM

## 2020-08-30 DIAGNOSIS — E1151 Type 2 diabetes mellitus with diabetic peripheral angiopathy without gangrene: Secondary | ICD-10-CM

## 2020-08-30 DIAGNOSIS — Z794 Long term (current) use of insulin: Secondary | ICD-10-CM

## 2020-08-30 DIAGNOSIS — K219 Gastro-esophageal reflux disease without esophagitis: Secondary | ICD-10-CM

## 2020-08-30 DIAGNOSIS — M79605 Pain in left leg: Secondary | ICD-10-CM

## 2020-08-30 DIAGNOSIS — G8929 Other chronic pain: Secondary | ICD-10-CM | POA: Insufficient documentation

## 2020-08-30 LAB — POCT GLYCOSYLATED HEMOGLOBIN (HGB A1C): Hemoglobin A1C: 8.9 % — AB (ref 4.0–5.6)

## 2020-08-30 LAB — GLUCOSE, CAPILLARY: Glucose-Capillary: 219 mg/dL — ABNORMAL HIGH (ref 70–99)

## 2020-08-30 MED ORDER — BIKTARVY 50-200-25 MG PO TABS
1.0000 | ORAL_TABLET | Freq: Every day | ORAL | 5 refills | Status: DC
Start: 2020-08-30 — End: 2021-02-24

## 2020-08-30 MED ORDER — CONTOUR NEXT TEST VI STRP: 1.0000 | ORAL_STRIP | Freq: Every day | 3 refills | Status: DC

## 2020-08-30 MED ORDER — BD PEN NEEDLE NANO U/F 32G X 4 MM MISC: 5 refills | Status: DC

## 2020-08-30 MED ORDER — CILOSTAZOL 50 MG PO TABS: 50.0000 mg | ORAL_TABLET | Freq: Two times a day (BID) | ORAL | 3 refills | Status: DC

## 2020-08-30 MED ORDER — TOUJEO MAX SOLOSTAR 300 UNIT/ML ~~LOC~~ SOPN
84.0000 [IU] | PEN_INJECTOR | Freq: Every morning | SUBCUTANEOUS | 2 refills | Status: DC
Start: 2020-08-30 — End: 2020-09-13

## 2020-08-30 MED ORDER — ATORVASTATIN CALCIUM 40 MG PO TABS: ORAL_TABLET | ORAL | 2 refills | Status: DC

## 2020-08-30 MED ORDER — HUMALOG KWIKPEN 200 UNIT/ML ~~LOC~~ SOPN: 30.0000 [IU] | PEN_INJECTOR | Freq: Three times a day (TID) | SUBCUTANEOUS | 1 refills | Status: DC

## 2020-08-30 MED ORDER — VICTOZA 18 MG/3ML ~~LOC~~ SOPN: PEN_INJECTOR | SUBCUTANEOUS | 6 refills | Status: DC

## 2020-08-30 NOTE — Assessment & Plan Note (Addendum)
Patient states his sugars remain persistently high, ~250 fasting and up to 300 during the daytime before meals despite increase in Glargine to 79 units daily. He was instructed to increase humalog from 25 to 28 units TID WC, although had been giving 25-27 units and occasionally missing lunch-time dosing. He also notes some troubles with his humalog pen and has signs of lipodystrophy on examination. He denies any symptoms of hypo- or hyperglycemia. He notes he was told never to take Metformin again by a provider in the past for what appeared to be lactic acidosis back ~2016 and he had not had success with CBG monitoring in the past due to issues with adhesion.   - Will increase glargine from 79 to 84 units daily  - Will increase humalog to 30 units TID WC  - continue liraglutide 1.8mg  daily  - continue pioglitazone 30mg  daily  - Will refer to [facility] for education on use of insulin pens and glucose monitoring  - referral sent to ophthalmology

## 2020-08-30 NOTE — Patient Instructions (Addendum)
Mr. Parekh,  Your left leg pain is likely due to damage to the nerves from decreased blood flow and high blood sugars.   I prescribed Cilostazol to hopefully improve your pain. Please take 1 pill in the morning and 1 pill in the evening. We can increase the dosing if you find relief with this.   Your A1c was high at 8.9 today.   Please increase your Toujeo to 84 units daily  Please increase your Humalog dose to 30 units three times daily, with meals. Do NOT give yourself a humalog dose if you do not intend to eat a meal.  I will refer you to vascular surgery and to [name]. You should receive calls to schedule these appointments.   Otherwise, I am glad to hear you are doing well!  Please call [phone] to schedule a follow up appointment in 2 weeks with your glucometer or if you have any questions or concerns.   Thank you and take care,  Dr. Konrad Penta    Peripheral Vascular Disease  Peripheral vascular disease (PVD) is a disease of the blood vessels that carry blood from the heart to the rest of the body. PVD is also called peripheral artery disease (PAD) or poor circulation. PVD affects most of the body. But it affects the legs and feet the most. PVD can lead to acute limb ischemia. This happens when there is a sudden stop of blood flow to an arm or leg. This is a medical emergency. What are the causes? The most common cause of PVD is a buildup of a fatty substance (plaque) inside your arteries. This decreases blood flow. Plaque can break off and block blood in a smaller artery. This can lead to acute limb ischemia. Other common causes of PVD include:  Blood clots inside the blood vessels.  Injuries to blood vessels.  Irritation and swelling of blood vessels.  Sudden tightening of the blood vessel (spasms). What increases the risk?  A family history of PVD.  Medical conditions, including: ? High cholesterol. ? Diabetes. ? High blood pressure. ? Heart disease. ? Past  problems with blood clots. ? Past injury, such as burns or a broken bone.  Other conditions, such as: ? Buerger's disease. This is caused by swollen or irritated blood vessels in your hands and feet. ? Arthritis. ? Birth defects that affect the arteries in your legs. ? Kidney disease.  Using tobacco or nicotine products.  Not getting enough exercise.  Being very overweight (obese).  Being 5 years old or older. What are the signs or symptoms?  Cramps in your butt, legs, and feet.  Pain and weakness in your legs when you are active that goes away when you rest.  Leg pain when at rest.  Leg numbness, tingling, or weakness.  Coldness in a leg or foot, especially when compared with the other leg or foot.  Skin or hair changes. These can include: ? Hair loss. ? Shiny skin. ? Pale or bluish skin. ? Thick toenails.  Being unable to get or keep an erection.  Tiredness (fatigue).  Weak pulse or no pulse in the feet.  Wounds and sores on the toes, feet, or legs. These take longer to heal. How is this treated? Underlying causes are treated first. Other conditions, like diabetes, high cholesterol, and blood pressure, are also treated. Treatment may include:  Lifestyle changes, such as: ? Quitting smoking. ? Getting regular exercise. ? Having a diet low in fat and cholesterol. ? Not drinking alcohol.  Taking medicines, such as: ? Blood thinners. ? Medicines to improve blood flow. ? Medicines to improve your blood cholesterol.  Procedures to: ? Open the arteries and restore blood flow. ? Insert a small mesh tube (stent) to keep a blocked vessel open. ? Create a new path for blood to flow to the body (peripheral bypass). ? Remove dead tissue from a wound. ? Remove an affected leg or arm. Follow these instructions at home: Medicines  Take over-the-counter and prescription medicines only as told by your doctor.  If you are taking blood thinners: ? Talk with your  doctor before you take any medicines that have aspirin, or NSAIDs, such as ibuprofen. ? Take medicines exactly as told. Take them at the same time each day. ? Avoid doing things that could hurt or bruise you. Take action to prevent falls. ? Wear an alert bracelet or carry a card that shows you are taking blood thinners. Lifestyle  Get regular exercise. Ask your doctor about how to stay active.  Talk with your doctor about keeping a healthy weight. If needed, ask about losing weight.  Eat a diet that is low in fat and cholesterol. If you need help, talk with your doctor.  Do not drink alcohol.  Do not smoke or use any products that contain nicotine or tobacco. If you need help quitting, ask your doctor.      General instructions  Take good care of your feet. To do this: ? Wear shoes that fit well and feel good. ? Check your feet often for any cuts or sores.  Get a flu shot (influenza vaccine) each year.  Keep all follow-up visits. Where to find more information  Society for Vascular Surgery: vascular.org  American Heart Association: heart.org  National Heart, Lung, and Blood Institute: https://www.[place]man-[place].biz/ Contact a doctor if:  You have cramps in your legs when you walk.  You have leg pain when you rest.  Your leg or foot feels cold.  Your skin changes.  You cannot get or keep an erection.  You have cuts or sores on your legs or feet that do not heal. Get help right away if:  You have sudden changes in the color and feeling of your arms or legs, such as: ? Your arm or leg turns cold, numb, and blue. ? Your arm or leg becomes red, warm, swollen, painful, or numb.  You have any signs of a stroke. "BE FAST" is an easy way to remember the main warning signs: ? B - Balance. Dizziness, sudden trouble walking, or loss of balance. ? E - Eyes. Trouble seeing or a change in how you see. ? F - Face. Sudden weakness or loss of feeling of the face. The face or eyelid may droop on  one side. ? A - Arms. Weakness or loss of feeling in an arm. This happens all of a sudden and most often on one side of the body. ? S - Speech. Sudden trouble speaking, slurred speech, or trouble understanding what people say. ? T - Time. Time to call emergency services. Write down what time symptoms started.  You have other signs of a stroke, such as: ? A sudden, very bad headache with no known cause. ? Feeling like you may vomit (nausea). ? Vomiting. ? A seizure.  You have chest pain or trouble breathing. These symptoms may be an emergency. Get help right away. Call your local emergency services (911 in the [place]  Do not wait to see if  the symptoms will go away.  Do not drive yourself to the hospital. Summary  Peripheral vascular disease (PVD) is a disease of the blood vessels.  PVD affects the legs and feet the most.  Symptoms may include leg pain or leg numbness, tingling, and weakness.  Treatment may include lifestyle changes, medicines, and procedures. This information is not intended to replace advice given to you by your health care provider. Make sure you discuss any questions you have with your health care provider. Document Revised: 10/27/2019 Document Reviewed: 10/27/2019 Elsevier Patient Education  [place].

## 2020-08-30 NOTE — Assessment & Plan Note (Signed)
-   Continue atorvastatin 40mg  and Zetia 10mg  daily

## 2020-08-30 NOTE — Progress Notes (Signed)
CC: Left Leg Pain   HPI:  [name] is a 70 y.o. gentleman w/ PMHx HIV, DM, HTN, HLD, PVD and MS in remission presenting for a routine physical examination. His only complaint is of left leg pain. He says this pain has been ongoing for many months although seems to be worse recently, especially with exertion. His pain is from the mid-shin down throughout his entire foot. He awakens during the night to throw his legs over the side of the bed, which seems to help. He intermittently has purplish discoloration of his right foot and notes numbness and tingling of his left foot and more mild in his bilateral hands. He does sometimes have difficulty with fine motor movements of his hands. He denies any improvement of his symptoms on gabapentin in the past and denies improvement with high doses of ibuprofen. He says his blood sugars have continued to run high, between 250-300 throughout the day although he denies symptoms of hypo- or hyperglycemia.   Past Medical History:  Diagnosis Date  . Asthma    per 2003 [facility] pulm records pfts   . Blood dyscrasia    HIV  . Clotting disorder ([place])   . Colon polyps    noted previous colonoscopy [facility]  . DDD (degenerative disc disease)    cervical spine  . Depression   . Diabetes mellitus    dx 2010  . Fever    unknwon origin  . GERD (gastroesophageal reflux disease)   . Head swelling 05/23/2017  . Hep C w/o coma, chronic ([place])   . History of syphilis    noted [facility] records  . HIV infection ([place])    undetectable viral load and CD4 ct 667 as of 11/2011  . Hyperlipidemia   . Hypertension   . MRSA (methicillin resistant Staphylococcus aureus)   . Multiple sclerosis ([place])    in remission as of 11/2011 (diagnosed late 1980s)  . Pain in limb-Right Leg 10/13/2013  . 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[place])    2002  . Pneumonia   . Prosthesis fitting 03/10/2019  . PVD (peripheral vascular disease) ([place])    Left Stent 07/02/2008  . Scalp lesion  07/26/2017  . UTI (urinary tract infection)   . Wears dentures    Review of Systems:  All others negative except as noted above in HPI; specifically, no dysuria, polydipsia, nausea, abdominal pain, changes in bowels, confusion, weakness or diaphoresis.   Physical Exam:  Vitals:   08/30/20 1021 08/30/20 1044  BP: (!) 133/55 121/63  Pulse: 74 65  Temp: 97.9 F (36.6 C)   TempSrc: Oral   SpO2: 97%   Weight: 180 lb 9.6 oz (81.9 kg)   Height: 6\' 1"  (1.854 m)    General: Patient appears well. No acute distress. Eyes: Sclera non-icteric. No conjunctival injection.  HENT: MMM. No nasal discharge. Respiratory: Lungs are CTA, bilaterally. No wheezes, rales, or rhonchi.  Cardiovascular: Regular rate and rhythm. No murmurs, rubs, or gallops. No lower extremity edema. DP pulse is 1+, PT pulse is not palpable, and popliteal pulse is 2+ in LLE.  Musculoskeletal: Right AKA. Patient has full ROM of his LLE. Strength is 5/5 in bilateral upper extremities and LLE.  Neurological: Sensation to monofilament testing is absent in the LLE from the tips of the toes to the mid-shin. Sensation to monofilament of the bilateral hands is intact although patient endorses dysesthesia of his B/L UE's.  Abdominal: Soft and non-tender to palpation. Bowel sounds intact. No  rebound or guarding. Skin: There is mild skin flaking over the left foot and shin. Left lower leg and foot are warm to the touch although with purplish discoloration. There is a well-healing, pea-sized ulceration without drainage on the posterior left heel. There are raised areas concerning for lipodystrophy on the lower abdomen.  Psych: Normal affect. Normal tone of voice.   Assessment & Plan:   See Encounters Tab for problem based charting.  Patient discussed with Dr. Heber Hurlock.  Jeralyn Bennett, MD 08/30/2020, 11:36 AM Pager: [phone]

## 2020-08-30 NOTE — Assessment & Plan Note (Signed)
See "peripheral vascular disease". Patient's symptoms are most consistent with claudication; however, he does also have significant neuropathy of the lower > upper extremities. He does not have any other symptoms to suggest multiple sclerosis flare, although cannot rule this out.   - Assess whether symptoms improve with Cilostazol  - If symptoms persist, consider Lyrica (poor response to gabapentin in the past) and further polyneuropathy workup

## 2020-08-30 NOTE — Assessment & Plan Note (Signed)
Patient endorses smoking 1-2 PPD for ~40 years. He quit smoking 13 years ago and has not had CT imaging of the chest within the last year.   - Will check low-dose lung cancer screening CT

## 2020-08-30 NOTE — Assessment & Plan Note (Addendum)
Patient's exertional LLE pain sounds most consistent with claudication; however, his exam is consistent with neuropathy and long-standing type II DM is very likely contributing, and cannot rule out MS flare. Patient last followed with Dr. Trula Slade with vascular surgery 12/2019. At that time, his L popliteal artery stent was patent although he had 50-74% stenosis of the left mid-superficial femoral artery and it was recommended he return in 6 months and is due for repeat imaging.   - Will refer to vascular surgery for follow up with Dr. Trula Slade  - Continue Plavix 75mg  daily  - Add cilostazol 50mg  twice daily; consider increasing to 100mg  BID if symptoms improve

## 2020-08-30 NOTE — Assessment & Plan Note (Signed)
Well controlled currently.  BP Readings from Last 3 Encounters:  08/30/20 121/63  08/12/20 (!) 168/72  07/01/20 (!) 161/83   - Continue lisinopril 40mg  daily

## 2020-09-07 NOTE — Progress Notes (Signed)
Internal Medicine Clinic Attending  Case discussed with Dr. Speakman  At the time of the visit.  We reviewed the resident's history and exam and pertinent patient test results.  I agree with the assessment, diagnosis, and plan of care documented in the resident's note.  

## 2020-09-13 ENCOUNTER — Ambulatory Visit (INDEPENDENT_AMBULATORY_CARE_PROVIDER_SITE_OTHER): Payer: Medicare (Managed Care) | Admitting: Student

## 2020-09-13 ENCOUNTER — Other Ambulatory Visit: Payer: Self-pay

## 2020-09-13 DIAGNOSIS — Z794 Long term (current) use of insulin: Secondary | ICD-10-CM

## 2020-09-13 DIAGNOSIS — E1151 Type 2 diabetes mellitus with diabetic peripheral angiopathy without gangrene: Secondary | ICD-10-CM | POA: Diagnosis not present

## 2020-09-13 DIAGNOSIS — E1169 Type 2 diabetes mellitus with other specified complication: Secondary | ICD-10-CM | POA: Diagnosis not present

## 2020-09-13 MED ORDER — TOUJEO MAX SOLOSTAR 300 UNIT/ML ~~LOC~~ SOPN
84.0000 [IU] | PEN_INJECTOR | Freq: Every morning | SUBCUTANEOUS | 2 refills | Status: DC
Start: 2020-09-13 — End: 2021-02-09

## 2020-09-13 NOTE — Assessment & Plan Note (Signed)
Assessment: Sugars have improved over the past few weeks.  States his fasting sugars have been in the 220s to 250s and his postprandial have been in the 250s as well.  Denies any episodes of hypoglycemia.  Was working in the yard and had a sugar as low as 150.  Is unable to feel when her sugars are high or low.  We will have him schedule appointment to see [name] once discharged and requested that he call Dr. Velvet Bathe office back to schedule an ophthalmology appointment.  Per patient he was unaware of the ophthalmology referral and refused to schedule appointment when they called.  We will also continue him on his current diabetic regimen and recheck in 2 months to see how he is doing.  Plan: -Continue glargine 84 units daily, Humalog 30 units 3 times daily WC, liraglutide 1.8 mg daily, pioglitazone 30 mg daily -Schedule plan to meet with [name] for diabetes education -Follow-up ophthalmology appointment

## 2020-09-13 NOTE — Patient Instructions (Addendum)
Thank you, Nathan [name] for allowing [place] to provide your care today. Today we discussed.  Diabetes Please schedule an appointment with [name] for continued diabetes management.  We will not adjust any medications today.  I have ordered the following labs for you:  Lab Orders  No laboratory test(s) ordered today     Tests ordered today: None  Referrals ordered today:   Referral Orders  No referral(s) requested today     I have ordered the following medication/changed the following medications:   Stop the following medications: There are no discontinued medications.   Start the following medications: No orders of the defined types were placed in this encounter.    Follow up: 2 months A1c recheck   Remember: If she feels that her sugars are persistently high or dropping too low, please call our clinic to be seen sooner  Should you have any questions or concerns please call the internal medicine clinic at [phone].     Sanjuana Letters, D.O. [facility]

## 2020-09-13 NOTE — Progress Notes (Signed)
CC: Hyperglycemia  HPI:  [name] is a 70 y.o. male with a past medical history stated below and presents today for Hyperglycemia. Please see problem based assessment and plan for additional details.  Past Medical History:  Diagnosis Date  . Asthma    per 2003 [facility] pulm records pfts   . Blood dyscrasia    HIV  . Clotting disorder ([place])   . Colon polyps    noted previous colonoscopy [facility]  . DDD (degenerative disc disease)    cervical spine  . Depression   . Diabetes mellitus    dx 2010  . Fever    unknwon origin  . GERD (gastroesophageal reflux disease)   . Head swelling 05/23/2017  . Hep C w/o coma, chronic ([place])   . History of syphilis    noted [facility] records  . HIV infection ([place])    undetectable viral load and CD4 ct 667 as of 11/2011  . Hyperlipidemia   . Hypertension   . MRSA (methicillin resistant Staphylococcus aureus)   . Multiple sclerosis ([place])    in remission as of 11/2011 (diagnosed late 1980s)  . Pain in limb-Right Leg 10/13/2013  . 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[place])    2002  . Pneumonia   . Prosthesis fitting 03/10/2019  . PVD (peripheral vascular disease) ([place])    Left Stent 07/02/2008  . Scalp lesion 07/26/2017  . UTI (urinary tract infection)   . Wears dentures     Current Outpatient Medications on File Prior to Visit  Medication Sig Dispense Refill  . atorvastatin (LIPITOR) 40 MG tablet TAKE 1 TABLET(40 MG) BY MOUTH DAILY 90 tablet 2  . bictegravir-emtricitabine-tenofovir AF (BIKTARVY) 50-200-25 MG TABS tablet Take 1 tablet by mouth daily. 30 tablet 5  . Blood Glucose Monitoring Suppl (BAYER CONTOUR MONITOR) W/DEVICE KIT Use to check blood sugar as instructed up to 4 times a day 1 kit 0  . cilostazol (PLETAL) 50 MG tablet Take 1 tablet (50 mg total) by mouth 2 (two) times daily. 60 tablet 3  . clopidogrel (PLAVIX) 75 MG tablet Take 1 tablet (75 mg total) by mouth daily. 90 tablet 3  . ezetimibe (ZETIA) 10 MG tablet Take 1 tablet  (10 mg total) by mouth daily. 90 tablet 2  . glucose blood (CONTOUR NEXT TEST) test strip 1 each by Other route 5 (five) times daily. Use 5 times daily to check blood sugar. 375 each 3  . ibuprofen (ADVIL) 400 MG tablet Take by mouth.    . insulin lispro (HUMALOG KWIKPEN) 200 UNIT/ML KwikPen Inject 30 Units into the skin with breakfast, with lunch, and with evening meal. 72 mL 1  . Insulin Pen Needle (BD PEN NEEDLE NANO U/F) 32G X 4 MM MISC USE AS DIRECTED FOUR TIMES DAILY. Dx code  E11.42 200 each 5  . Lancet Devices (BAYER MICROLET 2 LANCING DEVIC) MISC Use to check blood sugar up to 3 ties a day 1 each 2  . liraglutide (VICTOZA) 18 MG/3ML SOPN ADMINISTER 1.8 MG UNDER THE SKIN EVERY MORNING 15 mL 6  . lisinopril (ZESTRIL) 40 MG tablet TAKE 1 TABLET BY MOUTH DAILY. HOLD IF SYSTOLIC BLOOD PRESSURE IS LESS THAN 110 90 tablet 3  . omeprazole (PRILOSEC) 40 MG capsule TAKE 1 CAPSULE(40 MG) BY MOUTH DAILY 90 capsule 1  . pioglitazone (ACTOS) 30 MG tablet Take 1 tablet (30 mg total) by mouth daily. 90 tablet 3   No current facility-administered medications on file prior to visit.  Family History  Problem Relation Age of Onset  . Diabetes Mother   . Coronary artery disease Mother   . Cancer Brother        colon caner stage 4 as of 11/2011 (unknown age of onset)  . Coronary artery disease Father   . Prostate cancer Brother   . Colon cancer Brother   . Rectal cancer Neg Hx     Social History   Socioeconomic History  . Marital status: Single    Spouse name: Not on file  . Number of children: Not on file  . Years of education: 101  . Highest education level: Not on file  Occupational History  . Not on file  Tobacco Use  . Smoking status: Former Smoker    Quit date: 05/09/2007    Years since quitting: 13.3  . Smokeless tobacco: Former Systems developer    Types: Secondary school teacher  . Vaping Use: Never used  Substance and Sexual Activity  . Alcohol use: No    Alcohol/week: 0.0 standard drinks  .  Drug use: No  . Sexual activity: Yes    Partners: Male    Comment: declined condoms  Other Topics Concern  . Not on file  Social History Narrative  . Not on file   Social Determinants of Health   Financial Resource Strain: Not on file  Food Insecurity: Not on file  Transportation Needs: Not on file  Physical Activity: Not on file  Stress: Not on file  Social Connections: Not on file  Intimate Partner Violence: Not on file    Review of Systems: ROS negative except for what is noted on the assessment and plan.  There were no vitals filed for this visit.   Physical Exam: Constitutional: well-appearing, sitting comfortably, in no acute distress HENT: normocephalic atraumatic Neck: supple Cardiovascular: regular rate Pulmonary/Chest: normal work of breathing on room air Neurological: alert & oriented x 3 Skin: warm and dry Psych: Normal mood and thought process   Assessment & Plan:   See Encounters Tab for problem based charting.  Patient discussed with Dr. Newell Coral, D.O. [facility], PGY-1 Pager: [phone], Phone: [phone] Date 09/13/2020 Time 10:49 AM

## 2020-09-21 ENCOUNTER — Ambulatory Visit (HOSPITAL_COMMUNITY): Payer: Medicare (Managed Care)

## 2020-09-23 ENCOUNTER — Telehealth: Payer: Self-pay

## 2020-09-23 NOTE — Telephone Encounter (Signed)
RX was just sent on 08/30/20 w/ 6 refills.   Pharmacy called to verify they do have this RX/refills, RN unable to hold for pharmacist. TC to patient, LM to call back regarding if he is having problems getting refills. SChaplin, RN,BSN

## 2020-09-23 NOTE — Telephone Encounter (Signed)
Need refill on liraglutide (VICTOZA) 18 MG/3ML SOPN ;pt contact [place], [place] [facility] [place]

## 2020-09-23 NOTE — Telephone Encounter (Signed)
Victoza 15 mL with 6 refills sent 08/30/2020. Left detailed message on patient's self-identified VM with this information and to call pharmacy for refill.

## 2020-09-27 ENCOUNTER — Ambulatory Visit (HOSPITAL_COMMUNITY)
Admission: RE | Admit: 2020-09-27 | Discharge: 2020-09-27 | Disposition: A | Discharge status: Home / Self Care | Payer: Medicare (Managed Care) | Source: Ambulatory Visit | Attending: Surgery | Admitting: Surgery

## 2020-09-27 ENCOUNTER — Other Ambulatory Visit: Payer: Self-pay

## 2020-09-27 ENCOUNTER — Ambulatory Visit (INDEPENDENT_AMBULATORY_CARE_PROVIDER_SITE_OTHER)
Admission: RE | Admit: 2020-09-27 | Discharge: 2020-09-27 | Disposition: A | Discharge status: Home / Self Care | Payer: Medicare (Managed Care) | Source: Ambulatory Visit | Attending: Surgery | Admitting: Surgery

## 2020-09-27 ENCOUNTER — Ambulatory Visit (INDEPENDENT_AMBULATORY_CARE_PROVIDER_SITE_OTHER): Payer: Medicare (Managed Care) | Admitting: Physician Assistant

## 2020-09-27 VITALS — BP 130/72 | HR 83 | Temp 98.3°F | Resp 20 | Ht 73.0 in | Wt 176.2 lb

## 2020-09-27 DIAGNOSIS — I6523 Occlusion and stenosis of bilateral carotid arteries: Secondary | ICD-10-CM

## 2020-09-27 DIAGNOSIS — I739 Peripheral vascular disease, unspecified: Secondary | ICD-10-CM | POA: Insufficient documentation

## 2020-09-27 DIAGNOSIS — I70212 Atherosclerosis of native arteries of extremities with intermittent claudication, left leg: Secondary | ICD-10-CM

## 2020-09-27 DIAGNOSIS — Z794 Long term (current) use of insulin: Secondary | ICD-10-CM

## 2020-09-27 DIAGNOSIS — I70213 Atherosclerosis of native arteries of extremities with intermittent claudication, bilateral legs: Secondary | ICD-10-CM | POA: Diagnosis not present

## 2020-09-27 DIAGNOSIS — E1169 Type 2 diabetes mellitus with other specified complication: Secondary | ICD-10-CM

## 2020-09-27 MED ORDER — VICTOZA 18 MG/3ML ~~LOC~~ SOPN: PEN_INJECTOR | SUBCUTANEOUS | 6 refills | Status: DC

## 2020-09-27 NOTE — Telephone Encounter (Signed)
Per patient requesting another doctor to write the Rx for  liraglutide ([place]) 18 MG/3ML SOPN. Please call pt back.

## 2020-09-27 NOTE — Telephone Encounter (Signed)
RTC, patient states he is still having trouble gettting RX's, he went to pick up his Victoza this weekend and was told he needed a new RX.  Yellow team, Pt should have refills available, I have tried calling this pharmacy on 2 occasions and am placed on hold, no one picks up.    Will you please send in a new RX?  Pt gets 3 pens/22ml at a time with refills.   Thank you, Lanetra Hartley

## 2020-09-27 NOTE — Progress Notes (Signed)
HISTORY AND PHYSICAL     CC:  follow up. Requesting Provider:  Jeralyn Bennett, MD  HPI: This is a 70 y.o. male who is here today for follow up and is pt of Dr. Trula Slade.   He initially presented with severe right leg pain in 2008. He underwent a right femoral to below-knee popliteal artery bypass graft with saphenous vein on 02/26/2007. This occluded and on 11/12/2007 he had a redo bypass with Gore-Tex. He ultimately ended up with a right above-knee amputation. In 2010 he began having left leg claudication and underwent popliteal artery stenting. This was repeated on 08/17/2017.  Pt was last seen on 12/08/2019.  At that time, was having some mild leg cramping but thought it may be attributed to the heat.  He did not have wounds and was working on his DM management. He has hx of mild carotid artery stenosis.   The pt returns today for follow up.  He states that he has pain in his left leg.  He sometimes gets cramping with walking but not always.  He does have some pain at night and this gets better with either leg elevation or lowering his leg to the ground.  He says sometimes it just feels better to move it to a different position.  He does not have any non healing wounds.  He does have an area on the side of his foot above the heel laterally that has healed.  He had an ulcer on the great toe that has healed.  He has numbness in his foot and up to mid calf.  He states that it has been this way for many years but recently has gotten worse.  He states that he has a hx of MS that is remission that could've caused this.  He states that he does have DM and his hgbA1c was over 8 at last check.  He states he has a callus on his right BKA stump.    He denies any amaurosis fugax, speech difficulties, weakness, numbness, paralysis of any extremitiy or facial droop.  The pt is on a statin for cholesterol management.    The pt is not on an aspirin.    Other AC:  Plavix, Pletal The pt is on ACEI for  hypertension.  The pt does have diabetes. Tobacco hx:  former  Pt does have family hx of AAA.    Past Medical History:  Diagnosis Date  . Asthma    per 2003 [facility] pulm records pfts   . Blood dyscrasia    HIV  . Clotting disorder ([place])   . Colon polyps    noted previous colonoscopy [facility]  . DDD (degenerative disc disease)    cervical spine  . Depression   . Diabetes mellitus    dx 2010  . Fever    unknwon origin  . GERD (gastroesophageal reflux disease)   . Head swelling 05/23/2017  . Hep C w/o coma, chronic ([place])   . History of syphilis    noted [facility] records  . HIV infection ([place])    undetectable viral load and CD4 ct 667 as of 11/2011  . Hyperlipidemia   . Hypertension   . MRSA (methicillin resistant Staphylococcus aureus)   . Multiple sclerosis ([place])    in remission as of 11/2011 (diagnosed late 1980s)  . Pain in limb-Right Leg 10/13/2013  . 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 Pneumonia   . Prosthesis fitting 03/10/2019  . PVD (peripheral  vascular disease) ([place])    Left Stent 07/02/2008  . Scalp lesion 07/26/2017  . UTI (urinary tract infection)   . Wears dentures     Past Surgical History:  Procedure Laterality Date  . ABDOMINAL AORTOGRAM W/LOWER EXTREMITY N/A 08/07/2017   Procedure: ABDOMINAL AORTOGRAM W/LOWER EXTREMITY;  Surgeon: Serafina Mitchell, MD;  Location: [place] [facility];  Service: Cardiovascular;  Laterality: N/A;  . ABOVE KNEE LEG AMPUTATION     Right Leg  . COLONOSCOPY W/ BIOPSIES AND POLYPECTOMY    . LOWER EXTREMITY ANGIOGRAM Left 12/26/2011   Procedure: LOWER EXTREMITY ANGIOGRAM;  Surgeon: Serafina Mitchell, MD;  Location: [facility] CATH LAB;  Service: Cardiovascular;  Laterality: Left;  . LOWER EXTREMITY ANGIOGRAM Left 08/17/2017   Procedure: LOWER EXTREMITY ANGIOGRAM LEFT LEG WITH RUNOFF AND Stenting.;  Surgeon: Serafina Mitchell, MD;  Location: [place];  Service: Vascular;  Laterality: Left;  Marland Kitchen MULTIPLE TOOTH EXTRACTIONS    . OTHER SURGICAL  HISTORY     right lower ext AKA with prothesis   . OTHER SURGICAL HISTORY     left left with stents (Dr. Seward Speck)  . OTHER SURGICAL HISTORY     2003 colonoscopy 5 mm polyp transverse colon; (2)2m  polyps in rectum-hyperplastic  . PERIPHERAL VASCULAR INTERVENTION Left 08/17/2017   Procedure: POPLITEAL STENT;  Surgeon: BSerafina Mitchell MD;  [facility]  Service: Vascular;  Laterality: Left;    Allergies  Allergen Reactions  . Sulfonamide Derivatives Hives    Current Outpatient Medications  Medication Sig Dispense Refill  . atorvastatin (LIPITOR) 40 MG tablet TAKE 1 TABLET(40 MG) BY MOUTH DAILY 90 tablet 2  . bictegravir-emtricitabine-tenofovir AF (BIKTARVY) 50-200-25 MG TABS tablet Take 1 tablet by mouth daily. 30 tablet 5  . Blood Glucose Monitoring Suppl (BAYER CONTOUR MONITOR) W/DEVICE KIT Use to check blood sugar as instructed up to 4 times a day 1 kit 0  . cilostazol (PLETAL) 50 MG tablet Take 1 tablet (50 mg total) by mouth 2 (two) times daily. 60 tablet 3  . clopidogrel (PLAVIX) 75 MG tablet Take 1 tablet (75 mg total) by mouth daily. 90 tablet 3  . ezetimibe (ZETIA) 10 MG tablet Take 1 tablet (10 mg total) by mouth daily. 90 tablet 2  . glucose blood (CONTOUR NEXT TEST) test strip 1 each by Other route 5 (five) times daily. Use 5 times daily to check blood sugar. 375 each 3  . ibuprofen (ADVIL) 400 MG tablet Take by mouth.    . insulin glargine, 2 Unit Dial, (TOUJEO MAX SOLOSTAR) 300 UNIT/ML Solostar Pen Inject 84 Units into the skin in the morning. 18 mL 2  . insulin lispro (HUMALOG KWIKPEN) 200 UNIT/ML KwikPen Inject 30 Units into the skin with breakfast, with lunch, and with evening meal. 72 mL 1  . Insulin Pen Needle (BD PEN NEEDLE NANO U/F) 32G X 4 MM MISC USE AS DIRECTED FOUR TIMES DAILY. Dx code  E11.42 200 each 5  . Lancet Devices (BAYER MICROLET 2 LANCING DEVIC) MISC Use to check blood sugar up to 3 ties a day 1 each 2  . liraglutide (VICTOZA) 18 MG/3ML SOPN  ADMINISTER 1.8 MG UNDER THE SKIN EVERY MORNING 15 mL 6  . lisinopril (ZESTRIL) 40 MG tablet TAKE 1 TABLET BY MOUTH DAILY. HOLD IF SYSTOLIC BLOOD PRESSURE IS LESS THAN 110 90 tablet 3  . omeprazole (PRILOSEC) 40 MG capsule TAKE 1 CAPSULE(40 MG) BY MOUTH DAILY 90 capsule 1  . pioglitazone (ACTOS) 30 MG  tablet Take 1 tablet (30 mg total) by mouth daily. 90 tablet 3   No current facility-administered medications for this visit.    Family History  Problem Relation Age of Onset  . Diabetes Mother   . Coronary artery disease Mother   . Cancer Brother        colon caner stage 4 as of 11/2011 (unknown age of onset)  . Coronary artery disease Father   . Prostate cancer Brother   . Colon cancer Brother   . Rectal cancer Neg Hx     Social History   Socioeconomic History  . Marital status: Single    Spouse name: Not on file  . Number of children: Not on file  . Years of education: 61  . Highest education level: Not on file  Occupational History  . Not on file  Tobacco Use  . Smoking status: Former Smoker    Quit date: 05/09/2007    Years since quitting: 13.3  . Smokeless tobacco: Former Systems developer    Types: Secondary school teacher  . Vaping Use: Never used  Substance and Sexual Activity  . Alcohol use: No    Alcohol/week: 0.0 standard drinks  . Drug use: No  . Sexual activity: Yes    Partners: Male    Comment: declined condoms  Other Topics Concern  . Not on file  Social History Narrative  . Not on file   Social Determinants of Health   Financial Resource Strain: Not on file  Food Insecurity: Not on file  Transportation Needs: Not on file  Physical Activity: Not on file  Stress: Not on file  Social Connections: Not on file  Intimate Partner Violence: Not on file     REVIEW OF SYSTEMS:   '[X]'  denotes positive finding, '[ ]'  denotes negative finding Cardiac  Comments:  Chest pain or chest pressure:    Shortness of breath upon exertion:    Short of breath when lying flat:     Irregular heart rhythm:        Vascular    Pain in calf, thigh, or hip brought on by ambulation: x   Pain in feet at night that wakes you up from your sleep:  x   Blood clot in your veins:    Leg swelling:         Pulmonary    Oxygen at home:    Wheezing:         Neurologic    Sudden weakness in arms or legs:     Sudden numbness in arms or legs:     Sudden onset of difficulty speaking or understanding others    Temporary loss of vision in one eye:     Problems with dizziness:         Gastrointestinal    Blood in stool:     Vomited blood:         Genitourinary    Burning when urinating:     Blood in urine:        Psychiatric    Major depression:         Hematologic    Bleeding problems:    Problems with blood clotting too easily:        Skin    Rashes or ulcers:        Constitutional    Fever or chills:      PHYSICAL EXAMINATION:  Today's Vitals   09/27/20 1354  BP: 130/72  Pulse: 83  Resp: 20  Temp: 98.3 F (36.8 C)  TempSrc: Temporal  SpO2: 93%  Weight: 176 lb 3.2 oz (79.9 kg)  Height: '6\' 1"'  (1.854 m)  PainSc: 3    Body mass index is 23.25 kg/m.   General:  WDWN in NAD; vital signs documented above Gait: Not observed HENT: WNL, normocephalic Pulmonary: normal non-labored breathing  Cardiac: regular HR;  with carotid bruit on the right Abdomen: soft, NT, aortic pulse is not palpable Skin: without rashes Vascular Exam/Pulses:  Right Left  Radial 2+ (normal) 2+ (normal)  Femoral Unable to palpate due to body habitus 2+ (normal)  Popliteal AKA Unable to palpate  DP AKA 1+ (weak)  PT AKA Unable to palpate   Extremities: without ischemic changes, without Gangrene , without cellulitis; without open wounds;  Musculoskeletal: no muscle wasting or atrophy  Neurologic: A&O X 3;  speech is fluent/normal; moving all extremities equally  Psychiatric:  The pt has Normal affect.   Non-Invasive Vascular Imaging:   ABI's/TBI's on 09/27/2020: Right:   AKA  Left:  0.74/0.23 - great toe pressure:  37   Arterial duplex on 09/27/2020: +----------+--------+-----+---------------+---------+--------+  LEFT   PSV cm/sRatioStenosis    Waveform Comments  +----------+--------+-----+---------------+---------+--------+  CFA Distal216              triphasic      +----------+--------+-----+---------------+---------+--------+  DFA    218              triphasic      +----------+--------+-----+---------------+---------+--------+  SFA Prox 188              triphasic      +----------+--------+-----+---------------+---------+--------+  SFA Mid  271      50-74% stenosisbiphasic       +----------+--------+-----+---------------+---------+--------+  SFA Distal95                         +----------+--------+-----+---------------+---------+--------+   A focal velocity elevation of 271 cm/s was obtained at with a VR of 2.4.    Left Stent(s):  +---------------+---++--------++  Prox to Stent 103biphasic  +---------------+---++--------++  Proximal Stent 130biphasic  +---------------+---++--------++  Mid Stent   148biphasic  +---------------+---++--------++  Distal Stent  102biphasic  +---------------+---++--------++  Distal to Stent133biphasic  +---------------+---++--------++   Summary:  Left: 50-74% stenosis noted in the mid/distal superficial femoral artery.  Several segments of large calcified plaques involving the superficial  femoral artery obscuring visualization.  Widely patent popliteal stent.   Non-Invasive Vascular Imaging:   Carotid Duplex on 09/27/2020: Right:  40-59% ICA stenosis Left:  40-59% ICA stenosis  Previous ABI's/TBI's on 12/08/2019: Right:  AKA Left:  0.71/0.52 - great toe pressure:  69  Previous LLE arterial duplex  12/08/2019: +----------+--------+-----+---------------+----------+--------+  LEFT   PSV cm/sRatioStenosis    Waveform Comments  +----------+--------+-----+---------------+----------+--------+  CFA Distal127              biphasic       +----------+--------+-----+---------------+----------+--------+  DFA    49              monophasic      +----------+--------+-----+---------------+----------+--------+  SFA Prox 70              biphasic       +----------+--------+-----+---------------+----------+--------+  SFA Mid  315      50-74% stenosisbiphasic       +----------+--------+-----+---------------+----------+--------+  SFA Distal93              monophasic      +----------+--------+-----+---------------+----------+--------+   A focal velocity elevation of  315 cm/s was obtained at with post stenotic  turbulence with a VR of 3.3. Findings are characteristic of 50-74%  stenosis.    Left Stent(s):  +---------------+---++----------++  Prox to Stent 93 biphasic   +---------------+---++----------++  Proximal Stent 105biphasic   +---------------+---++----------++  Mid Stent   106biphasic   +---------------+---++----------++  Distal Stent  123biphasic   +---------------+---++----------++  Distal to Stent43moophasic  +---------------+---++----------++   Summary:  Left: Patent stent with no evidence of stenosis in the popliteal artery.   Velocities in the mid superficial femoral artery are suggestive of a  50-74% stenosis.  Previous Carotid duplex on 12/08/2019: Right: 40-59% ICA stenosis Left:   40-59% ICA stenosis Bilateral vertebral arteries demonstrate antegrade flow.  Subclavians: Normal flow hemodynamics were seen in bilateral subclavian arteries.    ASSESSMENT/PLAN:: 70y.o. male here for follow up for PAD and  bilateral carotid artery stenosis   PAD -pt with hx of left popliteal artery stent.  This is widely patent on duplex today.  His ABI is essentially unchanged but there has been a decrease in his TBI.   -pt does have pain in his left foot at night, however, it improves with either elevating the leg higher or lowering it to the ground.  Most likely is not related to a blood flow issue since elevating it helps.  He does have neuropathy of his left foot and is probably multifactorial.  His hgbA1c is greater than 8.  Continue working on better control.  Discussed with him to inspect his foot daily and moisturize to keep from cracking.   -he knows to call [place] he develops any wounds or rest pain -he states he has a callus on his right AKA stump.  Discussed with him that he may want to go by [employer] and see if his prosthesis needs an adjustment.   -continue graduated walking program -pt will f/u in 6 months with ABI and LLE duplex.   Carotid stenosis -duplex today reveals his carotid stenosis is stable with 40-59% bilaterally.  He remains asymptomatic.  Discussed the different categories of stenosis and he knows that if it reaches 80%, then we will discuss options of treatment or if he becomes symptomatic.  He expressed understanding.   -discussed s/s of stroke/TIA and if pt develops any sx, they know to call 911 or go to the emergency room -pt will f/u in one year with carotid duplex  -continue statin/plavix    SLeontine Locket PCornerstone [facility] [phone] Clinic MD:   BTrula Slade

## 2020-09-29 ENCOUNTER — Telehealth: Payer: Self-pay | Admitting: *Deleted

## 2020-09-29 NOTE — Telephone Encounter (Signed)
Call to [facility] for PA for Victoza 18mg /36ml.  Information was given.. PA approved 08/30/2020 through 09/29/2021.  Case # [id number].  Sander Nephew, RN 09/29/2020 9:31 AM.

## 2020-09-30 ENCOUNTER — Encounter: Payer: Self-pay | Admitting: *Deleted

## 2020-10-05 ENCOUNTER — Ambulatory Visit: Payer: Medicare (Managed Care) | Admitting: Behavioral Health

## 2020-10-05 ENCOUNTER — Encounter: Payer: Self-pay | Admitting: Dietician

## 2020-10-05 ENCOUNTER — Ambulatory Visit: Payer: Medicare (Managed Care) | Admitting: Dietician

## 2020-10-05 DIAGNOSIS — Z794 Long term (current) use of insulin: Secondary | ICD-10-CM

## 2020-10-05 DIAGNOSIS — F331 Major depressive disorder, recurrent, moderate: Secondary | ICD-10-CM

## 2020-10-05 DIAGNOSIS — E1151 Type 2 diabetes mellitus with diabetic peripheral angiopathy without gangrene: Secondary | ICD-10-CM

## 2020-10-05 DIAGNOSIS — F419 Anxiety disorder, unspecified: Secondary | ICD-10-CM

## 2020-10-05 DIAGNOSIS — R4589 Other symptoms and signs involving emotional state: Secondary | ICD-10-CM

## 2020-10-05 LAB — HM DIABETES EYE EXAM

## 2020-10-05 NOTE — Addendum Note (Signed)
Addended by: Riesa Pope on: 10/05/2020 02:06 PM   Modules accepted: Orders

## 2020-10-05 NOTE — Progress Notes (Signed)
Diabetes Self Management Education & Support Nathan [name] presented today for a retinal exam and for a diabetes annual follow up visit as well.  Retinal images were done today as part of this patient's yearly diabetes health maintenance program. They were transmitted electronically to an ophthalmologist who will interpret them and send a report back with their findings. This was explained to the patient and they were also informed that the results would be communicated to them either by phone,  mail or both.   He also reported high diabetes distress with a score of 11/12 on the DDS-2 screening tool and a score of 4.6 on Emotional burden of diabetes and 4.8 on Regimen related distress with anything >3 or higher as being positive and suggesting additional attention to those items.   He said he was "tired" and thought about canceling today's appointment because he is tired of "fighting":  He denies being suicidal but has thought it might be easier to not be here. His stump is painful from a callus/wound, he is tired of fighting with the pharmacy and our office to get his medicine refilled. He finally got his Humalog and was so angry at it that he threw it away. He stopped all his medicines for a few days until his blood sugar went up to "700". He has since restarted the diabetes medicine he has. He states his blood sugar was in the 200s today and he has been drinking plenty of water. He did bring his meter although we tended to his emotional needs today and did not download it. He agreed to restart the 20 units rapid acting insulin before meals. (samples provided today) and to a referral to [facility] health. He was also scheduled for a doctor appointment for Thursday to assist with his stump wound. He is agreeable to a weekly GLP-1 to assist with decreasing his regimen related distress. He can follow up with me as needed.  Request referral to [facility].  [name], RD 10/05/2020 1:45 PM.

## 2020-10-05 NOTE — BH Specialist Note (Signed)
Integrated Behavioral Health via Telemedicine Visit  10/05/2020 Nathan [name] [id number]  Number of Nathan [name] visits: 1/6 Session Start time: 2:20pm  Session End time: 3:00pm Total time: 40   Referring Provider: Debera [name], RD Patient/Family location: Pt is home in private [facility] Provider location: [facility] All persons participating in visit: Pt & Clinician Types of Service: Individual psychotherapy and Health Promotion  I connected with Nathan [name] and/or Nathan [name]'s self via  Telephone or Video Enabled Telemedicine Application  (Video is Caregility application) and verified that I am speaking with the correct person using two identifiers. Discussed confidentiality: Yes   I discussed the limitations of telemedicine and the availability of in person appointments.  Discussed there is a possibility of technology failure and discussed alternative modes of communication if that failure occurs.  I discussed that engaging in this telemedicine visit, they consent to the provision of behavioral healthcare and the services will be billed under their insurance.  Patient and/or legal guardian expressed understanding and consented to Telemedicine visit: Yes   Presenting Concerns: Patient and/or family reports the following symptoms/concerns: Pt seen today @ the request of Nathan [name], RD. Pt in [facility] today for visit w/Physician. Pt is psychologically overwhelmed today. Duration of problem: wks; Severity of problem: moderate to severe  Patient and/or Family's Strengths/Protective Factors: Social connections, Social and Emotional competence, Concrete supports in place (healthy food, safe environments, etc.), Sense of purpose and Physical Health (exercise, healthy diet, medication compliance, etc.)  Goals Addressed: Patient will: 1.  Reduce symptoms of: anxiety and depression  2.  Increase knowledge and/or ability of: coping skills and stress reduction  3.   Demonstrate ability to: Increase healthy adjustment to current life circumstances and Begin healthy grieving over loss that is focused on his health status changes over the lifespan  Progress towards Goals: Estb'd today; Pt will use psychotherapy sessions for encouragement & support  Interventions: Interventions utilized:  Supportive Counseling and inc'd sense of self-confidence & efficacy, dealing w/pain mgmt Standardized Assessments completed: screeners prn  Patient and/or Family Response: Pt receptive to call & requests future appt to support the processing of his health status issues & his need for objective support  Assessment: Patient currently experiencing inc in anx/dep due to the pain his prosthetic is causing him. Pt has an appt to re-fit the prosthetic w/the Co that made it-[employer].   Patient may benefit from processing the difficulties he has exp'd in recent yrs & his family's distance from him Px'ly.  Plan: 1. Follow up with behavioral health clinician on : 2-3 wks for 60 min f:f 2. Behavioral recommendations: try to get out of the home more freq'ly, reach out to others when you are in need, & trust ppl will be honest about what they can & cannot do. Stop treating yourself w/a double standard. 3. Referral(s): [place] (In Clinic)  I discussed the assessment and treatment plan with the patient and/or parent/guardian. They were provided an opportunity to ask questions and all were answered. They agreed with the plan and demonstrated an understanding of the instructions.   They were advised to call back or seek an in-person evaluation if the symptoms worsen or if the condition fails to improve as anticipated.  Nathan Hutching, LMFT

## 2020-10-05 NOTE — Patient Instructions (Addendum)
Call or go by [facility] and see if his prosthesis needs an adjustment.    [address], [place], [place] [address]  Closes 5PM Phone: [phone]  I will ask Dr. Theodis Shove to call you.  [name] [phone]

## 2020-10-06 NOTE — Progress Notes (Signed)
Internal Medicine Clinic Attending  Case discussed with Dr. Katsadouros  At the time of the visit.  We reviewed the resident's history and exam and pertinent patient test results.  I agree with the assessment, diagnosis, and plan of care documented in the resident's note.  

## 2020-10-07 ENCOUNTER — Ambulatory Visit (INDEPENDENT_AMBULATORY_CARE_PROVIDER_SITE_OTHER): Payer: Medicare (Managed Care) | Admitting: Internal Medicine

## 2020-10-07 ENCOUNTER — Other Ambulatory Visit: Payer: Self-pay

## 2020-10-07 ENCOUNTER — Encounter: Payer: Self-pay | Admitting: Internal Medicine

## 2020-10-07 DIAGNOSIS — Z89611 Acquired absence of right leg above knee: Secondary | ICD-10-CM

## 2020-10-07 NOTE — Assessment & Plan Note (Signed)
#  Stump injury: He states the for the past couple months, he has noticed pain and skin breakdown at his right stump where he had the AKA about 13 years ago.  States that initially he noticed a callus at the site which developed into skin breakdown.  Initially, he had some bleeding which has stopped.  He denies fevers, chills or any systemic symptoms.  He states that he has reached out to his prosthetic company who wanted him to be evaluated first before making any adjustments to his stump and sleep.  He states that his sleep might be too tight. He had a similar event 8 years ago and improved with Band-Aid and dressing.   Assessment and plan: No signs or evidence of cellulitis.  His skin injury is likely secondary to irritation from his prosthesis and will need adjustment.

## 2020-10-07 NOTE — Addendum Note (Signed)
Addended by: Jean Rosenthal on: 10/07/2020 09:25 AM   Modules accepted: Orders

## 2020-10-07 NOTE — Addendum Note (Signed)
Addended by: Marcelino Duster on: 10/07/2020 04:29 PM   Modules accepted: Orders

## 2020-10-07 NOTE — Progress Notes (Signed)
   CC: Right stump skin breakdown   HPI:  [name] is a 70 y.o. with medical history significant for insulin-dependent diabetes mellitus, status post right AKA hypertension here with stump injury.  Please see problem based charting for further details.  Past Medical History:  Diagnosis Date  . Asthma    per 2003 [facility] pulm records pfts   . Blood dyscrasia    HIV  . Clotting disorder ([place])   . Colon polyps    noted previous colonoscopy [facility]  . DDD (degenerative disc disease)    cervical spine  . Depression   . Diabetes mellitus    dx 2010  . Fever    unknwon origin  . GERD (gastroesophageal reflux disease)   . Head swelling 05/23/2017  . Hep C w/o coma, chronic ([place])   . History of syphilis    noted [facility] records  . HIV infection ([place])    undetectable viral load and CD4 ct 667 as of 11/2011  . Hyperlipidemia   . Hypertension   . MRSA (methicillin resistant Staphylococcus aureus)   . Multiple sclerosis ([place])    in remission as of 11/2011 (diagnosed late 1980s)  . Pain in limb-Right Leg 10/13/2013  . 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[place])    2002  . Pneumonia   . Prosthesis fitting 03/10/2019  . PVD (peripheral vascular disease) ([place])    Left Stent 07/02/2008  . Scalp lesion 07/26/2017  . UTI (urinary tract infection)   . Wears dentures    Review of Systems:  As per HPI  Physical Exam:  Vitals:   10/07/20 0846  BP: (!) 164/79  Pulse: 70  Temp: 97.7 F (36.5 C)  TempSrc: Oral  SpO2: 96%  Weight: 179 lb 4.8 oz (81.3 kg)  Height: 6\' 1"  (1.854 m)   Physical Exam Vitals and nursing note reviewed.  HENT:     Head: Normocephalic and atraumatic.  Musculoskeletal:     Comments: S/p Right AKA. 3 cm linear skin lesion/wound at the stump without erythema, warmness or pain     Right Lower Extremity: Right leg is amputated above ankle.  Neurological:     Mental Status: He is alert.     Assessment & Plan:   See Encounters Tab for problem based  charting.  Patient discussed with Dr. Evette Doffing

## 2020-10-07 NOTE — Patient Instructions (Signed)
[name],   Thanks for seeing [place] today. I do not see any signs of infection at the skin breakdown site. The breakdown is due to irritation from the prosthetic. You will have to follow up with [facility] for further adjustment.   Take care!  Dr. Eileen Stanford  Please call the internal medicine center clinic if you have any questions or concerns, we may be able to help and keep you from a long and expensive emergency room wait. Our clinic and after hours phone number is [phone], the best time to call is Monday through Friday 9 am to 4 pm but there is always someone available 24/7 if you have an emergency. If you need medication refills please notify your pharmacy one week in advance and they will send [place] a request.   If you have not gotten the COVID vaccine, I recommend doing so:  You may get it at your local [facility] or [facility] OR To schedule an appointment for a COVID vaccine or be added to the vaccine wait list: Go to [email or web address]   OR Go to [email or web address]                  OR Call [phone]                                     OR Call [phone] and select Option 2

## 2020-10-07 NOTE — Addendum Note (Signed)
Addended by: Jean Rosenthal on: 10/07/2020 09:28 AM   Modules accepted: Orders

## 2020-10-07 NOTE — Addendum Note (Signed)
Addended by: Resa Miner on: 10/07/2020 01:35 PM   Modules accepted: Orders

## 2020-10-08 NOTE — Progress Notes (Signed)
Internal Medicine Clinic Attending  Case discussed with Dr. Agyei  At the time of the visit.  We reviewed the resident's history and exam and pertinent patient test results.  I agree with the assessment, diagnosis, and plan of care documented in the resident's note.  

## 2020-10-11 ENCOUNTER — Encounter (INDEPENDENT_AMBULATORY_CARE_PROVIDER_SITE_OTHER): Payer: Medicare (Managed Care) | Admitting: Dietician

## 2020-10-11 DIAGNOSIS — Z794 Long term (current) use of insulin: Secondary | ICD-10-CM | POA: Diagnosis not present

## 2020-10-11 DIAGNOSIS — E1151 Type 2 diabetes mellitus with diabetic peripheral angiopathy without gangrene: Secondary | ICD-10-CM

## 2020-10-19 ENCOUNTER — Telehealth: Payer: Self-pay

## 2020-10-19 ENCOUNTER — Other Ambulatory Visit: Payer: Self-pay | Admitting: Student

## 2020-10-19 DIAGNOSIS — E1169 Type 2 diabetes mellitus with other specified complication: Secondary | ICD-10-CM

## 2020-10-19 MED ORDER — HUMALOG KWIKPEN 200 UNIT/ML ~~LOC~~ SOPN: 30.0000 [IU] | PEN_INJECTOR | Freq: Three times a day (TID) | SUBCUTANEOUS | 1 refills | Status: DC

## 2020-10-19 NOTE — Telephone Encounter (Signed)
Return call to [facility] - needs a refill on Humalog. Rx sent 08/30/20 but as "Print". Thanks

## 2020-10-19 NOTE — Telephone Encounter (Signed)
[facility] tech is requesting a call back .. she stated that pt told them the Dr had made changes to his insulin but no new RX was sent over

## 2020-10-26 ENCOUNTER — Ambulatory Visit: Payer: Medicare (Managed Care) | Admitting: Behavioral Health

## 2020-10-26 ENCOUNTER — Other Ambulatory Visit: Payer: Self-pay | Admitting: Dietician

## 2020-10-26 DIAGNOSIS — Z794 Long term (current) use of insulin: Secondary | ICD-10-CM

## 2020-10-26 DIAGNOSIS — F330 Major depressive disorder, recurrent, mild: Secondary | ICD-10-CM

## 2020-10-26 DIAGNOSIS — E1169 Type 2 diabetes mellitus with other specified complication: Secondary | ICD-10-CM

## 2020-10-26 MED ORDER — HUMALOG KWIKPEN 200 UNIT/ML ~~LOC~~ SOPN: 30.0000 [IU] | PEN_INJECTOR | Freq: Three times a day (TID) | SUBCUTANEOUS | 3 refills | Status: DC

## 2020-10-26 NOTE — Telephone Encounter (Signed)
I was informed by Dr. Theodis Shove that [name] is still having problems getting enough Humalog u-200 Kwikpens.   Call to [facility] who dispensed amount on 6/11- 36 mL- can get more after 6/29- giving him partial amounts because they are limited by what they can order. He needs 40.69mL (8100 units)  for 90 days (one pen of 300 units lasts 6.6 days)   [facility] suggested additional refills on this prescription.  Call to Nathan [name] and left him a message to explain why he is getting partial fills and that he can get refills sooner than 90 days. Also to call us if he is running short and cannot get a refill as we may have samples to tide him over.

## 2020-10-26 NOTE — BH Specialist Note (Signed)
Integrated Behavioral Health Follow Up In-Person Visit  MRN: [id number] Name: Nathan [name]  Number of [place] Clinician visits: 2/6 Session Start time: 10:am  Session End time: 10:50am Total time: 50  minutes  Types of Service: Individual psychotherapy  Interpretor:No. Interpretor Name and Language: n/a   Subjective: Nathan [name] is a 70 y.o. male accompanied by  self Patient was referred by Nathan [name], RD for adjustment issues to health status changes & overall mental health wellness assessment. Patient reports the following symptoms/concerns: Pt has a medical Hx detailed in his [facility] Chart. Pt adjustment to managing his DM has been difficult. Duration of problem: yrs w/health issues that are chronic; Severity of problem: moderate  Objective: Mood: Euthymic and Affect: Appropriate & congruent w/mood Risk of harm to self or others: No plan to harm self or others  Life Context: Family and Social: Pt is one of 15 Siblings; 4 have died & the other 58 are located in [place]. Pt last saw his Family in 22-May-2020 when his Str & SIL died in MVA. Pt was married in 1972 & relationship lasted 14 yrs prior to divorce. He has one Dtr who is 72yo. He does not see her often as she is, "her StepFather's Dtr!". Pt disclosed he is a gay man who chooses no Partner @ present. He has good friendship network that is sufficient for support.   Pt has had many health episodes w/PCP pneumo & the last one resulted in his hospitalization & then transfer to [facility] for a mos or more. There he had to relearn everything. While in [place] ~ 2 yrs ago, Pt's Bros died of COVID-27. This was hard as he has lost Siblings from colon cancer, liver cancer & Alzheimer's Dis.  School/Work: Pt is not in Sch & is not presently employed. Pt has a R leg AKA since 2009. Self-Care: Pt is good to himself & realistic about his health needs. Pt declines need for psychotherapy @ this time. Life  Changes: Pt has dealt w/chronic health issues for yrs & feels his adjustment has been positive. He describes the incident 2 yrs ago where he was found in his home on the floor after 5 days-this was problematic & Pt cares for self better now.  Patient and/or Family's Strengths/Protective Factors: Social connections, Social and Emotional competence, Concrete supports in place (healthy food, safe environments, etc.), and Sense of purpose - Pt enjoys helping others & has been highly invld in [place] in the past. The Owner of the Non-Profit has changed hands this year & Pt is disappointed in the difference in Mgmt.   Goals Addressed: Patient will:  Reduce symptoms of: anxiety, depression, and stress   Increase knowledge and/or ability of: coping skills, healthy habits, and stress reduction   Demonstrate ability to: Increase healthy adjustment to current life circumstances, Increase adequate support systems for patient/family, and Volunteering or work in the helping profession would optimize Pt engagement in life.  Progress towards Goals: Ongoing - Pt aware of Clinician availability prn  Interventions: Interventions utilized:  Supportive Counseling and Preventative Services/Health Promotion Standardized Assessments completed:  screeners prn  Patient and/or Family Response: Pt receptive to visit today needing help w/negotiation of 3 insulin injectables for mgmt of his DM.   Patient Centered Plan: Patient is on the following Treatment Plan(s): Pt is managing life & feels he is doing fine. Pt has good network of supportive friends who are present for him.  Assessment: Patient currently  experiencing solid adjustment to his health status changes & mgmt of them to his satisfaction unless things become frustrating.   Patient may benefit from knowledge of available [place] @ [facility] for future.  Plan: Follow up with behavioral health clinician on : None @ this time Behavioral  recommendations: None @ this time Referral(s):  None @ this time "From scale of 1-10, how likely are you to follow plan?": None @ this time  Donnetta Hutching, LMFT

## 2020-11-03 ENCOUNTER — Other Ambulatory Visit: Payer: Self-pay | Admitting: Family

## 2020-11-03 DIAGNOSIS — B20 Human immunodeficiency virus [HIV] disease: Secondary | ICD-10-CM

## 2020-12-01 ENCOUNTER — Other Ambulatory Visit: Payer: Self-pay | Admitting: *Deleted

## 2020-12-01 DIAGNOSIS — I10 Essential (primary) hypertension: Secondary | ICD-10-CM

## 2020-12-01 MED ORDER — LISINOPRIL 40 MG PO TABS: ORAL_TABLET | ORAL | 3 refills | Status: DC

## 2020-12-24 DIAGNOSIS — Z89611 Acquired absence of right leg above knee: Secondary | ICD-10-CM | POA: Diagnosis not present

## 2020-12-30 ENCOUNTER — Other Ambulatory Visit: Payer: Self-pay | Admitting: *Deleted

## 2020-12-30 DIAGNOSIS — I739 Peripheral vascular disease, unspecified: Secondary | ICD-10-CM

## 2020-12-30 MED ORDER — CILOSTAZOL 50 MG PO TABS: 50.0000 mg | ORAL_TABLET | Freq: Two times a day (BID) | ORAL | 3 refills | Status: DC

## 2021-01-14 ENCOUNTER — Other Ambulatory Visit: Payer: Self-pay

## 2021-01-14 ENCOUNTER — Ambulatory Visit (INDEPENDENT_AMBULATORY_CARE_PROVIDER_SITE_OTHER): Payer: Medicare (Managed Care)

## 2021-01-14 ENCOUNTER — Ambulatory Visit: Payer: Medicare (Managed Care)

## 2021-01-14 DIAGNOSIS — Z23 Encounter for immunization: Secondary | ICD-10-CM

## 2021-01-14 NOTE — Progress Notes (Signed)
    [facility]  Name:  Nathan [name]    MRN: [id number] DOB: [birth year]   01/14/2021  [name] was observed post JYNNEOS immunization for 15 minutes without incident. He was provided with Vaccine Information Sheet and instruction to access the V-Safe system.   [name] was instructed to call 911 with any severe reactions post vaccine: Difficulty breathing  Swelling of face and throat  A fast heartbeat  A bad rash all over body  Dizziness and weakness

## 2021-01-21 ENCOUNTER — Encounter: Payer: Self-pay | Admitting: Infectious Disease

## 2021-01-31 ENCOUNTER — Other Ambulatory Visit: Payer: Self-pay

## 2021-01-31 DIAGNOSIS — E1169 Type 2 diabetes mellitus with other specified complication: Secondary | ICD-10-CM

## 2021-01-31 MED ORDER — EZETIMIBE 10 MG PO TABS: 10.0000 mg | ORAL_TABLET | Freq: Every day | ORAL | 2 refills | Status: DC

## 2021-02-01 ENCOUNTER — Other Ambulatory Visit: Payer: Self-pay

## 2021-02-01 DIAGNOSIS — E7800 Pure hypercholesterolemia, unspecified: Secondary | ICD-10-CM

## 2021-02-01 DIAGNOSIS — E1169 Type 2 diabetes mellitus with other specified complication: Secondary | ICD-10-CM

## 2021-02-01 MED ORDER — ATORVASTATIN CALCIUM 40 MG PO TABS: ORAL_TABLET | ORAL | 2 refills | Status: DC

## 2021-02-09 ENCOUNTER — Ambulatory Visit (INDEPENDENT_AMBULATORY_CARE_PROVIDER_SITE_OTHER): Payer: Medicare (Managed Care) | Admitting: Student

## 2021-02-09 ENCOUNTER — Telehealth: Payer: Self-pay | Admitting: Dietician

## 2021-02-09 ENCOUNTER — Encounter: Payer: Self-pay | Admitting: Student

## 2021-02-09 ENCOUNTER — Other Ambulatory Visit: Payer: Self-pay

## 2021-02-09 VITALS — BP 131/58 | HR 66 | Temp 97.5°F | Ht 73.0 in | Wt 183.6 lb

## 2021-02-09 DIAGNOSIS — E1151 Type 2 diabetes mellitus with diabetic peripheral angiopathy without gangrene: Secondary | ICD-10-CM | POA: Diagnosis not present

## 2021-02-09 DIAGNOSIS — M79605 Pain in left leg: Secondary | ICD-10-CM

## 2021-02-09 DIAGNOSIS — Z794 Long term (current) use of insulin: Secondary | ICD-10-CM | POA: Diagnosis not present

## 2021-02-09 DIAGNOSIS — Z23 Encounter for immunization: Secondary | ICD-10-CM

## 2021-02-09 DIAGNOSIS — E1169 Type 2 diabetes mellitus with other specified complication: Secondary | ICD-10-CM | POA: Diagnosis not present

## 2021-02-09 DIAGNOSIS — G8929 Other chronic pain: Secondary | ICD-10-CM | POA: Diagnosis not present

## 2021-02-09 LAB — POCT GLYCOSYLATED HEMOGLOBIN (HGB A1C): Hemoglobin A1C: 10.5 % — AB (ref 4.0–5.6)

## 2021-02-09 LAB — GLUCOSE, CAPILLARY: Glucose-Capillary: 473 mg/dL — ABNORMAL HIGH (ref 70–99)

## 2021-02-09 MED ORDER — TOUJEO MAX SOLOSTAR 300 UNIT/ML ~~LOC~~ SOPN: 90.0000 [IU] | PEN_INJECTOR | Freq: Every morning | SUBCUTANEOUS | 2 refills | Status: DC

## 2021-02-09 MED ORDER — DICLOFENAC SODIUM 1 % EX GEL: 4.0000 g | Freq: Four times a day (QID) | CUTANEOUS | Status: DC | PRN

## 2021-02-09 NOTE — Telephone Encounter (Signed)
Call to patient to follow up on diabetes, diabetes distress, referral to behavioral health. Left message for return call.

## 2021-02-09 NOTE — Assessment & Plan Note (Signed)
Influenza vaccine administered today.

## 2021-02-09 NOTE — Patient Instructions (Signed)
Nathan [name],  It was a pleasure seeing you in the clinic today.   We are checking your A1c today, I will call you with the results. I have referred you to an endocrinologist to better manage your diabetes medications. Someone will call you to schedule this appointment. Please come back in 3 months for a repeat A1c.  Please call our clinic at [phone] if you have any questions or concerns. The best time to call is Monday-Friday from 9am-4pm, but there is someone available 24/7 at the same number. If you need medication refills, please notify your pharmacy one week in advance and they will send [place] a request.   Thank you for letting us take part in your care. We look forward to seeing you next time!

## 2021-02-09 NOTE — Progress Notes (Signed)
CC: f/u of chronic medical conditions  HPI:  Nathan [name] is a 70 y.o. male with history listed below presenting to the [facility] for f/u of chronic medical conditions. Please see individualized problem based charting for full HPI.  Past Medical History:  Diagnosis Date   Asthma    per 2003 [facility] pulm records pfts    Blood dyscrasia    HIV   Clotting disorder ([place])    Colon polyps    noted previous colonoscopy [facility]   DDD (degenerative disc disease)    cervical spine   Depression    Diabetes mellitus    dx 2010   Fever    unknwon origin   GERD (gastroesophageal reflux disease)    Head swelling 05/23/2017   Hep C w/o coma, chronic ([facility])    History of syphilis    noted [facility] records   HIV infection ([place])    undetectable viral load and CD4 ct 667 as of 11/2011   Hyperlipidemia    Hypertension    MRSA (methicillin resistant Staphylococcus aureus)    Multiple sclerosis ([place])    in remission as of 11/2011 (diagnosed late 1980s)   Pain in limb-Right Leg 10/13/2013   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[place])    2002   Pneumonia    Prosthesis fitting 03/10/2019   PVD (peripheral vascular disease) ([place])    Left Stent 07/02/2008   Scalp lesion 07/26/2017   UTI (urinary tract infection)    Wears dentures     Review of Systems:  Negative aside from that listed in individualized problem based charting.  Physical Exam:  Vitals:   02/09/21 1100  BP: (!) 131/58  Pulse: 66  Temp: (!) 97.5 F (36.4 C)  TempSrc: Oral  SpO2: 95%  Weight: 183 lb 9.6 oz (83.3 kg)  Height: 6\' 1"  (1.854 m)   Physical Exam Constitutional:      Appearance: He is obese. He is not ill-appearing.  HENT:     Mouth/Throat:     Mouth: Mucous membranes are moist.     Pharynx: Oropharynx is clear. No oropharyngeal exudate.  Eyes:     Extraocular Movements: Extraocular movements intact.     Conjunctiva/sclera: Conjunctivae normal.     Pupils: Pupils are equal, round, and reactive to light.   Cardiovascular:     Rate and Rhythm: Normal rate and regular rhythm.     Pulses: Normal pulses.     Heart sounds: Normal heart sounds. No murmur heard.   No friction rub. No gallop.  Pulmonary:     Effort: Pulmonary effort is normal.     Breath sounds: Normal breath sounds. No wheezing, rhonchi or rales.  Abdominal:     General: Bowel sounds are normal. There is no distension.     Palpations: Abdomen is soft.     Tenderness: There is no abdominal tenderness.  Musculoskeletal:        General: No swelling. Normal range of motion.     Cervical back: Normal range of motion.     Comments: Prosthesis on RLE.  Skin:    General: Skin is warm and dry.  Neurological:     General: No focal deficit present.     Mental Status: He is alert and oriented to person, place, and time.  Psychiatric:        Mood and Affect: Mood normal.        Behavior: Behavior normal.     Assessment & Plan:   See Encounters Tab  for problem based charting.  Patient discussed with Dr. Dareen Piano

## 2021-02-09 NOTE — Assessment & Plan Note (Signed)
Patient with history of uncontrolled T2DM, on glargine 84 units daily, humalog 30 units TID with meals, victoza 1.8 units daily, and pioglitazone 30mg  daily. Last A1c 5 months ago was 8.9%.   Reviewed glucometer readings and his glucose levels remain elevated and above goal about 97% of the time. POC A1c has increased to 10.5%. He does report strict compliance to his diabetic medications, but does endorse dietary indiscretion.  Given that his glucose levels are remaining elevated despite large doses of insulin daily, I believe it is appropriate to refer this patient to endocrinology for further management of his diabetes. He agrees with this. Until evaluation by endocrinology can be performed, I will increase his glargine to 90 units daily and have him see [name] for optimization of medications.  Plan: -increase glargine to 90 units daily -continue humalog 30 units TID with meals, pioglitazone 30mg  daily, and victoza 1.8 units daily -referral to endocrinology -referral to diabetes and nutrition services -f/u in 3 months for repeat A1c

## 2021-02-10 ENCOUNTER — Other Ambulatory Visit: Payer: Self-pay

## 2021-02-10 ENCOUNTER — Other Ambulatory Visit: Payer: Medicare (Managed Care)

## 2021-02-10 ENCOUNTER — Telehealth: Payer: Self-pay | Admitting: Dietician

## 2021-02-10 DIAGNOSIS — R768 Other specified abnormal immunological findings in serum: Secondary | ICD-10-CM | POA: Diagnosis not present

## 2021-02-10 DIAGNOSIS — Z794 Long term (current) use of insulin: Secondary | ICD-10-CM | POA: Diagnosis not present

## 2021-02-10 DIAGNOSIS — B20 Human immunodeficiency virus [HIV] disease: Secondary | ICD-10-CM

## 2021-02-10 DIAGNOSIS — E1169 Type 2 diabetes mellitus with other specified complication: Secondary | ICD-10-CM | POA: Diagnosis not present

## 2021-02-10 DIAGNOSIS — I739 Peripheral vascular disease, unspecified: Secondary | ICD-10-CM | POA: Diagnosis not present

## 2021-02-10 MED ORDER — DICLOFENAC SODIUM 1 % EX GEL: 4.0000 g | Freq: Four times a day (QID) | CUTANEOUS | 1 refills | Status: DC | PRN

## 2021-02-10 NOTE — Progress Notes (Signed)
Internal Medicine Clinic Attending  Case discussed with Dr. Jinwala  At the time of the visit.  We reviewed the resident's history and exam and pertinent patient test results.  I agree with the assessment, diagnosis, and plan of care documented in the resident's note.  

## 2021-02-10 NOTE — Telephone Encounter (Signed)
Spoke with Dr. Allyson Sabal. He will send Rx for voltaren gel to [facility] now.

## 2021-02-10 NOTE — Telephone Encounter (Signed)
Nathan [name] returned my call leaving a message that he would try me back this afternoon.

## 2021-02-10 NOTE — Telephone Encounter (Signed)
Talked to [facility] and he says he has been to [facility] twice for the arthritis medication that Dr. Allyson Sabal said he sent. They do not have it.  He would like it sent as soon as possible. ( Te is voltarin listed as a clinic administered medication- could it be this one was entered incorrectly? )  Thank you for sending it to [facility] As soon as possible.   He lost his meter and all his strips yesterday as well. He asks [place] to look for it. Will leave a new one with strips up front for him to pick up.

## 2021-02-10 NOTE — Addendum Note (Signed)
Addended by: Virl Axe on: 02/10/2021 04:30 PM   Modules accepted: Orders

## 2021-02-11 LAB — T-HELPER CELL (CD4) - (RCID CLINIC ONLY)
CD4 % Helper T Cell: 19 % — ABNORMAL LOW (ref 33–65)
CD4 T Cell Abs: 488 /uL (ref 400–1790)

## 2021-02-14 ENCOUNTER — Other Ambulatory Visit: Payer: Self-pay

## 2021-02-14 DIAGNOSIS — I739 Peripheral vascular disease, unspecified: Secondary | ICD-10-CM

## 2021-02-14 LAB — CBC WITH DIFFERENTIAL/PLATELET
Absolute Monocytes: 490 cells/uL (ref 200–950)
Basophils Absolute: 39 cells/uL (ref 0–200)
Basophils Relative: 0.7 %
Eosinophils Absolute: 160 cells/uL (ref 15–500)
Eosinophils Relative: 2.9 %
HCT: 44.6 % (ref 38.5–50.0)
Hemoglobin: 15.1 g/dL (ref 13.2–17.1)
Lymphs Abs: 2547 cells/uL (ref 850–3900)
MCH: 31.6 pg (ref 27.0–33.0)
MCHC: 33.9 g/dL (ref 32.0–36.0)
MCV: 93.3 fL (ref 80.0–100.0)
MPV: 9.3 fL (ref 7.5–12.5)
Monocytes Relative: 8.9 %
Neutro Abs: 2266 cells/uL (ref 1500–7800)
Neutrophils Relative %: 41.2 %
Platelets: 176 10*3/uL (ref 140–400)
RBC: 4.78 10*6/uL (ref 4.20–5.80)
RDW: 13 % (ref 11.0–15.0)
Total Lymphocyte: 46.3 %
WBC: 5.5 10*3/uL (ref 3.8–10.8)

## 2021-02-14 LAB — COMPLETE METABOLIC PANEL WITH GFR
AG Ratio: 1.7 (calc) (ref 1.0–2.5)
ALT: 21 U/L (ref 9–46)
AST: 25 U/L (ref 10–35)
Albumin: 4.3 g/dL (ref 3.6–5.1)
Alkaline phosphatase (APISO): 63 U/L (ref 35–144)
BUN: 15 mg/dL (ref 7–25)
CO2: 25 mmol/L (ref 20–32)
Calcium: 9.5 mg/dL (ref 8.6–10.3)
Chloride: 99 mmol/L (ref 98–110)
Creat: 1.02 mg/dL (ref 0.70–1.28)
Globulin: 2.6 g/dL (calc) (ref 1.9–3.7)
Glucose, Bld: 369 mg/dL — ABNORMAL HIGH (ref 65–99)
Potassium: 4.5 mmol/L (ref 3.5–5.3)
Sodium: 135 mmol/L (ref 135–146)
Total Bilirubin: 0.6 mg/dL (ref 0.2–1.2)
Total Protein: 6.9 g/dL (ref 6.1–8.1)
eGFR: 78 mL/min/{1.73_m2} (ref >=60)

## 2021-02-14 LAB — RPR TITER: RPR Titer: 1:2 {titer} — ABNORMAL HIGH

## 2021-02-14 LAB — RPR: RPR Ser Ql: REACTIVE — AB

## 2021-02-14 LAB — FLUORESCENT TREPONEMAL AB(FTA)-IGG-BLD: Fluorescent Treponemal ABS: REACTIVE — AB

## 2021-02-14 LAB — HIV-1 RNA QUANT-NO REFLEX-BLD
HIV 1 RNA Quant: <20 Copies/mL — ABNORMAL HIGH
HIV-1 RNA Quant, Log: <1.3 Log cps/mL — ABNORMAL HIGH

## 2021-02-14 MED ORDER — CLOPIDOGREL BISULFATE 75 MG PO TABS: 75.0000 mg | ORAL_TABLET | Freq: Every day | ORAL | 3 refills | Status: DC

## 2021-02-14 NOTE — Telephone Encounter (Signed)
Spoke with Mr. Kiehn October 6. He agreed to appointment with diabetes educator for next week.

## 2021-02-15 ENCOUNTER — Other Ambulatory Visit: Payer: Self-pay

## 2021-02-15 DIAGNOSIS — E1169 Type 2 diabetes mellitus with other specified complication: Secondary | ICD-10-CM

## 2021-02-15 DIAGNOSIS — Z794 Long term (current) use of insulin: Secondary | ICD-10-CM

## 2021-02-15 MED ORDER — PIOGLITAZONE HCL 30 MG PO TABS: 30.0000 mg | ORAL_TABLET | Freq: Every day | ORAL | 3 refills | Status: DC

## 2021-02-16 ENCOUNTER — Ambulatory Visit: Payer: Medicare (Managed Care) | Admitting: Dietician

## 2021-02-16 ENCOUNTER — Encounter: Payer: Self-pay | Admitting: Dietician

## 2021-02-16 ENCOUNTER — Ambulatory Visit: Payer: Medicare (Managed Care) | Admitting: Behavioral Health

## 2021-02-16 ENCOUNTER — Other Ambulatory Visit: Payer: Self-pay | Admitting: Student

## 2021-02-16 MED ORDER — OZEMPIC (1 MG/DOSE) 4 MG/3ML ~~LOC~~ SOPN: 1.0000 mg | PEN_INJECTOR | SUBCUTANEOUS | 2 refills | Status: DC

## 2021-02-16 NOTE — Progress Notes (Signed)
Diabetes Self Management Education & Support Start time:1525 end time: 1625  Diabetes Distress not done formally today. However, Nathan [name] looking forward to meeting with endocrinologist, willing to makes changes to help lower his blood sugars and injection burden. He met with [facility] counselor today.  We discussed his ambivalence between eating what he wants and caring for his diabetes.  Lab Results  Component Value Date   HGBA1C 10.5 (A) 02/09/2021   HGBA1C 8.9 (A) 08/30/2020   HGBA1C 7.3 (A) 05/13/2020   HGBA1C 7.0 (A) 02/10/2020   HGBA1C 7.9 (A) 09/15/2019    His meter was not downloaded today as he brought his new meter that only had 3 readings on it. He found his other meter and restarted using it. He states checking his blood sugars does not bother him despite having marks on his fingertips from checking it so often. He states he thinks he blood sugars have improved some since his last visit.   Wt Readings from Last 10 Encounters:  02/16/21 186 lb 3.2 oz (84.5 kg)  02/09/21 183 lb 9.6 oz (83.3 kg)  10/07/20 179 lb 4.8 oz (81.3 kg)  09/27/20 176 lb 3.2 oz (79.9 kg)  08/30/20 180 lb 9.6 oz (81.9 kg)  08/12/20 180 lb 3.2 oz (81.7 kg)  07/01/20 178 lb (80.7 kg)  05/13/20 179 lb (81.2 kg)  02/10/20 180 lb 4.8 oz (81.8 kg)  12/15/19 184 lb (83.5 kg)   Estimated body mass index is 24.57 kg/m as calculated from the following:   Height as of 02/09/21: _0  (1.854 m).   Weight as of this encounter: 186 lb 3.2 oz (84.5 kg).  His diabetes medicines were reviewed today. He currently takes 5 injections a day /35 a week: Victoza max dose of 1.8 units 1x/day, Humalog 30 units 3x/day and Toujeo 90 units daily.  He reports increased hunger and having trouble with injections not going into skin, pen injection button being hard to push and knots under his skin. His abdomen was examined today and he has significant hypertrophy and scar tissue on both sides of his naval and possible other  areas as well. He has minimal exogenous adipose tissue to use for injections. His other injections sites were not examined today as he says he cannot use them, his buttocks or arms for injections.   He is willing to switch to a weekly GLP-1 and try an injection device for mealtime insulin called CeQur Simplicity patch that holds 200 units to assist with decreasing injection burden. This was discussed with Dr. Allyson Sabal today.   Plan- see patient instructions, follow up in 3 weeks. [name], RD 02/16/2021 5:32 PM.

## 2021-02-16 NOTE — Patient Instructions (Addendum)
When you get the Ozempic pen.  Do not take the Victoza after that and start 1 mg dose the next day. Write down the day you a started it inside the box, then take Ozempic 1 mg weekly.  To help decrease scar tissue at injection sites:  1- please always pinch up when injection 2- do not injection in areas with scar tissue we circled around your belly button 3- be sure to use a different injection site each time you inject( rotate sites like th picture I gave you)   We'll work on sending a prescription for [employer] ( device to use for meal time injections) to your pharmacy . You can pick it up and bring it to be trained on how to use it.   [name] [phone]

## 2021-02-17 ENCOUNTER — Telehealth: Payer: Self-pay | Admitting: Dietician

## 2021-02-17 NOTE — Telephone Encounter (Signed)
Called patient to update him on the CeQur Simplicity insulin patch for mealtime insulin. I waiting to hear if he can use it with Humalog u200 insulin vs convert to u100 if he wants to use it. To help decrease his injection burden.     He said using the pinch up technique helped his insulin to go into him better. He plans to start the [place] when his pharmacy has it ready and stop the Victoza.

## 2021-02-22 ENCOUNTER — Other Ambulatory Visit: Payer: Self-pay

## 2021-02-22 DIAGNOSIS — K219 Gastro-esophageal reflux disease without esophagitis: Secondary | ICD-10-CM

## 2021-02-22 MED ORDER — OMEPRAZOLE 40 MG PO CPDR: DELAYED_RELEASE_CAPSULE | ORAL | 1 refills | Status: DC

## 2021-02-24 ENCOUNTER — Ambulatory Visit (INDEPENDENT_AMBULATORY_CARE_PROVIDER_SITE_OTHER): Payer: Medicare (Managed Care) | Admitting: Infectious Disease

## 2021-02-24 ENCOUNTER — Ambulatory Visit (INDEPENDENT_AMBULATORY_CARE_PROVIDER_SITE_OTHER): Payer: Medicare (Managed Care)

## 2021-02-24 ENCOUNTER — Other Ambulatory Visit: Payer: Self-pay

## 2021-02-24 ENCOUNTER — Other Ambulatory Visit: Payer: Medicare (Managed Care)

## 2021-02-24 ENCOUNTER — Encounter: Payer: Self-pay | Admitting: Infectious Disease

## 2021-02-24 VITALS — BP 161/76 | HR 72 | Temp 97.4°F | Wt 182.0 lb

## 2021-02-24 DIAGNOSIS — K219 Gastro-esophageal reflux disease without esophagitis: Secondary | ICD-10-CM | POA: Diagnosis not present

## 2021-02-24 DIAGNOSIS — E1151 Type 2 diabetes mellitus with diabetic peripheral angiopathy without gangrene: Secondary | ICD-10-CM | POA: Diagnosis not present

## 2021-02-24 DIAGNOSIS — I10 Essential (primary) hypertension: Secondary | ICD-10-CM | POA: Diagnosis not present

## 2021-02-24 DIAGNOSIS — B20 Human immunodeficiency virus [HIV] disease: Secondary | ICD-10-CM | POA: Diagnosis not present

## 2021-02-24 DIAGNOSIS — R197 Diarrhea, unspecified: Secondary | ICD-10-CM | POA: Insufficient documentation

## 2021-02-24 DIAGNOSIS — I70213 Atherosclerosis of native arteries of extremities with intermittent claudication, bilateral legs: Secondary | ICD-10-CM | POA: Diagnosis not present

## 2021-02-24 DIAGNOSIS — Z23 Encounter for immunization: Secondary | ICD-10-CM | POA: Diagnosis not present

## 2021-02-24 HISTORY — DX: Diarrhea, unspecified: R19.7

## 2021-02-24 MED ORDER — BIKTARVY 50-200-25 MG PO TABS: 1.0000 | ORAL_TABLET | Freq: Every day | ORAL | 11 refills | Status: DC

## 2021-02-24 NOTE — Addendum Note (Signed)
Addended by: Caffie Pinto on: 02/24/2021 04:08 PM   Modules accepted: Orders

## 2021-02-24 NOTE — Progress Notes (Signed)
Chief complaint: Heartburn worsening along with loose stools  Subjective:    Patient ID: Nathan [name], male    DOB: 05/09/1950, 70 y.o.   MRN: [id number]  HPI  Nathan [name] is a 70 y.o. male whose HIV has been perfectly suppressed on Biktarvy and BID AZT  We ran [employer] which is shown below:      Nathan [name] is remained perfectly suppressed on BIKTARVY.  He has noticed worsening dyspepsia with reflux over the last 3 to 4 days as well as for loose bowel movements in the morning and at night.  I was not aware of it this but apparently he was just recently switched from [place] to [place] notified me of this after the visit and suggested it was hospital he could be having adverse reaction to this new medication that could be causing his loose stools and dyspepsia.     Past Medical History:  Diagnosis Date   Asthma    per 2003 [facility] pulm records pfts    Blood dyscrasia    HIV   Clotting disorder ([place])    Colon polyps    noted previous colonoscopy [facility]   DDD (degenerative disc disease)    cervical spine   Depression    Diabetes mellitus    dx 2010   Diarrhea 02/24/2021   Fever    unknwon origin   GERD (gastroesophageal reflux disease)    Head swelling 05/23/2017   Hep C w/o coma, chronic (HCC)    History of syphilis    noted [facility] records   HIV infection ([place])    undetectable viral load and CD4 ct 667 as of 11/2011   Hyperlipidemia    Hypertension    MRSA (methicillin resistant Staphylococcus aureus)    Multiple sclerosis ([place])    in remission as of 11/2011 (diagnosed late 1980s)   Pain in limb-Right Leg 10/13/2013   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[place])    2002   Pneumonia    Prosthesis fitting 03/10/2019   PVD (peripheral vascular disease) ([place])    Left Stent 07/02/2008   Scalp lesion 07/26/2017   UTI (urinary tract infection)    Wears dentures     Past Surgical History:  Procedure Laterality Date   ABDOMINAL AORTOGRAM W/LOWER  EXTREMITY N/A 08/07/2017   Procedure: ABDOMINAL AORTOGRAM W/LOWER EXTREMITY;  Surgeon: Serafina Mitchell, MD;  Location: [place] CV LAB;  Service: Cardiovascular;  Laterality: N/A;   ABOVE KNEE LEG AMPUTATION     Right Leg   COLONOSCOPY W/ BIOPSIES AND POLYPECTOMY     LOWER EXTREMITY ANGIOGRAM Left 12/26/2011   Procedure: LOWER EXTREMITY ANGIOGRAM;  Surgeon: Serafina Mitchell, MD;  Location: [facility] CATH LAB;  Service: Cardiovascular;  Laterality: Left;   LOWER EXTREMITY ANGIOGRAM Left 08/17/2017   Procedure: LOWER EXTREMITY ANGIOGRAM LEFT LEG WITH RUNOFF AND Stenting.;  Surgeon: Serafina Mitchell, MD;  Location: [facility] OR;  Service: Vascular;  Laterality: Left;   MULTIPLE TOOTH EXTRACTIONS     OTHER SURGICAL HISTORY     right lower ext AKA with prothesis    OTHER SURGICAL HISTORY     left left with stents (Dr. Seward Speck)   OTHER SURGICAL HISTORY     2003 colonoscopy 5 mm polyp transverse colon; (2)65m  polyps in rectum-hyperplastic   PERIPHERAL VASCULAR INTERVENTION Left 08/17/2017   Procedure: POPLITEAL STENT;  Surgeon: BSerafina Mitchell MD;  Location: [facility]  Service: Vascular;  Laterality: Left;    Family History  Problem Relation Age of Onset   Diabetes Mother    Coronary artery disease Mother    Cancer Brother        colon caner stage 4 as of 11/2011 (unknown age of onset)   Coronary artery disease Father    Prostate cancer Brother    Colon cancer Brother    Rectal cancer Neg Hx       Social History   Socioeconomic History   Marital status: Single    Spouse name: Not on file   Number of children: Not on file   Years of education: 12   Highest education level: Not on file  Occupational History   Not on file  Tobacco Use   Smoking status: Former   Smokeless tobacco: Former    Types: Nurse, children's Use: Never used  Substance and Sexual Activity   Alcohol use: No    Alcohol/week: 0.0 standard drinks   Drug use: No   Sexual activity: Yes    Partners: Male     Comment: declined condoms  Other Topics Concern   Not on file  Social History Narrative   Not on file   Social Determinants of Health   Financial Resource Strain: Not on file  Food Insecurity: Not on file  Transportation Needs: Not on file  Physical Activity: Not on file  Stress: Not on file  Social Connections: Not on file    Allergies  Allergen Reactions   Sulfonamide Derivatives Hives     Current Outpatient Medications:    atorvastatin (LIPITOR) 40 MG tablet, TAKE 1 TABLET(40 MG) BY MOUTH DAILY, Disp: 90 tablet, Rfl: 2   Blood Glucose Monitoring Suppl (BAYER CONTOUR MONITOR) W/DEVICE KIT, Use to check blood sugar as instructed up to 4 times a day, Disp: 1 kit, Rfl: 0   clopidogrel (PLAVIX) 75 MG tablet, Take 1 tablet (75 mg total) by mouth daily., Disp: 90 tablet, Rfl: 3   diclofenac Sodium (VOLTAREN) 1 % GEL, Apply 4 g topically 4 (four) times daily as needed., Disp: 100 g, Rfl: 1   ezetimibe (ZETIA) 10 MG tablet, Take 1 tablet (10 mg total) by mouth daily., Disp: 90 tablet, Rfl: 2   glucose blood (CONTOUR NEXT TEST) test strip, 1 each by Other route 5 (five) times daily. Use 5 times daily to check blood sugar., Disp: 375 each, Rfl: 3   ibuprofen (ADVIL) 400 MG tablet, Take by mouth., Disp: , Rfl:    insulin glargine, 2 Unit Dial, (TOUJEO MAX SOLOSTAR) 300 UNIT/ML Solostar Pen, Inject 90 Units into the skin in the morning., Disp: 18 mL, Rfl: 2   insulin lispro (HUMALOG KWIKPEN) 200 UNIT/ML KwikPen, Inject 30 Units into the skin with breakfast, with lunch, and with evening meal., Disp: 72 mL, Rfl: 3   Insulin Pen Needle (BD PEN NEEDLE NANO U/F) 32G X 4 MM MISC, USE AS DIRECTED FOUR TIMES DAILY. Dx code  E11.42, Disp: 200 each, Rfl: 5   Lancet Devices (BAYER MICROLET 2 LANCING DEVIC) MISC, Use to check blood sugar up to 3 ties a day, Disp: 1 each, Rfl: 2   lisinopril (ZESTRIL) 40 MG tablet, TAKE 1 TABLET BY MOUTH DAILY. HOLD IF SYSTOLIC BLOOD PRESSURE IS LESS THAN 110, Disp: 90  tablet, Rfl: 3   omeprazole (PRILOSEC) 40 MG capsule, TAKE 1 CAPSULE(40 MG) BY MOUTH DAILY, Disp: 90 capsule, Rfl: 1   pioglitazone (ACTOS) 30 MG tablet, Take 1 tablet (30 mg total) by mouth daily., Disp:  90 tablet, Rfl: 3   Semaglutide, 1 MG/DOSE, (OZEMPIC, 1 MG/DOSE,) 4 MG/3ML SOPN, Inject 1 mg into the skin once a week., Disp: 9 mL, Rfl: 2   bictegravir-emtricitabine-tenofovir AF (BIKTARVY) 50-200-25 MG TABS tablet, Take 1 tablet by mouth daily., Disp: 30 tablet, Rfl: 11   cilostazol (PLETAL) 50 MG tablet, Take 1 tablet (50 mg total) by mouth 2 (two) times daily., Disp: 60 tablet, Rfl: 3  Review of Systems  Constitutional:  Negative for activity change, appetite change, chills, diaphoresis, fatigue, fever and unexpected weight change.  HENT:  Negative for congestion, rhinorrhea, sinus pressure, sneezing, sore throat and trouble swallowing.   Eyes:  Negative for photophobia and visual disturbance.  Respiratory:  Negative for cough, chest tightness, shortness of breath, wheezing and stridor.   Cardiovascular:  Negative for chest pain, palpitations and leg swelling.  Gastrointestinal:  Positive for diarrhea. Negative for abdominal distention, abdominal pain, anal bleeding, blood in stool, constipation, nausea and vomiting.  Genitourinary:  Negative for difficulty urinating, dysuria, flank pain and hematuria.  Musculoskeletal:  Negative for arthralgias, back pain, gait problem, joint swelling and myalgias.  Skin:  Negative for color change, pallor, rash and wound.  Neurological:  Negative for dizziness, tremors, weakness and light-headedness.  Hematological:  Negative for adenopathy. Does not bruise/bleed easily.  Psychiatric/Behavioral:  Negative for agitation, behavioral problems, confusion, decreased concentration, dysphoric mood and sleep disturbance.       Objective:   Physical Exam Constitutional:      Appearance: He is well-developed.  HENT:     Head: Normocephalic and atraumatic.   Eyes:     Conjunctiva/sclera: Conjunctivae normal.  Cardiovascular:     Rate and Rhythm: Normal rate and regular rhythm.  Pulmonary:     Effort: Pulmonary effort is normal. No respiratory distress.     Breath sounds: No wheezing.  Abdominal:     General: There is no distension.     Palpations: Abdomen is soft.     Tenderness: There is no abdominal tenderness.  Musculoskeletal:        General: No tenderness. Normal range of motion.     Cervical back: Normal range of motion and neck supple.  Skin:    General: Skin is warm and dry.     Coloration: Skin is not pale.     Findings: No erythema or rash.  Neurological:     General: No focal deficit present.     Mental Status: He is alert and oriented to person, place, and time.  Psychiatric:        Mood and Affect: Mood normal.        Behavior: Behavior normal.        Thought Content: Thought content normal.        Judgment: Judgment normal.       Assessment & Plan:    HIV disease:   I reviewed his viral load from February 10, 2021 which is less than 20 and CD4 count was 488  Lab Results  Component Value Date   HIV1RNAQUANT <20 (H) 02/10/2021   Lab Results  Component Value Date   CD4TABS 488 02/10/2021   CD4TABS 566 07/29/2020   CD4TABS 649 06/14/2020    I am continuing his prescription for BIKTARVY  Syphilis: He is now serofast  Syphilis: Serofast 1:2 labs reviewed from February 10, 2021  Diabetes mellitus A1c is up to 10 now as mentioned his regimen changed recently could be causing his symptoms of dyspepsia and diarrhea.  Nupathe dyspepsia  and diarrhea: I was unaware of the change his medications and offered to check lipase and amylase along with liver function test which I did and also I sent out a stool pathogen panel.  He does not have fevers or chills or other symptoms that would typically suggest an infectious diarrhea.  BP: worse in clinic today  HTN: followed by PCP . Vitals:   02/24/21 0932  BP: (!)  161/76  Pulse: 72  Temp: (!) 97.4 F (36.3 C)  SpO2: 96%     Vaccine counseling: provided flu and bivalent vaccine for COVID 19

## 2021-02-24 NOTE — Progress Notes (Signed)
   Covid-19 Vaccination Clinic  Name:  Nathan [name]    MRN: [id number] DOB: 10/20/1950  02/24/2021  [name] was observed post Covid-19 immunization for 15 minutes without incident. He was provided with Vaccine Information Sheet and instruction to access the V-Safe system.   [name] was instructed to call 911 with any severe reactions post vaccine: Difficulty breathing  Swelling of face and throat  A fast heartbeat  A bad rash all over body  Dizziness and weakness   Immunizations Administered     Name Date Dose VIS Date Route   Pfizer Covid-19 Vaccine Bivalent Booster 02/24/2021 10:05 AM 0.3 mL 01/05/2021 Intramuscular   Manufacturer: [place]   Lot: [place]: [place], RN

## 2021-02-25 ENCOUNTER — Ambulatory Visit: Payer: Medicare (Managed Care)

## 2021-02-25 LAB — COMPLETE METABOLIC PANEL WITH GFR
AG Ratio: 1.4 (calc) (ref 1.0–2.5)
ALT: 21 U/L (ref 9–46)
AST: 27 U/L (ref 10–35)
Albumin: 4.2 g/dL (ref 3.6–5.1)
Alkaline phosphatase (APISO): 61 U/L (ref 35–144)
BUN: 14 mg/dL (ref 7–25)
CO2: 26 mmol/L (ref 20–32)
Calcium: 9.9 mg/dL (ref 8.6–10.3)
Chloride: 105 mmol/L (ref 98–110)
Creat: 1.06 mg/dL (ref 0.70–1.28)
Globulin: 2.9 g/dL (calc) (ref 1.9–3.7)
Glucose, Bld: 136 mg/dL — ABNORMAL HIGH (ref 65–99)
Potassium: 3.8 mmol/L (ref 3.5–5.3)
Sodium: 140 mmol/L (ref 135–146)
Total Bilirubin: 0.7 mg/dL (ref 0.2–1.2)
Total Protein: 7.1 g/dL (ref 6.1–8.1)
eGFR: 75 mL/min/{1.73_m2} (ref >=60)

## 2021-02-25 LAB — LIPASE: Lipase: 26 U/L (ref 7–60)

## 2021-02-25 LAB — AMYLASE: Amylase: 58 U/L (ref 21–101)

## 2021-02-27 LAB — GI PATHOGEN PANEL BY PCR, STOOL
Adenovirus F40/41: NEGATIVE
Astrovirus: NEGATIVE
C.DIFFICILE TOXIN: NEGATIVE
CRYPTOSPORIDIUM SPECIES: NEGATIVE
Campylobacter species: NEGATIVE
Cyclospora cayetanensis: NEGATIVE
ENTEROAGGREGATIVE E. COLI (EAEC): NEGATIVE
ENTEROPATHOGENIC E. COLI (EPEC): NEGATIVE
ENTEROTOXIGENIC E. COLI (ETEC): NEGATIVE
Entamoeba histolytica: NEGATIVE
GIARDIA: NEGATIVE
Norovirus GI/GII: NEGATIVE
Plesiomonas shigelloides: NEGATIVE
ROTAVIRUS: NEGATIVE
SHIGA TOXIN PRODUCING E. COLI: NEGATIVE
SHIGELLA/ENTEROINVASIVE E. COLI: NEGATIVE
Salmonella species: NEGATIVE
Sapovirus: NEGATIVE
Vibrio cholerae: NEGATIVE
Vibrio species: NEGATIVE
YERSINIA SPECIES: NEGATIVE

## 2021-03-01 ENCOUNTER — Other Ambulatory Visit: Payer: Self-pay | Admitting: Dietician

## 2021-03-01 ENCOUNTER — Telehealth: Payer: Self-pay

## 2021-03-01 DIAGNOSIS — Z794 Long term (current) use of insulin: Secondary | ICD-10-CM

## 2021-03-01 NOTE — Telephone Encounter (Signed)
Patient aware of results. Patient concerned if he may need to stop taking new diabetes med. Informed patient PCP is managing diabetes med he will need to discuss med with them.   [place], CMA

## 2021-03-01 NOTE — Telephone Encounter (Signed)
-----   Message from Truman Hayward, MD sent at 02/28/2021  5:57 PM EDT ----- GI panel negative and his primary care team think his new diabetes med may be causing his symptoms ----- Message ----- From: [place] [facility] Lab Results In Sent: 02/27/2021   4:53 PM EDT To: Truman Hayward, MD

## 2021-03-03 ENCOUNTER — Telehealth: Payer: Self-pay

## 2021-03-03 MED ORDER — INSULIN LISPRO 100 UNIT/ML IJ SOLN: INTRAMUSCULAR | 11 refills | Status: DC

## 2021-03-03 MED ORDER — CEQUR SIMPLICITY INSERTER MISC: 1 refills | Status: DC

## 2021-03-03 MED ORDER — CEQUR SIMPLICITY 2U DEVI: 11 refills | Status: DC

## 2021-03-03 NOTE — Telephone Encounter (Signed)
Spoke with Nathan [name], his nausea, vomiting and diarrhea are much better. He went ahead and took his second dose of the semaglutide and had just a little diarrhea that is now better. His blood sugars have improved  so much that when his stomach was not feeling well he had one blood sugar of of 67-69, and has had 100 several times. He is pleased that his blood sugars are so much better. He guesses the average  is~  150 now. Today his blood sugars is 152 fasting.   He would like to try the [place] Simplicity insulin delivery device and acknowledged out follow up appointment for 03/09/21. Suggest using Humalog u100 at 15 units with meals 3x/day

## 2021-03-03 NOTE — Telephone Encounter (Signed)
PA  for pt ( cequr simplicity 2 u device ) came through on cover my meds was done and sent back with office notes from 10/5 .Marland Kitchen Awaiting approval or denial

## 2021-03-03 NOTE — Telephone Encounter (Signed)
Call to see if we can get prescription transferred. [facility] is transferring it. Patient informed to bring device, inserter and vial of Humalog.

## 2021-03-03 NOTE — Telephone Encounter (Signed)
DECISION :     Approvedtoday   CaseId:[id number];Status:Approved;Review Type:Prior Auth;Coverage Start Date:02/01/2021;Coverage End Date:03/03/2022;   Drug CeQur Simplicity 2U device   ( COPY SENT TO PHARMACY  ALSO )

## 2021-03-08 ENCOUNTER — Telehealth: Payer: Self-pay | Admitting: Dietician

## 2021-03-08 NOTE — Telephone Encounter (Signed)
Has covid, needs to reschedule his appointment.

## 2021-03-09 ENCOUNTER — Ambulatory Visit: Payer: Medicare (Managed Care) | Admitting: Dietician

## 2021-03-09 ENCOUNTER — Ambulatory Visit: Payer: Medicare (Managed Care) | Admitting: Behavioral Health

## 2021-03-16 ENCOUNTER — Ambulatory Visit (INDEPENDENT_AMBULATORY_CARE_PROVIDER_SITE_OTHER): Payer: Medicare (Managed Care) | Admitting: Dietician

## 2021-03-16 ENCOUNTER — Other Ambulatory Visit: Payer: Self-pay

## 2021-03-16 DIAGNOSIS — E1151 Type 2 diabetes mellitus with diabetic peripheral angiopathy without gangrene: Secondary | ICD-10-CM

## 2021-03-16 DIAGNOSIS — Z713 Dietary counseling and surveillance: Secondary | ICD-10-CM

## 2021-03-16 DIAGNOSIS — Z794 Long term (current) use of insulin: Secondary | ICD-10-CM

## 2021-03-16 NOTE — Patient Instructions (Signed)
Good job placing the Simplicity patch today!  Change the insulin patch on Saturday afternoon.   Call me if you need to at [phone].  Continue Toujeo at 60 units a day  Use 7-10 ckicks for each meal 3x daily ( 7 clicks = 14 units- 10 clicks = 20 units)   Check blood sugar before and 2 hours after meals at least for today and tomorrow..   Then you can call the office on Friday if needed.   You are doing great and are a fast learner!!!  Please make a follow up with me and a doctor in 2 weeks. Pleas bring our meter.   [name] [phone]

## 2021-03-17 ENCOUNTER — Encounter: Payer: Self-pay | Admitting: Dietician

## 2021-03-17 NOTE — Progress Notes (Signed)
Called patient on 03/17/21 to follow up: he states his fasting blood sugar today was 97 before breakfast today/117 after meal of cereal took 10 clicks, he is happy with his blood sugars. We discussed that if he is uncomfortable with  taking 10 clicks (20 units) he can adjust it to 7 clicks( 14 units) . He verbalized understanding.   Lab Results  Component Value Date   HGBA1C 10.5 (A) 02/09/2021   HGBA1C 8.9 (A) 08/30/2020   HGBA1C 7.3 (A) 05/13/2020   HGBA1C 7.0 (A) 02/10/2020   HGBA1C 7.9 (A) 09/15/2019     Diabetes Self-Management Education  Visit Type: Follow-up (annual follow up for [place] Simplicity education)  Appt. Start Time: 1500 Appt. End Time: 6629  03/17/2021  [name], identified by name and date of birth, is a 70 y.o. male with a diagnosis of Diabetes:  Marland Kitchen Type 2  ASSESSMENT  Discussed the following blood sugars with Dr. Evette Doffing who adjusted his insulin to Toujeo 60 units daily (patient has been taking this the past few days because his blood sugars are so much better on semaglutide and he was fearful of hypoglycemia) . He adjusted his Humalog to 15-20 units three times daily with meals. (Patient was either omitting meal time insulin or taking 25 units 1-2 times daily before our visit today) .   METER DOWNLOAD  Report summary is from last 30 days,  Average tests per day: 2.7 Average blood glucose: 176 Range: minimum: 69 and maximum: 403 Days without test: 0 % in target range: 63 % below target range: 1 % above target range: 36 % hypoglycemia: 1 Notes about patterns: shows very elevated blood sugars until ~10-18 when they dropped to mostly in target. Since 10-27 fasting 100-200 and 70-200 in evening    Diabetes Self-Management Education - 03/17/21 1000       Visit Information   Visit Type Follow-up   annual follow up for [place]   How would you rate your overall health? Good      Psychosocial Assessment    Patient Belief/Attitude about Diabetes Motivated to manage diabetes    Self-care barriers None    Self-management support Doctor's office;Support group;CDE visits    Patient Concerns Problem Solving    Special Needs None    Preferred Learning Style Hands on;No preference indicated    Learning Readiness Ready    How often do you need to have someone help you when you read instructions, pamphlets, or other written materials from your doctor or pharmacy? 2 - Rarely    What is the last grade level you completed in school? 12      Pre-Education Assessment   Patient understands the diabetes disease and treatment process. Demonstrates understanding / competency    Patient understands incorporating nutritional management into lifestyle. Demonstrates understanding / competency    Patient undertands incorporating physical activity into lifestyle. Demonstrates understanding / competency    Patient understands using medications safely. Needs Review    Patient understands monitoring blood glucose, interpreting and using results Needs Instruction    Patient understands prevention, detection, and treatment of acute complications. Demonstrates understanding / competency    Patient understands prevention, detection, and treatment of chronic complications. Demonstrates understanding / competency    Patient understands how to develop strategies to address psychosocial issues. Demonstrates understanding / competency    Patient understands how to develop strategies to promote health/change behavior. Needs Review      Complications  Last HgB A1C per patient/outside source 10.5 %    How often do you check your blood sugar? > 4 times/day    Fasting Blood glucose range (mg/dL) 70-129;130-179    Postprandial Blood glucose range (mg/dL) >200    Number of hypoglycemic episodes per month 1    Can you tell when your blood sugar is low? Yes    What do you do if your blood sugar is low? drinks juice or eats candy     Number of hyperglycemic episodes per week 5    Can you tell when your blood sugar is high? No    Have you had a dilated eye exam in the past 12 months? Yes    Have you had a dental exam in the past 12 months? Yes    Are you checking your feet? Yes    How many days per week are you checking your feet? 7      Dietary Intake   Breakfast cereal, milk    Beverage(s) water, juice      Exercise   Exercise Type ADL's;Light (walking / raking leaves)    How many days per week to you exercise? 7    How many minutes per day do you exercise? 30    Total minutes per week of exercise 210      Patient Education   Previous Diabetes Education Yes (please comment)   here   Medications Taught/reviewed insulin injection, site rotation, insulin storage and needle disposal.   taught patient how to fill, apply and use Cecur Simplicty insulin patch. Discussed care with Dr. Evette Doffing who adjusted insulin and asked him to make follow up with doctor in 2 weeks   Personal strategies to promote health Helped patient develop diabetes management plan for (enter comment)   assisted him with finding way to adminster insulin with less injection burden     Individualized Goals (developed by patient)   Medications take my medication as prescribed   using the cecur simplicity insulin patch     Post-Education Assessment   Patient understands using medications safely. Demonstrates understanding / competency    Patient understands how to develop strategies to promote health/change behavior. Demonstrates understanding / competency      Outcomes   Expected Outcomes Demonstrated interest in learning. Expect positive outcomes    Future DMSE 2 wks    Program Status Completed      Subsequent Visit   Since your last visit have you continued or begun to take your medications as prescribed? Yes   says he is doing a better job taking weekly GLP-1   Since your last visit have you had your blood pressure checked? Yes    Is your most  recent blood pressure lower, unchanged, or higher since your last visit? Higher    Since your last visit have you experienced any weight changes? No change    Since your last visit, are you checking your blood glucose at least once a day? Yes             Individualized Plan for Diabetes Self-Management Training:   Learning Objective:  Patient will have a greater understanding of diabetes self-management. Patient education plan is to attend individual and/or group sessions per assessed needs and concerns.   Plan:   Patient Instructions  Good job placing the Simplicity patch today!  Change the insulin patch on Saturday afternoon.   Call me if you need to at [phone].  Continue Toujeo at 60 units  a day  Use 7-10 ckicks for each meal 3x daily ( 7 clicks = 14 units- 10 clicks = 20 units)   Check blood sugar before and 2 hours after meals at least for today and tomorrow..   Then you can call the office on Friday if needed.   You are doing great and are a fast learner!!!  Please make a follow up with me and a doctor in 2 weeks. Pleas bring our meter.   [name] [phone]    Expected Outcomes:  Demonstrated interest in learning. Expect positive outcomes  Education material provided: Diabetes Resources  If problems or questions, patient to contact team via:  Phone  Future DSME appointment: 2 wks [name], RD 03/17/2021 11:27 AM.

## 2021-03-28 ENCOUNTER — Other Ambulatory Visit: Payer: Self-pay | Admitting: Dietician

## 2021-03-28 DIAGNOSIS — E1169 Type 2 diabetes mellitus with other specified complication: Secondary | ICD-10-CM

## 2021-03-28 NOTE — Telephone Encounter (Signed)
Patient asks for refill on Toujeo saying drug store told him we would not refill it.

## 2021-03-29 MED ORDER — TOUJEO MAX SOLOSTAR 300 UNIT/ML ~~LOC~~ SOPN: 60.0000 [IU] | PEN_INJECTOR | Freq: Every morning | SUBCUTANEOUS | 5 refills | Status: DC

## 2021-03-29 NOTE — Telephone Encounter (Signed)
Called to follow up on his request for toujeo refill. He got it and did not run out.  Follow up on new insulin patch( cecur simplicity) He likes the insulin patch and was able to change it without problems.   He reports ongoing diarrhea. "Getting rough" and wonders if it is the semaglutide or the insulin patch causing it. It started after the switch from victoza 1.8 mg daily to semaglutide 1mg  weekly.(See ID office note from 02-24-21)  He likes the weekly dosing and how much better his blood sugars are but says the diarrhea has kept him home all day today. I suggested he hold the semaglutide until I speak with a doctor. He wanted to wait until he comes in next week to see the doctor to make changes and is due to take the semaglutide today.  Patient advised that we'd call him either way.

## 2021-03-30 NOTE — Telephone Encounter (Addendum)
Spoke with Dr. Evette Doffing who suggested he take 0.5 mg weekly instead of 1 mg weekly. I called patient informed him to take 0.5 YP(95 clicks on his 1 mg pen)  instead of 1 mg dose weekly. He states that he was up all night with diarrhea last night despite not taking his semaglutide yesterday. He states he would like to continue to hold the semaglutide until his appointment with Dr. Lorin Glass next week as his blood sugars are very well controled right now with 119 fasting today and it was 77 last night, no symptoms.

## 2021-04-04 ENCOUNTER — Encounter: Payer: Self-pay | Admitting: Internal Medicine

## 2021-04-04 ENCOUNTER — Ambulatory Visit (INDEPENDENT_AMBULATORY_CARE_PROVIDER_SITE_OTHER): Payer: Medicare (Managed Care) | Admitting: Internal Medicine

## 2021-04-04 ENCOUNTER — Ambulatory Visit (INDEPENDENT_AMBULATORY_CARE_PROVIDER_SITE_OTHER): Payer: Medicare (Managed Care) | Admitting: Dietician

## 2021-04-04 ENCOUNTER — Encounter: Payer: Self-pay | Admitting: Dietician

## 2021-04-04 ENCOUNTER — Other Ambulatory Visit: Payer: Self-pay

## 2021-04-04 VITALS — BP 115/63 | HR 87 | Temp 97.8°F | Ht 73.0 in | Wt 179.5 lb

## 2021-04-04 DIAGNOSIS — R197 Diarrhea, unspecified: Secondary | ICD-10-CM

## 2021-04-04 DIAGNOSIS — E1151 Type 2 diabetes mellitus with diabetic peripheral angiopathy without gangrene: Secondary | ICD-10-CM

## 2021-04-04 DIAGNOSIS — Z794 Long term (current) use of insulin: Secondary | ICD-10-CM | POA: Diagnosis not present

## 2021-04-04 LAB — GLUCOSE, CAPILLARY: Glucose-Capillary: 163 mg/dL — ABNORMAL HIGH (ref 70–99)

## 2021-04-04 MED ORDER — OZEMPIC (0.25 OR 0.5 MG/DOSE) 2 MG/1.5ML ~~LOC~~ SOPN: 0.2500 mg | PEN_INJECTOR | SUBCUTANEOUS | 0 refills | Status: DC

## 2021-04-04 MED ORDER — LOPERAMIDE HCL 2 MG PO CAPS: 2.0000 mg | ORAL_CAPSULE | ORAL | 0 refills | Status: DC | PRN

## 2021-04-04 NOTE — Patient Instructions (Signed)
Thank you, [name] for allowing [place] to provide your care today. Today we discussed your diabetes and diarrhea.    Plan: Continue your diabetes medications with the exception of OZEMPIC (for now). If you diarrhea resolves in 2 weeks, you can start taking this lower dose of Ozempic (0.25 mg weekly). If your diarrhea does not go away in 2 weeks, call the clinic at [phone] to schedule an appointment. Otherwise follow-up in 2 months.  I have given you immodium. Take two tablets with your first poop, then after that take 1 tablet after each poop. Do not exceed 8 tablets in a day.   I have ordered the following labs for you:  Lab Orders  No laboratory test(s) ordered today      Referrals ordered today:   Referral Orders  No referral(s) requested today     I have ordered the following medication/changed the following medications:   Stop the following medications: There are no discontinued medications.   Start the following medications: No orders of the defined types were placed in this encounter.    Follow up: 2 months   Should you have any questions or concerns please call the internal medicine clinic at [phone].

## 2021-04-04 NOTE — Progress Notes (Signed)
   CC: diabetes follow-up, diarrhea  HPI:  Nathan [name] is a 70 y.o. with past medical history as noted below who presents to the clinic today for a diabetes follow-up and c/o diarrhea. Please see problem-based list for further details, assessments, and plans.   Past Medical History:  Diagnosis Date   Asthma    per 2003 [facility] pulm records pfts    Blood dyscrasia    HIV   Clotting disorder ([place])    Colon polyps    noted previous colonoscopy [facility]   DDD (degenerative disc disease)    cervical spine   Depression    Diabetes mellitus    dx 2010   Diarrhea 02/24/2021   Fever    unknwon origin   GERD (gastroesophageal reflux disease)    Head swelling 05/23/2017   Hep C w/o coma, chronic (HCC)    History of syphilis    noted [facility] records   HIV infection ([place])    undetectable viral load and CD4 ct 667 as of 11/2011   Hyperlipidemia    Hypertension    MRSA (methicillin resistant Staphylococcus aureus)    Multiple sclerosis ([place])    in remission as of 11/2011 (diagnosed late 1980s)   Pain in limb-Right Leg 10/13/2013   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[place])    2002   Pneumonia    Prosthesis fitting 03/10/2019   PVD (peripheral vascular disease) ([place])    Left Stent 07/02/2008   Scalp lesion 07/26/2017   UTI (urinary tract infection)    Wears dentures    Review of Systems: Negative aside from that listed in individualized problem based charting.  Physical Exam:  Vitals:   04/04/21 1341  BP: 115/63  Pulse: 87  Temp: 97.8 F (36.6 C)  TempSrc: Oral  SpO2: 97%  Weight: 179 lb 8 oz (81.4 kg)  Height: 6\' 1"  (1.854 m)   Physical Exam Constitutional:      General: He is not in acute distress.    Appearance: Normal appearance.  HENT:     Head: Normocephalic and atraumatic.  Eyes:     Extraocular Movements: Extraocular movements intact.     Pupils: Pupils are equal, round, and reactive to light.  Cardiovascular:     Rate and Rhythm: Normal rate and regular  rhythm.     Heart sounds: No murmur heard.   No friction rub. No gallop.  Pulmonary:     Effort: Pulmonary effort is normal.     Breath sounds: Normal breath sounds. No wheezing, rhonchi or rales.  Abdominal:     General: Abdomen is flat. Bowel sounds are normal. There is no distension.     Palpations: Abdomen is soft.     Tenderness: There is no abdominal tenderness.  Skin:    General: Skin is warm and dry.  Neurological:     General: No focal deficit present.     Mental Status: He is alert and oriented to person, place, and time. Mental status is at baseline.  Psychiatric:        Mood and Affect: Mood normal.        Behavior: Behavior normal.    Assessment & Plan:   See Encounters Tab for problem based charting.  Patient seen with Dr. Evette Doffing

## 2021-04-04 NOTE — Progress Notes (Signed)
Diabetes Self-Management Education  Visit Type:    Appt. Start Time: 1315 Appt. End Time: 0277  04/04/2021  [name], identified by name and date of birth, is a 70 y.o. male with a diagnosis of Diabetes:  Marland Kitchen Type 2 diabetes  ASSESSMENT  No semaglutide for 2 weeks ( skipped it last week) and still has diarrhea. (" Terrible"). He reports better adherence with semaglutide than liraglutide. So his starting dose may have been too high. He reports the diarrhea started with initiation of the semaglutide.  Forgot meter- was 151 after before coming here Last took actos last night Today he reports that he forgot his meter and reports he used the insulin patch to take 10 Pumps- 20 units, then ate at 11 am ,  then waited an 1.5 and got a blood sugar results of 151. Fasting today was 119, took 60 units Toujeo this morning Most of the time taking 20 units (10 pumps) using the insulin patch 3 times a day.  Has taken 7 pumps- 14 units at least 3 times in the past 4 weeks. He takes the lower dose when his blood sugar is lower before eating such as  69 has been the lowest, also had a few in the 70s. Was in the 200s a couple of time in th past week he assumed because he did not take the semaglutide.   He is not interested in an insulin pump pr CGM( CGM falls off and his phone is not compatible with the FL3.   He would like to know How long the Cecur Simplicity inserter last. I will follow up with patient to inform him.  Weight 179 lb 8 oz (81.4 kg). Body mass index is 23.68 kg/m.   Wt Readings from Last 10 Encounters:  04/04/21 179 lb 8 oz (81.4 kg)  02/24/21 182 lb (82.6 kg)  02/16/21 186 lb 3.2 oz (84.5 kg)  02/09/21 183 lb 9.6 oz (83.3 kg)  10/07/20 179 lb 4.8 oz (81.3 kg)  09/27/20 176 lb 3.2 oz (79.9 kg)  08/30/20 180 lb 9.6 oz (81.9 kg)  08/12/20 180 lb 3.2 oz (81.7 kg)  07/01/20 178 lb (80.7 kg)  05/13/20 179 lb (81.2 kg)   Lab Results  Component Value Date   HGBA1C 10.5 (A)  02/09/2021   HGBA1C 8.9 (A) 08/30/2020   HGBA1C 7.3 (A) 05/13/2020   HGBA1C 7.0 (A) 02/10/2020   HGBA1C 7.9 (A) 09/15/2019      Individualized Plan for Diabetes Self-Management Training:   Learning Objective:  Patient will have a greater understanding of diabetes self-management. Patient education plan is to attend individual and/or group sessions per assessed needs and concerns.   Plan:   There are no Patient Instructions on file for this visit.  Expected Outcomes:     Education material provided: Diabetes Resources  If problems or questions, patient to contact team via:  Phone  Future DSME appointment:  3-4 months( February- March 2023) [name], [place] 04/04/2021 2:01 PM.

## 2021-04-04 NOTE — Progress Notes (Deleted)
No semaglutide for 2 weeks ( skipped it last week) and still has diarrhea. (" Terrible") Forgot meter- was 151 after before coming here Last took actos last night Used thwe insulin patch to take 10 Pumps- 20 units, then ate at 11 am ,  then waited an 1.5 and got 151. Fasting today was 119, took 60 units this morning Most of the time taking 20 units 3 times a day.  Has taken 7 pumps- 14 units at least 3 times in the past 69 has been the lowest, also had a few in the 70s. Was in the 200s a couple of time in th past week he assumed because he did not take the semaglutide.   How long does inserter last?

## 2021-04-04 NOTE — Assessment & Plan Note (Addendum)
During his last visit 1.5 months ago, the patient continued his glargine 90 units daily, Humalog 30 units 3 times daily with meals, pioglitazone 30 mg daily, and Victoza 1.8 mg daily.  After this appointment, the patient saw [name] and was switched from Victoza to semaglutide 1 mg weekly for ease of administration (patient was taking Victoza inconsistently prior to this).  Since starting semaglutide, patient has been having profuse watery diarrhea.  He was seen by his infectious disease doctor for this last month, at which time stool panel was obtained and found to be unremarkable.  His CD4 count at that time was 488 and his viral load was less than 20, therefore no need for testing for parasites or CMV.  In the interim, the patient also started an insulin patch for his mealtime insulin.  Today, the patient continues to have watery diarrhea, about 3-4 watery stools a day.  He has not had any recent changes in his diet.  Stools also occur sometimes when he is sleeping and have been very inconvenient for him during the day.  He had been scheduled to take semaglutide on Tuesdays, however he held his last dose (on 11/22).  He has not yet noticed a difference in the diarrhea.  Otherwise, the patient is very pleased with his sugars at home.  He was especially pleased with the insulin pump for his mealtime insulin.  His fasting sugar was 119 today.  His sugars have been in the 200s a couple of times after holding his semaglutide.  Patient did not bring his glucometer today.  Otherwise, he is happy with his glucose control.    P: We discussed that, given negative testing so far for his diarrhea, this is likely from the patient starting a higher dose of Ozempic.  We advised the patient to hold Ozempic for the next 2 weeks.  If his diarrhea resolves by then, he can start taking Ozempic 0.25 mg weekly.  If the diarrhea does not go away in 2 weeks, he will contact her office for follow-up.  Otherwise, follow-up in 2  months for repeat A1c.  The diarrhea, we have also given him Imodium as needed with each stool, not to exceed 8 tablets/day.

## 2021-04-04 NOTE — Patient Instructions (Signed)
Hi Nathan [name],  I'd like to follow up with you in 3 months, but my 2023 schedule is not in yet. I will do my best to ask the front office to contact you with an appointment.   Hope you feel better soon and have nice holidays. Call me anytime!  [name] [phone]

## 2021-04-05 NOTE — Progress Notes (Signed)
Internal Medicine Clinic Attending ? ?I saw and evaluated the patient.  I personally confirmed the key portions of the history and exam documented by Dr. Bonanno and I reviewed pertinent patient test results.  The assessment, diagnosis, and plan were formulated together and I agree with the documentation in the resident?s note. ? ?

## 2021-04-25 ENCOUNTER — Encounter: Payer: Self-pay | Admitting: *Deleted

## 2021-04-28 ENCOUNTER — Other Ambulatory Visit: Payer: Self-pay

## 2021-04-28 DIAGNOSIS — I739 Peripheral vascular disease, unspecified: Secondary | ICD-10-CM

## 2021-04-28 MED ORDER — CILOSTAZOL 50 MG PO TABS: 50.0000 mg | ORAL_TABLET | Freq: Two times a day (BID) | ORAL | 3 refills | Status: DC

## 2021-04-28 NOTE — Telephone Encounter (Signed)
Will send in refill. I don't see if we've followed up on if it is helping his symptoms so let's make sure to do that at his next visit!

## 2021-05-15 ENCOUNTER — Encounter: Payer: Self-pay | Admitting: Gastroenterology

## 2021-05-30 ENCOUNTER — Encounter: Payer: Self-pay | Admitting: Internal Medicine

## 2021-05-30 ENCOUNTER — Ambulatory Visit (INDEPENDENT_AMBULATORY_CARE_PROVIDER_SITE_OTHER): Payer: Medicare (Managed Care) | Admitting: Internal Medicine

## 2021-05-30 ENCOUNTER — Other Ambulatory Visit: Payer: Self-pay

## 2021-05-30 VITALS — BP 121/63 | HR 81 | Temp 98.1°F | Resp 28 | Ht 73.0 in | Wt 187.3 lb

## 2021-05-30 DIAGNOSIS — E1151 Type 2 diabetes mellitus with diabetic peripheral angiopathy without gangrene: Secondary | ICD-10-CM | POA: Diagnosis not present

## 2021-05-30 DIAGNOSIS — Z794 Long term (current) use of insulin: Secondary | ICD-10-CM

## 2021-05-30 DIAGNOSIS — I10 Essential (primary) hypertension: Secondary | ICD-10-CM | POA: Diagnosis not present

## 2021-05-30 LAB — POCT GLYCOSYLATED HEMOGLOBIN (HGB A1C): Hemoglobin A1C: 7.6 % — AB (ref 4.0–5.6)

## 2021-05-30 LAB — GLUCOSE, CAPILLARY: Glucose-Capillary: 239 mg/dL — ABNORMAL HIGH (ref 70–99)

## 2021-05-30 MED ORDER — OZEMPIC (0.25 OR 0.5 MG/DOSE) 2 MG/1.5ML ~~LOC~~ SOPN: 0.5000 mg | PEN_INJECTOR | SUBCUTANEOUS | 0 refills | Status: DC

## 2021-05-30 NOTE — Patient Instructions (Signed)
Dear Mr. Latendresse,  Thank you for trusting [place] with your care today. Today we discussed your diabetes. Your A1c has improved nicely, keep up the great work. Please continue taking your medications as you have been. I have sent in a prescription for the Ozempic 0.5mg . Please take this once weekly.  Your blood pressure is well controlled, please continue taking your blood pressure medications as you have been. We will see you back in 3 months for a blood sugar and blood pressure check.

## 2021-05-30 NOTE — Progress Notes (Signed)
° °  CC: follow up and blood pressure check  HPI:[name] is a 71 y.o. male who presents for evaluation of blood sugar and pressure check. Please see individual problem based A/P for details.   Depression, PHQ-9: Based on the patients  [place] Visit from 05/30/2021 in [place]  PHQ-9 Total Score 2      score we have 2.  Past Medical History:  Diagnosis Date   Asthma    per 2003 [facility] pulm records pfts    Blood dyscrasia    HIV   Clotting disorder ([place])    Colon polyps    noted previous colonoscopy [facility]   DDD (degenerative disc disease)    cervical spine   Depression    Diabetes mellitus    dx 2010   Diarrhea 02/24/2021   Fever    unknwon origin   GERD (gastroesophageal reflux disease)    Head swelling 05/23/2017   Hep C w/o coma, chronic (HCC)    History of syphilis    noted [facility] records   HIV infection ([place])    undetectable viral load and CD4 ct 667 as of 11/2011   Hyperlipidemia    Hypertension    MRSA (methicillin resistant Staphylococcus aureus)    Multiple sclerosis ([place])    in remission as of 11/2011 (diagnosed late 1980s)   Pain in limb-Right Leg 10/13/2013   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[place])    2002   Pneumonia    Prosthesis fitting 03/10/2019   PVD (peripheral vascular disease) ([place])    Left Stent 07/02/2008   Scalp lesion 07/26/2017   UTI (urinary tract infection)    Wears dentures    Review of Systems:   Review of Systems  Constitutional: Negative.   HENT: Negative.    Eyes: Negative.   Respiratory: Negative.    Cardiovascular: Negative.   Gastrointestinal: Negative.   Genitourinary: Negative.   Musculoskeletal: Negative.   Skin: Negative.   Neurological: Negative.   Endo/Heme/Allergies: Negative.   Psychiatric/Behavioral: Negative.      Physical Exam: Vitals:   05/30/21 1054  BP: 121/63  Pulse: 81  Resp: (!) 28  Temp: 98.1 F (36.7 C)  TempSrc: Oral  SpO2: 98%  Weight: 187 lb  4.8 oz (85 kg)  Height: 6\' 1"  (1.854 m)     General: alert and oriented HEENT: Conjunctiva nl , antiicteric sclerae, moist mucous membranes, no exudate or erythema Cardiovascular: Normal rate, regular rhythm.  No murmurs, rubs, or gallops Pulmonary : Equal breath sounds, No wheezes, rales, or rhonchi Abdominal: soft, nontender,  bowel sounds present Ext: No edema in lower extremities, no tenderness to palpation of lower extremities. RLE surgically absent with prosthetic.  Assessment & Plan:   See Encounters Tab for problem based charting.  Patient discussed with Dr. Philipp Ovens

## 2021-06-02 ENCOUNTER — Telehealth: Payer: Self-pay

## 2021-06-05 ENCOUNTER — Encounter: Payer: Self-pay | Admitting: Internal Medicine

## 2021-06-05 NOTE — Assessment & Plan Note (Addendum)
Patient reports compliance with medications. Reports blood sugar typically funs in 200 with occasional 300's, which is improved since starting Ozempic compared to prior. He reports that he was previously on 1mg  Ozempic but this was decreased due to diarrhea. He reports that he feels as though the 1mg  was more effective than 0.25 and is wanting to go back.  A1c showing much improvement, down to 7.6.  Will continue current regimen of Humalog, toujeo. Increased Ozempic to 0.5mg . Plan for follow up 3 months

## 2021-06-05 NOTE — Assessment & Plan Note (Signed)
Blood pressure well controlled today. Continue current regimen Lisinopril 40mg 

## 2021-06-06 NOTE — Progress Notes (Signed)
Internal Medicine Clinic Attending ° °Case discussed with Dr. Gawaluck  At the time of the visit.  We reviewed the resident’s history and exam and pertinent patient test results.  I agree with the assessment, diagnosis, and plan of care documented in the resident’s note.  °

## 2021-06-07 ENCOUNTER — Other Ambulatory Visit: Payer: Self-pay | Admitting: *Deleted

## 2021-06-07 DIAGNOSIS — Z794 Long term (current) use of insulin: Secondary | ICD-10-CM

## 2021-06-07 DIAGNOSIS — E1169 Type 2 diabetes mellitus with other specified complication: Secondary | ICD-10-CM

## 2021-06-09 MED ORDER — TOUJEO MAX SOLOSTAR 300 UNIT/ML ~~LOC~~ SOPN: 60.0000 [IU] | PEN_INJECTOR | Freq: Every morning | SUBCUTANEOUS | 5 refills | Status: DC

## 2021-06-13 ENCOUNTER — Telehealth: Payer: Self-pay

## 2021-06-13 MED ORDER — GLUCOSE BLOOD VI STRP: ORAL_STRIP | 12 refills | Status: DC

## 2021-06-13 NOTE — Addendum Note (Signed)
Addended by: Delene Ruffini T on: 06/13/2021 05:24 AM   Modules accepted: Orders

## 2021-06-21 ENCOUNTER — Other Ambulatory Visit: Payer: Self-pay

## 2021-06-21 ENCOUNTER — Encounter (HOSPITAL_COMMUNITY): Payer: Self-pay | Admitting: Emergency Medicine

## 2021-06-21 ENCOUNTER — Telehealth: Payer: Self-pay

## 2021-06-21 ENCOUNTER — Emergency Department (HOSPITAL_COMMUNITY)
Admission: EM | Admit: 2021-06-21 | Discharge: 2021-06-21 | Disposition: A | Discharge status: Home / Self Care | Payer: Medicare (Managed Care) | Attending: Emergency Medicine | Admitting: Emergency Medicine

## 2021-06-21 ENCOUNTER — Emergency Department (HOSPITAL_COMMUNITY): Payer: Medicare (Managed Care)

## 2021-06-21 DIAGNOSIS — Z79899 Other long term (current) drug therapy: Secondary | ICD-10-CM | POA: Diagnosis not present

## 2021-06-21 DIAGNOSIS — J069 Acute upper respiratory infection, unspecified: Secondary | ICD-10-CM

## 2021-06-21 DIAGNOSIS — B9789 Other viral agents as the cause of diseases classified elsewhere: Secondary | ICD-10-CM | POA: Diagnosis not present

## 2021-06-21 DIAGNOSIS — Z794 Long term (current) use of insulin: Secondary | ICD-10-CM | POA: Insufficient documentation

## 2021-06-21 DIAGNOSIS — I10 Essential (primary) hypertension: Secondary | ICD-10-CM | POA: Diagnosis not present

## 2021-06-21 DIAGNOSIS — Z21 Asymptomatic human immunodeficiency virus [HIV] infection status: Secondary | ICD-10-CM | POA: Insufficient documentation

## 2021-06-21 DIAGNOSIS — R0981 Nasal congestion: Secondary | ICD-10-CM | POA: Diagnosis present

## 2021-06-21 DIAGNOSIS — E119 Type 2 diabetes mellitus without complications: Secondary | ICD-10-CM | POA: Diagnosis not present

## 2021-06-21 DIAGNOSIS — J45909 Unspecified asthma, uncomplicated: Secondary | ICD-10-CM | POA: Diagnosis not present

## 2021-06-21 DIAGNOSIS — Z7902 Long term (current) use of antithrombotics/antiplatelets: Secondary | ICD-10-CM | POA: Insufficient documentation

## 2021-06-21 DIAGNOSIS — R0602 Shortness of breath: Secondary | ICD-10-CM | POA: Insufficient documentation

## 2021-06-21 DIAGNOSIS — Z20822 Contact with and (suspected) exposure to covid-19: Secondary | ICD-10-CM | POA: Diagnosis not present

## 2021-06-21 LAB — CBC WITH DIFFERENTIAL/PLATELET
Abs Immature Granulocytes: 0.07 10*3/uL (ref 0.00–0.07)
Basophils Absolute: 0 10*3/uL (ref 0.0–0.1)
Basophils Relative: 0 %
Eosinophils Absolute: 0.1 10*3/uL (ref 0.0–0.5)
Eosinophils Relative: 2 %
HCT: 38.8 % — ABNORMAL LOW (ref 39.0–52.0)
Hemoglobin: 13 g/dL (ref 13.0–17.0)
Immature Granulocytes: 1 %
Lymphocytes Relative: 38 %
Lymphs Abs: 3.1 10*3/uL (ref 0.7–4.0)
MCH: 31.6 pg (ref 26.0–34.0)
MCHC: 33.5 g/dL (ref 30.0–36.0)
MCV: 94.4 fL (ref 80.0–100.0)
Monocytes Absolute: 0.6 10*3/uL (ref 0.1–1.0)
Monocytes Relative: 8 %
Neutro Abs: 4.4 10*3/uL (ref 1.7–7.7)
Neutrophils Relative %: 51 %
Platelets: 207 10*3/uL (ref 150–400)
RBC: 4.11 MIL/uL — ABNORMAL LOW (ref 4.22–5.81)
RDW: 13.2 % (ref 11.5–15.5)
WBC: 8.4 10*3/uL (ref 4.0–10.5)
nRBC: 0 % (ref 0.0–0.2)

## 2021-06-21 LAB — BASIC METABOLIC PANEL
Anion gap: 10 (ref 5–15)
BUN: 8 mg/dL (ref 8–23)
CO2: 24 mmol/L (ref 22–32)
Calcium: 9.1 mg/dL (ref 8.9–10.3)
Chloride: 99 mmol/L (ref 98–111)
Creatinine, Ser: 1.07 mg/dL (ref 0.61–1.24)
GFR, Estimated: >60 mL/min (ref >60)
Glucose, Bld: 216 mg/dL — ABNORMAL HIGH (ref 70–99)
Potassium: 3.8 mmol/L (ref 3.5–5.1)
Sodium: 133 mmol/L — ABNORMAL LOW (ref 135–145)

## 2021-06-21 LAB — RESP PANEL BY RT-PCR (FLU A&B, COVID) ARPGX2
Influenza A by PCR: NEGATIVE
Influenza B by PCR: NEGATIVE
SARS Coronavirus 2 by RT PCR: NEGATIVE

## 2021-06-21 NOTE — ED Provider Notes (Signed)
[place] EMERGENCY DEPARTMENT Provider Note   CSN: [id number] Arrival date & time: 06/21/21  8416     History  Chief Complaint  Patient presents with   Shortness of Breath   Nasal Congestion   Weakness    Nathan [name] is a 71 y.o. male.  71 year old male with prior medical history as detailed below presents for evaluation.  Patient complains of mild upper respiratory congestion with cough.  This is been ongoing for the last week and a half.  Patient is primarily concerned about the possibility of pneumonia.  He denies fever.  He reports that his symptoms are actually improved this morning.  He denies chest pain.  He denies weakness with exertion.  He denies nausea or vomiting.  He denies other complaint.  The history is provided by the patient and medical records.  Illness Location:  Upper respiratory congestion, cough, fatigue Severity:  Mild Onset quality:  Gradual Duration:  2 weeks Timing:  Rare Progression:  Partially resolved Chronicity:  New     Home Medications Prior to Admission medications   Medication Sig Start Date End Date Taking? Authorizing Provider  atorvastatin (LIPITOR) 40 MG tablet TAKE 1 TABLET(40 MG) BY MOUTH DAILY 02/01/21   Jose Persia, MD  bictegravir-emtricitabine-tenofovir AF (BIKTARVY) 50-200-25 MG TABS tablet Take 1 tablet by mouth daily. 02/24/21   Truman Hayward, MD  Blood Glucose Monitoring Suppl (BAYER CONTOUR MONITOR) W/DEVICE KIT Use to check blood sugar as instructed up to 4 times a day 07/11/11   Bartholomew Crews, MD  cilostazol (PLETAL) 50 MG tablet Take 1 tablet (50 mg total) by mouth 2 (two) times daily. 04/28/21   Farrel Gordon, DO  clopidogrel (PLAVIX) 75 MG tablet Take 1 tablet (75 mg total) by mouth daily. 02/14/21   Mitzi Hansen, MD  diclofenac Sodium (VOLTAREN) 1 % GEL Apply 4 g topically 4 (four) times daily as needed. 02/10/21   Virl Axe, MD  ezetimibe (ZETIA) 10 MG tablet Take 1 tablet  (10 mg total) by mouth daily. 01/31/21 01/31/22  Mitzi Hansen, MD  glucose blood (CONTOUR NEXT TEST) test strip 1 each by Other route 5 (five) times daily. Use 5 times daily to check blood sugar. 08/30/20   Jeralyn Bennett, MD  glucose blood test strip Use as instructed 06/13/21   Delene Ruffini, MD  ibuprofen (ADVIL) 400 MG tablet Take by mouth. 03/02/20   [provider]  injection device for insulin (CEQUR SIMPLICITY 2U) DEVI Apply as directed once every 3 days 03/03/21   Virl Axe, MD  Injection Device for Insulin (CEQUR SIMPLICITY INSERTER) MISC Apply as directed every 3 days 03/03/21   Virl Axe, MD  insulin glargine, 2 Unit Dial, (TOUJEO MAX SOLOSTAR) 300 UNIT/ML Solostar Pen Inject 60 Units into the skin in the morning. 06/09/21   Delene Ruffini, MD  insulin lispro (HUMALOG KWIKPEN) 200 UNIT/ML KwikPen Inject 30 Units into the skin with breakfast, with lunch, and with evening meal. 10/26/20   Sanjuan Dame, MD  insulin lispro (HUMALOG) 100 UNIT/ML injection Use to fill Cecur Simplicity up to 606 units every three days to use for mealtime insulin injections three times a day Patient taking differently: Use to fill Cecur Simplicity up to 301 units every three days to use for mealtime insulin injections three times a day. Per DR. Evette Doffing he is to take 60-10 units ( 9-32 clicks with his 2unit Cecur Simplicity insulin patch)  three times daily with meals. 03/03/21  Virl Axe, MD  Insulin Pen Needle (BD PEN NEEDLE NANO U/F) 32G X 4 MM MISC USE AS DIRECTED FOUR TIMES DAILY. Dx code  E11.42 08/30/20   Jeralyn Bennett, MD  Lancet Devices (BAYER MICROLET 2 LANCING [facility]) MISC Use to check blood sugar up to 3 ties a day 04/08/15   Mignon Pine, DO  lisinopril (ZESTRIL) 40 MG tablet TAKE 1 TABLET BY MOUTH DAILY. HOLD IF SYSTOLIC BLOOD PRESSURE IS LESS THAN 110 12/01/20   Jose Persia, MD  loperamide (IMODIUM) 2 MG capsule Take 1 capsule (2 mg total) by mouth as needed  for diarrhea or loose stools. 04/04/21   Orvis Brill, MD  omeprazole (PRILOSEC) 40 MG capsule TAKE 1 CAPSULE(40 MG) BY MOUTH DAILY 02/22/21   Demaio, Alexa, MD  pioglitazone (ACTOS) 30 MG tablet Take 1 tablet (30 mg total) by mouth daily. 02/15/21   Demaio, Alexa, MD  Semaglutide,0.25 or 0.5MG/DOS, (OZEMPIC, 0.25 OR 0.5 MG/DOSE,) 2 MG/1.5ML SOPN Inject 0.5 mg into the skin once a week. 05/30/21   Delene Ruffini, MD      Allergies    Sulfonamide derivatives    Review of Systems   Review of Systems  All other systems reviewed and are negative.  Physical Exam Updated Vital Signs BP 134/60 (BP Location: Right Arm)    Pulse 78    Temp 98.3 F (36.8 C) (Oral)    Resp 16    SpO2 99%  Physical Exam Vitals and nursing note reviewed.  Constitutional:      General: He is not in acute distress.    Appearance: Normal appearance. He is well-developed.  HENT:     Head: Normocephalic and atraumatic.  Eyes:     Conjunctiva/sclera: Conjunctivae normal.     Pupils: Pupils are equal, round, and reactive to light.  Cardiovascular:     Rate and Rhythm: Normal rate and regular rhythm.     Heart sounds: Normal heart sounds.  Pulmonary:     Effort: Pulmonary effort is normal. No respiratory distress.     Breath sounds: Normal breath sounds.  Abdominal:     General: There is no distension.     Palpations: Abdomen is soft.     Tenderness: There is no abdominal tenderness.  Musculoskeletal:        General: No deformity. Normal range of motion.     Cervical back: Normal range of motion and neck supple.  Skin:    General: Skin is warm and dry.  Neurological:     General: No focal deficit present.     Mental Status: He is alert and oriented to person, place, and time.    ED Results / Procedures / Treatments   Labs (all labs ordered are listed, but only abnormal results are displayed) Labs Reviewed  CBC WITH DIFFERENTIAL/PLATELET - Abnormal; Notable for the following components:       Result Value   RBC 4.11 (*)    HCT 38.8 (*)    All other components within normal limits  RESP PANEL BY RT-PCR (FLU A&B, COVID) [id number]  BASIC METABOLIC PANEL    EKG EKG Interpretation  Date/Time:  Tuesday June 21 2021 10:07:25 EST Ventricular Rate:  74 PR Interval:  170 QRS Duration: 106 QT Interval:  390 QTC Calculation: 432 R Axis:   14 Text Interpretation: Normal sinus rhythm Cannot rule out Anterior infarct , age undetermined Abnormal ECG When compared with ECG of 29-Dec-2018 10:41, PREVIOUS ECG IS PRESENT Confirmed by Dene Gentry [phone])  on 06/21/2021 10:59:42 AM  Radiology DG Chest 2 View  Result Date: 06/21/2021 CLINICAL DATA:  Shortness of breath and congestion. EXAM: CHEST - 2 VIEW COMPARISON:  Chest x-ray dated January 10, 2019. FINDINGS: The heart size and mediastinal contours are within normal limits. Both lungs are clear. The visualized skeletal structures are unremarkable. IMPRESSION: No active cardiopulmonary disease. Electronically Signed   By: Titus Dubin M.D.   On: 06/21/2021 10:34    Procedures Procedures    Medications Ordered in ED Medications - No data to display  ED Course/ Medical Decision Making/ A&P                           Medical Decision Making   Medical Screen Complete  This patient presented to the ED with complaint of congestion, URI symptoms.  This complaint involves an extensive number of treatment options. The initial differential diagnosis includes, but is not limited to, viral URI, bacterial pneumonia, pneumonia, metabolic abnormality  This presentation is: Acute, Self-Limited, Previously Undiagnosed, Uncertain Prognosis, Complicated, Systemic Symptoms, and Threat to Life/Bodily Function Patient is presenting with approximately 2 weeks of mild URI symptoms.  He actually feels significantly better today than he did several days ago.  Patient is requesting reassurance that he does not have significant infection or other  pathology.  Screening labs obtained are without significant abnormality.  Patient is mildly hyponatremic with a sodium of 133.  Patient is encouraged to improve his fluid and diet to compensate for the sodium.  Chest x-ray is without evidence of acute pathology.  COVID and flu testing are negative.  Patient is reassured by findings from today's evaluation. He does understand need for close follow-up.  Strict return precautions given and understood.    Co morbidities that complicated the patient's evaluation  HIV, asthma, diabetes, hep C, hyperlipidemia, hypertension   Additional history obtained:  External records from outside sources obtained and reviewed including prior ED visits and prior Inpatient records.    Lab Tests:  I ordered and personally interpreted labs.  The pertinent results include: CBC, BMP, COVID, flu   Imaging Studies ordered:  I ordered imaging studies including chest x-ray I independently visualized and interpreted obtained imaging which showed NAD I agree with the radiologist interpretation.   Cardiac Monitoring:  The patient was maintained on a cardiac monitor.  I personally viewed and interpreted the cardiac monitor which showed an underlying rhythm of: NSR Problem List / ED Course:  Constellation of symptoms are most consistent with likely resolving viral URI   Reevaluation:  After the interventions noted above, I reevaluated the patient and found that they have: improved    Disposition:  After consideration of the diagnostic results and the patients response to treatment, I feel that the patent would benefit from close outpatient follow-up.          Final Clinical Impression(s) / ED Diagnoses Final diagnoses:  Viral URI with cough    Rx / DC Orders ED Discharge Orders     None         Valarie Merino, MD 06/21/21 1159

## 2021-06-21 NOTE — ED Triage Notes (Signed)
Pt. Stated, ive had lots and lots of congestion and SOB for over a week.

## 2021-06-21 NOTE — Telephone Encounter (Signed)
Return pt's call who stated he thinks he has pneumonia. He's very sob and weak; he "cannot hardly walk across the floor". Stated he started to go to the ED last night but felled asleep. "I can't breathe". There are no open appts until next week. Instructed pt to got to the ED or call 911; stated he will.

## 2021-06-21 NOTE — Discharge Instructions (Addendum)
Drink plenty of fluids.  Rest.  Return for any problem.

## 2021-06-21 NOTE — ED Provider Triage Note (Signed)
Emergency Medicine Provider Triage Evaluation Note  Nathan [name] , a 71 y.o. male  was evaluated in triage.  Pt complains of congestion, shortness of breath for the last week or so.  Patient denies any fever, chills, nausea, vomiting.  Patient reports history of tobacco use, COPD, has not used his inhaler in many years.  Patient reports that he has had some on and off headache but not time evaluation.  Patient denies any recent sick contacts.  Review of Systems  Positive: Shob Negative: Chest pain, fever, nausea, vomiting  Physical Exam  BP 134/60 (BP Location: Right Arm)    Pulse 78    Temp 98.3 F (36.8 C) (Oral)    Resp 16    SpO2 99%  Gen:   Awake, no distress   Resp:  Normal effort  MSK:   Moves extremities without difficulty  Other:    Medical Decision Making  Medically screening exam initiated at 10:03 AM.  Appropriate orders placed.  EUGEAN ARNOTT was informed that the remainder of the evaluation will be completed by another provider, this initial triage assessment does not replace that evaluation, and the importance of remaining in the ED until their evaluation is complete.  Workup initiated   Anselmo Pickler, [place] 06/21/21 1004

## 2021-06-21 NOTE — ED Notes (Signed)
Patient discharge instructions reviewed with the patient. The patient verbalized understanding of instructions. Patient discharged. 

## 2021-06-21 NOTE — Telephone Encounter (Signed)
Pt is requesting a call back .. he was wanting to do an appt today but we are completely booked .. pt stated he thinks he has pneumonia for the past two wks .. he has  chest congestion , SOB, and very weak .. he stated that he has pneumonia 2 times already so he says it feels exactly like it

## 2021-07-04 ENCOUNTER — Other Ambulatory Visit: Payer: Self-pay

## 2021-07-04 DIAGNOSIS — E119 Type 2 diabetes mellitus without complications: Secondary | ICD-10-CM

## 2021-07-06 MED ORDER — CONTOUR NEXT TEST VI STRP: 1.0000 | ORAL_STRIP | Freq: Every day | 3 refills | Status: DC

## 2021-07-14 ENCOUNTER — Other Ambulatory Visit: Payer: Self-pay | Admitting: Dietician

## 2021-07-14 DIAGNOSIS — E1151 Type 2 diabetes mellitus with diabetic peripheral angiopathy without gangrene: Secondary | ICD-10-CM

## 2021-07-14 NOTE — Telephone Encounter (Signed)
Patient called to request a Freestyle lite Meter, strips and lancet, he would like a 90 day supply for 5x/day sent to  [facility] on [address]. He Sugar is better using insulin patch.  ?

## 2021-07-19 MED ORDER — FREESTYLE FREEDOM LITE W/DEVICE KIT: PACK | 1 refills | Status: DC

## 2021-07-19 MED ORDER — FREESTYLE LANCETS MISC: 3 refills | Status: AC

## 2021-07-19 MED ORDER — FREESTYLE LITE TEST VI STRP: ORAL_STRIP | 3 refills | Status: DC

## 2021-07-19 NOTE — Telephone Encounter (Signed)
Patient calls again for testing supplies. Chat sent to Dr. Elliot Gurney.  ?

## 2021-08-11 ENCOUNTER — Other Ambulatory Visit: Payer: Medicare (Managed Care)

## 2021-08-16 ENCOUNTER — Other Ambulatory Visit: Payer: Self-pay

## 2021-08-16 ENCOUNTER — Other Ambulatory Visit: Payer: Medicare (Managed Care)

## 2021-08-16 DIAGNOSIS — B20 Human immunodeficiency virus [HIV] disease: Secondary | ICD-10-CM

## 2021-08-16 DIAGNOSIS — Z79899 Other long term (current) drug therapy: Secondary | ICD-10-CM | POA: Diagnosis not present

## 2021-08-17 LAB — T-HELPER CELL (CD4) - (RCID CLINIC ONLY)
CD4 % Helper T Cell: 19 % — ABNORMAL LOW (ref 33–65)
CD4 T Cell Abs: 549 /uL (ref 400–1790)

## 2021-08-18 ENCOUNTER — Other Ambulatory Visit: Payer: Self-pay | Admitting: Internal Medicine

## 2021-08-18 DIAGNOSIS — K219 Gastro-esophageal reflux disease without esophagitis: Secondary | ICD-10-CM

## 2021-08-18 NOTE — Telephone Encounter (Signed)
Next appt scheduled 4/24 with Dr Court Joy. ?

## 2021-08-20 ENCOUNTER — Other Ambulatory Visit: Payer: Self-pay | Admitting: Internal Medicine

## 2021-08-20 DIAGNOSIS — I739 Peripheral vascular disease, unspecified: Secondary | ICD-10-CM

## 2021-08-20 LAB — CBC WITH DIFFERENTIAL/PLATELET
Absolute Monocytes: 356 cells/uL (ref 200–950)
Basophils Absolute: 38 cells/uL (ref 0–200)
Basophils Relative: 0.7 %
Eosinophils Absolute: 162 cells/uL (ref 15–500)
Eosinophils Relative: 3 %
HCT: 47.4 % (ref 38.5–50.0)
Hemoglobin: 16.1 g/dL (ref 13.2–17.1)
Lymphs Abs: 3100 cells/uL (ref 850–3900)
MCH: 31.7 pg (ref 27.0–33.0)
MCHC: 34 g/dL (ref 32.0–36.0)
MCV: 93.3 fL (ref 80.0–100.0)
MPV: 8.7 fL (ref 7.5–12.5)
Monocytes Relative: 6.6 %
Neutro Abs: 1744 cells/uL (ref 1500–7800)
Neutrophils Relative %: 32.3 %
Platelets: 229 10*3/uL (ref 140–400)
RBC: 5.08 10*6/uL (ref 4.20–5.80)
RDW: 13.2 % (ref 11.0–15.0)
Total Lymphocyte: 57.4 %
WBC: 5.4 10*3/uL (ref 3.8–10.8)

## 2021-08-20 LAB — COMPLETE METABOLIC PANEL WITH GFR
AG Ratio: 1.6 (calc) (ref 1.0–2.5)
ALT: 20 U/L (ref 9–46)
AST: 20 U/L (ref 10–35)
Albumin: 4.2 g/dL (ref 3.6–5.1)
Alkaline phosphatase (APISO): 58 U/L (ref 35–144)
BUN: 8 mg/dL (ref 7–25)
CO2: 27 mmol/L (ref 20–32)
Calcium: 9.9 mg/dL (ref 8.6–10.3)
Chloride: 103 mmol/L (ref 98–110)
Creat: 1.06 mg/dL (ref 0.70–1.28)
Globulin: 2.7 g/dL (calc) (ref 1.9–3.7)
Glucose, Bld: 126 mg/dL — ABNORMAL HIGH (ref 65–99)
Potassium: 4.3 mmol/L (ref 3.5–5.3)
Sodium: 139 mmol/L (ref 135–146)
Total Bilirubin: 0.4 mg/dL (ref 0.2–1.2)
Total Protein: 6.9 g/dL (ref 6.1–8.1)
eGFR: 75 mL/min/{1.73_m2} (ref >=60)

## 2021-08-20 LAB — LIPID PANEL
Cholesterol: 113 mg/dL (ref <200)
HDL: 45 mg/dL (ref >=40)
LDL Cholesterol (Calc): 45 mg/dL (calc)
Non-HDL Cholesterol (Calc): 68 mg/dL (calc) (ref <130)
Total CHOL/HDL Ratio: 2.5 (calc) (ref <5.0)
Triglycerides: 144 mg/dL (ref <150)

## 2021-08-20 LAB — RPR: RPR Ser Ql: NONREACTIVE

## 2021-08-20 LAB — HIV-1 RNA QUANT-NO REFLEX-BLD
HIV 1 RNA Quant: NOT DETECTED copies/mL
HIV-1 RNA Quant, Log: NOT DETECTED Log copies/mL

## 2021-08-25 ENCOUNTER — Other Ambulatory Visit: Payer: Self-pay

## 2021-08-25 ENCOUNTER — Encounter: Payer: Self-pay | Admitting: Infectious Disease

## 2021-08-25 ENCOUNTER — Ambulatory Visit (INDEPENDENT_AMBULATORY_CARE_PROVIDER_SITE_OTHER): Payer: Medicare (Managed Care) | Admitting: Infectious Disease

## 2021-08-25 VITALS — BP 118/66 | HR 73 | Temp 97.4°F | Wt 185.0 lb

## 2021-08-25 DIAGNOSIS — E1169 Type 2 diabetes mellitus with other specified complication: Secondary | ICD-10-CM

## 2021-08-25 DIAGNOSIS — R296 Repeated falls: Secondary | ICD-10-CM

## 2021-08-25 DIAGNOSIS — I10 Essential (primary) hypertension: Secondary | ICD-10-CM | POA: Diagnosis not present

## 2021-08-25 DIAGNOSIS — R42 Dizziness and giddiness: Secondary | ICD-10-CM | POA: Diagnosis not present

## 2021-08-25 DIAGNOSIS — B20 Human immunodeficiency virus [HIV] disease: Secondary | ICD-10-CM

## 2021-08-25 DIAGNOSIS — E1151 Type 2 diabetes mellitus with diabetic peripheral angiopathy without gangrene: Secondary | ICD-10-CM | POA: Diagnosis not present

## 2021-08-25 DIAGNOSIS — W19XXXA Unspecified fall, initial encounter: Secondary | ICD-10-CM | POA: Insufficient documentation

## 2021-08-25 DIAGNOSIS — E785 Hyperlipidemia, unspecified: Secondary | ICD-10-CM

## 2021-08-25 HISTORY — DX: Repeated falls: R29.6

## 2021-08-25 HISTORY — DX: Dizziness and giddiness: R42

## 2021-08-25 HISTORY — DX: Unspecified fall, initial encounter: W19.XXXA

## 2021-08-25 MED ORDER — BIKTARVY 50-200-25 MG PO TABS: 1.0000 | ORAL_TABLET | Freq: Every day | ORAL | 11 refills | Status: DC

## 2021-08-25 NOTE — Progress Notes (Signed)
?Chief complaint: recent fall also with dizziness at time ?Subjective:  ? ? Patient ID: Nathan [name], male    DOB: [birth year], 71 y.o.   MRN: [id number] ? ?HPI ? ?Nathan [name] is a 71 y.o. male whose HIV has been perfectly suppressed on Biktarvy and BID AZT ? ?We ran [employer] which is shown below: ? ? ? ? ? ?Nathan [name] was switched to  [facility] and has remained perfectly suppressed on BIKTARVY. ? ?He has had some more falls which he believes is due to his having one leg (is an amputee) ? ?Has had dizziness at times without clear cause. ? ? ? ? ?Past Medical History:  ?Diagnosis Date  ? Asthma   ? per 2003 [facility] pulm records pfts   ? Blood dyscrasia   ? HIV  ? Clotting disorder ([place])   ? Colon polyps   ? noted previous colonoscopy [facility]  ? DDD (degenerative disc disease)   ? cervical spine  ? Depression   ? Diabetes mellitus   ? dx 2010  ? Diarrhea 02/24/2021  ? Dizziness 08/25/2021  ? Falls 08/25/2021  ? Fever   ? unknwon origin  ? GERD (gastroesophageal reflux disease)   ? Head swelling 05/23/2017  ? Hep C w/o coma, chronic (HCC)   ? History of syphilis   ? noted [facility] records  ? HIV infection ([place])   ? undetectable viral load and CD4 ct 667 as of 11/2011  ? Hyperlipidemia   ? Hypertension   ? MRSA (methicillin resistant Staphylococcus aureus)   ? Multiple sclerosis ([place])   ? in remission as of 11/2011 (diagnosed late 1980s)  ? Pain in limb-Right Leg 10/13/2013  ? 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[place])   ? 2002  ? Pneumonia   ? Prosthesis fitting 03/10/2019  ? PVD (peripheral vascular disease) ([place])   ? Left Stent 07/02/2008  ? Scalp lesion 07/26/2017  ? UTI (urinary tract infection)   ? Wears dentures   ? ? ?Past Surgical History:  ?Procedure Laterality Date  ? ABDOMINAL AORTOGRAM W/LOWER EXTREMITY N/A 08/07/2017  ? Procedure: ABDOMINAL AORTOGRAM W/LOWER EXTREMITY;  Surgeon: Serafina Mitchell, MD;  Location: [facility];  Service: Cardiovascular;  Laterality: N/A;  ? ABOVE KNEE LEG AMPUTATION    ? Right Leg   ? COLONOSCOPY W/ BIOPSIES AND POLYPECTOMY    ? LOWER EXTREMITY ANGIOGRAM Left 12/26/2011  ? Procedure: LOWER EXTREMITY ANGIOGRAM;  Surgeon: Serafina Mitchell, MD;  Location: [facility] CATH LAB;  Service: Cardiovascular;  Laterality: Left;  ? LOWER EXTREMITY ANGIOGRAM Left 08/17/2017  ? Procedure: LOWER EXTREMITY ANGIOGRAM LEFT LEG WITH RUNOFF AND Stenting.;  Surgeon: Serafina Mitchell, MD;  Location: [facility] OR;  Service: Vascular;  Laterality: Left;  ? MULTIPLE TOOTH EXTRACTIONS    ? OTHER SURGICAL HISTORY    ? right lower ext AKA with prothesis   ? OTHER SURGICAL HISTORY    ? left left with stents (Dr. Seward Speck)  ? OTHER SURGICAL HISTORY    ? 2003 colonoscopy 5 mm polyp transverse colon; (2)61mm  polyps in rectum-hyperplastic  ? PERIPHERAL VASCULAR INTERVENTION Left 08/17/2017  ? Procedure: POPLITEAL STENT;  Surgeon: Serafina Mitchell, MD;  Location: [facility] OR;  Service: Vascular;  Laterality: Left;  ? ? ?Family History  ?Problem Relation Age of Onset  ? Diabetes Mother   ? Coronary artery disease Mother   ? Cancer Brother   ?     colon caner stage 4 as of 11/2011 (unknown age of  onset)  ? Coronary artery disease Father   ? Prostate cancer Brother   ? Colon cancer Brother   ? Rectal cancer Neg Hx   ? ? ?  ?Social History  ? ?Socioeconomic History  ? Marital status: Single  ?  Spouse name: Not on file  ? Number of children: Not on file  ? Years of education: 21  ? Highest education level: Not on file  ?Occupational History  ? Not on file  ?Tobacco Use  ? Smoking status: Former  ? Smokeless tobacco: Former  ?  Types: Chew  ?Vaping Use  ? Vaping Use: Never used  ?Substance and Sexual Activity  ? Alcohol use: No  ?  Alcohol/week: 0.0 standard drinks  ? Drug use: No  ? Sexual activity: Yes  ?  Partners: Male  ?  Comment: declined condoms  ?Other Topics Concern  ? Not on file  ?Social History Narrative  ? Not on file  ? ?Social Determinants of Health  ? ?Financial Resource Strain: Not on file  ?Food Insecurity: Not on file   ?Transportation Needs: Not on file  ?Physical Activity: Not on file  ?Stress: Not on file  ?Social Connections: Not on file  ? ? ?Allergies  ?Allergen Reactions  ? Sulfonamide Derivatives Hives  ? ? ? ?Current Outpatient Medications:  ?  atorvastatin (LIPITOR) 40 MG tablet, TAKE 1 TABLET(40 MG) BY MOUTH DAILY, Disp: 90 tablet, Rfl: 2 ?  bictegravir-emtricitabine-tenofovir AF (BIKTARVY) 50-200-25 MG TABS tablet, Take 1 tablet by mouth daily., Disp: 30 tablet, Rfl: 11 ?  Blood Glucose Monitoring Suppl (BAYER CONTOUR MONITOR) W/DEVICE KIT, Use to check blood sugar as instructed up to 4 times a day, Disp: 1 kit, Rfl: 0 ?  Blood Glucose Monitoring Suppl (FREESTYLE FREEDOM LITE) w/Device KIT, Use 5 times daily to check blood sugar., Disp: 1 kit, Rfl: 1 ?  cilostazol (PLETAL) 50 MG tablet, Take 1 tablet (50 mg total) by mouth 2 (two) times daily., Disp: 60 tablet, Rfl: 3 ?  clopidogrel (PLAVIX) 75 MG tablet, Take 1 tablet (75 mg total) by mouth daily., Disp: 90 tablet, Rfl: 3 ?  diclofenac Sodium (VOLTAREN) 1 % GEL, Apply 4 g topically 4 (four) times daily as needed., Disp: 100 g, Rfl: 1 ?  ezetimibe (ZETIA) 10 MG tablet, Take 1 tablet (10 mg total) by mouth daily., Disp: 90 tablet, Rfl: 2 ?  glucose blood (FREESTYLE LITE) test strip, Use 5 times daily to check blood sugar., Disp: 450 each, Rfl: 3 ?  ibuprofen (ADVIL) 400 MG tablet, Take by mouth., Disp: , Rfl:  ?  injection device for insulin (CEQUR SIMPLICITY 2U) DEVI, Apply as directed once every 3 days, Disp: 10 each, Rfl: 11 ?  Injection Device for Insulin (CEQUR SIMPLICITY INSERTER) MISC, Apply as directed every 3 days, Disp: 1 each, Rfl: 1 ?  insulin glargine, 2 Unit Dial, (TOUJEO MAX SOLOSTAR) 300 UNIT/ML Solostar Pen, Inject 60 Units into the skin in the morning., Disp: 6 mL, Rfl: 5 ?  insulin lispro (HUMALOG KWIKPEN) 200 UNIT/ML KwikPen, Inject 30 Units into the skin with breakfast, with lunch, and with evening meal., Disp: 72 mL, Rfl: 3 ?  insulin lispro  (HUMALOG) 100 UNIT/ML injection, Use to fill Cecur Simplicity up to 962 units every three days to use for mealtime insulin injections three times a day (Patient taking differently: Use to fill Cecur Simplicity up to 229 units every three days to use for mealtime insulin injections three times a day. Per  DR. Evette Doffing he is to take 11-17 units ( 3-56 clicks with his 2unit Cecur Simplicity insulin patch)  three times daily with meals.), Disp: 20 mL, Rfl: 11 ?  Insulin Pen Needle (BD PEN NEEDLE NANO U/F) 32G X 4 MM MISC, USE AS DIRECTED FOUR TIMES DAILY. Dx code  E11.42, Disp: 200 each, Rfl: 5 ?  Lancet Devices (BAYER MICROLET 2 LANCING DEVIC) MISC, Use to check blood sugar up to 3 ties a day, Disp: 1 each, Rfl: 2 ?  Lancets (FREESTYLE) lancets, Use 5 times daily to check blood sugar., Disp: 450 each, Rfl: 3 ?  lisinopril (ZESTRIL) 40 MG tablet, TAKE 1 TABLET BY MOUTH DAILY. HOLD IF SYSTOLIC BLOOD PRESSURE IS LESS THAN 110, Disp: 90 tablet, Rfl: 3 ?  loperamide (IMODIUM) 2 MG capsule, Take 1 capsule (2 mg total) by mouth as needed for diarrhea or loose stools., Disp: 120 capsule, Rfl: 0 ?  omeprazole (PRILOSEC) 40 MG capsule, TAKE 1 CAPSULE(40 MG) BY MOUTH DAILY, Disp: 90 capsule, Rfl: 1 ?  pioglitazone (ACTOS) 30 MG tablet, Take 1 tablet (30 mg total) by mouth daily., Disp: 90 tablet, Rfl: 3 ?  Semaglutide,0.25 or 0.5MG/DOS, (OZEMPIC, 0.25 OR 0.5 MG/DOSE,) 2 MG/1.5ML SOPN, Inject 0.5 mg into the skin once a week., Disp: 5 mL, Rfl: 0 ? ?Review of Systems  ?Constitutional:  Negative for activity change, appetite change, chills, diaphoresis, fatigue, fever and unexpected weight change.  ?HENT:  Negative for congestion, rhinorrhea, sinus pressure, sneezing, sore throat and trouble swallowing.   ?Eyes:  Negative for photophobia and visual disturbance.  ?Respiratory:  Negative for cough, chest tightness, shortness of breath, wheezing and stridor.   ?Cardiovascular:  Negative for chest pain, palpitations and leg swelling.   ?Gastrointestinal:  Negative for abdominal distention, abdominal pain, anal bleeding, blood in stool, constipation, diarrhea, nausea and vomiting.  ?Genitourinary:  Negative for difficulty urinating, dysuria, flank pain an

## 2021-08-29 ENCOUNTER — Ambulatory Visit (INDEPENDENT_AMBULATORY_CARE_PROVIDER_SITE_OTHER): Payer: Medicare (Managed Care) | Admitting: Student

## 2021-08-29 ENCOUNTER — Encounter: Payer: Self-pay | Admitting: Student

## 2021-08-29 ENCOUNTER — Other Ambulatory Visit: Payer: Self-pay

## 2021-08-29 VITALS — BP 120/62 | HR 78 | Temp 97.5°F | Ht 73.0 in | Wt 188.0 lb

## 2021-08-29 DIAGNOSIS — Z794 Long term (current) use of insulin: Secondary | ICD-10-CM | POA: Diagnosis not present

## 2021-08-29 DIAGNOSIS — Z23 Encounter for immunization: Secondary | ICD-10-CM

## 2021-08-29 DIAGNOSIS — K219 Gastro-esophageal reflux disease without esophagitis: Secondary | ICD-10-CM

## 2021-08-29 DIAGNOSIS — Z0000 Encounter for general adult medical examination without abnormal findings: Secondary | ICD-10-CM

## 2021-08-29 DIAGNOSIS — I739 Peripheral vascular disease, unspecified: Secondary | ICD-10-CM

## 2021-08-29 DIAGNOSIS — I10 Essential (primary) hypertension: Secondary | ICD-10-CM | POA: Diagnosis not present

## 2021-08-29 DIAGNOSIS — E1151 Type 2 diabetes mellitus with diabetic peripheral angiopathy without gangrene: Secondary | ICD-10-CM | POA: Diagnosis not present

## 2021-08-29 LAB — GLUCOSE, CAPILLARY: Glucose-Capillary: 170 mg/dL — ABNORMAL HIGH (ref 70–99)

## 2021-08-29 LAB — POCT GLYCOSYLATED HEMOGLOBIN (HGB A1C): Hemoglobin A1C: 7.2 % — AB (ref 4.0–5.6)

## 2021-08-29 MED ORDER — OZEMPIC (1 MG/DOSE) 4 MG/3ML ~~LOC~~ SOPN: 1.0000 mg | PEN_INJECTOR | SUBCUTANEOUS | 2 refills | Status: DC

## 2021-08-29 MED ORDER — EMPAGLIFLOZIN 10 MG PO TABS: 10.0000 mg | ORAL_TABLET | Freq: Every day | ORAL | 2 refills | Status: DC

## 2021-08-29 MED ORDER — OMEPRAZOLE 40 MG PO CPDR: DELAYED_RELEASE_CAPSULE | ORAL | 1 refills | Status: DC

## 2021-08-29 NOTE — Assessment & Plan Note (Addendum)
Repeat A1c 7.2. ?He is currently taking 60 units of Toujeo in the morning.  Humalog 20 units 3 times daily with meals.  Report correct use of Humalog. ?He was prescribed Ozempic 0.5 mg from last visit but reportedly taking 1 mg weekly, given by pharmacy.  He reports doing well on this dose and denies any side effects.   ?Also currently taking Actos 30 units daily. ? ?We cannot generate a report from his glucometer today.  It only pulls value from 2022.  He reports taking his blood sugar 4 times daily.  Denies hypoglycemia event. ? ?-Continue Toujeo and Humalog ?-Continue Ozempic 1 mg weekly. Confirmed with pharmacy.  ?-I will switch to SGLT2 and stop Actos for better A1c and cardiovascular risk benefits.  He denies history of DKA. ?-Repeat A1c in 3 months ?

## 2021-08-29 NOTE — Assessment & Plan Note (Signed)
-   Tdap vaccine given today ?-Patient is due for colonoscopy this day.  States that they called him but he did not return the call.  Advised patient to follow-up on this. ?

## 2021-08-29 NOTE — Assessment & Plan Note (Signed)
Blood pressure very well controlled at 120/62.  Report adherence to his medication ? ?-Continue lisinopril 40 mg daily ?-Last BMP on 4/11 was unremarkable ?

## 2021-08-29 NOTE — Assessment & Plan Note (Addendum)
Currently seeing vascular for his PAD.  His last arterial duplex showed 50-75% stenosis of the left femoral artery.  Fortunately his popliteal stenting was patent.  Very weak dorsalis pedal pulse palpated on the left extremity today. ? ?-Continue atorvastatin and Zetia.  Last LDL was at goal ?-Continue Plavix ?-Follow-up with vascular as scheduled. ?

## 2021-08-29 NOTE — Patient Instructions (Signed)
Mr. Sagraves, ? ?It was nice seeing you in the clinic today. ? ?1.  Your blood pressure is very well controlled.  Please continue lisinopril 40 mg daily. ? ?2.  Your A1c is 7.2.  You are doing a great job of keeping your diabetes under control.  Please continue Toujeo and Humalog. ? ?I will call and check on the dose of the Ozempic. ? ?I will discontinue Actos and start you on a new medication called Jardiance 10 mg daily.  This medication has shown to better control diabetes and reduce risk of kidney disease and heart disease. ? ?Please return in 3 months for repeat A1c ? ?Dr. Alfonse Spruce ?

## 2021-08-29 NOTE — Progress Notes (Signed)
? ?CC: Follow-up on pretension and diabetes ? ?HPI: ? ?[name] is a 71 y.o. with past medical history of hypertension, HIV, type 2 diabetes, PAD who presents to clinic today for his A1c recheck. ? ?Please see problem based charting for detail ? ?Past Medical History:  ?Diagnosis Date  ? Asthma   ? per 2003 [facility] pulm records pfts   ? Blood dyscrasia   ? HIV  ? Clotting disorder ([place])   ? Colon polyps   ? noted previous colonoscopy [facility]  ? DDD (degenerative disc disease)   ? cervical spine  ? Depression   ? Diabetes mellitus   ? dx 2010  ? Diarrhea 02/24/2021  ? Dizziness 08/25/2021  ? Falls 08/25/2021  ? Fever   ? unknwon origin  ? GERD (gastroesophageal reflux disease)   ? Head swelling 05/23/2017  ? Hep C w/o coma, chronic (HCC)   ? History of syphilis   ? noted [facility] records  ? HIV infection ([place])   ? undetectable viral load and CD4 ct 667 as of 11/2011  ? Hyperlipidemia   ? Hypertension   ? MRSA (methicillin resistant Staphylococcus aureus)   ? Multiple sclerosis ([place])   ? in remission as of 11/2011 (diagnosed late 1980s)  ? Pain in limb-Right Leg 10/13/2013  ? 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[place])   ? 2002  ? Pneumonia   ? Prosthesis fitting 03/10/2019  ? PVD (peripheral vascular disease) ([place])   ? Left Stent 07/02/2008  ? Scalp lesion 07/26/2017  ? UTI (urinary tract infection)   ? Wears dentures   ? ?Review of Systems:  per HPI ? ?Physical Exam: ? ?Vitals:  ? 08/29/21 0945  ?BP: 120/62  ?Pulse: 78  ?Temp: (!) 97.5 ?F (36.4 ?C)  ?TempSrc: Oral  ?SpO2: 97%  ?Weight: 188 lb (85.3 kg)  ?Height: '6\' 1"'$  (1.854 m)  ? ?Physical Exam ?Constitutional:   ?   General: He is not in acute distress. ?   Appearance: He is not ill-appearing.  ?HENT:  ?   Head: Normocephalic.  ?Eyes:  ?   General:     ?   Right eye: No discharge.     ?   Left eye: No discharge.  ?   Conjunctiva/sclera: Conjunctivae normal.  ?Cardiovascular:  ?   Rate and Rhythm: Normal rate and regular rhythm.  ?Pulmonary:  ?   Effort: Pulmonary effort  is normal. No respiratory distress.  ?   Breath sounds: Normal breath sounds.  ?Musculoskeletal:  ?   Comments: Right AKA ?Left lower extremity warm to touch.  Very weak dorsalis pedis pulse palpated.  ?Skin: ?   General: Skin is warm.  ?Neurological:  ?   General: No focal deficit present.  ?   Mental Status: He is alert.  ?Psychiatric:     ?   Mood and Affect: Mood normal.  ?  ? ?Assessment & Plan:  ? ?See Encounters Tab for problem based charting. ? ?Benign essential HTN ?Blood pressure very well controlled at 120/62.  Report adherence to his medication ? ?-Continue lisinopril 40 mg daily ?-Last BMP on 4/11 was unremarkable ? ?Type 2 DM with diabetic peripheral angiopathy w/o gangrene (HCC) ?Repeat A1c 7.2. ?He is currently taking 60 units of Toujeo in the morning.  Humalog 20 units 3 times daily with meals.  Report correct use of Humalog. ?He was prescribed Ozempic 0.5 mg from last visit but reportedly taking 1 mg weekly, given by pharmacy.  He reports doing  well on this dose and denies any side effects.  We will check on this. ?Also currently taking Actos 30 units daily. ? ?We cannot generate a report from his glucometer today.  It only pulls value from 2022.  He reports taking his blood sugar 4 times daily.  Denies hypoglycemia event. ? ?-Continue Toujeo and Humalog ?-Will check on Ozempic doses. ?-I will switch to SGLT2 and stop Actos for better A1c and cardiovascular risk benefits.  He denies history of DKA. ?-Repeat A1c in 3 months ? ?Healthcare maintenance ?- Tdap vaccine given today ?-Patient is due for colonoscopy this day.  States that they called him but he did not return the call.  Advised patient to follow-up on this. ? ?PERIPHERAL VASCULAR DISEASE ?Currently seeing vascular for his PAD.  His last arterial duplex showed 50-75% stenosis of the left femoral artery.  Fortunately his popliteal stenting was patent.  Very weak dorsalis pedal pulse palpated on the left extremity today. ? ?-Continue  atorvastatin and Zetia.  Last LDL was at goal ?-Continue Plavix ?-Follow-up with vascular as scheduled.  ? ?Patient discussed with Dr. Dareen Piano  ?

## 2021-08-31 NOTE — Progress Notes (Signed)
Internal Medicine Clinic Attending  Case discussed with Dr. Nguyen  At the time of the visit.  We reviewed the resident's history and exam and pertinent patient test results.  I agree with the assessment, diagnosis, and plan of care documented in the resident's note. 

## 2021-09-03 MED ORDER — CILOSTAZOL 50 MG PO TABS: 50.0000 mg | ORAL_TABLET | Freq: Two times a day (BID) | ORAL | 3 refills | Status: DC

## 2021-09-09 ENCOUNTER — Ambulatory Visit (INDEPENDENT_AMBULATORY_CARE_PROVIDER_SITE_OTHER): Payer: Medicare (Managed Care) | Admitting: Internal Medicine

## 2021-09-09 ENCOUNTER — Encounter: Payer: Self-pay | Admitting: Internal Medicine

## 2021-09-09 VITALS — BP 126/70 | HR 76 | Ht 73.0 in | Wt 182.6 lb

## 2021-09-09 DIAGNOSIS — E1142 Type 2 diabetes mellitus with diabetic polyneuropathy: Secondary | ICD-10-CM | POA: Diagnosis not present

## 2021-09-09 DIAGNOSIS — Z794 Long term (current) use of insulin: Secondary | ICD-10-CM | POA: Diagnosis not present

## 2021-09-09 DIAGNOSIS — E1169 Type 2 diabetes mellitus with other specified complication: Secondary | ICD-10-CM

## 2021-09-09 DIAGNOSIS — E1151 Type 2 diabetes mellitus with diabetic peripheral angiopathy without gangrene: Secondary | ICD-10-CM

## 2021-09-09 DIAGNOSIS — E785 Hyperlipidemia, unspecified: Secondary | ICD-10-CM | POA: Diagnosis not present

## 2021-09-09 MED ORDER — CEQUR SIMPLICITY INSERTER MISC: 1 refills | Status: DC

## 2021-09-09 MED ORDER — CEQUR SIMPLICITY 2U DEVI: 11 refills | Status: DC

## 2021-09-09 MED ORDER — EMPAGLIFLOZIN 25 MG PO TABS: 25.0000 mg | ORAL_TABLET | Freq: Every day | ORAL | 3 refills | Status: DC

## 2021-09-09 MED ORDER — TOUJEO MAX SOLOSTAR 300 UNIT/ML ~~LOC~~ SOPN: 56.0000 [IU] | PEN_INJECTOR | Freq: Every morning | SUBCUTANEOUS | 3 refills | Status: DC

## 2021-09-09 MED ORDER — INSULIN LISPRO 100 UNIT/ML IJ SOLN: INTRAMUSCULAR | 3 refills | Status: DC

## 2021-09-09 MED ORDER — OZEMPIC (1 MG/DOSE) 4 MG/3ML ~~LOC~~ SOPN: 1.0000 mg | PEN_INJECTOR | SUBCUTANEOUS | 2 refills | Status: DC

## 2021-09-09 NOTE — Patient Instructions (Addendum)
Increase  Jardiance 25 mg, 1 tablet every morning  daily  ?Decrease Toujeo 56  units every morning  ?HUmalog 10 clicks with each meal  ?Humalog 1 click with snacks ?Continue Ozempic 1 mg weekly ( Tuesdays)  ? ? ? ?HOW TO TREAT LOW BLOOD SUGARS (Blood sugar LESS THAN 70 MG/DL) ?Please follow the RULE OF 15 for the treatment of hypoglycemia treatment (when your (blood sugars are less than 70 mg/dL)  ? ?STEP 1: Take 15 grams of carbohydrates when your blood sugar is low, which includes:  ?3-4 GLUCOSE TABS  OR ?3-4 OZ OF JUICE OR REGULAR SODA OR ?ONE TUBE OF GLUCOSE GEL   ? ?STEP 2: RECHECK blood sugar in 15 MINUTES ?STEP 3: If your blood sugar is still low at the 15 minute recheck --> then, go back to STEP 1 and treat AGAIN with another 15 grams of carbohydrates. ? ? ?

## 2021-09-09 NOTE — Progress Notes (Signed)
?Name: Marlou Starks Bonnie  ?MRN/ DOB: [id number], 1950-05-31   ?Age/ Sex: 71 y.o., male   ? ?PCP: Delene Ruffini, MD   ?Reason for Endocrinology Evaluation: Type 2 Diabetes Mellitus  ?   ?Date of Initial Endocrinology Visit: 09/09/2021   ? ? ?PATIENT IDENTIFIER: Mr. WILIAM CAUTHORN is a 71 y.o. male with a past medical history of T2DM, HTN, GERD and Dyslipidemia. The patient presented for initial endocrinology clinic visit on 09/09/2021 for consultative assistance with his diabetes management.  ? ? ?HPI: ?Mr. Novosel was  ? ? ?Diagnosed with DM 2010 ?Prior Medications tried/Intolerance: Jardiance started 08/2021. Has metformin intolerance . Started Ozempic 2022 ?Currently checking blood sugars 2 x / day  ?Hypoglycemia episodes : no                ?Hemoglobin A1c has ranged from 5.6% in 2019, peaking at 10.5% in 2022. ?Patient required assistance for hypoglycemia: no  ?Patient has required hospitalization within the last 1 year from hyper or hypoglycemia: no ? ?In terms of diet, the patient eats 3 meals a day and snacks, avoids sugar sweetened beverages  ? ?Denies dizziness or lightheadedness ?Denies UTI's  ?Denies nausea or vomiting  ?Has occasional diarrhea which is attributes to Ozempic  ? ?Unable to tolerate CGM as it kept falling off and does not use cellphone  ?No prior DKA  ? ?Has GERD  ? ?HOME DIABETES REGIMEN: ?Jardiance 10 mg daily  ?CeQur Q 3 days  ?Toujeo 60  units daily  ?HUmalog 20 units with meal   ?Ozempic 1 mg weekly ( Tuesdays)  ? ? ? ? ?Statin: yes ?ACE-I/ARB: yes ? ? ? ?METER DOWNLOAD SUMMARY:unable to download ?14 day average 168  ? ?79- 255 ? ?DIABETIC COMPLICATIONS: ?Microvascular complications:  ?S/P right Above knee amputations, neuropathy ?Denies: CKD , retinopathy ?Last eye exam: Completed 2022 ? ?Macrovascular complications:  ?PVD ?Denies: CAD, CVA ? ? ?PAST HISTORY: ?Past Medical History:  ?Past Medical History:  ?Diagnosis Date  ? Asthma   ? per 2003 [facility] pulm records pfts   ? Blood dyscrasia    ? HIV  ? Clotting disorder ([place])   ? Colon polyps   ? noted previous colonoscopy [facility]  ? DDD (degenerative disc disease)   ? cervical spine  ? Depression   ? Diabetes mellitus   ? dx 2010  ? Diarrhea 02/24/2021  ? Dizziness 08/25/2021  ? Falls 08/25/2021  ? Fever   ? unknwon origin  ? GERD (gastroesophageal reflux disease)   ? Head swelling 05/23/2017  ? Hep C w/o coma, chronic (HCC)   ? History of syphilis   ? noted [facility] records  ? HIV infection ([place])   ? undetectable viral load and CD4 ct 667 as of 11/2011  ? Hyperlipidemia   ? Hypertension   ? MRSA (methicillin resistant Staphylococcus aureus)   ? Multiple sclerosis ([place])   ? in remission as of 11/2011 (diagnosed late 1980s)  ? Pain in limb-Right Leg 10/13/2013  ? 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[place])   ? 2002  ? Pneumonia   ? Prosthesis fitting 03/10/2019  ? PVD (peripheral vascular disease) ([place])   ? Left Stent 07/02/2008  ? Scalp lesion 07/26/2017  ? UTI (urinary tract infection)   ? Wears dentures   ? ?Past Surgical History:  ?Past Surgical History:  ?Procedure Laterality Date  ? ABDOMINAL AORTOGRAM W/LOWER EXTREMITY N/A 08/07/2017  ? Procedure: ABDOMINAL AORTOGRAM W/LOWER EXTREMITY;  Surgeon: Serafina Mitchell, MD;  Location: [facility]  INVASIVE CV LAB;  Service: Cardiovascular;  Laterality: N/A;  ? ABOVE KNEE LEG AMPUTATION    ? Right Leg  ? COLONOSCOPY W/ BIOPSIES AND POLYPECTOMY    ? LOWER EXTREMITY ANGIOGRAM Left 12/26/2011  ? Procedure: LOWER EXTREMITY ANGIOGRAM;  Surgeon: Serafina Mitchell, MD;  Location: [facility] CATH LAB;  Service: Cardiovascular;  Laterality: Left;  ? LOWER EXTREMITY ANGIOGRAM Left 08/17/2017  ? Procedure: LOWER EXTREMITY ANGIOGRAM LEFT LEG WITH RUNOFF AND Stenting.;  Surgeon: Serafina Mitchell, MD;  Location: [facility] OR;  Service: Vascular;  Laterality: Left;  ? MULTIPLE TOOTH EXTRACTIONS    ? OTHER SURGICAL HISTORY    ? right lower ext AKA with prothesis   ? OTHER SURGICAL HISTORY    ? left left with stents (Dr. Seward Speck)  ? OTHER SURGICAL HISTORY     ? 2003 colonoscopy 5 mm polyp transverse colon; (2)68m  polyps in rectum-hyperplastic  ? PERIPHERAL VASCULAR INTERVENTION Left 08/17/2017  ? Procedure: POPLITEAL STENT;  Surgeon: BSerafina Mitchell MD;  [facility];  Service: Vascular;  Laterality: Left;  ?  ?Social History:  reports that he has quit smoking. He has quit using smokeless tobacco.  His smokeless tobacco use included chew. He reports that he does not drink alcohol and does not use drugs. ?Family History:  ?Family History  ?Problem Relation Age of Onset  ? Diabetes Mother   ? Coronary artery disease Mother   ? Cancer Brother   ?     colon caner stage 4 as of 11/2011 (unknown age of onset)  ? Coronary artery disease Father   ? Prostate cancer Brother   ? Colon cancer Brother   ? Rectal cancer Neg Hx   ? ? ? ?HOME MEDICATIONS: ?Allergies as of 09/09/2021   ? ?   Reactions  ? Sulfonamide Derivatives Hives  ? ?  ? ?  ?Medication List  ?  ? ?  ? Accurate as of Sep 09, 2021  7:16 AM. If you have any questions, ask your nurse or doctor.  ?  ?  ? ?  ? ?atorvastatin 40 MG tablet ?Commonly known as: LIPITOR ?TAKE 1 TABLET(40 MG) BY MOUTH DAILY ?  ?BLandscape architectw/Device Kit ?Use to check blood sugar as instructed up to 4 times a day ?  ?FreeStyle Freedom Lite w/Device Kit ?Use 5 times daily to check blood sugar. ?  ?BIndustrial/product designer2 Lancing Devic Misc ?Use to check blood sugar up to 3 ties a day ?  ?BD Pen Needle Nano U/F 32G X 4 MM Misc ?Generic drug: Insulin Pen Needle ?USE AS DIRECTED FOUR TIMES DAILY. Dx code  E11.42 ?  ?Biktarvy 50-200-25 MG Tabs tablet ?Generic drug: bictegravir-emtricitabine-tenofovir AF ?Take 1 tablet by mouth daily. ?  ?CYulee?Generic drug: injection device for insulin ?Apply as directed once every 3 days ?  ?[place]?Apply as directed every 3 days ?  ?cilostazol 50 MG tablet ?Commonly known as: PLETAL ?Take 1 tablet (50 mg total) by mouth 2 (two) times daily. ?  ?clopidogrel 75 MG  tablet ?Commonly known as: PLAVIX ?Take 1 tablet (75 mg total) by mouth daily. ?  ?diclofenac Sodium 1 % Gel ?Commonly known as: Voltaren ?Apply 4 g topically 4 (four) times daily as needed. ?  ?empagliflozin 10 MG Tabs tablet ?Commonly known as: Jardiance ?Take 1 tablet (10 mg total) by mouth daily before breakfast. ?  ?ezetimibe 10 MG tablet ?Commonly known as: Zetia ?Take 1 tablet (  10 mg total) by mouth daily. ?  ?freestyle lancets ?Use 5 times daily to check blood sugar. ?  ?FREESTYLE LITE test strip ?Generic drug: glucose blood ?Use 5 times daily to check blood sugar. ?  ?HumaLOG KwikPen 200 UNIT/ML KwikPen ?Generic drug: insulin lispro ?Inject 30 Units into the skin with breakfast, with lunch, and with evening meal. ?What changed: Another medication with the same name was changed. Make sure you understand how and when to take each. ?  ?insulin lispro 100 UNIT/ML injection ?Commonly known as: HumaLOG ?Use to fill Cecur Simplicity up to 846 units every three days to use for mealtime insulin injections three times a day ?What changed: additional instructions ?  ?ibuprofen 400 MG tablet ?Commonly known as: ADVIL ?Take by mouth. ?  ?lisinopril 40 MG tablet ?Commonly known as: ZESTRIL ?TAKE 1 TABLET BY MOUTH DAILY. HOLD IF SYSTOLIC BLOOD PRESSURE IS LESS THAN 110 ?  ?loperamide 2 MG capsule ?Commonly known as: IMODIUM ?Take 1 capsule (2 mg total) by mouth as needed for diarrhea or loose stools. ?  ?omeprazole 40 MG capsule ?Commonly known as: PRILOSEC ?TAKE 1 CAPSULE(40 MG) BY MOUTH DAILY ?  ?Ozempic (1 MG/DOSE) 4 MG/3ML Sopn ?Generic drug: Semaglutide (1 MG/DOSE) ?Inject 1 mg into the skin once a week. ?  ?Toujeo Max SoloStar 300 UNIT/ML Solostar Pen ?Generic drug: insulin glargine (2 Unit Dial) ?Inject 60 Units into the skin in the morning. ?  ? ?  ? ? ? ?ALLERGIES: ?Allergies  ?Allergen Reactions  ? Sulfonamide Derivatives Hives  ? ? ? ?REVIEW OF SYSTEMS: ?A comprehensive ROS was conducted with the patient and is  negative except as per HPI  ?  ?OBJECTIVE:  ? ?VITAL SIGNS: BP 126/70 (BP Location: Left Arm, Patient Position: Sitting, Cuff Size: Small)   Pulse 76   Ht _0  (1.854 m)   Wt 182 lb 9.6 oz (82.8 kg)   SpO2

## 2021-09-12 DIAGNOSIS — Z794 Long term (current) use of insulin: Secondary | ICD-10-CM | POA: Insufficient documentation

## 2021-09-12 DIAGNOSIS — E785 Hyperlipidemia, unspecified: Secondary | ICD-10-CM | POA: Insufficient documentation

## 2021-09-12 DIAGNOSIS — E119 Type 2 diabetes mellitus without complications: Secondary | ICD-10-CM | POA: Insufficient documentation

## 2021-09-12 DIAGNOSIS — E1142 Type 2 diabetes mellitus with diabetic polyneuropathy: Secondary | ICD-10-CM | POA: Insufficient documentation

## 2021-10-08 ENCOUNTER — Other Ambulatory Visit: Payer: Self-pay

## 2021-10-08 DIAGNOSIS — I739 Peripheral vascular disease, unspecified: Secondary | ICD-10-CM

## 2021-10-08 DIAGNOSIS — I6523 Occlusion and stenosis of bilateral carotid arteries: Secondary | ICD-10-CM

## 2021-10-12 ENCOUNTER — Other Ambulatory Visit: Payer: Self-pay | Admitting: *Deleted

## 2021-10-12 DIAGNOSIS — E1169 Type 2 diabetes mellitus with other specified complication: Secondary | ICD-10-CM

## 2021-10-12 MED ORDER — EZETIMIBE 10 MG PO TABS: 10.0000 mg | ORAL_TABLET | Freq: Every day | ORAL | 2 refills | Status: DC

## 2021-10-17 ENCOUNTER — Ambulatory Visit (INDEPENDENT_AMBULATORY_CARE_PROVIDER_SITE_OTHER): Payer: Medicare (Managed Care) | Admitting: Surgery

## 2021-10-17 ENCOUNTER — Encounter: Payer: Self-pay | Admitting: Surgery

## 2021-10-17 ENCOUNTER — Ambulatory Visit (INDEPENDENT_AMBULATORY_CARE_PROVIDER_SITE_OTHER)
Admission: RE | Admit: 2021-10-17 | Discharge: 2021-10-17 | Disposition: A | Discharge status: Home / Self Care | Payer: Medicare (Managed Care) | Source: Ambulatory Visit | Attending: Surgery | Admitting: Surgery

## 2021-10-17 ENCOUNTER — Ambulatory Visit (HOSPITAL_COMMUNITY)
Admission: RE | Admit: 2021-10-17 | Discharge: 2021-10-17 | Disposition: A | Discharge status: Home / Self Care | Payer: Medicare (Managed Care) | Source: Ambulatory Visit | Attending: Surgery | Admitting: Surgery

## 2021-10-17 VITALS — BP 147/68 | HR 62 | Temp 97.8°F | Resp 20 | Ht 73.0 in | Wt 181.0 lb

## 2021-10-17 DIAGNOSIS — I70213 Atherosclerosis of native arteries of extremities with intermittent claudication, bilateral legs: Secondary | ICD-10-CM | POA: Diagnosis not present

## 2021-10-17 DIAGNOSIS — I739 Peripheral vascular disease, unspecified: Secondary | ICD-10-CM

## 2021-10-17 DIAGNOSIS — I6523 Occlusion and stenosis of bilateral carotid arteries: Secondary | ICD-10-CM | POA: Insufficient documentation

## 2021-10-17 NOTE — Progress Notes (Signed)
[facility]ascular [facility] Specialist of [facility]  Patient name: Nathan [name] MRN: [id number] DOB: Oct 03, 1950 Sex: male   REASON FOR VISIT:    Follow up  HISOTRY OF PRESENT ILLNESS:    Nathan [name] is a 71 y.o. male who is back today for follow-up.  He initially presented with severe right leg pain in 2008.  He underwent a right femoral to below-knee popliteal artery bypass graft with saphenous vein on 02/26/2007.  This occluded and on 11/12/2007 he had a redo bypass with Gore-Tex.  He ultimately ended up with a right above-knee amputation.  In 2010 he began having left leg claudication and underwent popliteal artery stenting.  This was repeated on 08/17/2017   He is having some mild leg cramping but thinks this may be attributed to the heat.  He does not have any open wounds.  He continues to work on diabetes management.  He continues to take a statin.     PAST MEDICAL HISTORY:   Past Medical History:  Diagnosis Date   Asthma    per 2003 [facility] pulm records pfts    Blood dyscrasia    HIV   Clotting disorder ([place])    Colon polyps    noted previous colonoscopy [facility]   DDD (degenerative disc disease)    cervical spine   Depression    Diabetes mellitus    dx 2010   Diarrhea 02/24/2021   Dizziness 08/25/2021   Falls 08/25/2021   Fever    unknwon origin   GERD (gastroesophageal reflux disease)    Head swelling 05/23/2017   Hep C w/o coma, chronic (HCC)    History of syphilis    noted [facility] records   HIV infection ([place])    undetectable viral load and CD4 ct 667 as of 11/2011   Hyperlipidemia    Hypertension    MRSA (methicillin resistant Staphylococcus aureus)    Multiple sclerosis ([place])    in remission as of 11/2011 (diagnosed late 1980s)   Pain in limb-Right Leg 10/13/2013   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[place])    2002   Pneumonia    Prosthesis fitting 03/10/2019   PVD (peripheral vascular disease) ([place])    Left Stent 07/02/2008    Scalp lesion 07/26/2017   UTI (urinary tract infection)    Wears dentures      FAMILY HISTORY:   Family History  Problem Relation Age of Onset   Diabetes Mother    Coronary artery disease Mother    Cancer Brother        colon caner stage 4 as of 11/2011 (unknown age of onset)   Coronary artery disease Father    Prostate cancer Brother    Colon cancer Brother    Rectal cancer Neg Hx     SOCIAL HISTORY:   Social History   Tobacco Use   Smoking status: Former   Smokeless tobacco: Former    Types: Chew  Substance Use Topics   Alcohol use: No    Alcohol/week: 0.0 standard drinks of alcohol     ALLERGIES:   Allergies  Allergen Reactions   Sulfonamide Derivatives Hives     CURRENT MEDICATIONS:   Current Outpatient Medications  Medication Sig Dispense Refill   atorvastatin (LIPITOR) 40 MG tablet TAKE 1 TABLET(40 MG) BY MOUTH DAILY 90 tablet 2   bictegravir-emtricitabine-tenofovir AF (BIKTARVY) 50-200-25 MG TABS tablet Take 1 tablet by mouth daily. 30 tablet 11   Blood Glucose Monitoring Suppl (BAYER CONTOUR MONITOR) W/DEVICE KIT  Use to check blood sugar as instructed up to 4 times a day 1 kit 0   Blood Glucose Monitoring Suppl (FREESTYLE FREEDOM LITE) w/Device KIT Use 5 times daily to check blood sugar. 1 kit 1   cilostazol (PLETAL) 50 MG tablet Take 1 tablet (50 mg total) by mouth 2 (two) times daily. 60 tablet 3   clopidogrel (PLAVIX) 75 MG tablet Take 1 tablet (75 mg total) by mouth daily. 90 tablet 3   diclofenac Sodium (VOLTAREN) 1 % GEL Apply 4 g topically 4 (four) times daily as needed. 100 g 1   empagliflozin (JARDIANCE) 25 MG TABS tablet Take 1 tablet (25 mg total) by mouth daily before breakfast. 90 tablet 3   ezetimibe (ZETIA) 10 MG tablet Take 1 tablet (10 mg total) by mouth daily. 90 tablet 2   glucose blood (FREESTYLE LITE) test strip Use 5 times daily to check blood sugar. 450 each 3   ibuprofen (ADVIL) 400 MG tablet Take by mouth.     injection device  for insulin (CEQUR SIMPLICITY 2U) DEVI Apply as directed once every 3 days 10 each 11   Injection Device for Insulin (CEQUR SIMPLICITY INSERTER) MISC Apply as directed every 3 days 1 each 1   insulin glargine, 2 Unit Dial, (TOUJEO MAX SOLOSTAR) 300 UNIT/ML Solostar Pen Inject 56 Units into the skin in the morning. 15 mL 3   insulin lispro (HUMALOG) 100 UNIT/ML injection Use to fill Cecur Simplicity up to 423 units every three days to use for mealtime insulin injections three times a day 60 mL 3   Insulin Pen Needle (BD PEN NEEDLE NANO U/F) 32G X 4 MM MISC USE AS DIRECTED FOUR TIMES DAILY. Dx code  E11.42 200 each 5   Lancet Devices (BAYER MICROLET 2 LANCING DEVIC) MISC Use to check blood sugar up to 3 ties a day 1 each 2   Lancets (FREESTYLE) lancets Use 5 times daily to check blood sugar. 450 each 3   lisinopril (ZESTRIL) 40 MG tablet TAKE 1 TABLET BY MOUTH DAILY. HOLD IF SYSTOLIC BLOOD PRESSURE IS LESS THAN 110 90 tablet 3   loperamide (IMODIUM) 2 MG capsule Take 1 capsule (2 mg total) by mouth as needed for diarrhea or loose stools. 120 capsule 0   omeprazole (PRILOSEC) 40 MG capsule TAKE 1 CAPSULE(40 MG) BY MOUTH DAILY 90 capsule 1   Semaglutide, 1 MG/DOSE, (OZEMPIC, 1 MG/DOSE,) 4 MG/3ML SOPN Inject 1 mg into the skin once a week. 9 mL 2   No current facility-administered medications for this visit.    REVIEW OF SYSTEMS:   '[X]'  denotes positive finding, '[ ]'  denotes negative finding Cardiac  Comments:  Chest pain or chest pressure:    Shortness of breath upon exertion:    Short of breath when lying flat:    Irregular heart rhythm:        Vascular    Pain in calf, thigh, or hip brought on by ambulation:    Pain in feet at night that wakes you up from your sleep:     Blood clot in your veins:    Leg swelling:         Pulmonary    Oxygen at home:    Productive cough:     Wheezing:         Neurologic    Sudden weakness in arms or legs:     Sudden numbness in arms or legs:      Sudden onset of difficulty speaking  or slurred speech:    Temporary loss of vision in one eye:     Problems with dizziness:         Gastrointestinal    Blood in stool:     Vomited blood:         Genitourinary    Burning when urinating:     Blood in urine:        Psychiatric    Major depression:         Hematologic    Bleeding problems:    Problems with blood clotting too easily:        Skin    Rashes or ulcers:        Constitutional    Fever or chills:      PHYSICAL EXAM:   Vitals:   10/17/21 1217 10/17/21 1219  BP: (!) 149/76 (!) 147/68  Pulse: 62   Resp: 20   Temp: 97.8 F (36.6 C)   SpO2: 92%   Weight: 181 lb (82.1 kg)   Height: '6\' 1"'  (1.854 m)     GENERAL: The patient is a well-nourished male, in no acute distress. The vital signs are documented above. CARDIAC: There is a regular rate and rhythm.  VASCULAR: non-palpable pedal pulses PULMONARY: Non-labored respirations MUSCULOSKELETAL: There are no major deformities or cyanosis. NEUROLOGIC: No focal weakness or paresthesias are detected. SKIN: There are no ulcers or rashes noted. PSYCHIATRIC: The patient has a normal affect.  STUDIES:   I have reviewed the following:  ABI/TBIToday's ABIToday's TBIPrevious ABIPrevious TBI  +-------+-----------+-----------+------------+------------+  Right  AKA                   AKA                       +-------+-----------+-----------+------------+------------+  Left   0.89       0.55       0.74        0.23          +-------+-----------+-----------+------------+------------+  Right toe pressure = 83  +-----------+--------+-----+---------------+----------+--------+  LEFT       PSV cm/sRatioStenosis       Waveform  Comments  +-----------+--------+-----+---------------+----------+--------+  CFA Prox   120                         triphasic           +-----------+--------+-----+---------------+----------+--------+  CFA Distal 102                          triphasic           +-----------+--------+-----+---------------+----------+--------+  DFA        74                          biphasic            +-----------+--------+-----+---------------+----------+--------+  SFA Prox   105                         triphasic           +-----------+--------+-----+---------------+----------+--------+  SFA Mid    258          50-74% stenosismonophasicbrisk     +-----------+--------+-----+---------------+----------+--------+  SFA Distal 247          50-74% stenosisbiphasic            +-----------+--------+-----+---------------+----------+--------+  ATA Distal 25                          monophasic          +-----------+--------+-----+---------------+----------+--------+  PTA Distal                                       NV        +-----------+--------+-----+---------------+----------+--------+  PERO Distal110                         monophasicbrisk     +-----------+--------+-----+---------------+----------+--------+    Right Carotid: Velocities in the right ICA are consistent with a 60-79%                 stenosis. The ECA appears >50% stenosed.   Left Carotid: Velocities in the left ICA are consistent with a 60-79%  stenosis.                The ECA appears >50% stenosed.   Vertebrals:  Bilateral vertebral arteries demonstrate antegrade flow.  Subclavians: Right subclavian artery was stenotic. Normal flow  hemodynamics were               seen in the left subclavian artery.    MEDICAL ISSUES:   PAD: The patient's ABIs remained stable.  He does have mid SFA stenosis which remained stable.  He does not have any clinical symptoms.  He will follow-up in 6 months with repeat duplex   Carotid: He remains asymptomatic with slight progression in his disease now 60 to 79% bilateral.  I will have him follow-up in 6 months with repeat imaging    Leia Alf, MD, FACS [facility] [phone] Pager [phone]

## 2021-10-18 ENCOUNTER — Other Ambulatory Visit: Payer: Self-pay | Admitting: *Deleted

## 2021-10-18 DIAGNOSIS — E7800 Pure hypercholesterolemia, unspecified: Secondary | ICD-10-CM

## 2021-10-18 DIAGNOSIS — E1169 Type 2 diabetes mellitus with other specified complication: Secondary | ICD-10-CM

## 2021-10-18 MED ORDER — ATORVASTATIN CALCIUM 40 MG PO TABS: ORAL_TABLET | ORAL | 2 refills | Status: DC

## 2021-10-20 ENCOUNTER — Other Ambulatory Visit: Payer: Self-pay | Admitting: *Deleted

## 2021-10-20 DIAGNOSIS — I739 Peripheral vascular disease, unspecified: Secondary | ICD-10-CM

## 2021-10-20 DIAGNOSIS — I6523 Occlusion and stenosis of bilateral carotid arteries: Secondary | ICD-10-CM

## 2021-10-20 DIAGNOSIS — I70213 Atherosclerosis of native arteries of extremities with intermittent claudication, bilateral legs: Secondary | ICD-10-CM

## 2021-11-16 ENCOUNTER — Other Ambulatory Visit: Payer: Self-pay

## 2021-11-16 DIAGNOSIS — I10 Essential (primary) hypertension: Secondary | ICD-10-CM

## 2021-11-20 MED ORDER — LISINOPRIL 40 MG PO TABS: ORAL_TABLET | ORAL | 3 refills | Status: DC

## 2021-11-22 ENCOUNTER — Other Ambulatory Visit: Payer: Medicare (Managed Care)

## 2021-11-22 ENCOUNTER — Other Ambulatory Visit: Payer: Self-pay

## 2021-11-22 ENCOUNTER — Other Ambulatory Visit (HOSPITAL_COMMUNITY)
Admission: RE | Admit: 2021-11-22 | Discharge: 2021-11-22 | Disposition: A | Discharge status: Home / Self Care | Payer: Medicare (Managed Care) | Source: Ambulatory Visit | Attending: Infectious Disease | Admitting: Infectious Disease

## 2021-11-22 DIAGNOSIS — Z113 Encounter for screening for infections with a predominantly sexual mode of transmission: Secondary | ICD-10-CM | POA: Insufficient documentation

## 2021-11-22 DIAGNOSIS — B20 Human immunodeficiency virus [HIV] disease: Secondary | ICD-10-CM

## 2021-11-22 DIAGNOSIS — Z79899 Other long term (current) drug therapy: Secondary | ICD-10-CM | POA: Diagnosis not present

## 2021-11-23 LAB — T-HELPER CELL (CD4) - (RCID CLINIC ONLY)
CD4 % Helper T Cell: 18 % — ABNORMAL LOW (ref 33–65)
CD4 T Cell Abs: 529 /uL (ref 400–1790)

## 2021-11-23 LAB — URINE CYTOLOGY ANCILLARY ONLY
Chlamydia: NEGATIVE
Comment: NEGATIVE
Comment: NORMAL
Neisseria Gonorrhea: NEGATIVE

## 2021-11-25 ENCOUNTER — Encounter: Payer: Self-pay | Admitting: Infectious Disease

## 2021-11-25 LAB — CBC WITH DIFFERENTIAL/PLATELET
Absolute Monocytes: 496 cells/uL (ref 200–950)
Basophils Absolute: 30 cells/uL (ref 0–200)
Basophils Relative: 0.5 %
Eosinophils Absolute: 212 cells/uL (ref 15–500)
Eosinophils Relative: 3.6 %
HCT: 44.4 % (ref 38.5–50.0)
Hemoglobin: 15.5 g/dL (ref 13.2–17.1)
Lymphs Abs: 3027 cells/uL (ref 850–3900)
MCH: 31.5 pg (ref 27.0–33.0)
MCHC: 34.9 g/dL (ref 32.0–36.0)
MCV: 90.2 fL (ref 80.0–100.0)
MPV: 9 fL (ref 7.5–12.5)
Monocytes Relative: 8.4 %
Neutro Abs: 2136 cells/uL (ref 1500–7800)
Neutrophils Relative %: 36.2 %
Platelets: 208 10*3/uL (ref 140–400)
RBC: 4.92 10*6/uL (ref 4.20–5.80)
RDW: 12.5 % (ref 11.0–15.0)
Total Lymphocyte: 51.3 %
WBC: 5.9 10*3/uL (ref 3.8–10.8)

## 2021-11-25 LAB — COMPLETE METABOLIC PANEL WITH GFR
AG Ratio: 1.8 (calc) (ref 1.0–2.5)
ALT: 27 U/L (ref 9–46)
AST: 31 U/L (ref 10–35)
Albumin: 4.6 g/dL (ref 3.6–5.1)
Alkaline phosphatase (APISO): 59 U/L (ref 35–144)
BUN: 10 mg/dL (ref 7–25)
CO2: 25 mmol/L (ref 20–32)
Calcium: 9.7 mg/dL (ref 8.6–10.3)
Chloride: 107 mmol/L (ref 98–110)
Creat: 1.09 mg/dL (ref 0.70–1.28)
Globulin: 2.6 g/dL (calc) (ref 1.9–3.7)
Glucose, Bld: 119 mg/dL — ABNORMAL HIGH (ref 65–99)
Potassium: 4 mmol/L (ref 3.5–5.3)
Sodium: 141 mmol/L (ref 135–146)
Total Bilirubin: 0.6 mg/dL (ref 0.2–1.2)
Total Protein: 7.2 g/dL (ref 6.1–8.1)
eGFR: 72 mL/min/{1.73_m2} (ref >=60)

## 2021-11-25 LAB — HIV-1 RNA QUANT-NO REFLEX-BLD
HIV 1 RNA Quant: NOT DETECTED Copies/mL
HIV-1 RNA Quant, Log: NOT DETECTED Log cps/mL

## 2021-12-06 ENCOUNTER — Ambulatory Visit (INDEPENDENT_AMBULATORY_CARE_PROVIDER_SITE_OTHER): Payer: Medicare (Managed Care) | Admitting: Infectious Disease

## 2021-12-06 ENCOUNTER — Encounter: Payer: Self-pay | Admitting: Infectious Disease

## 2021-12-06 ENCOUNTER — Other Ambulatory Visit: Payer: Self-pay

## 2021-12-06 VITALS — BP 145/71 | HR 69 | Temp 98.2°F | Wt 184.0 lb

## 2021-12-06 DIAGNOSIS — I70213 Atherosclerosis of native arteries of extremities with intermittent claudication, bilateral legs: Secondary | ICD-10-CM

## 2021-12-06 DIAGNOSIS — E1142 Type 2 diabetes mellitus with diabetic polyneuropathy: Secondary | ICD-10-CM | POA: Diagnosis not present

## 2021-12-06 DIAGNOSIS — E1169 Type 2 diabetes mellitus with other specified complication: Secondary | ICD-10-CM | POA: Diagnosis not present

## 2021-12-06 DIAGNOSIS — Z794 Long term (current) use of insulin: Secondary | ICD-10-CM

## 2021-12-06 DIAGNOSIS — B20 Human immunodeficiency virus [HIV] disease: Secondary | ICD-10-CM | POA: Diagnosis not present

## 2021-12-06 DIAGNOSIS — E785 Hyperlipidemia, unspecified: Secondary | ICD-10-CM

## 2021-12-06 MED ORDER — BIKTARVY 50-200-25 MG PO TABS: 1.0000 | ORAL_TABLET | Freq: Every day | ORAL | 11 refills | Status: DC

## 2021-12-06 NOTE — Progress Notes (Signed)
Chief complaint: Follow-up for HIV disease on medications Subjective:    Patient ID: Nathan [name], male    DOB: 03/28/1951, 70 y.o.   MRN: [id number]  HPI  Nathan [name] is a 70 y.o. male whose HIV has been perfectly suppressed on Biktarvy and BID AZT  We ran [employer] Archive which is shown below:      He has done well on Biktarvy alone since then.  He is currently follow with primary care and vascular surgery.      Past Medical History:  Diagnosis Date   Asthma    per 2003 [facility] pulm records pfts    Blood dyscrasia    HIV   Clotting disorder (HCC)    Colon polyps    noted previous colonoscopy [facility]   DDD (degenerative disc disease)    cervical spine   Depression    Diabetes mellitus    dx 2010   Diarrhea 02/24/2021   Dizziness 08/25/2021   Falls 08/25/2021   Fever    unknwon origin   GERD (gastroesophageal reflux disease)    Head swelling 05/23/2017   Hep C w/o coma, chronic (HCC)    History of syphilis    noted [facility] records   HIV infection (HCC)    undetectable viral load and CD4 ct 667 as of 11/2011   Hyperlipidemia    Hypertension    MRSA (methicillin resistant Staphylococcus aureus)    Multiple sclerosis (HCC)    in remission as of 11/2011 (diagnosed late 1980s)   Pain in limb-Right Leg 10/13/2013   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HCC)    2002   Pneumonia    Prosthesis fitting 03/10/2019   PVD (peripheral vascular disease) (HCC)    Left Stent 07/02/2008   Scalp lesion 07/26/2017   UTI (urinary tract infection)    Wears dentures     Past Surgical History:  Procedure Laterality Date   ABDOMINAL AORTOGRAM W/LOWER EXTREMITY N/A 08/07/2017   Procedure: ABDOMINAL AORTOGRAM W/LOWER EXTREMITY;  Surgeon: Brabham, Vance W, MD;  [facility];  Service: Cardiovascular;  Laterality: N/A;   ABOVE KNEE LEG AMPUTATION     Right Leg   COLONOSCOPY W/ BIOPSIES AND POLYPECTOMY     LOWER EXTREMITY ANGIOGRAM Left 12/26/2011   Procedure: LOWER  EXTREMITY ANGIOGRAM;  Surgeon: Vance W Brabham, MD;  Location: [facility];  Service: Cardiovascular;  Laterality: Left;   LOWER EXTREMITY ANGIOGRAM Left 08/17/2017   Procedure: LOWER EXTREMITY ANGIOGRAM LEFT LEG WITH RUNOFF AND Stenting.;  Surgeon: Brabham, Vance W, MD;  Location: [facility] OR;  Service: Vascular;  Laterality: Left;   MULTIPLE TOOTH EXTRACTIONS     OTHER SURGICAL HISTORY     right lower ext AKA with prothesis    OTHER SURGICAL HISTORY     left left with stents (Dr. Burham-[facility])   OTHER SURGICAL HISTORY     2003 colonoscopy 5 mm polyp transverse colon; (2)3mm  polyps in rectum-hyperplastic   PERIPHERAL VASCULAR INTERVENTION Left 08/17/2017   Procedure: POPLITEAL STENT;  Surgeon: Brabham, Vance W, MD;  Location: [facility];  Service: Vascular;  Laterality: Left;    Family History  Problem Relation Age of Onset   Diabetes Mother    Coronary artery disease Mother    Cancer Brother        colon caner stage 4 as of 11/2011 (unknown age of onset)   Coronary artery disease Father    Prostate cancer Brother    Colon cancer Brother      Rectal cancer Neg Hx       Social History   Socioeconomic History   Marital status: Single    Spouse name: Not on file   Number of children: Not on file   Years of education: 12   Highest education level: Not on file  Occupational History   Not on file  Tobacco Use   Smoking status: Former   Smokeless tobacco: Former    Types: Nurse, children's Use: Never used  Substance and Sexual Activity   Alcohol use: No    Alcohol/week: 0.0 standard drinks of alcohol   Drug use: No   Sexual activity: Yes    Partners: Male    Comment: declined condoms  Other Topics Concern   Not on file  Social History Narrative   Not on file   Social Determinants of Health   Financial Resource Strain: Not on file  Food Insecurity: Not on file  Transportation Needs: Not on file  Physical Activity: Not on file  Stress: Not on file  Social  Connections: Not on file    Allergies  Allergen Reactions   Sulfonamide Derivatives Hives     Current Outpatient Medications:    atorvastatin (LIPITOR) 40 MG tablet, TAKE 1 TABLET(40 MG) BY MOUTH DAILY, Disp: 90 tablet, Rfl: 2   bictegravir-emtricitabine-tenofovir AF (BIKTARVY) 50-200-25 MG TABS tablet, Take 1 tablet by mouth daily., Disp: 30 tablet, Rfl: 11   Blood Glucose Monitoring Suppl (BAYER CONTOUR MONITOR) W/DEVICE KIT, Use to check blood sugar as instructed up to 4 times a day, Disp: 1 kit, Rfl: 0   Blood Glucose Monitoring Suppl (FREESTYLE FREEDOM LITE) w/Device KIT, Use 5 times daily to check blood sugar., Disp: 1 kit, Rfl: 1   cilostazol (PLETAL) 50 MG tablet, Take 1 tablet (50 mg total) by mouth 2 (two) times daily., Disp: 60 tablet, Rfl: 3   clopidogrel (PLAVIX) 75 MG tablet, Take 1 tablet (75 mg total) by mouth daily., Disp: 90 tablet, Rfl: 3   diclofenac Sodium (VOLTAREN) 1 % GEL, Apply 4 g topically 4 (four) times daily as needed., Disp: 100 g, Rfl: 1   empagliflozin (JARDIANCE) 25 MG TABS tablet, Take 1 tablet (25 mg total) by mouth daily before breakfast., Disp: 90 tablet, Rfl: 3   ezetimibe (ZETIA) 10 MG tablet, Take 1 tablet (10 mg total) by mouth daily., Disp: 90 tablet, Rfl: 2   glucose blood (FREESTYLE LITE) test strip, Use 5 times daily to check blood sugar., Disp: 450 each, Rfl: 3   ibuprofen (ADVIL) 400 MG tablet, Take by mouth., Disp: , Rfl:    injection device for insulin (CEQUR SIMPLICITY 2U) DEVI, Apply as directed once every 3 days, Disp: 10 each, Rfl: 11   Injection Device for Insulin (CEQUR SIMPLICITY INSERTER) MISC, Apply as directed every 3 days, Disp: 1 each, Rfl: 1   insulin glargine, 2 Unit Dial, (TOUJEO MAX SOLOSTAR) 300 UNIT/ML Solostar Pen, Inject 56 Units into the skin in the morning., Disp: 15 mL, Rfl: 3   insulin lispro (HUMALOG) 100 UNIT/ML injection, Use to fill Cecur Simplicity up to 591 units every three days to use for mealtime insulin  injections three times a day, Disp: 60 mL, Rfl: 3   Insulin Pen Needle (BD PEN NEEDLE NANO U/F) 32G X 4 MM MISC, USE AS DIRECTED FOUR TIMES DAILY. Dx code  E11.42, Disp: 200 each, Rfl: 5   Lancet Devices (BAYER MICROLET 2 LANCING DEVIC) MISC, Use to check blood sugar  up to 3 ties a day, Disp: 1 each, Rfl: 2   Lancets (FREESTYLE) lancets, Use 5 times daily to check blood sugar., Disp: 450 each, Rfl: 3   lisinopril (ZESTRIL) 40 MG tablet, TAKE 1 TABLET BY MOUTH DAILY. HOLD IF SYSTOLIC BLOOD PRESSURE IS LESS THAN 110, Disp: 90 tablet, Rfl: 3   loperamide (IMODIUM) 2 MG capsule, Take 1 capsule (2 mg total) by mouth as needed for diarrhea or loose stools., Disp: 120 capsule, Rfl: 0   omeprazole (PRILOSEC) 40 MG capsule, TAKE 1 CAPSULE(40 MG) BY MOUTH DAILY, Disp: 90 capsule, Rfl: 1   pioglitazone (ACTOS) 30 MG tablet, Take 30 mg by mouth daily., Disp: , Rfl:    Semaglutide, 1 MG/DOSE, (OZEMPIC, 1 MG/DOSE,) 4 MG/3ML SOPN, Inject 1 mg into the skin once a week., Disp: 9 mL, Rfl: 2  Review of Systems  Constitutional:  Negative for activity change, appetite change, chills, diaphoresis, fatigue, fever and unexpected weight change.  HENT:  Negative for congestion, rhinorrhea, sinus pressure, sneezing, sore throat and trouble swallowing.   Eyes:  Negative for photophobia and visual disturbance.  Respiratory:  Negative for cough, chest tightness, shortness of breath, wheezing and stridor.   Cardiovascular:  Negative for chest pain, palpitations and leg swelling.  Gastrointestinal:  Negative for abdominal distention, abdominal pain, anal bleeding, blood in stool, constipation, diarrhea, nausea and vomiting.  Genitourinary:  Negative for difficulty urinating, dysuria, flank pain and hematuria.  Musculoskeletal:  Negative for arthralgias, back pain, gait problem, joint swelling and myalgias.  Skin:  Negative for color change, pallor, rash and wound.  Neurological:  Negative for dizziness, tremors, weakness and  light-headedness.  Hematological:  Negative for adenopathy. Does not bruise/bleed easily.  Psychiatric/Behavioral:  Negative for agitation, behavioral problems, confusion, decreased concentration, dysphoric mood and sleep disturbance.        Objective:   Physical Exam Constitutional:      Appearance: He is well-developed.  HENT:     Head: Normocephalic and atraumatic.  Eyes:     Conjunctiva/sclera: Conjunctivae normal.  Cardiovascular:     Rate and Rhythm: Normal rate and regular rhythm.  Pulmonary:     Effort: Pulmonary effort is normal. No respiratory distress.     Breath sounds: No wheezing.  Abdominal:     General: There is no distension.     Palpations: Abdomen is soft.  Musculoskeletal:        General: No tenderness. Normal range of motion.     Cervical back: Normal range of motion and neck supple.  Skin:    General: Skin is warm and dry.     Coloration: Skin is not pale.     Findings: No erythema or rash.  Neurological:     General: No focal deficit present.     Mental Status: He is alert and oriented to person, place, and time.  Psychiatric:        Mood and Affect: Mood normal.        Behavior: Behavior normal.        Thought Content: Thought content normal.        Judgment: Judgment normal.        Assessment & Plan:   HIV disease:  I reviewed his most recent viral load was not detected on July 1823 and CD4 count which was 529  Lab Results  Component Value Date   HIV1RNAQUANT Not Detected 11/22/2021   Lab Results  Component Value Date   CD4TABS 529 11/22/2021   CD4TABS 549   08/16/2021   CD4TABS 488 02/10/2021   I will continue his prescription for BIKTARVY    Pretension: He is continuing on lisinopril  Vitals:   12/06/21 1010  BP: (!) 145/71  Pulse: 69  Temp: 98.2 F (36.8 C)  SpO2: 95%     Hyperlipidemia continue Lipitor  PVD: he is continuing cilostozal, plavix  DM he is on insulin, ozempic

## 2021-12-29 ENCOUNTER — Other Ambulatory Visit: Payer: Self-pay | Admitting: Internal Medicine

## 2021-12-29 DIAGNOSIS — I739 Peripheral vascular disease, unspecified: Secondary | ICD-10-CM

## 2022-01-02 ENCOUNTER — Other Ambulatory Visit: Payer: Self-pay | Admitting: Internal Medicine

## 2022-01-02 DIAGNOSIS — Z794 Long term (current) use of insulin: Secondary | ICD-10-CM

## 2022-02-10 NOTE — Telephone Encounter (Signed)
Error

## 2022-02-13 ENCOUNTER — Other Ambulatory Visit: Payer: Self-pay

## 2022-02-13 DIAGNOSIS — I739 Peripheral vascular disease, unspecified: Secondary | ICD-10-CM

## 2022-02-13 MED ORDER — CLOPIDOGREL BISULFATE 75 MG PO TABS
75.0000 mg | ORAL_TABLET | Freq: Every day | ORAL | 3 refills | Status: DC
Start: 2022-02-13 — End: 2023-03-22

## 2022-02-27 ENCOUNTER — Other Ambulatory Visit: Payer: Self-pay | Admitting: Student

## 2022-02-27 DIAGNOSIS — K219 Gastro-esophageal reflux disease without esophagitis: Secondary | ICD-10-CM

## 2022-03-13 ENCOUNTER — Ambulatory Visit: Payer: Medicare (Managed Care) | Admitting: Internal Medicine

## 2022-03-13 NOTE — Progress Notes (Deleted)
Name: Nathan [name]  MRN/ DOB: [id number], [birth year]   Age/ Sex: 71 y.o., male    PCP: Delene Ruffini, MD   Reason for Endocrinology Evaluation: Type 2 Diabetes Mellitus     Date of Initial Endocrinology Visit: 09/09/2021    PATIENT IDENTIFIER: [name] is a 71 y.o. male with a past medical history of T2DM, HIV, HTN, GERD and Dyslipidemia. The patient presented for initial endocrinology clinic visit on 03/13/2022 for consultative assistance with his diabetes management.    HPI: Nathan [name] was    Diagnosed with DM 2010 Prior Medications tried/Intolerance: Jardiance started 08/2021. Has metformin intolerance . Started Ozempic 2022          Hemoglobin A1c has ranged from 5.6% in 2019, peaking at 10.5% in 2022.   Unable to tolerate CGM as it kept falling off and does not use cellphone  No prior DKA    On his initial visit to our clinic he had an A1c of 7.2%, we increased Jardiance, continued Ozempic, adjusted MDI regimen   SUBJECTIVE:   During the last visit (09/09/2021): A1c 7.2%  Today (03/13/22): Nathan [name] is here for a follow up on diabetes management . He checks his blood sugars *** times daily. The patient has *** had hypoglycemic episodes since the last clinic visit, which typically occur *** x / - most often occuring ***. The patient is *** symptomatic with these episodes, with symptoms of {symptoms; hypoglycemia:[id number]}.      HOME DIABETES REGIMEN: Jardiance 25 mg daily  Toujeo 56  units daily  HUmalog 20 units with meal   Humalog 2 units with each snack Ozempic 1 mg weekly ( Tuesdays)  CeQur    Statin: yes ACE-I/ARB: yes    METER DOWNLOAD SUMMARY:unable to download 14 day average 168   79- 572  DIABETIC COMPLICATIONS: Microvascular complications:  S/P right Above knee amputations, neuropathy Denies: CKD , retinopathy Last eye exam: Completed 2022  Macrovascular complications:  PVD Denies: CAD, CVA   PAST HISTORY: Past  Medical History:  Past Medical History:  Diagnosis Date   Asthma    per 2003 [facility] pulm records pfts    Blood dyscrasia    HIV   Clotting disorder ([place])    Colon polyps    noted previous colonoscopy [facility]   DDD (degenerative disc disease)    cervical spine   Depression    Diabetes mellitus    dx 2010   Diarrhea 02/24/2021   Dizziness 08/25/2021   Falls 08/25/2021   Fever    unknwon origin   GERD (gastroesophageal reflux disease)    Head swelling 05/23/2017   Hep C w/o coma, chronic (HCC)    History of syphilis    noted [facility] records   HIV infection ([place])    undetectable viral load and CD4 ct 667 as of 11/2011   Hyperlipidemia    Hypertension    MRSA (methicillin resistant Staphylococcus aureus)    Multiple sclerosis ([place])    in remission as of 11/2011 (diagnosed late 1980s)   Pain in limb-Right Leg 10/13/2013   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[place])    2002   Pneumonia    Prosthesis fitting 03/10/2019   PVD (peripheral vascular disease) ([place])    Left Stent 07/02/2008   Scalp lesion 07/26/2017   UTI (urinary tract infection)    Wears dentures    Past Surgical History:  Past Surgical History:  Procedure Laterality Date   ABDOMINAL AORTOGRAM W/LOWER EXTREMITY N/A 08/07/2017  Procedure: ABDOMINAL AORTOGRAM W/LOWER EXTREMITY;  Surgeon: Serafina Mitchell, MD;  Location: [facility];  Service: Cardiovascular;  Laterality: N/A;   ABOVE KNEE LEG AMPUTATION     Right Leg   COLONOSCOPY W/ BIOPSIES AND POLYPECTOMY     LOWER EXTREMITY ANGIOGRAM Left 12/26/2011   Procedure: LOWER EXTREMITY ANGIOGRAM;  Surgeon: Serafina Mitchell, MD;  [facility];  Service: Cardiovascular;  Laterality: Left;   LOWER EXTREMITY ANGIOGRAM Left 08/17/2017   Procedure: LOWER EXTREMITY ANGIOGRAM LEFT LEG WITH RUNOFF AND Stenting.;  Surgeon: Serafina Mitchell, MD;  Location: [facility] OR;  Service: Vascular;  Laterality: Left;   MULTIPLE TOOTH EXTRACTIONS     OTHER SURGICAL HISTORY     right lower  ext AKA with prothesis    OTHER SURGICAL HISTORY     left left with stents (Dr. Seward Speck)   OTHER SURGICAL HISTORY     2003 colonoscopy 5 mm polyp transverse colon; (2)44m  polyps in rectum-hyperplastic   PERIPHERAL VASCULAR INTERVENTION Left 08/17/2017   Procedure: POPLITEAL STENT;  Surgeon: BSerafina Mitchell MD;  [place]  Service: Vascular;  Laterality: Left;    Social History:  reports that he has quit smoking. He has quit using smokeless tobacco.  His smokeless tobacco use included chew. He reports that he does not drink alcohol and does not use drugs. Family History:  Family History  Problem Relation Age of Onset   Diabetes Mother    Coronary artery disease Mother    Cancer Brother        colon caner stage 4 as of 11/2011 (unknown age of onset)   Coronary artery disease Father    Prostate cancer Brother    Colon cancer Brother    Rectal cancer Neg Hx      HOME MEDICATIONS: Allergies as of 03/13/2022       Reactions   Sulfonamide Derivatives Hives        Medication List        Accurate as of March 13, 2022  7:04 AM. If you have any questions, ask your nurse or doctor.          atorvastatin 40 MG tablet Commonly known as: LIPITOR TAKE 1 TABLET(40 MG) BY MOUTH DAILY   Bayer Contour Monitor w/Device Kit Use to check blood sugar as instructed up to 4 times a day   FreeStyle Freedom Lite w/Device Kit Use 5 times daily to check blood sugar.   Bayer Microlet 2 Lancing Devic Misc Use to check blood sugar up to 3 ties a day   BD Pen Needle Nano U/F 32G X 4 MM Misc Generic drug: Insulin Pen Needle USE AS DIRECTED FOUR TIMES DAILY. Dx code  E11.42   Biktarvy 50-200-25 MG Tabs tablet Generic drug: bictegravir-emtricitabine-tenofovir AF Take 1 tablet by mouth daily.   CeQur Simplicity 2U Devi Generic drug: injection device for insulin Apply as directed once every 3 days   [place] directed every 3 days    cilostazol 50 MG tablet Commonly known as: PLETAL TAKE 1 TABLET(50 MG) BY MOUTH TWICE DAILY   clopidogrel 75 MG tablet Commonly known as: PLAVIX Take 1 tablet (75 mg total) by mouth daily.   diclofenac Sodium 1 % Gel Commonly known as: Voltaren Apply 4 g topically 4 (four) times daily as needed.   empagliflozin 25 MG Tabs tablet Commonly known as: Jardiance Take 1 tablet (25 mg total) by mouth daily before breakfast.   ezetimibe  10 MG tablet Commonly known as: Zetia Take 1 tablet (10 mg total) by mouth daily.   freestyle lancets Use 5 times daily to check blood sugar.   FREESTYLE LITE test strip Generic drug: glucose blood Use 5 times daily to check blood sugar.   ibuprofen 400 MG tablet Commonly known as: ADVIL Take by mouth.   insulin lispro 100 UNIT/ML injection Commonly known as: HumaLOG USE TO FILL CECUR SIMPLICITY UP TO 782 UNITS EVERY 3 DAYS TO USE FOR MEALTIME INSULIN INJECTIONS THREE TIMES DAILY   lisinopril 40 MG tablet Commonly known as: ZESTRIL TAKE 1 TABLET BY MOUTH DAILY. HOLD IF SYSTOLIC BLOOD PRESSURE IS LESS THAN 110   loperamide 2 MG capsule Commonly known as: IMODIUM Take 1 capsule (2 mg total) by mouth as needed for diarrhea or loose stools.   omeprazole 40 MG capsule Commonly known as: PRILOSEC TAKE 1 CAPSULE(40 MG) BY MOUTH DAILY   Ozempic (1 MG/DOSE) 4 MG/3ML Sopn Generic drug: Semaglutide (1 MG/DOSE) Inject 1 mg into the skin once a week.   pioglitazone 30 MG tablet Commonly known as: ACTOS Take 30 mg by mouth daily.   Toujeo Max SoloStar 300 UNIT/ML Solostar Pen Generic drug: insulin glargine (2 Unit Dial) Inject 56 Units into the skin in the morning.         ALLERGIES: Allergies  Allergen Reactions   Sulfonamide Derivatives Hives     REVIEW OF SYSTEMS: A comprehensive ROS was conducted with the patient and is negative except as per HPI    OBJECTIVE:   VITAL SIGNS: There were no vitals taken for this  visit.   PHYSICAL EXAM:  General: Pt appears well and is in NAD  Neck: General: Supple without adenopathy or carotid bruits. Thyroid: Thyroid size normal.  No goiter or nodules appreciated.   Lungs: Clear with good BS bilat with no rales, rhonchi, or wheezes  Heart: RRR with normal S1 and S2 and no gallops; no murmurs; no rub  Abdomen: Normoactive bowel sounds, soft, nontender, without masses or organomegaly palpable  Extremities:  Lower extremities - No pretibial edema on left . S/P Right AKA  Neuro: MS is good with appropriate affect, pt is alert and Ox3    DATA REVIEWED:  Lab Results  Component Value Date   HGBA1C 7.2 (A) 08/29/2021   HGBA1C 7.6 (A) 05/30/2021   HGBA1C 10.5 (A) 02/09/2021   Lab Results  Component Value Date   MICROALBUR 0.7 03/17/2015   LDLCALC 45 08/16/2021   CREATININE 1.09 11/22/2021   Lab Results  Component Value Date   MICRALBCREAT 9 03/17/2015    Lab Results  Component Value Date   CHOL 113 08/16/2021   HDL 45 08/16/2021   LDLCALC 45 08/16/2021   TRIG 144 08/16/2021   CHOLHDL 2.5 08/16/2021        ASSESSMENT / PLAN / RECOMMENDATIONS:   1) Type 2 Diabetes Mellitus, Optimally controlled, With Neuropathic and  macrovascular complications - Most recent A1c of 7.2 %. Goal A1c < 7.0 %.    Plan: GENERAL: I have discussed with the patient the pathophysiology of diabetes. I explained the complications associated with diabetes including retinopathy, nephropathy, neuropathy as well as increased risk of cardiovascular disease. We went over the benefit seen with glycemic control.  I have praised the patient on improved glycemic control Patient with fecal incontinence, I discussed with the patient that Ozempic exacerbates the symptoms as it can cause diarrhea and loose stools.  I have given him the option  to discontinue Ozempic but it has improved his glycemic control tremendously and he would like to continue on it for now  He is tolerating the  Jardiance well, will increase as below Patient has been noted with tight BG's overnight we will reduce basal insulin He was also advised to use Humalog 2 units with snacks   MEDICATIONS: Increase Jardiance 25 mg, 1 tablet daily Continue Ozempic 1 mg weekly (Tuesdays) Decrease Toujeo 56 units daily Continue Humalog 20 units with each meal (= 10 clicks) Start Humalog 2 units with each snack (= 1 click)  EDUCATION / INSTRUCTIONS: BG monitoring instructions: Patient is instructed to check his blood sugars 3 times a day, before meals. Call [facility] if: BG persistently < 70  I reviewed the Rule of 15 for the treatment of hypoglycemia in detail with the patient. Literature supplied.   2) Diabetic complications:  Eye: Does not have known diabetic retinopathy.  Neuro/ Feet: Does  have known diabetic peripheral neuropathy. Renal: Patient does not have known baseline CKD. He is  on an ACEI/ARB at present.  3) Dyslipidemia :    -LDL and TG at goal -No change   Medication  Continue atorvastatin 40 mg daily   Follow-up in 6 months    Signed electronically by: Mack Guise, MD  [facility]  [facility] [address], [place] [address] Phone: [phone] FAX: [phone]   CC: Delene Ruffini, MD [place] [address] Phone: [phone]  Fax: [phone]    Return to Endocrinology clinic as below: Future Appointments  Date Time Provider [place]  03/13/2022  9:30 AM Jerico Grisso, Melanie Crazier, MD [facility] None  04/04/2022 10:15 AM Romana Juniper, MD [facility]  06/06/2022  9:45 AM [facility]  06/20/2022  9:45 AM Tommy Medal, Lavell Islam, MD [facility]

## 2022-03-20 ENCOUNTER — Telehealth: Payer: Self-pay

## 2022-03-20 NOTE — Telephone Encounter (Signed)
Patient states that he has enough patches until appointment on 03/28/22

## 2022-03-20 NOTE — Telephone Encounter (Signed)
Per [employer] will not cover the Simplicity Patch Kit the preferred is VGO.

## 2022-03-21 ENCOUNTER — Other Ambulatory Visit (HOSPITAL_COMMUNITY): Payer: Self-pay

## 2022-03-22 ENCOUNTER — Other Ambulatory Visit (HOSPITAL_COMMUNITY): Payer: Self-pay

## 2022-03-23 ENCOUNTER — Other Ambulatory Visit (HOSPITAL_COMMUNITY): Payer: Self-pay

## 2022-03-28 ENCOUNTER — Ambulatory Visit (INDEPENDENT_AMBULATORY_CARE_PROVIDER_SITE_OTHER): Payer: Medicare (Managed Care) | Admitting: Internal Medicine

## 2022-03-28 ENCOUNTER — Encounter: Payer: Self-pay | Admitting: Internal Medicine

## 2022-03-28 VITALS — BP 130/70 | HR 72 | Ht 73.0 in | Wt 187.0 lb

## 2022-03-28 DIAGNOSIS — Z23 Encounter for immunization: Secondary | ICD-10-CM | POA: Diagnosis not present

## 2022-03-28 DIAGNOSIS — E1151 Type 2 diabetes mellitus with diabetic peripheral angiopathy without gangrene: Secondary | ICD-10-CM | POA: Diagnosis not present

## 2022-03-28 DIAGNOSIS — Z794 Long term (current) use of insulin: Secondary | ICD-10-CM | POA: Diagnosis not present

## 2022-03-28 DIAGNOSIS — E1169 Type 2 diabetes mellitus with other specified complication: Secondary | ICD-10-CM

## 2022-03-28 DIAGNOSIS — E785 Hyperlipidemia, unspecified: Secondary | ICD-10-CM | POA: Diagnosis not present

## 2022-03-28 DIAGNOSIS — E7800 Pure hypercholesterolemia, unspecified: Secondary | ICD-10-CM

## 2022-03-28 LAB — POCT GLYCOSYLATED HEMOGLOBIN (HGB A1C): Hemoglobin A1C: 9.2 % — AB (ref 4.0–5.6)

## 2022-03-28 MED ORDER — ATORVASTATIN CALCIUM 40 MG PO TABS: ORAL_TABLET | ORAL | 2 refills | Status: DC

## 2022-03-28 MED ORDER — FREESTYLE LITE W/DEVICE KIT: 1.0000 | PACK | Freq: Every day | 0 refills | Status: AC

## 2022-03-28 MED ORDER — HUMALOG KWIKPEN 200 UNIT/ML ~~LOC~~ SOPN: PEN_INJECTOR | SUBCUTANEOUS | 3 refills | Status: DC

## 2022-03-28 MED ORDER — TOUJEO MAX SOLOSTAR 300 UNIT/ML ~~LOC~~ SOPN: 60.0000 [IU] | PEN_INJECTOR | Freq: Every morning | SUBCUTANEOUS | 3 refills | Status: DC

## 2022-03-28 MED ORDER — FREESTYLE LITE TEST VI STRP: 1.0000 | ORAL_STRIP | Freq: Three times a day (TID) | 3 refills | Status: DC

## 2022-03-28 MED ORDER — BD PEN NEEDLE NANO U/F 32G X 4 MM MISC: 1.0000 | Freq: Four times a day (QID) | 3 refills | Status: DC

## 2022-03-28 MED ORDER — EMPAGLIFLOZIN 25 MG PO TABS: 25.0000 mg | ORAL_TABLET | Freq: Every day | ORAL | 3 refills | Status: DC

## 2022-03-28 NOTE — Patient Instructions (Signed)
Continue Jardiance 25 mg, 1 tablet every morning  daily  Continue Ozempic 1 mg weekly  Increase  Toujeo 60  units every morning  HUmalog 20 units with each meal   Humalog correctional insulin: ADD extra units on insulin to your meal-time Humalog dose if your blood sugars are higher than 155. Use the scale below to help guide you:   Blood sugar before meal Number of units to inject  Less than 155 0 unit  156 -  180 1 units  181 -  205 2 units  206 -  230 3 units  231 -  255 4 units  256 -  280 5 units  281 -  305 6 units  306 -  330 7 units  331 -  355 8 units  356 - 380 9 units       HOW TO TREAT LOW BLOOD SUGARS (Blood sugar LESS THAN 70 MG/DL) Please follow the RULE OF 15 for the treatment of hypoglycemia treatment (when your (blood sugars are less than 70 mg/dL)   STEP 1: Take 15 grams of carbohydrates when your blood sugar is low, which includes:  3-4 GLUCOSE TABS  OR 3-4 OZ OF JUICE OR REGULAR SODA OR ONE TUBE OF GLUCOSE GEL    STEP 2: RECHECK blood sugar in 15 MINUTES STEP 3: If your blood sugar is still low at the 15 minute recheck --> then, go back to STEP 1 and treat AGAIN with another 15 grams of carbohydrates.

## 2022-03-28 NOTE — Progress Notes (Signed)
Name: Nathan [name]  MRN/ DOB: [id number], [birth year]   Age/ Sex: 71 y.o., male    PCP: Delene Ruffini, MD   Reason for Endocrinology Evaluation: Type 2 Diabetes Mellitus     Date of Initial Endocrinology Visit: 09/09/2021    PATIENT IDENTIFIER: [name] is a 71 y.o. male with a past medical history of T2DM, HIV, HTN, GERD and Dyslipidemia. The patient presented for initial endocrinology clinic visit on 03/28/2022 for consultative assistance with his diabetes management.    HPI: [name] was    Diagnosed with DM 2010 Prior Medications tried/Intolerance: Jardiance started 08/2021. Has metformin intolerance . Started Ozempic 2022          Hemoglobin A1c has ranged from 5.6% in 2019, peaking at 10.5% in 2022.   Unable to tolerate CGM as it kept falling off and does not use cellphone  No prior DKA    On his initial visit to our clinic he had an A1c of 7.2%, we increased Jardiance, continued Ozempic, adjusted MDI regimen   SUBJECTIVE:   During the last visit (09/09/2021): A1c 7.2%  Today (03/28/22): Nathan [name] is here for a follow up on diabetes management . He checks his blood sugars 1-3 times daily. The patient has not had hypoglycemic episodes since the last clinic visit.   Pt admits to dietary indiscretions , snacks all the time, he eats 3 meals a day.    HOME DIABETES REGIMEN: Jardiance 25 mg daily  Toujeo 56  units daily  HUmalog 20 units with meal  - 10 clicks  Humalog 2 units with each snack- not using  Ozempic 1 mg weekly ( Tuesdays)  CeQur    Statin: yes ACE-I/ARB: yes    METER DOWNLOAD SUMMARY:unable to download 7 day average 177  72- 174  DIABETIC COMPLICATIONS: Microvascular complications:  S/P right Above knee amputations, neuropathy Denies: CKD , retinopathy Last eye exam: Completed 2022  Macrovascular complications:  PVD Denies: CAD, CVA   PAST HISTORY: Past Medical History:  Past Medical History:  Diagnosis Date    Asthma    per 2003 [facility] pulm records [facility]    Blood dyscrasia    HIV   Clotting disorder ([employer])    Colon polyps    noted previous colonoscopy [facility]   DDD (degenerative disc disease)    cervical spine   Depression    Diabetes mellitus    dx 2010   Diarrhea 02/24/2021   Dizziness 08/25/2021   Falls 08/25/2021   Fever    [place] origin   GERD (gastroesophageal reflux disease)    Head swelling 05/23/2017   Hep C w/o coma, chronic (HCC)    History of syphilis    noted [facility] records   HIV infection ([place])    undetectable viral load and CD4 ct 667 as of 11/2011   Hyperlipidemia    Hypertension    MRSA (methicillin resistant Staphylococcus aureus)    Multiple sclerosis ([place])    in remission as of 11/2011 (diagnosed late 1980s)   Pain in limb-Right Leg 10/13/2013   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[place])    2002   Pneumonia    Prosthesis fitting 03/10/2019   PVD (peripheral vascular disease) ([place])    Left Stent 07/02/2008   Scalp lesion 07/26/2017   UTI (urinary tract infection)    Wears dentures    Past Surgical History:  Past Surgical History:  Procedure Laterality Date   ABDOMINAL AORTOGRAM W/LOWER EXTREMITY N/A 08/07/2017   Procedure:  ABDOMINAL AORTOGRAM W/LOWER EXTREMITY;  Surgeon: Serafina Mitchell, MD;  Location: [place] [facility];  Service: Cardiovascular;  Laterality: N/A;   ABOVE KNEE LEG AMPUTATION     Right Leg   COLONOSCOPY W/ BIOPSIES AND POLYPECTOMY     LOWER EXTREMITY ANGIOGRAM Left 12/26/2011   Procedure: LOWER EXTREMITY ANGIOGRAM;  Surgeon: Serafina Mitchell, MD;  Location: [facility] CATH LAB;  Service: Cardiovascular;  Laterality: Left;   LOWER EXTREMITY ANGIOGRAM Left 08/17/2017   Procedure: LOWER EXTREMITY ANGIOGRAM LEFT LEG WITH RUNOFF AND Stenting.;  Surgeon: Serafina Mitchell, MD;  Location: [facility] OR;  Service: Vascular;  Laterality: Left;   MULTIPLE TOOTH EXTRACTIONS     OTHER SURGICAL HISTORY     right lower ext AKA with prothesis    OTHER SURGICAL HISTORY     left  left with stents (Dr. Seward Speck)   OTHER SURGICAL HISTORY     2003 colonoscopy 5 mm polyp transverse colon; (2)82m  polyps in rectum-hyperplastic   PERIPHERAL VASCULAR INTERVENTION Left 08/17/2017   Procedure: POPLITEAL STENT;  Surgeon: BSerafina Mitchell MD;  [facility]  Service: Vascular;  Laterality: Left;    Social History:  reports that he has quit smoking. He has quit using smokeless tobacco.  His smokeless tobacco use included chew. He reports that he does not drink alcohol and does not use drugs. Family History:  Family History  Problem Relation Age of Onset   Diabetes Mother    Coronary artery disease Mother    Cancer Brother        colon caner stage 4 as of 11/2011 (unknown age of onset)   Coronary artery disease Father    Prostate cancer Brother    Colon cancer Brother    Rectal cancer Neg Hx      HOME MEDICATIONS: Allergies as of 03/28/2022       Reactions   Sulfonamide Derivatives Hives        Medication List        Accurate as of March 28, 2022  9:49 AM. If you have any questions, ask your nurse or doctor.          STOP taking these medications    CeQur Simplicity 2U Devi Generic drug: injection device for insulin Stopped by: IDorita Sciara MD   [place]: IDorita Sciara MD       TAKE these medications    atorvastatin 40 MG tablet Commonly known as: LIPITOR TAKE 1 TABLET(40 MG) BY MOUTH DAILY   Bayer Microlet 2 Lancing Devic Misc Use to check blood sugar up to 3 ties a day   BD Pen Needle Nano U/F 32G X 4 MM Misc Generic drug: Insulin Pen Needle USE AS DIRECTED FOUR TIMES DAILY. Dx code  E11.42   Biktarvy 50-200-25 MG Tabs tablet Generic drug: bictegravir-emtricitabine-tenofovir AF Take 1 tablet by mouth daily.   cilostazol 50 MG tablet Commonly known as: PLETAL TAKE 1 TABLET(50 MG) BY MOUTH TWICE DAILY   clopidogrel 75 MG tablet Commonly known as: PLAVIX Take 1 tablet  (75 mg total) by mouth daily.   diclofenac Sodium 1 % Gel Commonly known as: Voltaren Apply 4 g topically 4 (four) times daily as needed.   empagliflozin 25 MG Tabs tablet Commonly known as: Jardiance Take 1 tablet (25 mg total) by mouth daily before breakfast.   ezetimibe 10 MG tablet Commonly known as: Zetia Take 1 tablet (10 mg total) by mouth daily.   freestyle lancets  Use 5 times daily to check blood sugar.   FREESTYLE LITE test strip Generic drug: glucose blood Use 5 times daily to check blood sugar. What changed: Another medication with the same name was added. Make sure you understand how and when to take each. Changed by: Dorita Sciara, MD   FREESTYLE LITE test strip Generic drug: glucose blood 1 each by Other route 3 (three) times daily. Use as instructed What changed: You were already taking a medication with the same name, and this prescription was added. Make sure you understand how and when to take each. Changed by: Dorita Sciara, MD   FreeStyle Lite w/Device Kit 1 Device by Does not apply route daily in the afternoon. What changed:  how much to take how to take this when to take this additional instructions Another medication with the same name was removed. Continue taking this medication, and follow the directions you see here. Changed by: Dorita Sciara, MD   ibuprofen 400 MG tablet Commonly known as: ADVIL Take by mouth.   insulin lispro 100 UNIT/ML injection Commonly known as: HumaLOG USE TO FILL CECUR SIMPLICITY UP TO 696 UNITS EVERY 3 DAYS TO USE FOR MEALTIME INSULIN INJECTIONS THREE TIMES DAILY   lisinopril 40 MG tablet Commonly known as: ZESTRIL TAKE 1 TABLET BY MOUTH DAILY. HOLD IF SYSTOLIC BLOOD PRESSURE IS LESS THAN 110   loperamide 2 MG capsule Commonly known as: IMODIUM Take 1 capsule (2 mg total) by mouth as needed for diarrhea or loose stools.   omeprazole 40 MG capsule Commonly known as: PRILOSEC TAKE 1  CAPSULE(40 MG) BY MOUTH DAILY   Ozempic (1 MG/DOSE) 4 MG/3ML Sopn Generic drug: Semaglutide (1 MG/DOSE) Inject 1 mg into the skin once a week.   pioglitazone 30 MG tablet Commonly known as: ACTOS Take 30 mg by mouth daily.   Toujeo Max SoloStar 300 UNIT/ML Solostar Pen Generic drug: insulin glargine (2 Unit Dial) Inject 56 Units into the skin in the morning.         ALLERGIES: Allergies  Allergen Reactions   Sulfonamide Derivatives Hives     REVIEW OF SYSTEMS: A comprehensive ROS was conducted with the patient and is negative except as per HPI    OBJECTIVE:   VITAL SIGNS: BP 130/70 (BP Location: Left Arm, Patient Position: Sitting, Cuff Size: Small)   Pulse 72   Ht _0  (1.854 m)   Wt 187 lb (84.8 kg)   SpO2 94%   BMI 24.67 kg/m    PHYSICAL EXAM:  General: Pt appears well and is in NAD  Lungs: Clear with good BS bilat   Heart: RRR   Neuro: MS is good with appropriate affect, pt is alert and Ox3    DATA REVIEWED:  Lab Results  Component Value Date   HGBA1C 9.2 (A) 03/28/2022   HGBA1C 7.2 (A) 08/29/2021   HGBA1C 7.6 (A) 05/30/2021    Latest Reference Range & Units 11/22/21 10:24  Sodium 135 - 146 mmol/L 141  Potassium 3.5 - 5.3 mmol/L 4.0  Chloride 98 - 110 mmol/L 107  CO2 20 - 32 mmol/L 25  Glucose 65 - 99 mg/dL 119 (H)  BUN 7 - 25 mg/dL 10  Creatinine 0.70 - 1.28 mg/dL 1.09  Calcium 8.6 - 10.3 mg/dL 9.7  BUN/Creatinine Ratio 6 - 22 (calc) NOT APPLICABLE  eGFR > OR = 60 mL/min/1.60m 72  (H): Data is abnormally high   ASSESSMENT / PLAN / RECOMMENDATIONS:   1) Type 2  Diabetes Mellitus, Optimally controlled, With Neuropathic and  macrovascular complications - Most recent A1c of 9.2 %. Goal A1c < 7.0 %.     - A1c trended up from 7.2% to 9.2% , pt with dietary indiscretions and unable to control this, he eats all day long  - CeQur is not on the formulary , V-go is but the max is 40 units of basal insulin a day and this will not be enough for him   -I did recommend insulin pump technology, patient does not have a cell phone as he is unable to learn how to use it, we have opted to avoid insulin pump technology as the patient has learning disability -He had tried the Dexcom in the past but did have difficulty using it and opted to remain on fingerstick -Unable to increase Ozempic due to rectal incontinence, he opted to remain on Ozempic as it has helped him -I have switched Humalog to pens -A new prescription for Freestyle lite meter and extra strips were sent to the pharmacy -Patient advised to check glucose before each meal and to use the correction scale that will be provided today   MEDICATIONS: Continue Jardiance 25 mg, 1 tablet daily Continue Ozempic 1 mg weekly (Tuesdays) Increase Toujeo 60 units daily Continue Humalog 20 units with each meal  Start correction factor : Humalog ( BG-  EDUCATION / INSTRUCTIONS: BG monitoring instructions: Patient is instructed to check his blood sugars 3 times a day, before meals. Call [place] [facility] if: BG persistently < 70  I reviewed the Rule of 15 for the treatment of hypoglycemia in detail with the patient. Literature supplied.   2) Diabetic complications:  Eye: Does not have known diabetic retinopathy.  Neuro/ Feet: Does  have known diabetic peripheral neuropathy. Renal: Patient does not have known baseline CKD. He is  on an ACEI/ARB at present.  3) Dyslipidemia :    -LDL and TG at goal -No change   Medication  Continue atorvastatin 40 mg daily   Follow-up in 6 months    Signed electronically by: Mack Guise, MD  [facility] End[facility]  [facility] [address] [place], [place] [address] Phone: [phone] FAX: [phone]   CC: Delene Ruffini, MD [place] [address] Phone: [phone]  Fax: [phone]    Return to Endocrinology clinic as below: Future Appointments  Date Time  Provider [place]  04/04/2022 10:15 AM Romana Juniper, MD [facility]  06/06/2022  9:45 AM [facility]  06/20/2022  9:45 AM Tommy Medal, Lavell Islam, MD [facility]

## 2022-03-29 ENCOUNTER — Other Ambulatory Visit (HOSPITAL_COMMUNITY): Payer: Self-pay

## 2022-04-04 ENCOUNTER — Other Ambulatory Visit: Payer: Self-pay

## 2022-04-04 ENCOUNTER — Encounter: Payer: Self-pay | Admitting: Dietician

## 2022-04-04 ENCOUNTER — Ambulatory Visit (INDEPENDENT_AMBULATORY_CARE_PROVIDER_SITE_OTHER): Payer: Medicare (Managed Care)

## 2022-04-04 ENCOUNTER — Ambulatory Visit (INDEPENDENT_AMBULATORY_CARE_PROVIDER_SITE_OTHER): Payer: Medicare (Managed Care) | Admitting: Student

## 2022-04-04 ENCOUNTER — Ambulatory Visit: Payer: Medicare (Managed Care) | Admitting: Dietician

## 2022-04-04 ENCOUNTER — Encounter: Payer: Self-pay | Admitting: Student

## 2022-04-04 VITALS — BP 121/60 | HR 68 | Temp 97.7°F | Ht 73.0 in | Wt 183.0 lb

## 2022-04-04 VITALS — BP 121/60 | HR 68 | Temp 97.7°F | Resp 32 | Ht 73.0 in | Wt 183.3 lb

## 2022-04-04 DIAGNOSIS — F17201 Nicotine dependence, unspecified, in remission: Secondary | ICD-10-CM

## 2022-04-04 DIAGNOSIS — Z0000 Encounter for general adult medical examination without abnormal findings: Secondary | ICD-10-CM | POA: Diagnosis not present

## 2022-04-04 DIAGNOSIS — E1151 Type 2 diabetes mellitus with diabetic peripheral angiopathy without gangrene: Secondary | ICD-10-CM | POA: Diagnosis not present

## 2022-04-04 DIAGNOSIS — E1169 Type 2 diabetes mellitus with other specified complication: Secondary | ICD-10-CM

## 2022-04-04 DIAGNOSIS — Z794 Long term (current) use of insulin: Secondary | ICD-10-CM

## 2022-04-04 LAB — HM DIABETES EYE EXAM

## 2022-04-04 NOTE — Assessment & Plan Note (Signed)
Referral to gastroenterology for colonoscopy

## 2022-04-04 NOTE — Patient Instructions (Signed)
Thank you, Mr.Kaiser D Stills for allowing [place] to provide your care today. Today we discussed you diabetes, your blood pressure, health care maintenance (please get the [place] vaccine at [facility]), and your history of smoking. .    I have ordered the following labs for you:   Lab Orders         Pancreatic Elastase, Fecal         Vitamin B12      I will call if any are abnormal. All of your labs can be accessed through "My Chart".  I have place a referrals to  diabetes education, gastroenterology for colonoscopy, and to get a CT scan because your history of smoking. .  My Chart Access: [email or web address]  Please follow-up in in 3 month  Please make sure to arrive 15 minutes prior to your next appointment. If you arrive late, you may be asked to reschedule.    We look forward to seeing you next time. Please call our clinic at [phone] if you have any questions or concerns. The best time to call is Monday-Friday from 9am-4pm, but there is someone available 24/7. If after hours or the weekend, call the main hospital number and ask for the Internal Medicine Resident On-Call. If you need medication refills, please notify your pharmacy one week in advance and they will send [place] a request.   Thank you for letting us take part in your care. Wishing you the best!  Romana Juniper, MD 04/04/2022, 11:39 AM [facility] Internal Medicine Resident, PGY-1

## 2022-04-04 NOTE — Patient Instructions (Signed)
Health Maintenance, Male Adopting a healthy lifestyle and getting preventive care are important in promoting health and wellness. Ask your health care provider about: The right schedule for you to have regular tests and exams. Things you can do on your own to prevent diseases and keep yourself healthy. What should I know about diet, weight, and exercise? Eat a healthy diet  Eat a diet that includes plenty of vegetables, fruits, low-fat dairy products, and lean protein. Do not eat a lot of foods that are high in solid fats, added sugars, or sodium. Maintain a healthy weight Body mass index (BMI) is a measurement that can be used to identify possible weight problems. It estimates body fat based on height and weight. Your health care provider can help determine your BMI and help you achieve or maintain a healthy weight. Get regular exercise Get regular exercise. This is one of the most important things you can do for your health. Most adults should: Exercise for at least 150 minutes each week. The exercise should increase your heart rate and make you sweat (moderate-intensity exercise). Do strengthening exercises at least twice a week. This is in addition to the moderate-intensity exercise. Spend less time sitting. Even light physical activity can be beneficial. Watch cholesterol and blood lipids Have your blood tested for lipids and cholesterol at 73 years of age, then have this test every 5 years. You may need to have your cholesterol levels checked more often if: Your lipid or cholesterol levels are high. You are older than 73 years of age. You are at high risk for heart disease. What should I know about cancer screening? Many types of cancers can be detected early and may often be prevented. Depending on your health history and family history, you may need to have cancer screening at various ages. This may include screening for: Colorectal cancer. Prostate cancer. Skin cancer. Lung  cancer. What should I know about heart disease, diabetes, and high blood pressure? Blood pressure and heart disease High blood pressure causes heart disease and increases the risk of stroke. This is more likely to develop in people who have high blood pressure readings or are overweight. Talk with your health care provider about your target blood pressure readings. Have your blood pressure checked: Every 3-5 years if you are 18-39 years of age. Every year if you are 40 years old or older. If you are between the ages of 65 and 75 and are a current or former smoker, ask your health care provider if you should have a one-time screening for abdominal aortic aneurysm (AAA). Diabetes Have regular diabetes screenings. This checks your fasting blood sugar level. Have the screening done: Once every three years after age 45 if you are at a normal weight and have a low risk for diabetes. More often and at a younger age if you are overweight or have a high risk for diabetes. What should I know about preventing infection? Hepatitis B If you have a higher risk for hepatitis B, you should be screened for this virus. Talk with your health care provider to find out if you are at risk for hepatitis B infection. Hepatitis C Blood testing is recommended for: Everyone born from 1945 through 1965. Anyone with known risk factors for hepatitis C. Sexually transmitted infections (STIs) You should be screened each year for STIs, including gonorrhea and chlamydia, if: You are sexually active and are younger than 73 years of age. You are older than 73 years of age and your   health care provider tells you that you are at risk for this type of infection. Your sexual activity has changed since you were last screened, and you are at increased risk for chlamydia or gonorrhea. Ask your health care provider if you are at risk. Ask your health care provider about whether you are at high risk for HIV. Your health care provider  may recommend a prescription medicine to help prevent HIV infection. If you choose to take medicine to prevent HIV, you should first get tested for HIV. You should then be tested every 3 months for as long as you are taking the medicine. Follow these instructions at home: Alcohol use Do not drink alcohol if your health care provider tells you not to drink. If you drink alcohol: Limit how much you have to 0-2 drinks a day. Know how much alcohol is in your drink. In the [place] one drink equals one 12 oz bottle of beer (355 mL), one 5 oz glass of wine (148 mL), or one 1 oz glass of hard liquor (44 mL). Lifestyle Do not use any products that contain nicotine or tobacco. These products include cigarettes, chewing tobacco, and vaping devices, such as e-cigarettes. If you need help quitting, ask your health care provider. Do not use street drugs. Do not share needles. Ask your health care provider for help if you need support or information about quitting drugs. General instructions Schedule regular health, dental, and eye exams. Stay current with your vaccines. Tell your health care provider if: You often feel depressed. You have ever been abused or do not feel safe at home. Summary Adopting a healthy lifestyle and getting preventive care are important in promoting health and wellness. Follow your health care provider's instructions about healthy diet, exercising, and getting tested or screened for diseases. Follow your health care provider's instructions on monitoring your cholesterol and blood pressure. This information is not intended to replace advice given to you by your health care provider. Make sure you discuss any questions you have with your health care provider. Document Revised: 09/13/2020 Document Reviewed: 09/13/2020 Elsevier Patient Education  2023 [employer]vier Inc.  

## 2022-04-04 NOTE — Progress Notes (Signed)
Retinal images were done today as part of this patient's yearly diabetes health maintenance program. They were transmitted electronically to an ophthalmologist who will interpret them and send a report back with their findings. This was explained to the patient and they were also informed that the results would be communicated to them either by phone,  mail or both.  [name], RD 04/04/2022 1:33 PM.

## 2022-04-04 NOTE — Progress Notes (Signed)
  Subjective:  CC: follow up  HPI:  [name] is a 71 y.o. male with a past medical history stated below and presents today for chronic medical conditions follow up. Please see problem based assessment and plan for additional details.  Past Medical History:  Diagnosis Date   Asthma    per 2003 [facility] pulm records [facility]    Blood dyscrasia    HIV   Clotting disorder (HCC)    Colon polyps    noted previous colonoscopy [facility]   DDD (degenerative disc disease)    cervical spine   Depression    Diabetes mellitus    dx 2010   Diarrhea 02/24/2021   Dizziness 08/25/2021   Falls 08/25/2021   Fever    unkn[place] origin   GERD (gastroesophageal reflux disease)    Head swelling 05/23/2017   Hep C w/o coma, chronic (HCC)    History of syphilis    noted [facility] records   HIV infection (HCC)    undetectable viral load and CD4 ct 667 as of 11/2011   Hyperlipidemia    Hypertension    MRSA (methicillin resistant Staphylococcus aureus)    Multiple sclerosis (HCC)    in remission as of 11/2011 (diagnosed late 1980s)   Pain in limb-Right Leg 10/13/2013   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HCC)    2002   Pneumonia    Prosthesis fitting 03/10/2019   PVD (peripheral vascular disease) (HCC)    Left Stent 07/02/2008   Scalp lesion 07/26/2017   UTI (urinary tract infection)    Wears dentures     Current Outpatient Medications on File Prior to Visit  Medication Sig Dispense Refill   atorvastatin (LIPITOR) 40 MG tablet TAKE 1 TABLET(40 MG) BY MOUTH DAILY 90 tablet 2   bictegravir-emtricitabine-tenofovir AF (BIKTARVY) 50-200-25 MG TABS tablet Take 1 tablet by mouth daily. 30 tablet 11   Blood Glucose Monitoring Suppl (FREESTYLE LITE) w/Device KIT 1 Device by Does not apply route daily in the afternoon. 1 kit 0   cilostazol (PLETAL) 50 MG tablet TAKE 1 TABLET(50 MG) BY MOUTH TWICE DAILY 60 tablet 3   clopidogrel (PLAVIX) 75 MG tablet Take 1 tablet (75 mg total) by mouth daily. 90 tablet 3    diclofenac Sodium (VOLTAREN) 1 % GEL Apply 4 g topically 4 (four) times daily as needed. 100 g 1   empagliflozin (JARDIANCE) 25 MG TABS tablet Take 1 tablet (25 mg total) by mouth daily before breakfast. 90 tablet 3   ezetimibe (ZETIA) 10 MG tablet Take 1 tablet (10 mg total) by mouth daily. 90 tablet 2   glucose blood (FREESTYLE LITE) test strip Use 5 times daily to check blood sugar. 450 each 3   glucose blood (FREESTYLE LITE) test strip 1 each by Other route 3 (three) times daily. Use as instructed 300 each 3   ibuprofen (ADVIL) 400 MG tablet Take by mouth.     insulin glargine, 2 Unit Dial, (TOUJEO MAX SOLOSTAR) 300 UNIT/ML Solostar Pen Inject 60 Units into the skin in the morning. 15 mL 3   insulin lispro (HUMALOG KWIKPEN) 200 UNIT/ML KwikPen Max daily 100 units 30 mL 3   Insulin Pen Needle (BD PEN NEEDLE NANO U/F) 32G X 4 MM MISC 1 Device by Other route in the morning, at noon, in the evening, and at bedtime. USE AS DIRECTED FOUR TIMES DAILY. Dx code  E11.42 400 each 3   Lancet Devices (BAYER MICROLET 2 LANCING DEVIC) MISC Use to check   blood sugar up to 3 ties a day 1 each 2   Lancets (FREESTYLE) lancets Use 5 times daily to check blood sugar. 450 each 3   lisinopril (ZESTRIL) 40 MG tablet TAKE 1 TABLET BY MOUTH DAILY. HOLD IF SYSTOLIC BLOOD PRESSURE IS LESS THAN 110 90 tablet 3   loperamide (IMODIUM) 2 MG capsule Take 1 capsule (2 mg total) by mouth as needed for diarrhea or loose stools. 120 capsule 0   omeprazole (PRILOSEC) 40 MG capsule TAKE 1 CAPSULE(40 MG) BY MOUTH DAILY 90 capsule 1   pioglitazone (ACTOS) 30 MG tablet Take 30 mg by mouth daily. (Patient not taking: Reported on 03/28/2022)     Semaglutide, 1 MG/DOSE, (OZEMPIC, 1 MG/DOSE,) 4 MG/3ML SOPN Inject 1 mg into the skin once a week. 9 mL 2   No current facility-administered medications on file prior to visit.    Family History  Problem Relation Age of Onset   Diabetes Mother    Coronary artery disease Mother    Cancer  Brother        colon caner stage 4 as of 11/2011 (unknown age of onset)   Coronary artery disease Father    Prostate cancer Brother    Colon cancer Brother    Rectal cancer Neg Hx     Social History   Socioeconomic History   Marital status: Single    Spouse name: Not on file   Number of children: Not on file   Years of education: 12   Highest education level: Not on file  Occupational History   Not on file  Tobacco Use   Smoking status: Former   Smokeless tobacco: Former    Types: Nurse, children's Use: Never used  Substance and Sexual Activity   Alcohol use: No    Alcohol/week: 0.0 standard drinks of alcohol   Drug use: No   Sexual activity: Yes    Partners: Male    Comment: declined condoms  Other Topics Concern   Not on file  Social History Narrative   Not on file   Social Determinants of Health   Financial Resource Strain: Low Risk  (04/04/2022)   Overall Financial Resource Strain (CARDIA)    Difficulty of Paying Living Expenses: Not hard at all  Food Insecurity: [place] Present (04/04/2022)   Hunger Vital Sign    Worried About [place] in the Last Year: Sometimes true    Ran Out of Food in the Last Year: Never true  Transportation Needs: No Transportation Needs (04/04/2022)   PRAPARE - Hydrologist (Medical): No    Lack of Transportation (Non-Medical): No  Physical Activity: Insufficiently Active (04/04/2022)   Exercise Vital Sign    Days of Exercise per Week: 1 day    Minutes of Exercise per Session: 20 min  Stress: No Stress Concern Present (04/04/2022)   [place]    Feeling of Stress : Not at all  Social Connections: Socially Isolated (04/04/2022)   Social Connection and Isolation Panel [NHANES]    Frequency of Communication with Friends and Family: Never    Frequency of Social Gatherings with Friends and Family: Once a week     Attends Religious Services: Never    Marine scientist or Organizations: No    Attends Archivist Meetings: Never    Marital Status: Divorced  Human resources officer Violence: Not At Risk (04/04/2022)  Humiliation, Afraid, Rape, and Kick questionnaire    Fear of Current or Ex-Partner: No    Emotionally Abused: No    Physically Abused: No    Sexually Abused: No    Review of Systems: ROS negative except for what is noted on the assessment and plan.  Objective:   Vitals:   04/04/22 1019  BP: 121/60  Pulse: 68  Resp: (!) 32  Temp: 97.7 F (36.5 C)  TempSrc: Oral  SpO2: 97%  Weight: 183 lb 4.8 oz (83.1 kg)  Height: 6' 1" (1.854 m)    Physical Exam: Constitutional: well-appearing man sitting in exam chair, in no acute distress HENT: normocephalic atraumatic, mucous membranes moist Eyes: conjunctiva non-erythematous Neck: supple Cardiovascular: regular rate and rhythm, no m/r/g Pulmonary/Chest: normal work of breathing on room air, lungs clear to auscultation bilaterally Abdominal: soft, non-tender, non-distended MSK: normal bulk and tone. Prosthetic in the R foot. L foot with no visible deformities Neurological: alert & oriented x 3, 5/5 strength in bilateral upper and lower extremities, normal gait Skin: warm and dry Psych: pleasant mood and appropriate affect     Assessment & Plan:   Type 2 DM with diabetic peripheral angiopathy w/o gangrene (HCC) Patient being followed by [place] endocrinology for Diabetes. Recent A1c A1c 9.1, which was higher than prior. His medication was adjusted so that Patient would take Toujeo 60 units daily again. He has continued Ozempic 1mg weekly, Jardiance 25 mg daily, and Humalog 20 with meals. Patient still reports fecal incontinence, initially thought to be in the setting of Ozempic but patient has been off of Ozempic twice without resolution. He has also completed full infectious work up. I am concerned this is likely in the  setting of pancreatic insufficiency, so will send for Fecal elastase. Plan -Foot exam today; while there were no ulcerations on exam, patient has severe reduction in sensation of his L foot to the mid plantar and dorsum. Given hx of amputation, will refer to podiatry for care and frequent evaluation. - Fecal elastase -B12 level -Diabetes Education referral -Retinal eye exam today -Continue medications as detailed above  Healthcare maintenance Referral to gastroenterology for colonoscopy   Tobacco use disorder, severe, in sustained remission, in controlled environment [name] is a 71 y.o. Past smoker (1 ppd for 40 years) with approximately a 40 pack year history. Therefore, [name] does meet the USPTF recommendations for lung cancer screening with low dose CT in persons age 55 - 80 yrs with a 30 pack year history who are currently smoking or have quit in the past 15 yrs. We have discussed the risks and benefits of screening, including but not limited to the following. Lung cancer screening might detect about one half of lung cancer at an early, curative stage. For the average screened person, the risk of dying from lung cancer will decrease from 1.7% to 1.4% with screening. About 1 in 4 screened patients would have a false positive result leading to more radiation exposure, cost, anxiety, and / or invasive procedures. 95% of false positives will not lead to a diagnosis of cancer. After discussion, [name] has decided to pursue lung cancer screening.  -Low dose CT chest for screening ordered    Patient seen with Dr. E. Hoffman     

## 2022-04-04 NOTE — Assessment & Plan Note (Signed)
[name] is a 71 y.o. Past smoker (1 ppd for 40 years) with approximately a 40 pack year history. Therefore, [name] does meet the USPTF recommendations for lung cancer screening with low dose CT in persons age 22 - 9 yrs with a 30 pack year history who are currently smoking or have quit in the past 15 yrs. We have discussed the risks and benefits of screening, including but not limited to the following. Lung cancer screening might detect about one half of lung cancer at an early, curative stage. For the average screened person, the risk of dying from lung cancer will decrease from 1.7% to 1.4% with screening. About 1 in 4 screened patients would have a false positive result leading to more radiation exposure, cost, anxiety, and / or invasive procedures. 95% of false positives will not lead to a diagnosis of cancer. After discussion, [name] has decided to pursue lung cancer screening.  -Low dose CT chest for screening ordered

## 2022-04-04 NOTE — Progress Notes (Signed)
Subjective:   Nathan [name] is a 71 y.o. male who presents for an Initial Medicare Annual Wellness Visit. I connected with  Advait Buice Settlemire on 04/04/22 by a  in person visit.    Patient Location: Other:  [facility].   Provider Location: Office/Clinic  I discussed the limitations of evaluation and management by telemedicine. The patient expressed understanding and agreed to proceed.   Review of Systems    Defer to PCP.        Objective:    Today's Vitals   04/04/22 1402  BP: 121/60  Pulse: 68  Temp: 97.7 F (36.5 C)  TempSrc: Oral  SpO2: 97%  Weight: 183 lb (83 kg)  Height: _0  (1.854 m)   Body mass index is 24.14 kg/m.     04/04/2022   10:17 AM 08/29/2021    9:43 AM 06/21/2021   11:15 AM 04/04/2021    1:50 PM 02/09/2021   10:57 AM 10/07/2020    8:53 AM 09/13/2020   10:16 AM  Advanced Directives  Does Patient Have a Medical Advance Directive? _1  No No  Would patient like information on creating a medical advance directive? No - Patient declined No - Patient declined No - Patient declined No - Patient declined No - Patient declined No - Patient declined No - Patient declined    Current Medications (verified) Outpatient Encounter Medications as of 04/04/2022  Medication Sig   atorvastatin (LIPITOR) 40 MG tablet TAKE 1 TABLET(40 MG) BY MOUTH DAILY   bictegravir-emtricitabine-tenofovir AF (BIKTARVY) 50-200-25 MG TABS tablet Take 1 tablet by mouth daily.   Blood Glucose Monitoring Suppl (FREESTYLE LITE) w/Device KIT 1 Device by Does not apply route daily in the afternoon.   cilostazol (PLETAL) 50 MG tablet TAKE 1 TABLET(50 MG) BY MOUTH TWICE DAILY   clopidogrel (PLAVIX) 75 MG tablet Take 1 tablet (75 mg total) by mouth daily.   diclofenac Sodium (VOLTAREN) 1 % GEL Apply 4 g topically 4 (four) times daily as needed.   empagliflozin (JARDIANCE) 25 MG TABS tablet Take 1 tablet (25 mg total) by mouth daily before breakfast.   ezetimibe  (ZETIA) 10 MG tablet Take 1 tablet (10 mg total) by mouth daily.   glucose blood (FREESTYLE LITE) test strip Use 5 times daily to check blood sugar.   glucose blood (FREESTYLE LITE) test strip 1 each by Other route 3 (three) times daily. Use as instructed   ibuprofen (ADVIL) 400 MG tablet Take by mouth.   insulin glargine, 2 Unit Dial, (TOUJEO MAX SOLOSTAR) 300 UNIT/ML Solostar Pen Inject 60 Units into the skin in the morning.   insulin lispro (HUMALOG KWIKPEN) 200 UNIT/ML KwikPen Max daily 100 units   Insulin Pen Needle (BD PEN NEEDLE NANO U/F) 32G X 4 MM MISC 1 Device by Other route in the morning, at noon, in the evening, and at bedtime. USE AS DIRECTED FOUR TIMES DAILY. Dx code  E11.42   Lancet Devices (BAYER MICROLET 2 LANCING DEVIC) MISC Use to check blood sugar up to 3 ties a day   Lancets (FREESTYLE) lancets Use 5 times daily to check blood sugar.   lisinopril (ZESTRIL) 40 MG tablet TAKE 1 TABLET BY MOUTH DAILY. HOLD IF SYSTOLIC BLOOD PRESSURE IS LESS THAN 110   loperamide (IMODIUM) 2 MG capsule Take 1 capsule (2 mg total) by mouth as needed for diarrhea or loose stools.   omeprazole (PRILOSEC) 40 MG capsule TAKE 1 CAPSULE(40 MG) BY MOUTH  DAILY   pioglitazone (ACTOS) 30 MG tablet Take 30 mg by mouth daily. (Patient not taking: Reported on 03/28/2022)   Semaglutide, 1 MG/DOSE, (OZEMPIC, 1 MG/DOSE,) 4 MG/3ML SOPN Inject 1 mg into the skin once a week.   No facility-administered encounter medications on file as of 04/04/2022.    Allergies (verified) Sulfonamide derivatives   History: Past Medical History:  Diagnosis Date   Asthma    per 2003 [facility] pulm records pfts    Blood dyscrasia    HIV   Clotting disorder ([place])    Colon polyps    noted previous colonoscopy [facility]   DDD (degenerative disc disease)    cervical spine   Depression    Diabetes mellitus    dx 2010   Diarrhea 02/24/2021   Dizziness 08/25/2021   Falls 08/25/2021   Fever    [place] origin   GERD  (gastroesophageal reflux disease)    Head swelling 05/23/2017   Hep C w/o coma, chronic (HCC)    History of syphilis    noted [facility] records   HIV infection ([place])    undetectable viral load and CD4 ct 667 as of 11/2011   Hyperlipidemia    Hypertension    MRSA (methicillin resistant Staphylococcus aureus)    Multiple sclerosis ([place])    in remission as of 11/2011 (diagnosed late 1980s)   Pain in limb-Right Leg 10/13/2013   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[place])    2002   Pneumonia    Prosthesis fitting 03/10/2019   PVD (peripheral vascular disease) ([place])    Left Stent 07/02/2008   Scalp lesion 07/26/2017   UTI (urinary tract infection)    Wears dentures    Past Surgical History:  Procedure Laterality Date   ABDOMINAL AORTOGRAM W/LOWER EXTREMITY N/A 08/07/2017   Procedure: ABDOMINAL AORTOGRAM W/LOWER EXTREMITY;  Surgeon: Serafina Mitchell, MD;  Location: [place] CV LAB;  Service: Cardiovascular;  Laterality: N/A;   ABOVE KNEE LEG AMPUTATION     Right Leg   COLONOSCOPY W/ BIOPSIES AND POLYPECTOMY     LOWER EXTREMITY ANGIOGRAM Left 12/26/2011   Procedure: LOWER EXTREMITY ANGIOGRAM;  Surgeon: Serafina Mitchell, MD;  Location: [facility] C[facility] LAB;  Service: Cardiovascular;  Laterality: Left;   LOWER EXTREMITY ANGIOGRAM Left 08/17/2017   Procedure: LOWER EXTREMITY ANGIOGRAM LEFT LEG WITH RUNOFF AND Stenting.;  Surgeon: Serafina Mitchell, MD;  Location: [facility] OR;  Service: Vascular;  Laterality: Left;   MULTIPLE TOOTH EXTRACTIONS     OTHER SURGICAL HISTORY     right lower ext AKA with prothesis    OTHER SURGICAL HISTORY     left left with stents (Dr. Seward Speck)   OTHER SURGICAL HISTORY     2003 colonoscopy 5 mm polyp transverse colon; (2)37m  polyps in rectum-hyperplastic   PERIPHERAL VASCULAR INTERVENTION Left 08/17/2017   Procedure: POPLITEAL STENT;  Surgeon: BSerafina Mitchell MD;  Location: [facility] OR;  Service: Vascular;  Laterality: Left;   Family History  Problem Relation Age of Onset    Diabetes Mother    Coronary artery disease Mother    Cancer Brother        colon caner stage 4 as of 11/2011 (unknown age of onset)   Coronary artery disease Father    Prostate cancer Brother    Colon cancer Brother    Rectal cancer Neg Hx    Social History   Socioeconomic History   Marital status: Single    Spouse name: Not on file   Number of  children: Not on file   Years of education: 12   Highest education level: Not on file  Occupational History   Not on file  Tobacco Use   Smoking status: Former   Smokeless tobacco: Former    Types: Nurse, children's Use: Never used  Substance and Sexual Activity   Alcohol use: No    Alcohol/week: 0.0 standard drinks of alcohol   Drug use: No   Sexual activity: Yes    Partners: Male    Comment: declined condoms  Other Topics Concern   Not on file  Social History Narrative   Not on file   Social Determinants of Health   Financial Resource Strain: Low Risk  (04/04/2022)   Overall Financial Resource Strain (CARDIA)    Difficulty of Paying Living Expenses: Not hard at all  Food Insecurity: No Food Insecurity (04/04/2022)   Hunger Vital Sign    Worried About Running Out of Food in the Last Year: Never true    [place] in the Last Year: Never true  Transportation Needs: No Transportation Needs (04/04/2022)   PRAPARE - Hydrologist (Medical): No    Lack of Transportation (Non-Medical): No  Physical Activity: Not on file  Stress: Not on file  Social Connections: Not on file    Tobacco Counseling Counseling given: Not Answered   Clinical Intake:                 Diabetic?yes         Activities of Daily Living    04/04/2022   10:17 AM 08/29/2021    9:43 AM  In your present state of health, do you have any difficulty performing the following activities:  Hearing? 0 0  Vision? 0 0  Difficulty concentrating or making decisions? 0 0  Walking or climbing stairs? 1 1   Comment Prothesis   Dressing or bathing? 0 0  Doing errands, shopping? 0 0    Patient Care Team: Delene Ruffini, MD as PCP - General Tommy Medal, Lavell Islam, MD as PCP - Infectious Diseases (Infectious Diseases) Plyler, Chauncey Reading, RD as Dietitian (Dietician) Milus Banister, MD as Attending Physician (Gastroenterology)  Indicate any recent Medical Services you may have received from other than [facility] providers in the past year (date may be approximate).     Assessment:   This is a routine wellness examination for [place].  Hearing/Vision screen No results found.  Dietary issues and exercise activities discussed:     Goals Addressed   None   Depression Screen    04/04/2022   10:26 AM 12/06/2021   10:12 AM 08/29/2021    9:43 AM 08/25/2021    9:48 AM 05/30/2021   11:14 AM 04/04/2021    1:49 PM 02/24/2021    9:35 AM  PHQ 2/9 Scores  PHQ - 2 Score 0 0 0 0 0 0 0  PHQ- 9 Score   0  2 0     Fall Risk    04/04/2022   10:17 AM 12/06/2021   10:12 AM 08/29/2021    9:42 AM 08/25/2021    9:48 AM 05/30/2021   10:53 AM  Fall Risk   Falls in the past year? _0 Number falls in past yr: 1 0 1 1 0  Injury with Fall? 0 _1 0  Risk for fall due to : History of fall(s);Impaired balance/gait Impaired balance/gait;History of fall(s)  Impaired balance/gait History of fall(s);Impaired balance/gait History of fall(s);Impaired balance/gait  Follow up Falls evaluation completed;Falls prevention discussed Falls evaluation completed Falls evaluation completed;Falls prevention discussed Falls evaluation completed Falls evaluation completed;Falls prevention discussed    FALL RISK PREVENTION PERTAINING TO THE HOME:  Any stairs in or around the home? Yes  If so, are there any without handrails? No  Home free of loose throw rugs in walkways, pet beds, electrical cords, etc? Yes  Adequate lighting in your home to reduce risk of falls? Yes   ASSISTIVE DEVICES UTILIZED TO PREVENT FALLS:  Life  alert? No  Use of a cane, walker or w/c? Yes  Grab bars in the bathroom? Yes  Shower chair or bench in shower? Yes  Elevated toilet seat or a handicapped toilet? No   TIMED UP AND GO:  Was the test performed? No .  Length of time to ambulate 10 feet: n/a sec.   Gait steady and fast without use of assistive device  Cognitive Function:        Immunizations Immunization History  Administered Date(s) Administered   Fluad Quad(high Dose 65+) 02/09/2021, 03/28/2022   Influenza Split 04/12/2011, 01/18/2012   Influenza Whole 02/12/2006, 02/06/2007, 02/13/2008, 02/25/2009, 03/15/2010   Influenza,inj,Quad PF,6+ Mos 02/03/2013, 02/13/2014, 01/06/2016, 01/25/2017, 01/31/2018, 02/10/2020   Influenza-Unspecified 03/10/2015, 02/11/2019   PFIZER Comirnaty(Gray Top)Covid-19 Tri-Sucrose Vaccine 08/12/2020   PFIZER(Purple Top)SARS-COV-2 Vaccination 07/04/2019, 07/29/2019   Pfizer Covid-19 Vaccine Bivalent Booster 36yr & up 02/24/2021   Pneumococcal Conjugate-13 10/07/2015   Pneumococcal Polysaccharide-23 02/12/2006, 02/25/2009, 03/19/2014   Tdap 08/29/2021   Vaccinia,smallpox Monkeypox Vaccine Live,pf 01/14/2021    TDAP status: Up to date  Flu Vaccine status: Up to date  Pneumococcal vaccine status: Up to date  Covid-19 vaccine status: Completed vaccines  Qualifies for Shingles Vaccine? Yes   Zostavax completed No   Shingrix Completed?: No.    Education has been provided regarding the importance of this vaccine. Patient has been advised to call insurance company to determine out of pocket expense if they have not yet received this vaccine. Advised may also receive vaccine at local pharmacy or Health Dept. Verbalized acceptance and understanding.  Screening Tests Health Maintenance  Topic Date Due   Zoster Vaccines- Shingrix (1 of 2) Never done   Diabetic kidney evaluation - Urine ACR  03/16/2016   COLONOSCOPY (Pts 45-453yrInsurance coverage will need to be confirmed)  05/15/2021    FOOT EXAM  08/30/2021   OPHTHALMOLOGY EXAM  10/05/2021   COVID-19 Vaccine (5 - 2023-24 season) 01/06/2022   HEMOGLOBIN A1C  06/28/2022   LIPID PANEL  08/17/2022   Diabetic kidney evaluation - GFR measurement  11/23/2022   Medicare Annual Wellness (AWV)  04/05/2023   Pneumonia Vaccine 6560Years old  Completed   INFLUENZA VACCINE  Completed   Hepatitis C Screening  Completed   HPV VACCINES  Aged Out    Health Maintenance  Health Maintenance Due  Topic Date Due   Zoster Vaccines- Shingrix (1 of 2) Never done   Diabetic kidney evaluation - Urine ACR  03/16/2016   COLONOSCOPY (Pts 45-4923yrnsurance coverage will need to be confirmed)  05/15/2021   FOOT EXAM  08/30/2021   OPHTHALMOLOGY EXAM  10/05/2021   COVID-19 Vaccine (5 - 2023-24 season) 01/06/2022    Colorectal cancer screening: Type of screening: Colonoscopy. Completed 05/15/2018. Repeat every 3 years  Lung Cancer Screening: (Low Dose CT Chest recommended if Age 22-4-80ars, 30 pack-year currently smoking OR have quit w/in 15years.) does not  qualify.   Lung Cancer Screening Referral: Defer to PCP.   Additional Screening:  Hepatitis C Screening: does qualify; Completed 03/17/2015  Vision Screening: Recommended annual ophthalmology exams for early detection of glaucoma and other disorders of the eye. Is the patient up to date with their annual eye exam?  Yes  Who is the provider or what is the name of the office in which the patient attends annual eye exams? Defer to PCP.  If pt is not established with a provider, would they like to be referred to a provider to establish care? No .   Dental Screening: Recommended annual dental exams for proper oral hygiene  Community Resource Referral / Chronic Care Management: CRR required this visit?  No   CCM required this visit?  No      Plan:     I have personally reviewed and noted the following in the patient's chart:   Medical and social history Use of alcohol, tobacco or  illicit drugs  Current medications and supplements including opioid prescriptions. Patient is not currently taking opioid prescriptions. Functional ability and status Nutritional status Physical activity Advanced directives List of other physicians Hospitalizations, surgeries, and ER visits in previous 12 months Vitals Screenings to include cognitive, depression, and falls Referrals and appointments  In addition, I have reviewed and discussed with patient certain preventive protocols, quality metrics, and best practice recommendations. A written personalized care plan for preventive services as well as general preventive health recommendations were provided to patient.     Tashika Goodin, CMA   04/04/2022   Nurse Notes: Face to Face.   Mr. Sessler , Thank you for taking time to come for your Medicare Wellness Visit. I appreciate your ongoing commitment to your health goals. Please review the following plan we discussed and let me know if I can assist you in the future.   These are the goals we discussed:  Goals      Blood Pressure < 140/90     HDL > 40     HEMOGLOBIN A1C < 7.0     HEMOGLOBIN A1C < 8.0     LDL CALC < 100     LDL CALC < 100     Prevent Falls        This is a list of the screening recommended for you and due dates:  Health Maintenance  Topic Date Due   Zoster (Shingles) Vaccine (1 of 2) Never done   Yearly kidney health urinalysis for diabetes  03/16/2016   Colon Cancer Screening  05/15/2021   Complete foot exam   08/30/2021   Eye exam for diabetics  10/05/2021   COVID-19 Vaccine (5 - 2023-24 season) 01/06/2022   Hemoglobin A1C  06/28/2022   Lipid (cholesterol) test  08/17/2022   Yearly kidney function blood test for diabetes  11/23/2022   Medicare Annual Wellness Visit  04/05/2023   Pneumonia Vaccine  Completed   Flu Shot  Completed   Hepatitis C Screening: USPSTF Recommendation to screen - Ages 18-79 yo.  Completed   HPV Vaccine  Aged Out

## 2022-04-04 NOTE — Assessment & Plan Note (Signed)
Patient being followed by [facility] endocrinology for Diabetes. Recent A1c A1c 9.1, which was higher than prior. His medication was adjusted so that Patient would take Toujeo 60 units daily again. He has continued Ozempic '1mg'$  weekly, Jardiance 25 mg daily, and Humalog 20 with meals. Patient still reports fecal incontinence, initially thought to be in the setting of Ozempic but patient has been off of Ozempic twice without resolution. He has also completed full infectious work up. I am concerned this is likely in the setting of pancreatic insufficiency, so will send for Fecal elastase. Plan -Foot exam today; while there were no ulcerations on exam, patient has severe reduction in sensation of his L foot to the mid plantar and dorsum. Given hx of amputation, will refer to podiatry for care and frequent evaluation. - Fecal elastase -B12 level -Diabetes Education referral -Retinal eye exam today -Continue medications as detailed above

## 2022-04-05 ENCOUNTER — Other Ambulatory Visit: Payer: Medicare (Managed Care)

## 2022-04-05 ENCOUNTER — Other Ambulatory Visit: Payer: Self-pay | Admitting: Student

## 2022-04-05 DIAGNOSIS — E1169 Type 2 diabetes mellitus with other specified complication: Secondary | ICD-10-CM

## 2022-04-05 DIAGNOSIS — Z794 Long term (current) use of insulin: Secondary | ICD-10-CM | POA: Diagnosis not present

## 2022-04-05 LAB — VITAMIN B12: Vitamin B-12: 382 pg/mL (ref 232–1245)

## 2022-04-05 NOTE — Progress Notes (Signed)
Internal Medicine Clinic Attending  I saw and evaluated the patient.  I personally confirmed the key portions of the history and exam documented by the resident  and I reviewed pertinent patient test results.  The assessment, diagnosis, and plan were formulated together and I agree with the documentation in the resident's note.  

## 2022-04-06 ENCOUNTER — Telehealth: Payer: Self-pay

## 2022-04-06 ENCOUNTER — Other Ambulatory Visit: Payer: Self-pay | Admitting: Student

## 2022-04-06 DIAGNOSIS — Z794 Long term (current) use of insulin: Secondary | ICD-10-CM

## 2022-04-06 MED ORDER — VITAMIN B-12 1000 MCG PO TABS: 1000.0000 ug | ORAL_TABLET | Freq: Every day | ORAL | 2 refills | Status: AC

## 2022-04-06 NOTE — Telephone Encounter (Addendum)
Only B12 has resulted. Per lab, fecal test will not result for 4-6 days. Patient notified and is very Patent attorney.

## 2022-04-06 NOTE — Telephone Encounter (Signed)
Requesting lab results, please call pt back.  

## 2022-04-12 LAB — PANCREATIC ELASTASE, FECAL: Pancreatic Elastase, Fecal: <50 ug Elast./g — ABNORMAL LOW (ref >200)

## 2022-04-13 NOTE — Progress Notes (Signed)
Internal Medicine Clinic Attending  Case and documentation reviewed.  I reviewed the AWV findings.  I agree with the assessment, diagnosis, and plan of care documented in the AWV note.     

## 2022-04-15 ENCOUNTER — Other Ambulatory Visit: Payer: Self-pay | Admitting: Internal Medicine

## 2022-04-15 DIAGNOSIS — E1151 Type 2 diabetes mellitus with diabetic peripheral angiopathy without gangrene: Secondary | ICD-10-CM

## 2022-04-17 ENCOUNTER — Other Ambulatory Visit: Payer: Self-pay | Admitting: Student

## 2022-04-17 DIAGNOSIS — K8689 Other specified diseases of pancreas: Secondary | ICD-10-CM

## 2022-04-17 MED ORDER — PANCRELIPASE (LIP-PROT-AMYL) 36000-114000 UNITS PO CPEP: ORAL_CAPSULE | ORAL | 11 refills | Status: DC

## 2022-04-17 NOTE — Progress Notes (Signed)
Called patient  Fecal elastase <50 diagnostic for pancreatic insufficiency, which has been documented in patients with long standing, insulin-dependent diabetes type 2 patients. Given diarrhea and symptom presentation, land unremarkable physical exam and laboratory findings for the past year, low risk for other causes of pancreatic insufficiency at this time.   Will trial wight-based Creon 2x 3 6K units with meals and 1x 36K units with snacks. Patient is to return to clinic in 4 weeks to monitor progress.  Spoke with patient and he agrees with plan.

## 2022-04-20 ENCOUNTER — Encounter (INDEPENDENT_AMBULATORY_CARE_PROVIDER_SITE_OTHER): Payer: Medicare (Managed Care) | Admitting: Dietician

## 2022-04-20 DIAGNOSIS — E119 Type 2 diabetes mellitus without complications: Secondary | ICD-10-CM

## 2022-04-20 DIAGNOSIS — Z794 Long term (current) use of insulin: Secondary | ICD-10-CM

## 2022-04-20 DIAGNOSIS — E1142 Type 2 diabetes mellitus with diabetic polyneuropathy: Secondary | ICD-10-CM

## 2022-04-20 NOTE — Progress Notes (Signed)
Retinal results were abstracted into patient's chart. See Ophthalmology section of Health Maintenance section of his chart for details  [name], RD 04/20/2022 1:40 PM.

## 2022-04-25 ENCOUNTER — Encounter: Payer: Medicare (Managed Care) | Admitting: Dietician

## 2022-04-25 ENCOUNTER — Telehealth: Payer: Self-pay | Admitting: Dietician

## 2022-04-25 ENCOUNTER — Encounter: Payer: Self-pay | Admitting: Dietician

## 2022-04-25 NOTE — Telephone Encounter (Signed)
Rescheduled missed appointment. He asks for a reminder call.

## 2022-04-27 ENCOUNTER — Ambulatory Visit (INDEPENDENT_AMBULATORY_CARE_PROVIDER_SITE_OTHER): Payer: Medicare (Managed Care) | Admitting: Dietician

## 2022-04-27 ENCOUNTER — Encounter: Payer: Self-pay | Admitting: Dietician

## 2022-04-27 ENCOUNTER — Other Ambulatory Visit: Payer: Self-pay | Admitting: Internal Medicine

## 2022-04-27 DIAGNOSIS — E119 Type 2 diabetes mellitus without complications: Secondary | ICD-10-CM

## 2022-04-27 DIAGNOSIS — I739 Peripheral vascular disease, unspecified: Secondary | ICD-10-CM

## 2022-04-27 NOTE — Progress Notes (Signed)
Diabetes Self-Management Education  Visit Type: Annual Follow-Upannual follow up  Appt. Start Time: 1000 Appt. End Time: 1100  04/27/2022  [name], identified by name and date of birth, is a 71 y.o. male with a diagnosis of Diabetes:  .   ASSESSMENT  There were no vitals taken for this visit. There is no height or weight on file to calculate BMI.   Diabetes Self-Management Education - 04/27/22 1200       Visit Information   Visit Type Annual Follow-Up      Health Coping   How would you rate your overall health? Fair   has diarrhea     Psychosocial Assessment   Patient Belief/Attitude about Diabetes Motivated to manage diabetes    What is the hardest part about your diabetes right now, causing you the most concern, or is the most worrisome to you about your diabetes?   Taking/obtaining medications;Checking blood sugar;Getting support / problem solving    Self-care barriers Lack of material resources    Self-management support Doctor's office;CDE visits    Other persons present --   no   Patient Concerns Medication;Monitoring;Problem Solving;Glycemic Control    Special Needs None    Preferred Learning Style No preference indicated    Learning Readiness Ready    How often do you need to have someone help you when you read instructions, pamphlets, or other written materials from your doctor or pharmacy? 2 - Rarely    What is the last grade level you completed in school? 12      Pre-Education Assessment   Patient understands the diabetes disease and treatment process. Comprehends key points    Patient understands incorporating nutritional management into lifestyle. Comprehends key points    Patient undertands incorporating physical activity into lifestyle. Comprehends key points    Patient understands using medications safely. Needs Review   he had questions about creon and b12   Patient understands monitoring blood glucose, interpreting and using results Needs  Review    Patient understands prevention, detection, and treatment of acute complications. Needs Review    Patient understands prevention, detection, and treatment of chronic complications. Compreheands key points    Patient understands how to develop strategies to address psychosocial issues. Demonstrates understanding / competency    Patient understands how to develop strategies to promote health/change behavior. Demonstrates understanding / competency      Complications   Last HgB A1C per patient/outside source 9.2 %    How often do you check your blood sugar? 3-4 times/day    Fasting Blood glucose range (mg/dL) 130-179    Postprandial Blood glucose range (mg/dL) >200    Number of hypoglycemic episodes per month 1    Can you tell when your blood sugar is low? Yes   sweaty   What do you do if your blood sugar is low? eats a lot of sweet foods    Number of hyperglycemic episodes ( >'200mg'$ /dL): Daily    Can you tell when your blood sugar is high? No    Have you had a dilated eye exam in the past 12 months? Yes    Have you had a dental exam in the past 12 months? Yes    Are you checking your feet? Yes    How many days per week are you checking your feet? 7      Dietary Intake   Breakfast fruity pebbles and whole milk, 2 c coffee      Activity / Exercise  Activity / Exercise Type Light (walking / raking leaves);ADL's    How many days per week do you exercise? 20    How many minutes per day do you exercise? 7    Total minutes per week of exercise 140      Patient Education   Previous Diabetes Education Yes (please comment)   here   Medications Reviewed patients medication for diabetes, action, purpose, timing of dose and side effects.;Taught/reviewed insulin/injectables, injection, site rotation, insulin/injectables storage and needle disposal.    Monitoring Taught/evaluated CGM (comment);Identified appropriate SMBG and/or A1C goals.    Acute complications Taught prevention, symptoms,  and  treatment of hypoglycemia - the 15 rule.      Individualized Goals (developed by patient)   Medications take my medication as prescribed      Outcomes   Expected Outcomes Demonstrated interest in learning. Expect positive outcomes    Future DMSE PRN   once he gets his CGM equipment   Program Status Not Completed      Subsequent Visit   Since your last visit have you continued or begun to take your medications as prescribed? Yes    Since your last visit have you had your blood pressure checked? Yes    Is your most recent blood pressure lower, unchanged, or higher since your last visit? Unchanged    Since your last visit have you experienced any weight changes? No change    Since your last visit, are you checking your blood glucose at least once a day? Yes             Individualized Plan for Diabetes Self-Management Training:   Learning Objective:  Patient will have a greater understanding of diabetes self-management. Patient education plan is to attend individual and/or group sessions per assessed needs and concerns.   Plan:   Patient Instructions   Thank you for your visit today!  To help your diabetes and per your request:  I can ask your doctor for prescriptions for 90 days for the [facility] 3 sensors and a reader   I will ask your doctor for prescription for 90 days for the Cecur simplicity and Humalog vials to fill it.   We talked about pinching up before you do your injections.   We can schedule a follow up once we see how your insurance will cover the item above.   [name] [phone]             Expected Outcomes:  Demonstrated interest in learning. Expect positive outcomes  Education material provided: Diabetes Resources  If problems or questions, patient to contact team via:  Phone  Future DSME appointment: PRN (once he gets his CGM equipment)   Assessment:  Primary concerns today: [name] wants help lowering his blood  sugars.  He says they have been higher than desired. We discussed his blood sugar/A1c goals today. He would like his A1c to be in the 7-8 range and blood sugars 150-180 mg/dL. He states he has has symptomatic hypoglycemia down to 55 mg/dL and was unable to identify the causes. He is thinking about changing his diet/food choices, but he did not mention anything specific today. His weight is stable and appropriate for his age.  He states " I am hungry all the time."  ANTHROPOMETRICS: he is not concerned today about his wight so we opted to not weigh.  Estimated body mass index is 24.14 kg/m as calculated from the following:   Height as of 04/04/22: 6'  1" (1.854 m).   Weight as of 04/04/22: 183 lb (83 kg).  WEIGHT HISTORY:  Wt Readings from Last 10 Encounters:  04/04/22 183 lb (83 kg)  04/04/22 183 lb 4.8 oz (83.1 kg)  03/28/22 187 lb (84.8 kg)  12/06/21 184 lb (83.5 kg)  10/17/21 181 lb (82.1 kg)  09/09/21 182 lb 9.6 oz (82.8 kg)  08/29/21 188 lb (85.3 kg)  08/25/21 185 lb (83.9 kg)  05/30/21 187 lb 4.8 oz (85 kg)  04/04/21 179 lb 8 oz (81.4 kg)   SLEEP:not addressed today  MEDICATIONS: he brought humalog u200 pen(24-25 Humalog 8 am , 12-1PM and 6-7 PM, Toujeo u200 pen,(Toujeo- 8 am 60 units),    jardiance, creon and B12. We discussed each one.  He does not know anything about actos. He has ozempic pen at home and says he takes 1 mg weekly. His abdomen was assessed for lipohypertrophy. None noted today. He says once he began using the Cecur simplicity, the lumps went away. He stopped using the cecur simplicity insulin patch to administer his prandial insulin when his insurance stopped paying for it. His a1cs dropped to 7-8% from >10% while using the cecur simplicity insulin patch. He states he was much better at taking his insulin and also better at taking it 15-30 minutes before eating and it helped lower his blood sugars more than when the Humalog is administered with the pen. He states he has  never used syringe and vials.  He is advised to use the pinch up method since when using insulin pens because he is thin and has limited adipose tissue and some sporadic low blood sugars with unknown causes.   BLOOD SUGAR:Meter freestyle was downloaded. Battery is low. He is unhappy with it but strips are preferred by his insurance. Gave him a freestyle freedom meter today  METER DOWNLOAD  Report summary is from last 30 days,  Average tests per day: 1.9 Average blood glucose: 196 Range: minimum: 91 and maximum: 369 Days without test: 0 % in target range: 44 % below target range: 0 % above target range: 56 % hypoglycemia: 0 Notes about patterns: modal day shows in target fasting and gradual and steady rise throughout the day to ~ 250 on average in the evening Lab Results  Component Value Date   HGBA1C 9.2 (A) 03/28/2022   HGBA1C 7.2 (A) 08/29/2021   HGBA1C 7.6 (A) 05/30/2021   HGBA1C 10.5 (A) 02/09/2021   HGBA1C 8.9 (A) 08/30/2020      DIETARY INTAKE: Usual eating pattern includes 3 meals and 2-3 snacks per day.  Everyday foods include whole milk, cereal, bananas, cookies  Nausea: no,  Vomiting: no, Diarrhea: yes, Constipation: no Dining Out (times/week): 1-2 24-hr recall:  B ( AM): fruity pebbles 1-2 cups, whole milk, 2 cups coffee with powdered non -dairy creamer  Usual physical activity: adls and light walking  Action Goal: use pinch up technique, keeps strips in bottle  Outcome goal:  Coordination of care: request prescriptions per patient instructions  Teaching Method Utilized: Visual, Auditory,Hands on Handouts given during visit include: After visit summary Barriers to learning/adherence to lifestyle change: competing values Demonstrated degree of understanding via:  Teach Back  Dietary intake, exercise, meter, cgm supplies as appropriate, and body weight prn. [name], RD 04/27/2022 12:47 PM.

## 2022-04-27 NOTE — Patient Instructions (Signed)
  Thank you for your visit today!  To help your diabetes and per your request:  I can ask your doctor for prescriptions for 90 days for the [facility] 3 sensors and a reader   I will ask your doctor for prescription for 90 days for the Cecur simplicity and Humalog vials to fill it.   We talked about pinching up before you do your injections.   We can schedule a follow up once we see how your insurance will cover the item above.   [name] [phone]

## 2022-05-04 ENCOUNTER — Other Ambulatory Visit: Payer: Self-pay | Admitting: Dietician

## 2022-05-04 DIAGNOSIS — E1169 Type 2 diabetes mellitus with other specified complication: Secondary | ICD-10-CM

## 2022-05-04 MED ORDER — CEQUR SIMPLICITY 2U DEVI: 3 refills | Status: DC

## 2022-05-04 MED ORDER — FREESTYLE LIBRE 3 SENSOR MISC: 3 refills | Status: DC

## 2022-05-04 MED ORDER — INSULIN LISPRO 100 UNIT/ML IJ SOLN: INTRAMUSCULAR | 3 refills | Status: DC

## 2022-05-04 MED ORDER — CEQUR SIMPLICITY INSERTER MISC: 1 refills | Status: DC

## 2022-05-04 MED ORDER — FREESTYLE LIBRE 3 READER DEVI: 1.0000 | 0 refills | Status: DC

## 2022-05-04 NOTE — Telephone Encounter (Signed)
Nathan [name] requested the following prescriptions at his visit with me last week: Freestyle Libre 3 and reader, Cecur simplicity insulin patch and Humalog vials to use with his Cecur simplicity insulin patch.

## 2022-05-10 ENCOUNTER — Telehealth: Payer: Self-pay | Admitting: Dietician

## 2022-05-10 NOTE — Telephone Encounter (Signed)
Per pharmacy [facility] reader rejected, Cecur went through for $11.20; and 6 FL3 sensors picked up today [phone] for PA for reader;  denied under part D, leaning towards processing under part B,   New case [id number]- we should receive a fax today about if it is approved to run under part B.

## 2022-05-11 NOTE — Telephone Encounter (Signed)
Spoke with pharmacy and [place], the [facility] reader is still not going through per pharmacy with a rejection that [facility] is incorrect. Faxd PA for reader to [facility]. Call from [place] from [place]; the [employer] 3 reader has been approved for Mr. Taino Maertens. Ref #[id number]   Approved from 05-10-2022 to 08-09-2022

## 2022-05-17 NOTE — Telephone Encounter (Signed)
Call from patient, reader is still not going through. Asked pharmacy to rerun, she got a rejection, but then changed the day supply from 1 to 30 and it went through. Patient notified.

## 2022-05-24 ENCOUNTER — Ambulatory Visit (HOSPITAL_COMMUNITY): Payer: Medicare (Managed Care) | Attending: Internal Medicine

## 2022-05-29 ENCOUNTER — Other Ambulatory Visit: Payer: Self-pay | Admitting: Internal Medicine

## 2022-05-29 DIAGNOSIS — Z794 Long term (current) use of insulin: Secondary | ICD-10-CM

## 2022-05-30 ENCOUNTER — Encounter: Payer: Self-pay | Admitting: Gastroenterology

## 2022-06-01 ENCOUNTER — Ambulatory Visit: Payer: Medicare (Managed Care) | Admitting: Dietician

## 2022-06-01 ENCOUNTER — Encounter: Payer: Self-pay | Admitting: Internal Medicine

## 2022-06-01 ENCOUNTER — Ambulatory Visit (INDEPENDENT_AMBULATORY_CARE_PROVIDER_SITE_OTHER): Payer: Medicare (Managed Care) | Admitting: Internal Medicine

## 2022-06-01 VITALS — BP 122/69 | HR 66 | Temp 98.0°F | Wt 184.8 lb

## 2022-06-01 DIAGNOSIS — E1151 Type 2 diabetes mellitus with diabetic peripheral angiopathy without gangrene: Secondary | ICD-10-CM

## 2022-06-01 DIAGNOSIS — Z794 Long term (current) use of insulin: Secondary | ICD-10-CM | POA: Diagnosis not present

## 2022-06-01 DIAGNOSIS — E1142 Type 2 diabetes mellitus with diabetic polyneuropathy: Secondary | ICD-10-CM

## 2022-06-01 DIAGNOSIS — I10 Essential (primary) hypertension: Secondary | ICD-10-CM

## 2022-06-01 DIAGNOSIS — Z0000 Encounter for general adult medical examination without abnormal findings: Secondary | ICD-10-CM

## 2022-06-01 DIAGNOSIS — Z87891 Personal history of nicotine dependence: Secondary | ICD-10-CM

## 2022-06-01 DIAGNOSIS — Z1211 Encounter for screening for malignant neoplasm of colon: Secondary | ICD-10-CM

## 2022-06-01 DIAGNOSIS — K8689 Other specified diseases of pancreas: Secondary | ICD-10-CM | POA: Diagnosis not present

## 2022-06-01 NOTE — Patient Instructions (Signed)
Dear Nathan [name],  Thank you for trusting [place] with your care today. We discussed your diarrhea. I am glad it is resolving. We would like for you to also see a GI doctor who can help optimize your treatment for the pancreatic insufficiency. We would also like for you to see a GI doctor to get a colonoscopy done since you are due for one.  To keep an eye on your diabetes, we also would like to make sure that your kidneys are doing well. To do this, we check a urine test yearly.   Please return in 6 months for a follow up.

## 2022-06-01 NOTE — Progress Notes (Signed)
   CC: pancreatic insufficiency  HPI:[name] is a 72 y.o. male who presents for evaluation of pancreatic insufficiency. Please see individual problem based A/P for details.  Depression, PHQ-9: Based on the patients  [place] Visit from 08/29/2021 in [place]  PHQ-9 Total Score 0      score we have .  Past Medical History:  Diagnosis Date   Asthma    per 2003 [facility] pulm records pfts    Blood dyscrasia    HIV   Clotting disorder ([place])    Colon polyps    noted previous colonoscopy [facility]   DDD (degenerative disc disease)    cervical spine   Depression    Diabetes mellitus    dx 2010   Diarrhea 02/24/2021   Dizziness 08/25/2021   Falls 08/25/2021   Fever    unknwon origin   GERD (gastroesophageal reflux disease)    Head swelling 05/23/2017   Hep C w/o coma, chronic (HCC)    History of syphilis    noted [facility] records   HIV infection ([place])    undetectable viral load and CD4 ct 667 as of 11/2011   Hyperlipidemia    Hypertension    MRSA (methicillin resistant Staphylococcus aureus)    Multiple sclerosis ([place])    in remission as of 11/2011 (diagnosed late 1980s)   Pain in limb-Right Leg 10/13/2013   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  No murmurs, rubs, or gallops Pulmonary : Equal breath sounds, No wheezes, rales, or rhonchi Abdominal: soft, nontender,  bowel  sounds present Ext: No edema in lower extremities, no tenderness to palpation of lower extremities.   Assessment & Plan:   See Encounters Tab for problem based charting.  Patient discussed with Dr. Philipp Ovens

## 2022-06-01 NOTE — Progress Notes (Signed)
   PDiabetes Self-Management Education  Visit Type:  Follow-up  Appt. Start Time: 1.15 Appt. End Time: 1:50  06/01/2022  [name], identified by name and date of birth, is a 72 y.o. male with a diagnosis of Diabetes:  .   ASSESSMENT   Today's visit just for teaching how to use an put himself a CGM. He left the office with sensor on warm mode.     Diabetes Self-Management Education - 06/01/22 1400       Health Coping   How would you rate your overall health? Fair      Patient Education   Monitoring Taught/evaluated CGM (comment)      Individualized Goals (developed by patient)   Medications take my medication as prescribed    Monitoring  Consistenly use CGM      Patient Self-Evaluation of Goals - Patient rates self as meeting previously set goals (% of time)   Medications --   Needs to adress in his next visit     Outcomes   Program Status Not Completed      Subsequent Visit   Since your last visit have you continued or begun to take your medications as prescribed? Yes    Since your last visit have you had your blood pressure checked? Yes    Is your most recent blood pressure lower, unchanged, or higher since your last visit? Higher   Sistolic is higher   Since your last visit have you experienced any weight changes? No change    Since your last visit, are you checking your blood glucose at least once a day? Yes             Learning Objective:  Patient will have a greater understanding of diabetes self-management. Patient education plan is to attend individual and/or group sessions per assessed needs and concerns.   Plan:   Patient Instructions  Thank you for your visit today!   You did great placing your FREESTYLE LIBRE 3 sensor and starting it today!  Please put [employer]'s number in a place where you will remember:  [phone]  Please make a follow up in 2-3 weeks. If you want me to be present when you change it then on February 6th. If you think  you can change it on your own then 3 weeks is fine.   Please feel free to call me anytime.  [name] [phone]    Expected Outcomes:  Demonstrated interest in learning. Expect positive outcomes  Education material provided: Diabetes Resources  If problems or questions, patient to contact team via:  Phone  Future DSME appointment: - 2 wks [name], RD 06/01/2022 2:51 PM.

## 2022-06-01 NOTE — Patient Instructions (Addendum)
Thank you for your visit today!   You did great placing your FREESTYLE LIBRE 3 sensor and starting it today!  Please put [employer]'s number in a place where you will remember:  [phone]  Please make a follow up in 2-3 weeks. If you want me to be present when you change it then on February 6th. If you think you can change it on your own then 3 weeks is fine.   Please feel free to call me anytime.  [name] [phone]

## 2022-06-02 LAB — MICROALBUMIN / CREATININE URINE RATIO
Creatinine, Urine: 47.4 mg/dL
Microalb/Creat Ratio: 25 mg/g creat (ref 0–29)
Microalbumin, Urine: 11.9 ug/mL

## 2022-06-04 DIAGNOSIS — K8689 Other specified diseases of pancreas: Secondary | ICD-10-CM | POA: Insufficient documentation

## 2022-06-04 NOTE — Assessment & Plan Note (Signed)
Takes lisinopril. Some diuretic effect with empagliflozin as well.   BP controlled today with repeat. No change to regimen.

## 2022-06-04 NOTE — Assessment & Plan Note (Addendum)
Follows with Dr. Kelton Pillar as well as [name] [facility] diabetic education. Has appt with [name] in 3 weeks. Recently got CGM which he has been using. He has restarted ozempic after it was discontinued when his diarrhea was believed to be related to medication side effect. Doing well with ozempic.   Urine ACR was checked and wnl. Continue current regimen

## 2022-06-04 NOTE — Assessment & Plan Note (Signed)
Patient with recently diagnosed pancreatic insufficiency after after chronic diarrhea. He was started on creon and reports improvement with this medication. Previously diarrhea with most bowel movements. No having solid stools majority of time. He is no longer having any incontinence related to diarrhea. He denies bloating, gas pain. Appetite doing well.  Abd is soft, positive bowel sounds. Non-tender.   He has demonstrated improvement with chronic treatment. We will continue his creon for now. He would likely benefit from additional management from GI for optimization of his treatment plan. Will refer to GI for assistance as well.

## 2022-06-04 NOTE — Assessment & Plan Note (Signed)
Patient reports he is scheduled for GI appt to prepare for colonoscopy.

## 2022-06-05 ENCOUNTER — Encounter: Payer: Self-pay | Admitting: Internal Medicine

## 2022-06-06 ENCOUNTER — Other Ambulatory Visit: Payer: Self-pay

## 2022-06-06 ENCOUNTER — Other Ambulatory Visit: Payer: Medicare (Managed Care)

## 2022-06-06 DIAGNOSIS — B20 Human immunodeficiency virus [HIV] disease: Secondary | ICD-10-CM

## 2022-06-07 LAB — T-HELPER CELL (CD4) - (RCID CLINIC ONLY)
CD4 % Helper T Cell: 19 % — ABNORMAL LOW (ref 33–65)
CD4 T Cell Abs: 685 /uL (ref 400–1790)

## 2022-06-08 LAB — CBC WITH DIFFERENTIAL/PLATELET
Absolute Monocytes: 353 cells/uL (ref 200–950)
Basophils Absolute: 38 cells/uL (ref 0–200)
Basophils Relative: 0.6 %
Eosinophils Absolute: 208 cells/uL (ref 15–500)
Eosinophils Relative: 3.3 %
HCT: 46.6 % (ref 38.5–50.0)
Hemoglobin: 16.1 g/dL (ref 13.2–17.1)
Lymphs Abs: 3320 cells/uL (ref 850–3900)
MCH: 31.9 pg (ref 27.0–33.0)
MCHC: 34.5 g/dL (ref 32.0–36.0)
MCV: 92.3 fL (ref 80.0–100.0)
MPV: 8.9 fL (ref 7.5–12.5)
Monocytes Relative: 5.6 %
Neutro Abs: 2381 cells/uL (ref 1500–7800)
Neutrophils Relative %: 37.8 %
Platelets: 223 10*3/uL (ref 140–400)
RBC: 5.05 10*6/uL (ref 4.20–5.80)
RDW: 12.4 % (ref 11.0–15.0)
Total Lymphocyte: 52.7 %
WBC: 6.3 10*3/uL (ref 3.8–10.8)

## 2022-06-08 LAB — COMPLETE METABOLIC PANEL WITH GFR
AG Ratio: 1.6 (calc) (ref 1.0–2.5)
ALT: 23 U/L (ref 9–46)
AST: 20 U/L (ref 10–35)
Albumin: 4.5 g/dL (ref 3.6–5.1)
Alkaline phosphatase (APISO): 65 U/L (ref 35–144)
BUN: 16 mg/dL (ref 7–25)
CO2: 25 mmol/L (ref 20–32)
Calcium: 9.7 mg/dL (ref 8.6–10.3)
Chloride: 104 mmol/L (ref 98–110)
Creat: 1.24 mg/dL (ref 0.70–1.28)
Globulin: 2.9 g/dL (calc) (ref 1.9–3.7)
Glucose, Bld: 304 mg/dL — ABNORMAL HIGH (ref 65–99)
Potassium: 4.5 mmol/L (ref 3.5–5.3)
Sodium: 138 mmol/L (ref 135–146)
Total Bilirubin: 0.5 mg/dL (ref 0.2–1.2)
Total Protein: 7.4 g/dL (ref 6.1–8.1)
eGFR: 61 mL/min/{1.73_m2} (ref >=60)

## 2022-06-08 LAB — LIPID PANEL
Cholesterol: 114 mg/dL (ref <200)
HDL: 37 mg/dL — ABNORMAL LOW (ref >=40)
LDL Cholesterol (Calc): 56 mg/dL (calc)
Non-HDL Cholesterol (Calc): 77 mg/dL (calc) (ref <130)
Total CHOL/HDL Ratio: 3.1 (calc) (ref <5.0)
Triglycerides: 119 mg/dL (ref <150)

## 2022-06-08 LAB — HIV-1 RNA QUANT-NO REFLEX-BLD
HIV 1 RNA Quant: NOT DETECTED Copies/mL
HIV-1 RNA Quant, Log: NOT DETECTED Log cps/mL

## 2022-06-08 LAB — RPR: RPR Ser Ql: NONREACTIVE

## 2022-06-12 NOTE — Addendum Note (Signed)
Addended by: Jodean Lima on: 06/12/2022 09:56 AM   Modules accepted: Level of Service

## 2022-06-12 NOTE — Progress Notes (Signed)
Internal Medicine Clinic Attending ° °Case discussed with Dr. Gawaluck  At the time of the visit.  We reviewed the resident’s history and exam and pertinent patient test results.  I agree with the assessment, diagnosis, and plan of care documented in the resident’s note.  °

## 2022-06-20 ENCOUNTER — Encounter: Payer: Self-pay | Admitting: Gastroenterology

## 2022-06-20 ENCOUNTER — Ambulatory Visit (INDEPENDENT_AMBULATORY_CARE_PROVIDER_SITE_OTHER): Payer: Medicare (Managed Care) | Admitting: Gastroenterology

## 2022-06-20 ENCOUNTER — Telehealth: Payer: Medicare (Managed Care) | Admitting: Infectious Disease

## 2022-06-20 ENCOUNTER — Telehealth: Payer: Self-pay

## 2022-06-20 VITALS — BP 120/62 | HR 82 | Ht 73.0 in | Wt 183.0 lb

## 2022-06-20 DIAGNOSIS — E1142 Type 2 diabetes mellitus with diabetic polyneuropathy: Secondary | ICD-10-CM | POA: Diagnosis not present

## 2022-06-20 DIAGNOSIS — Z7902 Long term (current) use of antithrombotics/antiplatelets: Secondary | ICD-10-CM | POA: Insufficient documentation

## 2022-06-20 DIAGNOSIS — D126 Benign neoplasm of colon, unspecified: Secondary | ICD-10-CM | POA: Diagnosis not present

## 2022-06-20 DIAGNOSIS — Z794 Long term (current) use of insulin: Secondary | ICD-10-CM | POA: Diagnosis not present

## 2022-06-20 DIAGNOSIS — K529 Noninfective gastroenteritis and colitis, unspecified: Secondary | ICD-10-CM | POA: Diagnosis not present

## 2022-06-20 DIAGNOSIS — K8689 Other specified diseases of pancreas: Secondary | ICD-10-CM

## 2022-06-20 MED ORDER — PANCRELIPASE (LIP-PROT-AMYL) 36000-114000 UNITS PO CPEP: 144000.0000 [IU] | ORAL_CAPSULE | Freq: Three times a day (TID) | ORAL | 11 refills | Status: DC

## 2022-06-20 MED ORDER — CLENPIQ 10-3.5-12 MG-GM -GM/160ML PO SOLN: 320.0000 mL | Freq: Once | ORAL | 0 refills | Status: AC

## 2022-06-20 NOTE — Patient Instructions (Addendum)
If you are age 74 or older, your body mass index should be between 23-30. Your Body mass index is 24.14 kg/m. If this is out of the aforementioned range listed, please consider follow up with your Primary Care Provider.  If you are age 27 or younger, your body mass index should be between 19-25. Your Body mass index is 24.14 kg/m. If this is out of the aformentioned range listed, please consider follow up with your Primary Care Provider.   We have sent the following medications to your pharmacy for you to pick up at your convenience: Creon take 4 capsules with meals and two with snacks.  You have been scheduled for a colonoscopy. Please follow written instructions given to you at your visit today.  Please pick up your prep supplies at the pharmacy within the next 1-3 days. If you use inhalers (even only as needed), please bring them with you on the day of your procedure.   The  GI providers would like to encourage you to use [facility] to communicate with providers for non-urgent requests or questions.  Due to long hold times on the telephone, sending your provider a message by [facility] may be a faster and more efficient way to get a response.  Please allow 48 business hours for a response.  Please remember that this is for non-urgent requests.   It was a pleasure to see you today!  Thank you for trusting me with your gastrointestinal care!    Alonza Bogus, PA-C

## 2022-06-20 NOTE — Telephone Encounter (Signed)
   Nathan [name] 1950/07/01 [id number]  Dear Dr.Gawaluck:  We have scheduled the above named patient for a(n) Colonoscopy procedure. Our records show that (s)he is on anticoagulation therapy.  Please advise as to whether the patient may come off their therapy of Plavix 5 days prior to their procedure which is scheduled for 08/10/22.  Please route your response to [facility] or fax response to [phone].  Sincerely,    [facility]

## 2022-06-20 NOTE — Progress Notes (Signed)
06/20/2022 SEAVER FOSSETT [id number] October 07, 1950   HISTORY OF PRESENT ILLNESS: This is a 72 year old male who is a patient [name].  He has a history of colon polyps.  Last colonoscopy January 2020 at which time he had 4 polyps removed, some were adenomatous.  [name] recommended a repeat colonoscopy at a 3-year interval.  He is diabetic on insulin and other medications.  He has history of vascular disease and is on Plavix for several years.  This is prescribed by his PCP.  He tells me that he has had issues with chronic diarrhea for years.  Pancreatic fecal elastase was checked by his PCP in November and was less than 50.  They placed him on Creon 72,000 units with meals 36,000 units with snacks.  He says that he has been taking it, but has not really noticed any difference in his diarrhea.  He denies any rectal bleeding.   Past Medical History:  Diagnosis Date   Asthma    per 2003 [facility] pulm records pfts    Blood dyscrasia    HIV   Clotting disorder ([place])    Colon polyps    noted previous colonoscopy [facility]   DDD (degenerative disc disease)    cervical spine   Depression    Diabetes mellitus    dx 2010   Diarrhea 02/24/2021   Dizziness 08/25/2021   Falls 08/25/2021   Fever    unknwon origin   GERD (gastroesophageal reflux disease)    Head swelling 05/23/2017   Hep C w/o coma, chronic (HCC)    History of syphilis    noted [facility] records   HIV infection ([place])    undetectable viral load and CD4 ct 667 as of 11/2011   Hyperlipidemia    Hypertension    MRSA (methicillin resistant Staphylococcus aureus)    Multiple sclerosis ([place])    in remission as of 11/2011 (diagnosed late 1980s)   Pain in limb-Right Leg 10/13/2013   Pancreas (digestive gland) works poorly    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[place])    2002   Pneumonia    Prosthesis fitting 03/10/2019   PVD (peripheral vascular disease) ([place])    Left Stent 07/02/2008   Scalp lesion 07/26/2017   UTI (urinary  tract infection)    Wears dentures    Past Surgical History:  Procedure Laterality Date   ABDOMINAL AORTOGRAM W/LOWER EXTREMITY N/A 08/07/2017   Procedure: ABDOMINAL AORTOGRAM W/LOWER EXTREMITY;  Surgeon: Serafina Mitchell, MD;  Location: [facility];  Service: Cardiovascular;  Laterality: N/A;   ABOVE KNEE LEG AMPUTATION     Right Leg   COLONOSCOPY W/ BIOPSIES AND POLYPECTOMY     LOWER EXTREMITY ANGIOGRAM Left 12/26/2011   Procedure: LOWER EXTREMITY ANGIOGRAM;  Surgeon: Serafina Mitchell, MD;  Location: [facility] CATH LAB;  Service: Cardiovascular;  Laterality: Left;   LOWER EXTREMITY ANGIOGRAM Left 08/17/2017   Procedure: LOWER EXTREMITY ANGIOGRAM LEFT LEG WITH RUNOFF AND Stenting.;  Surgeon: Serafina Mitchell, MD;  Location: [facility] OR;  Service: Vascular;  Laterality: Left;   MULTIPLE TOOTH EXTRACTIONS     OTHER SURGICAL HISTORY     right lower ext AKA with prothesis    OTHER SURGICAL HISTORY     left left with stents (Dr. Seward Speck)   OTHER SURGICAL HISTORY     2003 colonoscopy 5 mm polyp transverse colon; (2)106m  polyps in rectum-hyperplastic   PERIPHERAL VASCULAR INTERVENTION Left 08/17/2017   Procedure: POPLITEAL STENT;  Surgeon: BTrula Slade  [name], MD;  Location: [facility];  Service: Vascular;  Laterality: Left;    reports that he has quit smoking. He has quit using smokeless tobacco.  His smokeless tobacco use included chew. He reports that he does not drink alcohol and does not use drugs. family history includes Cancer in his brother; Colon cancer in his brother; Coronary artery disease in his father and mother; Diabetes in his mother; Liver disease in his sister; Prostate cancer in his brother. Allergies  Allergen Reactions   Sulfonamide Derivatives Hives      Outpatient Encounter Medications as of 06/20/2022  Medication Sig   atorvastatin (LIPITOR) 40 MG tablet TAKE 1 TABLET(40 MG) BY MOUTH DAILY   bictegravir-emtricitabine-tenofovir AF (BIKTARVY) 50-200-25 MG TABS tablet Take 1  tablet by mouth daily.   Blood Glucose Monitoring Suppl (FREESTYLE LITE) w/Device KIT 1 Device by Does not apply route daily in the afternoon.   cilostazol (PLETAL) 50 MG tablet TAKE 1 TABLET(50 MG) BY MOUTH TWICE DAILY   clopidogrel (PLAVIX) 75 MG tablet Take 1 tablet (75 mg total) by mouth daily.   Continuous Blood Gluc Receiver (FREESTYLE LIBRE 3 READER) DEVI 1 each by Does not apply route as directed. Use  with freestyle libre 3 sensors to monitor blood sugar continuously   Continuous Blood Gluc Sensor (FREESTYLE LIBRE 3 SENSOR) MISC Place 1 sensor on the skin every 14 days. Use to check glucose continuously   cyanocobalamin (VITAMIN B12) 1000 MCG tablet Take 1 tablet (1,000 mcg total) by mouth daily.   diclofenac Sodium (VOLTAREN) 1 % GEL Apply 4 g topically 4 (four) times daily as needed.   empagliflozin (JARDIANCE) 25 MG TABS tablet Take 1 tablet (25 mg total) by mouth daily before breakfast.   ezetimibe (ZETIA) 10 MG tablet Take 1 tablet (10 mg total) by mouth daily.   glucose blood (FREESTYLE LITE) test strip Use 5 times daily to check blood sugar.   glucose blood (FREESTYLE LITE) test strip 1 each by Other route 3 (three) times daily. Use as instructed   ibuprofen (ADVIL) 400 MG tablet Take by mouth.   injection device for insulin (CEQUR SIMPLICITY 2U) DEVI Apply as directed once every 2 days   Injection Device for Insulin (CEQUR SIMPLICITY INSERTER) MISC Use to Apply a new cecur simplicity patch as directed every 2 days   insulin glargine, 2 Unit Dial, (TOUJEO MAX SOLOSTAR) 300 UNIT/ML Solostar Pen Inject 60 Units into the skin in the morning.   insulin lispro (HUMALOG KWIKPEN) 200 UNIT/ML KwikPen Max daily 100 units   insulin lispro (HUMALOG) 100 UNIT/ML injection To use with Cecur Simplicity insulin patch to take 20 units plus correction before meals 3 times daily with a maximum 100 units/day   Insulin Pen Needle (BD PEN NEEDLE NANO U/F) 32G X 4 MM MISC 1 Device by Other route in the  morning, at noon, in the evening, and at bedtime. USE AS DIRECTED FOUR TIMES DAILY. Dx code  E11.42   Lancet Devices (BAYER MICROLET 2 LANCING DEVIC) MISC Use to check blood sugar up to 3 ties a day   Lancets (FREESTYLE) lancets Use 5 times daily to check blood sugar.   lipase/protease/amylase (CREON) 36000 UNITS CPEP capsule Take 2 capsules (72,000 Units total) by mouth 3 (three) times daily with meals. May also take 1 capsule (36,000 Units total) as needed (with snacks).   lisinopril (ZESTRIL) 40 MG tablet TAKE 1 TABLET BY MOUTH DAILY. HOLD IF SYSTOLIC BLOOD PRESSURE IS LESS THAN 110  loperamide (IMODIUM) 2 MG capsule Take 1 capsule (2 mg total) by mouth as needed for diarrhea or loose stools.   omeprazole (PRILOSEC) 40 MG capsule TAKE 1 CAPSULE(40 MG) BY MOUTH DAILY   OZEMPIC, 1 MG/DOSE, 4 MG/3ML SOPN INJECT 1 MG UNDER THE SKIN ONCE A WEEK   pioglitazone (ACTOS) 30 MG tablet Take 30 mg by mouth daily.   No facility-administered encounter medications on file as of 06/20/2022.     REVIEW OF SYSTEMS  : All other systems reviewed and negative except where noted in the History of Present Illness.   PHYSICAL EXAM: BP 120/62   Pulse 82   Ht 6' 1"$  (1.854 m)   Wt 183 lb (83 kg)   SpO2 95%   BMI 24.14 kg/m  General: Well developed white male in no acute distress Head: Normocephalic and atraumatic Eyes:  Sclerae anicteric, conjunctiva pink. Ears: Normal auditory acuity Lungs: Clear throughout to auscultation; no W/R/R. Heart: Regular rate and rhythm; no M/R/G. Abdomen: Soft, non-distended.  BS present.  Non-tender. Rectal:  Will be done at the time of colonoscopy. Musculoskeletal: Symmetrical with no gross deformities  Skin: No lesions on visible extremities Extremities: No edema; right AKA Neurological: Alert oriented x 4, grossly non-focal Psychological:  Alert and cooperative. Normal mood and affect  ASSESSMENT AND PLAN: *Personal history of colon polyps: Last colonoscopy January  2020 with adenomatous polyps removed.  Repeat recommended at 3-year interval per [name].  Will schedule with Dr. Lyndel Safe in [name] absence. *Chronic diarrhea: Had a very low pancreatic fecal elastase in November.  Was started on Creon 72,000 units with meals and 36,000 units with snacks.  So far no improvement in symptoms.  Will increase the dosing, will double to 144,000 units with meals and 72000 units with snacks.  Please consider random biopsies during colonoscopy to also rule out microscopic colitis.  Also some of his diabetic medications may be contributing to his diarrhea. *Chronic antiplatelet use with Plavix:  Hold Plavix for 5 days before procedure - will instruct when and how to resume after procedure. Risks and benefits of procedure including bleeding, perforation, infection, missed lesions, medication reactions and possible hospitalization or surgery if complications occur explained. Additional rare but real risk of cardiovascular event such as heart attack or ischemia/infarct of other organs off of Plavix explained and need to seek urgent help if this occurs. Will communicate by phone or EMR with patient's prescribing provider to confirm that holding Plavix is reasonable in this case.   *IDDM:  Insulin will be adjusted prior to endoscopic procedure per protocol. Will resume normal dosing after procedure.    CC:  Delene Ruffini, MD

## 2022-06-22 ENCOUNTER — Other Ambulatory Visit: Payer: Self-pay | Admitting: Internal Medicine

## 2022-06-26 ENCOUNTER — Encounter: Payer: Medicare (Managed Care) | Admitting: Dietician

## 2022-06-27 ENCOUNTER — Telehealth: Payer: Self-pay | Admitting: Dietician

## 2022-06-27 NOTE — Telephone Encounter (Signed)
Called to reschedule missed appointment.Left voicemail for return call

## 2022-06-29 NOTE — Telephone Encounter (Signed)
Left VM for patient to return call regarding holding Plavix 5 days.

## 2022-06-29 NOTE — Telephone Encounter (Signed)
Patient returned call to confirm he received my VM . Patient also had a question on when his procedure was he thought it was 4/1 but it was scheduled for 4/4. I informed patient that he had an appointment with [facility] 4/1

## 2022-06-29 NOTE — Telephone Encounter (Signed)
Patient returned call

## 2022-07-01 NOTE — Progress Notes (Signed)
Agree with assessment/plan.  Raj Marci Polito, MD [facility] [phone]  

## 2022-07-04 ENCOUNTER — Other Ambulatory Visit: Payer: Self-pay

## 2022-07-04 ENCOUNTER — Other Ambulatory Visit: Payer: Self-pay | Admitting: *Deleted

## 2022-07-04 DIAGNOSIS — I70213 Atherosclerosis of native arteries of extremities with intermittent claudication, bilateral legs: Secondary | ICD-10-CM

## 2022-07-04 DIAGNOSIS — I6523 Occlusion and stenosis of bilateral carotid arteries: Secondary | ICD-10-CM

## 2022-07-04 DIAGNOSIS — E1169 Type 2 diabetes mellitus with other specified complication: Secondary | ICD-10-CM

## 2022-07-04 DIAGNOSIS — I739 Peripheral vascular disease, unspecified: Secondary | ICD-10-CM

## 2022-07-04 MED ORDER — EZETIMIBE 10 MG PO TABS: 10.0000 mg | ORAL_TABLET | Freq: Every day | ORAL | 2 refills | Status: DC

## 2022-07-13 ENCOUNTER — Ambulatory Visit (HOSPITAL_COMMUNITY): Payer: Medicare (Managed Care)

## 2022-07-17 ENCOUNTER — Ambulatory Visit: Payer: Medicare (Managed Care) | Admitting: Surgery

## 2022-07-17 ENCOUNTER — Other Ambulatory Visit: Payer: Self-pay

## 2022-07-17 DIAGNOSIS — Z794 Long term (current) use of insulin: Secondary | ICD-10-CM

## 2022-07-17 NOTE — Telephone Encounter (Signed)
Incoming fax from pharmacy Message to prescriber:Drug request change  Drug not covered by patient plan the preferred alternative is vgo,vgo,vgo. Please call/fax the pharmacy to change medication along with strength,directions, quantity and refills.

## 2022-07-18 MED ORDER — CEQUR SIMPLICITY INSERTER MISC: 1 refills | Status: DC

## 2022-07-20 ENCOUNTER — Ambulatory Visit (HOSPITAL_COMMUNITY)
Admission: RE | Admit: 2022-07-20 | Discharge: 2022-07-20 | Disposition: A | Discharge status: Home / Self Care | Payer: Medicare (Managed Care) | Source: Ambulatory Visit | Attending: Surgery | Admitting: Surgery

## 2022-07-20 ENCOUNTER — Ambulatory Visit (INDEPENDENT_AMBULATORY_CARE_PROVIDER_SITE_OTHER)
Admission: RE | Admit: 2022-07-20 | Discharge: 2022-07-20 | Disposition: A | Discharge status: Home / Self Care | Payer: Medicare (Managed Care) | Source: Ambulatory Visit | Attending: Surgery | Admitting: Surgery

## 2022-07-20 DIAGNOSIS — I70213 Atherosclerosis of native arteries of extremities with intermittent claudication, bilateral legs: Secondary | ICD-10-CM

## 2022-07-20 DIAGNOSIS — I6523 Occlusion and stenosis of bilateral carotid arteries: Secondary | ICD-10-CM

## 2022-07-20 DIAGNOSIS — I739 Peripheral vascular disease, unspecified: Secondary | ICD-10-CM

## 2022-07-21 LAB — VAS US ABI WITH/WO TBI: Left ABI: 0.68

## 2022-07-24 ENCOUNTER — Encounter: Payer: Self-pay | Admitting: Surgery

## 2022-07-24 ENCOUNTER — Ambulatory Visit (INDEPENDENT_AMBULATORY_CARE_PROVIDER_SITE_OTHER): Payer: Medicare (Managed Care) | Admitting: Surgery

## 2022-07-24 VITALS — BP 123/74 | HR 73 | Temp 97.9°F | Resp 20 | Ht 73.0 in | Wt 182.0 lb

## 2022-07-24 DIAGNOSIS — I70212 Atherosclerosis of native arteries of extremities with intermittent claudication, left leg: Secondary | ICD-10-CM | POA: Diagnosis not present

## 2022-07-24 DIAGNOSIS — I6523 Occlusion and stenosis of bilateral carotid arteries: Secondary | ICD-10-CM | POA: Diagnosis not present

## 2022-07-24 NOTE — Progress Notes (Signed)
[facility]  Patient name: Nathan [name] MRN: [id number] DOB: 1950/09/18 Sex: male   REASON FOR VISIT:    Follow up  HISOTRY OF PRESENT ILLNESS:    Nathan [name] is a 72 y.o. male who is back today for follow-up.  He initially presented with severe right leg pain in 2008.  He underwent a right femoral to below-knee popliteal artery bypass graft with saphenous vein on 02/26/2007.  This occluded and on 11/12/2007 he had a redo bypass with Gore-Tex.  He ultimately ended up with a right above-knee amputation.  In 2010 he began having left leg claudication and underwent popliteal artery stenting.  This was repeated on 08/17/2017   His symptoms remain stable.  He denies any neurologic issues such as numbness or weakness in either extremity, slurred speech, or amaurosis fugax.  He continues to work on diabetes management.  He continues to take a statin.   PAST MEDICAL HISTORY:   Past Medical History:  Diagnosis Date   Asthma    per 2003 [facility] pulm records pfts    Blood dyscrasia    HIV   Clotting disorder (HCC)    Colon polyps    noted previous colonoscopy [facility]   DDD (degenerative disc disease)    cervical spine   Depression    Diabetes mellitus    dx 2010   Diarrhea 02/24/2021   Dizziness 08/25/2021   Falls 08/25/2021   Fever    unknwon origin   GERD (gastroesophageal reflux disease)    Head swelling 05/23/2017   Hep C w/o coma, chronic (HCC)    History of syphilis    noted [facility] records   HIV infection ([place])    undetectable viral load and CD4 ct 667 as of 11/2011   Hyperlipidemia    Hypertension    MRSA (methicillin resistant Staphylococcus aureus)    Multiple sclerosis ([place])    in remission as of 11/2011 (diagnosed late 1980s)   Pain in limb-Right Leg 10/13/2013   Pancreas (digestive gland) works poorly    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[place])    2002   Pneumonia    Prosthesis fitting  03/10/2019   PVD (peripheral vascular disease) ([place])    Left Stent 07/02/2008   Scalp lesion 07/26/2017   UTI (urinary tract infection)    Wears dentures      FAMILY HISTORY:   Family History  Problem Relation Age of Onset   Diabetes Mother    Coronary artery disease Mother    Coronary artery disease Father    Liver disease Sister    Cancer Brother        colon caner stage 4 as of 11/2011 (unknown age of onset)   Prostate cancer Brother    Colon cancer Brother    Rectal cancer Neg Hx    Esophageal cancer Neg Hx     SOCIAL HISTORY:   Social History   Tobacco Use   Smoking status: Former   Smokeless tobacco: Former    Types: Loss adjuster, chartered  Substance Use Topics   Alcohol use: No    Alcohol/week: 0.0 standard drinks of alcohol     ALLERGIES:   Allergies  Allergen Reactions   Sulfonamide Derivatives Hives     CURRENT MEDICATIONS:   Current Outpatient Medications  Medication Sig Dispense Refill   atorvastatin (LIPITOR) 40 MG tablet TAKE 1 TABLET(40 MG) BY MOUTH DAILY 90 tablet 2   bictegravir-emtricitabine-tenofovir AF (BIKTARVY) 50-200-25 MG TABS tablet Take 1  tablet by mouth daily. 30 tablet 11   Blood Glucose Monitoring Suppl (FREESTYLE LITE) w/Device KIT 1 Device by Does not apply route daily in the afternoon. 1 kit 0   cilostazol (PLETAL) 50 MG tablet TAKE 1 TABLET(50 MG) BY MOUTH TWICE DAILY 60 tablet 3   clopidogrel (PLAVIX) 75 MG tablet Take 1 tablet (75 mg total) by mouth daily. 90 tablet 3   Continuous Blood Gluc Receiver (FREESTYLE LIBRE 3 READER) DEVI 1 each by Does not apply route as directed. Use  with freestyle libre 3 sensors to monitor blood sugar continuously 1 each 0   Continuous Blood Gluc Sensor (FREESTYLE LIBRE 3 SENSOR) MISC Place 1 sensor on the skin every 14 days. Use to check glucose continuously 6 each 3   diclofenac Sodium (VOLTAREN) 1 % GEL Apply 4 g topically 4 (four) times daily as needed. 100 g 1   empagliflozin (JARDIANCE) 25 MG TABS tablet  Take 1 tablet (25 mg total) by mouth daily before breakfast. 90 tablet 3   ezetimibe (ZETIA) 10 MG tablet Take 1 tablet (10 mg total) by mouth daily. 90 tablet 2   glucose blood (FREESTYLE LITE) test strip Use 5 times daily to check blood sugar. 450 each 3   glucose blood (FREESTYLE LITE) test strip 1 each by Other route 3 (three) times daily. Use as instructed 300 each 3   ibuprofen (ADVIL) 400 MG tablet Take by mouth.     injection device for insulin (CEQUR SIMPLICITY 2U) DEVI Apply as directed once every 2 days 45 each 3   Injection Device for Insulin (CEQUR SIMPLICITY INSERTER) MISC Use to Apply a new cecur simplicity patch as directed every 2 days 1 each 1   insulin glargine, 2 Unit Dial, (TOUJEO MAX SOLOSTAR) 300 UNIT/ML Solostar Pen Inject 60 Units into the skin in the morning. 15 mL 3   insulin lispro (HUMALOG KWIKPEN) 200 UNIT/ML KwikPen Max daily 100 units 30 mL 3   insulin lispro (HUMALOG) 100 UNIT/ML injection To use with Cecur Simplicity insulin patch to take 20 units plus correction before meals 3 times daily with a maximum 100 units/day 90 mL 3   Insulin Pen Needle (BD PEN NEEDLE NANO U/F) 32G X 4 MM MISC 1 Device by Other route in the morning, at noon, in the evening, and at bedtime. USE AS DIRECTED FOUR TIMES DAILY. Dx code  E11.42 400 each 3   Lancet Devices (BAYER MICROLET 2 LANCING DEVIC) MISC Use to check blood sugar up to 3 ties a day 1 each 2   Lancets (FREESTYLE) lancets Use 5 times daily to check blood sugar. 450 each 3   lipase/protease/amylase (CREON) 36000 UNITS CPEP capsule Take 4 capsules (144,000 Units total) by mouth 3 (three) times daily before meals. Take four capsule with meals and take two capsules with snacks 480 capsule 11   lisinopril (ZESTRIL) 40 MG tablet TAKE 1 TABLET BY MOUTH DAILY. HOLD IF SYSTOLIC BLOOD PRESSURE IS LESS THAN 110 90 tablet 3   loperamide (IMODIUM) 2 MG capsule Take 1 capsule (2 mg total) by mouth as needed for diarrhea or loose stools. 120  capsule 0   omeprazole (PRILOSEC) 40 MG capsule TAKE 1 CAPSULE(40 MG) BY MOUTH DAILY 90 capsule 1   OZEMPIC, 1 MG/DOSE, 4 MG/3ML SOPN INJECT 1 MG UNDER THE SKIN ONCE A WEEK 9 mL 2   pioglitazone (ACTOS) 30 MG tablet Take 30 mg by mouth daily.     No current facility-administered medications for  this visit.    REVIEW OF SYSTEMS:   [X]  denotes positive finding, [ ]  denotes negative finding Cardiac  Comments:  Chest pain or chest pressure:    Shortness of breath upon exertion:    Short of breath when lying flat:    Irregular heart rhythm:        Vascular    Pain in calf, thigh, or hip brought on by ambulation:    Pain in feet at night that wakes you up from your sleep:     Blood clot in your veins:    Leg swelling:         Pulmonary    Oxygen at home:    Productive cough:     Wheezing:         Neurologic    Sudden weakness in arms or legs:     Sudden numbness in arms or legs:     Sudden onset of difficulty speaking or slurred speech:    Temporary loss of vision in one eye:     Problems with dizziness:         Gastrointestinal    Blood in stool:     Vomited blood:         Genitourinary    Burning when urinating:     Blood in urine:        Psychiatric    Major depression:         Hematologic    Bleeding problems:    Problems with blood clotting too easily:        Skin    Rashes or ulcers:        Constitutional    Fever or chills:      PHYSICAL EXAM:   There were no vitals filed for this visit.  GENERAL: The patient is a well-nourished male, in no acute distress. The vital signs are documented above. CARDIAC: There is a regular rate and rhythm.  VASCULAR: Nonpalpable pedal pulses PULMONARY: Non-labored respirations ABDOMEN: Soft and non-tender with normal pitched bowel sounds.  MUSCULOSKELETAL: Right leg prosthesis NEUROLOGIC: No focal weakness or paresthesias are detected. SKIN: There are no ulcers or rashes noted. PSYCHIATRIC: The patient has a normal  affect.  STUDIES:   I have reviewed the following:  Carotid: Right Carotid: Velocities in the right ICA are consistent with a 60-79%                 stenosis. Non-hemodynamically significant plaque <50% noted  in                the CCA. The ECA appears >50% stenosed.   Left Carotid: Velocities in the left ICA are consistent with a 60-79%  stenosis.               Hemodynamically significant plaque >50% visualized in the  CCA. The                ECA appears >50% stenosed.   Vertebrals:  Bilateral vertebral arteries demonstrate antegrade flow.  Right              vertebral artery demonstrates high resistant flow.  Subclavians: Normal flow hemodynamics were seen in the left subclavian  artery.              Right subclavian artery Abnormal bidirectional waveform.      ABI/TBIToday's ABIToday's TBIPrevious ABIPrevious TBI  +-------+-----------+-----------+------------+------------+  Right AKA        AKA  AKA         AKA           +-------+-----------+-----------+------------+------------+  Left  0.68       0.39       0.89        0.55          +-------+-----------+-----------+------------+------------+   Left: 50-74% stenosis noted in the superficial femoral artery. Patent  stent with no evidence of stenosis in the popliteal artery artery   MEDICAL ISSUES:   AD: I am following a stenosis in his left superficial femoral artery.  Velocities have been 255 for the past 2 ultrasounds.  He remains asymptomatic.  I will continue to follow this.  He will get a new ultrasound in 6 months.  I did have to place a stent in the operating room via an antegrade approach  Carotid: He remains asymptomatic but has bilateral 60-79% stenosis.  He will follow-up in 6 months with repeat ultrasound.   Leia Alf, MD, FACS [facility] [phone] Pager [phone]

## 2022-07-25 ENCOUNTER — Other Ambulatory Visit: Payer: Self-pay

## 2022-07-25 DIAGNOSIS — I70213 Atherosclerosis of native arteries of extremities with intermittent claudication, bilateral legs: Secondary | ICD-10-CM

## 2022-07-25 DIAGNOSIS — I6523 Occlusion and stenosis of bilateral carotid arteries: Secondary | ICD-10-CM

## 2022-07-25 DIAGNOSIS — I739 Peripheral vascular disease, unspecified: Secondary | ICD-10-CM

## 2022-07-27 NOTE — Telephone Encounter (Signed)
Tried calling this patient. Their voicemail box is not set up. I was unable to leave a message  

## 2022-08-01 ENCOUNTER — Encounter: Payer: Self-pay | Admitting: Gastroenterology

## 2022-08-03 ENCOUNTER — Ambulatory Visit: Payer: Medicare (Managed Care) | Admitting: Dietician

## 2022-08-03 ENCOUNTER — Encounter: Payer: Self-pay | Admitting: Dietician

## 2022-08-03 ENCOUNTER — Ambulatory Visit (INDEPENDENT_AMBULATORY_CARE_PROVIDER_SITE_OTHER): Payer: Medicare (Managed Care) | Admitting: Student

## 2022-08-03 VITALS — Wt 184.7 lb

## 2022-08-03 DIAGNOSIS — Z7985 Long-term (current) use of injectable non-insulin antidiabetic drugs: Secondary | ICD-10-CM

## 2022-08-03 DIAGNOSIS — E1151 Type 2 diabetes mellitus with diabetic peripheral angiopathy without gangrene: Secondary | ICD-10-CM | POA: Diagnosis not present

## 2022-08-03 DIAGNOSIS — Z794 Long term (current) use of insulin: Secondary | ICD-10-CM

## 2022-08-03 DIAGNOSIS — E1142 Type 2 diabetes mellitus with diabetic polyneuropathy: Secondary | ICD-10-CM

## 2022-08-03 LAB — GLUCOSE, CAPILLARY: Glucose-Capillary: 332 mg/dL — ABNORMAL HIGH (ref 70–99)

## 2022-08-03 LAB — POCT GLYCOSYLATED HEMOGLOBIN (HGB A1C): Hemoglobin A1C: 9.8 % — AB (ref 4.0–5.6)

## 2022-08-03 MED ORDER — PIOGLITAZONE HCL 30 MG PO TABS: 30.0000 mg | ORAL_TABLET | Freq: Every day | ORAL | 1 refills | Status: DC

## 2022-08-03 MED ORDER — OZEMPIC (2 MG/DOSE) 8 MG/3ML ~~LOC~~ SOPN: 2.0000 mg | PEN_INJECTOR | SUBCUTANEOUS | 1 refills | Status: DC

## 2022-08-03 NOTE — Patient Instructions (Addendum)
Hi Mr Wigton,  Let me know when you get your reader from [facility] and we can schedule a follow up.   Please check your foot daily. Put lotion on heel and up to midfoot daily after bathing to help with dryness.   Drink at least 12-15  8 ounce glasses of water daily.   Always good to see you!   [name] [phone]

## 2022-08-03 NOTE — Patient Instructions (Addendum)
We will increase your ozempic to 2 mg and restart piaglitazone (actos)  Please call your endocrinologist to schedule an appointment. It may be more beneficial for you to have an insulin pump

## 2022-08-03 NOTE — Progress Notes (Addendum)
CC: follow up diabetes  HPI:  Nathan [name] is a 72 y.o. male living with a history stated below and presents today for follow up for his diabetes. Please see problem based assessment and plan for additional details.  Past Medical History:  Diagnosis Date   Asthma    per 2003 [facility] pulm records pfts    Blood dyscrasia    HIV   Clotting disorder ([place])    Colon polyps    noted previous colonoscopy [facility]   DDD (degenerative disc disease)    cervical spine   Depression    Diabetes mellitus    dx 2010   Diarrhea 02/24/2021   Dizziness 08/25/2021   Falls 08/25/2021   Fever    unknwon origin   GERD (gastroesophageal reflux disease)    Head swelling 05/23/2017   Hep C w/o coma, chronic (HCC)    History of syphilis    noted [facility] records   HIV infection ([place])    undetectable viral load and CD4 ct 667 as of 11/2011   Hyperlipidemia    Hypertension    MRSA (methicillin resistant Staphylococcus aureus)    Multiple sclerosis ([place])    in remission as of 11/2011 (diagnosed late 1980s)   Pain in limb-Right Leg 10/13/2013   Pancreas (digestive gland) works poorly    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 30 tablet 11   Blood Glucose Monitoring Suppl (FREESTYLE LITE) w/Device KIT 1 Device by Does not apply route daily in the afternoon. 1 kit 0   cilostazol (PLETAL) 50 MG tablet TAKE 1 TABLET(50 MG) BY MOUTH TWICE DAILY 60 tablet 3   clopidogrel (PLAVIX) 75 MG tablet Take 1 tablet (75 mg total) by  mouth daily. 90 tablet 3   Continuous Blood Gluc Receiver (FREESTYLE LIBRE 3 READER) DEVI 1 each by Does not apply route as directed. Use  with freestyle libre 3 sensors to monitor blood sugar continuously 1 each 0   Continuous Blood Gluc Sensor (FREESTYLE LIBRE 3 SENSOR) MISC Place 1 sensor on the skin every 14 days. Use to check glucose continuously 6 each 3   diclofenac Sodium (VOLTAREN) 1 % GEL Apply 4 g topically 4 (four) times daily as needed. 100 g 1   empagliflozin (JARDIANCE) 25 MG TABS tablet Take 1 tablet (25 mg total) by mouth daily before breakfast. 90 tablet 3   ezetimibe (ZETIA) 10 MG tablet Take 1 tablet (10 mg total) by mouth daily. 90 tablet 2   glucose blood (FREESTYLE LITE) test strip Use 5 times daily to check blood sugar. 450 each 3   glucose blood (FREESTYLE LITE) test strip 1 each by Other route 3 (three) times daily. Use as instructed 300 each 3   ibuprofen (ADVIL) 400 MG tablet Take by mouth.     injection device for insulin (CEQUR SIMPLICITY 2U) DEVI Apply as directed once every 2 days 45 each 3   Injection Device for Insulin (CEQUR SIMPLICITY INSERTER) MISC Use  to Apply a new cecur simplicity patch as directed every 2 days 1 each 1   insulin glargine, 2 Unit Dial, (TOUJEO MAX SOLOSTAR) 300 UNIT/ML Solostar Pen Inject 60 Units into the skin in the morning. 15 mL 3   insulin lispro (HUMALOG KWIKPEN) 200 UNIT/ML KwikPen Max daily 100 units 30 mL 3   insulin lispro (HUMALOG) 100 UNIT/ML injection To use with Cecur Simplicity insulin patch to take 20 units plus correction before meals 3 times daily with a maximum 100 units/day 90 mL 3   Insulin Pen Needle (BD PEN NEEDLE NANO U/F) 32G X 4 MM MISC 1 Device by Other route in the morning, at noon, in the evening, and at bedtime. USE AS DIRECTED FOUR TIMES DAILY. Dx code  E11.42 400 each 3   Lancet Devices (BAYER MICROLET 2 LANCING DEVIC) MISC Use to check blood sugar up to 3 ties a day 1 each 2   Lancets (FREESTYLE) lancets Use 5  times daily to check blood sugar. 450 each 3   lipase/protease/amylase (CREON) 36000 UNITS CPEP capsule Take 4 capsules (144,000 Units total) by mouth 3 (three) times daily before meals. Take four capsule with meals and take two capsules with snacks 480 capsule 11   lisinopril (ZESTRIL) 40 MG tablet TAKE 1 TABLET BY MOUTH DAILY. HOLD IF SYSTOLIC BLOOD PRESSURE IS LESS THAN 110 90 tablet 3   loperamide (IMODIUM) 2 MG capsule Take 1 capsule (2 mg total) by mouth as needed for diarrhea or loose stools. 120 capsule 0   omeprazole (PRILOSEC) 40 MG capsule TAKE 1 CAPSULE(40 MG) BY MOUTH DAILY 90 capsule 1   No current facility-administered medications on file prior to visit.    Review of Systems: ROS negative except for what is noted on the assessment and plan.  Vitals:   08/03/22 1430  BP: 139/66  Pulse: 84  Temp: 97.6 F (36.4 C)  TempSrc: Oral  SpO2: 91%  Weight: 184 lb 9.6 oz (83.7 kg)  Height: 6\' 1"  (1.854 m)    Physical Exam: Constitutional: well-appearing, in no acute distress HENT: normocephalic atraumatic Eyes: conjunctiva non-erythematous Pulmonary/Chest: normal work of breathing on room air Neurological: alert & oriented x 3 Skin: warm and dry Psych: normal mood  Assessment & Plan:   Type 2 DM with diabetic peripheral angiopathy w/o gangrene (HCC) A1c of 9.8%. Current regimen of ozempic 1mg  weekly, humalog 25-30 U with meals, glargine 60 U daily. Since increasing his creon dose, he has noticed persistently elevated glucose levels in the 200-300's on his CGM. This was confirmed in his print out today.   Will restart pioglitazone and increase ozempic to 2 mg. If persistently elevated glucose levels may do well with insulin pump. Instructed him to call his endocrinologist to follow up sooner. Foot exam today without lesions or wounds  Patient discussed with Dr. Fanny Bien, D.O. [facility], PGY-3 Phone: [phone] Date 08/03/2022 Time  10:56 PM

## 2022-08-03 NOTE — Assessment & Plan Note (Addendum)
A1c of 9.8%. Current regimen of ozempic 1mg  weekly, humalog 25-30 U with meals, glargine 60 U daily. Since increasing his creon dose, he has noticed persistently elevated glucose levels in the 200-300's on his CGM. This was confirmed in his print out today.   Will restart pioglitazone and increase ozempic to 2 mg. If persistently elevated glucose levels may do well with insulin pump. Instructed him to call his endocrinologist to follow up sooner. Foot exam today without lesions or wounds

## 2022-08-03 NOTE — Progress Notes (Signed)
Diabetes Self-Management Education  Visit Type:  Follow-up  Appt. Start Time: 1315 Appt. End Time: [id number]  08/03/2022  [name], identified by name and date of birth, is a 72 y.o. male with a diagnosis of Diabetes:  .   ASSESSMENT  Weight 184 lb 11.2 oz (83.8 kg). Body mass index is 24.37 kg/m. Dexcom G7 reading HI Reader lasting 10 days instead of 14, cannot get medicines Gave him 6 vials yesterday of Humalog but was out for  Does not  think he is taking pioglitazine, taking ozempic 1 mg,  and Jardiance  Eating a lot, cannot get enough to eat always starving wakes in the middle of the night eyes dry, mouth is dry..   GI doctor doubled his creon and it is helping much better stools, gaining weight, blood sugar higher.      Diabetes Self-Management Education - 08/03/22 1600       Health Coping   How would you rate your overall health? Fair      Psychosocial Assessment   Patient Belief/Attitude about Diabetes Motivated to manage diabetes    What is the hardest part about your diabetes right now, causing you the most concern, or is the most worrisome to you about your diabetes?   Taking/obtaining medications    Self-care barriers Lack of material resources    Self-management support CDE visits;[place] office    Patient Concerns Glycemic Control;Support    Special Needs None    Preferred Learning Style No preference indicated    Learning Readiness Ready      Pre-Education Assessment   Patient understands the diabetes disease and treatment process. Comprehends key points    Patient understands incorporating nutritional management into lifestyle. Comprehends key points    Patient undertands incorporating physical activity into lifestyle. Demonstrates understanding / competency    Patient understands using medications safely. Comprehends key points    Patient understands monitoring blood glucose, interpreting and using results Comprehends key points    Patient  understands prevention, detection, and treatment of acute complications. Comprehends key points    Patient understands prevention, detection, and treatment of chronic complications. Needs Review    Patient understands how to develop strategies to address psychosocial issues. Comprehends key points    Patient understands how to develop strategies to promote health/change behavior. Comprehends key points      Complications   Last HgB A1C per patient/outside source 9.8 %    How often do you check your blood sugar? > 4 times/day    Fasting Blood glucose range (mg/dL) >200    Postprandial Blood glucose range (mg/dL) >200    Number of hypoglycemic episodes per month 0    Number of hyperglycemic episodes ( >200mg /dL): Daily    Can you tell when your blood sugar is high? Yes    What do you do if your blood sugar is high? takes extra Humalog    Have you had a dilated eye exam in the past 12 months? Yes    Have you had a dental exam in the past 12 months? --   need to assess   Are you checking your feet? No   we discussed this today     Dietary Intake   Breakfast plan to address at future visit      Activity / Exercise   Activity / Exercise Type ADL's;Light (walking / raking leaves)   amputation limits his activity     Patient Education   Chronic complications Assessed and discussed  foot care and prevention of foot problems      Individualized Goals (developed by patient)   Reducing Risk do foot checks daily      Post-Education Assessment   Patient understands prevention, detection, and treatment of chronic complications. Comprehends key points      Outcomes   Program Status Completed      Subsequent Visit   Since your last visit have you continued or begun to take your medications as prescribed? Yes   takes extra Humalog, has not been getting a regular supply form pharmacy but say he had back up so has been taking it without fail   Since your last visit have you experienced any weight  changes? Gain    Weight Gain (lbs) 3    Since your last visit, are you checking your blood glucose at least once a day? Yes   problems with Freestyle Libre 3 reader            Learning Objective:  Patient will have a greater understanding of diabetes self-management. Patient education plan is to attend individual and/or group sessions per assessed needs and concerns.   Plan:   There are no Patient Instructions on file for this visit.   Expected Outcomes:  Demonstrated interest in learning. Expect positive outcomes  Education material provided: Diabetes Resources  If problems or questions, patient to contact team via:  Phone  Future DSME appointment: - 4-6 wks [name], RD 08/03/2022 5:04 PM.

## 2022-08-06 NOTE — Progress Notes (Unsigned)
Chief complaint: Follow-up for HIV disease on medications Subjective:    Patient ID: Nathan [name], male    DOB: 14-Sep-1950, 72 y.o.   MRN: [id number]  HPI  Nathan [name] is a 72 y.o. male whose HIV has been perfectly suppressed on Biktarvy and BID AZT  We ran [employer] which is shown below:      He has done well on [place] alone since then.  He is currently follow with primary care and vascular surgery.      Past Medical History:  Diagnosis Date   Asthma    per 2003 [facility] pulm records pfts    Blood dyscrasia    HIV   Clotting disorder ([place])    Colon polyps    noted previous colonoscopy [facility]   DDD (degenerative disc disease)    cervical spine   Depression    Diabetes mellitus    dx 2010   Diarrhea 02/24/2021   Dizziness 08/25/2021   Falls 08/25/2021   Fever    unknwon origin   GERD (gastroesophageal reflux disease)    Head swelling 05/23/2017   Hep C w/o coma, chronic (HCC)    History of syphilis    noted Nathan [name] records   HIV infection ([place])    undetectable viral load and CD4 ct 667 as of 11/2011   Hyperlipidemia    Hypertension    MRSA (methicillin resistant Staphylococcus aureus)    Multiple sclerosis ([place])    in remission as of 11/2011 (diagnosed late 1980s)   Pain in limb-Right Leg 10/13/2013   Pancreas (digestive gland) works poorly    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   Pneumonia    Prosthesis fitting 03/10/2019   PVD (peripheral vascular disease) ([place])    Left Stent 07/02/2008   Scalp lesion 07/26/2017   UTI (urinary tract infection)    Wears dentures     Past Surgical History:  Procedure Laterality Date   ABDOMINAL AORTOGRAM W/LOWER EXTREMITY N/A 08/07/2017   Procedure: ABDOMINAL AORTOGRAM W/LOWER EXTREMITY;  Surgeon: Nathan Mitchell, MD;  Location: [facility];  Service: Cardiovascular;  Laterality: N/A;   ABOVE KNEE LEG AMPUTATION     Right Leg   COLONOSCOPY W/ BIOPSIES AND POLYPECTOMY     LOWER  EXTREMITY ANGIOGRAM Left 12/26/2011   Procedure: LOWER EXTREMITY ANGIOGRAM;  Surgeon: Nathan Mitchell, MD;  Location: [facility] CATH LAB;  Service: Cardiovascular;  Laterality: Left;   LOWER EXTREMITY ANGIOGRAM Left 08/17/2017   Procedure: LOWER EXTREMITY ANGIOGRAM LEFT LEG WITH RUNOFF AND Stenting.;  Surgeon: Nathan Mitchell, MD;  Location: [facility] OR;  Service: Vascular;  Laterality: Left;   MULTIPLE TOOTH EXTRACTIONS     OTHER SURGICAL HISTORY     right lower ext AKA with prothesis    OTHER SURGICAL HISTORY     left left with stents (Dr. Seward [name])   OTHER SURGICAL HISTORY     2003 colonoscopy 5 mm polyp transverse colon; (2)74mm  polyps in rectum-hyperplastic   PERIPHERAL VASCULAR INTERVENTION Left 08/17/2017   Procedure: POPLITEAL STENT;  Surgeon: Nathan Mitchell, MD;  Location: [facility] OR;  Service: Vascular;  Laterality: Left;    Family History  Problem Relation Age of Onset   Diabetes Mother    Coronary artery disease Mother    Coronary artery disease Father    Liver disease Sister    Cancer Brother        colon caner stage 4 as of 11/2011 (unknown age of onset)  Prostate cancer Brother    Colon cancer Brother    Rectal cancer Neg Hx    Esophageal cancer Neg Hx       Social History   Socioeconomic History   Marital status: Single    Spouse name: Not on file   Number of children: 1   Years of education: 12   Highest education level: Not on file  Occupational History   Occupation: retired  Tobacco Use   Smoking status: Former   Smokeless tobacco: Former    Types: Nurse, children's Use: Never used  Substance and Sexual Activity   Alcohol use: No    Alcohol/week: 0.0 standard drinks of alcohol   Drug use: No   Sexual activity: Yes    Partners: Male    Comment: declined condoms  Other Topics Concern   Not on file  Social History Narrative   Not on file   Social Determinants of [name]   Financial Resource Strain: Low Risk  (04/04/2022)   Overall Financial  Resource Strain (CARDIA)    Difficulty of Paying Living Expenses: Not hard at all  Food Insecurity: Nathan [name] Present (04/04/2022)   Hunger Vital Sign    Worried About [place] in the Last Year: Sometimes true    Ran Out of Food in the Last Year: Never true  Transportation Needs: No Transportation Needs (04/04/2022)   PRAPARE - Hydrologist (Medical): No    Lack of Transportation (Non-Medical): No  Physical Activity: Insufficiently Active (04/04/2022)   Exercise Vital Sign    Days of Exercise per Week: 1 day    Minutes of Exercise per Session: 20 min  Stress: No Stress Concern Present (04/04/2022)   New Nathan [name]    Feeling of Stress : Not at all  Social Connections: Socially Isolated (04/04/2022)   Social Connection and Isolation Panel [NHANES]    Frequency of Communication with Friends and Family: Never    Frequency of Social Gatherings with Friends and Family: Once a week    Attends Religious Services: Never    Marine scientist or Organizations: No    Attends Music therapist: Never    Marital Status: Divorced    Allergies  Allergen Reactions   Sulfonamide Derivatives Hives     Current Outpatient Medications:    atorvastatin (LIPITOR) 40 MG tablet, TAKE 1 TABLET(40 MG) BY MOUTH DAILY, Disp: 90 tablet, Rfl: 2   bictegravir-emtricitabine-tenofovir AF (BIKTARVY) 50-200-25 MG TABS tablet, Take 1 tablet by mouth daily., Disp: 30 tablet, Rfl: 11   Blood Glucose Monitoring Suppl (FREESTYLE LITE) w/Device KIT, 1 Device by Does not apply route daily in the afternoon., Disp: 1 kit, Rfl: 0   cilostazol (PLETAL) 50 MG tablet, TAKE 1 TABLET(50 MG) BY MOUTH TWICE DAILY, Disp: 60 tablet, Rfl: 3   clopidogrel (PLAVIX) 75 MG tablet, Take 1 tablet (75 mg total) by mouth daily., Disp: 90 tablet, Rfl: 3   Continuous Blood Gluc Receiver (FREESTYLE LIBRE 3 READER) DEVI, 1  each by Does not apply route as directed. Use  with freestyle libre 3 sensors to monitor blood sugar continuously, Disp: 1 each, Rfl: 0   Continuous Blood Gluc Sensor (FREESTYLE LIBRE 3 SENSOR) MISC, Place 1 sensor on the skin every 14 days. Use to check glucose continuously, Disp: 6 each, Rfl: 3   diclofenac Sodium (VOLTAREN) 1 % GEL, Apply 4 g topically 4 (  four) times daily as needed., Disp: 100 g, Rfl: 1   empagliflozin (JARDIANCE) 25 MG TABS tablet, Take 1 tablet (25 mg total) by mouth daily before breakfast., Disp: 90 tablet, Rfl: 3   ezetimibe (ZETIA) 10 MG tablet, Take 1 tablet (10 mg total) by mouth daily., Disp: 90 tablet, Rfl: 2   glucose blood (FREESTYLE LITE) test strip, Use 5 times daily to check blood sugar., Disp: 450 each, Rfl: 3   glucose blood (FREESTYLE LITE) test strip, 1 each by Other route 3 (three) times daily. Use as instructed, Disp: 300 each, Rfl: 3   ibuprofen (ADVIL) 400 MG tablet, Take by mouth., Disp: , Rfl:    injection device for insulin (CEQUR SIMPLICITY 2U) DEVI, Apply as directed once every 2 days, Disp: 45 each, Rfl: 3   Injection Device for Insulin (CEQUR SIMPLICITY INSERTER) MISC, Use to Apply a new cecur simplicity patch as directed every 2 days, Disp: 1 each, Rfl: 1   insulin glargine, 2 Unit Dial, (TOUJEO MAX SOLOSTAR) 300 UNIT/ML Solostar Pen, Inject 60 Units into the skin in the morning., Disp: 15 mL, Rfl: 3   insulin lispro (HUMALOG KWIKPEN) 200 UNIT/ML KwikPen, Max daily 100 units, Disp: 30 mL, Rfl: 3   insulin lispro (HUMALOG) 100 UNIT/ML injection, To use with Cecur Simplicity insulin patch to take 20 units plus correction before meals 3 times daily with a maximum 100 units/day, Disp: 90 mL, Rfl: 3   Insulin Pen Needle (BD PEN NEEDLE NANO U/F) 32G X 4 MM MISC, 1 Device by Other route in the morning, at noon, in the evening, and at bedtime. USE AS DIRECTED FOUR TIMES DAILY. Dx code  E11.42, Disp: 400 each, Rfl: 3   Lancet Devices (BAYER MICROLET 2 LANCING  DEVIC) MISC, Use to check blood sugar up to 3 ties a day, Disp: 1 each, Rfl: 2   Lancets (FREESTYLE) lancets, Use 5 times daily to check blood sugar., Disp: 450 each, Rfl: 3   lipase/protease/amylase (CREON) 36000 UNITS CPEP capsule, Take 4 capsules (144,000 Units total) by mouth 3 (three) times daily before meals. Take four capsule with meals and take two capsules with snacks, Disp: 480 capsule, Rfl: 11   lisinopril (ZESTRIL) 40 MG tablet, TAKE 1 TABLET BY MOUTH DAILY. HOLD IF SYSTOLIC BLOOD PRESSURE IS LESS THAN 110, Disp: 90 tablet, Rfl: 3   loperamide (IMODIUM) 2 MG capsule, Take 1 capsule (2 mg total) by mouth as needed for diarrhea or loose stools., Disp: 120 capsule, Rfl: 0   omeprazole (PRILOSEC) 40 MG capsule, TAKE 1 CAPSULE(40 MG) BY MOUTH DAILY, Disp: 90 capsule, Rfl: 1   pioglitazone (ACTOS) 30 MG tablet, Take 1 tablet (30 mg total) by mouth daily., Disp: 90 tablet, Rfl: 1   Semaglutide, 2 MG/DOSE, (OZEMPIC, 2 MG/DOSE,) 8 MG/3ML SOPN, Inject 2 mg into the skin once a week., Disp: 3 mL, Rfl: 1  Review of Systems     Objective:   Physical Exam      HIV disease:  I will add order HIV viral load CD4 count CBC with differential CMP, RPR GC and chlamydia and I will continue  Kwamaine D Kretz's Biktarvy prescription   Hypertensoin  There were no vitals filed for this visit.   Hyperlipidemia: continue atorvastatin  PVD: he is continuing cilostozal, plavix  DM he is on insulin, ozempic

## 2022-08-07 ENCOUNTER — Other Ambulatory Visit (HOSPITAL_COMMUNITY)
Admission: RE | Admit: 2022-08-07 | Discharge: 2022-08-07 | Disposition: A | Discharge status: Home / Self Care | Payer: Medicare (Managed Care) | Source: Ambulatory Visit | Attending: Infectious Disease | Admitting: Infectious Disease

## 2022-08-07 ENCOUNTER — Ambulatory Visit: Payer: Medicare (Managed Care) | Admitting: Infectious Disease

## 2022-08-07 ENCOUNTER — Encounter: Payer: Self-pay | Admitting: Infectious Disease

## 2022-08-07 ENCOUNTER — Ambulatory Visit (INDEPENDENT_AMBULATORY_CARE_PROVIDER_SITE_OTHER): Payer: Medicare (Managed Care)

## 2022-08-07 ENCOUNTER — Other Ambulatory Visit: Payer: Self-pay

## 2022-08-07 VITALS — BP 124/70 | HR 69 | Resp 16 | Ht 73.0 in | Wt 181.0 lb

## 2022-08-07 DIAGNOSIS — Z794 Long term (current) use of insulin: Secondary | ICD-10-CM | POA: Diagnosis not present

## 2022-08-07 DIAGNOSIS — Z113 Encounter for screening for infections with a predominantly sexual mode of transmission: Secondary | ICD-10-CM | POA: Insufficient documentation

## 2022-08-07 DIAGNOSIS — D126 Benign neoplasm of colon, unspecified: Secondary | ICD-10-CM | POA: Diagnosis not present

## 2022-08-07 DIAGNOSIS — E1151 Type 2 diabetes mellitus with diabetic peripheral angiopathy without gangrene: Secondary | ICD-10-CM

## 2022-08-07 DIAGNOSIS — Z79899 Other long term (current) drug therapy: Secondary | ICD-10-CM | POA: Diagnosis not present

## 2022-08-07 DIAGNOSIS — B20 Human immunodeficiency virus [HIV] disease: Secondary | ICD-10-CM

## 2022-08-07 DIAGNOSIS — Z23 Encounter for immunization: Secondary | ICD-10-CM

## 2022-08-07 DIAGNOSIS — Z7185 Encounter for immunization safety counseling: Secondary | ICD-10-CM

## 2022-08-07 DIAGNOSIS — I10 Essential (primary) hypertension: Secondary | ICD-10-CM

## 2022-08-07 MED ORDER — DOXYCYCLINE HYCLATE 100 MG PO TABS: ORAL_TABLET | ORAL | 3 refills | Status: DC

## 2022-08-07 MED ORDER — BIKTARVY 50-200-25 MG PO TABS: 1.0000 | ORAL_TABLET | Freq: Every day | ORAL | 11 refills | Status: DC

## 2022-08-08 ENCOUNTER — Other Ambulatory Visit: Payer: Self-pay | Admitting: *Deleted

## 2022-08-08 DIAGNOSIS — Z794 Long term (current) use of insulin: Secondary | ICD-10-CM

## 2022-08-08 LAB — CYTOLOGY, (ORAL, ANAL, URETHRAL) ANCILLARY ONLY
Chlamydia: NEGATIVE
Comment: NEGATIVE
Comment: NORMAL
Comment: NEGATIVE
Comment: NORMAL
Neisseria Gonorrhea: NEGATIVE

## 2022-08-08 LAB — URINE CYTOLOGY ANCILLARY ONLY
Chlamydia: NEGATIVE
Comment: NEGATIVE
Comment: NORMAL
Neisseria Gonorrhea: NEGATIVE

## 2022-08-09 ENCOUNTER — Other Ambulatory Visit: Payer: Self-pay

## 2022-08-09 DIAGNOSIS — I739 Peripheral vascular disease, unspecified: Secondary | ICD-10-CM

## 2022-08-09 LAB — T-HELPER CELLS (CD4) COUNT (NOT AT ARMC)
CD4 % Helper T Cell: 20 % — ABNORMAL LOW (ref 33–65)
CD4 T Cell Abs: 683 /uL (ref 400–1790)

## 2022-08-09 MED ORDER — CILOSTAZOL 50 MG PO TABS: ORAL_TABLET | ORAL | 3 refills | Status: DC

## 2022-08-10 ENCOUNTER — Encounter: Payer: Self-pay | Admitting: Gastroenterology

## 2022-08-10 ENCOUNTER — Ambulatory Visit (AMBULATORY_SURGERY_CENTER): Payer: Medicare (Managed Care) | Admitting: Gastroenterology

## 2022-08-10 VITALS — BP 122/59 | HR 57 | Temp 97.7°F | Resp 14 | Ht 73.0 in | Wt 183.0 lb

## 2022-08-10 DIAGNOSIS — D125 Benign neoplasm of sigmoid colon: Secondary | ICD-10-CM

## 2022-08-10 DIAGNOSIS — F32A Depression, unspecified: Secondary | ICD-10-CM | POA: Diagnosis not present

## 2022-08-10 DIAGNOSIS — Z09 Encounter for follow-up examination after completed treatment for conditions other than malignant neoplasm: Secondary | ICD-10-CM

## 2022-08-10 DIAGNOSIS — Z86010 Personal history of colonic polyps: Secondary | ICD-10-CM

## 2022-08-10 DIAGNOSIS — K635 Polyp of colon: Secondary | ICD-10-CM | POA: Diagnosis not present

## 2022-08-10 DIAGNOSIS — K2980 Duodenitis without bleeding: Secondary | ICD-10-CM | POA: Diagnosis not present

## 2022-08-10 DIAGNOSIS — E119 Type 2 diabetes mellitus without complications: Secondary | ICD-10-CM | POA: Diagnosis not present

## 2022-08-10 DIAGNOSIS — D126 Benign neoplasm of colon, unspecified: Secondary | ICD-10-CM

## 2022-08-10 LAB — CBC WITH DIFFERENTIAL/PLATELET
Absolute Monocytes: 504 cells/uL (ref 200–950)
Basophils Absolute: 58 cells/uL (ref 0–200)
Basophils Relative: 0.8 %
Eosinophils Absolute: 270 cells/uL (ref 15–500)
Eosinophils Relative: 3.7 %
HCT: 48.7 % (ref 38.5–50.0)
Hemoglobin: 16.6 g/dL (ref 13.2–17.1)
Lymphs Abs: 3986 cells/uL — ABNORMAL HIGH (ref 850–3900)
MCH: 30.6 pg (ref 27.0–33.0)
MCHC: 34.1 g/dL (ref 32.0–36.0)
MCV: 89.7 fL (ref 80.0–100.0)
MPV: 9.1 fL (ref 7.5–12.5)
Monocytes Relative: 6.9 %
Neutro Abs: 2482 cells/uL (ref 1500–7800)
Neutrophils Relative %: 34 %
Platelets: 234 10*3/uL (ref 140–400)
RBC: 5.43 10*6/uL (ref 4.20–5.80)
RDW: 12.5 % (ref 11.0–15.0)
Total Lymphocyte: 54.6 %
WBC: 7.3 10*3/uL (ref 3.8–10.8)

## 2022-08-10 LAB — COMPLETE METABOLIC PANEL WITH GFR
AG Ratio: 1.8 (calc) (ref 1.0–2.5)
ALT: 23 U/L (ref 9–46)
AST: 24 U/L (ref 10–35)
Albumin: 4.6 g/dL (ref 3.6–5.1)
Alkaline phosphatase (APISO): 59 U/L (ref 35–144)
BUN: 21 mg/dL (ref 7–25)
CO2: 26 mmol/L (ref 20–32)
Calcium: 9.6 mg/dL (ref 8.6–10.3)
Chloride: 104 mmol/L (ref 98–110)
Creat: 1.25 mg/dL (ref 0.70–1.28)
Globulin: 2.6 g/dL (calc) (ref 1.9–3.7)
Glucose, Bld: 174 mg/dL — ABNORMAL HIGH (ref 65–99)
Potassium: 4.2 mmol/L (ref 3.5–5.3)
Sodium: 140 mmol/L (ref 135–146)
Total Bilirubin: 0.5 mg/dL (ref 0.2–1.2)
Total Protein: 7.2 g/dL (ref 6.1–8.1)
eGFR: 61 mL/min/{1.73_m2} (ref >=60)

## 2022-08-10 LAB — RPR: RPR Ser Ql: NONREACTIVE

## 2022-08-10 LAB — LIPID PANEL
Cholesterol: 114 mg/dL (ref <200)
HDL: 38 mg/dL — ABNORMAL LOW (ref >=40)
LDL Cholesterol (Calc): 55 mg/dL (calc)
Non-HDL Cholesterol (Calc): 76 mg/dL (calc) (ref <130)
Total CHOL/HDL Ratio: 3 (calc) (ref <5.0)
Triglycerides: 126 mg/dL (ref <150)

## 2022-08-10 LAB — HIV-1 RNA QUANT-NO REFLEX-BLD
HIV 1 RNA Quant: NOT DETECTED Copies/mL
HIV-1 RNA Quant, Log: NOT DETECTED Log cps/mL

## 2022-08-10 MED ORDER — SODIUM CHLORIDE 0.9 % IV SOLN
500.0000 mL | INTRAVENOUS | Status: DC
Start: 2022-08-10 — End: 2022-08-10

## 2022-08-10 NOTE — Progress Notes (Signed)
Internal Medicine Clinic Attending  Case discussed with Dr. Katsadouros at the time of the visit.  We reviewed the resident's history and exam and pertinent patient test results.  I agree with the assessment, diagnosis, and plan of care documented in the resident's note.  

## 2022-08-10 NOTE — Progress Notes (Signed)
Vss nad trans to pacu 

## 2022-08-10 NOTE — Op Note (Addendum)
Nathan [name] Patient Name: Maclaren Arroyave Procedure Date: 08/10/2022 1:51 PM MRN: [id number] Endoscopist: Jackquline Denmark , MD, [id number] Age: 72 Referring MD:  Date of Birth: [birth year] Gender: Male Account #: [id number] Procedure:                Colonoscopy Indications:              High risk colon cancer surveillance: Personal                            history of colonic polyps. H/O diarrhea. Medicines:                Monitored Anesthesia Care Procedure:                Pre-Anesthesia Assessment:                           - Prior to the procedure, a History and Physical                            was performed, and patient medications and                            allergies were reviewed. The patient's tolerance of                            previous anesthesia was also reviewed. The risks                            and benefits of the procedure and the sedation                            options and risks were discussed with the patient.                            All questions were answered, and informed consent                            was obtained. Prior Anticoagulants: Plavix was held                            5 days prior. ASA Grade Assessment: II - A patient                            with mild systemic disease. After reviewing the                            risks and benefits, the patient was deemed in                            satisfactory condition to undergo the procedure.                           After obtaining informed consent, the colonoscope  was passed under direct vision. Throughout the                            procedure, the patient's blood pressure, pulse, and                            oxygen saturations were monitored continuously. The                            CF [id number] was introduced through the anus                            and advanced to the 2 cm into the ileum. The                            colonoscopy  was performed without difficulty. The                            patient tolerated the procedure well. The quality                            of the bowel preparation was good. The terminal                            ileum, ileocecal valve, appendiceal orifice, and                            rectum were photographed. Scope In: 1:59:05 PM Scope Out: 2:18:21 PM Scope Withdrawal Time: 0 hours 16 minutes 1 second  Total Procedure Duration: 0 hours 19 minutes 16 seconds  Findings:                 Two sessile polyps were found in the proximal                            sigmoid colon and mid sigmoid colon. The polyps                            were 4 to 6 mm in size. These polyps were removed                            with a cold snare. Resection and retrieval were                            complete.                           The colon (entire examined portion) appeared                            normal. Biopsies were taken with a cold forceps for                            histology. There was mild  edema in the sigmoid                            colon without any erythema or erosions. Multiple                            biopsies were obtained from sigmoid colon and sent                            in a separate jar.                           A few medium-mouthed diverticula were found in the                            sigmoid colon.                           Non-bleeding internal hemorrhoids were found during                            retroflexion. The hemorrhoids were small and Grade                            I (internal hemorrhoids that do not prolapse).                           The terminal ileum appeared normal. Biopsies were                            taken with a cold forceps for histology.                           The exam was otherwise without abnormality on                            direct and retroflexion views. Complications:            No immediate complications. Estimated  Blood Loss:     Estimated blood loss: none. Impression:               - Two 4 to 6 mm polyps in the proximal sigmoid                            colon and in the mid sigmoid colon, removed with a                            cold snare. Resected and retrieved.                           - Minimal sigmoid diverticulosis.                           - Non-bleeding internal hemorrhoids.                           -  The examined portion of the ileum was normal.                            Biopsied.                           - The examination was otherwise normal on direct                            and retroflexion views. Recommendation:           - Patient has a contact number available for                            emergencies. The signs and symptoms of potential                            delayed complications were discussed with the                            patient. Return to normal activities tomorrow.                            Written discharge instructions were provided to the                            patient.                           - Resume previous diet.                           - Continue present medications.                           - Await pathology results.                           - Resume Plavix from 08/12/2022                           - Repeat colonoscopy is not recommended for                            surveillance.                           - Return to GI clinic PRN. Jackquline Denmark, MD 08/10/2022 2:26:05 PM This report has been signed electronically.

## 2022-08-10 NOTE — Progress Notes (Signed)
06/20/2022 Nathan [name] [id number] Jun 13, 1950     HISTORY OF PRESENT ILLNESS: This is a 72 year old male who is a patient Nathan [name].  He has a history of colon polyps.  Last colonoscopy January 2020 at which time he had 4 polyps removed, some were adenomatous.  Nathan [name] recommended a repeat colonoscopy at a 3-year interval.  He is diabetic on insulin and other medications.  He has history of vascular disease and is on Plavix for several years.  This is prescribed by his PCP.  He tells me that he has had issues with chronic diarrhea for years.  Pancreatic fecal elastase was checked by his PCP in November and was less than 50.  They placed him on Creon 72,000 units with meals 36,000 units with snacks.  He says that he has been taking it, but has not really noticed any difference in his diarrhea.  He denies any rectal bleeding.         Past Medical History:  Diagnosis Date   Asthma      per 2003 [facility] pulm records pfts    Blood dyscrasia      HIV   Clotting disorder ([place])     Colon polyps      noted previous colonoscopy [facility]   DDD (degenerative disc disease)      cervical spine   Depression     Diabetes mellitus      dx 2010   Diarrhea 02/24/2021   Dizziness 08/25/2021   Falls 08/25/2021   Fever      [place] origin   GERD (gastroesophageal reflux disease)     Head swelling 05/23/2017   Hep C w/o coma, chronic (HCC)     History of syphilis      noted [facility] records   HIV infection ([place])      undetectable viral load and CD4 ct 667 as of 11/2011   Hyperlipidemia     Hypertension     MRSA (methicillin resistant Staphylococcus aureus)     Multiple sclerosis ([place])      in remission as of 11/2011 (diagnosed late 1980s)   Pain in limb-Right Leg 10/13/2013   Pancreas (digestive gland) works poorly     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   Pneumonia     Prosthesis fitting 03/10/2019   PVD (peripheral vascular disease) ([place])      Left Stent 07/02/2008    Scalp lesion 07/26/2017   UTI (urinary tract infection)     Wears dentures           Past Surgical History:  Procedure Laterality Date   ABDOMINAL AORTOGRAM W/LOWER EXTREMITY N/A 08/07/2017    Procedure: ABDOMINAL AORTOGRAM W/LOWER EXTREMITY;  Surgeon: Serafina Mitchell, MD;  Location: [facility];  Service: Cardiovascular;  Laterality: N/A;   ABOVE KNEE LEG AMPUTATION        Right Leg   COLONOSCOPY W/ BIOPSIES AND POLYPECTOMY       LOWER EXTREMITY ANGIOGRAM Left 12/26/2011    Procedure: LOWER EXTREMITY ANGIOGRAM;  Surgeon: Serafina Mitchell, MD;  Location: [facility] CATH LAB;  Service: Cardiovascular;  Laterality: Left;   LOWER EXTREMITY ANGIOGRAM Left 08/17/2017    Procedure: LOWER EXTREMITY ANGIOGRAM LEFT LEG WITH RUNOFF AND Stenting.;  Surgeon: Serafina Mitchell, MD;  Location: [facility] OR;  Service: Vascular;  Laterality: Left;   MULTIPLE TOOTH EXTRACTIONS       OTHER SURGICAL HISTORY  right lower ext AKA with prothesis    OTHER SURGICAL HISTORY        left left with stents (Dr. Seward Speck)   OTHER SURGICAL HISTORY        2003 colonoscopy 5 mm polyp transverse colon; (2)34mm  polyps in rectum-hyperplastic   PERIPHERAL VASCULAR INTERVENTION Left 08/17/2017    Procedure: POPLITEAL STENT;  Surgeon: Serafina Mitchell, MD;  Location: [facility];  Service: Vascular;  Laterality: Left;     reports that he has quit smoking. He has quit using smokeless tobacco.  His smokeless tobacco use included chew. He reports that he does not drink alcohol and does not use drugs. family history includes Cancer in his brother; Colon cancer in his brother; Coronary artery disease in his father and mother; Diabetes in his mother; Liver disease in his sister; Prostate cancer in his brother.     Allergies  Allergen Reactions   Sulfonamide Derivatives Hives            Outpatient Encounter Medications as of 06/20/2022  Medication Sig   atorvastatin (LIPITOR) 40 MG tablet TAKE 1 TABLET(40 MG) BY MOUTH DAILY    bictegravir-emtricitabine-tenofovir AF (BIKTARVY) 50-200-25 MG TABS tablet Take 1 tablet by mouth daily.   Blood Glucose Monitoring Suppl (FREESTYLE LITE) w/Device KIT 1 Device by Does not apply route daily in the afternoon.   cilostazol (PLETAL) 50 MG tablet TAKE 1 TABLET(50 MG) BY MOUTH TWICE DAILY   clopidogrel (PLAVIX) 75 MG tablet Take 1 tablet (75 mg total) by mouth daily.   Continuous Blood Gluc Receiver (FREESTYLE LIBRE 3 READER) DEVI 1 each by Does not apply route as directed. Use  with freestyle libre 3 sensors to monitor blood sugar continuously   Continuous Blood Gluc Sensor (FREESTYLE LIBRE 3 SENSOR) MISC Place 1 sensor on the skin every 14 days. Use to check glucose continuously   cyanocobalamin (VITAMIN B12) 1000 MCG tablet Take 1 tablet (1,000 mcg total) by mouth daily.   diclofenac Sodium (VOLTAREN) 1 % GEL Apply 4 g topically 4 (four) times daily as needed.   empagliflozin (JARDIANCE) 25 MG TABS tablet Take 1 tablet (25 mg total) by mouth daily before breakfast.   ezetimibe (ZETIA) 10 MG tablet Take 1 tablet (10 mg total) by mouth daily.   glucose blood (FREESTYLE LITE) test strip Use 5 times daily to check blood sugar.   glucose blood (FREESTYLE LITE) test strip 1 each by Other route 3 (three) times daily. Use as instructed   ibuprofen (ADVIL) 400 MG tablet Take by mouth.   injection device for insulin (CEQUR SIMPLICITY 2U) DEVI Apply as directed once every 2 days   Injection Device for Insulin (CEQUR SIMPLICITY INSERTER) MISC Use to Apply a new cecur simplicity patch as directed every 2 days   insulin glargine, 2 Unit Dial, (TOUJEO MAX SOLOSTAR) 300 UNIT/ML Solostar Pen Inject 60 Units into the skin in the morning.   insulin lispro (HUMALOG KWIKPEN) 200 UNIT/ML KwikPen Max daily 100 units   insulin lispro (HUMALOG) 100 UNIT/ML injection To use with Cecur Simplicity insulin patch to take 20 units plus correction before meals 3 times daily with a maximum 100 units/day   Insulin  Pen Needle (BD PEN NEEDLE NANO U/F) 32G X 4 MM MISC 1 Device by Other route in the morning, at noon, in the evening, and at bedtime. USE AS DIRECTED FOUR TIMES DAILY. Dx code  E11.42   Lancet Devices (BAYER MICROLET 2 LANCING DEVIC) MISC Use to check blood sugar  up to 3 ties a day   Lancets (FREESTYLE) lancets Use 5 times daily to check blood sugar.   lipase/protease/amylase (CREON) 36000 UNITS CPEP capsule Take 2 capsules (72,000 Units total) by mouth 3 (three) times daily with meals. May also take 1 capsule (36,000 Units total) as needed (with snacks).   lisinopril (ZESTRIL) 40 MG tablet TAKE 1 TABLET BY MOUTH DAILY. HOLD IF SYSTOLIC BLOOD PRESSURE IS LESS THAN 110   loperamide (IMODIUM) 2 MG capsule Take 1 capsule (2 mg total) by mouth as needed for diarrhea or loose stools.   omeprazole (PRILOSEC) 40 MG capsule TAKE 1 CAPSULE(40 MG) BY MOUTH DAILY   OZEMPIC, 1 MG/DOSE, 4 MG/3ML SOPN INJECT 1 MG UNDER THE SKIN ONCE A WEEK   pioglitazone (ACTOS) 30 MG tablet Take 30 mg by mouth daily.    No facility-administered encounter medications on file as of 06/20/2022.        REVIEW OF SYSTEMS  : All other systems reviewed and negative except where noted in the History of Present Illness.     PHYSICAL EXAM: BP 120/62   Pulse 82   Ht 6\' 1"  (1.854 m)   Wt 183 lb (83 kg)   SpO2 95%   BMI 24.14 kg/m  General: Well developed white male in no acute distress Head: Normocephalic and atraumatic Eyes:  Sclerae anicteric, conjunctiva pink. Ears: Normal auditory acuity Lungs: Clear throughout to auscultation; no W/R/R. Heart: Regular rate and rhythm; no M/R/G. Abdomen: Soft, non-distended.  BS present.  Non-tender. Rectal:  Will be done at the time of colonoscopy. Musculoskeletal: Symmetrical with no gross deformities  Skin: No lesions on visible extremities Extremities: No edema; right AKA Neurological: Alert oriented x 4, grossly non-focal Psychological:  Alert and cooperative. Normal mood and  affect   ASSESSMENT AND PLAN: *Personal history of colon polyps: Last colonoscopy January 2020 with adenomatous polyps removed.  Repeat recommended at 3-year interval per Nathan [name].  Will schedule with Dr. Lyndel Safe in Nathan [name] absence. *Chronic diarrhea: Had a very low pancreatic fecal elastase in November.  Was started on Creon 72,000 units with meals and 36,000 units with snacks.  So far no improvement in symptoms.  Will increase the dosing, will double to 144,000 units with meals and 72000 units with snacks.  Please consider random biopsies during colonoscopy to also rule out microscopic colitis.  Also some of his diabetic medications may be contributing to his diarrhea. *Chronic antiplatelet use with Plavix:  Hold Plavix for 5 days before procedure - will instruct when and how to resume after procedure. Risks and benefits of procedure including bleeding, perforation, infection, missed lesions, medication reactions and possible hospitalization or surgery if complications occur explained. Additional rare but real risk of cardiovascular event such as heart attack or ischemia/infarct of other organs off of Plavix explained and need to seek urgent help if this occurs. Will communicate by phone or EMR with patient's prescribing provider to confirm that holding Plavix is reasonable in this case.   *IDDM:  Insulin will be adjusted prior to endoscopic procedure per protocol. Will resume normal dosing after procedure.      CC:  Delene Ruffini, MD    Attending physician's note   I have taken history, reviewed the chart and examined the patient. I performed a substantive portion of this encounter, including complete performance of at least one of the key components, in conjunction with the APP. I agree with the Advanced Practitioner's note, impression and recommendations.   For colon  today   Carmell Austria, MD [facility] [phone]

## 2022-08-10 NOTE — Patient Instructions (Addendum)
Handouts Provided:  Polyps and Diverticulosis  RESUME Plavix on 08/11/24  YOU HAD AN ENDOSCOPIC PROCEDURE TODAY AT [facility]:   Refer to the procedure report that was given to you for any specific questions about what was found during the examination.  If the procedure report does not answer your questions, please call your gastroenterologist to clarify.  If you requested that your care partner not be given the details of your procedure findings, then the procedure report has been included in a sealed envelope for you to review at your convenience later.  YOU SHOULD EXPECT: Some feelings of bloating in the abdomen. Passage of more gas than usual.  Walking can help get rid of the air that was put into your GI tract during the procedure and reduce the bloating. If you had a lower endoscopy (such as a colonoscopy or flexible sigmoidoscopy) you may notice spotting of blood in your stool or on the toilet paper. If you underwent a bowel prep for your procedure, you may not have a normal bowel movement for a few days.  Please Note:  You might notice some irritation and congestion in your nose or some drainage.  This is from the oxygen used during your procedure.  There is no need for concern and it should clear up in a day or so.  SYMPTOMS TO REPORT IMMEDIATELY:  Following lower endoscopy (colonoscopy or flexible sigmoidoscopy):  Excessive amounts of blood in the stool  Significant tenderness or worsening of abdominal pains  Swelling of the abdomen that is new, acute  Fever of 100F or higher  For urgent or emergent issues, a gastroenterologist can be reached at any hour by calling [phone]. Do not use MyChart messaging for urgent concerns.    DIET:  We do recommend a small meal at first, but then you may proceed to your regular diet.  Drink plenty of fluids but you should avoid alcoholic beverages for 24 hours.  ACTIVITY:  You should plan to take it easy for the rest of today  and you should NOT DRIVE or use heavy machinery until tomorrow (because of the sedation medicines used during the test).    FOLLOW UP: Our staff will call the number listed on your records the next business day following your procedure.  We will call around 7:15- 8:00 am to check on you and address any questions or concerns that you may have regarding the information given to you following your procedure. If we do not reach you, we will leave a message.     If any biopsies were taken you will be contacted by phone or by letter within the next 1-3 weeks.  Please call us at [phone] if you have not heard about the biopsies in 3 weeks.    SIGNATURES/CONFIDENTIALITY: You and/or your care partner have signed paperwork which will be entered into your electronic medical record.  These signatures attest to the fact that that the information above on your After Visit Summary has been reviewed and is understood.  Full responsibility of the confidentiality of this discharge information lies with you and/or your care-partner.

## 2022-08-10 NOTE — Progress Notes (Signed)
Called to room to assist during endoscopic procedure.  Patient ID and intended procedure confirmed with present staff. Received instructions for my participation in the procedure from the performing physician.  

## 2022-08-11 ENCOUNTER — Telehealth: Payer: Self-pay

## 2022-08-11 ENCOUNTER — Telehealth: Payer: Self-pay | Admitting: *Deleted

## 2022-08-11 ENCOUNTER — Other Ambulatory Visit: Payer: Self-pay | Admitting: Internal Medicine

## 2022-08-11 NOTE — Telephone Encounter (Signed)
Attempted to call patient for their post-procedure follow-up call. No answer. Left voicemail.   

## 2022-08-11 NOTE — Telephone Encounter (Signed)
Spoke with Nathan [name], discussed that rectal swab could not be processed and offered appointment for him to come back and recollect. He would like to wait until his next appointment.   Sandie Ano, RN

## 2022-08-20 ENCOUNTER — Encounter: Payer: Self-pay | Admitting: Gastroenterology

## 2022-09-16 ENCOUNTER — Other Ambulatory Visit: Payer: Self-pay | Admitting: Student

## 2022-09-22 ENCOUNTER — Other Ambulatory Visit: Payer: Self-pay | Admitting: Internal Medicine

## 2022-09-22 DIAGNOSIS — K219 Gastro-esophageal reflux disease without esophagitis: Secondary | ICD-10-CM

## 2022-09-25 NOTE — Progress Notes (Unsigned)
Name: Nathan [name]  MRN/ DOB: [id number], [birth year]   Age/ Sex: 72 y.o., male    PCP: Adron Bene, MD (Inactive)   Reason for Endocrinology Evaluation: Type 2 Diabetes Mellitus     Date of Initial Endocrinology Visit: 09/09/2021    PATIENT IDENTIFIER: Nathan [name] is a 72 y.o. male with a past medical history of T2DM, HIV, HTN, GERD and Dyslipidemia. The patient presented for initial endocrinology clinic visit on 09/26/2022 for consultative assistance with his diabetes management.    HPI: Nathan [name] was    Diagnosed with DM 2010 Prior Medications tried/Intolerance: Jardiance started 08/2021. Has metformin intolerance . Started Ozempic 2022          Hemoglobin A1c has ranged from 5.6% in 2019, peaking at 10.5% in 2022.   No prior DKA    On his initial visit to our clinic he had an A1c of 7.2%, we increased Jardiance, continued Ozempic, adjusted MDI regimen  He was restarted on pioglitazone through his PCPs office 07/2022 SUBJECTIVE:   During the last visit (03/28/2022): A1c 9.2%  Today (09/26/22): Nathan [name] is here for a follow up on diabetes management . He checks his blood sugars 1-3 times daily. The patient has not had hypoglycemic episodes since the last clinic visit.   Pt admits to dietary indiscretions , snacks all the time, he eats 3 meals a day.  Patient was seen by dietitian 06/01/2022  He was evaluated by GI for screening colonoscopy  Continues to follow-up with infectious disease for HIV management  He continues with diarrhea  Denies nausea or vomiting  Denies UTI  Mild LE edema    HOME DIABETES REGIMEN: Pioglitazone 30 mg daily Jardiance 25 mg daily  Toujeo 60  units daily  HUmalog 20 units with meal   Ozempic 2 mg weekly ( Tuesdays)  CeQur    Statin: yes ACE-I/ARB: yes    CONTINUOUS GLUCOSE MONITORING RECORD INTERPRETATION    Dates of Recording:   Sensor description:  Results statistics:   CGM use % of time   Average  and SD   Time in range        %  % Time Above 180   % Time above 250   % Time Below target    Glycemic patterns summary:   Hyperglycemic episodes    Hypoglycemic episodes occurred  Overnight periods:    DIABETIC COMPLICATIONS: Microvascular complications:  S/P right Above knee amputations, neuropathy Denies: CKD , retinopathy Last eye exam: Completed 04/04/2022  Macrovascular complications:  PVD Denies: CAD, CVA   PAST HISTORY: Past Medical History:  Past Medical History:  Diagnosis Date   Asthma    per 2003 [facility] pulm records [facility]    Blood dyscrasia    HIV   Clotting disorder (HCC)    Colon polyps    noted previous colonoscopy [facility]   DDD (degenerative disc disease)    cervical spine   Depression    Diabetes mellitus    dx 2010   Diarrhea 02/24/2021   Dizziness 08/25/2021   Falls 08/25/2021   Fever    unkn[place] origin   GERD (gastroesophageal reflux disease)    Head swelling 05/23/2017   Hep C w/o coma, chronic (HCC)    History of syphilis    noted [facility] records   HIV infection (HCC)    undetectable viral load and CD4 ct 667 as of 11/2011   Hyperlipidemia    Hypertension    MRSA (methicillin resistant Staphylococcus  aureus)    Multiple sclerosis (HCC)    in remission as of 11/2011 (diagnosed late 1980s)   Pain in limb-Right Leg 10/13/2013   Pancreas (digestive gland) works poorly    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HCC)    2002   Pneumonia    Prosthesis fitting 03/10/2019   PVD (peripheral vascular disease) (HCC)    Left Stent 07/02/2008   Scalp lesion 07/26/2017   UTI (urinary tract infection)    Wears dentures    Past Surgical History:  Past Surgical History:  Procedure Laterality Date   ABDOMINAL AORTOGRAM W/LOWER EXTREMITY N/A 08/07/2017   Procedure: ABDOMINAL AORTOGRAM W/LOWER EXTREMITY;  Surgeon: Nada Libman, MD;  Location: [facility] INVASIVE [facility];  Service: Cardiovascular;  Laterality: N/A;   ABOVE KNEE LEG AMPUTATION     Right Leg    COLONOSCOPY W/ BIOPSIES AND POLYPECTOMY     LOWER EXTREMITY ANGIOGRAM Left 12/26/2011   Procedure: LOWER EXTREMITY ANGIOGRAM;  Surgeon: Nada Libman, MD;  Location: [facility] CATH LAB;  Service: Cardiovascular;  Laterality: Left;   LOWER EXTREMITY ANGIOGRAM Left 08/17/2017   Procedure: LOWER EXTREMITY ANGIOGRAM LEFT LEG WITH RUNOFF AND Stenting.;  Surgeon: Nada Libman, MD;  Location: [facility] OR;  Service: Vascular;  Laterality: Left;   MULTIPLE TOOTH EXTRACTIONS     OTHER SURGICAL HISTORY     right lower ext AKA with prothesis    OTHER SURGICAL HISTORY     left left with stents (Dr. Osie Cheeks)   OTHER SURGICAL HISTORY     2003 colonoscopy 5 mm polyp transverse colon; (2)72mm  polyps in rectum-hyperplastic   PERIPHERAL VASCULAR INTERVENTION Left 08/17/2017   Procedure: POPLITEAL STENT;  Surgeon: Nada Libman, MD;  Location: [facility] OR;  Service: Vascular;  Laterality: Left;    Social History:  reports that he has quit smoking. He has never been exposed to tobacco smoke. He has quit using smokeless tobacco.  His smokeless tobacco use included chew. He reports that he does not drink alcohol and does not use drugs. Family History:  Family History  Problem Relation Age of Onset   Diabetes Mother    Coronary artery disease Mother    Coronary artery disease Father    Liver disease Sister    Cancer Brother        colon caner stage 4 as of 11/2011 (unknown age of onset)   Prostate cancer Brother    Colon cancer Brother    Rectal cancer Neg Hx    Esophageal cancer Neg Hx      HOME MEDICATIONS: Allergies as of 09/26/2022       Reactions   Sulfonamide Derivatives Hives        Medication List        Accurate as of Sep 26, 2022 10:56 AM. If you have any questions, ask your nurse or doctor.          STOP taking these medications    doxycycline 100 MG tablet Commonly known as: VIBRA-TABS Stopped by: Scarlette Shorts, MD   HumaLOG KwikPen 200 UNIT/ML KwikPen Generic drug:  insulin lispro Stopped by: Scarlette Shorts, MD   insulin lispro 100 UNIT/ML injection Commonly known as: HumaLOG Replaced by: HumaLOG 100 UNIT/ML injection Stopped by: Scarlette Shorts, MD       TAKE these medications    atorvastatin 40 MG tablet Commonly known as: LIPITOR TAKE 1 TABLET(40 MG) BY MOUTH DAILY   Bayer Microlet 2 Lancing Devic Misc Use to check blood sugar  up to 3 ties a day   BD Pen Needle Nano U/F 32G X 4 MM Misc Generic drug: Insulin Pen Needle 1 Device by Other route daily in the afternoon. USE AS DIRECTED FOUR TIMES DAILY. Dx code  E11.42 What changed: when to take this Changed by: Scarlette Shorts, MD   Biktarvy 50-200-25 MG Tabs tablet Generic drug: bictegravir-emtricitabine-tenofovir AF Take 1 tablet by mouth daily.   CeQur Simplicity 2U Devi Generic drug: injection device for insulin Apply as directed once every 2 days   CeQur Simplicity Inserter Misc Use to Apply a new cecur simplicity patch as directed every 2 days   cilostazol 50 MG tablet Commonly known as: PLETAL TAKE 1 TABLET(50 MG) BY MOUTH TWICE DAILY   clopidogrel 75 MG tablet Commonly known as: PLAVIX Take 1 tablet (75 mg total) by mouth daily.   diclofenac Sodium 1 % Gel Commonly known as: Voltaren Apply 4 g topically 4 (four) times daily as needed.   empagliflozin 25 MG Tabs tablet Commonly known as: Jardiance Take 1 tablet (25 mg total) by mouth daily before breakfast.   ezetimibe 10 MG tablet Commonly known as: Zetia Take 1 tablet (10 mg total) by mouth daily.   freestyle lancets Use 5 times daily to check blood sugar.   FreeStyle Libre 3 Reader [place] 1 each by Does not apply route as directed. Use  with freestyle libre 3 sensors to monitor blood sugar continuously   FreeStyle Libre 3 Sensor Misc Place 1 sensor on the skin every 14 days. Use to check glucose continuously   FREESTYLE LITE test strip Generic drug: glucose blood Use 5 times daily to  check blood sugar.   FREESTYLE LITE test strip Generic drug: glucose blood 1 each by Other route 3 (three) times daily. Use as instructed   Contour Next Test test strip Generic drug: glucose blood USE TO CHECK BLOOD SUGAR FIVE TIMES DAILY   FreeStyle Lite w/Device Kit 1 Device by Does not apply route daily in the afternoon.   HumaLOG 100 UNIT/ML injection Generic drug: insulin lispro Max daily 110 units per CeQur Replaces: insulin lispro 100 UNIT/ML injection Started by: Scarlette Shorts, MD   ibuprofen 400 MG tablet Commonly known as: ADVIL Take by mouth.   Insulin Syringe-Needle U-100 30G X 5/16" 1 ML Misc Commonly known as: GNP Insulin Syringes 1 Device by Does not apply route daily in the afternoon. Started by: Scarlette Shorts, MD   Creon 36000 UNITS Cpep capsule Generic drug: lipase/protease/amylase Take by mouth.   lipase/protease/amylase [address] UNITS Cpep capsule Commonly known as: Creon Take 4 capsules (144,000 Units total) by mouth 3 (three) times daily before meals. Take four capsule with meals and take two capsules with snacks   lisinopril 40 MG tablet Commonly known as: ZESTRIL TAKE 1 TABLET BY MOUTH DAILY. HOLD IF SYSTOLIC BLOOD PRESSURE IS LESS THAN 110   loperamide 2 MG capsule Commonly known as: IMODIUM Take 1 capsule (2 mg total) by mouth as needed for diarrhea or loose stools.   omeprazole 40 MG capsule Commonly known as: PRILOSEC TAKE 1 CAPSULE(40 MG) BY MOUTH DAILY   Ozempic (2 MG/DOSE) 8 MG/3ML Sopn Generic drug: Semaglutide (2 MG/DOSE) 2 mg by Other route once a week. What changed: See the new instructions. Changed by: Scarlette Shorts, MD   pioglitazone 30 MG tablet Commonly known as: ACTOS Take 1 tablet (30 mg total) by mouth daily.   Toujeo Max SoloStar 300 UNIT/ML Solostar Pen Generic drug:  insulin glargine (2 Unit Dial) Inject 60 Units into the skin in the morning.         ALLERGIES: Allergies  Allergen  Reactions   Sulfonamide Derivatives Hives     REVIEW OF SYSTEMS: A comprehensive ROS was conducted with the patient and is negative except as per HPI    OBJECTIVE:   VITAL SIGNS: BP 126/80 (BP Location: Left Arm, Patient Position: Sitting, Cuff Size: Small)   Pulse 67   Ht 6\' 1"  (1.854 m)   Wt 183 lb 6.4 oz (83.2 kg)   SpO2 96%   BMI 24.20 kg/m    PHYSICAL EXAM:  General: Pt appears well and is in NAD  Lungs: Clear with good BS bilat   Heart: RRR   Neuro: MS is good with appropriate affect, pt is alert and Ox3   DM Foot Exam 09/26/2022 RBKA Left foot : No pulses  Sensation absent      DATA REVIEWED:  Lab Results  Component Value Date   HGBA1C 8.7 (A) 09/26/2022   HGBA1C 9.8 (A) 08/03/2022   HGBA1C 9.2 (A) 03/28/2022    Latest Reference Range & Units 11/22/21 10:24  Sodium 135 - 146 mmol/L 141  Potassium 3.5 - 5.3 mmol/L 4.0  Chloride 98 - 110 mmol/L 107  CO2 20 - 32 mmol/L 25  Glucose 65 - 99 mg/dL 161 (H)  BUN 7 - 25 mg/dL 10  Creatinine 0.96 - 0.45 mg/dL 4.09  Calcium 8.6 - 81.1 mg/dL 9.7  BUN/Creatinine Ratio 6 - 22 (calc) NOT APPLICABLE  eGFR > OR = 60 mL/min/1.91m2 72  (H): Data is abnormally high   ASSESSMENT / PLAN / RECOMMENDATIONS:   1) Type 2 Diabetes Mellitus, Optimally controlled, With Neuropathic and  macrovascular complications - Most recent A1c of 9.2 %. Goal A1c < 7.0 %.     - A1c trended up from 7.2% to 9.2% , pt with dietary indiscretions and unable to control this, he eats all day long  - CeQur is not on the formulary , V-go is but the max is 40 units of basal insulin a day and this will not be enough for him  -I did recommend insulin pump technology, patient does not have a cell phone as he is unable to learn how to use it, we have opted to avoid insulin pump technology as the patient has learning disability -He had tried the Dexcom in the past but did have difficulty using it and opted to remain on fingerstick -Unable to increase  Ozempic due to rectal incontinence, he opted to remain on Ozempic as it has helped him -I have switched Humalog to pens -A new prescription for Freestyle lite meter and extra strips were sent to the pharmacy -Patient advised to check glucose before each meal and to use the correction scale that will be provided today   MEDICATIONS: Continue Jardiance 25 mg, 1 tablet daily Continue Ozempic 1 mg weekly (Tuesdays) Increase Toujeo 60 units daily Continue Humalog 20 units with each meal  Start correction factor : Humalog ( BG-  EDUCATION / INSTRUCTIONS: BG monitoring instructions: Patient is instructed to check his blood sugars 3 times a day, before meals. Call [facility] [facility] if: BG persistently < 70  I reviewed the Rule of 15 for the treatment of hypoglycemia in detail with the patient. Literature supplied.   2) Diabetic complications:  Eye: Does not have known diabetic retinopathy.  Neuro/ Feet: Does  have known diabetic peripheral neuropathy. Renal: Patient does  not have known baseline CKD. He is  on an ACEI/ARB at present.  3) Dyslipidemia :    -LDL and TG at goal -No change   Medication  Continue atorvastatin 40 mg daily   4) Left Knee lesion    - Pt with 0.5 cm left lateral   Follow-up in 6 months    Signed electronically by: Lyndle Herrlich, MD  [facility]  [facility] [address] [place], [place] [address] Phone: [phone] FAX: [phone]   CC: Adron Bene, MD (Inactive) No address on file Phone: None  Fax: None    Return to Endocrinology clinic as below: Future Appointments  Date Time Provider Department Center  12/07/2022  9:30 AM Daiva Eves, Lisette Grinder, MD [facility]  01/01/2023 12:10 PM Alicya Bena, Konrad Dolores, MD [facility] None  01/22/2023  1:00 PM [facility]  01/22/2023  1:30 PM [facility]  01/22/2023  2:00 PM [facility]  01/22/2023  2:40 PM Nada Libman, MD [facility]

## 2022-09-26 ENCOUNTER — Encounter: Payer: Self-pay | Admitting: Internal Medicine

## 2022-09-26 ENCOUNTER — Ambulatory Visit (INDEPENDENT_AMBULATORY_CARE_PROVIDER_SITE_OTHER): Payer: Medicare (Managed Care) | Admitting: Internal Medicine

## 2022-09-26 VITALS — BP 126/80 | HR 67 | Ht 73.0 in | Wt 183.4 lb

## 2022-09-26 DIAGNOSIS — S80212A Abrasion, left knee, initial encounter: Secondary | ICD-10-CM | POA: Diagnosis not present

## 2022-09-26 DIAGNOSIS — E7800 Pure hypercholesterolemia, unspecified: Secondary | ICD-10-CM

## 2022-09-26 DIAGNOSIS — E785 Hyperlipidemia, unspecified: Secondary | ICD-10-CM | POA: Diagnosis not present

## 2022-09-26 DIAGNOSIS — Z7985 Long-term (current) use of injectable non-insulin antidiabetic drugs: Secondary | ICD-10-CM | POA: Diagnosis not present

## 2022-09-26 DIAGNOSIS — Z7984 Long term (current) use of oral hypoglycemic drugs: Secondary | ICD-10-CM | POA: Diagnosis not present

## 2022-09-26 DIAGNOSIS — E1151 Type 2 diabetes mellitus with diabetic peripheral angiopathy without gangrene: Secondary | ICD-10-CM | POA: Diagnosis not present

## 2022-09-26 DIAGNOSIS — Z794 Long term (current) use of insulin: Secondary | ICD-10-CM | POA: Diagnosis not present

## 2022-09-26 DIAGNOSIS — E1169 Type 2 diabetes mellitus with other specified complication: Secondary | ICD-10-CM

## 2022-09-26 DIAGNOSIS — S80212D Abrasion, left knee, subsequent encounter: Secondary | ICD-10-CM

## 2022-09-26 LAB — POCT GLYCOSYLATED HEMOGLOBIN (HGB A1C): Hemoglobin A1C: 8.7 % — AB (ref 4.0–5.6)

## 2022-09-26 MED ORDER — DOXYCYCLINE HYCLATE 100 MG PO TBEC: 100.0000 mg | DELAYED_RELEASE_TABLET | Freq: Two times a day (BID) | ORAL | 0 refills | Status: DC

## 2022-09-26 MED ORDER — HUMALOG 100 UNIT/ML IJ SOLN: INTRAMUSCULAR | 3 refills | Status: DC

## 2022-09-26 MED ORDER — PIOGLITAZONE HCL 30 MG PO TABS: 30.0000 mg | ORAL_TABLET | Freq: Every day | ORAL | 3 refills | Status: DC

## 2022-09-26 MED ORDER — OZEMPIC (2 MG/DOSE) 8 MG/3ML ~~LOC~~ SOPN: 2.0000 mg | PEN_INJECTOR | SUBCUTANEOUS | 3 refills | Status: DC

## 2022-09-26 MED ORDER — FREESTYLE LIBRE 3 SENSOR MISC
3 refills | Status: DC
Start: 2022-09-26 — End: 2023-01-10

## 2022-09-26 MED ORDER — BD PEN NEEDLE NANO U/F 32G X 4 MM MISC
1.0000 | Freq: Every day | 3 refills | Status: DC
Start: 2022-09-26 — End: 2023-05-25

## 2022-09-26 MED ORDER — "INSULIN SYRINGE-NEEDLE U-100 30G X 5/16"" 1 ML MISC": 1.0000 | Freq: Every day | 3 refills | Status: AC

## 2022-09-26 MED ORDER — TOUJEO MAX SOLOSTAR 300 UNIT/ML ~~LOC~~ SOPN
60.0000 [IU] | PEN_INJECTOR | Freq: Every morning | SUBCUTANEOUS | 3 refills | Status: DC
Start: 2022-09-26 — End: 2023-01-01

## 2022-09-26 NOTE — Patient Instructions (Addendum)
Continue Pioglitazone 30 mg daily  Continue Jardiance 25 mg, 1 tablet every morning  daily  Continue Ozempic 2 mg weekly  Continue  Toujeo 60  units every morning  Increase HUmalog 24 units with each meal  ( 12 clicks) Humalog correctional insulin: ADD extra units on insulin to your meal-time Humalog dose if your blood sugars are higher than 155. Use the scale below to help guide you:   Blood sugar before meal Number of units to inject  Less than 155 0 unit  156 -  180 1 units  181 -  205 2 units  206 -  230 3 units  231 -  255 4 units  256 -  280 5 units  281 -  305 6 units  306 -  330 7 units  331 -  355 8 units  356 - 380 9 units       HOW TO TREAT LOW BLOOD SUGARS (Blood sugar LESS THAN 70 MG/DL) Please follow the RULE OF 15 for the treatment of hypoglycemia treatment (when your (blood sugars are less than 70 mg/dL)   STEP 1: Take 15 grams of carbohydrates when your blood sugar is low, which includes:  3-4 GLUCOSE TABS  OR 3-4 OZ OF JUICE OR REGULAR SODA OR ONE TUBE OF GLUCOSE GEL    STEP 2: RECHECK blood sugar in 15 MINUTES STEP 3: If your blood sugar is still low at the 15 minute recheck --> then, go back to STEP 1 and treat AGAIN with another 15 grams of carbohydrates.

## 2022-09-27 ENCOUNTER — Encounter: Payer: Self-pay | Admitting: Internal Medicine

## 2022-09-27 MED ORDER — ATORVASTATIN CALCIUM 40 MG PO TABS: ORAL_TABLET | ORAL | 3 refills | Status: DC

## 2022-09-28 ENCOUNTER — Telehealth: Payer: Self-pay | Admitting: Dietician

## 2022-09-28 NOTE — Telephone Encounter (Signed)
Outreach to provide diabetes support. Patient feels his is doing well on Freestyle libre 3 and Cecur simplicity insulin patch. He asks when he is due for a return appointment.

## 2022-10-04 NOTE — Telephone Encounter (Signed)
Called Nathan [name] about his follow up appointment with our office. He pointed out that is is already scheduled for an A1C late August with Dr. Lonzo Cloud, his endocrinologist.  He asks if he still needs to see [place] in 3 months.

## 2022-10-10 ENCOUNTER — Telehealth: Payer: Self-pay

## 2022-10-10 ENCOUNTER — Other Ambulatory Visit (HOSPITAL_COMMUNITY): Payer: Self-pay

## 2022-10-10 NOTE — Telephone Encounter (Signed)
Patient Advocate Encounter   Received notification from [facility] that prior authorization is required for Doxycycline Hyclate 100MG  dr tablets  Per test claim ; Insurance will cover the capsules, not the tablets

## 2022-10-11 ENCOUNTER — Encounter: Payer: Self-pay | Admitting: *Deleted

## 2022-10-11 MED ORDER — DOXYCYCLINE MONOHYDRATE 100 MG PO CAPS: 100.0000 mg | ORAL_CAPSULE | Freq: Two times a day (BID) | ORAL | 0 refills | Status: DC

## 2022-12-05 ENCOUNTER — Other Ambulatory Visit: Payer: Self-pay | Admitting: Internal Medicine

## 2022-12-05 DIAGNOSIS — E7800 Pure hypercholesterolemia, unspecified: Secondary | ICD-10-CM

## 2022-12-05 DIAGNOSIS — E1169 Type 2 diabetes mellitus with other specified complication: Secondary | ICD-10-CM

## 2022-12-07 ENCOUNTER — Ambulatory Visit: Payer: Medicare (Managed Care) | Admitting: Infectious Disease

## 2022-12-07 NOTE — Progress Notes (Deleted)
Chief complaint: Follow-up for HIV disease on Biktarvy   Patient ID: Nathan [name], male    DOB: 12-27-1950, 72 y.o.   MRN: [id number]  HPI  Nathan [name] is a 72 y.o. male whose HIV has been perfectly suppressed on Biktarvy and BID AZT  We ran [employer] which is shown below:      He has done well on Jasper alone since then.  [name] returns for routine follow-up and is doing well and in good spirits.       Past Medical History:  Diagnosis Date   Asthma    per 2003 [facility] pulm records pfts    Blood dyscrasia    HIV   Clotting disorder (HCC)    Colon polyps    noted previous colonoscopy [facility]   DDD (degenerative disc disease)    cervical spine   Depression    Diabetes mellitus    dx 2010   Diarrhea 02/24/2021   Dizziness 08/25/2021   Falls 08/25/2021   Fever    unknwon origin   GERD (gastroesophageal reflux disease)    Head swelling 05/23/2017   Hep C w/o coma, chronic (HCC)    History of syphilis    noted [facility] records   HIV infection (HCC)    undetectable viral load and CD4 ct 667 as of 11/2011   Hyperlipidemia    Hypertension    MRSA (methicillin resistant Staphylococcus aureus)    Multiple sclerosis (HCC)    in remission as of 11/2011 (diagnosed late 1980s)   Pain in limb-Right Leg 10/13/2013   Pancreas (digestive gland) works poorly    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HCC)    2002   Pneumonia    Prosthesis fitting 03/10/2019   PVD (peripheral vascular disease) (HCC)    Left Stent 07/02/2008   Scalp lesion 07/26/2017   UTI (urinary tract infection)    Wears dentures     Past Surgical History:  Procedure Laterality Date   ABDOMINAL AORTOGRAM W/LOWER EXTREMITY N/A 08/07/2017   Procedure: ABDOMINAL AORTOGRAM W/LOWER EXTREMITY;  Surgeon: Nada Libman, MD;  [facility];  Service: Cardiovascular;  Laterality: N/A;   ABOVE KNEE LEG AMPUTATION     Right Leg   COLONOSCOPY W/ BIOPSIES AND POLYPECTOMY     LOWER EXTREMITY  ANGIOGRAM Left 12/26/2011   Procedure: LOWER EXTREMITY ANGIOGRAM;  Surgeon: Nada Libman, MD;  Location: [facility] CATH LAB;  Service: Cardiovascular;  Laterality: Left;   LOWER EXTREMITY ANGIOGRAM Left 08/17/2017   Procedure: LOWER EXTREMITY ANGIOGRAM LEFT LEG WITH RUNOFF AND Stenting.;  Surgeon: Nada Libman, MD;  Location: [facility] OR;  Service: Vascular;  Laterality: Left;   MULTIPLE TOOTH EXTRACTIONS     OTHER SURGICAL HISTORY     right lower ext AKA with prothesis    OTHER SURGICAL HISTORY     left left with stents (Dr. Osie Cheeks)   OTHER SURGICAL HISTORY     2003 colonoscopy 5 mm polyp transverse colon; (2)33mm  polyps in rectum-hyperplastic   PERIPHERAL VASCULAR INTERVENTION Left 08/17/2017   Procedure: POPLITEAL STENT;  Surgeon: Nada Libman, MD;  [facility]: [facility] OR;  Service: Vascular;  Laterality: Left;    Family History  Problem Relation Age of Onset   Diabetes Mother    Coronary artery disease Mother    Coronary artery disease Father    Liver disease Sister    Cancer Brother        colon caner stage 4 as of 11/2011 (unknown age  of onset)   Prostate cancer Brother    Colon cancer Brother    Rectal cancer Neg Hx    Esophageal cancer Neg Hx       Social History   Socioeconomic History   Marital status: Single    Spouse name: Not on file   Number of children: 1   Years of education: 12   Highest education level: Not on file  Occupational History   Occupation: retired  Tobacco Use   Smoking status: Former    Passive exposure: Never   Smokeless tobacco: Former    Types: Associate Professor status: Never Used  Substance and Sexual Activity   Alcohol use: No    Alcohol/week: 0.0 standard drinks of alcohol   Drug use: No   Sexual activity: Yes    Partners: Male    Comment: declined condoms  Other Topics Concern   Not on file  Social History Narrative   Not on file   Social Determinants of Health   Financial Resource Strain: Low Risk  (04/04/2022)    Overall Financial Resource Strain (CARDIA)    Difficulty of Paying Living Expenses: Not hard at all  Food Insecurity: Food Insecurity Present (04/04/2022)   Hunger Vital Sign    Worried About Running Out of Food in the Last Year: Sometimes true    Ran Out of Food in the Last Year: Never true  Transportation Needs: No Transportation Needs (04/04/2022)   PRAPARE - Administrator, Civil Service (Medical): No    Lack of Transportation (Non-Medical): No  Physical Activity: Insufficiently Active (04/04/2022)   Exercise Vital Sign    Days of Exercise per Week: 1 day    Minutes of Exercise per Session: 20 min  Stress: No Stress Concern Present (04/04/2022)   [employer] of Occupational Health - Occupational Stress Questionnaire    Feeling of Stress : Not at all  Social Connections: Socially Isolated (04/04/2022)   Social Connection and Isolation Panel [NHANES]    Frequency of Communication with Friends and Family: Never    Frequency of Social Gatherings with Friends and Family: Once a week    Attends Religious Services: Never    Database administrator or Organizations: No    Attends Engineer, structural: Never    Marital Status: Divorced    Allergies  Allergen Reactions   Sulfonamide Derivatives Hives     Current Outpatient Medications:    atorvastatin (LIPITOR) 40 MG tablet, TAKE 1 TABLET(40 MG) BY MOUTH DAILY, Disp: 90 tablet, Rfl: 1   bictegravir-emtricitabine-tenofovir AF (BIKTARVY) 50-200-25 MG TABS tablet, Take 1 tablet by mouth daily., Disp: 30 tablet, Rfl: 11   Blood Glucose Monitoring Suppl (FREESTYLE LITE) w/Device KIT, 1 Device by Does not apply route daily in the afternoon., Disp: 1 kit, Rfl: 0   cilostazol (PLETAL) 50 MG tablet, TAKE 1 TABLET(50 MG) BY MOUTH TWICE DAILY, Disp: 60 tablet, Rfl: 3   clopidogrel (PLAVIX) 75 MG tablet, Take 1 tablet (75 mg total) by mouth daily., Disp: 90 tablet, Rfl: 3   Continuous Blood Gluc Receiver (FREESTYLE LIBRE  3 READER) DEVI, 1 each by Does not apply route as directed. Use  with freestyle libre 3 sensors to monitor blood sugar continuously, Disp: 1 each, Rfl: 0   Continuous Glucose Sensor (FREESTYLE LIBRE 3 SENSOR) MISC, Place 1 sensor on the skin every 14 days. Use to check glucose continuously, Disp: 6 each, Rfl: 3   diclofenac Sodium (  VOLTAREN) 1 % GEL, Apply 4 g topically 4 (four) times daily as needed., Disp: 100 g, Rfl: 1   doxycycline (DORYX) 100 MG EC tablet, Take 1 tablet (100 mg total) by mouth 2 (two) times daily., Disp: 20 tablet, Rfl: 0   doxycycline (MONODOX) 100 MG capsule, Take 1 capsule (100 mg total) by mouth 2 (two) times daily., Disp: 20 capsule, Rfl: 0   empagliflozin (JARDIANCE) 25 MG TABS tablet, Take 1 tablet (25 mg total) by mouth daily before breakfast., Disp: 90 tablet, Rfl: 3   ezetimibe (ZETIA) 10 MG tablet, Take 1 tablet (10 mg total) by mouth daily., Disp: 90 tablet, Rfl: 2   glucose blood (CONTOUR NEXT TEST) test strip, USE TO CHECK BLOOD SUGAR FIVE TIMES DAILY, Disp: 400 strip, Rfl: 3   glucose blood (FREESTYLE LITE) test strip, Use 5 times daily to check blood sugar., Disp: 450 each, Rfl: 3   glucose blood (FREESTYLE LITE) test strip, 1 each by Other route 3 (three) times daily. Use as instructed, Disp: 300 each, Rfl: 3   HUMALOG 100 UNIT/ML injection, Max daily 110 units per CeQur, Disp: 110 mL, Rfl: 3   ibuprofen (ADVIL) 400 MG tablet, Take by mouth., Disp: , Rfl:    injection device for insulin (CEQUR SIMPLICITY 2U) DEVI, Apply as directed once every 2 days, Disp: 45 each, Rfl: 3   Injection Device for Insulin (CEQUR SIMPLICITY INSERTER) MISC, Use to Apply a new cecur simplicity patch as directed every 2 days, Disp: 1 each, Rfl: 1   insulin glargine, 2 Unit Dial, (TOUJEO MAX SOLOSTAR) 300 UNIT/ML Solostar Pen, Inject 60 Units into the skin in the morning., Disp: 18 mL, Rfl: 3   Insulin Pen Needle (BD PEN NEEDLE NANO U/F) 32G X 4 MM MISC, 1 Device by Other route daily in  the afternoon. USE AS DIRECTED FOUR TIMES DAILY. Dx code  E11.42, Disp: 100 each, Rfl: 3   Insulin Syringe-Needle U-100 (GNP INSULIN SYRINGES) 30G X 5/16" 1 ML MISC, 1 Device by Does not apply route daily in the afternoon., Disp: 100 each, Rfl: 3   Lancet Devices (BAYER MICROLET 2 LANCING DEVIC) MISC, Use to check blood sugar up to 3 ties a day, Disp: 1 each, Rfl: 2   Lancets (FREESTYLE) lancets, Use 5 times daily to check blood sugar., Disp: 450 each, Rfl: 3   lipase/protease/amylase (CREON) 36000 UNITS CPEP capsule, Take 4 capsules (144,000 Units total) by mouth 3 (three) times daily before meals. Take four capsule with meals and take two capsules with snacks, Disp: 480 capsule, Rfl: 11   lipase/protease/amylase (CREON) 36000 UNITS CPEP capsule, Take by mouth., Disp: , Rfl:    lisinopril (ZESTRIL) 40 MG tablet, TAKE 1 TABLET BY MOUTH DAILY. HOLD IF SYSTOLIC BLOOD PRESSURE IS LESS THAN 110, Disp: 90 tablet, Rfl: 3   loperamide (IMODIUM) 2 MG capsule, Take 1 capsule (2 mg total) by mouth as needed for diarrhea or loose stools., Disp: 120 capsule, Rfl: 0   omeprazole (PRILOSEC) 40 MG capsule, TAKE 1 CAPSULE(40 MG) BY MOUTH DAILY, Disp: 90 capsule, Rfl: 1   pioglitazone (ACTOS) 30 MG tablet, Take 1 tablet (30 mg total) by mouth daily., Disp: 90 tablet, Rfl: 3   Semaglutide, 2 MG/DOSE, (OZEMPIC, 2 MG/DOSE,) 8 MG/3ML SOPN, 2 mg by Other route once a week., Disp: 9 mL, Rfl: 3  Review of Systems     Objective:   Physical Exam    Hyperlipidemia: continue atorvastatin  PVD: he is continuing cilostozal,  plavix  DM he is on insulin, ozempic, jardianz  History of colonic polyps he is going for colonoscopy later this week  Vaccine counseling: recommended repeat COVID 19 updated booster which he received

## 2022-12-31 NOTE — Progress Notes (Unsigned)
Name: Nathan [name]  MRN/ DOB: [id number], [birth year]   Age/ Sex: 72 y.o., male    PCP: Nathan Messier, MD   Reason for Endocrinology Evaluation: Type 2 Diabetes Mellitus     Date of Initial Endocrinology Visit: 09/09/2021    PATIENT IDENTIFIER: [name] is a 72 y.o. male with a past medical history of T2DM, HIV, HTN, GERD and Dyslipidemia. The patient presented for initial endocrinology clinic visit on 01/01/2023 for consultative assistance with his diabetes management.    HPI: Nathan [name] was    Diagnosed with DM 2010 Prior Medications tried/Intolerance: Jardiance started 08/2021. Has metformin intolerance . Started Ozempic 2022          Hemoglobin A1c has ranged from 5.6% in 2019, peaking at 10.5% in 2022.   No prior DKA    On his initial visit to our clinic he had an A1c of 7.2%, we increased Jardiance, continued Ozempic, adjusted MDI regimen  He was restarted on pioglitazone through his PCPs office 07/2022 SUBJECTIVE:   During the last visit (09/26/2022): A1c 8.7%     Today (01/01/23): Nathan [name] is here for a follow up on diabetes management . He checks his blood sugars 1-3 times daily. The patient has had hypoglycemic episodes since the last clinic visit. He is not symptomatic   Continues to follow-up with infectious disease for HIV management  He continues with diarrhea , he was onCreon through his PCP but he stopped it due to gas and bloating  Denies nausea or vomiting   Mild Left LE edema    HOME DIABETES REGIMEN: Pioglitazone 30 mg daily Jardiance 25 mg daily  Toujeo 60  units daily  HUmalog 24 units with meal   Ozempic 2 mg weekly ( Tuesdays)  CeQur    Statin: yes ACE-I/ARB: yes    CONTINUOUS GLUCOSE MONITORING RECORD INTERPRETATION    Dates of Recording: 8/13-8/26/2024  Sensor description: Freestyle libre 3  Results statistics:   CGM use % of time 92  Average and SD 208/31.3  Time in range   37  %  % Time Above 180 36  %  Time above 250 27  % Time Below target 0   Glycemic patterns summary:Bg's trend to optimal levels at night and increase during the day  Hyperglycemic episodes postprandial  Hypoglycemic episodes occurred N/A  Overnight periods: Trends down   DIABETIC COMPLICATIONS: Microvascular complications:  S/P right Above knee amputations, neuropathy Denies: CKD , retinopathy Last eye exam: Completed 04/04/2022  Macrovascular complications:  PVD Denies: CAD, CVA   PAST HISTORY: Past Medical History:  Past Medical History:  Diagnosis Date   Asthma    per 2003 [facility] pulm records pfts    Blood dyscrasia    HIV   Clotting disorder (HCC)    Colon polyps    noted previous colonoscopy [facility]   DDD (degenerative disc disease)    cervical spine   Depression    Diabetes mellitus    dx 2010   Diarrhea 02/24/2021   Dizziness 08/25/2021   Falls 08/25/2021   Fever    unknwon origin   GERD (gastroesophageal reflux disease)    Head swelling 05/23/2017   Hep C w/o coma, chronic (HCC)    History of syphilis    noted [facility] records   HIV infection (HCC)    undetectable viral load and CD4 ct 667 as of 11/2011   Hyperlipidemia    Hypertension    MRSA (methicillin resistant Staphylococcus aureus)  Multiple sclerosis (HCC)    in remission as of 11/2011 (diagnosed late 1980s)   Pain in limb-Right Leg 10/13/2013   Pancreas (digestive gland) works poorly    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HCC)    2002   Pneumonia    Prosthesis fitting 03/10/2019   PVD (peripheral vascular disease) (HCC)    Left Stent 07/02/2008   Scalp lesion 07/26/2017   UTI (urinary tract infection)    Wears dentures    Past Surgical History:  Past Surgical History:  Procedure Laterality Date   ABDOMINAL AORTOGRAM W/LOWER EXTREMITY N/A 08/07/2017   Procedure: ABDOMINAL AORTOGRAM W/LOWER EXTREMITY;  Surgeon: Nada Libman, MD;  [facility]: [facility] INVASIVE CV LAB;  Service: Cardiovascular;  Laterality: N/A;   ABOVE  KNEE LEG AMPUTATION     Right Leg   COLONOSCOPY W/ BIOPSIES AND POLYPECTOMY     LOWER EXTREMITY ANGIOGRAM Left 12/26/2011   Procedure: LOWER EXTREMITY ANGIOGRAM;  Surgeon: Nada Libman, MD;  Location: [facility] CATH LAB;  Service: Cardiovascular;  Laterality: Left;   LOWER EXTREMITY ANGIOGRAM Left 08/17/2017   Procedure: LOWER EXTREMITY ANGIOGRAM LEFT LEG WITH RUNOFF AND Stenting.;  Surgeon: Nada Libman, MD;  Location: [facility] OR;  Service: Vascular;  Laterality: Left;   MULTIPLE TOOTH EXTRACTIONS     OTHER SURGICAL HISTORY     right lower ext AKA with prothesis    OTHER SURGICAL HISTORY     left left with stents (Dr. Osie Cheeks)   OTHER SURGICAL HISTORY     2003 colonoscopy 5 mm polyp transverse colon; (2)60mm  polyps in rectum-hyperplastic   PERIPHERAL VASCULAR INTERVENTION Left 08/17/2017   Procedure: POPLITEAL STENT;  Surgeon: Nada Libman, MD;  Location: [facility] OR;  Service: Vascular;  Laterality: Left;    Social History:  reports that he has quit smoking. He has never been exposed to tobacco smoke. He has quit using smokeless tobacco.  His smokeless tobacco use included chew. He reports that he does not drink alcohol and does not use drugs. Family History:  Family History  Problem Relation Age of Onset   Diabetes Mother    Coronary artery disease Mother    Coronary artery disease Father    Liver disease Sister    Cancer Brother        colon caner stage 4 as of 11/2011 (unknown age of onset)   Prostate cancer Brother    Colon cancer Brother    Rectal cancer Neg Hx    Esophageal cancer Neg Hx      HOME MEDICATIONS: Allergies as of 01/01/2023       Reactions   Sulfonamide Derivatives Hives        Medication List        Accurate as of January 01, 2023 12:14 PM. If you have any questions, ask your nurse or doctor.          STOP taking these medications    doxycycline 100 MG EC tablet Commonly known as: DORYX Stopped by: Johnney Ou Aydien Majette       TAKE these  medications    atorvastatin 40 MG tablet Commonly known as: LIPITOR TAKE 1 TABLET(40 MG) BY MOUTH DAILY   Bayer Microlet 2 Lancing Devic Misc Use to check blood sugar up to 3 ties a day   BD Pen Needle Nano U/F 32G X 4 MM Misc Generic drug: Insulin Pen Needle 1 Device by Other route daily in the afternoon. USE AS DIRECTED FOUR TIMES DAILY. Dx code  [phone]  Biktarvy 50-200-25 MG Tabs tablet Generic drug: bictegravir-emtricitabine-tenofovir AF Take 1 tablet by mouth daily.   CeQur Simplicity 2U Devi Generic drug: injection device for insulin Apply as directed once every 2 days   CeQur Simplicity Inserter Misc Use to Apply a new cecur simplicity patch as directed every 2 days   cilostazol 50 MG tablet Commonly known as: PLETAL TAKE 1 TABLET(50 MG) BY MOUTH TWICE DAILY   clopidogrel 75 MG tablet Commonly known as: PLAVIX Take 1 tablet (75 mg total) by mouth daily.   diclofenac Sodium 1 % Gel Commonly known as: Voltaren Apply 4 g topically 4 (four) times daily as needed.   doxycycline 100 MG capsule Commonly known as: MONODOX Take 1 capsule (100 mg total) by mouth 2 (two) times daily.   empagliflozin 25 MG Tabs tablet Commonly known as: Jardiance Take 1 tablet (25 mg total) by mouth daily before breakfast.   ezetimibe 10 MG tablet Commonly known as: Zetia Take 1 tablet (10 mg total) by mouth daily.   freestyle lancets Use 5 times daily to check blood sugar.   FreeStyle Libre 3 Reader [place] 1 each by Does not apply route as directed. Use  with freestyle libre 3 sensors to monitor blood sugar continuously   FreeStyle Libre 3 Sensor Misc Place 1 sensor on the skin every 14 days. Use to check glucose continuously   FREESTYLE LITE test strip Generic drug: glucose blood Use 5 times daily to check blood sugar.   FREESTYLE LITE test strip Generic drug: glucose blood 1 each by Other route 3 (three) times daily. Use as instructed   Contour Next Test test  strip Generic drug: glucose blood USE TO CHECK BLOOD SUGAR FIVE TIMES DAILY   FreeStyle Lite w/Device Kit 1 Device by Does not apply route daily in the afternoon.   HumaLOG 100 UNIT/ML injection Generic drug: insulin lispro Max daily 110 units per CeQur   ibuprofen 400 MG tablet Commonly known as: ADVIL Take by mouth.   Insulin Syringe-Needle U-100 30G X 5/16" 1 ML Misc Commonly known as: GNP Insulin Syringes 1 Device by Does not apply route daily in the afternoon.   Creon 36000 UNITS Cpep capsule Generic drug: lipase/protease/amylase Take by mouth.   lipase/protease/amylase 09604 UNITS Cpep capsule Commonly known as: Creon Take 4 capsules (144,000 Units total) by mouth 3 (three) times daily before meals. Take four capsule with meals and take two capsules with snacks   lisinopril 40 MG tablet Commonly known as: ZESTRIL TAKE 1 TABLET BY MOUTH DAILY. HOLD IF SYSTOLIC BLOOD PRESSURE IS LESS THAN 110   loperamide 2 MG capsule Commonly known as: IMODIUM Take 1 capsule (2 mg total) by mouth as needed for diarrhea or loose stools.   omeprazole 40 MG capsule Commonly known as: PRILOSEC TAKE 1 CAPSULE(40 MG) BY MOUTH DAILY   Ozempic (2 MG/DOSE) 8 MG/3ML Sopn Generic drug: Semaglutide (2 MG/DOSE) 2 mg by Other route once a week.   pioglitazone 30 MG tablet Commonly known as: ACTOS Take 1 tablet (30 mg total) by mouth daily.   Toujeo Max SoloStar 300 UNIT/ML Solostar Pen Generic drug: insulin glargine (2 Unit Dial) Inject 60 Units into the skin in the morning.         ALLERGIES: Allergies  Allergen Reactions   Sulfonamide Derivatives Hives     REVIEW OF SYSTEMS: A comprehensive ROS was conducted with the patient and is negative except as per HPI    OBJECTIVE:   VITAL SIGNS: BP Marland Kitchen)  150/60   Pulse 78   Ht 6\' 1"  (1.854 m)   Wt 188 lb (85.3 kg)   SpO2 90%   BMI 24.80 kg/m    PHYSICAL EXAM:  General: Pt appears well and is in NAD  Lungs: Clear with good  BS bilat   Heart: RRR   Neuro: MS is good with appropriate affect, pt is alert and Ox3   DM Foot Exam 09/26/2022 RBKA Left foot : No pulses  Sensation absent      DATA REVIEWED:  Lab Results  Component Value Date   HGBA1C 8.2 (A) 01/01/2023   HGBA1C 8.7 (A) 09/26/2022   HGBA1C 9.8 (A) 08/03/2022    Latest Reference Range & Units 08/07/22 10:05  BUN 7 - 25 mg/dL 21  Creatinine 1.61 - 0.96 mg/dL 0.45  Calcium 8.6 - 40.9 mg/dL 9.6  BUN/Creatinine Ratio 6 - 22 (calc) SEE NOTE:  eGFR > OR = 60 mL/min/1.55m2 61  AG Ratio 1.0 - 2.5 (calc) 1.8  AST 10 - 35 U/L 24  ALT 9 - 46 U/L 23  Total Protein 6.1 - 8.1 g/dL 7.2  Total Bilirubin 0.2 - 1.2 mg/dL 0.5      Latest Reference Range & Units 08/07/22 10:05  Total CHOL/HDL Ratio <5.0 (calc) 3.0  Cholesterol <200 mg/dL 811  HDL Cholesterol > OR = 40 mg/dL 38 (L)  LDL Cholesterol (Calc) mg/dL (calc) 55  Non-HDL Cholesterol (Calc) <130 mg/dL (calc) 76  Triglycerides <150 mg/dL 914    ASSESSMENT / PLAN / RECOMMENDATIONS:   1) Type 2 Diabetes Mellitus, Poorly controlled, With Neuropathic and  macrovascular complications - Most recent A1c of 8.2 %. Goal A1c < 7.0 %.    - A1c is trending down  -He continues to use the CeQur, he has only been given 20-day supply, a prescription for 45 sick use was sent to the pharmacy for 49-month supply -Patient opted to avoid insulin pump technology as he endorsed learning difficulty -His Ozempic was increased to his PCPs office, patient does not endorse worsening rectal incontinence/diarrhea  on the higher dose -He is on pioglitazone through PCPs office, no edema  -In reviewing his CGM data, patient has been noted with tight BG's after breakfast but hyperglycemia after lunch and supper, I will adjust his Humalog dose accordingly   MEDICATIONS: Continue pioglitazone 30 mg daily Continue Jardiance 25 mg, 1 tablet daily Continue Ozempic 2 mg weekly (Tuesdays) Continue Toujeo 60 units daily Change   Humalog 20 units with breakfast, 28 units with lunch and supper  continue correction factor : Humalog ( BG-130/25) TIDQAC  EDUCATION / INSTRUCTIONS: BG monitoring instructions: Patient is instructed to check his blood sugars 3 times a day, before meals. Call [place] [facility] if: BG persistently < 70  I reviewed the Rule of 15 for the treatment of hypoglycemia in detail with the patient. Literature supplied.   2) Diabetic complications:  Eye: Does not have known diabetic retinopathy.  Neuro/ Feet: Does  have known diabetic peripheral neuropathy. Renal: Patient does not have known baseline CKD. He is  on an ACEI/ARB at present.  3) Dyslipidemia :   -LDL and TG remain at goal  Medication  Continue atorvastatin 40 mg daily    Follow-up in 6 months    Signed electronically by: Lyndle Herrlich, MD  [facility] Endocrinology  [facility] [address] [place], [place] [address] Phone: [phone] FAX: [phone]   CC: Nathan Messier, MD [address]  [place] [address] Phone: [phone]  Fax: [phone]    Return to Endocrinology clinic as below: Future Appointments  Date Time Provider Department Center  01/10/2023  9:45 AM Nooruddin, Jason Fila, MD [facility]  01/22/2023 10:30 AM [facility]  01/22/2023 11:00 AM [facility]  01/22/2023 11:30 AM [facility] 4 [facility]  01/22/2023 12:20 PM Nada Libman, MD [facility]

## 2023-01-01 ENCOUNTER — Encounter: Payer: Self-pay | Admitting: Internal Medicine

## 2023-01-01 ENCOUNTER — Ambulatory Visit (INDEPENDENT_AMBULATORY_CARE_PROVIDER_SITE_OTHER): Payer: Medicare (Managed Care) | Admitting: Internal Medicine

## 2023-01-01 VITALS — BP 150/60 | HR 78 | Ht 73.0 in | Wt 188.0 lb

## 2023-01-01 DIAGNOSIS — E1142 Type 2 diabetes mellitus with diabetic polyneuropathy: Secondary | ICD-10-CM

## 2023-01-01 DIAGNOSIS — Z7984 Long term (current) use of oral hypoglycemic drugs: Secondary | ICD-10-CM

## 2023-01-01 DIAGNOSIS — Z794 Long term (current) use of insulin: Secondary | ICD-10-CM | POA: Diagnosis not present

## 2023-01-01 DIAGNOSIS — E1165 Type 2 diabetes mellitus with hyperglycemia: Secondary | ICD-10-CM | POA: Diagnosis not present

## 2023-01-01 DIAGNOSIS — E1169 Type 2 diabetes mellitus with other specified complication: Secondary | ICD-10-CM

## 2023-01-01 DIAGNOSIS — E785 Hyperlipidemia, unspecified: Secondary | ICD-10-CM

## 2023-01-01 DIAGNOSIS — E1151 Type 2 diabetes mellitus with diabetic peripheral angiopathy without gangrene: Secondary | ICD-10-CM

## 2023-01-01 LAB — POCT GLYCOSYLATED HEMOGLOBIN (HGB A1C): Hemoglobin A1C: 8.2 % — AB (ref 4.0–5.6)

## 2023-01-01 MED ORDER — CEQUR SIMPLICITY 2U DEVI: 1.0000 | Freq: Once | 3 refills | Status: DC

## 2023-01-01 MED ORDER — OZEMPIC (2 MG/DOSE) 8 MG/3ML ~~LOC~~ SOPN: 2.0000 mg | PEN_INJECTOR | SUBCUTANEOUS | 3 refills | Status: DC

## 2023-01-01 MED ORDER — TOUJEO MAX SOLOSTAR 300 UNIT/ML ~~LOC~~ SOPN
60.0000 [IU] | PEN_INJECTOR | Freq: Every morning | SUBCUTANEOUS | 3 refills | Status: DC
Start: 2023-01-01 — End: 2023-05-25

## 2023-01-01 NOTE — Patient Instructions (Addendum)
Continue Pioglitazone 30 mg daily  Continue Jardiance 25 mg, 1 tablet every morning  daily  Continue Ozempic 2 mg weekly  Continue  Toujeo 60  units every morning  Change HUmalog 10 clicks with Breakfast, 14 click with Lunch and Supper   Humalog correctional insulin: ADD extra units on insulin to your meal-time Humalog dose if your blood sugars are higher than 155. Use the scale below to help guide you:   Blood sugar before meal Number of units to inject  Less than 155 0 unit  156 -  180 1 units  181 -  205 2 units  206 -  230 3 units  231 -  255 4 units  256 -  280 5 units  281 -  305 6 units  306 -  330 7 units  331 -  355 8 units  356 - 380 9 units       HOW TO TREAT LOW BLOOD SUGARS (Blood sugar LESS THAN 70 MG/DL) Please follow the RULE OF 15 for the treatment of hypoglycemia treatment (when your (blood sugars are less than 70 mg/dL)   STEP 1: Take 15 grams of carbohydrates when your blood sugar is low, which includes:  3-4 GLUCOSE TABS  OR 3-4 OZ OF JUICE OR REGULAR SODA OR ONE TUBE OF GLUCOSE GEL    STEP 2: RECHECK blood sugar in 15 MINUTES STEP 3: If your blood sugar is still low at the 15 minute recheck --> then, go back to STEP 1 and treat AGAIN with another 15 grams of carbohydrates.

## 2023-01-02 ENCOUNTER — Encounter: Payer: Self-pay | Admitting: Internal Medicine

## 2023-01-10 ENCOUNTER — Telehealth: Payer: Self-pay | Admitting: Dietician

## 2023-01-10 ENCOUNTER — Ambulatory Visit (INDEPENDENT_AMBULATORY_CARE_PROVIDER_SITE_OTHER): Payer: Medicare (Managed Care) | Admitting: Student

## 2023-01-10 ENCOUNTER — Other Ambulatory Visit: Payer: Self-pay

## 2023-01-10 ENCOUNTER — Encounter: Payer: Self-pay | Admitting: Student

## 2023-01-10 VITALS — BP 135/62 | HR 73 | Temp 97.7°F | Resp 32 | Ht 73.0 in | Wt 188.8 lb

## 2023-01-10 DIAGNOSIS — Z23 Encounter for immunization: Secondary | ICD-10-CM | POA: Diagnosis not present

## 2023-01-10 DIAGNOSIS — E1169 Type 2 diabetes mellitus with other specified complication: Secondary | ICD-10-CM

## 2023-01-10 DIAGNOSIS — Z794 Long term (current) use of insulin: Secondary | ICD-10-CM | POA: Diagnosis not present

## 2023-01-10 DIAGNOSIS — K8689 Other specified diseases of pancreas: Secondary | ICD-10-CM

## 2023-01-10 DIAGNOSIS — E1151 Type 2 diabetes mellitus with diabetic peripheral angiopathy without gangrene: Secondary | ICD-10-CM | POA: Diagnosis not present

## 2023-01-10 DIAGNOSIS — B20 Human immunodeficiency virus [HIV] disease: Secondary | ICD-10-CM | POA: Diagnosis not present

## 2023-01-10 DIAGNOSIS — Z0000 Encounter for general adult medical examination without abnormal findings: Secondary | ICD-10-CM

## 2023-01-10 MED ORDER — FREESTYLE LIBRE 3 SENSOR MISC
3 refills | Status: AC
Start: 2023-01-10 — End: ?

## 2023-01-10 MED ORDER — FREESTYLE LITE TEST VI STRP: ORAL_STRIP | 12 refills | Status: DC

## 2023-01-10 MED ORDER — FREESTYLE LIBRE 3 READER DEVI
1.0000 | 0 refills | Status: DC
Start: 2023-01-10 — End: 2023-05-25

## 2023-01-10 NOTE — Telephone Encounter (Signed)
Patient lost his CGM reader and was afraid it might not be covered. Call to pharmacy and [facility] reader requires a PA which is being done now

## 2023-01-10 NOTE — Progress Notes (Signed)
CC: Checkup  HPI:  [name] is a 72 y.o. male living with a history stated below and presents today for checkup. Please see problem based assessment and plan for additional details.  Past Medical History:  Diagnosis Date   Asthma    per 2003 [facility] pulm records pfts    Blood dyscrasia    HIV   Clotting disorder (HCC)    Colon polyps    noted previous colonoscopy [facility]   DDD (degenerative disc disease)    cervical spine   Depression    Diabetes mellitus    dx 2010   Diarrhea 02/24/2021   Dizziness 08/25/2021   Falls 08/25/2021   Fever    unknwon origin   GERD (gastroesophageal reflux disease)    Head swelling 05/23/2017   Hep C w/o coma, chronic (HCC)    History of syphilis    noted [facility] records   HIV infection (HCC)    undetectable viral load and CD4 ct 667 as of 11/2011   Hyperlipidemia    Hypertension    MRSA (methicillin resistant Staphylococcus aureus)    Multiple sclerosis (HCC)    in remission as of 11/2011 (diagnosed late 1980s)   Pain in limb-Right Leg 10/13/2013   Pancreas (digestive gland) works poorly    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HCC)    2002   Pneumonia    Prosthesis fitting 03/10/2019   PVD (peripheral vascular disease) (HCC)    Left Stent 07/02/2008   Scalp lesion 07/26/2017   UTI (urinary tract infection)    Wears dentures     Current Outpatient Medications on File Prior to Visit  Medication Sig Dispense Refill   atorvastatin (LIPITOR) 40 MG tablet TAKE 1 TABLET(40 MG) BY MOUTH DAILY 90 tablet 1   bictegravir-emtricitabine-tenofovir AF (BIKTARVY) 50-200-25 MG TABS tablet Take 1 tablet by mouth daily. 30 tablet 11   Blood Glucose Monitoring Suppl (FREESTYLE LITE) w/Device KIT 1 Device by Does not apply route daily in the afternoon. 1 kit 0   cilostazol (PLETAL) 50 MG tablet TAKE 1 TABLET(50 MG) BY MOUTH TWICE DAILY 60 tablet 3   clopidogrel (PLAVIX) 75 MG tablet Take 1 tablet (75 mg total) by mouth daily. 90 tablet 3    diclofenac Sodium (VOLTAREN) 1 % GEL Apply 4 g topically 4 (four) times daily as needed. 100 g 1   empagliflozin (JARDIANCE) 25 MG TABS tablet Take 1 tablet (25 mg total) by mouth daily before breakfast. 90 tablet 3   ezetimibe (ZETIA) 10 MG tablet Take 1 tablet (10 mg total) by mouth daily. 90 tablet 2   glucose blood (CONTOUR NEXT TEST) test strip USE TO CHECK BLOOD SUGAR FIVE TIMES DAILY 400 strip 3   glucose blood (FREESTYLE LITE) test strip Use 5 times daily to check blood sugar. (Patient not taking: Reported on 01/01/2023) 450 each 3   glucose blood (FREESTYLE LITE) test strip 1 each by Other route 3 (three) times daily. Use as instructed (Patient not taking: Reported on 01/01/2023) 300 each 3   HUMALOG 100 UNIT/ML injection Max daily 110 units per CeQur 110 mL 3   ibuprofen (ADVIL) 400 MG tablet Take by mouth.     injection device for insulin (CEQUR SIMPLICITY 2U) DEVI Apply as directed once every 2 days 45 each 3   injection device for insulin (CEQUR SIMPLICITY 2U) DEVI 1 Device by Other route once for 1 dose. 45 each 3   Injection Device for Insulin (CEQUR SIMPLICITY INSERTER) MISC Use  to Apply a new cecur simplicity patch as directed every 2 days 1 each 1   insulin glargine, 2 Unit Dial, (TOUJEO MAX SOLOSTAR) 300 UNIT/ML Solostar Pen Inject 60 Units into the skin in the morning. 18 mL 3   Insulin Pen Needle (BD PEN NEEDLE NANO U/F) 32G X 4 MM MISC 1 Device by Other route daily in the afternoon. USE AS DIRECTED FOUR TIMES DAILY. Dx code  E11.42 100 each 3   Insulin Syringe-Needle U-100 (GNP INSULIN SYRINGES) 30G X 5/16" 1 ML MISC 1 Device by Does not apply route daily in the afternoon. 100 each 3   Lancet Devices (BAYER MICROLET 2 LANCING DEVIC) MISC Use to check blood sugar up to 3 ties a day 1 each 2   Lancets (FREESTYLE) lancets Use 5 times daily to check blood sugar. 450 each 3   lipase/protease/amylase (CREON) 36000 UNITS CPEP capsule Take 4 capsules (144,000 Units total) by mouth 3  (three) times daily before meals. Take four capsule with meals and take two capsules with snacks (Patient not taking: Reported on 01/01/2023) 480 capsule 11   lipase/protease/amylase (CREON) 36000 UNITS CPEP capsule Take by mouth.     lisinopril (ZESTRIL) 40 MG tablet TAKE 1 TABLET BY MOUTH DAILY. HOLD IF SYSTOLIC BLOOD PRESSURE IS LESS THAN 110 90 tablet 3   omeprazole (PRILOSEC) 40 MG capsule TAKE 1 CAPSULE(40 MG) BY MOUTH DAILY 90 capsule 1   pioglitazone (ACTOS) 30 MG tablet Take 1 tablet (30 mg total) by mouth daily. 90 tablet 3   Semaglutide, 2 MG/DOSE, (OZEMPIC, 2 MG/DOSE,) 8 MG/3ML SOPN 2 mg by Other route once a week. 9 mL 3   No current facility-administered medications on file prior to visit.    Family History  Problem Relation Age of Onset   Diabetes Mother    Coronary artery disease Mother    Coronary artery disease Father    Liver disease Sister    Cancer Brother        colon caner stage 4 as of 11/2011 (unknown age of onset)   Prostate cancer Brother    Colon cancer Brother    Rectal cancer Neg Hx    Esophageal cancer Neg Hx     Social History   Socioeconomic History   Marital status: Single    Spouse name: Not on file   Number of children: 1   Years of education: 12   Highest education level: Not on file  Occupational History   Occupation: retired  Tobacco Use   Smoking status: Former    Passive exposure: Never   Smokeless tobacco: Former    Types: Associate Professor status: Never Used  Substance and Sexual Activity   Alcohol use: No    Alcohol/week: 0.0 standard drinks of alcohol   Drug use: No   Sexual activity: Yes    Partners: Male    Comment: declined condoms  Other Topics Concern   Not on file  Social History Narrative   Not on file   Social Determinants of Health   Financial Resource Strain: Low Risk  (04/04/2022)   Overall Financial Resource Strain (CARDIA)    Difficulty of Paying Living Expenses: Not hard at all  Food Insecurity:  Food Insecurity Present (04/04/2022)   Hunger Vital Sign    Worried About Running Out of Food in the Last Year: Sometimes true    Ran Out of Food in the Last Year: Never true  Transportation Needs: No Transportation  Needs (04/04/2022)   PRAPARE - Administrator, Civil Service (Medical): No    Lack of Transportation (Non-Medical): No  Physical Activity: Insufficiently Active (04/04/2022)   Exercise Vital Sign    Days of Exercise per Week: 1 day    Minutes of Exercise per Session: 20 min  Stress: No Stress Concern Present (04/04/2022)   [employer] of Occupational Health - Occupational Stress Questionnaire    Feeling of Stress : Not at all  Social Connections: Socially Isolated (04/04/2022)   Social Connection and Isolation Panel [NHANES]    Frequency of Communication with Friends and Family: Never    Frequency of Social Gatherings with Friends and Family: Once a week    Attends Religious Services: Never    Database administrator or Organizations: No    Attends Banker Meetings: Never    Marital Status: Divorced  Catering manager Violence: Not At Risk (04/04/2022)   Humiliation, Afraid, Rape, and Kick questionnaire    Fear of Current or Ex-Partner: No    Emotionally Abused: No    Physically Abused: No    Sexually Abused: No    Review of Systems: ROS negative except for what is noted on the assessment and plan.  Vitals:   01/10/23 0936  BP: 135/62  Pulse: 73  Resp: (!) 32  Temp: 97.7 F (36.5 C)  TempSrc: Oral  SpO2: 95%  Weight: 188 lb 12.8 oz (85.6 kg)  Height: 6\' 1"  (1.854 m)    Physical Exam: Constitutional: well-appearing male in no acute distress HENT: normocephalic atraumatic, mucous membranes moist Eyes: conjunctiva non-erythematous Neck: supple Cardiovascular: regular rate and rhythm, no m/r/g Pulmonary/Chest: normal work of breathing on room air, lungs clear to auscultation bilaterally Abdominal: soft, non-tender,  non-distended MSK: normal bulk and tone, right AKA with prosthetic Skin: warm and dry Psych: Normal mood and affect  Assessment & Plan:   Pancreatic insufficiency Patient was diagnosed in November 2023 after fecal elastase came back less than 50.  Subsequently, he was started on Creon capsules.  His diarrhea did not improve significantly, and when he was evaluated by GI his Creon dosage was increased.  He has not been taking his Creon for the last 3 to 4 weeks due to significant pill burden.  Pancreatic insufficiency likely in the setting of type 2 diabetes uncontrolled.  Plan: - Will recheck fecal elastase and see if Creon supplementation that is necessary  Type 2 DM with diabetic peripheral angiopathy w/o gangrene (HCC) A1c currently at 8.2%, and patient does follow with endocrinology.  His current regimen is pioglitazone 30 mg, Jardiance 25 mg, Toujeo 60 units in the morning, Humalog 20 units with breakfast and 28 units with lunch and dinner, and Ozempic 2 mg weekly.  He is compliant with his regimen currently, and just needs refills of his glucose sensor.  Will refrain from making any changes to his regimen as he was just seen by his endocrinologist on August 26.  Plan: - Will follow-up in 3 to 6 months - Refill freestyle lite sensor  Healthcare maintenance Flu shot given today  HIV disease (HCC) In good control, last HIV viral load was in April which was undetectable.  Good CD4 count as well.  Will continue Biktarvy.  Patient discussed with Dr. Lennon Alstrom Ivanell Deshotel, M.D. [facility], PGY-2 Pager: [phone] Date 01/10/2023 Time 3:23 PM

## 2023-01-10 NOTE — Assessment & Plan Note (Signed)
A1c currently at 8.2%, and patient does follow with endocrinology.  His current regimen is pioglitazone 30 mg, Jardiance 25 mg, Toujeo 60 units in the morning, Humalog 20 units with breakfast and 28 units with lunch and dinner, and Ozempic 2 mg weekly.  He is compliant with his regimen currently, and just needs refills of his glucose sensor.  Will refrain from making any changes to his regimen as he was just seen by his endocrinologist on August 26.  Plan: - Will follow-up in 3 to 6 months - Refill freestyle lite sensor

## 2023-01-10 NOTE — Assessment & Plan Note (Signed)
Flu shot given today

## 2023-01-10 NOTE — Assessment & Plan Note (Signed)
Patient was diagnosed in November 2023 after fecal elastase came back less than 50.  Subsequently, he was started on Creon capsules.  His diarrhea did not improve significantly, and when he was evaluated by GI his Creon dosage was increased.  He has not been taking his Creon for the last 3 to 4 weeks due to significant pill burden.  Pancreatic insufficiency likely in the setting of type 2 diabetes uncontrolled.  Plan: - Will recheck fecal elastase and see if Creon supplementation that is necessary

## 2023-01-10 NOTE — Patient Instructions (Addendum)
Thank you so much for coming to the clinic today!   It was a pleasure meeting you!  For your diarrhea, we are going to recheck that elastase level that we talked about.  You can also get loperamide (Imodium) over-the-counter.  Once a fecal elastase comes back, I will give you a phone call to see if we need to continue the Creon or not.  In the meanwhile, I have sent refills of your sensor on hopeful that you will able to pick it up.  Continue your diabetic regimen.   If you have any questions please feel free to the call the clinic at anytime at [phone]. It was a pleasure seeing you!  Best, Dr. Thomasene Ripple

## 2023-01-10 NOTE — Assessment & Plan Note (Signed)
In good control, last HIV viral load was in April which was undetectable.  Good CD4 count as well.  Will continue Biktarvy.

## 2023-01-11 MED ORDER — FREESTYLE LIBRE 3 SENSOR MISC
3 refills | Status: DC
Start: 2023-01-11 — End: 2023-05-25

## 2023-01-11 NOTE — Progress Notes (Signed)
Internal Medicine Clinic Attending  Case discussed with the resident at the time of the visit.  We reviewed the resident's history and exam and pertinent patient test results.  I agree with the assessment, diagnosis, and plan of care documented in the resident's note.  

## 2023-01-11 NOTE — Addendum Note (Signed)
Addended by: Dickie La on: 01/11/2023 02:16 PM   Modules accepted: Level of Service

## 2023-01-11 NOTE — Addendum Note (Signed)
Addended byOlegario Messier on: 01/11/2023 09:39 AM   Modules accepted: Orders

## 2023-01-13 LAB — PANCREATIC ELASTASE, FECAL: Pancreatic Elastase, Fecal: 23 ug Elast./g — ABNORMAL LOW (ref >200)

## 2023-01-15 ENCOUNTER — Other Ambulatory Visit: Payer: Self-pay

## 2023-01-15 ENCOUNTER — Other Ambulatory Visit: Payer: Self-pay | Admitting: Student

## 2023-01-15 DIAGNOSIS — I10 Essential (primary) hypertension: Secondary | ICD-10-CM

## 2023-01-15 MED ORDER — PANCRELIPASE (LIP-PROT-AMYL) 36000-114000 UNITS PO CPEP: 36000.0000 [IU] | ORAL_CAPSULE | Freq: Three times a day (TID) | ORAL | 3 refills | Status: DC

## 2023-01-15 MED ORDER — LISINOPRIL 40 MG PO TABS
ORAL_TABLET | ORAL | 3 refills | Status: DC
Start: 2023-01-15 — End: 2024-02-04

## 2023-01-15 NOTE — Telephone Encounter (Signed)
Patient will need to call his insurance to see how they go about replacing readers. A lot of plans only cover one per lifetime. Mel Almond attempted the PA but it was denied.

## 2023-01-15 NOTE — Telephone Encounter (Signed)
Call to patient to let him know his FL3 reader was denied coverage by his insurance company. He has been checking with his meter and says he will continue to do so. I advised him to appeal the denial or try to fil it once a year has gone by. He verbalized understanding.

## 2023-01-22 ENCOUNTER — Ambulatory Visit (INDEPENDENT_AMBULATORY_CARE_PROVIDER_SITE_OTHER)
Admission: RE | Admit: 2023-01-22 | Discharge: 2023-01-22 | Disposition: A | Discharge status: Home / Self Care | Payer: Medicare (Managed Care) | Source: Ambulatory Visit | Attending: Surgery | Admitting: Surgery

## 2023-01-22 ENCOUNTER — Encounter: Payer: Self-pay | Admitting: Surgery

## 2023-01-22 ENCOUNTER — Ambulatory Visit (INDEPENDENT_AMBULATORY_CARE_PROVIDER_SITE_OTHER): Payer: Medicare (Managed Care) | Admitting: Surgery

## 2023-01-22 ENCOUNTER — Ambulatory Visit (HOSPITAL_COMMUNITY)
Admission: RE | Admit: 2023-01-22 | Discharge: 2023-01-22 | Disposition: A | Discharge status: Home / Self Care | Payer: Medicare (Managed Care) | Source: Ambulatory Visit | Attending: Surgery | Admitting: Surgery

## 2023-01-22 VITALS — BP 135/75 | HR 60 | Temp 97.8°F | Resp 20 | Ht 73.0 in | Wt 185.0 lb

## 2023-01-22 DIAGNOSIS — I70213 Atherosclerosis of native arteries of extremities with intermittent claudication, bilateral legs: Secondary | ICD-10-CM | POA: Insufficient documentation

## 2023-01-22 DIAGNOSIS — I6523 Occlusion and stenosis of bilateral carotid arteries: Secondary | ICD-10-CM

## 2023-01-22 DIAGNOSIS — I739 Peripheral vascular disease, unspecified: Secondary | ICD-10-CM

## 2023-01-22 LAB — VAS US ABI WITH/WO TBI: Left ABI: 0.76

## 2023-01-22 NOTE — Progress Notes (Signed)
[facility]  Patient name: Nathan [name] MRN: [id number] DOB: November 21, 1950 Sex: male   REASON FOR VISIT:    Follow up  HISOTRY OF PRESENT ILLNESS:    Nathan [name] is a 72 y.o. male who is back today for follow-up.  He initially presented with severe right leg pain in 2008.  He underwent a right femoral to below-knee popliteal artery bypass graft with saphenous vein on 02/26/2007.  This occluded and on 11/12/2007 he had a redo bypass with Gore-Tex.  He ultimately ended up with a right above-knee amputation.  In 2010 he began having left leg claudication and underwent popliteal artery stenting.  This was repeated on 08/17/2017 in the OR via a antegrade approach because of her high bifurcation.    His symptoms remain stable.  He denies any neurologic issues such as numbness or weakness in either extremity, slurred speech, or amaurosis fugax.  He continues to work on diabetes management.  He continues to take a statin.    PAST MEDICAL HISTORY:   Past Medical History:  Diagnosis Date   Asthma    per 2003 [facility] pulm records pfts    Blood dyscrasia    HIV   Clotting disorder (HCC)    Colon polyps    noted previous colonoscopy [facility]   DDD (degenerative disc disease)    cervical spine   Depression    Diabetes mellitus    dx 2010   Diarrhea 02/24/2021   Dizziness 08/25/2021   Falls 08/25/2021   Fever    unknwon origin   GERD (gastroesophageal reflux disease)    Head swelling 05/23/2017   Hep C w/o coma, chronic (HCC)    History of syphilis    noted [facility] records   HIV infection (HCC)    undetectable viral load and CD4 ct 667 as of 11/2011   Hyperlipidemia    Hypertension    MRSA (methicillin resistant Staphylococcus aureus)    Multiple sclerosis (HCC)    in remission as of 11/2011 (diagnosed late 1980s)   Pain in limb-Right Leg 10/13/2013   Pancreas (digestive gland) works poorly    PCP (pneumocystis jiroveci  pneumonia) (HCC)    2002   Pneumonia    Prosthesis fitting 03/10/2019   PVD (peripheral vascular disease) (HCC)    Left Stent 07/02/2008   Scalp lesion 07/26/2017   UTI (urinary tract infection)    Wears dentures      FAMILY HISTORY:   Family History  Problem Relation Age of Onset   Diabetes Mother    Coronary artery disease Mother    Coronary artery disease Father    Liver disease Sister    Cancer Brother        colon caner stage 4 as of 11/2011 (unknown age of onset)   Prostate cancer Brother    Colon cancer Brother    Rectal cancer Neg Hx    Esophageal cancer Neg Hx     SOCIAL HISTORY:   Social History   Tobacco Use   Smoking status: Former    Passive exposure: Never   Smokeless tobacco: Former    Types: Chew  Substance Use Topics   Alcohol use: No    Alcohol/week: 0.0 standard drinks of alcohol     ALLERGIES:   Allergies  Allergen Reactions   Sulfonamide Derivatives Hives     CURRENT MEDICATIONS:   Current Outpatient Medications  Medication Sig Dispense Refill   atorvastatin (LIPITOR) 40 MG tablet TAKE  1 TABLET(40 MG) BY MOUTH DAILY 90 tablet 1   bictegravir-emtricitabine-tenofovir AF (BIKTARVY) 50-200-25 MG TABS tablet Take 1 tablet by mouth daily. 30 tablet 11   Blood Glucose Monitoring Suppl (FREESTYLE LITE) w/Device KIT 1 Device by Does not apply route daily in the afternoon. 1 kit 0   cilostazol (PLETAL) 50 MG tablet TAKE 1 TABLET(50 MG) BY MOUTH TWICE DAILY 60 tablet 3   clopidogrel (PLAVIX) 75 MG tablet Take 1 tablet (75 mg total) by mouth daily. 90 tablet 3   Continuous Glucose Receiver (FREESTYLE LIBRE 3 READER) DEVI 1 each by Does not apply route as directed. Use  with freestyle libre 3 sensors to monitor blood sugar continuously 1 each 0   Continuous Glucose Sensor (FREESTYLE LIBRE 3 SENSOR) MISC Place 1 sensor on the skin every 14 days. Use to check glucose continuously 6 each 3   diclofenac Sodium (VOLTAREN) 1 % GEL Apply 4 g topically 4  (four) times daily as needed. 100 g 1   empagliflozin (JARDIANCE) 25 MG TABS tablet Take 1 tablet (25 mg total) by mouth daily before breakfast. 90 tablet 3   ezetimibe (ZETIA) 10 MG tablet Take 1 tablet (10 mg total) by mouth daily. 90 tablet 2   glucose blood (CONTOUR NEXT TEST) test strip USE TO CHECK BLOOD SUGAR FIVE TIMES DAILY 400 strip 3   glucose blood (FREESTYLE LITE) test strip Use 5 times daily to check blood sugar. 450 each 3   glucose blood (FREESTYLE LITE) test strip 1 each by Other route 3 (three) times daily. Use as instructed 300 each 3   glucose blood (FREESTYLE LITE) test strip Use as instructed 100 each 12   HUMALOG 100 UNIT/ML injection Max daily 110 units per CeQur 110 mL 3   ibuprofen (ADVIL) 400 MG tablet Take by mouth.     injection device for insulin (CEQUR SIMPLICITY 2U) DEVI Apply as directed once every 2 days 45 each 3   Injection Device for Insulin (CEQUR SIMPLICITY INSERTER) MISC Use to Apply a new cecur simplicity patch as directed every 2 days 1 each 1   insulin glargine, 2 Unit Dial, (TOUJEO MAX SOLOSTAR) 300 UNIT/ML Solostar Pen Inject 60 Units into the skin in the morning. 18 mL 3   Insulin Pen Needle (BD PEN NEEDLE NANO U/F) 32G X 4 MM MISC 1 Device by Other route daily in the afternoon. USE AS DIRECTED FOUR TIMES DAILY. Dx code  E11.42 100 each 3   Insulin Syringe-Needle U-100 (GNP INSULIN SYRINGES) 30G X 5/16" 1 ML MISC 1 Device by Does not apply route daily in the afternoon. 100 each 3   Lancet Devices (BAYER MICROLET 2 LANCING DEVIC) MISC Use to check blood sugar up to 3 ties a day 1 each 2   Lancets (FREESTYLE) lancets Use 5 times daily to check blood sugar. 450 each 3   lipase/protease/amylase (CREON) 36000 UNITS CPEP capsule Take 1 capsule (36,000 Units total) by mouth 3 (three) times daily before meals. 180 capsule 3   lisinopril (ZESTRIL) 40 MG tablet TAKE 1 TABLET BY MOUTH DAILY. HOLD IF SYSTOLIC BLOOD PRESSURE IS LESS THAN 110 90 tablet 3   omeprazole  (PRILOSEC) 40 MG capsule TAKE 1 CAPSULE(40 MG) BY MOUTH DAILY 90 capsule 1   pioglitazone (ACTOS) 30 MG tablet Take 1 tablet (30 mg total) by mouth daily. 90 tablet 3   Semaglutide, 2 MG/DOSE, (OZEMPIC, 2 MG/DOSE,) 8 MG/3ML SOPN 2 mg by Other route once a week. 9 mL  3   injection device for insulin (CEQUR SIMPLICITY 2U) DEVI 1 Device by Other route once for 1 dose. 45 each 3   No current facility-administered medications for this visit.    REVIEW OF SYSTEMS:   [X]  denotes positive finding, [ ]  denotes negative finding Cardiac  Comments:  Chest pain or chest pressure:    Shortness of breath upon exertion:    Short of breath when lying flat:    Irregular heart rhythm:        Vascular    Pain in calf, thigh, or hip brought on by ambulation:    Pain in feet at night that wakes you up from your sleep:     Blood clot in your veins:    Leg swelling:         Pulmonary    Oxygen at home:    Productive cough:     Wheezing:         Neurologic    Sudden weakness in arms or legs:     Sudden numbness in arms or legs:     Sudden onset of difficulty speaking or slurred speech:    Temporary loss of vision in one eye:     Problems with dizziness:         Gastrointestinal    Blood in stool:     Vomited blood:         Genitourinary    Burning when urinating:     Blood in urine:        Psychiatric    Major depression:         Hematologic    Bleeding problems:    Problems with blood clotting too easily:        Skin    Rashes or ulcers:        Constitutional    Fever or chills:      PHYSICAL EXAM:   Vitals:   01/22/23 1206 01/22/23 1207  BP: (!) 150/74 135/75  Pulse: 60   Resp: 20   Temp: 97.8 F (36.6 C)   SpO2: 94%   Weight: 185 lb (83.9 kg)   Height: 6\' 1"  (1.854 m)     GENERAL: The patient is a well-nourished male, in no acute distress. The vital signs are documented above. CARDIAC: There is a regular rate and rhythm.  VASCULAR: Nonpalpable pedal pulses on the  left PULMONARY: Non-labored respirations MUSCULOSKELETAL: Right AKA NEUROLOGIC: No focal weakness or paresthesias are detected. SKIN: There are no ulcers or rashes noted. PSYCHIATRIC: The patient has a normal affect.  STUDIES:   I have reviewed the following:  ABI/TBIToday's ABIToday's TBIPrevious ABIPrevious TBI  +-------+-----------+-----------+------------+------------+  Right BKA                                             +-------+-----------+-----------+------------+------------+  Left  0.76       0.38       0.68        0.39          +-------+-----------+-----------+------------+------------+  Left: 50-74% stenosis noted in the superficial femoral artery.    Carotid:  MEDICAL ISSUES:   PAD: Symptomology remains stable.  He does not have open wounds.  Velocity profile in the superficial femoral artery remains in the 50-74% range with velocities less than 300 cm/s.  No intervention is recommended at this time.  Carotid:  Apparently he has had a increase in velocities on the left side, now causing a greater than 80% stenosis.  Because of his bilateral disease, I have recommended getting a CT angiogram to better define his anatomy and to determine his treatment options as well as to confirm the degree of stenosis.  I will get this within the next 2 to 3 weeks and have him follow-up afterwards.    Charlena Cross, MD, FACS [facility] [phone] Pager [phone]

## 2023-01-23 ENCOUNTER — Other Ambulatory Visit: Payer: Self-pay

## 2023-01-23 DIAGNOSIS — I6523 Occlusion and stenosis of bilateral carotid arteries: Secondary | ICD-10-CM

## 2023-01-29 ENCOUNTER — Other Ambulatory Visit: Payer: Self-pay | Admitting: Internal Medicine

## 2023-01-29 DIAGNOSIS — I739 Peripheral vascular disease, unspecified: Secondary | ICD-10-CM

## 2023-01-31 ENCOUNTER — Other Ambulatory Visit: Payer: Self-pay

## 2023-01-31 DIAGNOSIS — Z794 Long term (current) use of insulin: Secondary | ICD-10-CM

## 2023-01-31 MED ORDER — CEQUR SIMPLICITY 2U DEVI: 3 refills | Status: DC

## 2023-02-01 ENCOUNTER — Telehealth: Payer: Self-pay | Admitting: Pharmacy Technician

## 2023-02-01 ENCOUNTER — Other Ambulatory Visit (HOSPITAL_COMMUNITY): Payer: Self-pay

## 2023-02-01 NOTE — Telephone Encounter (Signed)
Pharmacy Patient Advocate Encounter   Received notification from Patient Pharmacy/[facility] that prior authorization for Cequr is required/requested.   Insurance verification completed.   The patient is insured through [employer] .   Per test claim: APPROVED from 01/02/23 to 01/31/24. Ran test claim, Copay is $0. This test claim was processed through [facility]- copay amounts may vary at other pharmacies due to pharmacy/plan contracts, or as the patient moves through the different stages of their insurance plan.  CaseId:[id number]  Pt filled the starter kit with 10 devices & picked up today. [facility] says it gives them a too soon rejection for the other devices & says it can't be filled until 11/23. They will try filling it when this supply runs low.

## 2023-02-02 ENCOUNTER — Ambulatory Visit (HOSPITAL_COMMUNITY)
Admission: RE | Admit: 2023-02-02 | Discharge: 2023-02-02 | Disposition: A | Discharge status: Home / Self Care | Payer: Medicare (Managed Care) | Source: Ambulatory Visit | Attending: Surgery | Admitting: Surgery

## 2023-02-02 DIAGNOSIS — I6501 Occlusion and stenosis of right vertebral artery: Secondary | ICD-10-CM | POA: Diagnosis not present

## 2023-02-02 DIAGNOSIS — I708 Atherosclerosis of other arteries: Secondary | ICD-10-CM | POA: Diagnosis not present

## 2023-02-02 DIAGNOSIS — I6523 Occlusion and stenosis of bilateral carotid arteries: Secondary | ICD-10-CM | POA: Diagnosis not present

## 2023-02-02 DIAGNOSIS — I672 Cerebral atherosclerosis: Secondary | ICD-10-CM | POA: Diagnosis not present

## 2023-02-02 LAB — POCT I-STAT CREATININE: Creatinine, Ser: 1.1 mg/dL (ref 0.61–1.24)

## 2023-02-02 MED ORDER — IOHEXOL 350 MG/ML SOLN
75.0000 mL | Freq: Once | INTRAVENOUS | Status: AC | PRN
  Administered 2023-02-02: 75 mL via INTRAVENOUS

## 2023-02-12 ENCOUNTER — Telehealth: Payer: Self-pay

## 2023-02-12 ENCOUNTER — Ambulatory Visit (INDEPENDENT_AMBULATORY_CARE_PROVIDER_SITE_OTHER): Payer: Medicare (Managed Care) | Admitting: Surgery

## 2023-02-12 ENCOUNTER — Encounter: Payer: Self-pay | Admitting: Surgery

## 2023-02-12 ENCOUNTER — Other Ambulatory Visit: Payer: Self-pay

## 2023-02-12 VITALS — BP 129/68 | HR 67 | Temp 98.2°F | Resp 20 | Ht 73.0 in | Wt 187.0 lb

## 2023-02-12 DIAGNOSIS — I6523 Occlusion and stenosis of bilateral carotid arteries: Secondary | ICD-10-CM | POA: Diagnosis not present

## 2023-02-12 NOTE — Telephone Encounter (Signed)
Pt given pre op instructions. All questions answered.

## 2023-02-12 NOTE — Progress Notes (Signed)
Vascular and Vein Specialist of [facility]  Patient name: Nathan [name] MRN: [id number] DOB: 1950/09/12 Sex: male   REASON FOR VISIT:    Follow up  HISOTRY OF PRESENT ILLNESS:   Nathan [name] is a 72 y.o. male who is back today for follow-up.  He initially presented with severe right leg pain in 2008.  He underwent a right femoral to below-knee popliteal artery bypass graft with saphenous vein on 02/26/2007.  This occluded and on 11/12/2007 he had a redo bypass with Gore-Tex.  He ultimately ended up with a right above-knee amputation.  In 2010 he began having left leg claudication and underwent popliteal artery stenting.  This was repeated on 08/17/2017 in the OR via a antegrade approach because of her high bifurcation.    His symptoms remain stable.  He denies any neurologic issues such as numbness or weakness in either extremity, slurred speech, or amaurosis fugax.  He continues to work on diabetes management.  He continues to take a statin.  His most recent carotid Doppler study showed greater than 80% left-sided stenosis with 60-79% right-sided disease.  Because of the bilateral disease I sent him for CT scan to define his anatomy.  PAST MEDICAL HISTORY:   Past Medical History:  Diagnosis Date   Asthma    per 2003 [facility] pulm records pfts    Blood dyscrasia    HIV   Clotting disorder (HCC)    Colon polyps    noted previous colonoscopy [facility]   DDD (degenerative disc disease)    cervical spine   Depression    Diabetes mellitus    dx 2010   Diarrhea 02/24/2021   Dizziness 08/25/2021   Falls 08/25/2021   Fever    unknwon origin   GERD (gastroesophageal reflux disease)    Head swelling 05/23/2017   Hep C w/o coma, chronic (HCC)    History of syphilis    noted [facility] records   HIV infection (HCC)    undetectable viral load and CD4 ct 667 as of 11/2011   Hyperlipidemia    Hypertension    MRSA (methicillin resistant Staphylococcus aureus)     Multiple sclerosis (HCC)    in remission as of 11/2011 (diagnosed late 1980s)   Pain in limb-Right Leg 10/13/2013   Pancreas (digestive gland) works poorly    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HCC)    2002   Pneumonia    Prosthesis fitting 03/10/2019   PVD (peripheral vascular disease) (HCC)    Left Stent 07/02/2008   Scalp lesion 07/26/2017   UTI (urinary tract infection)    Wears dentures      FAMILY HISTORY:   Family History  Problem Relation Age of Onset   Diabetes Mother    Coronary artery disease Mother    Coronary artery disease Father    Liver disease Sister    Cancer Brother        colon caner stage 4 as of 11/2011 (unknown age of onset)   Prostate cancer Brother    Colon cancer Brother    Rectal cancer Neg Hx    Esophageal cancer Neg Hx     SOCIAL HISTORY:   Social History   Tobacco Use   Smoking status: Former    Passive exposure: Never   Smokeless tobacco: Former    Types: Chew  Substance Use Topics   Alcohol use: No    Alcohol/week: 0.0 standard drinks of alcohol     ALLERGIES:   Allergies  Allergen  Reactions   Sulfonamide Derivatives Hives     CURRENT MEDICATIONS:   Current Outpatient Medications  Medication Sig Dispense Refill   atorvastatin (LIPITOR) 40 MG tablet TAKE 1 TABLET(40 MG) BY MOUTH DAILY 90 tablet 1   bictegravir-emtricitabine-tenofovir AF (BIKTARVY) 50-200-25 MG TABS tablet Take 1 tablet by mouth daily. 30 tablet 11   Blood Glucose Monitoring Suppl (FREESTYLE LITE) w/Device KIT 1 Device by Does not apply route daily in the afternoon. 1 kit 0   cilostazol (PLETAL) 50 MG tablet TAKE 1 TABLET(50 MG) BY MOUTH TWICE DAILY 60 tablet 3   clopidogrel (PLAVIX) 75 MG tablet Take 1 tablet (75 mg total) by mouth daily. 90 tablet 3   Continuous Glucose Receiver (FREESTYLE LIBRE 3 READER) DEVI 1 each by Does not apply route as directed. Use  with freestyle libre 3 sensors to monitor blood sugar continuously 1 each 0   Continuous  Glucose Sensor (FREESTYLE LIBRE 3 SENSOR) MISC Place 1 sensor on the skin every 14 days. Use to check glucose continuously 6 each 3   diclofenac Sodium (VOLTAREN) 1 % GEL Apply 4 g topically 4 (four) times daily as needed. 100 g 1   empagliflozin (JARDIANCE) 25 MG TABS tablet Take 1 tablet (25 mg total) by mouth daily before breakfast. 90 tablet 3   ezetimibe (ZETIA) 10 MG tablet Take 1 tablet (10 mg total) by mouth daily. 90 tablet 2   glucose blood (CONTOUR NEXT TEST) test strip USE TO CHECK BLOOD SUGAR FIVE TIMES DAILY 400 strip 3   glucose blood (FREESTYLE LITE) test strip Use 5 times daily to check blood sugar. 450 each 3   glucose blood (FREESTYLE LITE) test strip 1 each by Other route 3 (three) times daily. Use as instructed 300 each 3   glucose blood (FREESTYLE LITE) test strip Use as instructed 100 each 12   HUMALOG 100 UNIT/ML injection Max daily 110 units per CeQur 110 mL 3   ibuprofen (ADVIL) 400 MG tablet Take by mouth.     injection device for insulin (CEQUR SIMPLICITY 2U) DEVI change every 2 days 45 each 3   Injection Device for Insulin (CEQUR SIMPLICITY INSERTER) MISC Use to Apply a new cecur simplicity patch as directed every 2 days 1 each 1   insulin glargine, 2 Unit Dial, (TOUJEO MAX SOLOSTAR) 300 UNIT/ML Solostar Pen Inject 60 Units into the skin in the morning. 18 mL 3   Insulin Pen Needle (BD PEN NEEDLE NANO U/F) 32G X 4 MM MISC 1 Device by Other route daily in the afternoon. USE AS DIRECTED FOUR TIMES DAILY. Dx code  E11.42 100 each 3   Insulin Syringe-Needle U-100 (GNP INSULIN SYRINGES) 30G X 5/16" 1 ML MISC 1 Device by Does not apply route daily in the afternoon. 100 each 3   Lancet Devices (BAYER MICROLET 2 LANCING DEVIC) MISC Use to check blood sugar up to 3 ties a day 1 each 2   Lancets (FREESTYLE) lancets Use 5 times daily to check blood sugar. 450 each 3   lipase/protease/amylase (CREON) 36000 UNITS CPEP capsule Take 1 capsule (36,000 Units total) by mouth 3 (three)  times daily before meals. 180 capsule 3   lisinopril (ZESTRIL) 40 MG tablet TAKE 1 TABLET BY MOUTH DAILY. HOLD IF SYSTOLIC BLOOD PRESSURE IS LESS THAN 110 90 tablet 3   omeprazole (PRILOSEC) 40 MG capsule TAKE 1 CAPSULE(40 MG) BY MOUTH DAILY 90 capsule 1   pioglitazone (ACTOS) 30 MG tablet Take 1 tablet (30 mg total)  by mouth daily. 90 tablet 3   Semaglutide, 2 MG/DOSE, (OZEMPIC, 2 MG/DOSE,) 8 MG/3ML SOPN 2 mg by Other route once a week. 9 mL 3   injection device for insulin (CEQUR SIMPLICITY 2U) DEVI 1 Device by Other route once for 1 dose. 45 each 3   No current facility-administered medications for this visit.    REVIEW OF SYSTEMS:   [X]  denotes positive finding, [ ]  denotes negative finding Cardiac  Comments:  Chest pain or chest pressure:    Shortness of breath upon exertion:    Short of breath when lying flat:    Irregular heart rhythm:        Vascular    Pain in calf, thigh, or hip brought on by ambulation:    Pain in feet at night that wakes you up from your sleep:     Blood clot in your veins:    Leg swelling:         Pulmonary    Oxygen at home:    Productive cough:     Wheezing:         Neurologic    Sudden weakness in arms or legs:     Sudden numbness in arms or legs:     Sudden onset of difficulty speaking or slurred speech:    Temporary loss of vision in one eye:     Problems with dizziness:         Gastrointestinal    Blood in stool:     Vomited blood:         Genitourinary    Burning when urinating:     Blood in urine:        Psychiatric    Major depression:         Hematologic    Bleeding problems:    Problems with blood clotting too easily:        Skin    Rashes or ulcers:        Constitutional    Fever or chills:      PHYSICAL EXAM:   Vitals:   02/12/23 0955 02/12/23 0957  BP: (!) 141/70 129/68  Pulse: 67   Resp: 20   Temp: 98.2 F (36.8 C)   SpO2: 92%   Weight: 187 lb (84.8 kg)   Height: 6\' 1"  (1.854 m)     GENERAL: The  patient is a well-nourished male, in no acute distress. The vital signs are documented above. CARDIAC: There is a regular rate and rhythm.  PULMONARY: Non-labored respirations ABDOMEN: Soft and non-tender with normal pitched bowel sounds.  MUSCULOSKELETAL: There are no major deformities or cyanosis. NEUROLOGIC: No focal weakness or paresthesias are detected. SKIN: There are no ulcers or rashes noted. PSYCHIATRIC: The patient has a normal affect.  STUDIES:   I have reviewed his CT scan with the following findings: 1. Bilateral proximal internal carotid artery stenosis secondary to predominantly calcific atherosclerosis, measuring 75% on the right and 80% on left. 2. Multifocal calcific atherosclerosis of the vertebral arteries causing severe stenosis of the right V1 and V4 segments. 3. A 75% stenosis of the proximal right subclavian artery.  MEDICAL ISSUES:   Bilateral asymptomatic carotid stenosis, left greater than right: CT scan shows that the stenosis on the left is greater than 80%.  He is asymptomatic.  We discussed treatment options including medical management, endarterectomy, and stenting.  Because of the calcific disease of his lesion, I think the best option is to proceed with a left carotid  endarterectomy.  I discussed the risk of nerve injury and stroke.  We talked about the recovery time.  All of his questions were answered.  He wants to get this done in the near future.  He is not on aspirin.  I will continue his Plavix.  I will make sure he is off of his Pletal.    Durene Cal, IV, MD, FACS [facility] and Vein Specialists of [facility] [phone] Pager [phone]

## 2023-02-12 NOTE — H&P (View-Only) (Signed)
Vascular and Vein Specialist of [facility]  Patient name: Nathan [name] MRN: [id number] DOB: 08-06-1950 Sex: male   REASON FOR VISIT:    Follow up  HISOTRY OF PRESENT ILLNESS:   Nathan [name] is a 72 y.o. male who is back today for follow-up.  He initially presented with severe right leg pain in 2008.  He underwent a right femoral to below-knee popliteal artery bypass graft with saphenous vein on 02/26/2007.  This occluded and on 11/12/2007 he had a redo bypass with Gore-Tex.  He ultimately ended up with a right above-knee amputation.  In 2010 he began having left leg claudication and underwent popliteal artery stenting.  This was repeated on 08/17/2017 in the OR via a antegrade approach because of her high bifurcation.    His symptoms remain stable.  He denies any neurologic issues such as numbness or weakness in either extremity, slurred speech, or amaurosis fugax.  He continues to work on diabetes management.  He continues to take a statin.  His most recent carotid Doppler study showed greater than 80% left-sided stenosis with 60-79% right-sided disease.  Because of the bilateral disease I sent him for CT scan to define his anatomy.  PAST MEDICAL HISTORY:   Past Medical History:  Diagnosis Date   Asthma    per 2003 [facility] pulm records pfts    Blood dyscrasia    HIV   Clotting disorder (HCC)    Colon polyps    noted previous colonoscopy [facility]   DDD (degenerative disc disease)    cervical spine   Depression    Diabetes mellitus    dx 2010   Diarrhea 02/24/2021   Dizziness 08/25/2021   Falls 08/25/2021   Fever    unknwon origin   GERD (gastroesophageal reflux disease)    Head swelling 05/23/2017   Hep C w/o coma, chronic (HCC)    History of syphilis    noted [facility] records   HIV infection (HCC)    undetectable viral load and CD4 ct 667 as of 11/2011   Hyperlipidemia    Hypertension    MRSA (methicillin resistant Staphylococcus aureus)     Multiple sclerosis (HCC)    in remission as of 11/2011 (diagnosed late 1980s)   Pain in limb-Right Leg 10/13/2013   Pancreas (digestive gland) works poorly    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HCC)    2002   Pneumonia    Prosthesis fitting 03/10/2019   PVD (peripheral vascular disease) (HCC)    Left Stent 07/02/2008   Scalp lesion 07/26/2017   UTI (urinary tract infection)    Wears dentures      FAMILY HISTORY:   Family History  Problem Relation Age of Onset   Diabetes Mother    Coronary artery disease Mother    Coronary artery disease Father    Liver disease Sister    Cancer Brother        colon caner stage 4 as of 11/2011 (unknown age of onset)   Prostate cancer Brother    Colon cancer Brother    Rectal cancer Neg Hx    Esophageal cancer Neg Hx     SOCIAL HISTORY:   Social History   Tobacco Use   Smoking status: Former    Passive exposure: Never   Smokeless tobacco: Former    Types: Chew  Substance Use Topics   Alcohol use: No    Alcohol/week: 0.0 standard drinks of alcohol     ALLERGIES:   Allergies  Allergen  Reactions   Sulfonamide Derivatives Hives     CURRENT MEDICATIONS:   Current Outpatient Medications  Medication Sig Dispense Refill   atorvastatin (LIPITOR) 40 MG tablet TAKE 1 TABLET(40 MG) BY MOUTH DAILY 90 tablet 1   bictegravir-emtricitabine-tenofovir AF (BIKTARVY) 50-200-25 MG TABS tablet Take 1 tablet by mouth daily. 30 tablet 11   Blood Glucose Monitoring Suppl (FREESTYLE LITE) w/Device KIT 1 Device by Does not apply route daily in the afternoon. 1 kit 0   cilostazol (PLETAL) 50 MG tablet TAKE 1 TABLET(50 MG) BY MOUTH TWICE DAILY 60 tablet 3   clopidogrel (PLAVIX) 75 MG tablet Take 1 tablet (75 mg total) by mouth daily. 90 tablet 3   Continuous Glucose Receiver (FREESTYLE LIBRE 3 READER) DEVI 1 each by Does not apply route as directed. Use  with freestyle libre 3 sensors to monitor blood sugar continuously 1 each 0   Continuous  Glucose Sensor (FREESTYLE LIBRE 3 SENSOR) MISC Place 1 sensor on the skin every 14 days. Use to check glucose continuously 6 each 3   diclofenac Sodium (VOLTAREN) 1 % GEL Apply 4 g topically 4 (four) times daily as needed. 100 g 1   empagliflozin (JARDIANCE) 25 MG TABS tablet Take 1 tablet (25 mg total) by mouth daily before breakfast. 90 tablet 3   ezetimibe (ZETIA) 10 MG tablet Take 1 tablet (10 mg total) by mouth daily. 90 tablet 2   glucose blood (CONTOUR NEXT TEST) test strip USE TO CHECK BLOOD SUGAR FIVE TIMES DAILY 400 strip 3   glucose blood (FREESTYLE LITE) test strip Use 5 times daily to check blood sugar. 450 each 3   glucose blood (FREESTYLE LITE) test strip 1 each by Other route 3 (three) times daily. Use as instructed 300 each 3   glucose blood (FREESTYLE LITE) test strip Use as instructed 100 each 12   HUMALOG 100 UNIT/ML injection Max daily 110 units per CeQur 110 mL 3   ibuprofen (ADVIL) 400 MG tablet Take by mouth.     injection device for insulin (CEQUR SIMPLICITY 2U) DEVI change every 2 days 45 each 3   Injection Device for Insulin (CEQUR SIMPLICITY INSERTER) MISC Use to Apply a new cecur simplicity patch as directed every 2 days 1 each 1   insulin glargine, 2 Unit Dial, (TOUJEO MAX SOLOSTAR) 300 UNIT/ML Solostar Pen Inject 60 Units into the skin in the morning. 18 mL 3   Insulin Pen Needle (BD PEN NEEDLE NANO U/F) 32G X 4 MM MISC 1 Device by Other route daily in the afternoon. USE AS DIRECTED FOUR TIMES DAILY. Dx code  E11.42 100 each 3   Insulin Syringe-Needle U-100 (GNP INSULIN SYRINGES) 30G X 5/16" 1 ML MISC 1 Device by Does not apply route daily in the afternoon. 100 each 3   Lancet Devices (BAYER MICROLET 2 LANCING DEVIC) MISC Use to check blood sugar up to 3 ties a day 1 each 2   Lancets (FREESTYLE) lancets Use 5 times daily to check blood sugar. 450 each 3   lipase/protease/amylase (CREON) 36000 UNITS CPEP capsule Take 1 capsule (36,000 Units total) by mouth 3 (three)  times daily before meals. 180 capsule 3   lisinopril (ZESTRIL) 40 MG tablet TAKE 1 TABLET BY MOUTH DAILY. HOLD IF SYSTOLIC BLOOD PRESSURE IS LESS THAN 110 90 tablet 3   omeprazole (PRILOSEC) 40 MG capsule TAKE 1 CAPSULE(40 MG) BY MOUTH DAILY 90 capsule 1   pioglitazone (ACTOS) 30 MG tablet Take 1 tablet (30 mg total)  by mouth daily. 90 tablet 3   Semaglutide, 2 MG/DOSE, (OZEMPIC, 2 MG/DOSE,) 8 MG/3ML SOPN 2 mg by Other route once a week. 9 mL 3   injection device for insulin (CEQUR SIMPLICITY 2U) DEVI 1 Device by Other route once for 1 dose. 45 each 3   No current facility-administered medications for this visit.    REVIEW OF SYSTEMS:   [X]  denotes positive finding, [ ]  denotes negative finding Cardiac  Comments:  Chest pain or chest pressure:    Shortness of breath upon exertion:    Short of breath when lying flat:    Irregular heart rhythm:        Vascular    Pain in calf, thigh, or hip brought on by ambulation:    Pain in feet at night that wakes you up from your sleep:     Blood clot in your veins:    Leg swelling:         Pulmonary    Oxygen at home:    Productive cough:     Wheezing:         Neurologic    Sudden weakness in arms or legs:     Sudden numbness in arms or legs:     Sudden onset of difficulty speaking or slurred speech:    Temporary loss of vision in one eye:     Problems with dizziness:         Gastrointestinal    Blood in stool:     Vomited blood:         Genitourinary    Burning when urinating:     Blood in urine:        Psychiatric    Major depression:         Hematologic    Bleeding problems:    Problems with blood clotting too easily:        Skin    Rashes or ulcers:        Constitutional    Fever or chills:      PHYSICAL EXAM:   Vitals:   02/12/23 0955 02/12/23 0957  BP: (!) 141/70 129/68  Pulse: 67   Resp: 20   Temp: 98.2 F (36.8 C)   SpO2: 92%   Weight: 187 lb (84.8 kg)   Height: 6\' 1"  (1.854 m)     GENERAL: The  patient is a well-nourished male, in no acute distress. The vital signs are documented above. CARDIAC: There is a regular rate and rhythm.  PULMONARY: Non-labored respirations ABDOMEN: Soft and non-tender with normal pitched bowel sounds.  MUSCULOSKELETAL: There are no major deformities or cyanosis. NEUROLOGIC: No focal weakness or paresthesias are detected. SKIN: There are no ulcers or rashes noted. PSYCHIATRIC: The patient has a normal affect.  STUDIES:   I have reviewed his CT scan with the following findings: 1. Bilateral proximal internal carotid artery stenosis secondary to predominantly calcific atherosclerosis, measuring 75% on the right and 80% on left. 2. Multifocal calcific atherosclerosis of the vertebral arteries causing severe stenosis of the right V1 and V4 segments. 3. A 75% stenosis of the proximal right subclavian artery.  MEDICAL ISSUES:   Bilateral asymptomatic carotid stenosis, left greater than right: CT scan shows that the stenosis on the left is greater than 80%.  He is asymptomatic.  We discussed treatment options including medical management, endarterectomy, and stenting.  Because of the calcific disease of his lesion, I think the best option is to proceed with a left carotid  endarterectomy.  I discussed the risk of nerve injury and stroke.  We talked about the recovery time.  All of his questions were answered.  He wants to get this done in the near future.  He is not on aspirin.  I will continue his Plavix.  I will make sure he is off of his Pletal.    Durene Cal, IV, MD, FACS Vascular and Vein Specialists of [facility] [phone] Pager [phone]

## 2023-02-13 NOTE — Pre-Procedure Instructions (Signed)
Surgical Instructions   Your procedure is scheduled on Thursday, October 17th. Report to [facility] Main Entrance "A" at 05:30 A.M., then check in with the Admitting office. Any questions or running late day of surgery: call [phone]  Questions prior to your surgery date: call [phone], Monday-Friday, 8am-4pm. If you experience any cold or flu symptoms such as cough, fever, chills, shortness of breath, etc. between now and your scheduled surgery, please notify us at the above number.     Remember:  Do not eat or drink after midnight the night before your surgery    Take these medicines the morning of surgery with A SIP OF WATER  atorvastatin (LIPITOR)  bictegravir-emtricitabine-tenofovir AF (BIKTARVY)  clopidogrel (PLAVIX)  ezetimibe (ZETIA)  omeprazole (PRILOSEC)    May take these medicines IF NEEDED:  Follow your surgeon's instructions on when to stop cilostazol (PLETAL).  If no instructions were given by your surgeon then you will need to call the office to get those instructions.     One week prior to surgery, STOP taking any Aspirin (unless otherwise instructed by your surgeon) Aleve, Naproxen, Ibuprofen, Motrin, Advil, Goody's, BC's, all herbal medications, fish oil, and non-prescription vitamins.  WHAT DO I DO ABOUT MY DIABETES MEDICATION?   Do not take pioglitazone (ACTOS) the morning of surgery.  HOLD JARDIANCE 72 HOURS PRIOR TO SURGERY. Last dose will be 10/13.   HOLD  Semaglutide (OZEMPIC) 7 DAYS PRIOR TO SURGERY. Last dose on or before 10/9.   THE MORNING OF SURGERY, take 30 units (50%) of insulin glargine (TOUJEO MAX SOLOSTAR).  If your CBG is greater than 220 mg/dL, you may take  of your sliding scale (correction) dose of HUMALOG- per CeQur device.   HOW TO MANAGE YOUR DIABETES BEFORE AND AFTER SURGERY  Why is it important to control my blood sugar before and after surgery? Improving blood sugar levels before and after surgery helps healing and can  limit problems. A way of improving blood sugar control is eating a healthy diet by:  Eating less sugar and carbohydrates  Increasing activity/exercise  Talking with your doctor about reaching your blood sugar goals High blood sugars (greater than 180 mg/dL) can raise your risk of infections and slow your recovery, so you will need to focus on controlling your diabetes during the weeks before surgery. Make sure that the doctor who takes care of your diabetes knows about your planned surgery including the date and location.  How do I manage my blood sugar before surgery? Check your blood sugar at least 4 times a day, starting 2 days before surgery, to make sure that the level is not too high or low.  Check your blood sugar the morning of your surgery when you wake up and every 2 hours until you get to the Short Stay unit.  If your blood sugar is less than 70 mg/dL, you will need to treat for low blood sugar: Do not take insulin. Treat a low blood sugar (less than 70 mg/dL) with  cup of clear juice (cranberry or apple), 4 glucose tablets, OR glucose gel. Recheck blood sugar in 15 minutes after treatment (to make sure it is greater than 70 mg/dL). If your blood sugar is not greater than 70 mg/dL on recheck, call [phone] for further instructions. Report your blood sugar to the short stay nurse when you get to Short Stay.  If you are admitted to the hospital after surgery: Your blood sugar will be checked by the staff and you  will probably be given insulin after surgery (instead of oral diabetes medicines) to make sure you have good blood sugar levels. The goal for blood sugar control after surgery is 80-180 mg/dL.                     Do NOT Smoke (Tobacco/Vaping) for 24 hours prior to your procedure.  If you use a CPAP at night, you may bring your mask/headgear for your overnight stay.   You will be asked to remove any contacts, glasses, piercing's, hearing aid's, dentures/partials prior  to surgery. Please bring cases for these items if needed.    Patients discharged the day of surgery will not be allowed to drive home, and someone needs to stay with them for 24 hours.  SURGICAL WAITING ROOM VISITATION Patients may have no more than 2 support people in the waiting area - these visitors may rotate.   Pre-op nurse will coordinate an appropriate time for 1 ADULT support person, who may not rotate, to accompany patient in pre-op.  Children under the age of 61 must have an adult with them who is not the patient and must remain in the main waiting area with an adult.  If the patient needs to stay at the hospital during part of their recovery, the visitor guidelines for inpatient rooms apply.  Please refer to the [facility] website for the visitor guidelines for any additional information.   If you received a COVID test during your pre-op visit  it is requested that you wear a mask when out in public, stay away from anyone that may not be feeling well and notify your surgeon if you develop symptoms. If you have been in contact with anyone that has tested positive in the last 10 days please notify you surgeon.      Pre-operative CHG Bathing Instructions   You can play a key role in reducing the risk of infection after surgery. Your skin needs to be as free of germs as possible. You can reduce the number of germs on your skin by washing with CHG (chlorhexidine gluconate) soap before surgery. CHG is an antiseptic soap that kills germs and continues to kill germs even after washing.   DO NOT use if you have an allergy to chlorhexidine/CHG or antibacterial soaps. If your skin becomes reddened or irritated, stop using the CHG and notify one of our RNs at [phone].              TAKE A SHOWER THE NIGHT BEFORE SURGERY AND THE DAY OF SURGERY    Please keep in mind the following:  DO NOT shave, including legs and underarms, 48 hours prior to surgery.   You may shave your face  before/day of surgery.  Place clean sheets on your bed the night before surgery Use a clean washcloth (not used since being washed) for each shower. DO NOT sleep with pet's night before surgery.  CHG Shower Instructions:  Wash your face and private area with normal soap. If you choose to wash your hair, wash first with your normal shampoo.  After you use shampoo/soap, rinse your hair and body thoroughly to remove shampoo/soap residue.  Turn the water OFF and apply half the bottle of CHG soap to a CLEAN washcloth.  Apply CHG soap ONLY FROM YOUR NECK DOWN TO YOUR TOES (washing for 3-5 minutes)  DO NOT use CHG soap on face, private areas, open wounds, or sores.  Pay special attention to the area  where your surgery is being performed.  If you are having back surgery, having someone wash your back for you may be helpful. Wait 2 minutes after CHG soap is applied, then you may rinse off the CHG soap.  Pat dry with a clean towel  Put on clean pajamas    Additional instructions for the day of surgery: DO NOT APPLY any lotions, deodorants, cologne, or perfumes.   Do not wear jewelry or makeup Do not wear nail polish, gel polish, artificial nails, or any other type of covering on natural nails (fingers and toes) Do not bring valuables to the hospital. [facility] is not responsible for valuables/personal belongings. Put on clean/comfortable clothes.  Please brush your teeth.  Ask your nurse before applying any prescription medications to the skin.

## 2023-02-14 ENCOUNTER — Encounter (HOSPITAL_COMMUNITY): Payer: Self-pay

## 2023-02-14 ENCOUNTER — Encounter (HOSPITAL_COMMUNITY)
Admission: RE | Admit: 2023-02-14 | Discharge: 2023-02-14 | Disposition: A | Discharge status: Home / Self Care | Payer: Medicare (Managed Care) | Source: Ambulatory Visit | Attending: Surgery | Admitting: Surgery

## 2023-02-14 ENCOUNTER — Other Ambulatory Visit: Payer: Self-pay

## 2023-02-14 VITALS — BP 147/68 | HR 70 | Temp 98.0°F | Resp 18 | Ht 73.0 in | Wt 188.1 lb

## 2023-02-14 DIAGNOSIS — I6523 Occlusion and stenosis of bilateral carotid arteries: Secondary | ICD-10-CM | POA: Diagnosis not present

## 2023-02-14 DIAGNOSIS — Z794 Long term (current) use of insulin: Secondary | ICD-10-CM | POA: Diagnosis not present

## 2023-02-14 DIAGNOSIS — E119 Type 2 diabetes mellitus without complications: Secondary | ICD-10-CM | POA: Diagnosis not present

## 2023-02-14 DIAGNOSIS — Z01818 Encounter for other preprocedural examination: Secondary | ICD-10-CM

## 2023-02-14 LAB — COMPREHENSIVE METABOLIC PANEL
ALT: 23 U/L (ref 0–44)
AST: 25 U/L (ref 15–41)
Albumin: 4.3 g/dL (ref 3.5–5.0)
Alkaline Phosphatase: 50 U/L (ref 38–126)
Anion gap: 14 (ref 5–15)
BUN: 14 mg/dL (ref 8–23)
CO2: 25 mmol/L (ref 22–32)
Calcium: 10.2 mg/dL (ref 8.9–10.3)
Chloride: 101 mmol/L (ref 98–111)
Creatinine, Ser: 1.15 mg/dL (ref 0.61–1.24)
GFR, Estimated: >60 mL/min (ref >60)
Glucose, Bld: 248 mg/dL — ABNORMAL HIGH (ref 70–99)
Potassium: 4.1 mmol/L (ref 3.5–5.1)
Sodium: 140 mmol/L (ref 135–145)
Total Bilirubin: 0.6 mg/dL (ref 0.3–1.2)
Total Protein: 7.5 g/dL (ref 6.5–8.1)

## 2023-02-14 LAB — URINALYSIS, ROUTINE W REFLEX MICROSCOPIC
Bacteria, UA: NONE SEEN
Bilirubin Urine: NEGATIVE
Glucose, UA: >=500 mg/dL — AB
Hgb urine dipstick: NEGATIVE
Ketones, ur: NEGATIVE mg/dL
Leukocytes,Ua: NEGATIVE
Nitrite: NEGATIVE
Protein, ur: NEGATIVE mg/dL
Specific Gravity, Urine: 1.017 (ref 1.005–1.030)
pH: 5 (ref 5.0–8.0)

## 2023-02-14 LAB — PROTIME-INR
INR: 1 (ref 0.8–1.2)
Prothrombin Time: 13 s (ref 11.4–15.2)

## 2023-02-14 LAB — TYPE AND SCREEN
ABO/RH(D): AB POS
Antibody Screen: NEGATIVE

## 2023-02-14 LAB — CBC
HCT: 47.8 % (ref 39.0–52.0)
Hemoglobin: 15.8 g/dL (ref 13.0–17.0)
MCH: 31.7 pg (ref 26.0–34.0)
MCHC: 33.1 g/dL (ref 30.0–36.0)
MCV: 96 fL (ref 80.0–100.0)
Platelets: 195 10*3/uL (ref 150–400)
RBC: 4.98 MIL/uL (ref 4.22–5.81)
RDW: 13.2 % (ref 11.5–15.5)
WBC: 6.9 10*3/uL (ref 4.0–10.5)
nRBC: 0 % (ref 0.0–0.2)

## 2023-02-14 LAB — SURGICAL PCR SCREEN
MRSA, PCR: NEGATIVE
Staphylococcus aureus: NEGATIVE

## 2023-02-14 LAB — APTT: aPTT: 26 s (ref 24–36)

## 2023-02-14 LAB — GLUCOSE, CAPILLARY: Glucose-Capillary: 265 mg/dL — ABNORMAL HIGH (ref 70–99)

## 2023-02-14 NOTE — Progress Notes (Signed)
   02/14/23 0922  OBSTRUCTIVE SLEEP APNEA  Have you ever been diagnosed with sleep apnea through a sleep study? No  Do you snore loudly (loud enough to be heard through closed doors)?  1  Do you often feel tired, fatigued, or sleepy during the daytime (such as falling asleep during driving or talking to someone)? 0  Has anyone observed you stop breathing during your sleep? 0  Do you have, or are you being treated for high blood pressure? 1  BMI more than 35 kg/m2? 0  Age > 50 (1-yes) 1  Neck circumference greater than:Male 16 inches or larger, Male 17inches or larger? 1  Male Gender (Yes=1) 1  Obstructive Sleep Apnea Score 5  Score 5 or greater  Results sent to PCP

## 2023-02-14 NOTE — Progress Notes (Signed)
PCP - Dr. Jason Fila Nooruddin Cardiologist - denies Endocrinologist- Dr. Terrace Arabia  PPM/ICD - denies   Chest x-ray - 06/21/21 EKG - 02/14/23 Stress Test - denies ECHO - 01/02/19 Cardiac Cath - denies  Sleep Study - denies   Fasting Blood Sugar - 150-200 Checks Blood Sugar 3-4 times a day  Last dose of GLP1 agonist-  02/13/23 GLP1 instructions: Hold 7 days prior to surgery. Pt knows not to take any more doses prior to surgery  Blood Thinner Instructions: Hold Pletal 5 days prior (pt  states that is what he was instructed) Last dose 10/11. Continue Plavix Aspirin Instructions: n/a  ERAS Protcol - no, NPO   COVID TEST- n/a   Anesthesia review: yes, pt's CBG 265 today. Pt states he has taken all of his diabetic medications today. He last ate at 0745. Last A1C was 8.2 on 01/01/23. Pt instructed to not wear his CeQur device on day of surgery, per DM coordinator.  Patient denies shortness of breath, fever, cough and chest pain at PAT appointment   All instructions explained to the patient, with a verbal understanding of the material. Patient agrees to go over the instructions while at home for a better understanding.  The opportunity to ask questions was provided.

## 2023-02-16 ENCOUNTER — Telehealth: Payer: Self-pay | Admitting: Internal Medicine

## 2023-02-16 NOTE — Telephone Encounter (Signed)
This patient has screened positive for food insecurity in the past.   On 01/29/23, patient was called to re-assess food insecurity needs.   Answers were updated in the SDOH wheel.  Patient no longer screening + for food insecurity.   Debe Coder, MD

## 2023-02-21 NOTE — Anesthesia Preprocedure Evaluation (Signed)
Anesthesia Evaluation  Patient identified by MRN, date of birth, ID band Patient awake    Reviewed: Allergy & Precautions, NPO status , Patient's Chart, lab work & pertinent test results  History of Anesthesia Complications Negative for: history of anesthetic complications  Airway Mallampati: II  TM Distance: >3 FB Neck ROM: Full    Dental  (+) Edentulous Upper, Edentulous Lower   Pulmonary former smoker   Pulmonary exam normal        Cardiovascular hypertension, Pt. on medications + Peripheral Vascular Disease  Normal cardiovascular exam     Neuro/Psych    Depression    MS    GI/Hepatic ,GERD  Medicated,,(+) Hepatitis -, C  Endo/Other  diabetes (on Ozempic), Type 2, Insulin Dependent, Oral Hypoglycemic Agents    Renal/GU negative Renal ROS     Musculoskeletal  (+) Arthritis ,    Abdominal   Peds  Hematology  (+) HIV  Anesthesia Other Findings Day of surgery medications reviewed with patient.  Reproductive/Obstetrics                              Anesthesia Physical Anesthesia Plan  ASA: 3  Anesthesia Plan: General   Post-op Pain Management: Tylenol PO (pre-op)*   Induction: Intravenous  PONV Risk Score and Plan: 2 and Dexamethasone, Ondansetron and Treatment may vary due to age or medical condition  Airway Management Planned: Oral ETT  Additional Equipment: Arterial line  Intra-op Plan:   Post-operative Plan: Extubation in OR  Informed Consent: I have reviewed the patients History and Physical, chart, labs and discussed the procedure including the risks, benefits and alternatives for the proposed anesthesia with the patient or authorized representative who has indicated his/her understanding and acceptance.     Dental advisory given  Plan Discussed with: CRNA  Anesthesia Plan Comments:          Anesthesia Quick Evaluation

## 2023-02-22 ENCOUNTER — Inpatient Hospital Stay (HOSPITAL_COMMUNITY): Payer: Medicare (Managed Care) | Admitting: Physician Assistant

## 2023-02-22 ENCOUNTER — Encounter (HOSPITAL_COMMUNITY)
Admission: RE | Disposition: A | Discharge status: Home / Self Care | Payer: Self-pay | Source: Home / Self Care | Attending: Surgery

## 2023-02-22 ENCOUNTER — Inpatient Hospital Stay (HOSPITAL_COMMUNITY): Payer: Medicare (Managed Care) | Admitting: Certified Registered Nurse Anesthetist

## 2023-02-22 ENCOUNTER — Other Ambulatory Visit: Payer: Self-pay

## 2023-02-22 ENCOUNTER — Inpatient Hospital Stay (HOSPITAL_COMMUNITY)
Admission: RE | Admit: 2023-02-22 | Discharge: 2023-02-23 | DRG: 039 | Disposition: A | Discharge status: Home / Self Care | Payer: Medicare (Managed Care) | Attending: Surgery | Admitting: Surgery

## 2023-02-22 ENCOUNTER — Inpatient Hospital Stay (HOSPITAL_COMMUNITY): Payer: Medicare (Managed Care)

## 2023-02-22 DIAGNOSIS — Z21 Asymptomatic human immunodeficiency virus [HIV] infection status: Secondary | ICD-10-CM | POA: Diagnosis present

## 2023-02-22 DIAGNOSIS — Z7985 Long-term (current) use of injectable non-insulin antidiabetic drugs: Secondary | ICD-10-CM | POA: Diagnosis not present

## 2023-02-22 DIAGNOSIS — Z7984 Long term (current) use of oral hypoglycemic drugs: Secondary | ICD-10-CM

## 2023-02-22 DIAGNOSIS — E785 Hyperlipidemia, unspecified: Secondary | ICD-10-CM | POA: Diagnosis present

## 2023-02-22 DIAGNOSIS — Z79899 Other long term (current) drug therapy: Secondary | ICD-10-CM | POA: Diagnosis not present

## 2023-02-22 DIAGNOSIS — Z8042 Family history of malignant neoplasm of prostate: Secondary | ICD-10-CM

## 2023-02-22 DIAGNOSIS — E1151 Type 2 diabetes mellitus with diabetic peripheral angiopathy without gangrene: Secondary | ICD-10-CM | POA: Diagnosis present

## 2023-02-22 DIAGNOSIS — Z8249 Family history of ischemic heart disease and other diseases of the circulatory system: Secondary | ICD-10-CM | POA: Diagnosis not present

## 2023-02-22 DIAGNOSIS — G35 Multiple sclerosis: Secondary | ICD-10-CM | POA: Diagnosis present

## 2023-02-22 DIAGNOSIS — K219 Gastro-esophageal reflux disease without esophagitis: Secondary | ICD-10-CM | POA: Diagnosis not present

## 2023-02-22 DIAGNOSIS — I10 Essential (primary) hypertension: Secondary | ICD-10-CM | POA: Diagnosis present

## 2023-02-22 DIAGNOSIS — Z7902 Long term (current) use of antithrombotics/antiplatelets: Secondary | ICD-10-CM

## 2023-02-22 DIAGNOSIS — I739 Peripheral vascular disease, unspecified: Secondary | ICD-10-CM | POA: Diagnosis not present

## 2023-02-22 DIAGNOSIS — Z89611 Acquired absence of right leg above knee: Secondary | ICD-10-CM | POA: Diagnosis not present

## 2023-02-22 DIAGNOSIS — Z794 Long term (current) use of insulin: Secondary | ICD-10-CM | POA: Diagnosis not present

## 2023-02-22 DIAGNOSIS — Z833 Family history of diabetes mellitus: Secondary | ICD-10-CM | POA: Diagnosis not present

## 2023-02-22 DIAGNOSIS — Z800 Family history of malignant neoplasm of digestive organs: Secondary | ICD-10-CM | POA: Diagnosis not present

## 2023-02-22 DIAGNOSIS — Z882 Allergy status to sulfonamides status: Secondary | ICD-10-CM | POA: Diagnosis not present

## 2023-02-22 DIAGNOSIS — I6522 Occlusion and stenosis of left carotid artery: Secondary | ICD-10-CM | POA: Diagnosis not present

## 2023-02-22 DIAGNOSIS — E119 Type 2 diabetes mellitus without complications: Secondary | ICD-10-CM

## 2023-02-22 DIAGNOSIS — I6529 Occlusion and stenosis of unspecified carotid artery: Principal | ICD-10-CM

## 2023-02-22 DIAGNOSIS — Z87891 Personal history of nicotine dependence: Secondary | ICD-10-CM

## 2023-02-22 HISTORY — PX: PATCH ANGIOPLASTY: SHX6230

## 2023-02-22 HISTORY — PX: ENDARTERECTOMY: SHX5162

## 2023-02-22 LAB — GLUCOSE, CAPILLARY
Glucose-Capillary: 129 mg/dL — ABNORMAL HIGH (ref 70–99)
Glucose-Capillary: 169 mg/dL — ABNORMAL HIGH (ref 70–99)
Glucose-Capillary: 194 mg/dL — ABNORMAL HIGH (ref 70–99)
Glucose-Capillary: 288 mg/dL — ABNORMAL HIGH (ref 70–99)
Glucose-Capillary: 238 mg/dL — ABNORMAL HIGH (ref 70–99)

## 2023-02-22 LAB — CBC
HCT: 37.6 % — ABNORMAL LOW (ref 39.0–52.0)
Hemoglobin: 12.7 g/dL — ABNORMAL LOW (ref 13.0–17.0)
MCH: 31.8 pg (ref 26.0–34.0)
MCHC: 33.8 g/dL (ref 30.0–36.0)
MCV: 94.2 fL (ref 80.0–100.0)
Platelets: 150 10*3/uL (ref 150–400)
RBC: 3.99 MIL/uL — ABNORMAL LOW (ref 4.22–5.81)
RDW: 13.3 % (ref 11.5–15.5)
WBC: 7 10*3/uL (ref 4.0–10.5)
nRBC: 0 % (ref 0.0–0.2)

## 2023-02-22 LAB — CREATININE, SERUM
Creatinine, Ser: 1 mg/dL (ref 0.61–1.24)
GFR, Estimated: >60 mL/min (ref >60)

## 2023-02-22 LAB — POCT ACTIVATED CLOTTING TIME
Activated Clotting Time: 250 s
Activated Clotting Time: 293 s

## 2023-02-22 SURGERY — ENDARTERECTOMY, CAROTID
Anesthesia: General | Site: Neck | Laterality: Left

## 2023-02-22 MED ORDER — EMPAGLIFLOZIN 25 MG PO TABS
25.0000 mg | ORAL_TABLET | Freq: Every day | ORAL | Status: DC
  Administered 2023-02-23: 25 mg via ORAL
  Filled 2023-02-22: qty 1

## 2023-02-22 MED ORDER — OXYCODONE HCL 5 MG/5ML PO SOLN: 5.0000 mg | Freq: Once | ORAL | Status: DC | PRN

## 2023-02-22 MED ORDER — EPHEDRINE SULFATE-NACL 50-0.9 MG/10ML-% IV SOSY
PREFILLED_SYRINGE | INTRAVENOUS | Status: DC | PRN
  Administered 2023-02-22 (×3): 5 mg via INTRAVENOUS

## 2023-02-22 MED ORDER — PHENOL 1.4 % MT LIQD: 1.0000 | OROMUCOSAL | Status: DC | PRN

## 2023-02-22 MED ORDER — EZETIMIBE 10 MG PO TABS
10.0000 mg | ORAL_TABLET | Freq: Every day | ORAL | Status: DC
  Administered 2023-02-23: 10 mg via ORAL
  Filled 2023-02-22: qty 1

## 2023-02-22 MED ORDER — HYDRALAZINE HCL 20 MG/ML IJ SOLN: 5.0000 mg | INTRAMUSCULAR | Status: DC | PRN

## 2023-02-22 MED ORDER — SODIUM CHLORIDE 0.9 % IV SOLN
0.1500 ug/kg/min | INTRAVENOUS | Status: DC
  Administered 2023-02-22: .15 ug/kg/min via INTRAVENOUS
  Filled 2023-02-22: qty 2000

## 2023-02-22 MED ORDER — PROTAMINE SULFATE 10 MG/ML IV SOLN
INTRAVENOUS | Status: DC | PRN
Start: 2023-02-22 — End: 2023-02-22
  Administered 2023-02-22: 50 mg via INTRAVENOUS

## 2023-02-22 MED ORDER — ATORVASTATIN CALCIUM 40 MG PO TABS
40.0000 mg | ORAL_TABLET | Freq: Every day | ORAL | Status: DC
  Administered 2023-02-22: 40 mg via ORAL
  Filled 2023-02-22: qty 1

## 2023-02-22 MED ORDER — ONDANSETRON HCL 4 MG/2ML IJ SOLN
INTRAMUSCULAR | Status: DC | PRN
  Administered 2023-02-22: 4 mg via INTRAVENOUS

## 2023-02-22 MED ORDER — CHLORHEXIDINE GLUCONATE 0.12 % MT SOLN: 15.0000 mL | Freq: Once | OROMUCOSAL | Status: AC

## 2023-02-22 MED ORDER — ROCURONIUM BROMIDE 10 MG/ML (PF) SYRINGE
PREFILLED_SYRINGE | INTRAVENOUS | Status: DC | PRN
  Administered 2023-02-22 (×2): 10 mg via INTRAVENOUS
  Administered 2023-02-22: 60 mg via INTRAVENOUS

## 2023-02-22 MED ORDER — DOCUSATE SODIUM 100 MG PO CAPS
100.0000 mg | ORAL_CAPSULE | Freq: Every day | ORAL | Status: DC
  Administered 2023-02-23: 100 mg via ORAL
  Filled 2023-02-22: qty 1

## 2023-02-22 MED ORDER — ORAL CARE MOUTH RINSE: 15.0000 mL | Freq: Once | OROMUCOSAL | Status: AC

## 2023-02-22 MED ORDER — ACETAMINOPHEN 325 MG PO TABS: 325.0000 mg | ORAL_TABLET | ORAL | Status: DC | PRN

## 2023-02-22 MED ORDER — ALBUMIN HUMAN 5 % IV SOLN
INTRAVENOUS | Status: AC
  Filled 2023-02-22: qty 250

## 2023-02-22 MED ORDER — AMISULPRIDE (ANTIEMETIC) 5 MG/2ML IV SOLN
10.0000 mg | Freq: Once | INTRAVENOUS | Status: AC | PRN
  Administered 2023-02-22: 10 mg via INTRAVENOUS

## 2023-02-22 MED ORDER — ALUM & MAG HYDROXIDE-SIMETH 200-200-20 MG/5ML PO SUSP: 15.0000 mL | ORAL | Status: DC | PRN

## 2023-02-22 MED ORDER — SODIUM CHLORIDE 0.9 % IV SOLN: 500.0000 mL | Freq: Once | INTRAVENOUS | Status: DC | PRN

## 2023-02-22 MED ORDER — PHENYLEPHRINE HCL-NACL 20-0.9 MG/250ML-% IV SOLN
INTRAVENOUS | Status: DC | PRN
  Administered 2023-02-22: 30 ug/min via INTRAVENOUS

## 2023-02-22 MED ORDER — CHLORHEXIDINE GLUCONATE CLOTH 2 % EX PADS: 6.0000 | MEDICATED_PAD | Freq: Once | CUTANEOUS | Status: DC

## 2023-02-22 MED ORDER — ASPIRIN 81 MG PO TBEC
81.0000 mg | DELAYED_RELEASE_TABLET | Freq: Every day | ORAL | Status: DC
  Administered 2023-02-23: 81 mg via ORAL
  Filled 2023-02-22: qty 1

## 2023-02-22 MED ORDER — CLOPIDOGREL BISULFATE 75 MG PO TABS
75.0000 mg | ORAL_TABLET | Freq: Every day | ORAL | Status: DC
  Administered 2023-02-23: 75 mg via ORAL
  Filled 2023-02-22: qty 1

## 2023-02-22 MED ORDER — CHLORHEXIDINE GLUCONATE 0.12 % MT SOLN
OROMUCOSAL | Status: AC
  Administered 2023-02-22: 15 mL via OROMUCOSAL
  Filled 2023-02-22: qty 15

## 2023-02-22 MED ORDER — 0.9 % SODIUM CHLORIDE (POUR BTL) OPTIME
TOPICAL | Status: DC | PRN
  Administered 2023-02-22 (×3): 1000 mL

## 2023-02-22 MED ORDER — SODIUM CHLORIDE 0.9% FLUSH
3.0000 mL | Freq: Two times a day (BID) | INTRAVENOUS | Status: DC
  Administered 2023-02-22 – 2023-02-23 (×3): 3 mL via INTRAVENOUS

## 2023-02-22 MED ORDER — PIOGLITAZONE HCL 30 MG PO TABS
30.0000 mg | ORAL_TABLET | Freq: Every day | ORAL | Status: DC
  Administered 2023-02-22: 30 mg via ORAL
  Filled 2023-02-22 (×2): qty 1

## 2023-02-22 MED ORDER — LABETALOL HCL 5 MG/ML IV SOLN: 10.0000 mg | INTRAVENOUS | Status: DC | PRN

## 2023-02-22 MED ORDER — CEFAZOLIN SODIUM-DEXTROSE 2-4 GM/100ML-% IV SOLN
2.0000 g | INTRAVENOUS | Status: AC
  Administered 2023-02-22: 2 g via INTRAVENOUS
  Filled 2023-02-22: qty 100

## 2023-02-22 MED ORDER — SURGIFLO WITH THROMBIN (HEMOSTATIC MATRIX KIT) OPTIME
TOPICAL | Status: DC | PRN
Start: 2023-02-22 — End: 2023-02-22
  Administered 2023-02-22: 1 via TOPICAL

## 2023-02-22 MED ORDER — PROPOFOL 10 MG/ML IV BOLUS
INTRAVENOUS | Status: DC | PRN
  Administered 2023-02-22: 20 mg via INTRAVENOUS
  Administered 2023-02-22: 140 mg via INTRAVENOUS

## 2023-02-22 MED ORDER — SODIUM CHLORIDE 0.9 % IV SOLN: INTRAVENOUS | Status: DC

## 2023-02-22 MED ORDER — HEPARIN 6000 UNIT IRRIGATION SOLUTION
Status: DC | PRN
  Administered 2023-02-22: 1

## 2023-02-22 MED ORDER — FENTANYL CITRATE (PF) 100 MCG/2ML IJ SOLN
25.0000 ug | INTRAMUSCULAR | Status: DC | PRN
  Administered 2023-02-22: 25 ug via INTRAVENOUS

## 2023-02-22 MED ORDER — ACETAMINOPHEN 500 MG PO TABS
1000.0000 mg | ORAL_TABLET | Freq: Once | ORAL | Status: AC
  Administered 2023-02-22: 1000 mg via ORAL
  Filled 2023-02-22: qty 2

## 2023-02-22 MED ORDER — MAGNESIUM SULFATE 2 GM/50ML IV SOLN: 2.0000 g | Freq: Every day | INTRAVENOUS | Status: DC | PRN

## 2023-02-22 MED ORDER — INSULIN ASPART 100 UNIT/ML IJ SOLN: 0.0000 [IU] | INTRAMUSCULAR | Status: DC | PRN

## 2023-02-22 MED ORDER — HEPARIN SODIUM (PORCINE) 1000 UNIT/ML IJ SOLN
INTRAMUSCULAR | Status: DC | PRN
Start: 2023-02-22 — End: 2023-02-22
  Administered 2023-02-22: 9000 [IU] via INTRAVENOUS

## 2023-02-22 MED ORDER — METOPROLOL TARTRATE 5 MG/5ML IV SOLN: 2.0000 mg | INTRAVENOUS | Status: DC | PRN

## 2023-02-22 MED ORDER — CEFAZOLIN SODIUM-DEXTROSE 2-4 GM/100ML-% IV SOLN
2.0000 g | Freq: Three times a day (TID) | INTRAVENOUS | Status: AC
  Administered 2023-02-22 (×2): 2 g via INTRAVENOUS
  Filled 2023-02-22: qty 100

## 2023-02-22 MED ORDER — ACETAMINOPHEN 650 MG RE SUPP: 325.0000 mg | RECTAL | Status: DC | PRN

## 2023-02-22 MED ORDER — SODIUM CHLORIDE 0.9 % IV SOLN: 250.0000 mL | INTRAVENOUS | Status: AC | PRN

## 2023-02-22 MED ORDER — INSULIN ASPART 100 UNIT/ML IJ SOLN
0.0000 [IU] | Freq: Three times a day (TID) | INTRAMUSCULAR | Status: DC
  Administered 2023-02-22: 8 [IU] via SUBCUTANEOUS
  Administered 2023-02-23 (×2): 5 [IU] via SUBCUTANEOUS

## 2023-02-22 MED ORDER — BICTEGRAVIR-EMTRICITAB-TENOFOV 50-200-25 MG PO TABS
1.0000 | ORAL_TABLET | Freq: Every day | ORAL | Status: DC
  Administered 2023-02-23: 1 via ORAL
  Filled 2023-02-22: qty 1

## 2023-02-22 MED ORDER — OXYCODONE HCL 5 MG PO TABS: 5.0000 mg | ORAL_TABLET | Freq: Once | ORAL | Status: DC | PRN

## 2023-02-22 MED ORDER — OXYCODONE HCL 5 MG PO TABS: 5.0000 mg | ORAL_TABLET | ORAL | Status: DC | PRN

## 2023-02-22 MED ORDER — FENTANYL CITRATE (PF) 100 MCG/2ML IJ SOLN
INTRAMUSCULAR | Status: AC
  Filled 2023-02-22: qty 2

## 2023-02-22 MED ORDER — LACTATED RINGERS IV SOLN: INTRAVENOUS | Status: DC

## 2023-02-22 MED ORDER — LIDOCAINE 2% (20 MG/ML) 5 ML SYRINGE
INTRAMUSCULAR | Status: DC | PRN
  Administered 2023-02-22: 100 mg via INTRAVENOUS

## 2023-02-22 MED ORDER — SODIUM CHLORIDE 0.9% FLUSH: 3.0000 mL | INTRAVENOUS | Status: DC | PRN

## 2023-02-22 MED ORDER — ONDANSETRON HCL 4 MG/2ML IJ SOLN: 4.0000 mg | Freq: Four times a day (QID) | INTRAMUSCULAR | Status: DC | PRN

## 2023-02-22 MED ORDER — LISINOPRIL 20 MG PO TABS
40.0000 mg | ORAL_TABLET | Freq: Every day | ORAL | Status: DC
  Administered 2023-02-22: 40 mg via ORAL
  Filled 2023-02-22: qty 2

## 2023-02-22 MED ORDER — GUAIFENESIN-DM 100-10 MG/5ML PO SYRP: 15.0000 mL | ORAL_SOLUTION | ORAL | Status: DC | PRN

## 2023-02-22 MED ORDER — SODIUM CHLORIDE 0.9 % IV SOLN
0.1500 ug/kg/min | Freq: Once | INTRAVENOUS | Status: AC
  Administered 2023-02-22: .15 ug/kg/min via INTRAVENOUS
  Filled 2023-02-22: qty 1000

## 2023-02-22 MED ORDER — SUGAMMADEX SODIUM 200 MG/2ML IV SOLN
INTRAVENOUS | Status: DC | PRN
  Administered 2023-02-22: 200 mg via INTRAVENOUS

## 2023-02-22 MED ORDER — SENNOSIDES-DOCUSATE SODIUM 8.6-50 MG PO TABS: 1.0000 | ORAL_TABLET | Freq: Every evening | ORAL | Status: DC | PRN

## 2023-02-22 MED ORDER — PANTOPRAZOLE SODIUM 40 MG PO TBEC
40.0000 mg | DELAYED_RELEASE_TABLET | Freq: Every day | ORAL | Status: DC
  Administered 2023-02-23: 40 mg via ORAL
  Filled 2023-02-22: qty 1

## 2023-02-22 MED ORDER — CLEVIDIPINE BUTYRATE 0.5 MG/ML IV EMUL
INTRAVENOUS | Status: AC
  Filled 2023-02-22: qty 50

## 2023-02-22 MED ORDER — GLYCOPYRROLATE PF 0.2 MG/ML IJ SOSY
PREFILLED_SYRINGE | INTRAMUSCULAR | Status: DC | PRN
Start: 2023-02-22 — End: 2023-02-22
  Administered 2023-02-22: .1 mg via INTRAVENOUS

## 2023-02-22 MED ORDER — AMISULPRIDE (ANTIEMETIC) 5 MG/2ML IV SOLN
INTRAVENOUS | Status: AC
  Filled 2023-02-22: qty 4

## 2023-02-22 MED ORDER — POTASSIUM CHLORIDE CRYS ER 20 MEQ PO TBCR: 20.0000 meq | EXTENDED_RELEASE_TABLET | Freq: Every day | ORAL | Status: DC | PRN

## 2023-02-22 MED ORDER — HEPARIN SODIUM (PORCINE) 5000 UNIT/ML IJ SOLN
5000.0000 [IU] | Freq: Three times a day (TID) | INTRAMUSCULAR | Status: DC
  Administered 2023-02-23: 5000 [IU] via SUBCUTANEOUS
  Filled 2023-02-22: qty 1

## 2023-02-22 MED ORDER — BISACODYL 5 MG PO TBEC: 5.0000 mg | DELAYED_RELEASE_TABLET | Freq: Every day | ORAL | Status: DC | PRN

## 2023-02-22 MED ORDER — ALBUMIN HUMAN 5 % IV SOLN
12.5000 g | Freq: Once | INTRAVENOUS | Status: AC
  Administered 2023-02-22: 12.5 g via INTRAVENOUS

## 2023-02-22 MED ORDER — ALBUMIN HUMAN 5 % IV SOLN
INTRAVENOUS | Status: DC | PRN
Start: 2023-02-22 — End: 2023-02-22

## 2023-02-22 MED ORDER — DEXAMETHASONE SODIUM PHOSPHATE 10 MG/ML IJ SOLN
INTRAMUSCULAR | Status: DC | PRN
  Administered 2023-02-22: 10 mg via INTRAVENOUS

## 2023-02-22 MED ORDER — PROPOFOL 10 MG/ML IV BOLUS
INTRAVENOUS | Status: AC
  Filled 2023-02-22: qty 20

## 2023-02-22 MED ORDER — LACTATED RINGERS IV SOLN
INTRAVENOUS | Status: DC | PRN
Start: 2023-02-22 — End: 2023-02-22

## 2023-02-22 MED ORDER — HYDROMORPHONE HCL 1 MG/ML IJ SOLN: 0.5000 mg | INTRAMUSCULAR | Status: DC | PRN

## 2023-02-22 SURGICAL SUPPLY — 42 items
ADH SKN CLS APL DERMABOND .7 (GAUZE/BANDAGES/DRESSINGS) ×1
AGENT HMST KT MTR STRL THRMB (HEMOSTASIS) ×1
BAG COUNTER SPONGE SURGICOUNT (BAG) ×1 IMPLANT
BAG SPNG CNTER NS LX DISP (BAG) ×1
CANISTER SUCT 3000ML PPV (MISCELLANEOUS) ×1 IMPLANT
CATH ROBINSON RED A/P 18FR (CATHETERS) ×1 IMPLANT
CATH SUCT 10FR WHISTLE TIP (CATHETERS) ×1 IMPLANT
CLIP TI MEDIUM 6 (CLIP) ×1 IMPLANT
CLIP TI WIDE RED SMALL 6 (CLIP) ×1 IMPLANT
COVER PROBE CYLINDRICAL 5X96 (MISCELLANEOUS) IMPLANT
COVER PROBE W GEL 5X96 (DRAPES) IMPLANT
DERMABOND ADVANCED .7 DNX12 (GAUZE/BANDAGES/DRESSINGS) ×1 IMPLANT
DRAIN CHANNEL 15F RND FF W/TCR (WOUND CARE) IMPLANT
ELECT REM PT RETURN 9FT ADLT (ELECTROSURGICAL) ×1
ELECTRODE REM PT RTRN 9FT ADLT (ELECTROSURGICAL) ×1 IMPLANT
EVACUATOR SILICONE 100CC (DRAIN) IMPLANT
GLOVE SURG SS PI 7.5 STRL IVOR (GLOVE) ×3 IMPLANT
GOWN STRL REUS W/ TWL LRG LVL3 (GOWN DISPOSABLE) ×2 IMPLANT
GOWN STRL REUS W/ TWL XL LVL3 (GOWN DISPOSABLE) ×1 IMPLANT
GOWN STRL REUS W/TWL LRG LVL3 (GOWN DISPOSABLE) ×2
GOWN STRL REUS W/TWL XL LVL3 (GOWN DISPOSABLE) ×1
GRAFT VASC PATCH XENOSURE 1X14 (Vascular Products) IMPLANT
KIT BASIN OR (CUSTOM PROCEDURE TRAY) ×1 IMPLANT
KIT SHUNT ARGYLE CAROTID ART 6 (VASCULAR PRODUCTS) IMPLANT
KIT TURNOVER KIT B (KITS) ×1 IMPLANT
NS IRRIG 1000ML POUR BTL (IV SOLUTION) ×3 IMPLANT
PACK CAROTID (CUSTOM PROCEDURE TRAY) ×1 IMPLANT
PAD ARMBOARD 7.5X6 YLW CONV (MISCELLANEOUS) ×2 IMPLANT
POSITIONER HEAD DONUT 9IN (MISCELLANEOUS) ×1 IMPLANT
SHUNT CAROTID BYPASS 10 (VASCULAR PRODUCTS) IMPLANT
SHUNT CAROTID BYPASS 12FRX15.5 (VASCULAR PRODUCTS) IMPLANT
SURGIFLO W/THROMBIN 8M KIT (HEMOSTASIS) IMPLANT
SUT PROLENE 6 0 BV (SUTURE) ×2 IMPLANT
SUT PROLENE 7 0 BV1 MDA (SUTURE) IMPLANT
SUT SILK 3 0 (SUTURE)
SUT SILK 3-0 18XBRD TIE 12 (SUTURE) IMPLANT
SUT VIC AB 3-0 SH 27 (SUTURE) ×2
SUT VIC AB 3-0 SH 27X BRD (SUTURE) ×2 IMPLANT
SUT VIC AB 3-0 X1 27 (SUTURE) ×1 IMPLANT
SYR CONTROL 10ML LL (SYRINGE) IMPLANT
TOWEL GREEN STERILE (TOWEL DISPOSABLE) ×1 IMPLANT
WATER STERILE IRR 1000ML POUR (IV SOLUTION) ×1 IMPLANT

## 2023-02-22 NOTE — Progress Notes (Addendum)
PHARMACIST LIPID MONITORING   Nathan [name] is a 72 y.o. male admitted on 02/22/2023 with Carotid Artery Stenosis.  Pharmacy has been consulted to optimize lipid-lowering therapy with the indication of secondary prevention for clinical ASCVD.  Recent Labs:  Lipid Panel (last 6 months):   No results found for: "CHOL", "TRIG", "HDL", "CHOLHDL", "VLDL", "LDLCALC", "LDLDIRECT"  Hepatic function panel (last 6 months):   Lab Results  Component Value Date   AST 25 02/14/2023   ALT 23 02/14/2023   ALKPHOS 50 02/14/2023   BILITOT 0.6 02/14/2023    SCr (since admission):   Serum creatinine: 1 mg/dL 16/10/96 0454 Estimated creatinine clearance: 73.2 mL/min  Current therapy and lipid therapy tolerance Current lipid-lowering therapy: lipitor 40mg  and zetia Documented or reported allergies or intolerances to lipid-lowering therapies (if applicable): n/a  Assessment:  lipid panel ordered for 10/18 with AM labs  Plan:   Follow up lab results in the morning to guide therapy and any recommended changes to lipid lowering therapies.    Smitty Cords, PharmD 02/22/2023, 2:20 PM

## 2023-02-22 NOTE — Discharge Instructions (Signed)
[facility]  Discharge Instructions   Carotid Surgery  Please refer to the following instructions for your post-procedure care. Your surgeon or physician assistant will discuss any changes with you.  Activity  You are encouraged to walk as much as you can. You can slowly return to normal activities but must avoid strenuous activity and heavy lifting until your doctor tell you it's okay. Avoid activities such as vacuuming or swinging a golf club. You can drive after one week if you are comfortable and you are no longer taking prescription pain medications. It is normal to feel tired for serval weeks after your surgery. It is also normal to have difficulty with sleep habits, eating, and bowel movements after surgery. These will go away with time.  Bathing/Showering  Shower daily after you go home. Do not soak in a bathtub, hot tub, or swim until the incision heals completely.  Incision Care  Shower every day. Clean your incision with mild soap and water. Pat the area dry with a clean towel. You do not need a bandage unless otherwise instructed. Do not apply any ointments or creams to your incision. You may have skin glue on your incision. Do not peel it off. It will come off on its own in about one week. Your incision may feel thickened and raised for several weeks after your surgery. This is normal and the skin will soften over time.   For Men Only: It's okay to shave around the incision but do not shave the incision itself for 2 weeks. It is common to have numbness under your chin that could last for several months.  Diet  Resume your normal diet. There are no special food restrictions following this procedure. A low fat/low cholesterol diet is recommended for all patients with vascular disease. In order to heal from your surgery, it is CRITICAL to get adequate nutrition. Your body requires vitamins, minerals, and protein. Vegetables are the best source of  vitamins and minerals. Vegetables also provide the perfect balance of protein. Processed food has little nutritional value, so try to avoid this.  Medications  Resume taking all of your medications unless your doctor or physician assistant tells you not to. If your incision is causing pain, you may take over-the- counter pain relievers such as acetaminophen (Tylenol). If you were prescribed a stronger pain medication, please be aware these medications can cause nausea and constipation. Prevent nausea by taking the medication with a snack or meal. Avoid constipation by drinking plenty of fluids and eating foods with a high amount of fiber, such as fruits, vegetables, and grains.   Do not take Tylenol if you are taking prescription pain medications.  Follow Up  Our office will schedule a follow up appointment 2-3 weeks following discharge.  Please call us immediately for any of the following conditions  . Increased pain, redness, drainage (pus) from your incision site. . Fever of 101 degrees or higher. . If you should develop stroke (slurred speech, difficulty swallowing, weakness on one side of your body, loss of vision) you should call 911 and go to the nearest emergency room. .  Reduce your risk of vascular disease:  . Stop smoking. If you would like help call [facility] at 1-800-QUIT-NOW (401-214-1391) or [place] at [phone]. . Manage your cholesterol . Maintain a desired weight . Control your diabetes . Keep your blood pressure down .  If you have any questions, please call the office at [phone].

## 2023-02-22 NOTE — Interval H&P Note (Signed)
History and Physical Interval Note:  02/22/2023 7:23 AM  Nathan [name]  has presented today for surgery, with the diagnosis of Carotid Artery Stenosis.  The various methods of treatment have been discussed with the patient and family. After consideration of risks, benefits and other options for treatment, the patient has consented to  Procedure(s): LEFT ENDARTERECTOMY CAROTID (Left) as a surgical intervention.  The patient's history has been reviewed, patient examined, no change in status, stable for surgery.  I have reviewed the patient's chart and labs.  Questions were answered to the patient's satisfaction.     Durene Cal

## 2023-02-22 NOTE — Anesthesia Procedure Notes (Signed)
Procedure Name: Intubation Date/Time: 02/22/2023 7:52 AM  Performed by: Alease Medina, CRNAPre-anesthesia Checklist: Patient identified, Emergency Drugs available, Suction available and Patient being monitored Patient Re-evaluated:Patient Re-evaluated prior to induction Oxygen Delivery Method: Circle system utilized Preoxygenation: Pre-oxygenation with 100% oxygen Induction Type: IV induction Ventilation: Mask ventilation without difficulty Laryngoscope Size: Mac and 4 Grade View: Grade I Tube type: Oral Tube size: 7.5 mm Number of attempts: 1 Airway Equipment and Method: Stylet and Oral airway Placement Confirmation: ETT inserted through vocal cords under direct vision, positive ETCO2 and breath sounds checked- equal and bilateral Secured at: 23 cm Tube secured with: Tape Dental Injury: Teeth and Oropharynx as per pre-operative assessment

## 2023-02-22 NOTE — Op Note (Signed)
Patient name: Nathan [name] MRN: [id number] DOB: [birth year] Sex: male  02/22/2023 Pre-operative Diagnosis: Asymptomatic   left carotid stenosis Post-operative diagnosis:  Same Surgeon:  Durene Cal Assistants:  Adonis Housekeeper, PA Procedure:    left carotid Endarterectomy with bovine pericardial patch angioplasty Anesthesia:  General Blood Loss:  200 Specimens:  Carotid Plaque to pathology  Findings:  80 %stenosis; Thrombus:  none  Indications: This is a 72 year old gentleman with progressive high-grade bilateral carotid stenosis.  CT scan showed the left side was greater than 80% and the right was greater than 75%.  We discussed proceeding with endarterectomy.  Risks and benefits were discussed.  All questions answered.  Procedure:  The patient was identified in the holding area and taken to [facility] OR ROOM 12  The patient was then placed supine on the table.   General endotrachial anesthesia was administered.  The patient was prepped and draped in the usual sterile fashion.  A time out was called and antibiotics were administered.  Due to the complexity of the case, PA was necessary.  She helped to expedite the case by providing suction and retraction for exposure.  She help with the anastomosis and patch repair by following the suture.   The incision was made along the anterior border of the left sternocleidomastoid muscle.  Cautery was used to dissect through the subcutaneous tissue.  The platysma muscle was divided with cautery.  The internal jugular vein was exposed along its anterior medial border.  The common facial vein was exposed and then divided between 2-0 silk ties and metal clips.  The common carotid artery was then circumferentially exposed and encircled with an umbilical tape.  The vagus nerve was identified and protected.  Next sharp dissection was used to expose the external carotid artery and the superior thyroid artery.  The were encircled with a blue vessel loop and a 2-0 silk  tie respectively.  Finally, the internal carotid was carefully dissected free.  An umbilical tape was placed around the internal carotid artery distal to the diseased segment.  The hypoglossal nerve was visualized throughout and protected.  The patient was given systemic heparinization.  A bovine carotid patch was selected and prepared on the back table.  A 10 french shunt was also prepared.  After blood pressure readings were appropriate and the heparin had been given time to circulate, the internal carotid artery was occluded with a baby Gregory clamp.  The external and common carotid arteries were then occluded with vascular clamps and the 2-0 tie tightened on the superior thyroid artery.  A #11 blade was used to make an arteriotomy in the common carotid artery.  This was extended with Potts scissors along the anterior and lateral border of the common and internal carotid artery.  Approximately 80% stenosis was identified.  There was no thrombus identified.  The 10 french shunt was not placed, as there was excellent backbleeding.  A kleiner kuntz elevator was used to perform endarterectomy.  An eversion endarterectomy was performed in the external carotid artery.  A good distal endpoint was obtained in the internal carotid artery.  The specimen was removed and sent to pathology.  Heparinized saline was used to irrigate the endarterectomized field.  All potential embolic debris was removed.  Bovine pericardial patch angioplasty was then performed using a running 6-0 Prolene.  The common internal and external carotid arteries were all appropriately flushed. The artery was again irrigated with heparin saline.  The anastomosis was then secured. The  clamp was first released on the external carotid artery followed by the common carotid artery approximately 30 seconds later, bloodflow was reestablish through the internal carotid artery.  Next, a hand-held  Doppler was used to evaluate the signals in the common,  external, and internal  carotid arteries, all of which had appropriate signals. I then administered  50 mg protamine. The wound was then irrigated.  After hemostasis was achieved, the carotid sheath was reapproximated with 3-0 Vicryl. The  platysma muscle was reapproximated with running 3-0 Vicryl. The skin  was closed with 4-0 Vicryl. Dermabond was placed on the skin. The  patient was then successfully extubated. His neurologic exam was  similar to his preprocedural exam. The patient was then taken to recovery room  in stable condition. There were no complications.     Disposition:  To PACU in stable condition.  Relevant Operative Details: Extensive plaque was found at the carotid bifurcation creating a greater than 80% stenosis.  The plaque extended up into the internal carotid artery for approximately 2 cm.  Additionally, the plaque went down to the level of the clavicle.  This was all removed with endarterectomy.  A long bovine pericardial patch was placed.  I did not use a shunt as there was excellent backbleeding.  The patient awoke neurologically intact.  Juleen China, M.D., [facility] [place] Office: [phone] Pager:  [phone]

## 2023-02-22 NOTE — Anesthesia Procedure Notes (Signed)
Arterial Line Insertion Start/End10/17/2024 7:00 AM Performed by: Alease Medina, CRNA, CRNA  Patient location: Pre-op. Preanesthetic checklist: patient identified, IV checked, site marked, risks and benefits discussed, surgical consent, monitors and equipment checked, pre-op evaluation, timeout performed and anesthesia consent Lidocaine 1% used for infiltration Left, radial was placed Catheter size: 20 G Hand hygiene performed  and maximum sterile barriers used   Attempts: 2 Procedure performed without using ultrasound guided technique. Following insertion, dressing applied and Biopatch. Post procedure assessment: normal and unchanged  Patient tolerated the procedure well with no immediate complications.

## 2023-02-22 NOTE — Progress Notes (Addendum)
  Day of Surgery Note    Subjective:  resting comfortably; says he feels like he had some trouble swallowing but ate lunch with carrots and some meat without issue   Vitals:   02/22/23 1302 02/22/23 1402  BP: (!) 111/57 (!) 104/55  Pulse: 76 80  Resp: 15 15  Temp: 97.6 F (36.4 C) 97.8 F (36.6 C)  SpO2: 97% 98%    Incisions:   left neck incision is clean without hematoma Neuro:  intact; moving all extremities equally; tongue is midline Cardiac:  regular Lungs:  non labored Abdomen:  abdominal aortic pulse is not palpable   Assessment/Plan:  This is a 72 y.o. male who is s/p  Left CEA for asymptomatic carotid artery stenosis  -pt doing well up on progressive unit and neuro in tact -pt with family hx of AAA with a couple of brothers.  Will order AAA duplex while in the hospital.   One brother had open repair with complicated post op course.  -continue asa/statin/plavix -anticipate discharge home tomorrow if no issues overnight   Doreatha Massed, PA-C 02/22/2023 3:23 PM [phone]  I agree with the above.  I have seen and evaluated the patient in the PACU.  He was neurologically intact.  Durene Cal

## 2023-02-22 NOTE — Transfer of Care (Signed)
Immediate Anesthesia Transfer of Care Note  Patient: Nathan [name]  Procedure(s) Performed: LEFT ENDARTERECTOMY CAROTID (Left: Neck) PATCH ANGIOPLASTY 1CMx14CM XENOSURE PATCH (Left: Neck)  Patient Location: PACU  Anesthesia Type:General  Level of Consciousness: drowsy and patient cooperative  Airway & Oxygen Therapy: Patient Spontanous Breathing and Patient connected to nasal cannula oxygen  Post-op Assessment: Report given to RN, Post -op Vital signs reviewed and stable, Patient moving all extremities X 4, and Patient able to stick tongue midline  Post vital signs: Reviewed and stable  Last Vitals:  Vitals Value Taken Time  BP See flowsheet    Temp    Pulse 76   Resp 14   SpO2 94   VSS see PACU flowsheet  Last Pain:  Vitals:   02/22/23 0615  PainSc: 0-No pain         Complications: No notable events documented.

## 2023-02-22 NOTE — Anesthesia Postprocedure Evaluation (Signed)
Anesthesia Post Note  Patient: QUSAY VILLADA  Procedure(s) Performed: LEFT ENDARTERECTOMY CAROTID (Left: Neck) PATCH ANGIOPLASTY 1CMx14CM XENOSURE PATCH (Left: Neck)     Patient location during evaluation: PACU Anesthesia Type: General Level of consciousness: awake and alert Pain management: pain level controlled Vital Signs Assessment: post-procedure vital signs reviewed and stable Respiratory status: spontaneous breathing, nonlabored ventilation and respiratory function stable Cardiovascular status: blood pressure returned to baseline Postop Assessment: no apparent nausea or vomiting Anesthetic complications: no   No notable events documented.  Last Vitals:  Vitals:   02/22/23 1119 02/22/23 1125  BP: (!) 94/51 (!) 101/49  Pulse: 64 65  Resp: 13 12  Temp:    SpO2: 94% 95%    Last Pain:  Vitals:   02/22/23 1030  PainSc: 0-No pain                 Shanda Howells

## 2023-02-22 NOTE — Progress Notes (Signed)
Dr. Myra Gianotti please call patient's neighbor after his procedure.  His name is Oletta Cohn and his # [phone].

## 2023-02-22 NOTE — Progress Notes (Signed)
Pt arrived from ...cath.., A/ox 4...pt denies any pain, MD aware,CCMD called. CHG bath given,no further needs at this time

## 2023-02-23 ENCOUNTER — Encounter (HOSPITAL_COMMUNITY): Payer: Self-pay | Admitting: Surgery

## 2023-02-23 ENCOUNTER — Other Ambulatory Visit (HOSPITAL_COMMUNITY): Payer: Self-pay

## 2023-02-23 LAB — CBC
HCT: 35.2 % — ABNORMAL LOW (ref 39.0–52.0)
Hemoglobin: 12 g/dL — ABNORMAL LOW (ref 13.0–17.0)
MCH: 32.2 pg (ref 26.0–34.0)
MCHC: 34.1 g/dL (ref 30.0–36.0)
MCV: 94.4 fL (ref 80.0–100.0)
Platelets: 139 10*3/uL — ABNORMAL LOW (ref 150–400)
RBC: 3.73 MIL/uL — ABNORMAL LOW (ref 4.22–5.81)
RDW: 13.5 % (ref 11.5–15.5)
WBC: 8.3 10*3/uL (ref 4.0–10.5)
nRBC: 0 % (ref 0.0–0.2)

## 2023-02-23 LAB — BASIC METABOLIC PANEL
Anion gap: 11 (ref 5–15)
BUN: 23 mg/dL (ref 8–23)
CO2: 23 mmol/L (ref 22–32)
Calcium: 9.3 mg/dL (ref 8.9–10.3)
Chloride: 104 mmol/L (ref 98–111)
Creatinine, Ser: 1.13 mg/dL (ref 0.61–1.24)
GFR, Estimated: >60 mL/min (ref >60)
Glucose, Bld: 229 mg/dL — ABNORMAL HIGH (ref 70–99)
Potassium: 3.5 mmol/L (ref 3.5–5.1)
Sodium: 138 mmol/L (ref 135–145)

## 2023-02-23 LAB — LIPID PANEL
Cholesterol: 89 mg/dL (ref 0–200)
HDL: 30 mg/dL — ABNORMAL LOW (ref >40)
LDL Cholesterol: 36 mg/dL (ref 0–99)
Total CHOL/HDL Ratio: 3 {ratio}
Triglycerides: 117 mg/dL (ref <150)
VLDL: 23 mg/dL (ref 0–40)

## 2023-02-23 LAB — GLUCOSE, CAPILLARY
Glucose-Capillary: 216 mg/dL — ABNORMAL HIGH (ref 70–99)
Glucose-Capillary: 227 mg/dL — ABNORMAL HIGH (ref 70–99)

## 2023-02-23 MED ORDER — ASPIRIN 81 MG PO TBEC: 81.0000 mg | DELAYED_RELEASE_TABLET | Freq: Every day | ORAL | Status: DC

## 2023-02-23 MED ORDER — OXYCODONE HCL 5 MG PO TABS
5.0000 mg | ORAL_TABLET | Freq: Four times a day (QID) | ORAL | 0 refills | Status: DC | PRN
  Filled 2023-02-23: qty 12, 3d supply, fill #0

## 2023-02-23 NOTE — Progress Notes (Signed)
PHARMACIST LIPID MONITORING   Nathan [name] is a 72 y.o. male admitted on 02/22/2023 for L carotid endarterectomy.  Pharmacy has been consulted to optimize lipid-lowering therapy with the indication of secondary prevention for clinical ASCVD.  Recent Labs:  Lipid Panel (last 6 months):   Lab Results  Component Value Date   CHOL 89 02/23/2023   TRIG 117 02/23/2023   HDL 30 (L) 02/23/2023   CHOLHDL 3.0 02/23/2023   VLDL 23 02/23/2023   LDLCALC 36 02/23/2023    Hepatic function panel (last 6 months):   Lab Results  Component Value Date   AST 25 02/14/2023   ALT 23 02/14/2023   ALKPHOS 50 02/14/2023   BILITOT 0.6 02/14/2023    SCr (since admission):   Serum creatinine: 1.13 mg/dL 63/87/56 4332 Estimated creatinine clearance: 64.8 mL/min  Current therapy and lipid therapy tolerance Current lipid-lowering therapy: Atorvastatin 40 mg and Ezetimibe 10 mg daily Previous lipid-lowering therapies (if applicable): n/a Documented or reported allergies or intolerances to lipid-lowering therapies (if applicable): n/a  Assessment: Target LDL < 55, at goal  Plan:   Continue Atorvastatin 40 mg and Ezetimibe 10 mg daily.  Dennie Fetters, RPh 02/23/2023, 10:51 AM

## 2023-02-23 NOTE — Progress Notes (Addendum)
  Progress Note    02/23/2023 7:28 AM 1 Day Post-Op  Subjective:  no complaints.  Denies stroke like symptoms overnight.  Feeling ready for d/c home   Vitals:   02/23/23 0200 02/23/23 0400  BP: (!) 100/51   Pulse: 70   Resp: 17   Temp:  97.8 F (36.6 C)  SpO2: 93% 92%   Physical Exam: Lungs:  non labored Incisions:  L neck c/d/i Extremities:  moving all ext well Neurologic: CN grossly intact  CBC    Component Value Date/Time   WBC 8.3 02/23/2023 0530   RBC 3.73 (L) 02/23/2023 0530   HGB 12.0 (L) 02/23/2023 0530   HCT 35.2 (L) 02/23/2023 0530   HCT 24.8 (L) 01/02/2019 0455   PLT 139 (L) 02/23/2023 0530   MCV 94.4 02/23/2023 0530   MCH 32.2 02/23/2023 0530   MCHC 34.1 02/23/2023 0530   RDW 13.5 02/23/2023 0530   LYMPHSABS 3,986 (H) 08/07/2022 1005   MONOABS 0.6 06/21/2021 1006   EOSABS 270 08/07/2022 1005   BASOSABS 58 08/07/2022 1005    BMET    Component Value Date/Time   NA 138 02/23/2023 0530   NA 138 01/25/2017 1455   K 3.5 02/23/2023 0530   CL 104 02/23/2023 0530   CO2 23 02/23/2023 0530   GLUCOSE 229 (H) 02/23/2023 0530   BUN 23 02/23/2023 0530   BUN 9 01/25/2017 1455   CREATININE 1.13 02/23/2023 0530   CREATININE 1.25 08/07/2022 1005   CALCIUM 9.3 02/23/2023 0530   GFRNONAA >60 02/23/2023 0530   GFRNONAA 82 07/29/2020 0942   GFRAA 95 07/29/2020 0942    INR    Component Value Date/Time   INR 1.0 02/14/2023 0933     Intake/Output Summary (Last 24 hours) at 02/23/2023 0728 Last data filed at 02/23/2023 0644 Gross per 24 hour  Intake 1290 ml  Output 1250 ml  Net 40 ml     Assessment/Plan:  72 y.o. male is s/p L CEA for asymptomatic stenosis 1 Day Post-Op   Neuro exam at baseline; no neuro events overnight L neck incision is well appearing Ok for discharge home today if voiding Office will arrange follow up in 2-3 weeks   Emilie Rutter, PA-C [facility] [phone] 02/23/2023 7:28 AM   I agree with the  above.  Have seen and evaluate the patient.  He is postoperative day #1 from a left sided carotid endarterectomy.  He is neurologically intact.  His incision is clean and dry without hematoma.  Anticipate discharge home today  Durene Cal

## 2023-02-25 NOTE — Discharge Summary (Signed)
Discharge Summary     Nathan [name] [birth year] 72 y.o. male  [id number]  Admission Date: 02/22/2023  Discharge Date: 02/23/23  Physician: Dr. Myra Gianotti  Admission Diagnosis: Carotid artery stenosis [I65.29] Carotid stenosis, asymptomatic, left [I65.22]  Discharge Day services:   See progress note 02/23/2023  Hospital Course:  Nathan [name] is a 71 year old male who came in as an outpatient and underwent left carotid endarterectomy with bovine patch angioplasty by Dr. Myra Gianotti on 02/22/2023 due to asymptomatic high-grade stenosis of the left ICA.  He tolerated the procedure well and was admitted to the hospital postoperatively.  POD #1 neuro exam remained at baseline.  He is feeling ready for discharge home.  He will follow-up in the office in 2 to 3 weeks for wound check.  He will continue all his current home medications.  He was prescribed 2 to 3 days of narcotic pain medication for continued postoperative pain control.  He was discharged home in stable condition.   Recent Labs    02/23/23 0530  NA 138  K 3.5  CL 104  CO2 23  GLUCOSE 229*  BUN 23  CALCIUM 9.3   Recent Labs    02/22/23 1243 02/23/23 0530  WBC 7.0 8.3  HGB 12.7* 12.0*  HCT 37.6* 35.2*  PLT 150 139*   No results for input(s): "INR" in the last 72 hours.     Discharge Diagnosis:  Carotid artery stenosis [I65.29] Carotid stenosis, asymptomatic, left [I65.22]  Secondary Diagnosis: Patient Active Problem List   Diagnosis Date Noted   Carotid artery stenosis 02/22/2023   Carotid stenosis, asymptomatic, left 02/22/2023   Chronic diarrhea 06/20/2022   Antiplatelet or antithrombotic long-term use 06/20/2022   Pancreatic insufficiency 06/04/2022   Dyslipidemia 09/12/2021   Tobacco use disorder, moderate, in sustained remission 08/30/2020   Type 2 DM with diabetic peripheral angiopathy w/o gangrene (HCC) 02/19/2019   History of syphilis 10/09/2013   Atherosclerosis of native artery of  extremity with intermittent claudication (HCC) 04/08/2012   Occlusion and stenosis of carotid artery without mention of cerebral infarction 12/11/2011   Healthcare maintenance 11/23/2011   Benign essential HTN 02/13/2008   Hyperlipidemia associated with type 2 diabetes mellitus (HCC) 07/29/2007   HIV disease (HCC) 02/12/2006   COLONIC POLYPS, ADENOMATOUS 02/12/2006   Multiple sclerosis (HCC) 02/12/2006   PERIPHERAL VASCULAR DISEASE 02/12/2006   GERD 02/12/2006   Hepatitis B core antibody positive 02/12/2006   Past Medical History:  Diagnosis Date   Asthma    per 2003 [facility] pulm records pfts    Blood dyscrasia    HIV   Clotting disorder (HCC)    Colon polyps    noted previous colonoscopy [facility]   DDD (degenerative disc disease)    cervical spine   Depression    Diabetes mellitus    dx 2010   Diarrhea 02/24/2021   Dizziness 08/25/2021   Falls 08/25/2021   Fever    unknwon origin   GERD (gastroesophageal reflux disease)    Head swelling 05/23/2017   Hep C w/o coma, chronic (HCC)    History of syphilis    noted [facility] records   HIV infection (HCC)    undetectable viral load and CD4 ct 667 as of 11/2011   Hyperlipidemia    Hypertension    MRSA (methicillin resistant Staphylococcus aureus)    Multiple sclerosis (HCC)    in remission as of 11/2011 (diagnosed late 1980s)   Pain in limb-Right Leg 10/13/2013   Pancreas (digestive gland) works poorly  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HCC)    2002   Pneumonia    Prosthesis fitting 03/10/2019   PVD (peripheral vascular disease) (HCC)    Left Stent 07/02/2008   Scalp lesion 07/26/2017   UTI (urinary tract infection)    Wears dentures     Allergies as of 02/23/2023       Reactions   Sulfonamide Derivatives Hives        Medication List     TAKE these medications    aspirin EC 81 MG tablet Take 1 tablet (81 mg total) by mouth daily at 6 (six) AM. Swallow whole.   atorvastatin 40 MG tablet Commonly known as:  LIPITOR TAKE 1 TABLET(40 MG) BY MOUTH DAILY   Bayer Microlet 2 Lancing Devic Misc Use to check blood sugar up to 3 ties a day   BD Pen Needle Nano U/F 32G X 4 MM Misc Generic drug: Insulin Pen Needle 1 Device by Other route daily in the afternoon. USE AS DIRECTED FOUR TIMES DAILY. Dx code  E11.42   Biktarvy 50-200-25 MG Tabs tablet Generic drug: bictegravir-emtricitabine-tenofovir AF Take 1 tablet by mouth daily.   CeQur Simplicity Inserter Misc Use to Apply a new cecur simplicity patch as directed every 2 days   CeQur Simplicity 2U Devi Generic drug: injection device for insulin 1 Device by Other route once for 1 dose.   CeQur Simplicity 2U Devi Generic drug: injection device for insulin change every 2 days   cilostazol 50 MG tablet Commonly known as: PLETAL TAKE 1 TABLET(50 MG) BY MOUTH TWICE DAILY   clopidogrel 75 MG tablet Commonly known as: PLAVIX Take 1 tablet (75 mg total) by mouth daily.   empagliflozin 25 MG Tabs tablet Commonly known as: Jardiance Take 1 tablet (25 mg total) by mouth daily before breakfast.   ezetimibe 10 MG tablet Commonly known as: Zetia Take 1 tablet (10 mg total) by mouth daily.   freestyle lancets Use 5 times daily to check blood sugar.   FreeStyle Libre 3 Reader [place] 1 each by Does not apply route as directed. Use  with freestyle libre 3 sensors to monitor blood sugar continuously   FreeStyle Libre 3 Sensor Misc Place 1 sensor on the skin every 14 days. Use to check glucose continuously   FREESTYLE LITE test strip Generic drug: glucose blood Use 5 times daily to check blood sugar.   FREESTYLE LITE test strip Generic drug: glucose blood 1 each by Other route 3 (three) times daily. Use as instructed   Contour Next Test test strip Generic drug: glucose blood USE TO CHECK BLOOD SUGAR FIVE TIMES DAILY   FREESTYLE LITE test strip Generic drug: glucose blood Use as instructed   FreeStyle Lite w/Device Kit 1 Device by Does  not apply route daily in the afternoon.   HumaLOG 100 UNIT/ML injection Generic drug: insulin lispro Max daily 110 units per CeQur   ibuprofen 200 MG tablet Commonly known as: ADVIL Take 600 mg by mouth every 6 (six) hours as needed for moderate pain.   Insulin Syringe-Needle U-100 30G X 5/16" 1 ML Misc Commonly known as: GNP Insulin Syringes 1 Device by Does not apply route daily in the afternoon.   lipase/protease/amylase 16109 UNITS Cpep capsule Commonly known as: Creon Take 1 capsule (36,000 Units total) by mouth 3 (three) times daily before meals. What changed:  when to take this additional instructions   lisinopril 40 MG tablet Commonly known as: ZESTRIL TAKE 1 TABLET BY MOUTH DAILY. HOLD  IF SYSTOLIC BLOOD PRESSURE IS LESS THAN 110   omeprazole 40 MG capsule Commonly known as: PRILOSEC TAKE 1 CAPSULE(40 MG) BY MOUTH DAILY   oxyCODONE 5 MG immediate release tablet Commonly known as: Oxy IR/ROXICODONE Take 1 tablet (5 mg total) by mouth every 6 (six) hours as needed for moderate pain (pain score 4-6).   Ozempic (2 MG/DOSE) 8 MG/3ML Sopn Generic drug: Semaglutide (2 MG/DOSE) 2 mg by Other route once a week.   pioglitazone 30 MG tablet Commonly known as: ACTOS Take 1 tablet (30 mg total) by mouth daily.   Toujeo Max SoloStar 300 UNIT/ML Solostar Pen Generic drug: insulin glargine (2 Unit Dial) Inject 60 Units into the skin in the morning.         Discharge Instructions:   [facility] Discharge Instructions Carotid Endarterectomy (CEA)  Please refer to the following instructions for your post-procedure care. Your surgeon or physician assistant will discuss any changes with you.  Activity  You are encouraged to walk as much as you can. You can slowly return to normal activities but must avoid strenuous activity and heavy lifting until your doctor tell you it's OK. Avoid activities such as vacuuming or swinging a golf club.  You can drive after one week if you are comfortable and you are no longer taking prescription pain medications. It is normal to feel tired for serval weeks after your surgery. It is also normal to have difficulty with sleep habits, eating, and bowel movements after surgery. These will go away with time.  Bathing/Showering  You may shower after you come home. Do not soak in a bathtub, hot tub, or swim until the incision heals completely.  Incision Care  Shower every day. Clean your incision with mild soap and water. Pat the area dry with a clean towel. You do not need a bandage unless otherwise instructed. Do not apply any ointments or creams to your incision. You may have skin glue on your incision. Do not peel it off. It will come off on its own in about one week. Your incision may feel thickened and raised for several weeks after your surgery. This is normal and the skin will soften over time. For Men Only: It's OK to shave around the incision but do not shave the incision itself for 2 weeks. It is common to have numbness under your chin that could last for several months.  Diet  Resume your normal diet. There are no special food restrictions following this procedure. A low fat/low cholesterol diet is recommended for all patients with vascular disease. In order to heal from your surgery, it is CRITICAL to get adequate nutrition. Your body requires vitamins, minerals, and protein. Vegetables are the best source of vitamins and minerals. Vegetables also provide the perfect balance of protein. Processed food has little nutritional value, so try to avoid this.  Medications  Resume taking all of your medications unless your doctor or physician assistant tells you not to.  If your incision is causing pain, you may take over-the- counter pain relievers such as acetaminophen (Tylenol). If you were prescribed a stronger pain medication, please be aware these medications can cause nausea and constipation.   Prevent nausea by taking the medication with a snack or meal. Avoid constipation by drinking plenty of fluids and eating foods with a high amount of fiber, such as fruits, vegetables, and grains. Do not take Tylenol if you are taking prescription pain medications.  Follow Up  Our  office will schedule a follow up appointment 2-3 weeks following discharge.  Please call us immediately for any of the following conditions  Increased pain, redness, drainage (pus) from your incision site. Fever of 101 degrees or higher. If you should develop stroke (slurred speech, difficulty swallowing, weakness on one side of your body, loss of vision) you should call 911 and go to the nearest emergency room.  Reduce your risk of vascular disease:  Stop smoking. If you would like help call [facility] at 1-800-QUIT-NOW ((817)417-3889) or [place] at [phone]. Manage your cholesterol Maintain a desired weight Control your diabetes Keep your blood pressure down  If you have any questions, please call the office at [phone].  Disposition: home  Patient's condition: is Good  Follow up: 1. [facility] in 2 weeks.   Emilie Rutter, PA-C [facility] [phone]   --- For [facility] Registry use ---   Modified Rankin score at D/C (0-6): 0  IV medication needed for:  1. Hypertension: No 2. Hypotension: No  Post-op Complications: No  1. Post-op CVA or TIA: No  If yes: Event classification (right eye, left eye, right cortical, left cortical, verterobasilar, other):   If yes: Timing of event (intra-op, <6 hrs post-op, >=6 hrs post-op, unknown):   2. CN injury: No  If yes: CN  injuried   3. Myocardial infarction: No  If yes: Dx by (EKG or clinical, Troponin):   4.  CHF: No  5.  Dysrhythmia (new): No  6. Wound infection: No  7. Reperfusion symptoms: No  8. Return to OR: No  If yes: return to OR for (bleeding, neurologic, other CEA incision, other):   Discharge  medications: Statin use:  Yes ASA use:  Yes   Beta blocker use:  No ACE-Inhibitor use:  Yes  ARB use:  No CCB use: No P2Y12 Antagonist use: Yes, [ x] Plavix, [ ]  Plasugrel, [ ]  Ticlopinine, [ ]  Ticagrelor, [ ]  Other, [ ]  No for medical reason, [ ]  Non-compliant, [ ]  Not-indicated Anti-coagulant use:  No, [ ]  Warfarin, [ ]  Rivaroxaban, [ ]  Dabigatran,

## 2023-02-27 ENCOUNTER — Encounter (HOSPITAL_COMMUNITY): Payer: Self-pay | Admitting: Surgery

## 2023-03-01 ENCOUNTER — Other Ambulatory Visit: Payer: Self-pay

## 2023-03-01 DIAGNOSIS — Z794 Long term (current) use of insulin: Secondary | ICD-10-CM

## 2023-03-01 MED ORDER — FREESTYLE LIBRE 3 PLUS SENSOR MISC: 3 refills | Status: DC

## 2023-03-05 ENCOUNTER — Other Ambulatory Visit: Payer: Self-pay | Admitting: Student

## 2023-03-05 DIAGNOSIS — K219 Gastro-esophageal reflux disease without esophagitis: Secondary | ICD-10-CM

## 2023-03-12 ENCOUNTER — Ambulatory Visit (INDEPENDENT_AMBULATORY_CARE_PROVIDER_SITE_OTHER): Payer: Medicare (Managed Care) | Admitting: Physician Assistant

## 2023-03-12 VITALS — BP 162/82 | HR 74 | Temp 97.2°F | Ht 73.0 in | Wt 188.2 lb

## 2023-03-12 DIAGNOSIS — I6523 Occlusion and stenosis of bilateral carotid arteries: Secondary | ICD-10-CM

## 2023-03-12 MED ORDER — OXYCODONE HCL 5 MG PO TABS: 5.0000 mg | ORAL_TABLET | Freq: Four times a day (QID) | ORAL | 0 refills | Status: DC | PRN

## 2023-03-12 NOTE — Progress Notes (Unsigned)
POST OPERATIVE OFFICE NOTE    CC:  F/u for surgery  HPI:  This is a 72 y.o. male who is s/p *** on *** by Dr. Marland Kitchen    Pt returns today for follow up.  Pt states ***   Allergies  Allergen Reactions   Sulfonamide Derivatives Hives    Current Outpatient Medications  Medication Sig Dispense Refill   oxyCODONE (OXY IR/ROXICODONE) 5 MG immediate release tablet Take 1 tablet (5 mg total) by mouth every 6 (six) hours as needed for severe pain (pain score 7-10) or moderate pain (pain score 4-6). 10 tablet 0   aspirin EC 81 MG tablet Take 1 tablet (81 mg total) by mouth daily at 6 (six) AM. Swallow whole. (Patient not taking: Reported on 03/12/2023)     atorvastatin (LIPITOR) 40 MG tablet TAKE 1 TABLET(40 MG) BY MOUTH DAILY 90 tablet 1   bictegravir-emtricitabine-tenofovir AF (BIKTARVY) 50-200-25 MG TABS tablet Take 1 tablet by mouth daily. 30 tablet 11   Blood Glucose Monitoring Suppl (FREESTYLE LITE) w/Device KIT 1 Device by Does not apply route daily in the afternoon. 1 kit 0   cilostazol (PLETAL) 50 MG tablet TAKE 1 TABLET(50 MG) BY MOUTH TWICE DAILY 60 tablet 3   clopidogrel (PLAVIX) 75 MG tablet Take 1 tablet (75 mg total) by mouth daily. 90 tablet 3   Continuous Glucose Receiver (FREESTYLE LIBRE 3 READER) DEVI 1 each by Does not apply route as directed. Use  with freestyle libre 3 sensors to monitor blood sugar continuously 1 each 0   Continuous Glucose Sensor (FREESTYLE LIBRE 3 PLUS SENSOR) MISC Change sensor every 15 days. 6 each 3   Continuous Glucose Sensor (FREESTYLE LIBRE 3 SENSOR) MISC Place 1 sensor on the skin every 14 days. Use to check glucose continuously 6 each 3   empagliflozin (JARDIANCE) 25 MG TABS tablet Take 1 tablet (25 mg total) by mouth daily before breakfast. 90 tablet 3   ezetimibe (ZETIA) 10 MG tablet Take 1 tablet (10 mg total) by mouth daily. 90 tablet 2   glucose blood (CONTOUR NEXT TEST) test strip USE TO CHECK BLOOD SUGAR FIVE TIMES DAILY 400 strip 3    glucose blood (FREESTYLE LITE) test strip Use 5 times daily to check blood sugar. 450 each 3   glucose blood (FREESTYLE LITE) test strip 1 each by Other route 3 (three) times daily. Use as instructed 300 each 3   glucose blood (FREESTYLE LITE) test strip Use as instructed 100 each 12   HUMALOG 100 UNIT/ML injection Max daily 110 units per CeQur 110 mL 3   ibuprofen (ADVIL) 200 MG tablet Take 600 mg by mouth every 6 (six) hours as needed for moderate pain.     injection device for insulin (CEQUR SIMPLICITY 2U) DEVI 1 Device by Other route once for 1 dose. 45 each 3   injection device for insulin (CEQUR SIMPLICITY 2U) DEVI change every 2 days 45 each 3   Injection Device for Insulin (CEQUR SIMPLICITY INSERTER) MISC Use to Apply a new cecur simplicity patch as directed every 2 days 1 each 1   insulin glargine, 2 Unit Dial, (TOUJEO MAX SOLOSTAR) 300 UNIT/ML Solostar Pen Inject 60 Units into the skin in the morning. 18 mL 3   Insulin Pen Needle (BD PEN NEEDLE NANO U/F) 32G X 4 MM MISC 1 Device by Other route daily in the afternoon. USE AS DIRECTED FOUR TIMES DAILY. Dx code  E11.42 100 each 3   Insulin Syringe-Needle U-100 (GNP  INSULIN SYRINGES) 30G X 5/16" 1 ML MISC 1 Device by Does not apply route daily in the afternoon. 100 each 3   Lancet Devices (BAYER MICROLET 2 LANCING DEVIC) MISC Use to check blood sugar up to 3 ties a day 1 each 2   Lancets (FREESTYLE) lancets Use 5 times daily to check blood sugar. 450 each 3   lipase/protease/amylase (CREON) 36000 UNITS CPEP capsule Take 1 capsule (36,000 Units total) by mouth 3 (three) times daily before meals. (Patient taking differently: Take 36,000 Units by mouth See admin instructions. Take 36,000 units with each meal and snack) 180 capsule 3   lisinopril (ZESTRIL) 40 MG tablet TAKE 1 TABLET BY MOUTH DAILY. HOLD IF SYSTOLIC BLOOD PRESSURE IS LESS THAN 110 90 tablet 3   omeprazole (PRILOSEC) 40 MG capsule TAKE 1 CAPSULE(40 MG) BY MOUTH DAILY 90 capsule 1    pioglitazone (ACTOS) 30 MG tablet Take 1 tablet (30 mg total) by mouth daily. 90 tablet 3   Semaglutide, 2 MG/DOSE, (OZEMPIC, 2 MG/DOSE,) 8 MG/3ML SOPN 2 mg by Other route once a week. 9 mL 3   No current facility-administered medications for this visit.     ROS:  See HPI  Physical Exam:   Incision:  *** Extremities:  *** Neuro: *** Abdomen:  ***    Assessment/Plan:  This is a 72 y.o. male who is s/p: ***  -***   Loel Dubonnet, PA-C [facility] [phone]   Clinic MD:  Myra Gianotti

## 2023-03-13 ENCOUNTER — Encounter (HOSPITAL_COMMUNITY): Payer: Self-pay | Admitting: Surgery

## 2023-03-22 ENCOUNTER — Other Ambulatory Visit: Payer: Self-pay | Admitting: Internal Medicine

## 2023-03-22 DIAGNOSIS — I739 Peripheral vascular disease, unspecified: Secondary | ICD-10-CM

## 2023-03-29 ENCOUNTER — Other Ambulatory Visit: Payer: Self-pay

## 2023-03-29 DIAGNOSIS — I6523 Occlusion and stenosis of bilateral carotid arteries: Secondary | ICD-10-CM

## 2023-04-11 ENCOUNTER — Ambulatory Visit (INDEPENDENT_AMBULATORY_CARE_PROVIDER_SITE_OTHER): Payer: Medicare (Managed Care) | Admitting: Dietician

## 2023-04-11 ENCOUNTER — Encounter: Payer: Self-pay | Admitting: Dietician

## 2023-04-11 ENCOUNTER — Other Ambulatory Visit: Payer: Self-pay | Admitting: Dietician

## 2023-04-11 VITALS — Wt 189.7 lb

## 2023-04-11 DIAGNOSIS — Z794 Long term (current) use of insulin: Secondary | ICD-10-CM

## 2023-04-11 DIAGNOSIS — E1151 Type 2 diabetes mellitus with diabetic peripheral angiopathy without gangrene: Secondary | ICD-10-CM

## 2023-04-11 LAB — HM DIABETES EYE EXAM

## 2023-04-11 MED ORDER — FREESTYLE LITE TEST VI STRP
ORAL_STRIP | 3 refills | Status: AC
Start: 2023-04-11 — End: ?

## 2023-04-11 NOTE — Telephone Encounter (Signed)
Needs more strips as he would like to chek blood sugar more frequently due to multiple diabetes medications and insulin administration times. He would also like a referral to podiatry due to neuropathy and prior amputation( no pulses per endocrine)  cannot see feet well or cut nails.

## 2023-04-11 NOTE — Patient Instructions (Addendum)
Thank you for your visit today!  You can wear the Cecur Simplicity insulin patch for up to 4 days. (If you have insulin left in your Cecur insulin patch after 3 days)  We did an eye exam today- we'll call you if you need to see an eye doctor - otherwise we'll mail you a copy of he report from [facility].  Let's follow up in 6 months- I will schedule and let you know when in May/ June of 2025  Lupita Leash [phone]

## 2023-04-11 NOTE — Progress Notes (Unsigned)
BP Readings from Last 3 Encounters:  03/12/23 (!) 162/82  02/23/23 (!) 127/56  02/14/23 (!) 147/68   Wt Readings from Last 10 Encounters:  04/11/23 189 lb 11.2 oz (86 kg)  03/12/23 188 lb 3.2 oz (85.4 kg)  02/22/23 190 lb (86.2 kg)  02/14/23 188 lb 1.6 oz (85.3 kg)  02/12/23 187 lb (84.8 kg)  01/22/23 185 lb (83.9 kg)  01/10/23 188 lb 12.8 oz (85.6 kg)  01/01/23 188 lb (85.3 kg)  09/26/22 183 lb 6.4 oz (83.2 kg)  08/10/22 183 lb (83 kg)   Lab Results  Component Value Date   HGBA1C 8.2 (A) 01/01/2023   HGBA1C 8.7 (A) 09/26/2022   HGBA1C 9.8 (A) 08/03/2022   HGBA1C 9.2 (A) 03/28/2022   HGBA1C 7.2 (A) 08/29/2021   Diabetes Self-Management Education  Visit Type: Annual Follow-Up  Appt. Start Time: 1015 Appt. End Time: 1115  04/11/2023  Nathan [name], identified by name and date of birth, is a 72 y.o. male with a diagnosis of Diabetes:  .   ASSESSMENT  Weight 189 lb 11.2 oz (86 kg). Body mass index is 25.03 kg/m.  Retinal images were done today as part of this patient's yearly diabetes health maintenance program per request of patient. They were transmitted electronically to an ophthalmologist who will interpret them and send a report back with their findings. This was explained to the patient and they were also informed that the results would be communicated to them either by phone,  mail or both.   [name] states that he had carotid artery surgery in October. He asks for freestyle libre 3 reader and more Strips for  5x/day testing as he is currently only using SMBG and not CGM due to not having a reader Hold off on changing to Dexcom G7  until we find out about the freestyle liber 3 reader Prior authorization.) Change to dexcom G7 sensors and receiver) Called pharmacy about freestyle libre 3 reader- they should be sending [place] a PA to complete Will wait for endo appointment to get A1c Thinking about taking covid vaccine He would like a Podiatry referral  Diabetes  Self-Management Education - 04/11/23 1100       Visit Information   Visit Type Annual Follow-Up      Health Coping   How would you rate your overall health? Fair      Psychosocial Assessment   Patient Belief/Attitude about Diabetes Motivated to manage diabetes   gets down about his diabetes sometimes, but not interested in support group   What is the hardest part about your diabetes right now, causing you the most concern, or is the most worrisome to you about your diabetes?   Making healty food and beverage choices    Self-care barriers Unsteady gait/risk for falls;Lack of material resources    Self-management support Friends;CDE visits    Patient Concerns Support    Special Needs None    Preferred Learning Style No preference indicated;Hands on    Learning Readiness Contemplating    How often do you need to have someone help you when you read instructions, pamphlets, or other written materials from your doctor or pharmacy? 2 - Rarely    What is the last grade level you completed in school? 12      Pre-Education Assessment   Patient understands the diabetes disease and treatment process. Comprehends key points    Patient understands incorporating nutritional management into lifestyle. Comprehends key points    Patient undertands incorporating  physical activity into lifestyle. Comprehends key points    Patient understands using medications safely. Comprehends key points    Patient understands monitoring blood glucose, interpreting and using results Comprehends key points    Patient understands prevention, detection, and treatment of acute complications. Needs Review    Patient understands prevention, detection, and treatment of chronic complications. Needs Review    Patient understands how to develop strategies to address psychosocial issues. Comprehends key points    Patient understands how to develop strategies to promote health/change behavior. Comprehends key points       Complications   Last HgB A1C per patient/outside source 8.2 %    How often do you check your blood sugar? > 4 times/day    Fasting Blood glucose range (mg/dL) 32-440;102-725    Postprandial Blood glucose range (mg/dL) 366-440;>347    Number of hypoglycemic episodes per month 0    Number of hyperglycemic episodes ( >200mg /dL): Daily    Can you tell when your blood sugar is high? No    Have you had a dilated eye exam in the past 12 months? No   retinal images done today   Have you had a dental exam in the past 12 months? No    Are you checking your feet? Yes    How many days per week are you checking your feet? 7   he states he cannot see the bottom of his foot and he has trouble cutting toenails, cutting his foot often, rec podiatry referral     Activity / Exercise   Activity / Exercise Type ADL's;Light (walking / raking leaves)      Patient Education   Previous Diabetes Education Yes (please comment)   here   Acute complications Educated on sick day management    Chronic complications Applicable immunizations;Retinopathy and reason for yearly dilated eye exams;Assessed and discussed foot care and prevention of foot problems      Individualized Goals (developed by patient)   Reducing Risk do foot checks daily      Patient Self-Evaluation of Goals - Patient rates self as meeting previously set goals (% of time)   Reducing Risk (treating acute and chronic complications) >75% (most of the time)      Post-Education Assessment   Patient understands prevention, detection, and treatment of acute complications. Comprehends key points    Patient understands prevention, detection, and treatment of chronic complications. Comprehends key points      Outcomes   Expected Outcomes Demonstrated interest in learning. Expect positive outcomes   needs to see podatry but 25$ copay is limiting him going. He has Medicaid family planning that will not help with that copay. is considering copay at endocrine  office   Future DMSE 6 months    Program Status Completed      Subsequent Visit   Since your last visit have you continued or begun to take your medications as prescribed? Yes    Since your last visit have you had your blood pressure checked? Yes    Is your most recent blood pressure lower, unchanged, or higher since your last visit? Higher    Since your last visit have you experienced any weight changes? Gain    Weight Gain (lbs) 4    Since your last visit, are you checking your blood glucose at least once a day? Yes             Individualized Plan for Diabetes Self-Management Training:   Learning Objective:  Patient will  have a greater understanding of diabetes self-management. Patient education plan is to attend individual and/or group sessions per assessed needs and concerns.   Plan:   Patient Instructions  Thank you for your visit today!  You can wear the Cecur Simplicity insulin patch for up to 4 days. (If you have insulin left in your Cecur insulin patch after 3 days)  We did an eye exam today- we'll call you if you need to see an eye doctor - otherwise we'll mail you a copy of he report from [facility].  Let's follow up in 6 months- I will schedule and let you know when in May/ June of 2025  Lupita Leash [phone]   Expected Outcomes:  Demonstrated interest in learning. Expect positive outcomes (needs to see podatry but 25$ copay is limiting him going. He has Medicaid family planning that will not help with that copay. is considering copay at endocrine office)  Education material provided: Diabetes Resources  If problems or questions, patient to contact team via:  Phone  Future DSME appointment: 6 months Norm Parcel, RD 04/11/2023 12:13 PM.

## 2023-04-12 ENCOUNTER — Telehealth: Payer: Self-pay | Admitting: Dietician

## 2023-04-12 NOTE — Telephone Encounter (Signed)
Called patient and left message for a return call and to let him know his retinal images were "low quality and inadequate for interpretation" per Hulan Fray, MD at [facility].  Her recommendation "please resubmit an image of higher quality or refer to an ophthalmologist for a comprehensive eye exam." Left patient a message recommending her go to an eye doctor because image quality per camera was 85/86 out of 100, so do not think resubmitting higher quality images will be  possible without dilation. He was offered a referral. He was also informed he can make his own appointment and have them fax [place] the results

## 2023-04-17 NOTE — Telephone Encounter (Signed)
Nathan [name] says he does not want an ophthalmology referral at this time.  He is going to call Rosann Auerbach again about getting their approval for a CGM reader. ([facility] called him and said it is too early to fill his reader per [place])

## 2023-04-19 ENCOUNTER — Ambulatory Visit: Payer: Medicare (Managed Care) | Admitting: Podiatry

## 2023-05-02 ENCOUNTER — Other Ambulatory Visit: Payer: Self-pay | Admitting: Internal Medicine

## 2023-05-03 NOTE — Telephone Encounter (Signed)
Jardiance refill request complete

## 2023-05-04 ENCOUNTER — Other Ambulatory Visit: Payer: Self-pay | Admitting: Internal Medicine

## 2023-05-04 DIAGNOSIS — I739 Peripheral vascular disease, unspecified: Secondary | ICD-10-CM

## 2023-05-05 ENCOUNTER — Other Ambulatory Visit: Payer: Self-pay | Admitting: Internal Medicine

## 2023-05-05 DIAGNOSIS — E1169 Type 2 diabetes mellitus with other specified complication: Secondary | ICD-10-CM

## 2023-05-14 NOTE — Telephone Encounter (Signed)
 error

## 2023-05-17 ENCOUNTER — Other Ambulatory Visit: Payer: Self-pay | Admitting: *Deleted

## 2023-05-17 DIAGNOSIS — Z794 Long term (current) use of insulin: Secondary | ICD-10-CM

## 2023-05-21 NOTE — Telephone Encounter (Signed)
 Call to follow up on CGM reader and offered a second try at retinal images.

## 2023-05-24 ENCOUNTER — Encounter: Payer: Self-pay | Admitting: Internal Medicine

## 2023-05-25 ENCOUNTER — Encounter: Payer: Self-pay | Admitting: Internal Medicine

## 2023-05-25 ENCOUNTER — Other Ambulatory Visit: Payer: Self-pay

## 2023-05-25 ENCOUNTER — Ambulatory Visit (INDEPENDENT_AMBULATORY_CARE_PROVIDER_SITE_OTHER): Payer: Medicare (Managed Care) | Admitting: Internal Medicine

## 2023-05-25 VITALS — BP 110/72 | HR 85 | Ht 73.0 in | Wt 185.4 lb

## 2023-05-25 DIAGNOSIS — Z794 Long term (current) use of insulin: Secondary | ICD-10-CM

## 2023-05-25 DIAGNOSIS — E785 Hyperlipidemia, unspecified: Secondary | ICD-10-CM | POA: Diagnosis not present

## 2023-05-25 DIAGNOSIS — E1151 Type 2 diabetes mellitus with diabetic peripheral angiopathy without gangrene: Secondary | ICD-10-CM | POA: Diagnosis not present

## 2023-05-25 DIAGNOSIS — E1169 Type 2 diabetes mellitus with other specified complication: Secondary | ICD-10-CM

## 2023-05-25 DIAGNOSIS — E1142 Type 2 diabetes mellitus with diabetic polyneuropathy: Secondary | ICD-10-CM | POA: Diagnosis not present

## 2023-05-25 LAB — POCT GLYCOSYLATED HEMOGLOBIN (HGB A1C): Hemoglobin A1C: 8.3 % — AB (ref 4.0–5.6)

## 2023-05-25 MED ORDER — PIOGLITAZONE HCL 30 MG PO TABS: 30.0000 mg | ORAL_TABLET | Freq: Every day | ORAL | 3 refills | Status: DC

## 2023-05-25 MED ORDER — EMPAGLIFLOZIN 25 MG PO TABS: 25.0000 mg | ORAL_TABLET | Freq: Every day | ORAL | 3 refills | Status: DC

## 2023-05-25 MED ORDER — FREESTYLE LIBRE 3 PLUS SENSOR MISC: 3 refills | Status: DC

## 2023-05-25 MED ORDER — HUMALOG 100 UNIT/ML IJ SOLN: INTRAMUSCULAR | 3 refills | Status: DC

## 2023-05-25 MED ORDER — BD PEN NEEDLE NANO U/F 32G X 4 MM MISC: 1.0000 | Freq: Every day | 3 refills | Status: DC

## 2023-05-25 MED ORDER — TIRZEPATIDE 5 MG/0.5ML ~~LOC~~ SOAJ: 5.0000 mg | SUBCUTANEOUS | 3 refills | Status: DC

## 2023-05-25 MED ORDER — TOUJEO MAX SOLOSTAR 300 UNIT/ML ~~LOC~~ SOPN
56.0000 [IU] | PEN_INJECTOR | Freq: Every morning | SUBCUTANEOUS | 4 refills | Status: DC
Start: 2023-05-25 — End: 2023-08-29

## 2023-05-25 NOTE — Progress Notes (Signed)
Name: Nathan [name]  MRN/ DOB: [id number], 10-29-1950   Age/ Sex: 73 y.o., male    PCP: Olegario Messier, MD   Reason for Endocrinology Evaluation: Type 2 Diabetes Mellitus     Date of Initial Endocrinology Visit: 09/09/2021    PATIENT IDENTIFIER: [name] is a 73 y.o. male with a past medical history of T2DM, HIV, HTN, GERD and Dyslipidemia. The patient presented for initial endocrinology clinic visit on 05/25/2023 for consultative assistance with his diabetes management.    HPI: Mr. Whatley was    Diagnosed with DM 2010 Prior Medications tried/Intolerance: Jardiance started 08/2021. Has metformin intolerance . Started Ozempic 2022          Hemoglobin A1c has ranged from 5.6% in 2019, peaking at 10.5% in 2022.   No prior DKA    On his initial visit to our clinic he had an A1c of 7.2%, we increased Jardiance, continued Ozempic, adjusted MDI regimen  He was restarted on pioglitazone through his PCPs office 07/2022 SUBJECTIVE:   During the last visit (01/01/2023): A1c 8.2%    Today (05/25/23): Nathan [name] is here for a follow up on diabetes management . He checks his blood sugars 1-3 times daily. The patient has had hypoglycemic episodes since the last clinic visit. He is not symptomatic   Continues to follow-up with infectious disease for HIV management He continues with chronic diarrhea, Creon did not help   He is s/p left  carotid endarterectomy 02/2023 Denies nausea or vomiting  Denies chest pain or dizziness   He is running low on Cequr [facility] not [facility]   HOME DIABETES REGIMEN: Pioglitazone 30 mg daily Jardiance 25 mg daily  Toujeo 60  units daily  HUmalog 24 units with meal  - 13 clicks with each meal  Ozempic 2 mg weekly  CeQur    Statin: yes ACE-I/ARB: yes   METER DOWNLOAD SUMMARY: 11/19-1/17/2025 Fingerstick Blood Glucose Tests = 126 Average Number Tests/Day = 2.1 Overall Mean FS Glucose = 198  BG Ranges: Low = 66 High =  387  BG Target % Results: % In target = 43 % Over target = 29 % Under target = 2  Hypoglycemic Events/30 Days: BG < 50 = 0 Episodes of symptomatic severe hypoglycemia = 0   DIABETIC COMPLICATIONS: Microvascular complications:  S/P right Above knee amputations, neuropathy Denies: CKD , retinopathy Last eye exam: Completed 04/04/2022  Macrovascular complications:  PVD Denies: CAD, CVA   PAST HISTORY: Past Medical History:  Past Medical History:  Diagnosis Date   Asthma    per 2003 [facility] pulm records [facility]    Blood dyscrasia    HIV   Clotting disorder (HCC)    Colon polyps    noted previous colonoscopy [facility]   DDD (degenerative disc disease)    cervical spine   Depression    Diabetes mellitus    dx 2010   Diarrhea 02/24/2021   Dizziness 08/25/2021   Falls 08/25/2021   Fever    unknwon origin   GERD (gastroesophageal reflux disease)    Head swelling 05/23/2017   Hep C w/o coma, chronic (HCC)    History of syphilis    noted [facility] records   HIV infection (HCC)    undetectable viral load and CD4 ct 667 as of 11/2011   Hyperlipidemia    Hypertension    MRSA (methicillin resistant Staphylococcus aureus)    Multiple sclerosis (HCC)    in remission as of 11/2011 (diagnosed  late 1980s)   Pain in limb-Right Leg 10/13/2013   Pancreas (digestive gland) works poorly    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HCC)    2002   Pneumonia    Prosthesis fitting 03/10/2019   PVD (peripheral vascular disease) (HCC)    Left Stent 07/02/2008   Scalp lesion 07/26/2017   UTI (urinary tract infection)    Wears dentures    Past Surgical History:  Past Surgical History:  Procedure Laterality Date   ABDOMINAL AORTOGRAM W/LOWER EXTREMITY N/A 08/07/2017   Procedure: ABDOMINAL AORTOGRAM W/LOWER EXTREMITY;  Surgeon: Nada Libman, MD;  Location: [facility] INVASIVE [facility];  Service: Cardiovascular;  Laterality: N/A;   ABOVE KNEE LEG AMPUTATION  2009   Right Leg   COLONOSCOPY W/ BIOPSIES  AND POLYPECTOMY     ENDARTERECTOMY Left 02/22/2023   Procedure: LEFT ENDARTERECTOMY CAROTID;  Surgeon: Nada Libman, MD;  Location: [facility] OR;  Service: Vascular;  Laterality: Left;   LOWER EXTREMITY ANGIOGRAM Left 12/26/2011   Procedure: LOWER EXTREMITY ANGIOGRAM;  Surgeon: Nada Libman, MD;  Location: [facility] CATH LAB;  Service: Cardiovascular;  Laterality: Left;   LOWER EXTREMITY ANGIOGRAM Left 08/17/2017   Procedure: LOWER EXTREMITY ANGIOGRAM LEFT LEG WITH RUNOFF AND Stenting.;  Surgeon: Nada Libman, MD;  Location: [facility] OR;  Service: Vascular;  Laterality: Left;   MULTIPLE TOOTH EXTRACTIONS     OTHER SURGICAL HISTORY     left left with stents (Dr. Osie Cheeks)   OTHER SURGICAL HISTORY     2003 colonoscopy 5 mm polyp transverse colon; (2)35mm  polyps in rectum-hyperplastic   PATCH ANGIOPLASTY Left 02/22/2023   Procedure: PATCH ANGIOPLASTY 1CMx14CM Kathleen Lime;  Surgeon: Nada Libman, MD;  Location: [facility] OR;  Service: Vascular;  Laterality: Left;   PERIPHERAL VASCULAR INTERVENTION Left 08/17/2017   Procedure: POPLITEAL STENT;  Surgeon: Nada Libman, MD;  Location: [facility] OR;  Service: Vascular;  Laterality: Left;    Social History:  reports that he quit smoking about 58 years ago. His smoking use included cigarettes. He has never been exposed to tobacco smoke. He quit smokeless tobacco use about 45 years ago.  His smokeless tobacco use included chew. He reports that he does not drink alcohol and does not use drugs. Family History:  Family History  Problem Relation Age of Onset   Diabetes Mother    Coronary artery disease Mother    Coronary artery disease Father    Liver disease Sister    Cancer Brother        colon caner stage 4 as of 11/2011 (unknown age of onset)   Prostate cancer Brother    Colon cancer Brother    Rectal cancer Neg Hx    Esophageal cancer Neg Hx      HOME MEDICATIONS: Allergies as of 05/25/2023       Reactions   Sulfonamide Derivatives Hives         Medication List        Accurate as of May 25, 2023  9:18 AM. If you have any questions, ask your nurse or doctor.          aspirin EC 81 MG tablet Take 1 tablet (81 mg total) by mouth daily at 6 (six) AM. Swallow whole.   atorvastatin 40 MG tablet Commonly known as: LIPITOR TAKE 1 TABLET(40 MG) BY MOUTH DAILY   Bayer Microlet 2 Lancing Devic Misc Use to check blood sugar up to 3 ties a day   BD Pen Needle Nano U/F 32G  X 4 MM Misc Generic drug: Insulin Pen Needle 1 Device by Other route daily in the afternoon. USE AS DIRECTED FOUR TIMES DAILY. Dx code  E11.42   Biktarvy 50-200-25 MG Tabs tablet Generic drug: bictegravir-emtricitabine-tenofovir AF Take 1 tablet by mouth daily.   CeQur Simplicity Inserter Misc Use to Apply a new cecur simplicity patch as directed every 2 days   CeQur Simplicity 2U Devi Generic drug: injection device for insulin 1 Device by Other route once for 1 dose.   CeQur Simplicity 2U Devi Generic drug: injection device for insulin change every 2 days   cilostazol 50 MG tablet Commonly known as: PLETAL TAKE 1 TABLET(50 MG) BY MOUTH TWICE DAILY   clopidogrel 75 MG tablet Commonly known as: PLAVIX TAKE 1 TABLET(75 MG) BY MOUTH DAILY   ezetimibe 10 MG tablet Commonly known as: ZETIA TAKE 1 TABLET(10 MG) BY MOUTH DAILY   freestyle lancets Use 5 times daily to check blood sugar.   FreeStyle Libre 3 Reader Lyons 1 each by Does not apply route as directed. Use  with freestyle libre 3 sensors to monitor blood sugar continuously   FreeStyle Libre 3 Sensor Misc Place 1 sensor on the skin every 14 days. Use to check glucose continuously   FreeStyle Libre 3 Plus Sensor Misc Change sensor every 15 days.   FREESTYLE LITE test strip Generic drug: glucose blood Use as instructed   FREESTYLE LITE test strip Generic drug: glucose blood Use 5 times daily to check blood sugar.   FreeStyle Lite w/Device Kit 1 Device by Does not apply  route daily in the afternoon.   HumaLOG 100 UNIT/ML injection Generic drug: insulin lispro Max daily 110 units per CeQur   ibuprofen 200 MG tablet Commonly known as: ADVIL Take 600 mg by mouth every 6 (six) hours as needed for moderate pain.   Insulin Syringe-Needle U-100 30G X 5/16" 1 ML Misc Commonly known as: GNP Insulin Syringes 1 Device by Does not apply route daily in the afternoon.   Jardiance 25 MG Tabs tablet Generic drug: empagliflozin TAKE 1 TABLET(25 MG) BY MOUTH DAILY BEFORE BREAKFAST   lipase/protease/amylase 45409 UNITS Cpep capsule Commonly known as: Creon Take 1 capsule (36,000 Units total) by mouth 3 (three) times daily before meals. What changed:  when to take this additional instructions   lisinopril 40 MG tablet Commonly known as: ZESTRIL TAKE 1 TABLET BY MOUTH DAILY. HOLD IF SYSTOLIC BLOOD PRESSURE IS LESS THAN 110   omeprazole 40 MG capsule Commonly known as: PRILOSEC TAKE 1 CAPSULE(40 MG) BY MOUTH DAILY   oxyCODONE 5 MG immediate release tablet Commonly known as: Oxy IR/ROXICODONE Take 1 tablet (5 mg total) by mouth every 6 (six) hours as needed for severe pain (pain score 7-10) or moderate pain (pain score 4-6).   Ozempic (2 MG/DOSE) 8 MG/3ML Sopn Generic drug: Semaglutide (2 MG/DOSE) 2 mg by Other route once a week.   pioglitazone 30 MG tablet Commonly known as: ACTOS Take 1 tablet (30 mg total) by mouth daily.   Toujeo Max SoloStar 300 UNIT/ML Solostar Pen Generic drug: insulin glargine (2 Unit Dial) Inject 60 Units into the skin in the morning.         ALLERGIES: Allergies  Allergen Reactions   Sulfonamide Derivatives Hives     REVIEW OF SYSTEMS: A comprehensive ROS was conducted with the patient and is negative except as per HPI    OBJECTIVE:   VITAL SIGNS: BP 110/72 (BP Location: Right Arm, Patient Position: Sitting,  Cuff Size: Small)   Pulse 85   Ht 6\' 1"  (1.854 m)   Wt 185 lb 6.4 oz (84.1 kg)   SpO2 95%   BMI 24.46  kg/m    PHYSICAL EXAM:  General: Pt appears well and is in NAD  Lungs: Clear with good BS bilat   Heart: RRR   Neuro: MS is good with appropriate affect, pt is alert and Ox3   DM Foot Exam 09/26/2022 RBKA Left foot : No pulses  Sensation absent      DATA REVIEWED:  Lab Results  Component Value Date   HGBA1C 8.3 (A) 05/25/2023   HGBA1C 8.2 (A) 01/01/2023   HGBA1C 8.7 (A) 09/26/2022    Latest Reference Range & Units 02/23/23 05:30  Sodium 135 - 145 mmol/L 138  Potassium 3.5 - 5.1 mmol/L 3.5  Chloride 98 - 111 mmol/L 104  CO2 22 - 32 mmol/L 23  Glucose 70 - 99 mg/dL 010 (H)  BUN 8 - 23 mg/dL 23  Creatinine 2.72 - 5.36 mg/dL 6.44  Calcium 8.9 - 03.4 mg/dL 9.3  Anion gap 5 - 15  11  GFR, Estimated >60 mL/min >60    Latest Reference Range & Units 02/23/23 05:30  Total CHOL/HDL Ratio RATIO 3.0  Cholesterol 0 - 200 mg/dL 89  HDL Cholesterol >74 mg/dL 30 (L)  LDL (calc) 0 - 99 mg/dL 36  Triglycerides <259 mg/dL 563  VLDL 0 - 40 mg/dL 23     ASSESSMENT / PLAN / RECOMMENDATIONS:   1) Type 2 Diabetes Mellitus, Poorly controlled, With Neuropathic and  macrovascular complications - Most recent A1c of 8.3 %. Goal A1c < 7.0 %.    - A1c continues to be above goal -He continues to use the CeQur, but he is having difficulty obtaining it through [employer], the patient tells me that his insurance has a preferred pharmacy, he will contact us with the name of the pharmacy so we are able to send his prescription of CeQur -Patient opted to avoid insulin pump technology as he endorsed learning difficulty -Patient has chronic diarrhea but he does not attribute Ozempic to causing any worsening symptoms.  I have recommended switching Ozempic to [facility] -I have also increased his Humalog with each meal -I will decrease Toujeo as he has been noted with hypoglycemia in the morning   MEDICATIONS: Stop Ozempic Start Mounjaro 5 mg weekly Continue pioglitazone 30 mg daily Continue  Jardiance 25 mg, 1 tablet daily Decrease Toujeo 56 units daily Increase Humalog 30 units with each meal  (30 units ) Continue correction factor : Humalog ( BG-130/25) TIDQAC  EDUCATION / INSTRUCTIONS: BG monitoring instructions: Patient is instructed to check his blood sugars 3 times a day, before meals. Call [facility] if: BG persistently < 70  I reviewed the Rule of 15 for the treatment of hypoglycemia in detail with the patient. Literature supplied.   2) Diabetic complications:  Eye: Does not have known diabetic retinopathy.  Neuro/ Feet: Does  have known diabetic peripheral neuropathy. Renal: Patient does not have known baseline CKD. He is  on an ACEI/ARB at present.  3) Dyslipidemia :   -LDL and TG remain at goal  Medication  Continue atorvastatin 40 mg daily    Follow-up in 3 months    Signed electronically by: Lyndle Herrlich, MD  [facility] End[facility]  [facility] [address] [place] [place], [place] [address] Phone: [phone] FAX: [phone]   CC: Olegario Messier, MD  [address] [place] [address] Phone: [phone]  Fax: [phone]    Return to Endocrinology clinic as below: Future Appointments  Date Time Provider Department Center  06/11/2023  9:30 AM [facility] 4 [facility]  06/11/2023 10:15 AM [facility]2 [facility]

## 2023-05-25 NOTE — Patient Instructions (Addendum)
Stop Ozempic  Start Mounjaro 5 mg weekly  Continue Pioglitazone 30 mg daily  Continue Jardiance 25 mg, 1 tablet every morning  daily  Decrease 56  units every morning  Change HUmalog 15 clicks with each meal  Humalog correctional insulin: ADD extra units on insulin to your meal-time Humalog dose if your blood sugars are higher than 155. Use the scale below to help guide you:   Blood sugar before meal Number of units to inject  Less than 155 0 unit  156 -  180 1 units  181 -  205 2 units  206 -  230 3 units  231 -  255 4 units  256 -  280 5 units  281 -  305 6 units  306 -  330 7 units  331 -  355 8 units  356 - 380 9 units       HOW TO TREAT LOW BLOOD SUGARS (Blood sugar LESS THAN 70 MG/DL) Please follow the RULE OF 15 for the treatment of hypoglycemia treatment (when your (blood sugars are less than 70 mg/dL)   STEP 1: Take 15 grams of carbohydrates when your blood sugar is low, which includes:  3-4 GLUCOSE TABS  OR 3-4 OZ OF JUICE OR REGULAR SODA OR ONE TUBE OF GLUCOSE GEL    STEP 2: RECHECK blood sugar in 15 MINUTES STEP 3: If your blood sugar is still low at the 15 minute recheck --> then, go back to STEP 1 and treat AGAIN with another 15 grams of carbohydrates.

## 2023-05-30 ENCOUNTER — Other Ambulatory Visit: Payer: Medicare (Managed Care)

## 2023-05-30 ENCOUNTER — Other Ambulatory Visit (HOSPITAL_COMMUNITY)
Admission: RE | Admit: 2023-05-30 | Discharge: 2023-05-30 | Disposition: A | Discharge status: Home / Self Care | Payer: Medicare (Managed Care) | Source: Ambulatory Visit | Attending: Infectious Disease | Admitting: Infectious Disease

## 2023-05-30 ENCOUNTER — Other Ambulatory Visit: Payer: Self-pay

## 2023-05-30 DIAGNOSIS — Z113 Encounter for screening for infections with a predominantly sexual mode of transmission: Secondary | ICD-10-CM | POA: Diagnosis not present

## 2023-05-30 DIAGNOSIS — B20 Human immunodeficiency virus [HIV] disease: Secondary | ICD-10-CM

## 2023-05-31 LAB — URINE CYTOLOGY ANCILLARY ONLY
Chlamydia: NEGATIVE
Comment: NEGATIVE
Comment: NORMAL
Neisseria Gonorrhea: NEGATIVE

## 2023-05-31 LAB — T-HELPER CELL (CD4) - (RCID CLINIC ONLY)
CD4 % Helper T Cell: 20 % — ABNORMAL LOW (ref 33–65)
CD4 T Cell Abs: 650 /uL (ref 400–1790)

## 2023-06-01 LAB — COMPLETE METABOLIC PANEL WITH GFR
AG Ratio: 1.6 (calc) (ref 1.0–2.5)
ALT: 15 U/L (ref 9–46)
AST: 17 U/L (ref 10–35)
Albumin: 4.5 g/dL (ref 3.6–5.1)
Alkaline phosphatase (APISO): 67 U/L (ref 35–144)
BUN: 12 mg/dL (ref 7–25)
CO2: 26 mmol/L (ref 20–32)
Calcium: 9.8 mg/dL (ref 8.6–10.3)
Chloride: 104 mmol/L (ref 98–110)
Creat: 1.19 mg/dL (ref 0.70–1.28)
Globulin: 2.8 g/dL (ref 1.9–3.7)
Glucose, Bld: 131 mg/dL — ABNORMAL HIGH (ref 65–99)
Potassium: 4 mmol/L (ref 3.5–5.3)
Sodium: 139 mmol/L (ref 135–146)
Total Bilirubin: 0.5 mg/dL (ref 0.2–1.2)
Total Protein: 7.3 g/dL (ref 6.1–8.1)
eGFR: 64 mL/min/{1.73_m2} (ref >=60)

## 2023-06-01 LAB — CBC WITH DIFFERENTIAL/PLATELET
Absolute Lymphocytes: 3623 {cells}/uL (ref 850–3900)
Absolute Monocytes: 429 {cells}/uL (ref 200–950)
Basophils Absolute: 53 {cells}/uL (ref 0–200)
Basophils Relative: 0.8 %
Eosinophils Absolute: 251 {cells}/uL (ref 15–500)
Eosinophils Relative: 3.8 %
HCT: 45.8 % (ref 38.5–50.0)
Hemoglobin: 15.2 g/dL (ref 13.2–17.1)
MCH: 30.6 pg (ref 27.0–33.0)
MCHC: 33.2 g/dL (ref 32.0–36.0)
MCV: 92.3 fL (ref 80.0–100.0)
MPV: 9 fL (ref 7.5–12.5)
Monocytes Relative: 6.5 %
Neutro Abs: 2244 {cells}/uL (ref 1500–7800)
Neutrophils Relative %: 34 %
Platelets: 225 10*3/uL (ref 140–400)
RBC: 4.96 10*6/uL (ref 4.20–5.80)
RDW: 12.8 % (ref 11.0–15.0)
Total Lymphocyte: 54.9 %
WBC: 6.6 10*3/uL (ref 3.8–10.8)

## 2023-06-01 LAB — RPR: RPR Ser Ql: NONREACTIVE

## 2023-06-01 LAB — HIV-1 RNA QUANT-NO REFLEX-BLD
HIV 1 RNA Quant: <20 {copies}/mL — ABNORMAL HIGH
HIV-1 RNA Quant, Log: <1.3 {Log} — ABNORMAL HIGH

## 2023-06-03 ENCOUNTER — Other Ambulatory Visit: Payer: Self-pay | Admitting: Internal Medicine

## 2023-06-03 DIAGNOSIS — E7800 Pure hypercholesterolemia, unspecified: Secondary | ICD-10-CM

## 2023-06-03 DIAGNOSIS — E1169 Type 2 diabetes mellitus with other specified complication: Secondary | ICD-10-CM

## 2023-06-07 ENCOUNTER — Telehealth: Payer: Self-pay

## 2023-06-07 NOTE — Telephone Encounter (Signed)
Patient states that he received a letter stating insurance saying that they were filling 30 day supply of the Toujeo but its on the list of what they cover. Can we check into this to see if change needs to be made before patient is due

## 2023-06-08 ENCOUNTER — Telehealth: Payer: Self-pay

## 2023-06-08 ENCOUNTER — Other Ambulatory Visit (HOSPITAL_COMMUNITY): Payer: Self-pay

## 2023-06-08 NOTE — Telephone Encounter (Signed)
Pharmacy Patient Advocate Encounter   Received notification from Pt Calls Messages that prior authorization for Toujeo is required/requested.   Insurance verification completed.   The patient is insured through [employer] .   Per test claim: Refill too soon. PA is not needed at this time. Medication was filled 05/25/23. Next eligible fill date is 06/22/23.  From [employer]:    From what I can't tell, It looks like they will cover brand name, instead of generic. At least tat is how I read the letter. Unfortunately I can't run a test claim to know for sure since he just picked up.

## 2023-06-11 ENCOUNTER — Ambulatory Visit (INDEPENDENT_AMBULATORY_CARE_PROVIDER_SITE_OTHER): Payer: Medicare (Managed Care) | Admitting: Physician Assistant

## 2023-06-11 ENCOUNTER — Ambulatory Visit (HOSPITAL_COMMUNITY)
Admission: RE | Admit: 2023-06-11 | Discharge: 2023-06-11 | Disposition: A | Discharge status: Home / Self Care | Payer: Medicare (Managed Care) | Source: Ambulatory Visit | Attending: Surgery | Admitting: Surgery

## 2023-06-11 VITALS — BP 145/74 | HR 64 | Temp 97.8°F | Resp 18 | Ht 73.0 in | Wt 188.0 lb

## 2023-06-11 DIAGNOSIS — I6523 Occlusion and stenosis of bilateral carotid arteries: Secondary | ICD-10-CM | POA: Diagnosis not present

## 2023-06-11 DIAGNOSIS — I739 Peripheral vascular disease, unspecified: Secondary | ICD-10-CM | POA: Diagnosis not present

## 2023-06-11 NOTE — Progress Notes (Signed)
Office Note     CC:  follow up Requesting Provider:  Olegario Messier, MD  HPI: Nathan [name] is a 73 y.o. (Sep 08, 1950) male who presents for surveillance of carotid artery stenosis.  He underwent left carotid endarterectomy on 02/22/2023 by Dr. Myra Gianotti due to asymptomatic critical ICA stenosis.  He denies any neurological events since last office visit including slurring speech, changes in vision, or one-sided weakness.  He also denies any diagnosis of CVA or TIA.  He is taking aspirin, Plavix, statin daily.  Surgical history also significant for right above-the-knee amputation years ago.  He also has had left popliteal stenting in 2019 by Dr. Myra Gianotti.  He denies claudication.  He ambulates well with a prosthetic.   Past Medical History:  Diagnosis Date   Asthma    per 2003 [facility] pulm records pfts    Blood dyscrasia    HIV   Carotid artery occlusion    Clotting disorder (HCC)    Colon polyps    noted previous colonoscopy [facility]   DDD (degenerative disc disease)    cervical spine   Depression    Diabetes mellitus    dx 2010   Diarrhea 02/24/2021   Dizziness 08/25/2021   Falls 08/25/2021   Fever    unknwon origin   GERD (gastroesophageal reflux disease)    Head swelling 05/23/2017   Hep C w/o coma, chronic (HCC)    History of syphilis    noted [facility] records   HIV infection (HCC)    undetectable viral load and CD4 ct 667 as of 11/2011   Hyperlipidemia    Hypertension    MRSA (methicillin resistant Staphylococcus aureus)    Multiple sclerosis (HCC)    in remission as of 11/2011 (diagnosed late 1980s)   Pain in limb-Right Leg 10/13/2013   Pancreas (digestive gland) works poorly    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HCC)    2002   Pneumonia    Prosthesis fitting 03/10/2019   PVD (peripheral vascular disease) (HCC)    Left Stent 07/02/2008   Scalp lesion 07/26/2017   UTI (urinary tract infection)    Wears dentures     Past Surgical History:  Procedure Laterality  Date   ABDOMINAL AORTOGRAM W/LOWER EXTREMITY N/A 08/07/2017   Procedure: ABDOMINAL AORTOGRAM W/LOWER EXTREMITY;  Surgeon: Nada Libman, MD;  Location: [facility] INVASIVE [facility];  Service: Cardiovascular;  Laterality: N/A;   ABOVE KNEE LEG AMPUTATION  2009   Right Leg   COLONOSCOPY W/ BIOPSIES AND POLYPECTOMY     ENDARTERECTOMY Left 02/22/2023   Procedure: LEFT ENDARTERECTOMY CAROTID;  Surgeon: Nada Libman, MD;  Location: [facility] OR;  Service: Vascular;  Laterality: Left;   LOWER EXTREMITY ANGIOGRAM Left 12/26/2011   Procedure: LOWER EXTREMITY ANGIOGRAM;  Surgeon: Nada Libman, MD;  Location: [facility] CATH LAB;  Service: Cardiovascular;  Laterality: Left;   LOWER EXTREMITY ANGIOGRAM Left 08/17/2017   Procedure: LOWER EXTREMITY ANGIOGRAM LEFT LEG WITH RUNOFF AND Stenting.;  Surgeon: Nada Libman, MD;  Location: [facility] OR;  Service: Vascular;  Laterality: Left;   MULTIPLE TOOTH EXTRACTIONS     OTHER SURGICAL HISTORY     left left with stents (Dr. Osie Cheeks)   OTHER SURGICAL HISTORY     2003 colonoscopy 5 mm polyp transverse colon; (2)20mm  polyps in rectum-hyperplastic   PATCH ANGIOPLASTY Left 02/22/2023   Procedure: PATCH ANGIOPLASTY 1CMx14CM Kathleen Lime;  Surgeon: Nada Libman, MD;  Location: [facility] OR;  Service: Vascular;  Laterality: Left;   PERIPHERAL  VASCULAR INTERVENTION Left 08/17/2017   Procedure: POPLITEAL STENT;  Surgeon: Nada Libman, MD;  Location: [facility] OR;  Service: Vascular;  Laterality: Left;    Social History   Socioeconomic History   Marital status: Single    Spouse name: Not on file   Number of children: 1   Years of education: 12   Highest education level: Not on file  Occupational History   Occupation: retired  Tobacco Use   Smoking status: Former    Current packs/day: 0.00    Types: Cigarettes    Quit date: 1967    Years since quitting: 58.1    Passive exposure: Never   Smokeless tobacco: Former    Types: Chew    Quit date: 1980  Vaping Use   Vaping  status: Never Used  Substance and Sexual Activity   Alcohol use: No    Alcohol/week: 0.0 standard drinks of alcohol   Drug use: No   Sexual activity: Yes    Partners: Male    Comment: declined condoms  Other Topics Concern   Not on file  Social History Narrative   Not on file   Social Drivers of Health   Financial Resource Strain: Low Risk  (04/04/2022)   Overall Financial Resource Strain (CARDIA)    Difficulty of Paying Living Expenses: Not hard at all  Food Insecurity: No Food Insecurity (02/23/2023)   Hunger Vital Sign    Worried About Running Out of Food in the Last Year: Never true    Ran Out of Food in the Last Year: Never true  Transportation Needs: No Transportation Needs (02/23/2023)   PRAPARE - Administrator, Civil Service (Medical): No    Lack of Transportation (Non-Medical): No  Physical Activity: Insufficiently Active (04/04/2022)   Exercise Vital Sign    Days of Exercise per Week: 1 day    Minutes of Exercise per Session: 20 min  Stress: No Stress Concern Present (04/04/2022)   [employer] of Occupational Health - Occupational Stress Questionnaire    Feeling of Stress : Not at all  Social Connections: Socially Isolated (04/04/2022)   Social Connection and Isolation Panel [NHANES]    Frequency of Communication with Friends and Family: Never    Frequency of Social Gatherings with Friends and Family: Once a week    Attends Religious Services: Never    Database administrator or Organizations: No    Attends Banker Meetings: Never    Marital Status: Divorced  Catering manager Violence: Not At Risk (02/23/2023)   Humiliation, Afraid, Rape, and Kick questionnaire    Fear of Current or Ex-Partner: No    Emotionally Abused: No    Physically Abused: No    Sexually Abused: No    Family History  Problem Relation Age of Onset   Diabetes Mother    Coronary artery disease Mother    Coronary artery disease Father    Liver disease  Sister    Cancer Brother        colon caner stage 4 as of 11/2011 (unknown age of onset)   Prostate cancer Brother    Colon cancer Brother    Rectal cancer Neg Hx    Esophageal cancer Neg Hx     Current Outpatient Medications  Medication Sig Dispense Refill   atorvastatin (LIPITOR) 40 MG tablet TAKE 1 TABLET(40 MG) BY MOUTH DAILY 90 tablet 1   bictegravir-emtricitabine-tenofovir AF (BIKTARVY) 50-200-25 MG TABS tablet Take 1 tablet by mouth daily. 30  tablet 11   Blood Glucose Monitoring Suppl (FREESTYLE LITE) w/Device KIT 1 Device by Does not apply route daily in the afternoon. 1 kit 0   cilostazol (PLETAL) 50 MG tablet TAKE 1 TABLET(50 MG) BY MOUTH TWICE DAILY 60 tablet 3   clopidogrel (PLAVIX) 75 MG tablet TAKE 1 TABLET(75 MG) BY MOUTH DAILY 90 tablet 3   Continuous Glucose Sensor (FREESTYLE LIBRE 3 PLUS SENSOR) MISC Change sensor every 15 days. 6 each 3   empagliflozin (JARDIANCE) 25 MG TABS tablet Take 1 tablet (25 mg total) by mouth daily. 90 tablet 3   ezetimibe (ZETIA) 10 MG tablet TAKE 1 TABLET(10 MG) BY MOUTH DAILY 90 tablet 2   glucose blood (FREESTYLE LITE) test strip Use as instructed 100 each 12   glucose blood (FREESTYLE LITE) test strip Use 5 times daily to check blood sugar. 450 each 3   HUMALOG 100 UNIT/ML injection Max daily 110 units per CeQur 110 mL 3   ibuprofen (ADVIL) 200 MG tablet Take 600 mg by mouth every 6 (six) hours as needed for moderate pain.     injection device for insulin (CEQUR SIMPLICITY 2U) DEVI change every 2 days 45 each 3   Injection Device for Insulin (CEQUR SIMPLICITY INSERTER) MISC Use to Apply a new cecur simplicity patch as directed every 2 days 1 each 1   insulin glargine, 2 Unit Dial, (TOUJEO MAX SOLOSTAR) 300 UNIT/ML Solostar Pen Inject 56 Units into the skin in the morning. 15 mL 4   Insulin Pen Needle (BD PEN NEEDLE NANO U/F) 32G X 4 MM MISC 1 Device by Other route daily in the afternoon. USE AS DIRECTED FOUR TIMES DAILY. Dx code  E11.42 100  each 3   Insulin Syringe-Needle U-100 (GNP INSULIN SYRINGES) 30G X 5/16" 1 ML MISC 1 Device by Does not apply route daily in the afternoon. 100 each 3   Lancet Devices (BAYER MICROLET 2 LANCING DEVIC) MISC Use to check blood sugar up to 3 ties a day 1 each 2   Lancets (FREESTYLE) lancets Use 5 times daily to check blood sugar. 450 each 3   lipase/protease/amylase (CREON) 36000 UNITS CPEP capsule Take 1 capsule (36,000 Units total) by mouth 3 (three) times daily before meals. (Patient taking differently: Take 36,000 Units by mouth See admin instructions. Take 36,000 units with each meal and snack) 180 capsule 3   lisinopril (ZESTRIL) 40 MG tablet TAKE 1 TABLET BY MOUTH DAILY. HOLD IF SYSTOLIC BLOOD PRESSURE IS LESS THAN 110 90 tablet 3   omeprazole (PRILOSEC) 40 MG capsule TAKE 1 CAPSULE(40 MG) BY MOUTH DAILY 90 capsule 1   oxyCODONE (OXY IR/ROXICODONE) 5 MG immediate release tablet Take 1 tablet (5 mg total) by mouth every 6 (six) hours as needed for severe pain (pain score 7-10) or moderate pain (pain score 4-6). 10 tablet 0   pioglitazone (ACTOS) 30 MG tablet Take 1 tablet (30 mg total) by mouth daily. 90 tablet 3   tirzepatide (MOUNJARO) 5 MG/0.5ML Pen Inject 5 mg into the skin once a week. 6 mL 3   aspirin EC 81 MG tablet Take 1 tablet (81 mg total) by mouth daily at 6 (six) AM. Swallow whole. (Patient not taking: Reported on 06/11/2023)     No current facility-administered medications for this visit.    Allergies  Allergen Reactions   Sulfonamide Derivatives Hives     REVIEW OF SYSTEMS:   [X]  denotes positive finding, [ ]  denotes negative finding Cardiac  Comments:  Chest  pain or chest pressure:    Shortness of breath upon exertion:    Short of breath when lying flat:    Irregular heart rhythm:        Vascular    Pain in calf, thigh, or hip brought on by ambulation:    Pain in feet at night that wakes you up from your sleep:     Blood clot in your veins:    Leg swelling:          Pulmonary    Oxygen at home:    Productive cough:     Wheezing:         Neurologic    Sudden weakness in arms or legs:     Sudden numbness in arms or legs:     Sudden onset of difficulty speaking or slurred speech:    Temporary loss of vision in one eye:     Problems with dizziness:         Gastrointestinal    Blood in stool:     Vomited blood:         Genitourinary    Burning when urinating:     Blood in urine:        Psychiatric    Major depression:         Hematologic    Bleeding problems:    Problems with blood clotting too easily:        Skin    Rashes or ulcers:        Constitutional    Fever or chills:      PHYSICAL EXAMINATION:  Vitals:   06/11/23 0944 06/11/23 0946  BP: (!) 163/75 (!) 145/74  Pulse: 64   Resp: 18   Temp: 97.8 F (36.6 C)   TempSrc: Temporal   SpO2: 93%   Weight: 188 lb (85.3 kg)   Height: 6\' 1"  (1.854 m)     General:  WDWN in NAD; vital signs documented above Gait: Not observed HENT: WNL, normocephalic Pulmonary: normal non-labored breathing , without Rales, rhonchi,  wheezing Cardiac: regular HR Abdomen: soft, NT, no masses Skin: without rashes Vascular Exam/Pulses: symmetrical radial pulses Extremities: without ischemic changes, without Gangrene , without cellulitis; without open wounds;  Musculoskeletal: no muscle wasting or atrophy  Neurologic: A&O X 3; CN grossly intact Psychiatric:  The pt has Normal affect.   Non-Invasive Vascular Imaging:   Right ICA 60 to 79% stenosis Left carotid endarterectomy site widely patent    ASSESSMENT/PLAN:: 73 y.o. male here for follow up for surveillance of carotid artery stenosis and PAD  Nathan [name] is status post left carotid endarterectomy due to high-grade asymptomatic stenosis.  The incision has completely healed.  Duplex demonstrates a widely patent endarterectomy site.  Right ICA 60 to 79% stenosis remains asymptomatic.  No indication for intervention at this time.  We will  repeat carotid duplex in 6 months.  We will also repeat left lower extremity arterial duplex and ABI in 6 months due to history of popliteal stenting.  He will continue his aspirin, Plavix, statin daily.   Emilie Rutter, PA-C [facility] [phone]  Clinic MD:   Myra Gianotti

## 2023-06-12 ENCOUNTER — Ambulatory Visit (INDEPENDENT_AMBULATORY_CARE_PROVIDER_SITE_OTHER): Payer: Medicare (Managed Care) | Admitting: Infectious Disease

## 2023-06-12 ENCOUNTER — Other Ambulatory Visit: Payer: Self-pay

## 2023-06-12 ENCOUNTER — Encounter: Payer: Self-pay | Admitting: Infectious Disease

## 2023-06-12 VITALS — BP 126/63 | HR 77 | Temp 98.0°F | Ht 73.0 in | Wt 186.0 lb

## 2023-06-12 DIAGNOSIS — B20 Human immunodeficiency virus [HIV] disease: Secondary | ICD-10-CM

## 2023-06-12 DIAGNOSIS — J069 Acute upper respiratory infection, unspecified: Secondary | ICD-10-CM | POA: Diagnosis not present

## 2023-06-12 DIAGNOSIS — Z21 Asymptomatic human immunodeficiency virus [HIV] infection status: Secondary | ICD-10-CM

## 2023-06-12 DIAGNOSIS — I6522 Occlusion and stenosis of left carotid artery: Secondary | ICD-10-CM

## 2023-06-12 DIAGNOSIS — E1151 Type 2 diabetes mellitus with diabetic peripheral angiopathy without gangrene: Secondary | ICD-10-CM

## 2023-06-12 DIAGNOSIS — I739 Peripheral vascular disease, unspecified: Secondary | ICD-10-CM

## 2023-06-12 DIAGNOSIS — E1165 Type 2 diabetes mellitus with hyperglycemia: Secondary | ICD-10-CM

## 2023-06-12 MED ORDER — BIKTARVY 50-200-25 MG PO TABS: 1.0000 | ORAL_TABLET | Freq: Every day | ORAL | 11 refills | Status: DC

## 2023-06-12 NOTE — Progress Notes (Signed)
 Subjective:  Chief complaint: follow-up for HIV disease on medications   Patient ID: Nathan [name], male    DOB: 07-03-1950, 73 y.o.   MRN: [id number]  HPI  Discussed the use of AI scribe software for clinical note transcription with the patient, who gave verbal consent to proceed.  History of Present Illness   Nathan [name], a patient with a long-standing history of well-controlled HIV, presents with upper respiratory symptoms of congestion and post-nasal drip for over a week. He denies fever or other systemic symptoms. He has not been tested for COVID-19. He is considering receiving the COVID-19 vaccine, but is unsure due to his current symptoms. He has not taken any antibiotics for his symptoms, suspecting it to be a viral infection.  Ademide also has a history of diabetes, with a recent HbA1c of 8.3. He underwent a successful left endarterectomy in October for a long-standing, monitored vascular issue in his neck. He denies any current symptoms related to this condition.       Past Medical History:  Diagnosis Date   Asthma    per 2003 [facility] pulm records pfts    Blood dyscrasia    HIV   Carotid artery occlusion    Clotting disorder (HCC)    Colon polyps    noted previous colonoscopy [facility]   DDD (degenerative disc disease)    cervical spine   Depression    Diabetes mellitus    dx 2010   Diarrhea 02/24/2021   Dizziness 08/25/2021   Falls 08/25/2021   Fever    unknwon origin   GERD (gastroesophageal reflux disease)    Head swelling 05/23/2017   Hep C w/o coma, chronic (HCC)    History of syphilis    noted [facility] records   HIV infection (HCC)    undetectable viral load and CD4 ct 667 as of 11/2011   Hyperlipidemia    Hypertension    MRSA (methicillin resistant Staphylococcus aureus)    Multiple sclerosis (HCC)    in remission as of 11/2011 (diagnosed late 1980s)   Pain in limb-Right Leg 10/13/2013   Pancreas (digestive gland) works poorly    PCP (pneumocystis jiroveci  pneumonia) (HCC)    2002   Pneumonia    Prosthesis fitting 03/10/2019   PVD (peripheral vascular disease) (HCC)    Left Stent 07/02/2008   Scalp lesion 07/26/2017   UTI (urinary tract infection)    Wears dentures     Past Surgical History:  Procedure Laterality Date   ABDOMINAL AORTOGRAM W/LOWER EXTREMITY N/A 08/07/2017   Procedure: ABDOMINAL AORTOGRAM W/LOWER EXTREMITY;  Surgeon: Serene Gaile ORN, MD;  Location: [facility] INVASIVE CV LAB;  Service: Cardiovascular;  Laterality: N/A;   ABOVE KNEE LEG AMPUTATION  2009   Right Leg   COLONOSCOPY W/ BIOPSIES AND POLYPECTOMY     ENDARTERECTOMY Left 02/22/2023   Procedure: LEFT ENDARTERECTOMY CAROTID;  Surgeon: Serene Gaile ORN, MD;  Location: [facility] OR;  Service: Vascular;  Laterality: Left;   LOWER EXTREMITY ANGIOGRAM Left 12/26/2011   Procedure: LOWER EXTREMITY ANGIOGRAM;  Surgeon: Gaile ORN Serene, MD;  Location: [facility] CATH LAB;  Service: Cardiovascular;  Laterality: Left;   LOWER EXTREMITY ANGIOGRAM Left 08/17/2017   Procedure: LOWER EXTREMITY ANGIOGRAM LEFT LEG WITH RUNOFF AND Stenting.;  Surgeon: Serene Gaile ORN, MD;  Location: [facility] OR;  Service: Vascular;  Laterality: Left;   MULTIPLE TOOTH EXTRACTIONS     OTHER SURGICAL HISTORY     left left with stents (Dr. Celso)   OTHER SURGICAL HISTORY  2003 colonoscopy 5 mm polyp transverse colon; (2)43mm  polyps in rectum-hyperplastic   PATCH ANGIOPLASTY Left 02/22/2023   Procedure: PATCH ANGIOPLASTY 1CMx14CM GEORGE LISLE;  Surgeon: Serene Gaile ORN, MD;  Location: [facility] OR;  Service: Vascular;  Laterality: Left;   PERIPHERAL VASCULAR INTERVENTION Left 08/17/2017   Procedure: POPLITEAL STENT;  Surgeon: Serene Gaile ORN, MD;  Location: [facility] OR;  Service: Vascular;  Laterality: Left;    Family History  Problem Relation Age of Onset   Diabetes Mother    Coronary artery disease Mother    Coronary artery disease Father    Liver disease Sister    Cancer Brother        colon caner stage 4 as of  11/2011 (unknown age of onset)   Prostate cancer Brother    Colon cancer Brother    Rectal cancer Neg Hx    Esophageal cancer Neg Hx       Social History   Socioeconomic History   Marital status: Single    Spouse name: Not on file   Number of children: 1   Years of education: 12   Highest education level: Not on file  Occupational History   Occupation: retired  Tobacco Use   Smoking status: Former    Current packs/day: 0.00    Types: Cigarettes    Quit date: 1967    Years since quitting: 58.1    Passive exposure: Never   Smokeless tobacco: Former    Types: Chew    Quit date: 1980  Vaping Use   Vaping status: Never Used  Substance and Sexual Activity   Alcohol use: No    Alcohol/week: 0.0 standard drinks of alcohol   Drug use: No   Sexual activity: Yes    Partners: Male    Comment: declined condoms  Other Topics Concern   Not on file  Social History Narrative   Not on file   Social Drivers of Health   Financial Resource Strain: Low Risk  (04/04/2022)   Overall Financial Resource Strain (CARDIA)    Difficulty of Paying Living Expenses: Not hard at all  Food Insecurity: No Food Insecurity (02/23/2023)   Hunger Vital Sign    Worried About Running Out of Food in the Last Year: Never true    Ran Out of Food in the Last Year: Never true  Transportation Needs: No Transportation Needs (02/23/2023)   PRAPARE - Administrator, Civil Service (Medical): No    Lack of Transportation (Non-Medical): No  Physical Activity: Insufficiently Active (04/04/2022)   Exercise Vital Sign    Days of Exercise per Week: 1 day    Minutes of Exercise per Session: 20 min  Stress: No Stress Concern Present (04/04/2022)   [employer] of Occupational Health - Occupational Stress Questionnaire    Feeling of Stress : Not at all  Social Connections: Socially Isolated (04/04/2022)   Social Connection and Isolation Panel [NHANES]    Frequency of Communication with Friends and  Family: Never    Frequency of Social Gatherings with Friends and Family: Once a week    Attends Religious Services: Never    Database Administrator or Organizations: No    Attends Engineer, Structural: Never    Marital Status: Divorced    Allergies  Allergen Reactions   Sulfonamide Derivatives Hives     Current Outpatient Medications:    aspirin  EC 81 MG tablet, Take 1 tablet (81 mg total) by mouth daily at 6 (six) AM.  Swallow whole., Disp: , Rfl:    atorvastatin  (LIPITOR) 40 MG tablet, TAKE 1 TABLET(40 MG) BY MOUTH DAILY, Disp: 90 tablet, Rfl: 1   bictegravir-emtricitabine -tenofovir  AF (BIKTARVY ) 50-200-25 MG TABS tablet, Take 1 tablet by mouth daily., Disp: 30 tablet, Rfl: 11   Blood Glucose Monitoring Suppl (FREESTYLE LITE) w/Device KIT, 1 Device by Does not apply route daily in the afternoon., Disp: 1 kit, Rfl: 0   cilostazol  (PLETAL ) 50 MG tablet, TAKE 1 TABLET(50 MG) BY MOUTH TWICE DAILY, Disp: 60 tablet, Rfl: 3   clopidogrel  (PLAVIX ) 75 MG tablet, TAKE 1 TABLET(75 MG) BY MOUTH DAILY, Disp: 90 tablet, Rfl: 3   Continuous Glucose Sensor (FREESTYLE LIBRE 3 PLUS SENSOR) MISC, Change sensor every 15 days., Disp: 6 each, Rfl: 3   empagliflozin  (JARDIANCE ) 25 MG TABS tablet, Take 1 tablet (25 mg total) by mouth daily., Disp: 90 tablet, Rfl: 3   ezetimibe  (ZETIA ) 10 MG tablet, TAKE 1 TABLET(10 MG) BY MOUTH DAILY, Disp: 90 tablet, Rfl: 2   glucose blood (FREESTYLE LITE) test strip, Use as instructed, Disp: 100 each, Rfl: 12   glucose blood (FREESTYLE LITE) test strip, Use 5 times daily to check blood sugar., Disp: 450 each, Rfl: 3   HUMALOG  100 UNIT/ML injection, Max daily 110 units per CeQur, Disp: 110 mL, Rfl: 3   ibuprofen  (ADVIL ) 200 MG tablet, Take 600 mg by mouth every 6 (six) hours as needed for moderate pain., Disp: , Rfl:    injection device for insulin  (CEQUR SIMPLICITY 2U) DEVI, change every 2 days, Disp: 45 each, Rfl: 3   Injection Device for Insulin  (CEQUR SIMPLICITY  INSERTER) MISC, Use to Apply a new cecur simplicity patch as directed every 2 days, Disp: 1 each, Rfl: 1   insulin  glargine, 2 Unit Dial, (TOUJEO  MAX SOLOSTAR) 300 UNIT/ML Solostar Pen, Inject 56 Units into the skin in the morning., Disp: 15 mL, Rfl: 4   Insulin  Pen Needle (BD PEN NEEDLE NANO U/F) 32G X 4 MM MISC, 1 Device by Other route daily in the afternoon. USE AS DIRECTED FOUR TIMES DAILY. Dx code  E11.42, Disp: 100 each, Rfl: 3   Insulin  Syringe-Needle U-100 (GNP INSULIN  SYRINGES) 30G X 5/16 1 ML MISC, 1 Device by Does not apply route daily in the afternoon., Disp: 100 each, Rfl: 3   Lancet Devices (BAYER MICROLET 2 LANCING DEVIC) MISC, Use to check blood sugar up to 3 ties a day, Disp: 1 each, Rfl: 2   Lancets (FREESTYLE) lancets, Use 5 times daily to check blood sugar., Disp: 450 each, Rfl: 3   lisinopril  (ZESTRIL ) 40 MG tablet, TAKE 1 TABLET BY MOUTH DAILY. HOLD IF SYSTOLIC BLOOD PRESSURE IS LESS THAN 110, Disp: 90 tablet, Rfl: 3   omeprazole  (PRILOSEC) 40 MG capsule, TAKE 1 CAPSULE(40 MG) BY MOUTH DAILY, Disp: 90 capsule, Rfl: 1   pioglitazone  (ACTOS ) 30 MG tablet, Take 1 tablet (30 mg total) by mouth daily., Disp: 90 tablet, Rfl: 3   tirzepatide  (MOUNJARO ) 5 MG/0.5ML Pen, Inject 5 mg into the skin once a week., Disp: 6 mL, Rfl: 3   lipase/protease/amylase (CREON ) 36000 UNITS CPEP capsule, Take 1 capsule (36,000 Units total) by mouth 3 (three) times daily before meals. (Patient not taking: Reported on 06/12/2023), Disp: 180 capsule, Rfl: 3   oxyCODONE  (OXY IR/ROXICODONE ) 5 MG immediate release tablet, Take 1 tablet (5 mg total) by mouth every 6 (six) hours as needed for severe pain (pain score 7-10) or moderate pain (pain score 4-6). (Patient not taking:  Reported on 06/12/2023), Disp: 10 tablet, Rfl: 0   Review of Systems  Constitutional:  Negative for activity change, appetite change, chills, diaphoresis, fatigue, fever and unexpected weight change.  HENT:  Negative for congestion,  rhinorrhea, sinus pressure, sneezing, sore throat and trouble swallowing.   Eyes:  Negative for photophobia and visual disturbance.  Respiratory:  Negative for cough, chest tightness, shortness of breath, wheezing and stridor.   Cardiovascular:  Negative for chest pain, palpitations and leg swelling.  Gastrointestinal:  Negative for abdominal distention, abdominal pain, anal bleeding, blood in stool, constipation, diarrhea, nausea and vomiting.  Genitourinary:  Negative for difficulty urinating, dysuria, flank pain and hematuria.  Musculoskeletal:  Negative for arthralgias, back pain, gait problem, joint swelling and myalgias.  Skin:  Negative for color change, pallor, rash and wound.  Neurological:  Negative for dizziness, tremors, weakness and light-headedness.  Hematological:  Negative for adenopathy. Does not bruise/bleed easily.  Psychiatric/Behavioral:  Negative for agitation, behavioral problems, confusion, decreased concentration, dysphoric mood and sleep disturbance.        Objective:   Physical Exam Constitutional:      Appearance: He is well-developed.  HENT:     Head: Normocephalic and atraumatic.  Eyes:     Conjunctiva/sclera: Conjunctivae normal.  Cardiovascular:     Rate and Rhythm: Normal rate and regular rhythm.  Pulmonary:     Effort: Pulmonary effort is normal. No respiratory distress.     Breath sounds: No wheezing.  Abdominal:     General: There is no distension.     Palpations: Abdomen is soft.  Musculoskeletal:        General: No tenderness. Normal range of motion.     Cervical back: Normal range of motion and neck supple.  Skin:    General: Skin is warm and dry.     Coloration: Skin is not pale.     Findings: No erythema or rash.  Neurological:     General: No focal deficit present.     Mental Status: He is alert and oriented to person, place, and time.  Psychiatric:        Mood and Affect: Mood normal.        Behavior: Behavior normal.         Thought Content: Thought content normal.        Judgment: Judgment normal.           Assessment & Plan:   Assessment and Plan    Upper Respiratory Infection Symptoms for over a week with congestion and postnasal drip. No fever or other systemic symptoms. Likely viral etiology. -Consider COVID-19 testing before proceeding with vaccination.  HIV Stable on Biktarvy  with undetectable viral load and good CD4 count. -Continue current regimen.  Diabetes Mellitus Recent HbA1c 8.3, indicating suboptimal glycemic control. -Consider discussing with primary care provider for possible adjustment of diabetes management.  Carotid Artery Disease Recent successful left endarterectomy in October. -Follow-up with vascular surgery as needed.  COVID-19 Vaccination Discussed vaccination in the setting of current upper respiratory symptoms. -Get tested for COVID-19 before proceeding with vaccination.  Follow-up in 6 months.

## 2023-06-20 ENCOUNTER — Other Ambulatory Visit (HOSPITAL_COMMUNITY): Payer: Self-pay

## 2023-06-20 NOTE — Telephone Encounter (Signed)
Received formulary notification in Onbase (will index to pt's media), ran test claim for 1 dial dose of Toujeo and pt pay amount should be $0.00   While PA is applicable at any filling pharmacy, this test claim was processed through [facility]- copay amounts may vary at other pharmacies due to [employer], or as the patient moves through the different stages of their insurance plan.

## 2023-06-22 ENCOUNTER — Other Ambulatory Visit: Payer: Self-pay

## 2023-06-22 DIAGNOSIS — I6523 Occlusion and stenosis of bilateral carotid arteries: Secondary | ICD-10-CM

## 2023-06-22 DIAGNOSIS — I739 Peripheral vascular disease, unspecified: Secondary | ICD-10-CM

## 2023-07-30 ENCOUNTER — Other Ambulatory Visit: Payer: Self-pay | Admitting: Internal Medicine

## 2023-07-30 DIAGNOSIS — I739 Peripheral vascular disease, unspecified: Secondary | ICD-10-CM

## 2023-07-30 NOTE — Telephone Encounter (Signed)
 Duplicate request

## 2023-08-01 ENCOUNTER — Telehealth: Payer: Self-pay | Admitting: Dietician

## 2023-08-01 ENCOUNTER — Ambulatory Visit: Payer: Medicare (Managed Care) | Admitting: Dietician

## 2023-08-01 ENCOUNTER — Ambulatory Visit: Payer: Medicare (Managed Care) | Admitting: Student

## 2023-08-01 VITALS — BP 151/56 | HR 79 | Temp 97.5°F | Ht 73.0 in | Wt 187.2 lb

## 2023-08-01 DIAGNOSIS — K8689 Other specified diseases of pancreas: Secondary | ICD-10-CM | POA: Diagnosis not present

## 2023-08-01 DIAGNOSIS — E1151 Type 2 diabetes mellitus with diabetic peripheral angiopathy without gangrene: Secondary | ICD-10-CM | POA: Diagnosis not present

## 2023-08-01 DIAGNOSIS — I10 Essential (primary) hypertension: Secondary | ICD-10-CM

## 2023-08-01 DIAGNOSIS — Z794 Long term (current) use of insulin: Secondary | ICD-10-CM

## 2023-08-01 DIAGNOSIS — E1169 Type 2 diabetes mellitus with other specified complication: Secondary | ICD-10-CM

## 2023-08-01 DIAGNOSIS — I6522 Occlusion and stenosis of left carotid artery: Secondary | ICD-10-CM

## 2023-08-01 DIAGNOSIS — B20 Human immunodeficiency virus [HIV] disease: Secondary | ICD-10-CM

## 2023-08-01 DIAGNOSIS — E785 Hyperlipidemia, unspecified: Secondary | ICD-10-CM | POA: Diagnosis not present

## 2023-08-01 LAB — POCT GLYCOSYLATED HEMOGLOBIN (HGB A1C): Hemoglobin A1C: 8.6 % — AB (ref 4.0–5.6)

## 2023-08-01 LAB — HM DIABETES EYE EXAM

## 2023-08-01 LAB — GLUCOSE, CAPILLARY: Glucose-Capillary: 199 mg/dL — ABNORMAL HIGH (ref 70–99)

## 2023-08-01 NOTE — Assessment & Plan Note (Signed)
 Follows with vascular surgery, most recent appointment without concern.  Plan is to undergo vascular ultrasound in 4 more months.

## 2023-08-01 NOTE — Patient Instructions (Addendum)
 Thank you so much for coming to the clinic today!   The number for the stomach doctors is [phone]. Please reach out to them to make an appointment. I want you to get a blood pressure cuff at home and let me know what the numbers are. We are not making any changes to your regimen, but please try and work on your diet.    If you have any questions please feel free to the call the clinic at anytime at [phone]. It was a pleasure seeing you!  Best, Dr. Thomasene Ripple

## 2023-08-01 NOTE — Telephone Encounter (Signed)
 Met with patient briefly today per his request: for several he states that he is hungry on Mounjaro, Ozempic worked better. He also states concern about having had two low blood sugars on 56 units long acting insulin that he is unsure of the name. He agreed to call me with the name and concentration of his long acting insulin as he says it is not Toujeo.   Visual foot exam of left foot done today. He states he does not have feeling in his foot. Skin is intact, no swelling, sores or redness and toenails not long or thick. Shoes and socks are appropriate in style and fit. He had difficulty putting his shoe and sock back on and states he stopped going to podiatry and cuts his own nails. Discussed rationale for having a podiatrist cut his nails and he wished to defer a referral for now.  Requested an order for a  microalbumin creatine ratio as it was due.

## 2023-08-01 NOTE — Progress Notes (Unsigned)
 Diabetes Self-Management Education  Visit Type: Annual Follow-Up  Appt. Start Time: 315 Appt. End Time: 415  08/01/2023  [name], identified by name and date of birth, is a 73 y.o. male with a diagnosis of Diabetes:  .   ASSESSMENT  See phone note. Visit was unplanned but included per patient request a repeat eye exam. We also discussed his CGM, low blood sugars, feet, weight, medicines and a microalb creatinine ratio  Blood sugar looks improved, but dates are off. He states he has na active senosr and our data show his sensor stopped on 3/20.    Diabetes Self-Management Education - 08/01/23 1600       Visit Information   Visit Type Annual Follow-Up      Health Coping   How would you rate your overall health? Good      Psychosocial Assessment   Patient Belief/Attitude about Diabetes Motivated to manage diabetes    What is the hardest part about your diabetes right now, causing you the most concern, or is the most worrisome to you about your diabetes?   Getting support / problem solving    Self-care barriers Lack of material resources    Self-management support Friends    Patient Concerns Support;Glycemic Control    Special Needs None    Preferred Learning Style No preference indicated    Learning Readiness Ready    How often do you need to have someone help you when you read instructions, pamphlets, or other written materials from your doctor or pharmacy? 2 - Rarely    What is the last grade level you completed in school? 12      Pre-Education Assessment   Patient understands using medications safely. Needs Review    Patient understands prevention, detection, and treatment of chronic complications. Needs Review      Complications   Have you had a dilated eye exam in the past 12 months? No    Have you had a dental exam in the past 12 months? Yes    Are you checking your feet? Yes    How many days per week are you checking your feet? 7      Dietary Intake    Breakfast omitted per patient      Activity / Exercise   Activity / Exercise Type ADL's;Light (walking / raking leaves)    How many days per week do you exercise? --   no formal exercise     Patient Education   Previous Diabetes Education Yes (please comment)   here     Individualized Goals (developed by patient)   Medications take my medication as prescribed    Reducing Risk do foot checks daily      Patient Self-Evaluation of Goals - Patient rates self as meeting previously set goals (% of time)   Medications >75% (most of the time)    Reducing Risk (treating acute and chronic complications) >75% (most of the time)      Post-Education Assessment   Patient understands prevention, detection, and treatment of chronic complications. Comprehends key points      Outcomes   Expected Outcomes Demonstrated interest in learning. Expect positive outcomes    Future DMSE 3-4 months    Program Status Completed      Subsequent Visit   Since your last visit have you continued or begun to take your medications as prescribed? Yes    Since your last visit have you experienced any weight changes? Gain  Weight Gain (lbs) 1    Since your last visit, are you checking your blood glucose at least once a day? Yes   has been again using freestyle libre recently            Individualized Plan for Diabetes Self-Management Training:   Learning Objective:  Patient will have a greater understanding of diabetes self-management. Patient education plan is to attend individual and/or group sessions per assessed needs and concerns.   Plan:   There are no Patient Instructions on file for this visit.  Expected Outcomes:  Demonstrated interest in learning. Expect positive outcomes  Education material provided: Diabetes Resources  If problems or questions, patient to contact team via:  Phone  Future DSME appointment: 3-4 months Norm Parcel, RD 08/02/2023 3:56 PM.

## 2023-08-01 NOTE — Assessment & Plan Note (Signed)
 Last lipid panel in October 2024 showed an LDL of 32.  He is on atorvastatin 40 mg and Zetia 10 mg

## 2023-08-01 NOTE — Assessment & Plan Note (Signed)
 Pt not able to tolerate more than 2 doses of creon due to abdominal bloating and flatulence. He has not followed up with GI in quite some time for this, so will have him follow up with them. He is agreeable to trying to do 3 doses daily of creon in the meanwhile.

## 2023-08-01 NOTE — Assessment & Plan Note (Addendum)
 Patient presents for follow-up regarding his diabetes.  His A1c today is blank, compared to 8.3 at last visit.  His current regimen is pioglitazone 30 mg, Jardiance 25 mg, Toujeo 60 units, Humalog 24 units with meals Ozempic 2 mg weekly.  Last time he saw endocrinology was in January, and he has been doing well since.  Will not make any adjustments at this time.

## 2023-08-01 NOTE — Assessment & Plan Note (Signed)
 Blood pressure is elevated today at 151/56. He is compliant with his medication regimen of lisinopril 40mg . This is the first time his blood pressure has been elevated in many visits. He does not have a blood pressure cuff at home. I spoke to him about the idea of starting another BP medication (amlodipine), however he would like to check his blood pressure at home and let us know what his values are. I did educate him on the risks of uncontrolled hypertension and he understands.   Plan:  - Continue lisinopril 40mg 

## 2023-08-01 NOTE — Progress Notes (Signed)
 CC: Yearly check up  HPI:  Nathan [name] is a 73 y.o. male living with a history stated below and presents today for his  yearly checkup. Please see problem based assessment and plan for additional details.  Past Medical History:  Diagnosis Date   Asthma    per 2003 [facility] pulm records pfts    Blood dyscrasia    HIV   Carotid artery occlusion    Clotting disorder (HCC)    Colon polyps    noted previous colonoscopy [facility]   DDD (degenerative disc disease)    cervical spine   Depression    Diabetes mellitus    dx 2010   Diarrhea 02/24/2021   Dizziness 08/25/2021   Falls 08/25/2021   Fever    unknwon origin   GERD (gastroesophageal reflux disease)    Head swelling 05/23/2017   Hep C w/o coma, chronic (HCC)    History of syphilis    noted [facility] records   HIV infection (HCC)    undetectable viral load and CD4 ct 667 as of 11/2011   Hyperlipidemia    Hypertension    MRSA (methicillin resistant Staphylococcus aureus)    Multiple sclerosis (HCC)    in remission as of 11/2011 (diagnosed late 1980s)   Pain in limb-Right Leg 10/13/2013   Pancreas (digestive gland) works poorly    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HCC)    2002   Pneumonia    Prosthesis fitting 03/10/2019   PVD (peripheral vascular disease) (HCC)    Left Stent 07/02/2008   Scalp lesion 07/26/2017   UTI (urinary tract infection)    Wears dentures     Current Outpatient Medications on File Prior to Visit  Medication Sig Dispense Refill   atorvastatin (LIPITOR) 40 MG tablet TAKE 1 TABLET(40 MG) BY MOUTH DAILY 90 tablet 1   bictegravir-emtricitabine-tenofovir AF (BIKTARVY) 50-200-25 MG TABS tablet Take 1 tablet by mouth daily. 30 tablet 11   Blood Glucose Monitoring Suppl (FREESTYLE LITE) w/Device KIT 1 Device by Does not apply route daily in the afternoon. 1 kit 0   cilostazol (PLETAL) 50 MG tablet TAKE 1 TABLET(50 MG) BY MOUTH TWICE DAILY 60 tablet 3   clopidogrel (PLAVIX) 75 MG tablet TAKE 1  TABLET(75 MG) BY MOUTH DAILY 90 tablet 3   Continuous Glucose Sensor (FREESTYLE LIBRE 3 PLUS SENSOR) MISC Change sensor every 15 days. 6 each 3   empagliflozin (JARDIANCE) 25 MG TABS tablet Take 1 tablet (25 mg total) by mouth daily. 90 tablet 3   ezetimibe (ZETIA) 10 MG tablet TAKE 1 TABLET(10 MG) BY MOUTH DAILY 90 tablet 2   glucose blood (FREESTYLE LITE) test strip Use as instructed 100 each 12   glucose blood (FREESTYLE LITE) test strip Use 5 times daily to check blood sugar. 450 each 3   HUMALOG 100 UNIT/ML injection Max daily 110 units per CeQur 110 mL 3   ibuprofen (ADVIL) 200 MG tablet Take 600 mg by mouth every 6 (six) hours as needed for moderate pain.     injection device for insulin (CEQUR SIMPLICITY 2U) DEVI change every 2 days 45 each 3   Injection Device for Insulin (CEQUR SIMPLICITY INSERTER) MISC Use to Apply a new cecur simplicity patch as directed every 2 days 1 each 1   insulin glargine, 2 Unit Dial, (TOUJEO MAX SOLOSTAR) 300 UNIT/ML Solostar Pen Inject 56 Units into the skin in the morning. 15 mL 4   Insulin Pen Needle (BD PEN NEEDLE NANO U/F) 32G  X 4 MM MISC 1 Device by Other route daily in the afternoon. USE AS DIRECTED FOUR TIMES DAILY. Dx code  E11.42 100 each 3   Insulin Syringe-Needle U-100 (GNP INSULIN SYRINGES) 30G X 5/16" 1 ML MISC 1 Device by Does not apply route daily in the afternoon. 100 each 3   Lancet Devices (BAYER MICROLET 2 LANCING DEVIC) MISC Use to check blood sugar up to 3 ties a day 1 each 2   Lancets (FREESTYLE) lancets Use 5 times daily to check blood sugar. 450 each 3   lipase/protease/amylase (CREON) 36000 UNITS CPEP capsule Take 1 capsule (36,000 Units total) by mouth 3 (three) times daily before meals. (Patient not taking: Reported on 06/12/2023) 180 capsule 3   lisinopril (ZESTRIL) 40 MG tablet TAKE 1 TABLET BY MOUTH DAILY. HOLD IF SYSTOLIC BLOOD PRESSURE IS LESS THAN 110 90 tablet 3   omeprazole (PRILOSEC) 40 MG capsule TAKE 1 CAPSULE(40 MG) BY MOUTH  DAILY 90 capsule 1   oxyCODONE (OXY IR/ROXICODONE) 5 MG immediate release tablet Take 1 tablet (5 mg total) by mouth every 6 (six) hours as needed for severe pain (pain score 7-10) or moderate pain (pain score 4-6). (Patient not taking: Reported on 06/12/2023) 10 tablet 0   pioglitazone (ACTOS) 30 MG tablet Take 1 tablet (30 mg total) by mouth daily. 90 tablet 3   tirzepatide (MOUNJARO) 5 MG/0.5ML Pen Inject 5 mg into the skin once a week. 6 mL 3   No current facility-administered medications on file prior to visit.    Family History  Problem Relation Age of Onset   Diabetes Mother    Coronary artery disease Mother    Coronary artery disease Father    Liver disease Sister    Cancer Brother        colon caner stage 4 as of 11/2011 (unknown age of onset)   Prostate cancer Brother    Colon cancer Brother    Rectal cancer Neg Hx    Esophageal cancer Neg Hx     Social History   Socioeconomic History   Marital status: Single    Spouse name: Not on file   Number of children: 1   Years of education: 12   Highest education level: Not on file  Occupational History   Occupation: retired  Tobacco Use   Smoking status: Former    Current packs/day: 0.00    Types: Cigarettes    Quit date: 1967    Years since quitting: 58.2    Passive exposure: Never   Smokeless tobacco: Former    Types: Chew    Quit date: 1980  Vaping Use   Vaping status: Never Used  Substance and Sexual Activity   Alcohol use: No    Alcohol/week: 0.0 standard drinks of alcohol   Drug use: No   Sexual activity: Yes    Partners: Male    Comment: declined condoms  Other Topics Concern   Not on file  Social History Narrative   Not on file   Social Drivers of Health   Financial Resource Strain: Low Risk  (04/04/2022)   Overall Financial Resource Strain (CARDIA)    Difficulty of Paying Living Expenses: Not hard at all  Food Insecurity: No Food Insecurity (02/23/2023)   Hunger Vital Sign    Worried About  Running Out of Food in the Last Year: Never true    Ran Out of Food in the Last Year: Never true  Transportation Needs: No Transportation Needs (02/23/2023)  PRAPARE - Administrator, Civil Service (Medical): No    Lack of Transportation (Non-Medical): No  Physical Activity: Insufficiently Active (04/04/2022)   Exercise Vital Sign    Days of Exercise per Week: 1 day    Minutes of Exercise per Session: 20 min  Stress: No Stress Concern Present (04/04/2022)   [employer] of Occupational Health - Occupational Stress Questionnaire    Feeling of Stress : Not at all  Social Connections: Socially Isolated (04/04/2022)   Social Connection and Isolation Panel [NHANES]    Frequency of Communication with Friends and Family: Never    Frequency of Social Gatherings with Friends and Family: Once a week    Attends Religious Services: Never    Database administrator or Organizations: No    Attends Banker Meetings: Never    Marital Status: Divorced  Catering manager Violence: Not At Risk (02/23/2023)   Humiliation, Afraid, Rape, and Kick questionnaire    Fear of Current or Ex-Partner: No    Emotionally Abused: No    Physically Abused: No    Sexually Abused: No    Review of Systems: ROS negative except for what is noted on the assessment and plan.  Vitals:   08/01/23 1401 08/01/23 1408 08/01/23 1440  BP:  (!) 157/67 (!) 151/56  Pulse:  85 79  Temp:  (!) 97.5 F (36.4 C)   TempSrc: Oral Oral   SpO2:  95%   Weight: 187 lb 3.2 oz (84.9 kg) 187 lb 3.2 oz (84.9 kg)   Height: 6\' 1"  (1.854 m) 6\' 1"  (1.854 m)     Physical Exam: Constitutional: well-appearing male  in no acute distress HENT: normocephalic atraumatic, mucous membranes moist Eyes: conjunctiva non-erythematous Neck: supple Cardiovascular: regular rate and rhythm, no m/r/g Pulmonary/Chest: normal work of breathing on room air, lungs clear to auscultation bilaterally Abdominal: soft, non-tender,  non-distended MSK: normal bulk and tone Neurological: alert & oriented x 3, 5/5 strength in bilateral upper and lower extremities, normal gait Skin: warm and dry Psych: normal mood and affect  Assessment & Plan:   Type 2 DM with diabetic peripheral angiopathy w/o gangrene [facility]) Patient presents for follow-up regarding his diabetes.  His A1c today is blank, compared to 8.3 at last visit.  His current regimen is pioglitazone 30 mg, Jardiance 25 mg, Toujeo 60 units, Humalog 24 units with meals Ozempic 2 mg weekly.  Last time he saw endocrinology was in January, and he has been doing well since.  Will not make any adjustments at this time.   Carotid artery stenosis Follows with vascular surgery, most recent appointment without concern.  Plan is to undergo vascular ultrasound in 4 more months.  HIV disease (HCC) Continuing Biktarvy, follows with infectious disease  Hyperlipidemia associated with type 2 diabetes mellitus (HCC) Last lipid panel in October 2024 showed an LDL of 32.  He is on atorvastatin 40 mg and Zetia 10 mg  Benign essential HTN Blood pressure is elevated today at 151/56. He is compliant with his medication regimen of lisinopril 40mg . This is the first time his blood pressure has been elevated in many visits. He does not have a blood pressure cuff at home. I spoke to him about the idea of starting another BP medication (amlodipine), however he would like to check his blood pressure at home and let us know what his values are. I did educate him on the risks of uncontrolled hypertension and he understands.   Plan:  -  Continue lisinopril 40mg   Pancreatic insufficiency Pt not able to tolerate more than 2 doses of creon due to abdominal bloating and flatulence. He has not followed up with GI in quite some time for this, so will have him follow up with them. He is agreeable to trying to do 3 doses daily of creon in the meanwhile.    Patient discussed with Dr. Lennon Alstrom  Maryan Sivak, M.D. [facility] Internal Medicine, PGY-2 Pager: [phone] Date 08/01/2023 Time 5:13 PM

## 2023-08-01 NOTE — Assessment & Plan Note (Signed)
 Continuing Biktarvy, follows with infectious disease

## 2023-08-02 LAB — MICROALBUMIN / CREATININE URINE RATIO
Creatinine, Urine: 47.1 mg/dL
Microalb/Creat Ratio: 36 mg/g{creat} — ABNORMAL HIGH (ref 0–29)
Microalbumin, Urine: 17.1 ug/mL

## 2023-08-02 NOTE — Progress Notes (Addendum)
 Internal Medicine Clinic Attending  Case discussed with the resident at the time of the visit.  We reviewed the resident's history and exam and pertinent patient test results.  I agree with the assessment, diagnosis, and plan of care documented in the resident's note. Following with endocrinology for diabetes management.

## 2023-08-02 NOTE — Addendum Note (Signed)
 Addended by: Dickie La on: 08/02/2023 10:27 AM   Modules accepted: Level of Service

## 2023-08-02 NOTE — Telephone Encounter (Signed)
 Patient called leaving a message and said his long acting insulin is glargine u300 Max "solar" I think maybe he meant solostar.  I returned his call about the dates in his CGM reader possibly being off and that he should check them and correct them if needed.

## 2023-08-03 NOTE — Telephone Encounter (Signed)
Attempted to contact patient no answer and vm full.

## 2023-08-03 NOTE — Telephone Encounter (Signed)
 08/02/23 6am-100 fasting Noon-250 9pm 350  08/03/23 6am-160 9:30-147    Mounjaro 5 mg weekly  Pioglitazone 30 mg daily  Jardiance 25 mg,  daily  Toujeo 56  units every morning   Humalog 15 clicks with each meal  with Humalog correctional   Patient using the reader not phone for Dexcom

## 2023-08-03 NOTE — Telephone Encounter (Signed)
 I have left a message on their machine to have them return call. No answer at this time.

## 2023-08-06 NOTE — Telephone Encounter (Signed)
 Patient notified

## 2023-08-16 ENCOUNTER — Encounter: Payer: Self-pay | Admitting: Dietician

## 2023-08-16 ENCOUNTER — Telehealth (INDEPENDENT_AMBULATORY_CARE_PROVIDER_SITE_OTHER): Payer: Medicare (Managed Care) | Admitting: Dietician

## 2023-08-16 DIAGNOSIS — E1151 Type 2 diabetes mellitus with diabetic peripheral angiopathy without gangrene: Secondary | ICD-10-CM

## 2023-08-16 DIAGNOSIS — Z794 Long term (current) use of insulin: Secondary | ICD-10-CM

## 2023-08-16 NOTE — Progress Notes (Signed)
 Retinal results were abstracted into patient's chart. See Ophthalmology section of Health Maintenance section of his chart for details  Norm Parcel, RD 08/16/2023 10:20 AM.

## 2023-08-16 NOTE — Telephone Encounter (Signed)
 Left a Message that eye exam results were completed and he should get them in the mail in the next few weeks.

## 2023-08-27 ENCOUNTER — Other Ambulatory Visit: Payer: Self-pay | Admitting: Infectious Disease

## 2023-08-27 DIAGNOSIS — B20 Human immunodeficiency virus [HIV] disease: Secondary | ICD-10-CM

## 2023-08-27 DIAGNOSIS — Z21 Asymptomatic human immunodeficiency virus [HIV] infection status: Secondary | ICD-10-CM

## 2023-08-28 ENCOUNTER — Other Ambulatory Visit: Payer: Self-pay | Admitting: Student

## 2023-08-28 DIAGNOSIS — K219 Gastro-esophageal reflux disease without esophagitis: Secondary | ICD-10-CM

## 2023-08-28 NOTE — Telephone Encounter (Signed)
 Medication sent to pharmacy

## 2023-08-29 ENCOUNTER — Ambulatory Visit (INDEPENDENT_AMBULATORY_CARE_PROVIDER_SITE_OTHER): Payer: Medicare (Managed Care) | Admitting: Internal Medicine

## 2023-08-29 ENCOUNTER — Encounter: Payer: Self-pay | Admitting: Internal Medicine

## 2023-08-29 VITALS — BP 124/70 | HR 74 | Ht 73.0 in | Wt 185.0 lb

## 2023-08-29 DIAGNOSIS — Z794 Long term (current) use of insulin: Secondary | ICD-10-CM

## 2023-08-29 DIAGNOSIS — E1142 Type 2 diabetes mellitus with diabetic polyneuropathy: Secondary | ICD-10-CM | POA: Diagnosis not present

## 2023-08-29 DIAGNOSIS — E1151 Type 2 diabetes mellitus with diabetic peripheral angiopathy without gangrene: Secondary | ICD-10-CM

## 2023-08-29 DIAGNOSIS — E1169 Type 2 diabetes mellitus with other specified complication: Secondary | ICD-10-CM

## 2023-08-29 DIAGNOSIS — E1165 Type 2 diabetes mellitus with hyperglycemia: Secondary | ICD-10-CM

## 2023-08-29 MED ORDER — TIRZEPATIDE 7.5 MG/0.5ML ~~LOC~~ SOAJ: 7.5000 mg | SUBCUTANEOUS | 3 refills | Status: DC

## 2023-08-29 MED ORDER — TOUJEO MAX SOLOSTAR 300 UNIT/ML ~~LOC~~ SOPN: 45.0000 [IU] | PEN_INJECTOR | Freq: Every morning | SUBCUTANEOUS | 4 refills | Status: DC

## 2023-08-29 NOTE — Progress Notes (Signed)
 Name: Nathan [name]  MRN/ DOB: [id number], August 27, 1950   Age/ Sex: 73 y.o., male    PCP: Nooruddin, Saad, MD   Reason for Endocrinology Evaluation: Type 2 Diabetes Mellitus     Date of Initial Endocrinology Visit: 09/09/2021    PATIENT IDENTIFIER: [name] is a 73 y.o. male with a past medical history of T2DM, HIV, HTN, GERD and Dyslipidemia. The patient presented for initial endocrinology clinic visit on 08/29/2023 for consultative assistance with his diabetes management.    HPI: [name] was    Diagnosed with DM 2010 Prior Medications tried/Intolerance: Jardiance  started 08/2021. Has metformin  intolerance . Started Ozempic  2022          Hemoglobin A1c has ranged from 5.6% in 2019, peaking at 10.5% in 2022.   No prior DKA    On his initial visit to our clinic he had an A1c of 7.2%, we increased Jardiance , continued Ozempic , adjusted MDI regimen  He was restarted on pioglitazone  through his PCPs office 07/2022   I switch Ozempic  to Mounjaro 05/2023, due to persistent hyperglycemia with an A1c of 8.3%  SUBJECTIVE:   During the last visit (05/25/2023): A1c 8.3%    Today (08/29/23): Nathan [name] is here for a follow up on diabetes management . He checks his blood sugars 1-3 times daily. The patient has had hypoglycemic episodes since the last clinic visit. He is not symptomatic   Continues to follow-up with infectious disease for HIV management He continues with chronic diarrhea due to pancreatic insufficiency He is s/p left  carotid endarterectomy 02/2023, continues to follow-up with vascular surgery   Denies nausea or vomiting  No changes in chronic diarrhea while mounjaro    HOME DIABETES REGIMEN: Pioglitazone  30 mg daily Jardiance  25 mg daily  Toujeo  ( Generic )  56  units daily  HUmalog  30 units with meal  - 15 clicks with each meal  Mounjaro 5 mg weekly CeQur    Statin: yes ACE-I/ARB: yes   CONTINUOUS GLUCOSE MONITORING RECORD  INTERPRETATION    Dates of Recording: 4/10- 08/29/2023  Sensor description:freestyle libre 3  Results statistics:   CGM use % of time 96  Average and SD 206/32.3  Time in range    40    %  % Time Above 180 34  % Time above 250 25  % Time Below target 1   Glycemic patterns summary: BG's trend down overnight and fluctuate during the day  Hyperglycemic episodes postprandial  Hypoglycemic episodes occurred overnight   Overnight periods: Trends down    DIABETIC COMPLICATIONS: Microvascular complications:  S/P right Above knee amputations, neuropathy Denies: CKD , retinopathy Last eye exam: Completed 04/04/2022  Macrovascular complications:  PVD Denies: CAD, CVA   PAST HISTORY: Past Medical History:  Past Medical History:  Diagnosis Date   Asthma    per 2003 [facility] pulm records [facility]    Blood dyscrasia    HIV   Carotid artery occlusion    Clotting disorder (HCC)    Colon polyps    noted previous colonoscopy [facility]   DDD (degenerative disc disease)    cervical spine   Depression    Diabetes mellitus    dx 2010   Diarrhea 02/24/2021   Dizziness 08/25/2021   Falls 08/25/2021   Fever    unknwon origin   GERD (gastroesophageal reflux disease)    Head swelling 05/23/2017   Hep C w/o coma, chronic (HCC)    History of syphilis  noted [facility] records   HIV infection (HCC)    undetectable viral load and CD4 ct 667 as of 11/2011   Hyperlipidemia    Hypertension    MRSA (methicillin resistant Staphylococcus aureus)    Multiple sclerosis (HCC)    in remission as of 11/2011 (diagnosed late 1980s)   Pain in limb-Right Leg 10/13/2013   Pancreas (digestive gland) works poorly    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HCC)    2002   Pneumonia    Prosthesis fitting 03/10/2019   PVD (peripheral vascular disease) (HCC)    Left Stent 07/02/2008   Scalp lesion 07/26/2017   UTI (urinary tract infection)    Wears dentures    Past Surgical History:  Past Surgical History:   Procedure Laterality Date   ABDOMINAL AORTOGRAM W/LOWER EXTREMITY N/A 08/07/2017   Procedure: ABDOMINAL AORTOGRAM W/LOWER EXTREMITY;  Surgeon: Margherita Shell, MD;  Location: [facility] INVASIVE [facility];  Service: Cardiovascular;  Laterality: N/A;   ABOVE KNEE LEG AMPUTATION  2009   Right Leg   COLONOSCOPY W/ BIOPSIES AND POLYPECTOMY     ENDARTERECTOMY Left 02/22/2023   Procedure: LEFT ENDARTERECTOMY CAROTID;  Surgeon: Margherita Shell, MD;  Location: [facility] OR;  Service: Vascular;  Laterality: Left;   LOWER EXTREMITY ANGIOGRAM Left 12/26/2011   Procedure: LOWER EXTREMITY ANGIOGRAM;  Surgeon: Margherita Shell, MD;  Location: [facility] CATH LAB;  Service: Cardiovascular;  Laterality: Left;   LOWER EXTREMITY ANGIOGRAM Left 08/17/2017   Procedure: LOWER EXTREMITY ANGIOGRAM LEFT LEG WITH RUNOFF AND Stenting.;  Surgeon: Margherita Shell, MD;  Location: [facility] OR;  Service: Vascular;  Laterality: Left;   MULTIPLE TOOTH EXTRACTIONS     OTHER SURGICAL HISTORY     left left with stents (Dr. Estle Hemp)   OTHER SURGICAL HISTORY     2003 colonoscopy 5 mm polyp transverse colon; (2)73mm  polyps in rectum-hyperplastic   PATCH ANGIOPLASTY Left 02/22/2023   Procedure: PATCH ANGIOPLASTY 1CMx14CM Hazeline Lister;  Surgeon: Margherita Shell, MD;  Location: [facility] OR;  Service: Vascular;  Laterality: Left;   PERIPHERAL VASCULAR INTERVENTION Left 08/17/2017   Procedure: POPLITEAL STENT;  Surgeon: Margherita Shell, MD;  Location: [facility] OR;  Service: Vascular;  Laterality: Left;    Social History:  reports that he quit smoking about 58 years ago. His smoking use included cigarettes. He has never been exposed to tobacco smoke. He quit smokeless tobacco use about 45 years ago.  His smokeless tobacco use included chew. He reports that he does not drink alcohol and does not use drugs. Family History:  Family History  Problem Relation Age of Onset   Diabetes Mother    Coronary artery disease Mother    Coronary artery disease Father     Liver disease Sister    Cancer Brother        colon caner stage 4 as of 11/2011 (unknown age of onset)   Prostate cancer Brother    Colon cancer Brother    Rectal cancer Neg Hx    Esophageal cancer Neg Hx      HOME MEDICATIONS: Allergies as of 08/29/2023       Reactions   Sulfonamide Derivatives Hives        Medication List        Accurate as of August 29, 2023  3:22 PM. If you have any questions, ask your nurse or doctor.          STOP taking these medications    tirzepatide 5 MG/0.5ML Pen Commonly known  as: MOUNJARO Replaced by: tirzepatide 7.5 MG/0.5ML Pen Stopped by: Camilla Cedar Marquesha Robideau       TAKE these medications    atorvastatin  40 MG tablet Commonly known as: LIPITOR TAKE 1 TABLET(40 MG) BY MOUTH DAILY   Bayer Microlet 2 Lancing Devic Misc Use to check blood sugar up to 3 ties a day   BD Pen Needle Nano U/F 32G X 4 MM Misc Generic drug: Insulin  Pen Needle 1 Device by Other route daily in the afternoon. USE AS DIRECTED FOUR TIMES DAILY. Dx code  E11.42   Biktarvy  50-200-25 MG Tabs tablet Generic drug: bictegravir-emtricitabine -tenofovir  AF Take 1 tablet by mouth daily.   Artist Use to Apply a new cecur simplicity patch as directed every 2 days   CeQur Simplicity 2U Devi Generic drug: injection device for insulin  change every 2 days   cilostazol  50 MG tablet Commonly known as: PLETAL  TAKE 1 TABLET(50 MG) BY MOUTH TWICE DAILY   clopidogrel  75 MG tablet Commonly known as: PLAVIX  TAKE 1 TABLET(75 MG) BY MOUTH DAILY   empagliflozin  25 MG Tabs tablet Commonly known as: Jardiance  Take 1 tablet (25 mg total) by mouth daily.   ezetimibe  10 MG tablet Commonly known as: ZETIA  TAKE 1 TABLET(10 MG) BY MOUTH DAILY   freestyle lancets Use 5 times daily to check blood sugar.   FreeStyle Libre 3 Plus Sensor Misc Change sensor every 15 days.   FREESTYLE LITE test strip Generic drug: glucose blood Use as instructed    FREESTYLE LITE test strip Generic drug: glucose blood Use 5 times daily to check blood sugar.   FreeStyle Lite w/Device Kit 1 Device by Does not apply route daily in the afternoon.   HumaLOG  100 UNIT/ML injection Generic drug: insulin  lispro Max daily 110 units per CeQur   ibuprofen  200 MG tablet Commonly known as: ADVIL  Take 600 mg by mouth every 6 (six) hours as needed for moderate pain.   Insulin  Syringe-Needle U-100 30G X 5/16" 1 ML Misc Commonly known as: GNP Insulin  Syringes 1 Device by Does not apply route daily in the afternoon.   lipase/protease/amylase 63016 UNITS Cpep capsule Commonly known as: Creon  Take 1 capsule (36,000 Units total) by mouth 3 (three) times daily before meals.   lisinopril  40 MG tablet Commonly known as: ZESTRIL  TAKE 1 TABLET BY MOUTH DAILY. HOLD IF SYSTOLIC BLOOD PRESSURE IS LESS THAN 110   omeprazole  40 MG capsule Commonly known as: PRILOSEC TAKE 1 CAPSULE(40 MG) BY MOUTH DAILY   oxyCODONE  5 MG immediate release tablet Commonly known as: Oxy IR/ROXICODONE  Take 1 tablet (5 mg total) by mouth every 6 (six) hours as needed for severe pain (pain score 7-10) or moderate pain (pain score 4-6).   pioglitazone  30 MG tablet Commonly known as: ACTOS  Take 1 tablet (30 mg total) by mouth daily.   tirzepatide 7.5 MG/0.5ML Pen Commonly known as: MOUNJARO Inject 7.5 mg into the skin once a week. Replaces: tirzepatide 5 MG/0.5ML Pen Started by: Camilla Cedar Yazleen Molock   Toujeo  Max SoloStar 300 UNIT/ML Solostar Pen Generic drug: insulin  glargine (2 Unit Dial) Inject 45 Units into the skin in the morning. What changed: how much to take Changed by: Camilla Cedar Karanveer Ramakrishnan         ALLERGIES: Allergies  Allergen Reactions   Sulfonamide Derivatives Hives     REVIEW OF SYSTEMS: A comprehensive ROS was conducted with the patient and is negative except as per HPI    OBJECTIVE:   VITAL SIGNS: BP  124/70 (BP Location: Left Arm, Patient Position:  Sitting, Cuff Size: Normal)   Pulse 74   Ht 6\' 1"  (1.854 m)   Wt 185 lb (83.9 kg)   SpO2 98%   BMI 24.41 kg/m    PHYSICAL EXAM:  General: Pt appears well and is in NAD  Lungs: Clear with good BS bilat   Heart: RRR   Neuro: MS is good with appropriate affect, pt is alert and Ox3   DM Foot Exam 08/29/2023 RBKA Left foot : No pulses  Sensation absent      DATA REVIEWED:  Lab Results  Component Value Date   HGBA1C 8.6 (A) 08/01/2023   HGBA1C 8.3 (A) 05/25/2023   HGBA1C 8.2 (A) 01/01/2023     Latest Reference Range & Units 05/30/23 09:41  Sodium 135 - 146 mmol/L 139  Potassium 3.5 - 5.3 mmol/L 4.0  Chloride 98 - 110 mmol/L 104  CO2 20 - 32 mmol/L 26  Glucose 65 - 99 mg/dL 621 (H)  BUN 7 - 25 mg/dL 12  Creatinine 3.08 - 6.57 mg/dL 8.46  Calcium  8.6 - 10.3 mg/dL 9.8  BUN/Creatinine Ratio 6 - 22 (calc) SEE NOTE:  eGFR > OR = 60 mL/min/1.92m2 64  AG Ratio 1.0 - 2.5 (calc) 1.6  AST 10 - 35 U/L 17  ALT 9 - 46 U/L 15  Total Protein 6.1 - 8.1 g/dL 7.3  Total Bilirubin 0.2 - 1.2 mg/dL 0.5    Latest Reference Range & Units 08/01/23 15:34  Microalbumin, Urine Not Estab. ug/mL 17.1  MICROALB/CREAT RATIO 0 - 29 mg/g creat 36 (H)  Creatinine, Urine Not Estab. mg/dL 96.2  (H): Data is abnormally high     ASSESSMENT / PLAN / RECOMMENDATIONS:   1) Type 2 Diabetes Mellitus, Poorly controlled, With Neuropathic ,macrovascular complications and microalbuminuria - Most recent A1c of 8.6%. Goal A1c < 7.0 %.    - A1c continues to be above goal -Patient opted to avoid insulin  pump technology as he endorsed learning difficulty -Patient has chronic diarrhea due to pancreatic insufficiency, this has not worsened while on Ozempic  nor Mounjaro, I have recommended increasing Mounjaro  -I will decrease Toujeo  as he has been noted with hypoglycemia in the morning   MEDICATIONS:  Increase Mounjaro 7.5 mg weekly Continue pioglitazone  30 mg daily Continue Jardiance  25 mg, 1 tablet  daily Decrease Toujeo  44 units daily Continue Humalog  30 units with each meal  (15 clicks) Continue correction factor : Humalog  ( BG-130/25) TIDQAC  EDUCATION / INSTRUCTIONS: BG monitoring instructions: Patient is instructed to check his blood sugars 3 times a day, before meals. Call [facility] if: BG persistently < 70  I reviewed the Rule of 15 for the treatment of hypoglycemia in detail with the patient. Literature supplied.   2) Diabetic complications:  Eye: Does not have known diabetic retinopathy.  Neuro/ Feet: Does  have known diabetic peripheral neuropathy. Renal: Patient does not have known baseline CKD. He is  on an ACEI/ARB at present.  3) Dyslipidemia :   -LDL and TG remain at goal  Medication  Continue atorvastatin  40 mg daily    Follow-up in 3 months    Signed electronically by: Natale Bail, MD  [facility]  [facility] [address] [place] [place], [place] [address] Phone: [phone] FAX: [phone]   CC: Nooruddin, Saad, MD [address] [place] [address] Phone: [phone]  Fax: [phone]    Return to Endocrinology clinic as below:  Future Appointments  Date Time Provider Department Center  11/30/2023 11:30 AM Kaulder Zahner, Julian Obey, MD [facility] None  12/04/2023 10:00 AM [facility]  12/10/2023  9:00 AM [facility] 3 [facility]  12/10/2023 10:00 AM [facility] 3 [facility]  12/10/2023 10:30 AM [facility] 3 [facility]  12/10/2023 11:00 AM [facility] H[facility]V  12/18/2023 10:30 AM Ernie Heal, Jerelyn Money, MD [facility]

## 2023-08-29 NOTE — Patient Instructions (Signed)
 Increase Mounjaro 7.5 mg weekly  Continue Pioglitazone  30 mg daily  Continue Jardiance  25 mg, 1 tablet every morning  daily  Decrease Toujeo  ( Insulin  Glargine) 44  units every morning  Continue  HUmalog  15 clicks with each meal     HOW TO TREAT LOW BLOOD SUGARS (Blood sugar LESS THAN 70 MG/DL) Please follow the RULE OF 15 for the treatment of hypoglycemia treatment (when your (blood sugars are less than 70 mg/dL)   STEP 1: Take 15 grams of carbohydrates when your blood sugar is low, which includes:  3-4 GLUCOSE TABS  OR 3-4 OZ OF JUICE OR REGULAR SODA OR ONE TUBE OF GLUCOSE GEL    STEP 2: RECHECK blood sugar in 15 MINUTES STEP 3: If your blood sugar is still low at the 15 minute recheck --> then, go back to STEP 1 and treat AGAIN with another 15 grams of carbohydrates.

## 2023-08-30 ENCOUNTER — Encounter: Payer: Self-pay | Admitting: Internal Medicine

## 2023-09-05 NOTE — Progress Notes (Signed)
 The ASCVD Risk score (Arnett DK, et al., 2019) failed to calculate for the following reasons:   The valid total cholesterol range is 130 to 320 mg/dL  Arlon Bergamo, BSN, RN

## 2023-10-06 ENCOUNTER — Other Ambulatory Visit: Payer: Self-pay | Admitting: Internal Medicine

## 2023-10-14 ENCOUNTER — Other Ambulatory Visit: Payer: Self-pay | Admitting: Student

## 2023-10-17 ENCOUNTER — Telehealth: Payer: Self-pay | Admitting: Dietician

## 2023-10-17 NOTE — Telephone Encounter (Signed)
 Calling to schedule Diabetes Self Management Education & Support  Follow up.

## 2023-10-26 ENCOUNTER — Other Ambulatory Visit: Payer: Self-pay | Admitting: Student

## 2023-10-26 DIAGNOSIS — I739 Peripheral vascular disease, unspecified: Secondary | ICD-10-CM

## 2023-10-29 NOTE — Telephone Encounter (Signed)
Duplicate medication refill request

## 2023-10-30 ENCOUNTER — Ambulatory Visit: Payer: Medicare (Managed Care) | Admitting: Dietician

## 2023-10-30 VITALS — Wt 187.0 lb

## 2023-10-30 DIAGNOSIS — E1151 Type 2 diabetes mellitus with diabetic peripheral angiopathy without gangrene: Secondary | ICD-10-CM | POA: Diagnosis not present

## 2023-10-30 DIAGNOSIS — Z794 Long term (current) use of insulin: Secondary | ICD-10-CM

## 2023-10-30 NOTE — Progress Notes (Signed)
 11 am 16 units , was 78 cut bushes, checked it was 170-180     BP Readings from Last 3 Encounters:  08/29/23 124/70  08/01/23 (!) 151/56  06/12/23 126/63    Wt Readings from Last 10 Encounters:  10/30/23 187 lb (84.8 kg)  08/29/23 185 lb (83.9 kg)  08/01/23 187 lb 3.2 oz (84.9 kg)  06/12/23 186 lb (84.4 kg)  06/11/23 188 lb (85.3 kg)  05/25/23 185 lb 6.4 oz (84.1 kg)  04/11/23 189 lb 11.2 oz (86 kg)  03/12/23 188 lb 3.2 oz (85.4 kg)  02/22/23 190 lb (86.2 kg)  02/14/23 188 lb 1.6 oz (85.3 kg)   Lab Results  Component Value Date   HGBA1C 8.6 (A) 08/01/2023   HGBA1C 8.3 (A) 05/25/2023   HGBA1C 8.2 (A) 01/01/2023   HGBA1C 8.7 (A) 09/26/2022   HGBA1C 9.8 (A) 08/03/2022    Diabetes Self-Management Education  Visit Type: Annual Follow-Up  Appt. Start Time: 115 Appt. End Time: 1400  10/30/2023  [name], identified by name and date of birth, is a 73 y.o. male with a diagnosis of Diabetes:  .   ASSESSMENT  Weight 187 lb (84.8 kg). Body mass index is 24.67 kg/m.   Diabetes Self-Management Education - 10/30/23 1400       Visit Information   Visit Type Annual Follow-Up      Health Coping   How would you rate your overall health? Good      Psychosocial Assessment   Patient Belief/Attitude about Diabetes Motivated to manage diabetes    What is the hardest part about your diabetes right now, causing you the most concern, or is the most worrisome to you about your diabetes?   Other (comment)   vascular issues   Self-care barriers Lack of material resources    Self-management support CDE visits;Doctor's office    Patient Concerns Glycemic Control   thought his reader was not working, but reader test came out fine today   Special Needs None    Preferred Learning Style No preference indicated    Learning Readiness Ready    How often do you need to have someone help you when you read instructions, pamphlets, or other written materials from your doctor or pharmacy?  2 - Rarely    What is the last grade level you completed in school? 12      Pre-Education Assessment   Patient understands the diabetes disease and treatment process. Comprehends key points    Patient understands incorporating nutritional management into lifestyle. Needs Review    Patient undertands incorporating physical activity into lifestyle. Comprehends key points    Patient understands using medications safely. Needs Review    Patient understands monitoring blood glucose, interpreting and using results Comprehends key points    Patient understands prevention, detection, and treatment of acute complications. Comprehends key points    Patient understands prevention, detection, and treatment of chronic complications. Compreheands key points    Patient understands how to develop strategies to address psychosocial issues. Comprehends key points    Patient understands how to develop strategies to promote health/change behavior. Comprehends key points      Complications   Last HgB A1C per patient/outside source 8.6 %    How often do you check your blood sugar? > 4 times/day    Fasting Blood glucose range (mg/dL) <29;29-870;869-820;819-799;>799    Postprandial Blood glucose range (mg/dL) 819-799;>799    Number of hypoglycemic episodes per month 3    Can you  tell when your blood sugar is low? No    Number of hyperglycemic episodes ( >200mg /dL): Daily    Can you tell when your blood sugar is high? No    Have you had a dilated eye exam in the past 12 months? Yes    Have you had a dental exam in the past 12 months? --   need to assess   Are you checking your feet? Yes    How many days per week are you checking your feet? 7      Dietary Intake   Breakfast 730 am Oatmeal 2 packs, milk made with butter 1 cup coffee, non dairy creamer zero sugar    Lunch 1215 PM Spaghettios 1 can and crackers 3-4, water, diet zero mt dew    Beverage(s) water or diet soda, coffee      Activity / Exercise    Activity / Exercise Type Light (walking / raking leaves);ADL's    How many days per week do you exercise? 4    How many minutes per day do you exercise? 30    Total minutes per week of exercise 120      Patient Education   Previous Diabetes Education Yes (please comment)   here   Healthy Eating Carbohydrate counting   not so much counting but helped him become more aware of carbs content of what he is eating   Medications Reviewed patients medication for diabetes, action, purpose, timing of dose and side effects.   he had not decreased his Toujeo  per Dr. Sam, we discussed this was intended to prevent low blood sugars. discussed with Dr. Rosan today who decreased his Toujeo  to 22 units because he still had hypoglycemia last night into this morning.     Individualized Goals (developed by patient)   Medications take my medication as prescribed      Patient Self-Evaluation of Goals - Patient rates self as meeting previously set goals (% of time)   Reducing Risk (treating acute and chronic complications) >75% (most of the time)      Post-Education Assessment   Patient understands incorporating nutritional management into lifestyle. Comprehends key points    Patient understands using medications safely. Comphrehends key points      Outcomes   Expected Outcomes Demonstrated interest in learning. Expect positive outcomes    Future DMSE 3-4 months   by phone at end of week and in office provcided no hypoglycemia 3 months   Program Status Completed      Subsequent Visit   Since your last visit have you continued or begun to take your medications as prescribed? No   has stopped most of his diabetes medicines for last two days due to hypoglycemia overnight without symptoms. In office today, his blood sugar was >300 after eating twice today and taking 30 units Humalog  before each meal.   Since your last visit have you had your blood pressure checked? Yes    Is your most recent blood pressure  lower, unchanged, or higher since your last visit? Lower    Since your last visit have you experienced any weight changes? No change    Since your last visit, are you checking your blood glucose at least once a day? Yes   had problems downloading his reader: noted his avcerages were in 200s with low blood sugars overnight the past two nights, and the ened of may had two hypoglycemic events as well but not in between those times. He states he is  eating a lot, hungry often.         Individualized Plan for Diabetes Self-Management Training:   Learning Objective:  Patient will have a greater understanding of diabetes self-management. Patient education plan is to attend individual and/or group sessions per assessed needs and concerns.   Plan:   Patient Instructions   Per dr. Sam:  Take Mounjaro  7.5 mg weekly  Continue Pioglitazone  30 mg daily  Continue Jardiance  25 mg, 1 tablet every morning  daily  Continue  Humalog  15 clicks with each meal      Restart Toujeo  at 22 units per Dr. Rosan.  Okay to take Humalog  via Cecur as you are.   You are due to see Internal Medicine doctor late September 2025.  Your follow up with me is January 30, 2024  Nathan [name] [phone] (new number)       Expected Outcomes:  Demonstrated interest in learning. Expect positive outcomes  Education material provided: Diabetes Resources  If problems or questions, patient to contact team via:  Phone  Future DSME appointment: 3-4 months (by phone at end of week and in office provcided no hypoglycemia 3 months) Nathan [name] Hole, RD 10/30/2023 4:28 PM.

## 2023-10-30 NOTE — Patient Instructions (Addendum)
 Per Dr. Sam:  Take Mounjaro  7.5 mg weekly  Continue Pioglitazone  30 mg daily  Continue Jardiance  25 mg, 1 tablet every morning  daily  Continue  Humalog  15 clicks with each meal      Restart Toujeo  at 22 units per Dr. Rosan.  Okay to take Humalog  via Cecur as you are.   You are due to see Internal Medicine doctor late September 2025.  Your follow up with me is January 30, 2024  Nathan [name] [phone] (new number)

## 2023-11-01 ENCOUNTER — Telehealth: Payer: Self-pay

## 2023-11-01 ENCOUNTER — Encounter (HOSPITAL_COMMUNITY): Payer: Self-pay

## 2023-11-01 ENCOUNTER — Ambulatory Visit: Payer: Medicare (Managed Care) | Admitting: Dietician

## 2023-11-01 ENCOUNTER — Ambulatory Visit (HOSPITAL_COMMUNITY)
Admission: EM | Admit: 2023-11-01 | Discharge: 2023-11-01 | Disposition: A | Discharge status: Home / Self Care | Payer: Medicare (Managed Care) | Attending: Emergency Medicine | Admitting: Emergency Medicine

## 2023-11-01 DIAGNOSIS — R369 Urethral discharge, unspecified: Secondary | ICD-10-CM | POA: Insufficient documentation

## 2023-11-01 DIAGNOSIS — Z202 Contact with and (suspected) exposure to infections with a predominantly sexual mode of transmission: Secondary | ICD-10-CM | POA: Insufficient documentation

## 2023-11-01 DIAGNOSIS — L0201 Cutaneous abscess of face: Secondary | ICD-10-CM | POA: Insufficient documentation

## 2023-11-01 DIAGNOSIS — T148XXA Other injury of unspecified body region, initial encounter: Secondary | ICD-10-CM | POA: Diagnosis not present

## 2023-11-01 MED ORDER — PENICILLIN G BENZATHINE 1200000 UNIT/2ML IM SUSY
PREFILLED_SYRINGE | INTRAMUSCULAR | Status: AC
  Filled 2023-11-01: qty 2

## 2023-11-01 MED ORDER — PENICILLIN G BENZATHINE 1200000 UNIT/2ML IM SUSY
2.4000 10*6.[IU] | PREFILLED_SYRINGE | Freq: Once | INTRAMUSCULAR | Status: AC
  Administered 2023-11-01: 2.4 10*6.[IU] via INTRAMUSCULAR

## 2023-11-01 MED ORDER — DOXYCYCLINE HYCLATE 100 MG PO CAPS
100.0000 mg | ORAL_CAPSULE | Freq: Two times a day (BID) | ORAL | 0 refills | Status: DC
Start: 2023-11-01 — End: 2023-11-23

## 2023-11-01 MED ORDER — MUPIROCIN 2 % EX OINT: 1.0000 | TOPICAL_OINTMENT | Freq: Two times a day (BID) | CUTANEOUS | 0 refills | Status: DC

## 2023-11-01 NOTE — Telephone Encounter (Signed)
 Patient called office today requesting appt to be seen today. States that he has multiple concerns and feels like they are related to possible exposure to syphillis. Is c/o rash, jaw swelling, penile soreness.  Was told by partner that he was treated last week. Would like to come in for testing/ treatment today. Informed patient that we do not have any open appts and he should either contact health department, PCP, or visit Urgent care for this.  Verbalized understanding. Lorenda CHRISTELLA Code, RMA

## 2023-11-01 NOTE — Discharge Instructions (Addendum)
 You were given penicillin  injections in clinic today to treat for syphilis exposure. Start taking doxycycline  twice daily for 10 days for coverage of chlamydia as well as coverage for your facial abscess. Apply warm compresses to the abscess on your face to help decrease swelling and promote drainage. Apply mupirocin  ointment twice daily to the area on your inner thigh for infection prevention. Your results will return over the next few days and someone will call if results are positive and require any additional treatment. Otherwise follow-up with your infectious disease provider.  Return here as needed.

## 2023-11-01 NOTE — Progress Notes (Signed)
 Call to patient to follow up on blood sugars with adjusted insulin  doses. No answer. Left voicemail for return call. Second call and message for patient to return call and leave detailed messege about how much [place] he is taking and what his overnight blood sugars have been since we met two days ago.

## 2023-11-01 NOTE — ED Triage Notes (Signed)
 Pt states he had a partner recently tell him he had already been treated for syphilis and he should get tested. Pt states he has swelling at the tip of his penis and intermittent discharge. Patient also c/o right sided facial swelling that he is unsure if it is related.

## 2023-11-01 NOTE — ED Provider Notes (Signed)
 [facility]    CSN: [id number] Arrival date & time: 11/01/23  1540      History   Chief Complaint Chief Complaint  Patient presents with   SEXUALLY TRANSMITTED DISEASE    HPI Nathan [name] is a 73 y.o. male.   Patient presents requesting testing and treatment for syphilis.  Patient states that his partner told him today that he tested positive for syphilis and recommended that he come get testing.  Patient states that he has had some mild irritation to the tip of his penis as well as some intermittent penile discharge for about a month.  Patient denies any lesions to his penis.  Denies dysuria, hematuria, urinary frequency/urgency, flank pain, fever, and abdominal pain.  Patient does have a history of HIV and has had syphilis in the past as well.  Patient does see infectious disease and takes Biktarvy .  Patient also states that he noticed an area of swelling to the right side of his face about 2 days ago.  Patient states the area of swelling has become larger over the last few days.  Patient denies any dental pain.  Patient states that he did shave his face a few days ago and nicked his face and then he picked at this area and woke up the next day with swelling.  Patient also states that he has an area to his right upper thigh from where his prosthetic leg has been rubbing.  Patient states there is an open wound to this area.  Patient denies any purulent drainage from this area.  Patient states that area is approximately the size of a match head.  The history is provided by the patient and medical records.    Past Medical History:  Diagnosis Date   Asthma    per 2003 [facility] pulm records pfts    Blood dyscrasia    HIV   Carotid artery occlusion    Clotting disorder (HCC)    Colon polyps    noted previous colonoscopy [facility]   DDD (degenerative disc disease)    cervical spine   Depression    Diabetes mellitus    dx 2010   Diarrhea 02/24/2021   Dizziness 08/25/2021    Falls 08/25/2021   Fever    unknwon origin   GERD (gastroesophageal reflux disease)    Head swelling 05/23/2017   Hep C w/o coma, chronic (HCC)    History of syphilis    noted [facility] records   HIV infection (HCC)    undetectable viral load and CD4 ct 667 as of 11/2011   Hyperlipidemia    Hypertension    MRSA (methicillin resistant Staphylococcus aureus)    Multiple sclerosis (HCC)    in remission as of 11/2011 (diagnosed late 1980s)   Pain in limb-Right Leg 10/13/2013   Pancreas (digestive gland) works poorly    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HCC)    2002   Pneumonia    Prosthesis fitting 03/10/2019   PVD (peripheral vascular disease) (HCC)    Left Stent 07/02/2008   Scalp lesion 07/26/2017   UTI (urinary tract infection)    Wears dentures     Patient Active Problem List   Diagnosis Date Noted   Carotid artery stenosis 02/22/2023   Carotid stenosis, asymptomatic, left 02/22/2023   Chronic diarrhea 06/20/2022   Antiplatelet or antithrombotic long-term use 06/20/2022   Pancreatic insufficiency 06/04/2022   Dyslipidemia 09/12/2021   Tobacco use disorder, moderate, in sustained remission 08/30/2020   Type 2  DM with diabetic peripheral angiopathy w/o gangrene (HCC) 02/19/2019   History of syphilis 10/09/2013   Atherosclerosis of native artery of extremity with intermittent claudication (HCC) 04/08/2012   Occlusion and stenosis of carotid artery without mention of cerebral infarction 12/11/2011   Healthcare maintenance 11/23/2011   Benign essential HTN 02/13/2008   Hyperlipidemia associated with type 2 diabetes mellitus (HCC) 07/29/2007   HIV disease (HCC) 02/12/2006   COLONIC POLYPS, ADENOMATOUS 02/12/2006   Multiple sclerosis (HCC) 02/12/2006   PERIPHERAL VASCULAR DISEASE 02/12/2006   GERD 02/12/2006   Hepatitis B core antibody positive 02/12/2006    Past Surgical History:  Procedure Laterality Date   ABDOMINAL AORTOGRAM W/LOWER EXTREMITY N/A 08/07/2017    Procedure: ABDOMINAL AORTOGRAM W/LOWER EXTREMITY;  Surgeon: Serene Gaile ORN, MD;  Location: [facility];  Service: Cardiovascular;  Laterality: N/A;   ABOVE KNEE LEG AMPUTATION  2009   Right Leg   COLONOSCOPY W/ BIOPSIES AND POLYPECTOMY     ENDARTERECTOMY Left 02/22/2023   Procedure: LEFT ENDARTERECTOMY CAROTID;  Surgeon: Serene Gaile ORN, MD;  Location: [facility] OR;  Service: Vascular;  Laterality: Left;   LOWER EXTREMITY ANGIOGRAM Left 12/26/2011   Procedure: LOWER EXTREMITY ANGIOGRAM;  Surgeon: Gaile ORN Serene, MD;  Location: [facility] CATH LAB;  Service: Cardiovascular;  Laterality: Left;   LOWER EXTREMITY ANGIOGRAM Left 08/17/2017   Procedure: LOWER EXTREMITY ANGIOGRAM LEFT LEG WITH RUNOFF AND Stenting.;  Surgeon: Serene Gaile ORN, MD;  Location: [facility] OR;  Service: Vascular;  Laterality: Left;   MULTIPLE TOOTH EXTRACTIONS     OTHER SURGICAL HISTORY     left left with stents (Dr. Celso)   OTHER SURGICAL HISTORY     2003 colonoscopy 5 mm polyp transverse colon; (2)65mm  polyps in rectum-hyperplastic   PATCH ANGIOPLASTY Left 02/22/2023   Procedure: PATCH ANGIOPLASTY 1CMx14CM GEORGE LISLE;  Surgeon: Serene Gaile ORN, MD;  Location: [facility] OR;  Service: Vascular;  Laterality: Left;   PERIPHERAL VASCULAR INTERVENTION Left 08/17/2017   Procedure: POPLITEAL STENT;  Surgeon: Serene Gaile ORN, MD;  Location: [facility] OR;  Service: Vascular;  Laterality: Left;       Home Medications    Prior to Admission medications   Medication Sig Start Date End Date Taking? Authorizing Provider  doxycycline  (VIBRAMYCIN ) 100 MG capsule Take 1 capsule (100 mg total) by mouth 2 (two) times daily. 11/01/23  Yes Johnie, Jauna Raczynski A, NP  mupirocin  ointment (BACTROBAN ) 2 % Apply 1 Application topically 2 (two) times daily. 11/01/23  Yes Johnie, Kalleigh Harbor A, NP  atorvastatin  (LIPITOR) 40 MG tablet TAKE 1 TABLET(40 MG) BY MOUTH DAILY 06/04/23   Shamleffer, Ibtehal Jaralla, MD  bictegravir-emtricitabine -tenofovir  AF  (BIKTARVY ) 50-200-25 MG TABS tablet Take 1 tablet by mouth daily. 06/12/23   Fleeta Kathie Jomarie LOISE, MD  Blood Glucose Monitoring Suppl (FREESTYLE LITE) w/Device KIT 1 Device by Does not apply route daily in the afternoon. 03/28/22   Shamleffer, Ibtehal Jaralla, MD  cilostazol  (PLETAL ) 50 MG tablet TAKE 1 TABLET(50 MG) BY MOUTH TWICE DAILY 10/29/23   Nooruddin, Saad, MD  clopidogrel  (PLAVIX ) 75 MG tablet TAKE 1 TABLET(75 MG) BY MOUTH DAILY 03/22/23   Nooruddin, Roetta, MD  Continuous Glucose Sensor (FREESTYLE LIBRE 3 PLUS SENSOR) MISC Change sensor every 15 days. 05/25/23   Shamleffer, Ibtehal Jaralla, MD  empagliflozin  (JARDIANCE ) 25 MG TABS tablet Take 1 tablet (25 mg total) by mouth daily. 05/25/23   Shamleffer, Ibtehal Jaralla, MD  ezetimibe  (ZETIA ) 10 MG tablet TAKE 1 TABLET(10 MG) BY MOUTH DAILY 05/07/23  Nooruddin, Saad, MD  glucose blood (FREESTYLE LITE) test strip Use as instructed 01/10/23   Nooruddin, Saad, MD  glucose blood (FREESTYLE LITE) test strip Use 5 times daily to check blood sugar. 04/11/23   Gregary Sharper, MD  HUMALOG  100 UNIT/ML injection Max daily 110 units per CeQur 05/25/23   Shamleffer, Ibtehal Jaralla, MD  ibuprofen  (ADVIL ) 200 MG tablet Take 600 mg by mouth every 6 (six) hours as needed for moderate pain.    [provider]  injection device for insulin  (CEQUR SIMPLICITY 2U) DEVI change every 2 days 01/31/23   Shamleffer, Ibtehal Jaralla, MD  Injection Device for Insulin  (CEQUR SIMPLICITY INSERTER) MISC Use to Apply a new cecur simplicity patch as directed every 2 days 07/18/22   Rudy Sieving, MD  insulin  glargine, 2 Unit Dial, (TOUJEO  MAX SOLOSTAR) 300 UNIT/ML Solostar Pen Inject 45 Units into the skin in the morning. 08/29/23   Shamleffer, Ibtehal Jaralla, MD  Insulin  Pen Needle (BD PEN NEEDLE NANO U/F) 32G X 4 MM MISC 1 Device by Other route daily in the afternoon. USE AS DIRECTED FOUR TIMES DAILY. Dx code  E11.42 05/25/23   Shamleffer, Ibtehal Jaralla, MD  Insulin   Syringe-Needle U-100 (GNP INSULIN  SYRINGES) 30G X 5/16 1 ML MISC 1 Device by Does not apply route daily in the afternoon. 09/26/22   Shamleffer, Donell Cardinal, MD  Lancet Devices (BAYER MICROLET 2 LANCING [facility]) MISC Use to check blood sugar up to 3 ties a day 04/08/15   Prentiss Prentice SAILOR, DO  Lancets (FREESTYLE) lancets Use 5 times daily to check blood sugar. 07/19/21   Gawaluck, Greylon, MD  lipase/protease/amylase (CREON ) 36000 UNITS CPEP capsule TAKE 1 CAPSULE BY MOUTH THREE TIMES DAILY BEFORE A MEAL 10/16/23   Nooruddin, Saad, MD  lisinopril  (ZESTRIL ) 40 MG tablet TAKE 1 TABLET BY MOUTH DAILY. HOLD IF SYSTOLIC BLOOD PRESSURE IS LESS THAN 110 01/15/23   Nooruddin, Roetta, MD  omeprazole  (PRILOSEC) 40 MG capsule TAKE 1 CAPSULE(40 MG) BY MOUTH DAILY 08/28/23   Nooruddin, Saad, MD  oxyCODONE  (OXY IR/ROXICODONE ) 5 MG immediate release tablet Take 1 tablet (5 mg total) by mouth every 6 (six) hours as needed for severe pain (pain score 7-10) or moderate pain (pain score 4-6). 03/12/23   Schuh, McKenzi P, PA-C  pioglitazone  (ACTOS ) 30 MG tablet TAKE 1 TABLET(30 MG) BY MOUTH DAILY 10/08/23   Shamleffer, Ibtehal Jaralla, MD  tirzepatide  (MOUNJARO ) 7.5 MG/0.5ML Pen Inject 7.5 mg into the skin once a week. 08/29/23   Shamleffer, Donell Cardinal, MD    Family History Family History  Problem Relation Age of Onset   Diabetes Mother    Coronary artery disease Mother    Coronary artery disease Father    Liver disease Sister    Cancer Brother        colon caner stage 4 as of 11/2011 (unknown age of onset)   Prostate cancer Brother    Colon cancer Brother    Rectal cancer Neg Hx    Esophageal cancer Neg Hx     Social History Social History   Tobacco Use   Smoking status: Former    Current packs/day: 0.00    Types: Cigarettes    Quit date: 1967    Years since quitting: 58.5    Passive exposure: Never   Smokeless tobacco: Former    Types: Chew    Quit date: 1980  Vaping Use   Vaping status: Never Used   Substance Use Topics   Alcohol use: No  Alcohol/week: 0.0 standard drinks of alcohol   Drug use: No     Allergies   Sulfonamide derivatives   Review of Systems Review of Systems  Per HPI  Physical Exam Triage Vital Signs ED Triage Vitals  Encounter Vitals Group     BP 11/01/23 1724 (!) 181/79     Girls Systolic BP Percentile --      Girls Diastolic BP Percentile --      Boys Systolic BP Percentile --      Boys Diastolic BP Percentile --      Pulse Rate 11/01/23 1724 87     Resp 11/01/23 1724 20     Temp 11/01/23 1724 98.4 F (36.9 C)     Temp Source 11/01/23 1724 Oral     SpO2 11/01/23 1724 94 %     Weight --      Height --      Head Circumference --      Peak Flow --      Pain Score 11/01/23 1725 8     Pain Loc --      Pain Education --      Exclude from Growth Chart --    No data found.  Updated Vital Signs BP (!) 181/79 (BP Location: Left Arm)   Pulse 87   Temp 98.4 F (36.9 C) (Oral)   Resp 20   SpO2 94%   Visual Acuity Right Eye Distance:   Left Eye Distance:   Bilateral Distance:    Right Eye Near:   Left Eye Near:    Bilateral Near:     Physical Exam Vitals and nursing note reviewed.  Constitutional:      General: He is awake. He is not in acute distress.    Appearance: Normal appearance. He is well-developed and well-groomed. He is not ill-appearing.  Genitourinary:    Comments: Exam deferred  Skin:    General: Skin is warm and dry.     Findings: Abscess and wound present.     Comments: Approximately 4 cm in diameter indurated abscess noted to the right lower jaw.  There is also a small open wound approximately 1 mm in diameter noted to his right inner thigh.  Without any signs of infection.   Neurological:     Mental Status: He is alert.   Psychiatric:        Behavior: Behavior is cooperative.      UC Treatments / Results  Labs (all labs ordered are listed, but only abnormal results are displayed) Labs Reviewed  RPR   CYTOLOGY, (ORAL, ANAL, URETHRAL) ANCILLARY ONLY    EKG   Radiology No results found.  Procedures Procedures (including critical care time)  Medications Ordered in UC Medications  penicillin  g benzathine (BICILLIN  LA) 1200000 UNIT/2ML injection 2.4 Million Units (has no administration in time range)    Initial Impression / Assessment and Plan / UC Course  I have reviewed the triage vital signs and the nursing notes.  Pertinent labs & imaging results that were available during my care of the patient were reviewed by me and considered in my medical decision making (see chart for details).     Patient is overall well-appearing.  Vitals are stable.  Empirically treating in clinic with Bicillin  due to exposure to syphilis.  Ordered RPR.  GU exam deferred.  Patient perform self swab for STD.  Empirically treating with doxycycline  for chlamydia due to penile discharge.  Doxycycline  should also cover for infection related to the  facial abscess.  Recommended patient continue taking this despite his results related to the penile swelling.  Prescribed mupirocin  to apply to the open wounds on his inner thigh for infection prevention.  Discussed follow-up and return precautions. Final Clinical Impressions(s) / UC Diagnoses   Final diagnoses:  Exposure to syphilis  Penile discharge  Facial abscess  Open wound     Discharge Instructions      You were given penicillin  injections in clinic today to treat for syphilis exposure. Start taking doxycycline  twice daily for 10 days for coverage of chlamydia as well as coverage for your facial abscess. Apply warm compresses to the abscess on your face to help decrease swelling and promote drainage. Apply mupirocin  ointment twice daily to the area on your inner thigh for infection prevention. Your results will return over the next few days and someone will call if results are positive and require any additional treatment. Otherwise follow-up  with your infectious disease provider.  Return here as needed.     ED Prescriptions     Medication Sig Dispense Auth. Provider   doxycycline  (VIBRAMYCIN ) 100 MG capsule Take 1 capsule (100 mg total) by mouth 2 (two) times daily. 20 capsule Johnie Flaming A, NP   mupirocin  ointment (BACTROBAN ) 2 % Apply 1 Application topically 2 (two) times daily. 22 g Johnie Flaming A, NP      PDMP not reviewed this encounter.   Johnie Flaming A, NP 11/01/23 [phone]

## 2023-11-02 ENCOUNTER — Other Ambulatory Visit: Payer: Self-pay | Admitting: Internal Medicine

## 2023-11-02 LAB — CYTOLOGY, (ORAL, ANAL, URETHRAL) ANCILLARY ONLY
Chlamydia: NEGATIVE
Comment: NEGATIVE
Comment: NEGATIVE
Comment: NORMAL
Neisseria Gonorrhea: NEGATIVE
Trichomonas: NEGATIVE

## 2023-11-02 LAB — RPR: RPR Ser Ql: NONREACTIVE

## 2023-11-21 ENCOUNTER — Ambulatory Visit: Payer: Self-pay

## 2023-11-21 NOTE — Telephone Encounter (Signed)
 FYI Only or Action Required?: FYI only for provider.  Patient was last seen in primary care on 08/01/2023 by Nooruddin, Saad, MD.  Called Nurse Triage reporting Abscess.  Symptoms began several weeks ago.  Interventions attempted: OTC medications: ibuprofen  and Ice/heat application and patient states he attempted drainage by inserting a needle into wound a few times  Symptoms are: right elbow swelling, redness, warmth and bloody/pus drainagegradually worsening.  Triage Disposition: See Physician Within 24 Hours  Patient/caregiver understands and will follow disposition?: Yes           Copied from CRM [id number]. Topic: Clinical - Red Word Triage >> Nov 21, 2023  2:42 PM Laurier C wrote: Red Word that prompted transfer to Nurse Triage: Patient believes he may have an infection in his arm  due to a possible cyst underneath his elbow (right arm). Pain level 5 Reason for Disposition  Boil overlying a joint  Answer Assessment - Initial Assessment Questions Patient states it is painful but he has full ROM of right arm and elbow.  1. APPEARANCE of BOIL: What does the boil look like?      Swelling, redness, warmth.  2. LOCATION: Where is the boil located?      Right elbow.  3. NUMBER: How many boils are there?      1.  4. SIZE: How big is the boil? (e.g., inches, cm; compare to size of a coin or other object)     He states the entire elbow is swollen and he is unsure how large the abscess.  5. ONSET: When did the boil start?     2-3 weeks.  6. PAIN: Is there any pain? If Yes, ask: How bad is the pain?   (Scale 1-10; or mild, moderate, severe)     6-7/10 at present, he states at night it is worse. Treating with ibuprofen  and hot towels.  7. FEVER: Do you have a fever? If Yes, ask: What is it, how was it measured, and when did it start?      Yes, he states he doesn't check his temperature.  8. SOURCE: Have you been around anyone with boils or other Staph  infections? Have you ever had boils before?     He was treated with a 10 day course of antibiotics for a boil on his face 3 weeks ago (urgent care).  9. OTHER SYMPTOMS: Do you have any other symptoms? (e.g., shaking chills, weakness, rash elsewhere on body)     Chills, small amount of drainage from right elbow (he states he attempted drainage a few times with a  needle and got pus and blood out).  10. PREGNANCY: Is there any chance you are pregnant? When was your last menstrual period?       N/a.  Protocols used: Boil (Skin Abscess)-A-AH

## 2023-11-22 ENCOUNTER — Ambulatory Visit: Payer: Medicare (Managed Care) | Admitting: Student

## 2023-11-22 ENCOUNTER — Encounter (HOSPITAL_COMMUNITY): Payer: Self-pay

## 2023-11-22 ENCOUNTER — Other Ambulatory Visit: Payer: Self-pay

## 2023-11-22 ENCOUNTER — Observation Stay (HOSPITAL_COMMUNITY)
Admission: EM | Admit: 2023-11-22 | Discharge: 2023-11-24 | Disposition: A | Discharge status: Home / Self Care | Payer: Medicare (Managed Care) | Attending: Internal Medicine | Admitting: Internal Medicine

## 2023-11-22 ENCOUNTER — Emergency Department (HOSPITAL_COMMUNITY): Payer: Medicare (Managed Care)

## 2023-11-22 VITALS — BP 157/70 | HR 89 | Temp 98.3°F | Ht 73.0 in | Wt 184.4 lb

## 2023-11-22 DIAGNOSIS — N179 Acute kidney failure, unspecified: Secondary | ICD-10-CM | POA: Diagnosis not present

## 2023-11-22 DIAGNOSIS — L03113 Cellulitis of right upper limb: Principal | ICD-10-CM | POA: Diagnosis present

## 2023-11-22 DIAGNOSIS — K219 Gastro-esophageal reflux disease without esophagitis: Secondary | ICD-10-CM | POA: Insufficient documentation

## 2023-11-22 DIAGNOSIS — E785 Hyperlipidemia, unspecified: Secondary | ICD-10-CM | POA: Diagnosis not present

## 2023-11-22 DIAGNOSIS — K8689 Other specified diseases of pancreas: Secondary | ICD-10-CM | POA: Diagnosis present

## 2023-11-22 DIAGNOSIS — B20 Human immunodeficiency virus [HIV] disease: Secondary | ICD-10-CM | POA: Diagnosis present

## 2023-11-22 DIAGNOSIS — I739 Peripheral vascular disease, unspecified: Secondary | ICD-10-CM | POA: Insufficient documentation

## 2023-11-22 DIAGNOSIS — E1151 Type 2 diabetes mellitus with diabetic peripheral angiopathy without gangrene: Secondary | ICD-10-CM | POA: Diagnosis not present

## 2023-11-22 DIAGNOSIS — Z794 Long term (current) use of insulin: Secondary | ICD-10-CM | POA: Diagnosis not present

## 2023-11-22 DIAGNOSIS — K8681 Exocrine pancreatic insufficiency: Secondary | ICD-10-CM | POA: Insufficient documentation

## 2023-11-22 DIAGNOSIS — R2231 Localized swelling, mass and lump, right upper limb: Secondary | ICD-10-CM | POA: Diagnosis present

## 2023-11-22 DIAGNOSIS — I10 Essential (primary) hypertension: Secondary | ICD-10-CM | POA: Diagnosis present

## 2023-11-22 DIAGNOSIS — E1169 Type 2 diabetes mellitus with other specified complication: Secondary | ICD-10-CM | POA: Diagnosis present

## 2023-11-22 LAB — CBC WITH DIFFERENTIAL/PLATELET
Abs Immature Granulocytes: 0.04 K/uL (ref 0.00–0.07)
Basophils Absolute: 0 K/uL (ref 0.0–0.1)
Basophils Relative: 0 %
Eosinophils Absolute: 0.1 K/uL (ref 0.0–0.5)
Eosinophils Relative: 1 %
HCT: 43.7 % (ref 39.0–52.0)
Hemoglobin: 14.2 g/dL (ref 13.0–17.0)
Immature Granulocytes: 0 %
Lymphocytes Relative: 26 %
Lymphs Abs: 2.3 K/uL (ref 0.7–4.0)
MCH: 30.1 pg (ref 26.0–34.0)
MCHC: 32.5 g/dL (ref 30.0–36.0)
MCV: 92.8 fL (ref 80.0–100.0)
Monocytes Absolute: 0.7 K/uL (ref 0.1–1.0)
Monocytes Relative: 8 %
Neutro Abs: 5.8 K/uL (ref 1.7–7.7)
Neutrophils Relative %: 65 %
Platelets: 301 K/uL (ref 150–400)
RBC: 4.71 MIL/uL (ref 4.22–5.81)
RDW: 13.9 % (ref 11.5–15.5)
WBC: 9 K/uL (ref 4.0–10.5)
nRBC: 0 % (ref 0.0–0.2)

## 2023-11-22 LAB — COMPREHENSIVE METABOLIC PANEL WITH GFR
ALT: 14 U/L (ref 0–44)
AST: 16 U/L (ref 15–41)
Albumin: 3.4 g/dL — ABNORMAL LOW (ref 3.5–5.0)
Alkaline Phosphatase: 85 U/L (ref 38–126)
Anion gap: 11 (ref 5–15)
BUN: 14 mg/dL (ref 8–23)
CO2: 22 mmol/L (ref 22–32)
Calcium: 9.2 mg/dL (ref 8.9–10.3)
Chloride: 102 mmol/L (ref 98–111)
Creatinine, Ser: 1.07 mg/dL (ref 0.61–1.24)
GFR, Estimated: >60 mL/min (ref >60)
Glucose, Bld: 202 mg/dL — ABNORMAL HIGH (ref 70–99)
Potassium: 3.6 mmol/L (ref 3.5–5.1)
Sodium: 135 mmol/L (ref 135–145)
Total Bilirubin: 0.8 mg/dL (ref 0.0–1.2)
Total Protein: 7.4 g/dL (ref 6.5–8.1)

## 2023-11-22 LAB — I-STAT CG4 LACTIC ACID, ED: Lactic Acid, Venous: 0.7 mmol/L (ref 0.5–1.9)

## 2023-11-22 NOTE — Progress Notes (Signed)
 CC: Right elbow pain  HPI:  [name] is a 73 y.o. male living with a history stated below and presents today for right elbow pain. Please see problem based assessment and plan for additional details.  Past Medical History:  Diagnosis Date   Asthma    per 2003 [facility] pulm records pfts    Blood dyscrasia    HIV   Carotid artery occlusion    Clotting disorder (HCC)    Colon polyps    noted previous colonoscopy [facility]   DDD (degenerative disc disease)    cervical spine   Depression    Diabetes mellitus    dx 2010   Diarrhea 02/24/2021   Dizziness 08/25/2021   Falls 08/25/2021   Fever    unknwon origin   GERD (gastroesophageal reflux disease)    Head swelling 05/23/2017   Hep C w/o coma, chronic (HCC)    History of syphilis    noted [facility] records   HIV infection (HCC)    undetectable viral load and CD4 ct 667 as of 11/2011   Hyperlipidemia    Hypertension    MRSA (methicillin resistant Staphylococcus aureus)    Multiple sclerosis (HCC)    in remission as of 11/2011 (diagnosed late 1980s)   Pain in limb-Right Leg 10/13/2013   Pancreas (digestive gland) works poorly    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HCC)    2002   Pneumonia    Prosthesis fitting 03/10/2019   PVD (peripheral vascular disease) (HCC)    Left Stent 07/02/2008   Scalp lesion 07/26/2017   UTI (urinary tract infection)    Wears dentures     Current Outpatient Medications on File Prior to Visit  Medication Sig Dispense Refill   atorvastatin  (LIPITOR) 40 MG tablet TAKE 1 TABLET(40 MG) BY MOUTH DAILY 90 tablet 1   bictegravir-emtricitabine -tenofovir  AF (BIKTARVY ) 50-200-25 MG TABS tablet Take 1 tablet by mouth daily. 30 tablet 11   Blood Glucose Monitoring Suppl (FREESTYLE LITE) w/Device KIT 1 Device by Does not apply route daily in the afternoon. 1 kit 0   cilostazol  (PLETAL ) 50 MG tablet TAKE 1 TABLET(50 MG) BY MOUTH TWICE DAILY 60 tablet 3   clopidogrel  (PLAVIX ) 75 MG tablet TAKE 1  TABLET(75 MG) BY MOUTH DAILY 90 tablet 3   Continuous Glucose Sensor (FREESTYLE LIBRE 3 PLUS SENSOR) MISC Change sensor every 15 days. 6 each 3   doxycycline  (VIBRAMYCIN ) 100 MG capsule Take 1 capsule (100 mg total) by mouth 2 (two) times daily. 20 capsule 0   empagliflozin  (JARDIANCE ) 25 MG TABS tablet Take 1 tablet (25 mg total) by mouth daily. 90 tablet 3   ezetimibe  (ZETIA ) 10 MG tablet TAKE 1 TABLET(10 MG) BY MOUTH DAILY 90 tablet 2   glucose blood (FREESTYLE LITE) test strip Use as instructed 100 each 12   glucose blood (FREESTYLE LITE) test strip Use 5 times daily to check blood sugar. 450 each 3   HUMALOG  100 UNIT/ML injection Max daily 110 units per CeQur 110 mL 3   ibuprofen  (ADVIL ) 200 MG tablet Take 600 mg by mouth every 6 (six) hours as needed for moderate pain.     injection device for insulin  (CEQUR SIMPLICITY 2U) DEVI change every 2 days 45 each 3   Injection Device for Insulin  (CEQUR SIMPLICITY INSERTER) MISC Use to Apply a new cecur simplicity patch as directed every 2 days 1 each 1   insulin  glargine, 2 Unit Dial, (TOUJEO  MAX SOLOSTAR) 300 UNIT/ML Solostar Pen Inject 45  Units into the skin in the morning. 15 mL 4   Insulin  Pen Needle (BD PEN NEEDLE NANO U/F) 32G X 4 MM MISC 1 Device by Other route daily in the afternoon. USE AS DIRECTED FOUR TIMES DAILY. Dx code  E11.42 100 each 3   Insulin  Syringe-Needle U-100 (GNP INSULIN  SYRINGES) 30G X 5/16 1 ML MISC 1 Device by Does not apply route daily in the afternoon. 100 each 3   Lancet Devices (BAYER MICROLET 2 LANCING DEVIC) MISC Use to check blood sugar up to 3 ties a day 1 each 2   Lancets (FREESTYLE) lancets Use 5 times daily to check blood sugar. 450 each 3   lipase/protease/amylase (CREON ) 36000 UNITS CPEP capsule TAKE 1 CAPSULE BY MOUTH THREE TIMES DAILY BEFORE A MEAL 200 capsule 0   lisinopril  (ZESTRIL ) 40 MG tablet TAKE 1 TABLET BY MOUTH DAILY. HOLD IF SYSTOLIC BLOOD PRESSURE IS LESS THAN 110 90 tablet 3   mupirocin  ointment  (BACTROBAN ) 2 % Apply 1 Application topically 2 (two) times daily. 22 g 0   omeprazole  (PRILOSEC) 40 MG capsule TAKE 1 CAPSULE(40 MG) BY MOUTH DAILY 90 capsule 1   oxyCODONE  (OXY IR/ROXICODONE ) 5 MG immediate release tablet Take 1 tablet (5 mg total) by mouth every 6 (six) hours as needed for severe pain (pain score 7-10) or moderate pain (pain score 4-6). 10 tablet 0   pioglitazone  (ACTOS ) 30 MG tablet TAKE 1 TABLET(30 MG) BY MOUTH DAILY 90 tablet 3   tirzepatide  (MOUNJARO ) 7.5 MG/0.5ML Pen Inject 7.5 mg into the skin once a week. 6 mL 3   No current facility-administered medications on file prior to visit.    Family History  Problem Relation Age of Onset   Diabetes Mother    Coronary artery disease Mother    Coronary artery disease Father    Liver disease Sister    Cancer Brother        colon caner stage 4 as of 11/2011 (unknown age of onset)   Prostate cancer Brother    Colon cancer Brother    Rectal cancer Neg Hx    Esophageal cancer Neg Hx     Social History   Socioeconomic History   Marital status: Single    Spouse name: Not on file   Number of children: 1   Years of education: 12   Highest education level: Not on file  Occupational History   Occupation: retired  Tobacco Use   Smoking status: Former    Current packs/day: 0.00    Types: Cigarettes    Quit date: 1967    Years since quitting: 58.5    Passive exposure: Never   Smokeless tobacco: Former    Types: Chew    Quit date: 1980  Vaping Use   Vaping status: Never Used  Substance and Sexual Activity   Alcohol use: No    Alcohol/week: 0.0 standard drinks of alcohol   Drug use: No   Sexual activity: Yes    Partners: Male    Comment: declined condoms  Other Topics Concern   Not on file  Social History Narrative   Not on file   Social Drivers of Health   Financial Resource Strain: Low Risk  (04/04/2022)   Overall Financial Resource Strain (CARDIA)    Difficulty of Paying Living Expenses: Not hard at all   Food Insecurity: No Food Insecurity (02/23/2023)   Hunger Vital Sign    Worried About Running Out of Food in the Last Year: Never true  Ran Out of Food in the Last Year: Never true  Transportation Needs: No Transportation Needs (02/23/2023)   PRAPARE - Administrator, Civil Service (Medical): No    Lack of Transportation (Non-Medical): No  Physical Activity: Insufficiently Active (04/04/2022)   Exercise Vital Sign    Days of Exercise per Week: 1 day    Minutes of Exercise per Session: 20 min  Stress: No Stress Concern Present (04/04/2022)   [employer] of Occupational Health - Occupational Stress Questionnaire    Feeling of Stress : Not at all  Social Connections: Socially Isolated (04/04/2022)   Social Connection and Isolation Panel    Frequency of Communication with Friends and Family: Never    Frequency of Social Gatherings with Friends and Family: Once a week    Attends Religious Services: Never    Database administrator or Organizations: No    Attends Banker Meetings: Never    Marital Status: Divorced  Catering manager Violence: Not At Risk (02/23/2023)   Humiliation, Afraid, Rape, and Kick questionnaire    Fear of Current or Ex-Partner: No    Emotionally Abused: No    Physically Abused: No    Sexually Abused: No    Review of Systems: ROS negative except for what is noted on the assessment and plan.  Vitals:   11/22/23 1318 11/22/23 1355  BP: (!) 157/65 (!) 157/70  Pulse: 100 89  Temp: 98.3 F (36.8 C)   TempSrc: Oral   SpO2: 96%   Weight: 184 lb 6.4 oz (83.6 kg)   Height: 6' 1 (1.854 m)     Physical Exam: Constitutional: well-appearing male  in no acute distress Cardiovascular: regular rate and rhythm, no m/r/g Pulmonary/Chest: normal work of breathing on room air, lungs clear to auscultation bilaterally Abdominal: soft, non-tender, non-distended MSK: normal bulk and tone Neurological: alert & oriented x 3, 5/5 strength in  bilateral upper and lower extremities, normal gait Skin: Erythema extending from the right elbow to the right forearm, very warm to touch, area of fluctuance present in the center       Assessment & Plan:   Cellulitis of right elbow Pt presents for an acute visit for right elbow pain and erythema.  He states that this initially started about 3 weeks ago, with no exacerbating factor such as trauma to the area or falls.  He was seen in urgent care on June 26 for a supposedly abscess on his face, as well as having an encounter with someone who has syphilis.  He was treated with IM penicillin , discharged home with doxycycline .  His RPR was negative, and chlamydia gonorrhea screening was also negative.  He has finished his doxycycline  course, however his right elbow has not improved at all per patient. He is also endorsing some intermittent fevers and chills that have been ongoing for three weeks as well.   On my exam, his right elbow is very warm, and erythema extends to the forearm. There is an area of fluctuance, concerning for an abscess.   Given progression of this erythema, systemic symptoms, area of fluctuance, and antibiotic failure with doxycycline , I have sent him to the emergency room for further evaluation. He would benefit from imaging of the area with the consideration of IV antibiotics.   Patient discussed with Dr. Narendra  Loraine Bhullar, M.D. [facility] Internal Medicine, PGY-3 Pager: [phone] Date 11/22/2023 Time 4:33 PM

## 2023-11-22 NOTE — ED Triage Notes (Signed)
 Patient complains of right elbow swellling and redness.  Patient appears to have cellulitis.  Patient reports he is a diabetic.  Also reports he has recently been treated for syphillis.

## 2023-11-22 NOTE — ED Provider Triage Note (Signed)
 Emergency Medicine Provider Triage Evaluation Note  Nathan [name] , a 73 y.o. male  was evaluated in triage.  Pt complains of arm infection. Increasing pain, swelling, and redness to R forearm ongoing x 1 week.  Some chills.  No fever, headache, cp, sob, no dysuria. Hx of DM.  No hx of Syphilis  Review of Systems  Positive: As above Negative: As above  Physical Exam  BP (!) 146/66 (BP Location: Left Arm)   Pulse (!) 101   Temp 99 F (37.2 C)   Resp 17   Ht 6' 1 (1.854 m)   Wt 83.5 kg   SpO2 93%   BMI 24.28 kg/m  Gen:   Awake, no distress   Resp:  Normal effort  MSK:   Moves extremities without difficulty  Other:    Medical Decision Making  Medically screening exam initiated at 3:36 PM.  Appropriate orders placed.  CYNCERE SONTAG was informed that the remainder of the evaluation will be completed by another provider, this initial triage assessment does not replace that evaluation, and the importance of remaining in the ED until their evaluation is complete.     Nivia Colon, PA-C 11/22/23 1536

## 2023-11-22 NOTE — Assessment & Plan Note (Signed)
>>  ASSESSMENT AND PLAN FOR CELLULITIS OF RIGHT ELBOW WRITTEN ON 11/22/2023  4:33 PM BY NOORUDDIN, SAAD, MD  Pt presents for an acute visit for right elbow pain and erythema.  He states that this initially started about 3 weeks ago, with no exacerbating factor such as trauma to the area or falls.  He was seen in urgent care on June 26 for a supposedly abscess on his face, as well as having an encounter with someone who has syphilis.  He was treated with IM penicillin , discharged home with doxycycline .  His RPR was negative, and chlamydia gonorrhea screening was also negative.  He has finished his doxycycline  course, however his right elbow has not improved at all per patient. He is also endorsing some intermittent fevers and chills that have been ongoing for three weeks as well.   On my exam, his right elbow is very warm, and erythema extends to the forearm. There is an area of fluctuance, concerning for an abscess.   Given progression of this erythema, systemic symptoms, area of fluctuance, and antibiotic failure with doxycycline , I have sent him to the emergency room for further evaluation. He would benefit from imaging of the area with the consideration of IV antibiotics.

## 2023-11-22 NOTE — Assessment & Plan Note (Signed)
 Pt presents for an acute visit for right elbow pain and erythema.  He states that this initially started about 3 weeks ago, with no exacerbating factor such as trauma to the area or falls.  He was seen in urgent care on June 26 for a supposedly abscess on his face, as well as having an encounter with someone who has syphilis.  He was treated with IM penicillin , discharged home with doxycycline .  His RPR was negative, and chlamydia gonorrhea screening was also negative.  He has finished his doxycycline  course, however his right elbow has not improved at all per patient. He is also endorsing some intermittent fevers and chills that have been ongoing for three weeks as well.   On my exam, his right elbow is very warm, and erythema extends to the forearm. There is an area of fluctuance, concerning for an abscess.   Given progression of this erythema, systemic symptoms, area of fluctuance, and antibiotic failure with doxycycline , I have sent him to the emergency room for further evaluation. He would benefit from imaging of the area with the consideration of IV antibiotics.

## 2023-11-23 ENCOUNTER — Emergency Department (HOSPITAL_COMMUNITY): Payer: Medicare (Managed Care)

## 2023-11-23 DIAGNOSIS — Z794 Long term (current) use of insulin: Secondary | ICD-10-CM

## 2023-11-23 DIAGNOSIS — L03113 Cellulitis of right upper limb: Principal | ICD-10-CM

## 2023-11-23 DIAGNOSIS — B20 Human immunodeficiency virus [HIV] disease: Secondary | ICD-10-CM

## 2023-11-23 DIAGNOSIS — E1151 Type 2 diabetes mellitus with diabetic peripheral angiopathy without gangrene: Secondary | ICD-10-CM

## 2023-11-23 LAB — CBC
HCT: 38.4 % — ABNORMAL LOW (ref 39.0–52.0)
Hemoglobin: 12.6 g/dL — ABNORMAL LOW (ref 13.0–17.0)
MCH: 30.4 pg (ref 26.0–34.0)
MCHC: 32.8 g/dL (ref 30.0–36.0)
MCV: 92.5 fL (ref 80.0–100.0)
Platelets: 241 K/uL (ref 150–400)
RBC: 4.15 MIL/uL — ABNORMAL LOW (ref 4.22–5.81)
RDW: 14.1 % (ref 11.5–15.5)
WBC: 9.2 K/uL (ref 4.0–10.5)
nRBC: 0 % (ref 0.0–0.2)

## 2023-11-23 LAB — BASIC METABOLIC PANEL WITH GFR
Anion gap: 12 (ref 5–15)
BUN: 15 mg/dL (ref 8–23)
CO2: 19 mmol/L — ABNORMAL LOW (ref 22–32)
Calcium: 8.8 mg/dL — ABNORMAL LOW (ref 8.9–10.3)
Chloride: 102 mmol/L (ref 98–111)
Creatinine, Ser: 0.99 mg/dL (ref 0.61–1.24)
GFR, Estimated: >60 mL/min (ref >60)
Glucose, Bld: 205 mg/dL — ABNORMAL HIGH (ref 70–99)
Potassium: 3.6 mmol/L (ref 3.5–5.1)
Sodium: 133 mmol/L — ABNORMAL LOW (ref 135–145)

## 2023-11-23 LAB — GLUCOSE, CAPILLARY
Glucose-Capillary: 216 mg/dL — ABNORMAL HIGH (ref 70–99)
Glucose-Capillary: 183 mg/dL — ABNORMAL HIGH (ref 70–99)
Glucose-Capillary: 425 mg/dL — ABNORMAL HIGH (ref 70–99)

## 2023-11-23 LAB — T-HELPER CELLS (CD4) COUNT (NOT AT ARMC)
CD4 % Helper T Cell: 26 % — ABNORMAL LOW (ref 33–65)
CD4 T Cell Abs: 510 /uL (ref 400–1790)

## 2023-11-23 LAB — MRSA NEXT GEN BY PCR, NASAL: MRSA by PCR Next Gen: DETECTED — AB

## 2023-11-23 LAB — CBG MONITORING, ED: Glucose-Capillary: 209 mg/dL — ABNORMAL HIGH (ref 70–99)

## 2023-11-23 MED ORDER — METRONIDAZOLE 500 MG/100ML IV SOLN
500.0000 mg | Freq: Two times a day (BID) | INTRAVENOUS | Status: DC
  Administered 2023-11-23: 500 mg via INTRAVENOUS
  Filled 2023-11-23: qty 100

## 2023-11-23 MED ORDER — ACETAMINOPHEN 650 MG RE SUPP: 650.0000 mg | Freq: Four times a day (QID) | RECTAL | Status: DC | PRN

## 2023-11-23 MED ORDER — INSULIN ASPART 100 UNIT/ML IJ SOLN
0.0000 [IU] | Freq: Three times a day (TID) | INTRAMUSCULAR | Status: DC
  Administered 2023-11-23: 3 [IU] via SUBCUTANEOUS
  Administered 2023-11-23: 2 [IU] via SUBCUTANEOUS
  Administered 2023-11-23 – 2023-11-24 (×2): 3 [IU] via SUBCUTANEOUS

## 2023-11-23 MED ORDER — ACETAMINOPHEN 325 MG PO TABS
650.0000 mg | ORAL_TABLET | Freq: Four times a day (QID) | ORAL | Status: DC | PRN
Start: 2023-11-23 — End: 2023-11-23

## 2023-11-23 MED ORDER — LISINOPRIL 20 MG PO TABS
40.0000 mg | ORAL_TABLET | Freq: Every day | ORAL | Status: DC
  Administered 2023-11-23 – 2023-11-24 (×2): 40 mg via ORAL
  Filled 2023-11-23 (×2): qty 2

## 2023-11-23 MED ORDER — EZETIMIBE 10 MG PO TABS
10.0000 mg | ORAL_TABLET | Freq: Every day | ORAL | Status: DC
  Administered 2023-11-23 – 2023-11-24 (×2): 10 mg via ORAL
  Filled 2023-11-23 (×2): qty 1

## 2023-11-23 MED ORDER — PANTOPRAZOLE SODIUM 40 MG PO TBEC
40.0000 mg | DELAYED_RELEASE_TABLET | Freq: Every day | ORAL | Status: DC
  Administered 2023-11-23 – 2023-11-24 (×2): 40 mg via ORAL
  Filled 2023-11-23 (×2): qty 1

## 2023-11-23 MED ORDER — INSULIN GLARGINE-YFGN 100 UNIT/ML ~~LOC~~ SOLN
45.0000 [IU] | Freq: Every day | SUBCUTANEOUS | Status: DC
  Administered 2023-11-23 – 2023-11-24 (×2): 45 [IU] via SUBCUTANEOUS
  Filled 2023-11-23 (×3): qty 0.45

## 2023-11-23 MED ORDER — VANCOMYCIN HCL IN DEXTROSE 1-5 GM/200ML-% IV SOLN: 1000.0000 mg | Freq: Once | INTRAVENOUS | Status: DC

## 2023-11-23 MED ORDER — SODIUM CHLORIDE 0.9 % IV SOLN: 2.0000 g | Freq: Three times a day (TID) | INTRAVENOUS | Status: DC

## 2023-11-23 MED ORDER — SODIUM CHLORIDE 0.9 % IV SOLN
2.0000 g | Freq: Once | INTRAVENOUS | Status: AC
  Administered 2023-11-23: 2 g via INTRAVENOUS
  Filled 2023-11-23: qty 12.5

## 2023-11-23 MED ORDER — INSULIN ASPART 100 UNIT/ML IJ SOLN
10.0000 [IU] | Freq: Once | INTRAMUSCULAR | Status: AC
  Administered 2023-11-23: 10 [IU] via SUBCUTANEOUS

## 2023-11-23 MED ORDER — INSULIN ASPART 100 UNIT/ML IJ SOLN: 0.0000 [IU] | Freq: Every day | INTRAMUSCULAR | Status: DC

## 2023-11-23 MED ORDER — POLYETHYLENE GLYCOL 3350 17 G PO PACK: 17.0000 g | PACK | Freq: Every day | ORAL | Status: DC | PRN

## 2023-11-23 MED ORDER — BICTEGRAVIR-EMTRICITAB-TENOFOV 50-200-25 MG PO TABS
1.0000 | ORAL_TABLET | Freq: Every day | ORAL | Status: DC
  Administered 2023-11-23 – 2023-11-24 (×2): 1 via ORAL
  Filled 2023-11-23 (×2): qty 1

## 2023-11-23 MED ORDER — CILOSTAZOL 50 MG PO TABS
50.0000 mg | ORAL_TABLET | Freq: Two times a day (BID) | ORAL | Status: DC
  Administered 2023-11-23 – 2023-11-24 (×3): 50 mg via ORAL
  Filled 2023-11-23 (×4): qty 1

## 2023-11-23 MED ORDER — EMPAGLIFLOZIN 25 MG PO TABS
25.0000 mg | ORAL_TABLET | Freq: Every day | ORAL | Status: DC
  Administered 2023-11-23 – 2023-11-24 (×2): 25 mg via ORAL
  Filled 2023-11-23 (×2): qty 1

## 2023-11-23 MED ORDER — VANCOMYCIN HCL 1750 MG/350ML IV SOLN
1750.0000 mg | INTRAVENOUS | Status: DC
  Administered 2023-11-24: 1750 mg via INTRAVENOUS
  Filled 2023-11-23: qty 350

## 2023-11-23 MED ORDER — PIOGLITAZONE HCL 30 MG PO TABS
30.0000 mg | ORAL_TABLET | Freq: Every day | ORAL | Status: DC
  Administered 2023-11-23 – 2023-11-24 (×2): 30 mg via ORAL
  Filled 2023-11-23 (×2): qty 1

## 2023-11-23 MED ORDER — PANCRELIPASE (LIP-PROT-AMYL) 36000-114000 UNITS PO CPEP
36000.0000 [IU] | ORAL_CAPSULE | Freq: Three times a day (TID) | ORAL | Status: DC
  Administered 2023-11-23 – 2023-11-24 (×4): 36000 [IU] via ORAL
  Filled 2023-11-23 (×6): qty 1

## 2023-11-23 MED ORDER — ATORVASTATIN CALCIUM 40 MG PO TABS
40.0000 mg | ORAL_TABLET | Freq: Every day | ORAL | Status: DC
Start: 2023-11-23 — End: 2023-11-24
  Administered 2023-11-23 – 2023-11-24 (×2): 40 mg via ORAL
  Filled 2023-11-23 (×2): qty 1

## 2023-11-23 MED ORDER — IOHEXOL 350 MG/ML SOLN
75.0000 mL | Freq: Once | INTRAVENOUS | Status: AC | PRN
  Administered 2023-11-23: 75 mL via INTRAVENOUS

## 2023-11-23 MED ORDER — VANCOMYCIN HCL 1750 MG/350ML IV SOLN
1750.0000 mg | Freq: Once | INTRAVENOUS | Status: AC
  Administered 2023-11-23: 1750 mg via INTRAVENOUS
  Filled 2023-11-23: qty 350

## 2023-11-23 MED ORDER — ENOXAPARIN SODIUM 40 MG/0.4ML IJ SOSY
40.0000 mg | PREFILLED_SYRINGE | INTRAMUSCULAR | Status: DC
  Administered 2023-11-23: 40 mg via SUBCUTANEOUS
  Filled 2023-11-23: qty 0.4

## 2023-11-23 MED ORDER — ACETAMINOPHEN 500 MG PO TABS
1000.0000 mg | ORAL_TABLET | Freq: Four times a day (QID) | ORAL | Status: DC | PRN
  Administered 2023-11-23: 1000 mg via ORAL
  Filled 2023-11-23: qty 2

## 2023-11-23 MED ORDER — CLOPIDOGREL BISULFATE 75 MG PO TABS
75.0000 mg | ORAL_TABLET | Freq: Every day | ORAL | Status: DC
  Administered 2023-11-23 – 2023-11-24 (×2): 75 mg via ORAL
  Filled 2023-11-23 (×2): qty 1

## 2023-11-23 MED ORDER — LIDOCAINE-EPINEPHRINE 1 %-1:100000 IJ SOLN
10.0000 mL | Freq: Once | INTRAMUSCULAR | Status: DC
  Filled 2023-11-23: qty 1

## 2023-11-23 NOTE — ED Provider Notes (Signed)
 [facility]PARTMENT AT [facility] Provider Note   CSN: [id number] Arrival date & time: 11/22/23  1403     Patient presents with: Cellulitis   Nathan [name] is a 73 y.o. male.  Patient with past medical history significant for HIV, MS, history of MRSA infection, hepatitis C presents the emergency department due to to an infection on the right forearm near the elbow.  He was seen earlier today by internal medicine who recommended to come to the emergency department for evaluation and possible IV antibiotics.  The erythematous area reportedly began approximate 3 weeks ago with no known trauma.  He was seen in urgent care on 26 June for what was reported to be a possible abscess on his face and he also reported having an encounter with someone who had syphilis.  He was treated with IM penicillin  and sent home with a course of doxycycline .  His STI panel including RPR was negative.  He finished the doxycycline  course but he has had no improvement in the right elbow and also now reports intermittent fevers, chills.  He states the chills are worse at night.  Internal medicine noted warmth to the right elbow with erythema extending to the forearm and an area of the fluctuance concerning for a possible abscess.  The patient denies chest pain, shortness of breath, abdominal pain, nausea, vomiting.   HPI     Prior to Admission medications   Medication Sig Start Date End Date Taking? Authorizing Provider  atorvastatin  (LIPITOR) 40 MG tablet TAKE 1 TABLET(40 MG) BY MOUTH DAILY 06/04/23   Shamleffer, Ibtehal Jaralla, MD  bictegravir-emtricitabine -tenofovir  AF (BIKTARVY ) 50-200-25 MG TABS tablet Take 1 tablet by mouth daily. 06/12/23   Fleeta Kathie Jomarie LOISE, MD  Blood Glucose Monitoring Suppl (FREESTYLE LITE) w/Device KIT 1 Device by Does not apply route daily in the afternoon. 03/28/22   Shamleffer, Ibtehal Jaralla, MD  cilostazol  (PLETAL ) 50 MG tablet TAKE 1 TABLET(50 MG) BY MOUTH  TWICE DAILY 10/29/23   Nooruddin, Saad, MD  clopidogrel  (PLAVIX ) 75 MG tablet TAKE 1 TABLET(75 MG) BY MOUTH DAILY 03/22/23   Nooruddin, Roetta, MD  Continuous Glucose Sensor (FREESTYLE LIBRE 3 PLUS SENSOR) MISC Change sensor every 15 days. 05/25/23   Shamleffer, Ibtehal Jaralla, MD  doxycycline  (VIBRAMYCIN ) 100 MG capsule Take 1 capsule (100 mg total) by mouth 2 (two) times daily. 11/01/23   Johnie Flaming A, NP  empagliflozin  (JARDIANCE ) 25 MG TABS tablet Take 1 tablet (25 mg total) by mouth daily. 05/25/23   Shamleffer, Ibtehal Jaralla, MD  ezetimibe  (ZETIA ) 10 MG tablet TAKE 1 TABLET(10 MG) BY MOUTH DAILY 05/07/23   Nooruddin, Saad, MD  glucose blood (FREESTYLE LITE) test strip Use as instructed 01/10/23   Nooruddin, Saad, MD  glucose blood (FREESTYLE LITE) test strip Use 5 times daily to check blood sugar. 04/11/23   Gregary Sharper, MD  HUMALOG  100 UNIT/ML injection Max daily 110 units per CeQur 05/25/23   Shamleffer, Ibtehal Jaralla, MD  ibuprofen  (ADVIL ) 200 MG tablet Take 600 mg by mouth every 6 (six) hours as needed for moderate pain.    [provider]  injection device for insulin  (CEQUR SIMPLICITY 2U) DEVI change every 2 days 01/31/23   Shamleffer, Ibtehal Jaralla, MD  Injection Device for Insulin  (CEQUR SIMPLICITY INSERTER) MISC Use to Apply a new cecur simplicity patch as directed every 2 days 07/18/22   Rudy Sieving, MD  insulin  glargine, 2 Unit Dial, (TOUJEO  MAX SOLOSTAR) 300 UNIT/ML Solostar Pen Inject 45  Units into the skin in the morning. 08/29/23   Shamleffer, Ibtehal Jaralla, MD  Insulin  Pen Needle (BD PEN NEEDLE NANO U/F) 32G X 4 MM MISC 1 Device by Other route daily in the afternoon. USE AS DIRECTED FOUR TIMES DAILY. Dx code  Z88.57 05/25/23   Shamleffer, Ibtehal Jaralla, MD  Insulin  Syringe-Needle U-100 (GNP INSULIN  SYRINGES) 30G X 5/16 1 ML MISC 1 Device by Does not apply route daily in the afternoon. 09/26/22   Shamleffer, Donell Cardinal, MD  Lancet Devices (BAYER MICROLET 2  LANCING [facility]) MISC Use to check blood sugar up to 3 ties a day 04/08/15   Prentiss Prentice SAILOR, DO  Lancets (FREESTYLE) lancets Use 5 times daily to check blood sugar. 07/19/21   Gawaluck, Greylon, MD  lipase/protease/amylase (CREON ) 36000 UNITS CPEP capsule TAKE 1 CAPSULE BY MOUTH THREE TIMES DAILY BEFORE A MEAL 10/16/23   Nooruddin, Saad, MD  lisinopril  (ZESTRIL ) 40 MG tablet TAKE 1 TABLET BY MOUTH DAILY. HOLD IF SYSTOLIC BLOOD PRESSURE IS LESS THAN 110 01/15/23   Nooruddin, Saad, MD  mupirocin  ointment (BACTROBAN ) 2 % Apply 1 Application topically 2 (two) times daily. 11/01/23   Johnie Flaming A, NP  omeprazole  (PRILOSEC) 40 MG capsule TAKE 1 CAPSULE(40 MG) BY MOUTH DAILY 08/28/23   Nooruddin, Saad, MD  oxyCODONE  (OXY IR/ROXICODONE ) 5 MG immediate release tablet Take 1 tablet (5 mg total) by mouth every 6 (six) hours as needed for severe pain (pain score 7-10) or moderate pain (pain score 4-6). 03/12/23   Schuh, McKenzi P, PA-C  pioglitazone  (ACTOS ) 30 MG tablet TAKE 1 TABLET(30 MG) BY MOUTH DAILY 10/08/23   Shamleffer, Ibtehal Jaralla, MD  tirzepatide  (MOUNJARO ) 7.5 MG/0.5ML Pen Inject 7.5 mg into the skin once a week. 08/29/23   Shamleffer, Ibtehal Jaralla, MD    Allergies: Sulfonamide derivatives    Review of Systems  Updated Vital Signs BP 128/74   Pulse 84   Temp 98.5 F (36.9 C)   Resp 16   Ht 6' 1 (1.854 m)   Wt 83.5 kg   SpO2 99%   BMI 24.28 kg/m   Physical Exam Vitals and nursing note reviewed.  Constitutional:      General: He is not in acute distress.    Appearance: He is well-developed.  HENT:     Head: Normocephalic and atraumatic.  Eyes:     Conjunctiva/sclera: Conjunctivae normal.  Cardiovascular:     Rate and Rhythm: Normal rate and regular rhythm.     Heart sounds: No murmur heard. Pulmonary:     Effort: Pulmonary effort is normal. No respiratory distress.     Breath sounds: Normal breath sounds.  Abdominal:     Palpations: Abdomen is soft.     Tenderness: There  is no abdominal tenderness.  Musculoskeletal:        General: No swelling.     Cervical back: Neck supple.  Skin:    General: Skin is warm and dry.     Capillary Refill: Capillary refill takes less than 2 seconds.     Findings: Lesion present.     Comments: Erythematous fluctuant area on the lateral right forearm on the dorsal surface just distal to the elbow  Neurological:     Mental Status: He is alert.  Psychiatric:        Mood and Affect: Mood normal.     (all labs ordered are listed, but only abnormal results are displayed) Labs Reviewed  COMPREHENSIVE METABOLIC PANEL WITH GFR - Abnormal; Notable for  the following components:      Result Value   Glucose, Bld 202 (*)    Albumin  3.4 (*)    All other components within normal limits  CULTURE, BLOOD (ROUTINE X 2)  CULTURE, BLOOD (ROUTINE X 2)  CBC WITH DIFFERENTIAL/PLATELET  URINALYSIS, W/ REFLEX TO CULTURE (INFECTION SUSPECTED)  I-STAT CG4 LACTIC ACID, ED  I-STAT CG4 LACTIC ACID, ED    EKG: None  Radiology: CT EBLOW RIGHT W CONTRAST Result Date: 11/23/2023 CLINICAL DATA:  Soft tissue mass in the proximal right forearm EXAM: CT OF THE UPPER RIGHT EXTREMITY WITH CONTRAST TECHNIQUE: Multidetector CT imaging of the upper right extremity was performed according to the standard protocol following intravenous contrast administration. RADIATION DOSE REDUCTION: This exam was performed according to the departmental dose-optimization program which includes automated exposure control, adjustment of the mA and/or kV according to patient size and/or use of iterative reconstruction technique. CONTRAST:  75mL OMNIPAQUE  IOHEXOL  350 MG/ML SOLN COMPARISON:  None Available. FINDINGS: Bones/Joint/Cartilage Distal humerus appears within normal limits. The proximal radius and ulna are well visualized and within normal limits. No acute fracture or dislocation is seen. No joint effusion is seen. Ligaments Suboptimally assessed by CT. Muscles and Tendons  Surrounding musculature of the right arm is within normal limits. Soft tissues Mild soft tissue edema is noted about the elbow joint. Specifically in the area of clinical concern there is focal prominent edema identified. Suggestion of small subcutaneous fluid collection measuring approximately 2.6 cm in length and approximately 1.1 cm in depth is noted. No other fluid collection is seen. IMPRESSION: No acute bony abnormality noted. Generalized soft tissue edema about the proximal forearm and elbow. In the area of clinical concern there is a focal area suspicious for subcutaneous fluid as described. Electronically Signed   By: Oneil Devonshire M.D.   On: 11/23/2023 02:59   DG Chest 2 View Result Date: 11/22/2023 CLINICAL DATA:  01364 Infection 01364 EXAM: CHEST - 2 VIEW COMPARISON:  June 21, 2021 FINDINGS: Streaky atelectasis in the left lung base. No focal airspace consolidation, pleural effusion, or pneumothorax. No cardiomegaly. Aortic atherosclerosis. No acute fracture or destructive lesion. Multilevel degenerative disc disease of the spine. IMPRESSION: No acute cardiopulmonary abnormality. Electronically Signed   By: Rogelia Myers M.D.   On: 11/22/2023 16:21     Procedures   Medications Ordered in the ED  ceFEPIme  (MAXIPIME ) 2 g in sodium chloride  0.9 % 100 mL IVPB (2 g Intravenous New Bag/Given 11/23/23 0335)  vancomycin  (VANCOREADY) IVPB 1750 mg/350 mL (has no administration in time range)  iohexol  (OMNIPAQUE ) 350 MG/ML injection 75 mL (75 mLs Intravenous Contrast Given 11/23/23 0247)                                    Medical Decision Making Amount and/or Complexity of Data Reviewed Radiology: ordered.  Risk Prescription drug management.   This patient presents to the ED for concern of lesion on the right elbow, this involves an extensive number of treatment options, and is a complaint that carries with it a high risk of complications and morbidity.  The differential diagnosis  includes cellulitis, abscess, septic joint, olecranon bursitis, others   Co morbidities / Chronic conditions that complicate the patient evaluation  HIV   Additional history obtained:  Additional history obtained from EMR External records from outside source obtained and reviewed including internal medicine notes   Lab Tests:  I  Ordered, and personally interpreted labs.  The pertinent results include: No leukocytosis, lactic acid 0.7   Imaging Studies ordered:  I ordered imaging studies including chest x-ray and CT of the right elbow with contrast I independently visualized and interpreted imaging which showed no acute finding of the chest x-ray I agree with the radiologist interpretation   Problem List / ED Course / Critical interventions / Medication management   I ordered medication including vancomycin  and cefepime  Reevaluation of the patient after these medicines showed that the patient stayed the same I have reviewed the patients home medicines and have made adjustments as needed   Consultations Obtained:  I requested consultation with the internal medicine team, Dr. Tobie,  and discussed lab and imaging findings as well as pertinent plan - they recommend: Admission   Social Determinants of Health:  Patient is a former smoker   Test / Admission - Considered:  Patient with cellulitis with possible abscess of the right forearm, with failure of outpatient doxycycline .  Patient needs IV antibiotics at this time.  Patient will need admission for further management.  Patient will also likely need an orthopedic consult not emergently for further evaluation of the fluid collection.      Final diagnoses:  Cellulitis of right upper extremity    ED Discharge Orders     None          Logan Ubaldo KATHEE DEVONNA 11/23/23 9650    Carita Senior, MD 11/23/23 [phone]

## 2023-11-23 NOTE — Progress Notes (Signed)
 Pharmacy Antibiotic Note  Nathan [name] is a 73 y.o. male admitted on 11/22/2023 with R elbow cellulitis.  Pharmacy has been consulted for Cefepime  and Vancomycin  dosing. Loaded with Vanc 1750mg  IV in ED.  Plan: Cefepime  2gm IV q8h Vancomycin  1750 mg IV Q 24 hrs. Goal AUC 400-550. Expected AUC: 475 SCr used: 1.07 Will f/u renal function, micro data, and pt's clinical condition Vanc levels prn   Height: 6' 1 (185.4 cm) Weight: 83.5 kg (184 lb) IBW/kg (Calculated) : 79.9  Temp (24hrs), Avg:98.7 F (37.1 C), Min:98.3 F (36.8 C), Max:99 F (37.2 C)  Recent Labs  Lab 11/22/23 1536 11/22/23 1548  WBC 9.0  --   CREATININE 1.07  --   LATICACIDVEN  --  0.7    Estimated Creatinine Clearance: 68.5 mL/min (by C-G formula based on SCr of 1.07 mg/dL).    Allergies  Allergen Reactions   Sulfonamide Derivatives Hives    Antimicrobials this admission: 7/18 Vanc >>  7/18 Cefepime  >>   Microbiology results: 7/18 BCx:   Thank you for allowing pharmacy to be a part of this patient's care.  Vito Ralph, PharmD, BCPS Please see amion for complete clinical pharmacist phone list 11/23/2023 4:31 AM

## 2023-11-23 NOTE — Consult Note (Signed)
 Reason for Consult:Right FA abscess Referring Physician: [place] Time called: [phone] Time at bedside: 0930   Nathan [name] is an 73 y.o. male.  HPI: [name] comes to the ED with a 3 week hx/o right FA infection. He was seen at [facility] and placed on abx with helped a concurrent face infection but did not seem to affect his arm. It has continued to worsen. He is RHD.  Past Medical History:  Diagnosis Date   Asthma    per 2003 [facility] pulm records pfts    Blood dyscrasia    HIV   Carotid artery occlusion    Clotting disorder (HCC)    Colon polyps    noted previous colonoscopy [facility]   DDD (degenerative disc disease)    cervical spine   Depression    Diabetes mellitus    dx 2010   Diarrhea 02/24/2021   Dizziness 08/25/2021   Falls 08/25/2021   Fever    unknwon origin   GERD (gastroesophageal reflux disease)    Head swelling 05/23/2017   Hep C w/o coma, chronic (HCC)    History of syphilis    noted [facility] records   HIV infection (HCC)    undetectable viral load and CD4 ct 667 as of 11/2011   Hyperlipidemia    Hypertension    MRSA (methicillin resistant Staphylococcus aureus)    Multiple sclerosis (HCC)    in remission as of 11/2011 (diagnosed late 1980s)   Pain in limb-Right Leg 10/13/2013   Pancreas (digestive gland) works poorly    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HCC)    2002   Pneumonia    Prosthesis fitting 03/10/2019   PVD (peripheral vascular disease) (HCC)    Left Stent 07/02/2008   Scalp lesion 07/26/2017   UTI (urinary tract infection)    Wears dentures     Past Surgical History:  Procedure Laterality Date   ABDOMINAL AORTOGRAM W/LOWER EXTREMITY N/A 08/07/2017   Procedure: ABDOMINAL AORTOGRAM W/LOWER EXTREMITY;  Surgeon: Serene Gaile ORN, MD;  Location: [facility] INVASIVE CV LAB;  Service: Cardiovascular;  Laterality: N/A;   ABOVE KNEE LEG AMPUTATION  2009   Right Leg   COLONOSCOPY W/ BIOPSIES AND POLYPECTOMY     ENDARTERECTOMY Left 02/22/2023   Procedure:  LEFT ENDARTERECTOMY CAROTID;  Surgeon: Serene Gaile ORN, MD;  Location: [facility] OR;  Service: Vascular;  Laterality: Left;   LOWER EXTREMITY ANGIOGRAM Left 12/26/2011   Procedure: LOWER EXTREMITY ANGIOGRAM;  Surgeon: Gaile ORN Serene, MD;  Location: [facility] CATH LAB;  Service: Cardiovascular;  Laterality: Left;   LOWER EXTREMITY ANGIOGRAM Left 08/17/2017   Procedure: LOWER EXTREMITY ANGIOGRAM LEFT LEG WITH RUNOFF AND Stenting.;  Surgeon: Serene Gaile ORN, MD;  Location: [facility] OR;  Service: Vascular;  Laterality: Left;   MULTIPLE TOOTH EXTRACTIONS     OTHER SURGICAL HISTORY     left left with stents (Dr. Celso)   OTHER SURGICAL HISTORY     2003 colonoscopy 5 mm polyp transverse colon; (2)19mm  polyps in rectum-hyperplastic   PATCH ANGIOPLASTY Left 02/22/2023   Procedure: PATCH ANGIOPLASTY 1CMx14CM GEORGE LISLE;  Surgeon: Serene Gaile ORN, MD;  Location: [facility] OR;  Service: Vascular;  Laterality: Left;   PERIPHERAL VASCULAR INTERVENTION Left 08/17/2017   Procedure: POPLITEAL STENT;  Surgeon: Serene Gaile ORN, MD;  Location: [facility] OR;  Service: Vascular;  Laterality: Left;    Family History  Problem Relation Age of Onset   Diabetes Mother    Coronary artery disease Mother    Coronary artery disease  Father    Liver disease Sister    Cancer Brother        colon caner stage 4 as of 11/2011 (unknown age of onset)   Prostate cancer Brother    Colon cancer Brother    Rectal cancer Neg Hx    Esophageal cancer Neg Hx     Social History:  reports that he quit smoking about 58 years ago. His smoking use included cigarettes. He has never been exposed to tobacco smoke. He quit smokeless tobacco use about 45 years ago.  His smokeless tobacco use included chew. He reports that he does not drink alcohol and does not use drugs.  Allergies:  Allergies  Allergen Reactions   Sulfonamide Derivatives Hives    Medications: I have reviewed the patient's current medications.  Results for orders placed or  performed during the hospital encounter of 11/22/23 (from the past 48 hours)  Comprehensive metabolic panel     Status: Abnormal   Collection Time: 11/22/23  3:36 PM  Result Value Ref Range   Sodium 135 135 - 145 mmol/L   Potassium 3.6 3.5 - 5.1 mmol/L   Chloride 102 98 - 111 mmol/L   CO2 22 22 - 32 mmol/L   Glucose, Bld 202 (H) 70 - 99 mg/dL    Comment: Glucose reference range applies only to samples taken after fasting for at least 8 hours.   BUN 14 8 - 23 mg/dL   Creatinine, Ser 8.92 0.61 - 1.24 mg/dL   Calcium  9.2 8.9 - 10.3 mg/dL   Total Protein 7.4 6.5 - 8.1 g/dL   Albumin  3.4 (L) 3.5 - 5.0 g/dL   AST 16 15 - 41 U/L   ALT 14 0 - 44 U/L   Alkaline Phosphatase 85 38 - 126 U/L   Total Bilirubin 0.8 0.0 - 1.2 mg/dL   GFR, Estimated >39 >39 mL/min    Comment: (NOTE) Calculated using the CKD-EPI Creatinine Equation (2021)    Anion gap 11 5 - 15    Comment: Performed at [facility], [address], [place], [place] [address]  CBC with Differential     Status: None   Collection Time: 11/22/23  3:36 PM  Result Value Ref Range   WBC 9.0 4.0 - 10.5 K/uL   RBC 4.71 4.22 - 5.81 MIL/uL   Hemoglobin 14.2 13.0 - 17.0 g/dL   HCT 56.2 60.9 - 47.9 %   MCV 92.8 80.0 - 100.0 fL   MCH 30.1 26.0 - 34.0 pg   MCHC 32.5 30.0 - 36.0 g/dL   RDW 86.0 88.4 - 84.4 %   Platelets 301 150 - 400 K/uL   nRBC 0.0 0.0 - 0.2 %   Neutrophils Relative % 65 %   Neutro Abs 5.8 1.7 - 7.7 K/uL   Lymphocytes Relative 26 %   Lymphs Abs 2.3 0.7 - 4.0 K/uL   Monocytes Relative 8 %   Monocytes Absolute 0.7 0.1 - 1.0 K/uL   Eosinophils Relative 1 %   Eosinophils Absolute 0.1 0.0 - 0.5 K/uL   Basophils Relative 0 %   Basophils Absolute 0.0 0.0 - 0.1 K/uL   Immature Granulocytes 0 %   Abs Immature Granulocytes 0.04 0.00 - 0.07 K/uL    Comment: Performed at [facility], [address] [place], [place] [address]  I-Stat Lactic Acid, ED     Status: None   Collection Time: 11/22/23  3:48 PM  Result  Value Ref Range  Lactic Acid, Venous 0.7 0.5 - 1.9 mmol/L  Culture, blood (Routine X 2) w Reflex to ID Panel     Status: None (Preliminary result)   Collection Time: 11/23/23  3:50 AM   Specimen: BLOOD  Result Value Ref Range   Specimen Description BLOOD SITE NOT SPECIFIED    Special Requests      BOTTLES DRAWN AEROBIC AND ANAEROBIC Blood Culture adequate volume   Culture      NO GROWTH <12 HOURS Performed at [facility], [address] [place], [place] [address]    Report Status PENDING   Culture, blood (Routine X 2) w Reflex to ID Panel     Status: None (Preliminary result)   Collection Time: 11/23/23  3:55 AM   Specimen: BLOOD  Result Value Ref Range   Specimen Description BLOOD SITE NOT SPECIFIED    Special Requests      BOTTLES DRAWN AEROBIC AND ANAEROBIC Blood Culture adequate volume   Culture      NO GROWTH <12 HOURS Performed at [facility], [address], [place], [place] [address]    Report Status PENDING   Basic metabolic panel     Status: Abnormal   Collection Time: 11/23/23  5:00 AM  Result Value Ref Range   Sodium 133 (L) 135 - 145 mmol/L   Potassium 3.6 3.5 - 5.1 mmol/L   Chloride 102 98 - 111 mmol/L   CO2 19 (L) 22 - 32 mmol/L   Glucose, Bld 205 (H) 70 - 99 mg/dL    Comment: Glucose reference range applies only to samples taken after fasting for at least 8 hours.   BUN 15 8 - 23 mg/dL   Creatinine, Ser 9.00 0.61 - 1.24 mg/dL   Calcium  8.8 (L) 8.9 - 10.3 mg/dL   GFR, Estimated >39 >39 mL/min    Comment: (NOTE) Calculated using the CKD-EPI Creatinine Equation (2021)    Anion gap 12 5 - 15    Comment: Performed at [facility], [address] [place], [place] [address]  CBC     Status: Abnormal   Collection Time: 11/23/23  5:00 AM  Result Value Ref Range   WBC 9.2 4.0 - 10.5 K/uL   RBC 4.15 (L) 4.22 - 5.81 MIL/uL   Hemoglobin 12.6 (L) 13.0 - 17.0 g/dL   HCT 61.5 (L) 60.9 - 47.9 %   MCV 92.5 80.0 - 100.0 fL   MCH 30.4 26.0 - 34.0  pg   MCHC 32.8 30.0 - 36.0 g/dL   RDW 85.8 88.4 - 84.4 %   Platelets 241 150 - 400 K/uL   nRBC 0.0 0.0 - 0.2 %    Comment: Performed at [facility], [address], [place], [place] [address]  CBG monitoring, ED     Status: Abnormal   Collection Time: 11/23/23  7:38 AM  Result Value Ref Range   Glucose-Capillary 209 (H) 70 - 99 mg/dL    Comment: Glucose reference range applies only to samples taken after fasting for at least 8 hours.   *Note: Due to a large number of results and/or encounters for the requested time period, some results have not been displayed. A complete set of results can be found in Results Review.    CT EBLOW RIGHT W CONTRAST Result Date: 11/23/2023 CLINICAL DATA:  Soft tissue mass in the proximal right forearm EXAM: CT OF THE UPPER RIGHT EXTREMITY WITH CONTRAST TECHNIQUE: Multidetector CT imaging of the upper  right extremity was performed according to the standard protocol following intravenous contrast administration. RADIATION DOSE REDUCTION: This exam was performed according to the departmental dose-optimization program which includes automated exposure control, adjustment of the mA and/or kV according to patient size and/or use of iterative reconstruction technique. CONTRAST:  75mL OMNIPAQUE  IOHEXOL  350 MG/ML SOLN COMPARISON:  None Available. FINDINGS: Bones/Joint/Cartilage Distal humerus appears within normal limits. The proximal radius and ulna are well visualized and within normal limits. No acute fracture or dislocation is seen. No joint effusion is seen. Ligaments Suboptimally assessed by CT. Muscles and Tendons Surrounding musculature of the right arm is within normal limits. Soft tissues Mild soft tissue edema is noted about the elbow joint. Specifically in the area of clinical concern there is focal prominent edema identified. Suggestion of small subcutaneous fluid collection measuring approximately 2.6 cm in length and approximately 1.1 cm in depth is noted.  No other fluid collection is seen. IMPRESSION: No acute bony abnormality noted. Generalized soft tissue edema about the proximal forearm and elbow. In the area of clinical concern there is a focal area suspicious for subcutaneous fluid as described. Electronically Signed   By: Oneil Devonshire M.D.   On: 11/23/2023 02:59   DG Chest 2 View Result Date: 11/22/2023 CLINICAL DATA:  01364 Infection 01364 EXAM: CHEST - 2 VIEW COMPARISON:  June 21, 2021 FINDINGS: Streaky atelectasis in the left lung base. No focal airspace consolidation, pleural effusion, or pneumothorax. No cardiomegaly. Aortic atherosclerosis. No acute fracture or destructive lesion. Multilevel degenerative disc disease of the spine. IMPRESSION: No acute cardiopulmonary abnormality. Electronically Signed   By: Rogelia Myers M.D.   On: 11/22/2023 16:21    Review of Systems  Constitutional:  Positive for chills. Negative for diaphoresis and fever.  HENT:  Negative for ear discharge, ear pain, hearing loss and tinnitus.   Eyes:  Negative for photophobia and pain.  Respiratory:  Negative for cough and shortness of breath.   Cardiovascular:  Negative for chest pain.  Gastrointestinal:  Negative for abdominal pain, nausea and vomiting.  Genitourinary:  Negative for dysuria, flank pain, frequency and urgency.  Musculoskeletal:  Positive for arthralgias (Right forearm). Negative for back pain, myalgias and neck pain.  Neurological:  Negative for dizziness and headaches.  Hematological:  Does not bruise/bleed easily.  Psychiatric/Behavioral:  The patient is not nervous/anxious.    Blood pressure 131/76, pulse 81, temperature 98.2 F (36.8 C), resp. rate 18, height 6' 1 (1.854 m), weight 83.5 kg, SpO2 100%. Physical Exam Constitutional:      General: He is not in acute distress.    Appearance: He is well-developed. He is not diaphoretic.  HENT:     Head: Normocephalic and atraumatic.  Eyes:     General: No scleral icterus.        Right eye: No discharge.        Left eye: No discharge.     Conjunctiva/sclera: Conjunctivae normal.  Cardiovascular:     Rate and Rhythm: Normal rate and regular rhythm.  Pulmonary:     Effort: Pulmonary effort is normal. No respiratory distress.  Musculoskeletal:     Cervical back: Normal range of motion.     Comments: Right shoulder, elbow, wrist, digits- no skin wounds, proximal FA erythematous with fluctuant area on dorsum, no instability, no blocks to motion  Sens  Ax/R/M/U intact  Mot   Ax/ R/ PIN/ M/ AIN/ U intact  Rad 2+  Skin:    General: Skin is warm and dry.  Neurological:     Mental Status: He is alert.  Psychiatric:        Mood and Affect: Mood normal.        Behavior: Behavior normal.     Assessment/Plan: Right FA abscess -- Will I&D.     Ozell DOROTHA Ned, PA-C Orthopedic Surgery [phone] 11/23/2023, 9:35 AM

## 2023-11-23 NOTE — Progress Notes (Signed)
 HD#0 SUBJECTIVE:  Patient Summary: Nathan [name] is a 73 y.o. with a pertinent PMH of HIV, HTN, DM, and HLD, who presented with two weeks of RUE swelling and redness around the elbow and admitted for RUE cellulitis.   Overnight Events: No acute events overnight   Interim History: Pt seen and examined at the bedside this morning. He states that he is feeling okay but that his arm is still giving him trouble. He would like to get it taken care of. All questions and concerns were addressed at this time.   OBJECTIVE:  Vital Signs: Vitals:   11/23/23 0325 11/23/23 0657 11/23/23 1012 11/23/23 1041  BP: 128/74 131/76 (!) 146/85 (!) 161/66  Pulse: 84 81 84 79  Resp: 16 18 17 17   Temp: 98.5 F (36.9 C) 98.2 F (36.8 C) 98.7 F (37.1 C) 97.8 F (36.6 C)  TempSrc:   Oral   SpO2: 99% 100% 94% 96%  Weight:      Height:       Supplemental O2: Room Air SpO2: 96 %  Filed Weights   11/22/23 1526  Weight: 83.5 kg     Intake/Output Summary (Last 24 hours) at 11/23/2023 1414 Last data filed at 11/23/2023 1200 Gross per 24 hour  Intake 568.09 ml  Output 400 ml  Net 168.09 ml   Net IO Since Admission: 168.09 mL [11/23/23 1414]  Physical Exam: Const: Awake, alert in NAD HENT: Normocephalic, atraumatic, mucus membranes moist Card: RRR, No MRG, No pitting edema on LE's bilaterally  Resp: LCTAB, no increased work of breathing Abd: Soft, NTND Extremities: Warm, pink. RUE with obvious erythema and minimally fluctuant mass at proximal forearm. Purulent drainage noted. Erythema extending down the lateral forearm to the wrist.  Patient Lines/Drains/Airways Status     Active Line/Drains/Airways     Name Placement date Placement time Site Days   Peripheral IV 11/23/23 18 G Left Antecubital 11/23/23  0040  Antecubital  less than 1            Pertinent labs and imaging:      Latest Ref Rng & Units 11/23/2023    5:00 AM 11/22/2023    3:36 PM 05/30/2023    9:41 AM  CBC  WBC 4.0 -  10.5 K/uL 9.2  9.0  6.6   Hemoglobin 13.0 - 17.0 g/dL 87.3  85.7  84.7   Hematocrit 39.0 - 52.0 % 38.4  43.7  45.8   Platelets 150 - 400 K/uL 241  301  225        Latest Ref Rng & Units 11/23/2023    5:00 AM 11/22/2023    3:36 PM 05/30/2023    9:41 AM  CMP  Glucose 70 - 99 mg/dL 794  797  868   BUN 8 - 23 mg/dL 15  14  12    Creatinine 0.61 - 1.24 mg/dL 9.00  8.92  8.80   Sodium 135 - 145 mmol/L 133  135  139   Potassium 3.5 - 5.1 mmol/L 3.6  3.6  4.0   Chloride 98 - 111 mmol/L 102  102  104   CO2 22 - 32 mmol/L 19  22  26    Calcium  8.9 - 10.3 mg/dL 8.8  9.2  9.8   Total Protein 6.5 - 8.1 g/dL  7.4  7.3   Total Bilirubin 0.0 - 1.2 mg/dL  0.8  0.5   Alkaline Phos 38 - 126 U/L  85    AST 15 - 41  U/L  16  17   ALT 0 - 44 U/L  14  15     CT EBLOW RIGHT W CONTRAST Result Date: 11/23/2023 CLINICAL DATA:  Soft tissue mass in the proximal right forearm EXAM: CT OF THE UPPER RIGHT EXTREMITY WITH CONTRAST TECHNIQUE: Multidetector CT imaging of the upper right extremity was performed according to the standard protocol following intravenous contrast administration. RADIATION DOSE REDUCTION: This exam was performed according to the departmental dose-optimization program which includes automated exposure control, adjustment of the mA and/or kV according to patient size and/or use of iterative reconstruction technique. CONTRAST:  75mL OMNIPAQUE  IOHEXOL  350 MG/ML SOLN COMPARISON:  None Available. FINDINGS: Bones/Joint/Cartilage Distal humerus appears within normal limits. The proximal radius and ulna are well visualized and within normal limits. No acute fracture or dislocation is seen. No joint effusion is seen. Ligaments Suboptimally assessed by CT. Muscles and Tendons Surrounding musculature of the right arm is within normal limits. Soft tissues Mild soft tissue edema is noted about the elbow joint. Specifically in the area of clinical concern there is focal prominent edema identified. Suggestion of small  subcutaneous fluid collection measuring approximately 2.6 cm in length and approximately 1.1 cm in depth is noted. No other fluid collection is seen. IMPRESSION: No acute bony abnormality noted. Generalized soft tissue edema about the proximal forearm and elbow. In the area of clinical concern there is a focal area suspicious for subcutaneous fluid as described. Electronically Signed   By: Oneil Devonshire M.D.   On: 11/23/2023 02:59   DG Chest 2 View Result Date: 11/22/2023 CLINICAL DATA:  01364 Infection 01364 EXAM: CHEST - 2 VIEW COMPARISON:  June 21, 2021 FINDINGS: Streaky atelectasis in the left lung base. No focal airspace consolidation, pleural effusion, or pneumothorax. No cardiomegaly. Aortic atherosclerosis. No acute fracture or destructive lesion. Multilevel degenerative disc disease of the spine. IMPRESSION: No acute cardiopulmonary abnormality. Electronically Signed   By: Rogelia Myers M.D.   On: 11/22/2023 16:21    ASSESSMENT/PLAN:  Assessment: Principal Problem:   Cellulitis of right elbow Active Problems:   HIV disease (HCC)   Hyperlipidemia associated with type 2 diabetes mellitus (HCC)   PERIPHERAL VASCULAR DISEASE   GERD   Benign essential HTN   Type 2 DM with diabetic peripheral angiopathy w/o gangrene (HCC)   Pancreatic insufficiency   Plan: #RUE Cellulitis Pt with two week history of redness and swelling at the right proximal forearm. He had been treated with a course of doxycycline  that was ineffective. The area is warm and erythematous with purulent drainage.  -Pt started on Vanc, Cefepime , and flagyl  last night. Pt without leukocytosis, fever, or hx of drug resistant infections. Will deescalate these to vancomycin  to cover gram positive organisms including MRSA.  -Ortho consulted and preformed an I&D with cultures sent.  -Will monitor overnight and likely discharge tomorrow with oral linezolid.   #HIV Pt with long history of HIV. He is adherent to Biktarvy . Last  CD4 650 with undectable viral load in Jan 2025.  -CD4+ pending.   #T2DM Last A1C 8.6. Will continue Jardiance , 45 untis glargine, pioglitazone  -CBG ACHS and SSI  #Peripheral Vascular Disease -Continue home cilostazol  50mg  BID and Plavix   Best Practice: Diet: Regular diet IVF: Fluids: none, Rate:  VTE: enoxaparin  (LOVENOX ) injection 40 mg Start: 11/23/23 1600 Code: Full  Disposition planning: Therapy Recs: None, Family Contact: Ozell Batty, to be notified. DISPO: Anticipated discharge tomorrow to Home pending Clinical Improvement.  Signature:  Schuyler Novak, DO [place]  [facility]  2:14 PM, 11/23/2023  On Call pager [phone]

## 2023-11-23 NOTE — TOC CM/SW Note (Signed)
 Transition of Care [facility]) - Inpatient Brief Assessment   Patient Details  Name: Nathan [name] MRN: [id number] Date of Birth: December 08, 1950  Transition of Care [facility]) CM/SW Contact:    Lauraine FORBES Saa, LCSW Phone Number: 11/23/2023, 2:54 PM   Clinical Narrative:  2:54 PM Per chart review, patient has a PCP and insurance. Patient has SNF history with [facility]. Patient has HH history with [facility]. Patient has DME (prosthetics, rollator walker, BSC, diabetes shoes) history. Patient's preferred pharmacy's are [facility], [facility], and [facility] [phone]. Patient declined CSW and training CSW offers of SDOH (social connections) resources. No other TOC needs were identified at this time. TOC will continue to follow and be available to assist.  Transition of Care Asessment: Insurance and Status: Insurance coverage has been reviewed Patient has primary care physician: Yes Home environment has been reviewed: Private Residence Prior level of function:: N/A Prior/Current Home Services: No current home services (Has HH/DME history) Social Drivers of Health Review: SDOH reviewed interventions complete Readmission risk has been reviewed: Yes Transition of care needs: no transition of care needs at this time

## 2023-11-23 NOTE — Plan of Care (Signed)
  Problem: Coping: Goal: Ability to adjust to condition or change in health will improve Outcome: Progressing   Problem: Skin Integrity: Goal: Risk for impaired skin integrity will decrease Outcome: Progressing   Problem: Activity: Goal: Risk for activity intolerance will decrease Outcome: Progressing   Problem: Coping: Goal: Level of anxiety will decrease Outcome: Progressing   Problem: Pain Managment: Goal: General experience of comfort will improve and/or be controlled Outcome: Progressing   Problem: Safety: Goal: Ability to remain free from injury will improve Outcome: Progressing   Problem: Skin Integrity: Goal: Risk for impaired skin integrity will decrease Outcome: Progressing

## 2023-11-23 NOTE — Hospital Course (Addendum)
 RUE Cellulitis The pt presented to the ED on 7/17 with a two week history of RUE swelling and redness around the elbow. He also had a facial abscess with which he was seen at urgent care and given doxycycline . The facial abscess resolved, however his arm remained the same. Upon presentation a CT was performed that showed generalized soft tissue edema around proximal forearm and elbow with focal area suspicious for subcutaneous fluid as described. He was started on Vancomycin , Cefepime , and Flagyl . On 7/18 he was seen and his antibiotics were adjusted to Vancomycin . Orthopedics were consulted. After evaluation by ortho, a bedside I&D was performed and sent for culture. On 7/19 the preliminary culture showed gram positive cocci in pairs. He remained afebrile without leukocytosis. He was deemed fit for discharge with a 6 day course of linezolid .   Acute Kidney Injury Upon admission, his Cr was 0.44 with BUN 15. On 7/19, his creatinine rose to 1.49 with BUN 31. Potentially due to vancomycin  and decreased oral intake. He was given a 1L bolus LR and instructed to follow up on 7/22 in the [facility] clinic for follow up labs. Lisinopril  and ibuprofen  held at discharge until instructed to restart by PCP

## 2023-11-23 NOTE — ED Notes (Signed)
 5C charge notified of patient coming up

## 2023-11-23 NOTE — Plan of Care (Signed)
 Problem: Education: Goal: Ability to describe self-care measures that may prevent or decrease complications (Diabetes Survival Skills Education) will improve 11/23/2023 1947 by Lang Saucier, RN Outcome: Progressing 11/23/2023 1947 by Lang Saucier, RN Outcome: Progressing Goal: Individualized Educational Video(s) 11/23/2023 1947 by Lang Saucier, RN Outcome: Progressing 11/23/2023 1947 by Lang Saucier, RN Outcome: Progressing   Problem: Coping: Goal: Ability to adjust to condition or change in health will improve 11/23/2023 1947 by Lang Saucier, RN Outcome: Progressing 11/23/2023 1947 by Lang Saucier, RN Outcome: Progressing   Problem: Fluid Volume: Goal: Ability to maintain a balanced intake and output will improve 11/23/2023 1947 by Lang Saucier, RN Outcome: Progressing 11/23/2023 1947 by Lang Saucier, RN Outcome: Progressing   Problem: Health Behavior/Discharge Planning: Goal: Ability to identify and utilize available resources and services will improve 11/23/2023 1947 by Lang Saucier, RN Outcome: Progressing 11/23/2023 1947 by Lang Saucier, RN Outcome: Progressing Goal: Ability to manage health-related needs will improve 11/23/2023 1947 by Lang Saucier, RN Outcome: Progressing 11/23/2023 1947 by Lang Saucier, RN Outcome: Progressing   Problem: Metabolic: Goal: Ability to maintain appropriate glucose levels will improve 11/23/2023 1947 by Lang Saucier, RN Outcome: Progressing 11/23/2023 1947 by Lang Saucier, RN Outcome: Progressing   Problem: Nutritional: Goal: Maintenance of adequate nutrition will improve 11/23/2023 1947 by Lang Saucier, RN Outcome: Progressing 11/23/2023 1947 by Lang Saucier, RN Outcome: Progressing Goal: Progress toward achieving an optimal weight will improve 11/23/2023 1947 by Lang Saucier, RN Outcome:  Progressing 11/23/2023 1947 by Lang Saucier, RN Outcome: Progressing   Problem: Skin Integrity: Goal: Risk for impaired skin integrity will decrease 11/23/2023 1947 by Lang Saucier, RN Outcome: Progressing 11/23/2023 1947 by Lang Saucier, RN Outcome: Progressing   Problem: Tissue Perfusion: Goal: Adequacy of tissue perfusion will improve 11/23/2023 1947 by Lang Saucier, RN Outcome: Progressing 11/23/2023 1947 by Lang Saucier, RN Outcome: Progressing   Problem: Education: Goal: Knowledge of General Education information will improve Description: Including pain rating scale, medication(s)/side effects and non-pharmacologic comfort measures 11/23/2023 1947 by Lang Saucier, RN Outcome: Progressing 11/23/2023 1947 by Lang Saucier, RN Outcome: Progressing   Problem: Health Behavior/Discharge Planning: Goal: Ability to manage health-related needs will improve 11/23/2023 1947 by Lang Saucier, RN Outcome: Progressing 11/23/2023 1947 by Lang Saucier, RN Outcome: Progressing   Problem: Clinical Measurements: Goal: Ability to maintain clinical measurements within normal limits will improve 11/23/2023 1947 by Lang Saucier, RN Outcome: Progressing 11/23/2023 1947 by Lang Saucier, RN Outcome: Progressing Goal: Will remain free from infection 11/23/2023 1947 by Lang Saucier, RN Outcome: Progressing 11/23/2023 1947 by Lang Saucier, RN Outcome: Progressing Goal: Diagnostic test results will improve 11/23/2023 1947 by Lang Saucier, RN Outcome: Progressing 11/23/2023 1947 by Lang Saucier, RN Outcome: Progressing Goal: Respiratory complications will improve 11/23/2023 1947 by Lang Saucier, RN Outcome: Progressing 11/23/2023 1947 by Lang Saucier, RN Outcome: Progressing Goal: Cardiovascular complication will be avoided 11/23/2023 1947 by Lang Saucier,  RN Outcome: Progressing 11/23/2023 1947 by Lang Saucier, RN Outcome: Progressing   Problem: Activity: Goal: Risk for activity intolerance will decrease 11/23/2023 1947 by Lang Saucier, RN Outcome: Progressing 11/23/2023 1947 by Lang Saucier, RN Outcome: Progressing   Problem: Nutrition: Goal: Adequate nutrition will be maintained Outcome: Progressing   Problem: Coping: Goal: Level of anxiety will decrease Outcome: Progressing   Problem: Elimination: Goal: Will not experience complications related to bowel motility Outcome: Progressing Goal: Will not experience complications related to urinary retention Outcome: Progressing   Problem: Pain Managment: Goal: General experience of comfort will improve and/or be controlled Outcome: Progressing  Problem: Safety: Goal: Ability to remain free from injury will improve Outcome: Progressing   Problem: Skin Integrity: Goal: Risk for impaired skin integrity will decrease Outcome: Progressing

## 2023-11-23 NOTE — H&P (Signed)
 Date: 11/23/2023               Patient Name:  Nathan [name] MRN: [id number]  DOB: 04-19-1951 Age / Sex: 73 y.o., male   PCP: Nelia Dirks, MD         Medical Service: Internal Medicine Teaching Service         Attending Physician: Dr. Dayton Eastern      First Contact: Rebecka Pion, DO}    Second Contact: Dr. Hadassah Kristy Ahr, MD          Pager Information: First Contact Pager: [phone]   Second Contact Pager: [phone]   SUBJECTIVE   Chief Complaint: Right arm swelling  History of Present Illness: Nathan [name] is a 73 y.o. male with PMH of HIV, HTN, DM, hyperlipidemia who presents with right arm swelling and redness that started about 2 weeks ago.  Patient said it has progressively worsened.  Patient noticed an abscess on his face around the same time 2 weeks ago and went to the urgent care to get treated.  At that time he also mentioned to the provider at the urgent care that he may have had an encounter with someone who has syphilis.  Patient's RPR was negative then. During that urgent care visit, he was treated with IM penicillin  and discharged with p.o. doxycycline .  Patient said his facial abscess went down but is not fully better.  Additionally, his right arm swelling, pain, redness worsened despite taking doxycycline .  His last doxycycline  dose was this past Sunday 11/18/2023.  Around the same time, he tried to drain the area with an insulin  pen needle in which he did have some drainage.  He was seen by the clinic today for the same concern and was directed to the emergency department for further evaluation and management.  Upon arrival to the emergency department he noticed pus draining from his right arm. Patient felt subjective fevers and chills several times in the past 2 weeks.  He denies any diarrhea or changes in his bowel movements, chest pain, shortness of breath. He mentions having a history of clots for which stents were placed in his left leg.  He is sexually active  with 1 partner but not all the time. Patient also noticed another small spot on his left leg a few days ago.  He mentioned the small spot is different than his right arm swelling.  ED Course: Imaging: CT scan of the right elbow showed no acute fracture or bony abnormality.  It was positive for soft tissue edema in the proximal forearm and elbow and there was a focal area in the region that was suspicious for subcutaneous fluid. Received: Cefepime  and vancomycin  for cellulitis Consulted: IMTS for admission  Meds:  Patient reported:  Atorvastatin  40 mg daily Biktarvy  1 tablet daily Pletal  50 mg twice daily Plavix  75 mg daily Jardiance  25 mg daily Zetia  10 mg daily Humalog  110 units Advil  600 mg every 6 hours Toujeo  45 units daily Creon  1 capsule 3 times daily with meals Lisinopril  40 mg daily Omeprazole  40 mg daily Pioglitazone  30 mg daily Tirzepatide  7.5 mg weekly  Past Medical History HIV, HTN, DM, HLD, atherosclerosis of native artery of extremity with intermittent claudication, right lower extremity prosthesis  Past Surgical History Past Surgical History:  Procedure Laterality Date   ABDOMINAL AORTOGRAM W/LOWER EXTREMITY N/A 08/07/2017   Procedure: ABDOMINAL AORTOGRAM W/LOWER EXTREMITY;  Surgeon: Serene Gaile ORN, MD;  Location: [facility] INVASIVE [facility] LAB;  Service: Cardiovascular;  Laterality: N/A;   ABOVE KNEE LEG AMPUTATION  2009   Right Leg   COLONOSCOPY W/ BIOPSIES AND POLYPECTOMY     ENDARTERECTOMY Left 02/22/2023   Procedure: LEFT ENDARTERECTOMY CAROTID;  Surgeon: Serene Gaile ORN, MD;  Location: [facility] OR;  Service: Vascular;  Laterality: Left;   LOWER EXTREMITY ANGIOGRAM Left 12/26/2011   Procedure: LOWER EXTREMITY ANGIOGRAM;  Surgeon: Gaile ORN Serene, MD;  Location: [facility] CATH LAB;  Service: Cardiovascular;  Laterality: Left;   LOWER EXTREMITY ANGIOGRAM Left 08/17/2017   Procedure: LOWER EXTREMITY ANGIOGRAM LEFT LEG WITH RUNOFF AND Stenting.;  Surgeon: Serene Gaile ORN, MD;   Location: [facility] OR;  Service: Vascular;  Laterality: Left;   MULTIPLE TOOTH EXTRACTIONS     OTHER SURGICAL HISTORY     left left with stents (Dr. Celso)   OTHER SURGICAL HISTORY     2003 colonoscopy 5 mm polyp transverse colon; (2)60mm  polyps in rectum-hyperplastic   PATCH ANGIOPLASTY Left 02/22/2023   Procedure: PATCH ANGIOPLASTY 1CMx14CM GEORGE LISLE;  Surgeon: Serene Gaile ORN, MD;  Location: [facility] OR;  Service: Vascular;  Laterality: Left;   PERIPHERAL VASCULAR INTERVENTION Left 08/17/2017   Procedure: POPLITEAL STENT;  Surgeon: Serene Gaile ORN, MD;  Location: [facility] OR;  Service: Vascular;  Laterality: Left;     Social:  Lives with: Alone at home Support: Great support at home Substance: no substance use Level of function: Independent in all ADLs and iADLs  Occupation: Scientist, product/process development, retired now  PCP: Nelia Dirks, MD    Family History  Problem Relation Age of Onset   Diabetes Mother    Coronary artery disease Mother    Coronary artery disease Father    Liver disease Sister    Cancer Brother        colon caner stage 4 as of 11/2011 (unknown age of onset)   Prostate cancer Brother    Colon cancer Brother    Rectal cancer Neg Hx    Esophageal cancer Neg Hx      Allergies: Allergies as of 11/22/2023 - Review Complete 11/22/2023  Allergen Reaction Noted   Sulfonamide derivatives Hives 02/12/2006    Review of Systems: A complete ROS was negative except as per HPI.   OBJECTIVE:   Physical Exam: Blood pressure 128/74, pulse 84, temperature 98.5 F (36.9 C), resp. rate 16, height 6' 1 (1.854 m), weight 83.5 kg, SpO2 99%.  Constitutional: well-appearing resting comfortably in bed, in no acute distress HENT: normocephalic atraumatic head Cardiovascular: regular rate and rhythm, no m/r/g Pulmonary/Chest: normal work of breathing on room air, lungs clear to auscultation bilaterally Neurological: 5/5 strength in bilateral upper and lower extremities Skin: Right  forearm showed erythema and swelling with minimal pus drainage (1st image); left leg had a small wound on the shin (2nd image) Extremities: Right prosthesis appreciated    Labs: CBC    Component Value Date/Time   WBC 9.0 11/22/2023 1536   RBC 4.71 11/22/2023 1536   HGB 14.2 11/22/2023 1536   HCT 43.7 11/22/2023 1536   HCT 24.8 (L) 01/02/2019 0455   PLT 301 11/22/2023 1536   MCV 92.8 11/22/2023 1536   MCH 30.1 11/22/2023 1536   MCHC 32.5 11/22/2023 1536   RDW 13.9 11/22/2023 1536   LYMPHSABS 2.3 11/22/2023 1536   MONOABS 0.7 11/22/2023 1536   EOSABS 0.1 11/22/2023 1536   BASOSABS 0.0 11/22/2023 1536     CMP     Component Value Date/Time   NA 135 11/22/2023 1536   NA 138  01/25/2017 1455   K 3.6 11/22/2023 1536   CL 102 11/22/2023 1536   CO2 22 11/22/2023 1536   GLUCOSE 202 (H) 11/22/2023 1536   BUN 14 11/22/2023 1536   BUN 9 01/25/2017 1455   CREATININE 1.07 11/22/2023 1536   CREATININE 1.19 05/30/2023 0941   CALCIUM  9.2 11/22/2023 1536   PROT 7.4 11/22/2023 1536   ALBUMIN  3.4 (L) 11/22/2023 1536   AST 16 11/22/2023 1536   ALT 14 11/22/2023 1536   ALKPHOS 85 11/22/2023 1536   BILITOT 0.8 11/22/2023 1536   GFRNONAA >60 11/22/2023 1536   GFRNONAA 82 07/29/2020 0942   GFRAA 95 07/29/2020 0942    Imaging:  CT EBLOW RIGHT W CONTRAST Result Date: 11/23/2023 CLINICAL DATA:  Soft tissue mass in the proximal right forearm EXAM: CT OF THE UPPER RIGHT EXTREMITY WITH CONTRAST TECHNIQUE: Multidetector CT imaging of the upper right extremity was performed according to the standard protocol following intravenous contrast administration. RADIATION DOSE REDUCTION: This exam was performed according to the departmental dose-optimization program which includes automated exposure control, adjustment of the mA and/or kV according to patient size and/or use of iterative reconstruction technique. CONTRAST:  75mL OMNIPAQUE  IOHEXOL  350 MG/ML SOLN COMPARISON:  None Available. FINDINGS:  Bones/Joint/Cartilage Distal humerus appears within normal limits. The proximal radius and ulna are well visualized and within normal limits. No acute fracture or dislocation is seen. No joint effusion is seen. Ligaments Suboptimally assessed by CT. Muscles and Tendons Surrounding musculature of the right arm is within normal limits. Soft tissues Mild soft tissue edema is noted about the elbow joint. Specifically in the area of clinical concern there is focal prominent edema identified. Suggestion of small subcutaneous fluid collection measuring approximately 2.6 cm in length and approximately 1.1 cm in depth is noted. No other fluid collection is seen. IMPRESSION: No acute bony abnormality noted. Generalized soft tissue edema about the proximal forearm and elbow. In the area of clinical concern there is a focal area suspicious for subcutaneous fluid as described. Electronically Signed   By: Oneil Devonshire M.D.   On: 11/23/2023 02:59   DG Chest 2 View Result Date: 11/22/2023 CLINICAL DATA:  01364 Infection 01364 EXAM: CHEST - 2 VIEW COMPARISON:  June 21, 2021 FINDINGS: Streaky atelectasis in the left lung base. No focal airspace consolidation, pleural effusion, or pneumothorax. No cardiomegaly. Aortic atherosclerosis. No acute fracture or destructive lesion. Multilevel degenerative disc disease of the spine. IMPRESSION: No acute cardiopulmonary abnormality. Electronically Signed   By: Rogelia Myers M.D.   On: 11/22/2023 16:21       ASSESSMENT & PLAN:   Assessment & Plan by Problem: Principal Problem:   Cellulitis of right elbow Active Problems:   HIV disease (HCC)   Hyperlipidemia associated with type 2 diabetes mellitus (HCC)   PERIPHERAL VASCULAR DISEASE   GERD   Benign essential HTN   Type 2 DM with diabetic peripheral angiopathy w/o gangrene (HCC)   Pancreatic insufficiency   Nathan [name] is a 73 y.o. person living with a history of HIV, HTN, DM who presented with right forearm  swelling and redness and admitted for cellulitis of the right elbow on hospital day 0    Cellulitis of right elbow Patient admitted for cellulitis of right elbow.  Not septic appearing.  Blood cultures obtained.  Does not seem to be a septic joint, less likely crystal arthropathy, or thrombus related.  In the ER patient received cefepime  and vancomycin .  Will continue with these 2 medications  and add on Flagyl  for anaerobic coverage.  Waiting for patient's blood cultures. -- Started IV vancomycin  day 1  -- Started IV 2g cefepime  day 1 -- Started IV Flagyl  500 mg Q12h day 1 -- Blood culture results pending --Consider Ortho consult in the morning for fluid drainage --Monitor fever curve, monitor white count    HIV disease (HCC) Patient is HIV positive.  His last CD4 count was 650 (normal) checked on 05/30/2023.  Last viral load on 05/30/2023 was undetectable. --Resume home Biktarvy  50 - 200 - 25 mg taken once daily -- CD4 levels results pending --HIV-RNA quant results pending    Type 2 DM with diabetic peripheral angiopathy w/o gangrene (HCC) Most recent A1c on 08/01/2023 8.6.  Blood glucose level on CMP today elevated at 202.  Patient is on home Humalog  which he administers himself via device on his abdomen.  Will have him hold that while inpatient and start sliding scale. --Sliding scale insulin  -- Resume home Jardiance  25 Mg daily -- Resume home long-acting insulin  45 units daily -- Resume pioglitazone  30 mg daily --Hold home Mounjaro  7.5 mg weekly -- Hold home Humalog  administration by device    Hyperlipidemia associated with type 2 diabetes mellitus (HCC) --Resume atorvastatin  40 mg daily    PERIPHERAL VASCULAR DISEASE --Resume home cilostazol  50 mg BID tablet --Resume home Plavix  75 mg tablet once a day    GERD --Resume home omeprazole  40 mg tablet daily    Benign essential HTN Blood pressure well-controlled at 128/74. --Resume home lisinopril  40 mg daily    Pancreatic  insufficiency -- Resume home Creon  36000 units CPEP capsule: Tablet taken 3 times daily with meals  Best practice: Diet: Carb-Modified VTE: Lovenox  40 Mg IVF: None Code: DNR  Disposition planning: Prior to Admission Living Arrangement: Home Anticipated Discharge Location: Home  Dispo: Admit patient to Observation with expected length of stay less than 2 midnights.  Signed: Edgardo Pontiff, DO Internal Medicine Resident  11/23/2023, 6:00 AM  Please contact IM Residency On-Call Pager at: [phone] or [phone].

## 2023-11-23 NOTE — Procedures (Signed)
 Procedure: Right forearm I&D   Indication: Right forearm abscess   Surgeon: Ozell Ned, PA-C   Assist: None   Anesthesia: 8ml 1% lidocaine  w/epi   EBL: None   Complications: None   Findings: After risks/benefits explained patient desires to undergo procedure. Consent obtained and time out performed. The right FA abscess was numbed with 1% lidocaine  with epi then was sterilely prepped and draped. A 4cm incision was made and 5-72ml bloody purulence encountered. Cultures taken. Cavity explored digitally and with hemostat without additional pockets encountered. Wound then irrigated with 2L NS, packed, and dressed. Pt tolerated the procedure well.       Ozell DOROTHA Ned, PA-C Orthopedic Surgery [phone]

## 2023-11-24 ENCOUNTER — Other Ambulatory Visit: Payer: Self-pay | Admitting: Student

## 2023-11-24 ENCOUNTER — Telehealth: Payer: Self-pay

## 2023-11-24 ENCOUNTER — Other Ambulatory Visit (HOSPITAL_COMMUNITY): Payer: Self-pay

## 2023-11-24 ENCOUNTER — Other Ambulatory Visit: Payer: Self-pay | Admitting: Internal Medicine

## 2023-11-24 DIAGNOSIS — E7800 Pure hypercholesterolemia, unspecified: Secondary | ICD-10-CM

## 2023-11-24 DIAGNOSIS — E1169 Type 2 diabetes mellitus with other specified complication: Secondary | ICD-10-CM

## 2023-11-24 DIAGNOSIS — L03113 Cellulitis of right upper limb: Secondary | ICD-10-CM

## 2023-11-24 LAB — CBC
HCT: 37.2 % — ABNORMAL LOW (ref 39.0–52.0)
Hemoglobin: 12.3 g/dL — ABNORMAL LOW (ref 13.0–17.0)
MCH: 30.1 pg (ref 26.0–34.0)
MCHC: 33.1 g/dL (ref 30.0–36.0)
MCV: 91 fL (ref 80.0–100.0)
Platelets: 272 K/uL (ref 150–400)
RBC: 4.09 MIL/uL — ABNORMAL LOW (ref 4.22–5.81)
RDW: 14 % (ref 11.5–15.5)
WBC: 6.7 K/uL (ref 4.0–10.5)
nRBC: 0 % (ref 0.0–0.2)

## 2023-11-24 LAB — GLUCOSE, CAPILLARY
Glucose-Capillary: 281 mg/dL — ABNORMAL HIGH (ref 70–99)
Glucose-Capillary: 217 mg/dL — ABNORMAL HIGH (ref 70–99)
Glucose-Capillary: 269 mg/dL — ABNORMAL HIGH (ref 70–99)

## 2023-11-24 LAB — BASIC METABOLIC PANEL WITH GFR
Anion gap: 16 — ABNORMAL HIGH (ref 5–15)
BUN: 31 mg/dL — ABNORMAL HIGH (ref 8–23)
CO2: 19 mmol/L — ABNORMAL LOW (ref 22–32)
Calcium: 9.4 mg/dL (ref 8.9–10.3)
Chloride: 101 mmol/L (ref 98–111)
Creatinine, Ser: 1.49 mg/dL — ABNORMAL HIGH (ref 0.61–1.24)
GFR, Estimated: 49 mL/min — ABNORMAL LOW (ref >60)
Glucose, Bld: 289 mg/dL — ABNORMAL HIGH (ref 70–99)
Potassium: 3.6 mmol/L (ref 3.5–5.1)
Sodium: 136 mmol/L (ref 135–145)

## 2023-11-24 MED ORDER — HYDROMORPHONE HCL 2 MG PO TABS
1.0000 mg | ORAL_TABLET | Freq: Once | ORAL | Status: AC
  Administered 2023-11-24: 1 mg via ORAL
  Filled 2023-11-24: qty 1

## 2023-11-24 MED ORDER — LINEZOLID 600 MG PO TABS: 600.0000 mg | ORAL_TABLET | Freq: Two times a day (BID) | ORAL | 0 refills | Status: DC

## 2023-11-24 MED ORDER — LACTATED RINGERS IV BOLUS
1000.0000 mL | Freq: Once | INTRAVENOUS | Status: AC
  Administered 2023-11-24: 1000 mL via INTRAVENOUS

## 2023-11-24 MED ORDER — LINEZOLID 600 MG PO TABS
600.0000 mg | ORAL_TABLET | Freq: Two times a day (BID) | ORAL | 0 refills | Status: DC
  Filled 2023-11-24: qty 12, 6d supply, fill #0

## 2023-11-24 MED ORDER — LINEZOLID 600 MG PO TABS
600.0000 mg | ORAL_TABLET | Freq: Two times a day (BID) | ORAL | Status: DC
  Administered 2023-11-24: 600 mg via ORAL
  Filled 2023-11-24: qty 1

## 2023-11-24 MED ORDER — ACETAMINOPHEN 500 MG PO TABS
1000.0000 mg | ORAL_TABLET | Freq: Four times a day (QID) | ORAL | 0 refills | Status: DC
  Filled 2023-11-24: qty 30, 4d supply, fill #0

## 2023-11-24 MED ORDER — ACETAMINOPHEN 500 MG PO TABS
1000.0000 mg | ORAL_TABLET | Freq: Four times a day (QID) | ORAL | Status: DC
  Administered 2023-11-24: 1000 mg via ORAL
  Filled 2023-11-24: qty 2

## 2023-11-24 MED ORDER — ACETAMINOPHEN 500 MG PO TABS
1000.0000 mg | ORAL_TABLET | Freq: Four times a day (QID) | ORAL | 0 refills | Status: AC
Start: 2023-11-24 — End: ?

## 2023-11-24 MED ORDER — ACETAMINOPHEN 650 MG RE SUPP: 650.0000 mg | Freq: Four times a day (QID) | RECTAL | Status: DC

## 2023-11-24 NOTE — Progress Notes (Signed)
 Received message from RN that patient was unable to pick up medications at [facility] pharmacy at discharge. He call to request medications to be sent to his preferred pharmacy: [facility] at [address]  Will send Linezolid  to this pharmacy. The [facility] pharmacy is closed tonight and on Sunday, so I will call the [facility] pharmacy to cancel this order on Monday, 7/212.  Attempted to call patient x2 to let him know about this switch. Will attempt again in the AM.  Hadassah Kristy Ahr, MD [facility] Internal Medicine Program - PGY-3 11/24/2023, 7:36 PM Page [phone].

## 2023-11-24 NOTE — Telephone Encounter (Signed)
 Patient called in to state that he was discharged but did not get any information, or paperwork. The patient doe snot have My chart. His prescriptions were sent to [facility] pharmacy, [facility] [address]. Called pharmacy and spoke to [place], she will [facility] [address] [facility]. Called the patient back to confirm that this had occurred.

## 2023-11-24 NOTE — Discharge Instructions (Signed)
 Thank you for allowing us  to be part of your care. You were hospitalized for cellulitis and an abscess in your elbow. We treated you with drainage of the abscess and antibiotics in your vein  See the changes in your medications and management of your chronic conditions below:  *For your infection -We have STARTED you on these following medications:  -Linezolid  600mg --take 1 tablet every 12 hours  -Tylenol  500mg --take up to 2 tablets every 6 hours as needed for pain    -Wound care--follow up with your PCP. While at home, make sure the wound stays covered, dry, and clean.  *For your Blood pressure -We have Paused the following medications:  -Lisinopril  40mg --Pause taking this until your PCP tells you to resume.  You developed a small kidney injury while in the hospital. Please pause taking your lisinopril  and avoid taking ibuprofen  until you follow up with your PCP. Drink plenty of fluids in the meantime.  -Please go to the [facility] clinic at Tuesday 7/22 at 8:30 for a lab appoint. We would like to check up on your kidneys   FOLLOW UP APPOINTMENTS: The [facility] clinic will call you to set up an appointment If your wound gets worse, please call the orthopedic surgeon.    Please make sure to keep your wound dry, covered, and clean  Please call your PCP or our clinic if you have any questions or concerns, we may be able to help and keep you from a long and expensive emergency room wait. Our clinic and after hours phone number is [phone]. The best time to call is Monday through Friday 9 am to 4 pm but there is always someone available 24/7 if you have an emergency. If you need medication refills please notify your pharmacy one week in advance and they will send us  a request.   We are glad you are feeling better,  Schuyler Novak, DO Internal Medicine Inpatient Teaching Service at [facility]

## 2023-11-24 NOTE — Telephone Encounter (Signed)
 From Previous clal, prescriptions to be sent to [facility] [address] [facility], [facility] change [facility] on [address]

## 2023-11-24 NOTE — Progress Notes (Signed)
 IV removed, AVS education provided, no questions

## 2023-11-24 NOTE — Discharge Summary (Signed)
 Name: QUINTON VOTH MRN: [id number] DOB: 1950/09/14 73 y.o. PCP: Nooruddin, Saad, MD  Date of Admission: 11/22/2023  3:17 PM Date of Discharge: 11/24/2023 Attending Physician: Dr. Mliss Foot  Discharge Diagnosis: 1. Principal Problem:   Cellulitis of right elbow Active Problems:   HIV disease (HCC)   Hyperlipidemia associated with type 2 diabetes mellitus (HCC)   PERIPHERAL VASCULAR DISEASE   GERD   Benign essential HTN   Type 2 DM with diabetic peripheral angiopathy w/o gangrene [facility])   Pancreatic insufficiency   Discharge Medications: Allergies as of 11/24/2023       Reactions   Sulfonamide Derivatives Hives        Medication List     PAUSE taking these medications    lisinopril  40 MG tablet Wait to take this until your doctor or other care provider tells you to start again. Commonly known as: ZESTRIL  TAKE 1 TABLET BY MOUTH DAILY. HOLD IF SYSTOLIC BLOOD PRESSURE IS LESS THAN 110       STOP taking these medications    ibuprofen  200 MG tablet Commonly known as: ADVIL    mupirocin  ointment 2 % Commonly known as: BACTROBAN        TAKE these medications    acetaminophen  500 MG tablet Commonly known as: TYLENOL  Take 2 tablets (1,000 mg total) by mouth every 6 (six) hours.   atorvastatin  40 MG tablet Commonly known as: LIPITOR TAKE 1 TABLET(40 MG) BY MOUTH DAILY   Bayer Microlet 2 Lancing Devic Misc Use to check blood sugar up to 3 ties a day   BD Pen Needle Nano U/F 32G X 4 MM Misc Generic drug: Insulin  Pen Needle 1 Device by Other route daily in the afternoon. USE AS DIRECTED FOUR TIMES DAILY. Dx code  E11.42   Biktarvy  50-200-25 MG Tabs tablet Generic drug: bictegravir-emtricitabine -tenofovir  AF Take 1 tablet by mouth daily.   Artist Use to Apply a new cecur simplicity patch as directed every 2 days   CeQur Simplicity 2U Devi Generic drug: injection device for insulin  change every 2 days   cilostazol  50 MG  tablet Commonly known as: PLETAL  TAKE 1 TABLET(50 MG) BY MOUTH TWICE DAILY   clopidogrel  75 MG tablet Commonly known as: PLAVIX  TAKE 1 TABLET(75 MG) BY MOUTH DAILY   Creon  36000-114000 units Cpep capsule Generic drug: lipase/protease/amylase TAKE 1 CAPSULE BY MOUTH THREE TIMES DAILY BEFORE A MEAL What changed: See the new instructions.   empagliflozin  25 MG Tabs tablet Commonly known as: Jardiance  Take 1 tablet (25 mg total) by mouth daily.   ezetimibe  10 MG tablet Commonly known as: ZETIA  TAKE 1 TABLET(10 MG) BY MOUTH DAILY   freestyle lancets Use 5 times daily to check blood sugar.   FreeStyle Libre 3 Plus Sensor Misc Change sensor every 15 days.   FREESTYLE LITE test strip Generic drug: glucose blood Use as instructed   FREESTYLE LITE test strip Generic drug: glucose blood Use 5 times daily to check blood sugar.   FreeStyle Lite w/Device Kit 1 Device by Does not apply route daily in the afternoon.   HumaLOG  100 UNIT/ML injection Generic drug: insulin  lispro Max daily 110 units per CeQur   Insulin  Syringe-Needle U-100 30G X 5/16 1 ML Misc Commonly known as: GNP Insulin  Syringes 1 Device by Does not apply route daily in the afternoon.   linezolid  600 MG tablet Commonly known as: ZYVOX  Take 1 tablet (600 mg total) by mouth every 12 (twelve) hours for 6 days.  omeprazole  40 MG capsule Commonly known as: PRILOSEC TAKE 1 CAPSULE(40 MG) BY MOUTH DAILY   pioglitazone  30 MG tablet Commonly known as: ACTOS  TAKE 1 TABLET(30 MG) BY MOUTH DAILY   tirzepatide  7.5 MG/0.5ML Pen Commonly known as: MOUNJARO  Inject 7.5 mg into the skin once a week.   Toujeo  Max SoloStar 300 UNIT/ML Solostar Pen Generic drug: insulin  glargine (2 Unit Dial) Inject 45 Units into the skin in the morning.               Discharge Care Instructions  (From admission, onward)           Start     Ordered   11/24/23 0000  Leave dressing on - Keep it clean, dry, and intact  until clinic visit        11/24/23 1151            Disposition and follow-up:   Mr.Richmond D Stay was discharged from [facility] in Good condition.  At the hospital follow up visit please address:  [place] pt is taking abx through 7/24 HTN--Lisinopril  paused due to AKI, evaluate if it can be resumed T2DM--Ensure adequate glycemic control  AKI--Developed 2/2 vancomycin  and poor oral intake. Recheck RFP  2.  Labs / imaging needed at time of follow-up: RFP  3.  Pending labs/ test needing follow-up: Wound Culture   Hospital Course by problem list: NGAI PARCELL is a 73 y.o. person living with a history of HIV who presented with RUE swelling and erythema and admitted for RUE cellulitis  now being discharged on hospital day 0 with the following pertinent hospital course:  RUE Cellulitis The pt presented to the ED on 7/17 with a two week history of RUE swelling and redness around the elbow. He also had a facial abscess with which he was seen at urgent care and given doxycycline . The facial abscess resolved, however his arm remained the same. Upon presentation a CT was performed that showed generalized soft tissue edema around proximal forearm and elbow with focal area suspicious for subcutaneous fluid as described. He was started on Vancomycin , Cefepime , and Flagyl . On 7/18 he was seen and his antibiotics were adjusted to Vancomycin . Orthopedics were consulted. After evaluation by ortho, a bedside I&D was performed and sent for culture. On 7/19 the preliminary culture showed gram positive cocci in pairs. He remained afebrile without leukocytosis. He was deemed fit for discharge with a 6 day course of linezolid .   Acute Kidney Injury Upon admission, his Cr was 0.44 with BUN 15. On 7/19, his creatinine rose to 1.49 with BUN 31. Potentially due to vancomycin  and decreased oral intake. He was given a 1L bolus LR and instructed to follow up on 7/22 in the [facility] clinic for  follow up labs. Lisinopril  and ibuprofen  held at discharge until instructed to restart by PCP    Stable chronic medical conditions: HIV HLD HTN  Subjective Pt seen and examined at the bedside this morning. He stated that he was feeling much better today but that his arm was still in pain. We discussed pain management and his preliminary culture results. He is comfortable going home today on oral antibiotics. All questions and concerns were addressed at this time.   Discharge Exam:   BP (!) 117/56 (BP Location: Left Arm)   Pulse 74   Temp 98.7 F (37.1 C)   Resp 18   Ht 6' 1 (1.854 m)   Wt 83.5 kg   SpO2 91%  BMI 24.28 kg/m  Discharge exam:  Const: Awake, alert in NAD HENT: Normocephalic, atraumatic, mucus membranes moist Card: RRR, No MRG, No pitting edema on LE's bilaterally  Resp: LCTAB, no increased work of breathing Abd: Soft, NTND, Bsx4 Extremities: Warm, pink. R BKA noted. RUE with acebandage to the elbow. Erythema noted to distal humerus.    Pertinent Labs, Studies, and Procedures:     Latest Ref Rng & Units 11/24/2023    4:54 AM 11/23/2023    5:00 AM 11/22/2023    3:36 PM  CBC  WBC 4.0 - 10.5 K/uL 6.7  9.2  9.0   Hemoglobin 13.0 - 17.0 g/dL 87.6  87.3  85.7   Hematocrit 39.0 - 52.0 % 37.2  38.4  43.7   Platelets 150 - 400 K/uL 272  241  301        Latest Ref Rng & Units 11/24/2023    4:54 AM 11/23/2023    5:00 AM 11/22/2023    3:36 PM  CMP  Glucose 70 - 99 mg/dL 710  794  797   BUN 8 - 23 mg/dL 31  15  14    Creatinine 0.61 - 1.24 mg/dL 8.50  9.00  8.92   Sodium 135 - 145 mmol/L 136  133  135   Potassium 3.5 - 5.1 mmol/L 3.6  3.6  3.6   Chloride 98 - 111 mmol/L 101  102  102   CO2 22 - 32 mmol/L 19  19  22    Calcium  8.9 - 10.3 mg/dL 9.4  8.8  9.2   Total Protein 6.5 - 8.1 g/dL   7.4   Total Bilirubin 0.0 - 1.2 mg/dL   0.8   Alkaline Phos 38 - 126 U/L   85   AST 15 - 41 U/L   16   ALT 0 - 44 U/L   14     CT EBLOW RIGHT W CONTRAST Result Date:  11/23/2023 CLINICAL DATA:  Soft tissue mass in the proximal right forearm EXAM: CT OF THE UPPER RIGHT EXTREMITY WITH CONTRAST TECHNIQUE: Multidetector CT imaging of the upper right extremity was performed according to the standard protocol following intravenous contrast administration. RADIATION DOSE REDUCTION: This exam was performed according to the departmental dose-optimization program which includes automated exposure control, adjustment of the mA and/or kV according to patient size and/or use of iterative reconstruction technique. CONTRAST:  75mL OMNIPAQUE  IOHEXOL  350 MG/ML SOLN COMPARISON:  None Available. FINDINGS: Bones/Joint/Cartilage Distal humerus appears within normal limits. The proximal radius and ulna are well visualized and within normal limits. No acute fracture or dislocation is seen. No joint effusion is seen. Ligaments Suboptimally assessed by CT. Muscles and Tendons Surrounding musculature of the right arm is within normal limits. Soft tissues Mild soft tissue edema is noted about the elbow joint. Specifically in the area of clinical concern there is focal prominent edema identified. Suggestion of small subcutaneous fluid collection measuring approximately 2.6 cm in length and approximately 1.1 cm in depth is noted. No other fluid collection is seen. IMPRESSION: No acute bony abnormality noted. Generalized soft tissue edema about the proximal forearm and elbow. In the area of clinical concern there is a focal area suspicious for subcutaneous fluid as described. Electronically Signed   By: Oneil Devonshire M.D.   On: 11/23/2023 02:59   DG Chest 2 View Result Date: 11/22/2023 CLINICAL DATA:  01364 Infection 01364 EXAM: CHEST - 2 VIEW COMPARISON:  June 21, 2021 FINDINGS: Streaky atelectasis in the left lung  base. No focal airspace consolidation, pleural effusion, or pneumothorax. No cardiomegaly. Aortic atherosclerosis. No acute fracture or destructive lesion. Multilevel degenerative disc  disease of the spine. IMPRESSION: No acute cardiopulmonary abnormality. Electronically Signed   By: Rogelia Myers M.D.   On: 11/22/2023 16:21     Discharge Instructions: Discharge Instructions     Call MD for:  redness, tenderness, or signs of infection (pain, swelling, redness, odor or green/yellow discharge around incision site)   Complete by: As directed    Call MD for:  severe uncontrolled pain   Complete by: As directed    Call MD for:  temperature >100.4   Complete by: As directed    Diet - low sodium heart healthy   Complete by: As directed    Discharge instructions   Complete by: As directed    Thank you for allowing us  to be part of your care. You were hospitalized for cellulitis and an abscess in your elbow. We treated you with drainage of the abscess and antibiotics in your vein  See the changes in your medications and management of your chronic conditions below:  *For your infection -We have STARTED you on these following medications:  -Linezolid  600mg --take 1 tablet every 12 hours  -Tylenol  500mg --take up to 2 tablets every 6 hours as needed for pain    -Wound care--follow up with your PCP. While at home, make sure the wound stays covered, dry, and clean.  *For your Blood pressure -We have Paused the following medications:  -Lisinopril  40mg --Pause taking this until your PCP tells you to resume.  You developed a small kidney injury while in the hospital. Please pause taking your lisinopril  and avoid taking ibuprofen  until you follow up with your PCP. Drink plenty of fluids in the meantime.  -Please go to the [facility] clinic at Tuesday 7/22 at 8:30 for a lab appoint. We would like to check up on your kidneys   FOLLOW UP APPOINTMENTS: The [facility] clinic will call you to set up an appointment If your wound gets worse, please call the orthopedic surgeon.    Please make sure to keep your wound dry, covered, and clean  Please call your PCP or our clinic if you have any  questions or concerns, we may be able to help and keep you from a long and expensive emergency room wait. Our clinic and after hours phone number is [phone]. The best time to call is Monday through Friday 9 am to 4 pm but there is always someone available 24/7 if you have an emergency. If you need medication refills please notify your pharmacy one week in advance and they will send us  a request.   We are glad you are feeling better,  Schuyler Novak, DO Internal Medicine Inpatient Teaching Service at [facility]   Increase activity slowly   Complete by: As directed    Leave dressing on - Keep it clean, dry, and intact until clinic visit   Complete by: As directed        Signed: Novak Schuyler, DO 11/24/2023, 12:10 PM

## 2023-11-25 ENCOUNTER — Telehealth: Payer: Self-pay

## 2023-11-25 NOTE — Telephone Encounter (Signed)
 Received a message from last evening regarding the patient and medications. Called [facility] on [address] and verified that scripts from the ED tare there and ready

## 2023-11-25 NOTE — Addendum Note (Signed)
 Addended by: Westyn Keatley on: 11/25/2023 06:39 PM   Modules accepted: Level of Service

## 2023-11-25 NOTE — Progress Notes (Signed)
Internal Medicine Clinic Attending  I was physically present during the key portions of the resident provided service and participated in the medical decision making of patient's management care. I reviewed pertinent patient test results.  The assessment, diagnosis, and plan were formulated together and I agree with the documentation in the resident's note.  Narendra, Nischal, MD  

## 2023-11-26 ENCOUNTER — Ambulatory Visit: Payer: Self-pay | Admitting: Internal Medicine

## 2023-11-26 ENCOUNTER — Telehealth: Payer: Self-pay

## 2023-11-26 ENCOUNTER — Other Ambulatory Visit: Payer: Self-pay | Admitting: Student

## 2023-11-26 DIAGNOSIS — N179 Acute kidney failure, unspecified: Secondary | ICD-10-CM

## 2023-11-26 LAB — AEROBIC CULTURE W GRAM STAIN (SUPERFICIAL SPECIMEN)

## 2023-11-26 NOTE — Transitions of Care (Post Inpatient/ED Visit) (Signed)
 11/26/2023  Name: Nathan [name] MRN: [id number] DOB: [birth year]  Today's TOC FU Call Status: Today's TOC FU Call Status:: Successful TOC FU Call Completed TOC FU Call Complete Date: 11/26/23 Patient's Name and Date of Birth confirmed.  Transition Care Management Follow-up Telephone Call Date of Discharge: 11/24/23 Discharge Facility: [facility]) Type of Discharge: Inpatient Admission Primary Inpatient Discharge Diagnosis:: cellulitis How have you been since you were released from the hospital?: Better Any questions or concerns?: No  Items Reviewed: Did you receive and understand the discharge instructions provided?: Yes Medications obtained,verified, and reconciled?: Yes (Medications Reviewed) Any new allergies since your discharge?: No Dietary orders reviewed?: Yes Do you have support at home?: No  Medications Reviewed Today: Medications Reviewed Today     Reviewed by Emmitt Pan, LPN (Licensed Practical Nurse) on 11/26/23 at 1653  Med List Status: <None>   Medication Order Taking? Sig Documenting Provider Last Dose Status Informant  acetaminophen  (TYLENOL ) 500 MG tablet [id number] Yes Take 2 tablets (1,000 mg total) by mouth every 6 (six) hours. Gomez-Caraballo, Maria, MD  Active   atorvastatin  (LIPITOR) 40 MG tablet [id number] Yes TAKE 1 TABLET(40 MG) BY MOUTH DAILY Shamleffer, Ibtehal Jaralla, MD  Active   bictegravir-emtricitabine -tenofovir  AF (BIKTARVY ) 50-200-25 MG TABS tablet [id number] Yes Take 1 tablet by mouth daily. Fleeta Rothman, Jomarie SAILOR, MD  Active Self, Pharmacy Records  Blood Glucose Monitoring Suppl (FREESTYLE LITE) w/Device KIT [id number] Yes 1 Device by Does not apply route daily in the afternoon. Shamleffer, Donell Cardinal, MD  Active Self, Pharmacy Records  cilostazol  (PLETAL ) 50 MG tablet [id number] Yes TAKE 1 TABLET(50 MG) BY MOUTH TWICE DAILY Nooruddin, Saad, MD  Active Self, Pharmacy Records  clopidogrel  (PLAVIX ) 75 MG tablet [id number] Yes TAKE 1  TABLET(75 MG) BY MOUTH DAILY Nooruddin, Saad, MD  Active Self, Pharmacy Records  Continuous Glucose Sensor (FREESTYLE LIBRE 3 PLUS SENSOR) [place] [id number] Yes Change sensor every 15 days. Shamleffer, Donell Cardinal, MD  Active Self, Pharmacy Records  empagliflozin  (JARDIANCE ) 25 MG TABS tablet [id number] Yes Take 1 tablet (25 mg total) by mouth daily. Shamleffer, Donell Cardinal, MD  Active Self, Pharmacy Records  ezetimibe  (ZETIA ) 10 MG tablet [id number] Yes TAKE 1 TABLET(10 MG) BY MOUTH DAILY Nooruddin, Saad, MD  Active Self, Pharmacy Records  glucose blood (FREESTYLE LITE) test strip [id number] Yes Use as instructed Nooruddin, Saad, MD  Active Self, Pharmacy Records  glucose blood (FREESTYLE LITE) test strip [id number] Yes Use 5 times daily to check blood sugar. Gregary Sharper, MD  Active Self, Pharmacy Records  HUMALOG  100 UNIT/ML injection [id number] Yes Max daily 110 units per CeQur Shamleffer, Ibtehal Jaralla, MD  Active Self, Pharmacy Records  injection device for insulin  [facility] SIMPLICITY 2U) DEVI [id number] Yes change every 2 days Shamleffer, Ibtehal Jaralla, MD  Active Self, Pharmacy Records  Injection Device for Insulin  [facility] SIMPLICITY INSERTER) MISC [id number] Yes Use to Apply a new cecur simplicity patch as directed every 2 days Rudy Sieving, MD  Active Self, Pharmacy Records  insulin  glargine, 2 Unit Dial, (TOUJEO  MAX SOLOSTAR) 300 UNIT/ML Solostar Pen [id number] Yes Inject 45 Units into the skin in the morning. Shamleffer, Donell Cardinal, MD  Active Self, Pharmacy Records  Insulin  Pen Needle (BD PEN NEEDLE NANO U/F) 32G X 4 MM MISC [id number] Yes 1 Device by Other route daily in the afternoon. USE AS DIRECTED FOUR TIMES DAILY. Dx code  Z88.57 Shamleffer, Ibtehal Jaralla, MD  Active Self, Pharmacy Records  Insulin  Syringe-Needle U-100 (GNP INSULIN  SYRINGES) 30G X 5/16  1 ML MISC [id number] Yes 1 Device by Does not apply route daily in the afternoon. Shamleffer, Donell Cardinal, MD  Active Self,  Pharmacy Records  Lancet Devices (BAYER MICROLET 2 LANCING [facility]) MISC [id number] Yes Use to check blood sugar up to 3 ties a day Prentiss Prentice SAILOR, DO  Active Self, Pharmacy Records  Lancets (FREESTYLE) lancets [id number] Yes Use 5 times daily to check blood sugar. Gawaluck, Greylon, MD  Active Self, Pharmacy Records  linezolid  (ZYVOX ) 600 MG tablet [id number] Yes Take 1 tablet (600 mg total) by mouth every 12 (twelve) hours for 6 days. Elnora Ip, MD  Active   lipase/protease/amylase (CREON ) 36000 UNITS CPEP capsule [id number] Yes TAKE 1 CAPSULE BY MOUTH THREE TIMES DAILY BEFORE A MEAL  Patient taking differently: Take 36,000 Units by mouth 2 (two) times daily with a meal.   Nooruddin, Roetta, MD  Active Self, Pharmacy Records  lisinopril  (ZESTRIL ) 40 MG tablet [id number]  TAKE 1 TABLET BY MOUTH DAILY. HOLD IF SYSTOLIC BLOOD PRESSURE IS LESS THAN 110  Patient not taking: Reported on 11/26/2023   Nooruddin, Saad, MD  Active Self, Pharmacy Records  omeprazole  [facility]) 40 MG capsule [id number] Yes TAKE 1 CAPSULE(40 MG) BY MOUTH DAILY Nooruddin, Saad, MD  Active Self, Pharmacy Records  pioglitazone  (ACTOS ) 30 MG tablet [id number] Yes TAKE 1 TABLET(30 MG) BY MOUTH DAILY Shamleffer, Ibtehal Jaralla, MD  Active Self, Pharmacy Records  tirzepatide  (MOUNJARO ) 7.5 MG/0.5ML Pen [id number] Yes Inject 7.5 mg into the skin once a week. Shamleffer, Donell Cardinal, MD  Active Self, Pharmacy Records            Home Care and Equipment/Supplies: Were Home Health Services Ordered?: NA Any new equipment or medical supplies ordered?: NA  Functional Questionnaire: Do you need assistance with bathing/showering or dressing?: No Do you need assistance with meal preparation?: No Do you need assistance with eating?: No Do you have difficulty maintaining continence: No Do you need assistance with getting out of bed/getting out of a chair/moving?: No Do you have difficulty managing or taking your medications?:  No  Follow up appointments reviewed: PCP Follow-up appointment confirmed?: Yes Date of PCP follow-up appointment?: 12/04/23 Follow-up Provider: [facility] Follow-up appointment confirmed?: NA Do you need transportation to your follow-up appointment?: No Do you understand care options if your condition(s) worsen?: Yes-patient verbalized understanding    SIGNATURE Julian Lemmings, LPN [facility] Nurse Health Advisor Direct Dial [phone]

## 2023-11-26 NOTE — Progress Notes (Signed)
 Patient discharged from hospital on 11/24/2023. Mild AKI noted prior to discharge. Patient was given a 1L bolus of LR and instructed to hold Lisinopril  until he followed up at [facility] for lab only visit.   Called patient today, 11/26/23. He reports improvement, mild pain (has not been taking Tylenol ). She picked up Linezolid  on Sunday 7/20 and has been adherent to it.   Will send BMP future order for Tuesday, 7/22. Dr. Lovie, our attending over the weekend, agreed to follow up labs during clinic visit on Tuesday.   Patient will also need appointment in the clinic in the next 7-10 days. Will message clinic admin team to facilitate.   Hadassah Kristy Ahr, MD [facility] Internal Medicine Program - PGY-3 11/26/2023, 11:37 AM Pager# [phone].

## 2023-11-27 ENCOUNTER — Other Ambulatory Visit: Payer: Self-pay

## 2023-11-27 ENCOUNTER — Encounter (HOSPITAL_COMMUNITY): Payer: Self-pay | Admitting: Internal Medicine

## 2023-11-27 ENCOUNTER — Observation Stay (HOSPITAL_COMMUNITY)
Admission: RE | Admit: 2023-11-27 | Discharge: 2023-11-28 | Disposition: A | Discharge status: Home / Self Care | Payer: Medicare (Managed Care) | Source: Ambulatory Visit | Attending: Internal Medicine | Admitting: Internal Medicine

## 2023-11-27 ENCOUNTER — Observation Stay (HOSPITAL_COMMUNITY): Payer: Medicare (Managed Care)

## 2023-11-27 ENCOUNTER — Encounter (HOSPITAL_COMMUNITY): Payer: Self-pay

## 2023-11-27 ENCOUNTER — Ambulatory Visit: Payer: Medicare (Managed Care) | Admitting: Student

## 2023-11-27 VITALS — BP 159/63 | HR 72 | Temp 97.6°F | Ht 73.0 in | Wt 186.0 lb

## 2023-11-27 DIAGNOSIS — Z794 Long term (current) use of insulin: Secondary | ICD-10-CM | POA: Diagnosis not present

## 2023-11-27 DIAGNOSIS — I10 Essential (primary) hypertension: Secondary | ICD-10-CM | POA: Diagnosis not present

## 2023-11-27 DIAGNOSIS — I739 Peripheral vascular disease, unspecified: Secondary | ICD-10-CM | POA: Diagnosis present

## 2023-11-27 DIAGNOSIS — E1151 Type 2 diabetes mellitus with diabetic peripheral angiopathy without gangrene: Secondary | ICD-10-CM | POA: Diagnosis present

## 2023-11-27 DIAGNOSIS — K219 Gastro-esophageal reflux disease without esophagitis: Secondary | ICD-10-CM | POA: Diagnosis present

## 2023-11-27 DIAGNOSIS — B20 Human immunodeficiency virus [HIV] disease: Secondary | ICD-10-CM | POA: Diagnosis not present

## 2023-11-27 DIAGNOSIS — L03113 Cellulitis of right upper limb: Secondary | ICD-10-CM | POA: Diagnosis not present

## 2023-11-27 DIAGNOSIS — Z7982 Long term (current) use of aspirin: Secondary | ICD-10-CM | POA: Insufficient documentation

## 2023-11-27 DIAGNOSIS — Z7902 Long term (current) use of antithrombotics/antiplatelets: Secondary | ICD-10-CM | POA: Diagnosis not present

## 2023-11-27 DIAGNOSIS — K8681 Exocrine pancreatic insufficiency: Secondary | ICD-10-CM | POA: Diagnosis not present

## 2023-11-27 DIAGNOSIS — Z9889 Other specified postprocedural states: Secondary | ICD-10-CM | POA: Insufficient documentation

## 2023-11-27 DIAGNOSIS — Z79899 Other long term (current) drug therapy: Secondary | ICD-10-CM | POA: Diagnosis not present

## 2023-11-27 DIAGNOSIS — R234 Changes in skin texture: Secondary | ICD-10-CM | POA: Insufficient documentation

## 2023-11-27 DIAGNOSIS — K8689 Other specified diseases of pancreas: Secondary | ICD-10-CM | POA: Diagnosis present

## 2023-11-27 LAB — BASIC METABOLIC PANEL WITH GFR
Anion gap: 11 (ref 5–15)
BUN: 9 mg/dL (ref 8–23)
CO2: 24 mmol/L (ref 22–32)
Calcium: 9.1 mg/dL (ref 8.9–10.3)
Chloride: 106 mmol/L (ref 98–111)
Creatinine, Ser: 0.9 mg/dL (ref 0.61–1.24)
GFR, Estimated: >60 mL/min (ref >60)
Glucose, Bld: 165 mg/dL — ABNORMAL HIGH (ref 70–99)
Potassium: 4 mmol/L (ref 3.5–5.1)
Sodium: 141 mmol/L (ref 135–145)

## 2023-11-27 LAB — CBC
HCT: 38.9 % — ABNORMAL LOW (ref 39.0–52.0)
Hemoglobin: 12.8 g/dL — ABNORMAL LOW (ref 13.0–17.0)
MCH: 30.1 pg (ref 26.0–34.0)
MCHC: 32.9 g/dL (ref 30.0–36.0)
MCV: 91.5 fL (ref 80.0–100.0)
Platelets: 239 K/uL (ref 150–400)
RBC: 4.25 MIL/uL (ref 4.22–5.81)
RDW: 13.8 % (ref 11.5–15.5)
WBC: 6 K/uL (ref 4.0–10.5)
nRBC: 0 % (ref 0.0–0.2)

## 2023-11-27 LAB — GLUCOSE, CAPILLARY
Glucose-Capillary: 199 mg/dL — ABNORMAL HIGH (ref 70–99)
Glucose-Capillary: 186 mg/dL — ABNORMAL HIGH (ref 70–99)
Glucose-Capillary: 279 mg/dL — ABNORMAL HIGH (ref 70–99)

## 2023-11-27 MED ORDER — CLOPIDOGREL BISULFATE 75 MG PO TABS
75.0000 mg | ORAL_TABLET | Freq: Every day | ORAL | Status: DC
  Administered 2023-11-28: 75 mg via ORAL
  Filled 2023-11-27: qty 1

## 2023-11-27 MED ORDER — ATORVASTATIN CALCIUM 40 MG PO TABS
40.0000 mg | ORAL_TABLET | Freq: Every day | ORAL | Status: DC
  Administered 2023-11-28: 40 mg via ORAL
  Filled 2023-11-27: qty 1

## 2023-11-27 MED ORDER — IBUPROFEN 400 MG PO TABS: 400.0000 mg | ORAL_TABLET | Freq: Four times a day (QID) | ORAL | Status: DC | PRN

## 2023-11-27 MED ORDER — ENOXAPARIN SODIUM 40 MG/0.4ML IJ SOSY
40.0000 mg | PREFILLED_SYRINGE | INTRAMUSCULAR | Status: DC
  Administered 2023-11-27: 40 mg via SUBCUTANEOUS
  Filled 2023-11-27: qty 0.4

## 2023-11-27 MED ORDER — PANTOPRAZOLE SODIUM 40 MG PO TBEC
40.0000 mg | DELAYED_RELEASE_TABLET | Freq: Every day | ORAL | Status: DC
  Administered 2023-11-28: 40 mg via ORAL
  Filled 2023-11-27: qty 1

## 2023-11-27 MED ORDER — ACETAMINOPHEN 500 MG PO TABS: 1000.0000 mg | ORAL_TABLET | Freq: Four times a day (QID) | ORAL | Status: DC

## 2023-11-27 MED ORDER — GADOBUTROL 1 MMOL/ML IV SOLN
8.0000 mL | Freq: Once | INTRAVENOUS | Status: AC | PRN
  Administered 2023-11-27: 8 mL via INTRAVENOUS

## 2023-11-27 MED ORDER — INSULIN GLARGINE-YFGN 100 UNIT/ML ~~LOC~~ SOLN
30.0000 [IU] | Freq: Every day | SUBCUTANEOUS | Status: DC
  Administered 2023-11-28: 30 [IU] via SUBCUTANEOUS
  Filled 2023-11-27: qty 0.3

## 2023-11-27 MED ORDER — PANCRELIPASE (LIP-PROT-AMYL) 36000-114000 UNITS PO CPEP
36000.0000 [IU] | ORAL_CAPSULE | Freq: Three times a day (TID) | ORAL | Status: DC
  Administered 2023-11-27 – 2023-11-28 (×3): 36000 [IU] via ORAL
  Filled 2023-11-27 (×3): qty 1

## 2023-11-27 MED ORDER — LINEZOLID 600 MG PO TABS
600.0000 mg | ORAL_TABLET | Freq: Two times a day (BID) | ORAL | Status: DC
  Administered 2023-11-27 – 2023-11-28 (×2): 600 mg via ORAL
  Filled 2023-11-27 (×3): qty 1

## 2023-11-27 MED ORDER — INSULIN ASPART 100 UNIT/ML IJ SOLN
0.0000 [IU] | Freq: Three times a day (TID) | INTRAMUSCULAR | Status: DC
  Administered 2023-11-27 – 2023-11-28 (×3): 3 [IU] via SUBCUTANEOUS

## 2023-11-27 MED ORDER — BICTEGRAVIR-EMTRICITAB-TENOFOV 50-200-25 MG PO TABS
1.0000 | ORAL_TABLET | Freq: Every day | ORAL | Status: DC
  Administered 2023-11-28: 1 via ORAL
  Filled 2023-11-27: qty 1

## 2023-11-27 MED ORDER — EZETIMIBE 10 MG PO TABS
10.0000 mg | ORAL_TABLET | Freq: Every day | ORAL | Status: DC
  Administered 2023-11-28: 10 mg via ORAL
  Filled 2023-11-27: qty 1

## 2023-11-27 NOTE — Assessment & Plan Note (Signed)
 First present about three weeks ago and developed along with his other symptoms (right upper extremity cellulitis). Seems to be growing and now has erythema around it as well. It's not tender, and doesn't seem to bother him.  He does have a significant history of PAD, and his last ABI's on his left leg showed on 12/2022 showed moderate arterial disease. He is due for repeat ABI's on August 4th, but wonder if this is an ischemic eschar. Would recommend repeating ABI's while hospitalized.

## 2023-11-27 NOTE — Assessment & Plan Note (Signed)
>>  ASSESSMENT AND PLAN FOR CELLULITIS OF RIGHT ELBOW WRITTEN ON 11/27/2023 11:14 AM BY NOORUDDIN, SAAD, MD  Pt presents for hospital follow up for his right elbow cellulitis. He was recently discharged on 7/19 for this, and he was sent home with Linezolid . He states he has not been feeling any better, and is still having significant pain in that right upper extremity. He has been taking his linezolid  as prescribed, and his wound cultures from the hospital show MRSA, thankfully blood cultures have been negative so far.   On my exam, there is still significant purulence on his right upper extremity wound from where the incision and drainage took place. Questionable if there is a new abscess forming underneath all of that. Thankfully, his systemic symptoms have resolved. He also has a new abscess on his tricep area from where his CGM was placed that he had removed.   Overall, unclear why he has been having these recurrent abscesses. His last CD4 count was within normal limits, and HIV RNA count was low. He does not use IV drugs per pt reports, and he isn't bacteremic from blood cultures obtained during his previous hospitalization. Please see photos under physical exam section as well.   I think he would benefit from a few more days of IV antibiotics given the severity of his wound, as well as the new abscess formation, so will be directly admitting him.   Plan:  - Directly admit  - Consider repeat imaging, ortho consult, and IV antibiotics

## 2023-11-27 NOTE — Assessment & Plan Note (Addendum)
 Pt presents for hospital follow up for his right elbow cellulitis. He was recently discharged on 7/19 for this, and he was sent home with Linezolid . He states he has not been feeling any better, and is still having significant pain in that right upper extremity. He has been taking his linezolid  as prescribed, and his wound cultures from the hospital show MRSA, thankfully blood cultures have been negative so far.   On my exam, there is still significant purulence on his right upper extremity wound from where the incision and drainage took place. Questionable if there is a new abscess forming underneath all of that. Thankfully, his systemic symptoms have resolved. He also has a new abscess on his tricep area from where his CGM was placed that he had removed.   Overall, unclear why he has been having these recurrent abscesses. His last CD4 count was within normal limits, and HIV RNA count was low. He does not use IV drugs per pt reports, and he isn't bacteremic from blood cultures obtained during his previous hospitalization. Please see photos under physical exam section as well.   I think he would benefit from a few more days of IV antibiotics given the severity of his wound, as well as the new abscess formation, so will be directly admitting him.   Plan:  - Directly admit  - Consider repeat imaging, ortho consult, and IV antibiotics

## 2023-11-27 NOTE — H&P (Addendum)
 Date: 11/27/2023         Patient Name:  Nathan [name] MRN: [id number]  DOB: 02/18/1951 Age / Sex: 73 y.o., male   PCP: Nelia Dirks, MD         Medical Service: Internal Medicine Teaching Service         Attending Physician: Dr. Rosan Dayton BROCKS, DO    First Contact: Dr. Myrna Pager: [phone]  Second Contact: Dr. Elnora Pager: [phone]                 Chief Concern: Nonhealing right forearm wound  History of Present Illness: 73 year old seen as a direct admission from primary care clinic due to nonhealing wound on the right forearm.  Symptoms started weeks ago and progressively worsened, culminating in an abscess on the right forearm.  He was hospitalized briefly for this a few days ago.  The abscess was incised and drained.  Broad-spectrum antibiotics were started.  Cultures from the wound grew MRSA sensitive to linezolid  and he was discharged on linezolid  to take outside of the hospital.  At follow-up in primary care clinic ongoing purulent drainage was noted and because the patient did not feel the wound had healed very much readmission to the hospital was recommended.  Medical history is notable for HIV well-controlled undetectable for years, PAD status post right lower extremity AKA for chronic limb ischemia despite bypass, also left carotid endarterectomy and left lower extremity popliteal stenting.  He has type 2 diabetes on long-term insulin  and pancreatic insufficiency.  He has had diffuse boils before but they went away and several years passed before recurrence of this episode.  He reports good adherence to his medications.  When he is healthy and feeling well he enjoys working in the yard.  He was going to do some yard work this afternoon until admission to the hospital was recommended for his arm wound.  Review of Systems  Constitutional:  Negative for chills and fever.  Respiratory:  Negative for cough and shortness of breath.   Skin:  Negative for rash.        Scattered boils.    Allergies: Allergies  Allergen Reactions   Sulfonamide Derivatives Hives    Past Medical History: Per above  Medications: No current facility-administered medications on file prior to encounter.   Current Outpatient Medications on File Prior to Encounter  Medication Sig Dispense Refill   acetaminophen  (TYLENOL ) 500 MG tablet Take 2 tablets (1,000 mg total) by mouth every 6 (six) hours. 30 tablet 0   atorvastatin  (LIPITOR) 40 MG tablet TAKE 1 TABLET(40 MG) BY MOUTH DAILY 90 tablet 1   bictegravir-emtricitabine -tenofovir  AF (BIKTARVY ) 50-200-25 MG TABS tablet Take 1 tablet by mouth daily. 30 tablet 11   Blood Glucose Monitoring Suppl (FREESTYLE LITE) w/Device KIT 1 Device by Does not apply route daily in the afternoon. 1 kit 0   cilostazol  (PLETAL ) 50 MG tablet TAKE 1 TABLET(50 MG) BY MOUTH TWICE DAILY 60 tablet 3   clopidogrel  (PLAVIX ) 75 MG tablet TAKE 1 TABLET(75 MG) BY MOUTH DAILY 90 tablet 3   Continuous Glucose Sensor (FREESTYLE LIBRE 3 PLUS SENSOR) MISC Change sensor every 15 days. 6 each 3   empagliflozin  (JARDIANCE ) 25 MG TABS tablet Take 1 tablet (25 mg total) by mouth daily. 90 tablet 3   ezetimibe  (ZETIA ) 10 MG tablet TAKE 1 TABLET(10 MG) BY MOUTH DAILY 90 tablet 2   glucose blood (FREESTYLE LITE) test strip Use as instructed 100 each 12  glucose blood (FREESTYLE LITE) test strip Use 5 times daily to check blood sugar. 450 each 3   HUMALOG  100 UNIT/ML injection Max daily 110 units per CeQur 110 mL 3   injection device for insulin  (CEQUR SIMPLICITY 2U) DEVI change every 2 days 45 each 3   Injection Device for Insulin  (CEQUR SIMPLICITY INSERTER) MISC Use to Apply a new cecur simplicity patch as directed every 2 days 1 each 1   insulin  glargine, 2 Unit Dial, (TOUJEO  MAX SOLOSTAR) 300 UNIT/ML Solostar Pen Inject 45 Units into the skin in the morning. 15 mL 4   Insulin  Pen Needle (BD PEN NEEDLE NANO U/F) 32G X 4 MM MISC 1 Device by Other route daily in the  afternoon. USE AS DIRECTED FOUR TIMES DAILY. Dx code  E11.42 100 each 3   Insulin  Syringe-Needle U-100 (GNP INSULIN  SYRINGES) 30G X 5/16 1 ML MISC 1 Device by Does not apply route daily in the afternoon. 100 each 3   Lancet Devices (BAYER MICROLET 2 LANCING DEVIC) MISC Use to check blood sugar up to 3 ties a day 1 each 2   Lancets (FREESTYLE) lancets Use 5 times daily to check blood sugar. 450 each 3   linezolid  (ZYVOX ) 600 MG tablet Take 1 tablet (600 mg total) by mouth every 12 (twelve) hours for 6 days. 12 tablet 0   lipase/protease/amylase (CREON ) 36000 UNITS CPEP capsule TAKE 1 CAPSULE BY MOUTH THREE TIMES DAILY BEFORE A MEAL (Patient taking differently: Take 36,000 Units by mouth 2 (two) times daily with a meal.) 200 capsule 0   [Paused] lisinopril  (ZESTRIL ) 40 MG tablet TAKE 1 TABLET BY MOUTH DAILY. HOLD IF SYSTOLIC BLOOD PRESSURE IS LESS THAN 110 (Patient not taking: Reported on 11/26/2023) 90 tablet 3   omeprazole  (PRILOSEC) 40 MG capsule TAKE 1 CAPSULE(40 MG) BY MOUTH DAILY 90 capsule 1   pioglitazone  (ACTOS ) 30 MG tablet TAKE 1 TABLET(30 MG) BY MOUTH DAILY 90 tablet 3   tirzepatide  (MOUNJARO ) 7.5 MG/0.5ML Pen Inject 7.5 mg into the skin once a week. 6 mL 3     Surgical History: Per above  Family History:  Reviewed, no family history of boils or abscesses  Social History:  Per above  Physical Exam: Blood pressure (!) 147/100, temperature 98.3 F (36.8 C), temperature source Oral, resp. rate 16, height 6' 1 (1.854 m), weight 83.9 kg, SpO2 98%.  No distress, bandage on right arm Moist pink oral mucosa Heart rate and rhythm is normal, radial pulses are strong bilaterally, weakly palpable left PT pulse, left femoral pulses palpable, left femoral bruit present, normal cap refill in left lower extremity Breathing comfortably, anterior lung fields are clear Right AKA with prosthesis Normal range of motion of right elbow, no effusion or tenderness Abdomen is nontender, bolus insulin   patch on abdomen Skin is warm and dry, no splinter hemorrhages or lesions/nodules in hands or foot Right proximal forearm wound with some fibrinous looking debris in the center, induration and redness around it, it is tender Scattered boils Hemorrhagic appearing boil on the left leg with surrounding erythema, nontender Alert and oriented, speech is normal, no facial asymmetry, moves all extremities normally  EKG:  N/A  Labs: Creatinine baseline No leukocytosis Mild anemia HIV RNA undetectable CD4 barely detectable in January 2025 CD4 count 510 in July 2025  Images and other studies: N/A  Assessment & Plan:  YUG LORIA is a 73 y.o. with well-controlled HIV, diabetes, and PAD who presents for a nonhealing wound on his  right forearm.  Principal Problem:   Right arm cellulitis Not healing as expected despite appropriate therapy thus far.  He is on appropriate antibiotics.  His wound which was left to heal by secondary intention has some fibrinous appearing debris in the center.  I do not appreciate any fluctuance around it, doubt there is anything to be drained.  He is not septic or systemically ill from this, in fact he feels well except for some arm pain.  Will continue linezolid .  Getting MRI of the right forearm to assess for deeper infection.  The diffuse boils make me think about septic emboli but I think this is unlikely in this well-appearing patient without growth from his prior blood cultures, these are still pending but will finalize today or tomorrow. - Continue linezolid  - MRI right forearm  Active Problems:   PAD Chronic and severe but stable.  Status post left carotid endarterectomy and left lower extremity popliteal stenting.  His right AKA is due to complications from PAD.  His left leg looks well-perfused but given the new painless ulcer on his calf there I am requesting left-sided ABIs.    HIV disease (HCC) Without immunocompromise.  Reports 30 to 40-year  history of HIV, undetectable nearly the entire time.  Continue outpatient Biktarvy .    GERD Chronic, well-controlled on outpatient PPI.    Type 2 DM with diabetic peripheral angiopathy w/o gangrene (HCC) With hyperglycemia.  A1c >8.  On long-term insulin .  Continue insulin  at reduced dose plus mealtime correctional insulin .    Pancreatic insufficiency Symptoms well-controlled with pancreatic enzyme replacement.  Continue outpatient Creon .  Level of care: MedSurg Diet: Regular IVF: N/A VTE: enoxaparin  (LOVENOX ) injection 40 mg Start: 11/27/23 2200 Code: Full, but would not want prolonged support if survival outside of the hospital setting was unlikely Surrogate: Brother, Ozell Batty  Signed: Ozell Kung MD 11/27/2023, 3:31 PM Pager: [phone]

## 2023-11-27 NOTE — Plan of Care (Signed)
 ?  Problem: Clinical Measurements: ?Goal: Ability to avoid or minimize complications of infection will improve ?Outcome: Progressing ?  ?Problem: Skin Integrity: ?Goal: Skin integrity will improve ?Outcome: Progressing ?  ?

## 2023-11-27 NOTE — Progress Notes (Signed)
 CC: HFU  HPI:  Nathan [name] is a 73 y.o. male living with a history stated below and presents today for hospital follow up. Please see problem based assessment and plan for additional details.    Past Medical History:  Diagnosis Date   Asthma    per 2003 [facility] pulm records pfts    Blood dyscrasia    HIV   Carotid artery occlusion    Clotting disorder (HCC)    Colon polyps    noted previous colonoscopy [facility]   DDD (degenerative disc disease)    cervical spine   Depression    Diabetes mellitus    dx 2010   Diarrhea 02/24/2021   Dizziness 08/25/2021   Falls 08/25/2021   Fever    unknwon origin   GERD (gastroesophageal reflux disease)    Head swelling 05/23/2017   Hep C w/o coma, chronic (HCC)    History of syphilis    noted [facility] records   HIV infection (HCC)    undetectable viral load and CD4 ct 667 as of 11/2011   Hyperlipidemia    Hypertension    MRSA (methicillin resistant Staphylococcus aureus)    Multiple sclerosis (HCC)    in remission as of 11/2011 (diagnosed late 1980s)   Pain in limb-Right Leg 10/13/2013   Pancreas (digestive gland) works poorly    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HCC)    2002   Pneumonia    Prosthesis fitting 03/10/2019   PVD (peripheral vascular disease) (HCC)    Left Stent 07/02/2008   Scalp lesion 07/26/2017   UTI (urinary tract infection)    Wears dentures     Current Outpatient Medications on File Prior to Visit  Medication Sig Dispense Refill   acetaminophen  (TYLENOL ) 500 MG tablet Take 2 tablets (1,000 mg total) by mouth every 6 (six) hours. 30 tablet 0   atorvastatin  (LIPITOR) 40 MG tablet TAKE 1 TABLET(40 MG) BY MOUTH DAILY 90 tablet 1   bictegravir-emtricitabine -tenofovir  AF (BIKTARVY ) 50-200-25 MG TABS tablet Take 1 tablet by mouth daily. 30 tablet 11   Blood Glucose Monitoring Suppl (FREESTYLE LITE) w/Device KIT 1 Device by Does not apply route daily in the afternoon. 1 kit 0   cilostazol  (PLETAL ) 50 MG tablet  TAKE 1 TABLET(50 MG) BY MOUTH TWICE DAILY 60 tablet 3   clopidogrel  (PLAVIX ) 75 MG tablet TAKE 1 TABLET(75 MG) BY MOUTH DAILY 90 tablet 3   Continuous Glucose Sensor (FREESTYLE LIBRE 3 PLUS SENSOR) MISC Change sensor every 15 days. 6 each 3   empagliflozin  (JARDIANCE ) 25 MG TABS tablet Take 1 tablet (25 mg total) by mouth daily. 90 tablet 3   ezetimibe  (ZETIA ) 10 MG tablet TAKE 1 TABLET(10 MG) BY MOUTH DAILY 90 tablet 2   glucose blood (FREESTYLE LITE) test strip Use as instructed 100 each 12   glucose blood (FREESTYLE LITE) test strip Use 5 times daily to check blood sugar. 450 each 3   HUMALOG  100 UNIT/ML injection Max daily 110 units per CeQur 110 mL 3   injection device for insulin  (CEQUR SIMPLICITY 2U) DEVI change every 2 days 45 each 3   Injection Device for Insulin  (CEQUR SIMPLICITY INSERTER) MISC Use to Apply a new cecur simplicity patch as directed every 2 days 1 each 1   insulin  glargine, 2 Unit Dial, (TOUJEO  MAX SOLOSTAR) 300 UNIT/ML Solostar Pen Inject 45 Units into the skin in the morning. 15 mL 4   Insulin  Pen Needle (BD PEN NEEDLE NANO U/F) 32G X 4  MM MISC 1 Device by Other route daily in the afternoon. USE AS DIRECTED FOUR TIMES DAILY. Dx code  E11.42 100 each 3   Insulin  Syringe-Needle U-100 (GNP INSULIN  SYRINGES) 30G X 5/16 1 ML MISC 1 Device by Does not apply route daily in the afternoon. 100 each 3   Lancet Devices (BAYER MICROLET 2 LANCING DEVIC) MISC Use to check blood sugar up to 3 ties a day 1 each 2   Lancets (FREESTYLE) lancets Use 5 times daily to check blood sugar. 450 each 3   linezolid  (ZYVOX ) 600 MG tablet Take 1 tablet (600 mg total) by mouth every 12 (twelve) hours for 6 days. 12 tablet 0   lipase/protease/amylase (CREON ) 36000 UNITS CPEP capsule TAKE 1 CAPSULE BY MOUTH THREE TIMES DAILY BEFORE A MEAL (Patient taking differently: Take 36,000 Units by mouth 2 (two) times daily with a meal.) 200 capsule 0   [Paused] lisinopril  (ZESTRIL ) 40 MG tablet TAKE 1 TABLET BY  MOUTH DAILY. HOLD IF SYSTOLIC BLOOD PRESSURE IS LESS THAN 110 (Patient not taking: Reported on 11/26/2023) 90 tablet 3   omeprazole  (PRILOSEC) 40 MG capsule TAKE 1 CAPSULE(40 MG) BY MOUTH DAILY 90 capsule 1   pioglitazone  (ACTOS ) 30 MG tablet TAKE 1 TABLET(30 MG) BY MOUTH DAILY 90 tablet 3   tirzepatide  (MOUNJARO ) 7.5 MG/0.5ML Pen Inject 7.5 mg into the skin once a week. 6 mL 3   No current facility-administered medications on file prior to visit.    Family History  Problem Relation Age of Onset   Diabetes Mother    Coronary artery disease Mother    Coronary artery disease Father    Liver disease Sister    Cancer Brother        colon caner stage 4 as of 11/2011 (unknown age of onset)   Prostate cancer Brother    Colon cancer Brother    Rectal cancer Neg Hx    Esophageal cancer Neg Hx     Social History   Socioeconomic History   Marital status: Single    Spouse name: Not on file   Number of children: 1   Years of education: 12   Highest education level: Not on file  Occupational History   Occupation: retired  Tobacco Use   Smoking status: Former    Current packs/day: 0.00    Types: Cigarettes    Quit date: 1967    Years since quitting: 58.5    Passive exposure: Never   Smokeless tobacco: Former    Types: Chew    Quit date: 1980  Vaping Use   Vaping status: Never Used  Substance and Sexual Activity   Alcohol use: No    Alcohol/week: 0.0 standard drinks of alcohol   Drug use: No   Sexual activity: Yes    Partners: Male    Comment: declined condoms  Other Topics Concern   Not on file  Social History Narrative   Not on file   Social Drivers of Health   Financial Resource Strain: Low Risk  (04/04/2022)   Overall Financial Resource Strain (CARDIA)    Difficulty of Paying Living Expenses: Not hard at all  Food Insecurity: No Food Insecurity (11/23/2023)   Hunger Vital Sign    Worried About Running Out of Food in the Last Year: Never true    Ran Out of Food in the  Last Year: Never true  Transportation Needs: No Transportation Needs (11/23/2023)   PRAPARE - Administrator, Civil Service (Medical): No  Lack of Transportation (Non-Medical): No  Physical Activity: Insufficiently Active (04/04/2022)   Exercise Vital Sign    Days of Exercise per Week: 1 day    Minutes of Exercise per Session: 20 min  Stress: No Stress Concern Present (04/04/2022)   [employer] of Occupational Health - Occupational Stress Questionnaire    Feeling of Stress : Not at all  Social Connections: Socially Isolated (11/23/2023)   Social Connection and Isolation Panel    Frequency of Communication with Friends and Family: Never    Frequency of Social Gatherings with Friends and Family: Once a week    Attends Religious Services: Never    Database administrator or Organizations: No    Attends Banker Meetings: Never    Marital Status: Divorced  Catering manager Violence: Not At Risk (11/23/2023)   Humiliation, Afraid, Rape, and Kick questionnaire    Fear of Current or Ex-Partner: No    Emotionally Abused: No    Physically Abused: No    Sexually Abused: No    Review of Systems: ROS negative except for what is noted on the assessment and plan.  Vitals:   11/27/23 0845  BP: (!) 159/63  Pulse: 72  Temp: 97.6 F (36.4 C)  TempSrc: Oral  SpO2: 97%  Weight: 186 lb (84.4 kg)  Height: 6' 1 (1.854 m)    Physical Exam: Constitutional: well-appearing male  in no acute distress Cardiovascular: regular rate and rhythm, no m/r/g Pulmonary/Chest: normal work of breathing on room air, lungs clear to auscultation bilaterally Skin: warm and dry, purulent open wound on right upper extremity, abscess on tricep area on right upper extremity, and eschar on left lower extremity          Assessment & Plan:   Cellulitis of right elbow Pt presents for hospital follow up for his right elbow cellulitis. He was recently discharged on 7/19 for this,  and he was sent home with Linezolid . He states he has not been feeling any better, and is still having significant pain in that right upper extremity. He has been taking his linezolid  as prescribed, and his wound cultures from the hospital show MRSA, thankfully blood cultures have been negative so far.   On my exam, there is still significant purulence on his right upper extremity wound from where the incision and drainage took place. Questionable if there is a new abscess forming underneath all of that. Thankfully, his systemic symptoms have resolved. He also has a new abscess on his tricep area from where his CGM was placed that he had removed.   Overall, unclear why he has been having these recurrent abscesses. His last CD4 count was within normal limits, and HIV RNA count was low. He does not use IV drugs per pt reports, and he isn't bacteremic from blood cultures obtained during his previous hospitalization. Please see photos under physical exam section as well.   I think he would benefit from a few more days of IV antibiotics given the severity of his wound, as well as the new abscess formation, so will be directly admitting him.   Plan:  - Directly admit  - Consider repeat imaging, ortho consult, and IV antibiotics   Eschar of lower leg First present about three weeks ago and developed along with his other symptoms (right upper extremity cellulitis). Seems to be growing and now has erythema around it as well. It's not tender, and doesn't seem to bother him.  He does have a significant history  of PAD, and his last ABI's on his left leg showed on 12/2022 showed moderate arterial disease. He is due for repeat ABI's on August 4th, but wonder if this is an ischemic eschar. Would recommend repeating ABI's while hospitalized.    Patient discussed with Dr. Winfrey  Yomira Flitton, M.D. [facility] Internal Medicine, PGY-3 Pager: [phone] Date 11/27/2023 Time 11:16 AM

## 2023-11-27 NOTE — Progress Notes (Signed)
 Internal Medicine Clinic Attending  I was physically present during the key portions of the resident provided service and participated in the medical decision making of patient's management care. I reviewed pertinent patient test results.  The assessment, diagnosis, and plan were formulated together and I agree with the documentation in the resident's note. Discussed with patient, prior inpatient team and attending.  Worsening of pain and appearance of wound on right forearm with purulent drainage non-healing, new abscess on posterior upper arm, worsening eschar and appearance of lower extremity eschar with surrounding erythema.  Will readmit for further management as outlined below.    Francesco Elsie NOVAK, MD

## 2023-11-27 NOTE — Patient Instructions (Signed)
 Thank you so much for coming to the clinic today!   We are sending you back to the hospital today, without going through the emergency department.   If you have any questions please feel free to the call the clinic at anytime at [phone]. It was a pleasure seeing you!  Best, Dr. Chizuko Trine

## 2023-11-28 ENCOUNTER — Other Ambulatory Visit (HOSPITAL_COMMUNITY): Payer: Self-pay

## 2023-11-28 ENCOUNTER — Observation Stay (HOSPITAL_BASED_OUTPATIENT_CLINIC_OR_DEPARTMENT_OTHER): Payer: Medicare (Managed Care)

## 2023-11-28 DIAGNOSIS — I7090 Unspecified atherosclerosis: Secondary | ICD-10-CM | POA: Diagnosis not present

## 2023-11-28 DIAGNOSIS — L03113 Cellulitis of right upper limb: Secondary | ICD-10-CM | POA: Diagnosis not present

## 2023-11-28 LAB — CBC WITH DIFFERENTIAL/PLATELET
Abs Immature Granulocytes: 0.09 K/uL — ABNORMAL HIGH (ref 0.00–0.07)
Basophils Absolute: 0.1 K/uL (ref 0.0–0.1)
Basophils Relative: 1 %
Eosinophils Absolute: 0.3 K/uL (ref 0.0–0.5)
Eosinophils Relative: 5 %
HCT: 40.4 % (ref 39.0–52.0)
Hemoglobin: 13.2 g/dL (ref 13.0–17.0)
Immature Granulocytes: 2 %
Lymphocytes Relative: 49 %
Lymphs Abs: 2.8 K/uL (ref 0.7–4.0)
MCH: 29.9 pg (ref 26.0–34.0)
MCHC: 32.7 g/dL (ref 30.0–36.0)
MCV: 91.6 fL (ref 80.0–100.0)
Monocytes Absolute: 0.4 K/uL (ref 0.1–1.0)
Monocytes Relative: 7 %
Neutro Abs: 2 K/uL (ref 1.7–7.7)
Neutrophils Relative %: 36 %
Platelets: 262 K/uL (ref 150–400)
RBC: 4.41 MIL/uL (ref 4.22–5.81)
RDW: 13.9 % (ref 11.5–15.5)
WBC: 5.6 K/uL (ref 4.0–10.5)
nRBC: 0 % (ref 0.0–0.2)

## 2023-11-28 LAB — BASIC METABOLIC PANEL WITH GFR
Anion gap: 10 (ref 5–15)
BUN: 11 mg/dL (ref 8–23)
CO2: 24 mmol/L (ref 22–32)
Calcium: 9.2 mg/dL (ref 8.9–10.3)
Chloride: 106 mmol/L (ref 98–111)
Creatinine, Ser: 0.99 mg/dL (ref 0.61–1.24)
GFR, Estimated: >60 mL/min (ref >60)
Glucose, Bld: 191 mg/dL — ABNORMAL HIGH (ref 70–99)
Potassium: 3.7 mmol/L (ref 3.5–5.1)
Sodium: 140 mmol/L (ref 135–145)

## 2023-11-28 LAB — GLUCOSE, CAPILLARY
Glucose-Capillary: 192 mg/dL — ABNORMAL HIGH (ref 70–99)
Glucose-Capillary: 185 mg/dL — ABNORMAL HIGH (ref 70–99)

## 2023-11-28 LAB — CULTURE, BLOOD (ROUTINE X 2)
Culture: NO GROWTH
Culture: NO GROWTH
Special Requests: ADEQUATE
Special Requests: ADEQUATE

## 2023-11-28 MED ORDER — LINEZOLID 600 MG PO TABS
600.0000 mg | ORAL_TABLET | Freq: Two times a day (BID) | ORAL | 0 refills | Status: DC
  Filled 2023-11-28: qty 12, 6d supply, fill #0

## 2023-11-28 MED ORDER — BISACODYL 10 MG RE SUPP
10.0000 mg | Freq: Once | RECTAL | Status: DC
  Filled 2023-11-28: qty 1

## 2023-11-28 MED ORDER — ORAL CARE MOUTH RINSE: 15.0000 mL | OROMUCOSAL | Status: DC | PRN

## 2023-11-28 MED ORDER — IBUPROFEN 400 MG PO TABS
400.0000 mg | ORAL_TABLET | Freq: Three times a day (TID) | ORAL | 0 refills | Status: AC | PRN
  Filled 2023-11-28: qty 15, 5d supply, fill #0

## 2023-11-28 MED ORDER — ACETAMINOPHEN 500 MG PO TABS
1000.0000 mg | ORAL_TABLET | Freq: Four times a day (QID) | ORAL | Status: DC
  Administered 2023-11-28: 1000 mg via ORAL
  Filled 2023-11-28: qty 2

## 2023-11-28 NOTE — Discharge Instructions (Signed)
 Thank you for allowing us  to be part of your care. You were hospitalized for a Right arm wound. We treated you with linezolid  and ibuprofen   See the changes in your medications and management of your chronic conditions below:  *For your R elbow wound -We have Continued you on these following medications:  -Linezolid  600mg --take 1 tablet ever 12 hours through 7/29.  -Ibuprofen  800mg --take up to every 8 hours as needed for pain for 5 days   -Alternate ibuprofen  with 1000mg  tylenol  every 4 hours.   Wound cleaning instructions: As the orthopedic team instructed, you should wash the wound with soap and water twice per day. No coverage necessary. Keep clean and dry.  -Please see your PCP in 7 to 10 days  FOLLOW UP APPOINTMENTS: We arranged for you to follow up with your family doctor at: our clinic on 7/29 1000   PLEASE CALL: [facility] in [place] to make an appointment with Dr. Elsa in the next week. Their number is: [phone]    Please make sure to keep your wound clean and dry and to take your antibiotics as prescribed.   Please call your PCP or our clinic if you have any questions or concerns, we may be able to help and keep you from a long and expensive emergency room wait. Our clinic and after hours phone number is [phone]. The best time to call is Monday through Friday 9 am to 4 pm but there is always someone available 24/7 if you have an emergency. If you need medication refills please notify your pharmacy one week in advance and they will send us  a request.   We are glad you are feeling better,  Schuyler Novak, DO Internal Medicine Inpatient Teaching Service at [facility]

## 2023-11-28 NOTE — Discharge Summary (Cosign Needed)
 Name: Nathan [name] MRN: [id number] DOB: 02-07-1951 73 y.o. PCP: Nooruddin, Saad, MD  Date of Admission: 11/27/2023 10:58 AM Date of Discharge: 11/28/2023 Attending Physician: Dr. Dayton Eastern  Discharge Diagnosis: 1. Principal Problem:   Right arm cellulitis Active Problems:   HIV disease (HCC)   PAD (peripheral artery disease) (HCC)   GERD   Type 2 DM with diabetic peripheral angiopathy w/o gangrene [facility])   Pancreatic insufficiency    Discharge Medications: Allergies as of 11/28/2023       Reactions   Sulfonamide Derivatives Hives        Medication List     TAKE these medications    acetaminophen  500 MG tablet Commonly known as: TYLENOL  Take 2 tablets (1,000 mg total) by mouth every 6 (six) hours.   atorvastatin  40 MG tablet Commonly known as: LIPITOR TAKE 1 TABLET(40 MG) BY MOUTH DAILY   Bayer Microlet 2 Lancing Devic Misc Use to check blood sugar up to 3 ties a day   BD Pen Needle Nano U/F 32G X 4 MM Misc Generic drug: Insulin  Pen Needle 1 Device by Other route daily in the afternoon. USE AS DIRECTED FOUR TIMES DAILY. Dx code  E11.42   Biktarvy  50-200-25 MG Tabs tablet Generic drug: bictegravir-emtricitabine -tenofovir  AF Take 1 tablet by mouth daily.   Artist Use to Apply a new cecur simplicity patch as directed every 2 days   CeQur Simplicity 2U Devi Generic drug: injection device for insulin  change every 2 days   cilostazol  50 MG tablet Commonly known as: PLETAL  TAKE 1 TABLET(50 MG) BY MOUTH TWICE DAILY   clopidogrel  75 MG tablet Commonly known as: PLAVIX  TAKE 1 TABLET(75 MG) BY MOUTH DAILY   Creon  36000-114000 units Cpep capsule Generic drug: lipase/protease/amylase TAKE 1 CAPSULE BY MOUTH THREE TIMES DAILY BEFORE A MEAL What changed: See the new instructions.   empagliflozin  25 MG Tabs tablet Commonly known as: Jardiance  Take 1 tablet (25 mg total) by mouth daily.   ezetimibe  10 MG tablet Commonly known as:  ZETIA  TAKE 1 TABLET(10 MG) BY MOUTH DAILY   freestyle lancets Use 5 times daily to check blood sugar.   FreeStyle Libre 3 Plus Sensor Misc Change sensor every 15 days.   FREESTYLE LITE test strip Generic drug: glucose blood Use as instructed   FREESTYLE LITE test strip Generic drug: glucose blood Use 5 times daily to check blood sugar.   FreeStyle Lite w/Device Kit 1 Device by Does not apply route daily in the afternoon.   HumaLOG  100 UNIT/ML injection Generic drug: insulin  lispro Max daily 110 units per CeQur   ibuprofen  400 MG tablet Commonly known as: ADVIL  Take 1 tablet (400 mg total) by mouth every 8 (eight) hours as needed for up to 5 days for moderate pain (pain score 4-6).   Insulin  Syringe-Needle U-100 30G X 5/16 1 ML Misc Commonly known as: GNP Insulin  Syringes 1 Device by Does not apply route daily in the afternoon.   linezolid  600 MG tablet Commonly known as: ZYVOX  Take 1 tablet (600 mg total) by mouth every 12 (twelve) hours for 6 days.   lisinopril  40 MG tablet Commonly known as: ZESTRIL  TAKE 1 TABLET BY MOUTH DAILY. HOLD IF SYSTOLIC BLOOD PRESSURE IS LESS THAN 110   omeprazole  40 MG capsule Commonly known as: PRILOSEC TAKE 1 CAPSULE(40 MG) BY MOUTH DAILY   pioglitazone  30 MG tablet Commonly known as: ACTOS  TAKE 1 TABLET(30 MG) BY MOUTH DAILY   tirzepatide  7.5 MG/0.5ML  Pen Commonly known as: MOUNJARO  Inject 7.5 mg into the skin once a week.   Toujeo  Max SoloStar 300 UNIT/ML Solostar Pen Generic drug: insulin  glargine (2 Unit Dial) Inject 45 Units into the skin in the morning.               Discharge Care Instructions  (From admission, onward)           Start     Ordered   11/28/23 0000  Discharge wound care:       Comments: Wound cleaning instructions: As the orthopedic team instructed, you should wash the wound with soap and water twice per day. No coverage necessary. Keep clean and dry.   11/28/23 1458             Disposition and follow-up:   Mr.Fletcher D Dinius was discharged from [facility] in Good condition.  At the hospital follow up visit please address:  Wound--ensure pt is keeping would clean and dry and taking abx as prescribed.  RFP--pt with hx of AKI on previous hospitalization and is currently on many renally acting medications.  T2DM--ensure pt maintaining adequate glycemic control  2.  Labs / imaging needed at time of follow-up: RFP--pt on ibuprofen , biktarvy , and lisinopril    Follow-up Appointments:  [facility] 12/04/2023 at 10:00am  Hospital Course by problem list: Nathan [name] is a 73 y.o. person living with a history of HIV, PVD, HLD, T2DM, GERD, pancreatic insufficiency who presented with RUE would and admitted for RUE cellulitis  now being discharged on hospital day 0 with the following pertinent hospital course:  RUE Cellulitis The patient was recently discharged from [facility] on 7/17 after being treated with Vancomycin  and transitioned to linezolid  at discharge. He was seen in the clinic on 7/22 with concern for worsening wound infection. The wound was purulent with erythematous edges. His wound cultures were positive for MRSA and he was taking the linezolid  as prescribed. He was directly admitted to the hospital. Upon admission, an MRI was obtained which showed the cavity of the previously drained abscess and no new formation. He remained afebrile, without leukocytosis and normotensive. There was little concern for systemic infection. Ortho was consulted and advised outpatient follow up with no further drainage needed to be done inpatient. They also advised the wound to be washed with soap and water twice a day. On 7/23 he was deemed safe for discharge. He was discharged with linezolid  to finish a 10 day course and instructed to take tylenol  and ibuprofen  for pain.      Stable chronic medical conditions: HIV GERD HTN PVD  Subjective Pt seen and examined at  the bedside this morning. He is feeling well overall and reports that he is in mild pain. Discussed MRI results and potential discharge today. He is agreeable. All questions and concerns were addressed at this time.   Discharge Exam:   BP (!) 143/89 (BP Location: Left Arm)   Pulse 73   Temp 98.3 F (36.8 C) (Oral)   Resp 18   Ht 6' 1 (1.854 m)   Wt 83.9 kg   SpO2 93%   BMI 24.39 kg/m  Discharge exam:  Const: Awake, alert in NAD HENT: Normocephalic, atraumatic, mucus membranes moist Card: RRR, No MRG, No pitting edema on LE's bilaterally  Resp: LCTAB, no increased work of breathing Abd: Soft, NTND, Bsx4 Extremities: R AKA. LLE with chronic appearing lesions noted on left calf, well healing without purulence. RUE would noted on proximal  forearm with erythematous bordered. Erythema well reduced from previous markings.     Pertinent Labs, Studies, and Procedures:     Latest Ref Rng & Units 11/28/2023    4:11 AM 11/27/2023    1:10 PM 11/24/2023    4:54 AM  CBC  WBC 4.0 - 10.5 K/uL 5.6  6.0  6.7   Hemoglobin 13.0 - 17.0 g/dL 86.7  87.1  87.6   Hematocrit 39.0 - 52.0 % 40.4  38.9  37.2   Platelets 150 - 400 K/uL 262  239  272        Latest Ref Rng & Units 11/28/2023    4:11 AM 11/27/2023    1:10 PM 11/24/2023    4:54 AM  CMP  Glucose 70 - 99 mg/dL 808  834  710   BUN 8 - 23 mg/dL 11  9  31    Creatinine 0.61 - 1.24 mg/dL 9.00  9.09  8.50   Sodium 135 - 145 mmol/L 140  141  136   Potassium 3.5 - 5.1 mmol/L 3.7  4.0  3.6   Chloride 98 - 111 mmol/L 106  106  101   CO2 22 - 32 mmol/L 24  24  19    Calcium  8.9 - 10.3 mg/dL 9.2  9.1  9.4     VAS US  ABI WITH/WO TBI Result Date: 11/28/2023  LOWER EXTREMITY DOPPLER STUDY Patient Name:  KENDRICKS REAP  Date of Exam:   11/28/2023 Medical Rec #: [id number]        Accession #:    [id number] Date of Birth: June 01, 1950         Patient Gender: M Patient Age:   24 years Exam Location:  [facility] Procedure:      VAS US  ABI WITH/WO TBI  Referring Phys: ERIK HOFFMAN --------------------------------------------------------------------------------  Indications: Peripheral artery disease. right AKA High Risk         Hypertension, hyperlipidemia, Diabetes, past history of Factors:          smoking.  Vascular Interventions: Multiple LLE interventions. Comparison Study: 01/22/2023 Left- 0.76                   2009-Rt AKA Performing Technologist: Jimmye Scarce RVT  Examination Guidelines: A complete evaluation includes at minimum, Doppler waveform signals and systolic blood pressure reading at the level of bilateral brachial, anterior tibial, and posterior tibial arteries, when vessel segments are accessible. Bilateral testing is considered an integral part of a complete examination. Photoelectric Plethysmograph (PPG) waveforms and toe systolic pressure readings are included as required and additional duplex testing as needed. Limited examinations for reoccurring indications may be performed as noted.  ABI Findings: +--------+------------------+-----+--------+----------------------+ Right   Rt Pressure (mmHg)IndexWaveformComment                +--------+------------------+-----+--------+----------------------+ Brachial                               bandage from procedure +--------+------------------+-----+--------+----------------------+ PTA                                    AKA                    +--------+------------------+-----+--------+----------------------+ +---------+------------------+-----+----------+-------+ Left     Lt Pressure (mmHg)IndexWaveform  Comment +---------+------------------+-----+----------+-------+ Brachial 160  triphasic         +---------+------------------+-----+----------+-------+ PTA      107               0.67 monophasic        +---------+------------------+-----+----------+-------+ DP       118               0.74 monophasic         +---------+------------------+-----+----------+-------+ Great Toe46                0.29 Abnormal          +---------+------------------+-----+----------+-------+ +-------+-----------+-----------+------------+------------+ ABI/TBIToday's ABIToday's TBIPrevious ABIPrevious TBI +-------+-----------+-----------+------------+------------+ Right  AKA                                            +-------+-----------+-----------+------------+------------+ Left   0.74                                           +-------+-----------+-----------+------------+------------+  There is no significant change when compared to the prior exam.  Summary: Right: Right AKA. Left: Resting left ankle-brachial index indicates moderate left lower extremity arterial disease. The left toe-brachial index is abnormal. *See table(s) above for measurements and observations.     Preliminary    MR FOREARM RIGHT W WO CONTRAST Result Date: 11/28/2023 CLINICAL DATA:  Nonhealing wound along the forearm EXAM: MRI OF THE RIGHT FOREARM WITHOUT AND WITH CONTRAST TECHNIQUE: Multiplanar, multisequence MR imaging of the right forearm was performed before and after the administration of intravenous contrast. CONTRAST:  8mL GADAVIST  GADOBUTROL  1 MMOL/ML IV SOLN COMPARISON:  CT right elbow 11/23/2023 FINDINGS: Field heterogeneity observed with bands of signal loss medially along the mid forearm and along the elbow. Bones/Joint/Cartilage No marrow edema or significant abnormality of the radius or ulna identified. Please note that the elbow region is obscured by boundary artifact. In particular the olecranon and proximal ulna are not characterized or included. Ligaments N/A Muscles and Tendons Subtle edema and enhancement in the superficial portion of the extensor carpi ulnaris muscle proximally, just deep to the subcutaneous collapsed cavity. This may be reactive or could reflect low-grade myositis. Soft tissues Accentuated subcutaneous  signal on STIR images and T2 fat saturated images is presumably due to mild diffuse subcutaneous edema along the forearm. This could reflect cellulitis. On image 40 series 15, we demonstrate a dorsal proximal forearm cutaneous wound draining a flattened and collapsed 3.6 by 0.3 by 3.2 cm cavity, likely a drain subcutaneous abscess. IMPRESSION: 1. Dorsal proximal forearm cutaneous wound draining a flattened and collapsed 3.6 by 0.3 by 3.2 cm cavity, likely a drain subcutaneous abscess. 2. Subtle edema and enhancement in the superficial portion of the extensor carpi ulnaris muscle proximally, just deep to the subcutaneous collapsed cavity. This may be reactive or could reflect low-grade myositis. 3. Mild diffuse subcutaneous edema along the forearm, possibly cellulitis. 4. No marrow edema or significant abnormality of the radius or ulna identified. Please note that the elbow region is obscured by boundary artifact. In particular the olecranon and proximal ulna are not characterized or included. Electronically Signed   By: Ryan Salvage M.D.   On: 11/28/2023 08:31     Discharge Instructions: Discharge Instructions     Call MD for:  redness, tenderness, or signs  of infection (pain, swelling, redness, odor or green/yellow discharge around incision site)   Complete by: As directed    Call MD for:  severe uncontrolled pain   Complete by: As directed    Call MD for:  temperature >100.4   Complete by: As directed    Diet - low sodium heart healthy   Complete by: As directed    Discharge instructions   Complete by: As directed    Thank you for allowing us  to be part of your care. You were hospitalized for a Right arm wound. We treated you with linezolid  and ibuprofen   See the changes in your medications and management of your chronic conditions below:  *For your R elbow wound -We have Continued you on these following medications:  -Linezolid  600mg --take 1 tablet ever 12 hours through 7/29.  Wound  cleaning instructions: As the orthopedic team instructed, you should wash the wound with soap and water twice per day. No coverage necessary. Keep clean and dry.  -Please see your PCP in 7 to 10 days  FOLLOW UP APPOINTMENTS: We arranged for you to follow up with your family doctor at: our clinic on 7/29 1000   PLEASE CALL: [facility] in [place] to make an appointment with Dr. Elsa in the next week. Their number is: [phone]    Please make sure to keep your wound clean and dry and to take your antibiotics as prescribed.   Please call your PCP or our clinic if you have any questions or concerns, we may be able to help and keep you from a long and expensive emergency room wait. Our clinic and after hours phone number is [phone]. The best time to call is Monday through Friday 9 am to 4 pm but there is always someone available 24/7 if you have an emergency. If you need medication refills please notify your pharmacy one week in advance and they will send us  a request.   We are glad you are feeling better,  Nathan Novak, DO Internal Medicine Inpatient Teaching Service at [facility]   Discharge wound care:   Complete by: As directed    Wound cleaning instructions: As the orthopedic team instructed, you should wash the wound with soap and water twice per day. No coverage necessary. Keep clean and dry.   Increase activity slowly   Complete by: As directed        Signed: Novak Schuyler, DO 11/28/2023, 5:05 PM

## 2023-11-28 NOTE — TOC Transition Note (Signed)
 Transition of Care [facility]) - Discharge Note   Patient Details  Name: Nathan [name] MRN: [id number] Date of Birth: Nov 06, 1950  Transition of Care [facility] CM/SW Contact:  Waddell Barnie Rama, RN Phone Number: 11/28/2023, 3:44 PM   Clinical Narrative:    For dc today, has no needs.         Patient Goals and CMS Choice            Discharge Placement                       Discharge Plan and Services Additional resources added to the After Visit Summary for                                       Social Drivers of Health (SDOH) Interventions SDOH Screenings   Food Insecurity: No Food Insecurity (11/27/2023)  Housing: Low Risk  (11/27/2023)  Transportation Needs: No Transportation Needs (11/27/2023)  Utilities: Not At Risk (11/27/2023)  Alcohol Screen: Low Risk  (04/04/2022)  Depression (PHQ2-9): Low Risk  (11/22/2023)  Financial Resource Strain: Low Risk  (04/04/2022)  Physical Activity: Insufficiently Active (04/04/2022)  Social Connections: Socially Isolated (11/27/2023)  Stress: No Stress Concern Present (04/04/2022)  Tobacco Use: Medium Risk (11/27/2023)     Readmission Risk Interventions     No data to display

## 2023-11-28 NOTE — Progress Notes (Signed)
 DISCHARGE NOTE HOME Nathan [name] to be discharged Home per MD order. Discussed prescriptions and follow up appointments with the patient. Prescriptions given to patient; medication list explained in detail. Patient verbalized understanding.  Skin clean, dry and intact without evidence of skin break down, no evidence of skin tears noted. IV catheter discontinued intact. Site without signs and symptoms of complications. Dressing and pressure applied. Pt denies pain at the site currently. No complaints noted.  Patient free of lines, drains, and wounds.   An After Visit Summary (AVS) was printed and given to the patient. Patient escorted via wheelchair, and discharged home via private auto.  [name] A Proctor-Gann, RN

## 2023-11-28 NOTE — Progress Notes (Signed)
 ABI exam is completed. Kylie Simmonds, RVT

## 2023-11-28 NOTE — Hospital Course (Addendum)
 RUE Cellulitis The patient was recently discharged from [facility] on 7/17 after being treated with Vancomycin  and transitioned to linezolid  at discharge. He was seen in the clinic on 7/22 with concern for worsening wound infection. The wound was purulent with erythematous edges. His wound cultures were positive for MRSA and he was taking the linezolid  as prescribed. He was directly admitted to the hospital. Upon admission, an MRI was obtained which showed the cavity of the previously drained abscess and no new formation. He remained afebrile, without leukocytosis and normotensive. There was little concern for systemic infection. Ortho was consulted and advised outpatient follow up with no further drainage needed to be done. On 7/23 he was deemed safe for discharge. He was discharged with linezolid  to finish a 10 day course and instructed to take tylenol  and ibuprofen  for pain.

## 2023-11-28 NOTE — TOC CM/SW Note (Signed)
 Transition of Care [facility]) - Inpatient Brief Assessment   Patient Details  Name: Nathan [name] MRN: [id number] Date of Birth: [birth year]  Transition of [facility] CM/SW Contact:    Waddell Barnie Rama, RN Phone Number: 11/28/2023, 11:09 AM   Clinical Narrative: From home alone, has right prosthetic LE, has PCP and insurance on file, states has no HH services in place at this time or DME at home.  States family member will transport them home at [employer] and family is support system, states gets medications from [facility] [address].  Pta self ambulatory.   There are no IP CM needs identified  at this time.  Please place consult for IP CM needs.     Transition of Care Asessment: Insurance and Status: Insurance coverage has been reviewed Patient has primary care physician: Yes Home environment has been reviewed: home alone Prior level of function:: ambulatory , has right prosthetic Prior/Current Home Services: No current home services Social Drivers of Health Review: SDOH reviewed no interventions necessary Readmission risk has been reviewed: Yes Transition of care needs: no transition of care needs at this time

## 2023-11-29 ENCOUNTER — Telehealth: Payer: Self-pay

## 2023-11-29 LAB — VAS US ABI WITH/WO TBI: Left ABI: 0.74

## 2023-11-29 NOTE — Transitions of Care (Post Inpatient/ED Visit) (Unsigned)
   11/29/2023  Name: Nathan [name] MRN: [id number] DOB: Oct 23, 1950  Today's TOC FU Call Status: Today's TOC FU Call Status:: Unsuccessful Call (1st Attempt) Unsuccessful Call (1st Attempt) Date: 11/29/23  Attempted to reach the patient regarding the most recent Inpatient/ED visit.  Follow Up Plan: Additional outreach attempts will be made to reach the patient to complete the Transitions of Care (Post Inpatient/ED visit) call.   Signature Julian Lemmings, LPN [facility] Nurse Health Advisor Direct Dial [phone]

## 2023-11-30 ENCOUNTER — Ambulatory Visit: Payer: Medicare (Managed Care) | Admitting: Internal Medicine

## 2023-11-30 NOTE — Transitions of Care (Post Inpatient/ED Visit) (Signed)
 11/30/2023  Name: Nathan [name] MRN: [id number] DOB: 28-Jan-1951  Today's TOC FU Call Status: Today's TOC FU Call Status:: Successful TOC FU Call Completed Unsuccessful Call (1st Attempt) Date: 11/29/23 [facility] FU Call Complete Date: 11/30/23 Patient's Name and Date of Birth confirmed.  Transition Care Management Follow-up Telephone Call Date of Discharge: 11/28/23 Discharge Facility: [facility]) Type of Discharge: Inpatient Admission Primary Inpatient Discharge Diagnosis:: cellulitis How have you been since you were released from the hospital?: Better Any questions or concerns?: No  Items Reviewed: Did you receive and understand the discharge instructions provided?: Yes Medications obtained,verified, and reconciled?: Yes (Medications Reviewed) Any new allergies since your discharge?: No Dietary orders reviewed?: Yes Do you have support at home?: No  Medications Reviewed Today: Medications Reviewed Today     Reviewed by Emmitt Pan, LPN (Licensed Practical Nurse) on 11/30/23 at 1107  Med List Status: <None>   Medication Order Taking? Sig Documenting Provider Last Dose Status Informant  acetaminophen  (TYLENOL ) 500 MG tablet [id number] Yes Take 2 tablets (1,000 mg total) by mouth every 6 (six) hours. Gomez-Caraballo, Maria, MD  Active   atorvastatin  (LIPITOR) 40 MG tablet [id number] Yes TAKE 1 TABLET(40 MG) BY MOUTH DAILY Shamleffer, Ibtehal Jaralla, MD  Active   bictegravir-emtricitabine -tenofovir  AF (BIKTARVY ) 50-200-25 MG TABS tablet [id number] Yes Take 1 tablet by mouth daily. Fleeta Rothman, Jomarie SAILOR, MD  Active Self, Pharmacy Records  Blood Glucose Monitoring Suppl (FREESTYLE LITE) w/Device KIT [id number] Yes 1 Device by Does not apply route daily in the afternoon. Shamleffer, Donell Cardinal, MD  Active Self, Pharmacy Records  cilostazol  (PLETAL ) 50 MG tablet [id number] Yes TAKE 1 TABLET(50 MG) BY MOUTH TWICE DAILY Nooruddin, Saad, MD  Active Self, Pharmacy Records  clopidogrel   (PLAVIX ) 75 MG tablet [id number] Yes TAKE 1 TABLET(75 MG) BY MOUTH DAILY Nooruddin, Saad, MD  Active Self, Pharmacy Records  Continuous Glucose Sensor (FREESTYLE LIBRE 3 PLUS SENSOR) [place] [id number] Yes Change sensor every 15 days. Shamleffer, Donell Cardinal, MD  Active Self, Pharmacy Records  empagliflozin  (JARDIANCE ) 25 MG TABS tablet [id number] Yes Take 1 tablet (25 mg total) by mouth daily. Shamleffer, Donell Cardinal, MD  Active Self, Pharmacy Records  ezetimibe  (ZETIA ) 10 MG tablet [id number] Yes TAKE 1 TABLET(10 MG) BY MOUTH DAILY Nooruddin, Saad, MD  Active Self, Pharmacy Records  glucose blood (FREESTYLE LITE) test strip [id number] Yes Use as instructed Nooruddin, Saad, MD  Active Self, Pharmacy Records  glucose blood (FREESTYLE LITE) test strip [id number] Yes Use 5 times daily to check blood sugar. Gregary Sharper, MD  Active Self, Pharmacy Records  HUMALOG  100 UNIT/ML injection [id number] Yes Max daily 110 units per CeQur Shamleffer, Donell Cardinal, MD  Active Self, Pharmacy Records  ibuprofen  (ADVIL ) 400 MG tablet [id number] Yes Take 1 tablet (400 mg total) by mouth every 8 (eight) hours as needed for up to 5 days for moderate pain (pain score 4-6). Gomez-Caraballo, Maria, MD  Active   injection device for insulin  [facility] SIMPLICITY 2U) DEVI [id number] Yes change every 2 days Shamleffer, Ibtehal Jaralla, MD  Active Self, Pharmacy Records  Injection Device for Insulin  [facility] SIMPLICITY INSERTER) MISC [id number] Yes Use to Apply a new cecur simplicity patch as directed every 2 days Rudy Sieving, MD  Active Self, Pharmacy Records  insulin  glargine, 2 Unit Dial, (TOUJEO  MAX SOLOSTAR) 300 UNIT/ML Solostar Pen [id number] Yes Inject 45 Units into the skin in the morning. Shamleffer, Donell Cardinal, MD  Active Self, Pharmacy Records  Insulin  Pen Needle (BD PEN NEEDLE NANO U/F)  32G X 4 MM MISC [id number] Yes 1 Device by Other route daily in the afternoon. USE AS DIRECTED FOUR TIMES DAILY. Dx code  Z88.57  Shamleffer, Ibtehal Jaralla, MD  Active Self, Pharmacy Records  Insulin  Syringe-Needle U-100 (GNP INSULIN  SYRINGES) 30G X 5/16 1 ML MISC [id number] Yes 1 Device by Does not apply route daily in the afternoon. Shamleffer, Donell Cardinal, MD  Active Self, Pharmacy Records  Lancet Devices (BAYER MICROLET 2 LANCING [facility]) MISC [id number] Yes Use to check blood sugar up to 3 ties a day Prentiss Prentice SAILOR, DO  Active Self, Pharmacy Records  Lancets (FREESTYLE) lancets [id number] Yes Use 5 times daily to check blood sugar. Gawaluck, Greylon, MD  Active Self, Pharmacy Records  linezolid  (ZYVOX ) 600 MG tablet [id number] Yes Take 1 tablet (600 mg total) by mouth every 12 (twelve) hours for 6 days. Elnora Ip, MD  Active   lipase/protease/amylase (CREON ) 36000 UNITS CPEP capsule [id number] Yes TAKE 1 CAPSULE BY MOUTH THREE TIMES DAILY BEFORE A MEAL  Patient taking differently: Take 36,000 Units by mouth 2 (two) times daily with a meal.   Nooruddin, Roetta, MD  Active Self, Pharmacy Records  lisinopril  (ZESTRIL ) 40 MG tablet [id number]  TAKE 1 TABLET BY MOUTH DAILY. HOLD IF SYSTOLIC BLOOD PRESSURE IS LESS THAN 110  Patient not taking: Reported on 11/30/2023   Nooruddin, Saad, MD  Active Self, Pharmacy Records  omeprazole  [facility]) 40 MG capsule [id number] Yes TAKE 1 CAPSULE(40 MG) BY MOUTH DAILY Nooruddin, Saad, MD  Active Self, Pharmacy Records  pioglitazone  (ACTOS ) 30 MG tablet [id number] Yes TAKE 1 TABLET(30 MG) BY MOUTH DAILY Shamleffer, Donell Cardinal, MD  Active Self, Pharmacy Records  tirzepatide  (MOUNJARO ) 7.5 MG/0.5ML Pen [id number] Yes Inject 7.5 mg into the skin once a week. Shamleffer, Donell Cardinal, MD  Active Self, Pharmacy Records            Home Care and Equipment/Supplies: Were Home Health Services Ordered?: NA Any new equipment or medical supplies ordered?: NA  Functional Questionnaire: Do you need assistance with bathing/showering or dressing?: No Do you need assistance with  meal preparation?: No Do you need assistance with eating?: No Do you have difficulty maintaining continence: No Do you need assistance with getting out of bed/getting out of a chair/moving?: No Do you have difficulty managing or taking your medications?: No  Follow up appointments reviewed: PCP Follow-up appointment confirmed?: Yes Date of PCP follow-up appointment?: 12/04/23 Follow-up Provider: [facility] Follow-up appointment confirmed?: NA Do you need transportation to your follow-up appointment?: No Do you understand care options if your condition(s) worsen?: Yes-patient verbalized understanding    SIGNATURE Julian Lemmings, LPN [facility] Nurse Health Advisor Direct Dial [phone]

## 2023-12-04 ENCOUNTER — Other Ambulatory Visit: Payer: Self-pay

## 2023-12-04 ENCOUNTER — Other Ambulatory Visit: Payer: Medicare (Managed Care)

## 2023-12-04 ENCOUNTER — Other Ambulatory Visit (HOSPITAL_COMMUNITY)
Admission: RE | Admit: 2023-12-04 | Discharge: 2023-12-04 | Disposition: A | Discharge status: Home / Self Care | Payer: Medicare (Managed Care) | Source: Ambulatory Visit | Attending: Infectious Disease | Admitting: Infectious Disease

## 2023-12-04 ENCOUNTER — Ambulatory Visit: Payer: Medicare (Managed Care) | Admitting: Student

## 2023-12-04 VITALS — BP 129/54 | HR 79 | Temp 97.6°F | Ht 73.0 in | Wt 184.6 lb

## 2023-12-04 DIAGNOSIS — Z79899 Other long term (current) drug therapy: Secondary | ICD-10-CM | POA: Diagnosis not present

## 2023-12-04 DIAGNOSIS — Z794 Long term (current) use of insulin: Secondary | ICD-10-CM | POA: Diagnosis not present

## 2023-12-04 DIAGNOSIS — E1151 Type 2 diabetes mellitus with diabetic peripheral angiopathy without gangrene: Secondary | ICD-10-CM | POA: Diagnosis not present

## 2023-12-04 DIAGNOSIS — B20 Human immunodeficiency virus [HIV] disease: Secondary | ICD-10-CM | POA: Insufficient documentation

## 2023-12-04 DIAGNOSIS — L304 Erythema intertrigo: Secondary | ICD-10-CM | POA: Diagnosis not present

## 2023-12-04 DIAGNOSIS — I10 Essential (primary) hypertension: Secondary | ICD-10-CM | POA: Diagnosis not present

## 2023-12-04 DIAGNOSIS — Z113 Encounter for screening for infections with a predominantly sexual mode of transmission: Secondary | ICD-10-CM

## 2023-12-04 DIAGNOSIS — R234 Changes in skin texture: Secondary | ICD-10-CM | POA: Diagnosis not present

## 2023-12-04 DIAGNOSIS — L03113 Cellulitis of right upper limb: Secondary | ICD-10-CM | POA: Diagnosis not present

## 2023-12-04 MED ORDER — CLOTRIMAZOLE 1 % EX CREA: 1.0000 | TOPICAL_CREAM | Freq: Two times a day (BID) | CUTANEOUS | 0 refills | Status: AC

## 2023-12-04 NOTE — Assessment & Plan Note (Signed)
 In groin and buttocks. Mild but itchy and uncomfortable. Suspect a complication of prolonged antibiotic use. Start clotrimazole . Hold SGLT-2 for now.

## 2023-12-04 NOTE — Assessment & Plan Note (Signed)
 Chronic and stable. Somewhat poorly controlled. Check A1c. Continue pioglitazone  30 mg daily, tirzepatide  7.5 mg weekly, insulin  glargine 45 units every morning. Hold SGLT-2i in setting of intertrigo.

## 2023-12-04 NOTE — Assessment & Plan Note (Signed)
 Good BP today. Appropriate to continue holding lisinopril  for now.

## 2023-12-04 NOTE — Progress Notes (Signed)
 Patient name: Nathan [name] Date of birth: Feb 26, 1951 Date of visit: 12/04/23  Subjective   Chief concern: yeast infection around my groin and butt  Started a few days ago. Wonders if it's because of the antibiotics he's been taking. He's been on antibiotics for a couple of weeks now.  No sexual activity between start of antibiotics and now.  Follow up Hospitalization  Patient was admitted to [facility] on 11/27/23 and discharged on 11/28/23. He was treated for MRSA skin infection. Treatment for this included IV antibiotics. Telephone follow up was done on 11/29/23 He reports excellent compliance with treatment. He reports this condition is improved.  Review of Systems  Constitutional:  Positive for malaise/fatigue. Negative for chills, fever and weight loss.  HENT:  Negative for sore throat.   Gastrointestinal:  Negative for diarrhea.  Psychiatric/Behavioral:  The patient does not have insomnia.     Current Outpatient Medications  Medication Instructions   acetaminophen  (TYLENOL ) 1,000 mg, Oral, Every 6 hours   atorvastatin  (LIPITOR) 40 MG tablet TAKE 1 TABLET(40 MG) BY MOUTH DAILY   bictegravir-emtricitabine -tenofovir  AF (BIKTARVY ) 50-200-25 MG TABS tablet 1 tablet, Oral, Daily   Blood Glucose Monitoring Suppl (FREESTYLE LITE) w/Device KIT 1 Device, Does not apply, Daily   cilostazol  (PLETAL ) 50 MG tablet TAKE 1 TABLET(50 MG) BY MOUTH TWICE DAILY   clopidogrel  (PLAVIX ) 75 MG tablet TAKE 1 TABLET(75 MG) BY MOUTH DAILY   clotrimazole  (LOTRIMIN ) 1 % cream 1 Application, Topical, 2 times daily   Continuous Glucose Sensor (FREESTYLE LIBRE 3 PLUS SENSOR) MISC Change sensor every 15 days.   empagliflozin  (JARDIANCE ) 25 mg, Oral, Daily   ezetimibe  (ZETIA ) 10 MG tablet TAKE 1 TABLET(10 MG) BY MOUTH DAILY   glucose blood (FREESTYLE LITE) test strip Use as instructed   glucose blood (FREESTYLE LITE) test strip Use 5 times daily to check blood sugar.   HUMALOG  100 UNIT/ML injection Max  daily 110 units per CeQur   injection device for insulin  (CEQUR SIMPLICITY 2U) DEVI change every 2 days   Injection Device for Insulin  (CEQUR SIMPLICITY INSERTER) MISC Use to Apply a new cecur simplicity patch as directed every 2 days   Insulin  Pen Needle (BD PEN NEEDLE NANO U/F) 32G X 4 MM MISC 1 Device, Other, Daily, USE AS DIRECTED FOUR TIMES DAILY. Dx code  E11.42   Insulin  Syringe-Needle U-100 (GNP INSULIN  SYRINGES) 30G X 5/16 1 ML MISC 1 Device, Does not apply, Daily   Lancet Devices (BAYER MICROLET 2 LANCING DEVIC) MISC Use to check blood sugar up to 3 ties a day   Lancets (FREESTYLE) lancets Use 5 times daily to check blood sugar.   linezolid  (ZYVOX ) 600 mg, Oral, Every 12 hours   lipase/protease/amylase (CREON ) 36000 UNITS CPEP capsule TAKE 1 CAPSULE BY MOUTH THREE TIMES DAILY BEFORE A MEAL   lisinopril  (ZESTRIL ) 40 MG tablet TAKE 1 TABLET BY MOUTH DAILY. HOLD IF SYSTOLIC BLOOD PRESSURE IS LESS THAN 110   omeprazole  (PRILOSEC) 40 MG capsule TAKE 1 CAPSULE(40 MG) BY MOUTH DAILY   pioglitazone  (ACTOS ) 30 MG tablet TAKE 1 TABLET(30 MG) BY MOUTH DAILY   tirzepatide  (MOUNJARO ) 7.5 mg, Subcutaneous, Weekly   Toujeo  Max SoloStar 45 Units, Subcutaneous, Every morning     Objective  Today's Vitals   12/04/23 1404  BP: (!) 129/54  Pulse: 79  Temp: 97.6 F (36.4 C)  TempSrc: Oral  SpO2: 95%  Weight: 184 lb 9.6 oz (83.7 kg)  Height: 6' 1 (1.854 m)  PainSc: 2  PainLoc: Arm  Body mass index is 24.36 kg/m.   Physical Exam Constitutional:      General: He is not in acute distress.    Appearance: Normal appearance.  Neck:     Thyroid: No thyroid mass, thyromegaly or thyroid tenderness.     Vascular: No carotid bruit.  Cardiovascular:     Rate and Rhythm: Normal rate and regular rhythm.     Pulses: Normal pulses.     Heart sounds: No murmur heard. Pulmonary:     Effort: Pulmonary effort is normal.     Breath sounds: Normal breath sounds. No wheezing or rales.  Musculoskeletal:      Left lower leg: No edema.  Lymphadenopathy:     Cervical: No cervical adenopathy.  Skin:    General: Skin is warm and dry.     Comments: Wound on the right forearm is healing well compared to prior exam Eschar on the left leg is also healing Beefy red plaque in the groin  Neurological:     Mental Status: He is alert. Mental status is at baseline.     Cranial Nerves: No facial asymmetry.     Motor: No tremor.  Psychiatric:        Mood and Affect: Mood normal.        Behavior: Behavior normal.      Assessment & Plan  Right arm cellulitis Assessment & Plan: Doing better, cellulitis improving. No systemic symptoms. A couple of days of linezolid  left.    Eschar of lower leg Assessment & Plan: Healing, ABIs while hospitalized demonstrate known PAD.   Intertrigo Assessment & Plan: In groin and buttocks. Mild but itchy and uncomfortable. Suspect a complication of prolonged antibiotic use. Start clotrimazole . Hold SGLT-2 for now.  Orders: -     Clotrimazole ; Apply 1 Application topically 2 (two) times daily.  Dispense: 30 g; Refill: 0  Benign essential HTN Assessment & Plan: Good BP today. Appropriate to continue holding lisinopril  for now.  Orders: -     Basic metabolic panel with GFR  Type 2 diabetes mellitus with diabetic peripheral angiopathy without gangrene, with long-term current use of insulin  (HCC) Assessment & Plan: Chronic and stable. Somewhat poorly controlled. Check A1c. Continue pioglitazone  30 mg daily, tirzepatide  7.5 mg weekly, insulin  glargine 45 units every morning. Hold SGLT-2i in setting of intertrigo.  Orders: -     Hemoglobin A1c    Return in about 3 months (around 03/05/2024) for routine physical.  Ozell Kung MD 12/04/2023, 9:51 PM

## 2023-12-04 NOTE — Assessment & Plan Note (Signed)
 Doing better, cellulitis improving. No systemic symptoms. A couple of days of linezolid  left.

## 2023-12-04 NOTE — Assessment & Plan Note (Signed)
 Healing, ABIs while hospitalized demonstrate known PAD.

## 2023-12-04 NOTE — Patient Instructions (Signed)
 Return in about 3 months (around 03/05/2024) for routine physical.  Remember to bring all of the medications that you take (including over the counter medications and supplements) with you to every clinic visit.  This after visit summary is an important review of tests, referrals, and medication changes that were discussed during your visit. If you have questions or concerns, call [phone]. Outside of clinic business hours, call the main hospital at [phone] and ask the operator for the on-call internal medicine resident.   Ozell Kung MD 12/04/2023, 2:45 PM

## 2023-12-05 ENCOUNTER — Ambulatory Visit: Payer: Self-pay | Admitting: Student

## 2023-12-05 DIAGNOSIS — E1169 Type 2 diabetes mellitus with other specified complication: Secondary | ICD-10-CM

## 2023-12-05 LAB — URINE CYTOLOGY ANCILLARY ONLY
Chlamydia: NEGATIVE
Comment: NEGATIVE
Comment: NORMAL
Neisseria Gonorrhea: NEGATIVE

## 2023-12-05 LAB — BASIC METABOLIC PANEL WITH GFR
BUN/Creatinine Ratio: 12 (ref 10–24)
BUN: 12 mg/dL (ref 8–27)
CO2: 21 mmol/L (ref 20–29)
Calcium: 9.2 mg/dL (ref 8.6–10.2)
Chloride: 103 mmol/L (ref 96–106)
Creatinine, Ser: 0.99 mg/dL (ref 0.76–1.27)
Glucose: 196 mg/dL — ABNORMAL HIGH (ref 70–99)
Potassium: 4.2 mmol/L (ref 3.5–5.2)
Sodium: 139 mmol/L (ref 134–144)
eGFR: 80 mL/min/1.73 (ref >59)

## 2023-12-05 LAB — HEMOGLOBIN A1C
Est. average glucose Bld gHb Est-mCnc: 226 mg/dL
Hgb A1c MFr Bld: 9.5 % — ABNORMAL HIGH (ref 4.8–5.6)

## 2023-12-05 LAB — T-HELPER CELL (CD4) - (RCID CLINIC ONLY)
CD4 % Helper T Cell: 21 % — ABNORMAL LOW (ref 33–65)
CD4 T Cell Abs: 691 /uL (ref 400–1790)

## 2023-12-05 MED ORDER — TOUJEO MAX SOLOSTAR 300 UNIT/ML ~~LOC~~ SOPN: 50.0000 [IU] | PEN_INJECTOR | Freq: Every morning | SUBCUTANEOUS | Status: DC

## 2023-12-05 NOTE — Telephone Encounter (Signed)
 Ongoing hyperglycemia. Increase insulin  glargine from 45 to 50 units. He's going to put a CGM on tomorrow morning and we'll follow-up in 2 weeks for insulin  and other medication adjustments.  Meds ordered this encounter  Medications   insulin  glargine, 2 Unit Dial, (TOUJEO  MAX SOLOSTAR) 300 UNIT/ML Solostar Pen    Sig: Inject 50 Units into the skin in the morning.   Ozell Kung MD 12/05/2023, 5:14 PM

## 2023-12-06 LAB — LIPID PANEL
Cholesterol: 116 mg/dL (ref <200)
HDL: 45 mg/dL (ref >=40)
LDL Cholesterol (Calc): 48 mg/dL
Non-HDL Cholesterol (Calc): 71 mg/dL (ref <130)
Total CHOL/HDL Ratio: 2.6 (calc) (ref <5.0)
Triglycerides: 147 mg/dL (ref <150)

## 2023-12-06 LAB — HIV-1 RNA QUANT-NO REFLEX-BLD
HIV 1 RNA Quant: NOT DETECTED {copies}/mL
HIV-1 RNA Quant, Log: NOT DETECTED {Log_copies}/mL

## 2023-12-06 LAB — RPR: RPR Ser Ql: NONREACTIVE

## 2023-12-10 ENCOUNTER — Ambulatory Visit (HOSPITAL_COMMUNITY): Payer: Medicare (Managed Care)

## 2023-12-10 ENCOUNTER — Ambulatory Visit: Payer: Medicare (Managed Care)

## 2023-12-11 ENCOUNTER — Other Ambulatory Visit: Payer: Self-pay | Admitting: Internal Medicine

## 2023-12-11 DIAGNOSIS — E1169 Type 2 diabetes mellitus with other specified complication: Secondary | ICD-10-CM

## 2023-12-17 ENCOUNTER — Ambulatory Visit: Payer: Medicare (Managed Care)

## 2023-12-17 ENCOUNTER — Encounter (HOSPITAL_COMMUNITY): Payer: Medicare (Managed Care)

## 2023-12-17 NOTE — Progress Notes (Signed)
 Chief complaint: follow-up for HIV disease on medications  Subjective:    Patient ID: Nathan [name], male    DOB: 07-16-1950, 73 y.o.   MRN: [id number]  HPI  Discussed the use of AI scribe software for clinical note transcription with the patient, who gave verbal consent to proceed.  History of Present Illness   Nathan [name] is a 73 year old male who presents after having suffered an arm infection requiring incision and drainage.  He had abscess that required I and D and which yielded MRSA and he was dc on zyvox . He is uncertain about how the infection occurred, noting that he had a boil a couple of weeks prior to the current infection. No history of recurrent boils or similar infections prior to the recent arm infection.  He has a history of HIV, with recent labs from July showing an undetectable viral load and a CD4 count of 691. He is currently on Biktarvy  for HIV management, which he picks up from [facility]  He also has diabetes, with a recent hemoglobin A1c of 9.5. His diabetes management includes insulin  and Mounjaro , which was recently switched from Ozempic . He is on lipitor and Zetia  for cholesterol management and lisinopril  for blood pressure control.   He also has PVD  He has received a shingles vaccine a few years ago, although the exact date is not recalled.      Past Medical History:  Diagnosis Date   Asthma    per 2003 [facility] pulm records pfts    Blood dyscrasia    HIV   Carotid artery occlusion    Clotting disorder (HCC)    Colon polyps    noted previous colonoscopy [facility]   DDD (degenerative disc disease)    cervical spine   Depression    Diabetes mellitus    dx 2010   Diarrhea 02/24/2021   Dizziness 08/25/2021   Falls 08/25/2021   Fever    unknwon origin   GERD (gastroesophageal reflux disease)    Head swelling 05/23/2017   Hep C w/o coma, chronic (HCC)    History of syphilis    noted [facility] records   HIV infection (HCC)    undetectable viral  load and CD4 ct 667 as of 11/2011   Hyperlipidemia    Hypertension    MRSA (methicillin resistant Staphylococcus aureus)    Multiple sclerosis (HCC)    in remission as of 11/2011 (diagnosed late 1980s)   Pain in limb-Right Leg 10/13/2013   Pancreas (digestive gland) works poorly    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HCC)    2002   Pneumonia    Prosthesis fitting 03/10/2019   PVD (peripheral vascular disease) (HCC)    Left Stent 07/02/2008   Scalp lesion 07/26/2017   UTI (urinary tract infection)    Wears dentures     Past Surgical History:  Procedure Laterality Date   ABDOMINAL AORTOGRAM W/LOWER EXTREMITY N/A 08/07/2017   Procedure: ABDOMINAL AORTOGRAM W/LOWER EXTREMITY;  Surgeon: Serene Gaile ORN, MD;  Location: [facility] INVASIVE CV LAB;  Service: Cardiovascular;  Laterality: N/A;   ABOVE KNEE LEG AMPUTATION  2009   Right Leg   COLONOSCOPY W/ BIOPSIES AND POLYPECTOMY     ENDARTERECTOMY Left 02/22/2023   Procedure: LEFT ENDARTERECTOMY CAROTID;  Surgeon: Serene Gaile ORN, MD;  Location: [facility] OR;  Service: Vascular;  Laterality: Left;   LOWER EXTREMITY ANGIOGRAM Left 12/26/2011   Procedure: LOWER EXTREMITY ANGIOGRAM;  Surgeon: Gaile ORN Serene, MD;  Location: [facility] CATH  LAB;  Service: Cardiovascular;  Laterality: Left;   LOWER EXTREMITY ANGIOGRAM Left 08/17/2017   Procedure: LOWER EXTREMITY ANGIOGRAM LEFT LEG WITH RUNOFF AND Stenting.;  Surgeon: Serene Gaile ORN, MD;  Location: [facility] OR;  Service: Vascular;  Laterality: Left;   MULTIPLE TOOTH EXTRACTIONS     OTHER SURGICAL HISTORY     left left with stents (Dr. Celso)   OTHER SURGICAL HISTORY     2003 colonoscopy 5 mm polyp transverse colon; (2)64mm  polyps in rectum-hyperplastic   PATCH ANGIOPLASTY Left 02/22/2023   Procedure: PATCH ANGIOPLASTY 1CMx14CM GEORGE LISLE;  Surgeon: Serene Gaile ORN, MD;  Location: [facility] OR;  Service: Vascular;  Laterality: Left;   PERIPHERAL VASCULAR INTERVENTION Left 08/17/2017   Procedure: POPLITEAL  STENT;  Surgeon: Serene Gaile ORN, MD;  Location: [facility] OR;  Service: Vascular;  Laterality: Left;    Family History  Problem Relation Age of Onset   Diabetes Mother    Coronary artery disease Mother    Coronary artery disease Father    Liver disease Sister    Cancer Brother        colon caner stage 4 as of 11/2011 (unknown age of onset)   Prostate cancer Brother    Colon cancer Brother    Rectal cancer Neg Hx    Esophageal cancer Neg Hx       Social History   Socioeconomic History   Marital status: Single    Spouse name: Not on file   Number of children: 1   Years of education: 12   Highest education level: Not on file  Occupational History   Occupation: retired  Tobacco Use   Smoking status: Former    Current packs/day: 0.00    Types: Cigarettes    Quit date: 1967    Years since quitting: 58.6    Passive exposure: Never   Smokeless tobacco: Former    Types: Chew    Quit date: 1980  Vaping Use   Vaping status: Never Used  Substance and Sexual Activity   Alcohol use: No    Alcohol/week: 0.0 standard drinks of alcohol   Drug use: No   Sexual activity: Yes    Partners: Male    Comment: declined condoms  Other Topics Concern   Not on file  Social History Narrative   Not on file   Social Drivers of Health   Financial Resource Strain: Low Risk  (04/04/2022)   Overall Financial Resource Strain (CARDIA)    Difficulty of Paying Living Expenses: Not hard at all  Food Insecurity: No Food Insecurity (11/27/2023)   Hunger Vital Sign    Worried About Running Out of Food in the Last Year: Never true    Ran Out of Food in the Last Year: Never true  Transportation Needs: No Transportation Needs (11/27/2023)   PRAPARE - Administrator, Civil Service (Medical): No    Lack of Transportation (Non-Medical): No  Physical Activity: Insufficiently Active (04/04/2022)   Exercise Vital Sign    Days of Exercise per Week: 1 day    Minutes of Exercise per Session: 20 min   Stress: No Stress Concern Present (04/04/2022)   [employer] of Occupational Health - Occupational Stress Questionnaire    Feeling of Stress : Not at all  Social Connections: Socially Isolated (11/27/2023)   Social Connection and Isolation Panel    Frequency of Communication with Friends and Family: Never    Frequency of Social Gatherings with Friends and Family: Once a week  Attends Religious Services: Never    Active Member of Clubs or Organizations: No    Attends Banker Meetings: Never    Marital Status: Divorced    Allergies  Allergen Reactions   Sulfonamide Derivatives Hives     Current Outpatient Medications:    acetaminophen  (TYLENOL ) 500 MG tablet, Take 2 tablets (1,000 mg total) by mouth every 6 (six) hours., Disp: 30 tablet, Rfl: 0   atorvastatin  (LIPITOR) 40 MG tablet, TAKE 1 TABLET(40 MG) BY MOUTH DAILY, Disp: 90 tablet, Rfl: 1   bictegravir-emtricitabine -tenofovir  AF (BIKTARVY ) 50-200-25 MG TABS tablet, Take 1 tablet by mouth daily., Disp: 30 tablet, Rfl: 11   Blood Glucose Monitoring Suppl (FREESTYLE LITE) w/Device KIT, 1 Device by Does not apply route daily in the afternoon., Disp: 1 kit, Rfl: 0   cilostazol  (PLETAL ) 50 MG tablet, TAKE 1 TABLET(50 MG) BY MOUTH TWICE DAILY, Disp: 60 tablet, Rfl: 3   clopidogrel  (PLAVIX ) 75 MG tablet, TAKE 1 TABLET(75 MG) BY MOUTH DAILY, Disp: 90 tablet, Rfl: 3   clotrimazole  (LOTRIMIN ) 1 % cream, Apply 1 Application topically 2 (two) times daily., Disp: 30 g, Rfl: 0   Continuous Glucose Sensor (FREESTYLE LIBRE 3 PLUS SENSOR) MISC, Change sensor every 15 days., Disp: 6 each, Rfl: 3   empagliflozin  (JARDIANCE ) 25 MG TABS tablet, Take 1 tablet (25 mg total) by mouth daily., Disp: 90 tablet, Rfl: 3   ezetimibe  (ZETIA ) 10 MG tablet, TAKE 1 TABLET(10 MG) BY MOUTH DAILY, Disp: 90 tablet, Rfl: 2   glucose blood (FREESTYLE LITE) test strip, Use as instructed, Disp: 100 each, Rfl: 12   glucose blood (FREESTYLE LITE) test  strip, Use 5 times daily to check blood sugar., Disp: 450 each, Rfl: 3   HUMALOG  100 UNIT/ML injection, INJECT A MAX OF 110 UNITS DAILY PER CEQUR, Disp: 110 mL, Rfl: 3   injection device for insulin  (CEQUR SIMPLICITY 2U) DEVI, change every 2 days, Disp: 45 each, Rfl: 3   Injection Device for Insulin  (CEQUR SIMPLICITY INSERTER) MISC, Use to Apply a new cecur simplicity patch as directed every 2 days, Disp: 1 each, Rfl: 1   insulin  glargine, 2 Unit Dial, (TOUJEO  MAX SOLOSTAR) 300 UNIT/ML Solostar Pen, Inject 50 Units into the skin in the morning., Disp: , Rfl:    Insulin  Pen Needle (BD PEN NEEDLE NANO U/F) 32G X 4 MM MISC, 1 Device by Other route daily in the afternoon. USE AS DIRECTED FOUR TIMES DAILY. Dx code  E11.42, Disp: 100 each, Rfl: 3   Insulin  Syringe-Needle U-100 (GNP INSULIN  SYRINGES) 30G X 5/16 1 ML MISC, 1 Device by Does not apply route daily in the afternoon., Disp: 100 each, Rfl: 3   Lancet Devices (BAYER MICROLET 2 LANCING DEVIC) MISC, Use to check blood sugar up to 3 ties a day, Disp: 1 each, Rfl: 2   Lancets (FREESTYLE) lancets, Use 5 times daily to check blood sugar., Disp: 450 each, Rfl: 3   linezolid  (ZYVOX ) 600 MG tablet, Take 1 tablet (600 mg total) by mouth every 12 (twelve) hours for 6 days., Disp: 12 tablet, Rfl: 0   lipase/protease/amylase (CREON ) 36000 UNITS CPEP capsule, TAKE 1 CAPSULE BY MOUTH THREE TIMES DAILY BEFORE A MEAL (Patient taking differently: Take 36,000 Units by mouth 2 (two) times daily with a meal.), Disp: 200 capsule, Rfl: 0   lisinopril  (ZESTRIL ) 40 MG tablet, TAKE 1 TABLET BY MOUTH DAILY. HOLD IF SYSTOLIC BLOOD PRESSURE IS LESS THAN 110 (Patient not taking: Reported on 11/30/2023), Disp: 90  tablet, Rfl: 3   omeprazole  (PRILOSEC) 40 MG capsule, TAKE 1 CAPSULE(40 MG) BY MOUTH DAILY, Disp: 90 capsule, Rfl: 1   pioglitazone  (ACTOS ) 30 MG tablet, TAKE 1 TABLET(30 MG) BY MOUTH DAILY, Disp: 90 tablet, Rfl: 3   tirzepatide  (MOUNJARO ) 7.5 MG/0.5ML Pen, Inject 7.5 mg  into the skin once a week., Disp: 6 mL, Rfl: 3   Review of Systems  Constitutional:  Negative for activity change, appetite change, chills, diaphoresis, fatigue, fever and unexpected weight change.  HENT:  Negative for congestion, rhinorrhea, sinus pressure, sneezing, sore throat and trouble swallowing.   Eyes:  Negative for photophobia and visual disturbance.  Respiratory:  Negative for cough, chest tightness, shortness of breath, wheezing and stridor.   Cardiovascular:  Negative for chest pain, palpitations and leg swelling.  Gastrointestinal:  Negative for abdominal distention, abdominal pain, anal bleeding, blood in stool, constipation, diarrhea, nausea and vomiting.  Genitourinary:  Negative for difficulty urinating, dysuria, flank pain and hematuria.  Musculoskeletal:  Negative for arthralgias, back pain, gait problem, joint swelling and myalgias.  Skin:  Negative for color change, pallor, rash and wound.  Neurological:  Negative for dizziness, tremors, weakness and light-headedness.  Hematological:  Negative for adenopathy. Does not bruise/bleed easily.  Psychiatric/Behavioral:  Negative for agitation, behavioral problems, confusion, decreased concentration, dysphoric mood and sleep disturbance.        Objective:   Physical Exam Constitutional:      Appearance: He is well-developed.  HENT:     Head: Normocephalic and atraumatic.  Eyes:     Conjunctiva/sclera: Conjunctivae normal.  Cardiovascular:     Rate and Rhythm: Normal rate and regular rhythm.  Pulmonary:     Effort: Pulmonary effort is normal. No respiratory distress.     Breath sounds: No wheezing.  Abdominal:     General: There is no distension.     Palpations: Abdomen is soft.  Musculoskeletal:        General: No tenderness. Normal range of motion.     Cervical back: Normal range of motion and neck supple.  Skin:    General: Skin is warm and dry.     Coloration: Skin is not pale.     Findings: No erythema or  rash.  Neurological:     General: No focal deficit present.     Mental Status: He is alert and oriented to person, place, and time.  Psychiatric:        Mood and Affect: Mood normal.        Behavior: Behavior normal.        Thought Content: Thought content normal.        Judgment: Judgment normal.           Assessment & Plan:   Assessment and Plan    HIV disease HIV well-controlled with undetectable viral load and CD4 count of 691. Current ART effective. - Continue Biktarvy . - Ensure Biktarvy  pickup at [facility] on [address].    MRSA skin and soft tissue infection of right arm Recent MRSA infection likely due to colonization. Decolonization not needed unless recurrent. - Discussed decolonization options: Hibiclens  for a week or bleach baths three times a week for two months in each case with IN mupirocin  - Consider decolonization if recurrent infections occur.  Type 2 diabetes mellitus Suboptimal control with hemoglobin A1c of 9.5. Managed with insulin  and Mounjaro . - Continue insulin  and Mounjaro .  Hypertension Managed with lisinopril . - Continue lisinopril .  Hyperlipidemia Managed with Crestor and Zetia . - Continue lipitor and  Zetia.  PVD: on pletal, plavix

## 2023-12-18 ENCOUNTER — Encounter: Payer: Self-pay | Admitting: Infectious Disease

## 2023-12-18 ENCOUNTER — Other Ambulatory Visit: Payer: Self-pay

## 2023-12-18 ENCOUNTER — Ambulatory Visit: Payer: Medicare (Managed Care) | Admitting: Infectious Disease

## 2023-12-18 VITALS — BP 113/57 | HR 80 | Temp 97.7°F | Wt 185.0 lb

## 2023-12-18 DIAGNOSIS — I10 Essential (primary) hypertension: Secondary | ICD-10-CM

## 2023-12-18 DIAGNOSIS — Z21 Asymptomatic human immunodeficiency virus [HIV] infection status: Secondary | ICD-10-CM

## 2023-12-18 DIAGNOSIS — I739 Peripheral vascular disease, unspecified: Secondary | ICD-10-CM

## 2023-12-18 DIAGNOSIS — Z7185 Encounter for immunization safety counseling: Secondary | ICD-10-CM

## 2023-12-18 DIAGNOSIS — E785 Hyperlipidemia, unspecified: Secondary | ICD-10-CM

## 2023-12-18 DIAGNOSIS — L089 Local infection of the skin and subcutaneous tissue, unspecified: Secondary | ICD-10-CM | POA: Diagnosis not present

## 2023-12-18 DIAGNOSIS — Z113 Encounter for screening for infections with a predominantly sexual mode of transmission: Secondary | ICD-10-CM

## 2023-12-18 DIAGNOSIS — E119 Type 2 diabetes mellitus without complications: Secondary | ICD-10-CM | POA: Diagnosis not present

## 2023-12-18 DIAGNOSIS — Z7985 Long-term (current) use of injectable non-insulin antidiabetic drugs: Secondary | ICD-10-CM | POA: Diagnosis not present

## 2023-12-18 DIAGNOSIS — E1151 Type 2 diabetes mellitus with diabetic peripheral angiopathy without gangrene: Secondary | ICD-10-CM

## 2023-12-18 DIAGNOSIS — Z794 Long term (current) use of insulin: Secondary | ICD-10-CM | POA: Diagnosis not present

## 2023-12-18 DIAGNOSIS — Z22322 Carrier or suspected carrier of Methicillin resistant Staphylococcus aureus: Secondary | ICD-10-CM | POA: Diagnosis not present

## 2023-12-18 DIAGNOSIS — L02419 Cutaneous abscess of limb, unspecified: Secondary | ICD-10-CM

## 2023-12-18 DIAGNOSIS — E1169 Type 2 diabetes mellitus with other specified complication: Secondary | ICD-10-CM

## 2023-12-18 DIAGNOSIS — B20 Human immunodeficiency virus [HIV] disease: Secondary | ICD-10-CM

## 2023-12-18 MED ORDER — BIKTARVY 50-200-25 MG PO TABS: 1.0000 | ORAL_TABLET | Freq: Every day | ORAL | 11 refills | Status: AC

## 2023-12-21 NOTE — Progress Notes (Signed)
 Internal Medicine Clinic Attending  Case discussed with the resident at the time of the visit.  We reviewed the resident's history and exam and pertinent patient test results.  I agree with the assessment, diagnosis, and plan of care documented in the resident's note.

## 2023-12-23 ENCOUNTER — Other Ambulatory Visit: Payer: Self-pay | Admitting: Student

## 2023-12-25 ENCOUNTER — Ambulatory Visit: Payer: Medicare (Managed Care) | Admitting: Student

## 2023-12-25 ENCOUNTER — Other Ambulatory Visit: Payer: Self-pay | Admitting: Student

## 2023-12-25 VITALS — BP 109/69 | HR 73 | Temp 97.8°F | Ht 73.0 in | Wt 184.4 lb

## 2023-12-25 DIAGNOSIS — Z7985 Long-term (current) use of injectable non-insulin antidiabetic drugs: Secondary | ICD-10-CM | POA: Diagnosis not present

## 2023-12-25 DIAGNOSIS — T8789 Other complications of amputation stump: Secondary | ICD-10-CM | POA: Insufficient documentation

## 2023-12-25 DIAGNOSIS — E11649 Type 2 diabetes mellitus with hypoglycemia without coma: Secondary | ICD-10-CM | POA: Diagnosis not present

## 2023-12-25 DIAGNOSIS — E1151 Type 2 diabetes mellitus with diabetic peripheral angiopathy without gangrene: Secondary | ICD-10-CM

## 2023-12-25 DIAGNOSIS — Z794 Long term (current) use of insulin: Secondary | ICD-10-CM

## 2023-12-25 MED ORDER — MOUNJARO 10 MG/0.5ML ~~LOC~~ SOAJ: 10.0000 mg | SUBCUTANEOUS | 0 refills | Status: AC

## 2023-12-25 MED ORDER — HUMALOG 100 UNIT/ML IJ SOLN: INTRAMUSCULAR | Status: DC

## 2023-12-25 MED ORDER — MOUNJARO 15 MG/0.5ML ~~LOC~~ SOAJ: 15.0000 mg | SUBCUTANEOUS | 3 refills | Status: DC

## 2023-12-25 MED ORDER — MOUNJARO 12.5 MG/0.5ML ~~LOC~~ SOAJ: 12.5000 mg | SUBCUTANEOUS | 0 refills | Status: AC

## 2023-12-25 MED ORDER — LINEZOLID 600 MG PO TABS: 600.0000 mg | ORAL_TABLET | Freq: Two times a day (BID) | ORAL | 0 refills | Status: AC

## 2023-12-25 NOTE — Progress Notes (Signed)
 Patient name: Nathan [name] Date of birth: 09-Mar-1951 Date of visit: 12/25/23  Subjective   Reason for visit: right leg stump infection  Started within the last week. Having pain, some clear drainage from the stump. First sign was stinging/burning at the stump. Been getting worse day by day. Now he's having a lot of trouble walking because of pain at the stump with his prosthesis.  He sees [facility] for diabetes care. He's taking basal-bolus insulin , 50 units of Toujeo  in the morning and using 30 units of Humalog  before his main meals of the day.  The past 2 days he is noted a blood sugar of around 70 in the morning.  Today he took Humalog  before rushing out of his house to make this appointment and as such he did not have anything to eat so his blood sugar is currently 60.  Review of Systems  Constitutional:  Negative for chills and fever.    Current Outpatient Medications  Medication Instructions   acetaminophen  (TYLENOL ) 1,000 mg, Oral, Every 6 hours   atorvastatin  (LIPITOR) 40 MG tablet TAKE 1 TABLET(40 MG) BY MOUTH DAILY   bictegravir-emtricitabine -tenofovir  AF (BIKTARVY ) 50-200-25 MG TABS tablet 1 tablet, Oral, Daily   Blood Glucose Monitoring Suppl (FREESTYLE LITE) w/Device KIT 1 Device, Does not apply, Daily   cilostazol  (PLETAL ) 50 MG tablet TAKE 1 TABLET(50 MG) BY MOUTH TWICE DAILY   clopidogrel  (PLAVIX ) 75 MG tablet TAKE 1 TABLET(75 MG) BY MOUTH DAILY   clotrimazole  (LOTRIMIN ) 1 % cream 1 Application, Topical, 2 times daily   Continuous Glucose Sensor (FREESTYLE LIBRE 3 PLUS SENSOR) MISC Change sensor every 15 days.   empagliflozin  (JARDIANCE ) 25 mg, Oral, Daily   ezetimibe  (ZETIA ) 10 MG tablet TAKE 1 TABLET(10 MG) BY MOUTH DAILY   glucose blood (FREESTYLE LITE) test strip Use as instructed   glucose blood (FREESTYLE LITE) test strip Use 5 times daily to check blood sugar.   HUMALOG  100 UNIT/ML injection INJECT A MAX OF 110 UNITS DAILY PER CEQUR   injection  device for insulin  (CEQUR SIMPLICITY 2U) DEVI change every 2 days   Injection Device for Insulin  (CEQUR SIMPLICITY INSERTER) MISC Use to Apply a new cecur simplicity patch as directed every 2 days   Insulin  Pen Needle (BD PEN NEEDLE NANO U/F) 32G X 4 MM MISC 1 Device, Other, Daily, USE AS DIRECTED FOUR TIMES DAILY. Dx code  E11.42   Insulin  Syringe-Needle U-100 (GNP INSULIN  SYRINGES) 30G X 5/16 1 ML MISC 1 Device, Does not apply, Daily   Lancet Devices (BAYER MICROLET 2 LANCING DEVIC) MISC Use to check blood sugar up to 3 ties a day   Lancets (FREESTYLE) lancets Use 5 times daily to check blood sugar.   linezolid  (ZYVOX ) 600 mg, Oral, Every 12 hours   lipase/protease/amylase (CREON ) 36000 UNITS CPEP capsule TAKE 1 CAPSULE BY MOUTH THREE TIMES DAILY BEFORE A MEAL   lisinopril  (ZESTRIL ) 40 MG tablet TAKE 1 TABLET BY MOUTH DAILY. HOLD IF SYSTOLIC BLOOD PRESSURE IS LESS THAN 110   omeprazole  (PRILOSEC) 40 MG capsule TAKE 1 CAPSULE(40 MG) BY MOUTH DAILY   pioglitazone  (ACTOS ) 30 MG tablet TAKE 1 TABLET(30 MG) BY MOUTH DAILY   tirzepatide  (MOUNJARO ) 7.5 mg, Subcutaneous, Weekly   Toujeo  Max SoloStar 50 Units, Subcutaneous, Every morning     Objective  Today's Vitals   12/25/23 0846 12/25/23 0853  BP: (!) 148/73 109/69  Pulse: 73 73  Temp: 97.8 F (36.6 C)   TempSrc: Oral   SpO2: 96%  Weight: 184 lb 6.4 oz (83.6 kg)   Height: 6' 1 (1.854 m)   Body mass index is 24.33 kg/m.   Physical Exam Constitutional:      Appearance: Normal appearance.  Cardiovascular:     Rate and Rhythm: Normal rate and regular rhythm.  Pulmonary:     Effort: Pulmonary effort is normal. No respiratory distress.  Skin:    General: Skin is warm and dry.     Comments: Some erythema, tenderness, and induration at base of the stump with small open or ulcerated area and some clear drainage, no fluctuance  Neurological:     Mental Status: He is alert.     Cranial Nerves: No facial asymmetry.  Psychiatric:         Mood and Affect: Affect normal.        Speech: Speech normal.        Behavior: Behavior normal.      Assessment & Plan   Type 2 diabetes mellitus with diabetic peripheral angiopathy without gangrene, with long-term current use of insulin  (HCC) Assessment & Plan: Chronic, poorly controlled.  Lab Results  Component Value Date   HGBA1C 9.5 (H) 12/04/2023   HGBA1C 8.6 (A) 08/01/2023   HGBA1C 8.3 (A) 05/25/2023   With hyperglycemia.  Continue pioglitazone  30 mg daily, Humalog  30 units with main meals of the day, Toujeo  50 units every morning.  Start increasing tirzepatide  every 4 weeks.  Recommended 35 units of Humalog  with breakfast especially if this meal consists of sugary cereal or a biscuit.  Orders: -     Mounjaro ; Inject 10 mg into the skin once a week for 28 days.  Dispense: 2 mL; Refill: 0 -     Mounjaro ; Inject 12.5 mg into the skin once a week for 28 days.  Dispense: 2 mL; Refill: 0 -     Mounjaro ; Inject 15 mg into the skin once a week.  Dispense: 2 mL; Refill: 3 -     HumaLOG ; Use 35 units with breakfast and 30 units with your other main meals of the day  Other complications of amputation stump (HCC) Assessment & Plan: Right AKA stump with a probable pressure injury and some small ulceration.  Tissue somewhat indurated but no fluctuance, thus no drainable abscess.  Some serous appearing drainage.  SSTI is less likely.  Recommending offloading the stump, using crutches to move around without the prosthesis is much as possible for the next few days.  But given his recent history of MRSA SSTI I will send him home with linezolid  to be used in case the wound progresses despite offloading.  He will call back if this occurs or if he develops purulent drainage or increased swelling despite offloading, or fevers.  Orders: -     Linezolid ; Take 1 tablet (600 mg total) by mouth every 12 (twelve) hours for 6 days.  Dispense: 12 tablet; Refill: 0  Hypoglycemia associated with diabetes  (HCC) Assessment & Plan: Mild asymptomatic hypoglycemia today.  He took his Humalog  before leaving for his appointment and did not eat breakfast.  Snack of peanut butter crackers was provided and blood sugar was improving to >70 mg/dL prior to his departure from clinic.     Return for preventive health exam in 3 months or sooner if symptoms worsen or fail to improve.  Ozell Kung MD 12/25/2023, 2:08 PM

## 2023-12-25 NOTE — Assessment & Plan Note (Signed)
 Chronic, poorly controlled.  Lab Results  Component Value Date   HGBA1C 9.5 (H) 12/04/2023   HGBA1C 8.6 (A) 08/01/2023   HGBA1C 8.3 (A) 05/25/2023   With hyperglycemia.  Continue pioglitazone  30 mg daily, Humalog  30 units with main meals of the day, Toujeo  50 units every morning.  Start increasing tirzepatide  every 4 weeks.  Recommended 35 units of Humalog  with breakfast especially if this meal consists of sugary cereal or a biscuit.

## 2023-12-25 NOTE — Assessment & Plan Note (Signed)
 Right AKA stump with a probable pressure injury and some small ulceration.  Tissue somewhat indurated but no fluctuance, thus no drainable abscess.  Some serous appearing drainage.  SSTI is less likely.  Recommending offloading the stump, using crutches to move around without the prosthesis is much as possible for the next few days.  But given his recent history of MRSA SSTI I will send him home with linezolid  to be used in case the wound progresses despite offloading.  He will call back if this occurs or if he develops purulent drainage or increased swelling despite offloading, or fevers.

## 2023-12-25 NOTE — Patient Instructions (Signed)
 Start the linezolid  if the spot on the leg gets worse despite taking the pressure off of it. Use crutches at home for a little while.  If it starts to drain cloudy pus or get very swollen or you get fevers, call the clinic back.  Remember to bring all of the medications that you take (including over the counter medications and supplements) with you to every clinic visit.  This after visit summary is an important review of tests, referrals, and medication changes that were discussed during your visit. If you have questions or concerns, call [phone]. Outside of clinic business hours, call the main hospital at [phone] and ask the operator for the on-call internal medicine resident.   Ozell Kung MD 12/25/2023, 9:59 AM

## 2023-12-25 NOTE — Assessment & Plan Note (Addendum)
 Mild asymptomatic hypoglycemia today.  He took his Humalog  before leaving for his appointment and did not eat breakfast.  Snack of peanut butter crackers was provided and he was counseled that he could leave the clinic when blood sugar was >70 mg/dL by CGM.

## 2023-12-27 ENCOUNTER — Telehealth: Payer: Self-pay | Admitting: *Deleted

## 2023-12-27 NOTE — Progress Notes (Signed)
 Complex Care Management Note  Care Guide Note 12/27/2023 Name: Nathan [name] MRN: [id number] DOB: January 08, 1951  Nathan [name] is a 73 y.o. year old male who sees Nooruddin, Saad, MD for primary care. I reached out to Sherida JONETTA Batty by phone today to offer complex care management services.  [name] was given information about Complex Care Management services today including:   The Complex Care Management services include support from the care team which includes your Nurse Care Manager, Clinical Social Worker, or Pharmacist.  The Complex Care Management team is here to help remove barriers to the health concerns and goals most important to you. Complex Care Management services are voluntary, and the patient may decline or stop services at any time by request to their care team member.   Complex Care Management Consent Status: Patient agreed to services and verbal consent obtained.   Follow up plan:  Telephone appointment with complex care management team member scheduled for:  8/29  Encounter Outcome:  Patient Scheduled  Harlene Satterfield  [facility]  [facility], [facility] Guide  Direct Dial: [phone]  Fax [phone]

## 2023-12-27 NOTE — Progress Notes (Signed)
 Internal Medicine Clinic Attending  Case discussed with the resident at the time of the visit.  We reviewed the resident's history and exam and pertinent patient test results.  I agree with the assessment, diagnosis, and plan of care documented in the resident's note.

## 2024-01-04 ENCOUNTER — Telehealth: Payer: Medicare (Managed Care)

## 2024-01-08 ENCOUNTER — Telehealth: Payer: Self-pay | Admitting: *Deleted

## 2024-01-08 NOTE — Progress Notes (Unsigned)
 Complex Care Management Care Guide Note  01/08/2024 Name: Nathan [name] MRN: [id number] DOB: [birth year]  Nathan [name] is a 73 y.o. year old male who is a primary care patient of Nooruddin, Saad, MD and is actively engaged with the care management team. I reached out to Sherida JONETTA Batty by phone today to assist with re-scheduling  with the RN Case Manager.  Follow up plan: Unsuccessful telephone outreach attempt made. A HIPAA compliant phone message was left for the patient providing contact information and requesting a return call.  Harlene Satterfield  [facility]  [facility], [facility] Guide  Direct Dial: [phone]  Fax [phone]

## 2024-01-10 NOTE — Progress Notes (Signed)
 Complex Care Management Care Guide Note  01/10/2024 Name: Nathan [name] MRN: [id number] DOB: November 09, 1950  Nathan [name] is a 73 y.o. year old male who is a primary care patient of Nooruddin, Saad, MD and is actively engaged with the care management team. I reached out to Nathan [name] by phone today to assist with re-scheduling  with the RN Case Manager.  Follow up plan: Unsuccessful telephone outreach attempt made. A HIPAA compliant phone message was left for the patient providing contact information and requesting a return call. No further outreach attempts will be made at this time. We have been unable to contact the patient to reschedule for complex care management services.   Harlene Satterfield  [facility]  Value-Based Care Institute, [facility] Guide  Direct Dial: [phone]  Fax [phone]

## 2024-01-11 ENCOUNTER — Telehealth: Payer: Self-pay | Admitting: *Deleted

## 2024-01-11 NOTE — Progress Notes (Signed)
 Complex Care Management Care Guide Note  01/11/2024 Name: Nathan [name] MRN: [id number] DOB: March 17, 1951  Nathan [name] is a 73 y.o. year old male who is a primary care patient of Nooruddin, Saad, MD and is actively engaged with the care management team. I reached out to Nathan [name] by phone today to assist with re-scheduling  with the RN Case Manager.  Follow up plan: None Patient declines VBCI services No further outreach attempts will be made at this time.   Harlene Satterfield  [facility]  Value-Based Care Institute, [facility] Guide  Direct Dial: [phone]  Fax [phone]

## 2024-01-17 ENCOUNTER — Telehealth: Payer: Self-pay | Admitting: Dietician

## 2024-01-17 NOTE — Telephone Encounter (Signed)
 Call to patient about diabetes care, he states his wound is better. He states his blood sugars got better to where he had low blood sugars and then they began to rise again without him making any changes. He confirms he is taking the 10 mg Mounjaro  weekly and tolerating it, but did not know about the prescriptions for him to increase the dose. We reviewed the directions for his increasing Mounjaro  doses per Dr Norrine.  He verbalized understanding. Confirmed his Diabetes Self Management Education & Support follow up for 01/30/24.

## 2024-01-21 ENCOUNTER — Other Ambulatory Visit: Payer: Self-pay | Admitting: Student

## 2024-01-21 DIAGNOSIS — E1151 Type 2 diabetes mellitus with diabetic peripheral angiopathy without gangrene: Secondary | ICD-10-CM

## 2024-01-22 NOTE — Telephone Encounter (Signed)
 Medication last refilled 12/25/23, patient will be titrating doses. Mounjaro  12.5 mg is scheduled for pick up tomorrow.

## 2024-01-30 ENCOUNTER — Ambulatory Visit: Payer: Medicare (Managed Care) | Admitting: Dietician

## 2024-01-30 VITALS — Wt 186.2 lb

## 2024-01-30 DIAGNOSIS — E1151 Type 2 diabetes mellitus with diabetic peripheral angiopathy without gangrene: Secondary | ICD-10-CM

## 2024-01-30 DIAGNOSIS — Z794 Long term (current) use of insulin: Secondary | ICD-10-CM

## 2024-01-30 DIAGNOSIS — Z23 Encounter for immunization: Secondary | ICD-10-CM

## 2024-01-30 NOTE — Patient Instructions (Addendum)
 Thank you for your visit today!   I'll ask for a referral to a foot doctor to get diabetes shoes.   I'll ask for a prescription sent [facility] for Covid-19.  I recommend keeping the end of the freestyle libre sensor box and writing the date you put it on, to help you get a replacement from [employer].   Use a quick acting form of sugar (see handout) to treat low blood sugars- and keep by your bed side.    Keep taking the same diabetes meds like you are and I like that you are taking Humalog  for snacks!   We'll see you in October.  Arland [phone] (new number)

## 2024-01-31 MED ORDER — COVID-19 MRNA VAC-TRIS(PFIZER) 30 MCG/0.3ML IM SUSY: 0.3000 mL | PREFILLED_SYRINGE | Freq: Once | INTRAMUSCULAR | 0 refills | Status: AC

## 2024-01-31 NOTE — Addendum Note (Signed)
 Addended by: ELICIA SHARPER on: 01/31/2024 01:32 PM   Modules accepted: Orders

## 2024-01-31 NOTE — Progress Notes (Signed)
 BP Readings from Last 3 Encounters:  12/25/23 109/69  12/18/23 (!) 113/57  12/04/23 (!) 129/54   Lab Results  Component Value Date   HGBA1C 9.5 (H) 12/04/2023   HGBA1C 8.6 (A) 08/01/2023   HGBA1C 8.3 (A) 05/25/2023   HGBA1C 8.2 (A) 01/01/2023   HGBA1C 8.7 (A) 09/26/2022    Wt Readings from Last 10 Encounters:  01/31/24 186 lb 3.2 oz (84.5 kg)  12/25/23 184 lb 6.4 oz (83.6 kg)  12/18/23 185 lb (83.9 kg)  12/04/23 184 lb 9.6 oz (83.7 kg)  11/27/23 184 lb 14.4 oz (83.9 kg)  11/27/23 186 lb (84.4 kg)  11/22/23 184 lb (83.5 kg)  11/22/23 184 lb 6.4 oz (83.6 kg)  10/30/23 187 lb (84.8 kg)  08/29/23 185 lb (83.9 kg)   Diabetes Self-Management Education  Visit Type: Follow-up  Appt. Start Time: 115 Appt. End Time: 155  01/31/2024  [name], identified by name and date of birth, is a 73 y.o. male with a diagnosis of Diabetes:  Type 2  ASSESSMENT  Weight 186 lb 3.2 oz (84.5 kg). Body mass index is 24.57 kg/m.   CGM Results from download: 9/11 to 9/24  % Time CGM active:   94 %   (Goal >70%)  Average glucose:   204 mg/dL for 14 days  Glucose management indicator:   8.2 %  Time in range (70-180 mg/dL):   42 %   (Goal >29%)  Time High (181-250 mg/dL):   33 %   (Goal < 74%)  Time Very High (>250 mg/dL):    25 %   (Goal < 5%)  Time Low (54-69 mg/dL):   0 %   (Goal <5%)  Time Very Low (<54 mg/dL):   0 %   (Goal <8%)  Coefficient of variation:   33.3 %   (Goal <36%)   Looked back and this is fairly consistent over the past 2 months.    Diabetes Self-Management Education - 01/31/24 1100       Individualized Goals (developed by patient)   Medications take my medication as prescribed      Patient Self-Evaluation of Goals - Patient rates self as meeting previously set goals (% of time)   Medications >75% (most of the time)   he says 100%.         Individualized Plan for Diabetes Self-Management Training:   Learning Objective:  Patient will have a greater  understanding of diabetes self-management. Patient education plan is to attend individual and/or group sessions per assessed needs and concerns.   Plan:   Patient Instructions  Thank you for your visit today!   I'll ask for a referral to a foot doctor to get diabetes shoes.   I'll ask for a prescription sent [facility] for Covid-19.  I recommend keeping the end of the freestyle libre sensor box and writing the date you put it on, to help you get a replacement from [employer].   Use a quick acting form of sugar (see handout) to treat low blood sugars- and keep by your bed side.    Keep taking the same diabetes meds like you are and I like that you are taking Humalog  for snacks!   We'll see you in October.  Nathan [name] [phone] (new number)   Expected Outcomes:  Demonstrated interest in learning. Expect positive outcomes  Education material provided: Diabetes Resources  If problems or questions, patient to contact team via:  Phone  Future DSME appointment: 3-4 months [employer],  RD 01/31/2024 11:17 AM.

## 2024-02-03 ENCOUNTER — Other Ambulatory Visit: Payer: Self-pay | Admitting: Student

## 2024-02-03 DIAGNOSIS — I10 Essential (primary) hypertension: Secondary | ICD-10-CM

## 2024-02-04 NOTE — Telephone Encounter (Signed)
 Medication sent to pharmacy

## 2024-02-07 ENCOUNTER — Ambulatory Visit: Payer: Self-pay

## 2024-02-07 ENCOUNTER — Ambulatory Visit: Payer: Medicare (Managed Care) | Admitting: Student

## 2024-02-07 ENCOUNTER — Encounter: Payer: Self-pay | Admitting: Student

## 2024-02-07 VITALS — BP 118/75 | HR 91 | Temp 97.6°F | Ht 73.0 in | Wt 187.0 lb

## 2024-02-07 DIAGNOSIS — Z8614 Personal history of Methicillin resistant Staphylococcus aureus infection: Secondary | ICD-10-CM | POA: Diagnosis not present

## 2024-02-07 DIAGNOSIS — L03314 Cellulitis of groin: Secondary | ICD-10-CM

## 2024-02-07 DIAGNOSIS — L03818 Cellulitis of other sites: Secondary | ICD-10-CM

## 2024-02-07 MED ORDER — DOXYCYCLINE HYCLATE 100 MG PO TABS: 100.0000 mg | ORAL_TABLET | Freq: Two times a day (BID) | ORAL | 0 refills | Status: AC

## 2024-02-07 NOTE — Progress Notes (Addendum)
 CC: Acute visit  HPI: [name] is a 73 y.o. male living with a history stated below and presents today for acute visit. Please see problem based assessment and plan for additional details.  Past Medical History:  Diagnosis Date   Asthma    per 2003 [facility] pulm records pfts    Blood dyscrasia    HIV   Carotid artery occlusion    Clotting disorder    Colon polyps    noted previous colonoscopy [facility]   DDD (degenerative disc disease)    cervical spine   Depression    Diabetes mellitus    dx 2010   Diarrhea 02/24/2021   Dizziness 08/25/2021   Falls 08/25/2021   Fever    unknwon origin   GERD (gastroesophageal reflux disease)    Head swelling 05/23/2017   Hep C w/o coma, chronic (HCC)    History of syphilis    noted [facility] records   HIV infection (HCC)    undetectable viral load and CD4 ct 667 as of 11/2011   Hyperlipidemia    Hypertension    MRSA (methicillin resistant Staphylococcus aureus)    Multiple sclerosis    in remission as of 11/2011 (diagnosed late 1980s)   Pain in limb-Right Leg 10/13/2013   Pancreas (digestive gland) works poorly    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HCC)    2002   Pneumonia    Prosthesis fitting 03/10/2019   PVD (peripheral vascular disease)    Left Stent 07/02/2008   Scalp lesion 07/26/2017   UTI (urinary tract infection)    Wears dentures     Current Outpatient Medications on File Prior to Visit  Medication Sig Dispense Refill   acetaminophen  (TYLENOL ) 500 MG tablet Take 2 tablets (1,000 mg total) by mouth every 6 (six) hours. 30 tablet 0   atorvastatin  (LIPITOR) 40 MG tablet TAKE 1 TABLET(40 MG) BY MOUTH DAILY 90 tablet 1   bictegravir-emtricitabine -tenofovir  AF (BIKTARVY ) 50-200-25 MG TABS tablet Take 1 tablet by mouth daily. 30 tablet 11   Blood Glucose Monitoring Suppl (FREESTYLE LITE) w/Device KIT 1 Device by Does not apply route daily in the afternoon. 1 kit 0   cilostazol  (PLETAL ) 50 MG tablet TAKE 1 TABLET(50  MG) BY MOUTH TWICE DAILY 60 tablet 3   clopidogrel  (PLAVIX ) 75 MG tablet TAKE 1 TABLET(75 MG) BY MOUTH DAILY 90 tablet 3   clotrimazole  (LOTRIMIN ) 1 % cream Apply 1 Application topically 2 (two) times daily. 30 g 0   Continuous Glucose Sensor (FREESTYLE LIBRE 3 PLUS SENSOR) MISC Change sensor every 15 days. 6 each 3   empagliflozin  (JARDIANCE ) 25 MG TABS tablet Take 1 tablet (25 mg total) by mouth daily. 90 tablet 3   ezetimibe  (ZETIA ) 10 MG tablet TAKE 1 TABLET(10 MG) BY MOUTH DAILY 90 tablet 2   glucose blood (FREESTYLE LITE) test strip Use as instructed 100 each 12   glucose blood (FREESTYLE LITE) test strip Use 5 times daily to check blood sugar. 450 each 3   HUMALOG  100 UNIT/ML injection Use 35 units with breakfast and 30 units with your other main meals of the day     injection device for insulin  (CEQUR SIMPLICITY 2U) DEVI change every 2 days 45 each 3   insulin  glargine, 2 Unit Dial, (TOUJEO  MAX SOLOSTAR) 300 UNIT/ML Solostar Pen Inject 50 Units into the skin in the morning.     Insulin  Pen Needle (BD PEN NEEDLE NANO U/F) 32G X 4 MM MISC 1 Device by  Other route daily in the afternoon. USE AS DIRECTED FOUR TIMES DAILY. Dx code  E11.42 100 each 3   Insulin  Syringe-Needle U-100 (GNP INSULIN  SYRINGES) 30G X 5/16 1 ML MISC 1 Device by Does not apply route daily in the afternoon. 100 each 3   Lancet Devices (BAYER MICROLET 2 LANCING DEVIC) MISC Use to check blood sugar up to 3 ties a day 1 each 2   Lancets (FREESTYLE) lancets Use 5 times daily to check blood sugar. 450 each 3   linezolid  (ZYVOX ) 600 MG tablet Take 1 tablet (600 mg total) by mouth every 12 (twelve) hours for 6 days. 12 tablet 0   lipase/protease/amylase (CREON ) 36000 UNITS CPEP capsule TAKE 1 CAPSULE BY MOUTH THREE TIMES DAILY BEFORE A MEAL 200 capsule 3   lisinopril  (ZESTRIL ) 40 MG tablet TAKE 1 TABLET BY MOUTH DAILY. HOLD IF SYSTOLIC BLOOD PRESSURE IS LESS THAN 110 90 tablet 3   omeprazole  (PRILOSEC) 40 MG capsule TAKE 1  CAPSULE(40 MG) BY MOUTH DAILY 90 capsule 1   pioglitazone  (ACTOS ) 30 MG tablet TAKE 1 TABLET(30 MG) BY MOUTH DAILY 90 tablet 3   tirzepatide  (MOUNJARO ) 12.5 MG/0.5ML Pen Inject 12.5 mg into the skin once a week for 28 days. 2 mL 0   [START ON 02/21/2024] tirzepatide  (MOUNJARO ) 15 MG/0.5ML Pen Inject 15 mg into the skin once a week. 2 mL 3   No current facility-administered medications on file prior to visit.    Family History  Problem Relation Age of Onset   Diabetes Mother    Coronary artery disease Mother    Coronary artery disease Father    Liver disease Sister    Cancer Brother        colon caner stage 4 as of 11/2011 (unknown age of onset)   Prostate cancer Brother    Colon cancer Brother    Rectal cancer Neg Hx    Esophageal cancer Neg Hx     Social History   Socioeconomic History   Marital status: Single    Spouse name: Not on file   Number of children: 1   Years of education: 12   Highest education level: Not on file  Occupational History   Occupation: retired  Tobacco Use   Smoking status: Former    Current packs/day: 0.00    Types: Cigarettes    Quit date: 1967    Years since quitting: 58.7    Passive exposure: Never   Smokeless tobacco: Former    Types: Chew    Quit date: 1980  Vaping Use   Vaping status: Never Used  Substance and Sexual Activity   Alcohol use: No    Alcohol/week: 0.0 standard drinks of alcohol   Drug use: No   Sexual activity: Yes    Partners: Male    Comment: declined condoms  Other Topics Concern   Not on file  Social History Narrative   Not on file   Social Drivers of Health   Financial Resource Strain: Low Risk  (04/04/2022)   Overall Financial Resource Strain (CARDIA)    Difficulty of Paying Living Expenses: Not hard at all  Food Insecurity: No Food Insecurity (11/27/2023)   Hunger Vital Sign    Worried About Running Out of Food in the Last Year: Never true    Ran Out of Food in the Last Year: Never true  Transportation  Needs: No Transportation Needs (11/27/2023)   PRAPARE - Administrator, Civil Service (Medical): No  Lack of Transportation (Non-Medical): No  Physical Activity: Insufficiently Active (04/04/2022)   Exercise Vital Sign    Days of Exercise per Week: 1 day    Minutes of Exercise per Session: 20 min  Stress: No Stress Concern Present (04/04/2022)   [employer] of Occupational Health - Occupational Stress Questionnaire    Feeling of Stress : Not at all  Social Connections: Socially Isolated (11/27/2023)   Social Connection and Isolation Panel    Frequency of Communication with Friends and Family: Never    Frequency of Social Gatherings with Friends and Family: Once a week    Attends Religious Services: Never    Database administrator or Organizations: No    Attends Banker Meetings: Never    Marital Status: Divorced  Catering manager Violence: Not At Risk (11/27/2023)   Humiliation, Afraid, Rape, and Kick questionnaire    Fear of Current or Ex-Partner: No    Emotionally Abused: No    Physically Abused: No    Sexually Abused: No    Review of Systems: ROS negative except for what is noted on the assessment and plan.  Vitals:   02/07/24 0937 02/07/24 0952  BP: (!) 147/73 118/75  Pulse: 83 91  Temp: 97.6 F (36.4 C)   TempSrc: Oral   SpO2: 95%   Weight: 187 lb (84.8 kg)   Height: 6' 1 (1.854 m)    Physical Exam: Constitutional: well-appearing male sitting in chair, in no acute distress Cardiovascular: regular rate Pulmonary/Chest: normal work of breathing on room air Neurological: alert & oriented x 3 Skin: (photo below) few pustules over Left groin area w/ surrounding erythema and tenderness, firm/indurated, no fluctuance appreciated, no drainage or blood expressed     Assessment & Plan:   Assessment & Plan Cellulitis of other specified site Acute visit: CC: blisters. Reports noticing a pustule few weeks ago which has evolved and increased  in count over Left groin. Now a few pustules and endorses drainage. No bleeding. Denies fever, chills, n/v.  Reports similar to presentation in July on right elbow that had cellulitis and abscess s/p I&D. Cultures then grew MRSA and was treated w/ Linezolid . HIV controlled and RPR negative. Examined with Dr. Jeanelle and did not appreciate fluctuance. Will start w/ abx and close f/u. May need to consider imaging if worsening. Discussed precautions.   Plan -Warm compresses to area TID/QID -Start doxycycline  100 mg BID x 7 days  -Close f/u with 2 week OV -Return precautions discussed, worsening go to UC/ED  Return in about 2 weeks (around 02/21/2024) for infection check up.   Patient seen with Dr. Jeanelle Ozell Nearing, D.O. [facility] Internal Medicine, PGY-3 Phone: [phone] Date 02/07/2024 Time 8:21 PM

## 2024-02-07 NOTE — Patient Instructions (Addendum)
 Thank you, Nathan [name] for allowing us  to provide your care today. Today we discussed:  -Will start antibiotics for your groin infection.  --Doxycycline  100 mg twice a day for 7 days  -Follow up in 2 weeks to look at infection -If worsening symptoms or fevers, call our office or go to urgent care/emergency room    I have ordered the following medication/changed the following medications:  Start the following medications: Meds ordered this encounter  Medications   doxycycline  (VIBRA -TABS) 100 MG tablet    Sig: Take 1 tablet (100 mg total) by mouth 2 (two) times daily for 7 days.    Dispense:  14 tablet    Refill:  0     Follow up: 2 weeks   Should you have any questions or concerns please call the internal medicine clinic at [phone].    Katherine Tout, D.O. [facility]

## 2024-02-07 NOTE — Telephone Encounter (Signed)
 FYI Only or Action Required?: Action required by provider: request for appointment.  Patient was last seen in primary care on 12/25/2023 by Norrine Sharper, MD.  Called Nurse Triage reporting Boils.  Symptoms began a week ago.  Interventions attempted: Nothing.  Symptoms are: gradually worsening. 4-5 boild at groin, getting worse, draining.  Triage Disposition: No disposition on file.  Patient/caregiver understands and will follow disposition?:    Copied from CRM [id number]. Topic: Clinical - Red Word Triage >> Feb 07, 2024  8:31 AM Zane F wrote: [facility] that prompted transfer to Nurse Triage:   Concern: four to five boils popped on his pubic hair line; MRSA  Symptoms:   painful  Ooze liquid  Redness  When did the symptoms start?: 1 week ago   What have you done to aid in the concern ? Have you taken anything to assist with the matter?: No  If so, what did you take?:     Wanted to let you know I will be transferring you to further discuss your concern. Please be advised the nurse can assist with scheduling. Answer Assessment - Initial Assessment Questions 1. APPEARANCE of BOIL: What does the boil look like?      bumps 2. LOCATION: Where is the boil located?      groin 3. NUMBER: How many boils are there?      5 4. SIZE: How big is the boil? (e.g., inches, cm; compare to size of a coin or other object)     large 5. ONSET: When did the boil start?     weeks 6. PAIN: Is there any pain? If Yes, ask: How bad is the pain?   (Scale 1-10; or mild, moderate, severe)     yes 7. FEVER: Do you have a fever? If Yes, ask: What is it, how was it measured, and when did it start?      no 8. SOURCE: Have you been around anyone with boils or other Staph infections? Have you ever had boils before?     no 9. OTHER SYMPTOMS: Do you have any other symptoms? (e.g., shaking chills, weakness, rash elsewhere on body)     no 10. PREGNANCY: Is there any  chance you are pregnant? When was your last menstrual period?       N/a  Protocols used: Boil (Skin Abscess)-A-AH

## 2024-02-11 NOTE — Progress Notes (Signed)
 Internal Medicine Clinic Attending  I was physically present during the key portions of the resident provided service and participated in the medical decision making of patient's management care. I reviewed pertinent patient test results.  The assessment, diagnosis, and plan were formulated together and I agree with the documentation in the resident's note.  Jeanelle Layman CROME, MD

## 2024-02-21 ENCOUNTER — Encounter: Payer: Self-pay | Admitting: Student

## 2024-02-21 ENCOUNTER — Ambulatory Visit: Payer: Medicare (Managed Care) | Admitting: Student

## 2024-02-21 VITALS — BP 137/55 | HR 73 | Temp 97.4°F | Ht 73.0 in | Wt 187.8 lb

## 2024-02-21 DIAGNOSIS — E1151 Type 2 diabetes mellitus with diabetic peripheral angiopathy without gangrene: Secondary | ICD-10-CM

## 2024-02-21 DIAGNOSIS — Z872 Personal history of diseases of the skin and subcutaneous tissue: Secondary | ICD-10-CM

## 2024-02-21 DIAGNOSIS — Z794 Long term (current) use of insulin: Secondary | ICD-10-CM

## 2024-02-21 DIAGNOSIS — Z79899 Other long term (current) drug therapy: Secondary | ICD-10-CM | POA: Diagnosis not present

## 2024-02-21 DIAGNOSIS — E11628 Type 2 diabetes mellitus with other skin complications: Secondary | ICD-10-CM

## 2024-02-21 DIAGNOSIS — L03818 Cellulitis of other sites: Secondary | ICD-10-CM | POA: Diagnosis not present

## 2024-02-21 DIAGNOSIS — Z833 Family history of diabetes mellitus: Secondary | ICD-10-CM

## 2024-02-21 DIAGNOSIS — Z8614 Personal history of Methicillin resistant Staphylococcus aureus infection: Secondary | ICD-10-CM | POA: Diagnosis not present

## 2024-02-21 DIAGNOSIS — Z87891 Personal history of nicotine dependence: Secondary | ICD-10-CM | POA: Diagnosis not present

## 2024-02-21 DIAGNOSIS — Z8249 Family history of ischemic heart disease and other diseases of the circulatory system: Secondary | ICD-10-CM

## 2024-02-21 MED ORDER — DOXYCYCLINE HYCLATE 100 MG PO TABS: 100.0000 mg | ORAL_TABLET | Freq: Two times a day (BID) | ORAL | 0 refills | Status: AC

## 2024-02-21 NOTE — Progress Notes (Unsigned)
 CC: 2 week f/u  HPI: [name] is a 73 y.o. male living with a history stated below and presents today for 2 week f/u. Please see problem based assessment and plan for additional details.  Past Medical History:  Diagnosis Date   Asthma    per 2003 [facility] pulm records pfts    Blood dyscrasia    HIV   Carotid artery occlusion    Clotting disorder    Colon polyps    noted previous colonoscopy [facility]   DDD (degenerative disc disease)    cervical spine   Depression    Diabetes mellitus    dx 2010   Diarrhea 02/24/2021   Dizziness 08/25/2021   Falls 08/25/2021   Fever    unknwon origin   GERD (gastroesophageal reflux disease)    Head swelling 05/23/2017   Hep C w/o coma, chronic (HCC)    History of syphilis    noted [facility] records   HIV infection (HCC)    undetectable viral load and CD4 ct 667 as of 11/2011   Hyperlipidemia    Hypertension    MRSA (methicillin resistant Staphylococcus aureus)    Multiple sclerosis    in remission as of 11/2011 (diagnosed late 1980s)   Pain in limb-Right Leg 10/13/2013   Pancreas (digestive gland) works poorly    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HCC)    2002   Pneumonia    Prosthesis fitting 03/10/2019   PVD (peripheral vascular disease)    Left Stent 07/02/2008   Scalp lesion 07/26/2017   UTI (urinary tract infection)    Wears dentures     Current Outpatient Medications on File Prior to Visit  Medication Sig Dispense Refill   acetaminophen  (TYLENOL ) 500 MG tablet Take 2 tablets (1,000 mg total) by mouth every 6 (six) hours. 30 tablet 0   atorvastatin  (LIPITOR) 40 MG tablet TAKE 1 TABLET(40 MG) BY MOUTH DAILY 90 tablet 1   bictegravir-emtricitabine -tenofovir  AF (BIKTARVY ) 50-200-25 MG TABS tablet Take 1 tablet by mouth daily. 30 tablet 11   Blood Glucose Monitoring Suppl (FREESTYLE LITE) w/Device KIT 1 Device by Does not apply route daily in the afternoon. 1 kit 0   cilostazol  (PLETAL ) 50 MG tablet TAKE 1 TABLET(50 MG) BY  MOUTH TWICE DAILY 60 tablet 3   clopidogrel  (PLAVIX ) 75 MG tablet TAKE 1 TABLET(75 MG) BY MOUTH DAILY 90 tablet 3   clotrimazole  (LOTRIMIN ) 1 % cream Apply 1 Application topically 2 (two) times daily. 30 g 0   Continuous Glucose Sensor (FREESTYLE LIBRE 3 PLUS SENSOR) MISC Change sensor every 15 days. 6 each 3   empagliflozin  (JARDIANCE ) 25 MG TABS tablet Take 1 tablet (25 mg total) by mouth daily. 90 tablet 3   glucose blood (FREESTYLE LITE) test strip Use as instructed 100 each 12   glucose blood (FREESTYLE LITE) test strip Use 5 times daily to check blood sugar. 450 each 3   HUMALOG  100 UNIT/ML injection Use 35 units with breakfast and 30 units with your other main meals of the day     injection device for insulin  (CEQUR SIMPLICITY 2U) DEVI change every 2 days 45 each 3   insulin  glargine, 2 Unit Dial, (TOUJEO  MAX SOLOSTAR) 300 UNIT/ML Solostar Pen Inject 50 Units into the skin in the morning.     Insulin  Pen Needle (BD PEN NEEDLE NANO U/F) 32G X 4 MM MISC 1 Device by Other route daily in the afternoon. USE AS DIRECTED FOUR TIMES DAILY. Dx code  [phone]  100 each 3   Insulin  Syringe-Needle U-100 (GNP INSULIN  SYRINGES) 30G X 5/16 1 ML MISC 1 Device by Does not apply route daily in the afternoon. 100 each 3   Lancet Devices (BAYER MICROLET 2 LANCING DEVIC) MISC Use to check blood sugar up to 3 ties a day 1 each 2   Lancets (FREESTYLE) lancets Use 5 times daily to check blood sugar. 450 each 3   linezolid  (ZYVOX ) 600 MG tablet Take 1 tablet (600 mg total) by mouth every 12 (twelve) hours for 6 days. 12 tablet 0   lipase/protease/amylase (CREON ) 36000 UNITS CPEP capsule TAKE 1 CAPSULE BY MOUTH THREE TIMES DAILY BEFORE A MEAL 200 capsule 3   lisinopril  (ZESTRIL ) 40 MG tablet TAKE 1 TABLET BY MOUTH DAILY. HOLD IF SYSTOLIC BLOOD PRESSURE IS LESS THAN 110 90 tablet 3   pioglitazone  (ACTOS ) 30 MG tablet TAKE 1 TABLET(30 MG) BY MOUTH DAILY 90 tablet 3   tirzepatide  (MOUNJARO ) 15 MG/0.5ML Pen Inject 15 mg  into the skin once a week. 2 mL 3   No current facility-administered medications on file prior to visit.    Family History  Problem Relation Age of Onset   Diabetes Mother    Coronary artery disease Mother    Coronary artery disease Father    Liver disease Sister    Cancer Brother        colon caner stage 4 as of 11/2011 (unknown age of onset)   Prostate cancer Brother    Colon cancer Brother    Rectal cancer Neg Hx    Esophageal cancer Neg Hx     Social History   Socioeconomic History   Marital status: Single    Spouse name: Not on file   Number of children: 1   Years of education: 12   Highest education level: Not on file  Occupational History   Occupation: retired  Tobacco Use   Smoking status: Former    Current packs/day: 0.00    Types: Cigarettes    Quit date: 1967    Years since quitting: 58.8    Passive exposure: Never   Smokeless tobacco: Former    Types: Chew    Quit date: 1980  Vaping Use   Vaping status: Never Used  Substance and Sexual Activity   Alcohol use: No    Alcohol/week: 0.0 standard drinks of alcohol   Drug use: No   Sexual activity: Yes    Partners: Male    Comment: declined condoms  Other Topics Concern   Not on file  Social History Narrative   Not on file   Social Drivers of Health   Financial Resource Strain: Low Risk  (04/04/2022)   Overall Financial Resource Strain (CARDIA)    Difficulty of Paying Living Expenses: Not hard at all  Food Insecurity: No Food Insecurity (11/27/2023)   Hunger Vital Sign    Worried About Running Out of Food in the Last Year: Never true    Ran Out of Food in the Last Year: Never true  Transportation Needs: No Transportation Needs (11/27/2023)   PRAPARE - Administrator, Civil Service (Medical): No    Lack of Transportation (Non-Medical): No  Physical Activity: Insufficiently Active (04/04/2022)   Exercise Vital Sign    Days of Exercise per Week: 1 day    Minutes of Exercise per Session:  20 min  Stress: No Stress Concern Present (04/04/2022)   [employer] of Occupational Health - Occupational Stress Questionnaire  Feeling of Stress : Not at all  Social Connections: Socially Isolated (11/27/2023)   Social Connection and Isolation Panel    Frequency of Communication with Friends and Family: Never    Frequency of Social Gatherings with Friends and Family: Once a week    Attends Religious Services: Never    Database administrator or Organizations: No    Attends Banker Meetings: Never    Marital Status: Divorced  Catering manager Violence: Not At Risk (11/27/2023)   Humiliation, Afraid, Rape, and Kick questionnaire    Fear of Current or Ex-Partner: No    Emotionally Abused: No    Physically Abused: No    Sexually Abused: No    Review of Systems: ROS negative except for what is noted on the assessment and plan.  Vitals:   02/21/24 0936  BP: (!) 137/55  Pulse: 73  Temp: (!) 97.4 F (36.3 C)  TempSrc: Oral  SpO2: 98%  Weight: 187 lb 12.8 oz (85.2 kg)  Height: 6' 1 (1.854 m)   Physical Exam: Constitutional: well-appearing male sitting in chair, in no acute distress Cardiovascular: regular rate  Pulmonary/Chest: normal work of breathing on room air Neurological: alert & oriented x 3 Skin: see photo below, resolving lesions and erythema of the left pelvic/groin area, noted new firm lesions under right armpit w/o fluctuance or drainage        Assessment & Plan:   Assessment & Plan Cellulitis of other specified site 2 week f/u visit for pustules w/ surrounding cellulitis of L pelvic/groin area. Completed doxycycline  x 7 days (~02/15/24) in setting of prior hx of MRSA SSTI. Prior lesions appeared to be healing/resolving but noted new lesions under R armpit. Prior notes at ID noted possibly decolonization methods if recurrence. HIV controlled w/ CD4 count 691 in 11/2023. Negative RPR. Denies any systemic sick symptoms at this time and VSS,  afebrile.   Plan -Repeat doxycycline  100 mg BID x 7 days, f/u in 2-4 weeks  -Encourage f/u with [facility] about decolonization options -Discussed antimicrobial soaps available OTC Type 2 diabetes mellitus with diabetic peripheral angiopathy without gangrene, with long-term current use of insulin  (HCC) Follows with [facility]. Last A1c 9.5 in 12/04/2023. Regimen prescribed appears Toujeo  50 units daily, Humalog  30 units TID AC, Actos  30 mg, Jardiance  25 mg and Mounjaro  15 mg. Did not bring meter/CGM today. Reports fasting glucose 115-120, w/ minimal hypoglycemia. Overdue for f/u with endo, encouraged patient to schedule f/u soon, patient aware.   Return in about 4 weeks (around 03/20/2024) for skin infection.   Patient discussed with Dr. Jeanelle Ozell Nearing, D.O. [facility], PGY-3 Phone: [phone] Date 02/21/2024 Time 9:04 AM

## 2024-02-21 NOTE — Patient Instructions (Addendum)
 Thank you, Mr.Stephens D Strong for allowing us  to provide your care today. Today we discussed:  -Doxycycline  100 mg twice a day for 7 days -Can try Lever 2000 soap that is over-the-counter  -Reach out to your ID doctor to discuss decolonization options given recurrent skin infections.   -Reach out to your endocrinologist to schedule follow up.    I have ordered the following medication/changed the following medications:  Start the following medications: Meds ordered this encounter  Medications   doxycycline  (VIBRA -TABS) 100 MG tablet    Sig: Take 1 tablet (100 mg total) by mouth 2 (two) times daily for 7 days.    Dispense:  14 tablet    Refill:  0    Follow up: 2-4 weeks    Should you have any questions or concerns please call the internal medicine clinic at [phone].    Ladeana Laplant, D.O. [facility]

## 2024-02-22 ENCOUNTER — Other Ambulatory Visit: Payer: Self-pay | Admitting: Student

## 2024-02-22 DIAGNOSIS — K219 Gastro-esophageal reflux disease without esophagitis: Secondary | ICD-10-CM

## 2024-02-22 DIAGNOSIS — E1169 Type 2 diabetes mellitus with other specified complication: Secondary | ICD-10-CM

## 2024-02-22 DIAGNOSIS — L0390 Cellulitis, unspecified: Secondary | ICD-10-CM | POA: Insufficient documentation

## 2024-02-22 NOTE — Assessment & Plan Note (Signed)
 2 week f/u visit for pustules w/ surrounding cellulitis of L pelvic/groin area. Completed doxycycline  x 7 days (~02/15/24).

## 2024-02-22 NOTE — Telephone Encounter (Signed)
 Medication sent to pharmacy

## 2024-02-23 ENCOUNTER — Encounter: Payer: Self-pay | Admitting: Student

## 2024-02-23 NOTE — Assessment & Plan Note (Signed)
 Follows with [facility]. Last A1c 9.5 in 12/04/2023. Regimen prescribed appears Toujeo  50 units daily, Humalog  30 units TID AC, Actos  30 mg, Jardiance  25 mg and Mounjaro  15 mg. Did not bring meter/CGM today. Reports fasting glucose 115-120, w/ minimal hypoglycemia. Overdue for f/u with endo, encouraged patient to schedule f/u soon, patient aware.

## 2024-02-24 ENCOUNTER — Other Ambulatory Visit: Payer: Self-pay | Admitting: Student

## 2024-02-24 DIAGNOSIS — I739 Peripheral vascular disease, unspecified: Secondary | ICD-10-CM

## 2024-02-24 NOTE — Progress Notes (Signed)
 Internal Medicine Clinic Attending  Case discussed with the resident at the time of the visit.  We reviewed the resident's history and exam and pertinent patient test results.  I agree with the assessment, diagnosis, and plan of care documented in the resident's note.

## 2024-02-25 NOTE — Telephone Encounter (Signed)
 Pharmacy is requesting a 90 day supply

## 2024-02-28 ENCOUNTER — Telehealth: Payer: Self-pay | Admitting: Dietician

## 2024-02-28 NOTE — Telephone Encounter (Signed)
 Got his covid vaccine after we sent the prescription to [facility]. Referral follow up for dm shoes; patient to call his insurance about who is Actor for diabetic shoes and let us  know.  He states his boils are not as improved as he would like after almost finishing his antibiotic. He intends to call Dr. Fleeta Rothman next week.

## 2024-03-03 ENCOUNTER — Ambulatory Visit (HOSPITAL_BASED_OUTPATIENT_CLINIC_OR_DEPARTMENT_OTHER)
Admission: RE | Admit: 2024-03-03 | Discharge: 2024-03-03 | Disposition: A | Discharge status: Home / Self Care | Payer: Medicare (Managed Care) | Source: Ambulatory Visit | Attending: Surgery | Admitting: Surgery

## 2024-03-03 ENCOUNTER — Ambulatory Visit: Payer: Medicare (Managed Care) | Admitting: Physician Assistant

## 2024-03-03 ENCOUNTER — Ambulatory Visit (HOSPITAL_COMMUNITY)
Admission: RE | Admit: 2024-03-03 | Discharge: 2024-03-03 | Disposition: A | Discharge status: Home / Self Care | Payer: Medicare (Managed Care) | Source: Ambulatory Visit | Attending: Surgery | Admitting: Surgery

## 2024-03-03 VITALS — BP 166/74 | HR 66 | Temp 97.7°F | Wt 188.5 lb

## 2024-03-03 DIAGNOSIS — I739 Peripheral vascular disease, unspecified: Secondary | ICD-10-CM

## 2024-03-03 DIAGNOSIS — I6523 Occlusion and stenosis of bilateral carotid arteries: Secondary | ICD-10-CM | POA: Insufficient documentation

## 2024-03-03 LAB — VAS US ABI WITH/WO TBI: Left ABI: 0.49

## 2024-03-03 NOTE — Progress Notes (Signed)
 Office Note     CC:  follow up Requesting Provider:  Nooruddin, Saad, MD  HPI: Nathan [name] is a 73 y.o. (May 05, 1951) male who presents for routine follow up of carotid artery stenosis and PAD. He has recent history of left CEA by Dr. Serene on 02/22/23 for asymptomatic high grade stenosis. He has no history of TIA or stroke. He also has a remote history of right AKA many years ago. He has previously undergone left popliteal artery stenting in 2019. He has had no issues with claudication, rest pain or tissue loss of the LLE.   Today he reports that he has had increased discomfort in his left leg since his last visit. This is mostly bothersome at night. He describes it as a tingling pain mostly in lower left leg but also occurs in the thigh.He says it is a tolerable discomfort but more of an annoyance and disruptive to his sleep. He says it may occur during the day but it is not overly noticeable to him. He tries to remain very active. He does not have any pain at rest. No pain on ambulation. No tissue loss. He uses a right AKA prosthesis to ambulate. He does report having some issues with his prosthesis. He says he has not been to see [facility] in several years.   He denies any visual changes, slurred speech, facial drooping, unilateral upper or lower extremity weakness or numbness.  He is medically managed on Aspirin , Statin, Pletal  and Plavix .   Past Medical History:  Diagnosis Date   Asthma    per 2003 [facility] pulm records pfts    Blood dyscrasia    HIV   Carotid artery occlusion    Clotting disorder    Colon polyps    noted previous colonoscopy [facility]   DDD (degenerative disc disease)    cervical spine   Depression    Diabetes mellitus    dx 2010   Diarrhea 02/24/2021   Dizziness 08/25/2021   Falls 08/25/2021   Fever    unknwon origin   GERD (gastroesophageal reflux disease)    Head swelling 05/23/2017   Hep C w/o coma, chronic (HCC)    History of syphilis    noted [facility]  records   HIV infection (HCC)    undetectable viral load and CD4 ct 667 as of 11/2011   Hyperlipidemia    Hypertension    MRSA (methicillin resistant Staphylococcus aureus)    Multiple sclerosis    in remission as of 11/2011 (diagnosed late 1980s)   Pain in limb-Right Leg 10/13/2013   Pancreas (digestive gland) works poorly    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HCC)    2002   Pneumonia    Prosthesis fitting 03/10/2019   PVD (peripheral vascular disease)    Left Stent 07/02/2008   Scalp lesion 07/26/2017   UTI (urinary tract infection)    Wears dentures     Past Surgical History:  Procedure Laterality Date   ABDOMINAL AORTOGRAM W/LOWER EXTREMITY N/A 08/07/2017   Procedure: ABDOMINAL AORTOGRAM W/LOWER EXTREMITY;  Surgeon: Serene Gaile ORN, MD;  Location: [facility];  Service: Cardiovascular;  Laterality: N/A;   ABOVE KNEE LEG AMPUTATION  2009   Right Leg   COLONOSCOPY W/ BIOPSIES AND POLYPECTOMY     ENDARTERECTOMY Left 02/22/2023   Procedure: LEFT ENDARTERECTOMY CAROTID;  Surgeon: Serene Gaile ORN, MD;  Location: [facility] OR;  Service: Vascular;  Laterality: Left;   LOWER EXTREMITY ANGIOGRAM Left 12/26/2011   Procedure: LOWER EXTREMITY  ANGIOGRAM;  Surgeon: Gaile LELON New, MD;  Location: [facility] CATH LAB;  Service: Cardiovascular;  Laterality: Left;   LOWER EXTREMITY ANGIOGRAM Left 08/17/2017   Procedure: LOWER EXTREMITY ANGIOGRAM LEFT LEG WITH RUNOFF AND Stenting.;  Surgeon: New Gaile LELON, MD;  Location: [facility] OR;  Service: Vascular;  Laterality: Left;   MULTIPLE TOOTH EXTRACTIONS     OTHER SURGICAL HISTORY     left left with stents (Dr. Celso)   OTHER SURGICAL HISTORY     2003 colonoscopy 5 mm polyp transverse colon; (2)28mm  polyps in rectum-hyperplastic   PATCH ANGIOPLASTY Left 02/22/2023   Procedure: PATCH ANGIOPLASTY 1CMx14CM GEORGE LISLE;  Surgeon: New Gaile LELON, MD;  Location: [facility] OR;  Service: Vascular;  Laterality: Left;   PERIPHERAL VASCULAR INTERVENTION  Left 08/17/2017   Procedure: POPLITEAL STENT;  Surgeon: New Gaile LELON, MD;  Location: [facility] OR;  Service: Vascular;  Laterality: Left;    Social History   Socioeconomic History   Marital status: Single    Spouse name: Not on file   Number of children: 1   Years of education: 12   Highest education level: Not on file  Occupational History   Occupation: retired  Tobacco Use   Smoking status: Former    Current packs/day: 0.00    Types: Cigarettes    Quit date: 1967    Years since quitting: 58.8    Passive exposure: Never   Smokeless tobacco: Former    Types: Chew    Quit date: 1980  Vaping Use   Vaping status: Never Used  Substance and Sexual Activity   Alcohol use: No    Alcohol/week: 0.0 standard drinks of alcohol   Drug use: No   Sexual activity: Yes    Partners: Male    Comment: declined condoms  Other Topics Concern   Not on file  Social History Narrative   Not on file   Social Drivers of Health   Financial Resource Strain: Low Risk  (04/04/2022)   Overall Financial Resource Strain (CARDIA)    Difficulty of Paying Living Expenses: Not hard at all  Food Insecurity: No Food Insecurity (11/27/2023)   Hunger Vital Sign    Worried About Running Out of Food in the Last Year: Never true    Ran Out of Food in the Last Year: Never true  Transportation Needs: No Transportation Needs (11/27/2023)   PRAPARE - Administrator, Civil Service (Medical): No    Lack of Transportation (Non-Medical): No  Physical Activity: Insufficiently Active (04/04/2022)   Exercise Vital Sign    Days of Exercise per Week: 1 day    Minutes of Exercise per Session: 20 min  Stress: No Stress Concern Present (04/04/2022)   [employer] of Occupational Health - Occupational Stress Questionnaire    Feeling of Stress : Not at all  Social Connections: Socially Isolated (11/27/2023)   Social Connection and Isolation Panel    Frequency of Communication with Friends and Family: Never     Frequency of Social Gatherings with Friends and Family: Once a week    Attends Religious Services: Never    Database Administrator or Organizations: No    Attends Banker Meetings: Never    Marital Status: Divorced  Catering Manager Violence: Not At Risk (11/27/2023)   Humiliation, Afraid, Rape, and Kick questionnaire    Fear of Current or Ex-Partner: No    Emotionally Abused: No    Physically Abused: No    Sexually Abused: No  Family History  Problem Relation Age of Onset   Diabetes Mother    Coronary artery disease Mother    Coronary artery disease Father    Liver disease Sister    Cancer Brother        colon caner stage 4 as of 11/2011 (unknown age of onset)   Prostate cancer Brother    Colon cancer Brother    Rectal cancer Neg Hx    Esophageal cancer Neg Hx     Current Outpatient Medications  Medication Sig Dispense Refill   ezetimibe  (ZETIA ) 10 MG tablet TAKE 1 TABLET(10 MG) BY MOUTH DAILY 90 tablet 2   omeprazole  (PRILOSEC) 40 MG capsule TAKE 1 CAPSULE(40 MG) BY MOUTH DAILY 90 capsule 1   acetaminophen  (TYLENOL ) 500 MG tablet Take 2 tablets (1,000 mg total) by mouth every 6 (six) hours. 30 tablet 0   atorvastatin  (LIPITOR) 40 MG tablet TAKE 1 TABLET(40 MG) BY MOUTH DAILY 90 tablet 1   bictegravir-emtricitabine -tenofovir  AF (BIKTARVY ) 50-200-25 MG TABS tablet Take 1 tablet by mouth daily. 30 tablet 11   Blood Glucose Monitoring Suppl (FREESTYLE LITE) w/Device KIT 1 Device by Does not apply route daily in the afternoon. 1 kit 0   cilostazol  (PLETAL ) 50 MG tablet TAKE 1 TABLET(50 MG) BY MOUTH TWICE DAILY 180 tablet 3   clopidogrel  (PLAVIX ) 75 MG tablet TAKE 1 TABLET(75 MG) BY MOUTH DAILY 90 tablet 3   clotrimazole  (LOTRIMIN ) 1 % cream Apply 1 Application topically 2 (two) times daily. 30 g 0   Continuous Glucose Sensor (FREESTYLE LIBRE 3 PLUS SENSOR) MISC Change sensor every 15 days. 6 each 3   empagliflozin  (JARDIANCE ) 25 MG TABS tablet Take 1 tablet (25 mg  total) by mouth daily. 90 tablet 3   glucose blood (FREESTYLE LITE) test strip Use as instructed 100 each 12   glucose blood (FREESTYLE LITE) test strip Use 5 times daily to check blood sugar. 450 each 3   HUMALOG  100 UNIT/ML injection Use 35 units with breakfast and 30 units with your other main meals of the day     injection device for insulin  (CEQUR SIMPLICITY 2U) DEVI change every 2 days 45 each 3   insulin  glargine, 2 Unit Dial, (TOUJEO  MAX SOLOSTAR) 300 UNIT/ML Solostar Pen Inject 50 Units into the skin in the morning.     Insulin  Pen Needle (BD PEN NEEDLE NANO U/F) 32G X 4 MM MISC 1 Device by Other route daily in the afternoon. USE AS DIRECTED FOUR TIMES DAILY. Dx code  E11.42 100 each 3   Insulin  Syringe-Needle U-100 (GNP INSULIN  SYRINGES) 30G X 5/16 1 ML MISC 1 Device by Does not apply route daily in the afternoon. 100 each 3   Lancet Devices (BAYER MICROLET 2 LANCING DEVIC) MISC Use to check blood sugar up to 3 ties a day 1 each 2   Lancets (FREESTYLE) lancets Use 5 times daily to check blood sugar. 450 each 3   linezolid  (ZYVOX ) 600 MG tablet Take 1 tablet (600 mg total) by mouth every 12 (twelve) hours for 6 days. 12 tablet 0   lipase/protease/amylase (CREON ) 36000 UNITS CPEP capsule TAKE 1 CAPSULE BY MOUTH THREE TIMES DAILY BEFORE A MEAL 200 capsule 3   lisinopril  (ZESTRIL ) 40 MG tablet TAKE 1 TABLET BY MOUTH DAILY. HOLD IF SYSTOLIC BLOOD PRESSURE IS LESS THAN 110 90 tablet 3   pioglitazone  (ACTOS ) 30 MG tablet TAKE 1 TABLET(30 MG) BY MOUTH DAILY 90 tablet 3   tirzepatide  (MOUNJARO ) 15 MG/0.5ML Pen  Inject 15 mg into the skin once a week. 2 mL 3   No current facility-administered medications for this visit.    Allergies  Allergen Reactions   Sulfonamide Derivatives Hives     REVIEW OF SYSTEMS:  Negative unless noted in HPI [X]  denotes positive finding, [ ]  denotes negative finding Cardiac  Comments:  Chest pain or chest pressure:    Shortness of breath upon exertion:     Short of breath when lying flat:    Irregular heart rhythm:        Vascular    Pain in calf, thigh, or hip brought on by ambulation:    Pain in feet at night that wakes you up from your sleep:     Blood clot in your veins:    Leg swelling:         Pulmonary    Oxygen at home:    Productive cough:     Wheezing:         Neurologic    Sudden weakness in arms or legs:     Sudden numbness in arms or legs:     Sudden onset of difficulty speaking or slurred speech:    Temporary loss of vision in one eye:     Problems with dizziness:         Gastrointestinal    Blood in stool:     Vomited blood:         Genitourinary    Burning when urinating:     Blood in urine:        Psychiatric    Major depression:         Hematologic    Bleeding problems:    Problems with blood clotting too easily:        Skin    Rashes or ulcers:        Constitutional    Fever or chills:      PHYSICAL EXAMINATION:  Vitals:   03/03/24 1256 03/03/24 1259  BP: (!) 167/70 (!) 166/74  Pulse: 67 66  Temp: 97.7 F (36.5 C)    General:  WDWN in NAD; vital signs documented above Gait: Normal, uses right AKA prosthesis HENT: WNL, normocephalic Pulmonary: normal non-labored breathing Cardiac: regular HR Abdomen: soft Vascular Exam/Pulses: 2+ radial, 2+ femoral, no distal pulses palpable on LLE. Foot warm and well perfused Extremities: without ischemic changes, without Gangrene , without cellulitis; without open wounds;  Musculoskeletal: no muscle wasting or atrophy  Neurologic: A&O X 3 Psychiatric:  The pt has Normal affect.   Non-Invasive Vascular Imaging:   VAS US  Carotid Duplex: Summary:  Right Carotid: Velocities in the right ICA are consistent with a 40-59% stenosis, high-end of range. Non-hemodynamically significant plaque <50% noted in the CCA. The ECA appears >50% stenosed.   Left Carotid: Velocities in the left ICA are consistent with a 1-39% stenosis. The ECA appears >50% stenosed.    Vertebrals:  Bilateral vertebral arteries demonstrate antegrade flow.  Subclavians: Right subclavian artery was stenotic. Normal flow hemodynamics were seen in the left subclavian artery.   +-------+-----------+-----------+------------+------------+  ABI/TBIToday's ABIToday's TBIPrevious ABIPrevious TBI  +-------+-----------+-----------+------------+------------+  Right AKA                   AKA                       +-------+-----------+-----------+------------+------------+  Left  0.49       0.44       0.74  0.29          +-------+-----------+-----------+------------+------------+    VAS US  Lower extremity arterial duplex: Summary:  Left: 50-74% stenosis noted in the mid superficial femoral artery. 50-74% stenosis noted in the superficial femoral artery and/or AK popliteal artery. Patent popliteal artery stent with <50% restenosis.    ASSESSMENT/PLAN:: 73 y.o. male here for routine follow up of carotid artery stenosis and PAD. He has recent history of left CEA by Dr. Serene on 02/22/23 for asymptomatic high grade stenosis. He has no history of TIA or stroke. He also has a remote history of right AKA many years ago. He has previously undergone left popliteal artery stenting in 2019. He has started to have some pain in the left leg at night. Not clearly rest pain but maybe the start. He does not describe any claudication and he has no wounds.  - ABI has decreased significantly from prior visit - Duplex shows increased areas of stenosis and elevated velocities compared to prior duplex - I discussed with his increased symptoms and also worsening numbers on his non invasive studies we could continue his current medical management vs. Aortogram, Arteriogram of the LLE. He would like to hold off on having any procedure at this time - We discussed that should he have worsening symptoms to call for earlier follow up - Continue Statin, Plavix , Cilostazol  - He will follow up  again in 3 months with LLE arterial duplex and ABI. Carotid duplex will be due again in 6 months   Teretha Damme, PA-C [facility] [phone]  Clinic MD:   Serene

## 2024-03-04 ENCOUNTER — Other Ambulatory Visit: Payer: Self-pay

## 2024-03-04 DIAGNOSIS — I70213 Atherosclerosis of native arteries of extremities with intermittent claudication, bilateral legs: Secondary | ICD-10-CM

## 2024-03-05 ENCOUNTER — Telehealth: Payer: Self-pay

## 2024-03-05 ENCOUNTER — Other Ambulatory Visit: Payer: Self-pay

## 2024-03-05 ENCOUNTER — Ambulatory Visit (INDEPENDENT_AMBULATORY_CARE_PROVIDER_SITE_OTHER): Payer: Medicare (Managed Care) | Admitting: Infectious Diseases

## 2024-03-05 ENCOUNTER — Ambulatory Visit: Payer: Medicare (Managed Care) | Admitting: Student

## 2024-03-05 ENCOUNTER — Encounter: Payer: Self-pay | Admitting: Infectious Diseases

## 2024-03-05 VITALS — BP 156/71 | HR 67 | Temp 97.8°F | Ht 73.0 in | Wt 188.0 lb

## 2024-03-05 DIAGNOSIS — L02421 Furuncle of right axilla: Secondary | ICD-10-CM

## 2024-03-05 DIAGNOSIS — B20 Human immunodeficiency virus [HIV] disease: Secondary | ICD-10-CM

## 2024-03-05 DIAGNOSIS — B9562 Methicillin resistant Staphylococcus aureus infection as the cause of diseases classified elsewhere: Secondary | ICD-10-CM | POA: Diagnosis not present

## 2024-03-05 DIAGNOSIS — L02214 Cutaneous abscess of groin: Secondary | ICD-10-CM | POA: Diagnosis not present

## 2024-03-05 DIAGNOSIS — L02224 Furuncle of groin: Secondary | ICD-10-CM | POA: Diagnosis not present

## 2024-03-05 MED ORDER — CLINDAMYCIN HCL 300 MG PO CAPS: 300.0000 mg | ORAL_CAPSULE | Freq: Three times a day (TID) | ORAL | 0 refills | Status: AC

## 2024-03-05 MED ORDER — MUPIROCIN 2 % EX OINT: 1.0000 | TOPICAL_OINTMENT | Freq: Two times a day (BID) | CUTANEOUS | 1 refills | Status: AC

## 2024-03-05 MED ORDER — CHLORHEXIDINE GLUCONATE 4 % EX SOLN: Freq: Every day | CUTANEOUS | 1 refills | Status: AC

## 2024-03-05 NOTE — Progress Notes (Signed)
 NAME: Nathan [name]  DOB: Jun 07, 1950  MRN: [id number]  Date/Time: 03/05/2024 2:42 PM   Subjective:   ?urgent visit for folliculitis and boils Referred by his PCP  Nathan [name] is a 73 y.o. male with a history of DM, PAD, pancreatic insufficiency HIV on Biktarvy  Usually followed by Dr. Lindia and last seen by him on 12/18/2023 is referred to me by his PCP for recurrent MRSA infection Patient had MRSA in July and was hospitalized twice 7/17-7/19/25 for RT Upper extremity purulent cellulitis, I/D done and was discharged on Linezolid  for 6 more days- He was readmitted 7/22-7/23/25, but no surgical intervention was deemed necessary and he went out on 6 more days of linezolid   Pt has taken many courses of Doxy prior to that for boils without any culture and it turned out the MRSA was resistant to Doxy  HE saw his PCP on 10/14 and they noted new lesions on the rt axilla and sent him to me He also has a painful small boil on the left groin area No fever or chills  Past Medical History:  Diagnosis Date   Asthma    per 2003 [facility] pulm records pfts    Blood dyscrasia    HIV   Carotid artery occlusion    Clotting disorder    Colon polyps    noted previous colonoscopy [facility]   DDD (degenerative disc disease)    cervical spine   Depression    Diabetes mellitus    dx 2010   Diarrhea 02/24/2021   Dizziness 08/25/2021   Falls 08/25/2021   Fever    unknwon origin   GERD (gastroesophageal reflux disease)    Head swelling 05/23/2017   Hep C w/o coma, chronic (HCC)    History of syphilis    noted [facility] records   HIV infection (HCC)    undetectable viral load and CD4 ct 667 as of 11/2011   Hyperlipidemia    Hypertension    MRSA (methicillin resistant Staphylococcus aureus)    Multiple sclerosis    in remission as of 11/2011 (diagnosed late 1980s)   Pain in limb-Right Leg 10/13/2013   Pancreas (digestive gland) works poorly    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HCC)    2002    Pneumonia    Prosthesis fitting 03/10/2019   PVD (peripheral vascular disease)    Left Stent 07/02/2008   Scalp lesion 07/26/2017   UTI (urinary tract infection)    Wears dentures     Past Surgical History:  Procedure Laterality Date   ABDOMINAL AORTOGRAM W/LOWER EXTREMITY N/A 08/07/2017   Procedure: ABDOMINAL AORTOGRAM W/LOWER EXTREMITY;  Surgeon: Serene Gaile ORN, MD;  Location: [facility] INVASIVE CV LAB;  Service: Cardiovascular;  Laterality: N/A;   ABOVE KNEE LEG AMPUTATION  2009   Right Leg   COLONOSCOPY W/ BIOPSIES AND POLYPECTOMY     ENDARTERECTOMY Left 02/22/2023   Procedure: LEFT ENDARTERECTOMY CAROTID;  Surgeon: Serene Gaile ORN, MD;  Location: [facility] OR;  Service: Vascular;  Laterality: Left;   LOWER EXTREMITY ANGIOGRAM Left 12/26/2011   Procedure: LOWER EXTREMITY ANGIOGRAM;  Surgeon: Gaile ORN Serene, MD;  Location: [facility] CATH LAB;  Service: Cardiovascular;  Laterality: Left;   LOWER EXTREMITY ANGIOGRAM Left 08/17/2017   Procedure: LOWER EXTREMITY ANGIOGRAM LEFT LEG WITH RUNOFF AND Stenting.;  Surgeon: Serene Gaile ORN, MD;  Location: [facility] OR;  Service: Vascular;  Laterality: Left;   MULTIPLE TOOTH EXTRACTIONS     OTHER SURGICAL HISTORY     left left with stents (  Dr. Celso)   OTHER SURGICAL HISTORY     2003 colonoscopy 5 mm polyp transverse colon; (2)42mm  polyps in rectum-hyperplastic   PATCH ANGIOPLASTY Left 02/22/2023   Procedure: PATCH ANGIOPLASTY 1CMx14CM GEORGE LISLE;  Surgeon: Serene Gaile ORN, MD;  Location: [facility] OR;  Service: Vascular;  Laterality: Left;   PERIPHERAL VASCULAR INTERVENTION Left 08/17/2017   Procedure: POPLITEAL STENT;  Surgeon: Serene Gaile ORN, MD;  Location: [facility] OR;  Service: Vascular;  Laterality: Left;    Social History   Socioeconomic History   Marital status: Single    Spouse name: Not on file   Number of children: 1   Years of education: 12   Highest education level: Not on file  Occupational History   Occupation: retired  Tobacco Use    Smoking status: Former    Current packs/day: 0.00    Types: Cigarettes    Quit date: 1967    Years since quitting: 58.8    Passive exposure: Never   Smokeless tobacco: Former    Types: Chew    Quit date: 1980  Vaping Use   Vaping status: Never Used  Substance and Sexual Activity   Alcohol use: No    Alcohol/week: 0.0 standard drinks of alcohol   Drug use: No   Sexual activity: Yes    Partners: Male    Comment: declined condoms  Other Topics Concern   Not on file  Social History Narrative   Not on file   Social Drivers of Health   Financial Resource Strain: Low Risk  (04/04/2022)   Overall Financial Resource Strain (CARDIA)    Difficulty of Paying Living Expenses: Not hard at all  Food Insecurity: No Food Insecurity (11/27/2023)   Hunger Vital Sign    Worried About Running Out of Food in the Last Year: Never true    Ran Out of Food in the Last Year: Never true  Transportation Needs: No Transportation Needs (11/27/2023)   PRAPARE - Administrator, Civil Service (Medical): No    Lack of Transportation (Non-Medical): No  Physical Activity: Insufficiently Active (04/04/2022)   Exercise Vital Sign    Days of Exercise per Week: 1 day    Minutes of Exercise per Session: 20 min  Stress: No Stress Concern Present (04/04/2022)   [employer] of Occupational Health - Occupational Stress Questionnaire    Feeling of Stress : Not at all  Social Connections: Socially Isolated (11/27/2023)   Social Connection and Isolation Panel    Frequency of Communication with Friends and Family: Never    Frequency of Social Gatherings with Friends and Family: Once a week    Attends Religious Services: Never    Database Administrator or Organizations: No    Attends Banker Meetings: Never    Marital Status: Divorced  Catering Manager Violence: Not At Risk (11/27/2023)   Humiliation, Afraid, Rape, and Kick questionnaire    Fear of Current or Ex-Partner: No     Emotionally Abused: No    Physically Abused: No    Sexually Abused: No    Family History  Problem Relation Age of Onset   Diabetes Mother    Coronary artery disease Mother    Coronary artery disease Father    Liver disease Sister    Cancer Brother        colon caner stage 4 as of 11/2011 (unknown age of onset)   Prostate cancer Brother    Colon cancer Brother    Rectal  cancer Neg Hx    Esophageal cancer Neg Hx    Allergies  Allergen Reactions   Sulfonamide Derivatives Hives   I? Current Outpatient Medications  Medication Sig Dispense Refill   acetaminophen  (TYLENOL ) 500 MG tablet Take 2 tablets (1,000 mg total) by mouth every 6 (six) hours. 30 tablet 0   atorvastatin  (LIPITOR) 40 MG tablet TAKE 1 TABLET(40 MG) BY MOUTH DAILY 90 tablet 1   bictegravir-emtricitabine -tenofovir  AF (BIKTARVY ) 50-200-25 MG TABS tablet Take 1 tablet by mouth daily. 30 tablet 11   Blood Glucose Monitoring Suppl (FREESTYLE LITE) w/Device KIT 1 Device by Does not apply route daily in the afternoon. 1 kit 0   chlorhexidine  (HIBICLENS ) 4 % external liquid Apply topically daily. Shower  with chlorhexidine  every day below neck 150 mL 1   cilostazol  (PLETAL ) 50 MG tablet TAKE 1 TABLET(50 MG) BY MOUTH TWICE DAILY 180 tablet 3   clindamycin (CLEOCIN) 300 MG capsule Take 1 capsule (300 mg total) by mouth 3 (three) times daily. 21 capsule 0   clopidogrel  (PLAVIX ) 75 MG tablet TAKE 1 TABLET(75 MG) BY MOUTH DAILY 90 tablet 3   clotrimazole  (LOTRIMIN ) 1 % cream Apply 1 Application topically 2 (two) times daily. 30 g 0   Continuous Glucose Sensor (FREESTYLE LIBRE 3 PLUS SENSOR) MISC Change sensor every 15 days. 6 each 3   empagliflozin  (JARDIANCE ) 25 MG TABS tablet Take 1 tablet (25 mg total) by mouth daily. 90 tablet 3   ezetimibe  (ZETIA ) 10 MG tablet TAKE 1 TABLET(10 MG) BY MOUTH DAILY 90 tablet 2   glucose blood (FREESTYLE LITE) test strip Use as instructed 100 each 12   glucose blood (FREESTYLE LITE) test strip Use  5 times daily to check blood sugar. 450 each 3   HUMALOG  100 UNIT/ML injection Use 35 units with breakfast and 30 units with your other main meals of the day     injection device for insulin  (CEQUR SIMPLICITY 2U) DEVI change every 2 days 45 each 3   insulin  glargine, 2 Unit Dial, (TOUJEO  MAX SOLOSTAR) 300 UNIT/ML Solostar Pen Inject 50 Units into the skin in the morning.     Insulin  Pen Needle (BD PEN NEEDLE NANO U/F) 32G X 4 MM MISC 1 Device by Other route daily in the afternoon. USE AS DIRECTED FOUR TIMES DAILY. Dx code  E11.42 100 each 3   Insulin  Syringe-Needle U-100 (GNP INSULIN  SYRINGES) 30G X 5/16 1 ML MISC 1 Device by Does not apply route daily in the afternoon. 100 each 3   Lancet Devices (BAYER MICROLET 2 LANCING DEVIC) MISC Use to check blood sugar up to 3 ties a day 1 each 2   Lancets (FREESTYLE) lancets Use 5 times daily to check blood sugar. 450 each 3   linezolid  (ZYVOX ) 600 MG tablet Take 1 tablet (600 mg total) by mouth every 12 (twelve) hours for 6 days. 12 tablet 0   lipase/protease/amylase (CREON ) 36000 UNITS CPEP capsule TAKE 1 CAPSULE BY MOUTH THREE TIMES DAILY BEFORE A MEAL 200 capsule 3   lisinopril  (ZESTRIL ) 40 MG tablet TAKE 1 TABLET BY MOUTH DAILY. HOLD IF SYSTOLIC BLOOD PRESSURE IS LESS THAN 110 90 tablet 3   mupirocin  ointment (BACTROBAN ) 2 % Apply 1 Application topically 2 (two) times daily. 22 g 1   omeprazole  (PRILOSEC) 40 MG capsule TAKE 1 CAPSULE(40 MG) BY MOUTH DAILY 90 capsule 1   pioglitazone  (ACTOS ) 30 MG tablet TAKE 1 TABLET(30 MG) BY MOUTH DAILY 90 tablet 3   tirzepatide  (MOUNJARO )  15 MG/0.5ML Pen Inject 15 mg into the skin once a week. 2 mL 3   No current facility-administered medications for this visit.     Abtx:  Anti-infectives (From admission, onward)    Start     Dose/Rate Route Frequency Ordered Stop   03/05/24 0000  clindamycin (CLEOCIN) 300 MG capsule        300 mg Oral 3 times daily 03/05/24 1437         REVIEW OF SYSTEMS:  Const:  negative fever, negative chills, negative weight loss Painful nodules rt axilla and left groin Objective:  VITALS:  BP (!) 156/71   Pulse 67   Temp 97.8 F (36.6 C) (Temporal)   Ht 6' 1 (1.854 m)   Wt 188 lb (85.3 kg)   SpO2 94%   BMI 24.80 kg/m   PHYSICAL EXAM:  General: Alert, cooperative, no distress, appears stated age.  Lungs: Clear to auscultation bilaterally. No Wheezing or Rhonchi. No rales. Heart: Regular rate and rhythm, no murmur, rub or gallop. Abdomen: Soft, non-tender,not distended. Bowel sounds normal. No masses Extremities: atraumatic, no cyanosis. No edema. No clubbing Skin: rt axilla resolving erythematous nodules Left groin pustual lesion small painful  Lymph: Cervical, supraclavicular normal. Neurologic: Grossly non-focal Pertinent Labs Non recenlty Micro     ? Impression/Recommendation ? Recurrent boils Popping up in his right axilla and left groin MRSA Patient needs decolonization at this time Will treat him with a week of clindamycin 300 mg 3 times daily and then do an extensive decolonization with chlorhexidine  and mupirocin  and also taking care of environmental hygiene Information given to the patient and can see it in after visit summary.  HIV on Biktarvy  On 12/04/2022 viral load was undetectable and CD4 count was 691. ? Did extensive counseling on environmental hygiene and decolonization protocol   Follow-up in 2 weeks if needed for MRSA with me. Otherwise he will follow-up with Dr. Lindia for HIV in February 2026.  ___________________________ Note:  This document was prepared using Dragon voice recognition software and may include unintentional dictation errors.

## 2024-03-05 NOTE — Telephone Encounter (Signed)
 Patient called to request acute visit as advised by PCP for skin infection/cellulitis to armpit and grion area. Patient complete doxy 7 day course with no improvement. Scheduling patient for 2pm appointment today with Dr. Fayette.   Nathan Kleine, LPN

## 2024-03-05 NOTE — Patient Instructions (Addendum)
 Today I am giving you clindamycin 300mg  three times a day for 7 days-   take plain yogurt Preventive educational messages on personal hygiene and appropriate wound care are recommended for all patients with SSTI. I   PERSONAL HYGIENE  i. Keep draining wounds covered with clean, dry bandages . ii. Maintain good personal hygiene with regular bathing and cleaning of hands with soap and water or an alcohol-based hand gel, particularly after touching infected skin or an item that has directly contacted a draining wound   iii. Avoid reusing or sharing personal items (eg, disposable razors, linens, and towels) that have contacted infected skin   ENVIRONMENTAL HYGIENE  Environmental hygiene measures should be considered in patients with recurrent SSTI in the household or community setting:  i. Focus cleaning efforts on high-touch surfaces (ie, surfaces that come into frequent contact with people's bare skin each day, such as counters, door knobs, bath tubs, and toilet seats) that may contact bare skin or uncovered infections  ii. Commercially available cleaners or detergents appropriate for the surface being cleaned should be used according to label instructions for routine cleaning of surfaces      . Decolonization strategies should be offered in conjunction with ongoing reinforcement of hygiene measures and may include the following:   i. Nasal decolonization with mupirocin  twice daily for 5-10 days   ii. Nasal decolonization with mupirocin  twice daily for 5-10 days and topical body decolonization regimens with a skin antiseptic solution (eg, chlorhexidine ) for 5-14 days or dilute bleach baths. (For dilute bleach baths, 1 teaspoon per gallon of water [or  cup per  tub or 13 gallons of water] given for 15 min twice weekly for ~3 months can be considered.)   Oral antimicrobial therapy is recommended for the treatment of active infection only and is not routinely recommended for decolonization . An  oral agent in combination with rifampin, if the strain is susceptible, may be considered for decolonization if infections recur despite above measures        To prepare yourself for your treatment, it is recommended that you complete the following steps: Remove nose, ear and other body piercing items for several days prior to the treatment and keep them out during the treatment period  Purchase a new toothbrush, disposable razor (if used), sterident for dentures (if required) and a container of alcohol hand hygiene solution (gel or rub)  Discard old toothbrushes and razors when the treatment starts. Also discard opened deodorant rollers, skin adhesive tapes, skin creams and solutions- all of these may already be contaminated with staph  Discard pumice stone(s), sponges and disposable face cloths if used   Discard all make-up brushes, creams, and implements  Discard or hot wash all fluffy toys  Wash hair brush and comb, nail files, plastic toys and cutters in the dishwasher or purchase new ones  Remove nail varnish and artificial nails  Daily routine for 5 days     *Minimize contact with members of the community during the 5 days of treatment as much as possible*  [employer] and Clothes/Linens On day 2 and 5 of the treatment, clean your house, (especially the bedroom and bathroom). Clean dust off all surfaces and then vacuum clean all floor surfaces AND soft furnishings (such as your favorite chair). If your chair/couch has a vinyl or leather covering then wipe over the chair with warm soapy water and then dry with a clean towel (which should then be washed). Staph lives in skin scales from humans  that contaminate the environment. This can lead to re-infection.   Disinfect the shower floor and/or bath tub daily   On days 1, 3, AND 5 of the treatment wash your clothes, underwear, pajamas and bed linen (such as towels, sheets, washcloths, and bath mats). A hot wash with laundry detergent is  best (there is no need to use expensive laundry detergent or powder). Dry clothes in sun if possible. Change into clean clothes or pajamas on those days after your shower.   Do not share or exchange any personal items of clothing

## 2024-03-08 LAB — AEROBIC CULTURE
MICRO NUMBER:: 17165107
SPECIMEN QUALITY:: ADEQUATE

## 2024-03-09 ENCOUNTER — Other Ambulatory Visit: Payer: Self-pay | Admitting: Student

## 2024-03-11 ENCOUNTER — Emergency Department (HOSPITAL_COMMUNITY): Payer: Medicare (Managed Care)

## 2024-03-11 ENCOUNTER — Emergency Department (HOSPITAL_COMMUNITY)
Admission: EM | Admit: 2024-03-11 | Discharge: 2024-03-11 | Disposition: A | Discharge status: Home / Self Care | Payer: Medicare (Managed Care) | Attending: Emergency Medicine | Admitting: Emergency Medicine

## 2024-03-11 DIAGNOSIS — Z7984 Long term (current) use of oral hypoglycemic drugs: Secondary | ICD-10-CM | POA: Diagnosis not present

## 2024-03-11 DIAGNOSIS — Z79899 Other long term (current) drug therapy: Secondary | ICD-10-CM | POA: Insufficient documentation

## 2024-03-11 DIAGNOSIS — S0990XA Unspecified injury of head, initial encounter: Secondary | ICD-10-CM | POA: Insufficient documentation

## 2024-03-11 DIAGNOSIS — S52591A Other fractures of lower end of right radius, initial encounter for closed fracture: Secondary | ICD-10-CM | POA: Diagnosis not present

## 2024-03-11 DIAGNOSIS — J45909 Unspecified asthma, uncomplicated: Secondary | ICD-10-CM | POA: Insufficient documentation

## 2024-03-11 DIAGNOSIS — Z043 Encounter for examination and observation following other accident: Secondary | ICD-10-CM | POA: Diagnosis not present

## 2024-03-11 DIAGNOSIS — I2510 Atherosclerotic heart disease of native coronary artery without angina pectoris: Secondary | ICD-10-CM | POA: Insufficient documentation

## 2024-03-11 DIAGNOSIS — M7989 Other specified soft tissue disorders: Secondary | ICD-10-CM | POA: Diagnosis not present

## 2024-03-11 DIAGNOSIS — Z21 Asymptomatic human immunodeficiency virus [HIV] infection status: Secondary | ICD-10-CM | POA: Diagnosis not present

## 2024-03-11 DIAGNOSIS — M25531 Pain in right wrist: Secondary | ICD-10-CM | POA: Diagnosis not present

## 2024-03-11 DIAGNOSIS — J432 Centrilobular emphysema: Secondary | ICD-10-CM | POA: Diagnosis not present

## 2024-03-11 DIAGNOSIS — M79641 Pain in right hand: Secondary | ICD-10-CM | POA: Diagnosis not present

## 2024-03-11 DIAGNOSIS — Z794 Long term (current) use of insulin: Secondary | ICD-10-CM | POA: Diagnosis not present

## 2024-03-11 DIAGNOSIS — I10 Essential (primary) hypertension: Secondary | ICD-10-CM | POA: Insufficient documentation

## 2024-03-11 DIAGNOSIS — Z7901 Long term (current) use of anticoagulants: Secondary | ICD-10-CM | POA: Diagnosis not present

## 2024-03-11 DIAGNOSIS — M8580 Other specified disorders of bone density and structure, unspecified site: Secondary | ICD-10-CM | POA: Diagnosis not present

## 2024-03-11 DIAGNOSIS — M85841 Other specified disorders of bone density and structure, right hand: Secondary | ICD-10-CM | POA: Diagnosis not present

## 2024-03-11 DIAGNOSIS — S52501A Unspecified fracture of the lower end of right radius, initial encounter for closed fracture: Secondary | ICD-10-CM | POA: Diagnosis not present

## 2024-03-11 DIAGNOSIS — E119 Type 2 diabetes mellitus without complications: Secondary | ICD-10-CM | POA: Diagnosis not present

## 2024-03-11 DIAGNOSIS — W1839XA Other fall on same level, initial encounter: Secondary | ICD-10-CM | POA: Insufficient documentation

## 2024-03-11 DIAGNOSIS — M5031 Other cervical disc degeneration,  high cervical region: Secondary | ICD-10-CM | POA: Diagnosis not present

## 2024-03-11 DIAGNOSIS — I6521 Occlusion and stenosis of right carotid artery: Secondary | ICD-10-CM | POA: Diagnosis not present

## 2024-03-11 DIAGNOSIS — W19XXXA Unspecified fall, initial encounter: Secondary | ICD-10-CM

## 2024-03-11 DIAGNOSIS — S199XXA Unspecified injury of neck, initial encounter: Secondary | ICD-10-CM | POA: Diagnosis not present

## 2024-03-11 LAB — CBG MONITORING, ED: Glucose-Capillary: 254 mg/dL — ABNORMAL HIGH (ref 70–99)

## 2024-03-11 MED ORDER — OXYCODONE HCL 5 MG PO TABS: 5.0000 mg | ORAL_TABLET | Freq: Four times a day (QID) | ORAL | 0 refills | Status: AC | PRN

## 2024-03-11 MED ORDER — ACETAMINOPHEN 500 MG PO TABS
1000.0000 mg | ORAL_TABLET | Freq: Once | ORAL | Status: AC
  Administered 2024-03-11: 1000 mg via ORAL
  Filled 2024-03-11: qty 2

## 2024-03-11 NOTE — Discharge Instructions (Addendum)
 Thank you for coming to [facility] Emergency Department. You were seen for fall, headache, right wrist swelling/pain. We did an exam, and imaging, and these showed a subtle fracture in your wrist. We will treat with a short arm splint. Please keep your wrist elevated above your heart to help improve swelling and pain. You can take tylenol  1,000 mg every 8 hours for pain.   You can take oxycodone  5 mg every 4-6 hours as well for severe pain. Please do not drive or operate machinery while taking this medicine. It can also increase your risk for falls. Please take 1-2 capfuls of miralax  daily while taking oxycodone  as it can cause constipation.  Please follow up with an orthopedic surgeon Dr. Genelle within 1 week. You can call his office at [phone] to make an appointment.  Do not hesitate to return to the ED or call 911 if you experience: -Worsening symptoms -Numbness/tingling in the hand/fingers -Severe or worsening swelling in the hand/wrist -Lightheadedness, passing out -Fevers/chills -Anything else that concerns you

## 2024-03-11 NOTE — ED Provider Notes (Signed)
 [facility] Provider Note   CSN: [id number] Arrival date & time: 03/11/24  1019     History  Chief Complaint  Patient presents with   Fall   Trauma    Nathan [name] is a 73 y.o. male with HIV, HTN, HLD, T2DM, history of syphilis, pancreatic insufficiency, carotid artery disease, MRSA colonization, asthma, PVD, chronic hep C who presents as a level 2 trauma fall on thinners.  Patient fell yesterday while leaf blowing.  He has a history of a right AKA and states that he did not sit down on the ground right and fell as a result of that.  Fell backward and hit the back of his head on the ground.  Did not lose consciousness but thought he might.  Laid on the ground for 30 minutes due to pain in his head.  Denies any lightheadedness, chest pain, abdominal pain, back pain or neck pain.  Because his head was hurting so badly he did not immediately noticed severe pain in his right hand and wrist but then later he noticed it became very painful and started to swell.  Still has a headache today, not as severe as yesterday but did notice it.  Not any visual changes.  Otherwise had been in his normal state of health.  Taking antibiotics for MRSA colonization and boils in his groin which are improving.    Past Medical History:  Diagnosis Date   Asthma    per 2003 [facility] pulm records pfts    Blood dyscrasia    HIV   Carotid artery occlusion    Clotting disorder    Colon polyps    noted previous colonoscopy [facility]   DDD (degenerative disc disease)    cervical spine   Depression    Diabetes mellitus    dx 2010   Diarrhea 02/24/2021   Dizziness 08/25/2021   Falls 08/25/2021   Fever    unknwon origin   GERD (gastroesophageal reflux disease)    Head swelling 05/23/2017   Hep C w/o coma, chronic (HCC)    History of syphilis    noted [facility] records   HIV infection (HCC)    undetectable viral load and CD4 ct 667 as of 11/2011   Hyperlipidemia     Hypertension    MRSA (methicillin resistant Staphylococcus aureus)    Multiple sclerosis    in remission as of 11/2011 (diagnosed late 1980s)   Pain in limb-Right Leg 10/13/2013   Pancreas (digestive gland) works poorly    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HCC)    2002   Pneumonia    Prosthesis fitting 03/10/2019   PVD (peripheral vascular disease)    Left Stent 07/02/2008   Scalp lesion 07/26/2017   UTI (urinary tract infection)    Wears dentures        Home Medications Prior to Admission medications   Medication Sig Start Date End Date Taking? Authorizing Provider  acetaminophen  (TYLENOL ) 500 MG tablet Take 2 tablets (1,000 mg total) by mouth every 6 (six) hours. 11/24/23   Elnora Ip, MD  atorvastatin  (LIPITOR) 40 MG tablet TAKE 1 TABLET(40 MG) BY MOUTH DAILY 11/26/23   Shamleffer, Ibtehal Jaralla, MD  bictegravir-emtricitabine -tenofovir  AF (BIKTARVY ) 50-200-25 MG TABS tablet Take 1 tablet by mouth daily. 12/18/23   Fleeta Kathie Jomarie LOISE, MD  Blood Glucose Monitoring Suppl (FREESTYLE LITE) w/Device KIT 1 Device by Does not apply route daily in the afternoon. 03/28/22   Shamleffer, Ibtehal Jaralla, MD  chlorhexidine  (HIBICLENS ) 4 % external liquid Apply topically daily. Shower  with chlorhexidine  every day below neck 03/05/24   Fayette Bodily, MD  cilostazol  (PLETAL ) 50 MG tablet TAKE 1 TABLET(50 MG) BY MOUTH TWICE DAILY 02/25/24   Nooruddin, Saad, MD  clindamycin (CLEOCIN) 300 MG capsule Take 1 capsule (300 mg total) by mouth 3 (three) times daily. 03/05/24   Fayette Bodily, MD  clopidogrel  (PLAVIX ) 75 MG tablet TAKE 1 TABLET(75 MG) BY MOUTH DAILY 03/22/23   Nooruddin, Roetta, MD  clotrimazole  (LOTRIMIN ) 1 % cream Apply 1 Application topically 2 (two) times daily. 12/04/23   Norrine Sharper, MD  Continuous Glucose Sensor (FREESTYLE LIBRE 3 SENSOR) MISC APPLY 1 SENSOR TO SKIN EVERY 14 DAYS. USE TO CHECK GLUCOSE CONTINUOUSLY 03/10/24   Nooruddin, Saad, MD   empagliflozin  (JARDIANCE ) 25 MG TABS tablet Take 1 tablet (25 mg total) by mouth daily. 05/25/23   Shamleffer, Ibtehal Jaralla, MD  ezetimibe  (ZETIA ) 10 MG tablet TAKE 1 TABLET(10 MG) BY MOUTH DAILY 02/22/24   Nooruddin, Saad, MD  glucose blood (FREESTYLE LITE) test strip Use as instructed 01/10/23   Nooruddin, Saad, MD  glucose blood (FREESTYLE LITE) test strip Use 5 times daily to check blood sugar. 04/11/23   Gregary Sharper, MD  HUMALOG  100 UNIT/ML injection Use 35 units with breakfast and 30 units with your other main meals of the day 12/25/23   Norrine Sharper, MD  injection device for insulin  (CEQUR SIMPLICITY 2U) DEVI change every 2 days 01/31/23   Shamleffer, Ibtehal Jaralla, MD  insulin  glargine, 2 Unit Dial, (TOUJEO  MAX SOLOSTAR) 300 UNIT/ML Solostar Pen Inject 50 Units into the skin in the morning. 12/05/23   Norrine Sharper, MD  Insulin  Pen Needle (BD PEN NEEDLE NANO U/F) 32G X 4 MM MISC 1 Device by Other route daily in the afternoon. USE AS DIRECTED FOUR TIMES DAILY. Dx code  E11.42 05/25/23   Shamleffer, Ibtehal Jaralla, MD  Insulin  Syringe-Needle U-100 (GNP INSULIN  SYRINGES) 30G X 5/16 1 ML MISC 1 Device by Does not apply route daily in the afternoon. 09/26/22   Shamleffer, Donell Cardinal, MD  Lancet Devices (BAYER MICROLET 2 LANCING [facility]) MISC Use to check blood sugar up to 3 ties a day 04/08/15   Prentiss Prentice SAILOR, DO  Lancets (FREESTYLE) lancets Use 5 times daily to check blood sugar. 07/19/21   Gawaluck, Greylon, MD  linezolid  (ZYVOX ) 600 MG tablet Take 1 tablet (600 mg total) by mouth every 12 (twelve) hours for 6 days. 12/25/23 03/05/24  Norrine Sharper, MD  lipase/protease/amylase (CREON ) 36000 UNITS CPEP capsule TAKE 1 CAPSULE BY MOUTH THREE TIMES DAILY BEFORE A MEAL 12/24/23   Nooruddin, Saad, MD  lisinopril  (ZESTRIL ) 40 MG tablet TAKE 1 TABLET BY MOUTH DAILY. HOLD IF SYSTOLIC BLOOD PRESSURE IS LESS THAN 110 02/04/24   Nooruddin, Saad, MD  mupirocin  ointment (BACTROBAN ) 2 % Apply  1 Application topically 2 (two) times daily. 03/05/24   Fayette Bodily, MD  omeprazole  (PRILOSEC) 40 MG capsule TAKE 1 CAPSULE(40 MG) BY MOUTH DAILY 02/22/24   Nooruddin, Saad, MD  pioglitazone  (ACTOS ) 30 MG tablet TAKE 1 TABLET(30 MG) BY MOUTH DAILY 10/08/23   Shamleffer, Ibtehal Jaralla, MD  tirzepatide  (MOUNJARO ) 15 MG/0.5ML Pen Inject 15 mg into the skin once a week. 02/21/24   Norrine Sharper, MD      Allergies    Sulfonamide derivatives    Review of Systems   Review of Systems A 10 point review of systems was performed and is negative unless otherwise reported  in HPI.  Physical Exam Updated Vital Signs BP (!) 151/81 (BP Location: Left Arm)   Pulse 61   Temp 97.9 F (36.6 C) (Oral)   Resp 16   Ht 6' 1 (1.854 m)   Wt 85.3 kg   SpO2 100%   BMI 24.80 kg/m  Physical Exam  PRIMARY SURVEY  Airway Airway intact  Breathing Bilateral breath sounds  Circulation Carotid/femoral pulses 2+ intact bilaterally  GCS E =  4 V =  5 M =  6 Total = 15  Environment All clothes removed      SECONDARY SURVEY  Gen: -NAD  HEENT: -Head: NCAT. Scalp is clear of lacerations or wounds. Skull is clear of deformities or depressions -Forehead: Normal -Midface: Stable -Eyes: No visible injury to eyelids or eye, PERRL, EOMI -Nose: No gross deformities -Mouth: No injuries to lips, tongue or teeth. No trismus or malposition -Ears: No auricular hematoma -Neck: Trachea is midline, no distended neck veins  Chest: -No tenderness, deformities, bruising or crepitus to clavicles or chest -Normal chest expansion -Normal heart sounds, S1/S2 normal, no m/r/g -No wheezes, rales, rhonchi  Abdomen: -No tenderness, bruising or penetrating injury  Pelvis: -Pelvis is stable and non-tender  Extremities: Right Upper Extremity: -Diffusely swollen R wrist and dorsum of R hand with diffuse TTP -No obvious deformity noted -+TTP at anatomic snuffbox on R -Limited ROM of R wrist and hand d/t pain, but full  ROM in fingers -Soft compartments -Radial pulse intact RUE, cap refill good -Normal sensation Left Upper Extremity: -No point tenderness, deformity or other signs of injury -Radial pulse intact LUE, cap refill good -Normal sensation -Normal ROM, good strength Right Lower Extremity: -S/p R AKA w/ prosthesis from hip down Left Lower Extremity: -No point tenderness, deformity or other signs of injury -DP intact LLE -Normal sensation -Normal ROM, good strength  Back/Spine: -No midline C T or L spine tenderness or step-offs  Other: N/A     ED Results / Procedures / Treatments   Labs (all labs ordered are listed, but only abnormal results are displayed) Labs Reviewed  CBG MONITORING, ED - Abnormal; Notable for the following components:      Result Value   Glucose-Capillary 254 (*)    All other components within normal limits    EKG EKG Interpretation Date/Time:  Tuesday March 11 2024 12:11:26 EST Ventricular Rate:  65 PR Interval:  186 QRS Duration:  107 QT Interval:  437 QTC Calculation: 455 R Axis:   74  Text Interpretation: Sinus rhythm Artifact in lead(s) I III aVR aVL aVF and baseline wander in lead(s) V4 Confirmed by Franklyn Gills [phone]) on 03/11/2024 1:07:27 PM  Radiology CTH: 1. No acute traumatic injury. 2. Normal for age non-contrast CT appearance of the brain. 3. Chronic suboccipital subcutaneous lipoma(s) suspected.  CT C_spine 1. No acute traumatic injury identified in the cervical spine. 2. Chronic cervical spine degeneration, most pronounced at C3-C4. 3. Chronic severe calcified right carotid atherosclerosis in the neck. Emphysema. Posterior subcutaneous lipoma(s).  DG R wrist: Soft tissue swelling about the wrist. While no acute, displaced fracture is visualized, given the soft tissue swelling and osteopenia, if concern persists, a follow-up CT of the wrist may be of benefit for further characterization.  DG R hand: Soft tissue swelling about  the dorsum of the hand. Osteopenia. No acute fracture or dislocation.  CT R wrist: 1. Subtle nondisplaced distal radial fracture extending from the volar distal metaphysis to the distal articular surface with involvement of  Lister's tubercle. 2. Dorsal subcutaneous edema in the hand.  Procedures Procedures    Medications Ordered in ED Medications  acetaminophen  (TYLENOL ) tablet 1,000 mg (1,000 mg Oral Given 03/11/24 1731)    ED Course/ Medical Decision Making/ A&P                          Medical Decision Making Amount and/or Complexity of Data Reviewed Labs:  Decision-making details documented in ED Course. Radiology: ordered. Decision-making details documented in ED Course.  Risk OTC drugs. Prescription drug management.    This patient presents to the ED for concern of fall, headache and R wrist/hand pain, this involves an extensive number of treatment options, and is a complaint that carries with it a high risk of complications and morbidity.  I considered the following differential and admission for this acute, potentially life threatening condition.  Patient is overall extremely well-appearing and hemodynamically stable.  MDM:    DDX for mechanical fall, head and wrist trauma includes but is not limited to:  -Head Injury such as skull fx or ICH -reassuringly no ICH on CT. headache improved in the ED with Tylenol . -Vertebral injury -CT C-spine negative for any acute fracture or trauma -Fractures -concern for right wrist fracture.  Initial x-rays negative but he does have significant swelling.  No concern for compartment syndrome as compartments are swollen but not taut and he has good cap refill and sensation but high degree of concern for occult fracture.  Obtained CT wrist which does show a subtle nondisplaced distal radius fracture.  Patient is placed in a splint.  Signed out to oncoming physician Dr. Emil who will perform a post-splint assessment and discharge patient.   He is instructed to follow-up with orthopedic surgery within 1 week.  Discussed with patient about keeping arm and hand elevated to help prevent swelling and pain.  Advised taking Tylenol  at home and given oxycodone  for breakthrough pain.  Advised that he take great care when taking oxycodone  as it can cause drowsiness and increased risk for falls; also advised taking MiraLAX  while taking oxycodone  to help prevent constipation.   Clinical Course as of 03/15/24 2247  Tue Mar 11, 2024  1238 Glucose-Capillary(!): 254  Known type 2 diabetes [HN]  1307 CT Head Wo Contrast 1. No acute traumatic injury. 2. Normal for age non-contrast CT appearance of the brain. 3. Chronic suboccipital subcutaneous lipoma(s) suspected.   [HN]  1319 CT Cervical Spine Wo Contrast 1. No acute traumatic injury identified in the cervical spine. 2. Chronic cervical spine degeneration, most pronounced at C3-C4. 3. Chronic severe calcified right carotid atherosclerosis in the neck. Emphysema. Posterior subcutaneous lipoma(s).   [HN]  1319 DG Wrist Complete Right Soft tissue swelling about the wrist. While no acute, displaced fracture is visualized, given the soft tissue swelling and osteopenia, if concern persists, a follow-up CT of the wrist may be of benefit for further characterization.   [HN]  1358 DG Hand Complete Right Soft tissue swelling about the dorsum of the hand. Osteopenia. No acute fracture or dislocation.   [HN]  1519 CT Wrist Right Wo Contrast 1. Subtle nondisplaced distal radial fracture extending from the volar distal metaphysis to the distal articular surface with involvement of Lister's tubercle. 2. Dorsal subcutaneous edema in the hand.   [HN]    Clinical Course User Index [HN] Franklyn Sid SAILOR, MD    Labs: I Ordered, and personally interpreted labs.  The pertinent results include: POC  glucose  Imaging Studies ordered: I ordered imaging studies including CTH, CT C-spine, RUE XRs, CT  right wrist I independently visualized and interpreted imaging. I agree with the radiologist interpretation  Additional history obtained from chart review.    Reevaluation: After the interventions noted above, I reevaluated the patient and found that they have :improved  Social Determinants of Health: Lives independently  Disposition: DC after splint evaluation with Ortho follow-up  Co morbidities that complicate the patient evaluation  Past Medical History:  Diagnosis Date   Asthma    per 2003 [facility] pulm records pfts    Blood dyscrasia    HIV   Carotid artery occlusion    Clotting disorder    Colon polyps    noted previous colonoscopy [facility]   DDD (degenerative disc disease)    cervical spine   Depression    Diabetes mellitus    dx 2010   Diarrhea 02/24/2021   Dizziness 08/25/2021   Falls 08/25/2021   Fever    unknwon origin   GERD (gastroesophageal reflux disease)    Head swelling 05/23/2017   Hep C w/o coma, chronic (HCC)    History of syphilis    noted [facility] records   HIV infection (HCC)    undetectable viral load and CD4 ct 667 as of 11/2011   Hyperlipidemia    Hypertension    MRSA (methicillin resistant Staphylococcus aureus)    Multiple sclerosis    in remission as of 11/2011 (diagnosed late 1980s)   Pain in limb-Right Leg 10/13/2013   Pancreas (digestive gland) works poorly    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HCC)    2002   Pneumonia    Prosthesis fitting 03/10/2019   PVD (peripheral vascular disease)    Left Stent 07/02/2008   Scalp lesion 07/26/2017   UTI (urinary tract infection)    Wears dentures      Medicines Meds ordered this encounter  Medications   acetaminophen  (TYLENOL ) tablet 1,000 mg   oxyCODONE  (ROXICODONE ) 5 MG immediate release tablet    Sig: Take 1 tablet (5 mg total) by mouth every 6 (six) hours as needed for up to 3 days for severe pain (pain score 7-10).    Dispense:  18 tablet    Refill:  0    I have reviewed the  patients home medicines and have made adjustments as needed  Problem List / ED Course: Problem List Items Addressed This Visit   None Visit Diagnoses       Other closed fracture of distal end of right radius, initial encounter    -  Primary     Fall in home, initial encounter         Minor head trauma                       This note was created using dictation software, which may contain spelling or grammatical errors.    Franklyn Sid SAILOR, MD 03/15/24 2252

## 2024-03-11 NOTE — ED Triage Notes (Signed)
 Patient arrives POV for a wrist injury occurring last night. After speaking to RN patient states he fell and hit his head, is on clopidigrel, denies LOC but states he laid on the ground for 30 mins prior to getting up because of his head hurting. GCS 15, VSS, endorses headache and wrist pain. R wrist has redness, swelling and warmth.

## 2024-03-11 NOTE — Progress Notes (Signed)
 Orthopedic Tech Progress Note Patient Details:  Nathan [name] May 18, 1950 [id number]  Ortho Devices Type of Ortho Device: Ace wrap, Volar splint Ortho Device/Splint Location: RUE Ortho Device/Splint Interventions: Ordered, Application, Adjustment  LEVEL 2 TRAUMA this afternoon. Spoke with ORTHO and was told a VOLAR splint would do just fine, no need for thumb spica  Post Interventions Patient Tolerated: Fair Instructions Provided: Care of device  [name] Pac 03/11/2024, 4:07 PM

## 2024-03-13 ENCOUNTER — Ambulatory Visit: Payer: Self-pay

## 2024-03-20 ENCOUNTER — Ambulatory Visit: Payer: Medicare (Managed Care) | Admitting: Orthopaedic Surgery

## 2024-03-20 DIAGNOSIS — S52551A Other extraarticular fracture of lower end of right radius, initial encounter for closed fracture: Secondary | ICD-10-CM | POA: Diagnosis not present

## 2024-03-20 NOTE — Progress Notes (Signed)
 Office Visit Note   Patient: Nathan [name]           Date of Birth: [birth year]           MRN: [id number] Visit Date: 03/20/2024              Requested by: Nooruddin, Saad, MD [address] [place], [address] [place],  [place] [address] PCP: Nooruddin, Saad, MD   Assessment & Plan: Visit Diagnoses:  1. Other closed extra-articular fracture of distal end of right radius, initial encounter     Plan: History of Present Illness Nathan [name] is a 73 year old male who presents with a wrist injury following a fall.  He is following up after an emergency department visit for a wrist injury sustained about a week ago. He fell onto his wrist, resulting in intermittent pain described as severe at times. There is significant discoloration of the wrist.  He lives with a roommate but essentially lives alone due to minimal communication. He has a prosthetic leg on the right side and experiences frequent falls, which have been increasing in frequency.  Physical Exam EXTREMITIES: Mild swelling and bruising around the wrist. Sensation intact in fingertips. Good pulse in wrist.  Results RADIOLOGY Wrist X-ray: Subtle fracture of distal radius metaphysis (03/13/2024)  Assessment and Plan Right distal radius fracture Subtle hairline fracture with good alignment. No surgery needed. Prosthetic leg increases fall risk. - Due to fall risk will immobilize with a short arm cast - Nonweightbearing.  Recheck in 3 weeks with repeat imaging out of the cast.  Follow-Up Instructions: Return in about 3 weeks (around 04/10/2024).   Orders:  No orders of the defined types were placed in this encounter.  No orders of the defined types were placed in this encounter.     Procedures: No procedures performed   Clinical Data: No additional findings.   Subjective: Chief Complaint  Patient presents with   Right Wrist - Injury    DOI 03/11/2024    HPI  Review of Systems  Constitutional: Negative.    HENT: Negative.    Eyes: Negative.   Respiratory: Negative.    Cardiovascular: Negative.   Gastrointestinal: Negative.   Endocrine: Negative.   Genitourinary: Negative.   Skin: Negative.   Allergic/Immunologic: Negative.   Neurological: Negative.   Hematological: Negative.   Psychiatric/Behavioral: Negative.    All other systems reviewed and are negative.    Objective: Vital Signs: There were no vitals taken for this visit.  Physical Exam Vitals and nursing note reviewed.  Constitutional:      Appearance: He is well-developed.  HENT:     Head: Normocephalic and atraumatic.  Eyes:     Pupils: Pupils are equal, round, and reactive to light.  Pulmonary:     Effort: Pulmonary effort is normal.  Abdominal:     Palpations: Abdomen is soft.  Musculoskeletal:        General: Normal range of motion.     Cervical back: Neck supple.  Skin:    General: Skin is warm.  Neurological:     Mental Status: He is alert and oriented to person, place, and time.  Psychiatric:        Behavior: Behavior normal.        Thought Content: Thought content normal.        Judgment: Judgment normal.     Ortho Exam  Specialty Comments:  No specialty comments available.  Imaging: No results found.   PMFS History:  Patient Active Problem List   Diagnosis Date Noted   Cellulitis 02/22/2024   Other complications of amputation stump (HCC) 12/25/2023   Intertrigo 12/04/2023   Eschar of lower leg 11/27/2023   Antiplatelet or antithrombotic long-term use 06/20/2022   Pancreatic insufficiency 06/04/2022   Dyslipidemia 09/12/2021   Tobacco use disorder, moderate, in sustained remission 08/30/2020   Type 2 DM with diabetic peripheral angiopathy w/o gangrene (HCC) 02/19/2019   History of syphilis 10/09/2013   Atherosclerosis of native artery of extremity with intermittent claudication 04/08/2012   Carotid artery stenosis 12/11/2011   Healthcare maintenance 11/23/2011   Hypoglycemia  associated with diabetes (HCC) 04/05/2009   Benign essential HTN 02/13/2008   Hyperlipidemia associated with type 2 diabetes mellitus (HCC) 07/29/2007   HIV disease (HCC) 02/12/2006   COLONIC POLYPS, ADENOMATOUS 02/12/2006   Multiple sclerosis 02/12/2006   PAD (peripheral artery disease) 02/12/2006   GERD 02/12/2006   Hepatitis B core antibody positive 02/12/2006   Past Medical History:  Diagnosis Date   Asthma    per 2003 [facility] pulm records pfts    Blood dyscrasia    HIV   Carotid artery occlusion    Clotting disorder    Colon polyps    noted previous colonoscopy [facility]   DDD (degenerative disc disease)    cervical spine   Depression    Diabetes mellitus    dx 2010   Diarrhea 02/24/2021   Dizziness 08/25/2021   Falls 08/25/2021   Fever    unknwon origin   GERD (gastroesophageal reflux disease)    Head swelling 05/23/2017   Hep C w/o coma, chronic (HCC)    History of syphilis    noted [facility] records   HIV infection (HCC)    undetectable viral load and CD4 ct 667 as of 11/2011   Hyperlipidemia    Hypertension    MRSA (methicillin resistant Staphylococcus aureus)    Multiple sclerosis    in remission as of 11/2011 (diagnosed late 1980s)   Pain in limb-Right Leg 10/13/2013   Pancreas (digestive gland) works poorly    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HCC)    2002   Pneumonia    Prosthesis fitting 03/10/2019   PVD (peripheral vascular disease)    Left Stent 07/02/2008   Scalp lesion 07/26/2017   UTI (urinary tract infection)    Wears dentures     Family History  Problem Relation Age of Onset   Diabetes Mother    Coronary artery disease Mother    Coronary artery disease Father    Liver disease Sister    Cancer Brother        colon caner stage 4 as of 11/2011 (unknown age of onset)   Prostate cancer Brother    Colon cancer Brother    Rectal cancer Neg Hx    Esophageal cancer Neg Hx     Past Surgical History:  Procedure Laterality Date   ABDOMINAL  AORTOGRAM W/LOWER EXTREMITY N/A 08/07/2017   Procedure: ABDOMINAL AORTOGRAM W/LOWER EXTREMITY;  Surgeon: Serene Gaile ORN, MD;  Location: [facility];  Service: Cardiovascular;  Laterality: N/A;   ABOVE KNEE LEG AMPUTATION  2009   Right Leg   COLONOSCOPY W/ BIOPSIES AND POLYPECTOMY     ENDARTERECTOMY Left 02/22/2023   Procedure: LEFT ENDARTERECTOMY CAROTID;  Surgeon: Serene Gaile ORN, MD;  Location: [facility] OR;  Service: Vascular;  Laterality: Left;   LOWER EXTREMITY ANGIOGRAM Left 12/26/2011   Procedure: LOWER EXTREMITY ANGIOGRAM;  Surgeon: Gaile ORN Serene, MD;  Location: [facility] CATH [facility];  Service: Cardiovascular;  Laterality: Left;   LOWER EXTREMITY ANGIOGRAM Left 08/17/2017   Procedure: LOWER EXTREMITY ANGIOGRAM LEFT LEG WITH RUNOFF AND Stenting.;  Surgeon: Serene Gaile ORN, MD;  Location: [facility] OR;  Service: Vascular;  Laterality: Left;   MULTIPLE TOOTH EXTRACTIONS     OTHER SURGICAL HISTORY     left left with stents (Dr. Celso)   OTHER SURGICAL HISTORY     2003 colonoscopy 5 mm polyp transverse colon; (2)44mm  polyps in rectum-hyperplastic   PATCH ANGIOPLASTY Left 02/22/2023   Procedure: PATCH ANGIOPLASTY 1CMx14CM GEORGE LISLE;  Surgeon: Serene Gaile ORN, MD;  Location: [facility] OR;  Service: Vascular;  Laterality: Left;   PERIPHERAL VASCULAR INTERVENTION Left 08/17/2017   Procedure: POPLITEAL STENT;  Surgeon: Serene Gaile ORN, MD;  Location: [facility] OR;  Service: Vascular;  Laterality: Left;   Social History   Occupational History   Occupation: retired  Tobacco Use   Smoking status: Former    Current packs/day: 0.00    Types: Cigarettes    Quit date: 1967    Years since quitting: 58.9    Passive exposure: Never   Smokeless tobacco: Former    Types: Chew    Quit date: 1980  Vaping Use   Vaping status: Never Used  Substance and Sexual Activity   Alcohol use: No    Alcohol/week: 0.0 standard drinks of alcohol   Drug use: No   Sexual activity: Yes    Partners: Male    Comment:  declined condoms

## 2024-03-21 ENCOUNTER — Encounter: Payer: Self-pay | Admitting: Student

## 2024-03-21 ENCOUNTER — Ambulatory Visit: Payer: Medicare (Managed Care) | Admitting: Student

## 2024-03-21 VITALS — BP 147/71 | HR 70 | Temp 97.6°F | Ht 73.0 in | Wt 187.6 lb

## 2024-03-21 DIAGNOSIS — I10 Essential (primary) hypertension: Secondary | ICD-10-CM

## 2024-03-21 DIAGNOSIS — L03818 Cellulitis of other sites: Secondary | ICD-10-CM

## 2024-03-21 DIAGNOSIS — Z7985 Long-term (current) use of injectable non-insulin antidiabetic drugs: Secondary | ICD-10-CM

## 2024-03-21 DIAGNOSIS — Z794 Long term (current) use of insulin: Secondary | ICD-10-CM | POA: Diagnosis not present

## 2024-03-21 DIAGNOSIS — Z7984 Long term (current) use of oral hypoglycemic drugs: Secondary | ICD-10-CM

## 2024-03-21 DIAGNOSIS — E1151 Type 2 diabetes mellitus with diabetic peripheral angiopathy without gangrene: Secondary | ICD-10-CM | POA: Diagnosis not present

## 2024-03-21 LAB — POCT GLYCOSYLATED HEMOGLOBIN (HGB A1C): HbA1c, POC (controlled diabetic range): 8.4 % — AB (ref 0.0–7.0)

## 2024-03-21 LAB — GLUCOSE, CAPILLARY: Glucose-Capillary: 156 mg/dL — ABNORMAL HIGH (ref 70–99)

## 2024-03-21 MED ORDER — VALSARTAN-HYDROCHLOROTHIAZIDE 160-25 MG PO TABS
1.0000 | ORAL_TABLET | Freq: Every day | ORAL | 3 refills | Status: AC
Start: 2024-03-21 — End: ?

## 2024-03-21 NOTE — Progress Notes (Signed)
 CC: Abscess follow up   HPI:  [name] is a 73 y.o. male living with a history stated below and presents today for a follow up regarding his recurrent abscesses. Please see problem based assessment and plan for additional details.  Discussed the use of AI scribe software for clinical note transcription with the patient, who gave verbal consent to proceed.  History of Present Illness Nathan [name] is a 73 year old male with hypertension and diabetes who presents with elevated blood pressure and recent infection.  He has experienced elevated blood pressure for the past three to four weeks, with systolic readings ranging from 140 to 160 mmHg. Previously, his blood pressure was lower, but it has recently increased despite taking lisinopril  once daily at night. He monitors his blood pressure at various times during the day.  He recently completed a course of clindamycin 300 mg three times a day for seven days to treat an infection characterized by boils, which have since resolved. Infectious disease specialists confirmed there was no MRSA. He reports significant improvement and believes the boils are gone.  He sustained an arm injury from a fall and was seen by Dr. Jerri, who placed him in a cast.  Regarding diabetes management, he has not seen his endocrinologist in about a year and has been managing his condition with the help of [facility]. He attempted to refill a diabetes-related patch but was denied, possibly due to not having seen his endocrinologist recently. He does not have an upcoming appointment with his endocrinologist.  He recalls being hospitalized in July for a rash, which marked the beginning of his recent health issues. He was discharged and subsequently readmitted to the hospital.  He takes all his medications as prescribed, although he does not specify the complete list of medications. He has an upcoming appointment with [facility] on the 25th of this  month.     Past Medical History:  Diagnosis Date   Asthma    per 2003 [facility] pulm records pfts    Blood dyscrasia    HIV   Carotid artery occlusion    Clotting disorder    Colon polyps    noted previous colonoscopy [facility]   DDD (degenerative disc disease)    cervical spine   Depression    Diabetes mellitus    dx 2010   Diarrhea 02/24/2021   Dizziness 08/25/2021   Falls 08/25/2021   Fever    unknwon origin   GERD (gastroesophageal reflux disease)    Head swelling 05/23/2017   Hep C w/o coma, chronic (HCC)    History of syphilis    noted [facility] records   HIV infection (HCC)    undetectable viral load and CD4 ct 667 as of 11/2011   Hyperlipidemia    Hypertension    MRSA (methicillin resistant Staphylococcus aureus)    Multiple sclerosis    in remission as of 11/2011 (diagnosed late 1980s)   Multiple sclerosis 02/12/2006   Unclear where this diagnosis came from.  Our earliest note is from Dr. Neomi Essex in 2008 and lists remote hx of multiple sclerosis.       Pain in limb-Right Leg 10/13/2013   Pancreas (digestive gland) works poorly    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HCC)    2002   Pneumonia    Prosthesis fitting 03/10/2019   PVD (peripheral vascular disease)    Left Stent 07/02/2008   Scalp lesion 07/26/2017   UTI (urinary tract infection)  Wears dentures     Current Outpatient Medications on File Prior to Visit  Medication Sig Dispense Refill   acetaminophen  (TYLENOL ) 500 MG tablet Take 2 tablets (1,000 mg total) by mouth every 6 (six) hours. 30 tablet 0   atorvastatin  (LIPITOR) 40 MG tablet TAKE 1 TABLET(40 MG) BY MOUTH DAILY 90 tablet 1   bictegravir-emtricitabine -tenofovir  AF (BIKTARVY ) 50-200-25 MG TABS tablet Take 1 tablet by mouth daily. 30 tablet 11   Blood Glucose Monitoring Suppl (FREESTYLE LITE) w/Device KIT 1 Device by Does not apply route daily in the afternoon. 1 kit 0   chlorhexidine  (HIBICLENS ) 4 % external liquid Apply topically daily.  Shower  with chlorhexidine  every day below neck 150 mL 1   cilostazol  (PLETAL ) 50 MG tablet TAKE 1 TABLET(50 MG) BY MOUTH TWICE DAILY 180 tablet 3   clindamycin (CLEOCIN) 300 MG capsule Take 1 capsule (300 mg total) by mouth 3 (three) times daily. 21 capsule 0   clopidogrel  (PLAVIX ) 75 MG tablet TAKE 1 TABLET(75 MG) BY MOUTH DAILY 90 tablet 3   clotrimazole  (LOTRIMIN ) 1 % cream Apply 1 Application topically 2 (two) times daily. 30 g 0   Continuous Glucose Sensor (FREESTYLE LIBRE 3 SENSOR) MISC APPLY 1 SENSOR TO SKIN EVERY 14 DAYS. USE TO CHECK GLUCOSE CONTINUOUSLY 6 each 3   empagliflozin  (JARDIANCE ) 25 MG TABS tablet Take 1 tablet (25 mg total) by mouth daily. 90 tablet 3   ezetimibe  (ZETIA ) 10 MG tablet TAKE 1 TABLET(10 MG) BY MOUTH DAILY 90 tablet 2   glucose blood (FREESTYLE LITE) test strip Use as instructed 100 each 12   glucose blood (FREESTYLE LITE) test strip Use 5 times daily to check blood sugar. 450 each 3   HUMALOG  100 UNIT/ML injection Use 35 units with breakfast and 30 units with your other main meals of the day     injection device for insulin  (CEQUR SIMPLICITY 2U) DEVI change every 2 days 45 each 3   insulin  glargine, 2 Unit Dial, (TOUJEO  MAX SOLOSTAR) 300 UNIT/ML Solostar Pen Inject 50 Units into the skin in the morning.     Insulin  Pen Needle (BD PEN NEEDLE NANO U/F) 32G X 4 MM MISC 1 Device by Other route daily in the afternoon. USE AS DIRECTED FOUR TIMES DAILY. Dx code  E11.42 100 each 3   Insulin  Syringe-Needle U-100 (GNP INSULIN  SYRINGES) 30G X 5/16 1 ML MISC 1 Device by Does not apply route daily in the afternoon. 100 each 3   Lancet Devices (BAYER MICROLET 2 LANCING DEVIC) MISC Use to check blood sugar up to 3 ties a day 1 each 2   Lancets (FREESTYLE) lancets Use 5 times daily to check blood sugar. 450 each 3   linezolid  (ZYVOX ) 600 MG tablet Take 1 tablet (600 mg total) by mouth every 12 (twelve) hours for 6 days. 12 tablet 0   lipase/protease/amylase (CREON ) 36000 UNITS  CPEP capsule TAKE 1 CAPSULE BY MOUTH THREE TIMES DAILY BEFORE A MEAL 200 capsule 3   mupirocin  ointment (BACTROBAN ) 2 % Apply 1 Application topically 2 (two) times daily. 22 g 1   omeprazole  (PRILOSEC) 40 MG capsule TAKE 1 CAPSULE(40 MG) BY MOUTH DAILY 90 capsule 1   pioglitazone  (ACTOS ) 30 MG tablet TAKE 1 TABLET(30 MG) BY MOUTH DAILY 90 tablet 3   tirzepatide  (MOUNJARO ) 15 MG/0.5ML Pen Inject 15 mg into the skin once a week. 2 mL 3   No current facility-administered medications on file prior to visit.    Family History  Problem Relation Age of Onset   Diabetes Mother    Coronary artery disease Mother    Coronary artery disease Father    Liver disease Sister    Cancer Brother        colon caner stage 4 as of 11/2011 (unknown age of onset)   Prostate cancer Brother    Colon cancer Brother    Rectal cancer Neg Hx    Esophageal cancer Neg Hx     Social History   Socioeconomic History   Marital status: Single    Spouse name: Not on file   Number of children: 1   Years of education: 12   Highest education level: Not on file  Occupational History   Occupation: retired  Tobacco Use   Smoking status: Former    Current packs/day: 0.00    Types: Cigarettes    Quit date: 1967    Years since quitting: 58.9    Passive exposure: Never   Smokeless tobacco: Former    Types: Chew    Quit date: 1980  Vaping Use   Vaping status: Never Used  Substance and Sexual Activity   Alcohol use: No    Alcohol/week: 0.0 standard drinks of alcohol   Drug use: No   Sexual activity: Yes    Partners: Male    Comment: declined condoms  Other Topics Concern   Not on file  Social History Narrative   Not on file   Social Drivers of Health   Financial Resource Strain: Low Risk  (04/04/2022)   Overall Financial Resource Strain (CARDIA)    Difficulty of Paying Living Expenses: Not hard at all  Food Insecurity: No Food Insecurity (11/27/2023)   Hunger Vital Sign    Worried About Running Out of  Food in the Last Year: Never true    Ran Out of Food in the Last Year: Never true  Transportation Needs: No Transportation Needs (11/27/2023)   PRAPARE - Administrator, Civil Service (Medical): No    Lack of Transportation (Non-Medical): No  Physical Activity: Insufficiently Active (04/04/2022)   Exercise Vital Sign    Days of Exercise per Week: 1 day    Minutes of Exercise per Session: 20 min  Stress: No Stress Concern Present (04/04/2022)   [employer] of Occupational Health - Occupational Stress Questionnaire    Feeling of Stress : Not at all  Social Connections: Socially Isolated (11/27/2023)   Social Connection and Isolation Panel    Frequency of Communication with Friends and Family: Never    Frequency of Social Gatherings with Friends and Family: Once a week    Attends Religious Services: Never    Database Administrator or Organizations: No    Attends Banker Meetings: Never    Marital Status: Divorced  Catering Manager Violence: Not At Risk (11/27/2023)   Humiliation, Afraid, Rape, and Kick questionnaire    Fear of Current or Ex-Partner: No    Emotionally Abused: No    Physically Abused: No    Sexually Abused: No    Review of Systems: ROS negative except for what is noted on the assessment and plan.  Vitals:   03/21/24 0942 03/21/24 1014  BP: (!) 166/57 (!) 147/71  Pulse: 68 70  Temp: 97.6 F (36.4 C)   TempSrc: Oral   SpO2: 100%   Weight: 187 lb 9.6 oz (85.1 kg)   Height: 6' 1 (1.854 m)     Physical Exam: Constitutional: well-appearing male  in  no acute distress Cardiovascular: regular rate and rhythm, no m/r/g Pulmonary/Chest: normal work of breathing on room air, lungs clear to auscultation bilaterally Skin: warm and dry, well appearing healing site of previous boils in axilla Psych: normal mood and affect  Assessment & Plan:   Benign essential HTN Blood pressure elevated at home for 3-4 weeks, systolic 140-160 mmHg.  Lisinopril  ineffective, requires adjustment. - Discontinued lisinopril . - Initiated combination antihypertensive once daily with Diovan 160-25mg  - Recheck blood pressure in one month.  Type 2 DM with diabetic peripheral angiopathy w/o gangrene (HCC) Last A1c elevated in July, improved today at 8.4.  - Provided endocrinologist contact for follow-up. - On an extensive regimen of: Toujeo  50U, Humalog  30U TID, Actos  30mg , Jardiance  25mg , and Mounjaro  15mg    Cellulitis Cellulitis resolved, no MRSA, completed clindamycin course. Has ID follow up on 03/26/24.    Patient discussed with Dr. Shawn Dirks Allyssa Abruzzese, M.D. [facility], PGY-3 Pager: [phone] Date 03/21/2024 Time 2:19 PM

## 2024-03-21 NOTE — Assessment & Plan Note (Signed)
 Cellulitis resolved, no MRSA, completed clindamycin course. Has ID follow up on 03/26/24.

## 2024-03-21 NOTE — Assessment & Plan Note (Signed)
 Last A1c elevated in July, improved today at 8.4.  - Provided endocrinologist contact for follow-up. - On an extensive regimen of: Toujeo  50U, Humalog  30U TID, Actos  30mg , Jardiance  25mg , and Mounjaro  15mg 

## 2024-03-21 NOTE — Patient Instructions (Addendum)
 Thank you so much for coming to the clinic today!   We are checking your A1c today, I'll give you a call with the results.  We are also changing your medications. Please STOP taking the lisinopril , and we are starting you on a new medication called Diovan.  The number for the endocrinologist is listed below.    [facility] [address] [place], [place] [address] [phone]   If you have any questions please feel free to the call the clinic at anytime at [phone]. It was a pleasure seeing you!  Best, Dr. Bookert Guzzi

## 2024-03-21 NOTE — Assessment & Plan Note (Signed)
 Blood pressure elevated at home for 3-4 weeks, systolic 140-160 mmHg. Lisinopril  ineffective, requires adjustment. - Discontinued lisinopril . - Initiated combination antihypertensive once daily with Diovan 160-25mg  - Recheck blood pressure in one month.

## 2024-03-21 NOTE — Progress Notes (Signed)
 Internal Medicine Clinic Attending  Case discussed with the resident at the time of the visit.  We reviewed the resident's history and exam and pertinent patient test results.  I agree with the assessment, diagnosis, and plan of care documented in the resident's note.

## 2024-03-24 ENCOUNTER — Ambulatory Visit: Payer: Self-pay | Admitting: Student

## 2024-03-26 ENCOUNTER — Ambulatory Visit: Payer: Medicare (Managed Care) | Admitting: Infectious Diseases

## 2024-04-01 ENCOUNTER — Other Ambulatory Visit: Payer: Self-pay | Admitting: Dietician

## 2024-04-01 ENCOUNTER — Ambulatory Visit: Payer: Medicare (Managed Care) | Admitting: Dietician

## 2024-04-01 VITALS — Wt 186.8 lb

## 2024-04-01 DIAGNOSIS — E1169 Type 2 diabetes mellitus with other specified complication: Secondary | ICD-10-CM

## 2024-04-01 DIAGNOSIS — E119 Type 2 diabetes mellitus without complications: Secondary | ICD-10-CM | POA: Diagnosis not present

## 2024-04-01 DIAGNOSIS — Z794 Long term (current) use of insulin: Secondary | ICD-10-CM

## 2024-04-01 DIAGNOSIS — E1151 Type 2 diabetes mellitus with diabetic peripheral angiopathy without gangrene: Secondary | ICD-10-CM

## 2024-04-01 MED ORDER — INSULIN PEN NEEDLE 32G X 4 MM MISC: 3 refills | Status: DC

## 2024-04-01 MED ORDER — CEQUR SIMPLICITY 2U DEVI: 3 refills | Status: DC

## 2024-04-01 NOTE — Patient Instructions (Addendum)
 Thank you for your visit today!  What we talked about:    Next A1c due end of January  A1c dropped this month to 8.4%  Goal is  around 7% (average about 155)  I will ask Chilon next week about the foot doctor diabetes shoes and call you with that information.   I will ask for refills on pen needles and Cecur simplicity.    I think you may be having absorption issues with your insulin  injections.  1- Be sure to rotate where you give injections ( do not give injection ear lumps, bumps or red spots 2- use a new needle every time 3- Be sure to put the needle all the way in  You can consider Lyumjev  (souped up Humalog )   Let's follow up in February - I will need to call you with an appointment.  Please feel free to call me anytime.  Marissah Vandemark [phone]

## 2024-04-01 NOTE — Telephone Encounter (Signed)
 Refill request

## 2024-04-01 NOTE — Progress Notes (Signed)
 Diabetes Self-Management Education  Visit Type: Annual Follow-Up  Appt. Start Time: 118 Appt. End Time: 218  04/01/2024  [name], identified by name and date of birth, is a 73 y.o. male with a diagnosis of Diabetes:  SABRA Type 2  ASSESSMENT   Patient with recent fall and wrist fracture making caring for his diabetes more challenging. He is taking about 100-118 units of lispro per day and 55 units toujeo , rec  change to Lyumjev .  Weight 186 lb 12.8 oz (84.7 kg). Body mass index is 24.65 kg/m. Wt Readings from Last 10 Encounters:  04/01/24 186 lb 12.8 oz (84.7 kg)  03/21/24 187 lb 9.6 oz (85.1 kg)  03/11/24 188 lb (85.3 kg)  03/05/24 188 lb (85.3 kg)  03/03/24 188 lb 8 oz (85.5 kg)  02/21/24 187 lb 12.8 oz (85.2 kg)  02/07/24 187 lb (84.8 kg)  01/31/24 186 lb 3.2 oz (84.5 kg)  12/25/23 184 lb 6.4 oz (83.6 kg)  12/18/23 185 lb (83.9 kg)   Lab Results  Component Value Date   HGBA1C 8.4 (A) 03/21/2024   HGBA1C 9.5 (H) 12/04/2023   HGBA1C 8.6 (A) 08/01/2023   HGBA1C 8.3 (A) 05/25/2023   HGBA1C 8.2 (A) 01/01/2023    BP Readings from Last 3 Encounters:  03/21/24 (!) 147/71  03/11/24 (!) 151/81  03/05/24 (!) 156/71      Diabetes Self-Management Education - 04/01/24 1400       Visit Information   Visit Type Annual Follow-Up      Health Coping   How would you rate your overall health? Good   he had a difficult time with this question     Psychosocial Assessment   Patient Belief/Attitude about Diabetes Motivated to manage diabetes    What is the hardest part about your diabetes right now, causing you the most concern, or is the most worrisome to you about your diabetes?   Other (comment)   he worries that tkaing large amounts of insulin  may damage his kidneys   Self-care barriers Lack of material resources;Unsteady gait/risk for falls    Self-management support Doctor's office;CDE visits    Patient Concerns Support;Glycemic Control    Special Needs None     Preferred Learning Style No preference indicated    Learning Readiness Contemplating    How often do you need to have someone help you when you read instructions, pamphlets, or other written materials from your doctor or pharmacy? 2 - Rarely   not ready to change food but is ready and wiling to change nedications   What is the last grade level you completed in school? 12      Pre-Education Assessment   Patient understands using medications safely. Needs Review    Patient understands monitoring blood glucose, interpreting and using results Needs Review      Complications   Last HgB A1C per patient/outside source 8.4 %    How often do you check your blood sugar? > 4 times/day   uses freestyle libre 3 CGM   Fasting Blood glucose range (mg/dL) 869-820    Postprandial Blood glucose range (mg/dL) >799    Number of hypoglycemic episodes per month 0    Number of hyperglycemic episodes ( >200mg /dL): Daily    Can you tell when your blood sugar is high? Yes    What do you do if your blood sugar is high? sleepy and hungry, urinates a lot    Have you had a dilated eye exam in the  past 12 months? Yes    Have you had a dental exam in the past 12 months? --   deferred until he figures out where to get diabetes shoes   Are you checking your feet? Yes    How many days per week are you checking your feet? 7      Dietary Intake   Breakfast 16 ounce cup of cold sweet cereal no milk, coffee with no sugar creamer    Dinner 2 red barron pepperoni pizza bowls, diet mt dew   96 g carb, 44 g fat, 32 g protein for both   Snack (evening) birthday cake    Beverage(s) coffee, water, diet cola and mt. dew      Activity / Exercise   Activity / Exercise Type ADL's;Light (walking / raking leaves)   works in yard, has noticed when he does this his blood sugar is lower   How many days per week do you exercise? 30    How many minutes per day do you exercise? 5    Total minutes per week of exercise 150      Patient  Education   Previous Diabetes Education Yes    Medications Taught/reviewed insulin /injectables, injection, site rotation, insulin /injectables storage and needle disposal.;Reviewed patients medication for diabetes, action, purpose, timing of dose and side effects.   has red spots where he is injecting Toujeo , he is not pinching up because of his broken right wrist. he will begin pinching up again when cast is removed. he thinks the red spots are from not putting the needle in far enough and inejcting into skin   Monitoring Taught/evaluated CGM (comment)   reveiwed CGM report with patient. he states he is taking all his meds diabetes consistently yet has variable blood sugars. had two dizzy spells in last 2 weeks, but none show on CGM reports. He does not think they were due to low blood sugars.     Individualized Goals (developed by patient)   Medications Other (comment)   use new needle and pinch up when injecting; be sure to roatate sites.     Post-Education Assessment   Patient understands using medications safely. Comphrehends key points    Patient understands monitoring blood glucose, interpreting and using results Comprehends key points      Outcomes   Expected Outcomes Demonstrated interest in learning but significant barriers to change    Future DMSE 3-4 months    Program Status Not Completed      Subsequent Visit   Since your last visit have you continued or begun to take your medications as prescribed? Yes   forgot to take lisinopril  for a few days before his recent appointment with Dr. Nooruddin. is tkaing 55 units Toujeo  instead of 50 units.   Since your last visit have you had your blood pressure checked? Yes   he checks at home and it was 116/50s this am   Is your most recent blood pressure lower, unchanged, or higher since your last visit? Lower    Since your last visit have you experienced any weight changes? No change    Since your last visit, are you checking your blood glucose  at least once a day? Yes   using freestyle libre CGM         Individualized Plan for Diabetes Self-Management Training:   Learning Objective:  Patient will have a greater understanding of diabetes self-management. Patient education plan is to attend individual and/or group sessions per assessed needs and  concerns.   Plan:   Patient Instructions  Thank you for your visit today!  What we talked about:    Next A1c due end of January  A1c dropped this month to 8.4%  Goal is  around 7% (average about 155)  I will ask Chilon next week about the foot doctor diabetes shoes and call you with that information.   I will ask for refills on pen needles and Cecur simplicity.    I think you may be having absorption issues with your insulin  injections.  1- Be sure to rotate where you give injections ( do not give injection ear lumps, bumps or red spots 2- use a new needle every time 3- Be sure to put the needle all the way in  You can consider Lyumjev  (souped up Humalog )   Let's follow up in February - I will need to call you with an appointment.  Please feel free to call me anytime.  Arland [phone]   Expected Outcomes:  Demonstrated interest in learning but significant barriers to change  Education material provided: Diabetes Resources  If problems or questions, patient to contact team via:  Phone  Future DSME appointment: 3-4 months Arland Hole, RD 04/01/2024 3:13 PM.

## 2024-04-07 ENCOUNTER — Telehealth: Payer: Self-pay

## 2024-04-07 NOTE — Telephone Encounter (Signed)
 Jettson Crable (Key: [id number]) Need Help? Call us  at [phone] Outcome Additional Information Required Clinical Override not needed Drug CeQur Simplicity 2U ePA cloud logo Form [employer] [facility] Medicare Electronic PA Form [phone] NCPDP)  Walterine Gammons, what does this normally mean? Is there anything else I need to do?

## 2024-04-07 NOTE — Telephone Encounter (Signed)
 Prior Authorization for patient Architectural Technologist 2U) came through on cover my meds was submitted awaiting approval or denial.  KEY:[id number]

## 2024-04-09 ENCOUNTER — Telehealth: Payer: Self-pay | Admitting: Dietician

## 2024-04-09 ENCOUNTER — Other Ambulatory Visit (HOSPITAL_COMMUNITY): Payer: Self-pay

## 2024-04-09 NOTE — Telephone Encounter (Signed)
 Called patient and left a message with information on local supplier of diabetic shoes:  Nathan [name]: [address] [place], [place] [address]; Phone: [phone]

## 2024-04-09 NOTE — Telephone Encounter (Signed)
 Thank you :)

## 2024-04-10 ENCOUNTER — Other Ambulatory Visit (INDEPENDENT_AMBULATORY_CARE_PROVIDER_SITE_OTHER): Payer: Medicare (Managed Care)

## 2024-04-10 ENCOUNTER — Ambulatory Visit: Payer: Medicare (Managed Care) | Admitting: Orthopaedic Surgery

## 2024-04-10 DIAGNOSIS — S52551A Other extraarticular fracture of lower end of right radius, initial encounter for closed fracture: Secondary | ICD-10-CM

## 2024-04-10 NOTE — Progress Notes (Signed)
 Office Visit Note   Patient: Nathan [name]           Date of Birth: 1950/11/29           MRN: [id number] Visit Date: 04/10/2024              Requested by: Nooruddin, Saad, MD [address] [place], [address] [place],  [place] [address] PCP: Nooruddin, Saad, MD   Assessment & Plan: Visit Diagnoses:  1. Other closed extra-articular fracture of distal end of right radius, initial encounter     Plan: 73 year old gentleman with right distal radius fracture.  Discontinue cast and transition to Velcro splint.  His range of motion is quite good so I do not think he needs any formal OT.  Follow-up in 4 weeks with repeat imaging.  Follow-Up Instructions: Return in about 4 weeks (around 05/08/2024).   Orders:  Orders Placed This Encounter  Procedures   XR Wrist 2 Views Right   No orders of the defined types were placed in this encounter.     Procedures: No procedures performed   Clinical Data: No additional findings.   Subjective: Chief Complaint  Patient presents with   Right Wrist - Injury    DOI 03/11/2024    HPI Patient is following up for right distal radius fracture.  Injury was approximately 4 weeks ago.  He reports no pain. Review of Systems  Constitutional: Negative.   HENT: Negative.    Eyes: Negative.   Respiratory: Negative.    Cardiovascular: Negative.   Gastrointestinal: Negative.   Endocrine: Negative.   Genitourinary: Negative.   Skin: Negative.   Allergic/Immunologic: Negative.   Neurological: Negative.   Hematological: Negative.   Psychiatric/Behavioral: Negative.    All other systems reviewed and are negative.    Objective: Vital Signs: There were no vitals taken for this visit.  Physical Exam Vitals and nursing note reviewed.  Constitutional:      Appearance: He is well-developed.  HENT:     Head: Normocephalic and atraumatic.  Eyes:     Pupils: Pupils are equal, round, and reactive to light.  Pulmonary:     Effort: Pulmonary effort is  normal.  Abdominal:     Palpations: Abdomen is soft.  Musculoskeletal:        General: Normal range of motion.     Cervical back: Neck supple.  Skin:    General: Skin is warm.  Neurological:     Mental Status: He is alert and oriented to person, place, and time.  Psychiatric:        Behavior: Behavior normal.        Thought Content: Thought content normal.        Judgment: Judgment normal.     Ortho Exam Exam of the right wrist shows decent range of motion without pain.  Minimal swelling. Specialty Comments:  No specialty comments available.  Imaging: XR Wrist 2 Views Right Result Date: 04/10/2024 X-rays of the right wrist show evidence of fracture healing of the distal radius.    PMFS History: Patient Active Problem List   Diagnosis Date Noted   Cellulitis 02/22/2024   Antiplatelet or antithrombotic long-term use 06/20/2022   Pancreatic insufficiency 06/04/2022   Dyslipidemia 09/12/2021   Tobacco use disorder, moderate, in sustained remission 08/30/2020   Type 2 DM with diabetic peripheral angiopathy w/o gangrene (HCC) 02/19/2019   History of syphilis 10/09/2013   Atherosclerosis of native artery of extremity with intermittent claudication 04/08/2012   Carotid artery stenosis  12/11/2011   Healthcare maintenance 11/23/2011   Benign essential HTN 02/13/2008   Hyperlipidemia associated with type 2 diabetes mellitus (HCC) 07/29/2007   HIV disease (HCC) 02/12/2006   COLONIC POLYPS, ADENOMATOUS 02/12/2006   PAD (peripheral artery disease) 02/12/2006   GERD 02/12/2006   Hepatitis B core antibody positive 02/12/2006   Past Medical History:  Diagnosis Date   Asthma    per 2003 [facility] pulm records pfts    Blood dyscrasia    HIV   Carotid artery occlusion    Clotting disorder    Colon polyps    noted previous colonoscopy [facility]   DDD (degenerative disc disease)    cervical spine   Depression    Diabetes mellitus    dx 2010   Diarrhea 02/24/2021   Dizziness  08/25/2021   Falls 08/25/2021   Fever    unknwon origin   GERD (gastroesophageal reflux disease)    Head swelling 05/23/2017   Hep C w/o coma, chronic (HCC)    History of syphilis    noted [facility] records   HIV infection (HCC)    undetectable viral load and CD4 ct 667 as of 11/2011   Hyperlipidemia    Hypertension    MRSA (methicillin resistant Staphylococcus aureus)    Multiple sclerosis    in remission as of 11/2011 (diagnosed late 1980s)   Multiple sclerosis 02/12/2006   Unclear where this diagnosis came from.  Our earliest note is from Dr. Neomi Essex in 2008 and lists remote hx of multiple sclerosis.       Pain in limb-Right Leg 10/13/2013   Pancreas (digestive gland) works poorly    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HCC)    2002   Pneumonia    Prosthesis fitting 03/10/2019   PVD (peripheral vascular disease)    Left Stent 07/02/2008   Scalp lesion 07/26/2017   UTI (urinary tract infection)    Wears dentures     Family History  Problem Relation Age of Onset   Diabetes Mother    Coronary artery disease Mother    Coronary artery disease Father    Liver disease Sister    Cancer Brother        colon caner stage 4 as of 11/2011 (unknown age of onset)   Prostate cancer Brother    Colon cancer Brother    Rectal cancer Neg Hx    Esophageal cancer Neg Hx     Past Surgical History:  Procedure Laterality Date   ABDOMINAL AORTOGRAM W/LOWER EXTREMITY N/A 08/07/2017   Procedure: ABDOMINAL AORTOGRAM W/LOWER EXTREMITY;  Surgeon: Serene Gaile ORN, MD;  Location: [facility] INVASIVE CV [facility];  Service: Cardiovascular;  Laterality: N/A;   ABOVE KNEE LEG AMPUTATION  2009   Right Leg   COLONOSCOPY W/ BIOPSIES AND POLYPECTOMY     ENDARTERECTOMY Left 02/22/2023   Procedure: LEFT ENDARTERECTOMY CAROTID;  Surgeon: Serene Gaile ORN, MD;  [facility];  Service: Vascular;  Laterality: Left;   LOWER EXTREMITY ANGIOGRAM Left 12/26/2011   Procedure: LOWER EXTREMITY ANGIOGRAM;  Surgeon: Gaile ORN Serene, MD;  Location: [facility] CATH LAB;  Service: Cardiovascular;  Laterality: Left;   LOWER EXTREMITY ANGIOGRAM Left 08/17/2017   Procedure: LOWER EXTREMITY ANGIOGRAM LEFT LEG WITH RUNOFF AND Stenting.;  Surgeon: Serene Gaile ORN, MD;  Location: [facility] OR;  Service: Vascular;  Laterality: Left;   MULTIPLE TOOTH EXTRACTIONS     OTHER SURGICAL HISTORY     left left with stents (Dr. Celso)   OTHER SURGICAL HISTORY  2003 colonoscopy 5 mm polyp transverse colon; (2)19mm  polyps in rectum-hyperplastic   PATCH ANGIOPLASTY Left 02/22/2023   Procedure: PATCH ANGIOPLASTY 1CMx14CM GEORGE LISLE;  Surgeon: Serene Gaile ORN, MD;  Location: [facility] OR;  Service: Vascular;  Laterality: Left;   PERIPHERAL VASCULAR INTERVENTION Left 08/17/2017   Procedure: POPLITEAL STENT;  Surgeon: Serene Gaile ORN, MD;  Location: [facility] OR;  Service: Vascular;  Laterality: Left;   Social History   Occupational History   Occupation: retired  Tobacco Use   Smoking status: Former    Current packs/day: 0.00    Types: Cigarettes    Quit date: 1967    Years since quitting: 58.9    Passive exposure: Never   Smokeless tobacco: Former    Types: Chew    Quit date: 1980  Vaping Use   Vaping status: Never Used  Substance and Sexual Activity   Alcohol use: No    Alcohol/week: 0.0 standard drinks of alcohol   Drug use: No   Sexual activity: Yes    Partners: Male    Comment: declined condoms

## 2024-04-14 ENCOUNTER — Other Ambulatory Visit: Payer: Self-pay

## 2024-04-14 MED ORDER — FREESTYLE LITE TEST VI STRP: ORAL_STRIP | 12 refills | Status: AC

## 2024-04-21 ENCOUNTER — Ambulatory Visit: Payer: Medicare (Managed Care) | Admitting: Student

## 2024-04-21 ENCOUNTER — Encounter: Payer: Self-pay | Admitting: Student

## 2024-04-21 VITALS — BP 155/72 | HR 73 | Temp 97.4°F | Ht 73.0 in | Wt 190.6 lb

## 2024-04-21 DIAGNOSIS — I10 Essential (primary) hypertension: Secondary | ICD-10-CM

## 2024-04-21 NOTE — Progress Notes (Signed)
 CC: BP follow up  HPI:  [name] is a 73 y.o. male with a PMH stated below who presents today for blood pressure assessment after changing from lisinopril  to valsartain-hydrochlorothiazide  last month. He has no concerns or symptoms.  Please see problem based assessment and plan for additional details.  Past Medical History:  Diagnosis Date   Asthma    per 2003 [facility] pulm records pfts    Blood dyscrasia    HIV   Carotid artery occlusion    Clotting disorder    Colon polyps    noted previous colonoscopy [facility]   DDD (degenerative disc disease)    cervical spine   Depression    Diabetes mellitus    dx 2010   Diarrhea 02/24/2021   Dizziness 08/25/2021   Falls 08/25/2021   Fever    unknwon origin   GERD (gastroesophageal reflux disease)    Head swelling 05/23/2017   Hep C w/o coma, chronic (HCC)    History of syphilis    noted [facility] records   HIV infection (HCC)    undetectable viral load and CD4 ct 667 as of 11/2011   Hyperlipidemia    Hypertension    MRSA (methicillin resistant Staphylococcus aureus)    Multiple sclerosis    in remission as of 11/2011 (diagnosed late 1980s)   Multiple sclerosis 02/12/2006   Unclear where this diagnosis came from.  Our earliest note is from Dr. Neomi Essex in 2008 and lists remote hx of multiple sclerosis.       Pain in limb-Right Leg 10/13/2013   Pancreas (digestive gland) works poorly    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HCC)    2002   Pneumonia    Prosthesis fitting 03/10/2019   PVD (peripheral vascular disease)    Left Stent 07/02/2008   Scalp lesion 07/26/2017   UTI (urinary tract infection)    Wears dentures    Review of Systems: ROS negative except for what is noted on the assessment and plan.  Vitals:   04/21/24 1020 04/21/24 1025  BP: (!) 160/76 (!) 155/72  Pulse: 72 73  Temp: (!) 97.4 F (36.3 C)   TempSrc: Oral   SpO2: 92%   Weight: 190 lb 9.6 oz (86.5 kg)   Height: 6' 1 (1.854 m)     Physical Exam: Constitutional: well-appearing man in no acute distress HENT: normocephalic atraumatic, mucous membranes moist Eyes: conjunctiva non-erythematous Cardiovascular: regular rate and rhythm, no m/r/g Pulmonary/Chest: normal work of breathing on room air, lungs clear to auscultation bilaterally Abdominal: soft, non-tender, non-distended MSK: normal bulk and tone. R AKA with prosthesis is present. Neurological: alert & oriented x 3, no focal deficit Skin: warm and dry Psych: normal mood and behavior  Assessment & Plan:   Patient discussed with Dr. Lovie  Benign essential HTN This is a 1 month blood pressure follow-up visit.  He is without symptoms or complaints.  Last month, lisinopril  was stopped in favor of valsartan -hydrochlorothiazide  160-25 daily with elevated office and home measurements.  Today blood pressure is high at 155/72.  However, he checks his blood pressure at home with a machine that has been appropriately calibrated and measurements are usually 110-130 systolic and 60-70 diastolic.  If anything, his blood pressure runs a little low, however he denies significant orthostatic symptoms.  He does note that he discovered after last month's visit that he had missed his lisinopril  for a couple of weeks.  In any case, he is agreeable to continue with his  current regimen.  -Continue valsartan -hydrochlorothiazide  160-25 tablet daily. - Will collect BMP today to assess for renal and electrolyte abnormalities - RTC in 5 months for general check  Lonni Africa, D.O. [facility] Internal Medicine, PGY-2 Phone: [phone] Date 04/21/2024 Time 11:20 AM

## 2024-04-21 NOTE — Assessment & Plan Note (Addendum)
 This is a 1 month blood pressure follow-up visit.  He is without symptoms or complaints.  Last month, lisinopril  was stopped in favor of valsartan -hydrochlorothiazide  160-25 daily with elevated office and home measurements.  Today blood pressure is high at 155/72.  However, he checks his blood pressure at home with a machine that has been appropriately calibrated and measurements are usually 110-130 systolic and 60-70 diastolic.  If anything, his blood pressure runs a little low, however he denies significant orthostatic symptoms.  He does note that he discovered after last month's visit that he had missed his lisinopril  for a couple of weeks.  In any case, he is agreeable to continue with his current regimen.  -Continue valsartan -hydrochlorothiazide  160-25 tablet daily. - Will collect BMP today to assess for renal and electrolyte abnormalities - RTC in 5 months for general check

## 2024-04-21 NOTE — Patient Instructions (Signed)
 Mr Gift,  I will check your bloodwork today which is common after a change in medicine.   Please continue your blood pressure pill. If you experience frequent headaches, vision changes, or dizziness on standing, please let us  know.  Otherwise, return in 5 months for a general checkup.

## 2024-04-22 LAB — BASIC METABOLIC PANEL WITH GFR
BUN/Creatinine Ratio: 18 (ref 10–24)
BUN: 25 mg/dL (ref 8–27)
CO2: 23 mmol/L (ref 20–29)
Calcium: 9.9 mg/dL (ref 8.6–10.2)
Chloride: 101 mmol/L (ref 96–106)
Creatinine, Ser: 1.4 mg/dL — ABNORMAL HIGH (ref 0.76–1.27)
Glucose: 187 mg/dL — ABNORMAL HIGH (ref 70–99)
Potassium: 4.1 mmol/L (ref 3.5–5.2)
Sodium: 138 mmol/L (ref 134–144)
eGFR: 52 mL/min/1.73 — ABNORMAL LOW (ref >59)

## 2024-04-23 ENCOUNTER — Ambulatory Visit: Payer: Medicare (Managed Care)

## 2024-04-23 ENCOUNTER — Other Ambulatory Visit: Payer: Self-pay | Admitting: Student

## 2024-04-23 VITALS — Ht 73.0 in | Wt 190.0 lb

## 2024-04-23 DIAGNOSIS — Z0000 Encounter for general adult medical examination without abnormal findings: Secondary | ICD-10-CM | POA: Diagnosis not present

## 2024-04-23 DIAGNOSIS — E1151 Type 2 diabetes mellitus with diabetic peripheral angiopathy without gangrene: Secondary | ICD-10-CM

## 2024-04-23 NOTE — Progress Notes (Signed)
 Internal Medicine Clinic Attending  Case discussed with the resident at the time of the visit.  We reviewed the resident's history and exam and pertinent patient test results.  I agree with the assessment, diagnosis, and plan of care documented in the resident's note.

## 2024-04-23 NOTE — Patient Instructions (Signed)
 [name],  Thank you for taking the time for your Medicare Wellness Visit. I appreciate your continued commitment to your health goals. Please review the care plan we discussed, and feel free to reach out if I can assist you further.  Please note that Annual Wellness Visits do not include a physical exam. Some assessments may be limited, especially if the visit was conducted virtually. If needed, we may recommend an in-person follow-up with your provider.  Ongoing Care Seeing your primary care provider every 3 to 6 months helps us  monitor your health and provide consistent, personalized care.   Referrals If a referral was made during today's visit and you haven't received any updates within two weeks, please contact the referred provider directly to check on the status.  Recommended Screenings:  Health Maintenance  Topic Date Due   Zoster (Shingles) Vaccine (1 of 2) Never done   Medicare Annual Wellness Visit  04/05/2023   Complete foot exam   09/26/2023   COVID-19 Vaccine (6 - 2025-26 season) 01/07/2024   Hemoglobin A1C  06/21/2024   Yearly kidney health urinalysis for diabetes  07/31/2024   Eye exam for diabetics  07/31/2024   Lipid (cholesterol) test  12/03/2024   Yearly kidney function blood test for diabetes  04/21/2025   Colon Cancer Screening  08/09/2025   DTaP/Tdap/Td vaccine (2 - Td or Tdap) 08/30/2031   Pneumococcal Vaccine for age over 73  Completed   Flu Shot  Completed   Hepatitis C Screening  Completed   Meningitis B Vaccine  Aged Out   Hepatitis B Vaccine  Discontinued       04/23/2024   10:35 AM  Advanced Directives  Does Patient Have a Medical Advance Directive? No  Would patient like information on creating a medical advance directive? No - Patient declined    Vision: Annual vision screenings are recommended for early detection of glaucoma, cataracts, and diabetic retinopathy. These exams can also reveal signs of chronic conditions such as diabetes and  high blood pressure.  Dental: Annual dental screenings help detect early signs of oral cancer, gum disease, and other conditions linked to overall health, including heart disease and diabetes.  Please see the attached documents for additional preventive care recommendations.

## 2024-04-23 NOTE — Progress Notes (Signed)
 Chief Complaint  Patient presents with   Medicare Wellness    SUBSEQUENT     Subjective:   Nathan [name] is a 73 y.o. male who presents for a Medicare Annual Wellness Visit.  Visit info / Clinical Intake: Medicare Wellness Visit Type:: Subsequent Annual Wellness Visit Persons participating in visit and providing information:: patient Medicare Wellness Visit Mode:: Telephone If telephone:: video declined Since this visit was completed virtually, some vitals may be partially provided or unavailable. Missing vitals are due to the limitations of the virtual format.: Documented vitals are patient reported If Telephone or Video please confirm:: I connected with patient using audio/video enable telemedicine. I verified patient identity with two identifiers, discussed telehealth limitations, and patient agreed to proceed. Patient Location:: Home Provider Location:: Office Interpreter Needed?: No Pre-visit prep was completed: yes AWV questionnaire completed by patient prior to visit?: no Living arrangements:: (!) lives alone Patient's Overall Health Status Rating: (!) fair Typical amount of pain: some Does pain affect daily life?: no Are you currently prescribed opioids?: no  Dietary Habits and Nutritional Risks How many meals a day?: 3 (GOOD APPETITE) Eats fruit and vegetables daily?: (!) no (BANANA EVERYDAY) Most meals are obtained by: preparing own meals In the last 2 weeks, have you had any of the following?: none Diabetic:: (!) yes Any non-healing wounds?: no How often do you check your BS?: continuous glucose monitor Would you like to be referred to a Nutritionist or for Diabetic Management? : no  Functional Status Activities of Daily Living (to include ambulation/medication): Independent Ambulation: Independent with device- listed below Home Assistive Devices/Equipment: Prosthesis; Elevated toliet seat; Shower/tub chair Medication Administration: Independent Home  Management (perform basic housework or laundry): Independent Manage your own finances?: yes Primary transportation is: driving Concerns about vision?: no *vision screening is required for WTM* Concerns about hearing?: no  Fall Screening Falls in the past year?: 1 Number of falls in past year: 1 Was there an injury with Fall?: 0 Fall Risk Category Calculator: 2 Patient Fall Risk Level: Moderate Fall Risk  Fall Risk Patient at Risk for Falls Due to: Impaired balance/gait Fall risk Follow up: Falls evaluation completed; Education provided  Home and Transportation Safety: All rugs have non-skid backing?: N/A, no rugs All stairs or steps have railings?: N/A, no stairs Grab bars in the bathtub or shower?: yes Have non-skid surface in bathtub or shower?: yes Good home lighting?: yes Regular seat belt use?: yes Hospital stays in the last year:: (!) yes How many hospital stays:: 2 Reason: Cellulitis  Cognitive Assessment Difficulty concentrating, remembering, or making decisions? : no Will 6CIT or Mini Cog be Completed: yes What year is it?: 0 points What month is it?: 0 points Give patient an address phrase to remember (5 components): Ginnie Botts [address] About what time is it?: 0 points Count backwards from 20 to 1: 0 points Say the months of the year in reverse: 0 points Repeat the address phrase from earlier: 0 points 6 CIT Score: 0 points  Advance Directives (For Healthcare) Does Patient Have a Medical Advance Directive?: No Would patient like information on creating a medical advance directive?: No - Patient declined  Reviewed/Updated  Reviewed/Updated: Reviewed All (Medical, Surgical, Family, Medications, Allergies, Care Teams, Patient Goals)    Allergies (verified) Sulfonamide derivatives   Current Medications (verified) Outpatient Encounter Medications as of 04/23/2024  Medication Sig   acetaminophen  (TYLENOL ) 500 MG tablet Take 2 tablets (1,000 mg total)  by mouth every 6 (six)  hours.   atorvastatin  (LIPITOR) 40 MG tablet TAKE 1 TABLET(40 MG) BY MOUTH DAILY   bictegravir-emtricitabine -tenofovir  AF (BIKTARVY ) 50-200-25 MG TABS tablet Take 1 tablet by mouth daily.   Blood Glucose Monitoring Suppl (FREESTYLE LITE) w/Device KIT 1 Device by Does not apply route daily in the afternoon.   chlorhexidine  (HIBICLENS ) 4 % external liquid Apply topically daily. Shower  with chlorhexidine  every day below neck   cilostazol  (PLETAL ) 50 MG tablet TAKE 1 TABLET(50 MG) BY MOUTH TWICE DAILY   clindamycin  (CLEOCIN ) 300 MG capsule Take 1 capsule (300 mg total) by mouth 3 (three) times daily.   clopidogrel  (PLAVIX ) 75 MG tablet TAKE 1 TABLET(75 MG) BY MOUTH DAILY   clotrimazole  (LOTRIMIN ) 1 % cream Apply 1 Application topically 2 (two) times daily.   Continuous Glucose Sensor (FREESTYLE LIBRE 3 SENSOR) MISC APPLY 1 SENSOR TO SKIN EVERY 14 DAYS. USE TO CHECK GLUCOSE CONTINUOUSLY   empagliflozin  (JARDIANCE ) 25 MG TABS tablet Take 1 tablet (25 mg total) by mouth daily.   ezetimibe  (ZETIA ) 10 MG tablet TAKE 1 TABLET(10 MG) BY MOUTH DAILY   glucose blood (FREESTYLE LITE) test strip Use 5 times daily to check blood sugar.   glucose blood (FREESTYLE LITE) test strip Use as instructed   HUMALOG  100 UNIT/ML injection Use 35 units with breakfast and 30 units with your other main meals of the day   injection device for insulin  (CEQUR SIMPLICITY 2U) DEVI change every 2 days   insulin  glargine, 2 Unit Dial, (TOUJEO  MAX SOLOSTAR) 300 UNIT/ML Solostar Pen Inject 50 Units into the skin in the morning.   Insulin  Pen Needle 32G X 4 MM MISC Use to inject toujeo  one time daily   Insulin  Syringe-Needle U-100 (GNP INSULIN  SYRINGES) 30G X 5/16 1 ML MISC 1 Device by Does not apply route daily in the afternoon.   Lancet Devices (BAYER MICROLET 2 LANCING DEVIC) MISC Use to check blood sugar up to 3 ties a day   Lancets (FREESTYLE) lancets Use 5 times daily to check blood sugar.   linezolid   (ZYVOX ) 600 MG tablet Take 1 tablet (600 mg total) by mouth every 12 (twelve) hours for 6 days.   lipase/protease/amylase (CREON ) 36000 UNITS CPEP capsule TAKE 1 CAPSULE BY MOUTH THREE TIMES DAILY BEFORE A MEAL   mupirocin  ointment (BACTROBAN ) 2 % Apply 1 Application topically 2 (two) times daily.   omeprazole  (PRILOSEC) 40 MG capsule TAKE 1 CAPSULE(40 MG) BY MOUTH DAILY   pioglitazone  (ACTOS ) 30 MG tablet TAKE 1 TABLET(30 MG) BY MOUTH DAILY   tirzepatide  (MOUNJARO ) 15 MG/0.5ML Pen Inject 15 mg into the skin once a week.   valsartan -hydrochlorothiazide  (DIOVAN  HCT) 160-25 MG tablet Take 1 tablet by mouth daily.   No facility-administered encounter medications on file as of 04/23/2024.    History: Past Medical History:  Diagnosis Date   Asthma    per 2003 [facility] pulm records pfts    Blood dyscrasia    HIV   Carotid artery occlusion    Clotting disorder    Colon polyps    noted previous colonoscopy [facility]   DDD (degenerative disc disease)    cervical spine   Depression    Diabetes mellitus    dx 2010   Diarrhea 02/24/2021   Dizziness 08/25/2021   Falls 08/25/2021   Fever    unknwon origin   GERD (gastroesophageal reflux disease)    Head swelling 05/23/2017   Hep C w/o coma, chronic (HCC)    History of syphilis  noted [facility] records   HIV infection (HCC)    undetectable viral load and CD4 ct 667 as of 11/2011   Hyperlipidemia    Hypertension    MRSA (methicillin resistant Staphylococcus aureus)    Multiple sclerosis    in remission as of 11/2011 (diagnosed late 1980s)   Multiple sclerosis 02/12/2006   Unclear where this diagnosis came from.  Our earliest note is from Dr. Neomi Essex in 2008 and lists remote hx of multiple sclerosis.       Pain in limb-Right Leg 10/13/2013   Pancreas (digestive gland) works poorly    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HCC)    2002   Pneumonia    Prosthesis fitting 03/10/2019   PVD (peripheral vascular disease)    Left Stent  07/02/2008   Scalp lesion 07/26/2017   UTI (urinary tract infection)    Wears dentures    Past Surgical History:  Procedure Laterality Date   ABDOMINAL AORTOGRAM W/LOWER EXTREMITY N/A 08/07/2017   Procedure: ABDOMINAL AORTOGRAM W/LOWER EXTREMITY;  Surgeon: Serene Gaile ORN, MD;  Location: [facility] INVASIVE CV LAB;  Service: Cardiovascular;  Laterality: N/A;   ABOVE KNEE LEG AMPUTATION  2009   Right Leg   COLONOSCOPY W/ BIOPSIES AND POLYPECTOMY     ENDARTERECTOMY Left 02/22/2023   Procedure: LEFT ENDARTERECTOMY CAROTID;  Surgeon: Serene Gaile ORN, MD;  Location: [facility] OR;  Service: Vascular;  Laterality: Left;   LOWER EXTREMITY ANGIOGRAM Left 12/26/2011   Procedure: LOWER EXTREMITY ANGIOGRAM;  Surgeon: Gaile ORN Serene, MD;  Location: [facility] CATH LAB;  Service: Cardiovascular;  Laterality: Left;   LOWER EXTREMITY ANGIOGRAM Left 08/17/2017   Procedure: LOWER EXTREMITY ANGIOGRAM LEFT LEG WITH RUNOFF AND Stenting.;  Surgeon: Serene Gaile ORN, MD;  Location: [facility] OR;  Service: Vascular;  Laterality: Left;   MULTIPLE TOOTH EXTRACTIONS     OTHER SURGICAL HISTORY     left left with stents (Dr. Celso)   OTHER SURGICAL HISTORY     2003 colonoscopy 5 mm polyp transverse colon; (2)47mm  polyps in rectum-hyperplastic   PATCH ANGIOPLASTY Left 02/22/2023   Procedure: PATCH ANGIOPLASTY 1CMx14CM GEORGE LISLE;  Surgeon: Serene Gaile ORN, MD;  Location: [facility] OR;  Service: Vascular;  Laterality: Left;   PERIPHERAL VASCULAR INTERVENTION Left 08/17/2017   Procedure: POPLITEAL STENT;  Surgeon: Serene Gaile ORN, MD;  Location: [facility] OR;  Service: Vascular;  Laterality: Left;   Family History  Problem Relation Age of Onset   Diabetes Mother    Coronary artery disease Mother    Coronary artery disease Father    Liver disease Sister    Cancer Brother        colon caner stage 4 as of 11/2011 (unknown age of onset)   Prostate cancer Brother    Colon cancer Brother    Rectal cancer Neg Hx    Esophageal cancer Neg Hx     Social History   Occupational History   Occupation: retired  Tobacco Use   Smoking status: Former    Current packs/day: 0.00    Types: Cigarettes    Quit date: 1967    Years since quitting: 59.0    Passive exposure: Never   Smokeless tobacco: Former    Types: Chew    Quit date: 1980  Vaping Use   Vaping status: Never Used  Substance and Sexual Activity   Alcohol use: No    Alcohol/week: 0.0 standard drinks of alcohol   Drug use: No   Sexual activity: Yes    Partners:  Male    Comment: declined condoms   Tobacco Counseling Counseling given: Not Answered  SDOH Screenings   Food Insecurity: No Food Insecurity (04/23/2024)  Housing: Low Risk (04/23/2024)  Transportation Needs: No Transportation Needs (04/23/2024)  Utilities: Not At Risk (04/23/2024)  Alcohol Screen: Low Risk (04/23/2024)  Depression (PHQ2-9): Low Risk (04/23/2024)  Financial Resource Strain: Low Risk (04/23/2024)  Physical Activity: Insufficiently Active (04/23/2024)  Social Connections: Socially Isolated (04/23/2024)  Stress: No Stress Concern Present (04/23/2024)  Tobacco Use: Medium Risk (04/23/2024)  Health Literacy: Adequate Health Literacy (04/23/2024)   See flowsheets for full screening details  Depression Screen Depression Screening Exception Documentation Depression Screening Exception:: Patient refusal (denies SI)  PHQ 2 & 9 Depression Scale- Over the past 2 weeks, how often have you been bothered by any of the following problems? Little interest or pleasure in doing things: 0 Feeling down, depressed, or hopeless (PHQ Adolescent also includes...irritable): 0 PHQ-2 Total Score: 0 Trouble falling or staying asleep, or sleeping too much: 0 Feeling tired or having little energy: 0 Poor appetite or overeating (PHQ Adolescent also includes...weight loss): 0 Feeling bad about yourself - or that you are a failure or have let yourself or your family down: 0 Trouble concentrating on things, such  as reading the newspaper or watching television (PHQ Adolescent also includes...like school work): 0 Moving or speaking so slowly that other people could have noticed. Or the opposite - being so fidgety or restless that you have been moving around a lot more than usual: 0 Thoughts that you would be better off dead, or of hurting yourself in some way: 0 PHQ-9 Total Score: 0 If you checked off any problems, how difficult have these problems made it for you to do your work, take care of things at home, or get along with other people?: Not difficult at all  Depression Treatment Depression Interventions/Treatment : EYV7-0 Score <4 Follow-up Not Indicated     Goals Addressed             This Visit's Progress    04/23/2024: To maintain my health.               Objective:    Today's Vitals   04/23/24 1033  Weight: 190 lb (86.2 kg)  Height: 6' 1 (1.854 m)  PainSc: 3   PainLoc: Leg   Body mass index is 25.07 kg/m.  Hearing/Vision screen Hearing Screening - Comments:: Adequate hearing, no hearing aids. Vision Screening - Comments:: RetinaVue completed in office when due. Immunizations and Health Maintenance Health Maintenance  Topic Date Due   FOOT EXAM  09/26/2023   HEMOGLOBIN A1C  06/21/2024   COVID-19 Vaccine (7 - Pfizer risk 2025-26 season) 07/30/2024   Diabetic kidney evaluation - Urine ACR  07/31/2024   OPHTHALMOLOGY EXAM  07/31/2024   LIPID PANEL  12/03/2024   Diabetic kidney evaluation - eGFR measurement  04/21/2025   Medicare Annual Wellness (AWV)  04/23/2025   Colonoscopy  08/09/2025   DTaP/Tdap/Td (2 - Td or Tdap) 08/30/2031   Pneumococcal Vaccine: 50+ Years  Completed   Influenza Vaccine  Completed   Hepatitis C Screening  Completed   Zoster Vaccines- Shingrix  Completed   Meningococcal B Vaccine  Aged Out   Hepatitis B Vaccines 19-59 Average Risk  Discontinued        Assessment/Plan:  This is a routine wellness examination for [place].  Patient Care  Team: Nooruddin, Saad, MD as PCP - General Fleeta Rothman, Jomarie SAILOR, MD as PCP -  Infectious Diseases (Infectious Diseases) Plyler, Arland HERO, RD as Dietitian (Dietician) Teressa Toribio SQUIBB, MD (Inactive) as Attending Physician (Gastroenterology)  I have personally reviewed and noted the following in the patients chart:   Medical and social history Use of alcohol, tobacco or illicit drugs  Current medications and supplements including opioid prescriptions. Functional ability and status Nutritional status Physical activity Advanced directives List of other physicians Hospitalizations, surgeries, and ER visits in previous 12 months Vitals Screenings to include cognitive, depression, and falls Referrals and appointments  No orders of the defined types were placed in this encounter.  In addition, I have reviewed and discussed with patient certain preventive protocols, quality metrics, and best practice recommendations. A written personalized care plan for preventive services as well as general preventive health recommendations were provided to patient.   Roz LOISE Fuller, LPN   [id number]   Return in about 1 year (around 04/23/2025) for Medicare wellness.  After Visit Summary: (Declined) Due to this being a telephonic visit, with patients personalized plan was offered to patient but patient Declined AVS at this time   Nurse Notes:  HM Addressed: Diabetic Foot Exam recommended [facility] was verified and record updated.  Patient is current with care gaps except for diabetic foot exam.

## 2024-04-23 NOTE — Addendum Note (Signed)
 Addended by: Donnia Poplaski L on: 04/23/2024 11:26 AM   Modules accepted: Level of Service

## 2024-04-24 ENCOUNTER — Other Ambulatory Visit: Payer: Self-pay | Admitting: Student

## 2024-04-24 ENCOUNTER — Ambulatory Visit: Payer: Self-pay | Admitting: Student

## 2024-04-24 DIAGNOSIS — I739 Peripheral vascular disease, unspecified: Secondary | ICD-10-CM

## 2024-04-24 DIAGNOSIS — I10 Essential (primary) hypertension: Secondary | ICD-10-CM

## 2024-04-24 MED ORDER — LISINOPRIL 40 MG PO TABS: 40.0000 mg | ORAL_TABLET | Freq: Every day | ORAL | 3 refills | Status: AC

## 2024-04-24 NOTE — Addendum Note (Signed)
 Addended by: HARRIE BRUCKNER on: 04/24/2024 03:32 PM   Modules accepted: Orders

## 2024-05-06 NOTE — Progress Notes (Signed)
 Internal Medicine Attending:  I reviewed the AWV findings of the medical professional who conducted the visit. I was present in the office suite and immediately available to provide assistance and direction throughout the time the service was provided.

## 2024-05-11 ENCOUNTER — Other Ambulatory Visit: Payer: Self-pay | Admitting: Internal Medicine

## 2024-05-11 DIAGNOSIS — E1169 Type 2 diabetes mellitus with other specified complication: Secondary | ICD-10-CM

## 2024-05-15 ENCOUNTER — Other Ambulatory Visit: Payer: Medicare (Managed Care)

## 2024-05-15 ENCOUNTER — Ambulatory Visit: Payer: Medicare (Managed Care) | Admitting: *Deleted

## 2024-05-15 DIAGNOSIS — I10 Essential (primary) hypertension: Secondary | ICD-10-CM

## 2024-05-15 NOTE — Progress Notes (Signed)
" ° ° °  Nathan [name] presented today for blood pressure check. Patient is prescribed blood pressure medications and I confirmed that patient did not take their blood pressure medication prior to todays appointment; pt stated he takes his BP medication at night. Blood pressure was taken in the usual and appropriate manner using an automated BP cuff.     Vitals:   05/15/24 1414 05/15/24 1420  BP: (!) 118/46 (!) 125/52   Pt stated his DBP is usually not this low (@ 46); pt want to know what to do - I asked Dr Harrie to speak to pt in which he did.   Results of todays visit will be routed to Dr. Harrie for review and further management.     "

## 2024-05-16 LAB — BASIC METABOLIC PANEL WITH GFR
BUN/Creatinine Ratio: 14 (ref 10–24)
BUN: 16 mg/dL (ref 8–27)
CO2: 21 mmol/L (ref 20–29)
Calcium: 9.7 mg/dL (ref 8.6–10.2)
Chloride: 105 mmol/L (ref 96–106)
Creatinine, Ser: 1.11 mg/dL (ref 0.76–1.27)
Glucose: 225 mg/dL — ABNORMAL HIGH (ref 70–99)
Potassium: 4 mmol/L (ref 3.5–5.2)
Sodium: 142 mmol/L (ref 134–144)
eGFR: 69 mL/min/1.73

## 2024-06-02 ENCOUNTER — Ambulatory Visit: Payer: Medicare (Managed Care)

## 2024-06-02 ENCOUNTER — Ambulatory Visit (HOSPITAL_COMMUNITY): Payer: Medicare (Managed Care)

## 2024-06-09 ENCOUNTER — Other Ambulatory Visit: Payer: Medicare (Managed Care)

## 2024-06-11 ENCOUNTER — Telehealth: Payer: Self-pay | Admitting: Dietician

## 2024-06-11 DIAGNOSIS — E1151 Type 2 diabetes mellitus with diabetic peripheral angiopathy without gangrene: Secondary | ICD-10-CM

## 2024-06-11 DIAGNOSIS — E1169 Type 2 diabetes mellitus with other specified complication: Secondary | ICD-10-CM

## 2024-06-12 MED ORDER — MOUNJARO 15 MG/0.5ML ~~LOC~~ SOAJ: 15.0000 mg | SUBCUTANEOUS | 3 refills | Status: DC

## 2024-06-12 MED ORDER — CEQUR SIMPLICITY 2U DEVI: 3 refills | Status: AC

## 2024-06-12 NOTE — Telephone Encounter (Signed)
 Meds ordered this encounter  Medications   tirzepatide  (MOUNJARO ) 15 MG/0.5ML Pen    Sig: Inject 15 mg into the skin once a week.    Dispense:  6 mL    Refill:  3   injection device for insulin  (CEQUR SIMPLICITY 2U) DEVI    Sig: change every 2 days    Dispense:  45 each    Refill:  3    The patient is insulin  requiring, ICD 10 code E11.65.   Ozell Kung MD 06/12/2024, 5:19 PM

## 2024-06-12 NOTE — Telephone Encounter (Signed)
 Called to schedule Diabetes Self Management Education & Support follow up appointment  He states that he needs refills on once a week and long acting insulin , cecur simplicity patches.  Would like 90 day prescriptions. thinks his insurance is no longer covering Toujeo  or Cecur simplicity patches.  Gave him Dr. Kris number, he plans to schedule with her right away.

## 2024-06-13 ENCOUNTER — Ambulatory Visit: Payer: Medicare (Managed Care) | Admitting: Internal Medicine

## 2024-06-13 ENCOUNTER — Encounter: Payer: Self-pay | Admitting: Internal Medicine

## 2024-06-13 VITALS — BP 152/70 | Ht 73.0 in | Wt 192.0 lb

## 2024-06-13 DIAGNOSIS — E1169 Type 2 diabetes mellitus with other specified complication: Secondary | ICD-10-CM

## 2024-06-13 DIAGNOSIS — E1151 Type 2 diabetes mellitus with diabetic peripheral angiopathy without gangrene: Secondary | ICD-10-CM

## 2024-06-13 LAB — POCT GLYCOSYLATED HEMOGLOBIN (HGB A1C): Hemoglobin A1C: 9.3 % — AB (ref 4.0–5.6)

## 2024-06-13 MED ORDER — MOUNJARO 15 MG/0.5ML ~~LOC~~ SOAJ: 15.0000 mg | SUBCUTANEOUS | 3 refills | Status: AC

## 2024-06-13 MED ORDER — INSULIN LISPRO 100 UNIT/ML IJ SOLN: INTRAMUSCULAR | 3 refills | Status: AC

## 2024-06-13 MED ORDER — EMPAGLIFLOZIN 25 MG PO TABS: 25.0000 mg | ORAL_TABLET | Freq: Every day | ORAL | 3 refills | Status: AC

## 2024-06-13 MED ORDER — TOUJEO MAX SOLOSTAR 300 UNIT/ML ~~LOC~~ SOPN: 56.0000 [IU] | PEN_INJECTOR | Freq: Every day | SUBCUTANEOUS | 4 refills | Status: AC

## 2024-06-13 MED ORDER — INSULIN PEN NEEDLE 32G X 4 MM MISC: 3 refills | Status: AC

## 2024-06-13 NOTE — Progress Notes (Signed)
 " Name: Nathan [name]  MRN/ DOB: [id number], 1950-07-15   Age/ Sex: 74 y.o., male    PCP: Nooruddin, Saad, MD   Reason for Endocrinology Evaluation: Type 2 Diabetes Mellitus     Date of Initial Endocrinology Visit: 09/09/2021    PATIENT IDENTIFIER: Nathan [name] is a 74 y.o. male with a past medical history of T2DM, HIV, HTN, GERD and Dyslipidemia. The patient presented for initial endocrinology clinic visit on 06/13/2024 for consultative assistance with his diabetes management.    HPI: Mr. Sinning was    Diagnosed with DM 2010 Prior Medications tried/Intolerance: Jardiance  started 08/2021. Has metformin  intolerance . Started Ozempic  2022          Hemoglobin A1c has ranged from 5.6% in 2019, peaking at 10.5% in 2022.   No prior DKA    On his initial visit to our clinic he had an A1c of 7.2%, we increased Jardiance , continued Ozempic , adjusted MDI regimen  He was restarted on pioglitazone  through his PCPs office 07/2022  I switch Ozempic  to Mounjaro  05/2023, due to persistent hyperglycemia with an A1c of 8.3%   Discontinued pioglitazone  due to lower extremity edema by February, 2026  SUBJECTIVE:   During the last visit (08/29/2023): A1c 8.3%    Today (06/13/24): Nathan [name] is here for a follow up on diabetes management . He checks his blood sugars 1-3 times daily. The patient has had hypoglycemic episodes since the last clinic visit. He is not symptomatic   The Pt has NOT been to our clinic in 10 months  Continues to follow-up with infectious disease for HIV management, the patient was also evaluated by infectious disease in October, 2025 for recurrent genital boils, he was treated with antibiotics, this has resolved   He has been following up with a dietitian in 2025 The patient did sustain a fall complicated by right arm fracture in November, 2025 He continues with chronic diarrhea due to pancreatic insufficiency, which is better  No nausea  He eats 3 meals a day  with snacking   HOME DIABETES REGIMEN: Pioglitazone  30 mg daily- has been out for 2 months  Jardiance  25 mg daily  Toujeo  ( Generic )  56  units daily  HUmalog  30 units with meal  - 15 clicks with each meal  Snacks 5 clicks  Mounjaro  15 mg weekly CeQur    Statin: yes ACE-I/ARB: yes   CONTINUOUS GLUCOSE MONITORING RECORD INTERPRETATION                       Glycemic patterns summary: BGs are optimal overnight and increased throughout the day  Hyperglycemic episodes postprandial  Hypoglycemic episodes occurred N/A  Overnight periods: Optimal    DIABETIC COMPLICATIONS: Microvascular complications:  S/P right Above knee amputations, neuropathy Denies: CKD , retinopathy Last eye exam: Completed 04/04/2022  Macrovascular complications:  PVD Denies: CAD, CVA   PAST HISTORY: Past Medical History:  Past Medical History:  Diagnosis Date   Asthma    per 2003 [facility] pulm records [facility]    Blood dyscrasia    HIV   Carotid artery occlusion    Clotting disorder    Colon polyps    noted previous colonoscopy [facility]   DDD (degenerative disc disease)    cervical spine   Depression    Diabetes mellitus    dx 2010   Diarrhea 02/24/2021   Dizziness 08/25/2021   Falls 08/25/2021   Fever    unknwon origin  GERD (gastroesophageal reflux disease)    Head swelling 05/23/2017   Hep C w/o coma, chronic (HCC)    History of syphilis    noted [facility] records   HIV infection (HCC)    undetectable viral load and CD4 ct 667 as of 11/2011   Hyperlipidemia    Hypertension    MRSA (methicillin resistant Staphylococcus aureus)    Multiple sclerosis    in remission as of 11/2011 (diagnosed late 1980s)   Multiple sclerosis 02/12/2006   Unclear where this diagnosis came from.  Our earliest note is from Dr. Neomi Essex in 2008 and lists remote hx of multiple sclerosis.       Pain in limb-Right Leg 10/13/2013   Pancreas (digestive gland) works poorly    PCP  (pneumocystis jiroveci pneumonia) (HCC)    2002   Pneumonia    Prosthesis fitting 03/10/2019   PVD (peripheral vascular disease)    Left Stent 07/02/2008   Scalp lesion 07/26/2017   UTI (urinary tract infection)    Wears dentures    Past Surgical History:  Past Surgical History:  Procedure Laterality Date   ABDOMINAL AORTOGRAM W/LOWER EXTREMITY N/A 08/07/2017   Procedure: ABDOMINAL AORTOGRAM W/LOWER EXTREMITY;  Surgeon: Serene Gaile ORN, MD;  Location: [facility];  Service: Cardiovascular;  Laterality: N/A;   ABOVE KNEE LEG AMPUTATION  2009   Right Leg   COLONOSCOPY W/ BIOPSIES AND POLYPECTOMY     ENDARTERECTOMY Left 02/22/2023   Procedure: LEFT ENDARTERECTOMY CAROTID;  Surgeon: Serene Gaile ORN, MD;  Location: [facility] OR;  Service: Vascular;  Laterality: Left;   LOWER EXTREMITY ANGIOGRAM Left 12/26/2011   Procedure: LOWER EXTREMITY ANGIOGRAM;  Surgeon: Gaile ORN Serene, MD;  Location: [facility] CATH LAB;  Service: Cardiovascular;  Laterality: Left;   LOWER EXTREMITY ANGIOGRAM Left 08/17/2017   Procedure: LOWER EXTREMITY ANGIOGRAM LEFT LEG WITH RUNOFF AND Stenting.;  Surgeon: Serene Gaile ORN, MD;  Location: [facility] OR;  Service: Vascular;  Laterality: Left;   MULTIPLE TOOTH EXTRACTIONS     OTHER SURGICAL HISTORY     left left with stents (Dr. Celso)   OTHER SURGICAL HISTORY     2003 colonoscopy 5 mm polyp transverse colon; (2)37mm  polyps in rectum-hyperplastic   PATCH ANGIOPLASTY Left 02/22/2023   Procedure: PATCH ANGIOPLASTY 1CMx14CM GEORGE LISLE;  Surgeon: Serene Gaile ORN, MD;  Location: [facility] OR;  Service: Vascular;  Laterality: Left;   PERIPHERAL VASCULAR INTERVENTION Left 08/17/2017   Procedure: POPLITEAL STENT;  Surgeon: Serene Gaile ORN, MD;  Location: [facility] OR;  Service: Vascular;  Laterality: Left;    Social History:  reports that he quit smoking about 59 years ago. His smoking use included cigarettes. He has never been exposed to tobacco smoke. He quit smokeless tobacco use  about 46 years ago.  His smokeless tobacco use included chew. He reports that he does not drink alcohol and does not use drugs. Family History:  Family History  Problem Relation Age of Onset   Diabetes Mother    Coronary artery disease Mother    Coronary artery disease Father    Liver disease Sister    Cancer Brother        colon caner stage 4 as of 11/2011 (unknown age of onset)   Prostate cancer Brother    Colon cancer Brother    Rectal cancer Neg Hx    Esophageal cancer Neg Hx      HOME MEDICATIONS: Allergies as of 06/13/2024       Reactions  Sulfonamide Derivatives Hives        Medication List        Accurate as of June 13, 2024  8:06 AM. If you have any questions, ask your nurse or doctor.          acetaminophen  500 MG tablet Commonly known as: TYLENOL  Take 2 tablets (1,000 mg total) by mouth every 6 (six) hours.   atorvastatin  40 MG tablet Commonly known as: LIPITOR TAKE 1 TABLET(40 MG) BY MOUTH DAILY   Bayer Microlet 2 Lancing Devic Misc Use to check blood sugar up to 3 ties a day   Biktarvy  50-200-25 MG Tabs tablet Generic drug: bictegravir-emtricitabine -tenofovir  AF Take 1 tablet by mouth daily.   CeQur Simplicity 2U Devi Generic drug: injection device for insulin  change every 2 days   chlorhexidine  4 % external liquid Commonly known as: HIBICLENS  Apply topically daily. Shower  with chlorhexidine  every day below neck   cilostazol  50 MG tablet Commonly known as: PLETAL  TAKE 1 TABLET(50 MG) BY MOUTH TWICE DAILY   clindamycin  300 MG capsule Commonly known as: CLEOCIN  Take 1 capsule (300 mg total) by mouth 3 (three) times daily.   clopidogrel  75 MG tablet Commonly known as: PLAVIX  TAKE 1 TABLET(75 MG) BY MOUTH DAILY   clotrimazole  1 % cream Commonly known as: LOTRIMIN  Apply 1 Application topically 2 (two) times daily.   Creon  36000-114000 units Cpep capsule Generic drug: lipase/protease/amylase TAKE 1 CAPSULE BY MOUTH THREE TIMES  DAILY BEFORE A MEAL   empagliflozin  25 MG Tabs tablet Commonly known as: Jardiance  Take 1 tablet (25 mg total) by mouth daily.   ezetimibe  10 MG tablet Commonly known as: ZETIA  TAKE 1 TABLET(10 MG) BY MOUTH DAILY   freestyle lancets Use 5 times daily to check blood sugar.   FreeStyle Libre 3 Sensor Misc APPLY 1 SENSOR TO SKIN EVERY 14 DAYS. USE TO CHECK GLUCOSE CONTINUOUSLY   FREESTYLE LITE test strip Generic drug: glucose blood Use 5 times daily to check blood sugar.   FREESTYLE LITE test strip Generic drug: glucose blood Use as instructed   FreeStyle Lite w/Device Kit 1 Device by Does not apply route daily in the afternoon.   HumaLOG  100 UNIT/ML injection Generic drug: insulin  lispro Use 35 units with breakfast and 30 units with your other main meals of the day   Insulin  Pen Needle 32G X 4 MM Misc Use to inject toujeo  one time daily   Insulin  Syringe-Needle U-100 30G X 5/16 1 ML Misc Commonly known as: GNP Insulin  Syringes 1 Device by Does not apply route daily in the afternoon.   linezolid  600 MG tablet Commonly known as: ZYVOX  Take 1 tablet (600 mg total) by mouth every 12 (twelve) hours for 6 days.   lisinopril  40 MG tablet Commonly known as: ZESTRIL  Take 1 tablet (40 mg total) by mouth daily.   Mounjaro  15 MG/0.5ML Pen Generic drug: tirzepatide  Inject 15 mg into the skin once a week.   mupirocin  ointment 2 % Commonly known as: BACTROBAN  Apply 1 Application topically 2 (two) times daily.   omeprazole  40 MG capsule Commonly known as: PRILOSEC TAKE 1 CAPSULE(40 MG) BY MOUTH DAILY   pioglitazone  30 MG tablet Commonly known as: ACTOS  TAKE 1 TABLET(30 MG) BY MOUTH DAILY   Toujeo  Max SoloStar 300 UNIT/ML Solostar Pen Generic drug: insulin  glargine (2 Unit Dial) ADMINISTER 56 UNITS UNDER THE SKIN IN THE MORNING         ALLERGIES: Allergies  Allergen Reactions   Sulfonamide Derivatives Hives  REVIEW OF SYSTEMS: A comprehensive ROS was  conducted with the patient and is negative except as per HPI    OBJECTIVE:   VITAL SIGNS: BP (!) 152/70   Ht 6' 1 (1.854 m)   Wt 192 lb (87.1 kg)   BMI 25.33 kg/m    PHYSICAL EXAM:  General: Pt appears well and is in NAD  Neuro: MS is good with appropriate affect, pt is alert and Ox3   DM Foot Exam 06/13/2024 RBKA Left foot : Patient with a dried scab at the dorsal aspect of the left second toe, no pulses  Sensation absent      DATA REVIEWED:  Lab Results  Component Value Date   HGBA1C 8.4 (A) 03/21/2024   HGBA1C 9.5 (H) 12/04/2023   HGBA1C 8.6 (A) 08/01/2023    Latest Reference Range & Units 05/15/24 13:59  Sodium 134 - 144 mmol/L 142  Potassium 3.5 - 5.2 mmol/L 4.0  Chloride 96 - 106 mmol/L 105  CO2 20 - 29 mmol/L 21  Glucose 70 - 99 mg/dL 774 (H)  BUN 8 - 27 mg/dL 16  Creatinine 9.23 - 8.72 mg/dL 8.88  Calcium  8.6 - 10.2 mg/dL 9.7  BUN/Creatinine Ratio 10 - 24  14  eGFR >59 mL/min/1.73 69     Latest Reference Range & Units 12/04/23 10:00  Total CHOL/HDL Ratio <5.0 (calc) 2.6  Cholesterol <200 mg/dL 883  HDL Cholesterol > OR = 40 mg/dL 45  LDL Cholesterol (Calc) mg/dL (calc) 48  Non-HDL Cholesterol (Calc) <130 mg/dL (calc) 71  Triglycerides <150 mg/dL 852    Old records , labs and images have been reviewed.    ASSESSMENT / PLAN / RECOMMENDATIONS:   1) Type 2 Diabetes Mellitus, Poorly controlled, With Neuropathic ,macrovascular complications and microalbuminuria - Most recent A1c of 9.3%. Goal A1c < 7.0 %.    - Patient continues with persistent hyperglycemia.  He has not been to our clinic in 10 months, he has not been on pioglitazone  in approximately 2 months, patient states he needs a refill, pioglitazone  was prescribed in June 2025 for a year supply -He also tells me he is receiving messages from his new insurance, I did advise the patient that his pharmacy will contact us  directly to alleviate any kind of confusion -In reviewing CGM, patient has been  noted persistent postprandial hyperglycemia, I will increase his Humalog  as below -In the past he has opted to avoid insulin  pump technology, but his insurance company informed him that they would not be covering for CeQur anymore, he is interested in a psychologist, clinical.  I did entertain [employer], patient is interested in this, a prescription will be sent and a referral to our CDE has been placed -Patient has chronic diarrhea due to pancreatic insufficiency, this has not worsened while on Ozempic  nor Mounjaro  - We opted not to restart pioglitazone  due to lower extremity edema -I will increase Humalog  as below   MEDICATIONS:  Continue Mounjaro  15 received mg weekly Continue Jardiance  25 mg, 1 tablet daily Continue Toujeo  56 units daily  Increase Humalog  36 units with each meal  (18 clicks) Continue correction factor : Humalog  ( BG-130/25) TIDQAC   Thank you morning - I have been while patient is happening to you Dr. So so you are doing the Mounjaro  15 mg now ejection that you take once a week yes okay you prescription for that make you EDUCATION / INSTRUCTIONS: BG monitoring instructions: Patient is instructed to check his blood sugars 3 times a  day, before meals. Call [facility] if: BG persistently < 70  I reviewed the Rule of 15 for the treatment of hypoglycemia in detail with the patient. Literature supplied.   2) Diabetic complications:  Eye: Does not have known diabetic retinopathy.  Neuro/ Feet: Does  have known diabetic peripheral neuropathy. Renal: Patient does not have known baseline CKD. He is  on an ACEI/ARB at present.  3) Dyslipidemia :   - Triglyceride and LDL at goal  Medication  Continue atorvastatin  40 mg daily   4) PVD:  -I suspect his toe dry scab is due to peripheral vascular disease, patient encouraged to follow-up with vascular surgery   Follow-up in 3 months    Signed electronically by: Stefano Redgie Butts, MD  [facility]  Endocrinology  [facility] [address] [place] [place], [place] [address] Phone: [phone] FAX: [phone]   CC: Nelia Dirks, MD [address] [place], [address] [place] [address] Phone: [phone]  Fax: [phone]    Return to Endocrinology clinic as below: Future Appointments  Date Time Provider Department Center  06/13/2024  8:10 AM Finnbar Cedillos, Donell Redgie, MD [facility] None  06/23/2024  9:45 AM Fleeta Rothman, Jomarie SAILOR, MD [facility]  07/01/2024  2:15 PM Plyler, Arland HERO, RD [facility] [address]  07/28/2024 11:00 AM [facility] 10 [facility] H&V  07/28/2024 12:00 PM [facility] 10 [facility] H&V  07/28/2024 12:45 PM [facility] PA-2 [facility] H&V  09/01/2024  9:00 AM [facility] 1 [facility] H&V  09/01/2024  9:45 AM [facility] PA-2 [facility] H&V    "

## 2024-06-13 NOTE — Patient Instructions (Signed)
 Continue Mounjaro  15 mg weekly  Continue Jardiance  25 mg, 1 tablet every morning  daily  Continue Toujeo  ( Insulin  Glargine) 56  units every morning  Change HUmalog  18 clicks with each meal      HOW TO TREAT LOW BLOOD SUGARS (Blood sugar LESS THAN 70 MG/DL) Please follow the RULE OF 15 for the treatment of hypoglycemia treatment (when your (blood sugars are less than 70 mg/dL)   STEP 1: Take 15 grams of carbohydrates when your blood sugar is low, which includes:  3-4 GLUCOSE TABS  OR 3-4 OZ OF JUICE OR REGULAR SODA OR ONE TUBE OF GLUCOSE GEL    STEP 2: RECHECK blood sugar in 15 MINUTES STEP 3: If your blood sugar is still low at the 15 minute recheck --> then, go back to STEP 1 and treat AGAIN with another 15 grams of carbohydrates.

## 2024-06-23 ENCOUNTER — Ambulatory Visit: Payer: Self-pay | Admitting: Infectious Disease

## 2024-07-01 ENCOUNTER — Ambulatory Visit: Payer: Self-pay | Admitting: Dietician

## 2024-07-28 ENCOUNTER — Ambulatory Visit: Payer: Medicare (Managed Care)

## 2024-07-28 ENCOUNTER — Ambulatory Visit (HOSPITAL_COMMUNITY): Payer: Medicare (Managed Care)

## 2024-09-01 ENCOUNTER — Ambulatory Visit: Payer: Medicare (Managed Care)

## 2024-09-01 ENCOUNTER — Ambulatory Visit (HOSPITAL_COMMUNITY): Payer: Medicare (Managed Care)

## 2024-09-08 ENCOUNTER — Ambulatory Visit: Payer: Medicare (Managed Care) | Admitting: Internal Medicine
